# Patient Record
Sex: Male | Born: 1965 | Race: White | Hispanic: No | Marital: Single | Smoking: Current every day smoker
Health system: Southern US, Community
[De-identification: ages and names within clinical notes are randomized; demographics above are authoritative.]

## PROBLEM LIST (undated history)

## (undated) DIAGNOSIS — K469 Unspecified abdominal hernia without obstruction or gangrene: Secondary | ICD-10-CM

## (undated) DIAGNOSIS — Z720 Tobacco use: Secondary | ICD-10-CM

## (undated) DIAGNOSIS — K7290 Hepatic failure, unspecified without coma: Secondary | ICD-10-CM

## (undated) DIAGNOSIS — R569 Unspecified convulsions: Secondary | ICD-10-CM

## (undated) DIAGNOSIS — K7460 Unspecified cirrhosis of liver: Secondary | ICD-10-CM

## (undated) DIAGNOSIS — F32A Depression, unspecified: Secondary | ICD-10-CM

## (undated) DIAGNOSIS — F319 Bipolar disorder, unspecified: Secondary | ICD-10-CM

## (undated) DIAGNOSIS — F329 Major depressive disorder, single episode, unspecified: Secondary | ICD-10-CM

## (undated) DIAGNOSIS — I619 Nontraumatic intracerebral hemorrhage, unspecified: Secondary | ICD-10-CM

## (undated) DIAGNOSIS — I639 Cerebral infarction, unspecified: Secondary | ICD-10-CM

## (undated) HISTORY — PX: SPLENECTOMY: SUR1306

## (undated) HISTORY — DX: Hepatic failure, unspecified without coma: K72.90

## (undated) HISTORY — DX: Nontraumatic intracerebral hemorrhage, unspecified: I61.9

## (undated) HISTORY — PX: ANKLE SURGERY: SHX546

## (undated) HISTORY — PX: HERNIA REPAIR: SHX51

---

## 1998-03-25 ENCOUNTER — Emergency Department (HOSPITAL_COMMUNITY): Admission: EM | Admit: 1998-03-25 | Discharge: 1998-03-25 | Payer: Self-pay | Admitting: Emergency Medicine

## 1999-08-15 ENCOUNTER — Emergency Department (HOSPITAL_COMMUNITY): Admission: EM | Admit: 1999-08-15 | Discharge: 1999-08-15 | Payer: Self-pay

## 2001-12-13 ENCOUNTER — Emergency Department (HOSPITAL_COMMUNITY): Admission: EM | Admit: 2001-12-13 | Discharge: 2001-12-13 | Payer: Self-pay | Admitting: Emergency Medicine

## 2001-12-13 ENCOUNTER — Encounter: Payer: Self-pay | Admitting: Emergency Medicine

## 2003-06-23 ENCOUNTER — Encounter: Payer: Self-pay | Admitting: Surgery

## 2003-06-23 ENCOUNTER — Inpatient Hospital Stay (HOSPITAL_COMMUNITY): Admission: EM | Admit: 2003-06-23 | Discharge: 2003-06-26 | Payer: Self-pay | Admitting: Emergency Medicine

## 2003-06-23 ENCOUNTER — Encounter (INDEPENDENT_AMBULATORY_CARE_PROVIDER_SITE_OTHER): Payer: Self-pay | Admitting: *Deleted

## 2003-06-25 ENCOUNTER — Encounter: Payer: Self-pay | Admitting: Surgery

## 2003-07-24 ENCOUNTER — Encounter (INDEPENDENT_AMBULATORY_CARE_PROVIDER_SITE_OTHER): Payer: Self-pay | Admitting: *Deleted

## 2005-10-26 ENCOUNTER — Emergency Department (HOSPITAL_COMMUNITY): Admission: EM | Admit: 2005-10-26 | Discharge: 2005-10-26 | Payer: Self-pay | Admitting: Emergency Medicine

## 2005-11-04 ENCOUNTER — Ambulatory Visit (HOSPITAL_COMMUNITY): Admission: AD | Admit: 2005-11-04 | Discharge: 2005-11-06 | Payer: Self-pay | Admitting: Orthopedic Surgery

## 2009-03-21 ENCOUNTER — Emergency Department (HOSPITAL_COMMUNITY): Admission: EM | Admit: 2009-03-21 | Discharge: 2009-03-21 | Payer: Self-pay | Admitting: Emergency Medicine

## 2009-07-19 ENCOUNTER — Emergency Department (HOSPITAL_COMMUNITY): Admission: EM | Admit: 2009-07-19 | Discharge: 2009-07-20 | Payer: Self-pay | Admitting: Emergency Medicine

## 2009-08-19 ENCOUNTER — Inpatient Hospital Stay (HOSPITAL_COMMUNITY): Admission: EM | Admit: 2009-08-19 | Discharge: 2009-08-21 | Payer: Self-pay | Admitting: Emergency Medicine

## 2009-08-19 ENCOUNTER — Encounter (INDEPENDENT_AMBULATORY_CARE_PROVIDER_SITE_OTHER): Payer: Self-pay | Admitting: *Deleted

## 2009-08-22 ENCOUNTER — Ambulatory Visit: Payer: Self-pay | Admitting: Internal Medicine

## 2009-08-23 ENCOUNTER — Encounter (INDEPENDENT_AMBULATORY_CARE_PROVIDER_SITE_OTHER): Payer: Self-pay | Admitting: *Deleted

## 2009-08-23 ENCOUNTER — Inpatient Hospital Stay (HOSPITAL_COMMUNITY): Admission: EM | Admit: 2009-08-23 | Discharge: 2009-08-29 | Payer: Self-pay | Admitting: Emergency Medicine

## 2009-08-28 ENCOUNTER — Encounter (INDEPENDENT_AMBULATORY_CARE_PROVIDER_SITE_OTHER): Payer: Self-pay | Admitting: Surgery

## 2009-09-18 ENCOUNTER — Encounter: Payer: Self-pay | Admitting: Internal Medicine

## 2010-04-10 ENCOUNTER — Emergency Department (HOSPITAL_COMMUNITY): Admission: EM | Admit: 2010-04-10 | Discharge: 2010-04-10 | Payer: Self-pay | Admitting: Emergency Medicine

## 2010-04-22 ENCOUNTER — Emergency Department (HOSPITAL_COMMUNITY): Admission: EM | Admit: 2010-04-22 | Discharge: 2010-04-22 | Payer: Self-pay | Admitting: Emergency Medicine

## 2010-04-27 ENCOUNTER — Emergency Department (HOSPITAL_COMMUNITY): Admission: EM | Admit: 2010-04-27 | Discharge: 2010-04-27 | Payer: Self-pay | Admitting: Emergency Medicine

## 2010-06-01 ENCOUNTER — Emergency Department (HOSPITAL_COMMUNITY): Admission: EM | Admit: 2010-06-01 | Discharge: 2010-06-01 | Payer: Self-pay | Admitting: Emergency Medicine

## 2011-02-10 ENCOUNTER — Emergency Department (HOSPITAL_COMMUNITY)
Admission: EM | Admit: 2011-02-10 | Discharge: 2011-02-10 | Disposition: A | Discharge status: Home / Self Care | Payer: Self-pay | Attending: Emergency Medicine | Admitting: Emergency Medicine

## 2011-02-10 ENCOUNTER — Emergency Department (HOSPITAL_COMMUNITY): Payer: Self-pay

## 2011-02-10 DIAGNOSIS — S9030XA Contusion of unspecified foot, initial encounter: Secondary | ICD-10-CM | POA: Insufficient documentation

## 2011-02-10 DIAGNOSIS — W208XXA Other cause of strike by thrown, projected or falling object, initial encounter: Secondary | ICD-10-CM | POA: Insufficient documentation

## 2011-02-20 ENCOUNTER — Emergency Department (HOSPITAL_COMMUNITY)
Admission: EM | Admit: 2011-02-20 | Discharge: 2011-02-20 | Disposition: A | Discharge status: Home / Self Care | Payer: Self-pay | Attending: Emergency Medicine | Admitting: Emergency Medicine

## 2011-02-20 ENCOUNTER — Emergency Department (HOSPITAL_COMMUNITY): Payer: Self-pay

## 2011-02-20 DIAGNOSIS — R319 Hematuria, unspecified: Secondary | ICD-10-CM | POA: Insufficient documentation

## 2011-02-20 DIAGNOSIS — S20229A Contusion of unspecified back wall of thorax, initial encounter: Secondary | ICD-10-CM | POA: Insufficient documentation

## 2011-02-20 DIAGNOSIS — W108XXA Fall (on) (from) other stairs and steps, initial encounter: Secondary | ICD-10-CM | POA: Insufficient documentation

## 2011-02-20 LAB — URINE MICROSCOPIC-ADD ON

## 2011-02-20 LAB — BASIC METABOLIC PANEL
BUN: 5 mg/dL — ABNORMAL LOW (ref 6–23)
CO2: 25 mEq/L (ref 19–32)
Calcium: 9 mg/dL (ref 8.4–10.5)
Creatinine, Ser: 0.88 mg/dL (ref 0.4–1.5)
Glucose, Bld: 79 mg/dL (ref 70–99)
Potassium: 4 mEq/L (ref 3.5–5.1)

## 2011-02-20 LAB — URINALYSIS, ROUTINE W REFLEX MICROSCOPIC
Bilirubin Urine: NEGATIVE
Glucose, UA: NEGATIVE mg/dL
Nitrite: NEGATIVE
Specific Gravity, Urine: <1.005 — ABNORMAL LOW (ref 1.005–1.030)
pH: 6 (ref 5.0–8.0)

## 2011-02-20 LAB — DIFFERENTIAL
Basophils Absolute: 0.1 10*3/uL (ref 0.0–0.1)
Lymphocytes Relative: 20 % (ref 12–46)
Monocytes Absolute: 0.7 10*3/uL (ref 0.1–1.0)
Monocytes Relative: 8 % (ref 3–12)
Neutro Abs: 6.7 10*3/uL (ref 1.7–7.7)
Neutrophils Relative %: 71 % (ref 43–77)

## 2011-02-20 LAB — CBC
Hemoglobin: 16.6 g/dL (ref 13.0–17.0)
RDW: 14.9 % (ref 11.5–15.5)
WBC: 9.4 10*3/uL (ref 4.0–10.5)

## 2011-02-23 LAB — BASIC METABOLIC PANEL
BUN: 4 mg/dL — ABNORMAL LOW (ref 6–23)
CO2: 26 mEq/L (ref 19–32)
Calcium: 8.6 mg/dL (ref 8.4–10.5)
Chloride: 103 mEq/L (ref 96–112)
Creatinine, Ser: 0.81 mg/dL (ref 0.4–1.5)
Glucose, Bld: 92 mg/dL (ref 70–99)

## 2011-02-23 LAB — DIFFERENTIAL
Basophils Absolute: 0.1 10*3/uL (ref 0.0–0.1)
Basophils Relative: 1 % (ref 0–1)
Eosinophils Absolute: 0.1 10*3/uL (ref 0.0–0.7)
Eosinophils Relative: 1 % (ref 0–5)
Lymphs Abs: 2.5 10*3/uL (ref 0.7–4.0)
Neutrophils Relative %: 61 % (ref 43–77)

## 2011-02-23 LAB — CBC
MCH: 34.6 pg — ABNORMAL HIGH (ref 26.0–34.0)
MCHC: 34.1 g/dL (ref 30.0–36.0)
MCV: 101.4 fL — ABNORMAL HIGH (ref 78.0–100.0)
Platelets: 212 10*3/uL (ref 150–400)
RDW: 17.2 % — ABNORMAL HIGH (ref 11.5–15.5)

## 2011-02-23 LAB — POCT CARDIAC MARKERS: Troponin i, poc: <0.05 ng/mL (ref 0.00–0.09)

## 2011-02-23 LAB — D-DIMER, QUANTITATIVE: D-Dimer, Quant: 2.43 ug/mL-FEU — ABNORMAL HIGH (ref 0.00–0.48)

## 2011-02-24 LAB — URINALYSIS, ROUTINE W REFLEX MICROSCOPIC
Bilirubin Urine: NEGATIVE
Glucose, UA: NEGATIVE mg/dL
Nitrite: NEGATIVE
Specific Gravity, Urine: >1.03 — ABNORMAL HIGH (ref 1.005–1.030)
pH: 5.5 (ref 5.0–8.0)

## 2011-02-26 ENCOUNTER — Emergency Department (HOSPITAL_COMMUNITY): Payer: Self-pay

## 2011-02-26 ENCOUNTER — Emergency Department (HOSPITAL_COMMUNITY)
Admission: EM | Admit: 2011-02-26 | Discharge: 2011-02-26 | Disposition: A | Discharge status: Home / Self Care | Payer: Self-pay | Attending: Emergency Medicine | Admitting: Emergency Medicine

## 2011-02-26 DIAGNOSIS — R112 Nausea with vomiting, unspecified: Secondary | ICD-10-CM | POA: Insufficient documentation

## 2011-02-26 DIAGNOSIS — R319 Hematuria, unspecified: Secondary | ICD-10-CM | POA: Insufficient documentation

## 2011-02-26 DIAGNOSIS — R509 Fever, unspecified: Secondary | ICD-10-CM | POA: Insufficient documentation

## 2011-02-26 DIAGNOSIS — N390 Urinary tract infection, site not specified: Secondary | ICD-10-CM | POA: Insufficient documentation

## 2011-02-26 DIAGNOSIS — R109 Unspecified abdominal pain: Secondary | ICD-10-CM | POA: Insufficient documentation

## 2011-02-26 DIAGNOSIS — R748 Abnormal levels of other serum enzymes: Secondary | ICD-10-CM | POA: Insufficient documentation

## 2011-02-26 DIAGNOSIS — K7689 Other specified diseases of liver: Secondary | ICD-10-CM | POA: Insufficient documentation

## 2011-02-26 DIAGNOSIS — K5900 Constipation, unspecified: Secondary | ICD-10-CM | POA: Insufficient documentation

## 2011-02-26 LAB — DIFFERENTIAL
Eosinophils Absolute: 0 10*3/uL (ref 0.0–0.7)
Eosinophils Relative: 0 % (ref 0–5)
Lymphs Abs: 2.1 10*3/uL (ref 0.7–4.0)
Monocytes Relative: 11 % (ref 3–12)

## 2011-02-26 LAB — LIPASE, BLOOD: Lipase: 49 U/L (ref 11–59)

## 2011-02-26 LAB — CBC
MCH: 34.3 pg — ABNORMAL HIGH (ref 26.0–34.0)
MCHC: 35.6 g/dL (ref 30.0–36.0)
MCV: 96.4 fL (ref 78.0–100.0)
Platelets: 120 10*3/uL — ABNORMAL LOW (ref 150–400)
RDW: 14.2 % (ref 11.5–15.5)

## 2011-02-26 LAB — URINALYSIS, ROUTINE W REFLEX MICROSCOPIC
Glucose, UA: NEGATIVE mg/dL
Protein, ur: 30 mg/dL — AB
Specific Gravity, Urine: 1.028 (ref 1.005–1.030)
Urobilinogen, UA: 1 mg/dL (ref 0.0–1.0)

## 2011-02-26 LAB — COMPREHENSIVE METABOLIC PANEL
Albumin: 3.3 g/dL — ABNORMAL LOW (ref 3.5–5.2)
BUN: 8 mg/dL (ref 6–23)
Creatinine, Ser: 0.84 mg/dL (ref 0.4–1.5)
Total Bilirubin: 1.6 mg/dL — ABNORMAL HIGH (ref 0.3–1.2)
Total Protein: 6.5 g/dL (ref 6.0–8.3)

## 2011-02-26 LAB — URINE MICROSCOPIC-ADD ON

## 2011-02-26 MED ORDER — IOHEXOL 300 MG/ML  SOLN
80.0000 mL | Freq: Once | INTRAMUSCULAR | Status: AC | PRN
  Administered 2011-02-26: 80 mL via INTRAVENOUS

## 2011-02-27 LAB — URINE CULTURE
Colony Count: 4000
Culture  Setup Time: 201203211123

## 2011-03-14 LAB — COMPREHENSIVE METABOLIC PANEL
ALT: 17 U/L (ref 0–53)
AST: 22 U/L (ref 0–37)
AST: 29 U/L (ref 0–37)
Albumin: 3.9 g/dL (ref 3.5–5.2)
Alkaline Phosphatase: 77 U/L (ref 39–117)
BUN: 11 mg/dL (ref 6–23)
CO2: 24 mEq/L (ref 19–32)
Calcium: 8.8 mg/dL (ref 8.4–10.5)
Calcium: 9.2 mg/dL (ref 8.4–10.5)
Chloride: 97 mEq/L (ref 96–112)
Chloride: 99 mEq/L (ref 96–112)
Creatinine, Ser: 0.91 mg/dL (ref 0.4–1.5)
GFR calc Af Amer: >60 mL/min (ref >60)
GFR calc Af Amer: >60 mL/min (ref >60)
GFR calc non Af Amer: >60 mL/min (ref >60)
Glucose, Bld: 88 mg/dL (ref 70–99)
Potassium: 3.4 mEq/L — ABNORMAL LOW (ref 3.5–5.1)
Sodium: 131 mEq/L — ABNORMAL LOW (ref 135–145)
Total Protein: 7.4 g/dL (ref 6.0–8.3)

## 2011-03-14 LAB — DIFFERENTIAL
Basophils Absolute: 0 10*3/uL (ref 0.0–0.1)
Basophils Absolute: 0 10*3/uL (ref 0.0–0.1)
Basophils Absolute: 0 10*3/uL (ref 0.0–0.1)
Basophils Relative: 0 % (ref 0–1)
Eosinophils Absolute: 0 10*3/uL (ref 0.0–0.7)
Eosinophils Absolute: 0 10*3/uL (ref 0.0–0.7)
Eosinophils Relative: 0 % (ref 0–5)
Eosinophils Relative: 0 % (ref 0–5)
Eosinophils Relative: 0 % (ref 0–5)
Lymphocytes Relative: 12 % (ref 12–46)
Lymphocytes Relative: 2 % — ABNORMAL LOW (ref 12–46)
Lymphocytes Relative: 5 % — ABNORMAL LOW (ref 12–46)
Lymphs Abs: 2.1 10*3/uL (ref 0.7–4.0)
Lymphs Abs: 0.5 10*3/uL — ABNORMAL LOW (ref 0.7–4.0)
Lymphs Abs: 1.1 10*3/uL (ref 0.7–4.0)
Monocytes Absolute: 0.5 10*3/uL (ref 0.1–1.0)
Monocytes Absolute: 1 10*3/uL (ref 0.1–1.0)
Monocytes Relative: 16 % — ABNORMAL HIGH (ref 3–12)
Monocytes Relative: 11 % (ref 3–12)
Monocytes Relative: 5 % (ref 3–12)
Monocytes Relative: 5 % (ref 3–12)
Neutro Abs: 10.3 10*3/uL — ABNORMAL HIGH (ref 1.7–7.7)
Neutro Abs: 18.3 10*3/uL — ABNORMAL HIGH (ref 1.7–7.7)
Neutrophils Relative %: 70 % (ref 43–77)
Neutrophils Relative %: 92 % — ABNORMAL HIGH (ref 43–77)

## 2011-03-14 LAB — CBC
HCT: 18.7 % — ABNORMAL LOW (ref 39.0–52.0)
HCT: 19.4 % — ABNORMAL LOW (ref 39.0–52.0)
HCT: 21 % — ABNORMAL LOW (ref 39.0–52.0)
HCT: 21.3 % — ABNORMAL LOW (ref 39.0–52.0)
HCT: 21.6 % — ABNORMAL LOW (ref 39.0–52.0)
HCT: 46.8 % (ref 39.0–52.0)
HCT: 47 % (ref 39.0–52.0)
Hemoglobin: 6.5 g/dL — CL (ref 13.0–17.0)
Hemoglobin: 6.8 g/dL — CL (ref 13.0–17.0)
Hemoglobin: 7 g/dL — CL (ref 13.0–17.0)
Hemoglobin: 7.2 g/dL — CL (ref 13.0–17.0)
Hemoglobin: 8.9 g/dL — ABNORMAL LOW (ref 13.0–17.0)
Hemoglobin: 14.7 g/dL (ref 13.0–17.0)
MCHC: 33.8 g/dL (ref 30.0–36.0)
MCHC: 34.3 g/dL (ref 30.0–36.0)
MCHC: 34.7 g/dL (ref 30.0–36.0)
MCHC: 34.8 g/dL (ref 30.0–36.0)
MCHC: 34.8 g/dL (ref 30.0–36.0)
MCV: 93.7 fL (ref 78.0–100.0)
MCV: 94.3 fL (ref 78.0–100.0)
MCV: 94.8 fL (ref 78.0–100.0)
MCV: 95 fL (ref 78.0–100.0)
MCV: 96.4 fL (ref 78.0–100.0)
MCV: 97 fL (ref 78.0–100.0)
MCV: 95.4 fL (ref 78.0–100.0)
Platelets: 123 10*3/uL — ABNORMAL LOW (ref 150–400)
Platelets: 162 10*3/uL (ref 150–400)
Platelets: 241 10*3/uL (ref 150–400)
Platelets: 275 10*3/uL (ref 150–400)
Platelets: 326 10*3/uL (ref 150–400)
Platelets: 646 10*3/uL — ABNORMAL HIGH (ref 150–400)
Platelets: 75 10*3/uL — ABNORMAL LOW (ref 150–400)
Platelets: 95 10*3/uL — ABNORMAL LOW (ref 150–400)
RBC: 2.08 MIL/uL — ABNORMAL LOW (ref 4.22–5.81)
RBC: 2.24 MIL/uL — ABNORMAL LOW (ref 4.22–5.81)
RBC: 2.26 MIL/uL — ABNORMAL LOW (ref 4.22–5.81)
RBC: 2.7 MIL/uL — ABNORMAL LOW (ref 4.22–5.81)
RBC: 4.37 MIL/uL (ref 4.22–5.81)
RBC: 4.84 MIL/uL (ref 4.22–5.81)
RDW: 13.9 % (ref 11.5–15.5)
RDW: 14.1 % (ref 11.5–15.5)
RDW: 14.5 % (ref 11.5–15.5)
RDW: 14.2 % (ref 11.5–15.5)
WBC: 13.1 10*3/uL — ABNORMAL HIGH (ref 4.0–10.5)
WBC: 15.2 10*3/uL — ABNORMAL HIGH (ref 4.0–10.5)
WBC: 15.7 10*3/uL — ABNORMAL HIGH (ref 4.0–10.5)
WBC: 19.4 10*3/uL — ABNORMAL HIGH (ref 4.0–10.5)
WBC: 19.9 10*3/uL — ABNORMAL HIGH (ref 4.0–10.5)
WBC: 20.4 10*3/uL — ABNORMAL HIGH (ref 4.0–10.5)
WBC: 11.4 10*3/uL — ABNORMAL HIGH (ref 4.0–10.5)
WBC: 8.8 10*3/uL (ref 4.0–10.5)

## 2011-03-14 LAB — CULTURE, BLOOD (ROUTINE X 2): Culture: NO GROWTH

## 2011-03-14 LAB — BASIC METABOLIC PANEL
BUN: 5 mg/dL — ABNORMAL LOW (ref 6–23)
CO2: 21 mEq/L (ref 19–32)
Chloride: 105 mEq/L (ref 96–112)
Chloride: 106 mEq/L (ref 96–112)
Chloride: 98 mEq/L (ref 96–112)
GFR calc Af Amer: >60 mL/min (ref >60)
GFR calc Af Amer: >60 mL/min (ref >60)
Potassium: 3.7 mEq/L (ref 3.5–5.1)
Potassium: 4.4 mEq/L (ref 3.5–5.1)
Sodium: 132 mEq/L — ABNORMAL LOW (ref 135–145)
Sodium: 135 mEq/L (ref 135–145)

## 2011-03-14 LAB — PROTIME-INR
INR: 1.1 (ref 0.00–1.49)
Prothrombin Time: 13.6 seconds (ref 11.6–15.2)
Prothrombin Time: 14.2 seconds (ref 11.6–15.2)

## 2011-03-14 LAB — CROSSMATCH: Antibody Screen: NEGATIVE

## 2011-03-14 LAB — LIPID PANEL
Triglycerides: 59 mg/dL (ref <150)
VLDL: 12 mg/dL (ref 0–40)

## 2011-03-14 LAB — POCT I-STAT, CHEM 8
BUN: 12 mg/dL (ref 6–23)
Calcium, Ion: 1.11 mmol/L — ABNORMAL LOW (ref 1.12–1.32)
Chloride: 100 mEq/L (ref 96–112)
Creatinine, Ser: 1 mg/dL (ref 0.4–1.5)
Creatinine, Ser: 1.1 mg/dL (ref 0.4–1.5)
Glucose, Bld: 125 mg/dL — ABNORMAL HIGH (ref 70–99)
Glucose, Bld: 135 mg/dL — ABNORMAL HIGH (ref 70–99)
HCT: 52 % (ref 39.0–52.0)
Hemoglobin: 15.3 g/dL (ref 13.0–17.0)
Hemoglobin: 17.7 g/dL — ABNORMAL HIGH (ref 13.0–17.0)
Potassium: 3.7 mEq/L (ref 3.5–5.1)
Potassium: 3.9 meq/L (ref 3.5–5.1)
Sodium: 133 meq/L — ABNORMAL LOW (ref 135–145)
TCO2: 23 mmol/L (ref 0–100)

## 2011-03-14 LAB — URINALYSIS, ROUTINE W REFLEX MICROSCOPIC
Bilirubin Urine: NEGATIVE
Glucose, UA: NEGATIVE mg/dL
Ketones, ur: 15 mg/dL — AB
Nitrite: NEGATIVE
Nitrite: POSITIVE — AB
Protein, ur: NEGATIVE mg/dL
Specific Gravity, Urine: 1.035 — ABNORMAL HIGH (ref 1.005–1.030)
pH: 5.5 (ref 5.0–8.0)

## 2011-03-14 LAB — HEPATIC FUNCTION PANEL
ALT: 17 U/L (ref 0–53)
AST: 21 U/L (ref 0–37)
Bilirubin, Direct: 0.3 mg/dL (ref 0.0–0.3)
Total Bilirubin: 0.9 mg/dL (ref 0.3–1.2)

## 2011-03-14 LAB — URINE CULTURE: Culture: NO GROWTH

## 2011-03-14 LAB — POTASSIUM: Potassium: 4.1 mEq/L (ref 3.5–5.1)

## 2011-03-14 LAB — APTT: aPTT: 28 seconds (ref 24–37)

## 2011-03-14 LAB — URINE MICROSCOPIC-ADD ON

## 2011-03-14 LAB — PHOSPHORUS: Phosphorus: 3.7 mg/dL (ref 2.3–4.6)

## 2011-03-14 LAB — HEMOGLOBIN AND HEMATOCRIT, BLOOD: HCT: 21 % — ABNORMAL LOW (ref 39.0–52.0)

## 2011-03-19 LAB — URINALYSIS, ROUTINE W REFLEX MICROSCOPIC
Glucose, UA: NEGATIVE mg/dL
Hgb urine dipstick: NEGATIVE
Ketones, ur: 40 mg/dL — AB
Protein, ur: NEGATIVE mg/dL

## 2011-03-27 ENCOUNTER — Emergency Department (HOSPITAL_COMMUNITY): Payer: Self-pay

## 2011-03-27 ENCOUNTER — Emergency Department (HOSPITAL_COMMUNITY)
Admission: EM | Admit: 2011-03-27 | Discharge: 2011-03-27 | Disposition: A | Discharge status: Home / Self Care | Payer: Self-pay | Attending: Emergency Medicine | Admitting: Emergency Medicine

## 2011-03-27 DIAGNOSIS — K7460 Unspecified cirrhosis of liver: Secondary | ICD-10-CM | POA: Insufficient documentation

## 2011-03-27 DIAGNOSIS — Z87442 Personal history of urinary calculi: Secondary | ICD-10-CM | POA: Insufficient documentation

## 2011-03-27 DIAGNOSIS — F1020 Alcohol dependence, uncomplicated: Secondary | ICD-10-CM | POA: Insufficient documentation

## 2011-03-27 DIAGNOSIS — R5381 Other malaise: Secondary | ICD-10-CM | POA: Insufficient documentation

## 2011-03-27 LAB — CBC
HCT: 49.3 % (ref 39.0–52.0)
MCH: 34.9 pg — ABNORMAL HIGH (ref 26.0–34.0)
MCHC: 35.9 g/dL (ref 30.0–36.0)
MCV: 97.2 fL (ref 78.0–100.0)
RDW: 15.2 % (ref 11.5–15.5)

## 2011-03-27 LAB — BASIC METABOLIC PANEL
BUN: 4 mg/dL — ABNORMAL LOW (ref 6–23)
Calcium: 8.7 mg/dL (ref 8.4–10.5)
Creatinine, Ser: 0.77 mg/dL (ref 0.4–1.5)
GFR calc non Af Amer: >60 mL/min (ref >60)
Glucose, Bld: 109 mg/dL — ABNORMAL HIGH (ref 70–99)

## 2011-04-03 ENCOUNTER — Ambulatory Visit (HOSPITAL_COMMUNITY): Payer: Self-pay

## 2011-04-08 ENCOUNTER — Ambulatory Visit (HOSPITAL_COMMUNITY)
Admission: RE | Admit: 2011-04-08 | Discharge: 2011-04-08 | Disposition: A | Discharge status: Home / Self Care | Payer: Self-pay | Source: Ambulatory Visit | Attending: Neurology | Admitting: Neurology

## 2011-04-16 ENCOUNTER — Ambulatory Visit (HOSPITAL_COMMUNITY)
Admission: RE | Admit: 2011-04-16 | Discharge: 2011-04-16 | Disposition: A | Discharge status: Home / Self Care | Payer: Self-pay | Source: Ambulatory Visit | Attending: Neurology | Admitting: Neurology

## 2011-04-16 DIAGNOSIS — M6281 Muscle weakness (generalized): Secondary | ICD-10-CM | POA: Insufficient documentation

## 2011-04-16 DIAGNOSIS — R4789 Other speech disturbances: Secondary | ICD-10-CM | POA: Insufficient documentation

## 2011-04-16 DIAGNOSIS — Z1389 Encounter for screening for other disorder: Secondary | ICD-10-CM | POA: Insufficient documentation

## 2011-04-16 DIAGNOSIS — R404 Transient alteration of awareness: Secondary | ICD-10-CM | POA: Insufficient documentation

## 2011-04-17 NOTE — Procedures (Signed)
NAME:  Nathaniel Warren, Nathaniel Warren               ACCOUNT NO.:  [id number]  MEDICAL RECORD NO.:  [id number]           PATIENT TYPE:  O  LOCATION:  EE                           FACILITY:  [facility]  PHYSICIAN:  Vinayak Bobier A. Gerilyn Pilgrim, M.D. DATE OF BIRTH:  [birth year]  DATE OF PROCEDURE: DATE OF DISCHARGE:                             EEG INTERPRETATION   REFERRING PHYSICIAN:  Romel Dumond A. Athanasios Heldman, MD  INDICATIONS:  A 45 year old man who presents with spells of right-sided weakness, slurred speech and altered mentation.  This study is being done to evaluate for seizures.  MEDICATIONS:  None.  ANALYSIS:  A 16-channel recording is conducted for 21 minutes.  There is a well-formed posterior dominant rhythm of 12 Hz which attenuates with eye opening.  There is only awake activities observed.  There is beta activity observed in the frontal areas.  Photic stimulation and hypoventilation are carried out without abnormal changes in the background activity.  There is no focal or lateralized slowing.  There is no epileptiform activity observed.  IMPRESSION:  Normal recording in awake state.     Fischer Halley A. Gerilyn Pilgrim, M.D.     KAD/MEDQ  D:  04/17/2011  T:  04/17/2011  Job:  [id number]

## 2011-04-19 ENCOUNTER — Emergency Department (HOSPITAL_COMMUNITY)
Admission: EM | Admit: 2011-04-19 | Discharge: 2011-04-19 | Disposition: A | Discharge status: Home / Self Care | Payer: Self-pay | Attending: Emergency Medicine | Admitting: Emergency Medicine

## 2011-04-19 DIAGNOSIS — IMO0002 Reserved for concepts with insufficient information to code with codable children: Secondary | ICD-10-CM | POA: Insufficient documentation

## 2011-04-19 DIAGNOSIS — K7030 Alcoholic cirrhosis of liver without ascites: Secondary | ICD-10-CM | POA: Insufficient documentation

## 2011-04-19 DIAGNOSIS — F17200 Nicotine dependence, unspecified, uncomplicated: Secondary | ICD-10-CM | POA: Insufficient documentation

## 2011-04-19 DIAGNOSIS — F1020 Alcohol dependence, uncomplicated: Secondary | ICD-10-CM | POA: Insufficient documentation

## 2011-04-20 ENCOUNTER — Ambulatory Visit (HOSPITAL_COMMUNITY)
Admit: 2011-04-20 | Discharge: 2011-04-20 | Disposition: A | Discharge status: Home / Self Care | Payer: Self-pay | Source: Ambulatory Visit | Attending: Emergency Medicine | Admitting: Emergency Medicine

## 2011-04-20 DIAGNOSIS — M7989 Other specified soft tissue disorders: Secondary | ICD-10-CM | POA: Insufficient documentation

## 2011-04-20 DIAGNOSIS — M79609 Pain in unspecified limb: Secondary | ICD-10-CM | POA: Insufficient documentation

## 2011-04-20 DIAGNOSIS — I808 Phlebitis and thrombophlebitis of other sites: Secondary | ICD-10-CM | POA: Insufficient documentation

## 2011-04-22 ENCOUNTER — Emergency Department (HOSPITAL_COMMUNITY): Payer: Self-pay

## 2011-04-22 ENCOUNTER — Emergency Department (HOSPITAL_COMMUNITY)
Admission: EM | Admit: 2011-04-22 | Discharge: 2011-04-22 | Disposition: A | Discharge status: Home / Self Care | Payer: Self-pay | Attending: Emergency Medicine | Admitting: Emergency Medicine

## 2011-04-22 DIAGNOSIS — R5381 Other malaise: Secondary | ICD-10-CM | POA: Insufficient documentation

## 2011-04-22 DIAGNOSIS — R569 Unspecified convulsions: Secondary | ICD-10-CM | POA: Insufficient documentation

## 2011-04-22 DIAGNOSIS — K7460 Unspecified cirrhosis of liver: Secondary | ICD-10-CM | POA: Insufficient documentation

## 2011-04-22 DIAGNOSIS — F10229 Alcohol dependence with intoxication, unspecified: Secondary | ICD-10-CM | POA: Insufficient documentation

## 2011-04-22 DIAGNOSIS — Z79899 Other long term (current) drug therapy: Secondary | ICD-10-CM | POA: Insufficient documentation

## 2011-04-22 DIAGNOSIS — Z7982 Long term (current) use of aspirin: Secondary | ICD-10-CM | POA: Insufficient documentation

## 2011-04-22 DIAGNOSIS — R5383 Other fatigue: Secondary | ICD-10-CM | POA: Insufficient documentation

## 2011-04-22 LAB — HEPATIC FUNCTION PANEL
Bilirubin, Direct: 0.2 mg/dL (ref 0.0–0.3)
Indirect Bilirubin: 0.2 mg/dL — ABNORMAL LOW (ref 0.3–0.9)

## 2011-04-22 LAB — BASIC METABOLIC PANEL
BUN: 4 mg/dL — ABNORMAL LOW (ref 6–23)
Calcium: 9.7 mg/dL (ref 8.4–10.5)
GFR calc non Af Amer: >60 mL/min (ref >60)
Glucose, Bld: 100 mg/dL — ABNORMAL HIGH (ref 70–99)
Sodium: 137 mEq/L (ref 135–145)

## 2011-04-22 LAB — CBC
HCT: 50.5 % (ref 39.0–52.0)
Hemoglobin: 17.9 g/dL — ABNORMAL HIGH (ref 13.0–17.0)
RBC: 5.08 MIL/uL (ref 4.22–5.81)
WBC: 9 10*3/uL (ref 4.0–10.5)

## 2011-04-22 LAB — DIFFERENTIAL
Basophils Absolute: 0 10*3/uL (ref 0.0–0.1)
Lymphocytes Relative: 39 % (ref 12–46)
Neutro Abs: 4.7 10*3/uL (ref 1.7–7.7)

## 2011-04-22 LAB — RAPID URINE DRUG SCREEN, HOSP PERFORMED
Amphetamines: NOT DETECTED
Cocaine: NOT DETECTED
Opiates: NOT DETECTED
Tetrahydrocannabinol: NOT DETECTED

## 2011-04-22 LAB — ETHANOL: Alcohol, Ethyl (B): 320 mg/dL — ABNORMAL HIGH (ref 0–10)

## 2011-04-25 NOTE — Discharge Summary (Signed)
   NAMEBRODERIC, BARA NO.:  [id number]   MEDICAL RECORD NO.:  [id number]                   PATIENT TYPE:  INP   LOCATION:  0468                                 FACILITY:  [facility]   PHYSICIAN:  Sandria Bales. Ezzard Standing, M.D.               DATE OF BIRTH:  Dec 06, 1966   DATE OF ADMISSION:  06/23/2003  DATE OF DISCHARGE:  06/26/2003                                 DISCHARGE SUMMARY   No dictation for this job.                                               Sandria Bales. Ezzard Standing, M.D.    DHN/MEDQ  D:  06/26/2003  T:  06/26/2003  Job:  [id number]

## 2011-04-25 NOTE — H&P (Signed)
Nathaniel Warren, Nathaniel [name]:  [id number]   MEDICAL RECORD NO.:  [id number]                   PATIENT TYPE:  INP   LOCATION:  0468                                 FACILITY:  [facility]   PHYSICIAN:  Sandria Bales. Ezzard Standing, M.D.               DATE OF BIRTH:  Mar 13, 1966   DATE OF ADMISSION:  06/23/2003  DATE OF DISCHARGE:                                HISTORY & PHYSICAL   HISTORY OF PRESENT ILLNESS:  This is a 45 year old white male who has no  identified primary medical doctor.  He was in his normal state of health  last night when he ate some Hamburger Helper and some other food and started  feeling bloated.  He has had increase in bloating, had abdominal discomfort.  This morning he went to [facility].  He has had no nausea or  vomiting.  He says he had a normal bowel movement this morning.  He has not  been febrile.  He denies any prior history of peptic ulcer disease, liver  disease, pancreatic disease, or colon disease.  He has had no prior  abdominal surgery except for a right inguinal hernia repair at the age of  nine months.   ALLERGIES:  ASPIRIN.   CURRENT MEDICATIONS:  None.   REVIEW OF SYSTEMS:  NEUROLOGICAL:  No history of loss of consciousness.  PULMONARY:  He smokes 1 to 1-1/2 packs a day.  He knows it is bad for his  health.  He has no history of pneumonia.  CARDIAC:  No history of heart  disease, chest pain, or hypertension.  GASTROINTESTINAL:  See history of  present illness.  GENITOURINARY:  No significant kidney infections.  NECK:  He did, about 2-1/2 years ago, have trouble with his hyoid bone in his  right neck.  He was repeatedly treated with antibiotics about 3-4 times,  then got some gastrointestinal upset after this, and he associated his GI  upset with the several rounds of antibiotics.  He never did require any  surgery.   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  His wife is  flying in tonight to [employer] so she is not  here.   PHYSICAL EXAMINATION:  VITAL SIGNS:  Temperature 99.2, blood pressure  143/94, pulse 105, respirations 20.  GENERAL:  He is a well-nourished white male, alert and cooperative on  physical examination.  HEENT:  Unremarkable.  His pupils are equal, round, and reactive to light.  His mouth shows no obvious oral lesions.  NECK:  Supple.  No masses or thyromegaly.  LUNGS:  Clear to auscultation.  He has pretty good breath sounds.  He  actually has pretty good expiratory effort.  HEART:  Regular rate and rhythm.  I hear no murmur or rub.  ABDOMEN:  Shows decreased, near absent, bowel sounds.  He is moderately  distended.  He is bloated, but he did not really have any tenderness or  rebound though he says he is uncomfortable.  I do not feel any hernia, do  not feel any organomegaly or abdominal mass.  GENITOURINARY:  Unremarkable.  EXTREMITIES:  Good strength in all four extremities.  NEUROLOGICAL:  Grossly intact.   LABORATORIES:  The laboratories that I have at the time of this dictation  show white blood count 18,000, hemoglobin 16.9, hematocrit 48, platelet  count 163,000.  Sodium 139, potassium 4.2, chloride 107, CO2 23, glucose  123.  Alkaline phosphatase 93, amylase 265, lipase 186.   IMPRESSION:  My impression is that, with his mildly elevated lipase and  amylase, I wonder if Mr. Mika is not having some mild to moderate  pancreatitis.  He did drink some beers last night.  He has no history of  gallstones.  So, my impression is that of possible pancreatitis.   PLAN:  Obtain CT scan of the abdomen and pelvis, admit him, keep him n.p.o.,  and repeat his laboratories.  I have discussed with him my findings that, if  he has pancreatitis, the potential cause primarily would be the gallstones  or alcohol intake.  He may need further diagnostic testing if the CT scan is  incomplete.  I think he understands all this.  He is going  to try to arrange  to have his wife picked up tonight.                                               Sandria Bales. Ezzard Standing, M.D.    DHN/MEDQ  D:  06/23/2003  T:  06/23/2003  Job:  [id number]   cc:   Aneta Mins B. Land, P.A.-C  [facility]

## 2011-04-25 NOTE — Op Note (Signed)
NAMEJAYLEEN, AFONSO NO.:  [id number]   MEDICAL RECORD NO.:  [id number]          PATIENT TYPE:  INP   LOCATION:  0098                         FACILITY:  [facility]   PHYSICIAN:  Almedia Balls. Ranell Patrick, M.D. DATE OF BIRTH:  07/07/1966   DATE OF PROCEDURE:  11/04/2005  DATE OF DISCHARGE:                                 OPERATIVE REPORT   PREOPERATIVE DIAGNOSES:  Right ankle bimalleolar ankle fracture.   POSTOPERATIVE DIAGNOSES:  Right ankle bimalleolar ankle fracture.   PROCEDURE:  Open reduction and internal fixation of right bimalleolar ankle  fracture.   SURGEON:  Almedia Balls. Ranell Patrick, M.D.   FIRST ASSISTANT:  Alphonsa Overall, P.A.-C.   ANESTHESIA:  General anesthesia was used.   ESTIMATED BLOOD LOSS:  Minimal.   TOURNIQUET TIME:  1 hour and 20 minutes.   INSTRUMENT COUNTS:  Correct.   COMPLICATIONS:  None.   ANTIBIOTICS:  Perioperative antibiotics were given.   INDICATIONS FOR PROCEDURE:  The patient is a 45 year old male with a history  of a fall on a trampoline twisting his right ankle. The patient presented at  [facility] emergency room with a grossly unstable right ankle. The patient  had an obvious bimalleolar ankle fracture dislocation requiring urgent  reduction under an ankle block. The patient now presents after two weeks of  watching his skin. He developed significant swelling preventing surgery and  blisters. Once the blisters had resolved and the swelling was down, we were  able to safely bring him to surgery which is today. Informed consent was  obtained.   DESCRIPTION OF PROCEDURE:  After an adequate level of anesthesia was  achieved, the patient was positioned supinely on the operating room table. A  mini fluoroscopy unit was utilized during the surgery. After sterile prep  and drape of the right leg, we exsanguinated the limb using an esmarch  bandage and elevated the tourniquet which was on the right proximal thigh to  300 mmHg. A longitudinal  skin incision was created over the subcutaneous  fibula, dissection was carried sharply down through the subcutaneous tissues  to the bone in a single layer creating nice full thickness soft tissue  flaps. The fracture site was identified and curetted free of any organizing  hematoma. We identified the anterior joint and placed a retractor in the  anterior joint so we could see the anterolateral gutter and clean that out  of any debris and organizing hematoma and flushed the ankle joint out very  well. We were able to easily reduce the fracture without undue tension and  placed a crab claw clamp on that. This was an oblique fracture that yielded  two anterior to posterior lag screws. Once those screws were in place with  anatomic reduction, it was verified under multiplanar fluoroscopy. We placed  a lateral antiglide plate with two fully threaded cancellous in the distal  fragment and three bicortical screws in the proximal fragment. Once the  lateral side was done, we thoroughly irrigated and closed using interrupted  2-0 Vicryl suture followed by staples. We then placed our attention towards  the medial  aspect of the ankle joint where we made a curvilinear skin  incision, identified the saphenous vein and protected that. We then  identified the fracture site again cleaning periosteum and soft tissue out  of the fracture site. Extensive soft tissue __________ was identified and it  was removed. We next identified the anterior aspect of the ankle joint and  the anterior medial gutter and cleaned that of all organizing hematoma and  flushed the joint real well. The cartilage looked to be in good condition on  the medial portion of the talus. Possibly one small scuff on that from the  injury but otherwise looked pretty good. We then reduced the fracture,  placed two K wires to provisionally hold the medial malleolus in place and  were very happy with the reduction on AP and lateral views. We  then placed  two 4.0 partially threaded cancellous screws gaining excellent purchase and  good compression. The final x-rays were obtained. We thoroughly irrigated  the medial side wound and then closed it using interrupted 2-0 Vicryl suture  followed by staples for the skin. The patient tolerated surgery well and was  taken to the recovery room in stable condition.           ______________________________  Almedia Balls Ranell Patrick, M.D.     SRN/MEDQ  D:  11/04/2005  T:  11/05/2005  Job:  [id number]

## 2011-04-25 NOTE — Discharge Summary (Signed)
NAMEQUINLAN, [name]:  [id number]   MEDICAL RECORD NO.:  [id number]          PATIENT TYPE:  INP   LOCATION:  1507                         FACILITY:  [facility]   PHYSICIAN:  Almedia Balls. Ranell Patrick, M.D. DATE OF BIRTH:  22-Oct-1966   DATE OF ADMISSION:  11/04/2005  DATE OF DISCHARGE:  11/06/2005                                 DISCHARGE SUMMARY   ADMISSION DIAGNOSIS:  Right bimalleolar ankle fracture.   DISCHARGE DIAGNOSIS:  Right bimalleolar ankle fracture status post open  reduction internal fixation.   BRIEF HISTORY:  The patient is a 45 year old male that was jumping on a  trampoline and fell.  He sustained a bimalleolar ankle fracture.  It was too  swollen to complete surgery on the date of injury.  Thus we waited a week  for the swelling to recede.  He comes in for his procedure currently.   PROCEDURE:  The patient had a right ankle ORIF for a bimalleolar fracture on  November 04, 2005.   ATTENDING SURGEON:  Malon Kindle, M.D.   ASSISTANT:  Standley Dakins P.A.-C.   ESTIMATED BLOOD LOSS:  Minimal.   TOURNIQUET TIME:  One hour and 57 minutes.   COMPLICATIONS:  No complications and peri-operative antibiotics were given.   HOSPITAL COURSE:  The patient was admitted for the above stated procedure on  October 27, 2005.  The patient tolerated the procedure well and after  adequate time in the post-anesthesia care unit, he was transferred up to the  orthopedic floor.  On postoperative day one the patient was complaining  about moderate pain to that right ankle.  Thus it was determined that  discharge would not be prudent.  Thus he was allowed to stay in the  hospital, work with physical therapy, and undergo some pain management with  IV medications.  On postoperative day two the patient was feeling much  better.  He states that he is ready to go home.  We will see the patient  back in about two weeks in the office.  He is to be non-weightbearing on  that right  leg.   ACTIVITY:  Non-weightbearing right leg with crutches.   DISCHARGE MEDICATIONS:  The patient is to be discharged home on:  1.  Percocet 5/325 one tablet q4-6h. p.r.n. pain.  2.  Robaxin 500 mg one tablet q.6h. p.r.n. spasm.   FOLLOW UP:  The patient is to follow up with Dr. Malon Kindle in two weeks.   DIET:  Regular.   CONDITION:  Stable.   ALLERGIES:  NO KNOWN DRUG ALLERGIES.      Thomas B. Dixon, P.A.    ______________________________  Almedia Balls. Ranell Patrick, M.D.    TBD/MEDQ  D:  11/06/2005  T:  11/06/2005  Job:  [id number]

## 2011-04-25 NOTE — Discharge Summary (Signed)
   NAMELENIX, KIDD NO.:  [id number]   MEDICAL RECORD NO.:  [id number]                   PATIENT TYPE:  INP   LOCATION:  0468                                 FACILITY:  [facility]   PHYSICIAN:  Sandria Bales. Ezzard Standing, M.D.               DATE OF BIRTH:  [birth year]   DATE OF ADMISSION:  06/23/2003  DATE OF DISCHARGE:  06/26/2003                                 DISCHARGE SUMMARY   This is the second time this has been dictated.   DISCHARGE DIAGNOSES:  1. Pancreatitis.  2. History of smoking.   OPERATIONS PERFORMED:  None.   HISTORY OF ILLNESS:  The patient is a 45 year old white male who has no  primary medical physician saw Harlon Ditty at [facility] for  increasing abdominal pain and distention which began the evening of June 22, 2003.  He had also had three beers and was eating Hamburger Helper; he had  no prior GI symptoms.   He presented with a physical exam with a temperature of 99.2, blood pressure  143/94, pulse of 105, he was distended with some mild tenderness in his left  upper quadrant, decreased bowel sounds.  His white blood count was 18,000,  amylase 265, lipase of 186.   A CT scan obtained revealed evidence of pancreatitis but no fluid collection  or necrosis, no evidence of gallstones.   His amylase and lipase rapidly returned to normal.  He did run a low-grade  temperature of 101.7 second hospital day.  By the third day though in the  hospital he was afebrile, his white blood count was down to 12,500, his  ultrasound of his gallbladder was negative, he had no evidence of gallstones  and he was ready for discharge.   It was felt he had an idiopathic cause of his pancreatitis though it could  have been due to the beer that he had drank the night before coming in.  He  had no evidence of gallstones.  He was ready for discharge.   DISCHARGE INSTRUCTIONS INCLUDED:  He was given Vicodin for pain.  He was to  be on a low fat  diet and avoid alcohol.  He was going to see me back in the  office in 2-3 weeks for followup.                                               Sandria Bales. Ezzard Standing, M.D.    DHN/MEDQ  D:  07/24/2003  T:  07/24/2003  Job:  [id number]   cc:   Harlon Ditty, M.D.  [facility]

## 2013-01-08 ENCOUNTER — Encounter (HOSPITAL_COMMUNITY): Payer: Self-pay | Admitting: *Deleted

## 2013-01-08 ENCOUNTER — Emergency Department (HOSPITAL_COMMUNITY)
Admission: EM | Admit: 2013-01-08 | Discharge: 2013-01-09 | Disposition: A | Discharge status: Other Acute Inpatient Hospital | Payer: Self-pay | Attending: Dermatology | Admitting: Dermatology

## 2013-01-08 DIAGNOSIS — Z87828 Personal history of other (healed) physical injury and trauma: Secondary | ICD-10-CM | POA: Insufficient documentation

## 2013-01-08 DIAGNOSIS — F410 Panic disorder [episodic paroxysmal anxiety] without agoraphobia: Secondary | ICD-10-CM | POA: Insufficient documentation

## 2013-01-08 DIAGNOSIS — Z8673 Personal history of transient ischemic attack (TIA), and cerebral infarction without residual deficits: Secondary | ICD-10-CM | POA: Insufficient documentation

## 2013-01-08 DIAGNOSIS — R45851 Suicidal ideations: Secondary | ICD-10-CM | POA: Insufficient documentation

## 2013-01-08 DIAGNOSIS — F411 Generalized anxiety disorder: Secondary | ICD-10-CM | POA: Insufficient documentation

## 2013-01-08 DIAGNOSIS — F17200 Nicotine dependence, unspecified, uncomplicated: Secondary | ICD-10-CM | POA: Insufficient documentation

## 2013-01-08 DIAGNOSIS — Z9889 Other specified postprocedural states: Secondary | ICD-10-CM | POA: Insufficient documentation

## 2013-01-08 DIAGNOSIS — IMO0002 Reserved for concepts with insufficient information to code with codable children: Secondary | ICD-10-CM | POA: Insufficient documentation

## 2013-01-08 HISTORY — DX: Cerebral infarction, unspecified: I63.9

## 2013-01-08 LAB — CBC WITH DIFFERENTIAL/PLATELET
Eosinophils Relative: 0 % (ref 0–5)
HCT: 48.4 % (ref 39.0–52.0)
Hemoglobin: 17.7 g/dL — ABNORMAL HIGH (ref 13.0–17.0)
Lymphocytes Relative: 22 % (ref 12–46)
Lymphs Abs: 2.5 10*3/uL (ref 0.7–4.0)
MCV: 99.2 fL (ref 78.0–100.0)
Monocytes Absolute: 1.2 10*3/uL — ABNORMAL HIGH (ref 0.1–1.0)
Platelets: 147 10*3/uL — ABNORMAL LOW (ref 150–400)
RBC: 4.88 MIL/uL (ref 4.22–5.81)
WBC: 11.4 10*3/uL — ABNORMAL HIGH (ref 4.0–10.5)

## 2013-01-08 LAB — COMPREHENSIVE METABOLIC PANEL
ALT: 16 U/L (ref 0–53)
Alkaline Phosphatase: 91 U/L (ref 39–117)
CO2: 24 mEq/L (ref 19–32)
Calcium: 9.6 mg/dL (ref 8.4–10.5)
GFR calc Af Amer: >90 mL/min (ref >90)
GFR calc non Af Amer: >90 mL/min (ref >90)
Glucose, Bld: 90 mg/dL (ref 70–99)
Sodium: 135 mEq/L (ref 135–145)

## 2013-01-08 LAB — RAPID URINE DRUG SCREEN, HOSP PERFORMED: Benzodiazepines: NOT DETECTED

## 2013-01-08 LAB — SALICYLATE LEVEL: Salicylate Lvl: <2 mg/dL — ABNORMAL LOW (ref 2.8–20.0)

## 2013-01-08 LAB — ETHANOL: Alcohol, Ethyl (B): <11 mg/dL (ref 0–11)

## 2013-01-08 LAB — ACETAMINOPHEN LEVEL: Acetaminophen (Tylenol), Serum: <15 ug/mL (ref 10–30)

## 2013-01-08 MED ORDER — ONDANSETRON HCL 8 MG PO TABS: 4.0000 mg | ORAL_TABLET | Freq: Three times a day (TID) | ORAL | Status: DC | PRN

## 2013-01-08 MED ORDER — LORAZEPAM 2 MG/ML IJ SOLN
1.0000 mg | Freq: Once | INTRAMUSCULAR | Status: AC
  Administered 2013-01-08: 1 mg via INTRAMUSCULAR
  Filled 2013-01-08: qty 1

## 2013-01-08 MED ORDER — IBUPROFEN 200 MG PO TABS
600.0000 mg | ORAL_TABLET | Freq: Three times a day (TID) | ORAL | Status: DC | PRN
  Administered 2013-01-08 – 2013-01-09 (×2): 600 mg via ORAL
  Filled 2013-01-08 (×2): qty 3

## 2013-01-08 MED ORDER — NICOTINE 21 MG/24HR TD PT24
21.0000 mg | MEDICATED_PATCH | Freq: Every day | TRANSDERMAL | Status: DC
  Administered 2013-01-08 – 2013-01-09 (×2): 21 mg via TRANSDERMAL
  Filled 2013-01-08 (×2): qty 1

## 2013-01-08 MED ORDER — ZOLPIDEM TARTRATE 5 MG PO TABS
5.0000 mg | ORAL_TABLET | Freq: Every evening | ORAL | Status: DC | PRN
  Administered 2013-01-08: 5 mg via ORAL
  Filled 2013-01-08: qty 1

## 2013-01-08 MED ORDER — ACETAMINOPHEN 325 MG PO TABS: 650.0000 mg | ORAL_TABLET | ORAL | Status: DC | PRN

## 2013-01-08 MED ORDER — ALUM & MAG HYDROXIDE-SIMETH 200-200-20 MG/5ML PO SUSP: 30.0000 mL | ORAL | Status: DC | PRN

## 2013-01-08 NOTE — ED Notes (Signed)
Pt drinking water so he can obtain urine specimen.

## 2013-01-08 NOTE — ED Notes (Signed)
Report given to Irwin Brakeman. RN  Psychologist, forensic ordered for pt. With no sharps.

## 2013-01-08 NOTE — ED Provider Notes (Signed)
History     CSN: [id number]  Arrival date & time 01/08/13  1400   First MD Initiated Contact with Patient 01/08/13 1416      Chief Complaint  Patient presents with  . Suicidal    (Consider location/radiation/quality/duration/timing/severity/associated sxs/prior treatment) HPI  Nathaniel Warren is a 47 y.o. male complaining of suicidal ideation with a plan starting approximately 2 weeks ago. Patient is planning to freeze himself or jump off of a bridge. He's been having multiple panic attacks. He denies any prior suicide attempts. He denies any prior psychiatric diagnoses her treatment. He states that he has been very emotionally agitated secondary to pneumonic monetary concerns, family issues and issues with housing. Patient states that he is not homeless right now. He is staying at a hotel. He denies any issues with alcohol abuse, drug abuse, hallucinations either auditory or visual, or homicidal ideation. He denies any chest pain, shortness of breath, palpitations, abdominal pain, nausea or vomiting. 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  Past Medical History  Diagnosis Date  . Stroke     Past Surgical History  Procedure Date  . Hip surgery   . Ankle surgery   . Splenectomy     No family history on file.  History  Substance Use Topics  . Smoking status: Current Every Day Smoker  . Smokeless tobacco: Not on file  . Alcohol Use: Yes      Review of Systems  Constitutional: Negative for fever.  Respiratory: Negative for shortness of breath.   Cardiovascular: Negative for chest pain.  Gastrointestinal: Negative for nausea, vomiting, abdominal pain and diarrhea.  Psychiatric/Behavioral: Positive for suicidal ideas. The patient is nervous/anxious.   All other systems reviewed and are negative.    Allergies  Asa  Home Medications   Current Outpatient Rx  Name  Route  Sig  Dispense  Refill  .  IBUPROFEN 200 MG PO TABS   Oral   Take 800 mg by mouth every 6 (six) hours as needed. For pain           BP 116/87  Pulse 112  Temp 98.2 F (36.8 C) (Oral)  Resp 22  Ht 5\' 10"  (1.778 m)  Wt 160 lb (72.576 kg)  BMI 22.96 kg/m2  SpO2 97%  Physical Exam  Nursing note and vitals reviewed. Constitutional: He is oriented to person, place, and time. He appears well-developed and well-nourished. No distress.  HENT:  Head: Normocephalic and atraumatic.  Mouth/Throat: Oropharynx is clear and moist.  Eyes: Conjunctivae normal and EOM are normal. Pupils are equal, round, and reactive to light.  Neck: Normal range of motion.  Cardiovascular: Regular rhythm, normal heart sounds and intact distal pulses.        Mild tachycardia in the one teens  Pulmonary/Chest: Effort normal and breath sounds normal. No stridor. No respiratory distress. He has no wheezes. He has no rales. He exhibits no tenderness.  Abdominal: Soft. Bowel sounds are normal. He exhibits no distension and no mass. There is no tenderness. There is no rebound and no guarding.  Musculoskeletal: Normal range of motion.  Neurological: He is alert and oriented to person, place, and time.       Strength is 5 out of 5x4 extremities, distal sensation is grossly intact, patient ambulates with a coordinated gait  Skin: Skin is warm.  Psychiatric: Judgment normal. His mood appears anxious. He is agitated. Thought content is not paranoid and not delusional. Cognition  and memory are normal. He exhibits a depressed mood. He expresses suicidal ideation. He expresses no homicidal ideation. He expresses suicidal plans. He expresses no homicidal plans. He is noncommunicative.    ED Course  Procedures (including critical care time)  Labs Reviewed  CBC WITH DIFFERENTIAL - Abnormal; Notable for the following:    WBC 11.4 (*)     Hemoglobin 17.7 (*)     MCH 36.3 (*)     MCHC 36.6 (*)     Platelets 147 (*)     Monocytes Absolute 1.2 (*)      All other components within normal limits  SALICYLATE LEVEL - Abnormal; Notable for the following:    Salicylate Lvl <2.0 (*)     All other components within normal limits  COMPREHENSIVE METABOLIC PANEL  URINE RAPID DRUG SCREEN (HOSP PERFORMED)  ETHANOL  ACETAMINOPHEN LEVEL   No results found.   1. Suicidal ideation       MDM  Patient with extremely stressful and challenging life situation is depressed and has suicidal ideation with a plan. No prior attempts. Blood work, urinalysis and urine drug screen are unremarkable for any acute issues.  Patient is medically cleared for psychiatric evaluation  Act team consult for [facility] appreciated. Patient will become voluntarily committed.        Wynetta Emery, PA-C 01/08/13 2205

## 2013-01-08 NOTE — ED Notes (Signed)
Patient reports things have been "going real bad" and he is "tired of fighting it and tired of the struggle and tired of letting everyone down"  Patient states he had a plan to jump from a bridge today but he chickened out.  Patient is tearful in triage.  Patient denies any hx of si/hi.  Patient states he lost everything recently.

## 2013-01-08 NOTE — BH Assessment (Addendum)
Assessment Note   Nathaniel Warren is an 47 y.o. male.  Patient has been having extremely difficult financial challenges.  Due to pressures he has been having regarding finding a job and a place to live, patient has been having thoughts of killing himself for the last week & half.  Patient tried to drink himself to death about two nights ago because someone had told him that he could kill self by drinking a fifth of liquor in short amount of time.  Patient only passed out.  Patient also tried last night to kill self by exposing himself to elements by sleeping outside in underwear.  This did not work.  Patient today did sit on the edge of the bridge [place] [address] on [address] and almost jumped.  Patient said he was sitting on edge w/ legs over the side and someone yelled at him to stop.  He then got a ride to the [facility].  Patient relates that he and wife have been living in hotels for the last several months.  About a week ago patient got his wife to go stay with one of her daughters.  Patient said that he did that so that he could kill self without her being around.  Patient does report that he had some seizures about a year ago and the last one was September '13.  He has hx of stroke but he regained his feeling in right side and has been able to work when he can find any.  Patient reports having severe panic attacks over the last three days.  Last one was prior to coming to the ED and lasted several minutes.  Patient is fine with coming to [facility] voluntarily.  Patient has no current outpatient care.  No current HI or A/V hallucinations.  Referral made to [facility] for placement consideration. At 02:58 Ardelia Mems, RN at [facility] informed clinician that patient had been accepted to [facility] by Dr. Baron Sane to Dr. Daleen Bo.  Room 505-2. Axis I: Major Depression, single episode Axis II: Deferred Axis III:  Past Medical History  Diagnosis Date  . Stroke    Axis IV: economic problems, housing problems, occupational problems and  problems with access to health care services Axis V: 21-30 behavior considerably influenced by delusions or hallucinations OR serious impairment in judgment, communication OR inability to function in almost all areas  Past Medical History:  Past Medical History  Diagnosis Date  . Stroke     Past Surgical History  Procedure Date  . Hip surgery   . Ankle surgery   . Splenectomy     Family History: No family history on file.  Social History:  reports that he has been smoking.  He does not have any smokeless tobacco history on file. He reports that he drinks alcohol. He reports that he does not use illicit drugs.  Additional Social History:  Alcohol / Drug Use Pain Medications: Has taken keflex before for pain but has not taken in weeks. Prescriptions: N/A Over the Counter: N/A Longest period of sobriety (when/how long): Patient has been sober for 16 years.  Reports he did drink a fifth two nights ago in effort to kill self.  CIWA: CIWA-Ar BP: 124/74 mmHg Pulse Rate: 74  COWS:    Allergies:  Allergies  Allergen Reactions  . Asa (Aspirin)     High doses     Home Medications:  (Not in a hospital admission)  OB/GYN Status:  No LMP for male patient.  General Assessment  Data Location of Assessment: [facility] ED ACT Assessment: Yes Living Arrangements: Other (Comment) (Homeless, living in hotels) Can pt return to current living arrangement?: Yes Admission Status: Voluntary Is patient capable of signing voluntary admission?: Yes Transfer from: Acute Hospital Referral Source: Self/Family/Friend     Risk to self Suicidal Ideation: Yes-Currently Present Suicidal Intent: Yes-Currently Present Is patient at risk for suicide?: Yes Suicidal Plan?: Yes-Currently Present Specify Current Suicidal Plan: Jump from a bridge Access to Means: Yes Specify Access to Suicidal Means: Bridges and traffic What has been your use of drugs/alcohol within the last 12 months?: Did drink 1/5 two  nights ago in effort to kill self Previous Attempts/Gestures: Yes How many times?: 2  Other Self Harm Risks: N/A Triggers for Past Attempts: Other (Comment) (Financial difficulties) Intentional Self Injurious Behavior: None Family Suicide History: No Recent stressful life event(s): Financial Problems Persecutory voices/beliefs?: No Depression: Yes Depression Symptoms: Despondent;Insomnia;Tearfulness;Isolating;Guilt;Loss of interest in usual pleasures;Feeling worthless/self pity Substance abuse history and/or treatment for substance abuse?: Yes ([facility] for detox/rehab 16 years ago) Suicide prevention information given to non-admitted patients: Not applicable  Risk to Others Homicidal Ideation: No Thoughts of Harm to Others: No Current Homicidal Intent: No Current Homicidal Plan: No Access to Homicidal Means: No Identified Victim: No one History of harm to others?: No Assessment of Violence: None Noted Violent Behavior Description: None Does patient have access to weapons?: Yes (Comment) (Has a work Scientist, clinical (histocompatibility and immunogenetics)) Scientist, forensic Pending?: No Does patient have a court date: Yes Court Date: 01/21/13 (Child support modification hearing)  Psychosis Hallucinations: None noted Delusions: None noted  Mental Status Report Appear/Hygiene: Disheveled Eye Contact: Good Motor Activity: Freedom of movement;Unremarkable Speech: Logical/coherent Level of Consciousness: Alert Mood: Depressed;Anxious;Empty;Sad;Worthless, low self-esteem Affect: Anxious;Depressed;Sad Anxiety Level: Panic Attacks Panic attack frequency: 2-3 per day over last 3 days Most recent panic attack: Today Thought Processes: Coherent;Relevant Judgement: Impaired Orientation: Person;Place;Time;Situation Obsessive Compulsive Thoughts/Behaviors: Minimal  Cognitive Functioning Concentration: Decreased Memory: Recent Impaired;Remote Intact IQ: Average Insight: Good Impulse Control: Poor Appetite: Poor Weight Loss: 0   Weight Gain: 0  Sleep: Decreased Total Hours of Sleep:  (<4H/D) Vegetative Symptoms: None  ADLScreening [facility] Assessment Services) Patient's cognitive ability adequate to safely complete daily activities?: Yes Patient able to express need for assistance with ADLs?: Yes Independently performs ADLs?: Yes (appropriate for developmental age)  Abuse/Neglect [facility]) Physical Abuse: Denies Verbal Abuse: Denies Sexual Abuse: Denies  Prior Inpatient Therapy Prior Inpatient Therapy: Yes Prior Therapy Dates: 16 years ago Prior Therapy Facilty/Provider(s): [facility] Reason for Treatment: detox/rehab from ETOH  Prior Outpatient Therapy Prior Outpatient Therapy: No Prior Therapy Dates: None Prior Therapy Facilty/Provider(s): N/A Reason for Treatment: N/A  ADL Screening (condition at time of admission) Patient's cognitive ability adequate to safely complete daily activities?: Yes Patient able to express need for assistance with ADLs?: Yes Independently performs ADLs?: Yes (appropriate for developmental age) Weakness of Legs: None Weakness of Arms/Hands: None  Home Assistive Devices/Equipment Home Assistive Devices/Equipment: None    Abuse/Neglect Assessment (Assessment to be complete while patient is alone) Physical Abuse: Denies Verbal Abuse: Denies Sexual Abuse: Denies Exploitation of patient/patient's resources: Denies Self-Neglect: Denies Values / Beliefs Cultural Requests During Hospitalization: None Spiritual Requests During Hospitalization: None   Advance Directives (For Healthcare) Advance Directive: Patient does not have advance directive;Patient would not like information    Additional Information 1:1 In Past 12 Months?: No CIRT Risk: No Elopement Risk: No Does patient have medical clearance?: Yes     Disposition:  Disposition Disposition of Patient: Inpatient  treatment program;Referred to Type of inpatient treatment program: Adult Patient referred to:  [facility]  referral)  On Site Evaluation by:   Reviewed with Physician:  Dr. Mordecai Rasmussen Ray 01/08/2013 10:41 PM

## 2013-01-08 NOTE — ED Notes (Signed)
Security paged. 

## 2013-01-09 ENCOUNTER — Encounter (HOSPITAL_COMMUNITY): Payer: Self-pay | Admitting: *Deleted

## 2013-01-09 ENCOUNTER — Inpatient Hospital Stay (HOSPITAL_COMMUNITY)
Admission: AD | Admit: 2013-01-09 | Discharge: 2013-01-21 | DRG: 881 | Disposition: A | Discharge status: Home / Self Care | Payer: No Typology Code available for payment source | Source: Ambulatory Visit | Attending: Psychiatry | Admitting: Psychiatry

## 2013-01-09 ENCOUNTER — Encounter (HOSPITAL_COMMUNITY): Payer: Self-pay | Admitting: Emergency Medicine

## 2013-01-09 DIAGNOSIS — R45851 Suicidal ideations: Secondary | ICD-10-CM

## 2013-01-09 DIAGNOSIS — F3289 Other specified depressive episodes: Principal | ICD-10-CM | POA: Diagnosis present

## 2013-01-09 DIAGNOSIS — F32A Depression, unspecified: Secondary | ICD-10-CM | POA: Diagnosis present

## 2013-01-09 DIAGNOSIS — F1010 Alcohol abuse, uncomplicated: Secondary | ICD-10-CM | POA: Diagnosis present

## 2013-01-09 DIAGNOSIS — F411 Generalized anxiety disorder: Secondary | ICD-10-CM | POA: Diagnosis present

## 2013-01-09 DIAGNOSIS — F419 Anxiety disorder, unspecified: Secondary | ICD-10-CM | POA: Diagnosis present

## 2013-01-09 DIAGNOSIS — Z8673 Personal history of transient ischemic attack (TIA), and cerebral infarction without residual deficits: Secondary | ICD-10-CM

## 2013-01-09 DIAGNOSIS — F329 Major depressive disorder, single episode, unspecified: Secondary | ICD-10-CM

## 2013-01-09 DIAGNOSIS — Z79899 Other long term (current) drug therapy: Secondary | ICD-10-CM

## 2013-01-09 MED ORDER — TRAZODONE HCL 50 MG PO TABS
25.0000 mg | ORAL_TABLET | ORAL | Status: AC
  Administered 2013-01-09: 25 mg via ORAL
  Filled 2013-01-09: qty 1

## 2013-01-09 MED ORDER — ACETAMINOPHEN 325 MG PO TABS: 650.0000 mg | ORAL_TABLET | Freq: Four times a day (QID) | ORAL | Status: DC | PRN

## 2013-01-09 MED ORDER — TRAZODONE HCL 100 MG PO TABS
100.0000 mg | ORAL_TABLET | Freq: Every evening | ORAL | Status: DC | PRN
  Administered 2013-01-09 – 2013-01-13 (×10): 100 mg via ORAL
  Filled 2013-01-09 (×14): qty 1

## 2013-01-09 MED ORDER — DIPHENHYDRAMINE HCL 50 MG PO CAPS
50.0000 mg | ORAL_CAPSULE | Freq: Once | ORAL | Status: AC
  Administered 2013-01-09: 50 mg via ORAL
  Filled 2013-01-09: qty 1

## 2013-01-09 MED ORDER — MAGNESIUM HYDROXIDE 400 MG/5ML PO SUSP: 30.0000 mL | Freq: Every day | ORAL | Status: DC | PRN

## 2013-01-09 MED ORDER — TRAZODONE 25 MG HALF TABLET
25.0000 mg | ORAL_TABLET | ORAL | Status: DC
  Filled 2013-01-09 (×3): qty 1

## 2013-01-09 MED ORDER — TRAZODONE HCL 50 MG PO TABS
25.0000 mg | ORAL_TABLET | ORAL | Status: DC
  Administered 2013-01-09: 25 mg via ORAL
  Filled 2013-01-09 (×4): qty 0.5

## 2013-01-09 MED ORDER — GABAPENTIN 300 MG PO CAPS
300.0000 mg | ORAL_CAPSULE | Freq: Four times a day (QID) | ORAL | Status: DC
  Administered 2013-01-09 – 2013-01-21 (×47): 300 mg via ORAL
  Filled 2013-01-09 (×18): qty 1
  Filled 2013-01-09: qty 56
  Filled 2013-01-09 (×2): qty 1
  Filled 2013-01-09: qty 56
  Filled 2013-01-09 (×8): qty 1
  Filled 2013-01-09: qty 56
  Filled 2013-01-09 (×16): qty 1
  Filled 2013-01-09: qty 56
  Filled 2013-01-09 (×7): qty 1

## 2013-01-09 MED ORDER — TRAZODONE 25 MG HALF TABLET
25.0000 mg | ORAL_TABLET | ORAL | Status: DC
  Filled 2013-01-09 (×4): qty 1

## 2013-01-09 MED ORDER — INFLUENZA VIRUS VACC SPLIT PF IM SUSP
0.5000 mL | INTRAMUSCULAR | Status: AC
  Administered 2013-01-10: 0.5 mL via INTRAMUSCULAR

## 2013-01-09 MED ORDER — LORAZEPAM 1 MG PO TABS
1.0000 mg | ORAL_TABLET | Freq: Once | ORAL | Status: AC
  Administered 2013-01-09: 1 mg via ORAL
  Filled 2013-01-09: qty 1

## 2013-01-09 MED ORDER — ALUM & MAG HYDROXIDE-SIMETH 200-200-20 MG/5ML PO SUSP: 30.0000 mL | ORAL | Status: DC | PRN

## 2013-01-09 NOTE — Clinical Social Work Psychosocial (Signed)
[facility] Group Notes: (Clinical Social Work)   01/09/2013      Type of Therapy:  Group Therapy   Participation Level:  Did Not Attend    Ambrose Mantle, LCSW 01/09/2013, 4:37 PM

## 2013-01-09 NOTE — [facility] Counselor (Signed)
Patient has been accepted at [facility] by Dr. Hilton Cork to the services of Dr. Daleen Bo; room 505.2

## 2013-01-09 NOTE — Tx Team (Signed)
Initial Interdisciplinary Treatment Plan  PATIENT STRENGTHS: (choose at least two) Ability for insight Average or above average intelligence Capable of independent living Communication skills General fund of knowledge Motivation for treatment/growth Supportive family/friends  PATIENT STRESSORS: Financial difficulties Legal issue Medication change or noncompliance Occupational concerns Substance abuse   PROBLEM LIST: Problem List/Patient Goals Date to be addressed Date deferred Reason deferred Estimated date of resolution  Suicidal ideation 01/09/2013   D/c        Substance abuse 01/09/2013   D/c        depression 01/09/2013   D/c                           DISCHARGE CRITERIA:  Ability to meet basic life and health needs Improved stabilization in mood, thinking, and/or behavior Motivation to continue treatment in a less acute level of care Need for constant or close observation no longer present Reduction of life-threatening or endangering symptoms to within safe limits Safe-care adequate arrangements made Verbal commitment to aftercare and medication compliance  PRELIMINARY DISCHARGE PLAN: Attend aftercare/continuing care group Attend PHP/IOP Attend 12-step recovery group Outpatient therapy Participate in family therapy Placement in alternative living arrangements Return to previous work or school arrangements  PATIENT/FAMIILY INVOLVEMENT: This treatment plan has been presented to and reviewed with the patient, DHANVIN SZETO.  The patient and family have been given the opportunity to ask questions and make suggestions.  Earline Mayotte 01/09/2013, 1:49 PM

## 2013-01-09 NOTE — ED Provider Notes (Signed)
Pt accepted at [facility] by Dr. Sondra Come. Kewan Mcnease, MD 01/09/13 1125

## 2013-01-09 NOTE — Progress Notes (Signed)
Patient ID: Nathaniel Warren, male   DOB: [birth year], 47 y.o.   MRN: [id number] Patient's first admission to [facility].  SI constant, contracts for safety.  Denied HI.   Denied A/V hallucinations.   Denied pain at this time.  Does experience back pain and bilateral elbow pain.  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  On probation since April 2013 for DWI.  Had quit drinking 14 years.  On 03/16/2013 picked up bottle just one night, drank entire 5th, DWI occurred.  Feels he has lost everything, no job, home foreclosed, living in hotel with wife, both vehicles "blew up":   After foreclosure they lived with his wife's grandmother, furniture in storage and was auctioned in January.  Been together with wife 5 years, married 2 years.    Wife has his handwritten note for his POA.  Drank approximately one alcohol drink per month since DWI.  As teenager tried THC but did not like THC and has not used again.  In 2006 had right ankle surgery, pins/screws, was jumping on trampoline.  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  No seizure since 2012.  Tatoo right upper arm.   Smokes 1.5 packs cigarettes daily.  Drinks approximately 2-3 alcoholic drinks monthly presently.  Allergy aspirin.    Yesterday patient was sitting on bridge rail [place] [address].  Man stopped him from jumping off bridge.  Patient walked to hospital for help.   Stated he stopped several times because he was having panic attacks.  Locker 10, tennis shoes, belt, cigarettes, lighters, pocket knife, clothes, jackets, one patient valuable envelope that came from [facility] that was sealed and not opened at [facility].   Patient stated he has wallet and cards in envelope.  Patient witnessed envelope being put in locker 10.  Food and drink given to patient.  Patient oriented to unit and is presently resting in bed.  Patient has been cooperative and pleasant.

## 2013-01-09 NOTE — ED Provider Notes (Signed)
Medical screening examination/treatment/procedure(s) were conducted as a shared visit with non-physician practitioner(s) and myself.  I personally evaluated the patient during the encounter Pt with depression, thoughts of suicide. Labs. Pt alert, content, depressed mood. Moved to pod C for psych eval, signed out to oncoming provider to recheck and dispo appropriately.   Suzi Roots, MD 01/09/13 [phone]

## 2013-01-10 MED ORDER — SERTRALINE HCL 25 MG PO TABS
25.0000 mg | ORAL_TABLET | Freq: Every day | ORAL | Status: DC
  Administered 2013-01-10 – 2013-01-13 (×4): 25 mg via ORAL
  Filled 2013-01-10 (×5): qty 1

## 2013-01-10 MED ORDER — ADULT MULTIVITAMIN W/MINERALS CH
1.0000 | ORAL_TABLET | Freq: Every day | ORAL | Status: DC
  Administered 2013-01-10 – 2013-01-21 (×12): 1 via ORAL
  Filled 2013-01-10 (×14): qty 1

## 2013-01-10 MED ORDER — NICOTINE 21 MG/24HR TD PT24
21.0000 mg | MEDICATED_PATCH | Freq: Every day | TRANSDERMAL | Status: DC
  Administered 2013-01-10 – 2013-01-21 (×11): 21 mg via TRANSDERMAL
  Filled 2013-01-10 (×15): qty 1

## 2013-01-10 MED ORDER — IBUPROFEN 200 MG PO TABS
200.0000 mg | ORAL_TABLET | Freq: Four times a day (QID) | ORAL | Status: DC | PRN
  Administered 2013-01-10 – 2013-01-11 (×2): 200 mg via ORAL
  Filled 2013-01-10 (×2): qty 1

## 2013-01-10 MED ORDER — HYDROXYZINE HCL 25 MG PO TABS
25.0000 mg | ORAL_TABLET | Freq: Four times a day (QID) | ORAL | Status: DC | PRN
  Administered 2013-01-10 – 2013-01-11 (×3): 25 mg via ORAL

## 2013-01-10 NOTE — Progress Notes (Signed)
Nutrition Brief Note  Patient identified on the Malnutrition Screening Tool (MST) Report  Body mass index is 22.15 kg/(m^2). Patient meets criteria for normal weight based on current BMI.   Pt reports poor intake for the past 3 months with 16 pound unintended weight loss. Pt reports he was consuming only small amounts of food. Pt reports his appetite has improved since admission and is not interested in getting any nutritional supplements. Pt interested in getting multivitamin, will order. Encouraged pt to continue to eat well during admission.   Levon Hedger MS, RD, LDN [phone] Pager [phone] After Hours Pager

## 2013-01-10 NOTE — Progress Notes (Signed)
[facility] LCSW Group Therapy  01/10/2013 3:04 PM  Type of Therapy:  Group Therapy  Participation Level:  Minimal  Participation Quality:  Appropriate and Attentive  Affect:  Appropriate, Depressed and Flat  Cognitive:  Alert and Appropriate  Insight:  Developing/Improving  Engagement in Therapy:  Developing/Improving  Modes of Intervention:  Discussion, Education, Exploration, Problem-solving, Rapport Building and Support  Summary of Progress/Problems:  Patient shared the obstacle he needs to overcome is unemployment and where he will live.  He shared he could help himself overcome by continue to look for work and taking medications.  Wynn Banker 01/10/2013, 3:04 PM

## 2013-01-10 NOTE — Progress Notes (Signed)
Writer spoke with patient who had been sitting in the dayroom watching tv but no interaction with peers. Writer spoke with patient who complained of feeling like he was going to jump out of his skin. Patient reported that he has felt this way for a few days and inquired about when he would receive his next dose of trazadone. Patient was informed that it was scheduled for 2200 but patient didn't feel he could wait that long. Writer spoke with Dr. Laury Deep who was still on the unit and verbal order given to discontinue trazadone 25 mg every 4 hours while awake. Patient is to receive his trazadone 100 mg at hs with a repeat. Patient informed of medication discontinued and still requested something for his anxiety. Order was received from N. Mashburn PA for patient to receive 50 mg of benadryl once and 300 mg of neurontin 4 x daily including one dose now. Patient received medication and is hopeful this will work. Patient reports constant si and verbally contracts, support and encouragement offered, safety maintained on unit, will continue to monitor.

## 2013-01-10 NOTE — Progress Notes (Signed)
[facility] LCSW Aftercare Discharge Planning Group Note  01/10/2013 3:02 PM  Participation Quality:  Appropriate  Affect:  Appropriate, Blunted, Depressed and Flat  Cognitive:  Alert and Appropriate  Insight:  Developing/Improving  Engagement in Group:  Developing/Improving  Modes of Intervention:  Education, Exploration, Problem-solving and Support  Summary of Progress/Problems:  Patient advised of admitting to hospital due to a suicide attempt.  He continues to endorse SI but contracts for safety.  He rates depression and anxiety at ten.  Patient denies alcohol and drug use.  He advised he will need assistance with transportation and medications at discharge.  Wynn Banker 01/10/2013, 3:02 PM

## 2013-01-10 NOTE — Progress Notes (Addendum)
D:  Patient's self inventory sheet, patient has fair sleep, improving appetite, low energy level, poor attention span.  Rated depression #9, hopelessness #10.  Denied withdrawals.  Constant SI, contracts for safety.   Has experienced pain in past 24 hours.  Worst pain #7.  Has not thought about discharge.  "Can I please have something for the pain in my elbows and back?  Transportation and being able to afford medications are a problem."   A:  Medications administered per MD order.  Support and encouragement given throughout day.  Support and safety checks completed as ordered.   R:  Following treatment plan.  Denied HI.   Denied A/V hallucinations.  Patient remains safe and receptive on unit.  SI, contracts for safety. Patient declined tylenol for elbow/back pain.  Stated he wants ibuprofen or another medication.  Patient has been seen moving arms/hands eating, motioning, and walking in unit.  No signs or pain/discomfort noted on patient's face or body movements.

## 2013-01-10 NOTE — Progress Notes (Signed)
Adult Psychoeducational Group Note  Date:  01/10/2013 Time:  12:33 PM  Group Topic/Focus:  Wellness Toolbox:   The focus of this group is to discuss various aspects of wellness, balancing those aspects and exploring ways to increase the ability to experience wellness.  Patients will create a wellness toolbox for use upon discharge.  Participation Level:  Minimal  Participation Quality:  Attentive  Affect:  Appropriate  Cognitive:  Oriented  Insight: Good  Engagement in Group:  Engaged  Modes of Intervention:  Discussion, Education and Support  Additional Comments:  Pt stated he is finding coping skills for SI thoughts.   Nathaniel Warren T 01/10/2013, 12:33 PM

## 2013-01-10 NOTE — Tx Team (Signed)
Interdisciplinary Treatment Plan Update (Adult)  Date:  01/10/2013  Time Reviewed:  10:01 AM   Progress in Treatment: Attending groups:   Yes   Participating in groups:  Yes Taking medication as prescribed:  Yes Tolerating medication:  Yes Family/Significant othe contact made: Contact to be made with family Patient understands diagnosis:  Yes Discussing patient identified problems/goals with staff: Yes Medical problems stabilized or resolved: Yes Denies suicidal/homicidal ideation: No, but contracts for safety Issues/concerns per patient self-inventory:  Other:   New problem(s) identified:  Reason for Continuation of Hospitalization: Anxiety Depression Medication stabilization Suicidal ideation  Interventions implemented related to continuation of hospitalization:  Medication Management; safety checks q 15 mins  Additional comments:  Estimated length of stay:  2-3 days  Discharge Plan:  Home with outpatient follow up  New goal(s):  Review of initial/current patient goals per problem list:    1.  Goal(s): Eliminate SI/other thoughts of self harm (Patient will no longer endorse SI/HI or thoughts of self harm)   Met:  No  Target date: d/c  As evidenced by: Patient continues to endorse SI    2.  Goal (s):Reduce depression/anxiety (Paitent will rate symptoms at four or below)  Met: No  Target date: d/c  As evidenced by: Patient rates symptoms at nine and ten today    3.  Goal(s):.stabilize on meds (Patient will report being stabilized on medications)   Met:  No  Target date: d/c  As evidenced by: Patient continues to endorse symptoms    4.  Goal(s): Refer for outpatient follow up (Follow up appointment will be scheduled)   Met:  No  Target date: d/c  As evidenced by: Follow up appointment scheduled    Attendees: Patient:   01/10/2013 10:01 AM  Physican:  Patrick North, MD 01/10/2013 10:01 AM  Nursing:  Neill Loft, RN 01/10/2013 10:01 AM    Nursing:    Quintella Reichert,  RN 01/10/2013 10:01 AM   Clinical Social Worker:  Juline Patch, LCSW 01/10/2013 10:01 AM   Other: Tera Helper, PHM-NP 01/10/2013 10:01 AM   Other:   01/10/2013 10:01 AM Other:        01/10/2013 10:01 AM
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
## 2013-01-10 NOTE — Progress Notes (Addendum)
Patient ID: Nathaniel Warren, male   DOB: 07/22/1966, 47 y.o.   MRN: [id number] D: Pt. Reports pt. Visible on the unit, playing cards, interaction. Pt. Reports depression at "9" of 10.  Pt. Reports had a panic attack 5 days ago, "it scared me so bad I didn't know what to do, I thought I was having a heart attack"  Pt. Reports hx of seizures and stroke.  Pt. Is says he was sober for 14 years and relapsed in April and got a DUI and went to jail for 69 days. Pt. Reports mom had mental issues and substance abuse issues. Pt. Says he's here hoping to get anxiety level down, and get panic disorder fixed. Pt. Also reports two SA attempts but none received attention, said someone told him is he drank a 1/5 of liquor in 10 minutes it would kill him but it didn't, also reports sat on bridge near [facility]. And [address] and was going to jump but someone past by and stopped him.  A: Writer introduced self to client provided therapeutic listening while encouraging client to express his feelings.  Staff will monitor q57min for safety. Writer encouraged group. R. Pt. Attended group. Pt. Is safe on the unit.

## 2013-01-11 DIAGNOSIS — F341 Dysthymic disorder: Secondary | ICD-10-CM

## 2013-01-11 DIAGNOSIS — F419 Anxiety disorder, unspecified: Secondary | ICD-10-CM | POA: Diagnosis present

## 2013-01-11 DIAGNOSIS — F329 Major depressive disorder, single episode, unspecified: Secondary | ICD-10-CM | POA: Diagnosis present

## 2013-01-11 DIAGNOSIS — F32A Depression, unspecified: Secondary | ICD-10-CM | POA: Diagnosis present

## 2013-01-11 MED ORDER — PANTOPRAZOLE SODIUM 40 MG PO TBEC
40.0000 mg | DELAYED_RELEASE_TABLET | Freq: Two times a day (BID) | ORAL | Status: DC
  Administered 2013-01-11 – 2013-01-21 (×20): 40 mg via ORAL
  Filled 2013-01-11 (×25): qty 1

## 2013-01-11 MED ORDER — BUSPIRONE HCL 10 MG PO TABS
10.0000 mg | ORAL_TABLET | Freq: Three times a day (TID) | ORAL | Status: DC
  Administered 2013-01-11 – 2013-01-21 (×30): 10 mg via ORAL
  Filled 2013-01-11 (×10): qty 1
  Filled 2013-01-11: qty 42
  Filled 2013-01-11 (×18): qty 1
  Filled 2013-01-11: qty 42
  Filled 2013-01-11 (×4): qty 1
  Filled 2013-01-11: qty 42
  Filled 2013-01-11 (×2): qty 1

## 2013-01-11 MED ORDER — IBUPROFEN 800 MG PO TABS
800.0000 mg | ORAL_TABLET | Freq: Four times a day (QID) | ORAL | Status: DC | PRN
  Administered 2013-01-11 – 2013-01-21 (×22): 800 mg via ORAL
  Filled 2013-01-11 (×22): qty 1

## 2013-01-11 NOTE — Progress Notes (Signed)
Grief and Loss Group  Group discussed significant losses in life and reactions to losses.  Group also explored emotions, coping skills for loss, and limits of control.  Pt was present and attentive in group.  Pt was nonverbally responsive to group members.  Whitney P Akers Counseling Intern [employer]

## 2013-01-11 NOTE — Progress Notes (Signed)
[facility] MD Progress Note  01/11/2013 1:26 PM Nathaniel Warren  MRN:  [id number] Subjective:  8/10 depression, 9/10 anxiety, complains of bilateral elbow pain for the past 3 months--uses ibuprofen at home, sleep was "good, woke up twice but went back to sleep", appetite "better", see plan below for adjustments to his plan Diagnosis:   Axis I: Anxiety Disorder NOS and Depressive Disorder NOS Axis II: Deferred Axis III:  Past Medical History  Diagnosis Date  . Stroke    Axis IV: economic problems, housing problems, occupational problems, other psychosocial or environmental problems, problems related to social environment and problems with primary support group Axis V: 41-50 serious symptoms  ADL's:  Intact  Sleep: Good  Appetite:  Fair  Suicidal Ideation:  Denies Homicidal Ideation:  Denies  Psychiatric Specialty Exam: Review of Systems  Constitutional: Negative.   HENT: Negative.   Eyes: Negative.   Respiratory: Negative.   Cardiovascular: Negative.   Gastrointestinal: Negative.   Genitourinary: Negative.   Musculoskeletal: Negative.   Skin: Negative.   Neurological: Negative.   Endo/Heme/Allergies: Negative.   Psychiatric/Behavioral: Positive for depression. The patient is nervous/anxious.     Blood pressure 104/74, pulse 83, temperature 98.1 F (36.7 C), temperature source Oral, resp. rate 20, height 5\' 9"  (1.753 m), weight 68.04 kg (150 lb).Body mass index is 22.15 kg/(m^2).  General Appearance: Casual  Eye Contact::  Fair  Speech:  Normal Rate  Volume:  Normal  Mood:  Anxious and Depressed  Affect:  Congruent  Thought Process:  Coherent  Orientation:  Full (Time, Place, and Person)  Thought Content:  WDL  Suicidal Thoughts:  No  Homicidal Thoughts:  No  Memory:  Immediate;   Fair Recent;   Fair Remote;   Fair  Judgement:  Fair  Insight:  Fair  Psychomotor Activity:  Decreased  Concentration:  Fair  Recall:  Fair  Akathisia:  No  Handed:  Right  AIMS (if  indicated):     Assets:  Resilience Social Support  Sleep:  Number of Hours: 6    Current Medications: Current Facility-Administered Medications  Medication Dose Route Frequency Provider Last Rate Last Dose  . alum & mag hydroxide-simeth (MAALOX/MYLANTA) 200-200-20 MG/5ML suspension 30 mL  30 mL Oral Q4H PRN Larena Sox, MD      . busPIRone (BUSPAR) tablet 10 mg  10 mg Oral TID Nanine Means, NP   10 mg at 01/11/13 1205  . gabapentin (NEURONTIN) capsule 300 mg  300 mg Oral QID Verne Spurr, PA-C   300 mg at 01/11/13 1204  . ibuprofen (ADVIL,MOTRIN) tablet 800 mg  800 mg Oral Q6H PRN Nanine Means, NP   800 mg at 01/11/13 1209  . magnesium hydroxide (MILK OF MAGNESIA) suspension 30 mL  30 mL Oral Daily PRN Larena Sox, MD      . multivitamin with minerals tablet 1 tablet  1 tablet Oral Daily Lavena Bullion, RD   1 tablet at 01/11/13 0807  . nicotine (NICODERM CQ - dosed in mg/24 hours) patch 21 mg  21 mg Transdermal Daily Himabindu Ravi, MD   21 mg at 01/11/13 0653  . pantoprazole (PROTONIX) EC tablet 40 mg  40 mg Oral BID AC Nanine Means, NP      . sertraline (ZOLOFT) tablet 25 mg  25 mg Oral Daily Himabindu Ravi, MD   25 mg at 01/11/13 0807  . traZODone (DESYREL) tablet 100 mg  100 mg Oral QHS,MR X 1 Fredrik Cove, PA-C  100 mg at 01/10/13 2246    Lab Results: No results found for this or any previous visit (from the past 48 hour(s)).  Physical Findings: AIMS: Facial and Oral Movements Muscles of Facial Expression: None, normal Lips and Perioral Area: None, normal Jaw: None, normal Tongue: None, normal,Extremity Movements Upper (arms, wrists, hands, fingers): None, normal Lower (legs, knees, ankles, toes): None, normal, Trunk Movements Neck, shoulders, hips: None, normal, Overall Severity Severity of abnormal movements (highest score from questions above): None, normal Incapacitation due to abnormal movements: None, normal Patient's awareness of abnormal movements  (rate only patient's report): No Awareness, Dental Status Current problems with teeth and/or dentures?: Yes Does patient usually wear dentures?: Yes  CIWA:  CIWA-Ar Total: 1  COWS:  COWS Total Score: 1   Treatment Plan Summary: Daily contact with patient to assess and evaluate symptoms and progress in treatment Medication management  Plan:  Review of chart, vital signs, medications, and notes. 1-Individual and group therapy 2-Medication management for depression and anxiety:  Medications reviewed with the patient and vistaril changed to buspar TID due to little effect from the vistaril, ibuprofen increased to help his elbow pain 3-Coping skills for depression, anxiety, and pain 4-Continue crisis stabilization and management 5-Address health issues--monitoring bilateral elbow pain, stable 6-Treatment plan in progress to prevent relapse of depression and anxiety  Medical Decision Making Problem Points:  Established problem, stable/improving (1) and Review of psycho-social stressors (1) Data Points:  Review of medication regiment & side effects (2)  I certify that inpatient services furnished can reasonably be expected to improve the patient's condition.   Nanine Means, PMH-NP 01/11/2013, 1:26 PM

## 2013-01-11 NOTE — Progress Notes (Signed)
[facility] LCSW Aftercare Discharge Planning Group Note  01/11/2013 11:33 AM  Participation Quality:  Appropriate  Affect:  Appropriate, Blunted, Depressed and Flat  Cognitive:  Alert and Appropriate  Insight:  Developing/Improving  Engagement in Group:  Developing/Improving  Modes of Intervention:  Education, Exploration, Problem-solving and Support  Summary of Progress/Problems:  Patient  continues to endorse SI but contracts for safety.  He rates depression and anxiety at eight today.  Daily workbook provided.  Wynn Banker 01/11/2013, 11:33 AM

## 2013-01-11 NOTE — Progress Notes (Signed)
[facility] LCSW Group Therapy  01/11/2013 3:37 PM  Type of Therapy:  Group Therapy  Participation Level:  Active  Participation Quality:  Appropriate and Attentive  Affect:  Appropriate  Cognitive:  Alert and Appropriate  Insight:  Engaged  Engagement in Therapy:  Engaged  Modes of Intervention:  Discussion, Problem-solving, Rapport Building and Support  Summary of Progress/Problems:  Patient shared he has no concerns with his diagnosis.  He shared he does not believe people will see a diagnosis but will see him.  He advised he is in the process of making a plan of how to move forward with his life.  Wynn Banker 01/11/2013, 3:37 PM

## 2013-01-11 NOTE — Progress Notes (Signed)
Adult Psychoeducational Group Note  Date:  01/11/2013 Time:  6:23 PM  Group Topic/Focus:  Recovery Goals:   The focus of this group is to identify appropriate goals for recovery and establish a plan to achieve them.  Participation Level:  Minimal  Participation Quality:  Appropriate and Attentive  Affect:  Appropriate  Cognitive:  Appropriate  Insight: Limited  Engagement in Group:  Engaged and Limited  Modes of Intervention:  Discussion, Education, Dentist and Support  Additional Comments:  Nathaniel Warren attended group, and participated.  Patient discussed in group the definition of recovery. He completed workbook on writing two changes that were needed to be made in her life and how she could change them to stay on path for recovery.    [name] 01/11/2013, 6:23 PM

## 2013-01-11 NOTE — Progress Notes (Signed)
D:  Patient's self inventory sheet, patient has fair sleep, improving appetite, low energy level, improving attention span.  Rated depression and hopelessness #9.  Denied withdrawals.  SI off/on, contracts for safety.   Has experienced pain in past 24 hours.  Low pain goal, worst pain #6.  After discharge, plans to find a job and place to live.   Wants his advil increased.   Denied HI.  Denied A/V hallucinations.  Unsure of discharge plans.   No money to buy medications after discharge.  "Trying to find a way to pay for medications."   He would like to stay on his medications. A:   Medications administered per MD order.  Support and encouragement given throughout day.  Support and safety checks completed as ordered.  R:  Following treatment plan.  Denied HI.  Denied A/V hallucinations.  SI, contracts for safety.  Patient remains safe and receptive on unit.  Will continue to monitor.

## 2013-01-12 MED ORDER — HYDROXYZINE HCL 10 MG PO TABS
10.0000 mg | ORAL_TABLET | Freq: Four times a day (QID) | ORAL | Status: DC | PRN
  Administered 2013-01-12 – 2013-01-14 (×6): 10 mg via ORAL
  Filled 2013-01-12 (×5): qty 1

## 2013-01-12 NOTE — Progress Notes (Signed)
Adult Psychoeducational Group Note  Date:  01/12/2013 Time:  3:28 PM  Group Topic/Focus:  Personal Choices and Values:   The focus of this group is to help patients assess and explore the importance of values in their lives, how their values affect their decisions, how they express their values and what opposes their expression.  Participation Level:  Active  Participation Quality:  Appropriate, Attentive and Sharing  Affect:  Appropriate  Cognitive:  Appropriate  Insight: Appropriate  Engagement in Group:  Engaged  Modes of Intervention:  Discussion, Education, Problem-solving and Support  Additional Comments:  Nathaniel Warren attended and participated in group. Patient did stated what were some of his negative and positive choices he has made throughout his life span. Patient also completed his worksheets in his workbook on identifying values and choosing a value oriented life.    [name] 01/12/2013, 3:28 PM

## 2013-01-12 NOTE — Progress Notes (Signed)
D-Patient is visible on the unit today. Appears to be very anxious. Rates his depression/level of hopelessness at a 9. Endorses passive SI but is able to contract. He reports low energy level, fair sleep but improving ability to pay attention.  A-Patient complains of severe anxiety and spoke with the MD about his symptoms. The patient has a new order for vistaril 10 mg QID prn. He received his first dose at 15:30. Patient reports stressors of finding a place to stay and fears of following through on suicidal thoughts. He wrote on his self inventory sheet today regarding changes to make after d/c "Try to find a place to live. Hope I don't kill myself."  R-Patient observed interacting with peers on the unit. He is hopeful that the vistaril will help him and reports that is has in the past.

## 2013-01-12 NOTE — Progress Notes (Signed)
Reviewed

## 2013-01-12 NOTE — Progress Notes (Signed)
[facility] Group Notes:  (Nursing/MHT/Case Management/Adjunct)  Type of Therapy:  Psychoeducational Skills  Participation Level:  Active  Participation Quality:  Appropriate, Attentive, Intrusive, Inattentive, Redirectable and Sharing  Affect:  Excited  Cognitive:  Alert, Appropriate and Oriented  Insight:  Improving  Engagement in Group:  Distracting and Engaged  Modes of Intervention:  Activity, Clarification, Discussion, Education, Limit-setting, Problem-solving, Rapport Building, Socialization and Support  Summary of Progress/Problems: Nathaniel Warren attended psychoeducational group on labels. 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 Nathaniel Warren was engaged in side talk but was easily redirectable and became engaged while group discussed what labels are, how we use them, how they affect the way we think about and perceive the world, and listed positive and negative labels they have used or been called. Nathaniel Warren stated a positive label he defines himself by is caring and his own name. Nathaniel Warren was given homework assignment to list 10 words he has been labeled and to find the reality of the situation/label.    Wandra Scot 01/12/2013 12:52 PM

## 2013-01-12 NOTE — Progress Notes (Signed)
[facility] MD Progress Note  01/12/2013 2:27 PM Nathaniel Warren  MRN:  [id number] Subjective:  Patient reporting anxiety, depressed mood and suicidal thoughts. States when zoloft was increased to 50mg  in the past it was not helpful. Reports the buspar is not helpful. Diagnosis:   Axis I: Anxiety Disorder NOS and Depressive Disorder NOS Axis II: Deferred Axis III:  Past Medical History  Diagnosis Date  . Stroke    Axis IV: housing problems, occupational problems and other psychosocial or environmental problems Axis V: 41-50 serious symptoms  ADL's:  Intact  Sleep: Fair  Appetite:  Fair    Psychiatric Specialty Exam: Review of Systems  Constitutional: Negative.   HENT: Negative.   Eyes: Negative.   Respiratory: Negative.   Cardiovascular: Negative.   Gastrointestinal: Negative.   Genitourinary: Negative.   Musculoskeletal: Positive for myalgias.  Skin: Negative.   Neurological: Negative.   Endo/Heme/Allergies: Negative.   Psychiatric/Behavioral: Positive for depression and suicidal ideas. The patient is nervous/anxious.     Blood pressure 117/77, pulse 85, temperature 97.9 F (36.6 C), temperature source Oral, resp. rate 16, height 5\' 9"  (1.753 m), weight 68.04 kg (150 lb).Body mass index is 22.15 kg/(m^2).  General Appearance: Casual  Eye Contact::  Fair  Speech:  tremulous  Volume:  Normal  Mood:  Anxious and Depressed  Affect:  Non-Congruent and Constricted  Thought Process:  Coherent  Orientation:  Full (Time, Place, and Person)  Thought Content:  WDL  Suicidal Thoughts:  Yes.  without intent/plan  Homicidal Thoughts:  No  Memory:  Immediate;   Fair Recent;   Fair Remote;   Fair  Judgement:  Fair  Insight:  Fair  Psychomotor Activity:  Normal  Concentration:  Fair  Recall:  Fair  Akathisia:  No  Handed:  Right  AIMS (if indicated):     Assets:  Communication Skills  Sleep:  Number of Hours: 5.5    Current Medications: Current Facility-Administered  Medications  Medication Dose Route Frequency Provider Last Rate Last Dose  . alum & mag hydroxide-simeth (MAALOX/MYLANTA) 200-200-20 MG/5ML suspension 30 mL  30 mL Oral Q4H PRN Larena Sox, MD      . busPIRone (BUSPAR) tablet 10 mg  10 mg Oral TID Nanine Means, NP   10 mg at 01/12/13 1210  . gabapentin (NEURONTIN) capsule 300 mg  300 mg Oral QID Verne Spurr, PA-C   300 mg at 01/12/13 1210  . ibuprofen (ADVIL,MOTRIN) tablet 800 mg  800 mg Oral Q6H PRN Nanine Means, NP   800 mg at 01/12/13 0811  . magnesium hydroxide (MILK OF MAGNESIA) suspension 30 mL  30 mL Oral Daily PRN Larena Sox, MD      . multivitamin with minerals tablet 1 tablet  1 tablet Oral Daily Lavena Bullion, RD   1 tablet at 01/12/13 [phone]  . nicotine (NICODERM CQ - dosed in mg/24 hours) patch 21 mg  21 mg Transdermal Daily Kamalei Roeder, MD   21 mg at 01/12/13 0656  . pantoprazole (PROTONIX) EC tablet 40 mg  40 mg Oral BID AC Nanine Means, NP   40 mg at 01/12/13 0655  . sertraline (ZOLOFT) tablet 25 mg  25 mg Oral Daily Tesslyn Baumert, MD   25 mg at 01/12/13 0811  . traZODone (DESYREL) tablet 100 mg  100 mg Oral QHS,MR X 1 Fredrik Cove, PA-C   100 mg at 01/11/13 2241    Lab Results: No results found for this or any previous visit (  from the past 48 hour(s)).  Physical Findings: AIMS: Facial and Oral Movements Muscles of Facial Expression: None, normal Lips and Perioral Area: None, normal Jaw: None, normal Tongue: None, normal,Extremity Movements Upper (arms, wrists, hands, fingers): None, normal Lower (legs, knees, ankles, toes): None, normal, Trunk Movements Neck, shoulders, hips: None, normal, Overall Severity Severity of abnormal movements (highest score from questions above): None, normal Incapacitation due to abnormal movements: None, normal Patient's awareness of abnormal movements (rate only patient's report): No Awareness, Dental Status Current problems with teeth and/or dentures?: No Does patient  usually wear dentures?: No  CIWA:  CIWA-Ar Total: 1  COWS:  COWS Total Score: 1   Treatment Plan Summary: Daily contact with patient to assess and evaluate symptoms and progress in treatment Medication management  Plan: Continue current plan of care. Continue to monitor. Medical Decision Making Problem Points:  Established problem, stable/improving (1), Review of last therapy session (1) and Review of psycho-social stressors (1) Data Points:  Review of medication regiment & side effects (2)  I certify that inpatient services furnished can reasonably be expected to improve the patient's condition.   [name] 01/12/2013, 2:27 PM

## 2013-01-12 NOTE — Progress Notes (Signed)
  Writer spoke with patient and he reports doing and feeling a little better. Patient was observed sitting in the dayroom playing cards with peers, interacting appropriately. Patient reports passive si and he verbally contracts for safety. Patient denies hi/a/v hallucinations. Support and encouragement offered, safety maintained on unit, will continue to monitor. Patient reports that his goal is to find placement, reports that he spoke with Cathleen Fears at [facility] and she is helping with placement once discharge.

## 2013-01-12 NOTE — Progress Notes (Signed)
[facility] LCSW Aftercare Discharge Planning Group Note  01/12/2013 10:02 AM  Participation Quality:  Appropriate  Affect:  Appropriate, Blunted, Depressed and Flat  Cognitive:  Alert and Appropriate  Insight:  Developing/Improving  Engagement in Group:  Developing/Improving  Modes of Intervention:  Education, Exploration, Problem-solving and Support  Summary of Progress/Problems:  Patient endorses off/on SI. He rates depression at seven and anxiety at eight today.  Patient shared he met with the congregational RN to work on discharge plans.  Daily workbook provided.  Nathaniel Warren 01/12/2013, 10:02 AM

## 2013-01-13 MED ORDER — SERTRALINE HCL 50 MG PO TABS
50.0000 mg | ORAL_TABLET | Freq: Every day | ORAL | Status: DC
  Administered 2013-01-14 – 2013-01-18 (×5): 50 mg via ORAL
  Filled 2013-01-13 (×6): qty 1

## 2013-01-13 NOTE — Progress Notes (Signed)
Adult Psychoeducational Group Note  Date:  01/13/2013 Time:  6:30 PM  Group Topic/Focus:  Rediscovering Joy:   The focus of this group is to explore various ways to relieve stress in a positive manner.  Participation Level:  Active  Participation Quality:  Appropriate, Attentive and Sharing  Affect:  Appropriate  Cognitive:  Appropriate  Insight: Appropriate  Engagement in Group:  Engaged  Modes of Intervention:  Activity, Discussion and Socialization  Additional Comments:  Nathaniel Warren attended group and participated. Group discussion consisted of talking about how to rediscover joy. Patient stated ways that laughter and humor affect the human mind and body. Patient then expressed ways to have humor and laughter apart of their lives. Activity was apart of group, patient explained one thing enjoyable related to the five senses and how to incorporate into daily coping skills.   Nathaniel Warren 01/13/2013, 6:30 PM

## 2013-01-13 NOTE — Progress Notes (Signed)
Reviewed

## 2013-01-13 NOTE — Progress Notes (Signed)
D:  Patient up and active in the milieu today.  Has been attending and participating in groups.  He rated depression and hopelessness at a 9 and anxiety at 6.  Admits to having fleeting suicidal thoughts, but agrees to seek out staff if feeling unsafe.  Reports that sleep is still poor, but appetite is improved.   A:  Medications given as ordered.  Received PRN dose of hydroxyzine this morning which he states was effective.   R:  Pleasant and cooperative.  Interacting well with staff and peers.  Remains anxious despite medications.  Remains safe on the unit at this time.

## 2013-01-13 NOTE — Progress Notes (Signed)
Patient ID: Nathaniel Warren, male   DOB: Nov 24, 1966, 47 y.o.   MRN: [id number] D: pt. Reports had problems sleeping last night but he spoke to the doctor about changing meds. Pt. Reports anxiety and depression at "8" of 10.  Pt. Noted interacting on the unit. A: Writer encouraged karaoke and encouraged client to verbalize feelings. Staff will monitor q28min for safety.R: Pt. Is safe on the unit. Pt. Attended karaoke.

## 2013-01-13 NOTE — Progress Notes (Signed)
[facility] MD Progress Note  01/13/2013 10:28 AM Nathaniel Warren  MRN:  [id number] Subjective:  8/10 depression, 9/10 anxiety, stated he did not sleep well--"up and down most of the night", appetite improving, elbow pain under control, Remeron will be substituted for his trazodone to improve his sleep Diagnosis:   Axis I: Anxiety Disorder NOS and Depressive Disorder NOS Axis II: Deferred Axis III:  Past Medical History  Diagnosis Date  . Stroke    Axis IV: economic problems, occupational problems, other psychosocial or environmental problems, problems related to social environment and problems with primary support group Axis V: 41-50 serious symptoms  ADL's:  Intact  Sleep: Poor  Appetite:  Fair  Suicidal Ideation:  Plan:  None Intent:  None Means:  None Homicidal Ideation:  None  Psychiatric Specialty Exam: Review of Systems  Constitutional: Negative.   HENT: Negative.   Eyes: Negative.   Respiratory: Negative.   Cardiovascular: Negative.   Gastrointestinal: Negative.   Genitourinary: Negative.   Musculoskeletal: Negative.   Skin: Negative.   Neurological: Negative.   Endo/Heme/Allergies: Negative.   Psychiatric/Behavioral: Positive for depression and suicidal ideas. The patient is nervous/anxious.     Blood pressure 124/69, pulse 90, temperature 97.9 F (36.6 C), temperature source Oral, resp. rate 18, height 5\' 9"  (1.753 m), weight 68.04 kg (150 lb).Body mass index is 22.15 kg/(m^2).  General Appearance: Casual  Eye Contact::  Fair  Speech:  Normal Rate  Volume:  Normal  Mood:  Anxious and Depressed  Affect:  Congruent  Thought Process:  Coherent  Orientation:  Full (Time, Place, and Person)  Thought Content:  WDL  Suicidal Thoughts:  Yes, no intent  Homicidal Thoughts:  No  Memory:  Immediate;   Fair Recent;   Fair Remote;   Fair  Judgement:  Fair  Insight:  Fair  Psychomotor Activity:  Decreased  Concentration:  Fair  Recall:  Fair  Akathisia:  No   Handed:  Right  AIMS (if indicated):     Assets:  Communication Skills Desire for Improvement Resilience Social Support  Sleep:  Number of Hours: 6.25    Current Medications: Current Facility-Administered Medications  Medication Dose Route Frequency Provider Last Rate Last Dose  . alum & mag hydroxide-simeth (MAALOX/MYLANTA) 200-200-20 MG/5ML suspension 30 mL  30 mL Oral Q4H PRN Larena Sox, MD      . busPIRone (BUSPAR) tablet 10 mg  10 mg Oral TID Nanine Means, NP   10 mg at 01/13/13 0741  . gabapentin (NEURONTIN) capsule 300 mg  300 mg Oral QID Verne Spurr, PA-C   300 mg at 01/13/13 0741  . hydrOXYzine (ATARAX/VISTARIL) tablet 10 mg  10 mg Oral QID PRN Himabindu Ravi, MD   10 mg at 01/12/13 2016  . ibuprofen (ADVIL,MOTRIN) tablet 800 mg  800 mg Oral Q6H PRN Nanine Means, NP   800 mg at 01/13/13 4098  . magnesium hydroxide (MILK OF MAGNESIA) suspension 30 mL  30 mL Oral Daily PRN Larena Sox, MD      . multivitamin with minerals tablet 1 tablet  1 tablet Oral Daily Lavena Bullion, RD   1 tablet at 01/13/13 0741  . nicotine (NICODERM CQ - dosed in mg/24 hours) patch 21 mg  21 mg Transdermal Daily Himabindu Ravi, MD   21 mg at 01/13/13 1191  . pantoprazole (PROTONIX) EC tablet 40 mg  40 mg Oral BID AC Nanine Means, NP   40 mg at 01/13/13 4782  . sertraline (ZOLOFT) tablet  25 mg  25 mg Oral Daily Himabindu Ravi, MD   25 mg at 01/13/13 0742  . traZODone (DESYREL) tablet 100 mg  100 mg Oral QHS,MR X 1 Fredrik Cove, PA-C   100 mg at 01/12/13 2248    Lab Results: No results found for this or any previous visit (from the past 48 hour(s)).  Physical Findings: AIMS: Facial and Oral Movements Muscles of Facial Expression: None, normal Lips and Perioral Area: None, normal Jaw: None, normal Tongue: None, normal,Extremity Movements Upper (arms, wrists, hands, fingers): None, normal Lower (legs, knees, ankles, toes): None, normal, Trunk Movements Neck, shoulders, hips: None,  normal, Overall Severity Severity of abnormal movements (highest score from questions above): None, normal Incapacitation due to abnormal movements: None, normal Patient's awareness of abnormal movements (rate only patient's report): No Awareness, Dental Status Current problems with teeth and/or dentures?: No Does patient usually wear dentures?: No  CIWA:  CIWA-Ar Total: 1  COWS:  COWS Total Score: 1   Treatment Plan Summary: Daily contact with patient to assess and evaluate symptoms and progress in treatment Medication management  Plan:  Review of chart, vital signs, medications, and notes. 1-Individual and group therapy 2-Medication management for depression and anxiety:  Medications reviewed with the patient and his trazodone replaced with Remeron 3-Coping skills for depression, anxiety, and pain 4-Continue crisis stabilization and management 5-Address health issues--monitoring pain, stable/controlled 6-Treatment plan in progress to prevent relapse of depression and anxiety  Medical Decision Making Problem Points:  Established problem, worsening (2) and Review of psycho-social stressors (1) Data Points:  Review of new medications or change in dosage (2)  I certify that inpatient services furnished can reasonably be expected to improve the patient's condition.   Nanine Means, PMH-NP 01/13/2013, 10:28 AM

## 2013-01-13 NOTE — Progress Notes (Signed)
[facility] LCSW Aftercare Discharge Planning Group Note  01/13/2013 4:39 PM  Participation Quality:  Appropriate and Attentive  Affect:  Appropriate  Cognitive:  Alert and Appropriate  Insight:  Engaged  Engagement in Group:  Engaged  Modes of Intervention:  Education, Exploration, Problem-solving and Support  Summary of Progress/Problems:  Patient continues to endorse SI but able to contract for safety.  He rated depression at eight and anxiety at nine.  Patient stated he had a rough night and was having racing thoughts.  Wynn Banker 01/13/2013, 4:39 PM

## 2013-01-13 NOTE — Progress Notes (Signed)
[facility] LCSW Group Therapy  [facility]  01/13/2013 4:42 PM  Type of Therapy:  Group Therapy  Participation Level:  Active  Participation Quality:  Attentive  Affect:  Appropriate  Cognitive:  Alert and Appropriate  Insight:  Engaged  Engagement in Therapy:  Engaged  Modes of Intervention:  Education, Exploration, Problem-solving and Support  Summary of Progress/Problems:  Patient listened attentively to speaker from [employer].  He shared is in interested in following up with their programming at discharge.  Patient stated this is his first experience with depression and he does not know what to expect but would benefit from additional support.   Nathaniel Warren 01/13/2013, 4:42 PM

## 2013-01-13 NOTE — Progress Notes (Signed)
Patient ID: Nathaniel Warren, male   DOB: [birth year], 47 y.o.   MRN: [id number] D: Pt. In dayroom playing card. Pt. Reports "think they fixed my medicine" noted groups been helpful "It's taught me a couple of things even at [age] that I didn't know". A: Writer encouraged group and positive affirmations. Staff will monitor q50min for safety. R: Pt. Is safe on the unit.  Pt. Attended group.

## 2013-01-14 MED ORDER — MIRTAZAPINE 15 MG PO TABS
7.5000 mg | ORAL_TABLET | Freq: Every day | ORAL | Status: DC
  Administered 2013-01-14 – 2013-01-15 (×2): 7.5 mg via ORAL
  Filled 2013-01-14 (×4): qty 0.5

## 2013-01-14 MED ORDER — HYDROXYZINE HCL 25 MG PO TABS
25.0000 mg | ORAL_TABLET | Freq: Four times a day (QID) | ORAL | Status: DC | PRN
  Administered 2013-01-14 – 2013-01-21 (×22): 25 mg via ORAL
  Filled 2013-01-14: qty 30

## 2013-01-14 MED ORDER — ALPRAZOLAM 1 MG PO TABS: 1.0000 mg | ORAL_TABLET | ORAL | Status: AC

## 2013-01-14 MED ORDER — ALPRAZOLAM 0.5 MG PO TABS
ORAL_TABLET | ORAL | Status: AC
  Administered 2013-01-14: 1 mg
  Filled 2013-01-14: qty 2

## 2013-01-14 NOTE — Progress Notes (Signed)
D) Pt sitting in his room crying and shaking all over. Pt states "I just found out that my wife is cheating on me". Talked with his daughter who told him the information.  A) Informed Dr. Daleen Bo who gave an order for 1 mg of Xanax to help calm Pt. R) Pt was able to calm after the medication took effect. Pt able to verbalize his hurt.
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
## 2013-01-14 NOTE — Tx Team (Signed)
Interdisciplinary Treatment Plan Update (Adult)  Date:  01/14/2013  Time Reviewed:  1:04 PM   Progress in Treatment: Attending groups:   Yes   Participating in groups:  Yes Taking medication as prescribed:  Yes Tolerating medication:  Yes Family/Significant othe contact made: Contact to be made with family Patient understands diagnosis:  Yes  As evidenced by asking for help with mood stabilization Discussing patient identified problems/goals with staff: Yes  See below Medical problems stabilized or resolved: Yes Denies suicidal/homicidal ideation: Yes Issues/concerns per patient self-inventory: Rates depression and anxiety at an 8 Other:   New problem(s) identified:  Reason for Continuation of Hospitalization: Anxiety Depression Medication stabilization Suicidal ideation  Interventions implemented related to continuation of hospitalization: Zoloft/Remeron/Neurontin/Buspar/Vistaril trial  safety checks q 15 mins  Encourage group attendance and participation  Additional comments: Nathaniel Warren states he just found out his wife cheated on him as told by daughter.  As a result he is complaining of increased anxiety and depression  Estimated length of stay:  2-3 days  Discharge Plan:  Home with outpatient follow up  New goal(s):  Review of initial/current patient goals per problem list:    1.  Goal(s): Eliminate SI/other thoughts of self harm (Patient will no longer endorse SI/HI or thoughts of self harm)   Met:  Yes  Target date: 2/7  As evidenced by: Self report    2.  Goal (s):Reduce depression/anxietywith the introduction of medication/therapeutic milieu  Met: No  Target date: 2/12  As evidenced by: Patient rates symptoms at 8    3.  Goal(s):.stabilize on meds (Patient will report being stabilized on medications)   Met:  No  Target date: 2/12  As evidenced by: Patient continues to endorse symptoms    4.  Goal(s): Refer for outpatient follow up (Follow up  appointment will be scheduled)   Met:  No  Target date: d/c  As evidenced by: Follow up appointment scheduled    Attendees: Patient:   01/14/2013 1:04 PM  Physican:  Patrick , MD 01/14/2013 1:04 PM  Nursing:  Neill Loft, RN 01/14/2013 1:04 PM   Nursing:    Quintella Reichert,  RN 01/14/2013 1:04 PM   Clinical Social Worker:  Juline Patch, LCSW 01/14/2013 1:04 PM   Other: Tera Helper, PHM-NP 01/14/2013 1:04 PM   Other:   01/14/2013 1:04 PM Other:        01/14/2013 1:04 PM

## 2013-01-14 NOTE — Progress Notes (Signed)
[facility] LCSW Group Therapy  01/14/2013 3:09 PM  Type of Therapy:  Group Therapy  Participation Level:  Minimal  Participation Quality:  Appropriate and Attentive  Affect:  Appropriate, Depressed and Flat  Cognitive:  Appropriate  Insight:  Limited  Engagement in Therapy:  Limited  Modes of Intervention:  Discussion, Education, Problem-solving, Rapport Building and Support  Summary of Progress/Problems:  Patient shared he is hurt and anger over finding out wife is seeing someone else.  He remained in group and listened attentively.  Wynn Banker 01/14/2013, 3:09 PM

## 2013-01-14 NOTE — Progress Notes (Signed)
[facility] LCSW Aftercare Discharge Planning Group Note  01/14/2013 3:09 PM  Participation Quality:  Patient did not attend group.  Wynn Banker 01/14/2013, 3:09 PM

## 2013-01-14 NOTE — Progress Notes (Signed)
[facility] Group Notes:  (Nursing/MHT/Case Management/Adjunct)  Date:  01/14/2013  Time:  11:51 PM  Type of Therapy:  Psychoeducational Skills  Participation Level:  Minimal  Participation Quality:  Inattentive  Affect:  Depressed  Cognitive:  Lacking  Insight:  Lacking  Engagement in Group:  Lacking  Modes of Intervention:  Education  Summary of Progress/Problems: The patient stated in group this evening that he had a bad day today. He would not elaborate any further. His goal for tomorrow is to "find answers". He would only share that he has some personal issues to work on.   Hazle Coca S 01/14/2013, 11:51 PM

## 2013-01-14 NOTE — Progress Notes (Signed)
Psychoeducational Group Note  Date:  01/14/2013 Time: 1000  Group Topic/Focus:  Therapeutic Activity- Recovery Goals  Participation Level: Did Not Attend  Participation Quality:  Not Applicable  Affect:  Not Applicable  Cognitive:  Not Applicable  Insight:  Not Applicable  Engagement in Group: Not Applicable  Additional Comments:  Patient did not attend group, patient remained in bed.  [name] 01/14/2013, 6:52 PM

## 2013-01-15 DIAGNOSIS — F411 Generalized anxiety disorder: Secondary | ICD-10-CM

## 2013-01-15 NOTE — Progress Notes (Signed)
  GOALS GROUP  The focus of this group is to help patients establish daily goals to achieve during treatment and discuss how the patient can incorporate goal setting into their daily lives to aide in recovery.   Date: 01/15/2013 Time:  0900  Participation Level:  Active  Participation Quality:  Attentive  Affect:  Appropriate  Cognitive:  Appropriate  Insight:  Engaged  Engagement in Group:  Engaged  Additional Comments:    Lalia Loudon A 

## 2013-01-15 NOTE — Progress Notes (Signed)
Writer observed patient in the dayroom playing cards with peers. Writer spoke with patient 1:1 at medication window to inquire as to how his day has been. Patient reports that his day has been awful because he has been trying to locate his wife. Patient reports that his wife was supposed to be at her grandmother's home but found out that she has been at an unknown males home. Patient reports that he has been worried and has had panic attacks due to this, his daughters are working on finding her. Patient was offered support and encouragement, c/o having racing thoughts but was unable to receive visteril due to his dose given earlier. Patient reports that he is basically waiting to hear from one of his daugther's concerning his wife. Patient reports that he lost it earlier today and is trying to do better with his anxiety. Safety maintained on unit, will continue to monitor.

## 2013-01-15 NOTE — Clinical Social Work Note (Signed)
[facility] Group Notes:  (Clinical Social Work)  01/15/2013   3:00-4:00PM  Summary of Progress/Problems:   The main focus of today's process group was for the patient to identify ways in which they have in the past sabotaged their own recovery and to discuss their motivation to change, as well as confidence that they can change. Cognitive Behavioral Therapy concepts about the interconnectedness of thoughts/feelings/actions was introduced, along with 2 short techniques of Thought Stopping and Thought Replacement.  The patient expressed that his self-sabotage thought is "I want the pain to stop" and that the techniques taught in this class were new to him.  He rated his motivation at a 7 out of 10, stating he would like to try these techniques.  Type of Therapy:  Process Group, Cognitive Behavior Therapy, Motivational Interviewing  Participation Level:  Active  Participation Quality:  Appropriate, Attentive and Sharing  Affect:  Appropriate and Blunted  Cognitive:  Oriented  Insight:  Engaged  Engagement in Therapy:  Engaged  Modes of Intervention:  Clarification, Education, Limit-setting, Problem-solving, Socialization, Support and Processing, Exploration, Discussion   Nathaniel Mantle, LCSW 01/15/2013, 5:36 PM

## 2013-01-15 NOTE — Progress Notes (Signed)
Patient ID: Nathaniel Warren, male   DOB: [birth year], 47 y.o.   MRN: [id number] [facility] MD Progress Note  01/15/2013 1:43 PM ILAY CAPSHAW  MRN:  [id number] Subjective:   Many losses this past year and now is concerned about his wife. Apparently she is with a man- he is trying not to have another panic attack until his daughters can speak to his wife and find out why she is with this man.  Remains hopeless. Diagnosis:   Axis I: Anxiety Disorder NOS and Depressive Disorder NOS Axis II: Deferred Axis III:  Past Medical History  Diagnosis Date  . Stroke    Axis IV: economic problems, housing problems, occupational problems, other psychosocial or environmental problems, problems related to social environment and problems with primary support group Axis V: 41-50 serious symptoms  ADL's:  Intact  Sleep: Poor  Appetite:  Fair  Suicidal Ideation:  Denies Homicidal Ideation:  Denies  Psychiatric Specialty Exam: Review of Systems  Constitutional: Negative.   HENT: Negative.   Eyes: Negative.   Respiratory: Negative.   Cardiovascular: Negative.   Gastrointestinal: Negative.   Genitourinary: Negative.   Skin: Negative.   Neurological: Negative.   Endo/Heme/Allergies: Negative.   Psychiatric/Behavioral: Positive for depression. The patient is nervous/anxious.     Blood pressure 113/74, pulse 82, temperature 97.4 F (36.3 C), temperature source Oral, resp. rate 18, height 5\' 9"  (1.753 m), weight 68.04 kg (150 lb).Body mass index is 22.14 kg/(m^2).  General Appearance: Casual  Eye Contact::  Fair  Speech:  Normal Rate  Volume:  Normal  Mood:  Anxious and Depressed  Affect:  Congruent  Thought Process:  Coherent  Orientation:  Full (Time, Place, and Person)  Thought Content:  WDL  Suicidal Thoughts:  No  Homicidal Thoughts:  No  Memory:  Immediate;   Fair Recent;   Fair Remote;   Fair  Judgement:  Impaired  Insight:  Fair  Psychomotor Activity:  Decreased  Concentration:  Fair   Recall:  Fair  Akathisia:  No  Handed:  Right  AIMS (if indicated):     Assets:  Communication Skills Desire for Improvement Resilience Social Support  Sleep:  Number of Hours: 6.5   Current Medications: Current Facility-Administered Medications  Medication Dose Route Frequency Provider Last Rate Last Dose  . alum & mag hydroxide-simeth (MAALOX/MYLANTA) 200-200-20 MG/5ML suspension 30 mL  30 mL Oral Q4H PRN Larena Sox, MD      . busPIRone (BUSPAR) tablet 10 mg  10 mg Oral TID Nanine Means, NP   10 mg at 01/15/13 9629  . gabapentin (NEURONTIN) capsule 300 mg  300 mg Oral QID Verne Spurr, PA-C   300 mg at 01/15/13 1204  . hydrOXYzine (ATARAX/VISTARIL) tablet 25 mg  25 mg Oral QID PRN Nanine Means, NP   25 mg at 01/15/13 1204  . ibuprofen (ADVIL,MOTRIN) tablet 800 mg  800 mg Oral Q6H PRN Nanine Means, NP   800 mg at 01/15/13 5284  . magnesium hydroxide (MILK OF MAGNESIA) suspension 30 mL  30 mL Oral Daily PRN Larena Sox, MD      . mirtazapine (REMERON) tablet 7.5 mg  7.5 mg Oral QHS Nanine Means, NP   7.5 mg at 01/14/13 2206  . multivitamin with minerals tablet 1 tablet  1 tablet Oral Daily Lavena Bullion, RD   1 tablet at 01/15/13 [id number]  . nicotine (NICODERM CQ - dosed in mg/24 hours) patch 21 mg  21 mg Transdermal Daily Himabindu Ravi,  MD   21 mg at 01/14/13 0702  . pantoprazole (PROTONIX) EC tablet 40 mg  40 mg Oral BID AC Nanine Means, NP   40 mg at 01/15/13 4098  . sertraline (ZOLOFT) tablet 50 mg  50 mg Oral Daily Himabindu Ravi, MD   50 mg at 01/15/13 0825    Lab Results: No results found for this or any previous visit (from the past 48 hour(s)).  Physical Findings: AIMS: Facial and Oral Movements Muscles of Facial Expression: None, normal Lips and Perioral Area: None, normal Jaw: None, normal Tongue: None, normal,Extremity Movements Upper (arms, wrists, hands, fingers): None, normal Lower (legs, knees, ankles, toes): None, normal, Trunk Movements Neck,  shoulders, hips: None, normal, Overall Severity Severity of abnormal movements (highest score from questions above): None, normal Incapacitation due to abnormal movements: None, normal Patient's awareness of abnormal movements (rate only patient's report): No Awareness, Dental Status Current problems with teeth and/or dentures?: No Does patient usually wear dentures?: No  CIWA:  CIWA-Ar Total: 0 COWS:  COWS Total Score: 0  Treatment Plan Summary: Daily contact with patient to assess and evaluate symptoms and progress in treatment Medication management  Plan:  Review of chart, vital signs, medications, and notes. 1-Individual and group therapy encouraged 2-Medication management for depression and anxiety:  Medications reviewed with the patient and he stated the trazodone was not working, switched to Remeron and vistaril increased to meet his sleep and anxiety needs (MD consulted with medication advice). 3-Coping skills for depression, anxiety, and pain--more one to one time today due to his recent new regarding his wife 4-Continue crisis stabilization and management 5-Address health issues--stable 6-Treatment plan in progress to prevent relapse of depression and anxiety   Medical Decision Making Problem Points:  New problem, with additional work-up planned (4) and Review of psycho-social stressors (1) Data Points:  Review of new medications or change in dosage (2)  I certify that inpatient services furnished can reasonably be expected to improve the patient's condition.   Quanta Roher,[name]  01/15/2013, 1:43 PM

## 2013-01-15 NOTE — Progress Notes (Signed)
Psychoeducational Group Note  Date: 01/15/2013 Time:  1015  Group Topic/Focus:  Identifying Needs:   The focus of this group is to help patients identify their personal needs that have been historically problematic and identify healthy behaviors to address their needs.  Participation Level:  Active  Participation Quality:  Appropriate  Affect:  Appropriate  Cognitive:  Appropriate  Insight:  Improving  Engagement in Group:  Engaged  Additional Comments:    Dione Housekeeper

## 2013-01-16 MED ORDER — MIRTAZAPINE 15 MG PO TABS
15.0000 mg | ORAL_TABLET | Freq: Every day | ORAL | Status: DC
  Administered 2013-01-16: 15 mg via ORAL
  Filled 2013-01-16 (×2): qty 1

## 2013-01-16 NOTE — Progress Notes (Signed)
D) Pt has attended the groups and interacts with select peers. Tends to spend a lot of time with two of the male Patients, but is appropriate. Is attentive in the groups and states that he is learning a lot about himself. Rates his depression at an 8 and his hopelessness at a 9. Continues to have constant thoughts of suicide. Is aware that his wife is cheating and feels A) Given support and reassurance along with praise R) Remains depressed and contracts for safety while on the unit.

## 2013-01-16 NOTE — Progress Notes (Signed)
Patient ID: Nathaniel Warren, male   DOB: [birth year], 47 y.o.   MRN: [id number] [facility] MD Progress Note  01/16/2013 11:15 AM Nathaniel Warren  MRN:  [id number] Subjective:   Poor insight & judgment. Was going to jump off a bridge the other day and yet he feels betrayed by wife's behavior. She won't call him.  Daughter's had put out a missing persons bulletin and the police found the wife/mother driving one of the daughter's car's. Will increase Remeron .  Diagnosis:   Axis I: Anxiety Disorder NOS and Depressive Disorder NOS Axis II: Deferred Axis III:  Past Medical History  Diagnosis Date  . Stroke    Axis IV: economic problems, housing problems, occupational problems, other psychosocial or environmental problems, problems related to social environment and problems with primary support group Axis V: 41-50 serious symptoms  ADL's:  Intact  Sleep: Poor  Appetite:  Fair  Suicidal Ideation:  Denies Homicidal Ideation:  Denies  Psychiatric Specialty Exam: Review of Systems  Constitutional: Negative.   HENT: Negative.   Eyes: Negative.   Respiratory: Negative.   Cardiovascular: Negative.   Gastrointestinal: Negative.   Genitourinary: Negative.   Skin: Negative.   Neurological: Negative.   Endo/Heme/Allergies: Negative.   Psychiatric/Behavioral: Positive for depression. The patient is nervous/anxious.     Blood pressure 124/84, pulse 72, temperature 97.2 F (36.2 C), temperature source Oral, resp. rate 18, height 5\' 9"  (1.753 m), weight 68.04 kg (150 lb).Body mass index is 22.14 kg/(m^2).  General Appearance: Casual  Eye Contact::  Fair  Speech:  Normal Rate  Volume:  Normal  Mood:  Anxious and Depressed  Affect:  Congruent  Thought Process:  Coherent  Orientation:  Full (Time, Place, and Person)  Thought Content:  WDL  Suicidal Thoughts:  No  Homicidal Thoughts:  No  Memory:  Immediate;   Fair Recent;   Fair Remote;   Fair  Judgement:  Impaired  Insight:  Fair   Psychomotor Activity:  Decreased  Concentration:  Fair  Recall:  Fair  Akathisia:  No  Handed:  Right  AIMS (if indicated):     Assets:  Communication Skills Desire for Improvement Resilience Social Support  Sleep:  Number of Hours: 6.25   Current Medications: Current Facility-Administered Medications  Medication Dose Route Frequency Provider Last Rate Last Dose  . alum & mag hydroxide-simeth (MAALOX/MYLANTA) 200-200-20 MG/5ML suspension 30 mL  30 mL Oral Q4H PRN Larena Sox, MD      . busPIRone (BUSPAR) tablet 10 mg  10 mg Oral TID Nanine Means, NP   10 mg at 01/16/13 9629  . gabapentin (NEURONTIN) capsule 300 mg  300 mg Oral QID Verne Spurr, PA-C   300 mg at 01/16/13 5284  . hydrOXYzine (ATARAX/VISTARIL) tablet 25 mg  25 mg Oral QID PRN Nanine Means, NP   25 mg at 01/16/13 0825  . ibuprofen (ADVIL,MOTRIN) tablet 800 mg  800 mg Oral Q6H PRN Nanine Means, NP   800 mg at 01/16/13 0631  . magnesium hydroxide (MILK OF MAGNESIA) suspension 30 mL  30 mL Oral Daily PRN Larena Sox, MD      . mirtazapine (REMERON) tablet 7.5 mg  7.5 mg Oral QHS Nanine Means, NP   7.5 mg at 01/15/13 2250  . multivitamin with minerals tablet 1 tablet  1 tablet Oral Daily Lavena Bullion, RD   1 tablet at 01/16/13 [id number]  . nicotine (NICODERM CQ - dosed in mg/24 hours) patch 21 mg  21  mg Transdermal Daily Himabindu Ravi, MD   21 mg at 01/16/13 0820  . pantoprazole (PROTONIX) EC tablet 40 mg  40 mg Oral BID AC Nanine Means, NP   40 mg at 01/16/13 0630  . sertraline (ZOLOFT) tablet 50 mg  50 mg Oral Daily Himabindu Ravi, MD   50 mg at 01/16/13 1610    Lab Results: No results found for this or any previous visit (from the past 48 hour(s)).  Physical Findings: AIMS: Facial and Oral Movements Muscles of Facial Expression: None, normal Lips and Perioral Area: None, normal Jaw: None, normal Tongue: None, normal,Extremity Movements Upper (arms, wrists, hands, fingers): None, normal Lower (legs, knees,  ankles, toes): None, normal, Trunk Movements Neck, shoulders, hips: None, normal, Overall Severity Severity of abnormal movements (highest score from questions above): None, normal Incapacitation due to abnormal movements: None, normal Patient's awareness of abnormal movements (rate only patient's report): No Awareness, Dental Status Current problems with teeth and/or dentures?: No Does patient usually wear dentures?: No  CIWA:  CIWA-Ar Total: 0 COWS:  COWS Total Score: 0  Treatment Plan Summary: Daily contact with patient to assess and evaluate symptoms and progress in treatment Medication management  Plan:  Review of chart, vital signs, medications, and notes. 1-Individual and group therapy encouraged 2-Medication management for depression and anxiety:  Medications reviewed with the patient and he stated the trazodone was not working, switched to Remeron and vistaril increased to meet his sleep and anxiety needs (MD consulted with medication advice). 3-Coping skills for depression, anxiety, and pain--more one to one time today due to his recent new regarding his wife 4-Continue crisis stabilization and management 5-Address health issues--stable 6-Treatment plan in progress to prevent relapse of depression and anxiety   Medical Decision Making Problem Points:  New problem, with additional work-up planned (4) and Review of psycho-social stressors (1) Data Points:  Review of new medications or change in dosage (2)  I certify that inpatient services furnished can reasonably be expected to improve the patient's condition.   Nathaniel Warren,[name]  01/16/2013, 11:15 AM

## 2013-01-16 NOTE — Clinical Social Work Note (Signed)
[facility] Group Notes: (Clinical Social Work)   01/16/2013      Type of Therapy:  Group Therapy   Participation Level:  Did Not Attend    Ambrose Mantle, LCSW 01/16/2013, 5:04 PM

## 2013-01-16 NOTE — Progress Notes (Signed)
Patient has been up and active, in dayroom interacting with peers appropriately. Patient attended group this evening. Writer spoke with patient 1:1 and patient c/o feeling anxious and feels that his medications are just not working. Writer encouraged patient to speak with his doctor on tomorrow concerning his medications. Patient was given a prn visteril to help with his anxiety and he requested ibuprofen for pain in both elbows he rated a 6. Patient reports not sleeping well last evening but writer recalls him sleeping fine last night. Support and encouragement offered, safety maintained on unit with 15 min checks, will continue to monitor.

## 2013-01-16 NOTE — Progress Notes (Signed)
Psychoeducational Group Note  Date:  01/16/2013 Time:  1015  Group Topic/Focus:  Making Healthy Choices:   The focus of this group is to help patients identify negative/unhealthy choices they were using prior to admission and identify positive/healthier coping strategies to replace them upon discharge.  Participation Level:  Active  Participation Quality:  Appropriate  Affect:  Appropriate  Cognitive:  Appropriate  Insight:  Improving  Engagement in Group:  Improving  Additional Comments:    Makye Radle A 01/16/2013 

## 2013-01-16 NOTE — Progress Notes (Signed)
Psychoeducational Group Note  Date:  01/16/2013 Time:  1015  Group Topic/Focus:  Making Healthy Choices:   The focus of this group is to help patients identify negative/unhealthy choices they were using prior to admission and identify positive/healthier coping strategies to replace them upon discharge.  Participation Level:  Active  Participation Quality:  Appropriate  Affect:  Appropriate  Cognitive:  Appropriate  Insight:  Improving  Engagement in Group:  Improving  Additional Comments:    Kitzia Camus A 01/16/2013 

## 2013-01-17 MED ORDER — POTASSIUM CHLORIDE CRYS ER 10 MEQ PO TBCR
10.0000 meq | EXTENDED_RELEASE_TABLET | Freq: Every day | ORAL | Status: DC
  Administered 2013-01-17 – 2013-01-21 (×5): 10 meq via ORAL
  Filled 2013-01-17 (×8): qty 1

## 2013-01-17 MED ORDER — TRAZODONE HCL 100 MG PO TABS
100.0000 mg | ORAL_TABLET | Freq: Every day | ORAL | Status: DC
  Administered 2013-01-17: 100 mg via ORAL
  Filled 2013-01-17 (×3): qty 1

## 2013-01-17 MED ORDER — HYDROCHLOROTHIAZIDE 25 MG PO TABS
25.0000 mg | ORAL_TABLET | Freq: Every day | ORAL | Status: DC
  Administered 2013-01-17 – 2013-01-21 (×5): 25 mg via ORAL
  Filled 2013-01-17 (×8): qty 1

## 2013-01-17 MED ORDER — CLONAZEPAM 1 MG PO TABS
1.0000 mg | ORAL_TABLET | Freq: Two times a day (BID) | ORAL | Status: DC | PRN
  Administered 2013-01-17 – 2013-01-21 (×9): 1 mg via ORAL
  Filled 2013-01-17 (×9): qty 1

## 2013-01-17 MED ORDER — TRAZODONE HCL 100 MG PO TABS
100.0000 mg | ORAL_TABLET | Freq: Every evening | ORAL | Status: DC | PRN
  Administered 2013-01-17 – 2013-01-19 (×5): 100 mg via ORAL
  Filled 2013-01-17 (×7): qty 1

## 2013-01-17 NOTE — Progress Notes (Addendum)
D:   Patient's self inventory sheet, patient has poor sleep, improving appetite, low energy level, improving attention span.  Rated depression and hopelessness #8.  Denied withdrawals.  SI off/on, contracts for safety.  Has had pain in past 24 hours.  Pain goal #2 today, worst pain #4.  "Taking my meds, getting a job and trying to cope with my illness" after discharge.  "My anxiety meds aren't working very well.  I have racing thoughts and shaking and very nervous all the time."  Cannot afford his meds after discharge. A:  Medications administered per MD order.  Staff will monitor every 15 minutes for safety.  Emotional support and encouragement given to patient. R:  Denied HI.   SI off/on, contracts for safety.  Denied A/V hallucinations at this time.   Patient has been playing cards in dayroom this afternoon, talking and laughing with peers.  Stated he is feeling much better now.  Went to dining room for meals today.  Has attended and participated in groups today.  Patient's wife visited him tonight for dinner.  Patient stated they talked and he feels better about his marital situation.

## 2013-01-17 NOTE — Clinical Social Work Note (Signed)
[facility] LCSW Aftercare Discharge Planning Group Note  01/17/2013 8:45 AM  Participation Quality:  Appropriate and Attentive  Affect:  Appropriate, Anxious  Cognitive:  Alert and Appropriate  Insight:  Developing/Improving  Engagement in Group:  Developing/Improving  Modes of Intervention:  Clarification, Discussion, Education, Exploration, Orientation, Problem-solving, Rapport Building, Socialization and Support  Summary of Progress/Problems: Pt attended discharge planning group and actively participated in group.  CSW provided pt with today's workbook.  Pt rates depression at an 8 and anxiety at a "12".  Pt endorses on and off SI and contracts for safety on the unit.  Pt states that he is very anxious about things going on at home.  Pt states that his d/c plans are to get a job and go home.  Pt has no outpatient providers and will need a referrals.  No further needs voiced by pt at this time.    Nathaniel Warren, LCSWA 01/17/2013 9:49 AM

## 2013-01-17 NOTE — Progress Notes (Signed)
[facility] MD Progress Note  01/17/2013 1:28 PM Nathaniel Warren  MRN:  [id number] Subjective:  Depression 9/10, anxiety 10/10, patient is having a very difficult time because his wife was staying with a man--supposedly it was business, his adult children took out a missing person's report and when they found her, they brought her home in a police car which she was not happy with--angry with the patient but he still hasn't gotten a chance to talk with her--Benji is extremely anxious and upset about the situation Diagnosis:   Axis I: Anxiety Disorder NOS and Depressive Disorder NOS Axis II: Deferred Axis III:  Past Medical History  Diagnosis Date  . Stroke    Axis IV: economic problems, housing problems, occupational problems, other psychosocial or environmental problems, problems related to social environment and problems with primary support group Axis V: 41-50 serious symptoms/  ADL's:  Intact  Sleep: Poor  Appetite:  Fair  Suicidal Ideation:  Denies Homicidal Ideation:  Denies  Psychiatric Specialty Exam: Review of Systems  Constitutional: Negative.   HENT: Negative.   Eyes: Negative.   Respiratory: Negative.   Cardiovascular: Negative.   Gastrointestinal: Negative.   Genitourinary: Negative.   Musculoskeletal: Negative.   Skin: Negative.   Neurological: Negative.   Endo/Heme/Allergies: Negative.   Psychiatric/Behavioral: Positive for depression. The patient is nervous/anxious.     Blood pressure 164/94, pulse 65, temperature 97.7 F (36.5 C), temperature source Oral, resp. rate 18, height 5\' 9"  (1.753 m), weight 68.04 kg (150 lb).Body mass index is 22.14 kg/(m^2).  General Appearance: Casual  Eye Contact::  Fair  Speech:  Normal Rate  Volume:  Normal  Mood:  Anxious and Depressed  Affect:  Congruent  Thought Process:  Coherent  Orientation:  Full (Time, Place, and Person)  Thought Content:  WDL  Suicidal Thoughts:  No  Homicidal Thoughts:  No  Memory:  Immediate;    Fair Recent;   Fair Remote;   Fair  Judgement:  Impaired  Insight:  Fair  Psychomotor Activity:  Decreased  Concentration:  Fair  Recall:  Fair  Akathisia:  No  Handed:  Right  AIMS (if indicated):     Assets:  Communication Skills Desire for Improvement Resilience Social Support  Sleep:  Number of Hours: 6.25   Current Medications: Current Facility-Administered Medications  Medication Dose Route Frequency Provider Last Rate Last Dose  . alum & mag hydroxide-simeth (MAALOX/MYLANTA) 200-200-20 MG/5ML suspension 30 mL  30 mL Oral Q4H PRN Larena Sox, MD      . busPIRone (BUSPAR) tablet 10 mg  10 mg Oral TID Nanine Means, NP   10 mg at 01/17/13 1122  . clonazePAM (KLONOPIN) tablet 1 mg  1 mg Oral BID PRN Nanine Means, NP      . gabapentin (NEURONTIN) capsule 300 mg  300 mg Oral QID Verne Spurr, PA-C   300 mg at 01/17/13 1122  . hydrochlorothiazide (HYDRODIURIL) tablet 25 mg  25 mg Oral Daily Nanine Means, NP   25 mg at 01/17/13 1121  . hydrOXYzine (ATARAX/VISTARIL) tablet 25 mg  25 mg Oral QID PRN Nanine Means, NP   25 mg at 01/17/13 4098  . ibuprofen (ADVIL,MOTRIN) tablet 800 mg  800 mg Oral Q6H PRN Nanine Means, NP   800 mg at 01/17/13 1191  . magnesium hydroxide (MILK OF MAGNESIA) suspension 30 mL  30 mL Oral Daily PRN Larena Sox, MD      . multivitamin with minerals tablet 1 tablet  1 tablet Oral  Daily Lavena Bullion, RD   1 tablet at 01/17/13 0820  . nicotine (NICODERM CQ - dosed in mg/24 hours) patch 21 mg  21 mg Transdermal Daily Himabindu Ravi, MD   21 mg at 01/17/13 9604  . pantoprazole (PROTONIX) EC tablet 40 mg  40 mg Oral BID AC Nanine Means, NP   40 mg at 01/17/13 5409  . potassium chloride (K-DUR,KLOR-CON) CR tablet 10 mEq  10 mEq Oral Daily Nanine Means, NP   10 mEq at 01/17/13 0947  . sertraline (ZOLOFT) tablet 50 mg  50 mg Oral Daily Himabindu Ravi, MD   50 mg at 01/17/13 0820  . traZODone (DESYREL) tablet 100 mg  100 mg Oral QHS Nanine Means, NP         Lab Results: No results found for this or any previous visit (from the past 48 hour(s)).  Physical Findings: AIMS: Facial and Oral Movements Muscles of Facial Expression: None, normal Lips and Perioral Area: None, normal Jaw: None, normal Tongue: None, normal,Extremity Movements Upper (arms, wrists, hands, fingers): None, normal Lower (legs, knees, ankles, toes): None, normal, Trunk Movements Neck, shoulders, hips: None, normal, Overall Severity Severity of abnormal movements (highest score from questions above): None, normal Incapacitation due to abnormal movements: None, normal Patient's awareness of abnormal movements (rate only patient's report): No Awareness, Dental Status Current problems with teeth and/or dentures?: No Does patient usually wear dentures?: No  CIWA:  CIWA-Ar Total: 0 COWS:  COWS Total Score: 0  Treatment Plan Summary: Daily contact with patient to assess and evaluate symptoms and progress in treatment Medication management   Plan:  Review of chart, vital signs, medications, and notes. 1-Individual and group therapy 2-Medication management for depression and anxiety:  Medications reviewed with the patient and he requested his trazodone back for sleep, Remeron discontinued and trazodone reordered, PRN klonopin ordered for anxiety--patient is very stressed due to personal issues with his wife 3-Coping skills for depression and anxiety 4-Continue crisis stabilization and management 5-Address health issues--monitoring blood pressures, elevated with stress--temporarily ordered HCTZ, will continue this regiment until his anxiety and blood pressures decrease to baseling 6-Treatment plan in progress to prevent relapse of depression and anxiety  Medical Decision Making Problem Points:  Established problem, stable/improving (1) and Review of psycho-social stressors (1) Data Points:  Review of new medications or change in dosage (2)  I certify that inpatient  services furnished can reasonably be expected to improve the patient's condition.   Nanine Means, PMH-NP 01/17/2013, 1:28 PM

## 2013-01-17 NOTE — Progress Notes (Signed)
Recreation Therapy Notes  Date: 02.10.2014 Time: 3:00pm      Group Topic/Focus: AAA  Participation Level: Active  Participation Quality: Appropriate  Affect: Appropriate  Cognitive: Appropriate   Additional Comments: Patient pet and visited with [place] the dog.  Gwenyth Dingee  L Darreld Hoffer, LRT/CTRS

## 2013-01-17 NOTE — Progress Notes (Signed)
[facility] Group Notes:  (Nursing/MHT/Case Management/Adjunct)  Date:  01/16/2013 Time:  2000  Type of Therapy:  Psychoeducational Skills  Participation Level:  Minimal  Participation Quality:  Inattentive  Affect:  Flat  Cognitive:  Lacking  Insight:  Lacking  Engagement in Group:  Lacking  Modes of Intervention:  Education  Summary of Progress/Problems: The patient described his day as having been "alright". He would not expound on that any further despite being observed talking freely with his peers prior to group, laughing, etc. His goal for tomorrow is to "try to get my meds straight".   Hazle Coca S 01/17/2013, 12:23 AM

## 2013-01-17 NOTE — Progress Notes (Signed)
Patient ID: Nathaniel Warren, male   DOB: May 17, 1966, 47 y.o.   MRN: [id number] D:  Client noted on the unit interacting and playing cards. Pt. Reports Depression "7" of 10 and anxiety "9" of 10. He reports SI "off and on", but reports he would let staff know if thought became a plan. A: Writer encourage client to feel free to express feelings. Staff will monitor q61min for safety. Pt. Encouraged to attend group. R: Pt. Is safe on the unit. Pt. Attended group and participated.

## 2013-01-17 NOTE — Progress Notes (Signed)
[facility] Group Notes:  (Nursing/MHT/Case Management/Adjunct)  Date:  01/17/2013  Time:  10:01 PM  Type of Therapy:  Psychoeducational Skills  Participation Level:  Active  Participation Quality:  Appropriate  Affect:  Anxious  Cognitive:  Alert  Insight:  Good  Engagement in Group:  Engaged  Modes of Intervention:  Exploration  Summary of Progress/Problems: Patient attended wrap-up group this evening. Patient reported that his day got better as it went along. His goal was to "get through the day." Patient visited with his wife this evening which went well. He is very active on the unit and very supportive of peers.   Fransisca Kaufmann ANN 01/17/2013, 10:01 PM

## 2013-01-17 NOTE — Progress Notes (Signed)
Adult Psychoeducational Group Note  Date:  01/17/2013 Time:  3:22 PM  Group Topic/Focus:  Self Care:   The focus of this group is to help patients understand the importance of self-care in order to improve or restore emotional, physical, spiritual, interpersonal, and financial health.  Participation Level:  Active  Participation Quality:  Appropriate and Attentive  Affect:  Appropriate  Cognitive:  Alert and Appropriate  Insight: Appropriate  Engagement in Group:  Engaged  Modes of Intervention:  Discussion  Additional Comments:  Pt was appropriate and attentive while attending group.Pt stated that things he does well is making time away from telephone, e-mail, and Internet. Pt also stated that he wants to work on having his own personal psychotherapy.   Sharyn Lull 01/17/2013, 3:22 PM

## 2013-01-17 NOTE — Progress Notes (Signed)
[facility] LCSW Group Therapy  01/17/2013 2:15pm  Type of Therapy:  Group Therapy  Participation Level:  Active  Participation Quality:  Attentive, Sharing and Supportive  Affect:  Appropriate  Cognitive:  Alert and Oriented  Insight:  Developing/Improving  Engagement in Therapy:  Developing/Improving and Engaged  Modes of Intervention:  Activity, Discussion, Socialization and Support  Summary of Progress/Problems: Today's group focused on the topic of "Overcoming Obstacles". Patients were encouraged to explore what they see as obstacles to their own wellness and recovery. Patient was challenged to identify changes they are motivated to make in order to overcome their obstacles. Patient verbalized that negative relationships can be potential obstacles for him. Patient stated that he becomes upset when people state "What can I do to help?" if they are not genuine in their delivery. Patient stated that he is working on surrounding himself with positive people just as he has done during his treatment here. Patient was observed to be in a positive mood and stable mood.   Haskel Khan 01/17/2013, 6:22 PM

## 2013-01-18 MED ORDER — SERTRALINE HCL 25 MG PO TABS
75.0000 mg | ORAL_TABLET | Freq: Every day | ORAL | Status: DC
  Administered 2013-01-19: 75 mg via ORAL
  Filled 2013-01-18 (×2): qty 3

## 2013-01-18 NOTE — Progress Notes (Signed)
[facility] MD Progress Note  01/18/2013 10:48 AM Nathaniel Warren  MRN:  [id number] Subjective:  Patient reports feeling anxious. Continues to be depressed and having intermittent suicidal thoughts. He reports having thoughts of hanging himself. Tolerating medications well.  Diagnosis:   Axis I: Major Depression, Recurrent severe Axis II: Deferred Axis III:  Past Medical History  Diagnosis Date  . Stroke    Axis IV: housing problems, occupational problems and other psychosocial or environmental problems Axis V: 41-50 serious symptoms  ADL's:  Intact  Sleep: Fair  Appetite:  Fair   Psychiatric Specialty Exam: Review of Systems  Constitutional: Negative.   HENT: Negative.   Eyes: Negative.   Respiratory: Negative.   Cardiovascular: Negative.   Gastrointestinal: Negative.   Genitourinary: Negative.   Musculoskeletal: Negative.   Skin: Negative.   Neurological: Negative.   Endo/Heme/Allergies: Negative.   Psychiatric/Behavioral: Positive for depression and suicidal ideas. The patient is nervous/anxious.     Blood pressure 101/75, pulse 63, temperature 96.7 F (35.9 C), temperature source Oral, resp. rate 18, height 5\' 9"  (1.753 m), weight 68.04 kg (150 lb).Body mass index is 22.14 kg/(m^2).  General Appearance: Disheveled  Eye Solicitor::  Fair  Speech:  tremulous  Volume:  Increased  Mood:  Anxious, Depressed and Dysphoric  Affect:  Congruent and Constricted  Thought Process:  Coherent  Orientation:  Full (Time, Place, and Person)  Thought Content:  WDL and Rumination  Suicidal Thoughts:  Yes.  with intent/plan  Homicidal Thoughts:  No  Memory:  Immediate;   Fair Recent;   Fair Remote;   Fair  Judgement:  Impaired  Insight:  Present  Psychomotor Activity:  Decreased  Concentration:  Fair  Recall:  Fair  Akathisia:  No  Handed:  Right  AIMS (if indicated):     Assets:  Communication Skills Desire for Improvement Social Support  Sleep:  Number of Hours: 5.5    Current Medications: Current Facility-Administered Medications  Medication Dose Route Frequency Provider Last Rate Last Dose  . alum & mag hydroxide-simeth (MAALOX/MYLANTA) 200-200-20 MG/5ML suspension 30 mL  30 mL Oral Q4H PRN Larena Sox, MD      . busPIRone (BUSPAR) tablet 10 mg  10 mg Oral TID Nanine Means, NP   10 mg at 01/18/13 0801  . clonazePAM (KLONOPIN) tablet 1 mg  1 mg Oral BID PRN Nanine Means, NP   1 mg at 01/17/13 2120  . gabapentin (NEURONTIN) capsule 300 mg  300 mg Oral QID Verne Spurr, PA-C   300 mg at 01/18/13 0802  . hydrochlorothiazide (HYDRODIURIL) tablet 25 mg  25 mg Oral Daily Nanine Means, NP   25 mg at 01/18/13 0802  . hydrOXYzine (ATARAX/VISTARIL) tablet 25 mg  25 mg Oral QID PRN Nanine Means, NP   25 mg at 01/18/13 0644  . ibuprofen (ADVIL,MOTRIN) tablet 800 mg  800 mg Oral Q6H PRN Nanine Means, NP   800 mg at 01/18/13 0645  . magnesium hydroxide (MILK OF MAGNESIA) suspension 30 mL  30 mL Oral Daily PRN Larena Sox, MD      . multivitamin with minerals tablet 1 tablet  1 tablet Oral Daily Lavena Bullion, RD   1 tablet at 01/18/13 0802  . nicotine (NICODERM CQ - dosed in mg/24 hours) patch 21 mg  21 mg Transdermal Daily Shyah Cadmus, MD   21 mg at 01/18/13 0646  . pantoprazole (PROTONIX) EC tablet 40 mg  40 mg Oral BID AC Nanine Means, NP  40 mg at 01/18/13 0644  . potassium chloride (K-DUR,KLOR-CON) CR tablet 10 mEq  10 mEq Oral Daily Nanine Means, NP   10 mEq at 01/18/13 0802  . [START ON 01/19/2013] sertraline (ZOLOFT) tablet 75 mg  75 mg Oral Daily Antion Andres, MD      . traZODone (DESYREL) tablet 100 mg  100 mg Oral QHS,MR X 1 Kerry Hough, PA   100 mg at 01/17/13 2322    Lab Results: No results found for this or any previous visit (from the past 48 hour(s)).  Physical Findings: AIMS: Facial and Oral Movements Muscles of Facial Expression: None, normal Lips and Perioral Area: None, normal Jaw: None, normal Tongue: None,  normal,Extremity Movements Upper (arms, wrists, hands, fingers): None, normal Lower (legs, knees, ankles, toes): None, normal, Trunk Movements Neck, shoulders, hips: None, normal, Overall Severity Severity of abnormal movements (highest score from questions above): None, normal Incapacitation due to abnormal movements: None, normal Patient's awareness of abnormal movements (rate only patient's report): No Awareness, Dental Status Current problems with teeth and/or dentures?: No Does patient usually wear dentures?: No  CIWA:  CIWA-Ar Total: 0 COWS:  COWS Total Score: 1  Treatment Plan Summary: Daily contact with patient to assess and evaluate symptoms and progress in treatment Medication management  Plan: Increase Zoloft to 75mg  to target mood symptoms. Patient made aware of side effects and benefits. Discussed with patient obtaining coping skills. Continue current plan of care.  Medical Decision Making Problem Points:  Established problem, stable/improving (1), Review of last therapy session (1) and Review of psycho-social stressors (1) Data Points:  Review of medication regiment & side effects (2) Review of new medications or change in dosage (2)  I certify that inpatient services furnished can reasonably be expected to improve the patient's condition.   Sahmya Arai 01/18/2013, 10:48 AM

## 2013-01-18 NOTE — Progress Notes (Signed)
[facility] LCSW Group Therapy       Feelings About Diagnosis 1:15 - 2:30 PM          01/18/2013 3:50 PM  Type of Therapy:  Group Therapy  Participation Level:  Active  Participation Quality:  Appropriate  Affect:  Appropriate  Cognitive:  Alert and Appropriate  Insight:  Engaged  Engagement in Therapy:  Engaged  Modes of Intervention:  Discussion, Exploration, Problem-solving, Rapport Building and Support  Summary of Progress/Problems:  Patient shared this is his first experience with depression and hospitalization.  He stated he was terrified, ashamed and embarrassed initially, but has learned there is nothing to be ashamed of. He shared has found this a positive experience where people are open and share there feelings and experiences with each other.  Wynn Banker 01/18/2013, 3:50 PM

## 2013-01-18 NOTE — Progress Notes (Signed)
Pt reports he is doing ok today, but he still has passive thoughts of suicide that come and go.  Pt is observed laughing and socializing with his peers, but then he regularly asks for medications for anxiety, saying he has racing thoughts, esp at night.  Pt makes his needs known to staff.  He denies HI/AV at this time.  Support/encouragement given.  Safety maintained with q15 minute checks.

## 2013-01-18 NOTE — Progress Notes (Signed)
Adult Psychoeducational Group Note  Date:  01/18/2013 Time:  1:52 PM  Group Topic/Focus:Psychoeducational Group    Participation Level:  Active  Participation Quality:  Appropriate, Sharing and Supportive  Affect:  Excited  Cognitive:  Alert and Appropriate  Insight: Good  Engagement in Group:  Engaged and Improving  Modes of Intervention:  Activity  Additional Comments:    Barton Fanny 01/18/2013, 1:52 PM

## 2013-01-18 NOTE — Progress Notes (Signed)
Reviewed

## 2013-01-18 NOTE — Progress Notes (Signed)
Grief and Loss Group  Group discussed losses in their life, including the loss of significant relationships, material losses, and the loss of self. Coping strategies were discussed.   Pt. Discussed the loss his material possessions and his job. Pt. Discussed the preservation of his protector role in providing for his children. Pt. Stated that maintaining his protector role provided him with stability.   Sherol Dade Counselor Intern Haroldine Laws

## 2013-01-18 NOTE — Tx Team (Signed)
Interdisciplinary Treatment Plan Update (Adult)  Date:  01/18/2013  Time Reviewed:  5:06 PM   Progress in Treatment: Attending groups:   Yes   Participating in groups:  Yes Taking medication as prescribed:  Yes Tolerating medication:  Yes Family/Significant othe contact made: Contact to be made with family Patient understands diagnosis:  Yes  As evidenced by asking for help with mood stabilization Discussing patient identified problems/goals with staff: Yes  See below Medical problems stabilized or resolved: Yes Denies suicidal/homicidal ideation: No Issues/concerns per patient self-inventory: Rates depression and anxiety at an 8 Other:   New problem(s) identified:  Reason for Continuation of Hospitalization: Anxiety Depression Medication stabilization Suicidal ideation  Interventions implemented related to continuation of hospitalization: Buspar/Zololfr/Neurontin/Trazodone intervention  safety checks q 15 mins  Encourage group attendance and participation  Additional comments: [name] has consistently complained of extreme anxiety  His medication is being adjusted on a daily basis  Estimated length of stay:  2-3 days  Discharge Plan: Shelter with outpatient follow up  New goal(s):  Review of initial/current patient goals per problem list:    1.  Goal(s): Eliminate SI/other thoughts of self harm (Patient will no longer endorse SI/HI or thoughts of self harm)   Met:  No  Target date: 2/14  As evidenced by: Self report    2.  Goal (s):Reduce depression/anxietywith the introduction of medication/therapeutic milieu  Met: No  Target date: 2/14  As evidenced by: Patient rates symptoms at 8    3.  Goal(s):.stabilize on meds (Patient will report being stabilized on medications)   Met:  No  Target date: 2/14  As evidenced by: Patient continues to endorse symptoms    4.  Goal(s): Refer for outpatient follow up (Follow up appointment will be scheduled)   Met:   No  Target date: d/c  As evidenced by: Follow up appointment scheduled    Attendees: Patient:   01/18/2013 5:06 PM  Physican:  Patrick Jaquala Fuller, MD 01/18/2013 5:06 PM  Nursing:  Neill Loft, RN 01/18/2013 5:06 PM   Nursing:    Quintella Reichert,  RN 01/18/2013 5:06 PM   Clinical Social Worker:  Richelle Ito LCSW 01/18/2013 5:06 PM   Other: Tera Helper, PHM-NP 01/18/2013 5:06 PM   Other:   01/18/2013 5:06 PM Other:        01/18/2013 5:06 PM

## 2013-01-18 NOTE — Progress Notes (Signed)
Reviewed. Patient seen and assessed. Recommendations discussed with NP.

## 2013-01-18 NOTE — Progress Notes (Signed)
[facility] LCSW Aftercare Discharge Planning Group Note  01/18/2013 1:10 PM  Participation Quality:  Appropriate and Attentive  Affect:  Appropriate and Depressed  Cognitive:  Appropriate  Insight:  Engaged  Engagement in Group:  Engaged  Modes of Intervention:  Education, Exploration, Dentist, Rapport Building and Support  Summary of Progress/Problems:  Patient reports being okay today.  He endorses off/on SI but able to contract for safety.  He rated depression at eight and anxiety at nine.  Patient shared wife visited yesterday but did not want to talk about the visit.  Nathaniel Warren 01/18/2013, 1:10 PM

## 2013-01-18 NOTE — Progress Notes (Signed)
D:  Patient's self inventory sheet, patient has fair sleep, improving appetite, low energy level, poor attention span.  Rated depression, hopelessness #8, anxiety #9.  Denied withdrawals.   SI off/on, contracts for safety.  Has experienced pain in pat 24 hours, pain goal #2, worst pain #5.  After discharge, plans to find a place to live, find a job and take meds.  "I've got racing thoughts, when I close my eyes it's like flash bulbs going off in my head."  Cannot afford meds after discharge.  Wants to stay on his meds.  Denied HI.   Denied A/V hallucinations. A:  Staff will monitor every 15 minutes for safety.   Emotional support and encouragement given to patient.   R:  Denied HI.   Denied A/V hallucinations.  Patient remains safe on unit.  SI off/on, contracts for safety.   Patient reached under chair to get his drink during group and scratched thumb;  Nurse cleaned area, applied antibiotic and bandaid.  N.P. Informed.   Patient stated his thumb is fine, no problems.

## 2013-01-19 MED ORDER — SERTRALINE HCL 100 MG PO TABS
100.0000 mg | ORAL_TABLET | Freq: Every day | ORAL | Status: DC
  Administered 2013-01-20 – 2013-01-21 (×2): 100 mg via ORAL
  Filled 2013-01-19 (×3): qty 1
  Filled 2013-01-19: qty 14

## 2013-01-19 NOTE — Progress Notes (Signed)
[facility] Group Notes:  (Counselor/Nursing/MHT/Case Management/Adjunct)  Type of Therapy:  Psychoeducational Skills  Participation Level:  Minimal  Participation Quality:  Attentive, Redirectable and Resistant  Affect:  Depressed  Cognitive:  Alert, Appropriate and Oriented  Insight:  Improving  Engagement in Group:  Developing/Improving  Modes of Intervention:  Activity, Discussion, Education, Problem-solving, Rapport Building, Socialization and Support  Summary of Progress/Problems: Buryl attended psychoeducational group that focused on using quality time with support systems/individuals to engage in health coping skills. Dewon participated in activity guessing about self and peers. Kailer was active while group discussed who their support systems are, how they can spend positive quality time with them as a coping skills and a way to strengthen their relationship. Jahaan stated his support system includes family and friends and that some people in his life enable him to unhealthy behaviors. Kivon was given a homework assignment to find two ways to improve his support systems and twenty activities he can do to spend quality time with his supports.   Wandra Scot 01/19/2013 3:18 PM

## 2013-01-19 NOTE — Progress Notes (Signed)
Recreation Therapy Notes  01/19/2013         Time: 3:00pm      Group Topic/Focus:Time Management  Participation Level: Did not attend  Jearl Klinefelter, LRT/CTRS   Jearl Klinefelter 01/19/2013 3:46 PM

## 2013-01-19 NOTE — Progress Notes (Signed)
[facility] LCSW Group Therapy       Emotional Regulation 1:15 - 2:30 PM          01/19/2013 2:04 PM  Type of Therapy:  Group Therapy  Participation Level:  Active  Participation Quality:  Appropriate  Affect:  Appropriate  Cognitive:  Alert and Appropriate  Insight:  Engaged  Engagement in Therapy:  Engaged  Modes of Intervention:  Discussion, Exploration, Problem-solving, Rapport Building and Support  Summary of Progress/Problems:  Patient shared he is having difficult with feeling his children are being taken advantage of.  He stated he can work to change his situation so that he can take care of his children. Wynn Banker 01/19/2013, 2:04 PM

## 2013-01-19 NOTE — Progress Notes (Signed)
D: Patient appropriate and cooperative with staff and peers. Patient's affect/mood is depressed and anxious. Patient complained of bilateral elbow pain 6/10; also c/o anxiety. He reported on the self inventory sheet that his appetite is improving, energy level is low, and ability to pay attention is poor. Patient rated depression and feelings of hopelessness "8".  A: Support and encouragement provided to patient. Scheduled medications administered per ordering MD. PRN Ibuprofen given for pain and Vistaril for anxiety. Maintain Q15 minute checks for safety.  R: Patient receptive. Passive SI, but contracts for safety. Denies HI/AVH. Patient remains safe on the unit.

## 2013-01-19 NOTE — Progress Notes (Signed)
[facility] MD Progress Note  01/19/2013 9:31 AM Nathaniel Warren  MRN:  [id number] Subjective:  9/10 depression, 10/10 anxiety but not shaking and sitting calmly--appears to be his baseline from recent observations, suicidal thoughts at time with plans, still functions in the mileu, attends groups without encouragement, easily engages in conversation and affect much bright, denies physical issues, upset still about his relationship issues with his wife, spoke to her on the phone last night and it did not go to well, doesn't have a place to go after discharge--wants to stay here  Diagnosis:   Axis I: Alcohol Abuse and Depressive Disorder NOS Axis II: Deferred Axis III:  Past Medical History  Diagnosis Date  . Stroke    Axis IV: economic problems, housing problems, occupational problems, other psychosocial or environmental problems, problems related to social environment and problems with primary support group Axis V: 41-50 serious symptoms  ADL's:  Intact  Sleep: Good  Appetite:  Good  Suicidal Ideation:  Plan:  hang himself or walk into traffic Intent:  None Means:  None Homicidal Ideation:  Denies  Psychiatric Specialty Exam: Review of Systems  Constitutional: Negative.   HENT: Negative.   Eyes: Negative.   Respiratory: Negative.   Cardiovascular: Negative.   Gastrointestinal: Negative.   Genitourinary: Negative.   Musculoskeletal: Negative.   Skin: Negative.   Neurological: Negative.   Endo/Heme/Allergies: Negative.   Psychiatric/Behavioral: Positive for depression and suicidal ideas. The patient is nervous/anxious.     Blood pressure 116/82, pulse 84, temperature 97 F (36.1 C), temperature source Oral, resp. rate 20, height 5\' 9"  (1.753 m), weight 68.04 kg (150 lb).Body mass index is 22.14 kg/(m^2).  General Appearance: Casual  Eye Contact::  Fair  Speech:  Normal Rate  Volume:  Normal  Mood:  Anxious and Depressed  Affect:  Non-Congruent  Thought Process:  Coherent   Orientation:  Full (Time, Place, and Person)  Thought Content:  WDL  Suicidal Thoughts:  Yes, no intent  Homicidal Thoughts:  No  Memory:  Immediate;   Fair Recent;   Fair Remote;   Fair  Judgement:  Fair  Insight:  Fair  Psychomotor Activity:  Normal  Concentration:  Fair  Recall:  Fair  Akathisia:  No  Handed:  Right  AIMS (if indicated):     Assets:  Communication Skills Resilience Social Support  Sleep:  Number of Hours: 5.5   Current Medications: Current Facility-Administered Medications  Medication Dose Route Frequency Provider Last Rate Last Dose  . alum & mag hydroxide-simeth (MAALOX/MYLANTA) 200-200-20 MG/5ML suspension 30 mL  30 mL Oral Q4H PRN Larena Sox, MD      . busPIRone (BUSPAR) tablet 10 mg  10 mg Oral TID Nanine Means, NP   10 mg at 01/19/13 0752  . clonazePAM (KLONOPIN) tablet 1 mg  1 mg Oral BID PRN Nanine Means, NP   1 mg at 01/18/13 2132  . gabapentin (NEURONTIN) capsule 300 mg  300 mg Oral QID Verne Spurr, PA-C   300 mg at 01/19/13 0751  . hydrochlorothiazide (HYDRODIURIL) tablet 25 mg  25 mg Oral Daily Nanine Means, NP   25 mg at 01/19/13 0752  . hydrOXYzine (ATARAX/VISTARIL) tablet 25 mg  25 mg Oral QID PRN Nanine Means, NP   25 mg at 01/19/13 0640  . ibuprofen (ADVIL,MOTRIN) tablet 800 mg  800 mg Oral Q6H PRN Nanine Means, NP   800 mg at 01/19/13 0640  . magnesium hydroxide (MILK OF MAGNESIA) suspension 30 mL  30  mL Oral Daily PRN Larena Sox, MD      . multivitamin with minerals tablet 1 tablet  1 tablet Oral Daily Lavena Bullion, RD   1 tablet at 01/19/13 0752  . nicotine (NICODERM CQ - dosed in mg/24 hours) patch 21 mg  21 mg Transdermal Daily Himabindu Ravi, MD   21 mg at 01/19/13 0644  . pantoprazole (PROTONIX) EC tablet 40 mg  40 mg Oral BID AC Nanine Means, NP   40 mg at 01/19/13 0641  . potassium chloride (K-DUR,KLOR-CON) CR tablet 10 mEq  10 mEq Oral Daily Nanine Means, NP   10 mEq at 01/19/13 0752  . sertraline (ZOLOFT) tablet 75  mg  75 mg Oral Daily Himabindu Ravi, MD   75 mg at 01/19/13 0753  . traZODone (DESYREL) tablet 100 mg  100 mg Oral QHS,MR X 1 Kerry Hough, PA   100 mg at 01/18/13 2231    Lab Results: No results found for this or any previous visit (from the past 48 hour(s)).  Physical Findings: AIMS: Facial and Oral Movements Muscles of Facial Expression: None, normal Lips and Perioral Area: None, normal Jaw: None, normal Tongue: None, normal,Extremity Movements Upper (arms, wrists, hands, fingers): None, normal Lower (legs, knees, ankles, toes): None, normal, Trunk Movements Neck, shoulders, hips: None, normal, Overall Severity Severity of abnormal movements (highest score from questions above): None, normal Incapacitation due to abnormal movements: None, normal Patient's awareness of abnormal movements (rate only patient's report): No Awareness, Dental Status Current problems with teeth and/or dentures?: No Does patient usually wear dentures?: No  CIWA:  CIWA-Ar Total: 0 COWS:  COWS Total Score: 0  Treatment Plan Summary: Daily contact with patient to assess and evaluate symptoms and progress in treatment Medication management  Plan:  Review of chart, vital signs, medications, and notes.  LOS:  1-3 days 1-Individual and group therapy 2-Medication management for depression and anxiety:  Medications reviewed with the patient and he stated no adverse side effects 3-Coping skills for depression and anxiety 4-Continue crisis stabilization and management 5-Address health issues--stable 6-Treatment plan in progress to prevent relapse of depression and anxiety  Medical Decision Making Problem Points:  Established problem, stable/improving (1) and Review of psycho-social stressors (1) Data Points:  Review of medication regiment & side effects (2)  I certify that inpatient services furnished can reasonably be expected to improve the patient's condition.   Nanine Means, PMH-NP 01/19/2013, 9:31  AM

## 2013-01-19 NOTE — Progress Notes (Signed)
Adult Psychoeducational Group Note  Date:  01/19/2013 Time:  11:58 AM  Group Topic/Focus:  Primary and Secondary Emotions:   The focus of this group is to discuss the difference between primary and secondary emotions.  Participation Level:  Active  Participation Quality:  Attentive  Affect:  Appropriate  Cognitive:  Appropriate and Oriented  Insight: Good  Engagement in Group:  Engaged  Modes of Intervention:  Discussion, Education and Support  Additional Comments:  Pt is working on medication adjustment and his depression. Pt volunteered to read during group and contributed to the group discussion.  Tynetta Bachmann T 01/19/2013, 11:58 AM

## 2013-01-20 DIAGNOSIS — F1010 Alcohol abuse, uncomplicated: Secondary | ICD-10-CM

## 2013-01-20 DIAGNOSIS — F329 Major depressive disorder, single episode, unspecified: Principal | ICD-10-CM

## 2013-01-20 MED ORDER — QUETIAPINE FUMARATE 100 MG PO TABS
100.0000 mg | ORAL_TABLET | Freq: Every day | ORAL | Status: DC
  Administered 2013-01-20: 100 mg via ORAL
  Filled 2013-01-20: qty 14
  Filled 2013-01-20 (×2): qty 1

## 2013-01-20 MED ORDER — QUETIAPINE FUMARATE 50 MG PO TABS: 50.0000 mg | ORAL_TABLET | Freq: Every day | ORAL | Status: DC

## 2013-01-20 NOTE — Progress Notes (Signed)
D: Patient reported having a rough day, he appeared anxious and sad. Stated ;"I'm worried about discharge tomorrow, I have no where to go". He requested for Vistaril for anxiety. He endorsed passive SI, although verbally contracted for safety. Denied A/V hallucinations.  A: PRN vistaril 25 mg given for the anxiety, encouraged and supported patient. Encouraged patient to discuss his concern with the physician and his case manager in the morning. R: Patient receptive to encouragement. Q 15 minute check continues as ordered for safety.

## 2013-01-20 NOTE — Progress Notes (Signed)
Patient ID: Nathaniel Warren, male   DOB: [birth year], 47 y.o.   MRN: [id number] [facility] MD Progress Note  01/19/2013 9:31 AM Nathaniel Warren  MRN:  [id number] Subjective:  Met with the patient 1:1 to discuss his progress and response to treatment. He states he is "struggling" today. He reports his sleep is poor, he has racing thoughts at night and can not turn off his mind.  He states he is still suicidal, does have a plan and intent, and feels that he has let his family down. He states he would go back to the bridge he was found on and jump. He can contract for safety on the unit.  He states this is his first psychiatric admission. Diagnosis:   Axis I: Alcohol Abuse and Depressive Disorder NOS Axis II: Deferred Axis III:  Past Medical History  Diagnosis Date  . Stroke   Axis IV: economic problems, housing problems, occupational problems, other psychosocial or environmental problems, problems related to social environment and problems with primary support group Axis V: 41-50 serious symptoms  ADL's:  Intact  Sleep: poor  Appetite:  Good  Suicidal Ideation:  Plan:  hang himself or walk into traffic Intent:  None Means:  None Homicidal Ideation:  Denies  Psychiatric Specialty Exam: Review of Systems  Constitutional: Negative.   HENT: Negative.   Eyes: Negative.   Respiratory: Negative.   Cardiovascular: Negative.   Gastrointestinal: Negative.   Genitourinary: Negative.   Musculoskeletal: Negative.   Skin: Negative.   Neurological: Negative.   Endo/Heme/Allergies: Negative.   Psychiatric/Behavioral: Positive for depression and suicidal ideas. The patient is nervous/anxious.     Blood pressure 116/82, pulse 84, temperature 97 F (36.1 C), temperature source Oral, resp. rate 20, height 5\' 9"  (1.753 m), weight 68.04 kg (150 lb).Body mass index is 22.14 kg/(m^2).  General Appearance: Casual  Eye Contact::  Fair  Speech:  Normal Rate  Volume:  Normal  Mood:  Anxious and Depressed   Affect:  Non-Congruent  Thought Process:  Coherent  Orientation:  Full (Time, Place, and Person)  Thought Content:  WDL  Suicidal Thoughts:  Yes, with intent and plan, but can contract for safety  Homicidal Thoughts:  No  Memory:  Immediate;   Fair Recent;   Fair Remote;   Fair  Judgement:  Fair  Insight:  Fair  Psychomotor Activity:  Normal  Concentration:  Fair  Recall:  Fair  Akathisia:  No  Handed:  Right  AIMS (if indicated):     Assets:  Communication Skills Resilience Social Support  Sleep:  Number of Hours: 5.5   Current Medications: Current Facility-Administered Medications  Medication Dose Route Frequency Provider Last Rate Last Dose  . alum & mag hydroxide-simeth (MAALOX/MYLANTA) 200-200-20 MG/5ML suspension 30 mL  30 mL Oral Q4H PRN Larena Sox, MD      . busPIRone (BUSPAR) tablet 10 mg  10 mg Oral TID Nanine Means, NP   10 mg at 01/19/13 0752  . clonazePAM (KLONOPIN) tablet 1 mg  1 mg Oral BID PRN Nanine Means, NP   1 mg at 01/18/13 2132  . gabapentin (NEURONTIN) capsule 300 mg  300 mg Oral QID Verne Spurr, PA-C   300 mg at 01/19/13 0751  . hydrochlorothiazide (HYDRODIURIL) tablet 25 mg  25 mg Oral Daily Nanine Means, NP   25 mg at 01/19/13 0752  . hydrOXYzine (ATARAX/VISTARIL) tablet 25 mg  25 mg Oral QID PRN Nanine Means, NP   25 mg at 01/19/13 0640  .  ibuprofen (ADVIL,MOTRIN) tablet 800 mg  800 mg Oral Q6H PRN Nanine Means, NP   800 mg at 01/19/13 0640  . magnesium hydroxide (MILK OF MAGNESIA) suspension 30 mL  30 mL Oral Daily PRN Larena Sox, MD      . multivitamin with minerals tablet 1 tablet  1 tablet Oral Daily Lavena Bullion, RD   1 tablet at 01/19/13 0752  . nicotine (NICODERM CQ - dosed in mg/24 hours) patch 21 mg  21 mg Transdermal Daily Himabindu Ravi, MD   21 mg at 01/19/13 0644  . pantoprazole (PROTONIX) EC tablet 40 mg  40 mg Oral BID AC Nanine Means, NP   40 mg at 01/19/13 0641  . potassium chloride (K-DUR,KLOR-CON) CR tablet 10 mEq   10 mEq Oral Daily Nanine Means, NP   10 mEq at 01/19/13 0752  . sertraline (ZOLOFT) tablet 75 mg  75 mg Oral Daily Himabindu Ravi, MD   75 mg at 01/19/13 0753  . traZODone (DESYREL) tablet 100 mg  100 mg Oral QHS,MR X 1 Kerry Hough, PA   100 mg at 01/18/13 2231    Lab Results: No results found for this or any previous visit (from the past 48 hour(s)).  Physical Findings: AIMS: Facial and Oral Movements Muscles of Facial Expression: None, normal Lips and Perioral Area: None, normal Jaw: None, normal Tongue: None, normal,Extremity Movements Upper (arms, wrists, hands, fingers): None, normal Lower (legs, knees, ankles, toes): None, normal, Trunk Movements Neck, shoulders, hips: None, normal, Overall Severity Severity of abnormal movements (highest score from questions above): None, normal Incapacitation due to abnormal movements: None, normal Patient's awareness of abnormal movements (rate only patient's report): No Awareness, Dental Status Current problems with teeth and/or dentures?: No Does patient usually wear dentures?: No  CIWA:  CIWA-Ar Total: 0 COWS:  COWS Total Score: 0  Treatment Plan Summary: Daily contact with patient to assess and evaluate symptoms and progress in treatment Medication management  Plan:  Review of chart, vital signs, medications, and notes.  LOS:  1-3 days 1-Individual and group therapy 2-Medication management for depression and anxiety:  Medications reviewed with the patient and he stated no adverse side effects 3-Coping skills for depression and anxiety 4-Continue crisis stabilization and management 5-Address health issues--stable 6-Treatment plan in progress to prevent relapse of depression and anxiety 7. Added Seroquel 100mg  po at hs for racing thoughts and sleep. 8. D/C'd trazodone as patient reported it was not working. Medical Decision Making Problem Points:  Established problem, stable/improving (1) and Review of psycho-social stressors  (1) Data Points:  Review of medication regiment & side effects (2) Time spent with this patient was 30-40 minutes with the majority of this time in counseling and education. I certify that inpatient services furnished can reasonably be expected to improve the patient's condition.   Rona Ravens. Zaeda Mcferran [facility] 01/20/2013 6:35 PM

## 2013-01-20 NOTE — Progress Notes (Signed)
D: Patient cooperative with staff and peers. Patient's affect/mood is flat and anxious. Patient complained of anxiety 9/10. He reported on his self inventory sheet that his appetite is improving, energy level is low and ability to pay attention is poor. Patient rated depression "8" and feelings of hopelessness "7". Patient is often observed interacting in the milieu with peers, laughing and having multiple conversations.   A: Support and encouragement provided to patient. Administered scheduled medications per ordering MD. Monitor Q15 minute checks for safety.  R: Patient receptive. Passive SI, but contracts for safety. Denies HI/AVH. Patient remains safe.

## 2013-01-20 NOTE — Progress Notes (Signed)
Reviewed

## 2013-01-20 NOTE — Progress Notes (Signed)
Adult Psychoeducational Group Note  Date:  01/20/2013 Time:  2:23 AM  Group Topic/Focus:  Wrap-Up Group:   The focus of this group is to help patients review their daily goal of treatment and discuss progress on daily workbooks.  Participation Level:  Active  Participation Quality:  Appropriate, Attentive and Sharing  Affect:  Appropriate  Cognitive:  Alert and Appropriate  Insight: Appropriate  Engagement in Group:  Engaged  Modes of Intervention:  Discussion  Additional Comments:  Pt told one good thing about the day; pt stated he feels his meds are almost where they need to be and they are helping him feel better.   Dalia Heading 01/20/2013, 2:23 AM

## 2013-01-20 NOTE — Progress Notes (Signed)
[facility] Group Notes:  (Nursing/MHT/Case Management/Adjunct)  Date:  01/20/2013  Time:  3:53 PM  Type of Therapy:  Psychoeducational Skills  Participation Level:  Active  Participation Quality:  Inattentive, Redirectable and Sharing  Affect:  Appropriate  Cognitive:  Alert, Appropriate and Oriented  Insight:  Limited  Engagement in Group:  Distracting  Modes of Intervention:  Activity, Discussion, Education, Limit-setting, Problem-solving, Rapport Building, Socialization and Support  Summary of Progress/Problems: Nathaniel Warren participated in activity where group members are asked to identify two photos, one that represents what their life looks like in balance and one that represents what their life looks like out of balance. Nathaniel Warren was engaged in flirtatious side talk and asked to make personal space with male peer while group discussed what factors contribute to being out of balance and behaviors or though processes assist in creating balance in their lives. Nathaniel Warren identified taking on family responisbilities and not sharing troubles/emotions or difficulties with his wife or other family/supports when dealing with finances as a barrier to balance and opening up to them as a goal towards creating balance in his life.    Wandra Scot 01/20/2013, 3:53 PM

## 2013-01-20 NOTE — Progress Notes (Signed)
MHT reported that pt was found hiding in a male peers bathroom at 15 minute check.  Pt was spoken to about inappropriate behaviors and reminded of the treatment agreement signed which states that he would not enter other patient's rooms.  Pt was apologetic, making excuses about why he was in the room.  Will continue to monitor with q15 minute checks.

## 2013-01-20 NOTE — Progress Notes (Signed)
Pt reports he is doing ok tonight.  He has been observed on the hall interacting with peers, playing cards.  There seems to be a clique on the hall with this pt as the leader. He is flirtacious with his male peers.  In talking with staff, he says he has passive SI which comes and goes, but can contract for safety.  He denies HI/AV.  He tells this Clinical research associate that he has conflict at home and does not want to go home.  Support and encouragement given.  Pt is at the med window regularly to ask for prns.  Pt makes his needs known to staff.  Safety maintained with q15 minute checks.

## 2013-01-21 ENCOUNTER — Emergency Department (HOSPITAL_COMMUNITY)
Admission: EM | Admit: 2013-01-21 | Discharge: 2013-01-22 | Disposition: A | Discharge status: Home / Self Care | Payer: Self-pay | Attending: Emergency Medicine | Admitting: Emergency Medicine

## 2013-01-21 ENCOUNTER — Encounter (HOSPITAL_COMMUNITY): Payer: Self-pay | Admitting: *Deleted

## 2013-01-21 DIAGNOSIS — Z79899 Other long term (current) drug therapy: Secondary | ICD-10-CM | POA: Insufficient documentation

## 2013-01-21 DIAGNOSIS — F411 Generalized anxiety disorder: Secondary | ICD-10-CM | POA: Insufficient documentation

## 2013-01-21 DIAGNOSIS — S335XXA Sprain of ligaments of lumbar spine, initial encounter: Secondary | ICD-10-CM | POA: Insufficient documentation

## 2013-01-21 DIAGNOSIS — F329 Major depressive disorder, single episode, unspecified: Secondary | ICD-10-CM

## 2013-01-21 DIAGNOSIS — S060X9A Concussion with loss of consciousness of unspecified duration, initial encounter: Secondary | ICD-10-CM | POA: Insufficient documentation

## 2013-01-21 DIAGNOSIS — S6990XA Unspecified injury of unspecified wrist, hand and finger(s), initial encounter: Secondary | ICD-10-CM | POA: Insufficient documentation

## 2013-01-21 DIAGNOSIS — S59909A Unspecified injury of unspecified elbow, initial encounter: Secondary | ICD-10-CM | POA: Insufficient documentation

## 2013-01-21 DIAGNOSIS — Z9889 Other specified postprocedural states: Secondary | ICD-10-CM | POA: Insufficient documentation

## 2013-01-21 DIAGNOSIS — S139XXA Sprain of joints and ligaments of unspecified parts of neck, initial encounter: Secondary | ICD-10-CM | POA: Insufficient documentation

## 2013-01-21 DIAGNOSIS — F17200 Nicotine dependence, unspecified, uncomplicated: Secondary | ICD-10-CM | POA: Insufficient documentation

## 2013-01-21 DIAGNOSIS — R110 Nausea: Secondary | ICD-10-CM | POA: Insufficient documentation

## 2013-01-21 DIAGNOSIS — Z8673 Personal history of transient ischemic attack (TIA), and cerebral infarction without residual deficits: Secondary | ICD-10-CM | POA: Insufficient documentation

## 2013-01-21 MED ORDER — CLONAZEPAM 1 MG PO TABS: 1.0000 mg | ORAL_TABLET | Freq: Two times a day (BID) | ORAL | Status: DC | PRN

## 2013-01-21 MED ORDER — HYDROXYZINE HCL 25 MG PO TABS: 25.0000 mg | ORAL_TABLET | Freq: Four times a day (QID) | ORAL | Status: DC | PRN

## 2013-01-21 MED ORDER — SERTRALINE HCL 100 MG PO TABS: 100.0000 mg | ORAL_TABLET | Freq: Every day | ORAL | Status: DC

## 2013-01-21 MED ORDER — QUETIAPINE FUMARATE 100 MG PO TABS: 100.0000 mg | ORAL_TABLET | Freq: Every day | ORAL | Status: DC

## 2013-01-21 MED ORDER — BUSPIRONE HCL 10 MG PO TABS: 10.0000 mg | ORAL_TABLET | Freq: Three times a day (TID) | ORAL | Status: DC

## 2013-01-21 MED ORDER — GABAPENTIN 300 MG PO CAPS: 300.0000 mg | ORAL_CAPSULE | Freq: Four times a day (QID) | ORAL | Status: DC

## 2013-01-21 NOTE — Discharge Summary (Signed)
Physician Discharge Summary Note  Patient:  Nathaniel Warren is an 47 y.o., male MRN:  [id number] DOB:  1965/12/19 Patient phone:  [phone] (home)  Patient address:   [address] [place] [address],   Date of Admission:  01/09/2013 Date of Discharge: 01/21/2013  Reason for Admission:  Patient had been very depressed for past 2 to 3 months. 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 Had a stroke afterwards, reports losing everything including their house and kids. States it was devastating and he was suicidal. Tried to drink himself to death 2 days ago by drinking a fifth of liquor. Denies drinking regularly. Had thoughts of jumping off a bridge  Discharge Diagnoses: Principal Problem:   Depression Active Problems:   Anxiety  Review of Systems  Constitutional: Negative.   Eyes: Negative.   Respiratory: Negative.   Cardiovascular: Negative.   Gastrointestinal: Negative.   Genitourinary: Negative.   Musculoskeletal: Negative.   Skin: Negative.   Neurological: Negative.   Endo/Heme/Allergies: Negative.   Psychiatric/Behavioral: Positive for depression. The patient is nervous/anxious.    Axis Diagnosis:   AXIS I:  Anxiety Disorder NOS and Depressive Disorder NOS AXIS II:  Deferred AXIS III:   Past Medical History  Diagnosis Date  . Stroke    AXIS IV:  economic problems, occupational problems, other psychosocial or environmental problems, problems related to social environment and problems with primary support group AXIS V:  61-70 mild symptoms  Level of Care:  OP  Hospital Course:  Patient attended and participated in groups; in fact, yesterday he ran his own group independently.  He is no longer suicidal or homicidal, no auditory or visual hallucinations.  Nathaniel Warren is scheduled to go to live in the shelter with his wife identified as his support person based on the social worker's knowledge.  Medications were adjusted per the psychiatrist for his  anxiety and depression; stable.  Rx and 14 day supply of medications given to the patient at discharge.  Consults:  None  Significant Diagnostic Studies:  labs: Completed and reviewed, stable  Discharge Vitals:   Blood pressure 114/76, pulse 89, temperature 97.1 F (36.2 C), temperature source Oral, resp. rate 20, height 5\' 9"  (1.753 m), weight 68.04 kg (150 lb). Body mass index is 22.14 kg/(m^2). Lab Results:   No results found for this or any previous visit (from the past 72 hour(s)).  Physical Findings: AIMS: Facial and Oral Movements Muscles of Facial Expression: None, normal Lips and Perioral Area: None, normal Jaw: None, normal Tongue: None, normal,Extremity Movements Upper (arms, wrists, hands, fingers): None, normal Lower (legs, knees, ankles, toes): None, normal, Trunk Movements Neck, shoulders, hips: None, normal, Overall Severity Severity of abnormal movements (highest score from questions above): None, normal Incapacitation due to abnormal movements: None, normal Patient's awareness of abnormal movements (rate only patient's report): No Awareness, Dental Status Current problems with teeth and/or dentures?: No Does patient usually wear dentures?: No  CIWA:  CIWA-Ar Total: 0 COWS:  COWS Total Score: 0  Psychiatric Specialty Exam: See Psychiatric Specialty Exam and Suicide Risk Assessment completed by Attending Physician prior to discharge.  Discharge destination:  Home  Is patient on multiple antipsychotic therapies at discharge:  No   Has Patient had three or more failed trials of antipsychotic monotherapy by history:  No Recommended Plan for Multiple Antipsychotic Therapies:  N/A  Discharge Orders   Future Orders Complete By Expires     Diet - low sodium heart healthy  As directed     Increase activity slowly  As directed         Medication List    TAKE these medications     Indication   busPIRone 10 MG tablet  Commonly known as:  BUSPAR  Take 1 tablet  (10 mg total) by mouth 3 (three) times daily. For anxiety   Indication:  Anxiety Disorder     clonazePAM 1 MG tablet  Commonly known as:  KLONOPIN  Take 1 tablet (1 mg total) by mouth 2 (two) times daily as needed (anxiety).   Indication:  anxiety     gabapentin 300 MG capsule  Commonly known as:  NEURONTIN  Take 1 capsule (300 mg total) by mouth 4 (four) times daily. For anxiety      hydrOXYzine 25 MG tablet  Commonly known as:  ATARAX/VISTARIL  Take 1 tablet (25 mg total) by mouth 4 (four) times daily as needed for anxiety.   Indication:  anxiety     ibuprofen 200 MG tablet  Commonly known as:  ADVIL,MOTRIN  Take 800 mg by mouth every 6 (six) hours as needed. For pain      QUEtiapine 100 MG tablet  Commonly known as:  SEROQUEL  Take 1 tablet (100 mg total) by mouth at bedtime. For thoughts/mood   Indication:  Trouble Sleeping, thoughts/mood     sertraline 100 MG tablet  Commonly known as:  ZOLOFT  Take 1 tablet (100 mg total) by mouth daily. For depression/anxiety   Indication:  depression/anxiety           Follow-up Information   Follow up with [facility]. (Go to the walk-in clinic M-F between 8 and 9 for your hospital follow up appointment)    Contact information:   [address]  [place]  [phone]      Follow up with [facility] the [place]. (Go to the walk in clinic at [facility] or 1PM Monday thru Friday to be assessed for services.  they have both groups and individual therapists)    Contact information:   [address]  [place]  [336] [phone]      Follow-up recommendations:  Activity:  As tolerated Diet:  Low-sodium heart healthy diet  Comments:  Patient will follow-up with [facility] [place]  Total Discharge Time:  Greater than 30 minutes.  SignedNanine Warren, PMH-NP 01/21/2013, 4:05 PM

## 2013-01-21 NOTE — Progress Notes (Signed)
Patient ID: Nathaniel Warren, male   DOB: 08/06/1966, 47 y.o.   MRN: [id number] Patient discharged per physician order;  patient received samples, prescriptions, and copy of AVS, and both bus passes and instructions after it was reviewed; patient had no other questions at this time; patient verbalized and signed that he received all his belongings; patient left the unit ambulatory

## 2013-01-21 NOTE — Progress Notes (Signed)
Agree with assessment and plan Fitzhugh Vizcarrondo A. Kenise Barraco, M.D. 
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
## 2013-01-21 NOTE — Progress Notes (Signed)
[facility] LCSW Group Therapy       Feelings Around Relapse 1:15 - 2:30 PM          01/21/2013 3:41 PM  Type of Therapy:  Group Therapy  Participation Level:  Active  Participation Quality:  Appropriate  Affect:  Appropriate  Cognitive:  Alert and Appropriate  Insight:  Engaged  Engagement in Therapy:  Engaged  Modes of Intervention:  Discussion, Exploration, Problem-solving, Rapport Building and Support  Summary of Progress/Problems:  Patient shared relapse would be returning to his old way of thinking.  Patient stated he has learned the importance of communicating with those around him.  Wynn Banker 01/21/2013, 3:41 PM

## 2013-01-21 NOTE — [facility] Suicide Risk Assessment (Signed)
Suicide Risk Assessment  Discharge Assessment     Demographic Factors:  Male, Caucasian, Low socioeconomic status and Unemployed  Mental Status Per Nursing Assessment::   On Admission:  Suicidal ideation indicated by patient;Suicide plan;Self-harm thoughts;Self-harm behaviors;Belief that plan would result in death  Current Mental Status by Physician: Alert and oriented to 4. Denies aH/VH/SI/HI.  Loss Factors: Decrease in vocational status and Financial problems/change in socioeconomic status  Historical Factors: NA  Risk Reduction Factors:   Positive social support and Positive coping skills or problem solving skills  Continued Clinical Symptoms:  Depression:   Recent sense of peace/wellbeing  Cognitive Features That Contribute To Risk:  Cognitively intact   Suicide Risk:  Minimal: No identifiable suicidal ideation.  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 Stroke    AXIS IV:  economic problems, housing problems, occupational problems and other psychosocial or environmental problems AXIS V:  61-70 mild symptoms  Plan Of Care/Follow-up recommendations:  Activity:  as tolerated Diet:  Low salt diet Follow up with outpatient appointments.  Is patient on multiple antipsychotic therapies at discharge:  No   Has Patient had three or more failed trials of antipsychotic monotherapy by history:  No  Recommended Plan for Multiple Antipsychotic Therapies: NA  Nathaniel Warren 01/21/2013, 9:56 AM

## 2013-01-21 NOTE — Progress Notes (Signed)
Agree with assessment and plan Kathan Kirker A. Rondy Krupinski, M.D. 

## 2013-01-21 NOTE — Progress Notes (Signed)
[facility] INPATIENT:  Family/Significant Other Suicide Prevention Education  Suicide Prevention Education:  Contact Attempts:Nathaniel Warren, wife, [phone], has been identified by the patient as the family member/significant other with whom the patient will be residing, and identified as the person(s) who will aid the patient in the event of a mental health crisis.  With written consent from the patient, two attempts were made to provide suicide prevention education, prior to and/or following the patient's discharge.  We were unsuccessful in providing suicide prevention education.  A suicide education pamphlet was given to the patient to share with family/significant other.  Date and time of first attempt: 01/20/13  4:30 PM Date and time of second attempt: 01/21/13  8 ;45 AM  Nathaniel Warren 01/21/2013, 10:27 AM

## 2013-01-21 NOTE — ED Notes (Signed)
[id number]<GN> Expected date:<BR> Expected time:<BR> Means of arrival:<BR> Comments:<BR>

## 2013-01-21 NOTE — Progress Notes (Signed)
Recreation Therapy Notes  01/21/2013          Time: 3:00pm   Group Topic/Focus: Goal Setting   Participation Level:  Did not attend     Jearl Klinefelter, LRT/CTRS   Jearl Klinefelter 01/21/2013 4:15 PM

## 2013-01-21 NOTE — Progress Notes (Signed)
[facility] Adult Case Management Discharge Plan :  Will you be returning to the same living situation after discharge: No. At discharge, do you have transportation home?:Yes,  bus pass Do you have the ability to pay for your medications:Yes,  mental health  Release of information consent forms completed and in the chart;  Patient's signature needed at discharge.  Patient to Follow up at: Follow-up Information   Follow up with [facility]. (Go to the walk-in clinic M-F between 8 and 9 for your hospital follow up appointment)    Contact information:   [address]  [place]  [phone]      Follow up with [facility] the [place]. (Go to the walk in clinic at [facility] or 1PM Monday thru Friday to be assessed for services.  they have both groups and individual therapists)    Contact information:   [address]  [place]  [336] [phone]      Patient denies SI/HI:   Yes,  yes    Safety Planning and Suicide Prevention discussed:  Yes,  yes  [name] 01/21/2013, 10:30 AM

## 2013-01-22 ENCOUNTER — Emergency Department (HOSPITAL_COMMUNITY)
Admission: EM | Admit: 2013-01-22 | Discharge: 2013-01-22 | Disposition: A | Discharge status: Home / Self Care | Payer: Self-pay | Attending: Emergency Medicine | Admitting: Emergency Medicine

## 2013-01-22 ENCOUNTER — Emergency Department (HOSPITAL_COMMUNITY): Payer: Self-pay

## 2013-01-22 ENCOUNTER — Emergency Department (HOSPITAL_COMMUNITY)
Admission: EM | Admit: 2013-01-22 | Discharge: 2013-01-24 | Disposition: A | Discharge status: Home / Self Care | Payer: Self-pay | Attending: Emergency Medicine | Admitting: Emergency Medicine

## 2013-01-22 ENCOUNTER — Encounter (HOSPITAL_COMMUNITY): Payer: Self-pay | Admitting: Cardiology

## 2013-01-22 ENCOUNTER — Ambulatory Visit (HOSPITAL_COMMUNITY)
Admission: RE | Admit: 2013-01-22 | Discharge: 2013-01-22 | Disposition: A | Discharge status: Home / Self Care | Payer: No Typology Code available for payment source | Attending: Psychiatry | Admitting: Psychiatry

## 2013-01-22 ENCOUNTER — Encounter (HOSPITAL_COMMUNITY): Payer: Self-pay | Admitting: Emergency Medicine

## 2013-01-22 DIAGNOSIS — F3289 Other specified depressive episodes: Secondary | ICD-10-CM | POA: Insufficient documentation

## 2013-01-22 DIAGNOSIS — F411 Generalized anxiety disorder: Secondary | ICD-10-CM | POA: Insufficient documentation

## 2013-01-22 DIAGNOSIS — Z8669 Personal history of other diseases of the nervous system and sense organs: Secondary | ICD-10-CM | POA: Insufficient documentation

## 2013-01-22 DIAGNOSIS — F17200 Nicotine dependence, unspecified, uncomplicated: Secondary | ICD-10-CM | POA: Insufficient documentation

## 2013-01-22 DIAGNOSIS — F329 Major depressive disorder, single episode, unspecified: Secondary | ICD-10-CM | POA: Insufficient documentation

## 2013-01-22 DIAGNOSIS — F419 Anxiety disorder, unspecified: Secondary | ICD-10-CM

## 2013-01-22 DIAGNOSIS — Z8673 Personal history of transient ischemic attack (TIA), and cerebral infarction without residual deficits: Secondary | ICD-10-CM | POA: Insufficient documentation

## 2013-01-22 DIAGNOSIS — IMO0002 Reserved for concepts with insufficient information to code with codable children: Secondary | ICD-10-CM | POA: Insufficient documentation

## 2013-01-22 DIAGNOSIS — G40909 Epilepsy, unspecified, not intractable, without status epilepticus: Secondary | ICD-10-CM | POA: Insufficient documentation

## 2013-01-22 DIAGNOSIS — Z79899 Other long term (current) drug therapy: Secondary | ICD-10-CM | POA: Insufficient documentation

## 2013-01-22 DIAGNOSIS — F319 Bipolar disorder, unspecified: Secondary | ICD-10-CM | POA: Insufficient documentation

## 2013-01-22 DIAGNOSIS — R45851 Suicidal ideations: Secondary | ICD-10-CM | POA: Insufficient documentation

## 2013-01-22 HISTORY — DX: Depression, unspecified: F32.A

## 2013-01-22 HISTORY — DX: Unspecified convulsions: R56.9

## 2013-01-22 HISTORY — DX: Major depressive disorder, single episode, unspecified: F32.9

## 2013-01-22 HISTORY — DX: Bipolar disorder, unspecified: F31.9

## 2013-01-22 LAB — RAPID URINE DRUG SCREEN, HOSP PERFORMED
Opiates: NOT DETECTED
Tetrahydrocannabinol: NOT DETECTED

## 2013-01-22 LAB — CBC WITH DIFFERENTIAL/PLATELET
Basophils Relative: 0 % (ref 0–1)
Eosinophils Relative: 2 % (ref 0–5)
HCT: 45.1 % (ref 39.0–52.0)
Lymphs Abs: 2.7 10*3/uL (ref 0.7–4.0)
MCV: 97.4 fL (ref 78.0–100.0)
Monocytes Relative: 10 % (ref 3–12)
Neutro Abs: 10 10*3/uL — ABNORMAL HIGH (ref 1.7–7.7)
RBC: 4.63 MIL/uL (ref 4.22–5.81)
WBC: 14.4 10*3/uL — ABNORMAL HIGH (ref 4.0–10.5)

## 2013-01-22 LAB — COMPREHENSIVE METABOLIC PANEL
AST: 53 U/L — ABNORMAL HIGH (ref 0–37)
BUN: 14 mg/dL (ref 6–23)
CO2: 26 mEq/L (ref 19–32)
Chloride: 97 mEq/L (ref 96–112)
Creatinine, Ser: 0.96 mg/dL (ref 0.50–1.35)
GFR calc non Af Amer: >90 mL/min (ref >90)
Glucose, Bld: 94 mg/dL (ref 70–99)
Total Bilirubin: 0.2 mg/dL — ABNORMAL LOW (ref 0.3–1.2)

## 2013-01-22 LAB — URINALYSIS, ROUTINE W REFLEX MICROSCOPIC
Bilirubin Urine: NEGATIVE
Hgb urine dipstick: NEGATIVE
Ketones, ur: NEGATIVE mg/dL
Protein, ur: NEGATIVE mg/dL
Urobilinogen, UA: 0.2 mg/dL (ref 0.0–1.0)

## 2013-01-22 LAB — ETHANOL: Alcohol, Ethyl (B): <11 mg/dL (ref 0–11)

## 2013-01-22 MED ORDER — CYCLOBENZAPRINE HCL 10 MG PO TABS: 10.0000 mg | ORAL_TABLET | Freq: Two times a day (BID) | ORAL | Status: DC | PRN

## 2013-01-22 MED ORDER — OXYCODONE-ACETAMINOPHEN 5-325 MG PO TABS
2.0000 | ORAL_TABLET | Freq: Once | ORAL | Status: AC
  Administered 2013-01-22: 2 via ORAL
  Filled 2013-01-22: qty 2

## 2013-01-22 MED ORDER — ACETAMINOPHEN 325 MG PO TABS
650.0000 mg | ORAL_TABLET | ORAL | Status: DC | PRN
  Administered 2013-01-24: 650 mg via ORAL
  Filled 2013-01-22: qty 2

## 2013-01-22 MED ORDER — ACETAMINOPHEN 325 MG PO TABS
650.0000 mg | ORAL_TABLET | Freq: Once | ORAL | Status: AC
  Administered 2013-01-22: 650 mg via ORAL
  Filled 2013-01-22: qty 2

## 2013-01-22 MED ORDER — LORAZEPAM 1 MG PO TABS
1.0000 mg | ORAL_TABLET | Freq: Once | ORAL | Status: AC
  Administered 2013-01-22: 1 mg via ORAL
  Filled 2013-01-22: qty 1

## 2013-01-22 MED ORDER — OXYCODONE-ACETAMINOPHEN 5-325 MG PO TABS: 1.0000 | ORAL_TABLET | Freq: Four times a day (QID) | ORAL | Status: DC | PRN

## 2013-01-22 MED ORDER — IBUPROFEN 200 MG PO TABS
600.0000 mg | ORAL_TABLET | Freq: Once | ORAL | Status: AC
  Administered 2013-01-22: 600 mg via ORAL
  Filled 2013-01-22: qty 3
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
## 2013-01-22 NOTE — ED Notes (Signed)
Pt maintains that he is suicidal. EDP informed and continued DC disposition. Pt states continued SI since recent [facility] admission and denies that he ever stopped being suicidal. Pt did not discuss his SI during earlier visit to ED for pain.
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
## 2013-01-22 NOTE — ED Provider Notes (Signed)
Medical screening examination/treatment/procedure(s) were performed by non-physician practitioner and as supervising physician I was immediately available for consultation/collaboration.   Lyanne Co, MD 01/22/13 [phone]
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
## 2013-01-22 NOTE — ED Notes (Signed)
Pt to department stating he has been depressed for the past several months. States he was just released from [facility] about 2 hours ago. States he wants to jump off a bridge or hang himself.
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
## 2013-01-23 NOTE — ED Notes (Signed)
Gave Sitter a lunch break 
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
## 2013-01-23 NOTE — ED Provider Notes (Signed)
0830.  Pt sleep in bed, NAD.  Note stable VS.  No acute overnight events reported.  Pt has been recently seen and discharged at [facility], and now is here for evaluation reporting SI.  There has been some concern that he may be malingering or seeking a secondary gain in these presentations.  Will place a consult to telepsych for evaluation.  Will review the recommendations as available and provide an appropriate disposition.  Tobin Chad, MD 01/23/13 [phone]

## 2013-01-24 MED ORDER — BUSPIRONE HCL 10 MG PO TABS: 10.0000 mg | ORAL_TABLET | Freq: Three times a day (TID) | ORAL | Status: DC

## 2013-01-24 MED ORDER — HYDROXYZINE HCL 25 MG PO TABS: 25.0000 mg | ORAL_TABLET | Freq: Two times a day (BID) | ORAL | Status: DC | PRN

## 2013-01-24 MED ORDER — QUETIAPINE FUMARATE 25 MG PO TABS: 75.0000 mg | ORAL_TABLET | Freq: Every day | ORAL | Status: DC

## 2013-01-24 MED ORDER — GABAPENTIN 300 MG PO CAPS: 300.0000 mg | ORAL_CAPSULE | Freq: Two times a day (BID) | ORAL | Status: DC | PRN

## 2013-01-24 MED ORDER — SERTRALINE HCL 25 MG PO TABS: 75.0000 mg | ORAL_TABLET | Freq: Every day | ORAL | Status: DC

## 2013-01-24 MED ORDER — CLONAZEPAM 1 MG PO TABS: 1.0000 mg | ORAL_TABLET | Freq: Two times a day (BID) | ORAL | Status: DC | PRN
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
## 2013-01-24 NOTE — BH Assessment (Signed)
Assessment Note  Update:  Pt received telepsych today recommending pt be discharged to follow up with outpatient treatment.  Pt denies SI/HI or psychosis currently.  Pt signed a No Harm contract and stated he will follow up with [facility] today.  EDP You in agreement with disposition.  Telepsych psychiatrist made medication recommendations for pt and EDP Yao to write these prescriptions for pt.  Updated ED staff.  Updated assessment disposition, completed assessment notification, and faxed to [facility] to log.  Pt to be discharged.    Disposition:  Disposition Disposition of Patient: Referred to;Outpatient treatment Type of inpatient treatment program: Adult Type of outpatient treatment: Adult Other disposition(s):  ([facility] to [facility] Patient referred to: Outpatient clinic referral (Pt following up with [facility] today)  On Site Evaluation by:   Reviewed with Physician:  Tacy Learn, Rennis Harding 01/24/2013 11:03 AM

## 2013-01-24 NOTE — ED Notes (Signed)
Of ongoing headache-- requesting ibuprofen or tylenol

## 2013-01-25 NOTE — Discharge Summary (Signed)
Reviewed

## 2013-01-26 NOTE — Progress Notes (Signed)
Patient Discharge Instructions:  After Visit Summary (AVS):   Faxed to:  01/26/13 Discharge Summary Note:   Faxed to:  01/26/13 Psychiatric Admission Assessment Note:   Faxed to:  01/26/13 Suicide Risk Assessment - Discharge Assessment:   Faxed to:  01/26/13 Faxed/Sent to the Next [facility] Care provider:  01/26/13 Faxed to [facility] the [place] @ [phone] Faxed to [facility] @ [phone]  Jerelene Redden, 01/26/2013, 3:16 PM

## 2013-03-23 ENCOUNTER — Emergency Department (HOSPITAL_COMMUNITY)
Admission: EM | Admit: 2013-03-23 | Discharge: 2013-03-23 | Discharge status: Against Medical Advice | Payer: Self-pay | Attending: Emergency Medicine | Admitting: Emergency Medicine

## 2013-03-23 ENCOUNTER — Ambulatory Visit (HOSPITAL_COMMUNITY): Admission: RE | Admit: 2013-03-23 | Payer: Self-pay | Source: Ambulatory Visit

## 2013-03-23 DIAGNOSIS — M549 Dorsalgia, unspecified: Secondary | ICD-10-CM

## 2013-03-23 DIAGNOSIS — Z8673 Personal history of transient ischemic attack (TIA), and cerebral infarction without residual deficits: Secondary | ICD-10-CM | POA: Insufficient documentation

## 2013-03-23 DIAGNOSIS — D72829 Elevated white blood cell count, unspecified: Secondary | ICD-10-CM | POA: Insufficient documentation

## 2013-03-23 DIAGNOSIS — Z8669 Personal history of other diseases of the nervous system and sense organs: Secondary | ICD-10-CM | POA: Insufficient documentation

## 2013-03-23 DIAGNOSIS — F10229 Alcohol dependence with intoxication, unspecified: Secondary | ICD-10-CM | POA: Insufficient documentation

## 2013-03-23 DIAGNOSIS — F17200 Nicotine dependence, unspecified, uncomplicated: Secondary | ICD-10-CM | POA: Insufficient documentation

## 2013-03-23 DIAGNOSIS — Y9301 Activity, walking, marching and hiking: Secondary | ICD-10-CM | POA: Insufficient documentation

## 2013-03-23 DIAGNOSIS — IMO0002 Reserved for concepts with insufficient information to code with codable children: Secondary | ICD-10-CM | POA: Insufficient documentation

## 2013-03-23 DIAGNOSIS — F1010 Alcohol abuse, uncomplicated: Secondary | ICD-10-CM

## 2013-03-23 DIAGNOSIS — Y92410 Unspecified street and highway as the place of occurrence of the external cause: Secondary | ICD-10-CM | POA: Insufficient documentation

## 2013-03-23 DIAGNOSIS — F319 Bipolar disorder, unspecified: Secondary | ICD-10-CM | POA: Insufficient documentation

## 2013-03-23 DIAGNOSIS — Z79899 Other long term (current) drug therapy: Secondary | ICD-10-CM | POA: Insufficient documentation

## 2013-03-23 LAB — CBC
Platelets: 204 10*3/uL (ref 150–400)
RBC: 5.25 MIL/uL (ref 4.22–5.81)
RDW: 13.8 % (ref 11.5–15.5)
WBC: 20.2 10*3/uL — ABNORMAL HIGH (ref 4.0–10.5)

## 2013-03-23 LAB — SAMPLE TO BLOOD BANK

## 2013-03-23 LAB — POCT I-STAT, CHEM 8
Chloride: 106 mEq/L (ref 96–112)
Glucose, Bld: 90 mg/dL (ref 70–99)
HCT: 54 % — ABNORMAL HIGH (ref 39.0–52.0)
Potassium: 3.8 mEq/L (ref 3.5–5.1)

## 2013-03-23 LAB — PROTIME-INR
INR: 0.96 (ref 0.00–1.49)
Prothrombin Time: 12.7 seconds (ref 11.6–15.2)

## 2013-03-23 LAB — ETHANOL: Alcohol, Ethyl (B): 211 mg/dL — ABNORMAL HIGH (ref 0–11)

## 2013-03-23 LAB — HEPATIC FUNCTION PANEL
Albumin: 4 g/dL (ref 3.5–5.2)
Total Protein: 7.4 g/dL (ref 6.0–8.3)

## 2013-03-23 LAB — APTT: aPTT: 35 seconds (ref 24–37)

## 2013-03-23 NOTE — ED Notes (Signed)
Pt resting quietly at this time.  

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
## 2013-03-23 NOTE — ED Notes (Signed)
Received call from pt's wife requesting to know pt's condition.  Explained to her that I could not give any information due to pt confidentiality.  I gave phone to pt and he talked to her and gave permission for her to receive information.

## 2013-04-08 ENCOUNTER — Encounter (HOSPITAL_COMMUNITY): Payer: Self-pay | Admitting: Cardiology

## 2013-04-08 ENCOUNTER — Emergency Department (HOSPITAL_COMMUNITY)
Admission: EM | Admit: 2013-04-08 | Discharge: 2013-04-09 | Disposition: A | Discharge status: Other Acute Inpatient Hospital | Payer: Self-pay | Attending: Emergency Medicine | Admitting: Emergency Medicine

## 2013-04-08 DIAGNOSIS — F17200 Nicotine dependence, unspecified, uncomplicated: Secondary | ICD-10-CM | POA: Insufficient documentation

## 2013-04-08 DIAGNOSIS — R45851 Suicidal ideations: Secondary | ICD-10-CM | POA: Insufficient documentation

## 2013-04-08 DIAGNOSIS — Z79899 Other long term (current) drug therapy: Secondary | ICD-10-CM | POA: Insufficient documentation

## 2013-04-08 DIAGNOSIS — G40909 Epilepsy, unspecified, not intractable, without status epilepticus: Secondary | ICD-10-CM | POA: Insufficient documentation

## 2013-04-08 DIAGNOSIS — Z8673 Personal history of transient ischemic attack (TIA), and cerebral infarction without residual deficits: Secondary | ICD-10-CM | POA: Insufficient documentation

## 2013-04-08 LAB — COMPREHENSIVE METABOLIC PANEL
Albumin: 4.4 g/dL (ref 3.5–5.2)
BUN: 13 mg/dL (ref 6–23)
Calcium: 9.3 mg/dL (ref 8.4–10.5)
Chloride: 99 mEq/L (ref 96–112)
Creatinine, Ser: 0.84 mg/dL (ref 0.50–1.35)
Total Bilirubin: 0.5 mg/dL (ref 0.3–1.2)

## 2013-04-08 LAB — CBC
HCT: 46.5 % (ref 39.0–52.0)
MCH: 34.2 pg — ABNORMAL HIGH (ref 26.0–34.0)
MCHC: 37 g/dL — ABNORMAL HIGH (ref 30.0–36.0)
MCV: 92.4 fL (ref 78.0–100.0)
RDW: 14.1 % (ref 11.5–15.5)
WBC: 11.6 10*3/uL — ABNORMAL HIGH (ref 4.0–10.5)

## 2013-04-08 LAB — SALICYLATE LEVEL: Salicylate Lvl: <2 mg/dL — ABNORMAL LOW (ref 2.8–20.0)

## 2013-04-08 LAB — ETHANOL: Alcohol, Ethyl (B): 40 mg/dL — ABNORMAL HIGH (ref 0–11)

## 2013-04-08 MED ORDER — ZOLPIDEM TARTRATE 5 MG PO TABS
5.0000 mg | ORAL_TABLET | Freq: Every evening | ORAL | Status: DC | PRN
  Administered 2013-04-08: 5 mg via ORAL
  Filled 2013-04-08: qty 1

## 2013-04-08 MED ORDER — NICOTINE 21 MG/24HR TD PT24
21.0000 mg | MEDICATED_PATCH | Freq: Every day | TRANSDERMAL | Status: DC
  Administered 2013-04-08: 21 mg via TRANSDERMAL
  Filled 2013-04-08: qty 1

## 2013-04-08 MED ORDER — LORAZEPAM 1 MG PO TABS: 1.0000 mg | ORAL_TABLET | Freq: Three times a day (TID) | ORAL | Status: DC | PRN

## 2013-04-08 MED ORDER — QUETIAPINE FUMARATE 25 MG PO TABS
75.0000 mg | ORAL_TABLET | Freq: Every day | ORAL | Status: DC
  Administered 2013-04-08: 75 mg via ORAL
  Filled 2013-04-08: qty 1

## 2013-04-08 MED ORDER — SERTRALINE HCL 50 MG PO TABS
75.0000 mg | ORAL_TABLET | Freq: Every day | ORAL | Status: DC
  Administered 2013-04-08: 75 mg via ORAL
  Filled 2013-04-08: qty 2

## 2013-04-08 MED ORDER — ACETAMINOPHEN 325 MG PO TABS: 650.0000 mg | ORAL_TABLET | ORAL | Status: DC | PRN

## 2013-04-08 MED ORDER — ONDANSETRON HCL 8 MG PO TABS: 4.0000 mg | ORAL_TABLET | Freq: Three times a day (TID) | ORAL | Status: DC | PRN
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
## 2013-04-08 NOTE — ED Notes (Signed)
Pt given paper scrubs to change into. Security and AC called to wand and for a sitter. CN notified of pt status.

## 2013-04-08 NOTE — ED Notes (Signed)
Pt has been wanded by security, clothes and travel bag placed at nurses' station; pt resting, watching tv at this time

## 2013-04-08 NOTE — ED Provider Notes (Signed)
History     CSN: [id number]  Arrival date & time 04/08/13  1659   First MD Initiated Contact with Patient 04/08/13 1715      Chief Complaint  Patient presents with  . Suicidal    (Consider location/radiation/quality/duration/timing/severity/associated sxs/prior treatment) Patient is a 47 y.o. male presenting with mental health disorder. The history is provided by the patient. No language interpreter was used.  Mental Health Problem Presenting symptoms comment:  Here for evaluation and treatment of suicidal thoughts and depression. Associated symptoms: no fever   Associated symptoms comment:  He denies needing assistance with substance abuse. He denies HI or hallucinations. He admits he doesn't take his medications as prescribed.    Past Medical History  Diagnosis Date  . Stroke   . Seizures   . Bipolar 1 disorder   . Depression     Past Surgical History  Procedure Laterality Date  . Hip surgery    . Ankle surgery    . Splenectomy      History reviewed. No pertinent family history.  History  Substance Use Topics  . Smoking status: Current Every Day Smoker -- 1.50 packs/day for 30 years    Types: Cigarettes  . Smokeless tobacco: Not on file  . Alcohol Use: Yes     Comment: after 03/16/2013 may drink one alcohol drink per month      Review of Systems  Constitutional: Negative for fever and chills.  HENT: Negative.   Respiratory: Negative.   Cardiovascular: Negative.   Gastrointestinal: Negative.   Musculoskeletal: Negative.   Skin: Negative.   Neurological: Negative.   Psychiatric/Behavioral: Positive for suicidal ideas and dysphoric mood.    Allergies  Asa  Home Medications   Current Outpatient Rx  Name  Route  Sig  Dispense  Refill  . busPIRone (BUSPAR) 10 MG tablet   Oral   Take 1 tablet (10 mg total) by mouth 3 (three) times daily. For anxiety   90 tablet   0   . clonazePAM (KLONOPIN) 1 MG tablet   Oral   Take 1 tablet (1 mg total) by mouth 2  (two) times daily as needed (anxiety).   20 tablet   0   . cyclobenzaprine (FLEXERIL) 10 MG tablet   Oral   Take 1 tablet (10 mg total) by mouth 2 (two) times daily as needed for muscle spasms.   20 tablet   0   . gabapentin (NEURONTIN) 300 MG capsule   Oral   Take 1 capsule (300 mg total) by mouth 2 (two) times daily as needed. For anxiety   30 capsule   0   . hydrOXYzine (ATARAX/VISTARIL) 25 MG tablet   Oral   Take 1 tablet (25 mg total) by mouth 2 (two) times daily as needed for anxiety.   30 tablet   0   . ibuprofen (ADVIL,MOTRIN) 800 MG tablet   Oral   Take 800 mg by mouth every 6 (six) hours as needed for pain.         . Multiple Vitamins-Minerals (MULTIVITAMINS THER. W/MINERALS) TABS   Oral   Take 1 tablet by mouth daily.         Marland Kitchen oxyCODONE-acetaminophen (PERCOCET) 5-325 MG per tablet   Oral   Take 1-2 tablets by mouth every 6 (six) hours as needed for pain.   10 tablet   0   . QUEtiapine (SEROQUEL) 25 MG tablet   Oral   Take 3 tablets (75 mg total) by mouth at  bedtime. For thoughts/mood   30 tablet   0   . sertraline (ZOLOFT) 25 MG tablet   Oral   Take 3 tablets (75 mg total) by mouth daily. For depression/anxiety   30 tablet   0     BP 118/73  Pulse 93  Temp(Src) 98.3 F (36.8 C) (Oral)  SpO2 95%  Physical Exam  Constitutional: He is oriented to person, place, and time. He appears well-developed and well-nourished.  HENT:  Head: Normocephalic.  Eyes: Conjunctivae are normal.  Neck: Normal range of motion. Neck supple.  Cardiovascular: Normal rate and regular rhythm.   Pulmonary/Chest: Effort normal and breath sounds normal.  Abdominal: Soft. Bowel sounds are normal. There is no tenderness. There is no rebound and no guarding.  Musculoskeletal: Normal range of motion.  Neurological: He is alert and oriented to person, place, and time.  Skin: Skin is warm and dry. No rash noted.  Psychiatric:  Depressed affect, quiet but cooperative.      ED Course  Procedures (including critical care time)  Labs Reviewed  ACETAMINOPHEN LEVEL  CBC  COMPREHENSIVE METABOLIC PANEL  ETHANOL  SALICYLATE LEVEL  URINE RAPID DRUG SCREEN (HOSP PERFORMED)   No results found.   No diagnosis found.    MDM  Patient to be evaluated for SI with history of suicide attempt.         Arnoldo Hooker, PA-C 04/08/13 1735

## 2013-04-08 NOTE — ED Provider Notes (Signed)
Medical screening examination/treatment/procedure(s) were performed by non-physician practitioner and as supervising physician I was immediately available for consultation/collaboration.   Gwyneth Sprout, MD 04/08/13 [id number]

## 2013-04-08 NOTE — ED Notes (Signed)
Pt belongings logged and placed in storage container.  Valuable items stored with security, and medications logged and stored with Pharmacy.  Copies of logs placed with Pt's chart.

## 2013-04-09 ENCOUNTER — Inpatient Hospital Stay (HOSPITAL_COMMUNITY)
Admission: AD | Admit: 2013-04-09 | Discharge: 2013-04-13 | DRG: 885 | Disposition: A | Discharge status: Home / Self Care | Payer: Federal, State, Local not specified - Other | Source: Ambulatory Visit | Attending: Psychiatry | Admitting: Psychiatry

## 2013-04-09 DIAGNOSIS — F339 Major depressive disorder, recurrent, unspecified: Secondary | ICD-10-CM

## 2013-04-09 DIAGNOSIS — Z79899 Other long term (current) drug therapy: Secondary | ICD-10-CM

## 2013-04-09 DIAGNOSIS — F329 Major depressive disorder, single episode, unspecified: Secondary | ICD-10-CM

## 2013-04-09 DIAGNOSIS — R45851 Suicidal ideations: Secondary | ICD-10-CM

## 2013-04-09 DIAGNOSIS — F419 Anxiety disorder, unspecified: Secondary | ICD-10-CM

## 2013-04-09 DIAGNOSIS — F332 Major depressive disorder, recurrent severe without psychotic features: Principal | ICD-10-CM | POA: Diagnosis present

## 2013-04-09 DIAGNOSIS — F411 Generalized anxiety disorder: Secondary | ICD-10-CM | POA: Diagnosis present

## 2013-04-09 LAB — RAPID URINE DRUG SCREEN, HOSP PERFORMED: Benzodiazepines: NOT DETECTED

## 2013-04-09 MED ORDER — NICOTINE 21 MG/24HR TD PT24
21.0000 mg | MEDICATED_PATCH | Freq: Every day | TRANSDERMAL | Status: DC
  Administered 2013-04-09 – 2013-04-13 (×5): 21 mg via TRANSDERMAL
  Filled 2013-04-09 (×8): qty 1

## 2013-04-09 MED ORDER — ADULT MULTIVITAMIN W/MINERALS CH
1.0000 | ORAL_TABLET | Freq: Every day | ORAL | Status: DC
  Administered 2013-04-09 – 2013-04-13 (×5): 1 via ORAL
  Filled 2013-04-09 (×6): qty 1

## 2013-04-09 MED ORDER — ALUM & MAG HYDROXIDE-SIMETH 200-200-20 MG/5ML PO SUSP: 30.0000 mL | ORAL | Status: DC | PRN

## 2013-04-09 MED ORDER — MAGNESIUM HYDROXIDE 400 MG/5ML PO SUSP: 30.0000 mL | Freq: Every day | ORAL | Status: DC | PRN

## 2013-04-09 MED ORDER — ACETAMINOPHEN 325 MG PO TABS: 650.0000 mg | ORAL_TABLET | Freq: Four times a day (QID) | ORAL | Status: DC | PRN

## 2013-04-09 MED ORDER — HYDROXYZINE HCL 25 MG PO TABS
25.0000 mg | ORAL_TABLET | Freq: Two times a day (BID) | ORAL | Status: DC | PRN
  Administered 2013-04-09 – 2013-04-12 (×4): 25 mg via ORAL
  Filled 2013-04-09: qty 30

## 2013-04-09 MED ORDER — GABAPENTIN 300 MG PO CAPS
300.0000 mg | ORAL_CAPSULE | Freq: Two times a day (BID) | ORAL | Status: DC | PRN
  Administered 2013-04-10: 300 mg via ORAL
  Filled 2013-04-09: qty 1

## 2013-04-09 MED ORDER — TRAZODONE HCL 50 MG PO TABS
50.0000 mg | ORAL_TABLET | Freq: Every evening | ORAL | Status: DC | PRN
  Administered 2013-04-09 – 2013-04-12 (×2): 50 mg via ORAL
  Filled 2013-04-09: qty 1
  Filled 2013-04-09: qty 14
  Filled 2013-04-09: qty 1

## 2013-04-09 MED ORDER — BUSPIRONE HCL 10 MG PO TABS
10.0000 mg | ORAL_TABLET | Freq: Three times a day (TID) | ORAL | Status: DC
  Administered 2013-04-09 – 2013-04-13 (×13): 10 mg via ORAL
  Filled 2013-04-09 (×15): qty 1

## 2013-04-09 MED ORDER — SERTRALINE HCL 25 MG PO TABS
75.0000 mg | ORAL_TABLET | Freq: Every day | ORAL | Status: DC
  Administered 2013-04-09 – 2013-04-11 (×3): 75 mg via ORAL
  Filled 2013-04-09 (×4): qty 3

## 2013-04-09 NOTE — BH Assessment (Signed)
Assessment Note   Nathaniel Warren is an 47 y.o. male who presents voluntarily to [facility] for suicidal ideation with a plan and intent to cut his wrists.  He reports that last night he attempted to do so, but was stopped by one of his friends. He is currently under the care of [facility] for medication management, but is not receiving any talk therapy.  He was hospitalized earlier this year for another suicide attempt.  He reports his primary stressor is looking for a job and not being able to find work.  He is currently living with friends.  He also endorses racing thoughts, insomnia, decreased concentration, decreased appetite, feelings of worthlessness, irritability and guilt.  He denies HI, now or in the last six months, or psychosis.  He states that he drank last night while attempting to end his life, but does not drink with regularity and denies drug use.  He cannot contract for safety and is appropriate for inpatient admission  Axis I: Bipolar, Depressed Axis II: Deferred Axis III:  Past Medical History  Diagnosis Date  . Stroke   . Seizures   . Bipolar 1 disorder   . Depression    Axis IV: economic problems, housing problems and occupational problems Axis V: 31-40 impairment in reality testing  Past Medical History:  Past Medical History  Diagnosis Date  . Stroke   . Seizures   . Bipolar 1 disorder   . Depression     Past Surgical History  Procedure Laterality Date  . Hip surgery    . Ankle surgery    . Splenectomy      Family History: History reviewed. No pertinent family history.  Social History:  reports that he has been smoking Cigarettes.  He has a 45 pack-year smoking history. He does not have any smokeless tobacco history on file. He reports that  drinks alcohol. He reports that he uses illicit drugs.  Additional Social History:  Alcohol / Drug Use History of alcohol / drug use?: No history of alcohol / drug abuse  CIWA: CIWA-Ar BP: 110/71 mmHg Pulse Rate:  88 COWS:    Allergies:  Allergies  Allergen Reactions  . Asa (Aspirin) Other (See Comments)    High doses makes nose bleed    Home Medications:  (Not in a hospital admission)  OB/GYN Status:  No LMP for male patient.  General Assessment Data Location of Assessment: [facility] ED Living Arrangements: Non-relatives/Friends Can pt return to current living arrangement?: Yes Admission Status: Voluntary Is patient capable of signing voluntary admission?: Yes Transfer from: Acute Hospital Referral Source: Self/Family/Friend  Education Status Is patient currently in school?: No Highest grade of school patient has completed: high school plus trade school  Risk to self Suicidal Ideation: Yes-Currently Present Suicidal Intent: Yes-Currently Present Is patient at risk for suicide?: Yes Suicidal Plan?: Yes-Currently Present Specify Current Suicidal Plan: cut wrists, attempted to do so last night, but a friend stopped him Access to Means: Yes Specify Access to Suicidal Means: brought a knife to the hospital What has been your use of drugs/alcohol within the last 12 months?: drank last night Previous Attempts/Gestures: Yes How many times?: 1 (attempted to jump off bridge earlier this year) Triggers for Past Attempts: Other (Comment) (job) Intentional Self Injurious Behavior: None Family Suicide History: Yes (mother attempted suicide) Recent stressful life event(s): Job Loss;Financial Problems Persecutory voices/beliefs?: No Depression: Yes Depression Symptoms: Despondent;Insomnia;Tearfulness;Isolating;Fatigue;Guilt;Loss of interest in usual pleasures;Feeling worthless/self pity;Feeling angry/irritable Substance abuse history and/or treatment for substance  abuse?: No Suicide prevention information given to non-admitted patients: Not applicable  Risk to Others Homicidal Ideation: No Thoughts of Harm to Others: No Current Homicidal Intent: No Current Homicidal Plan: No Access to Homicidal  Means: No History of harm to others?: No Assessment of Violence: None Noted Does patient have access to weapons?: No Criminal Charges Pending?: No Does patient have a court date: No  Psychosis Hallucinations: None noted Delusions: None noted  Mental Status Report Appear/Hygiene: Other (Comment) (unremarkable) Eye Contact: Fair Motor Activity: Freedom of movement Speech: Logical/coherent;Soft Level of Consciousness: Quiet/awake Mood: Depressed Affect: Appropriate to circumstance;Blunted Anxiety Level: None Thought Processes: Coherent;Relevant Judgement: Unimpaired Orientation: Place;Person;Situation;Time Obsessive Compulsive Thoughts/Behaviors: Moderate  Cognitive Functioning Concentration: Decreased Memory: Recent Intact;Remote Intact IQ: Average Insight: Fair Impulse Control: Fair Appetite: Poor Weight Loss:  (unk) Sleep: Decreased Total Hours of Sleep: 4 Vegetative Symptoms: Staying in bed  ADLScreening [facility] Assessment Services) Patient's cognitive ability adequate to safely complete daily activities?: Yes Patient able to express need for assistance with ADLs?: Yes Independently performs ADLs?: Yes (appropriate for developmental age)  Abuse/Neglect [facility]) Physical Abuse: Denies Verbal Abuse: Denies Sexual Abuse: Denies  Prior Inpatient Therapy Prior Inpatient Therapy: Yes Prior Therapy Dates: 2014 Prior Therapy Facilty/Provider(s): [facility] Reason for Treatment: Depression, SI  Prior Outpatient Therapy Prior Outpatient Therapy: Yes Prior Therapy Dates: ongoing Prior Therapy Facilty/Provider(s): [facility] Reason for Treatment: MEd management  ADL Screening (condition at time of admission) Patient's cognitive ability adequate to safely complete daily activities?: Yes Patient able to express need for assistance with ADLs?: Yes Independently performs ADLs?: Yes (appropriate for developmental age) Weakness of Legs: None Weakness of Arms/Hands: None        Abuse/Neglect Assessment (Assessment to be complete while patient is alone) Physical Abuse: Denies Verbal Abuse: Denies Sexual Abuse: Denies Exploitation of patient/patient's resources: Denies     Merchant navy officer (For Healthcare) Advance Directive: Patient does not have advance directive;Patient would not like information Nutrition Screen- [facility] Adult/WL/AP Patient's home diet: Regular Have you recently lost weight without trying?: No Have you been eating poorly because of a decreased appetite?: No Malnutrition Screening Tool Score: 0  Additional Information 1:1 In Past 12 Months?: No CIRT Risk: No Elopement Risk: No Does patient have medical clearance?: Yes     Disposition:  Disposition Initial Assessment Completed for this Encounter: Yes Disposition of Patient: Inpatient treatment program Type of inpatient treatment program: Adult  On Site Evaluation by:  Lajuana Ripple Reviewed with Physician:  Peri Jefferson 04/09/2013 12:31 AM

## 2013-04-09 NOTE — [facility] Suicide Risk Assessment (Signed)
Suicide Risk Assessment  Admission Assessment     Nursing information obtained from:  Patient Demographic factors:  Male;Caucasian;Low socioeconomic status Current Mental Status:  Self-harm thoughts Loss Factors:  Financial problems / change in socioeconomic status Historical Factors:  Prior suicide attempts Risk Reduction Factors:  Positive social support;Positive therapeutic relationship  CLINICAL FACTORS:   Depression:   Anhedonia Comorbid alcohol abuse/dependence Hopelessness Impulsivity Insomnia Recent sense of peace/wellbeing Severe Alcohol/Substance Abuse/Dependencies Unstable or Poor Therapeutic Relationship Previous Psychiatric Diagnoses and Treatments  COGNITIVE FEATURES THAT CONTRIBUTE TO RISK:  Closed-mindedness Loss of executive function Polarized thinking    SUICIDE RISK:   Moderate:  Frequent suicidal ideation with limited intensity, and duration, some specificity in terms of plans, no associated intent, good self-control, limited dysphoria/symptomatology, some risk factors present, and identifiable protective factors, including available and accessible social support.  PLAN OF CARE: This is second acute psychiatric hospitalization as voluntary from [facility] ER for depression, suicidal attempt, self injurious behavior and alcohol abuse vs dependence. He was able contract for safety in hospital only. He will be receiving medication management, individual and group therapy. His main stress is loss of his job and has limited psychosocial support.   I certify that inpatient services furnished can reasonably be expected to improve the patient's condition.  Jorryn Hershberger,JANARDHAHA R. 04/09/2013, 11:27 AM

## 2013-04-09 NOTE — [facility] Group Notes (Signed)
[facility] Group Notes:  (Clinical Social Work)  04/09/2013     10-11AM  Summary of Progress/Problems:   The main focus of today's process group was for the patient to identify ways in which they have in the past sabotaged their own recovery. Motivational Interviewing was utilized to ask the group members what they get out of their substance use, and what reasons they may have for wanting to change.  The Stages of Change were explained using a handout, and patients identified where they currently are with regard to stages of change.  The patient expressed that he did not feel he could share in group yet.  He did state that he is currently in the contemplation stage of change.  Type of Therapy:  Group Therapy - Process   Participation Level:  Minimal  Participation Quality:  Attentive  Affect:  Depressed  Cognitive:  Oriented  Insight:  Limited  Engagement in Therapy:  Limited  Modes of Intervention:  Education, Support and Processing, Motivational Interviewing  Ambrose Mantle, LCSW 04/09/2013, 11:16 AM

## 2013-04-09 NOTE — ED Notes (Signed)
ACT team member in room with pt 
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
## 2013-04-09 NOTE — Progress Notes (Signed)
Psychoeducational Group Note  Date:  04/09/2013 Time:  0945 am  Group Topic/Focus:  Identifying Needs:   The focus of this group is to help patients identify their personal needs that have been historically problematic and identify healthy behaviors to address their needs.  Participation Level:  Minimal  Participation Quality:  Attentive  Affect:  Appropriate  Cognitive:  Alert  Insight:  Improving  Engagement in Group:  Improving  Additional Comments:    Andrena Mews 04/09/2013,2:44 PM
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
## 2013-04-09 NOTE — Progress Notes (Signed)
Patient has been accepted at [facility] by Vanessa Ralphs NP to the services of Dr. Dub Mikes; room 306.2

## 2013-04-09 NOTE — ED Provider Notes (Signed)
The patient is stable this morning awaiting psychiatric placement  Filed Vitals:   04/09/13 0603  BP: 148/70  Pulse:   Temp: 98.3 F (36.8 C)  Resp: 16     Celene Kras, MD 04/09/13 0710

## 2013-04-09 NOTE — ED Provider Notes (Signed)
Patient accepted to [facility]. Dr. Dub Mikes accepts.  Per RN, no recent acute events / changes.  BP 110/71  Pulse 88  Temp(Src) 97.2 F (36.2 C) (Oral)  Resp 16  SpO2 99%   Gerhard Munch, MD 04/09/13 0510

## 2013-04-10 DIAGNOSIS — F411 Generalized anxiety disorder: Secondary | ICD-10-CM

## 2013-04-10 DIAGNOSIS — F332 Major depressive disorder, recurrent severe without psychotic features: Principal | ICD-10-CM | POA: Diagnosis present

## 2013-04-10 MED ORDER — CLONAZEPAM 1 MG PO TABS
1.0000 mg | ORAL_TABLET | Freq: Two times a day (BID) | ORAL | Status: DC | PRN
  Administered 2013-04-11 – 2013-04-12 (×2): 1 mg via ORAL
  Filled 2013-04-10 (×2): qty 1

## 2013-04-10 MED ORDER — QUETIAPINE FUMARATE 100 MG PO TABS
100.0000 mg | ORAL_TABLET | Freq: Every day | ORAL | Status: DC
  Administered 2013-04-10: 100 mg via ORAL
  Filled 2013-04-10 (×3): qty 1

## 2013-04-10 MED ORDER — GABAPENTIN 400 MG PO CAPS
400.0000 mg | ORAL_CAPSULE | Freq: Three times a day (TID) | ORAL | Status: DC
  Administered 2013-04-11: 400 mg via ORAL
  Filled 2013-04-10 (×4): qty 1
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
## 2013-04-10 NOTE — Progress Notes (Signed)
Patient did attend the evening speaker AA meeting.  

## 2013-04-10 NOTE — Progress Notes (Signed)
Patient ID: Nathaniel Warren, male   DOB: [birth year], 46 y.o.   MRN: [id number] D)  Was out on the hall this evening, interacts with select peers, affect remains rather flat, depressed.  Attended group, came to med window afterward for hs meds, requested something for anxiety and sleep, was given vistaril and trazodone, no specific c/o's.  Denies SI/HI. A)  Will continue to monitor for safety, continue POC. R)  Safety maintained.

## 2013-04-10 NOTE — B[facility] Counselor (Signed)
Adult Psychosocial Assessment Update Interdisciplinary Team  Previous [facility] admissions/discharges:  Admissions Discharges  Date:01/09/13 Date:01/21/13  Date: Date:  Date: Date:  Date: Date:  Date: Date:   Changes since the last Psychosocial Assessment (including adherence to outpatient mental health and/or substance abuse treatment, situational issues contributing to decompensation and/or relapse). Patient has been living in [place] with close friend Nathaniel Warren at [phone])  While looking for employment. Wife and children have remained in [place] with  Other family members. Patient reports job search has turned up nothing and he has be-  Come increasingly depressed. Heard drinking a fifth of liquor in 15 minutes would be  Fatal so tried that and when he only became sick he tried to cut wrists and was stopped  By two other men.  Patient reports he follows up at [facility] and has been compliant with medications prescribed at [facility]   Discharge Plan 1. Will you be returning to the same living situation after discharge?   Yes:X No:      If no, what is your plan?    In [employer] with friend       2. Would you like a referral for services when you are discharged? Yes: X   If yes, for what services?  No:       Therapy in addition to medication management       Summary and Recommendations (to be completed by the evaluator) Patient is 47 YO married unemployed caucasian male admitted with diagnosis of   Bipolar Depressed after suicide attempt.  Patient is separated from family while he  Looks for work in [place] as he reports there is no work to be found in [place]  Where wife and children remain while staying with other relatives. Patient reports  Depression has been worse since he experienced stroke in 2012. Financial status   Is depressing in addition to other family stressors such as daughter receiving DUI this   Past week. Patient will benefit from  crisis stabilization, group therapy and psycho-  Education in addition to medication evaluation and discharge planning.          Signature:  Clide Dales, 04/10/2013 3:20 PM

## 2013-04-10 NOTE — H&P (Signed)
Patient was seen and case discussed with physician extender who performed assessment. Reviewed the information documented and agree with the treatment plan.  Kamori Barbier,JANARDHAHA R. 04/10/2013 1:35 PM 

## 2013-04-10 NOTE — B[facility] Group Notes (Signed)
[facility] Group Notes:  (Clinical Social Work)  04/10/2013  10:00-11:00AM  Summary of Progress/Problems:   The main focus of today's process group was to   identify the patient's current support system and decide on other supports that can be put in place.  Four definitions/levels of support were discussed and an exercise was utilized to show how much stronger we become with additional supports.  An emphasis was placed on using counselor, doctor, therapy groups, 12-step groups, and problem-specific support groups to expand supports, as well as doing something different than has been done before. The patient expressed a willingness to think about adding a therapist to his existing supports of wife and children.  He is reluctant, however.   Type of Therapy:  Process Group with Motivational Interviewing  Participation Level:  Minimal  Participation Quality:  Attentive  Affect:  Blunted  Cognitive:  Oriented  Insight:  Developing/Improving  Engagement in Therapy:  Developing/Improving  Modes of Intervention:   Education, Support and Processing, Activity  Ambrose Mantle, LCSW 04/10/2013, 11:18 AM

## 2013-04-10 NOTE — Progress Notes (Signed)
[facility] MD Progress Note  04/10/2013 5:54 PM Nathaniel Warren  MRN:  [id number]  Subjective:  Nathaniel Warren reports, that today has been rough. Received some bad news from his wife that his daughter was arrested last night and charged with DWI. He says it's been a rough day of increased depression, anxiety and worrying too much. Continue to express SI without plans and or intent. Rated depression at #9  Diagnosis:   Axis I: Generalized Anxiety Disorder and MDD (major depressive disorder), recurrent episode, severe Axis II: Deferred Axis III:  Past Medical History  Diagnosis Date  . Stroke   . Seizures   . Bipolar 1 disorder   . Depression    Axis IV: other psychosocial or environmental problems Axis V: 41-50 serious symptoms  ADL's:  Fairly intact  Sleep: Poor  Appetite:  Fair  Suicidal Ideation: "yes, off and on" Plan:  Denies Intent:  Denies Means:  Denies Homicidal Ideation:  Plan:  Denies Intent:  Denies Means:  Denies  AEB (as evidenced by): per patient's reports  Psychiatric Specialty Exam: Review of Systems  Constitutional: Negative.   HENT: Negative.   Eyes: Negative.   Respiratory: Negative.   Cardiovascular: Negative.   Gastrointestinal: Negative.   Genitourinary: Negative.   Musculoskeletal: Negative.   Skin: Negative.        Self inflicted to inner left wrist.  Neurological: Negative.   Endo/Heme/Allergies: Negative.   Psychiatric/Behavioral: Positive for depression, suicidal ideas and hallucinations. Negative for memory loss and substance abuse. The patient is nervous/anxious and has insomnia.     Blood pressure 113/75, pulse 73, temperature 97 F (36.1 C), resp. rate 16.There is no weight on file to calculate BMI.  General Appearance: Fairly Groomed  Patent attorney::  Fair  Speech:  Clear and Coherent  Volume:  Normal  Mood:  Anxious and Depressed, rated #9  Affect:  Flat  Thought Process:  Coherent, Intact and Logical  Orientation:  Full (Time, Place, and  Person)  Thought Content:  Rumination  Suicidal Thoughts:  Yes.  without intent/plan  Homicidal Thoughts:  No  Memory:  Immediate;   Good Recent;   Good Remote;   Good  Judgement:  Fair  Insight:  Fair  Psychomotor Activity:  Restlessness  Concentration:  Fair  Recall:  Good  Akathisia:  No  Handed:  Right  AIMS (if indicated):     Assets:  Desire for Improvement  Sleep:  Number of Hours: 6   Current Medications: Current Facility-Administered Medications  Medication Dose Route Frequency Provider Last Rate Last Dose  . acetaminophen (TYLENOL) tablet 650 mg  650 mg Oral Q6H PRN Sanjuana Kava, NP      . alum & mag hydroxide-simeth (MAALOX/MYLANTA) 200-200-20 MG/5ML suspension 30 mL  30 mL Oral Q4H PRN Sanjuana Kava, NP      . busPIRone (BUSPAR) tablet 10 mg  10 mg Oral TID Sanjuana Kava, NP   10 mg at 04/10/13 1158  . gabapentin (NEURONTIN) capsule 300 mg  300 mg Oral BID PRN Sanjuana Kava, NP   300 mg at 04/10/13 1256  . hydrOXYzine (ATARAX/VISTARIL) tablet 25 mg  25 mg Oral BID PRN Sanjuana Kava, NP   25 mg at 04/10/13 1256  . magnesium hydroxide (MILK OF MAGNESIA) suspension 30 mL  30 mL Oral Daily PRN Sanjuana Kava, NP      . multivitamin with minerals tablet 1 tablet  1 tablet Oral Daily Sanjuana Kava, NP  1 tablet at 04/10/13 0817  . nicotine (NICODERM CQ - dosed in mg/24 hours) patch 21 mg  21 mg Transdermal Q0600 Sanjuana Kava, NP   21 mg at 04/10/13 0817  . sertraline (ZOLOFT) tablet 75 mg  75 mg Oral Daily Sanjuana Kava, NP   75 mg at 04/10/13 0818  . traZODone (DESYREL) tablet 50 mg  50 mg Oral QHS PRN Sanjuana Kava, NP   50 mg at 04/09/13 2201    Lab Results:  Results for orders placed during the hospital encounter of 04/08/13 (from the past 48 hour(s))  URINE RAPID DRUG SCREEN (HOSP PERFORMED)     Status: None   Collection Time    04/09/13  3:53 AM      Result Value Range   Opiates NONE DETECTED  NONE DETECTED   Cocaine NONE DETECTED  NONE DETECTED    Benzodiazepines NONE DETECTED  NONE DETECTED   Amphetamines NONE DETECTED  NONE DETECTED   Tetrahydrocannabinol NONE DETECTED  NONE DETECTED   Barbiturates NONE DETECTED  NONE DETECTED   Comment:            DRUG SCREEN FOR MEDICAL PURPOSES     ONLY.  IF CONFIRMATION IS NEEDED     FOR ANY PURPOSE, NOTIFY LAB     WITHIN 5 DAYS.                LOWEST DETECTABLE LIMITS     FOR URINE DRUG SCREEN     Drug Class       Cutoff (ng/mL)     Amphetamine      1000     Barbiturate      200     Benzodiazepine   200     Tricyclics       300     Opiates          300     Cocaine          300     THC              50    Physical Findings: AIMS:  , ,  ,  ,    CIWA:  CIWA-Ar Total: 0 COWS:     Treatment Plan Summary: Daily contact with patient to assess and evaluate symptoms and progress in treatment Medication management  Plan: Supportive approach/coping skills/relapse prevention. Increased Neuronton from 300 mg prn to 400 mg tid. Encouraged out of room, participation in group sessions and application of coping skills when distressed. Will continue to monitor response to/adverse effects of medications in use to assure effectiveness. Continue to monitor mood, behavior and interaction with staff and other patients. Continue current plan of care.  Medical Decision Making Problem Points:  Established problem, stable/improving (1) Data Points:  Review of medication regiment & side effects (2) Review of new medications or change in dosage (2)  I certify that inpatient services furnished can reasonably be expected to improve the patient's condition.   Armandina Stammer I 04/10/2013, 5:54 PM

## 2013-04-10 NOTE — Progress Notes (Signed)
Psychoeducational Group Note  Date:  04/10/2013 Time:  0945 am  Group Topic/Focus:  Making Healthy Choices:   The focus of this group is to help patients identify negative/unhealthy choices they were using prior to admission and identify positive/healthier coping strategies to replace them upon discharge.  Participation Level:  Minimal  Participation Quality:  Appropriate  Affect:  Appropriate  Cognitive:  Alert  Insight:  Developing/Improving  Engagement in Group:  Developing/Improving  Additional Comments:    Andrena Mews 04/10/2013, 10:29 AM

## 2013-04-10 NOTE — Progress Notes (Signed)
Adult Psychoeducational Group Note  Date:  04/10/2013 Time:  8:43 PM  Group Topic/Focus:  Goals Group:   The focus of this group is to help patients establish daily goals to achieve during treatment and discuss how the patient can incorporate goal setting into their daily lives to aide in recovery.  Participation Level:  Active  Participation Quality:  Appropriate  Affect:  Appropriate  Cognitive:  Appropriate  Insight: Appropriate  Engagement in Group:  Engaged  Modes of Intervention:  Discussion  Additional Comments:  Attended AA  Aldona Lento 04/10/2013, 8:43 PM

## 2013-04-11 DIAGNOSIS — F410 Panic disorder [episodic paroxysmal anxiety] without agoraphobia: Secondary | ICD-10-CM

## 2013-04-11 MED ORDER — SERTRALINE HCL 100 MG PO TABS
100.0000 mg | ORAL_TABLET | Freq: Every day | ORAL | Status: DC
  Administered 2013-04-12 – 2013-04-13 (×2): 100 mg via ORAL
  Filled 2013-04-11 (×3): qty 1

## 2013-04-11 MED ORDER — QUETIAPINE FUMARATE 200 MG PO TABS
200.0000 mg | ORAL_TABLET | Freq: Every day | ORAL | Status: DC
  Administered 2013-04-11 – 2013-04-12 (×2): 200 mg via ORAL
  Filled 2013-04-11 (×3): qty 1

## 2013-04-11 MED ORDER — GABAPENTIN 300 MG PO CAPS
600.0000 mg | ORAL_CAPSULE | Freq: Three times a day (TID) | ORAL | Status: DC
  Administered 2013-04-11 – 2013-04-13 (×7): 600 mg via ORAL
  Filled 2013-04-11 (×10): qty 2

## 2013-04-11 NOTE — Progress Notes (Signed)
Patient ID: Nathaniel Warren, male   DOB: [birth year], 47 y.o.   MRN: [id number] He has been up and to groups interacting with peers and staff. Self inventory; depression and hopelessness at 9,positive SI thoughts this Am denies SI thoughts at this time. He c/o migraine and was given a PRN. Says that he has a history of having migraines all his life.

## 2013-04-11 NOTE — Progress Notes (Signed)
Pt attended AA group; affect- flat; appeared attentive.  

## 2013-04-11 NOTE — [facility] Group Notes (Signed)
[facility] LCSW Group Therapy  04/11/2013 1:15 PM  Type of Therapy: Group Therapy 1:15 to 2:30 PM  Participation Level: Attentive and Sharing  Participation Quality:  Developing, Improving  Affect:  Depressed and flat  Cognitive:  Alert and Oriented  Insight: Developing  Engagement in Therapy: Improved  Modes of Intervention: Discussion, exploration, education, support and reality testing  Summary of Progress/Problems:  Group discussion focused on what patient's see as their own obstacles to recovery.  Patient shares belief that his sense of failure will continue to be difficult to deal. Patient shared about losing his job after a stroke and now dealing with depression and unemployment.  CSW encouraged patient to share what brought him into hospital and he did so after some hesitation.  Kenner shared about his suicide attempt and received support from numerous group members.  Nathaniel Warren

## 2013-04-11 NOTE — BHH Group Notes (Signed)
[facility] LCSW Aftercare Discharge Planning Group Note   04/11/2013 8:45 AM  Participation Quality:  Appropriate  Mood/Affect:  Depressed, Anxious and flat  Depression Rating:  9  Anxiety Rating:  8  Thoughts of Suicide:  Last were Sunday Evening Will you contract for safety?  Yes  Current AVH:  No  Plan for Discharge/Comments:  Will schedule patient for medication management and therapy in [facility]  [employer]:  Friend or [employer]  Supports:  Local friend, family which is in [place], [place]  [place], Julious Payer

## 2013-04-11 NOTE — Tx Team (Signed)
Interdisciplinary Treatment Plan Update (Adult)  Date: 04/11/2013  Time Reviewed: 10:01 AM   Progress in Treatment: Attending groups: Yes Participating in groups: Yes Taking medication as prescribed:  Yes Tolerating medication:  Yes Family/Significant othe contact made: Not as yet Patient understands diagnosis: Yes Discussing patient identified problems/goals with staff: Yes Medical problems stabilized or resolved:  Yes Denies suicidal/homicidal ideation: No Patient has not harmed self or Others: Yes  New problem(s) identified: None Identified  Discharge Plan or Barriers:  CSW to refer patient for medication management and therapy, also for employment support through [employer]  Additional comments: N/A  Reason for Continuation of Hospitalization: Aggression Depression Medication stabilization Suicidal ideation Withdrawal symptoms  Estimated length of stay: 3 days  For review of initial/current patient goals, please see plan of care.  Attendees: Patient:     Family:     Physician:  Geoffery Lyons 04/11/2013 10:01 AM   Nursing:   Brett Canales, RN  04/11/2013 10:01 AM   Clinical Social Worker Ronda Fairly 04/11/2013 10:01 AM   Other:  Liliane Bade, Community Care Coordinator 04/11/2013 10:01 AM   Other:  Leland Johns Psych Student 04/11/2013 10:01 AM   Other:  Robbie Louis, RN 04/11/2013 10:01 AM   Other:   04/11/2013 10:01 AM    Scribe for Treatment Team:   Carney Bern, LCSWA  04/11/2013 10:01 AM

## 2013-04-11 NOTE — Progress Notes (Signed)
[facility] MD Progress Note  04/11/2013 2:04 PM Nathaniel Warren  MRN:  [id number] Subjective: Patient was admitted from [facility] on Saturday after suicidal attempt by cutting his left wrist.  Left wrist is covered with dry dressing.  He reports economic difficulties, poor health, loss of house and cars and children having to stay with family members as his stressors.  He is still reporting hopelessness, helplessness and severe anxiety.  He is worried about where he will be staying after discharge.  At the time of this interview he denied suicidal ideation but states he is still depressed and will not hurt self.  He also reports he stopped his prescribed antidepressant on his own few months ago. Diagnosis:   Axis I: Generalized Anxiety Disorder, Major Depression, Recurrent severe and Panic Disorder Axis II: Deferred Axis III:  Past Medical History  Diagnosis Date  . Stroke   . Seizures   . Bipolar 1 disorder   . Depression    Axis IV: economic problems, housing problems, occupational problems, other psychosocial or environmental problems, problems related to social environment and problems with primary support group Axis V: 51-60 moderate symptoms  ADL's:  Intact  Sleep: Good  Appetite:  Good  Suicidal Ideation:  Plan:  none Intent:  none Means:  none Homicidal Ideation:  Plan:  none Intent:  none Means:  none AEB (as evidenced by):  Psychiatric Specialty Exam: Review of Systems  Constitutional: Negative.   HENT: Negative.   Eyes: Negative.   Respiratory: Negative.   Cardiovascular: Negative.   Gastrointestinal: Negative.   Genitourinary: Negative.   Musculoskeletal: Negative.   Skin: Negative.   Neurological: Negative.   Endo/Heme/Allergies: Negative.   Psychiatric/Behavioral: Positive for depression (Rates depression 9/10.  Feels hopeless and helpless). Negative for suicidal ideas, hallucinations, memory loss and substance abuse. The patient is nervous/anxious  (Rates anxiety at 9/10, had panic attack yesterday). The patient does not have insomnia.     Blood pressure 92/60, pulse 80, temperature 98.2 F (36.8 C), temperature source Oral, resp. rate 20.There is no weight on file to calculate BMI.  General Appearance: Disheveled  Eye Solicitor::  Fair  Speech:  Slow  Volume:  Normal  Mood:  Angry, Anxious, Depressed and Hopeless  Affect:  Congruent and Depressed  Thought Process:  Coherent  Orientation:  Full (Time, Place, and Person)  Thought Content:  WDL  Suicidal Thoughts:  No  Homicidal Thoughts:  No  Memory:  Immediate;   Good Recent;   Good Remote;   Good  Judgement:  Poor  Insight:  Fair  Psychomotor Activity:  Normal  Concentration:  Fair  Recall:  Good  Akathisia:  No  Handed:  Right  AIMS (if indicated):     Assets:  Communication Skills Desire for Improvement  Sleep:  Number of Hours: 6.25   Current Medications: Current Facility-Administered Medications  Medication Dose Route Frequency Provider Last Rate Last Dose  . acetaminophen (TYLENOL) tablet 650 mg  650 mg Oral Q6H PRN Sanjuana Kava, NP      . alum & mag hydroxide-simeth (MAALOX/MYLANTA) 200-200-20 MG/5ML suspension 30 mL  30 mL Oral Q4H PRN Sanjuana Kava, NP      . busPIRone (BUSPAR) tablet 10 mg  10 mg Oral TID Sanjuana Kava, NP   10 mg at 04/11/13 1156  . clonazePAM (KLONOPIN) tablet 1 mg  1 mg Oral BID PRN Sanjuana Kava, NP      . gabapentin (NEURONTIN) capsule 600 mg  600 mg Oral TID Rachael Fee, MD   600 mg at 04/11/13 1156  . hydrOXYzine (ATARAX/VISTARIL) tablet 25 mg  25 mg Oral BID PRN Sanjuana Kava, NP   25 mg at 04/10/13 2157  . magnesium hydroxide (MILK OF MAGNESIA) suspension 30 mL  30 mL Oral Daily PRN Sanjuana Kava, NP      . multivitamin with minerals tablet 1 tablet  1 tablet Oral Daily Sanjuana Kava, NP   1 tablet at 04/11/13 [phone]  . nicotine (NICODERM CQ - dosed in mg/24 hours) patch 21 mg  21 mg Transdermal Q0600 Sanjuana Kava, NP   21 mg at  04/11/13 0829  . QUEtiapine (SEROQUEL) tablet 200 mg  200 mg Oral QHS Rachael Fee, MD      . Melene Muller ON 04/12/2013] sertraline (ZOLOFT) tablet 100 mg  100 mg Oral Daily Rachael Fee, MD      . traZODone (DESYREL) tablet 50 mg  50 mg Oral QHS PRN Sanjuana Kava, NP   50 mg at 04/09/13 2201    Lab Results: No results found for this or any previous visit (from the past 48 hour(s)).  Physical Findings: AIMS:  , ,  ,  ,    CIWA:  CIWA-Ar Total: 0 COWS:     Treatment Plan Summary: Patient is restarted on antidepressant and dressing to his Left wrist is provided as needed.  His Seroquel is increased for better sleep tonight.  He is encouraged to participate in group therapy. Daily contact with patient to assess and evaluate symptoms and progress in treatment  Plan: We will continue to monitor patient as needed and provide him with his needs while in the hospital.  Our case management staff will assist him in securing outpatient Psychiatrist and therapist on discharge. Will increase the  Neurontin to 600 mg TID                              Seroquel to 200 mg HS                              Zoloft to 100 mg daily Medical Decision Making Problem Points:  Established problem, stable/improving (1) and Review of psycho-social stressors (1) Data Points:  Review and summation of old records (2) Review of medication regiment & side effects (2)  I certify that inpatient services furnished can reasonably be expected to improve the patient's condition.   Dahlia Byes, C PMHNP-BC 04/11/2013, 2:04 PM

## 2013-04-11 NOTE — Progress Notes (Signed)
Patient ID: Nathaniel Warren, male   DOB: [birth year], 47 y.o.   MRN: [id number]   D: Pt informed writer that he's "feeling a lil better than he did", referring to his adm date. Stated that he "just wanted it all to end". Pt stated that he was told that if you drink a fifth of liquor in 15 mins, "your heart would stop". Pt stated he drank a fifth in 8 mins. Writer asked pt how often he drinks. Pt stated he drinks 3-4 times a year. Stated he was informed that because he attempts suicide after drinking; he was placed on 300 hall. At this time pt states he has no plans after discharge.   A:  Support and encouragement was offered. 15 min checks continued for safety.  R: Pt remains safe.
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
## 2013-04-12 DIAGNOSIS — F1010 Alcohol abuse, uncomplicated: Secondary | ICD-10-CM

## 2013-04-12 NOTE — Progress Notes (Signed)
Patient ID: Nathaniel Warren, male   DOB: [birth year], 47 y.o.   MRN: [id number] He has been  In bed asleep after he received his am medication. He has stated that he was tired and was going to sleep.  Self inventory depression 9, hopelessness 9, no w/d symptoms , positive SI thoughts contracts  For safety and he c/o headache 2/10 but has not requested andy prn medications.

## 2013-04-12 NOTE — [facility] Group Notes (Signed)
[facility] LCSW Group Therapy  04/12/2013 1:15 PM  Type of Therapy:  Group Therapy 1:15 to 2:30 PM  Participation Level:  Appropriate  Participation Quality:  Attentive  Affect: Appropriate  Cognitive: Alert and Oriented  Insight:  None shared  Engagement in Therapy:  Attentive  Modes of Intervention: Discussion, education, socialization and support  Summary of Progress/Problems: Patient attended group presentation by staff member of  [employer] [facility] [employer] ([employer]). Zayd was attentive and appropriate during session.   Clide Dales

## 2013-04-12 NOTE — Progress Notes (Signed)
Recreation Therapy Notes   Date: 05.06.2014 Time: 3:00pm Location: [address] Day Room      Group Topic/Focus: Communication, Journalist, newspaper, Team Building  Participation Level: Active  Participation Quality: Appropriate  Affect: Flat  Cognitive: Appropriate   Additional Comments: Activity: Flip Flop ; Explanation: LRT placed a sheet on the floor. Patients were instructed to stand on the sheet. As a group patients were instructed to flip the sheet over without stepping off of the sheet.   Patient actively particiapted in group activity. Patient emerged as a leader during this activity giving the proper instruction to peers to successfully complete task. Patient contributed to group discussion about the importance of using communication skills, team building skills and problem solving skills outside of hospital to build a support system for recovery.   Marykay Lex Eliette Drumwright, LRT/CTRS  Jearl Klinefelter 04/12/2013 4:49 PM

## 2013-04-12 NOTE — BHH Group Notes (Signed)
[facility] LCSW Aftercare Discharge Planning Group Note   04/12/2013 8:45 AM  Participation Quality:  Did Not Attend   Clide Dales
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
## 2013-04-13 MED ORDER — BUSPIRONE HCL 10 MG PO TABS: 10.0000 mg | ORAL_TABLET | Freq: Three times a day (TID) | ORAL | Status: DC

## 2013-04-13 MED ORDER — THERA M PLUS PO TABS: 1.0000 | ORAL_TABLET | Freq: Every day | ORAL | Status: DC

## 2013-04-13 MED ORDER — QUETIAPINE FUMARATE 200 MG PO TABS: 200.0000 mg | ORAL_TABLET | Freq: Every day | ORAL | Status: DC

## 2013-04-13 MED ORDER — GABAPENTIN 300 MG PO CAPS: 600.0000 mg | ORAL_CAPSULE | Freq: Three times a day (TID) | ORAL | Status: DC

## 2013-04-13 MED ORDER — GABAPENTIN 600 MG PO TABS
600.0000 mg | ORAL_TABLET | Freq: Three times a day (TID) | ORAL | Status: DC
  Filled 2013-04-13: qty 42

## 2013-04-13 MED ORDER — TRAZODONE HCL 50 MG PO TABS: 50.0000 mg | ORAL_TABLET | Freq: Every evening | ORAL | Status: DC | PRN

## 2013-04-13 MED ORDER — HYDROXYZINE HCL 25 MG PO TABS: 25.0000 mg | ORAL_TABLET | Freq: Two times a day (BID) | ORAL | Status: DC | PRN

## 2013-04-13 MED ORDER — CLONAZEPAM 1 MG PO TABS: 1.0000 mg | ORAL_TABLET | Freq: Two times a day (BID) | ORAL | Status: DC | PRN

## 2013-04-13 MED ORDER — SERTRALINE HCL 100 MG PO TABS: 100.0000 mg | ORAL_TABLET | Freq: Every day | ORAL | Status: DC
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
## 2013-04-13 NOTE — Progress Notes (Signed)
Patient ID: Nathaniel Warren, male   DOB: 1966-07-17, 47 y.o.   MRN: [id number]  D: Pt informed the writer that his med changes are helping a "little bit". Stated, his "mind is not racing as much". Pt appeared brighter today than previous day. Pt voiced no comments or concerns at the time of the assessment.   A:  Support and encouragement was offered. 15 min checks continued for safety.  R: Pt remains safe.

## 2013-04-13 NOTE — Progress Notes (Signed)
[facility] Adult Case Management Discharge Plan :  Will you be returning to the same living situation after discharge: Yes,  lives with family friend At discharge, do you have transportation home?:Yes,  bus vouchers Do you have the ability to pay for your medications:Yes,  through [employer] or [employer]  Release of information consent forms completed and in the chart;  Patient's signature needed at discharge.  Patient to Follow up at: Follow-up Information   Follow up with [facility] On . (Go to walk in clinic at [facility] no later than Tuesday 5/13 between the hours of 8AM and 3PM; the earlier in the day you arrive the earlier you are likely to be seen )    Contact information:   [address] [place] [address] [facility] [phone] Valinda Hoar [phone]      Patient denies SI/HI:   Yes,  denies both    Safety Planning and Suicide Prevention discussed:  Yes,  with patient morning of his discharge.   Clide Dales 04/13/2013, 10:36 AM

## 2013-04-13 NOTE — BHH Suicide Risk Assessment (Signed)
Suicide Risk Assessment  Discharge Assessment     Demographic Factors:  Male, Caucasian and Unemployed  Mental Status Per Nursing Assessment::   On Admission:  Self-harm thoughts  Current Mental Status by Physician: In full contact with reality. There are no suicidal ideas, plans or intent. His mood is euthymic, his affect is appropriate. He is willing and motivated to pursue further outpatient treatment. States the medications are helping. He plans to continue to take his medications. He is hopeful to be able to get a job soon, get his family back together.    Loss Factors: Financial problems/change in socioeconomic status  Historical Factors: NA  Risk Reduction Factors:   NA  Continued Clinical Symptoms:  Depression:   Comorbid alcohol abuse/dependence Alcohol/Substance Abuse/Dependencies  Cognitive Features That Contribute To Risk:  Thought constriction (tunnel vision)    Suicide Risk:  Minimal: No identifiable suicidal ideation.  Patients presenting with no risk factors but with morbid ruminations; may be classified as minimal risk based on the severity of the depressive symptoms  Discharge Diagnoses:   AXIS I:  Major Depression recurrent, GAD, Panic Attacks AXIS II:  Deferred AXIS III:   Past Medical History  Diagnosis Date  . Stroke   . Seizures   . Bipolar 1 disorder   . Depression    AXIS IV:  economic problems and occupational problems AXIS V:  61-70 mild symptoms  Plan Of Care/Follow-up recommendations:  Activity:  as tolerated Diet:  regular Will follow up with services at [facility] Is patient on multiple antipsychotic therapies at discharge:  No   Has Patient had three or more failed trials of antipsychotic monotherapy by history:  No  Recommended Plan for Multiple Antipsychotic Therapies: N/A   Vyla Pint A 04/13/2013, 11:44 AM

## 2013-04-13 NOTE — BHH Group Notes (Signed)
[facility] LCSW Aftercare Discharge Planning Group Note   04/13/2013  8:45 AM  Participation Quality:  Appropriate  Mood/Affect:  Flat  Depression Rating:  7  Anxiety Rating:  7  Thoughts of Suicide:  No Will you contract for safety?   Yes  Current AVH:  Negative  Plan for Discharge/Comments:  Return to roommate, follow up at [facility] and Jobs program discussed with CSW  Transportation Means: Bus   Supports: Friend with whom he lives, family and extended family   Dyane Dustman, Julious Payer

## 2013-04-13 NOTE — Progress Notes (Signed)
Discharge Note  D: Patient's mood was appropriate to the circumstance. Patient reported on the self inventory sheet that changes he plans to make to better care for self are to find a therapist and stay on meds.  A: Support and encouragement provided to patient. Administered scheduled medications per ordering MD. Discharge instructions/prescriptions given to patient. Returned belongings to patient.  R: Patient receptive. Denies SI/HI/AVH. Patient d/c without incident to the front lobby. Patient verbalized understanding of discharge instructions and prescriptions.

## 2013-04-18 NOTE — Progress Notes (Signed)
Patient Discharge Instructions:  After Visit Summary (AVS):   Faxed to:  04/18/13 Discharge Summary Note:   Faxed to:  04/18/13 Psychiatric Admission Assessment Note:   Faxed to:  04/18/13 Suicide Risk Assessment - Discharge Assessment:   Faxed to:  04/18/13 Faxed/Sent to the Next Level Care provider:  04/18/13 Faxed to [facility] @ [phone]  Jerelene Redden, 04/18/2013, 3:01 PM

## 2013-04-21 ENCOUNTER — Emergency Department (HOSPITAL_COMMUNITY)
Admission: EM | Admit: 2013-04-21 | Discharge: 2013-04-22 | Disposition: A | Discharge status: Home / Self Care | Payer: Self-pay | Attending: Emergency Medicine | Admitting: Emergency Medicine

## 2013-04-21 DIAGNOSIS — Z79899 Other long term (current) drug therapy: Secondary | ICD-10-CM | POA: Insufficient documentation

## 2013-04-21 DIAGNOSIS — F10929 Alcohol use, unspecified with intoxication, unspecified: Secondary | ICD-10-CM

## 2013-04-21 DIAGNOSIS — F319 Bipolar disorder, unspecified: Secondary | ICD-10-CM | POA: Insufficient documentation

## 2013-04-21 DIAGNOSIS — Z590 Homelessness: Secondary | ICD-10-CM

## 2013-04-21 DIAGNOSIS — F17200 Nicotine dependence, unspecified, uncomplicated: Secondary | ICD-10-CM | POA: Insufficient documentation

## 2013-04-21 DIAGNOSIS — R55 Syncope and collapse: Secondary | ICD-10-CM | POA: Insufficient documentation

## 2013-04-21 DIAGNOSIS — F329 Major depressive disorder, single episode, unspecified: Secondary | ICD-10-CM

## 2013-04-21 DIAGNOSIS — G40909 Epilepsy, unspecified, not intractable, without status epilepticus: Secondary | ICD-10-CM | POA: Insufficient documentation

## 2013-04-21 DIAGNOSIS — R569 Unspecified convulsions: Secondary | ICD-10-CM

## 2013-04-21 DIAGNOSIS — F1010 Alcohol abuse, uncomplicated: Secondary | ICD-10-CM | POA: Insufficient documentation

## 2013-04-21 DIAGNOSIS — R45851 Suicidal ideations: Secondary | ICD-10-CM | POA: Insufficient documentation

## 2013-04-21 DIAGNOSIS — Z8673 Personal history of transient ischemic attack (TIA), and cerebral infarction without residual deficits: Secondary | ICD-10-CM | POA: Insufficient documentation

## 2013-04-21 NOTE — ED Notes (Signed)
Per EMS.  PT ran into a bar, stated he was about to have a seizure, lower himself to the floor and started shaking.  EMS stated they did not notice a postictal state, and that pt was immediately A&O following seizure like activity.

## 2013-04-22 ENCOUNTER — Encounter (HOSPITAL_COMMUNITY): Payer: Self-pay | Admitting: *Deleted

## 2013-04-22 LAB — COMPREHENSIVE METABOLIC PANEL
Alkaline Phosphatase: 81 U/L (ref 39–117)
BUN: 12 mg/dL (ref 6–23)
CO2: 27 mEq/L (ref 19–32)
Chloride: 107 mEq/L (ref 96–112)
GFR calc Af Amer: >90 mL/min (ref >90)
GFR calc non Af Amer: >90 mL/min (ref >90)
Glucose, Bld: 79 mg/dL (ref 70–99)
Potassium: 3.9 mEq/L (ref 3.5–5.1)
Total Bilirubin: 0.2 mg/dL — ABNORMAL LOW (ref 0.3–1.2)
Total Protein: 6.8 g/dL (ref 6.0–8.3)

## 2013-04-22 LAB — RAPID URINE DRUG SCREEN, HOSP PERFORMED
Barbiturates: NOT DETECTED
Tetrahydrocannabinol: NOT DETECTED

## 2013-04-22 LAB — URINALYSIS, ROUTINE W REFLEX MICROSCOPIC
Ketones, ur: NEGATIVE mg/dL
Leukocytes, UA: NEGATIVE
Nitrite: NEGATIVE
Protein, ur: NEGATIVE mg/dL

## 2013-04-22 LAB — CBC WITH DIFFERENTIAL/PLATELET
Eosinophils Absolute: 0.2 10*3/uL (ref 0.0–0.7)
Hemoglobin: 16.4 g/dL (ref 13.0–17.0)
Lymphs Abs: 3.7 10*3/uL (ref 0.7–4.0)
MCH: 33.7 pg (ref 26.0–34.0)
Monocytes Relative: 7 % (ref 3–12)
Neutrophils Relative %: 45 % (ref 43–77)
RBC: 4.86 MIL/uL (ref 4.22–5.81)

## 2013-04-22 MED ORDER — ACETAMINOPHEN 325 MG PO TABS
650.0000 mg | ORAL_TABLET | Freq: Once | ORAL | Status: AC
  Administered 2013-04-22: 650 mg via ORAL
  Filled 2013-04-22: qty 2

## 2013-04-22 MED ORDER — LORAZEPAM 1 MG PO TABS: 1.0000 mg | ORAL_TABLET | Freq: Three times a day (TID) | ORAL | Status: DC | PRN

## 2013-04-22 MED ORDER — SODIUM CHLORIDE 0.9 % IV BOLUS (SEPSIS)
1000.0000 mL | Freq: Once | INTRAVENOUS | Status: AC
  Administered 2013-04-22: 1000 mL via INTRAVENOUS

## 2013-04-22 MED ORDER — CLONAZEPAM 0.5 MG PO TABS: 1.0000 mg | ORAL_TABLET | Freq: Two times a day (BID) | ORAL | Status: DC | PRN

## 2013-04-22 NOTE — ED Notes (Signed)
Pt changed into paper scrubs, wanded by security. Belongings have been inventoried and placed in bin, given to security and medications given to pharmacy.

## 2013-04-22 NOTE — ED Notes (Signed)
Telepsych consult complete

## 2013-04-22 NOTE — Progress Notes (Signed)
Clinical Social Work Department BRIEF PSYCHOSOCIAL ASSESSMENT 04/22/2013  Patient:  Nathaniel Warren, Nathaniel Warren     Account Number:  [id number]     Admit date:  04/21/2013  Clinical Social Worker:  Leron Croak, CLINICAL SOCIAL WORKER  Date/Time:  04/22/2013 09:54 AM  Referred by:  Physician  Date Referred:  04/22/2013 Referred for  Homelessness  Transportation assistance  Other - See comment   Other Referral:   Job resources in the community   Interview type:  Patient Other interview type:    PSYCHOSOCIAL DATA Living Status:  OTHER Admitted from facility:   Level of care:   Primary support name:  Baraa Tubbs [phone] Primary support relationship to patient:  SPOUSE Degree of support available:   Pt currently has limited supports in the area.    CURRENT CONCERNS Current Concerns  Financial Resources  Other - See comment   Other Concerns:   Pt. Needed resources for homelessness and jobs.    SOCIAL WORK ASSESSMENT / PLAN CSW met with Pt at the bedside. CSW introduced self and purpose for visit. Pt was agreeable to CSW assistance and assessment. Pt stated that currently [place] Pt stated that he moved here to find work and that currently he has no income. Pt is married and stated that his wife and daughter are living with her parents until he can find work. Pt has good coping skills and has contacted unemployment office, Medicaid office, and [employer] for assitance. Pt is proactively searching for employment and is having difficulty locating work. Pt is ok d/c'ing back to [facility] in [place] and continue searching for jobs.   Assessment/plan status:  Information/Referral to [employer] Other assessment/ plan:   Information/referral to community resources:   CSW provided the Pt with a listing of [employer] agencies in the local area for assistance obtaining work. CSW also provided Pt with information on free food and low income housing in the  local area.    PATIENT'S/FAMILY'S RESPONSE TO PLAN OF CARE: Pt was very appreciative for assistance and eager to get back to his belongings.     Leron Croak, LCSWA [facility] Emergency Dept.  [phone]

## 2013-04-22 NOTE — BH Assessment (Signed)
Assessment Note   Nathaniel Warren is an 47 y.o. male.  Patient was discharged from [facility] on 04/13/13.  Patient reports that tonight he drank a pint of vodka to get the courage to kill himself by hanging or shooting himself.  Patient said that he felt that he was going to have a seizure so he went into a store to get help.  He said that he does not remember what happened until he arrived at the ED.  Upon discharge he told nurse that he was having thoguhts of killing himself.  He told this clinician that "I have been that way for 6 months."  He says that there are plenty of ways to harm yourself.  "If I had a gun I would have done it a long time ago."  Patient denies HI or A/V hallucinations at this time.  He says that he does not drink often and his consumption of a pint of vodka tonight is the first in two weeks.  His main stressors are financial and not having found a job in many months.  Patient is going to receive a telepsychiatry consult. Axis I: Bipolar, Depressed Axis II: Deferred Axis III:  Past Medical History  Diagnosis Date  . Stroke   . Seizures   . Bipolar 1 disorder   . Depression    Axis IV: economic problems, housing problems, occupational problems, problems related to social environment and problems with primary support group Axis V: 31-40 impairment in reality testing  Past Medical History:  Past Medical History  Diagnosis Date  . Stroke   . Seizures   . Bipolar 1 disorder   . Depression     Past Surgical History  Procedure Laterality Date  . Hip surgery    . Ankle surgery    . Splenectomy      Family History: No family history on file.  Social History:  reports that he has been smoking Cigarettes.  He has a 45 pack-year smoking history. He does not have any smokeless tobacco history on file. He reports that  drinks alcohol. He reports that he uses illicit drugs.  Additional Social History:  Alcohol / Drug Use Pain Medications: See meds PTA Prescriptions: Has  d/c meds from [facility] stay recently Over the Counter: N/A History of alcohol / drug use?: Yes Negative Consequences of Use: Financial Substance #2 Name of Substance 2: ETOH 2 - Age of First Use: Teens 2 - Amount (size/oz): Anywhere from a pint to a fifth 2 - Frequency: 1-2x/M 2 - Duration: Last two years 2 - Last Use / Amount: 05/15  One pint vodka  CIWA: CIWA-Ar BP: 107/71 mmHg Pulse Rate: 63 COWS:    Allergies:  Allergies  Allergen Reactions  . Asa (Aspirin) Other (See Comments)    High doses makes nose bleed    Home Medications:  (Not in a hospital admission)  OB/GYN Status:  No LMP for male patient.  General Assessment Data Location of Assessment: [facility] ED Living Arrangements: Non-relatives/Friends Can pt return to current living arrangement?: Yes Admission Status: Voluntary Is patient capable of signing voluntary admission?: Yes Transfer from: Acute Hospital Referral Source: Self/Family/Friend     Risk to self Suicidal Ideation: Yes-Currently Present Suicidal Intent: Yes-Currently Present Is patient at risk for suicide?: Yes Suicidal Plan?: Yes-Currently Present Specify Current Suicidal Plan: Shoot self, hang self Access to Means: Yes Specify Access to Suicidal Means: No gun but could get rope What has been your use of drugs/alcohol within the  last 12 months?: Drank PTA Previous Attempts/Gestures: Yes How many times?: 2 Other Self Harm Risks: N/a Triggers for Past Attempts: Other (Comment) (Pt his homeless and jobless) Intentional Self Injurious Behavior: None Family Suicide History: Yes (Mother had attempted suicide) Recent stressful life event(s): Job Loss;Financial Problems Persecutory voices/beliefs?: No Depression: Yes Depression Symptoms: Despondent;Insomnia;Isolating;Fatigue;Guilt;Loss of interest in usual pleasures;Feeling worthless/self pity Substance abuse history and/or treatment for substance abuse?: No Suicide prevention information given to  non-admitted patients: Not applicable  Risk to Others Homicidal Ideation: No Thoughts of Harm to Others: No Current Homicidal Intent: No Current Homicidal Plan: No Access to Homicidal Means: No Identified Victim: No one History of harm to others?: No Assessment of Violence: None Noted Violent Behavior Description: N/A Does patient have access to weapons?: No Criminal Charges Pending?: No Does patient have a court date: No  Psychosis Hallucinations: None noted Delusions: None noted  Mental Status Report Appear/Hygiene: Other (Comment) (Casual) Eye Contact: Fair Motor Activity: Freedom of movement;Unremarkable Speech: Logical/coherent Level of Consciousness: Quiet/awake Mood: Depressed;Empty Affect: Appropriate to circumstance Anxiety Level: None Thought Processes: Coherent;Relevant Judgement: Impaired Orientation: Place;Person;Situation;Time Obsessive Compulsive Thoughts/Behaviors: Minimal  Cognitive Functioning Concentration: Decreased Memory: Recent Intact;Remote Intact IQ: Average Insight: Fair Impulse Control: Fair Appetite: Poor Weight Loss: 0 Weight Gain: 0 Sleep: Decreased Total Hours of Sleep:  (<4H/D) Vegetative Symptoms: Staying in bed  ADLScreening [facility]) Patient's cognitive ability adequate to safely complete daily activities?: Yes Patient able to express need for assistance with ADLs?: Yes Independently performs ADLs?: Yes (appropriate for developmental age)  Abuse/Neglect [facility]) Physical Abuse: Denies Verbal Abuse: Denies Sexual Abuse: Denies  Prior Inpatient Therapy Prior Inpatient Therapy: Yes Prior Therapy Dates: 2014 Prior Therapy Facilty/Provider(s): [facility] Reason for Treatment: Depression, SI  Prior Outpatient Therapy Prior Outpatient Therapy: Yes Prior Therapy Dates: ongoing [facility] Reason for Treatment: MEd management  ADL Screening (condition at time of admission) Patient's  cognitive ability adequate to safely complete daily activities?: Yes Patient able to express need for assistance with ADLs?: Yes Independently performs ADLs?: Yes (appropriate for developmental age) Weakness of Legs: None Weakness of Arms/Hands: None       Abuse/Neglect Assessment (Assessment to be complete while patient is alone) Physical Abuse: Denies Verbal Abuse: Denies Sexual Abuse: Denies Exploitation of patient/patient's resources: Denies Self-Neglect: Denies     Merchant navy officer (For Healthcare) Advance Directive: Patient does not have advance directive;Patient would not like information    Additional Information 1:1 In Past 12 Months?: No CIRT Risk: No Elopement Risk: No Does patient have medical clearance?: Yes     Disposition:  Disposition Initial Assessment Completed for this Encounter: Yes Disposition of Patient: Inpatient treatment program;Referred to Patient referred to:  (Pt pending telepsych.  On-coming clinican to find placeemnt.)  On Site Evaluation by:   Reviewed with Physician:  Dr. Arty Baumgartner, Berna Spare Ray 04/22/2013 6:17 AM

## 2013-04-22 NOTE — ED Notes (Signed)
Per security officer Judie Petit the pt does not want to file a security report

## 2013-04-22 NOTE — ED Notes (Signed)
ACT team at bedside.  

## 2013-04-22 NOTE — ED Provider Notes (Signed)
History     CSN: [id number]  Arrival date & time 04/21/13  2309   First MD Initiated Contact with Patient 04/21/13 2344      Chief Complaint  Patient presents with  . 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 States he has been unable to f/u with neurologist and take meds. Pt admits to drinking alcohol tonight. Per EMS pt walked into bar, stated he felt like he was going to have a seizure. Lower himself to the ground and started shaking. He had no postictal period. No tongue biting or incontinence. Pt is currently asymptomatic but state he does not remember the event.  Past Medical History  Diagnosis Date  . Stroke   . Seizures   . Bipolar 1 disorder   . Depression     Past Surgical History  Procedure Laterality Date  . Hip surgery    . Ankle surgery    . Splenectomy      No family history on file.  History  Substance Use Topics  . Smoking status: Current Every Day Smoker -- 1.50 packs/day for 30 years    Types: Cigarettes  . Smokeless tobacco: Not on file  . Alcohol Use: Yes     Comment: after 03/16/2013 may drink one alcohol drink per month      Review of Systems  Constitutional: Negative for fever and chills.  HENT: Negative for neck pain.   Eyes: Negative for visual disturbance.  Respiratory: Negative for shortness of breath.   Cardiovascular: Negative for chest pain.  Gastrointestinal: Negative for nausea, vomiting and abdominal pain.  Musculoskeletal: Negative for myalgias and back pain.  Skin: Negative for pallor and rash.  Neurological: Positive for seizures and syncope. Negative for dizziness, weakness, light-headedness, numbness and headaches.  All other systems reviewed and are negative.    Allergies  Asa  Home Medications    Current Outpatient Rx  Name  Route  Sig  Dispense  Refill  . busPIRone (BUSPAR) 10 MG tablet   Oral   Take 1 tablet (10 mg total) by mouth 3 (three) times daily. For anxiety   90 tablet   0   . clonazePAM (KLONOPIN) 1 MG tablet   Oral   Take 1 tablet (1 mg total) by mouth 2 (two) times daily as needed (anxiety).   20 tablet   0   . gabapentin (NEURONTIN) 300 MG capsule   Oral   Take 2 capsules (600 mg total) by mouth 3 (three) times daily. For anxiety symptoms/pain control   90 capsule   0   . hydrOXYzine (ATARAX/VISTARIL) 25 MG tablet   Oral   Take 1 tablet (25 mg total) by mouth 2 (two) times daily as needed for anxiety.   60 tablet   0   . Multiple Vitamins-Minerals (MULTIVITAMINS THER. W/MINERALS) TABS   Oral   Take 1 tablet by mouth daily. For low vitamin   30 each      . QUEtiapine (SEROQUEL) 200 MG tablet   Oral   Take 1 tablet (200 mg total) by mouth at bedtime. For mood control   30 tablet   0   . sertraline (ZOLOFT) 100 MG tablet   Oral   Take 1 tablet (100 mg total) by mouth daily. For depression   30 tablet   0   .  traZODone (DESYREL) 50 MG tablet   Oral   Take 1 tablet (50 mg total) by mouth at bedtime as needed for sleep.   30 tablet   0     BP 107/71  Pulse 63  Temp(Src) 97.7 F (36.5 C) (Oral)  Resp 13  SpO2 98%  Physical Exam  Nursing note and vitals reviewed. Constitutional: He is oriented to person, place, and time. He appears well-developed and well-nourished. No distress.  HENT:  Head: Normocephalic.  Mouth/Throat: Oropharynx is clear and moist.  Small abrasion over R eyebrow  Eyes: EOM are normal. Pupils are equal, round, and reactive to light.  Neck: Normal range of motion. Neck supple.  No posterior cervical tenderness  Cardiovascular: Normal rate and regular rhythm.   Pulmonary/Chest: Effort normal and breath sounds normal. No respiratory distress. He has no wheezes. He has no rales.  Abdominal: Soft. Bowel sounds are  normal. He exhibits no distension and no mass. There is no tenderness. There is no rebound and no guarding.  Musculoskeletal: Normal range of motion. He exhibits no edema and no tenderness.  Neurological: He is alert and oriented to person, place, and time.  Smells of EtOH. 5/5 motor in all ext, sensation intact.   Skin: Skin is warm and dry. No rash noted. No erythema.  Psychiatric: He has a normal mood and affect. His behavior is normal.    ED Course  Procedures (including critical care time)  Labs Reviewed  CBC WITH DIFFERENTIAL - Abnormal; Notable for the following:    MCHC 36.1 (*)    All other components within normal limits  COMPREHENSIVE METABOLIC PANEL - Abnormal; Notable for the following:    Total Bilirubin 0.2 (*)    All other components within normal limits  ETHANOL - Abnormal; Notable for the following:    Alcohol, Ethyl (B) 293 (*)    All other components within normal limits  URINE RAPID DRUG SCREEN (HOSP PERFORMED) - Abnormal; Notable for the following:    Benzodiazepines POSITIVE (*)    All other components within normal limits  URINALYSIS, ROUTINE W REFLEX MICROSCOPIC   No results found.   1. Suicidal ideation   2. Alcohol intoxication       MDM   Pt states he has been suicidal lately and has been having thought of jumping off a bridge. Pt admits to attempted suicide in past. At this point pt is voluntary. He is medically cleared. And will have ACT eval.        Loren Racer, MD 04/22/13 [phone]

## 2013-04-22 NOTE — Progress Notes (Signed)
[facility] CM consulted for medication assistance and PCP issues by Cassandra SW in ED. MATCH program enrollment letter done. Went into speak with patient, patient left before receiving paper work and list of PCP accepting uninsured patient. ED RN stated patient left and stated, "I don't need anything from you".

## 2013-04-22 NOTE — Progress Notes (Signed)
General information Packet   Medicaid accepting [facility] Providers  [facility] [address]  [phone] 9am-5 pm Mon-Fri, 9 am -1pm on 2nd & 4th Saturdays Speak with Phylliss Blakes if you are self pay (no insurance coverage)   [facility] [address] [place] Suite [phone]  Dr Reginold Agent at [facility]  [address]  [place], [place], [address], [phone] [address]  [place] [phone] Dr Vedia Pereyra at [facility]   [address] [place], [place] [address] [phone]   Dr.  Maryelizabeth Rowan [facility] [address] [place] [address] [phone]  [facility]  [address] [phone] Mon-Fri 8-5, closed 12-1 for lunch   Renaye Rakers Only accepts [place] patients after they have Dr. Tedra Senegal name applied to the [facility] card [address] [place], [place] [address] [phone]  **Self-pay/Un-insured (patient that does not have insurance coverage)     [place] providers** Generally seen by below providers for discounted rate $45 +  [facility] opening in Spring of 2014 at [address] [place] [address] of the main [facility]. Until it opens, a temporary adult health center will be run at the [facility] Urgent [facility]. 10 a.m. to 7 p.m. Monday -Friday.  [facility] opened Monday, Feb. 3, 2014 will serve patients from birth to age 8, providing well visits and sick-child care-- children who are chronically ill, developmentally delayed or affected by mental health issues. --located in [address] of the [facility], [address] [place], [place]. Monday - Friday 8:30 am to 5:30 pm -Saturdays 9 a.m. to 1 p.m.  All previous health serve patients please call [phone] for 1) medical records 2)  to see if medications transferred to [facility] [phone]  or [address] [place] 3) get new Dr from this list of Doctors  Family Medicine at [employer] street [facility] community & homeless & walk-ins-triad adult & pediatric facility) -Opened January 10 2013 [address] [place], [place] [address] Phone: [phone] Fax: [phone] Hours: Monday -Friday /8 AM-5 PM Pharmacy Hours: Monday -Friday Silvio Pate - 4:30 PM Attending MD: Dr. Jeanella Flattery   [facility] [address] [place] [address] [phone] Hours Mon-Wed 8:30am-5pm & Thurs 8:30am-8pm $5 per visit/Call for an eligibility appointment  [facility] [address] [place], [place]  [phone] 9am-5 pm Mon-Fri, 9 am -1pm on 2nd & 4th Saturdays Speak with Phylliss Blakes if you are self -pay (no insurance coverage)  [facility] [address] [place], [place] [address] [phone] Dr Lerry Liner & [address] [place] Ph [phone] take most major insurance cards, Medicare and Medicaid. www.[email or web address] $45 per visit/Walk-in only. Offers a payment plan for Uninsured also.  Staff also speak [facility] Cares [address] [place] [address] Ph [phone] Every 2nd Saturday 9am-12pm [employer] FREE Services [facility] [address] [place] [address] [phone] [address] Laurell Josephs [address], [place]  [phone] Dr Jackie Plum Partnership for community care network assists with discounted doctors through "orange card services Call Ewell Poe at [phone]  [facility] Urgent care /Urgent Medical & Family Care: [address] [place], [place] [address] Phone: [phone]  [facility]  [address], [place]  [phone]    [address], [place]  [phone]  [facility] clinic [address] [place] [address] Clinic hours 1st & 3rd  Saturdays of every month 10-1 pm [phone] fax [phone] - No Case manager, Limited services [email or web address] Sliding fee scale/Call to make an appointment Red[facility] G[facility] [phone]  [facility] Urgent care-- [address] [place], [place] [phone] or [address], [place], [place], [address]   Sickle Cell patient can go to [facility] health sickle cell medical center Dr Willey Blade [facility] internal medicine [address] [place] [phone]   Health Connect [phone] or Physician Referral service [phone] choose option #2 or [phone] or [facility] hosp urgent care center [phone]  Inclusive Health Insurance [phone] Monday-Friday 8AM-5Pm EST or email [email or web address] created for [facility] with pre-existing conditions looking for more affordable health coverage. It includes two options, Inclusive Health - Geradine Girt Option is for people who have been without coverage for 6 months or more. Inclusive Health - State Option is for anyone else. Each of these options has its own eligibility criteria  Affordable Care Act Enrollment -call [phone], 24 hrs/7 days a week or go online at www.healthcare.gov or in person assisted appointment [phone]  [facility] Visits [address] [place], [place] [address] [phone]  DEPARTMENTS OF SOCIAL SERVICES/HEALTH DEPARTMENTS BY COUNTY [place]:  [place]: [phone] (main) [address] [place], [place] [address]    **Medicaid Transportation: 334-455-7220 or [phone] Child Protective Services: [phone] Adult Protective Services: [phone] APS and CPS after-hours: [phone] Domestic Violence Crisis Line: ([phone] or [phone] Crisis Intervention: 7033263113 or [phone] Food Stamps: 7476909551 or [phone] Services for the Blind: [phone] [place]: [phone] (main) [address] [place], [place] [address]  [facility] Department  (main): [phone] [address] or [address] [place]-STD testing [phone] or [phone] Mon-Friday  Maternity Care Coordination [phone] [place]): [phone] Child Service Coordination/Early Intervention Program/Heather Montez [place]: [phone] Maternity Program - Women's Health: [phone] WIC: [phone] [place] is very helpful.  I always ask for her)  **Medication assistance program at [facility] health dept [phone]   **COST EFFICIENT PLACES TO GET MEDICATIONS--Wal-mart, CVS, &Target have $4 generic medication programs- **Karin Golden offers Free 30 day generic antibiotics and diabetic medications --[facility] -Has Self pay applications also   **www.needymeds.org for drug patient assistance programs--Helpline calls dial [phone].  Family services of the [place] will fill [facility] patients medications + monarch [phone] + [facility] [phone]   Wal-mart has Wal-mart brand Insulin without a RX for $24.98 -- 70/30,  Relion Humulin R,  Humlin N Novolin/ReliOn Human Insulin Novolin/ReliOn is human insulin that works to lower your blood sugar (glucose). Novolin/ReliOn is manufactured for [employer] by [employer], the [employer]  in insulin production. The Novolin/ReliOn line includes the following types of insulin: . Novolin N (NPH human insulin [rDNA origin] isophane suspension) . Novolin R U-100 (regular insulin human injection, USP [rDNA origin]) . Novolin 70/30 (70% human insulin isophane suspension, 30% human insulin injection [rDNA origin]) Novolin/ReliOn Human Insulin - $24.88 Any change in insulin should be made cautiously and only under the supervision of a  doctor. Novolin is a Glass blower/designer. Insulin syringes $13.33 for box of 100 syringes  [facility] pharmacy -Offers Discounts -[place] [address] [place]ia Dolly [facility] [address] [place]  7:30 a.m. - 6:00 p.m.  Monday-Friday   Pharmacy that delivers depending on location [facility] [address] [place], [place] [address] Phone: [phone] Fax: [phone] [facility] [address], [place] [address] [phone] fax [phone] M-F 9a-7p Sat 10a-3 pm Sunday closed   HOUSING/SHELTERS  Housing Authorities: [place]: [phone]  FINANCIAL ASSISTANCE     Department of Social Services [phone] **[address] [place]: [phone], [address], [place], [place]    [facility] [place]: [phone], [address] [facility] [place]: [phone], [address], [place] [employer], [place]   [facility] [place]: [phone], [address] [place], [place], [place], [place]   [facility] [place]: [phone], [address] [place], [place]   [facility]: [phone], [address] [place], [place]    [facility] [place]: [phone], [address], [employer] [place]: [phone], [address], [place]   [facility] [place]: [phone], [address], Engineering geologist of [place] [phone]   [place] [employer] [address] [place], [place], [place]  provides financial assistance of up to $50.00 to help pay for prescription medications. [phone] Housing We don't want anyone to have to sleep on the streets.  The [employer] program can help you with referrals, information and education about: *Rental housing  *Transitional housing  *Residential treatment    *Emergency overnight shelter  *Tenant and landlord rights  If you are in immediate need of a bed in an emergency shelter please come to the [facility]- [address] [place], [place] [address] [phone] fax [phone]) by 2:00 pm Monday-Friday and sign in at the front desk.  Come if you need showers, laundry, computer access, job  counseling, resume help, referrals for food/clothing, Mental Health assistance and housing counseling Please note: the bed we secure for you may not be in [place]; we work with emergency shelters throughout the region. To make an appointment to talk with a counselor about long-term housing solutions please call [phone]- Quintella Reichert or Tobie Lords Homeless Resource [facility] Organizing for justice, equality, dignity, worth and the enormous potential of all people.[facility] [address] [place] [address] from 5AM to 8 am Monday, Tuesday, Thursday & Friday [facility] [address] [place], [place] [address] Contact: [phone] [facility] Emergency Assistance Program (EAP) provides food and financial aid to people in need in [place]. No referrals are needed for food or financial assistance. .Financial interviews available: Mon- Fri 9:00 A.M. - 4:00 P.M. For More Information, contact Emergency Assistance: [phone] When [facility] provides financial assistance the money will be paid directly to the provider of the service or need. A [employer] and/or Agreement must  be completed prior to services provided.  All applications and agreements for services will be made at [facility] at [address], [facility] at [employer], or [employer] at [address] / or email  [facility] & Liliane Shi Emergency (WE!) Contact: Front Desk [phone]  My Sister Birdie Hopes [place] is a home for pregnant teens and their babies. They provide 24/7 care and support to the participants in the program  Social Security Office for [place], [place] [address] Office Address: [address] [place], [place] [address] Phone (Local): [phone] Phone (Nat'l) : [phone] TTY : [phone]  Office Hours: MON: 09:00 AM - 03:00 PM; TUES: 09:00 AM - 03:00 PM;  WED: 09:00 AM - 12:00 PM; THUR: 09:00 AM - 03:00 PM; FRI: 09:00 AM - 03:00 PM; [facility] - [phone]- orange card accessible [address], [facility] Dental Services [phone] Medication Assistance Program (MAP) 801-453-8142 Pharmacy -[phone] Vital [phone] Women, Infants & Children [facility] [place]) - [phone] Dental Problems [facility] [place] [facility] -Dr. Maurice March  No longer accept Medicaid Does see all other insurance plans & self pay with payment up front [phone] [address] [phone] [address] --[phone] or [phone]  If unable to pay or uninsured, contact: [facility]. to become qualified for the adult dental clinic. [facility] and [facility] both have dental clinics completed by students [facility] [phone] ext [phone] --[facility] [phone] [address]e Hua & Nuala Alpha [address] [phone] approx $200 (paid up front) [facility] dental clinic [address] [place] [address] approx [phone]  DENTAL SERVICES UNINSURED/NO INSURANCE COVERAGE OR WITH INSURANCE hCALL [phone]

## 2013-04-22 NOTE — ED Provider Notes (Signed)
Patient currently sleeping, nurse's report no concerns overnight, tele-psychiatry consult pending, apparently after having a possible seizure in coming to the emergency department the patient stated he was suicidal and homeless. 0745  Telepsych recs discharge, Pt agrees, SW saw Pt, Telepsych recs discharge with one week Rx for Klonopin.  Hurman Horn, MD 05/02/13 [phone]

## 2013-04-22 NOTE — ED Notes (Signed)
Called telepsych.

## 2013-04-22 NOTE — ED Notes (Signed)
Pt states, "I had 4 gold dollars when I came in." pt informed that per the belonging inventory sheet there is no documentation of gold dollars, Charge RN informed, security informed, pt in with security for report to be made

## 2013-04-22 NOTE — ED Notes (Signed)
telepsych in progress. Dr Leretha Pol is psych

## 2013-04-25 NOTE — [facility] Suicide Risk Assessment (Signed)
[facility] INPATIENT:  Family/Significant Other Suicide Prevention Education  Suicide Prevention Education:  Patient Refusal for Family/Significant Other Suicide Prevention Education: The patient Nathaniel Warren has refused to provide written consent for family/significant other to be provided Family/Significant Other Suicide Prevention Education during admission and/or prior to discharge.  Physician notified. Writer provided suicide prevention education directly to patient; conversation included risk factors, warning signs and resources to contact for help. Mobile crisis services explained and contact card placed in chart for pt to receive at discharge.  Clide Dales 04/25/2013, 5:16 PM

## 2013-06-07 ENCOUNTER — Encounter (HOSPITAL_COMMUNITY): Payer: Self-pay | Admitting: *Deleted

## 2013-06-07 ENCOUNTER — Emergency Department (HOSPITAL_COMMUNITY): Payer: Self-pay

## 2013-06-07 ENCOUNTER — Emergency Department (HOSPITAL_COMMUNITY)
Admission: EM | Admit: 2013-06-07 | Discharge: 2013-06-07 | Disposition: A | Discharge status: Home / Self Care | Payer: Self-pay | Attending: Emergency Medicine | Admitting: Emergency Medicine

## 2013-06-07 DIAGNOSIS — G40909 Epilepsy, unspecified, not intractable, without status epilepticus: Secondary | ICD-10-CM | POA: Insufficient documentation

## 2013-06-07 DIAGNOSIS — F17200 Nicotine dependence, unspecified, uncomplicated: Secondary | ICD-10-CM | POA: Insufficient documentation

## 2013-06-07 DIAGNOSIS — J3489 Other specified disorders of nose and nasal sinuses: Secondary | ICD-10-CM | POA: Insufficient documentation

## 2013-06-07 DIAGNOSIS — K529 Noninfective gastroenteritis and colitis, unspecified: Secondary | ICD-10-CM

## 2013-06-07 DIAGNOSIS — K5289 Other specified noninfective gastroenteritis and colitis: Secondary | ICD-10-CM | POA: Insufficient documentation

## 2013-06-07 DIAGNOSIS — Z79899 Other long term (current) drug therapy: Secondary | ICD-10-CM | POA: Insufficient documentation

## 2013-06-07 DIAGNOSIS — Z8673 Personal history of transient ischemic attack (TIA), and cerebral infarction without residual deficits: Secondary | ICD-10-CM | POA: Insufficient documentation

## 2013-06-07 DIAGNOSIS — R1033 Periumbilical pain: Secondary | ICD-10-CM | POA: Insufficient documentation

## 2013-06-07 DIAGNOSIS — R197 Diarrhea, unspecified: Secondary | ICD-10-CM | POA: Insufficient documentation

## 2013-06-07 DIAGNOSIS — F319 Bipolar disorder, unspecified: Secondary | ICD-10-CM | POA: Insufficient documentation

## 2013-06-07 LAB — CBC WITH DIFFERENTIAL/PLATELET
Eosinophils Relative: 0 % (ref 0–5)
HCT: 49.1 % (ref 39.0–52.0)
Lymphocytes Relative: 16 % (ref 12–46)
Lymphs Abs: 1.6 10*3/uL (ref 0.7–4.0)
MCV: 95.3 fL (ref 78.0–100.0)
Monocytes Absolute: 0.8 10*3/uL (ref 0.1–1.0)
Monocytes Relative: 8 % (ref 3–12)
RBC: 5.15 MIL/uL (ref 4.22–5.81)
WBC: 9.6 10*3/uL (ref 4.0–10.5)

## 2013-06-07 LAB — URINALYSIS, ROUTINE W REFLEX MICROSCOPIC
Glucose, UA: NEGATIVE mg/dL
Hgb urine dipstick: NEGATIVE
Protein, ur: NEGATIVE mg/dL
Specific Gravity, Urine: 1.02 (ref 1.005–1.030)

## 2013-06-07 LAB — HEPATIC FUNCTION PANEL
ALT: 30 U/L (ref 0–53)
Alkaline Phosphatase: 115 U/L (ref 39–117)
Bilirubin, Direct: 0.2 mg/dL (ref 0.0–0.3)
Indirect Bilirubin: 0.7 mg/dL (ref 0.3–0.9)

## 2013-06-07 LAB — BASIC METABOLIC PANEL
CO2: 27 mEq/L (ref 19–32)
Calcium: 10.1 mg/dL (ref 8.4–10.5)
Creatinine, Ser: 0.79 mg/dL (ref 0.50–1.35)
Glucose, Bld: 101 mg/dL — ABNORMAL HIGH (ref 70–99)

## 2013-06-07 MED ORDER — HYDROMORPHONE HCL PF 1 MG/ML IJ SOLN
1.0000 mg | Freq: Once | INTRAMUSCULAR | Status: AC
  Administered 2013-06-07: 1 mg via INTRAVENOUS
  Filled 2013-06-07: qty 1

## 2013-06-07 MED ORDER — SODIUM CHLORIDE 0.9 % IV BOLUS (SEPSIS)
1000.0000 mL | Freq: Once | INTRAVENOUS | Status: AC
  Administered 2013-06-07: 1000 mL via INTRAVENOUS

## 2013-06-07 MED ORDER — ONDANSETRON HCL 4 MG/2ML IJ SOLN
4.0000 mg | Freq: Once | INTRAMUSCULAR | Status: AC
  Administered 2013-06-07: 4 mg via INTRAVENOUS
  Filled 2013-06-07: qty 2

## 2013-06-07 MED ORDER — LOPERAMIDE HCL 2 MG PO TABS: 2.0000 mg | ORAL_TABLET | Freq: Four times a day (QID) | ORAL | Status: DC | PRN

## 2013-06-07 MED ORDER — PROMETHAZINE HCL 25 MG PO TABS
25.0000 mg | ORAL_TABLET | Freq: Four times a day (QID) | ORAL | Status: DC | PRN
Start: 2013-06-07 — End: 2013-06-18

## 2013-06-07 MED ORDER — IOHEXOL 300 MG/ML  SOLN
100.0000 mL | Freq: Once | INTRAMUSCULAR | Status: AC | PRN
  Administered 2013-06-07: 100 mL via INTRAVENOUS

## 2013-06-07 MED ORDER — HYDROCODONE-ACETAMINOPHEN 5-325 MG PO TABS: 1.0000 | ORAL_TABLET | Freq: Four times a day (QID) | ORAL | Status: DC | PRN

## 2013-06-07 MED ORDER — SODIUM CHLORIDE 0.9 % IV SOLN
INTRAVENOUS | Status: DC
  Administered 2013-06-07: 19:00:00 via INTRAVENOUS

## 2013-06-07 MED ORDER — IOHEXOL 300 MG/ML  SOLN
50.0000 mL | Freq: Once | INTRAMUSCULAR | Status: AC | PRN
  Administered 2013-06-07: 50 mL via ORAL

## 2013-06-07 NOTE — ED Notes (Signed)
Patient unable to provide urine specimen at this time.  Patient does have an IV of normal saline plus drinking contrast.

## 2013-06-07 NOTE — ED Provider Notes (Signed)
History    CSN: [id number] Arrival date & time 06/07/13  1448  First MD Initiated Contact with Patient 06/07/13 1620     Chief Complaint  Patient presents with  . Abdominal Pain  . Nausea  . Emesis  . Nasal Congestion  . Diarrhea   (Consider location/radiation/quality/duration/timing/severity/associated sxs/prior Treatment) Patient is a 47 y.o. male presenting with abdominal pain, vomiting, and diarrhea. The history is provided by the patient.  Abdominal Pain This is a new problem. Associated symptoms include abdominal pain. Pertinent negatives include no chest pain, no headaches and no shortness of breath.  Emesis Associated symptoms: abdominal pain and diarrhea   Associated symptoms: no headaches   Diarrhea Associated symptoms: abdominal pain and vomiting   Associated symptoms: no fever and no headaches    patient with onset of nausea vomiting and diarrhea yesterday and periumbilical abdominal pain. The pain is intermittent and sharp in nature usually gets worse right before he vomits. No blood in the vomit. No other symptoms. Not made better or worse by anything. No history of similar pain. Past Medical History  Diagnosis Date  . Stroke   . Seizures   . Bipolar 1 disorder   . Depression    Past Surgical History  Procedure Laterality Date  . Hip surgery    . Ankle surgery    . Splenectomy     History reviewed. No pertinent family history. History  Substance Use Topics  . Smoking status: Current Every Day Smoker -- 1.50 packs/day for 30 years    Types: Cigarettes  . Smokeless tobacco: Not on file  . Alcohol Use: Yes     Comment: after 03/16/2013 may drink one alcohol drink per month    Review of Systems  Constitutional: Negative for fever.  Eyes: Negative for redness.  Respiratory: Negative for shortness of breath.   Cardiovascular: Negative for chest pain.  Gastrointestinal: Positive for nausea, vomiting, abdominal pain and diarrhea.  Genitourinary: Negative  for dysuria.  Musculoskeletal: Negative for back pain.  Skin: Negative for rash.  Neurological: Negative for headaches.  Hematological: Does not bruise/bleed easily.  Psychiatric/Behavioral: Negative for confusion.    Allergies  Asa  Home Medications   Current Outpatient Rx  Name  Route  Sig  Dispense  Refill  . busPIRone (BUSPAR) 10 MG tablet   Oral   Take 1 tablet (10 mg total) by mouth 3 (three) times daily. For anxiety   90 tablet   0   . clonazePAM (KLONOPIN) 1 MG tablet   Oral   Take 1 tablet (1 mg total) by mouth 2 (two) times daily as needed (anxiety).   20 tablet   0   . gabapentin (NEURONTIN) 300 MG capsule   Oral   Take 2 capsules (600 mg total) by mouth 3 (three) times daily. For anxiety symptoms/pain control   90 capsule   0   . hydrOXYzine (ATARAX/VISTARIL) 25 MG tablet   Oral   Take 1 tablet (25 mg total) by mouth 2 (two) times daily as needed for anxiety.   60 tablet   0   . QUEtiapine (SEROQUEL) 200 MG tablet   Oral   Take 1 tablet (200 mg total) by mouth at bedtime. For mood control   30 tablet   0   . sertraline (ZOLOFT) 100 MG tablet   Oral   Take 1 tablet (100 mg total) by mouth daily. For depression   30 tablet   0   . traZODone (DESYREL) 50  MG tablet   Oral   Take 1 tablet (50 mg total) by mouth at bedtime as needed for sleep.   30 tablet   0   . HYDROcodone-acetaminophen (NORCO/VICODIN) 5-325 MG per tablet   Oral   Take 1-2 tablets by mouth every 6 (six) hours as needed for pain.   10 tablet   0   . loperamide (IMODIUM A-D) 2 MG tablet   Oral   Take 1 tablet (2 mg total) by mouth 4 (four) times daily as needed for diarrhea or loose stools.   30 tablet   0   . promethazine (PHENERGAN) 25 MG tablet   Oral   Take 1 tablet (25 mg total) by mouth every 6 (six) hours as needed for nausea.   12 tablet   0    BP 140/81  Pulse 84  Temp(Src) 98.3 F (36.8 C) (Oral)  Resp 22  Ht 5' 9.5" (1.765 m)  Wt 165 lb (74.844 kg)   BMI 24.03 kg/m2  SpO2 97% Physical Exam  Nursing note and vitals reviewed. Constitutional: He is oriented to person, place, and time. He appears well-developed and well-nourished. No distress.  HENT:  Head: Normocephalic and atraumatic.  Mouth/Throat: Oropharynx is clear and moist.  Eyes: Conjunctivae and EOM are normal. Pupils are equal, round, and reactive to light.  Neck: Normal range of motion. Neck supple.  Cardiovascular: Normal rate, regular rhythm and normal heart sounds.   No murmur heard. Pulmonary/Chest: Effort normal and breath sounds normal.  Abdominal: Soft. Bowel sounds are normal. There is no tenderness.  Musculoskeletal: Normal range of motion. He exhibits no edema.  Neurological: He is alert and oriented to person, place, and time. No cranial nerve deficit. He exhibits normal muscle tone. Coordination normal.  Skin: Skin is warm. No rash noted. No erythema.    ED Course  Procedures (including critical care time) Labs Reviewed  CBC WITH DIFFERENTIAL - Abnormal; Notable for the following:    Hemoglobin 17.7 (*)    MCH 34.4 (*)    All other components within normal limits  BASIC METABOLIC PANEL - Abnormal; Notable for the following:    Glucose, Bld 101 (*)    All other components within normal limits  HEPATIC FUNCTION PANEL - Abnormal; Notable for the following:    AST 52 (*)    All other components within normal limits  LIPASE, BLOOD  URINALYSIS, ROUTINE W REFLEX MICROSCOPIC    Results for orders placed during the hospital encounter of 06/07/13  CBC WITH DIFFERENTIAL      Result Value Range   WBC 9.6  4.0 - 10.5 K/uL   RBC 5.15  4.22 - 5.81 MIL/uL   Hemoglobin 17.7 (*) 13.0 - 17.0 g/dL   HCT 16.1  09.6 - 04.5 %   MCV 95.3  78.0 - 100.0 fL   MCH 34.4 (*) 26.0 - 34.0 pg   MCHC 36.0  30.0 - 36.0 g/dL   RDW 40.9  81.1 - 91.4 %   Platelets 158  150 - 400 K/uL   Neutrophils Relative % 75  43 - 77 %   Neutro Abs 7.2  1.7 - 7.7 K/uL   Lymphocytes Relative 16   12 - 46 %   Lymphs Abs 1.6  0.7 - 4.0 K/uL   Monocytes Relative 8  3 - 12 %   Monocytes Absolute 0.8  0.1 - 1.0 K/uL   Eosinophils Relative 0  0 - 5 %   Eosinophils Absolute 0.0  0.0 - 0.7 K/uL   Basophils Relative 0  0 - 1 %   Basophils Absolute 0.0  0.0 - 0.1 K/uL  BASIC METABOLIC PANEL      Result Value Range   Sodium 138  135 - 145 mEq/L   Potassium 4.0  3.5 - 5.1 mEq/L   Chloride 96  96 - 112 mEq/L   CO2 27  19 - 32 mEq/L   Glucose, Bld 101 (*) 70 - 99 mg/dL   BUN 13  6 - 23 mg/dL   Creatinine, Ser 1.61  0.50 - 1.35 mg/dL   Calcium 09.6  8.4 - 04.5 mg/dL   GFR calc non Af Amer >90  >90 mL/min   GFR calc Af Amer >90  >90 mL/min  LIPASE, BLOOD      Result Value Range   Lipase 39  11 - 59 U/L  HEPATIC FUNCTION PANEL      Result Value Range   Total Protein 8.3  6.0 - 8.3 g/dL   Albumin 4.5  3.5 - 5.2 g/dL   AST 52 (*) 0 - 37 U/L   ALT 30  0 - 53 U/L   Alkaline Phosphatase 115  39 - 117 U/L   Total Bilirubin 0.9  0.3 - 1.2 mg/dL   Bilirubin, Direct 0.2  0.0 - 0.3 mg/dL   Indirect Bilirubin 0.7  0.3 - 0.9 mg/dL      Ct Abdomen Pelvis W Contrast  06/07/2013   *RADIOLOGY REPORT*  Clinical Data: Abdominal pain and nausea.  Umbilical hernia.  CT ABDOMEN AND PELVIS WITH CONTRAST  Technique:  Multidetector CT imaging of the abdomen and pelvis was performed following the standard protocol during bolus administration of intravenous contrast.  Contrast: 50mL OMNIPAQUE IOHEXOL 300 MG/ML  SOLN, OMNIPAQUE IOHEXOL 300 MG/ML  SOLN  Comparison: 02/26/2011.  Findings: Again demonstrated is diffuse low density of the liver. 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  Small supraumbilical ventral hernia containing fat.  Unremarkable pancreas, gallbladder, adrenal glands, kidneys, urinary bladder and prostate gland.  Multiple small colonic diverticula without evidence of diverticulitis.  No evidence of appendicitis.  No dilated bowel loops or free peritoneal air.   No enlarged lymph nodes.  Mild atheromatous arterial calcification. Minimal dependent atelectasis at the left lung base.  Lumbar and lower thoracic spine degenerative changes.  IMPRESSION:  1.  No acute abnormality. 2.  Stable hepatic steatosis and post splenectomy changes. 3.  Small supraumbilical hernia containing fat. 4.  Mild colonic diverticulosis.   Original Report Authenticated By: Beckie Salts, M.D.   1. Gastroenteritis     MDM  Symptoms most likely consistent with gastroenteritis. CT scan workup without evidence of any significant abnormalities. No evidence of pancreatitis no liver function test abnormalities other than some mild elevation in the AST. Patient improves some in the emergency department. Hydrated well with IV fluids. Discharge home with Phenergan Imodium and some pain medicine. Patient will return for a newer worse symptoms.  Shelda Jakes, MD 06/07/13 [phone]

## 2013-06-07 NOTE — ED Notes (Signed)
Pt c/o abdominal pain, nasal congestion and n/v/d since yesterday. Unable to keep anything down.

## 2013-06-07 NOTE — ED Notes (Signed)
Discharge instructions given and reviewed with patient.  Prescriptions given and reviewed with patient; effects and use explained.  Patient verbalized understanding of sedating effects of medications.  Patient discharged home in good condition.

## 2013-06-08 ENCOUNTER — Emergency Department (HOSPITAL_COMMUNITY)
Admission: EM | Admit: 2013-06-08 | Discharge: 2013-06-09 | Disposition: A | Discharge status: Home / Self Care | Payer: Self-pay | Attending: Emergency Medicine | Admitting: Emergency Medicine

## 2013-06-08 ENCOUNTER — Encounter (HOSPITAL_COMMUNITY): Payer: Self-pay | Admitting: *Deleted

## 2013-06-08 DIAGNOSIS — Z8673 Personal history of transient ischemic attack (TIA), and cerebral infarction without residual deficits: Secondary | ICD-10-CM | POA: Insufficient documentation

## 2013-06-08 DIAGNOSIS — G40909 Epilepsy, unspecified, not intractable, without status epilepticus: Secondary | ICD-10-CM | POA: Insufficient documentation

## 2013-06-08 DIAGNOSIS — F319 Bipolar disorder, unspecified: Secondary | ICD-10-CM | POA: Insufficient documentation

## 2013-06-08 DIAGNOSIS — F17200 Nicotine dependence, unspecified, uncomplicated: Secondary | ICD-10-CM | POA: Insufficient documentation

## 2013-06-08 DIAGNOSIS — R197 Diarrhea, unspecified: Secondary | ICD-10-CM | POA: Insufficient documentation

## 2013-06-08 DIAGNOSIS — R109 Unspecified abdominal pain: Secondary | ICD-10-CM | POA: Insufficient documentation

## 2013-06-08 DIAGNOSIS — R5383 Other fatigue: Secondary | ICD-10-CM | POA: Insufficient documentation

## 2013-06-08 DIAGNOSIS — Z79899 Other long term (current) drug therapy: Secondary | ICD-10-CM | POA: Insufficient documentation

## 2013-06-08 DIAGNOSIS — K5289 Other specified noninfective gastroenteritis and colitis: Secondary | ICD-10-CM | POA: Insufficient documentation

## 2013-06-08 DIAGNOSIS — R5381 Other malaise: Secondary | ICD-10-CM | POA: Insufficient documentation

## 2013-06-08 MED ORDER — ONDANSETRON 8 MG PO TBDP
8.0000 mg | ORAL_TABLET | Freq: Once | ORAL | Status: AC
  Administered 2013-06-08: 8 mg via ORAL
  Filled 2013-06-08: qty 1

## 2013-06-08 NOTE — ED Notes (Addendum)
abd pain, NVD since Monday,  Seen here 7/1 and dx gastroenteritis  Had rx , but did not have money to get them filled.
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
## 2013-06-09 LAB — CBC WITH DIFFERENTIAL/PLATELET
Basophils Absolute: 0 10*3/uL (ref 0.0–0.1)
Basophils Relative: 0 % (ref 0–1)
Eosinophils Absolute: 0.1 10*3/uL (ref 0.0–0.7)
HCT: 46 % (ref 39.0–52.0)
MCH: 33.8 pg (ref 26.0–34.0)
MCHC: 35.7 g/dL (ref 30.0–36.0)
Monocytes Absolute: 0.9 10*3/uL (ref 0.1–1.0)
Neutro Abs: 6.3 10*3/uL (ref 1.7–7.7)
RDW: 15.2 % (ref 11.5–15.5)

## 2013-06-09 LAB — LACTIC ACID, PLASMA: Lactic Acid, Venous: 0.9 mmol/L (ref 0.5–2.2)

## 2013-06-09 LAB — BASIC METABOLIC PANEL
Calcium: 9.6 mg/dL (ref 8.4–10.5)
Chloride: 98 mEq/L (ref 96–112)
Creatinine, Ser: 0.94 mg/dL (ref 0.50–1.35)
GFR calc Af Amer: >90 mL/min (ref >90)
GFR calc non Af Amer: >90 mL/min (ref >90)

## 2013-06-18 ENCOUNTER — Encounter (HOSPITAL_COMMUNITY): Payer: Self-pay

## 2013-06-18 ENCOUNTER — Emergency Department (HOSPITAL_COMMUNITY)
Admission: EM | Admit: 2013-06-18 | Discharge: 2013-06-19 | Disposition: A | Discharge status: Other Acute Inpatient Hospital | Payer: Self-pay | Attending: Emergency Medicine | Admitting: Emergency Medicine

## 2013-06-18 DIAGNOSIS — F319 Bipolar disorder, unspecified: Secondary | ICD-10-CM | POA: Insufficient documentation

## 2013-06-18 DIAGNOSIS — F17200 Nicotine dependence, unspecified, uncomplicated: Secondary | ICD-10-CM | POA: Insufficient documentation

## 2013-06-18 DIAGNOSIS — G40909 Epilepsy, unspecified, not intractable, without status epilepticus: Secondary | ICD-10-CM | POA: Insufficient documentation

## 2013-06-18 DIAGNOSIS — T43502A Poisoning by unspecified antipsychotics and neuroleptics, intentional self-harm, initial encounter: Secondary | ICD-10-CM | POA: Insufficient documentation

## 2013-06-18 DIAGNOSIS — Z79899 Other long term (current) drug therapy: Secondary | ICD-10-CM | POA: Insufficient documentation

## 2013-06-18 DIAGNOSIS — T43591A Poisoning by other antipsychotics and neuroleptics, accidental (unintentional), initial encounter: Secondary | ICD-10-CM | POA: Insufficient documentation

## 2013-06-18 DIAGNOSIS — Z8673 Personal history of transient ischemic attack (TIA), and cerebral infarction without residual deficits: Secondary | ICD-10-CM | POA: Insufficient documentation

## 2013-06-18 DIAGNOSIS — F329 Major depressive disorder, single episode, unspecified: Secondary | ICD-10-CM

## 2013-06-18 LAB — SALICYLATE LEVEL: Salicylate Lvl: <2 mg/dL — ABNORMAL LOW (ref 2.8–20.0)

## 2013-06-18 LAB — COMPREHENSIVE METABOLIC PANEL
ALT: 29 U/L (ref 0–53)
Alkaline Phosphatase: 92 U/L (ref 39–117)
BUN: 8 mg/dL (ref 6–23)
Chloride: 104 mEq/L (ref 96–112)
GFR calc Af Amer: >90 mL/min (ref >90)
Glucose, Bld: 106 mg/dL — ABNORMAL HIGH (ref 70–99)
Potassium: 3.6 mEq/L (ref 3.5–5.1)
Sodium: 141 mEq/L (ref 135–145)
Total Bilirubin: 0.5 mg/dL (ref 0.3–1.2)

## 2013-06-18 LAB — ACETAMINOPHEN LEVEL
Acetaminophen (Tylenol), Serum: <15 ug/mL (ref 10–30)
Acetaminophen (Tylenol), Serum: <15 ug/mL (ref 10–30)

## 2013-06-18 LAB — CBC WITH DIFFERENTIAL/PLATELET
Basophils Relative: 0 % (ref 0–1)
Eosinophils Absolute: 0.1 10*3/uL (ref 0.0–0.7)
Hemoglobin: 16.2 g/dL (ref 13.0–17.0)
MCH: 34.5 pg — ABNORMAL HIGH (ref 26.0–34.0)
MCHC: 36.1 g/dL — ABNORMAL HIGH (ref 30.0–36.0)
Monocytes Relative: 10 % (ref 3–12)
Neutro Abs: 4.4 10*3/uL (ref 1.7–7.7)
Neutrophils Relative %: 58 % (ref 43–77)
Platelets: 208 10*3/uL (ref 150–400)
RBC: 4.7 MIL/uL (ref 4.22–5.81)

## 2013-06-18 LAB — RAPID URINE DRUG SCREEN, HOSP PERFORMED
Barbiturates: NOT DETECTED
Benzodiazepines: NOT DETECTED
Tetrahydrocannabinol: POSITIVE — AB

## 2013-06-18 NOTE — ED Notes (Signed)
Gina from poison control called for update on pt.

## 2013-06-18 NOTE — [facility] Assessment (Signed)
[facility] Assessment Progress Note      Patient was accepted by Vernona Rieger, NP to Dr. Dub Mikes; Room 304-Bed 2. Patient is voluntary. Contacted [facility] and completed authorization process: spoke with Loraine Leriche, gave clinical information and demographics. Authorization received for three days, authorization number [id number].  Shon Baton, MSW, LCSW, LCASA, CSW-G

## 2013-06-18 NOTE — ED Notes (Signed)
Poison control contacted and advised to provide supportive care obtain acetaminophen level 4 hrs after ingestion of medication. Observe patient for 6 hrs and if no symptoms can be medically cleared.

## 2013-06-18 NOTE — Progress Notes (Signed)
Patient has been accepted at [facility] by Fransisca Kaufmann NP to the services of dr. Dub Mikes, room 304.2

## 2013-06-18 NOTE — BH Assessment (Signed)
Assessment Note   Nathaniel Warren is an 47 y.o. male. He presents voluntarily to the emergency department after he took a few buspar and vistaril pills; stating he just put them in his hand and took them with a 40 ounce beer. He states he has had depression for several years now and that it hasn't gotten much better. He has been taking his vistaril and buspar as directed, but has run out of his neurotin and klonopin about two weeks ago. He states he gets his medications from a PAP at [facility]. He states that he isn't working and is not getting disability; he states he wants to work but because of his seizures, he is not able to drive and this limits him to be able to work. He has not filed for disability and we did discuss the process for this during the assessment; this would benefit him for income and insurance. He describes feelings of hopelessness and helplessness. He just moved to [facility] 2 weeks ago because he wasn't able to get along with his daughter's boyfriend in the apartment they all resided in. He states that he isn't sleeping much; waking up frequently and not staying asleep for more than 2 hours or so. He has no appetite and reports he has lost 10 lbs in the last month. He states he is tired of feeling like this and just wants to end it. After he took the pills, his friend convinced him to come to the hospital to seek help. He does not have any delusions or hallucinations. No homicidal ideation. He drinks alcohol, two times per week; between two and three beers at most. Today, he consumed a 40 ounce beer. He denies other drugs of abuse. He has a valid prescription for Vicodin, but states that if the inpatient facility states he can't take it, he is fine with that and will take advil or aleve. He continues to have thoughts of wanting to harm himself and because he did seek an attempt today, it is recommended he seek inpatient treatment to stabilize his condition and re-assess his medication  administration.  He is willing at this time to voluntarily sign himself into a facility. He would like to return to [facility], if this is a possibility. He is appropriate and quiet.   Axis I: Major Depression, Recurrent severe; alcohol abuse Axis II: Deferred Axis III: Past history of a stroke (stable); History of Seizures with his last seizure being 2.5 months ago; Splenectomy Axis IV: Financial issue; which complicate his relationships and intensify his depression; lacks transportation and family support Axis V: GAF 15-25  LOCUS 32  Past Medical History:  Past Medical History  Diagnosis Date  . Stroke   . Seizures   . Bipolar 1 disorder   . Depression     Past Surgical History  Procedure Laterality Date  . Ankle surgery    . Splenectomy      Family History: No family history on file.  Social History:  reports that he has been smoking Cigarettes.  He has a 45 pack-year smoking history. He does not have any smokeless tobacco history on file. He reports that  drinks alcohol. He reports that he does not use illicit drugs.  Additional Social History:     CIWA: CIWA-Ar BP: 117/64 mmHg Pulse Rate: 79 COWS:    Allergies:  Allergies  Allergen Reactions  . Asa (Aspirin) Other (See Comments)    High doses makes nose bleed  Home Medications:  (Not in a hospital admission)  OB/GYN Status:  No LMP for male patient.  General Assessment Data Location of Assessment: [facility] ACT Assessment: Yes Living Arrangements: Non-relatives/Friends Can pt return to current living arrangement?: Yes Admission Status: Voluntary Is patient capable of signing voluntary admission?: Yes Transfer from: Acute Hospital Referral Source: Self/Family/Friend  Education Status Is patient currently in school?: No  Risk to self Suicidal Ideation: Yes-Currently Present Suicidal Intent: Yes-Currently Present Is patient at risk for suicide?: Yes Suicidal Plan?: Yes-Currently  Present Specify Current Suicidal Plan:  (has attempted to OD today handful of pills) Access to Means: Yes Specify Access to Suicidal Means:  (pills) What has been your use of drugs/alcohol within the last 12 months?:  (etoh) Previous Attempts/Gestures: Yes How many times?:  (2) Other Self Harm Risks:  (none noted) Triggers for Past Attempts: Other (Comment) (frustrated with medical situation) Intentional Self Injurious Behavior: None Family Suicide History: Yes Recent stressful life event(s): Conflict (Comment);Financial Problems;Recent negative physical changes Persecutory voices/beliefs?: No Depression: Yes Depression Symptoms: Despondent;Loss of interest in usual pleasures Substance abuse history and/or treatment for substance abuse?: Yes Suicide prevention information given to non-admitted patients: Not applicable  Risk to Others Homicidal Ideation: No Thoughts of Harm to Others: No Current Homicidal Intent: No Current Homicidal Plan: No Access to Homicidal Means: No Identified Victim:  (none) History of harm to others?: No Assessment of Violence: None Noted Violent Behavior Description:  (n/a) Does patient have access to weapons?: No Criminal Charges Pending?: No Does patient have a court date: No  Psychosis Hallucinations: None noted Delusions: None noted  Mental Status Report Appear/Hygiene: Improved Eye Contact: Fair Motor Activity: Unremarkable Speech: Logical/coherent Level of Consciousness: Alert;Quiet/awake Mood: Depressed;Sad;Sullen;Worthless, low self-esteem Affect: Blunted;Depressed Anxiety Level: None Thought Processes: Coherent Judgement: Impaired Orientation: Person;Place;Time;Situation;Appropriate for developmental age Obsessive Compulsive Thoughts/Behaviors: Minimal  Cognitive Functioning Concentration: Decreased Memory: Recent Intact;Remote Intact IQ: Average Insight: Fair Impulse Control: Poor Appetite: Poor Weight Loss:  (10 ) Weight  Gain: 0 Sleep: Decreased Total Hours of Sleep:  (4) Vegetative Symptoms: Staying in bed  ADLScreening [facility] Assessment Services) Patient's cognitive ability adequate to safely complete daily activities?: Yes Patient able to express need for assistance with ADLs?: Yes Independently performs ADLs?: Yes (appropriate for developmental age)  Abuse/Neglect [facility]) Physical Abuse: Denies Verbal Abuse: Denies Sexual Abuse: Denies  Prior Inpatient Therapy Prior Inpatient Therapy: Yes Prior Therapy Dates:  (04/2013) Prior Therapy Facilty/Provider(s):  [facility]) Reason for Treatment:  (depression)  Prior Outpatient Therapy Prior Outpatient Therapy: Yes Prior Therapy Facilty/Provider(s): [facility] Reason for Treatment: med management  ADL Screening (condition at time of admission) Patient's cognitive ability adequate to safely complete daily activities?: Yes Patient able to express need for assistance with ADLs?: Yes Independently performs ADLs?: Yes (appropriate for developmental age)       Abuse/Neglect Assessment (Assessment to be complete while patient is alone) Physical Abuse: Denies Verbal Abuse: Denies Sexual Abuse: Denies Values / Beliefs Cultural Requests During Hospitalization: None Spiritual Requests During Hospitalization: None        Additional Information 1:1 In Past 12 Months?: No CIRT Risk: No Elopement Risk: No Does patient have medical clearance?: Yes     Disposition:  Disposition Initial Assessment Completed for this Encounter: Yes Disposition of Patient: Inpatient treatment program Type of inpatient treatment program: Adult  On Site Evaluation by:  Dr. Donnetta Hutching Reviewed with Physician:  Dr. Donnetta Hutching  Patient will be referred to [facility], [place], [facility], [facility],  etc. Until he is accepted for inpatient stabilization.    Shon Baton H 06/18/2013 8:57 PM
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
## 2013-06-18 NOTE — ED Notes (Signed)
Pt says " I just want to die."  Pt reports approx 1 1/2h ago took 2 handfuls of vistaril and buspar.  Vistaril was 25mg  tabs, buspar was 10mg  tabs.Pt says a friend talked him into coming to ed.   Pt says that nothing in particular triggered his SI.

## 2013-06-19 ENCOUNTER — Encounter (HOSPITAL_COMMUNITY): Payer: Self-pay | Admitting: *Deleted

## 2013-06-19 ENCOUNTER — Inpatient Hospital Stay (HOSPITAL_COMMUNITY)
Admission: AD | Admit: 2013-06-19 | Discharge: 2013-06-23 | DRG: 885 | Disposition: A | Discharge status: Home / Self Care | Payer: 59 | Source: Intra-hospital | Attending: Psychiatry | Admitting: Psychiatry

## 2013-06-19 DIAGNOSIS — F419 Anxiety disorder, unspecified: Secondary | ICD-10-CM

## 2013-06-19 DIAGNOSIS — Z79899 Other long term (current) drug therapy: Secondary | ICD-10-CM

## 2013-06-19 DIAGNOSIS — Z8673 Personal history of transient ischemic attack (TIA), and cerebral infarction without residual deficits: Secondary | ICD-10-CM

## 2013-06-19 DIAGNOSIS — F17200 Nicotine dependence, unspecified, uncomplicated: Secondary | ICD-10-CM | POA: Diagnosis present

## 2013-06-19 DIAGNOSIS — F1020 Alcohol dependence, uncomplicated: Secondary | ICD-10-CM

## 2013-06-19 DIAGNOSIS — R45851 Suicidal ideations: Secondary | ICD-10-CM

## 2013-06-19 DIAGNOSIS — F332 Major depressive disorder, recurrent severe without psychotic features: Principal | ICD-10-CM | POA: Diagnosis present

## 2013-06-19 DIAGNOSIS — F411 Generalized anxiety disorder: Secondary | ICD-10-CM | POA: Diagnosis present

## 2013-06-19 MED ORDER — SERTRALINE HCL 100 MG PO TABS
100.0000 mg | ORAL_TABLET | Freq: Every day | ORAL | Status: DC
  Administered 2013-06-19 – 2013-06-20 (×2): 100 mg via ORAL
  Filled 2013-06-19 (×5): qty 1

## 2013-06-19 MED ORDER — VITAMIN B-1 100 MG PO TABS
100.0000 mg | ORAL_TABLET | Freq: Every day | ORAL | Status: DC
  Administered 2013-06-20 – 2013-06-23 (×4): 100 mg via ORAL
  Filled 2013-06-19 (×6): qty 1

## 2013-06-19 MED ORDER — TRAZODONE HCL 50 MG PO TABS
50.0000 mg | ORAL_TABLET | Freq: Every evening | ORAL | Status: DC | PRN
  Administered 2013-06-19 – 2013-06-22 (×4): 50 mg via ORAL
  Filled 2013-06-19: qty 14
  Filled 2013-06-19: qty 1

## 2013-06-19 MED ORDER — CHLORDIAZEPOXIDE HCL 25 MG PO CAPS
25.0000 mg | ORAL_CAPSULE | Freq: Three times a day (TID) | ORAL | Status: AC
  Administered 2013-06-20 (×3): 25 mg via ORAL
  Filled 2013-06-19 (×3): qty 1

## 2013-06-19 MED ORDER — GABAPENTIN 300 MG PO CAPS
600.0000 mg | ORAL_CAPSULE | Freq: Three times a day (TID) | ORAL | Status: DC
  Administered 2013-06-19 – 2013-06-23 (×13): 600 mg via ORAL
  Filled 2013-06-19: qty 84
  Filled 2013-06-19: qty 2
  Filled 2013-06-19: qty 1
  Filled 2013-06-19: qty 2
  Filled 2013-06-19: qty 84
  Filled 2013-06-19 (×14): qty 2
  Filled 2013-06-19: qty 84
  Filled 2013-06-19: qty 2
  Filled 2013-06-19: qty 1
  Filled 2013-06-19: qty 2

## 2013-06-19 MED ORDER — QUETIAPINE FUMARATE 200 MG PO TABS
200.0000 mg | ORAL_TABLET | Freq: Every day | ORAL | Status: DC
  Administered 2013-06-19 – 2013-06-22 (×4): 200 mg via ORAL
  Filled 2013-06-19: qty 14
  Filled 2013-06-19 (×7): qty 1

## 2013-06-19 MED ORDER — CHLORDIAZEPOXIDE HCL 25 MG PO CAPS
25.0000 mg | ORAL_CAPSULE | Freq: Four times a day (QID) | ORAL | Status: AC
  Administered 2013-06-19 (×3): 25 mg via ORAL
  Filled 2013-06-19 (×4): qty 1

## 2013-06-19 MED ORDER — BUSPIRONE HCL 10 MG PO TABS
10.0000 mg | ORAL_TABLET | Freq: Three times a day (TID) | ORAL | Status: DC
  Administered 2013-06-19 – 2013-06-23 (×13): 10 mg via ORAL
  Filled 2013-06-19: qty 42
  Filled 2013-06-19 (×5): qty 1
  Filled 2013-06-19: qty 42
  Filled 2013-06-19: qty 1
  Filled 2013-06-19: qty 42
  Filled 2013-06-19 (×13): qty 1

## 2013-06-19 MED ORDER — CHLORDIAZEPOXIDE HCL 25 MG PO CAPS
25.0000 mg | ORAL_CAPSULE | ORAL | Status: AC
  Administered 2013-06-21 (×2): 25 mg via ORAL
  Filled 2013-06-19: qty 1

## 2013-06-19 MED ORDER — ACETAMINOPHEN 325 MG PO TABS
650.0000 mg | ORAL_TABLET | Freq: Four times a day (QID) | ORAL | Status: DC | PRN
  Administered 2013-06-20: 650 mg via ORAL

## 2013-06-19 MED ORDER — TRAZODONE HCL 50 MG PO TABS
50.0000 mg | ORAL_TABLET | Freq: Every evening | ORAL | Status: DC | PRN
  Administered 2013-06-21: 50 mg via ORAL
  Filled 2013-06-19 (×5): qty 1

## 2013-06-19 MED ORDER — LOPERAMIDE HCL 2 MG PO CAPS: 2.0000 mg | ORAL_CAPSULE | ORAL | Status: AC | PRN

## 2013-06-19 MED ORDER — THIAMINE HCL 100 MG/ML IJ SOLN: 100.0000 mg | Freq: Once | INTRAMUSCULAR | Status: DC

## 2013-06-19 MED ORDER — ALUM & MAG HYDROXIDE-SIMETH 200-200-20 MG/5ML PO SUSP: 30.0000 mL | ORAL | Status: DC | PRN

## 2013-06-19 MED ORDER — CHLORDIAZEPOXIDE HCL 25 MG PO CAPS
25.0000 mg | ORAL_CAPSULE | Freq: Every day | ORAL | Status: AC
  Administered 2013-06-22: 25 mg via ORAL
  Filled 2013-06-19 (×2): qty 1

## 2013-06-19 MED ORDER — CHLORDIAZEPOXIDE HCL 25 MG PO CAPS: 25.0000 mg | ORAL_CAPSULE | Freq: Once | ORAL | Status: DC

## 2013-06-19 MED ORDER — ONDANSETRON 4 MG PO TBDP: 4.0000 mg | ORAL_TABLET | Freq: Four times a day (QID) | ORAL | Status: AC | PRN

## 2013-06-19 MED ORDER — HYDROXYZINE HCL 25 MG PO TABS
25.0000 mg | ORAL_TABLET | Freq: Four times a day (QID) | ORAL | Status: DC | PRN
  Administered 2013-06-20 – 2013-06-21 (×2): 25 mg via ORAL

## 2013-06-19 MED ORDER — MAGNESIUM HYDROXIDE 400 MG/5ML PO SUSP: 30.0000 mL | Freq: Every day | ORAL | Status: DC | PRN

## 2013-06-19 MED ORDER — CHLORDIAZEPOXIDE HCL 25 MG PO CAPS
25.0000 mg | ORAL_CAPSULE | Freq: Four times a day (QID) | ORAL | Status: AC | PRN
  Administered 2013-06-20: 25 mg via ORAL
  Filled 2013-06-19: qty 1

## 2013-06-19 MED ORDER — ADULT MULTIVITAMIN W/MINERALS CH
1.0000 | ORAL_TABLET | Freq: Every day | ORAL | Status: DC
  Administered 2013-06-19 – 2013-06-23 (×5): 1 via ORAL
  Filled 2013-06-19 (×7): qty 1

## 2013-06-19 MED ORDER — NICOTINE 21 MG/24HR TD PT24
21.0000 mg | MEDICATED_PATCH | Freq: Every day | TRANSDERMAL | Status: DC
  Administered 2013-06-19 – 2013-06-23 (×5): 21 mg via TRANSDERMAL
  Filled 2013-06-19 (×8): qty 1

## 2013-06-19 NOTE — Progress Notes (Signed)
Patient ID: Nathaniel Warren, male   DOB: [birth year], 47 y.o.   MRN: [id number] Nursing admit note- Nathaniel Warren is a 47y/o w/m admitted voluntarily following medical clearance from [facility] with chief c/o increasing depression and SI with plan to OD.  States he was here 2 months prior and feels that medications that [facility] have him on  "are not working".  Reports decreased appetite, racing thoughts, and sadness.  Denies SA even though UDS was positive for etoh(211),THC,and opiates.  Requested to program on [address].  Passive SI and contracts for safety.  Is pleasant and cooperative.  Search and orientation complete.  POC and 15' checks initiated. Safety maintained.

## 2013-06-19 NOTE — ED Notes (Signed)
Pt ambulatory to the bathroom, sitter assisted pt.

## 2013-06-19 NOTE — Tx Team (Signed)
Initial Interdisciplinary Treatment Plan  PATIENT STRENGTHS: (choose at least two) Ability for insight Average or above average intelligence Capable of independent living Communication skills General fund of knowledge  PATIENT STRESSORS: Financial difficulties Marital or family conflict Medication change or noncompliance Substance abuse   PROBLEM LIST: Problem List/Patient Goals Date to be addressed Date deferred Reason deferred Estimated date of resolution  Depression 06-19-13   D/c        Substance abuse 06-19-13   D/c        Occupational challenges 06-19-13   D/c                           DISCHARGE CRITERIA:  Ability to meet basic life and health needs Adequate post-discharge living arrangements Improved stabilization in mood, thinking, and/or behavior Motivation to continue treatment in a less acute level of care Reduction of life-threatening or endangering symptoms to within safe limits Verbal commitment to aftercare and medication compliance Withdrawal symptoms are absent or subacute and managed without 24-hour nursing intervention  PRELIMINARY DISCHARGE PLAN: Attend aftercare/continuing care group Attend 12-step recovery group Return to previous living arrangement  PATIENT/FAMIILY INVOLVEMENT: This treatment plan has been presented to and reviewed with the patient, Nathaniel Warren, and/or family member,.  The patient and family have been given the opportunity to ask questions and make suggestions.  Nathaniel Warren 06/19/2013, 12:02 PM

## 2013-06-19 NOTE — [facility] Suicide Risk Assessment (Signed)
Suicide Risk Assessment  Admission Assessment     Nursing information obtained from:  Patient Demographic factors:  Male Current Mental Status:  Suicide plan;Self-harm thoughts Loss Factors:  Decrease in vocational status;Financial problems / change in socioeconomic status Historical Factors:  Prior suicide attempts;Impulsivity Risk Reduction Factors:  Living with another person, especially a relative;Positive social support;Positive therapeutic relationship  CLINICAL FACTORS:   Depression:   Anhedonia Comorbid alcohol abuse/dependence Hopelessness Insomnia Alcohol/Substance Abuse/Dependencies Previous Psychiatric Diagnoses and Treatments  COGNITIVE FEATURES THAT CONTRIBUTE TO RISK:  Closed-mindedness Loss of executive function Polarized thinking Thought constriction (tunnel vision)    SUICIDE RISK:   Moderate:  Frequent suicidal ideation with limited intensity, and duration, some specificity in terms of plans, no associated intent, good self-control, limited dysphoria/symptomatology, some risk factors present, and identifiable protective factors, including available and accessible social support.  PLAN OF CARE:  I certify that inpatient services furnished can reasonably be expected to improve the patient's condition.  Nathaniel Warren T. 06/19/2013, 1:52 PM

## 2013-06-19 NOTE — Progress Notes (Signed)
D. Pt has been up and active while in the milieu this afternoon and has participated in various milieu activities. Pt does not display any overt signs or symptoms of withdrawal, pt did state that he only drank that day prior to coming into the hospital and has denied any chronic use. Pt spoke about depression and having suicidal thoughts and feels that his medications are not working properly.  A. Medication education completed, support and encouragement provided. R. Pt verbalized understanding, will continue to monitor.

## 2013-06-19 NOTE — H&P (Signed)
Psychiatric Admission Assessment Adult  Patient Identification:  Nathaniel Warren  Date of Evaluation:  06/19/2013  Chief Complaint:  MDD,REC,SEV 296.33  History of Present Illness: This is a 47 year old Caucasian male. Admitted to [facility] from the [facility] ED with reports of taking a handful of his Buspar and Vistaril pills with a 40 ounce bottle of beer. Patient reports, "I went to the [facility] ED yesterday. I was feeling suicidal because I'm very depressed. It started about 2 weeks ago. I don't know why I stay depressed. I got to be feeling fed-up about feeling depressed, so I took a handful of my Buspar and vistaril pills and a bunch of alcohol together to kill myself. I passed out, woke up and went to the ED. I was in this hospital and got discharged about 2 months ago. I was not abusing any drugs and or alcohol like every body was thinking. I see a doctor at [facility], but I don't believe it is enough. I need talk therapy. A therapist that I can talk to about my depression and anxiety. I have a lot of anxiety, panic attacks and depression. I need to get back on my Seroquel, Neurontin etc. I have bipolar mental illness".  Elements:  Location:  [facility] adult unit. Quality:  Increased depression, suicidal ideations, suicidae attempt by overdoese. Severity:  Severe. Timing:  "My depression and suicidal thoughts started 2 weeks ago". Duration:  "I have been depressed for a long time". Context:  Increased anxiety, increased mood swings, Insomnia, .  Associated Signs/Synptoms:  Depression Symptoms:  depressed mood, suicidal thoughts with specific plan, suicidal attempt, panic attacks, insomnia,  (Hypo) Manic Symptoms:  Impulsivity, Irritable Mood, Labiality of Mood,  Anxiety Symptoms:  Excessive Worry, Panic Symptoms,  Psychotic Symptoms:  Hallucinations: None  PTSD Symptoms: Had a traumatic exposure:  None reported  Psychiatric Specialty Exam: Physical Exam   Constitutional: He is oriented to person, place, and time. He appears well-developed and well-nourished.  HENT:  Head: Normocephalic.  Eyes: Pupils are equal, round, and reactive to light.  Neck: Normal range of motion.  Cardiovascular: Normal rate.   Respiratory: Effort normal.  GI: Soft.  Musculoskeletal: Normal range of motion.  Neurological: He is alert and oriented to person, place, and time.  Skin: Skin is warm and dry.  Psychiatric: His speech is normal and behavior is normal. Thought content normal. His mood appears anxious (Rated at #9). Cognition and memory are normal. He expresses impulsivity. He exhibits a depressed mood (Rated at #10).    Review of Systems  Constitutional: Positive for chills and malaise/fatigue.  HENT: Negative.   Eyes: Negative.   Respiratory: Negative.   Cardiovascular: Negative.   Gastrointestinal: Negative.   Genitourinary: Negative.   Musculoskeletal: Negative.   Skin: Negative.   Neurological: Positive for weakness.  Endo/Heme/Allergies: Negative.   Psychiatric/Behavioral: Positive for depression (Rated at #10) and suicidal ideas.    Blood pressure 122/76, pulse 65.There is no weight on file to calculate BMI.  General Appearance: Fairly Groomed  Patent attorney::  Fair  Speech:  Clear and Coherent  Volume:  Normal  Mood:  Anxious, Depressed and rated both at #9  Affect:  Flat  Thought Process:  Coherent and Intact  Orientation:  Full (Time, Place, and Person)  Thought Content:  Rumination  Suicidal Thoughts:  Yes.  without intent/plan  Homicidal Thoughts:  No  Memory:  Immediate;   Good Recent;   Good Remote;   Good  Judgement:  Impaired  Insight:  Lacking  Psychomotor Activity:  Restlessness and Tremor  Concentration:  Poor  Recall:  Fair  Akathisia:  No  Handed:  Right  AIMS (if indicated):     Assets:  Desire for Improvement  Sleep:       Past Psychiatric History: Diagnosis: Alcohol dependence,  Hospitalizations: [facility] x  numerous times  Outpatient Care: [facility]  Substance Abuse Care: None reported  Self-Mutilation: Denies  Suicidal Attempts: Admitted attemptsthoughts  Violent Behaviors: None reported   Past Medical History:   Past Medical History  Diagnosis Date  . Stroke   . Seizures   . Bipolar 1 disorder   . Depression    Seizure History:  Seizure hx Cardiac History:  Stroke  Allergies:   Allergies  Allergen Reactions  . Asa (Aspirin) Other (See Comments)    High doses makes nose bleed   PTA Medications: Prescriptions prior to admission  Medication Sig Dispense Refill  . busPIRone (BUSPAR) 10 MG tablet Take 1 tablet (10 mg total) by mouth 3 (three) times daily. For anxiety  90 tablet  0  . clonazePAM (KLONOPIN) 1 MG tablet Take 1 tablet (1 mg total) by mouth 2 (two) times daily as needed (anxiety).  20 tablet  0  . gabapentin (NEURONTIN) 300 MG capsule Take 2 capsules (600 mg total) by mouth 3 (three) times daily. For anxiety symptoms/pain control  90 capsule  0  . hydrOXYzine (ATARAX/VISTARIL) 25 MG tablet Take 1 tablet (25 mg total) by mouth 2 (two) times daily as needed for anxiety.  60 tablet  0  . QUEtiapine (SEROQUEL) 200 MG tablet Take 1 tablet (200 mg total) by mouth at bedtime. For mood control  30 tablet  0  . sertraline (ZOLOFT) 100 MG tablet Take 1 tablet (100 mg total) by mouth daily. For depression  30 tablet  0  . traZODone (DESYREL) 50 MG tablet Take 1 tablet (50 mg total) by mouth at bedtime as needed for sleep.  30 tablet  0    Previous Psychotropic Medications:  Medication/Dose  See medication lists               Substance Abuse History in the last 12 months:  yes  Consequences of Substance Abuse: Medical Consequences:  Liver damage, Possible death by overdose Legal Consequences:  Arrests, jail time, Loss of driving privilege. Family Consequences:  Family discord, divorce and or separation.  Social History:  reports that he has been smoking Cigarettes.  He  has a 45 pack-year smoking history. He does not have any smokeless tobacco history on file. He reports that  drinks alcohol. He reports that he does not use illicit drugs. Additional Social History:  Current Place of Residence: [place], [place]  Place of Birth: [place], [place]    Family Members: "My wife and kid"  Marital Status:  Married  Children: 5  Sons: 2  Daughters: 3  Relationships: Married  Education:  [employer] Financial planner Problems/Performance: Completed high school  Religious Beliefs/Practices: NA  History of Abuse (Emotional/Phsycial/Sexual): None reported  Occupational Experiences: English as a second language teacher History:  None.  Legal History: None reported  Hobbies/Interests: None reported  Family History:  History reviewed. No pertinent family history.  Results for orders placed during the hospital encounter of 06/18/13 (from the past 72 hour(s))  URINE RAPID DRUG SCREEN (HOSP PERFORMED)     Status: Abnormal   Collection Time    06/18/13  4:39 PM      Result  Value Range   Opiates POSITIVE (*) NONE DETECTED   Cocaine NONE DETECTED  NONE DETECTED   Benzodiazepines NONE DETECTED  NONE DETECTED   Amphetamines NONE DETECTED  NONE DETECTED   Tetrahydrocannabinol POSITIVE (*) NONE DETECTED   Barbiturates NONE DETECTED  NONE DETECTED   Comment:            DRUG SCREEN FOR MEDICAL PURPOSES     ONLY.  IF CONFIRMATION IS NEEDED     FOR ANY PURPOSE, NOTIFY LAB     WITHIN 5 DAYS.                LOWEST DETECTABLE LIMITS     FOR URINE DRUG SCREEN     Drug Class       Cutoff (ng/mL)     Amphetamine      1000     Barbiturate      200     Benzodiazepine   200     Tricyclics       300     Opiates          300     Cocaine          300     THC              50  COMPREHENSIVE METABOLIC PANEL     Status: Abnormal   Collection Time    06/18/13  4:45 PM      Result Value Range   Sodium 141  135 - 145 mEq/L   Potassium 3.6  3.5 - 5.1 mEq/L   Chloride 104  96 - 112 mEq/L   CO2  28  19 - 32 mEq/L   Glucose, Bld 106 (*) 70 - 99 mg/dL   BUN 8  6 - 23 mg/dL   Creatinine, Ser 1.61  0.50 - 1.35 mg/dL   Calcium 9.0  8.4 - 09.6 mg/dL   Total Protein 7.2  6.0 - 8.3 g/dL   Albumin 4.0  3.5 - 5.2 g/dL   AST 38 (*) 0 - 37 U/L   ALT 29  0 - 53 U/L   Alkaline Phosphatase 92  39 - 117 U/L   Total Bilirubin 0.5  0.3 - 1.2 mg/dL   GFR calc non Af Amer >90  >90 mL/min   GFR calc Af Amer >90  >90 mL/min   Comment:            The eGFR has been calculated     using the CKD EPI equation.     This calculation has not been     validated in all clinical     situations.     eGFR's persistently     <90 mL/min signify     possible Chronic Kidney Disease.  ACETAMINOPHEN LEVEL     Status: None   Collection Time    06/18/13  4:45 PM      Result Value Range   Acetaminophen (Tylenol), Serum <15.0  10 - 30 ug/mL   Comment:            THERAPEUTIC CONCENTRATIONS VARY     SIGNIFICANTLY. A RANGE OF 10-30     ug/mL MAY BE AN EFFECTIVE     CONCENTRATION FOR MANY PATIENTS.     HOWEVER, SOME ARE BEST TREATED     AT CONCENTRATIONS OUTSIDE THIS     RANGE.     ACETAMINOPHEN CONCENTRATIONS     >150 ug/mL AT 4 HOURS AFTER  INGESTION AND >50 ug/mL AT 12     HOURS AFTER INGESTION ARE     OFTEN ASSOCIATED WITH TOXIC     REACTIONS.  ETHANOL     Status: Abnormal   Collection Time    06/18/13  4:45 PM      Result Value Range   Alcohol, Ethyl (B) 210 (*) 0 - 11 mg/dL   Comment:            LOWEST DETECTABLE LIMIT FOR     SERUM ALCOHOL IS 11 mg/dL     FOR MEDICAL PURPOSES ONLY  SALICYLATE LEVEL     Status: Abnormal   Collection Time    06/18/13  4:45 PM      Result Value Range   Salicylate Lvl <2.0 (*) 2.8 - 20.0 mg/dL  CBC WITH DIFFERENTIAL     Status: Abnormal   Collection Time    06/18/13  4:45 PM      Result Value Range   WBC 7.6  4.0 - 10.5 K/uL   RBC 4.70  4.22 - 5.81 MIL/uL   Hemoglobin 16.2  13.0 - 17.0 g/dL   HCT 21.3  08.6 - 57.8 %   MCV 95.5  78.0 - 100.0 fL   MCH  34.5 (*) 26.0 - 34.0 pg   MCHC 36.1 (*) 30.0 - 36.0 g/dL   RDW 46.9  62.9 - 52.8 %   Platelets 208  150 - 400 K/uL   Neutrophils Relative % 58  43 - 77 %   Neutro Abs 4.4  1.7 - 7.7 K/uL   Lymphocytes Relative 31  12 - 46 %   Lymphs Abs 2.3  0.7 - 4.0 K/uL   Monocytes Relative 10  3 - 12 %   Monocytes Absolute 0.8  0.1 - 1.0 K/uL   Eosinophils Relative 1  0 - 5 %   Eosinophils Absolute 0.1  0.0 - 0.7 K/uL   Basophils Relative 0  0 - 1 %   Basophils Absolute 0.0  0.0 - 0.1 K/uL  ACETAMINOPHEN LEVEL     Status: None   Collection Time    06/18/13  7:00 PM      Result Value Range   Acetaminophen (Tylenol), Serum <15.0  10 - 30 ug/mL   Comment:            THERAPEUTIC CONCENTRATIONS VARY     SIGNIFICANTLY. A RANGE OF 10-30     ug/mL MAY BE AN EFFECTIVE     CONCENTRATION FOR MANY PATIENTS.     HOWEVER, SOME ARE BEST TREATED     AT CONCENTRATIONS OUTSIDE THIS     RANGE.     ACETAMINOPHEN CONCENTRATIONS     >150 ug/mL AT 4 HOURS AFTER     INGESTION AND >50 ug/mL AT 12     HOURS AFTER INGESTION ARE     OFTEN ASSOCIATED WITH TOXIC     REACTIONS.   Psychological Evaluations:  Assessment:   AXIS I:  Alcohol dependence, AXIS II:  Deferred AXIS III:   Past Medical History  Diagnosis Date  . Stroke   . Seizures   . Bipolar 1 disorder   . Depression    AXIS IV:  Substance dependence AXIS V:  11-20 some danger of hurting self or others possible OR occasionally fails to maintain minimal personal hygiene OR gross impairment in communication  Treatment Plan/Recommendations: 1. Admit for crisis management and stabilization, estimated length of stay 3-5 days.  2. Medication management to reduce  current symptoms to base line and improve the patient's overall level of functioning; (a). Initiate Librium protocol for alcohol detoxification treatment. 3. Treat health problems as indicated.  4. Develop treatment plan to decrease risk of relapse upon discharge and the need for readmission.   5. Psycho-social education regarding relapse prevention and self care.  6. Health care follow up as needed for medical problems.  7. Review, reconcile, and reinstate any pertinent home medications for other health issues where appropriate. 8. Call for consults with hospitalist for any additional specialty patient care services as needed.  Treatment Plan Summary: Daily contact with patient to assess and evaluate symptoms and progress in treatment Medication management  Current Medications:  Current Facility-Administered Medications  Medication Dose Route Frequency Provider Last Rate Last Dose  . acetaminophen (TYLENOL) tablet 650 mg  650 mg Oral Q6H PRN Fransisca Kaufmann, NP      . alum & mag hydroxide-simeth (MAALOX/MYLANTA) 200-200-20 MG/5ML suspension 30 mL  30 mL Oral Q4H PRN Fransisca Kaufmann, NP      . chlordiazePOXIDE (LIBRIUM) capsule 25 mg  25 mg Oral Q6H PRN Sanjuana Kava, NP      . chlordiazePOXIDE (LIBRIUM) capsule 25 mg  25 mg Oral Once Sanjuana Kava, NP      . chlordiazePOXIDE (LIBRIUM) capsule 25 mg  25 mg Oral QID Sanjuana Kava, NP       Followed by  . [START ON 06/20/2013] chlordiazePOXIDE (LIBRIUM) capsule 25 mg  25 mg Oral TID Sanjuana Kava, NP       Followed by  . [START ON 06/21/2013] chlordiazePOXIDE (LIBRIUM) capsule 25 mg  25 mg Oral BH-qamhs Sanjuana Kava, NP       Followed by  . [START ON 06/23/2013] chlordiazePOXIDE (LIBRIUM) capsule 25 mg  25 mg Oral Daily Sanjuana Kava, NP      . hydrOXYzine (ATARAX/VISTARIL) tablet 25 mg  25 mg Oral Q6H PRN Sanjuana Kava, NP      . loperamide (IMODIUM) capsule 2-4 mg  2-4 mg Oral PRN Sanjuana Kava, NP      . magnesium hydroxide (MILK OF MAGNESIA) suspension 30 mL  30 mL Oral Daily PRN Fransisca Kaufmann, NP      . multivitamin with minerals tablet 1 tablet  1 tablet Oral Daily Sanjuana Kava, NP      . nicotine (NICODERM CQ - dosed in mg/24 hours) patch 21 mg  21 mg Transdermal Q0600 Rachael Fee, MD   21 mg at 06/19/13 1134  . ondansetron  (ZOFRAN-ODT) disintegrating tablet 4 mg  4 mg Oral Q6H PRN Sanjuana Kava, NP      . thiamine (B-1) injection 100 mg  100 mg Intramuscular Once Sanjuana Kava, NP      . Melene Muller ON 06/20/2013] thiamine (VITAMIN B-1) tablet 100 mg  100 mg Oral Daily Sanjuana Kava, NP      . traZODone (DESYREL) tablet 50 mg  50 mg Oral QHS PRN Fransisca Kaufmann, NP        Observation Level/Precautions:  15 minute checks  Laboratory:  Reviewed ED lab finding on file  Psychotherapy:  Group sessions  Medications: See medication lists   Consultations:  As needed  Discharge Concerns: Safety/sobriety  Estimated LOS: 3-5 days  Other:     I certify that inpatient services furnished can reasonably be expected to improve the patient's condition.   Sanjuana Kava, PMHNP-BC 7/13/201412:13 PM I have examined the patient an agreed  with the findings of H&P. I am involved in the treatment Plan.

## 2013-06-19 NOTE — Progress Notes (Signed)
Patient did attend the evening speaker AA meeting.  

## 2013-06-19 NOTE — [facility] Group Notes (Signed)
Adult Psychoeducational Group Note   Date: 06/19/2013   Time: 1315   Group Topic/Focus:Healthy Support Systems   Making Healthy Choices: The focus of this group is to help patients identify healthy support systems  .  Participation Level: Active   Participation Quality: Minimal  Affect: Appropriate   Cognitive: Alert   Insight: Limited  Engagement in Group: Minimal  Modes of Intervention: Discussion and Education   Additional Comments

## 2013-06-20 DIAGNOSIS — F411 Generalized anxiety disorder: Secondary | ICD-10-CM

## 2013-06-20 DIAGNOSIS — F339 Major depressive disorder, recurrent, unspecified: Secondary | ICD-10-CM

## 2013-06-20 MED ORDER — METHOCARBAMOL 500 MG PO TABS
500.0000 mg | ORAL_TABLET | Freq: Three times a day (TID) | ORAL | Status: DC
  Administered 2013-06-20 – 2013-06-23 (×9): 500 mg via ORAL
  Filled 2013-06-20 (×15): qty 1

## 2013-06-20 MED ORDER — PAROXETINE HCL 20 MG PO TABS
20.0000 mg | ORAL_TABLET | Freq: Every day | ORAL | Status: DC
  Administered 2013-06-21 – 2013-06-23 (×3): 20 mg via ORAL
  Filled 2013-06-20 (×3): qty 1
  Filled 2013-06-20: qty 14
  Filled 2013-06-20 (×2): qty 1

## 2013-06-20 MED ORDER — IBUPROFEN 800 MG PO TABS
800.0000 mg | ORAL_TABLET | Freq: Three times a day (TID) | ORAL | Status: DC | PRN
  Administered 2013-06-20 – 2013-06-22 (×3): 800 mg via ORAL
  Filled 2013-06-20 (×3): qty 1

## 2013-06-20 NOTE — Progress Notes (Signed)
Patient ID: Nathaniel Warren, male   DOB: 04/27/1966, 47 y.o.   MRN: [id number] D)  Attended group this evening, but has spent some time afterward sitting in his room and talking to his roommate.  States he had gone to [facility] after his last admission, states he saw the MD once, and he changed his meds, hasn't felt right since.  Came back to try to get his meds adjusted and to get himself back on track.  States has been having panic attacks and dealing with anxiety since that time.  Affect flat, sad, quiet. A)  Will continue to monitor for safety, support, praise for coming back to try again, recognizing need to get help, will continue POC R)  Safety maintained.

## 2013-06-20 NOTE — Progress Notes (Signed)
Psychoeducational Group Note  Date:  06/20/2013 Time:  1100  Group Topic/Focus:  Wellness Toolbox:   The focus of this group is to discuss various aspects of wellness, balancing those aspects and exploring ways to increase the ability to experience wellness.  Patients will create a wellness toolbox for use upon discharge.  Participation Level: Did Not Attend  Participation Quality:  Not Applicable  Affect:  Not Applicable  Cognitive:  Not Applicable  Insight:  Not Applicable  Engagement in Group: Not Applicable  Additional Comments:  Pt did not attend group.  Sharyn Lull 06/20/2013, 11:50 AM

## 2013-06-20 NOTE — Progress Notes (Signed)
Patient ID: Nathaniel Warren, male   DOB: 1966-02-15, 47 y.o.   MRN: [id number] He has been up and to groups interacting with peers and staff. Is positive for SI but contracts for safety.   Self inventory: depression and hopeless at 9. Denies withdrawal symptoms.

## 2013-06-20 NOTE — Progress Notes (Signed)
Patient ID: Nathaniel Warren, male   DOB: 03/06/1966, 47 y.o.   MRN: [id number] D: pt. In dayroom watching TV and interacting.  Pt. Says he wants to get treatment, "reports depression at "9" of 10. "see Dr. Dub Mikes, get my medicine right, talk to you guys, get help"  A: Writer introduced self to client and encouraged group. Staff will monitor q40min for safety. R: Pt. Is safe on the unit and attended group.

## 2013-06-20 NOTE — Tx Team (Signed)
Interdisciplinary Treatment Plan Update (Adult)  Date: 06/20/2013   Time Reviewed: 11:37 AM  Progress in Treatment:  Attending groups: Yes Participating in groups:  Yes Taking medication as prescribed: Yes  Tolerating medication: Yes  Family/Significant othe contact made: Not yet. SPE required for this pt. Passive SI prior to admittance.  Patient understands diagnosis: Yes, AEB seeking treatment for substance abuse, alcohol detox, and depression.  Discussing patient identified problems/goals with staff: Yes  Medical problems stabilized or resolved: Yes  Denies suicidal/homicidal ideation: Yes in group. Patient has not harmed self or Others: Yes  New problem(s) identified: n/a  Discharge Plan or Barriers: pt plans to follow up at [facility] for med management and group therapy. He plans to return home-[place].   Additional comments: CEFERINO LANG is an 47 y.o. male. He presents voluntarily to the emergency department after he took a few buspar and vistaril pills; stating he just put them in his hand and took them with a 40 ounce beer. He states he has had depression for several years now and that it hasn't gotten much better. He has been taking his vistaril and buspar as directed, but has run out of his neurotin and klonopin about two weeks ago. He states he gets his medications from a PAP at [facility]. He states that he isn't working and is not getting disability; he states he wants to work but because of his seizures, he is not able to drive and this limits him to be able to work. He has not filed for disability and we did discuss the process for this during the assessment; this would benefit him for income and insurance. He describes feelings of hopelessness and helplessness. He just moved to [facility] 2 weeks ago because he wasn't able to get along with his daughter's boyfriend in the apartment they all resided in. He states that he isn't sleeping much; waking up frequently  and not staying asleep for more than 2 hours or so. He has no appetite and reports he has lost 10 lbs in the last month. He states he is tired of feeling like this and just wants to end it. After he took the pills, his friend convinced him to come to the hospital to seek help. He does not have any delusions or hallucinations. No homicidal ideation. He drinks alcohol, two times per week; between two and three beers at most.  Reason for Continuation of Hospitalization: Withdrawals-librium taper Mood stabilization Medication management Estimated length of stay: 2-3 days  For review of initial/current patient goals, please see plan of care.  Attendees:  Patient:    Family:    Physician: Geoffery Lyons MD 06/20/2013 12:40 PM   Nursing: Deeann Cree RN 06/20/2013 12:40 PM  Clinical Social Worker Gean Laursen Smart, LCSWA  06/20/2013 12:40 PM  Other: Maureen Ralphs RN 06/20/2013 12:40 PM  Other: Aggie PA 06/20/2013 12:40 PM   Other: Darden Dates Nurse  06/20/2013 12:40 PM   Other:    Scribe for Treatment Team:  [employer] LCSWA 06/20/2013 12:40 PM

## 2013-06-20 NOTE — [facility] Group Notes (Signed)
[facility] LCSW Aftercare Discharge Planning Group Note   06/20/2013 11:05 AM  Participation Quality:  Appropriate   Mood/Affect:  Depressed  Depression Rating:  9  Anxiety Rating:  9  Thoughts of Suicide:  No Will you contract for safety?   NA  Current AVH:  No  Plan for Discharge/Comments:  Pt just moved to [facility] and was following up at [facility] in [place]. Pt aware that he will need to follow up at [facility] for med management and group therapy.   Transportation Means: daughter   Supports: daughter. No other family supports identified   Counselling psychologist, Research scientist (physical sciences)

## 2013-06-20 NOTE — [facility] Group Notes (Signed)
[facility] LCSW Group Therapy  06/20/2013 2:28 PM  Type of Therapy:  Group Therapy  Participation Level:  Active  Participation Quality:  Attentive  Affect:  Appropriate  Cognitive:  Appropriate  Insight:  Engaged  Engagement in Therapy:  Engaged  Modes of Intervention:  Confrontation, Discussion, Education, Exploration, Socialization and Support  Summary of Progress/Problems: Today's Topic: Overcoming Obstacles. Pt identified obstacles faced currently and processed barriers involved in overcoming these obstacles. Pt identified steps necessary for overcoming these obstacles and explored motivation (internal and external) for facing these difficulties head on. Pt further identified one area of concern in their lives and chose a skill of focus pulled from their "toolbox." Leandro identified "dealing with my suicidal thoughts and mental illness" and "living in a stressful home" as the two obstacles he anticipates facing after d/c. Kevork was encouraged to explore a time in his life when he was not tormented by SI and explain to the group what was different in his life during this time. Chet was able to process how taking his medications as prescribed helped him overcome the obstacle of chronic SI, but blamed the return of SI on a [facility] psychiatrist for altering his medication regimen. Rogelio was encouraged to explore how he can advocate for himself in the future and how he could have handled this situation in the past. He stated that "I could have been more forceful in telling my psychiatrist that I did not want him to adjust my meds because they were working fine." Linc talked about his stressful living situation and stated that he intends to job hunt immediately after d/c in order to save enough money to move. When asked to think about how he would deal with the stressful environment until he moved, Weyman stated that he would spend as much time away from home as possible--job hunting, going for walks,  attending meetings, etc. Yeison showed that he is making progress by coming up with alternative coping skills/ways to deal with the obstacles that he identified.    Smart, Cearra Portnoy 06/20/2013, 2:28 PM

## 2013-06-20 NOTE — BHH Counselor (Signed)
Adult Psychosocial Assessment Update Interdisciplinary Team  Previous [facility] admissions/discharges:  Admissions   Date: 06/19/13 Date:  Date: 04/09/13 Date:  Date: 01/09/13 Date:  Date: Date:  Date: Date:   Changes since the last Psychosocial Assessment (including adherence to outpatient mental health and/or substance abuse treatment, situational issues contributing to decompensation and/or relapse). He states he gets his medications from a PAP at [facility]. He states that he isn't working and is not getting disability; he states he wants to work but because of his seizures, he is not able to drive and this limits him to be able to work. He has not filed for disability and we did discuss the process for this during the assessment; this would benefit him for income and insurance. He describes feelings of hopelessness and helplessness. He just moved to [facility] 2 weeks ago because he wasn't able to get along with his daughter's boyfriend in the apartment they all resided in. He states that he isn't sleeping much; waking up frequently and not staying asleep for more than 2 hours or so. He has no appetite and reports he has lost 10 lbs in the last month. He states he is tired of feeling like this and just wants to end it. After he took the pills, his friend convinced him to come to the hospital to seek help. He does not have any delusions or hallucinations. No homicidal ideation. He drinks alcohol, two times per week; between two and three beers at most.             Discharge Plan 1. Will you be returning to the same living situation after discharge?   Yes: No:      If no, what is your plan?    Pt recently moved to [facility] and plans to return home after d/c. He stated that he may be interested in i/p facility but will most likely return home. CSW to follow up with pt this afternoon to talk about discharge plan.        2. Would you like a referral for services  when you are discharged? Yes:     If yes, for what services?  No:       Pt interested in [facility] for med management and group therapy. He expressed some interest in i/p facility--[facility].        Summary and Recommendations (to be completed by the evaluator) Pt is 47 year old male living in [place]. He recently moved from [facility]. Pt presents to [facility] for treatment with substance abuse/alcohol, THC, and opiates. Recommendations for pt include: therapeutic milieu, encourage group attendance and participation, medication management for mood stabilization, librium taper for ETOH detox, and development of comprehensive mental wellness/sobriety plan.                        Signature:  Trula Slade, 06/20/2013 12:58 PM

## 2013-06-20 NOTE — Progress Notes (Signed)
[facility] MD Progress Note  06/20/2013 4:45 PM Nathaniel Warren  MRN:  [id number] Subjective:  Nathaniel Warren states that his medications were changed when he went for follow up. He did not feel well. He was able to hold for a while but ended up relapsing. He wants to be able to get his symptoms under control. He also wants to be able to see a counselor so he can continue to deal with the issues that keep coming back in his life.  Diagnosis:  Major Depression recurrent, GAD, Alcohol dependence  ADL's:  Intact  Sleep: Fair  Appetite:  Fair  Suicidal Ideation:  Plan:  denies Intent:  denies Means:  denies Homicidal Ideation:  Plan:  denies Intent:  denies Means:  denies AEB (as evidenced by):  Psychiatric Specialty Exam: Review of Systems  Constitutional: Negative.   HENT: Negative.   Eyes: Negative.   Respiratory: Negative.   Cardiovascular: Negative.   Gastrointestinal: Negative.   Genitourinary: Negative.   Musculoskeletal: Negative.   Skin: Negative.   Neurological: Negative.   Endo/Heme/Allergies: Negative.   Psychiatric/Behavioral: Positive for depression, suicidal ideas and substance abuse. The patient is nervous/anxious and has insomnia.     Blood pressure 96/73, pulse 77, temperature 98.5 F (36.9 C), temperature source Oral, resp. rate 20.There is no weight on file to calculate BMI.  General Appearance: Fairly Groomed  Patent attorney::  prolongued  Speech:  Clear and Coherent and Slow  Volume:  Decreased  Mood:  Anxious, Depressed and worried  Affect:  Restricted  Thought Process:  Coherent and Goal Directed  Orientation:  Full (Time, Place, and Person)  Thought Content:  worries, concerns, symptoms feeling overwhelmed  Suicidal Thoughts:  Yes.  without intent/plan  Homicidal Thoughts:  No  Memory:  Immediate;   Fair Recent;   Fair Remote;   Fair  Judgement:  Fair  Insight:  Present  Psychomotor Activity:  Restlessness  Concentration:  Fair  Recall:  Fair  Akathisia:   No  Handed:  Right  AIMS (if indicated):     Assets:  Desire for Improvement  Sleep:  Number of Hours: 6   Current Medications: Current Facility-Administered Medications  Medication Dose Route Frequency Provider Last Rate Last Dose  . acetaminophen (TYLENOL) tablet 650 mg  650 mg Oral Q6H PRN Fransisca Kaufmann, NP      . alum & mag hydroxide-simeth (MAALOX/MYLANTA) 200-200-20 MG/5ML suspension 30 mL  30 mL Oral Q4H PRN Fransisca Kaufmann, NP      . busPIRone (BUSPAR) tablet 10 mg  10 mg Oral TID Sanjuana Kava, NP   10 mg at 06/20/13 1159  . chlordiazePOXIDE (LIBRIUM) capsule 25 mg  25 mg Oral Q6H PRN Sanjuana Kava, NP   25 mg at 06/20/13 0643  . chlordiazePOXIDE (LIBRIUM) capsule 25 mg  25 mg Oral TID Sanjuana Kava, NP   25 mg at 06/20/13 1159   Followed by  . [START ON 06/21/2013] chlordiazePOXIDE (LIBRIUM) capsule 25 mg  25 mg Oral BH-qamhs Sanjuana Kava, NP       Followed by  . [START ON 06/22/2013] chlordiazePOXIDE (LIBRIUM) capsule 25 mg  25 mg Oral Daily Sanjuana Kava, NP      . gabapentin (NEURONTIN) capsule 600 mg  600 mg Oral TID Sanjuana Kava, NP   600 mg at 06/20/13 1159  . hydrOXYzine (ATARAX/VISTARIL) tablet 25 mg  25 mg Oral Q6H PRN Sanjuana Kava, NP      . ibuprofen (ADVIL,MOTRIN) tablet  800 mg  800 mg Oral Q8H PRN Rachael Fee, MD   800 mg at 06/20/13 1543  . loperamide (IMODIUM) capsule 2-4 mg  2-4 mg Oral PRN Sanjuana Kava, NP      . magnesium hydroxide (MILK OF MAGNESIA) suspension 30 mL  30 mL Oral Daily PRN Fransisca Kaufmann, NP      . multivitamin with minerals tablet 1 tablet  1 tablet Oral Daily Sanjuana Kava, NP   1 tablet at 06/20/13 [phone]  . nicotine (NICODERM CQ - dosed in mg/24 hours) patch 21 mg  21 mg Transdermal Q0600 Rachael Fee, MD   21 mg at 06/20/13 [phone]  . ondansetron (ZOFRAN-ODT) disintegrating tablet 4 mg  4 mg Oral Q6H PRN Sanjuana Kava, NP      . Melene Muller ON 06/21/2013] PARoxetine (PAXIL) tablet 20 mg  20 mg Oral Daily Rachael Fee, MD      . QUEtiapine (SEROQUEL)  tablet 200 mg  200 mg Oral QHS Sanjuana Kava, NP   200 mg at 06/19/13 2212  . thiamine (B-1) injection 100 mg  100 mg Intramuscular Once Sanjuana Kava, NP      . thiamine (VITAMIN B-1) tablet 100 mg  100 mg Oral Daily Sanjuana Kava, NP   100 mg at 06/20/13 0852  . traZODone (DESYREL) tablet 50 mg  50 mg Oral QHS PRN Fransisca Kaufmann, NP      . traZODone (DESYREL) tablet 50 mg  50 mg Oral QHS PRN Sanjuana Kava, NP   50 mg at 06/19/13 2212    Lab Results:  Results for orders placed during the hospital encounter of 06/18/13 (from the past 48 hour(s))  ACETAMINOPHEN LEVEL     Status: None   Collection Time    06/18/13  7:00 PM      Result Value Range   Acetaminophen (Tylenol), Serum <15.0  10 - 30 ug/mL   Comment:            THERAPEUTIC CONCENTRATIONS VARY     SIGNIFICANTLY. A RANGE OF 10-30     ug/mL MAY BE AN EFFECTIVE     CONCENTRATION FOR MANY PATIENTS.     HOWEVER, SOME ARE BEST TREATED     AT CONCENTRATIONS OUTSIDE THIS     RANGE.     ACETAMINOPHEN CONCENTRATIONS     >150 ug/mL AT 4 HOURS AFTER     INGESTION AND >50 ug/mL AT 12     HOURS AFTER INGESTION ARE     OFTEN ASSOCIATED WITH TOXIC     REACTIONS.    Physical Findings: AIMS: Facial and Oral Movements Muscles of Facial Expression: None, normal Lips and Perioral Area: None, normal Jaw: None, normal Tongue: None, normal,Extremity Movements Upper (arms, wrists, hands, fingers): None, normal Lower (legs, knees, ankles, toes): None, normal, Trunk Movements Neck, shoulders, hips: None, normal, Overall Severity Severity of abnormal movements (highest score from questions above): None, normal Incapacitation due to abnormal movements: None, normal Patient's awareness of abnormal movements (rate only patient's report): No Awareness, Dental Status Current problems with teeth and/or dentures?: No Does patient usually wear dentures?: No  CIWA:  CIWA-Ar Total: 0 COWS:     Treatment Plan Summary: Daily contact with patient to  assess and evaluate symptoms and progress in treatment Medication management  Plan: Supportive approach/coping skills/relapse prevention           Complete the detox           Reassess  and address the co morbidities            Resume previous medication regime  Medical Decision Making Problem Points:  Review of psycho-social stressors (1) Data Points:  Review of medication regiment & side effects (2) Review of new medications or change in dosage (2)  I certify that inpatient services furnished can reasonably be expected to improve the patient's condition.   Jovie Swanner A 06/20/2013, 4:45 PM

## 2013-06-21 MED ORDER — QUETIAPINE FUMARATE 50 MG PO TABS
50.0000 mg | ORAL_TABLET | Freq: Three times a day (TID) | ORAL | Status: DC
  Administered 2013-06-21 – 2013-06-23 (×6): 50 mg via ORAL
  Filled 2013-06-21 (×7): qty 1
  Filled 2013-06-21 (×2): qty 42
  Filled 2013-06-21 (×2): qty 1
  Filled 2013-06-21: qty 42
  Filled 2013-06-21: qty 1

## 2013-06-21 NOTE — Progress Notes (Signed)
Recreation Therapy Notes  Date: 07.15.2014 Time: 2:30pm Location: [address] Dayroom      Group Topic/Focus: Animal Assist Activities/Therapy (AAA/T)  Session: Animal Assisted Activities (AAA)  Dog Team: [place] [employer] & handler  Affect: Euthymic  Cognitive: Appropraite  Participation Level: Active  Additional Comments: Patient interacted appropriately with peer, dog team, LRT and MHT.   Brindle Leyba L Taimi Towe, LRT/CTRS  Leina Babe L 06/21/2013 4:55 PM 

## 2013-06-21 NOTE — [facility] Group Notes (Signed)
[facility] LCSW Aftercare Discharge Planning Group Note   06/21/2013 10:05 AM  Participation Quality:  Minimal  Mood/Affect:  Blunted  Depression Rating:  10  Anxiety Rating:  6  Thoughts of Suicide:  No Will you contract for safety?   NA  Current AVH:  No  Plan for Discharge/Comments:  Pt decided that he would like [facility] admission date. He stated that he now considers himself homeless and [employer]. Resident being that he does not plan to stay with his daughter in [place]. Pt stated that he is aware he will be following up at [facility] for med management. [facility] date: Tues 7/22  Transportation Means: family member  Supports: wife  Counselling psychologist, [place]

## 2013-06-21 NOTE — Progress Notes (Signed)
Adult Psychoeducational Group Note  Date:  06/21/2013 Time:  12:03 PM  Group Topic/Focus:  Recovery Goals:   The focus of this group is to identify appropriate goals for recovery and establish a plan to achieve them.  Participation Level:  Active  Participation Quality:  Appropriate and Attentive  Affect:  Appropriate  Cognitive:  Alert and Appropriate  Insight: Good  Engagement in Group:  Engaged  Modes of Intervention:  Activity, Discussion, Socialization and Support  Additional Comments:  Pt came to group and shared that he wanted to change his alcohol use and his medication mismanagement. He wants to change these things by going to meetings three times a week and staying on his medication for depression.  The purpose of this group was for the pt to identify two things in their life that is standing between them and recovery. The pt then thought of and shared their specific and measurable goals to address the changes they wanted to see in their life. The goals impacted personal recovery.     Cathlean Cower 06/21/2013, 12:03 PM

## 2013-06-21 NOTE — Progress Notes (Signed)
D: Pt has participated appropriately in all unit activities. CIWA=1. Passive SI but contracts for safety on the unit.  The patient is depressed & hopeless @ this time. A: Supported & encouraged; continues on 15 minute checks.R: Pt safety maintained.

## 2013-06-21 NOTE — [facility] Suicide Risk Assessment (Signed)
[facility] INPATIENT:  Family/Significant Other Suicide Prevention Education  Suicide Prevention Education:  Patient Refusal for Family/Significant Other Suicide Prevention Education: The patient Nathaniel Warren has refused to provide written consent for family/significant other to be provided Family/Significant Other Suicide Prevention Education during admission and/or prior to discharge.  Physician notified.  SPE completed with pt. He was given SPI pamphlet and encouraged to ask questions and talk about any concerns. Pt also provided with mobile crisis hotline.    Smart, Nathaniel Warren 06/21/2013, 12:43 PM

## 2013-06-21 NOTE — Progress Notes (Signed)
Recreation Therapy Notes  Date: 07.15.2014 Time: 3:00pm Location: [address] Dayroom   Group Topic/Focus: Problem Solving, Communication  Participation Level:  Active  Participation Quality:  Appropriate  Affect:  Euthymic  Cognitive:  Appropriate  Additional Comments: Activity: LIfeboat ; Explanation: Patients were given the following scenario: We have all chartered a Radio producer for the afternoon with the following people: Materials engineer, Rolan Bucco, [place], [place], [place], Curator, Psychologist, occupational, Runner, broadcasting/film/video, Pregnant Woman, Male Physician, Nurse, Personnel officer, Ex-Convict, Ex-marine, and [employer]. Halfway through our boat tour and the [employer] a leak and begins to sink. We are able to fit everyone in the room, plus 8 of the people previously listed in the life raft. As a group you must decide who you want to take on the life raft and who you want to leave behind.   Patient actively participated in group activity. Patient debated appropriately with peers to come to a group decision on individuals to put on life raft. Patient identified knowing what skills peer group possesses as important part of decisions making process. Patient identified knowing who would benefit you as a skill necessary to complete this activity. Patient related this to his problem solving skills. Patient verablized understanding that skills used in group session are important to life post d/c.   Marykay Lex Vian Fluegel, LRT/CTRS  Jearl Klinefelter 06/21/2013 4:36 PM

## 2013-06-21 NOTE — Progress Notes (Signed)
Patient ID: ALYX GEE, male   DOB: 1966-06-03, 47 y.o.   MRN: [id number] [facility] MD Progress Note  06/21/2013 3:26 PM DEGAN HANSER  MRN:  [id number]  Subjective:  Mr. Ramon Dredge reports that he is still having high anxiety issues. He adds that he still feeling very depressed and continue to report suicidal ideations. States that he thought about hanging himself, knows it is ipossible to carry it out in this hospital. Requested something for his high anxiety.  Diagnosis:  Major Depression recurrent, GAD, Alcohol dependence  ADL's:  Intact  Sleep: Fair  Appetite:  Fair  Suicidal Ideation:  Plan:  denies Intent:  denies Means:  denies Homicidal Ideation:  Plan:  denies Intent:  denies Means:  denies AEB (as evidenced by):  Psychiatric Specialty Exam: Review of Systems  Constitutional: Negative.   HENT: Negative.   Eyes: Negative.   Respiratory: Negative.   Cardiovascular: Negative.   Gastrointestinal: Negative.   Genitourinary: Negative.   Musculoskeletal: Negative.   Skin: Negative.   Neurological: Negative.   Endo/Heme/Allergies: Negative.   Psychiatric/Behavioral: Positive for depression, suicidal ideas and substance abuse. The patient is nervous/anxious and has insomnia.     Blood pressure 104/71, pulse 67, temperature 98.3 F (36.8 C), temperature source Oral, resp. rate 18.There is no weight on file to calculate BMI.  General Appearance: Fairly Groomed  Patent attorney::  prolongued  Speech:  Clear and Coherent and Slow  Volume:  Decreased  Mood:  Anxious, Depressed and worried  Affect:  Restricted  Thought Process:  Coherent and Goal Directed  Orientation:  Full (Time, Place, and Person)  Thought Content:  worries, concerns, symptoms feeling overwhelmed  Suicidal Thoughts:  Yes.  without intent/plan  Homicidal Thoughts:  No  Memory:  Immediate;   Fair Recent;   Fair Remote;   Fair  Judgement:  Fair  Insight:  Present  Psychomotor Activity:  Restlessness   Concentration:  Fair  Recall:  Fair  Akathisia:  No  Handed:  Right  AIMS (if indicated):     Assets:  Desire for Improvement  Sleep:  Number of Hours: 6.25   Current Medications: Current Facility-Administered Medications  Medication Dose Route Frequency Provider Last Rate Last Dose  . acetaminophen (TYLENOL) tablet 650 mg  650 mg Oral Q6H PRN Fransisca Kaufmann, NP   650 mg at 06/20/13 1711  . alum & mag hydroxide-simeth (MAALOX/MYLANTA) 200-200-20 MG/5ML suspension 30 mL  30 mL Oral Q4H PRN Fransisca Kaufmann, NP      . busPIRone (BUSPAR) tablet 10 mg  10 mg Oral TID Sanjuana Kava, NP   10 mg at 06/21/13 1143  . chlordiazePOXIDE (LIBRIUM) capsule 25 mg  25 mg Oral Q6H PRN Sanjuana Kava, NP   25 mg at 06/20/13 0643  . chlordiazePOXIDE (LIBRIUM) capsule 25 mg  25 mg Oral BH-qamhs Sanjuana Kava, NP   25 mg at 06/21/13 0835   Followed by  . [START ON 06/22/2013] chlordiazePOXIDE (LIBRIUM) capsule 25 mg  25 mg Oral Daily Sanjuana Kava, NP      . gabapentin (NEURONTIN) capsule 600 mg  600 mg Oral TID Sanjuana Kava, NP   600 mg at 06/21/13 1144  . hydrOXYzine (ATARAX/VISTARIL) tablet 25 mg  25 mg Oral Q6H PRN Sanjuana Kava, NP   25 mg at 06/20/13 1711  . ibuprofen (ADVIL,MOTRIN) tablet 800 mg  800 mg Oral Q8H PRN Rachael Fee, MD   800 mg at 06/21/13 7829  . loperamide (  IMODIUM) capsule 2-4 mg  2-4 mg Oral PRN Sanjuana Kava, NP      . magnesium hydroxide (MILK OF MAGNESIA) suspension 30 mL  30 mL Oral Daily PRN Fransisca Kaufmann, NP      . methocarbamol (ROBAXIN) tablet 500 mg  500 mg Oral TID Rachael Fee, MD   500 mg at 06/21/13 1143  . multivitamin with minerals tablet 1 tablet  1 tablet Oral Daily Sanjuana Kava, NP   1 tablet at 06/21/13 0834  . nicotine (NICODERM CQ - dosed in mg/24 hours) patch 21 mg  21 mg Transdermal Q0600 Rachael Fee, MD   21 mg at 06/21/13 0641  . ondansetron (ZOFRAN-ODT) disintegrating tablet 4 mg  4 mg Oral Q6H PRN Sanjuana Kava, NP      . PARoxetine (PAXIL) tablet 20 mg  20 mg  Oral Daily Rachael Fee, MD   20 mg at 06/21/13 [phone]  . QUEtiapine (SEROQUEL) tablet 200 mg  200 mg Oral QHS Sanjuana Kava, NP   200 mg at 06/20/13 2149  . thiamine (B-1) injection 100 mg  100 mg Intramuscular Once Sanjuana Kava, NP      . thiamine (VITAMIN B-1) tablet 100 mg  100 mg Oral Daily Sanjuana Kava, NP   100 mg at 06/21/13 6213  . traZODone (DESYREL) tablet 50 mg  50 mg Oral QHS PRN Fransisca Kaufmann, NP      . traZODone (DESYREL) tablet 50 mg  50 mg Oral QHS PRN Sanjuana Kava, NP   50 mg at 06/20/13 2312    Lab Results:  No results found for this or any previous visit (from the past 48 hour(s)).  Physical Findings: AIMS: Facial and Oral Movements Muscles of Facial Expression: None, normal Lips and Perioral Area: None, normal Jaw: None, normal Tongue: None, normal,Extremity Movements Upper (arms, wrists, hands, fingers): None, normal Lower (legs, knees, ankles, toes): None, normal, Trunk Movements Neck, shoulders, hips: None, normal, Overall Severity Severity of abnormal movements (highest score from questions above): None, normal Incapacitation due to abnormal movements: None, normal Patient's awareness of abnormal movements (rate only patient's report): No Awareness, Dental Status Current problems with teeth and/or dentures?: No Does patient usually wear dentures?: No  CIWA:  CIWA-Ar Total: 0 COWS:     Treatment Plan Summary: Daily contact with patient to assess and evaluate symptoms and progress in treatment Medication management  Plan: Supportive approach/coping skills/relapse prevention Complete the detox Reassess and address the co morbidities. Add Seroquel 50 mg tid for anxiety.  Discharge plans in progress.  Medical Decision Making Problem Points:  Review of psycho-social stressors (1) Data Points:  Review of medication regiment & side effects (2) Review of new medications or change in dosage (2)  I certify that inpatient services furnished can reasonably be  expected to improve the patient's condition.   Armandina Stammer I, PMHNP-BC 06/21/2013, 3:26 PM

## 2013-06-21 NOTE — B[facility] Group Notes (Signed)
[facility] LCSW Group Therapy  06/21/2013 3:25 PM  Type of Therapy:  Group Therapy  Participation Level:  Active  Participation Quality:  Attentive  Affect:  Appropriate  Cognitive:  Alert  Insight:  Engaged  Engagement in Therapy:  Engaged  Modes of Intervention:  Discussion, Education, Exploration, Socialization and Support  Summary of Progress/Problems: MHA Speaker came to talk about his personal journey with substance abuse and addiction. The pt processed ways by which to relate to the speaker. MHA speaker provided handouts and educational information pertaining to groups and services offered by the [facility]. Nathaniel Warren was attentive and engaged throughout the day's group. He asked the speaker questions about his personal experience with MI and SA. Nathaniel Warren also asked about specific groups offered by [facility]. He participated in group discussion about MI and SA, showing progress in terms of insight and ability to connect information obtained to his personal situation.   Smart, [name] 06/21/2013, 3:25 PM

## 2013-06-22 DIAGNOSIS — F329 Major depressive disorder, single episode, unspecified: Secondary | ICD-10-CM

## 2013-06-22 MED ORDER — HYDROXYZINE HCL 25 MG PO TABS
25.0000 mg | ORAL_TABLET | Freq: Four times a day (QID) | ORAL | Status: DC | PRN
Start: 2013-06-22 — End: 2013-06-23
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
## 2013-06-22 NOTE — Progress Notes (Signed)
Patient ID: Nathaniel Warren, male   DOB: 1966/05/26, 47 y.o.   MRN: [id number] Pt did not attend group, asleep in bed.
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
## 2013-06-23 DIAGNOSIS — F332 Major depressive disorder, recurrent severe without psychotic features: Principal | ICD-10-CM

## 2013-06-23 MED ORDER — BUSPIRONE HCL 10 MG PO TABS: 10.0000 mg | ORAL_TABLET | Freq: Three times a day (TID) | ORAL | Status: DC

## 2013-06-23 MED ORDER — HYDROXYZINE HCL 25 MG PO TABS: 25.0000 mg | ORAL_TABLET | Freq: Four times a day (QID) | ORAL | Status: DC | PRN

## 2013-06-23 MED ORDER — QUETIAPINE FUMARATE 50 MG PO TABS: 50.0000 mg | ORAL_TABLET | Freq: Three times a day (TID) | ORAL | Status: DC

## 2013-06-23 MED ORDER — HYDROXYZINE HCL 25 MG PO TABS
25.0000 mg | ORAL_TABLET | Freq: Two times a day (BID) | ORAL | Status: DC | PRN
  Filled 2013-06-23: qty 28

## 2013-06-23 MED ORDER — TRAZODONE HCL 50 MG PO TABS: 50.0000 mg | ORAL_TABLET | Freq: Every evening | ORAL | Status: DC | PRN

## 2013-06-23 MED ORDER — HYDROXYZINE HCL 25 MG PO TABS
25.0000 mg | ORAL_TABLET | Freq: Two times a day (BID) | ORAL | Status: DC | PRN
  Filled 2013-06-23: qty 1

## 2013-06-23 MED ORDER — HYDROXYZINE HCL 25 MG PO TABS: 25.0000 mg | ORAL_TABLET | Freq: Two times a day (BID) | ORAL | Status: DC | PRN

## 2013-06-23 MED ORDER — GABAPENTIN 300 MG PO CAPS: 600.0000 mg | ORAL_CAPSULE | Freq: Three times a day (TID) | ORAL | Status: DC

## 2013-06-23 MED ORDER — PAROXETINE HCL 20 MG PO TABS: 20.0000 mg | ORAL_TABLET | Freq: Every day | ORAL | Status: DC

## 2013-06-23 MED ORDER — QUETIAPINE FUMARATE 200 MG PO TABS: 200.0000 mg | ORAL_TABLET | Freq: Every day | ORAL | Status: DC

## 2013-06-23 NOTE — [facility] Group Notes (Signed)
[facility] LCSW Aftercare Discharge Planning Group Note   06/23/2013 9:42 AM  Participation Quality:  Appropriate   Mood/Affect:  Appropriate  Depression Rating:  8  Anxiety Rating:  9  Thoughts of Suicide:  No Will you contract for safety?   NA  Current AVH:  No  Plan for Discharge/Comments:  Pt has [facility] date of 7/22. He plans to stay with a friend until admission into [facility]. He plans to follow up at [facility] for med management. Pt informed CSW that he has possible court date and needs letter for [place] court faxed. Pt also asked that CSW contact his probation officer to inform her about plan.   Transportation Means: bus/bus pass given to pt.   Supports: family/some friends  Counselling psychologist, Research scientist (physical sciences)
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
## 2013-06-23 NOTE — Progress Notes (Addendum)
D:  Patient's self inventory sheet, patient has fair sleep, improving appetite, low energy level, poor attention span.  Rated depression #8, hopelessness #9.  Denied withdrawals.  SI, contracts for safety.  Has experienced pain in past 24 hours.  Pain goal #2, worst pain #6.  After therapist, plans to see therapist, stay on meds.  No discharge plans.  No problems taking meds after discharge. A:  Medications administered per MD orders.  Emotional support and encouragement given patient. R:  Denied HI.   Denied A/V hallucinations.  SI, contracts for safety.  Will continue to monitor patient for safety with 15 minute checks.  Safety maintained.  Safety zone completed on patient, lost polo shirt possibly in laundry.  Patient was reimbursed $37.50 per ST.

## 2013-06-23 NOTE — [facility] Suicide Risk Assessment (Signed)
Suicide Risk Assessment  Discharge Assessment     Demographic Factors:  Male and Caucasian  Mental Status Per Nursing Assessment::   On Admission:  Suicide plan;Self-harm thoughts  Current Mental Status by Physician: In full contact with reality. There are no suicidal ideas, plans or intent. His mood is euthymic, His affect is appropriate. He is willing and motivated to pursue further rehab treatment. He has an appointment to go to [facility] next Tuesday. He states he is going to be [facility] outside waiting for that bed.   Loss Factors: Decline in physical health  Historical Factors: NA  Risk Reduction Factors:   wanting to do better  Continued Clinical Symptoms:  Alcohol/Substance Abuse/Dependencies  Cognitive Features That Contribute To Risk:  Polarized thinking Thought constriction (tunnel vision)    Suicide Risk:  Minimal: No identifiable suicidal ideation.  Patients presenting with no risk factors but with morbid ruminations; may be classified as minimal risk based on the severity of the depressive symptoms  Discharge Diagnoses:   AXIS I:  Alcohol Dependence, Major Depression, GAD AXIS II:  Deferred AXIS III:   Past Medical History  Diagnosis Date  . Stroke   . Seizures   . Bipolar 1 disorder   . Depression    AXIS IV:  housing problems and other psychosocial or environmental problems AXIS V:  61-70 mild symptoms  Plan Of Care/Follow-up recommendations:  Activity:  as tolerated Diet:  regular Follow up [facility] Is patient on multiple antipsychotic therapies at discharge:  No   Has Patient had three or more failed trials of antipsychotic monotherapy by history:  No  Recommended Plan for Multiple Antipsychotic Therapies: N/A   Kaylan Yates A 06/23/2013, 1:21 PM

## 2013-06-23 NOTE — Discharge Summary (Signed)
Physician Discharge Summary Note  Patient:  Nathaniel Warren is an 47 y.o., male MRN:  [id number] DOB:  [birth year] Patient phone:  [phone] (home)  Patient address:   [address] [place] [address],   Date of Admission:  06/19/2013  Date of Discharge: 06/23/13  Reason for Admission:  Suicidal ideation, Major depression, alcoholism  Discharge Diagnoses: Principal Problem:   Alcohol dependence Active Problems:   MDD (major depressive disorder), recurrent episode, severe   GAD (generalized anxiety disorder)  Review of Systems  Constitutional: Negative.   HENT: Negative.   Eyes: Negative.   Respiratory: Negative.   Cardiovascular: Negative.   Gastrointestinal: Negative.   Genitourinary: Negative.   Musculoskeletal: Negative.   Skin: Negative.   Neurological: Negative.   Endo/Heme/Allergies: Negative.   Psychiatric/Behavioral: Positive for depression (Stabilized with medication prior to discharge) and substance abuse (Alcoholism). Negative for suicidal ideas, hallucinations and memory loss. The patient is nervous/anxious (Stabilized with medication prior to discharge) and has insomnia (Stabilized with medication prior to discharge).    Axis Diagnosis:   AXIS I:  Generalized Anxiety Disorder and Alcohol dependence, MDD (major depressive disorder), recurrent episode, severe AXIS II:  Deferred AXIS III:   Past Medical History  Diagnosis Date  . Stroke   . Seizures   . Bipolar 1 disorder   . Depression    AXIS IV:  other psychosocial or environmental problems and Substance dependence AXIS V:  63  Level of Care:  [facility]  Hospital Course: This is a 47 year old Caucasian male. Admitted to [facility] from the [facility] ED with reports of taking a handful of his Buspar and Vistaril pills with a 40 ounce bottle of beer. Patient reports, "I went to the [facility] ED yesterday. I was feeling suicidal because I'm very depressed. It started about 2 weeks ago. I  don't know why I stay depressed. I got to be feeling fed-up about feeling depressed, so I took a handful of my Buspar and vistaril pills and a bunch of alcohol together to kill myself. I passed out, woke up and went to the ED. I was in this hospital and got discharged about 2 months ago. I was not abusing any drugs and or alcohol like every body was thinking. I see a doctor at [facility], but I don't believe it is enough. I need talk therapy. A therapist that I can talk to about my depression and anxiety. I have a lot of anxiety, panic attacks and depression. I need to get back on my Seroquel, Neurontin etc. I have bipolar mental illness".  Upon admission into this hospital, and after admission assessment/evaluation, it was determined that patient will need detoxification treatment to stabilize his system of drug intoxication and to combat the withdrawal symptoms of alcohol as well. And his discharge plans included a referral to a long term treatment facility for more intense substance abuse treatment. [name] was then started on Librium protocol for his alcohol detoxification. He was also enrolled in group counseling sessions and activities where he counseled, taught and learned coping skills that should help him after discharge to cope better, manage his substance abuse problems to maintain a much longer sobriety. He also was enrolled and attended AA/NA meetings being offered and held on this unit. He has no previous and or identifiable medical conditions that required treatment and or monitoring. He was monitored closely for any potential problems that may arise as a result of and or during detoxification treatment. Patient  tolerated his detoxification treatment without any significant adverse effects and or reactions presented.  Patient attended treatment team meeting this am and met with the team. His symptoms, substance abuse issues, response to treatment and discharge plans discussed. Patient endorsed  that he is doing well and stable for discharge to pursue the next phase of his substance abuse treatment. It was then agreed upon that he will continue substance abuse treatment at the [facility] in [place], [place] on 06/28/13 at 08:00 am. And for routine psychiatric care.medication management, he will follow-up at the [facility] here in [place], [place]. Patient has been informed that this is a walk-in appointment between the hours of 08:00 - 09:00 am Monday thru Friday. The address, date, time and contact information for the treatment cent and [facility] clinic provided for patient in writing.   Besides detoxification treatment protocol, [name] also was ordered and received Buspar 10 mg tid for anxiety, Gabapentin 300 mg tid for anxiety, Paxil 20 mg for depression, Seroquel 50 mg tid for anxiety, Seroquel 200 mg Q bedtime for mood control and Trazodone 50 mg Q bedtime for sleep. He also was encouraged to join/attend AA/NA meetings being offered and held within his community.  Upon discharge, patient adamantly denies suicidal, homicidal ideations, auditory, visual hallucinations, delusional thinking and or withdrawal symptoms. Patient left [facility] with all personal belongings in no apparent distress. He received 2 weeks worth samples of his discharge medications. Transportation per [name]. [facility] pass provided by [facility].   Consults:  psychiatry  Significant Diagnostic Studies:  labs: CBC with diff, CMP, UDS, Toxicology tests, U/A  Discharge Vitals:   Blood pressure 106/71, pulse 62, temperature 97.5 F (36.4 C), temperature source Oral, resp. rate 16. There is no weight on file to calculate BMI. Lab Results:   No results found for this or any previous visit (from the past 72 hour(s)).  Physical Findings: AIMS: Facial and Oral Movements Muscles of Facial Expression: None, normal Lips and Perioral Area: None, normal Jaw: None, normal Tongue: None, normal,Extremity Movements Upper (arms,  wrists, hands, fingers): None, normal Lower (legs, knees, ankles, toes): None, normal, Trunk Movements Neck, shoulders, hips: None, normal, Overall Severity Severity of abnormal movements (highest score from questions above): None, normal Incapacitation due to abnormal movements: None, normal Patient's awareness of abnormal movements (rate only patient's report): No Awareness, Dental Status Current problems with teeth and/or dentures?: No Does patient usually wear dentures?: No  CIWA:  CIWA-Ar Total: 4 COWS:     Psychiatric Specialty Exam: See Psychiatric Specialty Exam and Suicide Risk Assessment completed by Attending Physician prior to discharge.  Discharge destination:  Home  Is patient on multiple antipsychotic therapies at discharge:  No   Has Patient had three or more failed trials of antipsychotic monotherapy by history:  No  Recommended Plan for Multiple Antipsychotic Therapies: NA     Medication List    STOP taking these medications       clonazePAM 1 MG tablet  Commonly known as:  KLONOPIN     sertraline 100 MG tablet  Commonly known as:  ZOLOFT      TAKE these medications     Indication   busPIRone 10 MG tablet  Commonly known as:  BUSPAR  Take 1 tablet (10 mg total) by mouth 3 (three) times daily. For anxiety symptoms   Indication:  Anxiety Disorder, Symptoms of Feeling Anxious, Generalized Anxiety Disorder     gabapentin 300 MG capsule  Commonly known as:  NEURONTIN  Take 2 capsules (600 mg total) by mouth 3 (three) times daily. For anxiety symptoms   Indication:  Agitation, Nerve Pain After Herpes Zoster or Shingles, Anxiety symptoms     hydrOXYzine 25 MG tablet  Commonly known as:  ATARAX/VISTARIL  Take 1 tablet (25 mg total) by mouth 2 (two) times daily as needed for anxiety.   Indication:  Anxiety associated with Organic Disease, Anxiety symptoms     PARoxetine 20 MG tablet  Commonly known as:  PAXIL  Take 1 tablet (20 mg total) by mouth daily.  For depression   Indication:  Major Depressive Disorder, Panic Disorder     QUEtiapine 200 MG tablet  Commonly known as:  SEROQUEL  Take 1 tablet (200 mg total) by mouth at bedtime. For mood control   Indication:  Depressive Phase of Manic-Depression, Manic Phase of Manic-Depression     QUEtiapine 50 MG tablet  Commonly known as:  SEROQUEL  Take 1 tablet (50 mg total) by mouth 3 (three) times daily. For anxiety   Indication:  Anxiety     traZODone 50 MG tablet  Commonly known as:  DESYREL  Take 1 tablet (50 mg total) by mouth at bedtime as needed for sleep.   Indication:  Trouble Sleeping       Follow-up Information   Follow up with [facility] On 06/28/2013. (Arrive at 8AM with ID, 30 day medication supply, and clothing. )    Contact information:   [address] [place], [place] [address] phone: [phone] fax: [phone]      Follow up with [facility]. (Walk in between 8am-9am Monday through Friday for hospital follow-up/medication management.)    Contact information:   [address], [place] [address] phone: [phone] fax: [phone]     Follow-up recommendations: Activity:  As tolerated Diet: As recommended by your primary care doctor. Keep all scheduled follow-up appointments as recommended.   Continue work your relapse prevention plan Comments: Take all your medications as prescribed by your mental healthcare provider. Report any adverse effects and or reactions from your medicines to your outpatient provider promptly. Patient is instructed and cautioned to not engage in alcohol and or illegal drug use while on prescription medicines. In the event of worsening symptoms, patient is instructed to call the crisis hotline, 911 and or go to the nearest ED for appropriate evaluation and treatment of symptoms. Follow-up with your primary care provider for your other medical issues, concerns and or health care needs.   Total Discharge Time:  Greater than  30 minutes.  Signed: Sanjuana Kava, PMHNP-BC 06/23/2013, 9:40 AM Agree with assessment and plan Reymundo Poll. Dub Mikes, M.D.

## 2013-06-23 NOTE — Discharge Planning (Signed)
Pt asked CSW to fax letter to [facility]. Court stating that he had been at [facility] and that he has [facility] admit date of 7/22. Hannah Nail was witness as pt confirmed that he was consenting to this fax being sent specifically stating that he had been at [facility] and was planning to go to [facility]. Fax sent 10:10AM

## 2013-06-23 NOTE — Progress Notes (Signed)
[facility] Adult Case Management Discharge Plan :  Will you be returning to the same living situation after discharge: No. At discharge, do you have transportation home?:Yes,  bus pass Do you have the ability to pay for your medications:Yes,  mental health  Release of information consent forms completed and in the chart;  Patient's signature needed at discharge.  Patient to Follow up at: Follow-up Information   Follow up with [facility] On 06/28/2013. (Arrive at 8AM with ID, 30 day medication supply, and clothing. )    Contact information:   [address] [place], [place] [address] phone: [phone] fax: [phone]      Follow up with [facility]. (Walk in between 8am-9am Monday through Friday for hospital follow-up/medication management.)    Contact information:   [address], [place] [address] phone: [phone] fax: [phone]      Patient denies SI/HI:   Yes,  during group/self report    Safety Planning and Suicide Prevention discussed:  Yes,  pt refused to consent to family contact for SPE. SPE completed with pt. Pt given SPI pamphlet and encouraged to ask questions/voice concerns.   Smart, Demetrio Leighty 06/23/2013, 10:28 AM

## 2013-06-23 NOTE — Progress Notes (Signed)
Adult Psychoeducational Group Note  Date:  06/23/2013 Time:  6:50 PM  Group Topic/Focus:  Overcoming Stress:   The focus of this group is to define stress and help patients assess their triggers.  Participation Level:  Active  Participation Quality:  Appropriate and Attentive  Affect:  Appropriate  Cognitive:  Appropriate  Insight: Appropriate and Good  Engagement in Group:  Engaged  Modes of Intervention:  Confrontation, Discussion, Education, Socialization and Support  Additional Comments:  Nathaniel Warren attended and shared during group. Patient defined stress in own terms. Patient completed overcoming stress interview with a peer in the group. Patient was asked what stresses patient out the most, patient explained what changes could be made to decrease stress in patient life. Patient was asked to complete form on stress management ideas during personal development time.    Nathaniel Warren 06/23/2013, 6:50 PM

## 2013-06-23 NOTE — Progress Notes (Signed)
Discharge Note:  Patient discharged with bus pass.  Denied SI and HI.   Denied A/V hallucinations.  Denied pain.  Patient received all his belongings, clothing, hat, tennis shoes, medications, prescriptions, toiletries, miscellaneous items.  Suicide prevention information given and discussed with patient who stated he understood and had no questions.  Patient stated he appreciated all assistance received from [facility] staff.  Patient took the money with him that he received from loss of his shirt.

## 2013-06-28 NOTE — Progress Notes (Signed)
Patient Discharge Instructions:  After Visit Summary (AVS):   Faxed to:  06/28/13 Discharge Summary Note:   Faxed to:  06/28/13 Psychiatric Admission Assessment Note:   Faxed to:  06/28/13 Suicide Risk Assessment - Discharge Assessment:   Faxed to:  06/28/13 Faxed/Sent to the Next Level Care provider:  06/28/13 Faxed to [facility] @ [phone] Faxed to [facility] @ [phone]  Jerelene Redden, 06/28/2013, 3:38 PM

## 2013-08-15 ENCOUNTER — Encounter (HOSPITAL_BASED_OUTPATIENT_CLINIC_OR_DEPARTMENT_OTHER): Payer: Self-pay

## 2013-08-15 ENCOUNTER — Emergency Department (HOSPITAL_BASED_OUTPATIENT_CLINIC_OR_DEPARTMENT_OTHER)
Admission: EM | Admit: 2013-08-15 | Discharge: 2013-08-15 | Disposition: A | Discharge status: Home / Self Care | Payer: Self-pay | Attending: Emergency Medicine | Admitting: Emergency Medicine

## 2013-08-15 DIAGNOSIS — J209 Acute bronchitis, unspecified: Secondary | ICD-10-CM | POA: Insufficient documentation

## 2013-08-15 DIAGNOSIS — J40 Bronchitis, not specified as acute or chronic: Secondary | ICD-10-CM

## 2013-08-15 DIAGNOSIS — Z8673 Personal history of transient ischemic attack (TIA), and cerebral infarction without residual deficits: Secondary | ICD-10-CM | POA: Insufficient documentation

## 2013-08-15 DIAGNOSIS — F17200 Nicotine dependence, unspecified, uncomplicated: Secondary | ICD-10-CM | POA: Insufficient documentation

## 2013-08-15 DIAGNOSIS — F319 Bipolar disorder, unspecified: Secondary | ICD-10-CM | POA: Insufficient documentation

## 2013-08-15 DIAGNOSIS — R509 Fever, unspecified: Secondary | ICD-10-CM | POA: Insufficient documentation

## 2013-08-15 DIAGNOSIS — Z79899 Other long term (current) drug therapy: Secondary | ICD-10-CM | POA: Insufficient documentation

## 2013-08-15 DIAGNOSIS — G40909 Epilepsy, unspecified, not intractable, without status epilepticus: Secondary | ICD-10-CM | POA: Insufficient documentation

## 2013-08-15 DIAGNOSIS — R51 Headache: Secondary | ICD-10-CM | POA: Insufficient documentation

## 2013-08-15 MED ORDER — PSEUDOEPHEDRINE HCL ER 120 MG PO TB12: 120.0000 mg | ORAL_TABLET | Freq: Two times a day (BID) | ORAL | Status: DC

## 2013-08-15 MED ORDER — AZITHROMYCIN 250 MG PO TABS: 500.0000 mg | ORAL_TABLET | Freq: Once | ORAL | Status: DC

## 2013-08-15 MED ORDER — ALBUTEROL SULFATE HFA 108 (90 BASE) MCG/ACT IN AERS: 2.0000 | INHALATION_SPRAY | RESPIRATORY_TRACT | Status: DC | PRN

## 2013-08-15 NOTE — ED Provider Notes (Signed)
CSN: [id number]     Arrival date & time 08/15/13  1123 History   First MD Initiated Contact with Patient 08/15/13 1142     Chief Complaint  Patient presents with  . URI   (Consider location/radiation/quality/duration/timing/severity/associated sxs/prior Treatment) HPI Comments: Patient here from [facility] where he reports about a 24 hour period of facial pain, nasal congestion, fever, runny nose, cough with green sputum production and audible wheezing.  He states that he has tried mucinex, robitussin and ibuprofen without relief of the symptoms.  He reports he is a smoker.  Patient is a 47 y.o. male presenting with URI. The history is provided by the patient. No language interpreter was used.  URI Presenting symptoms: congestion, cough, facial pain, fever and rhinorrhea   Presenting symptoms: no ear pain, no fatigue and no sore throat   Severity:  Moderate Onset quality:  Gradual Duration:  24 hours Timing:  Constant Progression:  Worsening Chronicity:  New Relieved by:  Nothing Worsened by:  Certain positions Ineffective treatments:  Certain positions and inhaler Associated symptoms: myalgias, sinus pain and wheezing   Associated symptoms: no arthralgias, no headaches, no neck pain, no sneezing and no swollen glands     Past Medical History  Diagnosis Date  . Stroke   . Seizures   . Bipolar 1 disorder   . Depression    Past Surgical History  Procedure Laterality Date  . Ankle surgery    . Splenectomy     No family history on file. History  Substance Use Topics  . Smoking status: Current Every Day Smoker -- 1.50 packs/day for 30 years    Types: Cigarettes  . Smokeless tobacco: Not on file  . Alcohol Use: No    Review of Systems  Constitutional: Positive for fever. Negative for fatigue.  HENT: Positive for congestion and rhinorrhea. Negative for ear pain, sore throat, sneezing and neck pain.   Respiratory: Positive for cough and wheezing.   Musculoskeletal: Positive  for myalgias. Negative for arthralgias.  Neurological: Negative for headaches.  All other systems reviewed and are negative.    Allergies  Asa  Home Medications   Current Outpatient Rx  Name  Route  Sig  Dispense  Refill  . busPIRone (BUSPAR) 10 MG tablet   Oral   Take 1 tablet (10 mg total) by mouth 3 (three) times daily. For anxiety symptoms   90 tablet   0   . gabapentin (NEURONTIN) 300 MG capsule   Oral   Take 2 capsules (600 mg total) by mouth 3 (three) times daily. For anxiety symptoms   180 capsule   0   . hydrOXYzine (ATARAX/VISTARIL) 25 MG tablet   Oral   Take 1 tablet (25 mg total) by mouth 2 (two) times daily as needed for anxiety.   60 tablet   0   . PARoxetine (PAXIL) 20 MG tablet   Oral   Take 1 tablet (20 mg total) by mouth daily. For depression   30 tablet   0   . QUEtiapine (SEROQUEL) 200 MG tablet   Oral   Take 1 tablet (200 mg total) by mouth at bedtime. For mood control   30 tablet   0   . QUEtiapine (SEROQUEL) 50 MG tablet   Oral   Take 1 tablet (50 mg total) by mouth 3 (three) times daily. For anxiety   90 tablet   0   . traZODone (DESYREL) 50 MG tablet   Oral   Take 1 tablet (  50 mg total) by mouth at bedtime as needed for sleep.   30 tablet   0    BP 99/67  Pulse 80  Temp(Src) 97.8 F (36.6 C) (Oral)  Resp 20  Ht 5\' 9"  (1.753 m)  Wt 188 lb (85.276 kg)  BMI 27.75 kg/m2  SpO2 96% Physical Exam  Nursing note and vitals reviewed. Constitutional: He is oriented to person, place, and time. He appears well-developed and well-nourished. No distress.  HENT:  Head: Normocephalic and atraumatic.  Right Ear: External ear normal.  Left Ear: External ear normal.  Mouth/Throat: Oropharynx is clear and moist.  Nasal mucosa boggy - bilateral maxillary sinus tenderness with inability to transilluminate  Eyes: Conjunctivae are normal. Pupils are equal, round, and reactive to light. No scleral icterus.  Neck: Normal range of motion. Neck  supple.  Cardiovascular: Normal rate, regular rhythm and normal heart sounds.  Exam reveals no gallop and no friction rub.   No murmur heard. Pulmonary/Chest: Effort normal. No respiratory distress. He has no wheezes. He has no rales. He exhibits no tenderness.  Abdominal: Soft. Bowel sounds are normal. He exhibits no distension and no mass. There is no tenderness. There is no rebound and no guarding.  Musculoskeletal: Normal range of motion. He exhibits no edema and no tenderness.  Lymphadenopathy:    He has no cervical adenopathy.  Neurological: He is alert and oriented to person, place, and time. No cranial nerve deficit.  Skin: Skin is warm and dry. No rash noted. No erythema. No pallor.  Psychiatric: He has a normal mood and affect. His behavior is normal. Judgment and thought content normal.    ED Course  Procedures (including critical care time) Labs Review Labs Reviewed - No data to display Imaging Review No results found.  MDM  Bronchitis  Patient is a smoker with purulent sputum - no wheezing noted but plan on placing on nasal decongestant, oral abx and inhalder.    Izola Price Marisue Humble, PA-C 08/15/13 1219

## 2013-08-15 NOTE — ED Notes (Addendum)
C/o facial,chest,back pain,fever, prod cough x 2 days-[facility] resident-rehab tx x 26 days for ETOH-per [facility] MAR pt completed amoxil 08/12/13-was given ibuprofen, mucinex and robitussin prn

## 2013-08-15 NOTE — ED Provider Notes (Signed)
Medical screening examination/treatment/procedure(s) were performed by non-physician practitioner and as supervising physician I was immediately available for consultation/collaboration.   Charles B. Bernette Mayers, MD 08/15/13 2133

## 2013-09-05 ENCOUNTER — Emergency Department (HOSPITAL_BASED_OUTPATIENT_CLINIC_OR_DEPARTMENT_OTHER)
Admission: EM | Admit: 2013-09-05 | Discharge: 2013-09-05 | Disposition: A | Discharge status: Home / Self Care | Payer: Self-pay | Attending: Emergency Medicine | Admitting: Emergency Medicine

## 2013-09-05 ENCOUNTER — Emergency Department (HOSPITAL_BASED_OUTPATIENT_CLINIC_OR_DEPARTMENT_OTHER): Payer: Self-pay

## 2013-09-05 ENCOUNTER — Encounter (HOSPITAL_BASED_OUTPATIENT_CLINIC_OR_DEPARTMENT_OTHER): Payer: Self-pay

## 2013-09-05 DIAGNOSIS — X500XXA Overexertion from strenuous movement or load, initial encounter: Secondary | ICD-10-CM | POA: Insufficient documentation

## 2013-09-05 DIAGNOSIS — Z79899 Other long term (current) drug therapy: Secondary | ICD-10-CM | POA: Insufficient documentation

## 2013-09-05 DIAGNOSIS — F319 Bipolar disorder, unspecified: Secondary | ICD-10-CM | POA: Insufficient documentation

## 2013-09-05 DIAGNOSIS — Z8673 Personal history of transient ischemic attack (TIA), and cerebral infarction without residual deficits: Secondary | ICD-10-CM | POA: Insufficient documentation

## 2013-09-05 DIAGNOSIS — M79671 Pain in right foot: Secondary | ICD-10-CM

## 2013-09-05 DIAGNOSIS — Y9389 Activity, other specified: Secondary | ICD-10-CM | POA: Insufficient documentation

## 2013-09-05 DIAGNOSIS — Z8669 Personal history of other diseases of the nervous system and sense organs: Secondary | ICD-10-CM | POA: Insufficient documentation

## 2013-09-05 DIAGNOSIS — S93409A Sprain of unspecified ligament of unspecified ankle, initial encounter: Secondary | ICD-10-CM | POA: Insufficient documentation

## 2013-09-05 DIAGNOSIS — F17200 Nicotine dependence, unspecified, uncomplicated: Secondary | ICD-10-CM | POA: Insufficient documentation

## 2013-09-05 DIAGNOSIS — Y929 Unspecified place or not applicable: Secondary | ICD-10-CM | POA: Insufficient documentation

## 2013-09-05 DIAGNOSIS — S93401A Sprain of unspecified ligament of right ankle, initial encounter: Secondary | ICD-10-CM

## 2013-09-05 MED ORDER — ACETAMINOPHEN 325 MG PO TABS: 650.0000 mg | ORAL_TABLET | Freq: Four times a day (QID) | ORAL | Status: DC | PRN

## 2013-09-05 MED ORDER — ACETAMINOPHEN 325 MG PO TABS
650.0000 mg | ORAL_TABLET | Freq: Once | ORAL | Status: AC
  Administered 2013-09-05: 650 mg via ORAL
  Filled 2013-09-05: qty 2

## 2013-09-05 NOTE — ED Provider Notes (Signed)
CSN: [id number]     Arrival date & time 09/05/13  4098 History   First MD Initiated Contact with Patient 09/05/13 [phone]     Chief Complaint  Patient presents with  . Foot Pain  . Ankle Pain   (Consider location/radiation/quality/duration/timing/severity/associated sxs/prior Treatment) HPI Comments: Pt states that he stepped off a brick wall and turned his ankle about 2 hours ago:pt states that he felt a pop:pt states that he has had previous surgery to the ankle:pt denies any swelling:pt has not taken anything for pain:denies numbness or swelling to the area  The history is provided by the patient. No language interpreter was used.    Past Medical History  Diagnosis Date  . Stroke   . Seizures   . Bipolar 1 disorder   . Depression    Past Surgical History  Procedure Laterality Date  . Ankle surgery    . Splenectomy     No family history on file. History  Substance Use Topics  . Smoking status: Current Every Day Smoker -- 1.00 packs/day for 30 years    Types: Cigarettes  . Smokeless tobacco: Not on file  . Alcohol Use: No     Comment: lasr drink 06/19/13 Presently in [facility]    Review of Systems  Constitutional: Negative.   Respiratory: Negative.   Cardiovascular: Negative.     Allergies  Asa  Home Medications   Current Outpatient Rx  Name  Route  Sig  Dispense  Refill  . acetaminophen (TYLENOL) 325 MG tablet   Oral   Take 2 tablets (650 mg total) by mouth every 6 (six) hours as needed for pain.   30 tablet   0   . albuterol (PROVENTIL HFA;VENTOLIN HFA) 108 (90 BASE) MCG/ACT inhaler   Inhalation   Inhale 2 puffs into the lungs every 4 (four) hours as needed for wheezing.   1 Inhaler   0   . busPIRone (BUSPAR) 10 MG tablet   Oral   Take 1 tablet (10 mg total) by mouth 3 (three) times daily. For anxiety symptoms   90 tablet   0   . gabapentin (NEURONTIN) 300 MG capsule   Oral   Take 2 capsules (600 mg total) by mouth 3 (three) times daily. For anxiety  symptoms   180 capsule   0   . hydrOXYzine (ATARAX/VISTARIL) 25 MG tablet   Oral   Take 1 tablet (25 mg total) by mouth 2 (two) times daily as needed for anxiety.   60 tablet   0   . PARoxetine (PAXIL) 20 MG tablet   Oral   Take 1 tablet (20 mg total) by mouth daily. For depression   30 tablet   0   . QUEtiapine (SEROQUEL) 200 MG tablet   Oral   Take 1 tablet (200 mg total) by mouth at bedtime. For mood control   30 tablet   0   . QUEtiapine (SEROQUEL) 50 MG tablet   Oral   Take 1 tablet (50 mg total) by mouth 3 (three) times daily. For anxiety   90 tablet   0   . traZODone (DESYREL) 50 MG tablet   Oral   Take 1 tablet (50 mg total) by mouth at bedtime as needed for sleep.   30 tablet   0    BP 117/74  Pulse 92  Temp(Src) 98 F (36.7 C) (Oral)  Resp 16  Ht 5\' 9"  (1.753 m)  Wt 188 lb (85.276 kg)  BMI 27.75 kg/m2  SpO2 95% Physical Exam  Nursing note and vitals reviewed. Constitutional: He is oriented to person, place, and time. He appears well-developed and well-nourished.  Cardiovascular: Normal rate and regular rhythm.   Pulmonary/Chest: Effort normal and breath sounds normal.  Musculoskeletal: Normal range of motion.  No swelling or deformity noted:pt tender to palpation on the lateral right ankle  Neurological: He is alert and oriented to person, place, and time.  Skin: Skin is warm and dry.    ED Course  Procedures (including critical care time) Labs Review Labs Reviewed - No data to display Imaging Review Dg Ankle Complete Right  09/05/2013   CLINICAL DATA:  Ankle pain post injury this morning  EXAM: RIGHT ANKLE - COMPLETE 3+ VIEW  COMPARISON:  02/10/2011. Marland Kitchen  FINDINGS: Three views of the right ankle submitted. Ankle mortise is preserved. Two metallic fixation screws in distal tibia medial malleolus again noted. Metallic fixation plate and screws in distal fibula again noted. Tiny plantar spur of calcaneus. Ankle mortise is preserved. No acute fracture  or subluxation.  IMPRESSION: No acute fracture or subluxation. Stable postsurgical changes distal tibia and fibula.   Electronically Signed   By: Natasha Mead   On: 09/05/2013 10:02   Dg Foot Complete Right  09/05/2013   CLINICAL DATA:  Lateral ankle pain, rolled ankle this morning  EXAM: RIGHT FOOT COMPLETE - 3+ VIEW  COMPARISON:  02/10/2011  FINDINGS: Three views of the right foot submitted. No acute fracture or subluxation. Stable metallic fixation material distal fibula and distal tibia medial malleolus. A small bony fragment adjacent to dorsal aspect of the talus well corticated is stable from prior exam probable from prior injury. No acute fracture or subluxation. Stable degenerative changes 1st metatarsal phalangeal joint.  IMPRESSION: No acute fracture or subluxation. Stable postoperative changes.   Electronically Signed   By: Natasha Mead   On: 09/05/2013 10:01    MDM   1. Ankle sprain, right, initial encounter   2. Foot pain, right    No acute injury noted:pt splinted for comfort:pt has crutches    Teressa Lower, NP 09/05/13 1015

## 2013-09-05 NOTE — ED Notes (Signed)
Patient transported to X-ray 

## 2013-09-05 NOTE — ED Notes (Signed)
Pt reports right ankle and foot pain that started this am after "rolling' his ankle.

## 2013-09-06 NOTE — ED Provider Notes (Signed)
Medical screening examination/treatment/procedure(s) were performed by non-physician practitioner and as supervising physician I was immediately available for consultation/collaboration.  Toy Baker, MD 09/06/13 [phone]

## 2013-10-04 ENCOUNTER — Encounter (HOSPITAL_BASED_OUTPATIENT_CLINIC_OR_DEPARTMENT_OTHER): Payer: Self-pay | Admitting: Emergency Medicine

## 2013-10-04 ENCOUNTER — Emergency Department (HOSPITAL_BASED_OUTPATIENT_CLINIC_OR_DEPARTMENT_OTHER): Payer: Self-pay

## 2013-10-04 ENCOUNTER — Emergency Department (HOSPITAL_BASED_OUTPATIENT_CLINIC_OR_DEPARTMENT_OTHER)
Admission: EM | Admit: 2013-10-04 | Discharge: 2013-10-04 | Disposition: A | Discharge status: Home / Self Care | Payer: Self-pay | Attending: Emergency Medicine | Admitting: Emergency Medicine

## 2013-10-04 DIAGNOSIS — Z8673 Personal history of transient ischemic attack (TIA), and cerebral infarction without residual deficits: Secondary | ICD-10-CM | POA: Insufficient documentation

## 2013-10-04 DIAGNOSIS — R200 Anesthesia of skin: Secondary | ICD-10-CM

## 2013-10-04 DIAGNOSIS — R209 Unspecified disturbances of skin sensation: Secondary | ICD-10-CM | POA: Insufficient documentation

## 2013-10-04 DIAGNOSIS — F17200 Nicotine dependence, unspecified, uncomplicated: Secondary | ICD-10-CM | POA: Insufficient documentation

## 2013-10-04 DIAGNOSIS — G40909 Epilepsy, unspecified, not intractable, without status epilepticus: Secondary | ICD-10-CM | POA: Insufficient documentation

## 2013-10-04 DIAGNOSIS — F319 Bipolar disorder, unspecified: Secondary | ICD-10-CM | POA: Insufficient documentation

## 2013-10-04 DIAGNOSIS — R51 Headache: Secondary | ICD-10-CM | POA: Insufficient documentation

## 2013-10-04 DIAGNOSIS — Z79899 Other long term (current) drug therapy: Secondary | ICD-10-CM | POA: Insufficient documentation

## 2013-10-04 LAB — BASIC METABOLIC PANEL
BUN: 22 mg/dL (ref 6–23)
Calcium: 9.6 mg/dL (ref 8.4–10.5)
GFR calc non Af Amer: 72 mL/min — ABNORMAL LOW (ref >90)
Glucose, Bld: 106 mg/dL — ABNORMAL HIGH (ref 70–99)
Potassium: 4.3 mEq/L (ref 3.5–5.1)

## 2013-10-04 LAB — CBC
HCT: 40.6 % (ref 39.0–52.0)
Hemoglobin: 14.3 g/dL (ref 13.0–17.0)
MCH: 33 pg (ref 26.0–34.0)
MCHC: 35.2 g/dL (ref 30.0–36.0)
WBC: 10 10*3/uL (ref 4.0–10.5)

## 2013-10-04 MED ORDER — DIPHENHYDRAMINE HCL 50 MG/ML IJ SOLN
25.0000 mg | Freq: Once | INTRAMUSCULAR | Status: AC
  Administered 2013-10-04: 25 mg via INTRAVENOUS
  Filled 2013-10-04: qty 1

## 2013-10-04 MED ORDER — METOCLOPRAMIDE HCL 5 MG/ML IJ SOLN
10.0000 mg | Freq: Once | INTRAMUSCULAR | Status: AC
  Administered 2013-10-04: 10 mg via INTRAVENOUS
  Filled 2013-10-04: qty 2

## 2013-10-04 NOTE — ED Notes (Signed)
Numbness started Friday in left thumb. ptstates now numbness is traveling up his left arm and he is complaining of headache as of 10am today.denies nausea and vomiting.no history of migraines.

## 2013-10-04 NOTE — ED Notes (Signed)
Pt returned from mri

## 2013-10-04 NOTE — ED Notes (Signed)
Patient changed into gown, waist up. 

## 2013-10-04 NOTE — ED Provider Notes (Signed)
CSN: [id number]     Arrival date & time 10/04/13  1341 History   First MD Initiated Contact with Patient 10/04/13 1533     Chief Complaint  Patient presents with  . Headache   (Consider location/radiation/quality/duration/timing/severity/associated sxs/prior Treatment) HPI Pt presents with c/o headache as well as numbness of fingers on left hand.  No weakness of arm/hand or fingers.  No changes in speech or vision.  He states numbness of fingers began 4 days ago.  Then last night he began to have a frontal throbbing headache- gradually worsening in nature.  Also has been having pain in left upper extremity which started last night.  No fever, no swelling of extremities.  No nausea/vomiting.  No head trauma.  Denies neck or shoulder pain or injury.  Pain in left upper extremity is worse with palpation.  There are no other associated systemic symptoms, there are no other alleviating or modifying factors. No chest pain or shortness of breath  Past Medical History  Diagnosis Date  . Stroke   . Seizures   . Bipolar 1 disorder   . Depression    Past Surgical History  Procedure Laterality Date  . Ankle surgery    . Splenectomy     History reviewed. No pertinent family history. History  Substance Use Topics  . Smoking status: Current Every Day Smoker -- 1.00 packs/day for 30 years    Types: Cigarettes  . Smokeless tobacco: Not on file  . Alcohol Use: No     Comment: lasr drink 06/19/13 Presently in [facility]    Review of Systems ROS reviewed and all otherwise negative except for mentioned in HPI  Allergies  Asa  Home Medications   Current Outpatient Rx  Name  Route  Sig  Dispense  Refill  . albuterol (PROVENTIL HFA;VENTOLIN HFA) 108 (90 BASE) MCG/ACT inhaler   Inhalation   Inhale 2 puffs into the lungs every 4 (four) hours as needed for wheezing.   1 Inhaler   0   . busPIRone (BUSPAR) 10 MG tablet   Oral   Take 1 tablet (10 mg total) by mouth 3 (three) times daily. For  anxiety symptoms   90 tablet   0   . carbamazepine (TEGRETOL) 200 MG tablet   Oral   Take 200 mg by mouth 3 (three) times daily.         Marland Kitchen gabapentin (NEURONTIN) 300 MG capsule   Oral   Take 2 capsules (600 mg total) by mouth 3 (three) times daily. For anxiety symptoms   180 capsule   0   . hydrOXYzine (ATARAX/VISTARIL) 25 MG tablet   Oral   Take 1 tablet (25 mg total) by mouth 2 (two) times daily as needed for anxiety.   60 tablet   0   . PARoxetine (PAXIL) 20 MG tablet   Oral   Take 1 tablet (20 mg total) by mouth daily. For depression   30 tablet   0   . QUEtiapine (SEROQUEL) 200 MG tablet   Oral   Take 1 tablet (200 mg total) by mouth at bedtime. For mood control   30 tablet   0   . QUEtiapine (SEROQUEL) 50 MG tablet   Oral   Take 1 tablet (50 mg total) by mouth 3 (three) times daily. For anxiety   90 tablet   0   . traZODone (DESYREL) 50 MG tablet   Oral   Take 1 tablet (50 mg total) by mouth at bedtime as  needed for sleep.   30 tablet   0   . acetaminophen (TYLENOL) 325 MG tablet   Oral   Take 2 tablets (650 mg total) by mouth every 6 (six) hours as needed for pain.   30 tablet   0    BP 126/80  Pulse 88  Temp(Src) 98.4 F (36.9 C) (Oral)  Resp 18  Ht 5\' 10"  (1.778 m)  Wt 205 lb (92.987 kg)  BMI 29.41 kg/m2  SpO2 97% Vitals reviewed Physical Exam Physical Examination: General appearance - alert, well appearing, and in no distress Mental status - alert, oriented to person, place, and time Eyes - pupils equal and reactive, extraocular eye movements intact Mouth - mucous membranes moist, pharynx normal without lesions Neck - supple, no significant adenopathy, FROM without pain, no carotid bruits Chest - clear to auscultation, no wheezes, rales or rhonchi, symmetric air entry Heart - normal rate, regular rhythm, normal S1, S2, no murmurs, rubs, clicks or gallops Abdomen - soft, nontender, nondistended, no masses or organomegaly Neurological -  alert, oriented x 3, cranial nerves 2-12 tested and intact, sensation intact, strength 5/5 in extremities x 4, sensation intact Musculoskeletal - no joint tenderness, deformity or swelling Extremities - peripheral pulses normal and symmetric, no pedal edema, no clubbing or cyanosis, no swelling of upper extremities, diffuse tenderness to touch over left upper extremity Skin - normal coloration and turgor, no rashes  ED Course  Procedures (including critical care time) Labs Review Labs Reviewed  BASIC METABOLIC PANEL - Abnormal; Notable for the following:    Glucose, Bld 106 (*)    GFR calc non Af Amer 72 (*)    GFR calc Af Amer 83 (*)    All other components within normal limits  CBC   Imaging Review Ct Head Wo Contrast  10/04/2013   CLINICAL DATA:  Headache with left arm tingling and numbness  EXAM: CT HEAD WITHOUT CONTRAST  TECHNIQUE: Contiguous axial images were obtained from the base of the skull through the vertex without intravenous contrast. Study was obtained within 24 hr of patient's arrival at the emergency department.  COMPARISON:  August 07, 2013  FINDINGS: The ventricles are normal in size and configuration. There is no mass, hemorrhage, extra-axial fluid collection or midline shift.  There is a is prior lacunar type infarct in the genu of the left internal capsule. The elsewhere gray-white compartments are normal. There is no demonstrable acute infarct. Bony calvarium appears intact. The mastoid air cells are clear. There is a stable small radiopaque foreign body in the soft tissues overlying the right frontal bone.  IMPRESSION: Prior small lacunar infarct in the genu of the left internal capsule. No intracranial mass, hemorrhage, or acute infarct. Small radiopaque foreign body in the soft tissues overlying the right frontal bone, stable.   Electronically Signed   By: Bretta Bang M.D.   On: 10/04/2013 14:35   Mr Brain Wo Contrast  10/04/2013   CLINICAL DATA:  Headaches with  left arm numbness and tingling. History of seizures.  EXAM: MRI HEAD WITHOUT CONTRAST  TECHNIQUE: Multiplanar, multisequence MR imaging was performed. No intravenous contrast was administered.  COMPARISON:  The 10/04/2013 CT. 03/27/2011 MR.  FINDINGS: No acute infarct.  No intracranial hemorrhage.  Remote small infarct left mid coronal radiata and right cerebellum.  No hydrocephalus.  No intracranial mass lesion noted on this unenhanced exam.  Major intracranial vascular structures are patent.  Slight minimal exophthalmos.  Minimal mucosal thickening ethmoid sinus air cells.  IMPRESSION: No acute infarct. Please see above.   Electronically Signed   By: Bridgett Larsson M.D.   On: 10/04/2013 17:28    EKG Interpretation   None       MDM   1. Headache   2. Numbness of fingers    Pt presenting with numbness in fingers, and headache with pain in left upper arm.  Head CT shows remote infarct that is stable in appearance.  MRI obtained and reassuring as well.  Pt has no weakness of extremities and cranial nerves are intact.  Pt treated with migraine cocktail with some relief.  No sign of SAH.  Will give information for neurology follow up.  Discharged with strict return precautions.  Pt agreeable with plan.    Ethelda Chick, MD 10/04/13 1754

## 2013-10-04 NOTE — ED Notes (Signed)
Patient transported to MRI 
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
## 2016-03-20 DIAGNOSIS — F1994 Other psychoactive substance use, unspecified with psychoactive substance-induced mood disorder: Secondary | ICD-10-CM | POA: Insufficient documentation

## 2016-03-21 DIAGNOSIS — F339 Major depressive disorder, recurrent, unspecified: Secondary | ICD-10-CM | POA: Insufficient documentation

## 2017-09-18 ENCOUNTER — Emergency Department (HOSPITAL_COMMUNITY): Payer: Self-pay

## 2017-09-18 ENCOUNTER — Emergency Department (HOSPITAL_COMMUNITY)
Admission: EM | Admit: 2017-09-18 | Discharge: 2017-09-19 | Disposition: A | Discharge status: Other Acute Inpatient Hospital | Payer: Self-pay | Attending: Emergency Medicine | Admitting: Emergency Medicine

## 2017-09-18 ENCOUNTER — Encounter (HOSPITAL_COMMUNITY): Payer: Self-pay | Admitting: *Deleted

## 2017-09-18 DIAGNOSIS — Z79899 Other long term (current) drug therapy: Secondary | ICD-10-CM | POA: Insufficient documentation

## 2017-09-18 DIAGNOSIS — F32A Depression, unspecified: Secondary | ICD-10-CM

## 2017-09-18 DIAGNOSIS — F319 Bipolar disorder, unspecified: Secondary | ICD-10-CM | POA: Insufficient documentation

## 2017-09-18 DIAGNOSIS — F1010 Alcohol abuse, uncomplicated: Secondary | ICD-10-CM | POA: Insufficient documentation

## 2017-09-18 DIAGNOSIS — R45851 Suicidal ideations: Secondary | ICD-10-CM | POA: Insufficient documentation

## 2017-09-18 DIAGNOSIS — F17210 Nicotine dependence, cigarettes, uncomplicated: Secondary | ICD-10-CM | POA: Insufficient documentation

## 2017-09-18 DIAGNOSIS — F329 Major depressive disorder, single episode, unspecified: Secondary | ICD-10-CM | POA: Insufficient documentation

## 2017-09-18 LAB — RAPID URINE DRUG SCREEN, HOSP PERFORMED
Amphetamines: NOT DETECTED
Barbiturates: NOT DETECTED
Benzodiazepines: NOT DETECTED
COCAINE: NOT DETECTED
OPIATES: NOT DETECTED
Tetrahydrocannabinol: NOT DETECTED

## 2017-09-18 LAB — CBC
HCT: 48.6 % (ref 39.0–52.0)
HEMOGLOBIN: 16.9 g/dL (ref 13.0–17.0)
MCH: 33.7 pg (ref 26.0–34.0)
MCHC: 34.8 g/dL (ref 30.0–36.0)
MCV: 96.8 fL (ref 78.0–100.0)
PLATELETS: 199 10*3/uL (ref 150–400)
RBC: 5.02 MIL/uL (ref 4.22–5.81)
RDW: 14 % (ref 11.5–15.5)
WBC: 9.9 10*3/uL (ref 4.0–10.5)

## 2017-09-18 LAB — COMPREHENSIVE METABOLIC PANEL
ALT: 17 U/L (ref 17–63)
ANION GAP: 10 (ref 5–15)
AST: 30 U/L (ref 15–41)
Albumin: 4.4 g/dL (ref 3.5–5.0)
Alkaline Phosphatase: 97 U/L (ref 38–126)
BUN: 9 mg/dL (ref 6–20)
CO2: 24 mmol/L (ref 22–32)
Calcium: 9.5 mg/dL (ref 8.9–10.3)
Chloride: 103 mmol/L (ref 101–111)
Creatinine, Ser: 1.02 mg/dL (ref 0.61–1.24)
GFR calc non Af Amer: >60 mL/min (ref >60)
Glucose, Bld: 99 mg/dL (ref 65–99)
Potassium: 3.5 mmol/L (ref 3.5–5.1)
SODIUM: 137 mmol/L (ref 135–145)
Total Bilirubin: 0.8 mg/dL (ref 0.3–1.2)
Total Protein: 7.6 g/dL (ref 6.5–8.1)

## 2017-09-18 LAB — SALICYLATE LEVEL: Salicylate Lvl: <7 mg/dL (ref 2.8–30.0)

## 2017-09-18 LAB — ACETAMINOPHEN LEVEL: Acetaminophen (Tylenol), Serum: <10 ug/mL — ABNORMAL LOW (ref 10–30)

## 2017-09-18 LAB — ETHANOL

## 2017-09-18 MED ORDER — LORAZEPAM 1 MG PO TABS
0.0000 mg | ORAL_TABLET | Freq: Four times a day (QID) | ORAL | Status: DC
  Administered 2017-09-18: 2 mg via ORAL
  Administered 2017-09-19: 1 mg via ORAL
  Filled 2017-09-18: qty 2
  Filled 2017-09-18: qty 1

## 2017-09-18 MED ORDER — ONDANSETRON HCL 4 MG PO TABS: 4.0000 mg | ORAL_TABLET | Freq: Three times a day (TID) | ORAL | Status: DC | PRN

## 2017-09-18 MED ORDER — VITAMIN B-1 100 MG PO TABS
100.0000 mg | ORAL_TABLET | Freq: Every day | ORAL | Status: DC
  Administered 2017-09-18 – 2017-09-19 (×2): 100 mg via ORAL
  Filled 2017-09-18: qty 1

## 2017-09-18 MED ORDER — THIAMINE HCL 100 MG/ML IJ SOLN: 100.0000 mg | Freq: Every day | INTRAMUSCULAR | Status: DC

## 2017-09-18 MED ORDER — ALUM & MAG HYDROXIDE-SIMETH 200-200-20 MG/5ML PO SUSP: 30.0000 mL | Freq: Four times a day (QID) | ORAL | Status: DC | PRN

## 2017-09-18 MED ORDER — GI COCKTAIL ~~LOC~~
30.0000 mL | Freq: Once | ORAL | Status: AC
  Administered 2017-09-18: 30 mL via ORAL
  Filled 2017-09-18: qty 30

## 2017-09-18 MED ORDER — LORAZEPAM 2 MG/ML IJ SOLN: 0.0000 mg | Freq: Four times a day (QID) | INTRAMUSCULAR | Status: DC

## 2017-09-18 MED ORDER — NICOTINE 21 MG/24HR TD PT24
21.0000 mg | MEDICATED_PATCH | Freq: Every day | TRANSDERMAL | Status: DC
  Administered 2017-09-18 – 2017-09-19 (×2): 21 mg via TRANSDERMAL
  Filled 2017-09-18 (×2): qty 1
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
## 2017-09-18 NOTE — BH Assessment (Signed)
[facility] Assessment Progress Note  Case discussed with Nira Conn, NP who recommends inpt treatment. EDP Kirichenko, Eligha Bridegroom and [place], RN notified of recommendation and in agreement with disposition.   Princess Bruins, MSW, LCSW Therapeutic Triage Specialist  [phone]
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
## 2017-09-18 NOTE — BH Assessment (Addendum)
Tele Assessment Note   Patient Name: Nathaniel Warren MRN: [id number] Referring Physician: Jaynie Crumble, PA-C Location of Patient: [facility] Location of Provider: Behavioral Health TTS Department  Nathaniel Warren is an 51 y.o. male who presents to the ED voluntarily following a suicide attempt. Pt reports he hung a rope around a tree and attempted to hang himself, however he did not proceed with the attempt because "my daughter called me right when I was going to hang myself and told me about the storm." Pt reports he tied the rope around his neck and was "about to do it." Pt reports he has been struggling with alcohol addiction for many years. Pt reports he lost his job and has been out of his psych medication for several months which led to his relapsing. Pt reports he has been consuming heavy amounts of alcohol daily for the past week.    Pt reports he has constant racing thoughts and experiences panic attacks several times per week. Pt states his panic attacks are triggered by his racing thoughts. Pt reports he feels his mind never rest. Pt describes his panic attacks as "I feel like my heart is about to explode, like it feels like a heart attack, I can't breathe, I'm shaking, I can't stop crying." Pt reports he feels as if he is in a deep, dark hole that he cannot get out of.    Pt denies that he has a current provider but states several years ago he received care with Dr. Dub Mikes, MD that he found to be extremely helpful. Pt asked this writer if he is recommended for inpt, will he be able to come to [facility].   Case discussed with Nira Conn, NP who recommends inpt treatment. EDP Kirichenko, Eligha Bridegroom and [place], RN notified of recommendation and in agreement with disposition.   Diagnosis: MDD, recurrent, moderate, w/o psychosis; GAD, Alcohol Use D/O  Past Medical History:  Past Medical History:  Diagnosis Date  . Bipolar 1 disorder (HCC)   . Depression   . Seizures (HCC)   . Stroke [facility])      Past Surgical History:  Procedure Laterality Date  . ANKLE SURGERY    . SPLENECTOMY      Family History: No family history on file.  Social History:  reports that he has been smoking Cigarettes.  He has a 30.00 pack-year smoking history. He has never used smokeless tobacco. He reports that he drinks alcohol. He reports that he does not use drugs.  Additional Social History:  Alcohol / Drug Use Pain Medications: See MAR Prescriptions: See MAR Over the Counter: See MAR History of alcohol / drug use?: Yes Longest period of sobriety (when/how long): 2.5 years Negative Consequences of Use: Financial, Personal relationships, Work / School Substance #1 Name of Substance 1: Alcohol 1 - Age of First Use: 14 1 - Amount (size/oz): pt reports "3 airplane bottles" 1 - Frequency: daily 1 - Duration: ongoing 1 - Last Use / Amount: 09/18/17  CIWA: CIWA-Ar BP: 120/85 Pulse Rate: 95 Nausea and Vomiting: no nausea and no vomiting Tactile Disturbances: none Tremor: severe, even with arms not extended Auditory Disturbances: not present Paroxysmal Sweats: no sweat visible Visual Disturbances: not present Anxiety: six Headache, Fullness in Head: none present Agitation: somewhat more than normal activity Orientation and Clouding of Sensorium: oriented and can do serial additions CIWA-Ar Total: 14 COWS:    PATIENT STRENGTHS: (choose at least two) Communication skills General fund of knowledge Motivation for treatment/growth  Allergies:  Allergies  Allergen Reactions  . Asa [Aspirin] Other (See Comments)    High doses makes nose bleed    Home Medications:  (Not in a hospital admission)  OB/GYN Status:  No LMP for male patient.  General Assessment Data Location of Assessment: [facility] ED TTS Assessment: In system Is this a Tele or Face-to-Face Assessment?: Tele Assessment Is this an Initial Assessment or a Re-assessment for this encounter?: Initial Assessment Marital status:  Divorced Is patient pregnant?: No Pregnancy Status: No Living Arrangements: Spouse/significant other (girlfriend ) Can pt return to current living arrangement?: Yes Admission Status: Voluntary Is patient capable of signing voluntary admission?: Yes Referral Source: Self/Family/Friend Insurance type: none on file      Crisis Care Plan Living Arrangements: Spouse/significant other (girlfriend ) Name of Psychiatrist: none Name of Therapist: none  Education Status Is patient currently in school?: No Highest grade of school patient has completed: Trade school   Risk to self with the past 6 months Suicidal Ideation: Yes-Currently Present Has patient been a risk to self within the past 6 months prior to admission? : Yes Suicidal Intent: Yes-Currently Present Has patient had any suicidal intent within the past 6 months prior to admission? : Yes Is patient at risk for suicide?: Yes Suicidal Plan?: Yes-Currently Present Has patient had any suicidal plan within the past 6 months prior to admission? : Yes Specify Current Suicidal Plan: pt reports a plan to hang himself  Access to Means: Yes Specify Access to Suicidal Means: pt states he has access to ropes  What has been your use of drugs/alcohol within the last 12 months?: reports to relapsing on alcohol 1 week ago and states he has been binge drinking ever since  Previous Attempts/Gestures: Yes How many times?: 1 Triggers for Past Attempts: Unpredictable Intentional Self Injurious Behavior: None Family Suicide History: No Recent stressful life event(s): Job Loss, Financial Problems Persecutory voices/beliefs?: No Depression: Yes Depression Symptoms: Despondent, Insomnia, Tearfulness, Isolating, Fatigue, Guilt, Loss of interest in usual pleasures, Feeling worthless/self pity, Feeling angry/irritable Substance abuse history and/or treatment for substance abuse?: Yes Suicide prevention information given to non-admitted patients: Not  applicable  Risk to Others within the past 6 months Homicidal Ideation: No Does patient have any lifetime risk of violence toward others beyond the six months prior to admission? : No Thoughts of Harm to Others: No Current Homicidal Intent: No Current Homicidal Plan: No Access to Homicidal Means: No History of harm to others?: No Assessment of Violence: None Noted Does patient have access to weapons?: No Criminal Charges Pending?: No Does patient have a court date: No Is patient on probation?: No  Psychosis Hallucinations: None noted Delusions: None noted  Mental Status Report Appearance/Hygiene: In scrubs, Unremarkable Eye Contact: Good Motor Activity: Freedom of movement Speech: Logical/coherent Level of Consciousness: Alert Mood: Depressed, Anxious, Guilty, Helpless, Sad, Sullen, Worthless, low self-esteem Affect: Anxious, Depressed, Sad Anxiety Level: Panic Attacks Panic attack frequency: 3x-4x/week Most recent panic attack: PTA to ED Thought Processes: Relevant, Coherent Judgement: Impaired Orientation: Person, Place, Time, Situation, Appropriate for developmental age Obsessive Compulsive Thoughts/Behaviors: Moderate  Cognitive Functioning Concentration: Normal Memory: Recent Intact, Remote Intact IQ: Average Insight: Poor Impulse Control: Poor Appetite: Good Sleep: Decreased Total Hours of Sleep: 4 Vegetative Symptoms: None  ADLScreening [facility] Assessment Services) Patient's cognitive ability adequate to safely complete daily activities?: Yes Patient able to express need for assistance with ADLs?: Yes Independently performs ADLs?: Yes (appropriate for developmental age)  Prior Inpatient Therapy Prior Inpatient Therapy:  Yes Prior Therapy Dates: 2017, 2016, 2014 Prior Therapy Facilty/Provider(s): [facility], [facility] Reason for Treatment: ALCOHOL DEPENDENCE, MDD   Prior Outpatient Therapy Prior Outpatient Therapy: Yes Prior Therapy Dates: 2014 Prior Therapy  Facilty/Provider(s): Dr. Dub Mikes, MD Reason for Treatment: Alcohol Dependence  Does patient have an ACCT team?: No Does patient have Intensive In-House Services?  : No Does patient have [facility] services? : No Does patient have P4CC services?: No  ADL Screening (condition at time of admission) Patient's cognitive ability adequate to safely complete daily activities?: Yes Is the patient deaf or have difficulty hearing?: No Does the patient have difficulty seeing, even when wearing glasses/contacts?: No Does the patient have difficulty concentrating, remembering, or making decisions?: No Patient able to express need for assistance with ADLs?: Yes Does the patient have difficulty dressing or bathing?: No Independently performs ADLs?: Yes (appropriate for developmental age) Does the patient have difficulty walking or climbing stairs?: No Weakness of Legs: None Weakness of Arms/Hands: None  Home Assistive Devices/Equipment Home Assistive Devices/Equipment: None    Abuse/Neglect Assessment (Assessment to be complete while patient is alone) Physical Abuse: Denies Verbal Abuse: Denies Sexual Abuse: Denies Exploitation of patient/patient's resources: Denies Self-Neglect: Denies     Merchant navy officer (For Healthcare) Does Patient Have a Medical Advance Directive?: No Would patient like information on creating a medical advance directive?: No - Patient declined    Additional Information 1:1 In Past 12 Months?: No CIRT Risk: No Elopement Risk: No Does patient have medical clearance?: Yes     Disposition:  Disposition Initial Assessment Completed for this Encounter: Yes Disposition of Patient: Inpatient treatment program Type of inpatient treatment program: Adult (per Nira Conn, NP)  This service was provided via telemedicine using a 2-way, interactive audio and Immunologist.  Names of all persons participating in this telemedicine service and their role in this  encounter. Name: Princess Bruins Role: TTS Counselor  Name: [name] Role: Patient           Karolee Ohs 09/18/2017 7:44 PM

## 2017-09-19 NOTE — ED Notes (Addendum)
Woke pt so may advise of tx plan - No tremors noted when pt was sleeping. Pt voiced agreement w/tx plan - accepted to [facility] - Offered for pt to call his girlfriend to notify.

## 2017-09-19 NOTE — ED Notes (Signed)
Re-TTS being performed.  

## 2017-09-19 NOTE — ED Notes (Signed)
Pt left message for girlfriend.

## 2017-09-19 NOTE — ED Notes (Signed)
Breakfast tray given. °

## 2017-09-19 NOTE — BHH Counselor (Signed)
Re-assessment:   Patient continues to report suicidal ideations, racing thoughts, and symptoms consist with depression and anxiety. Denies HI and AVH. Report does not feel safe to return home. Report has not been on medication 3 or 4 months. Report has not followed up with community agencies to receive medications due to not knowing community resources. Report lack of sleep and negative internal stimuli. Report previous diagnosis Depression, GAD and unspecified Bi-polar.

## 2017-09-19 NOTE — BHH Counselor (Signed)
Disposition:  Per Leighton Ruff, NP, patient meet criteria for inpatient. TTS to seek placement.

## 2017-09-19 NOTE — ED Notes (Signed)
Ativan given as requested. Pt denied withdrawal symptoms earlier - pt was eating breakfast - no tremors noted - was able to feed self w/o tremors being observed. After TTS, pt c/o feeling anxious.

## 2017-09-19 NOTE — BHH Counselor (Signed)
CSW faxed referrals to [facility], [place], [place] [place] and [place] for inpatient hospitalization.   Daisy Floro Gwendlyn Hanback MSW, LCSW  09/19/2017 10:06 AM

## 2017-10-20 ENCOUNTER — Inpatient Hospital Stay (HOSPITAL_COMMUNITY)
Admission: EM | Admit: 2017-10-20 | Discharge: 2017-10-27 | DRG: 918 | Disposition: A | Discharge status: Other Acute Inpatient Hospital | Payer: Self-pay | Attending: Internal Medicine | Admitting: Internal Medicine

## 2017-10-20 ENCOUNTER — Encounter (HOSPITAL_COMMUNITY): Payer: Self-pay | Admitting: Internal Medicine

## 2017-10-20 ENCOUNTER — Emergency Department (HOSPITAL_COMMUNITY): Payer: Self-pay

## 2017-10-20 DIAGNOSIS — F17210 Nicotine dependence, cigarettes, uncomplicated: Secondary | ICD-10-CM | POA: Diagnosis present

## 2017-10-20 DIAGNOSIS — T50902A Poisoning by unspecified drugs, medicaments and biological substances, intentional self-harm, initial encounter: Secondary | ICD-10-CM | POA: Diagnosis present

## 2017-10-20 DIAGNOSIS — F32A Depression, unspecified: Secondary | ICD-10-CM | POA: Diagnosis present

## 2017-10-20 DIAGNOSIS — G40909 Epilepsy, unspecified, not intractable, without status epilepticus: Secondary | ICD-10-CM | POA: Diagnosis present

## 2017-10-20 DIAGNOSIS — T50901A Poisoning by unspecified drugs, medicaments and biological substances, accidental (unintentional), initial encounter: Secondary | ICD-10-CM

## 2017-10-20 DIAGNOSIS — Z79899 Other long term (current) drug therapy: Secondary | ICD-10-CM

## 2017-10-20 DIAGNOSIS — Z8249 Family history of ischemic heart disease and other diseases of the circulatory system: Secondary | ICD-10-CM

## 2017-10-20 DIAGNOSIS — I6381 Other cerebral infarction due to occlusion or stenosis of small artery: Secondary | ICD-10-CM | POA: Diagnosis present

## 2017-10-20 DIAGNOSIS — Z8673 Personal history of transient ischemic attack (TIA), and cerebral infarction without residual deficits: Secondary | ICD-10-CM

## 2017-10-20 DIAGNOSIS — Z9081 Acquired absence of spleen: Secondary | ICD-10-CM

## 2017-10-20 DIAGNOSIS — Z590 Homelessness: Secondary | ICD-10-CM

## 2017-10-20 DIAGNOSIS — T43592A Poisoning by other antipsychotics and neuroleptics, intentional self-harm, initial encounter: Principal | ICD-10-CM | POA: Diagnosis present

## 2017-10-20 DIAGNOSIS — Z811 Family history of alcohol abuse and dependence: Secondary | ICD-10-CM

## 2017-10-20 DIAGNOSIS — F1020 Alcohol dependence, uncomplicated: Secondary | ICD-10-CM | POA: Diagnosis present

## 2017-10-20 DIAGNOSIS — G4700 Insomnia, unspecified: Secondary | ICD-10-CM | POA: Diagnosis present

## 2017-10-20 DIAGNOSIS — F329 Major depressive disorder, single episode, unspecified: Secondary | ICD-10-CM | POA: Diagnosis present

## 2017-10-20 DIAGNOSIS — Z9114 Patient's other noncompliance with medication regimen: Secondary | ICD-10-CM

## 2017-10-20 DIAGNOSIS — F10229 Alcohol dependence with intoxication, unspecified: Secondary | ICD-10-CM | POA: Diagnosis present

## 2017-10-20 DIAGNOSIS — R569 Unspecified convulsions: Secondary | ICD-10-CM

## 2017-10-20 DIAGNOSIS — G40901 Epilepsy, unspecified, not intractable, with status epilepticus: Secondary | ICD-10-CM

## 2017-10-20 DIAGNOSIS — T510X2A Toxic effect of ethanol, intentional self-harm, initial encounter: Secondary | ICD-10-CM | POA: Diagnosis present

## 2017-10-20 DIAGNOSIS — F411 Generalized anxiety disorder: Secondary | ICD-10-CM | POA: Diagnosis present

## 2017-10-20 DIAGNOSIS — F10220 Alcohol dependence with intoxication, uncomplicated: Secondary | ICD-10-CM

## 2017-10-20 DIAGNOSIS — F332 Major depressive disorder, recurrent severe without psychotic features: Secondary | ICD-10-CM | POA: Diagnosis present

## 2017-10-20 DIAGNOSIS — Z886 Allergy status to analgesic agent status: Secondary | ICD-10-CM

## 2017-10-20 DIAGNOSIS — Z23 Encounter for immunization: Secondary | ICD-10-CM

## 2017-10-20 HISTORY — DX: Poisoning by unspecified drugs, medicaments and biological substances, accidental (unintentional), initial encounter: T50.901A

## 2017-10-20 HISTORY — DX: Poisoning by unspecified drugs, medicaments and biological substances, intentional self-harm, initial encounter: T50.902A

## 2017-10-20 LAB — CBG MONITORING, ED: Glucose-Capillary: 97 mg/dL (ref 65–99)

## 2017-10-20 LAB — URINALYSIS, ROUTINE W REFLEX MICROSCOPIC
Bilirubin Urine: NEGATIVE
Glucose, UA: NEGATIVE mg/dL
Hgb urine dipstick: NEGATIVE
KETONES UR: 5 mg/dL — AB
LEUKOCYTES UA: NEGATIVE
NITRITE: NEGATIVE
PROTEIN: NEGATIVE mg/dL
Specific Gravity, Urine: 1.017 (ref 1.005–1.030)
pH: 5 (ref 5.0–8.0)

## 2017-10-20 LAB — COMPREHENSIVE METABOLIC PANEL
ALK PHOS: 89 U/L (ref 38–126)
ALT: 20 U/L (ref 17–63)
ANION GAP: 11 (ref 5–15)
AST: 33 U/L (ref 15–41)
Albumin: 4.2 g/dL (ref 3.5–5.0)
BUN: 10 mg/dL (ref 6–20)
CALCIUM: 8.5 mg/dL — AB (ref 8.9–10.3)
CO2: 23 mmol/L (ref 22–32)
CREATININE: 0.91 mg/dL (ref 0.61–1.24)
Chloride: 106 mmol/L (ref 101–111)
Glucose, Bld: 93 mg/dL (ref 65–99)
Potassium: 3.8 mmol/L (ref 3.5–5.1)
SODIUM: 140 mmol/L (ref 135–145)
TOTAL PROTEIN: 7.5 g/dL (ref 6.5–8.1)
Total Bilirubin: 0.6 mg/dL (ref 0.3–1.2)

## 2017-10-20 LAB — CBC WITH DIFFERENTIAL/PLATELET
Basophils Absolute: 0.1 10*3/uL (ref 0.0–0.1)
Basophils Relative: 1 %
EOS ABS: 0.1 10*3/uL (ref 0.0–0.7)
EOS PCT: 2 %
HCT: 47.1 % (ref 39.0–52.0)
Hemoglobin: 16.5 g/dL (ref 13.0–17.0)
LYMPHS ABS: 3.5 10*3/uL (ref 0.7–4.0)
LYMPHS PCT: 43 %
MCH: 33.7 pg (ref 26.0–34.0)
MCHC: 35 g/dL (ref 30.0–36.0)
MCV: 96.1 fL (ref 78.0–100.0)
MONO ABS: 0.4 10*3/uL (ref 0.1–1.0)
MONOS PCT: 5 %
Neutro Abs: 4 10*3/uL (ref 1.7–7.7)
Neutrophils Relative %: 49 %
PLATELETS: 212 10*3/uL (ref 150–400)
RBC: 4.9 MIL/uL (ref 4.22–5.81)
RDW: 15.4 % (ref 11.5–15.5)
WBC: 8.1 10*3/uL (ref 4.0–10.5)

## 2017-10-20 LAB — RAPID URINE DRUG SCREEN, HOSP PERFORMED
AMPHETAMINES: NOT DETECTED
BENZODIAZEPINES: POSITIVE — AB
Barbiturates: NOT DETECTED
COCAINE: NOT DETECTED
OPIATES: NOT DETECTED
Tetrahydrocannabinol: NOT DETECTED

## 2017-10-20 LAB — SALICYLATE LEVEL

## 2017-10-20 LAB — ACETAMINOPHEN LEVEL

## 2017-10-20 LAB — LITHIUM LEVEL: Lithium Lvl: <0.06 mmol/L — ABNORMAL LOW (ref 0.60–1.20)

## 2017-10-20 LAB — ETHANOL: Alcohol, Ethyl (B): 305 mg/dL (ref <10)

## 2017-10-20 MED ORDER — LORAZEPAM 2 MG/ML IJ SOLN: 1.0000 mg | Freq: Four times a day (QID) | INTRAMUSCULAR | Status: AC | PRN

## 2017-10-20 MED ORDER — ONDANSETRON HCL 4 MG PO TABS
4.0000 mg | ORAL_TABLET | Freq: Four times a day (QID) | ORAL | Status: DC | PRN
  Administered 2017-10-21: 4 mg via ORAL
  Filled 2017-10-20: qty 1

## 2017-10-20 MED ORDER — THIAMINE HCL 100 MG/ML IJ SOLN
100.0000 mg | Freq: Every day | INTRAMUSCULAR | Status: DC
  Administered 2017-10-20: 100 mg via INTRAVENOUS
  Filled 2017-10-20: qty 2

## 2017-10-20 MED ORDER — LORAZEPAM 2 MG/ML IJ SOLN
2.0000 mg | Freq: Once | INTRAMUSCULAR | Status: AC
  Administered 2017-10-20: 2 mg via INTRAVENOUS
  Filled 2017-10-20: qty 1

## 2017-10-20 MED ORDER — VITAMIN B-1 100 MG PO TABS
100.0000 mg | ORAL_TABLET | Freq: Every day | ORAL | Status: DC
  Administered 2017-10-21 – 2017-10-26 (×6): 100 mg via ORAL
  Filled 2017-10-20 (×7): qty 1

## 2017-10-20 MED ORDER — LORAZEPAM 2 MG/ML IJ SOLN
2.0000 mg | Freq: Once | INTRAMUSCULAR | Status: AC
Start: 2017-10-20 — End: 2017-10-20
  Administered 2017-10-20: 2 mg via INTRAVENOUS
  Filled 2017-10-20: qty 1

## 2017-10-20 MED ORDER — FOLIC ACID 1 MG PO TABS
1.0000 mg | ORAL_TABLET | Freq: Every day | ORAL | Status: DC
  Administered 2017-10-21 – 2017-10-27 (×8): 1 mg via ORAL
  Filled 2017-10-20 (×8): qty 1

## 2017-10-20 MED ORDER — VALPROATE SODIUM 500 MG/5ML IV SOLN
500.0000 mg | Freq: Two times a day (BID) | INTRAVENOUS | Status: DC
  Filled 2017-10-20 (×2): qty 5

## 2017-10-20 MED ORDER — ONDANSETRON HCL 4 MG/2ML IJ SOLN: 4.0000 mg | Freq: Four times a day (QID) | INTRAMUSCULAR | Status: DC | PRN

## 2017-10-20 MED ORDER — SODIUM CHLORIDE 0.9 % IV SOLN
INTRAVENOUS | Status: DC
  Administered 2017-10-20 – 2017-10-21 (×2): via INTRAVENOUS

## 2017-10-20 MED ORDER — LORAZEPAM 1 MG PO TABS
1.0000 mg | ORAL_TABLET | Freq: Four times a day (QID) | ORAL | Status: AC | PRN
  Administered 2017-10-21 – 2017-10-22 (×3): 1 mg via ORAL
  Filled 2017-10-20 (×3): qty 1

## 2017-10-20 MED ORDER — ADULT MULTIVITAMIN W/MINERALS CH
1.0000 | ORAL_TABLET | Freq: Every day | ORAL | Status: DC
Start: 2017-10-20 — End: 2017-10-27
  Administered 2017-10-21 – 2017-10-27 (×8): 1 via ORAL
  Filled 2017-10-20 (×8): qty 1

## 2017-10-20 MED ORDER — VALPROATE SODIUM 500 MG/5ML IV SOLN
1750.0000 mg | Freq: Once | INTRAVENOUS | Status: AC
  Administered 2017-10-20: 1750 mg via INTRAVENOUS
  Filled 2017-10-20: qty 17.5

## 2017-10-20 NOTE — ED Notes (Signed)
ED Provider at bedside. 

## 2017-10-20 NOTE — ED Notes (Signed)
Pt began seizing again, this time more violently than the last. Depakote has been started. Dr. Criss AlvineGoldston made aware.

## 2017-10-20 NOTE — ED Notes (Signed)
This RN going off the floor to be with patient during CT d/t risk for more seizures.

## 2017-10-20 NOTE — ED Triage Notes (Signed)
[place] poison control called, reports pt took 30 300mg  Seroquel as well as drinking alcohol in attempt to harm himself. [place] poison control recommends drawing 4 hour post acetaminophen level, CMP and EKG. Recommend IV fluids for tachycardia, benzos for seizure observe to make sure pt is able to urinate. Observe for 6 hours.

## 2017-10-20 NOTE — ED Notes (Signed)
Pt is seizing again. Dr. Criss AlvineGoldston notified.

## 2017-10-20 NOTE — ED Triage Notes (Addendum)
Pt arrived to [facility] via [facility] after having 3 seizures. Pt reports taking 2100mg  seroquel and drinking 2 20oz beers. Pt given 5mg  midazolam IV by EMS.    When Dr. Criss AlvineGoldston asked "why did you take all of these pills?" The pt responded "because I want to die."   Pt

## 2017-10-20 NOTE — ED Provider Notes (Signed)
[facility]-EMERGENCY DEPT Provider Note   CSN: [id number] Arrival date & time: 10/20/17  1753     History   Chief Complaint Chief Complaint  Patient presents with  . Drug Overdose    HPI Nathaniel Warren is a 51 y.o. male.  HPI  51 year old male with a history of bipolar disorder, depression, seizures, and stroke presents with an intentional drug overdose.  Patient states he took his week's worth of 300 mg Seroquel, drink 2 24 ounce beers, as well as multiple other medicines that he is not sure exactly which ones he took.  This was sometime around 1 or 2:00 this afternoon.  He states he has felt suicidal and wanted to kill himself.  He has a history of a prior stroke in 2011 and states he has had seizures on and off since, most recently a few months ago.  He has a history of alcohol abuse and while he drink today he states he has not had any alcohol in a few months until today.  He states he used to be on Keppra but this was taken off by his psychiatrist a few months ago, he is unsure why.  He denies being on any other seizure medicine such as Tegretol which is listed in his med list.  However this is not present in the medications brought in with him by EMS.  EMS was called by the girlfriend after the patient overdosed.  The patient was found sitting up in his car and no clear trauma.  EMS noticed 3 seizures, all generalized tonic-clonic that were less than 1 minute.  Gave 5 mg IV Midazolam.  He returns to his baseline each time.  Past Medical History:  Diagnosis Date  . Bipolar 1 disorder (HCC)   . Depression   . Seizures (HCC)   . Stroke [facility])     Patient Active Problem List   Diagnosis Date Noted  . Alcohol dependence (HCC) 06/19/2013    Class: Acute  . MDD (major depressive disorder), recurrent episode, severe (HCC) 04/10/2013    Class: Acute  . GAD (generalized anxiety disorder) 04/10/2013  . Depression 01/11/2013  . Anxiety 01/11/2013    Past  Surgical History:  Procedure Laterality Date  . ANKLE SURGERY    . SPLENECTOMY         Home Medications    Prior to Admission medications   Medication Sig Start Date End Date Taking? Authorizing Provider  acetaminophen (TYLENOL) 325 MG tablet Take 2 tablets (650 mg total) by mouth every 6 (six) hours as needed for pain. Patient not taking: Reported on 09/18/2017 09/05/13   Teressa Lower, NP  albuterol (PROVENTIL HFA;VENTOLIN HFA) 108 (90 BASE) MCG/ACT inhaler Inhale 2 puffs into the lungs every 4 (four) hours as needed for wheezing. 08/15/13   Cherrie Distance, PA-C  busPIRone (BUSPAR) 10 MG tablet Take 1 tablet (10 mg total) by mouth 3 (three) times daily. For anxiety symptoms 06/23/13   Armandina Stammer I, NP  carbamazepine (TEGRETOL) 200 MG tablet Take 200 mg by mouth 3 (three) times daily.    [provider]  gabapentin (NEURONTIN) 300 MG capsule Take 2 capsules (600 mg total) by mouth 3 (three) times daily. For anxiety symptoms 06/23/13   Armandina Stammer I, NP  hydrOXYzine (ATARAX/VISTARIL) 25 MG tablet Take 1 tablet (25 mg total) by mouth 2 (two) times daily as needed for anxiety. 06/23/13   Armandina Stammer I, NP  ibuprofen (ADVIL,MOTRIN) 200 MG tablet Take 600 mg  by mouth every 6 (six) hours as needed for mild pain.    [provider]  PARoxetine (PAXIL) 20 MG tablet Take 1 tablet (20 mg total) by mouth daily. For depression 06/23/13   Armandina StammerNwoko, Agnes I, NP  QUEtiapine (SEROQUEL) 200 MG tablet Take 1 tablet (200 mg total) by mouth at bedtime. For mood control 06/23/13   Armandina StammerNwoko, Agnes I, NP  QUEtiapine (SEROQUEL) 50 MG tablet Take 1 tablet (50 mg total) by mouth 3 (three) times daily. For anxiety Patient not taking: Reported on 09/18/2017 06/23/13   Armandina StammerNwoko, Agnes I, NP  traZODone (DESYREL) 50 MG tablet Take 1 tablet (50 mg total) by mouth at bedtime as needed for sleep. 06/23/13   Sanjuana KavaNwoko, Agnes I, NP    Family History No family history on file.  Social History Social History    Tobacco Use  . Smoking status: Current Every Day Smoker    Packs/day: 1.00    Years: 30.00    Pack years: 30.00    Types: Cigarettes  . Smokeless tobacco: Never Used  Substance Use Topics  . Alcohol use: Yes    Comment: lasr drink 06/19/13 [facility], last use this morning 09/07/17  . Drug use: No    Comment: teenager used THC, only one occasion     Allergies   Asa [aspirin]   Review of Systems Review of Systems  Unable to perform ROS: Acuity of condition     Physical Exam Updated Vital Signs There were no vitals taken for this visit.  Physical Exam  Constitutional: He is oriented to person, place, and time. He appears well-developed and well-nourished. No distress.  HENT:  Head: Normocephalic and atraumatic.  Right Ear: External ear normal.  Left Ear: External ear normal.  Nose: Nose normal.  Mouth/Throat: Oropharynx is clear and moist.  No intra-oral injury  Eyes: EOM are normal. Pupils are equal, round, and reactive to light. Right eye exhibits no discharge. Left eye exhibits no discharge.  Neck: Normal range of motion. Neck supple.  Cardiovascular: Normal rate, regular rhythm and normal heart sounds.  Pulmonary/Chest: Effort normal and breath sounds normal.  Abdominal: Soft. There is no tenderness.  Musculoskeletal: He exhibits no edema.  Neurological: He is alert and oriented to person, place, and time.  CN 3-12 grossly intact. Awake, alert, appropriate. 5/5 strength in RUE, LUE, LLE. 4/5 strength RLE. Normal finger to nose left side, difficulty with right.  Skin: Skin is warm and dry. He is not diaphoretic.  Nursing note and vitals reviewed.    ED Treatments / Results  Labs (all labs ordered are listed, but only abnormal results are displayed) Labs Reviewed  ETHANOL - Abnormal; Notable for the following components:      Result Value   Alcohol, Ethyl (B) 305 (*)    All other components within normal limits  ACETAMINOPHEN LEVEL - Abnormal;  Notable for the following components:   Acetaminophen (Tylenol), Serum <10 (*)    All other components within normal limits  URINALYSIS, ROUTINE W REFLEX MICROSCOPIC - Abnormal; Notable for the following components:   Ketones, ur 5 (*)    All other components within normal limits  RAPID URINE DRUG SCREEN, HOSP PERFORMED - Abnormal; Notable for the following components:   Benzodiazepines POSITIVE (*)    All other components within normal limits  LITHIUM LEVEL - Abnormal; Notable for the following components:   Lithium Lvl <0.06 (*)    All other components within normal limits  COMPREHENSIVE METABOLIC PANEL -  Abnormal; Notable for the following components:   Calcium 8.5 (*)    All other components within normal limits  SALICYLATE LEVEL  CBC WITH DIFFERENTIAL/PLATELET  HIV ANTIBODY (ROUTINE TESTING)  BASIC METABOLIC PANEL  CBC  CBC WITH DIFFERENTIAL/PLATELET  TSH  HEPATIC FUNCTION PANEL  CBG MONITORING, ED    EKG  EKG Interpretation  Date/Time:  Tuesday October 20 2017 18:10:20 EST Ventricular Rate:  100 PR Interval:    QRS Duration: 99 QT Interval:  355 QTC Calculation: 458 R Axis:   -77 Text Interpretation:  Sinus tachycardia Left anterior fascicular block Abnormal R-wave progression, late transition no significant change since 2014 Confirmed by Pricilla Loveless [phone]) on 10/20/2017 6:14:25 PM       Radiology Ct Head Wo Contrast  Result Date: 10/20/2017 CLINICAL DATA:  Seizure, possible overdose EXAM: CT HEAD WITHOUT CONTRAST TECHNIQUE: Contiguous axial images were obtained from the base of the skull through the vertex without intravenous contrast. COMPARISON:  12/14/2016, 10/04/2013 FINDINGS: Brain: No acute territorial infarction, hemorrhage, or intracranial mass is visualized. Old lacunar infarcts in the right cerebellum and left caudate. Normal ventricle size. Vascular: No hyperdense vessel or unexpected calcification. Skull: Normal. Negative for fracture or focal  lesion. Sinuses/Orbits: No acute finding. Other: None IMPRESSION: No CT evidence for acute intracranial abnormality. Electronically Signed   By: Jasmine Pang M.D.   On: 10/20/2017 19:06    Procedures .Critical Care Performed by: Pricilla Loveless, MD Authorized by: Pricilla Loveless, MD   Critical care provider statement:    Critical care time (minutes):  45   Critical care was necessary to treat or prevent imminent or life-threatening deterioration of the following conditions:  CNS failure or compromise and respiratory failure   Critical care was time spent personally by me on the following activities:  Discussions with consultants, evaluation of patient's response to treatment, examination of patient, obtaining history from patient or surrogate, ordering and performing treatments and interventions, ordering and review of laboratory studies, ordering and review of radiographic studies, pulse oximetry, re-evaluation of patient's condition and review of old charts   (including critical care time)  Medications Ordered in ED Medications  ondansetron (ZOFRAN) tablet 4 mg (not administered)    Or  ondansetron (ZOFRAN) injection 4 mg (not administered)  0.9 %  sodium chloride infusion ( Intravenous New Bag/Given 10/20/17 2232)  valproate (DEPACON) 500 mg in dextrose 5 % 50 mL IVPB (not administered)  LORazepam (ATIVAN) tablet 1 mg (not administered)    Or  LORazepam (ATIVAN) injection 1 mg (not administered)  thiamine (VITAMIN B-1) tablet 100 mg ( Oral See Alternative 10/20/17 2232)    Or  thiamine (B-1) injection 100 mg (100 mg Intravenous Given 10/20/17 2232)  folic acid (FOLVITE) tablet 1 mg (not administered)  multivitamin with minerals tablet 1 tablet (not administered)  LORazepam (ATIVAN) injection 2 mg (2 mg Intravenous Given 10/20/17 1813)  LORazepam (ATIVAN) injection 2 mg (2 mg Intravenous Given 10/20/17 1834)  valproate (DEPACON) 1,750 mg in dextrose 5 % 50 mL IVPB (0 mg Intravenous  Stopped 10/20/17 1953)     Initial Impression / Assessment and Plan / ED Course  I have reviewed the triage vital signs and the nursing notes.  Pertinent labs & imaging results that were available during my care of the patient were reviewed by me and considered in my medical decision making (see chart for details).     Patient presents with multiple intermittent seizures.  He comes back to baseline each time.  He  was given multiple doses of IV Ativan here.  I discussed with neurology, Dr. Amada JupiterKirkpatrick, who advises Depakote IV at 20 mg/kg because of prior issues and failure with Keppra.  It does not appear based on his current medicines that he is truly on Tegretol.  After Depakote his seizure activity has stopped.  He does have a history of seizures and likely his overdose has lowered his threshold.  No fevers or signs of an acute infection.  He will need to be admitted and neurology recommends [facility] treatment and care.  He has remained in the ED for several hours with no further seizures. Dr Toniann FailKakrakandy to admit.  Final Clinical Impressions(s) / ED Diagnoses   Final diagnoses:  Intentional drug overdose, initial encounter [facility](HCC)  Status epilepticus [facility](HCC)    ED Discharge Orders    None       Pricilla LovelessGoldston, Quincey Nored, MD 10/20/17 2333

## 2017-10-20 NOTE — H&P (Signed)
History and Physical    Nathaniel Warren ZOX:[id number] DOB: September 20, 1966 DOA: 10/20/2017  PCP: System, Pcp Not In  Patient coming from: Patient's girlfriend brought him.  Chief Complaint: Depression and suicidal and seizures.  HPI: Nathaniel Warren is a 51 y.o. male with history of bipolar disorder, seizure disorder and alcoholism was brought to the ER by patient's girlfriend after patient stated that he overdosed with Seroquel and hydroxyzine.  Patient is at this time homeless and patient's girlfriend went to pick him up and after reaching home at around noon time patient stated to the girlfriend that he had overdosed with Seroquel and hydroxyzine.  Also stated he drank alcohol.  Patient's daughter immediately called EMS and patient was brought to the ER.  As per the ER physician on route patient had a seizure.  ED Course: In the ER patient had another 2 more seizures tonic-clonic the following which patient was given Ativan 2 mg IV.  Neurologist was consulted and patient was placed on Depacon.  Patient has previous history of seizures and was prescribed previously Tegretol and Keppra.  Not sure if patient had been taking them.  As per the girlfriend patient had come to the ER 3 weeks ago with suicidal thoughts and at that time also patient was instructed to take Keppra.  CT of the head was unremarkable urine drug screen was positive for benzodiazepine.  EKG was showing sinus tachycardia with QTC of 458 ms and QRS of 99 ms.  Patient at the time of my exam is encephalopathic after receiving Ativan and also postictal.  Is following commands and moving all extremities.  Pupils are reacting to light.  Review of Systems: As per HPI, rest all negative.   Past Medical History:  Diagnosis Date  . Bipolar 1 disorder (HCC)   . Depression   . Seizures (HCC)   . Stroke [facility])     Past Surgical History:  Procedure Laterality Date  . ANKLE SURGERY    . SPLENECTOMY       reports that he has been  smoking cigarettes.  He has a 30.00 pack-year smoking history. he has never used smokeless tobacco. He reports that he drinks alcohol. He reports that he does not use drugs.  Allergies  Allergen Reactions  . Asa [Aspirin] Other (See Comments)    High doses makes nose bleed    Family History  Problem Relation Age of Onset  . Hypertension Other     Prior to Admission medications   Medication Sig Start Date End Date Taking? Authorizing Provider  albuterol (PROVENTIL HFA;VENTOLIN HFA) 108 (90 BASE) MCG/ACT inhaler Inhale 2 puffs into the lungs every 4 (four) hours as needed for wheezing. 08/15/13  Yes Cherrie Distance, PA-C  busPIRone (BUSPAR) 10 MG tablet Take 1 tablet (10 mg total) by mouth 3 (three) times daily. For anxiety symptoms 06/23/13  Yes Armandina Stammer I, NP  gabapentin (NEURONTIN) 300 MG capsule Take 2 capsules (600 mg total) by mouth 3 (three) times daily. For anxiety symptoms Patient taking differently: Take 300 mg 3 (three) times daily by mouth. For anxiety symptoms 06/23/13  Yes Armandina Stammer I, NP  hydrOXYzine (ATARAX/VISTARIL) 50 MG tablet Take 50 mg 4 (four) times daily as needed by mouth for anxiety.   Yes [provider]  lithium carbonate 300 MG capsule Take 600 mg at bedtime by mouth.   Yes [provider]  QUEtiapine (SEROQUEL) 25 MG tablet Take 25 mg 2 (two) times daily by mouth. FOR  AGITATION, ANXIETY, AND INSOMNIA   Yes [provider]  QUEtiapine (SEROQUEL) 300 MG tablet Take 300 mg at bedtime by mouth.   Yes [provider]  traZODone (DESYREL) 100 MG tablet Take 100 mg at bedtime as needed by mouth (ANXIETY).    Yes [provider]  acetaminophen (TYLENOL) 325 MG tablet Take 2 tablets (650 mg total) by mouth every 6 (six) hours as needed for pain. Patient not taking: Reported on 09/18/2017 09/05/13   Teressa Lower, NP  carbamazepine (TEGRETOL) 200 MG tablet Take 200 mg by mouth 3 (three) times daily.    [provider]  hydrOXYzine (ATARAX/VISTARIL) 25 MG tablet Take 1 tablet (25 mg total) by mouth 2 (two) times daily as needed for anxiety. 06/23/13   Armandina Stammer I, NP  ibuprofen (ADVIL,MOTRIN) 200 MG tablet Take 600 mg by mouth every 6 (six) hours as needed for mild pain.    [provider]  PARoxetine (PAXIL) 20 MG tablet Take 1 tablet (20 mg total) by mouth daily. For depression 06/23/13   Armandina Stammer I, NP  QUEtiapine (SEROQUEL) 200 MG tablet Take 1 tablet (200 mg total) by mouth at bedtime. For mood control 06/23/13   Armandina Stammer I, NP  QUEtiapine (SEROQUEL) 50 MG tablet Take 1 tablet (50 mg total) by mouth 3 (three) times daily. For anxiety Patient not taking: Reported on 09/18/2017 06/23/13   Armandina Stammer I, NP  traZODone (DESYREL) 50 MG tablet Take 1 tablet (50 mg total) by mouth at bedtime as needed for sleep. 06/23/13   Armandina Stammer I, NP    Physical Exam: Vitals:   10/20/17 2045 10/20/17 2100 10/20/17 2115 10/20/17 2130  BP: 122/71 127/65 120/74 114/70  Pulse: 89 96 95 88  Resp: 20 20 18  (!) 22  Temp:      TempSrc:      SpO2: 92% (!) 88% 97% 96%  Weight:      Height:          Constitutional: Moderately built and nourished. Vitals:   10/20/17 2045 10/20/17 2100 10/20/17 2115 10/20/17 2130  BP: 122/71 127/65 120/74 114/70  Pulse: 89 96 95 88  Resp: 20 20 18  (!) 22  Temp:      TempSrc:      SpO2: 92% (!) 88% 97% 96%  Weight:      Height:       Eyes: Anicteric no pallor. ENMT: No discharge from the ears eyes nose or mouth. Neck: No neck rigidity no mass felt. Respiratory: No rhonchi or crepitations. Cardiovascular: S1-S2 heard no murmurs appreciated. Abdomen: Soft nontender bowel sounds present. Musculoskeletal: No edema.  No joint effusion. Skin: No rash.  Skin appears warm. Neurologic: Encephalopathic but follows commands and moves all extremities pupils are reacting to light. Psychiatric: Encephalopathic.   Labs on Admission: I have personally  reviewed following labs and imaging studies  CBC: Recent Labs  Lab 10/20/17 1830  WBC 8.1  NEUTROABS 4.0  HGB 16.5  HCT 47.1  MCV 96.1  PLT 212   Basic Metabolic Panel: Recent Labs  Lab 10/20/17 1912  NA 140  K 3.8  CL 106  CO2 23  GLUCOSE 93  BUN 10  CREATININE 0.91  CALCIUM 8.5*   GFR: Estimated Creatinine Clearance: 106.8 mL/min (by C-G formula based on SCr of 0.91 mg/dL). Liver Function Tests: Recent Labs  Lab 10/20/17 1912  AST 33  ALT 20  ALKPHOS 89  BILITOT 0.6  PROT 7.5  ALBUMIN 4.2  No results for input(s): LIPASE, AMYLASE in the last 168 hours. No results for input(s): AMMONIA in the last 168 hours. Coagulation Profile: No results for input(s): INR, PROTIME in the last 168 hours. Cardiac Enzymes: No results for input(s): CKTOTAL, CKMB, CKMBINDEX, TROPONINI in the last 168 hours. BNP (last 3 results) No results for input(s): PROBNP in the last 8760 hours. HbA1C: No results for input(s): HGBA1C in the last 72 hours. CBG: Recent Labs  Lab 10/20/17 1807  GLUCAP 97   Lipid Profile: No results for input(s): CHOL, HDL, LDLCALC, TRIG, CHOLHDL, LDLDIRECT in the last 72 hours. Thyroid Function Tests: No results for input(s): TSH, T4TOTAL, FREET4, T3FREE, THYROIDAB in the last 72 hours. Anemia Panel: No results for input(s): VITAMINB12, FOLATE, FERRITIN, TIBC, IRON, RETICCTPCT in the last 72 hours. Urine analysis:    Component Value Date/Time   COLORURINE YELLOW 06/07/2013 1905   APPEARANCEUR CLEAR 06/07/2013 1905   LABSPEC 1.020 06/07/2013 1905   PHURINE 6.0 06/07/2013 1905   GLUCOSEU NEGATIVE 06/07/2013 1905   HGBUR NEGATIVE 06/07/2013 1905   BILIRUBINUR NEGATIVE 06/07/2013 1905   KETONESUR 40 (A) 06/07/2013 1905   PROTEINUR NEGATIVE 06/07/2013 1905   UROBILINOGEN 0.2 06/07/2013 1905   NITRITE NEGATIVE 06/07/2013 1905   LEUKOCYTESUR NEGATIVE 06/07/2013 1905   Sepsis Labs: @LABRCNTIP (procalcitonin:4,lacticidven:4) )No results found for  this or any previous visit (from the past 240 hour(s)).   Radiological Exams on Admission: Ct Head Wo Contrast  Result Date: 10/20/2017 CLINICAL DATA:  Seizure, possible overdose EXAM: CT HEAD WITHOUT CONTRAST TECHNIQUE: Contiguous axial images were obtained from the base of the skull through the vertex without intravenous contrast. COMPARISON:  12/14/2016, 10/04/2013 FINDINGS: Brain: No acute territorial infarction, hemorrhage, or intracranial mass is visualized. Old lacunar infarcts in the right cerebellum and left caudate. Normal ventricle size. Vascular: No hyperdense vessel or unexpected calcification. Skull: Normal. Negative for fracture or focal lesion. Sinuses/Orbits: No acute finding. Other: None IMPRESSION: No CT evidence for acute intracranial abnormality. Electronically Signed   By: Jasmine PangKim  Fujinaga M.D.   On: 10/20/2017 19:06    EKG: Independently reviewed.  Sinus tachycardia with QTC of 458 ms QRS of 99 ms.  Assessment/Plan Principal Problem:   Drug overdose, intentional, initial encounter (HCC) Active Problems:   Depression   MDD (major depressive disorder), recurrent episode, severe (HCC)   Alcohol dependence (HCC)   Drug overdose, intentional self-harm, initial encounter (HCC)   Seizures (HCC)   Drug overdose    1. Intentional drug overdose with Seroquel and hydroxyzine -as per the ER physician patient had taken around 30 tablets of 300 mg Seroquel and also possible hydroxyzine.  Patient also drank alcohol.  ER physician had discussed with neurologist Dr. Amada JupiterKirkpatrick who advised patient to be placed on Depakote loading dose and I have further discussed with neurologist who recommended after the loading dose patient to be on Depakote 500 mg IV every 12.  Neurologist will be seeing patient in consult.  I have discussed with poison control who recommended to repeat EKG in the morning.  And if EKG is showing prolonged QRS more than 120 ms then will need sodium bicarbonate boluses  until QRS narrows less than 120 ms.  If it does not improve with multiple doses and will need to consult poison control.  If QTC is prolonged then needs to check potassium and magnesium. 2. Depression with suicidal ideation -presently hold medications on hold.  Consult psychiatry when patient is more alert awake. 3. Alcohol intoxication and abuse -patient on  CIWA protocol.  Neurologist has recommended [facility].   DVT prophylaxis: SCDs. Code Status: Full code. Family Communication: Patient's girlfriend at the bedside. Disposition Plan: To be determined. Consults called: Neurologist. Admission status: Inpatient.   Eduard ClosKAKRAKANDY,Teaira Croft N. MD [facility] Pager [phone].  If 7PM-7AM, please contact night-coverage [email or web address] Password [email or web address]  10/20/2017, 9:59 PM

## 2017-10-20 NOTE — ED Notes (Addendum)
Pt and this RN returned from CT. Pt became more unresponsive and sleepy while in CT. Dr. Criss AlvineGoldston made aware. Pt placed on 2 L oxygen for comfort while sleeping.

## 2017-10-20 NOTE — ED Notes (Signed)
Bed: [id number] Expected date:  Expected time:  Means of arrival:  Comments: 40s overdose on Seroquel

## 2017-10-20 NOTE — ED Notes (Signed)
Dr. Criss AlvineGoldston at bedside. Pt had seizure that lasted about 1 minute 30 seconds. Pt is alert and oriented x4.

## 2017-10-20 NOTE — ED Notes (Signed)
Pt started seizing with tech in room. This RN was notified and then notified Dr. Criss AlvineGoldston. New orders of 2mg  ativan given.

## 2017-10-21 ENCOUNTER — Inpatient Hospital Stay (HOSPITAL_COMMUNITY): Payer: Self-pay

## 2017-10-21 ENCOUNTER — Encounter (HOSPITAL_COMMUNITY): Payer: Self-pay

## 2017-10-21 ENCOUNTER — Other Ambulatory Visit: Payer: Self-pay

## 2017-10-21 DIAGNOSIS — R569 Unspecified convulsions: Secondary | ICD-10-CM

## 2017-10-21 DIAGNOSIS — I6381 Other cerebral infarction due to occlusion or stenosis of small artery: Secondary | ICD-10-CM | POA: Diagnosis present

## 2017-10-21 DIAGNOSIS — T50902A Poisoning by unspecified drugs, medicaments and biological substances, intentional self-harm, initial encounter: Secondary | ICD-10-CM

## 2017-10-21 LAB — CBC WITH DIFFERENTIAL/PLATELET
Basophils Absolute: 0 10*3/uL (ref 0.0–0.1)
Basophils Relative: 0 %
EOS ABS: 0 10*3/uL (ref 0.0–0.7)
EOS PCT: 0 %
HCT: 42.9 % (ref 39.0–52.0)
Hemoglobin: 14.7 g/dL (ref 13.0–17.0)
LYMPHS ABS: 1.5 10*3/uL (ref 0.7–4.0)
LYMPHS PCT: 10 %
MCH: 33.4 pg (ref 26.0–34.0)
MCHC: 34.3 g/dL (ref 30.0–36.0)
MCV: 97.5 fL (ref 78.0–100.0)
MONO ABS: 0.4 10*3/uL (ref 0.1–1.0)
MONOS PCT: 3 %
Neutro Abs: 13.3 10*3/uL — ABNORMAL HIGH (ref 1.7–7.7)
Neutrophils Relative %: 87 %
PLATELETS: 190 10*3/uL (ref 150–400)
RBC: 4.4 MIL/uL (ref 4.22–5.81)
RDW: 15.2 % (ref 11.5–15.5)
WBC: 15.3 10*3/uL — ABNORMAL HIGH (ref 4.0–10.5)

## 2017-10-21 LAB — HEPATIC FUNCTION PANEL
ALBUMIN: 3.8 g/dL (ref 3.5–5.0)
ALT: 22 U/L (ref 17–63)
AST: 38 U/L (ref 15–41)
Alkaline Phosphatase: 81 U/L (ref 38–126)
BILIRUBIN INDIRECT: 0.5 mg/dL (ref 0.3–0.9)
BILIRUBIN TOTAL: 0.7 mg/dL (ref 0.3–1.2)
Bilirubin, Direct: 0.2 mg/dL (ref 0.1–0.5)
Total Protein: 6.8 g/dL (ref 6.5–8.1)

## 2017-10-21 LAB — BASIC METABOLIC PANEL
Anion gap: 11 (ref 5–15)
BUN: 15 mg/dL (ref 6–20)
CHLORIDE: 105 mmol/L (ref 101–111)
CO2: 22 mmol/L (ref 22–32)
CREATININE: 1.02 mg/dL (ref 0.61–1.24)
Calcium: 8.4 mg/dL — ABNORMAL LOW (ref 8.9–10.3)
GFR calc Af Amer: >60 mL/min (ref >60)
GFR calc non Af Amer: >60 mL/min (ref >60)
GLUCOSE: 123 mg/dL — AB (ref 65–99)
POTASSIUM: 4 mmol/L (ref 3.5–5.1)
SODIUM: 138 mmol/L (ref 135–145)

## 2017-10-21 LAB — CBG MONITORING, ED
GLUCOSE-CAPILLARY: 115 mg/dL — AB (ref 65–99)
GLUCOSE-CAPILLARY: 78 mg/dL (ref 65–99)

## 2017-10-21 LAB — GLUCOSE, CAPILLARY
Glucose-Capillary: 76 mg/dL (ref 65–99)
Glucose-Capillary: 114 mg/dL — ABNORMAL HIGH (ref 65–99)

## 2017-10-21 LAB — TSH: TSH: 0.288 u[IU]/mL — ABNORMAL LOW (ref 0.350–4.500)

## 2017-10-21 LAB — HIV ANTIBODY (ROUTINE TESTING W REFLEX): HIV Screen 4th Generation wRfx: NONREACTIVE

## 2017-10-21 MED ORDER — CLOPIDOGREL BISULFATE 75 MG PO TABS
75.0000 mg | ORAL_TABLET | Freq: Every day | ORAL | Status: DC
Start: 2017-10-22 — End: 2017-10-27
  Administered 2017-10-22 – 2017-10-27 (×6): 75 mg via ORAL
  Filled 2017-10-21 (×6): qty 1

## 2017-10-21 MED ORDER — ACETAMINOPHEN 325 MG PO TABS
650.0000 mg | ORAL_TABLET | Freq: Four times a day (QID) | ORAL | Status: DC | PRN
  Administered 2017-10-21 – 2017-10-23 (×3): 650 mg via ORAL
  Filled 2017-10-21 (×3): qty 2

## 2017-10-21 MED ORDER — CLOPIDOGREL BISULFATE 75 MG PO TABS
75.0000 mg | ORAL_TABLET | Freq: Every day | ORAL | Status: DC
Start: 2017-10-21 — End: 2017-10-21

## 2017-10-21 MED ORDER — DIVALPROEX SODIUM 500 MG PO DR TAB
500.0000 mg | DELAYED_RELEASE_TABLET | Freq: Two times a day (BID) | ORAL | Status: DC
  Administered 2017-10-21 – 2017-10-27 (×12): 500 mg via ORAL
  Filled 2017-10-21 (×13): qty 1

## 2017-10-21 MED ORDER — NICOTINE 21 MG/24HR TD PT24
21.0000 mg | MEDICATED_PATCH | Freq: Every day | TRANSDERMAL | Status: DC
  Administered 2017-10-21 – 2017-10-27 (×7): 21 mg via TRANSDERMAL
  Filled 2017-10-21 (×7): qty 1

## 2017-10-21 MED ORDER — GADOBENATE DIMEGLUMINE 529 MG/ML IV SOLN
18.0000 mL | Freq: Once | INTRAVENOUS | Status: AC | PRN
  Administered 2017-10-21: 18 mL via INTRAVENOUS

## 2017-10-21 MED ORDER — NICOTINE 14 MG/24HR TD PT24
14.0000 mg | MEDICATED_PATCH | Freq: Every day | TRANSDERMAL | Status: DC
  Administered 2017-10-21: 14 mg via TRANSDERMAL
  Filled 2017-10-21: qty 1

## 2017-10-21 NOTE — Consult Note (Addendum)
NEURO HOSPITALIST CONSULT NOTE   Requestig physician: Dr. Caleb PoppNettey   Reason for Consult: Seizures   History obtained from:  Patient     HPI:                                                                                                                                          Nathaniel Warren is an 51 y.o. male who was brought to the hospital secondary to attempted overdose on Seroquel and hydroxyzine.  Patient states that he has been on antiepileptics since 2011 when he had his first seizure.  The antiepileptic at that time was Keppra.  He was doing well on Keppra at that time. "States that he was on this medication for quite a long time and this was due to a stroke"  however I looked back in the notes I cannot find any stroke that was obtained on MRI in 2000 10/11.    Again in 2016 patient states that he stopped taking his medication and had a large seizure "that seizure consisted of a sensation of tunnel vision while walking to a food at [employer] and the next thing he remembers is that EMS had him sitting in a booth.  He states that he was alert after waking up."  He states he has not been on Keppra for quite some time at least 2 weeks due to no refills from [facility].  He does admit to drinking a pint of alcohol 2 days ago but states he was not drinking any alcohol prior to that and did not feel any withdrawal symptoms.  On the emergency department he was loaded with Depakote and will receive his first maintenance dose of Depakote today.  He has had no further seizures overnight.  Past Medical History:  Diagnosis Date  . Bipolar 1 disorder (HCC)   . Depression   . Seizures (HCC)   . Stroke [facility](HCC)     Past Surgical History:  Procedure Laterality Date  . ANKLE SURGERY    . SPLENECTOMY      Family History  Problem Relation Age of Onset  . Hypertension Other      Social History:  reports that he has been smoking cigarettes.  He has a 30.00 pack-year smoking  history. he has never used smokeless tobacco. He reports that he drinks alcohol. He reports that he does not use drugs.  Allergies  Allergen Reactions  . Asa [Aspirin] Other (See Comments)    As a child, had nose bleed.     MEDICATIONS:  Prior to Admission:  Medications Prior to Admission  Medication Sig Dispense Refill Last Dose  . albuterol (PROVENTIL HFA;VENTOLIN HFA) 108 (90 BASE) MCG/ACT inhaler Inhale 2 puffs into the lungs every 4 (four) hours as needed for wheezing. 1 Inhaler 0 10/20/2017 at Unknown time  . busPIRone (BUSPAR) 10 MG tablet Take 1 tablet (10 mg total) by mouth 3 (three) times daily. For anxiety symptoms 90 tablet 0 10/20/2017 at Unknown time  . gabapentin (NEURONTIN) 300 MG capsule Take 2 capsules (600 mg total) by mouth 3 (three) times daily. For anxiety symptoms (Patient taking differently: Take 300 mg 3 (three) times daily by mouth. For anxiety symptoms) 180 capsule 0 10/20/2017 at Unknown time  . hydrOXYzine (ATARAX/VISTARIL) 50 MG tablet Take 50 mg 4 (four) times daily as needed by mouth for anxiety.   10/20/2017 at Unknown time  . ibuprofen (ADVIL,MOTRIN) 200 MG tablet Take 600 mg by mouth every 6 (six) hours as needed for mild pain.   Past Month at Unknown time  . lithium carbonate 300 MG capsule Take 600 mg at bedtime by mouth.   10/20/2017 at Unknown time  . QUEtiapine (SEROQUEL) 25 MG tablet Take 25 mg 2 (two) times daily by mouth. FOR AGITATION, ANXIETY, AND INSOMNIA   10/20/2017 at Unknown time  . QUEtiapine (SEROQUEL) 300 MG tablet Take 300 mg at bedtime by mouth.   10/20/2017 at Unknown time  . traZODone (DESYREL) 100 MG tablet Take 100 mg at bedtime as needed by mouth (ANXIETY).    10/20/2017 at Unknown time   Scheduled: . folic acid  1 mg Oral Daily  . multivitamin with minerals  1 tablet Oral Daily  . nicotine  14 mg Transdermal Daily  .  thiamine  100 mg Oral Daily   Or  . thiamine  100 mg Intravenous Daily     ROS:                                                                                                                                       History obtained from the patient  General ROS: negative for - chills, fatigue, fever, night sweats, weight gain or weight loss Psychological ROS: negative for - behavioral disorder, hallucinations, memory difficulties, mood swings or suicidal ideation Ophthalmic ROS: negative for - blurry vision, double vision, eye pain or loss of vision ENT ROS: negative for - epistaxis, nasal discharge, oral lesions, sore throat, tinnitus or vertigo Allergy and Immunology ROS: negative for - hives or itchy/watery eyes Hematological and Lymphatic ROS: negative for - bleeding problems, bruising or swollen lymph nodes Endocrine ROS: negative for - galactorrhea, hair pattern changes, polydipsia/polyuria or temperature intolerance Respiratory ROS: negative for - cough, hemoptysis, shortness of breath or wheezing Cardiovascular ROS: negative for - chest pain, dyspnea on exertion, edema or irregular heartbeat Gastrointestinal ROS: negative for - abdominal pain, diarrhea, hematemesis, nausea/vomiting or stool incontinence Genito-Urinary ROS: negative for -  dysuria, hematuria, incontinence or urinary frequency/urgency Musculoskeletal ROS: negative for - joint swelling or muscular weakness Neurological ROS: as noted in HPI Dermatological ROS: negative for rash and skin lesion changes   Blood pressure 131/70, pulse 91, temperature 98.5 F (36.9 C), temperature source Oral, resp. rate 18, height 5\' 9"  (1.753 m), weight 87.1 kg (192 lb 1.6 oz), SpO2 93 %.   Neurologic Examination:                                                                                                      HEENT-  Normocephalic, no lesions, without obvious abnormality.  Normal external eye and conjunctiva.  Normal TM's  bilaterally.  Normal auditory canals and external ears. Normal external nose, mucus membranes and septum.  Normal pharynx. Cardiovascular- S1, S2 normal, pulses palpable throughout   Lungs- chest clear, no wheezing, rales, normal symmetric air entry Abdomen- normal findings: bowel sounds normal Extremities- no edema Lymph-no adenopathy palpable Musculoskeletal-no joint tenderness, deformity or swelling Skin-warm and dry, no hyperpigmentation, vitiligo, or suspicious lesions  Neurological Examination Mental Status: Alert, oriented, thought content appropriate.  Speech fluent without evidence of aphasia.  Able to follow 3 step commands without difficulty. Cranial Nerves: II:  Visual fields grossly normal,  III,IV, VI: ptosis not present, extra-ocular motions intact bilaterally pupils equal, round, reactive to light and accommodation V,VII: smile symmetric, facial light touch sensation normal bilaterally VIII: hearing normal bilaterally IX,X: uvula rises symmetrically XI: bilateral shoulder shrug XII: midline tongue extension Motor: Right : Upper extremity   5/5    Left:     Upper extremity   5/5  Lower extremity   5/5     Lower extremity   5/5 Tone and bulk:normal tone throughout; no atrophy noted Sensory: Pinprick and light touch intact throughout, bilaterally Deep Tendon Reflexes: 2+ and symmetric throughout Plantars: Right: downgoing   Left: downgoing Cerebellar: normal finger-to-nose, normal rapid alternating movements and normal heel-to-shin test Gait: Not tested      Lab Results: Basic Metabolic Panel: Recent Labs  Lab 10/20/17 1912 10/21/17 0539  NA 140 138  K 3.8 4.0  CL 106 105  CO2 23 22  GLUCOSE 93 123*  BUN 10 15  CREATININE 0.91 1.02  CALCIUM 8.5* 8.4*    Liver Function Tests: Recent Labs  Lab 10/20/17 1912 10/21/17 0539  AST 33 38  ALT 20 22  ALKPHOS 89 81  BILITOT 0.6 0.7  PROT 7.5 6.8  ALBUMIN 4.2 3.8   No results for input(s): LIPASE,  AMYLASE in the last 168 hours. No results for input(s): AMMONIA in the last 168 hours.  CBC: Recent Labs  Lab 10/20/17 1830 10/21/17 0539  WBC 8.1 15.3*  NEUTROABS 4.0 13.3*  HGB 16.5 14.7  HCT 47.1 42.9  MCV 96.1 97.5  PLT 212 190    Cardiac Enzymes: No results for input(s): CKTOTAL, CKMB, CKMBINDEX, TROPONINI in the last 168 hours.  Lipid Panel: No results for input(s): CHOL, TRIG, HDL, CHOLHDL, VLDL, LDLCALC in the last 168 hours.  CBG: Recent Labs  Lab 10/20/17 1807 10/21/17 0037  10/21/17 0552  GLUCAP 97 78 115*    Microbiology: Results for orders placed or performed during the hospital encounter of 02/26/11  Urine culture     Status: None   Collection Time: 02/26/11  8:18 AM  Result Value Ref Range Status   Specimen Description URINE, RANDOM  Final   Special Requests NONE  Final   Culture  Setup Time [id number]  Final   Colony Count 4,000 COLONIES/ML  Final   Culture INSIGNIFICANT GROWTH  Final   Report Status 02/27/2011 FINAL  Final    Coagulation Studies: No results for input(s): LABPROT, INR in the last 72 hours.  Imaging: Ct Head Wo Contrast  Result Date: 10/20/2017 CLINICAL DATA:  Seizure, possible overdose EXAM: CT HEAD WITHOUT CONTRAST TECHNIQUE: Contiguous axial images were obtained from the base of the skull through the vertex without intravenous contrast. COMPARISON:  12/14/2016, 10/04/2013 FINDINGS: Brain: No acute territorial infarction, hemorrhage, or intracranial mass is visualized. Old lacunar infarcts in the right cerebellum and left caudate. Normal ventricle size. Vascular: No hyperdense vessel or unexpected calcification. Skull: Normal. Negative for fracture or focal lesion. Sinuses/Orbits: No acute finding. Other: None IMPRESSION: No CT evidence for acute intracranial abnormality. Electronically Signed   By: Jasmine Pang M.D.   On: 10/20/2017 19:06       Assessment and plan per attending neurologist  Felicie Morn PA-C [facility] [phone]  10/21/2017, 10:02 AM   Assessment/Plan: 51 year old male with a history of seizures with recurrent seizures in the setting of medication noncompliance and neuroleptic overdose.  Neuroleptics do lower seizure threshold, and so I do suspect that this played a role.  He is currently seizure-free on Depakote.  He was on Keppra, but given his history of bipolar disorder, I think that Depakote would be a better fit for him.  He seems to be tolerating this well.  1) Depakote 500 mg twice daily 2) given a history of stroke, he should be on a antiplatelet, he will need to follow-up with his PCP for secondary stroke prevention measures(e.g. cholesterol control with LDL goal less than 70, hypertension control, etc.) 3) no further recommendations at this time, please call with further questions or concerns.  Ritta Slot, MD [facility] [phone]  If 7pm- 7am, please page neurology on call as listed in AMION.

## 2017-10-21 NOTE — Progress Notes (Signed)
Pt off floor accompanied with sitter to MRI.

## 2017-10-21 NOTE — ED Notes (Signed)
[facility] called. 

## 2017-10-21 NOTE — Progress Notes (Signed)
PROGRESS NOTE  [name]:[id number] DOB: 01-Sep-1966 DOA: 10/20/2017 PCP: System, Pcp Not In  HPI/Recap of past 24 hours:  Nathaniel Warren is a 51 y.o. year old male with medical history significant for bipolar disorder, seizure disorder and alcoholism  who presented on 10/20/2017 with concerns for suicide attempt with overdose of Seroquel and hydroxyzine. Of note, patient was recently admitted to inpatient psychiatric facility on 10/18 for suicide attempt.  Patient had witnessed seizure outside of the hospital and again while in the ED requiring Ativan 2 mg IV. Neurology was consulted and placed patient on Depakote. UDS was positive for benzodiazepines.  EKG showed no prolongation in QtC.  Brain MRI on 11/14 showed no acute intracranial abnormality   In the past 24 hours,has been without any witnessed seizures.    This morning he still reports feeling down. Girlfriend at bedside concerned for safety  Assessment/Plan: Principal Problem:   Drug overdose, intentional, initial encounter (HCC) Active Problems:   Depression   MDD (major depressive disorder), recurrent episode, severe (HCC)   Alcohol dependence (HCC)   Drug overdose, intentional self-harm, initial encounter (HCC)   Seizures (HCC)   Drug overdose   Multiple lacunar infarcts    Intentional Overdose ( Seroquel and hydroxyzine). 2nd suicide attempt in less than a month. Patient will likely need inpatient psychiatric stay. Will consult psych once medically stable ( seizure free, MRI brain already ruled out other organic causes).  -EKG shows no prolonged Qtc  Witnessed Seizure. Stable. Most likely related to Seroquel overdose. MRI brain shows no organic cause. Patient does have known seizure history and other concerning risk factors including alcohol abuse. - neurology recommends depakote given bipolar history -seizure precautions  Bipolar Disorder, depression with suicidal ideation. Likely not best controlled given  recurrent suicide attempts. Patient currently homeless and not adherent to medication regimen per girlfriend - depakote for now - will consult psych in the am as long as medically stable  Ethanol abuse. CIWA protocol  Remote Lacunar infarcts ( on MRI Brain). Given aspirin allergy ( nosebleeds as child) neurology advises plavix.   Code Status: Full Code   Family Communication: Girlfriend at bedside and updated   Disposition Plan: Psychiatric consult on 11/15 given SI, bedside sitter currently     Consultants:  Neurology   Procedures:  None   Antimicrobials:  None   Cultures:  None  DVT prophylaxis: No SCDs given SI risk.    Objective: Vitals:   10/21/17 0630 10/21/17 0645 10/21/17 0841 10/21/17 1225  BP: 111/62 (!) 111/58 131/70 114/67  Pulse: 82 85 91 86  Resp: 18 18 18 18   Temp:   98.5 F (36.9 C) 98.7 F (37.1 C)  TempSrc:   Oral Oral  SpO2: 95% 96% 93% 95%  Weight:   87.1 kg (192 lb 1.6 oz)   Height:   5\' 9"  (1.753 m)     Intake/Output Summary (Last 24 hours) at 10/21/2017 1824 Last data filed at 10/21/2017 1505 Gross per 24 hour  Intake 2611.67 ml  Output 400 ml  Net 2211.67 ml   Filed Weights   10/20/17 1835 10/21/17 0841  Weight: 86.2 kg (190 lb) 87.1 kg (192 lb 1.6 oz)    Exam:   General:  Lying in bed   Skin: clean,dry, intact  Neurologic: moving all extremities without focal deficits  Psychiatry: flat affect, depressed    Data Reviewed: CBC: Recent Labs  Lab 10/20/17 1830 10/21/17 0539  WBC 8.1 15.3*  NEUTROABS  4.0 13.3*  HGB 16.5 14.7  HCT 47.1 42.9  MCV 96.1 97.5  PLT 212 190   Basic Metabolic Panel: Recent Labs  Lab 10/20/17 1912 10/21/17 0539  NA 140 138  K 3.8 4.0  CL 106 105  CO2 23 22  GLUCOSE 93 123*  BUN 10 15  CREATININE 0.91 1.02  CALCIUM 8.5* 8.4*   GFR: Estimated Creatinine Clearance: 95.8 mL/min (by C-G formula based on SCr of 1.02 mg/dL). Liver Function Tests: Recent Labs  Lab  10/20/17 1912 10/21/17 0539  AST 33 38  ALT 20 22  ALKPHOS 89 81  BILITOT 0.6 0.7  PROT 7.5 6.8  ALBUMIN 4.2 3.8   No results for input(s): LIPASE, AMYLASE in the last 168 hours. No results for input(s): AMMONIA in the last 168 hours. Coagulation Profile: No results for input(s): INR, PROTIME in the last 168 hours. Cardiac Enzymes: No results for input(s): CKTOTAL, CKMB, CKMBINDEX, TROPONINI in the last 168 hours. BNP (last 3 results) No results for input(s): PROBNP in the last 8760 hours. HbA1C: No results for input(s): HGBA1C in the last 72 hours. CBG: Recent Labs  Lab 10/20/17 1807 10/21/17 0037 10/21/17 0552 10/21/17 1236  GLUCAP 97 78 115* 76   Lipid Profile: No results for input(s): CHOL, HDL, LDLCALC, TRIG, CHOLHDL, LDLDIRECT in the last 72 hours. Thyroid Function Tests: Recent Labs    10/21/17 0539  TSH 0.288*   Anemia Panel: No results for input(s): VITAMINB12, FOLATE, FERRITIN, TIBC, IRON, RETICCTPCT in the last 72 hours. Urine analysis:    Component Value Date/Time   COLORURINE YELLOW 10/20/2017 2138   APPEARANCEUR CLEAR 10/20/2017 2138   LABSPEC 1.017 10/20/2017 2138   PHURINE 5.0 10/20/2017 2138   GLUCOSEU NEGATIVE 10/20/2017 2138   HGBUR NEGATIVE 10/20/2017 2138   BILIRUBINUR NEGATIVE 10/20/2017 2138   KETONESUR 5 (A) 10/20/2017 2138   PROTEINUR NEGATIVE 10/20/2017 2138   UROBILINOGEN 0.2 06/07/2013 1905   NITRITE NEGATIVE 10/20/2017 2138   LEUKOCYTESUR NEGATIVE 10/20/2017 2138   Sepsis Labs: @LABRCNTIP (procalcitonin:4,lacticidven:4)  )No results found for this or any previous visit (from the past 240 hour(s)).    Studies: Ct Head Wo Contrast  Result Date: 10/20/2017 CLINICAL DATA:  Seizure, possible overdose EXAM: CT HEAD WITHOUT CONTRAST TECHNIQUE: Contiguous axial images were obtained from the base of the skull through the vertex without intravenous contrast. COMPARISON:  12/14/2016, 10/04/2013 FINDINGS: Brain: No acute territorial  infarction, hemorrhage, or intracranial mass is visualized. Old lacunar infarcts in the right cerebellum and left caudate. Normal ventricle size. Vascular: No hyperdense vessel or unexpected calcification. Skull: Normal. Negative for fracture or focal lesion. Sinuses/Orbits: No acute finding. Other: None IMPRESSION: No CT evidence for acute intracranial abnormality. Electronically Signed   By: Jasmine PangKim  Fujinaga M.D.   On: 10/20/2017 19:06   Mr Laqueta JeanBrain W WUWo Contrast  Result Date: 10/21/2017 CLINICAL DATA:  Seizure comminuted, drug related, nontraumatic. Overdose was Seroquel. Two witnessed seizures. EXAM: MRI HEAD WITHOUT AND WITH CONTRAST TECHNIQUE: Multiplanar, multiecho pulse sequences of the brain and surrounding structures were obtained without and with intravenous contrast. CONTRAST:  18mL MULTIHANCE GADOBENATE DIMEGLUMINE 529 MG/ML IV SOLN COMPARISON:  MRI brain 12/14/2016.  CT head 10/20/2017. FINDINGS: Brain: No acute infarct, hemorrhage, or mass lesion is present. A remote lacunar infarct in the left caudate is stable. Remote lacunar infarcts of the cerebellum are stable. No new infarcts are present. The ventricles are of normal size. Dedicated imaging of the temporal lobes demonstrates symmetric size and signal of the hippocampal  structures bilaterally. Postcontrast images demonstrate no pathologic enhancement. Vascular: Flow is present in the major intracranial arteries. A benign developmental venous anomaly is present in the medial right parietal lobe near the splenium of the corpus callosum. Skull and upper cervical spine: The skullbase is within normal limits. The craniocervical junction is normal. Marrow signal is normal. Sinuses/Orbits: Chronic left maxillary and ethmoid mucosal thickening is present. The remaining paranasal sinuses are clear. The mastoid air cells are clear bilaterally. No obstructing nasopharyngeal lesion is present. The globes and orbits are within normal limits. IMPRESSION: 1. No  acute or focal lesion to explain seizures. 2. Remote lacunar infarcts are stable. 3. Left maxillary and ethmoid mucosal disease as described. Electronically Signed   By: Marin Robertshristopher  Mattern M.D.   On: 10/21/2017 14:39    Scheduled Meds: . [START ON 10/22/2017] clopidogrel  75 mg Oral Daily  . divalproex  500 mg Oral Q12H  . folic acid  1 mg Oral Daily  . multivitamin with minerals  1 tablet Oral Daily  . nicotine  21 mg Transdermal Daily  . thiamine  100 mg Oral Daily   Or  . thiamine  100 mg Intravenous Daily    Continuous Infusions: . sodium chloride 125 mL/hr at 10/21/17 1521     LOS: 1 day     Laverna PeaceShayla D Nettey, MD [facility] Pager [phone]  If 7PM-7AM, please contact night-coverage [email or web address] Password [email or web address] 10/21/2017, 6:24 PM

## 2017-10-22 DIAGNOSIS — T43592A Poisoning by other antipsychotics and neuroleptics, intentional self-harm, initial encounter: Principal | ICD-10-CM

## 2017-10-22 DIAGNOSIS — T1491XA Suicide attempt, initial encounter: Secondary | ICD-10-CM

## 2017-10-22 DIAGNOSIS — Z590 Homelessness: Secondary | ICD-10-CM

## 2017-10-22 DIAGNOSIS — F17210 Nicotine dependence, cigarettes, uncomplicated: Secondary | ICD-10-CM

## 2017-10-22 LAB — GLUCOSE, CAPILLARY
Glucose-Capillary: 90 mg/dL (ref 65–99)
Glucose-Capillary: 111 mg/dL — ABNORMAL HIGH (ref 65–99)
Glucose-Capillary: 139 mg/dL — ABNORMAL HIGH (ref 65–99)

## 2017-10-22 MED ORDER — BUSPIRONE HCL 10 MG PO TABS
10.0000 mg | ORAL_TABLET | Freq: Three times a day (TID) | ORAL | Status: DC
  Administered 2017-10-22 – 2017-10-27 (×14): 10 mg via ORAL
  Filled 2017-10-22 (×14): qty 1

## 2017-10-22 MED ORDER — TRAZODONE HCL 100 MG PO TABS
100.0000 mg | ORAL_TABLET | Freq: Every evening | ORAL | Status: DC | PRN
  Administered 2017-10-22 – 2017-10-27 (×3): 100 mg via ORAL
  Filled 2017-10-22 (×4): qty 1

## 2017-10-22 MED ORDER — HYDROXYZINE HCL 25 MG PO TABS
50.0000 mg | ORAL_TABLET | Freq: Four times a day (QID) | ORAL | Status: DC | PRN
  Administered 2017-10-23 – 2017-10-27 (×6): 50 mg via ORAL
  Filled 2017-10-22 (×6): qty 2

## 2017-10-22 MED ORDER — LITHIUM CARBONATE 300 MG PO CAPS
600.0000 mg | ORAL_CAPSULE | Freq: Every day | ORAL | Status: DC
  Administered 2017-10-22 – 2017-10-26 (×5): 600 mg via ORAL
  Filled 2017-10-22 (×6): qty 2

## 2017-10-22 NOTE — Consult Note (Signed)
[facility] Consult   Reason for Consult:  Suicide attempt by Seroquel and Atarax overdose Referring Physician:  Dr. Lonny Prude Patient Identification: Nathaniel Warren MRN:  [id number] Principal Diagnosis: MDD (major depressive disorder), recurrent severe, without psychosis ([place]) Diagnosis:   Patient Active Problem List   Diagnosis Date Noted  . Multiple lacunar infarcts [I63.81] 10/21/2017  . Drug overdose, intentional, initial encounter ([place]) [T50.902A] 10/20/2017  . Drug overdose, intentional self-harm, initial encounter ([place]) [T50.902A] 10/20/2017  . Seizures ([place]) [R56.9] 10/20/2017  . Drug overdose [T50.901A] 10/20/2017  . Alcohol dependence ([place]) [F10.20] 06/19/2013    Class: Acute  . MDD (major depressive disorder), recurrent episode, severe ([place]) [F33.2] 04/10/2013    Class: Acute  . GAD (generalized anxiety disorder) [F41.1] 04/10/2013  . Depression [F32.9] 01/11/2013  . Anxiety [F41.9] 01/11/2013    Total Time spent with patient: 1 hour  Subjective:   Nathaniel Warren is a 51 y.o. male patient admitted for suicide attempt by Seroquel and Atarax overdose.  HPI:   Per chart review, Nathaniel Warren has a history of bipolar disorder and alcoholism. He overdosed on Seroquel and Atarax on 11/13 prior to hospital admission. He had a witnessed seizure outside the hospital and again in the ED which required Ativan. UDS was positive for benzodiazepines. BAL was 305 on admission. Neurology was consulted and he was placed on Depakote. He was previously admitted for inpatient hospitalization on 10/18 for suicide attempt.   On interview, Nathaniel Warren reports that he is depressed. He has felt like a failure for the past 2 months. He reports homelessness, unemployment and lack of transportation. He was previously working in [employer]. He reports SI for the past 2 months. He is unable to identify any triggers for his suicide attempt by overdose prior to admission. He is unsure how  much Seroquel or Atarax he took. He does reports drinking a pint of liquor that day after maintaining sobriety for a year. He was last admitted to [facility] 2 weeks ago for SI. He reports that he was started on his current medication regimen which helped but then his mood declined after discharge when follow up plans did not happen as planned. He was not able to get into the shelter he was supposed to go to. He did follow up with [facility] as planned. He is taking Seroquel 300 mg qhs and 25 mg BID, Buspar 10 mg QID, Atarax 50 mg QID, Lithium 600 mg qhs and Trazodone 100 mg qhs. He reports compliance with his medications. He reports poor sleep, poor appetite and anhedonia. He denies current SI, HI or AVH.   Past Psychiatric History: MDD, GAD and multiple suicide attempts by overdose.  Risk to Self: Yes given recent suicide attempt. Denies current SI. Risk to Others:  None. Denies HI.  Prior Inpatient Therapy:  He has had multiple prior hospitalizations. His most recent hospitalization was 2 weeks ago at [facility].  Prior Outpatient Therapy:  He goes to [facility] for medication management.   Past Medical History:  Past Medical History:  Diagnosis Date  . Bipolar 1 disorder ([place])   . Depression   . Seizures ([place])   . Stroke [facility])     Past Surgical History:  Procedure Laterality Date  . ANKLE SURGERY    . SPLENECTOMY     Family History:  Family History  Problem Relation Age of Onset  . Hypertension Other    Family Psychiatric  History: Mother-attempted suicide twice and father-alcoholism. Social History:  Social History   Substance and Sexual Activity  Alcohol Use Yes   Comment: lasr drink 06/19/13 Presently in [facility], last use this morning 09/07/17     Social History   Substance and Sexual Activity  Drug Use No   Comment: teenager used THC, only one occasion    Social History   Socioeconomic History  . Marital status: Married    Spouse name: None  . Number of  children: None  . Years of education: None  . Highest education level: None  Social Needs  . Financial resource strain: None  . Food insecurity - worry: None  . Food insecurity - inability: None  . Transportation needs - medical: None  . Transportation needs - non-medical: None  Occupational History  . None  Tobacco Use  . Smoking status: Current Every Day Smoker    Packs/day: 1.00    Years: 30.00    Pack years: 30.00    Types: Cigarettes  . Smokeless tobacco: Never Used  Substance and Sexual Activity  . Alcohol use: Yes    Comment: lasr drink 06/19/13 Presently in [facility], last use this morning 09/07/17  . Drug use: No    Comment: teenager used THC, only one occasion  . Sexual activity: None    Comment:  wife had hysterectomy  Other Topics Concern  . None  Social History Narrative  . None   Additional Social History: He is from [place]. He has been homeless since June. He has 2 children (37 y/o daughter and 56 y/o son). He has a 48 y/o granddaughter. He has a girlfriend who was at bedside. He provided verbal permission for her to be present for the interview. He reports a history of severe alcohol use. He maintained sobriety for a year until drinking a pint of liquor prior to admission. He reports attending AA groups in the past. He denies illicit substance use or IVDU.     Allergies:   Allergies  Allergen Reactions  . Asa [Aspirin] Other (See Comments)    As a child, had nose bleed.     Labs:  Results for orders placed or performed during the hospital encounter of 10/20/17 (from the past 48 hour(s))  CBG monitoring, ED     Status: None   Collection Time: 10/20/17  6:07 PM  Result Value Ref Range   Glucose-Capillary 97 65 - 99 mg/dL   Comment 1 Notify RN    Comment 2 Document in Chart   CBC with Differential     Status: None   Collection Time: 10/20/17  6:30 PM  Result Value Ref Range   WBC 8.1 4.0 - 10.5 K/uL   RBC 4.90 4.22 - 5.81 MIL/uL   Hemoglobin 16.5 13.0 -  17.0 g/dL   HCT 47.1 39.0 - 52.0 %   MCV 96.1 78.0 - 100.0 fL   MCH 33.7 26.0 - 34.0 pg   MCHC 35.0 30.0 - 36.0 g/dL   RDW 15.4 11.5 - 15.5 %   Platelets 212 150 - 400 K/uL   Neutrophils Relative % 49 %   Neutro Abs 4.0 1.7 - 7.7 K/uL   Lymphocytes Relative 43 %   Lymphs Abs 3.5 0.7 - 4.0 K/uL   Monocytes Relative 5 %   Monocytes Absolute 0.4 0.1 - 1.0 K/uL   Eosinophils Relative 2 %   Eosinophils Absolute 0.1 0.0 - 0.7 K/uL   Basophils Relative 1 %   Basophils Absolute 0.1 0.0 - 0.1 K/uL  Ethanol  Status: Abnormal   Collection Time: 10/20/17  7:12 PM  Result Value Ref Range   Alcohol, Ethyl (B) 305 (HH) <10 mg/dL    Comment:        LOWEST DETECTABLE LIMIT FOR SERUM ALCOHOL IS 10 mg/dL FOR MEDICAL PURPOSES ONLY CRITICAL RESULT CALLED TO, READ BACK BY AND VERIFIED WITH: Cathe Mons [id number] @ 1956 BY J SCOTTON   Salicylate level     Status: None   Collection Time: 10/20/17  7:12 PM  Result Value Ref Range   Salicylate Lvl <9.4 2.8 - 30.0 mg/dL  Acetaminophen level     Status: Abnormal   Collection Time: 10/20/17  7:12 PM  Result Value Ref Range   Acetaminophen (Tylenol), Serum <10 (L) 10 - 30 ug/mL    Comment:        THERAPEUTIC CONCENTRATIONS VARY SIGNIFICANTLY. A RANGE OF 10-30 ug/mL MAY BE AN EFFECTIVE CONCENTRATION FOR MANY PATIENTS. HOWEVER, SOME ARE BEST TREATED AT CONCENTRATIONS OUTSIDE THIS RANGE. ACETAMINOPHEN CONCENTRATIONS >150 ug/mL AT 4 HOURS AFTER INGESTION AND >50 ug/mL AT 12 HOURS AFTER INGESTION ARE OFTEN ASSOCIATED WITH TOXIC REACTIONS.   Lithium level     Status: Abnormal   Collection Time: 10/20/17  7:12 PM  Result Value Ref Range   Lithium Lvl <0.06 (L) 0.60 - 1.20 mmol/L  Comprehensive metabolic panel     Status: Abnormal   Collection Time: 10/20/17  7:12 PM  Result Value Ref Range   Sodium 140 135 - 145 mmol/L   Potassium 3.8 3.5 - 5.1 mmol/L   Chloride 106 101 - 111 mmol/L   CO2 23 22 - 32 mmol/L   Glucose, Bld 93 65 - 99 mg/dL    BUN 10 6 - 20 mg/dL   Creatinine, Ser 0.91 0.61 - 1.24 mg/dL   Calcium 8.5 (L) 8.9 - 10.3 mg/dL   Total Protein 7.5 6.5 - 8.1 g/dL   Albumin 4.2 3.5 - 5.0 g/dL   AST 33 15 - 41 U/L   ALT 20 17 - 63 U/L   Alkaline Phosphatase 89 38 - 126 U/L   Total Bilirubin 0.6 0.3 - 1.2 mg/dL   GFR calc non Af Amer >60 >60 mL/min   GFR calc Af Amer >60 >60 mL/min    Comment: (NOTE) The eGFR has been calculated using the CKD EPI equation. This calculation has not been validated in all clinical situations. eGFR's persistently <60 mL/min signify possible Chronic Kidney Disease.    Anion gap 11 5 - 15  Urinalysis, Routine w reflex microscopic     Status: Abnormal   Collection Time: 10/20/17  9:38 PM  Result Value Ref Range   Color, Urine YELLOW YELLOW   APPearance CLEAR CLEAR   Specific Gravity, Urine 1.017 1.005 - 1.030   pH 5.0 5.0 - 8.0   Glucose, UA NEGATIVE NEGATIVE mg/dL   Hgb urine dipstick NEGATIVE NEGATIVE   Bilirubin Urine NEGATIVE NEGATIVE   Ketones, ur 5 (A) NEGATIVE mg/dL   Protein, ur NEGATIVE NEGATIVE mg/dL   Nitrite NEGATIVE NEGATIVE   Leukocytes, UA NEGATIVE NEGATIVE  Urine rapid drug screen (hosp performed)     Status: Abnormal   Collection Time: 10/20/17  9:38 PM  Result Value Ref Range   Opiates NONE DETECTED NONE DETECTED   Cocaine NONE DETECTED NONE DETECTED   Benzodiazepines POSITIVE (A) NONE DETECTED   Amphetamines NONE DETECTED NONE DETECTED   Tetrahydrocannabinol NONE DETECTED NONE DETECTED   Barbiturates NONE DETECTED NONE DETECTED    Comment:  DRUG SCREEN FOR MEDICAL PURPOSES ONLY.  IF CONFIRMATION IS NEEDED FOR ANY PURPOSE, NOTIFY LAB WITHIN 5 DAYS.        LOWEST DETECTABLE LIMITS FOR URINE DRUG SCREEN Drug Class       Cutoff (ng/mL) Amphetamine      1000 Barbiturate      200 Benzodiazepine   993 Tricyclics       716 Opiates          300 Cocaine          300 THC              50   HIV antibody (Routine Testing)     Status: None   Collection  Time: 10/20/17 10:05 PM  Result Value Ref Range   HIV Screen 4th Generation wRfx Non Reactive Non Reactive    Comment: (NOTE) Performed At: [facility] [place], [place] [id number] Rush Farmer MD FB:[phone]   CBG monitoring, ED     Status: None   Collection Time: 10/21/17 12:37 AM  Result Value Ref Range   Glucose-Capillary 78 65 - 99 mg/dL  Basic metabolic panel     Status: Abnormal   Collection Time: 10/21/17  5:39 AM  Result Value Ref Range   Sodium 138 135 - 145 mmol/L   Potassium 4.0 3.5 - 5.1 mmol/L   Chloride 105 101 - 111 mmol/L   CO2 22 22 - 32 mmol/L   Glucose, Bld 123 (H) 65 - 99 mg/dL   BUN 15 6 - 20 mg/dL   Creatinine, Ser 1.02 0.61 - 1.24 mg/dL   Calcium 8.4 (L) 8.9 - 10.3 mg/dL   GFR calc non Af Amer >60 >60 mL/min   GFR calc Af Amer >60 >60 mL/min    Comment: (NOTE) The eGFR has been calculated using the CKD EPI equation. This calculation has not been validated in all clinical situations. eGFR's persistently <60 mL/min signify possible Chronic Kidney Disease.    Anion gap 11 5 - 15  CBC WITH DIFFERENTIAL     Status: Abnormal   Collection Time: 10/21/17  5:39 AM  Result Value Ref Range   WBC 15.3 (H) 4.0 - 10.5 K/uL   RBC 4.40 4.22 - 5.81 MIL/uL   Hemoglobin 14.7 13.0 - 17.0 g/dL   HCT 42.9 39.0 - 52.0 %   MCV 97.5 78.0 - 100.0 fL   MCH 33.4 26.0 - 34.0 pg   MCHC 34.3 30.0 - 36.0 g/dL   RDW 15.2 11.5 - 15.5 %   Platelets 190 150 - 400 K/uL   Neutrophils Relative % 87 %   Neutro Abs 13.3 (H) 1.7 - 7.7 K/uL   Lymphocytes Relative 10 %   Lymphs Abs 1.5 0.7 - 4.0 K/uL   Monocytes Relative 3 %   Monocytes Absolute 0.4 0.1 - 1.0 K/uL   Eosinophils Relative 0 %   Eosinophils Absolute 0.0 0.0 - 0.7 K/uL   Basophils Relative 0 %   Basophils Absolute 0.0 0.0 - 0.1 K/uL  TSH     Status: Abnormal   Collection Time: 10/21/17  5:39 AM  Result Value Ref Range   TSH 0.288 (L) 0.350 - 4.500 uIU/mL    Comment: Performed by a 3rd  Generation assay with a functional sensitivity of <=0.01 uIU/mL.  Hepatic function panel     Status: None   Collection Time: 10/21/17  5:39 AM  Result Value Ref Range   Total Protein 6.8 6.5 - 8.1 g/dL  Albumin 3.8 3.5 - 5.0 g/dL   AST 38 15 - 41 U/L   ALT 22 17 - 63 U/L   Alkaline Phosphatase 81 38 - 126 U/L   Total Bilirubin 0.7 0.3 - 1.2 mg/dL   Bilirubin, Direct 0.2 0.1 - 0.5 mg/dL   Indirect Bilirubin 0.5 0.3 - 0.9 mg/dL  CBG monitoring, ED     Status: Abnormal   Collection Time: 10/21/17  5:52 AM  Result Value Ref Range   Glucose-Capillary 115 (H) 65 - 99 mg/dL  Glucose, capillary     Status: None   Collection Time: 10/21/17 12:36 PM  Result Value Ref Range   Glucose-Capillary 76 65 - 99 mg/dL  Glucose, capillary     Status: Abnormal   Collection Time: 10/21/17  6:40 PM  Result Value Ref Range   Glucose-Capillary 114 (H) 65 - 99 mg/dL  Glucose, capillary     Status: None   Collection Time: 10/22/17  6:43 AM  Result Value Ref Range   Glucose-Capillary 90 65 - 99 mg/dL    Current Facility-Administered Medications  Medication Dose Route Frequency Provider Last Rate Last Dose  . acetaminophen (TYLENOL) tablet 650 mg  650 mg Oral Q6H PRN Vertis Kelch, NP   650 mg at 10/21/17 2139  . clopidogrel (PLAVIX) tablet 75 mg  75 mg Oral Daily Greta Doom, MD      . divalproex (DEPAKOTE) DR tablet 500 mg  500 mg Oral Q12H Greta Doom, MD   500 mg at 10/21/17 2136  . folic acid (FOLVITE) tablet 1 mg  1 mg Oral Daily Rise Patience, MD   1 mg at 10/21/17 1522  . LORazepam (ATIVAN) tablet 1 mg  1 mg Oral Q6H PRN Rise Patience, MD   1 mg at 10/21/17 1312   Or  . LORazepam (ATIVAN) injection 1 mg  1 mg Intravenous Q6H PRN Rise Patience, MD      . multivitamin with minerals tablet 1 tablet  1 tablet Oral Daily Rise Patience, MD   1 tablet at 10/21/17 1522  . nicotine (NICODERM CQ - dosed in mg/24 hours) patch 21 mg  21 mg  Transdermal Daily Oretha Milch D, MD   21 mg at 10/21/17 1313  . ondansetron (ZOFRAN) tablet 4 mg  4 mg Oral Q6H PRN Rise Patience, MD   4 mg at 10/21/17 1313   Or  . ondansetron (ZOFRAN) injection 4 mg  4 mg Intravenous Q6H PRN Rise Patience, MD      . thiamine (VITAMIN B-1) tablet 100 mg  100 mg Oral Daily Rise Patience, MD   100 mg at 10/21/17 1522   Or  . thiamine (B-1) injection 100 mg  100 mg Intravenous Daily Rise Patience, MD   100 mg at 10/20/17 2232    Musculoskeletal: Strength & Muscle Tone: within normal limits Gait & Station: normal Patient leans: Backward  Psychiatric Specialty Exam: Physical Exam  Nursing note and vitals reviewed. Constitutional: He is oriented to person, place, and time. He appears well-developed and well-nourished.  HENT:  Head: Normocephalic and atraumatic.  Neck: Normal range of motion.  Respiratory: Effort normal.  Musculoskeletal: Normal range of motion.  Neurological: He is alert and oriented to person, place, and time.  Skin: No rash noted.  Psychiatric: His behavior is normal. Judgment and thought content normal.    Review of Systems  Constitutional: Negative for chills and fever.  Eyes: Negative for  blurred vision.  Gastrointestinal: Negative for constipation, diarrhea, nausea and vomiting.  Psychiatric/Behavioral: Positive for depression and substance abuse. Negative for hallucinations and suicidal ideas. The patient has insomnia.     Blood pressure 127/79, pulse 88, temperature 99.5 F (37.5 C), temperature source Oral, resp. rate 20, height 5' 9"  (1.753 m), weight 87.1 kg (192 lb 1.6 oz), SpO2 97 %.Body mass index is 28.37 kg/m.  General Appearance: Well Groomed, middle aged, Caucasian male with a hospital gown and lying in bed. NAD.   Eye Contact:  Good  Speech:  Normal Rate  Volume:  Increased  Mood:  Depressed  Affect:  Dysphoric  Thought Process:  Goal Directed and Linear  Orientation:  Full  (Time, Place, and Person)  Thought Content:  Logical  Suicidal Thoughts:  No  Homicidal Thoughts:  No  Memory:  Immediate;   Good Recent;   Good Remote;   Good  Judgement:  Fair  Insight:  Fair  Psychomotor Activity:  Normal  Concentration:  Concentration: Good and Attention Span: Good  Recall:  Good  Fund of Knowledge:  Good  Language:  Good  Akathisia:  No  Handed:  Right  AIMS (if indicated):   N/A  Assets:  Communication Skills Desire for Improvement Social Support  ADL's:  Intact  Cognition:  WNL  Sleep:   Poor    Assessment:  MIQUEAS WHILDEN is 51 y.o. male who was admitted for suicide attempt by Seroquel and Atarax overdose. He reports a decline in his mood over the past 2 months with neurovegetative symptoms. He overdosed on Seroquel and Atarax while intoxicated (BAL 305) just prior to admission. He reports that his recent medication regimen has been helpful for his mood so these medications should be restarted. He warrants inpatient psychiatric hospitalization for further stabilization and treatment.   Treatment Plan Summary: -Patient warrants inpatient psychiatric hospitalization given high risk of harm to self. -Continue bedside sitter.  -Continue home medications: Buspar 10 mg QID, Atarax 50 mg QID, Lithium 600 mg qhs and Trazodone 100 mg qhs. Restart home dose of Seroquel tomorrow if patient tolerates restarting other medications. Restart Seroquel 300 mg qhs and 25 mg BID,  -Please pursue involuntary commitment if patient refuses voluntary psychiatric hospitalization or attempts to leave the hospital.  -Will sign off on patient at this time. Please consult psychiatry again as needed.     Disposition: Recommend psychiatric Inpatient admission when medically cleared.  Faythe Dingwall, DO 10/22/2017 10:33 AM

## 2017-10-22 NOTE — Progress Notes (Signed)
PROGRESS NOTE  Nathaniel SpatesJames T Warren YQM:[id number] DOB: 12/12/1965 DOA: 10/20/2017 PCP: System, Pcp Not In  HPI/Recap of past 24 hours:  Nathaniel SpatesJames T Deam is a 51 y.o. year old male with medical history significant for bipolar disorder, seizure disorder and alcoholism  who presented on 10/20/2017 with concerns for suicide attempt with overdose of Seroquel and hydroxyzine. Of note, patient was recently admitted to inpatient psychiatric facility on 10/18 for suicide attempt.  Patient had witnessed seizure outside of the hospital and again while in the ED requiring Ativan 2 mg IV. Neurology was consulted and placed patient on Depakote. UDS was positive for benzodiazepines.  EKG showed no prolongation in QtC.  Brain MRI on 11/14 showed no acute intracranial abnormality   In the past 24 hours,has been without any witnessed seizures. No issues overnight   This morning continues to report depressed mood. Assessment/Plan: Principal Problem:   Drug overdose, intentional, initial encounter (HCC) Active Problems:   Depression   MDD (major depressive disorder), recurrent episode, severe (HCC)   Alcohol dependence (HCC)   Drug overdose, intentional self-harm, initial encounter (HCC)   Seizures (HCC)   Drug overdose   Multiple lacunar infarcts    Intentional Overdose ( Seroquel and hydroxyzine). 2nd suicide attempt in less than a month. Patient will likely need inpatient psychiatric stay.  -Consulted psych since medically stable ( seizure free, MRI brain already ruled out other organic causes).  -EKG shows no prolonged Qtc  Witnessed Seizure. Stable. Most likely related to Seroquel overdose. MRI brain shows no organic cause. Patient does have known seizure history and other concerning risk factors including alcohol abuse. - neurology recommends depakote given bipolar history -seizure precautions  Bipolar Disorder, depression with suicidal ideation. Likely not best controlled given recurrent suicide  attempts. Patient currently homeless and not adherent to medication regimen per girlfriend - depakote for now - Consulted psych   Ethanol abuse. CIWA protocol  Remote Lacunar infarcts ( on MRI Brain).  -Given aspirin allergy ( nosebleeds as child) neurology advises plavix.   Code Status: Full Code   Family Communication: Girlfriend at bedside and updated   Disposition Plan: Psychiatric consult on 11/15 given SI, bedside sitter currently     Consultants:  Neurology   Procedures:  None   Antimicrobials:  None   Cultures:  None  DVT prophylaxis: No SCDs given SI risk.    Objective: Vitals:   10/21/17 0645 10/21/17 0841 10/21/17 1225 10/21/17 1840  BP: (!) 111/58 131/70 114/67 127/79  Pulse: 85 91 86 88  Resp: 18 18 18 20   Temp:  98.5 F (36.9 C) 98.7 F (37.1 C) 99.5 F (37.5 C)  TempSrc:  Oral Oral Oral  SpO2: 96% 93% 95% 97%  Weight:  87.1 kg (192 lb 1.6 oz)    Height:  5\' 9"  (1.753 m)      Intake/Output Summary (Last 24 hours) at 10/22/2017 1038 Last data filed at 10/22/2017 0908 Gross per 24 hour  Intake 2441.67 ml  Output -  Net 2441.67 ml   Filed Weights   10/20/17 1835 10/21/17 0841  Weight: 86.2 kg (190 lb) 87.1 kg (192 lb 1.6 oz)    Exam:   General:  Lying in bed   Skin: clean,dry, intact  Neurologic: moving all extremities without focal deficits  Psychiatry: flat affect, depressed    Data Reviewed: CBC: Recent Labs  Lab 10/20/17 1830 10/21/17 0539  WBC 8.1 15.3*  NEUTROABS 4.0 13.3*  HGB 16.5 14.7  HCT 47.1 42.9  MCV 96.1 97.5  PLT 212 190   Basic Metabolic Panel: Recent Labs  Lab 10/20/17 1912 10/21/17 0539  NA 140 138  K 3.8 4.0  CL 106 105  CO2 23 22  GLUCOSE 93 123*  BUN 10 15  CREATININE 0.91 1.02  CALCIUM 8.5* 8.4*   GFR: Estimated Creatinine Clearance: 95.8 mL/min (by C-G formula based on SCr of 1.02 mg/dL). Liver Function Tests: Recent Labs  Lab 10/20/17 1912 10/21/17 0539  AST 33 38  ALT 20  22  ALKPHOS 89 81  BILITOT 0.6 0.7  PROT 7.5 6.8  ALBUMIN 4.2 3.8   No results for input(s): LIPASE, AMYLASE in the last 168 hours. No results for input(s): AMMONIA in the last 168 hours. Coagulation Profile: No results for input(s): INR, PROTIME in the last 168 hours. Cardiac Enzymes: No results for input(s): CKTOTAL, CKMB, CKMBINDEX, TROPONINI in the last 168 hours. BNP (last 3 results) No results for input(s): PROBNP in the last 8760 hours. HbA1C: No results for input(s): HGBA1C in the last 72 hours. CBG: Recent Labs  Lab 10/21/17 0037 10/21/17 0552 10/21/17 1236 10/21/17 1840 10/22/17 0643  GLUCAP 78 115* 76 114* 90   Lipid Profile: No results for input(s): CHOL, HDL, LDLCALC, TRIG, CHOLHDL, LDLDIRECT in the last 72 hours. Thyroid Function Tests: Recent Labs    10/21/17 0539  TSH 0.288*   Anemia Panel: No results for input(s): VITAMINB12, FOLATE, FERRITIN, TIBC, IRON, RETICCTPCT in the last 72 hours. Urine analysis:    Component Value Date/Time   COLORURINE YELLOW 10/20/2017 2138   APPEARANCEUR CLEAR 10/20/2017 2138   LABSPEC 1.017 10/20/2017 2138   PHURINE 5.0 10/20/2017 2138   GLUCOSEU NEGATIVE 10/20/2017 2138   HGBUR NEGATIVE 10/20/2017 2138   BILIRUBINUR NEGATIVE 10/20/2017 2138   KETONESUR 5 (A) 10/20/2017 2138   PROTEINUR NEGATIVE 10/20/2017 2138   UROBILINOGEN 0.2 06/07/2013 1905   NITRITE NEGATIVE 10/20/2017 2138   LEUKOCYTESUR NEGATIVE 10/20/2017 2138   Sepsis Labs: @LABRCNTIP (procalcitonin:4,lacticidven:4)  )No results found for this or any previous visit (from the past 240 hour(s)).    Studies: Mr Laqueta Jean ZO Contrast  Result Date: 10/21/2017 CLINICAL DATA:  Seizure comminuted, drug related, nontraumatic. Overdose was Seroquel. Two witnessed seizures. EXAM: MRI HEAD WITHOUT AND WITH CONTRAST TECHNIQUE: Multiplanar, multiecho pulse sequences of the brain and surrounding structures were obtained without and with intravenous contrast. CONTRAST:   18mL MULTIHANCE GADOBENATE DIMEGLUMINE 529 MG/ML IV SOLN COMPARISON:  MRI brain 12/14/2016.  CT head 10/20/2017. FINDINGS: Brain: No acute infarct, hemorrhage, or mass lesion is present. A remote lacunar infarct in the left caudate is stable. Remote lacunar infarcts of the cerebellum are stable. No new infarcts are present. The ventricles are of normal size. Dedicated imaging of the temporal lobes demonstrates symmetric size and signal of the hippocampal structures bilaterally. Postcontrast images demonstrate no pathologic enhancement. Vascular: Flow is present in the major intracranial arteries. A benign developmental venous anomaly is present in the medial right parietal lobe near the splenium of the corpus callosum. Skull and upper cervical spine: The skullbase is within normal limits. The craniocervical junction is normal. Marrow signal is normal. Sinuses/Orbits: Chronic left maxillary and ethmoid mucosal thickening is present. The remaining paranasal sinuses are clear. The mastoid air cells are clear bilaterally. No obstructing nasopharyngeal lesion is present. The globes and orbits are within normal limits. IMPRESSION: 1. No acute or focal lesion to explain seizures. 2. Remote lacunar infarcts are stable. 3. Left maxillary and ethmoid mucosal disease as described.  Electronically Signed   By: Marin Robertshristopher  Mattern M.D.   On: 10/21/2017 14:39    Scheduled Meds: . clopidogrel  75 mg Oral Daily  . divalproex  500 mg Oral Q12H  . folic acid  1 mg Oral Daily  . multivitamin with minerals  1 tablet Oral Daily  . nicotine  21 mg Transdermal Daily  . thiamine  100 mg Oral Daily   Or  . thiamine  100 mg Intravenous Daily    Continuous Infusions:    LOS: 2 days     Laverna PeaceShayla D Stacy Sailer, MD [facility] Pager [phone]  If 7PM-7AM, please contact night-coverage [email or web address] Password [email or web address] 10/22/2017, 10:38 AM

## 2017-10-23 DIAGNOSIS — F332 Major depressive disorder, recurrent severe without psychotic features: Secondary | ICD-10-CM

## 2017-10-23 LAB — CBC
HCT: 46.3 % (ref 39.0–52.0)
Hemoglobin: 15.9 g/dL (ref 13.0–17.0)
MCH: 32.9 pg (ref 26.0–34.0)
MCHC: 34.3 g/dL (ref 30.0–36.0)
MCV: 95.9 fL (ref 78.0–100.0)
PLATELETS: 161 10*3/uL (ref 150–400)
RBC: 4.83 MIL/uL (ref 4.22–5.81)
RDW: 14.7 % (ref 11.5–15.5)
WBC: 8 10*3/uL (ref 4.0–10.5)

## 2017-10-23 LAB — BASIC METABOLIC PANEL
Anion gap: 7 (ref 5–15)
BUN: 7 mg/dL (ref 6–20)
CHLORIDE: 104 mmol/L (ref 101–111)
CO2: 25 mmol/L (ref 22–32)
CREATININE: 0.88 mg/dL (ref 0.61–1.24)
Calcium: 9 mg/dL (ref 8.9–10.3)
GFR calc Af Amer: >60 mL/min (ref >60)
GFR calc non Af Amer: >60 mL/min (ref >60)
GLUCOSE: 97 mg/dL (ref 65–99)
Potassium: 3.9 mmol/L (ref 3.5–5.1)
SODIUM: 136 mmol/L (ref 135–145)

## 2017-10-23 MED ORDER — QUETIAPINE FUMARATE 300 MG PO TABS
300.0000 mg | ORAL_TABLET | Freq: Every day | ORAL | Status: DC
  Administered 2017-10-26: 300 mg via ORAL
  Filled 2017-10-23 (×4): qty 1

## 2017-10-23 MED ORDER — QUETIAPINE FUMARATE 25 MG PO TABS
25.0000 mg | ORAL_TABLET | Freq: Two times a day (BID) | ORAL | Status: DC
  Administered 2017-10-23 – 2017-10-26 (×8): 25 mg via ORAL
  Filled 2017-10-23 (×9): qty 1

## 2017-10-23 NOTE — Progress Notes (Signed)
PROGRESS NOTE  Nathaniel Warren:[id number]:[id number] DOB: 07/18/1966 DOA: 10/20/2017 PCP: System, Pcp Not In  HPI/Recap of past 24 hours:  Nathaniel Warren is a 51 y.o. year old male with medical history significant for bipolar disorder, seizure disorder and alcoholism  who presented on 10/20/2017 with concerns for suicide attempt with overdose of Seroquel and hydroxyzine. Of note, patient was recently admitted to inpatient psychiatric facility on 10/18 for suicide attempt.  Patient had witnessed seizure outside of the hospital and again while in the ED requiring Ativan 2 mg IV. Neurology was consulted and placed patient on Depakote. UDS was positive for benzodiazepines.  EKG showed no prolongation in QtC.  Brain MRI on 11/14 showed no acute intracranial abnormality   Patient continues to be without any weakness even his seizures.  No issues overnight.   Assessment/Plan: Principal Problem:   MDD (major depressive disorder), recurrent severe, without psychosis (HCC) Active Problems:   Depression   Alcohol dependence (HCC)   Drug overdose, intentional, initial encounter [facility](HCC)   Drug overdose, intentional self-harm, initial encounter (HCC)   Seizures (HCC)   Drug overdose   Multiple lacunar infarcts Is Intentional Overdose ( Seroquel and hydroxyzine). 2nd suicide attempt in less than a month.  -Psych agrees for inpatient psychiatric unit admission -EKG shows no prolonged Qtc  Witnessed Seizure. Stable. Most likely related to Seroquel overdose. MRI brain shows no organic cause. Patient does have known seizure history and other concerning risk factors including alcohol abuse. - neurology recommends depakote given bipolar history -seizure precautions  Bipolar Disorder, depression with suicidal ideation. Likely not best controlled given recurrent suicide attempts. Patient tolerated resuming his home meds overnivght - depakote for now - Consulted psych, following recommendations: continue home  buspar, atarax, lithium, and trazodone - per psych recs will restart Seroquel 300 mg qhs and 25 mg BID  Ethanol abuse. CIWA protocol  Remote Lacunar infarcts ( on MRI Brain).  -Given aspirin allergy ( nosebleeds as child) neurology advises plavix.   Code Status: Full Code   Family Communication: No family at bedside  Disposition Plan: awaiting psych inpt admission, bedside sitter currently     [facility]  Procedures:  None   Antimicrobials:  None   Cultures:  None  DVT prophylaxis: No SCDs given SI risk.    Objective: Vitals:   10/22/17 2335 10/23/17 0625 10/23/17 0834 10/23/17 1144  BP: 135/81 116/81 104/82 114/74  Pulse: 77 63 68 81  Resp: 18 18 18 16   Temp: 98.3 F (36.8 C) 97.6 F (36.4 C) 97.9 F (36.6 C) 98.7 F (37.1 C)  TempSrc: Oral Oral Oral Oral  SpO2: 98% 95% 96% 96%  Weight:      Height:        Intake/Output Summary (Last 24 hours) at 10/23/2017 1350 Last data filed at 10/23/2017 1350 Gross per 24 hour  Intake 720 ml  Output -  Net 720 ml   Filed Weights   10/20/17 1835 10/21/17 0841  Weight: 86.2 kg (190 lb) 87.1 kg (192 lb 1.6 oz)    Exam:   General:  Lying in bed   Skin: clean,dry, intact  Neurologic: moving all extremities without focal deficits   Psychiatry: flat affect, depressed    Data Reviewed: CBC: Recent Labs  Lab 10/20/17 1830 10/21/17 0539 10/23/17 0848  WBC 8.1 15.3* 8.0  NEUTROABS 4.0 13.3*  --   HGB 16.5 14.7 15.9  HCT 47.1 42.9 46.3  MCV 96.1 97.5 95.9  PLT  212 190 161   Basic Metabolic Panel: Recent Labs  Lab 10/20/17 1912 10/21/17 0539 10/23/17 0848  NA 140 138 136  K 3.8 4.0 3.9  CL 106 105 104  CO2 23 22 25   GLUCOSE 93 123* 97  BUN 10 15 7   CREATININE 0.91 1.02 0.88  CALCIUM 8.5* 8.4* 9.0   GFR: Estimated Creatinine Clearance: 111 mL/min (by C-G formula based on SCr of 0.88 mg/dL). Liver Function Tests: Recent Labs  Lab 10/20/17 1912 10/21/17 0539  AST  33 38  ALT 20 22  ALKPHOS 89 81  BILITOT 0.6 0.7  PROT 7.5 6.8  ALBUMIN 4.2 3.8   No results for input(s): LIPASE, AMYLASE in the last 168 hours. No results for input(s): AMMONIA in the last 168 hours. Coagulation Profile: No results for input(s): INR, PROTIME in the last 168 hours. Cardiac Enzymes: No results for input(s): CKTOTAL, CKMB, CKMBINDEX, TROPONINI in the last 168 hours. BNP (last 3 results) No results for input(s): PROBNP in the last 8760 hours. HbA1C: No results for input(s): HGBA1C in the last 72 hours. CBG: Recent Labs  Lab 10/21/17 1236 10/21/17 1840 10/22/17 0643 10/22/17 1200 10/22/17 1844  GLUCAP 76 114* 90 111* 139*   Lipid Profile: No results for input(s): CHOL, HDL, LDLCALC, TRIG, CHOLHDL, LDLDIRECT in the last 72 hours. Thyroid Function Tests: Recent Labs    10/21/17 0539  TSH 0.288*   Anemia Panel: No results for input(s): VITAMINB12, FOLATE, FERRITIN, TIBC, IRON, RETICCTPCT in the last 72 hours. Urine analysis:    Component Value Date/Time   COLORURINE YELLOW 10/20/2017 2138   APPEARANCEUR CLEAR 10/20/2017 2138   LABSPEC 1.017 10/20/2017 2138   PHURINE 5.0 10/20/2017 2138   GLUCOSEU NEGATIVE 10/20/2017 2138   HGBUR NEGATIVE 10/20/2017 2138   BILIRUBINUR NEGATIVE 10/20/2017 2138   KETONESUR 5 (A) 10/20/2017 2138   PROTEINUR NEGATIVE 10/20/2017 2138   UROBILINOGEN 0.2 06/07/2013 1905   NITRITE NEGATIVE 10/20/2017 2138   LEUKOCYTESUR NEGATIVE 10/20/2017 2138   Sepsis Labs: @LABRCNTIP (procalcitonin:4,lacticidven:4)  )No results found for this or any previous visit (from the past 240 hour(s)).    Studies: No results found.  Scheduled Meds: . busPIRone  10 mg Oral TID  . clopidogrel  75 mg Oral Daily  . divalproex  500 mg Oral Q12H  . folic acid  1 mg Oral Daily  . lithium carbonate  600 mg Oral QHS  . multivitamin with minerals  1 tablet Oral Daily  . nicotine  21 mg Transdermal Daily  . thiamine  100 mg Oral Daily     Continuous Infusions:    LOS: 3 days     Laverna PeaceShayla D Demaya Hardge, MD [facility] Pager [phone]  If 7PM-7AM, please contact night-coverage [email or web address] Password [email or web address] 10/23/2017, 1:50 PM

## 2017-10-23 NOTE — Progress Notes (Signed)
Pt refused evening dose of 300mg  Seroquel.  States "when I take that much seroquel and lithium together it makes my head hurt and blurs my vision."  Scheduled 25mg  dose of Seroquel was administered.

## 2017-10-24 MED ORDER — INFLUENZA VAC SPLIT QUAD 0.5 ML IM SUSY
0.5000 mL | PREFILLED_SYRINGE | INTRAMUSCULAR | Status: AC
  Administered 2017-10-25: 0.5 mL via INTRAMUSCULAR
  Filled 2017-10-24: qty 0.5

## 2017-10-24 MED ORDER — PNEUMOCOCCAL VAC POLYVALENT 25 MCG/0.5ML IJ INJ
0.5000 mL | INJECTION | INTRAMUSCULAR | Status: AC
  Administered 2017-10-25: 0.5 mL via INTRAMUSCULAR
  Filled 2017-10-24 (×2): qty 0.5

## 2017-10-24 NOTE — Progress Notes (Signed)
PROGRESS NOTE  Nathaniel SpatesJames T Warren ZOX:[id number] DOB: 07/25/1966 DOA: 10/20/2017 PCP: System, Pcp Not In  HPI/Recap of past 24 hours:  Nathaniel SpatesJames T Warren is a 51 y.o. year old male with medical history significant for bipolar disorder, seizure disorder and alcoholism  who presented on 10/20/2017 with concerns for suicide attempt with overdose of Seroquel and hydroxyzine. Of note, patient was recently admitted to inpatient psychiatric facility on 10/18 for suicide attempt.  Patient had witnessed seizure outside of the hospital and again while in the ED requiring Ativan 2 mg IV. Neurology was consulted and placed patient on Depakote. UDS was positive for benzodiazepines.  EKG showed no prolongation in QtC.  Brain MRI on 11/14 showed no acute intracranial abnormality   No issues overnight. No complaints.    Assessment/Plan: Principal Problem:   MDD (major depressive disorder), recurrent severe, without psychosis (HCC) Active Problems:   Depression   Alcohol dependence (HCC)   Drug overdose, intentional, initial encounter [facility](HCC)   Drug overdose, intentional self-harm, initial encounter (HCC)   Seizures (HCC)   Drug overdose   Multiple lacunar infarcts  Intentional Overdose ( Seroquel and hydroxyzine). 2nd suicide attempt in less than a month.  -Psych agrees for inpatient psychiatric unit admission -EKG shows no prolonged Qtc  Witnessed Seizure. Stable. Most likely related to Seroquel overdose. MRI brain shows no organic cause. Patient does have known seizure history and other concerning risk factors including alcohol abuse. Greater than 48 hours without witnessed seizures - neurology recommends depakote given bipolar history -seizure precautions  Bipolar Disorder, depression with suicidal ideation. Likely not best controlled given recurrent suicide attempts. Patient tolerated resuming his home meds overnivght - depakote for now - Consulted psych, following recommendations: continue home  buspar, atarax, lithium, and trazodone - per psych recs Seroquel 300 mg qhs and 25 mg BID  Ethanol abuse. CIWA protocol  Remote Lacunar infarcts ( on MRI Brain).  -Given aspirin allergy ( nosebleeds as child) neurology advises plavix.   Code Status: Full Code   Family Communication: No family at bedside  Disposition Plan: awaiting psych inpt admission, bedside sitter currently     [facility]  [facility]  Procedures:  None   Antimicrobials:  None   Cultures:  None  DVT prophylaxis: No SCDs given SI risk.    Objective: Vitals:   10/23/17 1144 10/24/17 0009 10/24/17 0728 10/24/17 1156  BP: 114/74 111/76 118/72 102/71  Pulse: 81 67 64 74  Resp: 16 18 18 18   Temp: 98.7 F (37.1 C) 97.8 F (36.6 C) 98.2 F (36.8 C) 98.3 F (36.8 C)  TempSrc: Oral Oral Oral Oral  SpO2: 96% 97% 100% 95%  Weight:      Height:        Intake/Output Summary (Last 24 hours) at 10/24/2017 1346 Last data filed at 10/24/2017 1100 Gross per 24 hour  Intake 1320 ml  Output -  Net 1320 ml   Filed Weights   10/20/17 1835 10/21/17 0841  Weight: 86.2 kg (190 lb) 87.1 kg (192 lb 1.6 oz)    Exam:   General:  Lying in bed   Skin: clean,dry, intact  Neurologic: moving all extremities without focal deficits   Psychiatry: flat affect, improved mood   Data Reviewed: CBC: Recent Labs  Lab 10/20/17 1830 10/21/17 0539 10/23/17 0848  WBC 8.1 15.3* 8.0  NEUTROABS 4.0 13.3*  --   HGB 16.5 14.7 15.9  HCT 47.1 42.9 46.3  MCV 96.1 97.5 95.9  PLT 212 190 161  Basic Metabolic Panel: Recent Labs  Lab 10/20/17 1912 10/21/17 0539 10/23/17 0848  NA 140 138 136  K 3.8 4.0 3.9  CL 106 105 104  CO2 23 22 25   GLUCOSE 93 123* 97  BUN 10 15 7   CREATININE 0.91 1.02 0.88  CALCIUM 8.5* 8.4* 9.0   GFR: Estimated Creatinine Clearance: 111 mL/min (by C-G formula based on SCr of 0.88 mg/dL). Liver Function Tests: Recent Labs  Lab 10/20/17 1912 10/21/17 0539  AST 33 38    ALT 20 22  ALKPHOS 89 81  BILITOT 0.6 0.7  PROT 7.5 6.8  ALBUMIN 4.2 3.8   No results for input(s): LIPASE, AMYLASE in the last 168 hours. No results for input(s): AMMONIA in the last 168 hours. Coagulation Profile: No results for input(s): INR, PROTIME in the last 168 hours. Cardiac Enzymes: No results for input(s): CKTOTAL, CKMB, CKMBINDEX, TROPONINI in the last 168 hours. BNP (last 3 results) No results for input(s): PROBNP in the last 8760 hours. HbA1C: No results for input(s): HGBA1C in the last 72 hours. CBG: Recent Labs  Lab 10/21/17 1236 10/21/17 1840 10/22/17 0643 10/22/17 1200 10/22/17 1844  GLUCAP 76 114* 90 111* 139*   Lipid Profile: No results for input(s): CHOL, HDL, LDLCALC, TRIG, CHOLHDL, LDLDIRECT in the last 72 hours. Thyroid Function Tests: No results for input(s): TSH, T4TOTAL, FREET4, T3FREE, THYROIDAB in the last 72 hours. Anemia Panel: No results for input(s): VITAMINB12, FOLATE, FERRITIN, TIBC, IRON, RETICCTPCT in the last 72 hours. Urine analysis:    Component Value Date/Time   COLORURINE YELLOW 10/20/2017 2138   APPEARANCEUR CLEAR 10/20/2017 2138   LABSPEC 1.017 10/20/2017 2138   PHURINE 5.0 10/20/2017 2138   GLUCOSEU NEGATIVE 10/20/2017 2138   HGBUR NEGATIVE 10/20/2017 2138   BILIRUBINUR NEGATIVE 10/20/2017 2138   KETONESUR 5 (A) 10/20/2017 2138   PROTEINUR NEGATIVE 10/20/2017 2138   UROBILINOGEN 0.2 06/07/2013 1905   NITRITE NEGATIVE 10/20/2017 2138   LEUKOCYTESUR NEGATIVE 10/20/2017 2138   Sepsis Labs: @LABRCNTIP (procalcitonin:4,lacticidven:4)  )No results found for this or any previous visit (from the past 240 hour(s)).    Studies: No results found.  Scheduled Meds: . busPIRone  10 mg Oral TID  . clopidogrel  75 mg Oral Daily  . divalproex  500 mg Oral Q12H  . folic acid  1 mg Oral Daily  . [START ON 10/25/2017] Influenza vac split quadrivalent PF  0.5 mL Intramuscular Tomorrow-1000  . lithium carbonate  600 mg Oral QHS   . multivitamin with minerals  1 tablet Oral Daily  . nicotine  21 mg Transdermal Daily  . [START ON 10/25/2017] pneumococcal 23 valent vaccine  0.5 mL Intramuscular Tomorrow-1000  . QUEtiapine  25 mg Oral BID  . QUEtiapine  300 mg Oral QHS  . thiamine  100 mg Oral Daily    Continuous Infusions:    LOS: 4 days     Laverna PeaceShayla D Humna Moorehouse, MD [facility] Pager [phone]  If 7PM-7AM, please contact night-coverage [email or web address] Password [email or web address] 10/24/2017, 1:46 PM

## 2017-10-24 NOTE — Plan of Care (Signed)
  Progressing Clinical Measurements: Ability to maintain clinical measurements within normal limits will improve 10/24/2017 0932 - Progressing by Angela Nevinharleston, Aleeya Veitch, RN Nutrition: Adequate nutrition will be maintained 10/24/2017 0932 - Progressing by Angela Nevinharleston, Soren Lazarz, RN Coping: Level of anxiety will decrease 10/24/2017 0932 - Progressing by Angela Nevinharleston, Meldrick Buttery, RN

## 2017-10-25 NOTE — Progress Notes (Addendum)
PROGRESS NOTE  Nathaniel SpatesJames T Warren ZOX:[id number] DOB: 09/28/1966 DOA: 10/20/2017 PCP: System, Pcp Not In  HPI/Recap of past 24 hours:  Nathaniel SpatesJames T Warren is a 51 y.o. year old male with medical history significant for bipolar disorder, seizure disorder and alcoholism  who presented on 10/20/2017 with concerns for suicide attempt with overdose of Seroquel and hydroxyzine. Of note, patient was recently admitted to inpatient psychiatric facility on 10/18 for suicide attempt.  Patient had witnessed seizure outside of the hospital and again while in the ED requiring Ativan 2 mg IV. Neurology was consulted and placed patient on Depakote. UDS was positive for benzodiazepines.  EKG showed no prolongation in QtC.  Brain MRI on 11/14 showed no acute intracranial abnormality   Patient reports episode of sweating associated with anxiety overnight.  Denies any recurrent episodes.  No chest pain, no shortness of breath, no cough.   Assessment/Plan: Principal Problem:   MDD (major depressive disorder), recurrent severe, without psychosis (HCC) Active Problems:   Depression   Alcohol dependence (HCC)   Drug overdose, intentional, initial encounter [facility](HCC)   Drug overdose, intentional self-harm, initial encounter (HCC)   Seizures (HCC)   Drug overdose   Multiple lacunar infarcts  Intentional Overdose ( Seroquel and hydroxyzine).  Awaiting inpatient psychiatric unit admission as  psychiatry has evaluated patient.  Seizure. Stable.  No further episodes since admission.  Likely prompted in the setting of Seroquel overdose.  During workup MRI Brain showed no organic cause. Continue Depakote as recommended by neurology, seizure precautions   Bipolar Disorder, depression with suicidal ideation. Likely not best controlled given recurrent suicide attempts.Marland Kitchen.  [facility] and [facility] dose; however, patient is declining Seroquel overnight stating severe headaches  Consulted psych, following  recommendations: continue home buspar, atarax, lithium, and trazodone, Seroquel 300 mg qhs and 25 mg BID  Ethanol abuse.  Has been without withdrawal symptoms during hospital course  Continue to monitor on CIWA protocol  Remote Lacunar infarcts.  Found incidentally on MRI brain during workup for seizures.  Patient has remained asymptomatic during hospital course no concern for new neurologic deficits. -Given aspirin allergy ( nosebleeds as child) neurology advises plavix.   Code Status: Full Code   Family Communication: No family at bedside  Disposition Plan: awaiting bed availability for inpatient psych unit, bedside sitter currently     Cons[facility]ants[facility]  [name]  Procedures:  None   Antimicrobials:  None   Cultures:  None  DVT prophylaxis: No SCDs given SI risk.    Objective: Vitals:   10/24/17 1156 10/24/17 1835 10/24/17 2230 10/25/17 0640  BP: 102/71 106/77 110/70 93/61  Pulse: 74 83 80 64  Resp: 18 18 18 18   Temp: 98.3 F (36.8 C) 98.2 F (36.8 C) 98.2 F (36.8 C) 98 F (36.7 C)  TempSrc: Oral Oral  Oral  SpO2: 95% 96% 97% 98%  Weight:      Height:        Intake/Output Summary (Last 24 hours) at 10/25/2017 1321 Last data filed at 10/25/2017 [id number] Gross per 24 hour  Intake 1080 ml  Output -  Net 1080 ml   Filed Weights   10/20/17 1835 10/21/17 0841  Weight: 86.2 kg (190 lb) 87.1 kg (192 lb 1.6 oz)    Exam:   General:  Lying in bed   Skin: clean,dry, intact  Neurologic: moving all extremities without focal deficits   Psychiatry: flat affect, improved mood   Data Reviewed: CBC: Recent Labs  Lab 10/20/17  1830 10/21/17 0539 10/23/17 0848  WBC 8.1 15.3* 8.0  NEUTROABS 4.0 13.3*  --   HGB 16.5 14.7 15.9  HCT 47.1 42.9 46.3  MCV 96.1 97.5 95.9  PLT 212 190 161   Basic Metabolic Panel: Recent Labs  Lab 10/20/17 1912 10/21/17 0539 10/23/17 0848  NA 140 138 136  K 3.8 4.0 3.9  CL 106 105 104  CO2 23 22 25   GLUCOSE 93  123* 97  BUN 10 15 7   CREATININE 0.91 1.02 0.88  CALCIUM 8.5* 8.4* 9.0   GFR: Estimated Creatinine Clearance: 111 mL/min (by C-G formula based on SCr of 0.88 mg/dL). Liver Function Tests: Recent Labs  Lab 10/20/17 1912 10/21/17 0539  AST 33 38  ALT 20 22  ALKPHOS 89 81  BILITOT 0.6 0.7  PROT 7.5 6.8  ALBUMIN 4.2 3.8   No results for input(s): LIPASE, AMYLASE in the last 168 hours. No results for input(s): AMMONIA in the last 168 hours. Coagulation Profile: No results for input(s): INR, PROTIME in the last 168 hours. Cardiac Enzymes: No results for input(s): CKTOTAL, CKMB, CKMBINDEX, TROPONINI in the last 168 hours. BNP (last 3 results) No results for input(s): PROBNP in the last 8760 hours. HbA1C: No results for input(s): HGBA1C in the last 72 hours. CBG: Recent Labs  Lab 10/21/17 1236 10/21/17 1840 10/22/17 0643 10/22/17 1200 10/22/17 1844  GLUCAP 76 114* 90 111* 139*   Lipid Profile: No results for input(s): CHOL, HDL, LDLCALC, TRIG, CHOLHDL, LDLDIRECT in the last 72 hours. Thyroid Function Tests: No results for input(s): TSH, T4TOTAL, FREET4, T3FREE, THYROIDAB in the last 72 hours. Anemia Panel: No results for input(s): VITAMINB12, FOLATE, FERRITIN, TIBC, IRON, RETICCTPCT in the last 72 hours. Urine analysis:    Component Value Date/Time   COLORURINE YELLOW 10/20/2017 2138   APPEARANCEUR CLEAR 10/20/2017 2138   LABSPEC 1.017 10/20/2017 2138   PHURINE 5.0 10/20/2017 2138   GLUCOSEU NEGATIVE 10/20/2017 2138   HGBUR NEGATIVE 10/20/2017 2138   BILIRUBINUR NEGATIVE 10/20/2017 2138   KETONESUR 5 (A) 10/20/2017 2138   PROTEINUR NEGATIVE 10/20/2017 2138   UROBILINOGEN 0.2 06/07/2013 1905   NITRITE NEGATIVE 10/20/2017 2138   LEUKOCYTESUR NEGATIVE 10/20/2017 2138   Sepsis Labs: @LABRCNTIP (procalcitonin:4,lacticidven:4)  )No results found for this or any previous visit (from the past 240 hour(s)).    Studies: No results found.  Scheduled Meds: .  busPIRone  10 mg Oral TID  . clopidogrel  75 mg Oral Daily  . divalproex  500 mg Oral Q12H  . folic acid  1 mg Oral Daily  . lithium carbonate  600 mg Oral QHS  . multivitamin with minerals  1 tablet Oral Daily  . nicotine  21 mg Transdermal Daily  . pneumococcal 23 valent vaccine  0.5 mL Intramuscular Tomorrow-1000  . QUEtiapine  25 mg Oral BID  . QUEtiapine  300 mg Oral QHS  . thiamine  100 mg Oral Daily    Continuous Infusions:    LOS: 5 days     Laverna PeaceShayla D Andrzej Scully, MD [facility] Pager [phone]  If 7PM-7AM, please contact night-coverage [email or web address] Password [email or web address] 10/25/2017, 1:21 PM

## 2017-10-25 NOTE — Plan of Care (Signed)
  Coping: Level of anxiety will decrease 10/25/2017 0600 - Progressing by Burtis Junesrewery, Lauryl Seyer, RN   Patient had atarax for anxiety and it helped decrease symptoms.   Patient refused SCDs after education on VTE prophylaxis Refused seroquel stating is causes severe headache.

## 2017-10-26 NOTE — Progress Notes (Signed)
PROGRESS NOTE  Nathaniel Warren ZOX:[id number] DOB: 01/07/1966 DOA: 10/20/2017 PCP: System, Pcp Not In  HPI/Recap of past 24 hours:  Nathaniel Warren is a 51 y.o. year old male with medical history significant for bipolar disorder, seizure disorder and alcoholism  who presented on 10/20/2017 with concerns for suicide attempt with overdose of Seroquel and hydroxyzine. Of note, patient was recently admitted to inpatient psychiatric facility on 10/18 for suicide attempt.  Patient had witnessed seizure outside of the hospital and again while in the ED requiring Ativan 2 mg IV. Neurology was consulted and placed patient on Depakote. UDS was positive for benzodiazepines.  EKG showed no prolongation in QtC.  Brain MRI on 11/14 showed no acute intracranial abnormality   No events overnight   Assessment/Plan: Principal Problem:   MDD (major depressive disorder), recurrent severe, without psychosis (HCC) Active Problems:   Depression   Alcohol dependence (HCC)   Drug overdose, intentional, initial encounter [facility](HCC)   Drug overdose, intentional self-harm, initial encounter (HCC)   Seizures (HCC)   Drug overdose   Multiple lacunar infarcts  Intentional Overdose ( Seroquel and hydroxyzine).  Awaiting inpatient psychiatric unit admission as  psychiatry has evaluated patient.  Seizure. Stable.  No further episodes since admission.  Likely prompted in the setting of Seroquel overdose.  During workup MRI Brain showed no organic cause. Continue Depakote as recommended by neurology, seizure precautions   Bipolar Disorder, depression with suicidal ideation. Likely not best controlled given recurrent suicide attempts.Marland Kitchen.  [facility] and optimized Seroquel dose; however, patient is declining Seroquel overnight stating severe headaches  Consulted psych, following recommendations: continue home buspar, atarax, lithium, and trazodone, Seroquel 300 mg qhs and 25 mg BID  Ethanol abuse.  Has been without  withdrawal symptoms during hospital course  Continue to monitor on CIWA protocol  Remote Lacunar infarcts.  Found incidentally on MRI brain during workup for seizures.  Patient has remained asymptomatic during hospital course no concern for new neurologic deficits. -Given aspirin allergy ( nosebleeds as child) neurology advises plavix.   Code Status: Full Code   Family Communication: No family at bedside  Disposition Plan: medically ready for discharge, awaiting bed availability for inpatient psych unit, bedside sitter currently     [facility]  [facility]  Procedures:  None   Antimicrobials:  None   Cultures:  None  DVT prophylaxis: No SCDs given SI risk.    Objective: Vitals:   10/25/17 1340 10/25/17 2231 10/26/17 0516 10/26/17 1441  BP: 114/71 109/72 100/63 99/67  Pulse: 75 74 60 74  Resp: 18 18 18 18   Temp: 98 F (36.7 C) 97.9 F (36.6 C) 98 F (36.7 C) 98.2 F (36.8 C)  TempSrc: Oral Oral Oral Oral  SpO2: 99%  98% 96%  Weight:      Height:        Intake/Output Summary (Last 24 hours) at 10/26/2017 2127 Last data filed at 10/26/2017 1831 Gross per 24 hour  Intake 960 ml  Output -  Net 960 ml   Filed Weights   10/20/17 1835 10/21/17 0841  Weight: 86.2 kg (190 lb) 87.1 kg (192 lb 1.6 oz)    Exam:   General:  Lying in bed   Skin: clean,dry, intact  Neurologic: moving all extremities without focal deficits   Psychiatry: flat affect, improved mood   Data Reviewed: CBC: Recent Labs  Lab 10/20/17 1830 10/21/17 0539 10/23/17 0848  WBC 8.1 15.3* 8.0  NEUTROABS 4.0 13.3*  --  HGB 16.5 14.7 15.9  HCT 47.1 42.9 46.3  MCV 96.1 97.5 95.9  PLT 212 190 161   Basic Metabolic Panel: Recent Labs  Lab 10/20/17 1912 10/21/17 0539 10/23/17 0848  NA 140 138 136  K 3.8 4.0 3.9  CL 106 105 104  CO2 23 22 25   GLUCOSE 93 123* 97  BUN 10 15 7   CREATININE 0.91 1.02 0.88  CALCIUM 8.5* 8.4* 9.0   GFR: Estimated Creatinine Clearance:  111 mL/min (by C-G formula based on SCr of 0.88 mg/dL). Liver Function Tests: Recent Labs  Lab 10/20/17 1912 10/21/17 0539  AST 33 38  ALT 20 22  ALKPHOS 89 81  BILITOT 0.6 0.7  PROT 7.5 6.8  ALBUMIN 4.2 3.8   No results for input(s): LIPASE, AMYLASE in the last 168 hours. No results for input(s): AMMONIA in the last 168 hours. Coagulation Profile: No results for input(s): INR, PROTIME in the last 168 hours. Cardiac Enzymes: No results for input(s): CKTOTAL, CKMB, CKMBINDEX, TROPONINI in the last 168 hours. BNP (last 3 results) No results for input(s): PROBNP in the last 8760 hours. HbA1C: No results for input(s): HGBA1C in the last 72 hours. CBG: Recent Labs  Lab 10/21/17 1236 10/21/17 1840 10/22/17 0643 10/22/17 1200 10/22/17 1844  GLUCAP 76 114* 90 111* 139*   Lipid Profile: No results for input(s): CHOL, HDL, LDLCALC, TRIG, CHOLHDL, LDLDIRECT in the last 72 hours. Thyroid Function Tests: No results for input(s): TSH, T4TOTAL, FREET4, T3FREE, THYROIDAB in the last 72 hours. Anemia Panel: No results for input(s): VITAMINB12, FOLATE, FERRITIN, TIBC, IRON, RETICCTPCT in the last 72 hours. Urine analysis:    Component Value Date/Time   COLORURINE YELLOW 10/20/2017 2138   APPEARANCEUR CLEAR 10/20/2017 2138   LABSPEC 1.017 10/20/2017 2138   PHURINE 5.0 10/20/2017 2138   GLUCOSEU NEGATIVE 10/20/2017 2138   HGBUR NEGATIVE 10/20/2017 2138   BILIRUBINUR NEGATIVE 10/20/2017 2138   KETONESUR 5 (A) 10/20/2017 2138   PROTEINUR NEGATIVE 10/20/2017 2138   UROBILINOGEN 0.2 06/07/2013 1905   NITRITE NEGATIVE 10/20/2017 2138   LEUKOCYTESUR NEGATIVE 10/20/2017 2138   Sepsis Labs: @LABRCNTIP (procalcitonin:4,lacticidven:4)  )No results found for this or any previous visit (from the past 240 hour(s)).    Studies: No results found.  Scheduled Meds: . busPIRone  10 mg Oral TID  . clopidogrel  75 mg Oral Daily  . divalproex  500 mg Oral Q12H  . folic acid  1 mg Oral Daily   . lithium carbonate  600 mg Oral QHS  . multivitamin with minerals  1 tablet Oral Daily  . nicotine  21 mg Transdermal Daily  . QUEtiapine  25 mg Oral BID  . QUEtiapine  300 mg Oral QHS  . thiamine  100 mg Oral Daily    Continuous Infusions:    LOS: 6 days     Laverna PeaceShayla D Ainslee Sou, MD [facility] Pager [phone]  If 7PM-7AM, please contact night-coverage [email or web address] Password [email or web address] 10/26/2017, 9:27 PM

## 2017-10-26 NOTE — Progress Notes (Signed)
[facility] and [facility] are reviewing patient to see if they have beds available.   Osborne Cascoadia Alyssia Heese LCSWA [phone]

## 2017-10-27 ENCOUNTER — Encounter (HOSPITAL_COMMUNITY): Payer: Self-pay | Admitting: *Deleted

## 2017-10-27 ENCOUNTER — Inpatient Hospital Stay (HOSPITAL_COMMUNITY)
Admission: AD | Admit: 2017-10-27 | Discharge: 2017-11-07 | DRG: 885 | Disposition: A | Discharge status: Home / Self Care | Payer: Federal, State, Local not specified - Other | Source: Intra-hospital | Attending: Psychiatry | Admitting: Psychiatry

## 2017-10-27 ENCOUNTER — Other Ambulatory Visit: Payer: Self-pay

## 2017-10-27 DIAGNOSIS — F419 Anxiety disorder, unspecified: Secondary | ICD-10-CM | POA: Diagnosis not present

## 2017-10-27 DIAGNOSIS — Z590 Homelessness: Secondary | ICD-10-CM | POA: Diagnosis not present

## 2017-10-27 DIAGNOSIS — Z818 Family history of other mental and behavioral disorders: Secondary | ICD-10-CM | POA: Diagnosis not present

## 2017-10-27 DIAGNOSIS — G4700 Insomnia, unspecified: Secondary | ICD-10-CM | POA: Diagnosis present

## 2017-10-27 DIAGNOSIS — F332 Major depressive disorder, recurrent severe without psychotic features: Principal | ICD-10-CM | POA: Diagnosis present

## 2017-10-27 DIAGNOSIS — F17210 Nicotine dependence, cigarettes, uncomplicated: Secondary | ICD-10-CM | POA: Diagnosis present

## 2017-10-27 DIAGNOSIS — F1310 Sedative, hypnotic or anxiolytic abuse, uncomplicated: Secondary | ICD-10-CM | POA: Diagnosis not present

## 2017-10-27 DIAGNOSIS — R45851 Suicidal ideations: Secondary | ICD-10-CM | POA: Diagnosis present

## 2017-10-27 DIAGNOSIS — F411 Generalized anxiety disorder: Secondary | ICD-10-CM | POA: Diagnosis present

## 2017-10-27 DIAGNOSIS — R450 Nervousness: Secondary | ICD-10-CM | POA: Diagnosis not present

## 2017-10-27 DIAGNOSIS — Z915 Personal history of self-harm: Secondary | ICD-10-CM | POA: Diagnosis not present

## 2017-10-27 DIAGNOSIS — F4010 Social phobia, unspecified: Secondary | ICD-10-CM | POA: Diagnosis not present

## 2017-10-27 DIAGNOSIS — F1994 Other psychoactive substance use, unspecified with psychoactive substance-induced mood disorder: Secondary | ICD-10-CM | POA: Diagnosis not present

## 2017-10-27 DIAGNOSIS — F1020 Alcohol dependence, uncomplicated: Secondary | ICD-10-CM | POA: Diagnosis not present

## 2017-10-27 DIAGNOSIS — K769 Liver disease, unspecified: Secondary | ICD-10-CM | POA: Diagnosis present

## 2017-10-27 DIAGNOSIS — Z9081 Acquired absence of spleen: Secondary | ICD-10-CM | POA: Diagnosis not present

## 2017-10-27 DIAGNOSIS — F4310 Post-traumatic stress disorder, unspecified: Secondary | ICD-10-CM | POA: Diagnosis present

## 2017-10-27 DIAGNOSIS — Z8673 Personal history of transient ischemic attack (TIA), and cerebral infarction without residual deficits: Secondary | ICD-10-CM | POA: Diagnosis not present

## 2017-10-27 DIAGNOSIS — Z560 Unemployment, unspecified: Secondary | ICD-10-CM | POA: Diagnosis not present

## 2017-10-27 MED ORDER — ALBUTEROL SULFATE HFA 108 (90 BASE) MCG/ACT IN AERS: 2.0000 | INHALATION_SPRAY | RESPIRATORY_TRACT | 0 refills | Status: DC | PRN

## 2017-10-27 MED ORDER — HYDROXYZINE HCL 50 MG PO TABS: 50.0000 mg | ORAL_TABLET | Freq: Four times a day (QID) | ORAL | 0 refills | Status: DC | PRN

## 2017-10-27 MED ORDER — QUETIAPINE FUMARATE 300 MG PO TABS: 300.0000 mg | ORAL_TABLET | Freq: Every day | ORAL | Status: DC

## 2017-10-27 MED ORDER — LITHIUM CARBONATE 300 MG PO CAPS: 600.0000 mg | ORAL_CAPSULE | Freq: Every day | ORAL | Status: DC

## 2017-10-27 MED ORDER — FOLIC ACID 1 MG PO TABS: 1.0000 mg | ORAL_TABLET | Freq: Every day | ORAL | Status: DC

## 2017-10-27 MED ORDER — TRAZODONE HCL 100 MG PO TABS: 100.0000 mg | ORAL_TABLET | Freq: Every evening | ORAL | Status: DC | PRN

## 2017-10-27 MED ORDER — NICOTINE 21 MG/24HR TD PT24: 21.0000 mg | MEDICATED_PATCH | Freq: Every day | TRANSDERMAL | 0 refills | Status: DC

## 2017-10-27 MED ORDER — ACETAMINOPHEN 325 MG PO TABS
650.0000 mg | ORAL_TABLET | Freq: Four times a day (QID) | ORAL | Status: DC | PRN
  Administered 2017-10-30: 650 mg via ORAL
  Filled 2017-10-27 (×2): qty 2

## 2017-10-27 MED ORDER — ADULT MULTIVITAMIN W/MINERALS CH: 1.0000 | ORAL_TABLET | Freq: Every day | ORAL | Status: DC

## 2017-10-27 MED ORDER — CLOPIDOGREL BISULFATE 75 MG PO TABS: 75.0000 mg | ORAL_TABLET | Freq: Every day | ORAL | 0 refills | Status: DC

## 2017-10-27 MED ORDER — THIAMINE HCL 100 MG PO TABS: 100.0000 mg | ORAL_TABLET | Freq: Every day | ORAL | Status: DC

## 2017-10-27 MED ORDER — NICOTINE 21 MG/24HR TD PT24
21.0000 mg | MEDICATED_PATCH | Freq: Every day | TRANSDERMAL | Status: DC
  Administered 2017-10-28 – 2017-11-07 (×11): 21 mg via TRANSDERMAL
  Filled 2017-10-27 (×14): qty 1

## 2017-10-27 MED ORDER — DIVALPROEX SODIUM 500 MG PO DR TAB: 500.0000 mg | DELAYED_RELEASE_TABLET | Freq: Two times a day (BID) | ORAL | Status: DC

## 2017-10-27 MED ORDER — MAGNESIUM HYDROXIDE 400 MG/5ML PO SUSP: 30.0000 mL | Freq: Every day | ORAL | Status: DC | PRN

## 2017-10-27 MED ORDER — HYDROXYZINE HCL 25 MG PO TABS
25.0000 mg | ORAL_TABLET | Freq: Three times a day (TID) | ORAL | Status: DC | PRN
  Administered 2017-10-27: 25 mg via ORAL
  Filled 2017-10-27 (×3): qty 1

## 2017-10-27 MED ORDER — LORAZEPAM 1 MG PO TABS
1.0000 mg | ORAL_TABLET | Freq: Four times a day (QID) | ORAL | Status: DC | PRN
  Administered 2017-10-28 – 2017-11-02 (×7): 1 mg via ORAL
  Filled 2017-10-27 (×7): qty 1

## 2017-10-27 MED ORDER — ALUM & MAG HYDROXIDE-SIMETH 200-200-20 MG/5ML PO SUSP
30.0000 mL | ORAL | Status: DC | PRN
  Administered 2017-11-02: 30 mL via ORAL
  Filled 2017-10-27: qty 30

## 2017-10-27 MED ORDER — DIVALPROEX SODIUM 500 MG PO DR TAB
500.0000 mg | DELAYED_RELEASE_TABLET | Freq: Two times a day (BID) | ORAL | Status: DC
  Administered 2017-10-27 – 2017-11-07 (×22): 500 mg via ORAL
  Filled 2017-10-27 (×2): qty 14
  Filled 2017-10-27 (×5): qty 1
  Filled 2017-10-27: qty 14
  Filled 2017-10-27 (×7): qty 1
  Filled 2017-10-27: qty 14
  Filled 2017-10-27 (×11): qty 1

## 2017-10-27 MED ORDER — TRAZODONE HCL 50 MG PO TABS
50.0000 mg | ORAL_TABLET | Freq: Every evening | ORAL | Status: DC | PRN
  Administered 2017-10-27 – 2017-11-02 (×7): 50 mg via ORAL
  Filled 2017-10-27 (×7): qty 1

## 2017-10-27 MED ORDER — QUETIAPINE FUMARATE 25 MG PO TABS: 25.0000 mg | ORAL_TABLET | Freq: Two times a day (BID) | ORAL | Status: DC

## 2017-10-27 MED ORDER — IBUPROFEN 200 MG PO TABS: 600.0000 mg | ORAL_TABLET | Freq: Four times a day (QID) | ORAL | 0 refills | Status: DC | PRN

## 2017-10-27 MED ORDER — BUSPIRONE HCL 10 MG PO TABS: 10.0000 mg | ORAL_TABLET | Freq: Three times a day (TID) | ORAL | 0 refills | Status: DC

## 2017-10-27 NOTE — Progress Notes (Signed)
Recreation Therapy Notes    Animal-Assisted Activity (AAA) Program Checklist/Progress Notes Patient Eligibility Criteria Checklist & Daily Group note for Rec TxIntervention  Date: 11.20.2018 Time: 2:45am [address]   AAA/T Program Assumption of Risk Form signed by Patient/ or Parent Legal Guardian Yes  Patient is free of allergies or sever asthma Yes  Patient reports no fear of animals Yes  Patient reports no history of cruelty to animals Yes  Patient understands his/her participation is voluntary Yes  Behavioral Response: Did not attend.   Marykay Lexenise L Kearra Calkin, LRT/CTRS        Remer Couse L 10/27/2017 3:05 PM

## 2017-10-27 NOTE — Progress Notes (Signed)
[facility]tic[facility]ated [facility] date: 10/27/17 Family notified: Patient calling his girlfriend to let her know Transport by: Juel [place] scheduled at 12:30pm   Per MD [facility]. RN, patient, patient's family, and facility notified of DC. [facility].   CSW signing off.  Cristobal GoldmannNadia Chamara Dyck, [place]CSWA Clinical Social Worker [phone]

## 2017-10-27 NOTE — Discharge Summary (Signed)
Discharge Summary  Nathaniel Warren ZOX:[id number] DOB: 1966/02/12  PCP: System, Pcp Not In  Admit date: 10/20/2017 Discharge date: 10/27/2017  Time spent: 25  inutes Admitted From: Home Disposition:  Inpatient Psychiatric Facility  Recommendations for Outpatient Follow-up:  1. Follow up with PCP in 1-2 weeks: on plavix for remote history of lacunar infarcts. Secondary prevention needed (hypertension, high cholesterol evaluation)   Home Health:No Equipment/Devices:None  Discharge Diagnoses:  Active Hospital Problems   Diagnosis Date Noted  . MDD (major depressive disorder), recurrent severe, without psychosis (HCC) 04/10/2013    Class: Acute  . Multiple lacunar infarcts 10/21/2017  . Drug overdose, intentional, initial encounter (HCC) 10/20/2017  . Drug overdose, intentional self-harm, initial encounter (HCC) 10/20/2017  . Seizures (HCC) 10/20/2017  . Drug overdose 10/20/2017  . Alcohol dependence (HCC) 06/19/2013    Class: Acute  . Depression 01/11/2013    Resolved Hospital Problems  No resolved problems to display.    Discharge Condition: Stable  CODE STATUS:FULL Diet recommendation: Heart Healthy   Vitals:   10/27/17 0039 10/27/17 0644  BP: 104/66 103/60  Pulse: 68 64  Resp: 18 18  Temp: 97.6 F (36.4 C) 97.7 F (36.5 C)  SpO2: 97% 97%    History of present illness:  YOVANY CLOCK is a 51 y.o. year old male with medical history significant for bipolar disorder, seizure disorder and alcoholism  who presented on 10/20/2017 with concerns for suicide attempt with overdose of Seroquel and hydroxyzine.   Remaining hospital course addressed in problem based format below:   Hospital Course:  Principal Problem:   MDD (major depressive disorder), recurrent severe, without psychosis (HCC) Active Problems:   Depression   Alcohol dependence (HCC)   Drug overdose, intentional, initial encounter (HCC)   Drug overdose, intentional self-harm, initial encounter  (HCC)   Seizures (HCC)   Drug overdose   Multiple lacunar infarcts  Intentional Overdose ( Seroquel and hydroxyzine).  Bipolar Disorder, depression with suicidal ideation.  Of note, patient was recently admitted to inpatient psychiatric facility on 10/18 for suicide attempt.  He was brought to the ED by his girlfriend after overdosing on Seroquel (30 tablets of 300 mg) and hydroxyzine. Patient had witnessed seizure outside of the hospital and again while in the ED requiring Ativan 2 mg IV. Neurology was consulted and placed patient on Depakote. UDS was positive for benzodiazepines.  EKG showed no prolongation in QtC.  Psychiatry evaluated patient and made the following  recommendations: Patient was continued on home BuSpar 10 mg 4 times daily, Atarax50 mg 4 times daily, lithium 600 mg nightly and trazodone 100 mg nightly.  Seroquel 300 nightly and 25 twice daily.  Psychiatry recommended inpatient psychiatric admission.  Witnessed seizures, resolved  Brain MRI on 11/14 showed no acute intracranial abnormality.  Neurology assessed patient believe seizures related to poor medication adherence and neuroleptic overdose.  Patient was loaded with Depakote and maintain on Depakote throughout hospitalization and remained seizure-free.  History of remote lacunar stroke Incidental finding on recent brain MRI.  Consistent with prior stroke.  Per neurology patient was started on Plavix for antiplatelet therapy.  Patient will need follow-up with PCP for secondary stroke prevention measures.  Ethanol abuse Patient was was without withdrawal symptoms throughout hospitalization.  Patient was monitored on CIWA protocol.   Consultations: Psychiatry, Dr. Sharma Covert  Procedures/Studies: Ct Head Wo Contrast  Result Date: 10/20/2017 CLINICAL DATA:  Seizure, possible overdose EXAM: CT HEAD WITHOUT CONTRAST TECHNIQUE: Contiguous axial images were obtained from the base  of the skull through the vertex without  intravenous contrast. COMPARISON:  12/14/2016, 10/04/2013 FINDINGS: Brain: No acute territorial infarction, hemorrhage, or intracranial mass is visualized. Old lacunar infarcts in the right cerebellum and left caudate. Normal ventricle size. Vascular: No hyperdense vessel or unexpected calcification. Skull: Normal. Negative for fracture or focal lesion. Sinuses/Orbits: No acute finding. Other: None IMPRESSION: No CT evidence for acute intracranial abnormality. Electronically Signed   By: Jasmine PangKim  Fujinaga M.D.   On: 10/20/2017 19:06   Mr Laqueta JeanBrain W ZOWo Contrast  Result Date: 10/21/2017 CLINICAL DATA:  Seizure comminuted, drug related, nontraumatic. Overdose was Seroquel. Two witnessed seizures. EXAM: MRI HEAD WITHOUT AND WITH CONTRAST TECHNIQUE: Multiplanar, multiecho pulse sequences of the brain and surrounding structures were obtained without and with intravenous contrast. CONTRAST:  18mL MULTIHANCE GADOBENATE DIMEGLUMINE 529 MG/ML IV SOLN COMPARISON:  MRI brain 12/14/2016.  CT head 10/20/2017. FINDINGS: Brain: No acute infarct, hemorrhage, or mass lesion is present. A remote lacunar infarct in the left caudate is stable. Remote lacunar infarcts of the cerebellum are stable. No new infarcts are present. The ventricles are of normal size. Dedicated imaging of the temporal lobes demonstrates symmetric size and signal of the hippocampal structures bilaterally. Postcontrast images demonstrate no pathologic enhancement. Vascular: Flow is present in the major intracranial arteries. A benign developmental venous anomaly is present in the medial right parietal lobe near the splenium of the corpus callosum. Skull and upper cervical spine: The skullbase is within normal limits. The craniocervical junction is normal. Marrow signal is normal. Sinuses/Orbits: Chronic left maxillary and ethmoid mucosal thickening is present. The remaining paranasal sinuses are clear. The mastoid air cells are clear bilaterally. No obstructing  nasopharyngeal lesion is present. The globes and orbits are within normal limits. IMPRESSION: 1. No acute or focal lesion to explain seizures. 2. Remote lacunar infarcts are stable. 3. Left maxillary and ethmoid mucosal disease as described. Electronically Signed   By: Marin Robertshristopher  Mattern M.D.   On: 10/21/2017 14:39    Discharge Exam: BP 103/60 (BP Location: Right Arm)   Pulse 64   Temp 97.7 F (36.5 C) (Oral)   Resp 18   Ht 5\' 9"  (1.753 m)   Wt 87.1 kg (192 lb 1.6 oz)   SpO2 97%   BMI 28.37 kg/m   General: Lying in bed comfortably, in no apparent distress Cardiovascular: No peripheral edema, regular rate and rhythm Respiratory: Normal breath sounds throughout, on room air  Discharge Instructions You were cared for by a hospitalist during your hospital stay. If you have any questions about your discharge medications or the care you received while you were in the hospital after you are discharged, you can call the unit and asked to speak with the hospitalist on call if the hospitalist that took care of you is not available. Once you are discharged, your primary care physician will handle any further medical issues. Please note that NO REFILLS for any discharge medications will be authorized once you are discharged, as it is imperative that you return to your primary care physician (or establish a relationship with a primary care physician if you do not have one) for your aftercare needs so that they can reassess your need for medications and monitor your lab values.  Discharge Instructions    Diet - low sodium heart healthy   Complete by:  As directed    Increase activity slowly   Complete by:  As directed      Allergies as of 10/27/2017  Reactions   Asa [aspirin] Other (See Comments)   As a child, had nose bleed.       Medication List    STOP taking these medications   gabapentin 300 MG capsule Commonly known as:  NEURONTIN     TAKE these medications   albuterol 108  (90 Base) MCG/ACT inhaler Commonly known as:  PROVENTIL HFA;VENTOLIN HFA Inhale 2 puffs into the lungs every 4 (four) hours as needed for wheezing.   busPIRone 10 MG tablet Commonly known as:  BUSPAR Take 1 tablet (10 mg total) by mouth 3 (three) times daily. For anxiety symptoms   clopidogrel 75 MG tablet Commonly known as:  PLAVIX Take 1 tablet (75 mg total) by mouth daily. Start taking on:  10/28/2017   divalproex 500 MG DR tablet Commonly known as:  DEPAKOTE Take 1 tablet (500 mg total) by mouth every 12 (twelve) hours.   folic acid 1 MG tablet Commonly known as:  FOLVITE Take 1 tablet (1 mg total) by mouth daily. Start taking on:  10/28/2017   hydrOXYzine 50 MG tablet Commonly known as:  ATARAX/VISTARIL Take 1 tablet (50 mg total) by mouth 4 (four) times daily as needed for anxiety.   ibuprofen 200 MG tablet Commonly known as:  ADVIL,MOTRIN Take 3 tablets (600 mg total) by mouth every 6 (six) hours as needed for mild pain.   lithium carbonate 300 MG capsule Take 2 capsules (600 mg total) by mouth at bedtime.   multivitamin with minerals Tabs tablet Take 1 tablet by mouth daily. Start taking on:  10/28/2017   nicotine 21 mg/24hr patch Commonly known as:  NICODERM CQ - dosed in mg/24 hours Place 1 patch (21 mg total) onto the skin daily. Start taking on:  10/28/2017   QUEtiapine 25 MG tablet Commonly known as:  SEROQUEL Take 1 tablet (25 mg total) by mouth 2 (two) times daily. FOR AGITATION, ANXIETY, AND INSOMNIA   QUEtiapine 300 MG tablet Commonly known as:  SEROQUEL Take 1 tablet (300 mg total) by mouth at bedtime.   thiamine 100 MG tablet Take 1 tablet (100 mg total) by mouth daily.   traZODone 100 MG tablet Commonly known as:  DESYREL Take 1 tablet (100 mg total) by mouth at bedtime as needed (ANXIETY).      Allergies  Allergen Reactions  . Asa [Aspirin] Other (See Comments)    As a child, had nose bleed.       The results of significant  diagnostics from this hospitalization (including imaging, microbiology, ancillary and laboratory) are listed below for reference.    Significant Diagnostic Studies: Ct Head Wo Contrast  Result Date: 10/20/2017 CLINICAL DATA:  Seizure, possible overdose EXAM: CT HEAD WITHOUT CONTRAST TECHNIQUE: Contiguous axial images were obtained from the base of the skull through the vertex without intravenous contrast. COMPARISON:  12/14/2016, 10/04/2013 FINDINGS: Brain: No acute territorial infarction, hemorrhage, or intracranial mass is visualized. Old lacunar infarcts in the right cerebellum and left caudate. Normal ventricle size. Vascular: No hyperdense vessel or unexpected calcification. Skull: Normal. Negative for fracture or focal lesion. Sinuses/Orbits: No acute finding. Other: None IMPRESSION: No CT evidence for acute intracranial abnormality. Electronically Signed   By: Jasmine Pang M.D.   On: 10/20/2017 19:06   Mr Laqueta Jean JX Contrast  Result Date: 10/21/2017 CLINICAL DATA:  Seizure comminuted, drug related, nontraumatic. Overdose was Seroquel. Two witnessed seizures. EXAM: MRI HEAD WITHOUT AND WITH CONTRAST TECHNIQUE: Multiplanar, multiecho pulse sequences of the brain and surrounding structures were  obtained without and with intravenous contrast. CONTRAST:  18mL MULTIHANCE GADOBENATE DIMEGLUMINE 529 MG/ML IV SOLN COMPARISON:  MRI brain 12/14/2016.  CT head 10/20/2017. FINDINGS: Brain: No acute infarct, hemorrhage, or mass lesion is present. A remote lacunar infarct in the left caudate is stable. Remote lacunar infarcts of the cerebellum are stable. No new infarcts are present. The ventricles are of normal size. Dedicated imaging of the temporal lobes demonstrates symmetric size and signal of the hippocampal structures bilaterally. Postcontrast images demonstrate no pathologic enhancement. Vascular: Flow is present in the major intracranial arteries. A benign developmental venous anomaly is present in the  medial right parietal lobe near the splenium of the corpus callosum. Skull and upper cervical spine: The skullbase is within normal limits. The craniocervical junction is normal. Marrow signal is normal. Sinuses/Orbits: Chronic left maxillary and ethmoid mucosal thickening is present. The remaining paranasal sinuses are clear. The mastoid air cells are clear bilaterally. No obstructing nasopharyngeal lesion is present. The globes and orbits are within normal limits. IMPRESSION: 1. No acute or focal lesion to explain seizures. 2. Remote lacunar infarcts are stable. 3. Left maxillary and ethmoid mucosal disease as described. Electronically Signed   By: Marin Robertshristopher  Mattern M.D.   On: 10/21/2017 14:39    Microbiology: No results found for this or any previous visit (from the past 240 hour(s)).   Labs: Basic Metabolic Panel: Recent Labs  Lab 10/20/17 1912 10/21/17 0539 10/23/17 0848  NA 140 138 136  K 3.8 4.0 3.9  CL 106 105 104  CO2 23 22 25   GLUCOSE 93 123* 97  BUN 10 15 7   CREATININE 0.91 1.02 0.88  CALCIUM 8.5* 8.4* 9.0   Liver Function Tests: Recent Labs  Lab 10/20/17 1912 10/21/17 0539  AST 33 38  ALT 20 22  ALKPHOS 89 81  BILITOT 0.6 0.7  PROT 7.5 6.8  ALBUMIN 4.2 3.8   No results for input(s): LIPASE, AMYLASE in the last 168 hours. No results for input(s): AMMONIA in the last 168 hours. CBC: Recent Labs  Lab 10/20/17 1830 10/21/17 0539 10/23/17 0848  WBC 8.1 15.3* 8.0  NEUTROABS 4.0 13.3*  --   HGB 16.5 14.7 15.9  HCT 47.1 42.9 46.3  MCV 96.1 97.5 95.9  PLT 212 190 161   Cardiac Enzymes: No results for input(s): CKTOTAL, CKMB, CKMBINDEX, TROPONINI in the last 168 hours. BNP: BNP (last 3 results) No results for input(s): BNP in the last 8760 hours.  ProBNP (last 3 results) No results for input(s): PROBNP in the last 8760 hours.  CBG: Recent Labs  Lab 10/21/17 1236 10/21/17 1840 10/22/17 0643 10/22/17 1200 10/22/17 1844  GLUCAP 76 114* 90 111* 139*         Signed:  Laverna PeaceShayla D Nettey, MD [facility] 10/27/2017, 11:05 AM

## 2017-10-27 NOTE — Progress Notes (Signed)
Nathaniel Warren is a 51 year old male pt admitted on voluntary basis. On admission, Nathaniel Warren appears anxious and fidgety and spoke about how he took the overdose in a suicide attempt. He endorses passive SI on admission but able to contract for safety while in the hospital. He reports that he is not taking his medications like he should and spoke about how [facility] and also spoke about how he thinks he needs an ACT team. He reports that his girlfriend, Nathaniel Warren, is his biggest support person. He reports that he is homeless and unsure of where he will go after discharge. Nathaniel Warren was oriented to the unit and safety maintained.

## 2017-10-27 NOTE — Progress Notes (Signed)
[facility] to see if they are able to accept the patient.   Osborne Cascoadia Aquila Delaughter LCSWA [phone]

## 2017-10-27 NOTE — Progress Notes (Signed)
[facility]   Bed 3022 Accepting MD is [place] Please call report to [phone] before transportation arrives.  Osborne Cascoadia Amor Hyle LCSWA [phone]

## 2017-10-27 NOTE — Progress Notes (Signed)
Laurence SpatesJames T Meisenheimer [facility].  Discussed with the patient and all questions fully answered.  VSS, Skin clean, dry and intact without evidence of skin break down, no evidence of skin tears noted. IV catheter discontinued intact. Site without signs and symptoms of complications. Dressing and pressure applied.  An After Visit Summary was printed and given to the patient. Patient received prescription.  Allergies as of 10/27/2017      Reactions   Asa [aspirin] Other (See Comments)   As a child, had nose bleed.       Medication List    STOP taking these medications   gabapentin 300 MG capsule Commonly known as:  NEURONTIN     TAKE these medications   albuterol 108 (90 Base) MCG/ACT inhaler Commonly known as:  PROVENTIL HFA;VENTOLIN HFA Inhale 2 puffs into the lungs every 4 (four) hours as needed for wheezing.   busPIRone 10 MG tablet Commonly known as:  BUSPAR Take 1 tablet (10 mg total) by mouth 3 (three) times daily. For anxiety symptoms   clopidogrel 75 MG tablet Commonly known as:  PLAVIX Take 1 tablet (75 mg total) by mouth daily. Start taking on:  10/28/2017   divalproex 500 MG DR tablet Commonly known as:  DEPAKOTE Take 1 tablet (500 mg total) by mouth every 12 (twelve) hours.   folic acid 1 MG tablet Commonly known as:  FOLVITE Take 1 tablet (1 mg total) by mouth daily. Start taking on:  10/28/2017   hydrOXYzine 50 MG tablet Commonly known as:  ATARAX/VISTARIL Take 1 tablet (50 mg total) by mouth 4 (four) times daily as needed for anxiety.   ibuprofen 200 MG tablet Commonly known as:  ADVIL,MOTRIN Take 3 tablets (600 mg total) by mouth every 6 (six) hours as needed for mild pain.   lithium carbonate 300 MG capsule Take 2 capsules (600 mg total) by mouth at bedtime.   multivitamin with minerals Tabs tablet Take 1 tablet by mouth daily. Start taking on:  10/28/2017   nicotine 21 mg/24hr patch Commonly known as:  NICODERM CQ - dosed in mg/24  hours Place 1 patch (21 mg total) onto the skin daily. Start taking on:  10/28/2017   QUEtiapine 25 MG tablet Commonly known as:  SEROQUEL Take 1 tablet (25 mg total) by mouth 2 (two) times daily. FOR AGITATION, ANXIETY, AND INSOMNIA   QUEtiapine 300 MG tablet Commonly known as:  SEROQUEL Take 1 tablet (300 mg total) by mouth at bedtime.   thiamine 100 MG tablet Take 1 tablet (100 mg total) by mouth daily.   traZODone 100 MG tablet Commonly known as:  DESYREL Take 1 tablet (100 mg total) by mouth at bedtime as needed (ANXIETY).       D/c education completed with patient/family including follow up instructions, medication list, d/c activities limitations if indicated, with other d/c instructions as indicated by MD - patient able to verbalize understanding, all questions fully answered.   Patient instructed to return to ED, call 911, or call MD for any changes in condition.   Patient escorted via WC, and D/C [facility] via [facility].  Orene DesanctisSTANISHA  Taveon Enyeart 10/27/2017 11:45 AM

## 2017-10-27 NOTE — Tx Team (Signed)
Initial Treatment Plan 10/27/2017 3:33 PM Nathaniel SpatesJames T Ullmer ZOX:[id number]:[id number]    PATIENT STRESSORS: Medication change or noncompliance Substance abuse   PATIENT STRENGTHS: Ability for insight Active sense of humor Average or above average intelligence Capable of independent living General fund of knowledge Motivation for treatment/growth   PATIENT IDENTIFIED PROBLEMS: Substance Abuse Depression Suicidal thoughts "I'd like to talk to a Child psychotherapistsocial worker about an ACT team"                     DISCHARGE CRITERIA:  Ability to meet basic life and health needs Improved stabilization in mood, thinking, and/or behavior Verbal commitment to aftercare and medication compliance Withdrawal symptoms are absent or subacute and managed without 24-hour nursing intervention  PRELIMINARY DISCHARGE PLAN: Attend aftercare/continuing care group Placement in alternative living arrangements  PATIENT/FAMILY INVOLVEMENT: This treatment plan has been presented to and reviewed with the patient, Nathaniel Warren, and/or family member, .  The patient and family have been given the opportunity to ask questions and make suggestions.  Jenevie Casstevens, [place], [place] 10/27/2017, 3:33 PM

## 2017-10-27 NOTE — Plan of Care (Signed)
Patient progressing 

## 2017-10-28 DIAGNOSIS — F419 Anxiety disorder, unspecified: Secondary | ICD-10-CM

## 2017-10-28 DIAGNOSIS — F17210 Nicotine dependence, cigarettes, uncomplicated: Secondary | ICD-10-CM

## 2017-10-28 DIAGNOSIS — F4010 Social phobia, unspecified: Secondary | ICD-10-CM

## 2017-10-28 DIAGNOSIS — G4700 Insomnia, unspecified: Secondary | ICD-10-CM

## 2017-10-28 DIAGNOSIS — F4310 Post-traumatic stress disorder, unspecified: Secondary | ICD-10-CM

## 2017-10-28 DIAGNOSIS — R450 Nervousness: Secondary | ICD-10-CM

## 2017-10-28 DIAGNOSIS — F1020 Alcohol dependence, uncomplicated: Secondary | ICD-10-CM

## 2017-10-28 DIAGNOSIS — Z91410 Personal history of adult physical and sexual abuse: Secondary | ICD-10-CM

## 2017-10-28 DIAGNOSIS — F332 Major depressive disorder, recurrent severe without psychotic features: Principal | ICD-10-CM

## 2017-10-28 LAB — TSH: TSH: 3.734 u[IU]/mL (ref 0.350–4.500)

## 2017-10-28 LAB — LIPID PANEL
CHOLESTEROL: 194 mg/dL (ref 0–200)
HDL: 47 mg/dL (ref >40)
LDL CALC: 115 mg/dL — AB (ref 0–99)
TRIGLYCERIDES: 159 mg/dL — AB (ref <150)
Total CHOL/HDL Ratio: 4.1 RATIO
VLDL: 32 mg/dL (ref 0–40)

## 2017-10-28 LAB — VALPROIC ACID LEVEL: Valproic Acid Lvl: 76 ug/mL (ref 50.0–100.0)

## 2017-10-28 LAB — HEMOGLOBIN A1C
Hgb A1c MFr Bld: 5.3 % (ref 4.8–5.6)
Mean Plasma Glucose: 105.41 mg/dL

## 2017-10-28 MED ORDER — QUETIAPINE FUMARATE 25 MG PO TABS
25.0000 mg | ORAL_TABLET | Freq: Four times a day (QID) | ORAL | Status: DC | PRN
  Administered 2017-10-29 – 2017-11-05 (×6): 25 mg via ORAL
  Filled 2017-10-28: qty 10
  Filled 2017-10-28 (×7): qty 1

## 2017-10-28 MED ORDER — QUETIAPINE FUMARATE 100 MG PO TABS
100.0000 mg | ORAL_TABLET | Freq: Every day | ORAL | Status: DC
  Administered 2017-10-28 – 2017-10-31 (×4): 100 mg via ORAL
  Filled 2017-10-28 (×5): qty 1

## 2017-10-28 MED ORDER — HYDROXYZINE HCL 50 MG PO TABS
50.0000 mg | ORAL_TABLET | Freq: Three times a day (TID) | ORAL | Status: DC | PRN
  Administered 2017-10-28 – 2017-11-07 (×20): 50 mg via ORAL
  Filled 2017-10-28 (×6): qty 1
  Filled 2017-10-28: qty 10
  Filled 2017-10-28 (×7): qty 1
  Filled 2017-10-28: qty 10
  Filled 2017-10-28 (×6): qty 1

## 2017-10-28 NOTE — Tx Team (Signed)
Interdisciplinary Treatment and Diagnostic Plan Update  10/28/2017 Time of Session: 0830AM Nathaniel Warren MRN: [id number]  Principal Diagnosis: MDD, recurrent, severe without psychosis   Secondary Diagnoses: Active Problems:   MDD (major depressive disorder), recurrent severe, without psychosis ([place])   Current Medications:  Current Facility-Administered Medications  Medication Dose Route Frequency Provider Last Rate Last Dose  . acetaminophen (TYLENOL) tablet 650 mg  650 mg Oral Q6H PRN Okonkwo, Justina A, NP      . alum & mag hydroxide-simeth (MAALOX/MYLANTA) 200-200-20 MG/5ML suspension 30 mL  30 mL Oral Q4H PRN Okonkwo, Justina A, NP      . divalproex (DEPAKOTE) DR tablet 500 mg  500 mg Oral Q12H Derrill Center, NP   500 mg at 10/28/17 0731  . hydrOXYzine (ATARAX/VISTARIL) tablet 25 mg  25 mg Oral TID PRN Hughie Closs A, NP   25 mg at 10/27/17 2210  . LORazepam (ATIVAN) tablet 1 mg  1 mg Oral Q6H PRN Derrill Center, NP      . magnesium hydroxide (MILK OF MAGNESIA) suspension 30 mL  30 mL Oral Daily PRN Okonkwo, Justina A, NP      . nicotine (NICODERM CQ - dosed in mg/24 hours) patch 21 mg  21 mg Transdermal Daily Izediuno, Laruth Bouchard, MD   21 mg at 10/28/17 0731  . traZODone (DESYREL) tablet 50 mg  50 mg Oral QHS PRN Okonkwo, Justina A, NP   50 mg at 10/27/17 2210   PTA Medications: Medications Prior to Admission  Medication Sig Dispense Refill Last Dose  . albuterol (PROVENTIL HFA;VENTOLIN HFA) 108 (90 Base) MCG/ACT inhaler Inhale 2 puffs into the lungs every 4 (four) hours as needed for wheezing. 1 Inhaler 0   . busPIRone (BUSPAR) 10 MG tablet Take 1 tablet (10 mg total) by mouth 3 (three) times daily. For anxiety symptoms 90 tablet 0   . clopidogrel (PLAVIX) 75 MG tablet Take 1 tablet (75 mg total) by mouth daily. 30 tablet 0   . divalproex (DEPAKOTE) 500 MG DR tablet Take 1 tablet (500 mg total) by mouth every 12 (twelve) hours.     . folic acid (FOLVITE) 1 MG tablet Take  1 tablet (1 mg total) by mouth daily.     . hydrOXYzine (ATARAX/VISTARIL) 50 MG tablet Take 1 tablet (50 mg total) by mouth 4 (four) times daily as needed for anxiety. 30 tablet 0   . ibuprofen (ADVIL,MOTRIN) 200 MG tablet Take 3 tablets (600 mg total) by mouth every 6 (six) hours as needed for mild pain. 30 tablet 0   . lithium carbonate 300 MG capsule Take 2 capsules (600 mg total) by mouth at bedtime.     . Multiple Vitamin (MULTIVITAMIN WITH MINERALS) TABS tablet Take 1 tablet by mouth daily.     . nicotine (NICODERM CQ - DOSED IN MG/24 HOURS) 21 mg/24hr patch Place 1 patch (21 mg total) onto the skin daily. 28 patch 0   . QUEtiapine (SEROQUEL) 25 MG tablet Take 1 tablet (25 mg total) by mouth 2 (two) times daily. FOR AGITATION, ANXIETY, AND INSOMNIA     . QUEtiapine (SEROQUEL) 300 MG tablet Take 1 tablet (300 mg total) by mouth at bedtime.     . thiamine 100 MG tablet Take 1 tablet (100 mg total) by mouth daily.     . traZODone (DESYREL) 100 MG tablet Take 1 tablet (100 mg total) by mouth at bedtime as needed (ANXIETY).       Patient Stressors:  Medication change or noncompliance Substance abuse  Patient Strengths: Ability for insight Active sense of humor Average or above average intelligence Capable of independent living General fund of knowledge Motivation for treatment/growth  Treatment Modalities: Medication Management, Group therapy, Case management,  1 to 1 session with clinician, Psychoeducation, Recreational therapy.   Physician Treatment Plan for Primary Diagnosis: MDD, recurrent, severe without psychosis  Long Term Goal(s):     Short Term Goals:    Medication Management: Evaluate patient's response, side effects, and tolerance of medication regimen.  Therapeutic Interventions: 1 to 1 sessions, Unit Group sessions and Medication administration.  Evaluation of Outcomes: Not Met  Physician Treatment Plan for Secondary Diagnosis: Active Problems:   MDD (major  depressive disorder), recurrent severe, without psychosis ([place])  Long Term Goal(s):     Short Term Goals:       Medication Management: Evaluate patient's response, side effects, and tolerance of medication regimen.  Therapeutic Interventions: 1 to 1 sessions, Unit Group sessions and Medication administration.  Evaluation of Outcomes: Not Met   RN Treatment Plan for Primary Diagnosis: MDD, recurrent, severe without psychosis  Long Term Goal(s): Knowledge of disease and therapeutic regimen to maintain health will improve  Short Term Goals: Ability to remain free from injury will improve, Ability to verbalize feelings will improve and Ability to disclose and discuss suicidal ideas  Medication Management: RN will administer medications as ordered by provider, will assess and evaluate patient's response and provide education to patient for prescribed medication. RN will report any adverse and/or side effects to prescribing provider.  Therapeutic Interventions: 1 on 1 counseling sessions, Psychoeducation, Medication administration, Evaluate responses to treatment, Monitor vital signs and CBGs as ordered, Perform/monitor CIWA, COWS, AIMS and Fall Risk screenings as ordered, Perform wound care treatments as ordered.  Evaluation of Outcomes: Not Met   LCSW Treatment Plan for Primary Diagnosis: MDD, recurrent, severe without psychosis  Long Term Goal(s): Safe transition to appropriate next level of care at discharge, Engage patient in therapeutic group addressing interpersonal concerns.  Short Term Goals: Engage patient in aftercare planning with referrals and resources, Facilitate acceptance of mental health diagnosis and concerns and Facilitate patient progression through stages of change regarding substance use diagnoses and concerns  Therapeutic Interventions: Assess for all discharge needs, 1 to 1 time with Social worker, Explore available resources and support systems, Assess for adequacy  in community support network, Educate family and significant other(s) on suicide prevention, Complete Psychosocial Assessment, Interpersonal group therapy.  Evaluation of Outcomes: Not Met   Progress in Treatment: Attending groups: No. New to unit. Continuing to assess.  Participating in groups: No. Taking medication as prescribed: Yes. Toleration medication: Yes. Family/Significant other contact made: No, will contact:  family member if patient consents Patient understands diagnosis: Yes. Discussing patient identified problems/goals with staff: Yes. Medical problems stabilized or resolved: Yes. Denies suicidal/homicidal ideation: No, pt continues to endorse passive SI/able to contract for safety on the unit.  Issues/concerns per patient self-inventory: No. Other: n/a  New problem(s) identified: No, Describe:  n/a  New Short Term/Long Term Goal(s):  Discharge Plan or Barriers: CSW assessing for appropriate referrals. Pt reports that he is homeless and has been going to [facility] for outpatient mental health services. Last admission in 2014, pt was referred [facility]. CSW exploring options with pt.   Reason for Continuation of Hospitalization: Anxiety Depression Medication stabilization Suicidal ideation  Estimated Length of Stay: Monday, 11/02/17  Attendees: Patient: 10/28/2017 8:15 AM  Physician: Dr. Sanjuana Letters MD;  Dr. Nancy Fetter MD 10/28/2017 8:15 AM  Nursing: Melbourne Abts RN 10/28/2017 8:15 AM  RN Care Manager:x 10/28/2017 8:15 AM  Social Worker: Press photographer, LCSW 10/28/2017 8:15 AM  Recreational Therapist: x 10/28/2017 8:15 AM  Other: Lindell Spar NP; Darnelle Maffucci Money NP 10/28/2017 8:15 AM  Other:  10/28/2017 8:15 AM  Other: 10/28/2017 8:15 AM    Scribe for Treatment Team: [place], LCSW 10/28/2017 8:15 AM

## 2017-10-28 NOTE — [facility] Suicide Risk Assessment (Signed)
[facility] INPATIENT:  Family/Significant Other Suicide Prevention Education  Suicide Prevention Education:  Patient Refusal for Family/Significant Other Suicide Prevention Education: The patient Nathaniel Warren has refused to provide written consent for family/significant other to be provided Family/Significant Other Suicide Prevention Education during admission and/or prior to discharge.  Physician notified.  SPE completed with pt, as pt refused to consent to family contact. SPI pamphlet provided to pt and pt was encouraged to share information with support network, ask questions, and talk about any concerns relating to SPE. Pt denies access to guns/firearms and verbalized understanding of information provided. Mobile Crisis information also provided to pt.   Chanz Cahall N Smart LCSW 10/28/2017, 11:05 AM

## 2017-10-28 NOTE — Progress Notes (Signed)
Pt attended NA group this evening.  

## 2017-10-28 NOTE — Progress Notes (Signed)
Recreation Therapy Notes  Date: 10/28/17 Time: 0930 Location: [address] Dayroom  Group Topic: Stress Management  Goal Area(s) Addresses:  Patient will verbalize importance of using healthy stress management.  Patient will identify positive emotions associated with healthy stress management.   Behavioral Response: Engaged  Intervention: Stress Management  Activity :  Body Scan.  LRT introduced the stress management technique of guided body scan.  LRT played a body scan meditation from the Calm app that allowed patients to focus on whatever sensations they may have been feeling and what those sensations felt like to them.   Education:  Stress Management, Discharge Planning.   Education Outcome: Acknowledges edcuation/In group clarification offered/Needs additional education  Clinical Observations/Feedback: Pt attended group.   Caroll RancherMarjette Valine Drozdowski, LRT/CTRS         Caroll RancherLindsay, Emett Stapel A 10/28/2017 12:18 PM

## 2017-10-28 NOTE — [facility] Suicide Risk Assessment (Signed)
[facility]   Nursing information obtained from:    Demographic factors:    Current Mental Status:    Loss Factors:    Historical Factors:    Risk Reduction Factors:     Total Time spent with patient: 45 minutes Principal Problem: MDD (major depressive disorder), recurrent severe, without psychosis ([facility]) Diagnosis:   Patient Active Problem List   Diagnosis Date Noted  . Multiple lacunar infarcts [I63.81] 10/21/2017  . Drug overdose, intentional, initial encounter (HCC) [T50.902A] 10/20/2017  . Drug overdose, intentional self-harm, initial encounter (HCC) [T50.902A] 10/20/2017  . Seizures (HCC) [R56.9] 10/20/2017  . Drug overdose [T50.901A] 10/20/2017  . Alcohol dependence (HCC) [F10.20] 06/19/2013    Class: Acute  . MDD (major depressive disorder), recurrent severe, without psychosis (HCC) [F33.2] 04/10/2013    Class: Acute  . GAD (generalized anxiety disorder) [F41.1] 04/10/2013  . Depression [F32.9] 01/11/2013  . Anxiety [F41.9] 01/11/2013   Subjective Data:  Nathaniel Warren is a  51 y/o M with history of MDD who was admitted with worsening depression and recent suicide attempt via overdose of alcohol, seroquel, gabapentin, and vistaril. Pt shares his reasons for admission, stating, "I attempted suicide, and I had tried once before on October 12th too." Pt reports that he did not choose which medications to overdose with but instead just poured out what available pills were around and took them rapidly. Pt reports that he does not use alcohol except in the context of attempting overdose. Pt cites ongoing SI related to stressors of unemployment, homelessness for 5 months, and having no transportation. Pt reports that [facility] and he was discharged on combination of lithium, depakote, and seroquel which pt reports made his head feel "weird as hell." Pt had seizure activity after his initial admission and he was started on  depakote for seizure prophylaxis. His level is now at 76. He denies HI/AH/VH. He would like to get into a shelter, get into an ACT team, and be restarted on medications. He agrees to continue on regimen of depakote with addition of seroquel at bedtime. Pt requests for as needed medication for anxiety, and he agreed to attempt trial of smaller dose of seroquel as needed during daytime for anxiety.  Continued Clinical Symptoms:  Alcohol Use Disorder Identification Test Final Score (AUDIT): 22 The "Alcohol Use Disorders Identification Test", Guidelines for Use in Primary Care, Second Edition.  World Science writerHealth Organization [facility](WHO). Score between 0-7:  no or low risk or alcohol related problems. Score between 8-15:  moderate risk of alcohol related problems. Score between 16-19:  high risk of alcohol related problems. Score 20 or above:  warrants further diagnostic evaluation for alcohol dependence and treatment.   CLINICAL FACTORS:   Severe Anxiety and/or Agitation Depression:   Comorbid alcohol abuse/dependence More than one psychiatric diagnosis Currently Psychotic Unstable or Poor Therapeutic Relationship Previous Psychiatric Diagnoses and Treatments   Musculoskeletal: Strength & Muscle Tone: within normal limits Gait & Station: normal Patient leans: N/A  Psychiatric Specialty Exam: Physical Exam  Nursing note and vitals reviewed.   Review of Systems  Constitutional: Negative for chills and fever.  Respiratory: Negative for cough and shortness of breath.   Cardiovascular: Negative for chest pain.  Gastrointestinal: Negative for heartburn and nausea.  Psychiatric/Behavioral: Positive for depression and suicidal ideas. Negative for hallucinations. The patient is nervous/anxious.     Blood pressure 104/64, pulse 72, temperature 97.8 F (36.6 C), temperature source Oral, resp. rate 16, height  5\' 9"  (1.753 m), weight 83.9 kg (185 lb).Body mass index is 27.32 kg/m.  General Appearance:  Casual and Fairly Groomed  Eye Contact:  Good  Speech:  Clear and Coherent and Normal Rate  Volume:  Normal  Mood:  Anxious and Depressed  Affect:  Appropriate, Congruent and Constricted  Thought Process:  Coherent and Goal Directed  Orientation:  Full (Time, Place, and Person)  Thought Content:  Logical  Suicidal Thoughts:  Yes.  without intent/plan  Homicidal Thoughts:  No  Memory:  Immediate;   Good Recent;   Good Remote;   Good  Judgement:  Impaired  Insight:  Lacking  Psychomotor Activity:  Normal  Concentration:  Concentration: Good  Recall:  Fair  Fund of Knowledge:  Good  Language:  Good  Akathisia:  No  Handed:    AIMS (if indicated):     Assets:  Communication Skills Desire for Improvement Leisure Time Physical Health  ADL's:  Intact  Cognition:  WNL  Sleep:  Number of Hours: 6      COGNITIVE FEATURES THAT CONTRIBUTE TO RISK:  None    SUICIDE RISK:   Moderate:  Frequent suicidal ideation with limited intensity, and duration, some specificity in terms of plans, no associated intent, good self-control, limited dysphoria/symptomatology, some risk factors present, and identifiable protective factors, including available and accessible social support.  PLAN OF CARE:  - Admit to inpatient level of care - MDD  - Continue depakote 500mg  BID  - Start seroquel 100mg  qhs - Anxiety  - Start seroquel 25mg  q6h prn anxiety - Encourage participation in groups and the therapeutic milieu -Discharge planning will be ongoing  I certify that inpatient services furnished can reasonably be expected to improve the patient's condition.   Micheal Likenshristopher T Jaki Hammerschmidt, MD 10/28/2017, 5:39 PM

## 2017-10-28 NOTE — [facility] Counselor (Signed)
LCSW Group Therapy Note  10/28/2017 1:15pm  Type of Therapy/Topic:  Group Therapy:  Feelings about Diagnosis  Participation Level:  Active   Description of Group:   This group will allow patients to explore their thoughts and feelings about diagnoses they have received. Patients will be guided to explore their level of understanding and acceptance of these diagnoses. Facilitator will encourage patients to process their thoughts and feelings about the reactions of others to their diagnosis and will guide patients in identifying ways to discuss their diagnosis with significant others in their lives. This group will be process-oriented, with patients participating in exploration of their own experiences, giving and receiving support, and processing challenge from other group members.   Therapeutic Goals: 1. Patient will demonstrate understanding of diagnosis as evidenced by identifying two or more symptoms of the disorder 2. Patient will be able to express two feelings regarding the diagnosis 3. Patient will demonstrate their ability to communicate their needs through discussion and/or role play  Summary of Patient Progress:  Patient was attentive and engaged during today's processing group. He did not actively participate in group discussion but listened as others shared. Patient appears somewhat anxious in the group setting. He stayed for group's entirety. PT continues to make progress in the group setting with improving insight.    Therapeutic Modalities:   Cognitive Behavioral Therapy Brief Therapy Feelings Identification    Ledell PeoplesHeather N Smart, LCSW 10/28/2017 3:34 PM

## 2017-10-28 NOTE — Progress Notes (Signed)
Pt reports he is new to the unit this afternoon.  He states he was in the ED for a "about a week" after an overdose of his medications.  He says he is still having passive suicidal thoughts d/t his life situation, but can contract for safety at this time.  He denies HI/AVH.  He denies any withdrawal symptoms.  This evening he is concerned about getting the correct medications that were ordered at the ED.  Writer spoke with the evening provider who talked with patient and made additions to the orders the pt already had.  Pt voiced agreement with the medications and received prns for sleep.  Pt has been pleasant and cooperative.  Support and encouragement offered.  Pt encouraged to make his needs known to staff.  Discharge plans are in process.  Safety maintained with q15 minute checks.
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
## 2017-10-29 DIAGNOSIS — Z590 Homelessness: Secondary | ICD-10-CM

## 2017-10-29 DIAGNOSIS — T1491XA Suicide attempt, initial encounter: Secondary | ICD-10-CM

## 2017-10-29 DIAGNOSIS — F1994 Other psychoactive substance use, unspecified with psychoactive substance-induced mood disorder: Secondary | ICD-10-CM

## 2017-10-29 DIAGNOSIS — T43592A Poisoning by other antipsychotics and neuroleptics, intentional self-harm, initial encounter: Secondary | ICD-10-CM

## 2017-10-29 DIAGNOSIS — T426X2A Poisoning by other antiepileptic and sedative-hypnotic drugs, intentional self-harm, initial encounter: Secondary | ICD-10-CM

## 2017-10-29 DIAGNOSIS — Z560 Unemployment, unspecified: Secondary | ICD-10-CM

## 2017-10-29 DIAGNOSIS — T424X2A Poisoning by benzodiazepines, intentional self-harm, initial encounter: Secondary | ICD-10-CM

## 2017-10-29 LAB — PROLACTIN: PROLACTIN: 8.3 ng/mL (ref 4.0–15.2)

## 2017-10-29 NOTE — Progress Notes (Signed)
D   Pt is sad and depressed   He has minimal interaction with others and keeps mostly to himself   He is polite and cooperative  A    Verbal support given   Medications administered and effectiveness monitored    Q 15 min checks  R   Pt is safe at present time

## 2017-10-29 NOTE — Progress Notes (Signed)
Patient attended group and said that his day was a 4.  He was excited because he had a friend who visited  him today

## 2017-10-29 NOTE — Progress Notes (Signed)
Pt report he had a difficult day today.  He said that he has struggled with anxiety all day.  He states that he asked for something for anxiety, but it was a little while after he asked that he received meds. He says he still has fleeting thoughts of suicide, but can contract for safety.  He is homeless and hopes to find a bed at a shelter before [facility].  He denies HI/AVH.  He denies having any withdrawal symptoms.  Support and encouragement offered.  Meds given as ordered.  Discharge plans are in process.  Safety maintained with q15 minute checks.

## 2017-10-29 NOTE — Progress Notes (Signed)
[facility] MD Progress Note  10/29/2017 4:16 PM Nathaniel Warren  MRN:  [id number] Subjective:   51 y.o Caucasian male, divorced, homeless, unemployed.  Background history of SUD and mood disorder . Presented to the ER via emergency services. Overdosed on thirty pills of Seroquel, gabapentin and hydroxyzine. Was intoxicated with alcohol. BAL304 mg/dl.  UDS was positive for benzodiazepines. Expressed hopelessness, worthlessness and death wish at presentation. Patient was managed medically and then transferred to our unit.   Chart reviewed today. Patient discussed at team today.  Staff reports that he continues to express worthlessness. Overwhelmed by is psychosocial stressors. He has not been observed to be internally stimulated.   Seen today. Patient continues to express a lot of hopelessness. Says he has a lot of medical issues, he is homeless. He does not have a car and he does not have a job. Says he does not have any source of support. Feels there is nothing there for him. Says he was sober for two years. He drank so he would be able to take his life. No abnormal perception. Patient denies any intent to harm self here. Says he would be back to same position once he is out of the hospital.    Principal Problem: MDD (major depressive disorder), recurrent severe, without psychosis (HCC) Diagnosis:   Patient Active Problem List   Diagnosis Date Noted  . Multiple lacunar infarcts [I63.81] 10/21/2017  . Drug overdose, intentional, initial encounter (HCC) [T50.902A] 10/20/2017  . Drug overdose, intentional self-harm, initial encounter (HCC) [T50.902A] 10/20/2017  . Seizures (HCC) [R56.9] 10/20/2017  . Drug overdose [T50.901A] 10/20/2017  . Alcohol dependence (HCC) [F10.20] 06/19/2013    Class: Acute  . MDD (major depressive disorder), recurrent severe, without psychosis (HCC) [F33.2] 04/10/2013    Class: Acute  . GAD (generalized anxiety disorder) [F41.1] 04/10/2013  . Depression [F32.9] 01/11/2013   . Anxiety [F41.9] 01/11/2013   Total Time spent with patient: 20 minutes  Past Psychiatric History: As in H&P  Past Medical History:  Past Medical History:  Diagnosis Date  . Bipolar 1 disorder (HCC)   . Depression   . Seizures (HCC)   . Stroke [facility])     Past Surgical History:  Procedure Laterality Date  . ANKLE SURGERY    . SPLENECTOMY     Family History:  Family History  Problem Relation Age of Onset  . Hypertension Other    Family Psychiatric  History: As in H&P Social History:  Social History   Substance and Sexual Activity  Alcohol Use Yes   Comment: was drinking most days before being hospitalized     Social History   Substance and Sexual Activity  Drug Use Yes  . Types: Benzodiazepines   Comment: teenager used THC, only one occasion    Social History   Socioeconomic History  . Marital status: Married    Spouse name: None  . Number of children: None  . Years of education: None  . Highest education level: None  Social Needs  . Financial resource strain: None  . Food insecurity - worry: None  . Food insecurity - inability: None  . Transportation needs - medical: None  . Transportation needs - non-medical: None  Occupational History  . None  Tobacco Use  . Smoking status: Current Every Day Smoker    Packs/day: 1.00    Years: 30.00    Pack years: 30.00    Types: Cigarettes  . Smokeless tobacco: Never Used  Substance and Sexual Activity  .  Alcohol use: Yes    Comment: was drinking most days before being hospitalized  . Drug use: Yes    Types: Benzodiazepines    Comment: teenager used THC, only one occasion  . Sexual activity: Yes    Comment:  wife had hysterectomy  Other Topics Concern  . None  Social History Narrative  . None   Additional Social History:                         Sleep: Fair  Appetite:  Fair  Current Medications: Current Facility-Administered Medications  Medication Dose Route Frequency Provider Last Rate  Last Dose  . acetaminophen (TYLENOL) tablet 650 mg  650 mg Oral Q6H PRN Okonkwo, Justina A, NP      . alum & mag hydroxide-simeth (MAALOX/MYLANTA) 200-200-20 MG/5ML suspension 30 mL  30 mL Oral Q4H PRN Okonkwo, Justina A, NP      . divalproex (DEPAKOTE) DR tablet 500 mg  500 mg Oral Q12H Oneta Rack, NP   500 mg at 10/29/17 0819  . hydrOXYzine (ATARAX/VISTARIL) tablet 50 mg  50 mg Oral TID PRN Micheal Likens, MD   50 mg at 10/29/17 1610  . LORazepam (ATIVAN) tablet 1 mg  1 mg Oral Q6H PRN Oneta Rack, NP   1 mg at 10/28/17 1624  . magnesium hydroxide (MILK OF MAGNESIA) suspension 30 mL  30 mL Oral Daily PRN Okonkwo, Justina A, NP      . nicotine (NICODERM CQ - dosed in mg/24 hours) patch 21 mg  21 mg Transdermal Daily Nakeem Murnane, Delight Ovens, MD   21 mg at 10/29/17 0819  . QUEtiapine (SEROQUEL) tablet 100 mg  100 mg Oral QHS Armandina Stammer I, NP   100 mg at 10/28/17 2204  . QUEtiapine (SEROQUEL) tablet 25 mg  25 mg Oral Q6H PRN Micheal Likens, MD   25 mg at 10/29/17 0819  . traZODone (DESYREL) tablet 50 mg  50 mg Oral QHS PRN Beryle Lathe, Justina A, NP   50 mg at 10/28/17 2204    Lab Results:  Results for orders placed or performed during the hospital encounter of 10/27/17 (from the past 48 hour(s))  Valproic acid level     Status: None   Collection Time: 10/28/17  6:51 AM  Result Value Ref Range   Valproic Acid Lvl 76 50.0 - 100.0 ug/mL    Comment: Performed at [facility], [address] [place], [place] [address]  TSH     Status: None   Collection Time: 10/28/17  6:51 AM  Result Value Ref Range   TSH 3.734 0.350 - 4.500 uIU/mL    Comment: Performed by a 3rd Generation assay with a functional sensitivity of <=0.01 uIU/mL. Performed at [facility], [address], [place], [place] [address]   Lipid panel     Status: Abnormal   Collection Time: 10/28/17  6:51 AM  Result Value Ref Range   Cholesterol 194 0 - 200 mg/dL    Triglycerides 098 (H) <150 mg/dL   HDL 47 >11 mg/dL   Total CHOL/HDL Ratio 4.1 RATIO   VLDL 32 0 - 40 mg/dL   LDL Cholesterol 914 (H) 0 - 99 mg/dL    Comment:        Total Cholesterol/HDL:CHD Risk Coronary Heart Disease Risk Table                     Men  Women  1/2 Average Risk   3.4   3.3  Average Risk       5.0   4.4  2 X Average Risk   9.6   7.1  3 X Average Risk  23.4   11.0        Use the calculated Patient Ratio above and the CHD Risk Table to determine the patient's CHD Risk.        ATP III CLASSIFICATION (LDL):  <100     mg/dL   Optimal  [id number]  mg/dL   Near or Above                    Optimal  130-159  mg/dL   Borderline  [id number]  mg/dL   High  >811>190     mg/dL   Very High Performed at [facility], [address] [place], [place] [address]   Hemoglobin A1c     Status: None   Collection Time: 10/28/17  6:51 AM  Result Value Ref Range   Hgb A1c MFr Bld 5.3 4.8 - 5.6 %    Comment: (NOTE) Pre diabetes:          5.7%-6.4% Diabetes:              >6.4% Glycemic control for   <7.0% adults with diabetes    Mean Plasma Glucose 105.41 mg/dL    Comment: Performed at [facility], [address] [place], [place] [address]  Prolactin     Status: None   Collection Time: 10/28/17  6:51 AM  Result Value Ref Range   Prolactin 8.3 4.0 - 15.2 ng/mL    Comment: (NOTE) Performed At: [facility] [address] [place], [place] [id number] Jolene SchimkeNagendra Sanjai MD ON:[id number]:[phone] Performed at [facility], [address] [place], [place] [address]     Blood Alcohol level:  Lab Results  Component Value Date   ETH 305 [facility]) 10/20/2017   ETH <10 09/18/2017    Metabolic Disorder Labs: Lab Results  Component Value Date   HGBA1C 5.3 10/28/2017   MPG 105.41 10/28/2017   Lab Results  Component Value Date   PROLACTIN 8.3 10/28/2017   Lab Results  Component Value Date   CHOL 194 10/28/2017   TRIG 159 (H) 10/28/2017   HDL 47 10/28/2017    CHOLHDL 4.1 10/28/2017   VLDL 32 10/28/2017   LDLCALC 115 (H) 10/28/2017   LDLCALC  08/20/2009    92        Total Cholesterol/HDL:CHD Risk Coronary Heart Disease Risk Table                     Men   Women  1/2 Average Risk   3.4   3.3  Average Risk       5.0   4.4  2 X Average Risk   9.6   7.1  3 X Average Risk  23.4   11.0        Use the calculated Patient Ratio above and the CHD Risk Table to determine the patient's CHD Risk.        ATP III CLASSIFICATION (LDL):  <100     mg/dL   Optimal  [id number]  mg/dL   Near or Above                    Optimal  130-159  mg/dL   Borderline  [id number]  mg/dL   High  >  190     mg/dL   Very High    Physical Findings: AIMS: Facial and Oral Movements Muscles of Facial Expression: None, normal Lips and Perioral Area: None, normal Jaw: None, normal Tongue: None, normal,Extremity Movements Upper (arms, wrists, hands, fingers): None, normal Lower (legs, knees, ankles, toes): None, normal, Trunk Movements Neck, shoulders, hips: None, normal, Overall Severity Severity of abnormal movements (highest score from questions above): None, normal Incapacitation due to abnormal movements: None, normal Patient's awareness of abnormal movements (rate only patient's report): No Awareness, Dental Status Current problems with teeth and/or dentures?: No Does patient usually wear dentures?: No  CIWA:  CIWA-Ar Total: 0 COWS:     Musculoskeletal: Strength & Muscle Tone: within normal limits Gait & Station: normal Patient leans: N/A  Psychiatric Specialty Exam: Physical Exam  Constitutional: He appears well-developed and well-nourished.  HENT:  Head: Normocephalic and atraumatic.  Respiratory: Effort normal.  Neurological: He is alert.  Psychiatric:  As above    ROS  Blood pressure 103/71, pulse 80, temperature 98.2 F (36.8 C), temperature source Oral, resp. rate 16, height 5\' 9"  (1.753 m), weight 83.9 kg (185 lb).Body mass index is 27.32 kg/m.   General Appearance: Casually dressed, was watching TV. Moderate engagement.   Eye Contact:  Fair  Speech:  Clear and Coherent and Normal Rate  Volume:  Normal  Mood:  Depressed  Affect:  Congruent  Thought Process:  Linear  Orientation:  Full (Time, Place, and Person)  Thought Content:  Rumination  Suicidal Thoughts:  None currently  Homicidal Thoughts:  No  Memory:  Immediate;   Good Recent;   Good Remote;   Good  Judgement:  Fair  Insight:  Good  Psychomotor Activity:  Decreased  Concentration:  Concentration: Fair and Attention Span: Fair  Recall:  Good  Fund of Knowledge:  Good  Language:  Good  Akathisia:  Negative  Handed:    AIMS (if indicated):     Assets:  Communication Skills  ADL's:  Intact  Cognition:  WNL  Sleep:  Number of Hours: 6.75     Treatment Plan Summary: Patient is severely depressed. He is overwhelmed by his psychosocial circumstances and health issues. He remains at risk of suicide. We plan to explore locally available resources. Would continue medications at current dose.   Georgiann CockerVincent A Erik Burkett, MD 10/29/2017, 4:16 PM

## 2017-10-29 NOTE — Progress Notes (Signed)
D. Pt presents with depressed mood, affect congruent. Guy SandiferJames E. Reports he continues to feel depressed, overwhelmed with his circumstances. He states '' I have no job, no housing and really no plan of what I'm going to do, and so yeah, I've spent a few days here and a week in the hospital before that, but when I leave here that will still be the same. I'm still facing the same stuff when I get out and that is what is overwhelming me . '' we talked at length about his social stressors. He has discussed his concerns with social work and feels that he needs more stability in order to maintain his safety when discharged. He also reports that he remains very depressed, still endorses suicidal thoughts, but is able to contract for safety and agrees to let staff know if he has any thoughts of self harm. He completed his self inventory and rated his depression and anxiety at 8/10 on scale, 10 being worst. He rated his hopelessness at 9/10 on scale, 10 being worst. He also reported that he would like to be started on neurontin and an antidepressant, that that medication has been helpful for him in the past. A. Medications given. We discussed his medications at length as he reported his goal was to check and see all meds on board, and to understand more about his meds. Education provided. R. Pt remains safe on the unit. He is in no acute distress and med requests to be discussed with treatment team. Pt is safe at this time. Will continue current POC and q 15 minute checks for safety.

## 2017-10-30 MED ORDER — GABAPENTIN 300 MG PO CAPS
300.0000 mg | ORAL_CAPSULE | Freq: Three times a day (TID) | ORAL | Status: DC
  Administered 2017-10-30 – 2017-11-07 (×25): 300 mg via ORAL
  Filled 2017-10-30 (×5): qty 1
  Filled 2017-10-30: qty 21
  Filled 2017-10-30: qty 1
  Filled 2017-10-30 (×2): qty 21
  Filled 2017-10-30 (×3): qty 1
  Filled 2017-10-30: qty 21
  Filled 2017-10-30 (×2): qty 1
  Filled 2017-10-30: qty 21
  Filled 2017-10-30 (×5): qty 1
  Filled 2017-10-30: qty 21
  Filled 2017-10-30 (×14): qty 1

## 2017-10-30 NOTE — Progress Notes (Signed)
Patient ID: Nathaniel SpatesJames T Warren, male   DOB: 02/06/1966, 51 y.o.   MRN: [id number]   D: Pt has been very flat and depressed on the unit, he has also been very agitated. Pt reported that his main issue was that he was in pain and needed his Neurontin back. Dr. Jackquline BerlinIzediuno made aware, orders noted for Neurontin. Pt reported that his depression was a 8, his hopelessness was a 9, and his anxiety was a 9. Pt reported being positive SI, but could contract for safety. Pt reported that his goal for today was to see the SW. Pt reported being negative HI, no AH/VH noted. A: 15 min checks continued for patient safety. R: Pt safety maintained.

## 2017-10-30 NOTE — Progress Notes (Signed)
[facility]  10/30/2017 1:43 PM Nathaniel Warren  MRN:  [id number] Subjective:   51 y.o Caucasian male, divorced, homeless, unemployed.  Background history of SUD and mood disorder . Presented to the ER via emergency services. Overdosed on thirty pills of Seroquel, gabapentin and hydroxyzine. Was intoxicated with alcohol. BAL304 mg/dl.  UDS was positive for benzodiazepines. Expressed hopelessness, worthlessness and death wish at presentation. Patient was managed medically and then transferred to our unit.   Chart reviewed today. Patient discussed at team today.  Staff reports that he continues to express worthlessness. Overwhelmed by is psychosocial stressors. He has not been observed to be internally stimulated. He watches TV most of the time.   Seen today. Says nothing has changed. He still feels hopeless and worthless. Says he is able to get into sleep with medications. He wakes later and struggles to sleep afterwards. Lots of negative ruminations at night. Says he likes to watch TV as a means of distracting himself. No intent to hurt self in here.  No evidence of psychosis.   Principal Problem: MDD (major depressive disorder), recurrent severe, without psychosis (HCC) Diagnosis:   Patient Active Problem List   Diagnosis Date Noted  . Multiple lacunar infarcts [I63.81] 10/21/2017  . Drug overdose, intentional, initial encounter (HCC) [T50.902A] 10/20/2017  . Drug overdose, intentional self-harm, initial encounter (HCC) [T50.902A] 10/20/2017  . Seizures (HCC) [R56.9] 10/20/2017  . Drug overdose [T50.901A] 10/20/2017  . Alcohol dependence (HCC) [F10.20] 06/19/2013    Class: Acute  . MDD (major depressive disorder), recurrent severe, without psychosis (HCC) [F33.2] 04/10/2013    Class: Acute  . GAD (generalized anxiety disorder) [F41.1] 04/10/2013  . Depression [F32.9] 01/11/2013  . Anxiety [F41.9] 01/11/2013   Total Time spent with patient: 20 minutes  Past Psychiatric  History: As in H&P  Past Medical History:  Past Medical History:  Diagnosis Date  . Bipolar 1 disorder (HCC)   . Depression   . Seizures (HCC)   . Stroke [facility](HCC)     Past Surgical History:  Procedure Laterality Date  . ANKLE SURGERY    . SPLENECTOMY     Family History:  Family History  Problem Relation Age of Onset  . Hypertension Other    Family Psychiatric  History: As in H&P Social History:  Social History   Substance and Sexual Activity  Alcohol Use Yes   Comment: was drinking most days before being hospitalized     Social History   Substance and Sexual Activity  Drug Use Yes  . Types: Benzodiazepines   Comment: teenager used THC, only one occasion    Social History   Socioeconomic History  . Marital status: Married    Spouse name: None  . Number of children: None  . Years of education: None  . Highest education level: None  Social Needs  . Financial resource strain: None  . Food insecurity - worry: None  . Food insecurity - inability: None  . Transportation needs - medical: None  . Transportation needs - non-medical: None  Occupational History  . None  Tobacco Use  . Smoking status: Current Every Day Smoker    Packs/day: 1.00    Years: 30.00    Pack years: 30.00    Types: Cigarettes  . Smokeless tobacco: Never Used  Substance and Sexual Activity  . Alcohol use: Yes    Comment: was drinking most days before being hospitalized  . Drug use: Yes    Types: Benzodiazepines    Comment:  teenager used THC, only one occasion  . Sexual activity: Yes    Comment:  wife had hysterectomy  Other Topics Concern  . None  Social History Narrative  . None   Additional Social History:                         Sleep: Fair  Appetite:  Fair  Current Medications: Current Facility-Administered Medications  Medication Dose Route Frequency Provider Last Rate Last Dose  . acetaminophen (TYLENOL) tablet 650 mg  650 mg Oral Q6H PRN Okonkwo, Justina A, NP    650 mg at 10/30/17 1025  . alum & mag hydroxide-simeth (MAALOX/MYLANTA) 200-200-20 MG/5ML suspension 30 mL  30 mL Oral Q4H PRN Okonkwo, Justina A, NP      . divalproex (DEPAKOTE) DR tablet 500 mg  500 mg Oral Q12H Oneta RackLewis, Tanika N, NP   500 mg at 10/30/17 1025  . hydrOXYzine (ATARAX/VISTARIL) tablet 50 mg  50 mg Oral TID PRN Micheal Likensainville, Christopher T, MD   50 mg at 10/30/17 1025  . LORazepam (ATIVAN) tablet 1 mg  1 mg Oral Q6H PRN Oneta RackLewis, Tanika N, NP   1 mg at 10/30/17 1025  . magnesium hydroxide (MILK OF MAGNESIA) suspension 30 mL  30 mL Oral Daily PRN Okonkwo, Justina A, NP      . nicotine (NICODERM CQ - dosed in mg/24 hours) patch 21 mg  21 mg Transdermal Daily Dezra Mandella, Delight OvensVincent A, MD   21 mg at 10/30/17 1024  . QUEtiapine (SEROQUEL) tablet 100 mg  100 mg Oral QHS Nwoko, Agnes I, NP   100 mg at 10/29/17 2240  . QUEtiapine (SEROQUEL) tablet 25 mg  25 mg Oral Q6H PRN Micheal Likensainville, Christopher T, MD   25 mg at 10/29/17 0819  . traZODone (DESYREL) tablet 50 mg  50 mg Oral QHS PRN Okonkwo, Justina A, NP   50 mg at 10/29/17 2239    Lab Results:  No results found for this or any previous visit (from the past 48 hour(s)).  Blood Alcohol level:  Lab Results  Component Value Date   ETH 305 [facility](HH) 10/20/2017   ETH <10 09/18/2017    Metabolic Disorder Labs: Lab Results  Component Value Date   HGBA1C 5.3 10/28/2017   MPG 105.41 10/28/2017   Lab Results  Component Value Date   PROLACTIN 8.3 10/28/2017   Lab Results  Component Value Date   CHOL 194 10/28/2017   TRIG 159 (H) 10/28/2017   HDL 47 10/28/2017   CHOLHDL 4.1 10/28/2017   VLDL 32 10/28/2017   LDLCALC 115 (H) 10/28/2017   LDLCALC  08/20/2009    92        Total Cholesterol/HDL:CHD Risk Coronary Heart Disease Risk Table                     Men   Women  1/2 Average Risk   3.4   3.3  Average Risk       5.0   4.4  2 X Average Risk   9.6   7.1  3 X Average Risk  23.4   11.0        Use the calculated Patient Ratio above and the CHD  Risk Table to determine the patient's CHD Risk.        ATP III CLASSIFICATION (LDL):  <100     mg/dL   Optimal  244-010100-129  mg/dL   Near or Above  Optimal  130-159  mg/dL   Borderline  161-096  mg/dL   High  >045     mg/dL   Very High    Physical Findings: AIMS: Facial and Oral Movements Muscles of Facial Expression: None, normal Lips and Perioral Area: None, normal Jaw: None, normal Tongue: None, normal,Extremity Movements Upper (arms, wrists, hands, fingers): None, normal Lower (legs, knees, ankles, toes): None, normal, Trunk Movements Neck, shoulders, hips: None, normal, Overall Severity Severity of abnormal movements (highest score from questions above): None, normal Incapacitation due to abnormal movements: None, normal Patient's awareness of abnormal movements (rate only patient's report): No Awareness, Dental Status Current problems with teeth and/or dentures?: No Does patient usually wear dentures?: No  CIWA:  CIWA-Ar Total: 0 COWS:     Musculoskeletal: Strength & Muscle Tone: within normal limits Gait & Station: normal Patient leans: N/A  Psychiatric Specialty Exam: Physical Exam  Constitutional: He appears well-developed and well-nourished.  HENT:  Head: Normocephalic and atraumatic.  Respiratory: Effort normal.  Neurological: He is alert.  Psychiatric:  As above    ROS  Blood pressure 95/67, pulse 75, temperature 98 F (36.7 C), temperature source Oral, resp. rate 18, height 5\' 9"  (1.753 m), weight 83.9 kg (185 lb).Body mass index is 27.32 kg/m.  General Appearance: Casually dressed, was watching TV just befor interview. Moderate engagement.   Eye Contact:  Fair  Speech:  Clear and Coherent and Normal Rate  Volume:  Normal  Mood:  Depressed  Affect:  Congruent  Thought Process:  Linear  Orientation:  Full (Time, Place, and Person)  Thought Content:  Rumination  Suicidal Thoughts:  None currently  Homicidal Thoughts:  No  Memory:   Immediate;   Good Recent;   Good Remote;   Good  Judgement:  Fair  Insight:  Good  Psychomotor Activity:  Decreased  Concentration:  Concentration: Fair and Attention Span: Fair  Recall:  Good  Fund of Knowledge:  Good  Language:  Good  Akathisia:  Negative  Handed:    AIMS (if indicated):     Assets:  Communication Skills  ADL's:  Intact  Cognition:  WNL  Sleep:  Number of Hours: 6.75     Treatment Plan Summary: Patient is severely depressed. He is overwhelmed by his psychosocial circumstances and health issues. He remains at risk of suicide. We plan to explore locally available resources. Would continue medications at current dose.   Georgiann Cocker, MD 10/30/2017, 1:43 PMPatient ID: Nathaniel Warren, male   DOB: May 30, 1966, 51 y.o.   MRN: [id number]

## 2017-10-30 NOTE — [facility] Group Notes (Signed)
LCSW Group Therapy Note  10/30/2017 1:15pm  Type of Therapy and Topic:  Group Therapy:  Feelings around Relapse and Recovery  Participation Level:  Active   Description of Group:    Patients in this group will discuss emotions they experience before and after a relapse. They will process how experiencing these feelings, or avoidance of experiencing them, relates to having a relapse. Facilitator will guide patients to explore emotions they have related to recovery. Patients will be encouraged to process which emotions are more powerful. They will be guided to discuss the emotional reaction significant others in their lives may have to their relapse or recovery. Patients will be assisted in exploring ways to respond to the emotions of others without this contributing to a relapse.  Therapeutic Goals: 1. Patient will identify two or more emotions that lead to a relapse for them 2. Patient will identify two emotions that result when they relapse 3. Patient will identify two emotions related to recovery 4. Patient will demonstrate ability to communicate their needs through discussion and/or role plays   Summary of Patient Progress:  Patient participated in group discussion of Post Acute Withdrawal Syndrome (PAWS), reviewed signs and symptoms, processed ways to care for self and avoid relapse.     Therapeutic Modalities:   Cognitive Behavioral Therapy Solution-Focused Therapy Assertiveness Training Relapse Prevention Therapy   Eugene Isadore C Kairy Folsom, LCSW 10/30/2017 5:10 PM   

## 2017-10-30 NOTE — Progress Notes (Signed)
Patient did attend the evening speaker AA meeting.  

## 2017-10-31 NOTE — Progress Notes (Signed)
D Patient is handling his detox fairly well. HE is seen [facility] on the [facility] hall this morning. He takes his scheduled meds as planned and he asks what he should do, " my ffet are cracked and bleeding on the bottoms.Marland Kitchen.  skin is real dry". Pt given vaseline and patient socks for this. A He completed his daily assessment and on this he wrote he has experienced SI today but he contracts with this writer to not act on his SI. HE rated his depression, hopelessness and anxeity " 8/9/9", respectively. R Safety is in place and poc cotn.

## 2017-10-31 NOTE — [facility] Group Notes (Signed)
[facility] LCSW Group Therapy Note  Date/Time:    10/31/2017 10:00-11:00AM  Type of Therapy and Topic:  Group Therapy:  Healthy vs Unhealthy Coping Skills  Participation Level:  Minimal   Description of Group:  The focus of this group was to determine what unhealthy coping techniques typically are used by group members and what healthy coping techniques would be helpful in coping with various problems.  In particular we addressed the unhealthy coping techniques typically used on a rainy, gloomy day like today, and what would be a healthier coping method to use. Patients were guided in becoming aware of the differences between healthy and unhealthy coping techniques.   Patients were encouraged to consider the changes they can make in their lives including taking medication, telling their doctor when their medicine is not working, introducing new hobbies, creating a schedule and many more.  Therapeutic Goals 1. Patients learned that coping is what human beings do all day long to deal with various situations in their lives 2. Patients defined and discussed healthy vs unhealthy coping techniques 3. Patients identified their preferred coping techniques and identified whether these were healthy or unhealthy 4. Patients determined 1-2 healthy coping skills they would like to become more familiar with and use more often 5. Patients provided support and ideas to each other  Summary of Patient Progress: During group, patient expressed he finds days like today depressing, and he did not talk at all during group.  He was asked at the conclusion of group if he was okay, and stated that he is simply very depressed.   Therapeutic Modalities Cognitive Behavioral Therapy Motivational Interviewing   Ambrose MantleMareida Grossman-Orr, LCSW 10/31/2017, 12:08 PM

## 2017-10-31 NOTE — Progress Notes (Signed)
[facility]  10/31/2017 12:13 PM Nathaniel SpatesJames T Winer  MRN:  [id number] Subjective:   51 y.o Caucasian male, divorced, homeless, unemployed.  Background history of SUD and mood disorder . Presented to the ER via emergency services. Overdosed on thirty pills of Seroquel, gabapentin and hydroxyzine. Was intoxicated with alcohol. BAL304 mg/dl.  UDS was positive for benzodiazepines. Expressed hopelessness, worthlessness and death wish at presentation. Patient was managed medically and then transferred to our unit.   Chart reviewed today. Patient discussed at team today.  Patient seen and assessed today with MD. During the evaluation he is noted to be alert and oriented, calm and cooperative. He responds appropriately to writer, however continues to note ongoing apathy, hopelessness, and increasing anxiety. He rates his anxiety 8-9/10, with 10 being the worse. He contributes his anxiety to thoughts of lunch time , and relationship with agoraphobia. He denies any cravings, urges, withdrawal symptoms at this time. He reports his goal today is to work on triggers for anxiety, at this time he is unable to state what triggers his anxiety. He is attending groups, and reports active participation in group at this time. He continues to isolate despite encouragement to interact with milieu during free time.  He reports some intermittent passive SI and is able to contract for her safety at this time. He denies homicidal ideations and auditory hallucinations.    Principal Problem: MDD (major depressive disorder), recurrent severe, without psychosis (HCC) Diagnosis:   Patient Active Problem List   Diagnosis Date Noted  . Multiple lacunar infarcts [I63.81] 10/21/2017  . Drug overdose, intentional, initial encounter (HCC) [T50.902A] 10/20/2017  . Drug overdose, intentional self-harm, initial encounter (HCC) [T50.902A] 10/20/2017  . Seizures (HCC) [R56.9] 10/20/2017  . Drug overdose [T50.901A] 10/20/2017  .  Alcohol dependence (HCC) [F10.20] 06/19/2013    Class: Acute  . MDD (major depressive disorder), recurrent severe, without psychosis (HCC) [F33.2] 04/10/2013    Class: Acute  . GAD (generalized anxiety disorder) [F41.1] 04/10/2013  . Depression [F32.9] 01/11/2013  . Anxiety [F41.9] 01/11/2013   Total Time spent with patient: 20 minutes  Past Psychiatric History: As in H&P  Past Medical History:  Past Medical History:  Diagnosis Date  . Bipolar 1 disorder (HCC)   . Depression   . Seizures (HCC)   . Stroke [facility](HCC)     Past Surgical History:  Procedure Laterality Date  . ANKLE SURGERY    . SPLENECTOMY     Family History:  Family History  Problem Relation Age of Onset  . Hypertension Other    Family Psychiatric  History: As in H&P Social History:  Social History   Substance and Sexual Activity  Alcohol Use Yes   Comment: was drinking most days before being hospitalized     Social History   Substance and Sexual Activity  Drug Use Yes  . Types: Benzodiazepines   Comment: teenager used THC, only one occasion    Social History   Socioeconomic History  . Marital status: Married    Spouse name: None  . Number of children: None  . Years of education: None  . Highest education level: None  Social Needs  . Financial resource strain: None  . Food insecurity - worry: None  . Food insecurity - inability: None  . Transportation needs - medical: None  . Transportation needs - non-medical: None  Occupational History  . None  Tobacco Use  . Smoking status: Current Every Day Smoker    Packs/day: 1.00  Years: 30.00    Pack years: 30.00    Types: Cigarettes  . Smokeless tobacco: Never Used  Substance and Sexual Activity  . Alcohol use: Yes    Comment: was drinking most days before being hospitalized  . Drug use: Yes    Types: Benzodiazepines    Comment: teenager used THC, only one occasion  . Sexual activity: Yes    Comment:  wife had hysterectomy  Other Topics  Concern  . None  Social History Narrative  . None   Additional Social History:                         Sleep: Fair  Appetite:  Fair  Current Medications: Current Facility-Administered Medications  Medication Dose Route Frequency Provider Last Rate Last Dose  . acetaminophen (TYLENOL) tablet 650 mg  650 mg Oral Q6H PRN Okonkwo, Justina A, NP   650 mg at 10/30/17 1025  . alum & mag hydroxide-simeth (MAALOX/MYLANTA) 200-200-20 MG/5ML suspension 30 mL  30 mL Oral Q4H PRN Okonkwo, Justina A, NP      . divalproex (DEPAKOTE) DR tablet 500 mg  500 mg Oral Q12H Oneta RackLewis, Tanika N, NP   500 mg at 10/31/17 0857  . gabapentin (NEURONTIN) capsule 300 mg  300 mg Oral TID Georgiann CockerIzediuno, Vincent A, MD   300 mg at 10/31/17 0857  . hydrOXYzine (ATARAX/VISTARIL) tablet 50 mg  50 mg Oral TID PRN Micheal Lik[place], Christopher T, MD   50 mg at 10/31/17 0859  . LORazepam (ATIVAN) tablet 1 mg  1 mg Oral Q6H PRN Oneta RackLewis, Tanika N, NP   1 mg at 10/30/17 2200  . magnesium hydroxide (MILK OF MAGNESIA) suspension 30 mL  30 mL Oral Daily PRN Okonkwo, Justina A, NP      . nicotine (NICODERM CQ - dosed in mg/24 hours) patch 21 mg  21 mg Transdermal Daily Izediuno, Delight OvensVincent A, MD   21 mg at 10/31/17 0857  . QUEtiapine (SEROQUEL) tablet 100 mg  100 mg Oral QHS Armandina StammerNwoko, Agnes I, NP   100 mg at 10/30/17 2201  . QUEtiapine (SEROQUEL) tablet 25 mg  25 mg Oral Q6H PRN Micheal Lik[place], Christopher T, MD   25 mg at 10/29/17 0819  . traZODone (DESYREL) tablet 50 mg  50 mg Oral QHS PRN Okonkwo, Justina A, NP   50 mg at 10/30/17 2200    Lab Results:  No results found for this or any previous visit (from the past 48 hour(s)).  Blood Alcohol level:  Lab Results  Component Value Date   ETH 305 [facility](HH) 10/20/2017   ETH <10 09/18/2017    Metabolic Disorder Labs: Lab Results  Component Value Date   HGBA1C 5.3 10/28/2017   MPG 105.41 10/28/2017   Lab Results  Component Value Date   PROLACTIN 8.3 10/28/2017   Lab Results  Component Value  Date   CHOL 194 10/28/2017   TRIG 159 (H) 10/28/2017   HDL 47 10/28/2017   CHOLHDL 4.1 10/28/2017   VLDL 32 10/28/2017   LDLCALC 115 (H) 10/28/2017   LDLCALC  08/20/2009    92        Total Cholesterol/HDL:CHD Risk Coronary Heart Disease Risk Table                     Men   Women  1/2 Average Risk   3.4   3.3  Average Risk       5.0   4.4  2 X  Average Risk   9.6   7.1  3 X Average Risk  23.4   11.0        Use the calculated Patient Ratio above and the CHD Risk Table to determine the patient's CHD Risk.        ATP III CLASSIFICATION (LDL):  <100     mg/dL   Optimal  960-454  mg/dL   Near or Above                    Optimal  130-159  mg/dL   Borderline  098-119  mg/dL   High  >147     mg/dL   Very High    Physical Findings: AIMS: Facial and Oral Movements Muscles of Facial Expression: None, normal Lips and Perioral Area: None, normal Jaw: None, normal Tongue: None, normal,Extremity Movements Upper (arms, wrists, hands, fingers): None, normal Lower (legs, knees, ankles, toes): None, normal, Trunk Movements Neck, shoulders, hips: None, normal, Overall Severity Severity of abnormal movements (highest score from questions above): None, normal Incapacitation due to abnormal movements: None, normal Patient's awareness of abnormal movements (rate only patient's report): No Awareness, Dental Status Current problems with teeth and/or dentures?: No Does patient usually wear dentures?: No  CIWA:  CIWA-Ar Total: 0 COWS:     Musculoskeletal: Strength & Muscle Tone: within normal limits Gait & Station: normal Patient leans: N/A  Psychiatric Specialty Exam: Physical Exam  Constitutional: He appears well-developed and well-nourished.  HENT:  Head: Normocephalic and atraumatic.  Respiratory: Effort normal.  Neurological: He is alert.  Psychiatric:  As above    ROS   Blood pressure 96/67, pulse 84, temperature 98.4 F (36.9 C), resp. rate 16, height 5\' 9"  (1.753 m), weight  83.9 kg (185 lb).Body mass index is 27.32 kg/m.  General Appearance: Casually dressed, was watching TV just befor interview. Moderate engagement.   Eye Contact:  Fair  Speech:  Clear and Coherent and Normal Rate  Volume:  Normal  Mood:  Anxious, Depressed and Hopeless  Affect:  Depressed and Flat  Thought Process:  Linear and Descriptions of Associations: Circumstantial  Orientation:  Full (Time, Place, and Person)  Thought Content:  Logical  Suicidal Thoughts:  None currently  Homicidal Thoughts:  No  Memory:  Immediate;   Fair Recent;   Fair Remote;   Fair  Judgement:  Fair  Insight:  Fair  Psychomotor Activity:  Decreased and Psychomotor Retardation  Concentration:  Concentration: Good and Attention Span: Good  Recall:  [employer] of Knowledge:  Fair  Language:  Fair  Akathisia:  No  Handed:    AIMS (if indicated):     Assets:  Communication Skills Desire for Improvement  ADL's:  Intact  Cognition:  WNL  Sleep:  Number of Hours: 5.75     Treatment Plan Summary: Patient is severely depressed. He is overwhelmed by his psychosocial circumstances and health issues. He remains at risk of suicide. We plan to explore locally available resources. Would continue medications at current dose. Labs reviewed are within normal, LDL 115, Depakote level is within normal at this time. May benefit from SSRI for depression and anxiety, suicidal ideations.   Truman Hayward, FNP 10/31/2017, 12:13 PM

## 2017-10-31 NOTE — Progress Notes (Signed)
D   Pt is sad and depressed   He has minimal interaction with others and keeps mostly to himself   He is polite and cooperative   He reports minimal improvement but appears less anxious and depressed A    Verbal support given   Medications administered and effectiveness monitored    Q 15 min checks  R   Pt is safe at present time

## 2017-11-01 DIAGNOSIS — R569 Unspecified convulsions: Secondary | ICD-10-CM

## 2017-11-01 MED ORDER — QUETIAPINE FUMARATE 50 MG PO TABS
150.0000 mg | ORAL_TABLET | Freq: Every day | ORAL | Status: DC
  Administered 2017-11-01 – 2017-11-02 (×2): 150 mg via ORAL
  Filled 2017-11-01 (×5): qty 3

## 2017-11-01 NOTE — Progress Notes (Signed)
D.  Pt pleasant on approach, denies complaints at this time.  Pt was positive for evening AA group, observed engaged in appropriate interaction with peers on the unit.  Pt does endorse passive SI with no plan and contracts for safety on the unit.  Pt denies HI/AVH at this time.  A.  Support and encouragement offered, medication given as ordered  R. Pt remains safe on the unit, will continue to monitor.

## 2017-11-01 NOTE — Progress Notes (Signed)
D Patient has been uncomfortable expressing feelings of extreme anxiety, agitation, worry, today. He has spent his entire day in his bed ( he did manage to go to his meals in the cafe"). A HE completed his daily assessment and on this he wrote he has experienced SI today but he contracts with this writer to not hurt himslef. HE rated his depression, hopelessness and anxeity " 8/9/9/",respectively. He requested prn at dinnertime, saying " Im really agitated....everybody's voices are getting on my nereves". Ativan was given per MD order and pt went to his room. And layed down. APprox 3o min later pt was observed sitting in the dayroom amongst the other patients. R Safety is in palce

## 2017-11-01 NOTE — [facility] Group Notes (Signed)
[facility] Group Notes: (Clinical Social Work)   11/01/2017      Type of Therapy:  Group Therapy   Participation Level:  Did Not Attend despite MHT prompting   Adonai Helzer Grossman-Orr, LCSW 11/01/2017, 12:34 PM     

## 2017-11-01 NOTE — Progress Notes (Signed)
[facility]  11/01/2017 10:37 AM Laurence SpatesJames T Ferber  MRN:  [id number]   Subjective:  Patient reports that he is not sleeping good through the night. He reports getting to sleep easily but waking up 2-3 hours later. He states that no one listens to him and that he just wants to feel better and to get some housing. He reports SI and always feels that way until he gets his medications the way they need to be and set up with housing a a good plan. He denies any HI/AVH and contracts for safety. He reports taht his anxiety is always high especially when having to go eat lunch with a crowd.  Objective: Patient's chart and findings reviewed and discussed with treatment team. Patient is focused on medication management and housing. Will discuss with CSW about the plans for housing and ACTT services. Patient has no insurance and may be seeking more secondary gain from th hospital. Will increase his Seroquel to 150 mg QHS.  Principal Problem: MDD (major depressive disorder), recurrent severe, without psychosis (HCC) Diagnosis:   Patient Active Problem List   Diagnosis Date Noted  . Multiple lacunar infarcts [I63.81] 10/21/2017  . Drug overdose, intentional, initial encounter (HCC) [T50.902A] 10/20/2017  . Drug overdose, intentional self-harm, initial encounter (HCC) [T50.902A] 10/20/2017  . Seizures (HCC) [R56.9] 10/20/2017  . Drug overdose [T50.901A] 10/20/2017  . Alcohol dependence (HCC) [F10.20] 06/19/2013    Class: Acute  . MDD (major depressive disorder), recurrent severe, without psychosis (HCC) [F33.2] 04/10/2013    Class: Acute  . GAD (generalized anxiety disorder) [F41.1] 04/10/2013  . Depression [F32.9] 01/11/2013  . Anxiety [F41.9] 01/11/2013   Total Time spent with patient: 25 minutes  Past Psychiatric History: See H&P  Past Medical History:  Past Medical History:  Diagnosis Date  . Bipolar 1 disorder (HCC)   . Depression   . Seizures (HCC)   . Stroke [facility](HCC)     Past  Surgical History:  Procedure Laterality Date  . ANKLE SURGERY    . SPLENECTOMY     Family History:  Family History  Problem Relation Age of Onset  . Hypertension Other    Family Psychiatric  History: See H&P Social History:  Social History   Substance and Sexual Activity  Alcohol Use Yes   Comment: was drinking most days before being hospitalized     Social History   Substance and Sexual Activity  Drug Use Yes  . Types: Benzodiazepines   Comment: teenager used THC, only one occasion    Social History   Socioeconomic History  . Marital status: Married    Spouse name: None  . Number of children: None  . Years of education: None  . Highest education level: None  Social Needs  . Financial resource strain: None  . Food insecurity - worry: None  . Food insecurity - inability: None  . Transportation needs - medical: None  . Transportation needs - non-medical: None  Occupational History  . None  Tobacco Use  . Smoking status: Current Every Day Smoker    Packs/day: 1.00    Years: 30.00    Pack years: 30.00    Types: Cigarettes  . Smokeless tobacco: Never Used  Substance and Sexual Activity  . Alcohol use: Yes    Comment: was drinking most days before being hospitalized  . Drug use: Yes    Types: Benzodiazepines    Comment: teenager used THC, only one occasion  . Sexual activity: Yes  Comment:  wife had hysterectomy  Other Topics Concern  . None  Social History Narrative  . None   Additional Social History:                         Sleep: Fair  Appetite:  Good  Current Medications: Current Facility-Administered Medications  Medication Dose Route Frequency Provider Last Rate Last Dose  . acetaminophen (TYLENOL) tablet 650 mg  650 mg Oral Q6H PRN Okonkwo, Justina A, NP   650 mg at 10/30/17 1025  . alum & mag hydroxide-simeth (MAALOX/MYLANTA) 200-200-20 MG/5ML suspension 30 mL  30 mL Oral Q4H PRN Okonkwo, Justina A, NP      . divalproex  (DEPAKOTE) DR tablet 500 mg  500 mg Oral Q12H Oneta RackLewis, Tanika N, NP   500 mg at 11/01/17 0904  . gabapentin (NEURONTIN) capsule 300 mg  300 mg Oral TID Georgiann CockerIzediuno, Vincent A, MD   300 mg at 11/01/17 0904  . hydrOXYzine (ATARAX/VISTARIL) tablet 50 mg  50 mg Oral TID PRN Micheal Likensainville, Christopher T, MD   50 mg at 11/01/17 0907  . LORazepam (ATIVAN) tablet 1 mg  1 mg Oral Q6H PRN Oneta RackLewis, Tanika N, NP   1 mg at 10/31/17 2205  . magnesium hydroxide (MILK OF MAGNESIA) suspension 30 mL  30 mL Oral Daily PRN Okonkwo, Justina A, NP      . nicotine (NICODERM CQ - dosed in mg/24 hours) patch 21 mg  21 mg Transdermal Daily Izediuno, Delight OvensVincent A, MD   21 mg at 11/01/17 0904  . QUEtiapine (SEROQUEL) tablet 150 mg  150 mg Oral QHS Hjalmar Ballengee, Gerlene Burdockravis B, FNP      . QUEtiapine (SEROQUEL) tablet 25 mg  25 mg Oral Q6H PRN Micheal Likensainville, Christopher T, MD   25 mg at 11/01/17 0907  . traZODone (DESYREL) tablet 50 mg  50 mg Oral QHS PRN Beryle Lathekonkwo, Justina A, NP   50 mg at 10/31/17 2205    Lab Results: No results found for this or any previous visit (from the past 48 hour(s)).  Blood Alcohol level:  Lab Results  Component Value Date   ETH 305 [facility](HH) 10/20/2017   ETH <10 09/18/2017    Metabolic Disorder Labs: Lab Results  Component Value Date   HGBA1C 5.3 10/28/2017   MPG 105.41 10/28/2017   Lab Results  Component Value Date   PROLACTIN 8.3 10/28/2017   Lab Results  Component Value Date   CHOL 194 10/28/2017   TRIG 159 (H) 10/28/2017   HDL 47 10/28/2017   CHOLHDL 4.1 10/28/2017   VLDL 32 10/28/2017   LDLCALC 115 (H) 10/28/2017   LDLCALC  08/20/2009    92        Total Cholesterol/HDL:CHD Risk Coronary Heart Disease Risk Table                     Men   Women  1/2 Average Risk   3.4   3.3  Average Risk       5.0   4.4  2 X Average Risk   9.6   7.1  3 X Average Risk  23.4   11.0        Use the calculated Patient Ratio above and the CHD Risk Table to determine the patient's CHD Risk.        ATP III CLASSIFICATION  (LDL):  <100     mg/dL   Optimal  161-096100-129  mg/dL   Near or Above  Optimal  130-159  mg/dL   Borderline  540-981  mg/dL   High  >191     mg/dL   Very High    Physical Findings: AIMS: Facial and Oral Movements Muscles of Facial Expression: None, normal Lips and Perioral Area: None, normal Jaw: None, normal Tongue: None, normal,Extremity Movements Upper (arms, wrists, hands, fingers): None, normal Lower (legs, knees, ankles, toes): None, normal, Trunk Movements Neck, shoulders, hips: None, normal, Overall Severity Severity of abnormal movements (highest score from questions above): None, normal Incapacitation due to abnormal movements: None, normal Patient's awareness of abnormal movements (rate only patient's report): No Awareness, Dental Status Current problems with teeth and/or dentures?: No Does patient usually wear dentures?: No  CIWA:  CIWA-Ar Total: 0 COWS:     Musculoskeletal: Strength & Muscle Tone: within normal limits Gait & Station: normal Patient leans: N/A  Psychiatric Specialty Exam: Physical Exam  Nursing note and vitals reviewed. Constitutional: He is oriented to person, place, and time. He appears well-developed and well-nourished.  Cardiovascular: Normal rate.  Respiratory: Effort normal.  Musculoskeletal: Normal range of motion.  Neurological: He is alert and oriented to person, place, and time.  Skin: Skin is warm.    Review of Systems  Constitutional: Negative.   HENT: Negative.   Eyes: Negative.   Respiratory: Negative.   Cardiovascular: Negative.   Gastrointestinal: Negative.   Genitourinary: Negative.   Musculoskeletal: Negative.   Skin: Negative.   Neurological: Negative.   Endo/Heme/Allergies: Negative.   Psychiatric/Behavioral: Positive for depression and suicidal ideas. Negative for hallucinations. The patient is nervous/anxious and has insomnia.     Blood pressure (!) 83/62, pulse 82, temperature 98 F (36.7 C),  temperature source Oral, resp. rate 18, height 5\' 9"  (1.753 m), weight 83.9 kg (185 lb).Body mass index is 27.32 kg/m.  General Appearance: Casual  Eye Contact:  Good  Speech:  Clear and Coherent and Normal Rate  Volume:  Normal  Mood:  Depressed  Affect:  Flat  Thought Process:  Goal Directed and Descriptions of Associations: Intact  Orientation:  Full (Time, Place, and Person)  Thought Content:  WDL  Suicidal Thoughts:  Yes.  without intent/plan  Homicidal Thoughts:  No  Memory:  Immediate;   Good Recent;   Good Remote;   Good  Judgement:  Fair  Insight:  Fair  Psychomotor Activity:  Normal  Concentration:  Concentration: Good and Attention Span: Good  Recall:  Good  Fund of Knowledge:  Good  Language:  Good  Akathisia:  No  Handed:  Right  AIMS (if indicated):     Assets:  Communication Skills Desire for Improvement  ADL's:  Intact  Cognition:  WNL  Sleep:  Number of Hours: 6.75   Problems Addressed: MDD severe GAD  Treatment Plan Summary: Daily contact with patient to assess and evaluate symptoms and progress in treatment, Medication management and Plan is to:   -Increase Seroquel 150 mg PO QHS for mood stability -Continue Seroquel 25 mg PO Q6H PRN for agitation -Continue Gabapentin 300 mg PO TID for agitation -Continue Depakote 500 mg PO Q12H for mood stability and seizures -Continue Trazodone 50 mg PO QHS PRN for insomnia -Continue Vistaril 50 mg PO TID PRN for anxiety -Continue Ativan 1 mg Q6H PRN for anxiety or seizures. -Encourage group therapy participation  Maryfrances Bunnell, FNP 11/01/2017, 10:37 AM

## 2017-11-01 NOTE — Progress Notes (Signed)
D.  Pt pleasant on approach, denies complaints at this time.  Pt was positive for evening AA group, minimal interaction on unit due to anxiety over disposition upon discharge.  Pt denied SI/HI/AVH at this time but did endorse passive SI on day shift.  Pt contracts for safety on the unit.  A  Support and encouragement offered, medication given as ordered  R.  PT remains safe on the unit, will continue to monitor.

## 2017-11-02 DIAGNOSIS — F1310 Sedative, hypnotic or anxiolytic abuse, uncomplicated: Secondary | ICD-10-CM

## 2017-11-02 DIAGNOSIS — F411 Generalized anxiety disorder: Secondary | ICD-10-CM

## 2017-11-02 DIAGNOSIS — R451 Restlessness and agitation: Secondary | ICD-10-CM

## 2017-11-02 DIAGNOSIS — F39 Unspecified mood [affective] disorder: Secondary | ICD-10-CM

## 2017-11-02 NOTE — Progress Notes (Signed)
[facility]  11/02/2017 2:43 PM Nathaniel SpatesJames T Learn  MRN:  [id number]  Subjective: I'm not good today because I got some bad news. I'm being blamed by my girlfriend's niece for bringing bed bugs to her home. The medicines are getting into my system, trying to do the right thing, but my stressors are overwhelming. I don't want to live like this any more".   Objective: 51 y.o Caucasian male, divorced, homeless, unemployed.  Background history of SUD and mood disorder . Presented to the ER via emergency services. Overdosed on thirty pills of Seroquel, gabapentin and hydroxyzine. Was intoxicated with alcohol. BAL304 mg/dl.  UDS was positive for benzodiazepines. Expressed hopelessness, worthlessness and death wish at presentation. Patient was managed medically and then transferred to our unit.  Chart reviewed today. Patient's case discussed at team meeting today, Patient reports that he continues to very depressed, worthless & hopeless. He says he is overwhelmed by his psychosocial stressors. He says he received a phone call today with bad news. He says his girlfriend's niece complained that he she has bed bug in her home & blamed him for spreading the bug. He says he does not know how to how to continue to live like this. He continues to endorse passive SI, however, says he feels safe here.  He has not been observed to be internally stimulated. He watches TV most of the time. He says nothing has changed.  No evidence of psychosis.   Principal Problem: MDD (major depressive disorder), recurrent severe, without psychosis (HCC) Diagnosis:   Patient Active Problem List   Diagnosis Date Noted  . Alcohol dependence (HCC) [F10.20] 06/19/2013    Priority: High    Class: Acute  . MDD (major depressive disorder), recurrent severe, without psychosis (HCC) [F33.2] 04/10/2013    Priority: High    Class: Acute  . GAD (generalized anxiety disorder) [F41.1] 04/10/2013    Priority: Medium  . Multiple lacunar  infarcts [I63.81] 10/21/2017  . Drug overdose, intentional, initial encounter (HCC) [T50.902A] 10/20/2017  . Drug overdose, intentional self-harm, initial encounter (HCC) [T50.902A] 10/20/2017  . Seizures (HCC) [R56.9] 10/20/2017  . Drug overdose [T50.901A] 10/20/2017  . Depression [F32.9] 01/11/2013  . Anxiety [F41.9] 01/11/2013   Total Time spent with patient: 15 minutes  Past Psychiatric History: As in H&P  Past Medical History:  Past Medical History:  Diagnosis Date  . Bipolar 1 disorder (HCC)   . Depression   . Seizures (HCC)   . Stroke [facility](HCC)     Past Surgical History:  Procedure Laterality Date  . ANKLE SURGERY    . SPLENECTOMY     Family History:  Family History  Problem Relation Age of Onset  . Hypertension Other    Family Psychiatric  History: As in H&P Social History:  Social History   Substance and Sexual Activity  Alcohol Use Yes   Comment: was drinking most days before being hospitalized     Social History   Substance and Sexual Activity  Drug Use Yes  . Types: Benzodiazepines   Comment: teenager used THC, only one occasion    Social History   Socioeconomic History  . Marital status: Married    Spouse name: None  . Number of children: None  . Years of education: None  . Highest education level: None  Social Needs  . Financial resource strain: None  . Food insecurity - worry: None  . Food insecurity - inability: None  . Transportation needs - medical: None  .  Transportation needs - non-medical: None  Occupational History  . None  Tobacco Use  . Smoking status: Current Every Day Smoker    Packs/day: 1.00    Years: 30.00    Pack years: 30.00    Types: Cigarettes  . Smokeless tobacco: Never Used  Substance and Sexual Activity  . Alcohol use: Yes    Comment: was drinking most days before being hospitalized  . Drug use: Yes    Types: Benzodiazepines    Comment: teenager used THC, only one occasion  . Sexual activity: Yes    Comment:   wife had hysterectomy  Other Topics Concern  . None  Social History Narrative  . None   Additional Social History:   Sleep: Fair  Appetite:  Fair  Current Medications: Current Facility-Administered Medications  Medication Dose Route Frequency Provider Last Rate Last Dose  . acetaminophen (TYLENOL) tablet 650 mg  650 mg Oral Q6H PRN Okonkwo, Justina A, NP   650 mg at 10/30/17 1025  . alum & mag hydroxide-simeth (MAALOX/MYLANTA) 200-200-20 MG/5ML suspension 30 mL  30 mL Oral Q4H PRN Okonkwo, Justina A, NP   30 mL at 11/02/17 0419  . divalproex (DEPAKOTE) DR tablet 500 mg  500 mg Oral Q12H Oneta Rack, NP   500 mg at 11/02/17 0750  . gabapentin (NEURONTIN) capsule 300 mg  300 mg Oral TID Georgiann Cocker, MD   300 mg at 11/02/17 1147  . hydrOXYzine (ATARAX/VISTARIL) tablet 50 mg  50 mg Oral TID PRN Micheal Likens, MD   50 mg at 11/01/17 2203  . LORazepam (ATIVAN) tablet 1 mg  1 mg Oral Q6H PRN Oneta Rack, NP   1 mg at 11/02/17 0752  . magnesium hydroxide (MILK OF MAGNESIA) suspension 30 mL  30 mL Oral Daily PRN Okonkwo, Justina A, NP      . nicotine (NICODERM CQ - dosed in mg/24 hours) patch 21 mg  21 mg Transdermal Daily Izediuno, Delight Ovens, MD   21 mg at 11/02/17 0750  . QUEtiapine (SEROQUEL) tablet 150 mg  150 mg Oral QHS Money, Gerlene Burdock, FNP   150 mg at 11/01/17 2203  . QUEtiapine (SEROQUEL) tablet 25 mg  25 mg Oral Q6H PRN Micheal Likens, MD   25 mg at 11/01/17 0907  . traZODone (DESYREL) tablet 50 mg  50 mg Oral QHS PRN Beryle Lathe, Justina A, NP   50 mg at 11/01/17 2205   Lab Results:  No results found for this or any previous visit (from the past 48 hour(s)).  Blood Alcohol level:  Lab Results  Component Value Date   ETH 305 [facility]) 10/20/2017   ETH <10 09/18/2017   Metabolic Disorder Labs: Lab Results  Component Value Date   HGBA1C 5.3 10/28/2017   MPG 105.41 10/28/2017   Lab Results  Component Value Date   PROLACTIN 8.3 10/28/2017   Lab  Results  Component Value Date   CHOL 194 10/28/2017   TRIG 159 (H) 10/28/2017   HDL 47 10/28/2017   CHOLHDL 4.1 10/28/2017   VLDL 32 10/28/2017   LDLCALC 115 (H) 10/28/2017   LDLCALC  08/20/2009    92        Total Cholesterol/HDL:CHD Risk Coronary Heart Disease Risk Table                     Men   Women  1/2 Average Risk   3.4   3.3  Average Risk  5.0   4.4  2 X Average Risk   9.6   7.1  3 X Average Risk  23.4   11.0        Use the calculated Patient Ratio above and the CHD Risk Table to determine the patient's CHD Risk.        ATP III CLASSIFICATION (LDL):  <100     mg/dL   Optimal  161-096  mg/dL   Near or Above                    Optimal  130-159  mg/dL   Borderline  045-409  mg/dL   High  >811     mg/dL   Very High    Physical Findings: AIMS: Facial and Oral Movements Muscles of Facial Expression: None, normal Lips and Perioral Area: None, normal Jaw: None, normal Tongue: None, normal,Extremity Movements Upper (arms, wrists, hands, fingers): None, normal Lower (legs, knees, ankles, toes): None, normal, Trunk Movements Neck, shoulders, hips: None, normal, Overall Severity Severity of abnormal movements (highest score from questions above): None, normal Incapacitation due to abnormal movements: None, normal Patient's awareness of abnormal movements (rate only patient's report): No Awareness, Dental Status Current problems with teeth and/or dentures?: No Does patient usually wear dentures?: No  CIWA:  CIWA-Ar Total: 0 COWS:     Musculoskeletal: Strength & Muscle Tone: within normal limits Gait & Station: normal Patient leans: N/A  Psychiatric Specialty Exam: Physical Exam  Constitutional: He appears well-developed and well-nourished.  HENT:  Head: Normocephalic and atraumatic.  Respiratory: Effort normal.  Neurological: He is alert.  Psychiatric:  As above    ROS  Blood pressure 91/66, pulse 93, temperature 97.7 F (36.5 C), temperature source  Oral, resp. rate 16, height 5\' 9"  (1.753 m), weight 83.9 kg (185 lb).Body mass index is 27.32 kg/m.  General Appearance: Casually dressed, was watching TV just befor interview. Moderate engagement.   Eye Contact:  Fair  Speech:  Clear and Coherent and Normal Rate  Volume:  Normal  Mood:  Depressed  Affect:  Congruent  Thought Process:  Linear  Orientation:  Full (Time, Place, and Person)  Thought Content:  Rumination  Suicidal Thoughts:  None currently  Homicidal Thoughts:  No  Memory:  Immediate;   Good Recent;   Good Remote;   Good  Judgement:  Fair  Insight:  Good  Psychomotor Activity:  Decreased  Concentration:  Concentration: Fair and Attention Span: Fair  Recall:  Good  Fund of Knowledge:  Good  Language:  Good  Akathisia:  Negative  Handed:    AIMS (if indicated):     Assets:  Communication Skills  ADL's:  Intact  Cognition:  WNL  Sleep:  Number of Hours: 6.75   Treatment Plan Summary: Today,Patient remains severely depressed. He says he is overwhelmed by his psychosocial circumstances and health issues. He remains at risk of suicide. We plan to explore locally available resources. Would continue medications at current dose.  Will continue today 11/02/2017 plan as below except where it is noted.  -Continue Seroquel 150 mg PO QHS for mood stability -Continue Seroquel 25 mg PO Q6H PRN for agitation -Continue Gabapentin 300 mg PO TID for agitation -Continue Depakote 500 mg PO Q12H for mood stabilization & seizure activities. -Continue Trazodone 50 mg PO QHS PRN for insomnia -Continue Vistaril 50 mg PO TID PRN for anxiety -Continue Ativan 1 mg Q6H PRN for anxiety or seizures. -Continue Nicotine patch 21 mg Q 24 hours  for smoking cessation. -Encourage group therapy participation  Sanjuana KavaNwoko, Agnes I, NP, PMHNP, FNP-BC 11/02/2017, 2:43 PMPatient ID: Nathaniel Warren, male   DOB: 03/22/1966, 51 y.o.   MRN: [id number]

## 2017-11-02 NOTE — Progress Notes (Signed)
Recreation Therapy Notes  Date: 11/02/17 Time:  0930 Location: 300 [address] Dayroom  Group Topic: Stress Management  Goal Area(s) Addresses:  Patient will verbalize importance of using healthy stress management.  Patient will identify positive emotions associated with healthy stress management.   Behavioral Response: Engaged  Intervention: Stress Management  Activity : Guided Imagery.  LRT introduced the stress management technique of guided imagery.  LRT read a script that allowed patients to envision listening to the waves at the beach.  Patients were to follow along as LRT read script.  Education:  Stress Management, Discharge Planning.   Education Outcome: Acknowledges edcuation/In group clarification offered/Needs additional education  Clinical Observations/Feedback: Pt attended group.   Caroll RancherMarjette Joell Buerger, LRT/CTRS         Caroll RancherLindsay, Luria Rosario A 11/02/2017 12:07 PM

## 2017-11-02 NOTE — Tx Team (Signed)
Interdisciplinary Treatment and Diagnostic Plan Update  11/02/2017 Time of Session: 0830AM Laurence SpatesJames T Spanos MRN: [id number]  Principal Diagnosis: MDD, recurrent, severe without psychosis   Secondary Diagnoses: Principal Problem:   MDD (major depressive disorder), recurrent severe, without psychosis (HCC) Active Problems:   GAD (generalized anxiety disorder)   Current Medications:  Current Facility-Administered Medications  Medication Dose Route Frequency Provider Last Rate Last Dose  . acetaminophen (TYLENOL) tablet 650 mg  650 mg Oral Q6H PRN Okonkwo, Justina A, NP   650 mg at 10/30/17 1025  . alum & mag hydroxide-simeth (MAALOX/MYLANTA) 200-200-20 MG/5ML suspension 30 mL  30 mL Oral Q4H PRN Okonkwo, Justina A, NP   30 mL at 11/02/17 0419  . divalproex (DEPAKOTE) DR tablet 500 mg  500 mg Oral Q12H Oneta RackLewis, Tanika N, NP   500 mg at 11/02/17 0750  . gabapentin (NEURONTIN) capsule 300 mg  300 mg Oral TID Georgiann CockerIzediuno, Vincent A, MD   300 mg at 11/02/17 0750  . hydrOXYzine (ATARAX/VISTARIL) tablet 50 mg  50 mg Oral TID PRN Micheal Likensainville, Christopher T, MD   50 mg at 11/01/17 2203  . LORazepam (ATIVAN) tablet 1 mg  1 mg Oral Q6H PRN Oneta RackLewis, Tanika N, NP   1 mg at 11/02/17 0752  . magnesium hydroxide (MILK OF MAGNESIA) suspension 30 mL  30 mL Oral Daily PRN Okonkwo, Justina A, NP      . nicotine (NICODERM CQ - dosed in mg/24 hours) patch 21 mg  21 mg Transdermal Daily Izediuno, Delight OvensVincent A, MD   21 mg at 11/02/17 0750  . QUEtiapine (SEROQUEL) tablet 150 mg  150 mg Oral QHS Money, Gerlene Burdockravis B, FNP   150 mg at 11/01/17 2203  . QUEtiapine (SEROQUEL) tablet 25 mg  25 mg Oral Q6H PRN Micheal Likensainville, Christopher T, MD   25 mg at 11/01/17 0907  . traZODone (DESYREL) tablet 50 mg  50 mg Oral QHS PRN Beryle Lathekonkwo, Justina A, NP   50 mg at 11/01/17 2205   PTA Medications: Medications Prior to Admission  Medication Sig Dispense Refill Last Dose  . albuterol (PROVENTIL HFA;VENTOLIN HFA) 108 (90 Base) MCG/ACT inhaler Inhale 2  puffs into the lungs every 4 (four) hours as needed for wheezing. 1 Inhaler 0   . busPIRone (BUSPAR) 10 MG tablet Take 1 tablet (10 mg total) by mouth 3 (three) times daily. For anxiety symptoms 90 tablet 0   . clopidogrel (PLAVIX) 75 MG tablet Take 1 tablet (75 mg total) by mouth daily. 30 tablet 0   . divalproex (DEPAKOTE) 500 MG DR tablet Take 1 tablet (500 mg total) by mouth every 12 (twelve) hours.     . folic acid (FOLVITE) 1 MG tablet Take 1 tablet (1 mg total) by mouth daily.     . hydrOXYzine (ATARAX/VISTARIL) 50 MG tablet Take 1 tablet (50 mg total) by mouth 4 (four) times daily as needed for anxiety. 30 tablet 0   . ibuprofen (ADVIL,MOTRIN) 200 MG tablet Take 3 tablets (600 mg total) by mouth every 6 (six) hours as needed for mild pain. 30 tablet 0   . lithium carbonate 300 MG capsule Take 2 capsules (600 mg total) by mouth at bedtime.     . Multiple Vitamin (MULTIVITAMIN WITH MINERALS) TABS tablet Take 1 tablet by mouth daily.     . nicotine (NICODERM CQ - DOSED IN MG/24 HOURS) 21 mg/24hr patch Place 1 patch (21 mg total) onto the skin daily. 28 patch 0   . QUEtiapine (SEROQUEL) 25 MG  tablet Take 1 tablet (25 mg total) by mouth 2 (two) times daily. FOR AGITATION, ANXIETY, AND INSOMNIA     . QUEtiapine (SEROQUEL) 300 MG tablet Take 1 tablet (300 mg total) by mouth at bedtime.     . thiamine 100 MG tablet Take 1 tablet (100 mg total) by mouth daily.     . traZODone (DESYREL) 100 MG tablet Take 1 tablet (100 mg total) by mouth at bedtime as needed (ANXIETY).       Patient Stressors: Medication change or noncompliance Substance abuse  Patient Strengths: Ability for insight Active sense of humor Average or above average intelligence Capable of independent living General fund of knowledge Motivation for treatment/growth  Treatment Modalities: Medication Management, Group therapy, Case management,  1 to 1 session with clinician, Psychoeducation, Recreational therapy.   Physician  Treatment Plan for Primary Diagnosis: MDD, recurrent, severe without psychosis  Long Term Goal(s): Improvement in symptoms so as ready for discharge Improvement in symptoms so as ready for discharge   Short Term Goals: Ability to identify changes in lifestyle to reduce recurrence of condition will improve Ability to verbalize feelings will improve Ability to disclose and discuss suicidal ideas Ability to identify and develop effective coping behaviors will improve Compliance with prescribed medications will improve Ability to identify triggers associated with substance abuse/mental health issues will improve  Medication Management: Evaluate patient's response, side effects, and tolerance of medication regimen.  Therapeutic Interventions: 1 to 1 sessions, Unit Group sessions and Medication administration.  Evaluation of Outcomes: Progressing  Physician Treatment Plan for Secondary Diagnosis: Principal Problem:   MDD (major depressive disorder), recurrent severe, without psychosis (HCC) Active Problems:   GAD (generalized anxiety disorder)  Long Term Goal(s): Improvement in symptoms so as ready for discharge Improvement in symptoms so as ready for discharge   Short Term Goals: Ability to identify changes in lifestyle to reduce recurrence of condition will improve Ability to verbalize feelings will improve Ability to disclose and discuss suicidal ideas Ability to identify and develop effective coping behaviors will improve Compliance with prescribed medications will improve Ability to identify triggers associated with substance abuse/mental health issues will improve     Medication Management: Evaluate patient's response, side effects, and tolerance of medication regimen.  Therapeutic Interventions: 1 to 1 sessions, Unit Group sessions and Medication administration.  Evaluation of Outcomes: Progressing  RN Treatment Plan for Primary Diagnosis: MDD, recurrent, severe without  psychosis  Long Term Goal(s): Knowledge of disease and therapeutic regimen to maintain health will improve  Short Term Goals: Ability to remain free from injury will improve, Ability to verbalize feelings will improve and Ability to disclose and discuss suicidal ideas  Medication Management: RN will administer medications as ordered by provider, will assess and evaluate patient's response and provide education to patient for prescribed medication. RN will report any adverse and/or side effects to prescribing provider.  Therapeutic Interventions: 1 on 1 counseling sessions, Psychoeducation, Medication administration, Evaluate responses to treatment, Monitor vital signs and CBGs as ordered, Perform/monitor CIWA, COWS, AIMS and Fall Risk screenings as ordered, Perform wound care treatments as ordered.  Evaluation of Outcomes: Progressing  LCSW Treatment Plan for Primary Diagnosis: MDD, recurrent, severe without psychosis  Long Term Goal(s): Safe transition to appropriate next level of care at discharge, Engage patient in therapeutic group addressing interpersonal concerns.  Short Term Goals: Engage patient in aftercare planning with referrals and resources, Facilitate acceptance of mental health diagnosis and concerns and Facilitate patient progression through stages of change regarding  substance use diagnoses and concerns  Therapeutic Interventions: Assess for all discharge needs, 1 to 1 time with Child psychotherapistocial worker, Explore available resources and support systems, Assess for adequacy in community support network, Educate family and significant other(s) on suicide prevention, Complete Psychosocial Assessment, Interpersonal group therapy.  Evaluation of Outcomes: Progressing  Progress in Treatment: Attending groups: Intermittently Participating in groups: Minimally  Taking medication as prescribed: Yes. Toleration medication: Yes. Family/Significant other contact made: SPE completed with pt; pt  declined to consent to family contact.  Patient understands diagnosis: Yes. Discussing patient identified problems/goals with staff: Yes. Medical problems stabilized or resolved: Yes. Denies suicidal/homicidal ideation: No, pt continues to endorse passive SI/able to contract for safety on the unit.  Issues/concerns per patient self-inventory: No. Other: n/a  New problem(s) identified: No, Describe:  n/a  New Short Term/Long Term Goal(s): medication management for detox/mood stabilization; development of comprehensive mental wellness/sobriety plan.   Discharge Plan or Barriers: CSW assessing for appropriate referrals. Pt reports that he is homeless and has been going to [facility] for outpatient mental health services. Last admission in 2014, pt [facility]. CSW exploring options with pt.   Reason for Continuation of Hospitalization: Anxiety Depression Medication stabilization Suicidal ideation  Estimated Length of Stay: Tuesday, 11/03/17  Attendees: Patient: 11/02/2017 8:45 AM  Physician: Dr. Jackquline BerlinIzediuno MD; Dr. Altamese Carolinaainville MD 11/02/2017 8:45 AM  Nursing: Frederich ChaMarian, Caroline RN 11/02/2017 8:45 AM  RN Care Manager:x 11/02/2017 8:45 AM  Social Worker: Chartered loss adjusterHeather Smart, LCSW 11/02/2017 8:45 AM  Recreational Therapist: x 11/02/2017 8:45 AM  Other: Armandina StammerAgnes Nwoko NP 11/02/2017 8:45 AM  Other:  11/02/2017 8:45 AM  Other: 11/02/2017 8:45 AM    Scribe for Treatment Team: Ledell PeoplesHeather N Smart, LCSW 11/02/2017 8:45 AM

## 2017-11-02 NOTE — Plan of Care (Signed)
  Safety: Periods of time without injury will increase 11/02/2017 0928 - Progressing by Joice Lofts, RN   Medication: Compliance with prescribed medication regimen will improve 11/02/2017 0928 - Completed/Met by Joice Lofts, RN

## 2017-11-02 NOTE — Progress Notes (Signed)
D: Patient continues to report severe depressive symptoms.  His affect is flat and blunted.  He reports his sleep and appetite as "fair."  His energy level is low and his concentration is poor.  He rates his depression as an 8; hopelessness and anxiety as a 9.  His goal today is to "talk to the social worker and doctor."  Patient is depressed because he states, "I have no place to live, no job and no transportation."  He is complaining about cracked, dry and bleeding soles of his feet.  Patient was given neosporin and some petroleum jelly to apply to feet after showering.  Patient continues to have thoughts of self harm stating, "I can't continue living this way." A: Continue to monitor medication management and MD orders.  Safety checks completed every 15 minutes per protocol.  Offer support and encouragement as needed. R: Patient is receptive to staff; his behavior is appropriate.

## 2017-11-02 NOTE — Plan of Care (Signed)
Pt has been attending group on the unit with appropriate participation.  Pt has been sleeping well at night per documentation.

## 2017-11-03 MED ORDER — TRAZODONE HCL 50 MG PO TABS
50.0000 mg | ORAL_TABLET | Freq: Every evening | ORAL | Status: DC | PRN
  Administered 2017-11-03 – 2017-11-06 (×4): 50 mg via ORAL
  Filled 2017-11-03 (×2): qty 1
  Filled 2017-11-03: qty 14
  Filled 2017-11-03: qty 1

## 2017-11-03 MED ORDER — QUETIAPINE FUMARATE 200 MG PO TABS
200.0000 mg | ORAL_TABLET | Freq: Every day | ORAL | Status: DC
  Administered 2017-11-03 – 2017-11-06 (×4): 200 mg via ORAL
  Filled 2017-11-03 (×2): qty 1
  Filled 2017-11-03: qty 7
  Filled 2017-11-03 (×2): qty 1
  Filled 2017-11-03: qty 7

## 2017-11-03 NOTE — Progress Notes (Signed)
[facility]  11/03/2017 12:23 PM Nathaniel SpatesJames T Warren  MRN:  [id number] Subjective:   Nathaniel ChingJames Warren is a 51 y/o M with history of MDD who was admitted with worsening depression, SA via overdose, and use of alcohol. He was restarted on medications for depression and detoxified from alcohol. Today upon evaluation, pt reports that he is still suffering from severe depression. He is flat, withdrawn, and his gaze is downcast. When asked how he's doing today, pt replies, "I'm here." He endorses ongoing SI without plan. He denies AH/VH/HI. He states, "I just want to feel better or to end it." He endorses guilty feelings regarding his strained personal relationships, and most recently being blamed by his girlfriend's niece for bringing bed bugs into the home. He reports that he is able to get to sleep but then has middle insomnia and cannot return to sleep. We discussed adding an additional repeat dose of trazodone if needed, and pt was in agreement. He feels that seroquel is helpful at night and during the day, and pt was encouraged to utilize seroquel PRN instead of ativan which will be discontinued. We will also increase dose of seroquel at bedtime tonight.   Principal Problem: MDD (major depressive disorder), recurrent severe, without psychosis (HCC) Diagnosis:   Patient Active Problem List   Diagnosis Date Noted  . Multiple lacunar infarcts [I63.81] 10/21/2017  . Drug overdose, intentional, initial encounter (HCC) [T50.902A] 10/20/2017  . Drug overdose, intentional self-harm, initial encounter (HCC) [T50.902A] 10/20/2017  . Seizures (HCC) [R56.9] 10/20/2017  . Drug overdose [T50.901A] 10/20/2017  . Alcohol dependence (HCC) [F10.20] 06/19/2013    Class: Acute  . MDD (major depressive disorder), recurrent severe, without psychosis (HCC) [F33.2] 04/10/2013    Class: Acute  . GAD (generalized anxiety disorder) [F41.1] 04/10/2013  . Depression [F32.9] 01/11/2013  . Anxiety [F41.9] 01/11/2013    Total Time spent with patient: 30 minutes  Past Psychiatric History: see H&P  Past Medical History:  Past Medical History:  Diagnosis Date  . Bipolar 1 disorder (HCC)   . Depression   . Seizures (HCC)   . Stroke [facility](HCC)     Past Surgical History:  Procedure Laterality Date  . ANKLE SURGERY    . SPLENECTOMY     Family History:  Family History  Problem Relation Age of Onset  . Hypertension Other    Family Psychiatric  History: see H&P Social History:  Social History   Substance and Sexual Activity  Alcohol Use Yes   Comment: was drinking most days before being hospitalized     Social History   Substance and Sexual Activity  Drug Use Yes  . Types: Benzodiazepines   Comment: teenager used THC, only one occasion    Social History   Socioeconomic History  . Marital status: Married    Spouse name: None  . Number of children: None  . Years of education: None  . Highest education level: None  Social Needs  . Financial resource strain: None  . Food insecurity - worry: None  . Food insecurity - inability: None  . Transportation needs - medical: None  . Transportation needs - non-medical: None  Occupational History  . None  Tobacco Use  . Smoking status: Current Every Day Smoker    Packs/day: 1.00    Years: 30.00    Pack years: 30.00    Types: Cigarettes  . Smokeless tobacco: Never Used  Substance and Sexual Activity  . Alcohol use: Yes    Comment: was  drinking most days before being hospitalized  . Drug use: Yes    Types: Benzodiazepines    Comment: teenager used THC, only one occasion  . Sexual activity: Yes    Comment:  wife had hysterectomy  Other Topics Concern  . None  Social History Narrative  . None   Additional Social History:                         Sleep: Fair  Appetite:  Fair  Current Medications: Current Facility-Administered Medications  Medication Dose Route Frequency Provider Last Rate Last Dose  . acetaminophen  (TYLENOL) tablet 650 mg  650 mg Oral Q6H PRN Okonkwo, Justina A, NP   650 mg at 10/30/17 1025  . alum & mag hydroxide-simeth (MAALOX/MYLANTA) 200-200-20 MG/5ML suspension 30 mL  30 mL Oral Q4H PRN Okonkwo, Justina A, NP   30 mL at 11/02/17 0419  . divalproex (DEPAKOTE) DR tablet 500 mg  500 mg Oral Q12H Oneta RackLewis, Tanika N, NP   500 mg at 11/03/17 0827  . gabapentin (NEURONTIN) capsule 300 mg  300 mg Oral TID Georgiann CockerIzediuno, Vincent A, MD   300 mg at 11/03/17 1143  . hydrOXYzine (ATARAX/VISTARIL) tablet 50 mg  50 mg Oral TID PRN Micheal Likensainville, Nicolet Griffy T, MD   50 mg at 11/03/17 1146  . LORazepam (ATIVAN) tablet 1 mg  1 mg Oral Q6H PRN Oneta RackLewis, Tanika N, NP   1 mg at 11/02/17 0752  . magnesium hydroxide (MILK OF MAGNESIA) suspension 30 mL  30 mL Oral Daily PRN Okonkwo, Justina A, NP      . nicotine (NICODERM CQ - dosed in mg/24 hours) patch 21 mg  21 mg Transdermal Daily Izediuno, Delight OvensVincent A, MD   21 mg at 11/03/17 1147  . QUEtiapine (SEROQUEL) tablet 200 mg  200 mg Oral QHS Jolyne Loaainville, Jazmin Ley T, MD      . QUEtiapine (SEROQUEL) tablet 25 mg  25 mg Oral Q6H PRN Micheal Likensainville, Aaminah Forrester T, MD   25 mg at 11/02/17 1711  . traZODone (DESYREL) tablet 50 mg  50 mg Oral QHS PRN Micheal Likensainville, Orland Visconti T, MD        Lab Results: No results found for this or any previous visit (from the past 48 hour(s)).  Blood Alcohol level:  Lab Results  Component Value Date   ETH [facility](HH) 10/20/2017   ETH <10 09/18/2017    Metabolic Disorder Labs: Lab Results  Component Value Date   HGBA1C 5.3 10/28/2017   MPG 105.41 10/28/2017   Lab Results  Component Value Date   PROLACTIN 8.3 10/28/2017   Lab Results  Component Value Date   CHOL 194 10/28/2017   TRIG 159 (H) 10/28/2017   HDL 47 10/28/2017   CHOLHDL 4.1 10/28/2017   VLDL 32 10/28/2017   LDLCALC 115 (H) 10/28/2017   LDLCALC  08/20/2009    92        Total Cholesterol/HDL:CHD Risk Coronary Heart Disease Risk Table                     Men   Women  1/2 Average  Risk   3.4   3.3  Average Risk       5.0   4.4  2 X Average Risk   9.6   7.1  3 X Average Risk  23.4   11.0        Use the calculated Patient Ratio above and the CHD Risk Table to determine  the patient's CHD Risk.        ATP III CLASSIFICATION (LDL):  <100     mg/dL   Optimal  161-096  mg/dL   Near or Above                    Optimal  130-159  mg/dL   Borderline  045-409  mg/dL   High  >811     mg/dL   Very High    Physical Findings: AIMS: Facial and Oral Movements Muscles of Facial Expression: None, normal Lips and Perioral Area: None, normal Jaw: None, normal Tongue: None, normal,Extremity Movements Upper (arms, wrists, hands, fingers): None, normal Lower (legs, knees, ankles, toes): None, normal, Trunk Movements Neck, shoulders, hips: None, normal, Overall Severity Severity of abnormal movements (highest score from questions above): None, normal Incapacitation due to abnormal movements: None, normal Patient's awareness of abnormal movements (rate only patient's report): No Awareness, Dental Status Current problems with teeth and/or dentures?: No Does patient usually wear dentures?: No  CIWA:  CIWA-Ar Total: 0 COWS:     Musculoskeletal: Strength & Muscle Tone: within normal limits Gait & Station: normal Patient leans: N/A  Psychiatric Specialty Exam: Physical Exam  Nursing note and vitals reviewed.   Review of Systems  Constitutional: Negative for chills and fever.  Respiratory: Negative for cough and shortness of breath.   Cardiovascular: Negative for chest pain and palpitations.  Gastrointestinal: Negative for heartburn and nausea.  Psychiatric/Behavioral: Positive for depression and suicidal ideas. Negative for hallucinations. The patient is nervous/anxious.     Blood pressure 92/62, pulse 88, temperature 97.9 F (36.6 C), temperature source Oral, resp. rate 15, height 5\' 9"  (1.753 m), weight 83.9 kg (185 lb).Body mass index is 27.32 kg/m.  General  Appearance: Casual  Eye Contact:  Poor  Speech:  Clear and Coherent and Slow  Volume:  Decreased  Mood:  Anxious and Depressed  Affect:  Congruent, Constricted and Flat  Thought Process:  Coherent and Goal Directed  Orientation:  Full (Time, Place, and Person)  Thought Content:  Logical  Suicidal Thoughts:  Yes.  without intent/plan  Homicidal Thoughts:  No  Memory:  Immediate;   Good Recent;   Good Remote;   Good  Judgement:  Fair  Insight:  Lacking  Psychomotor Activity:  Normal  Concentration:  Concentration: Good  Recall:  Good  Fund of Knowledge:  Fair  Language:  Good  Akathisia:  No  Handed:    AIMS (if indicated):     Assets:  Communication Skills Desire for Improvement Leisure Time Physical Health Resilience Social Support  ADL's:  Intact  Cognition:  WNL  Sleep:  Number of Hours: 6.75     Treatment Plan Summary: Daily contact with patient to assess and evaluate symptoms and progress in treatment and Medication management. Pt continues to have significant depression and SI, but he notes that seroquel has been helpful. We will increase his nighttime dose and encourage use of seroquel PRN.  - Continue inpatient hospitalization -Change Seroquel 150 mg PO QHS to seroquel 200mg  qhs for mood stability -Continue Seroquel 25 mg PO Q6H PRN for agitation -Continue Gabapentin 300 mg PO TID for agitation -Continue Depakote 500 mg PO Q12H for mood stabilization & seizure activities. -Continue Trazodone 50 mg PO QHS PRN for insomnia (pt may repeat x1) -Continue Vistaril 50 mg PO TID PRN for anxiety -Discontinue Ativan 1 mg Q6H PRN for anxiety or seizures. -Continue Nicotine patch 21 mg Q 24 hours for  smoking cessation.  -Encourage group therapy participation    Micheal Likens, MD 11/03/2017, 12:23 PM

## 2017-11-03 NOTE — Progress Notes (Signed)
Recreation Therapy Notes  Animal-Assisted Activity (AAA) Program Checklist/Progress Notes Patient Eligibility Criteria Checklist & Daily Group note for Rec TxIntervention  Date: 11.27.2018 Time: 2:45pm Location: Adult Unit  AAA/T Program Assumption of Risk Form signed by Patient/ or Parent Legal Guardian Yes  Patient is free of allergies or sever asthma Yes  Patient reports no fear of animals Yes  Patient reports no history of cruelty to animals Yes  Patient understands his/her participation is voluntary Yes  Behavioral Response: Did not attend.   Marykay Lexenise L Adelbert Gaspard, LRT/CTRS        Delmer Kowalski L 11/03/2017 3:30 PM

## 2017-11-03 NOTE — Progress Notes (Signed)
Patient attended AA group meeting tonight.  

## 2017-11-03 NOTE — Progress Notes (Signed)
D: Patient presents with sad and depressed mood on unit. Stated his day was "ok". Denies pain, SI/HI, AH/VH at this time. Endorses depression but contracts for safety while on unit. Verbalized no concern. No behavior issues noted.   A: Staff offered support and encouragement as needed. Routine safety checks maintained. Will continue to monitor patient.   R: Patient remains safe and approprate on unit.

## 2017-11-03 NOTE — Progress Notes (Signed)
Patient ID: Nathaniel SpatesJames T Warren, male   DOB: 10/04/1966, 51 y.o.   MRN: [id number]   D: Patient was pleasant on approach tonight. Reports his mood has been up and down today. Contracts for safety on the unit. No active SI at present. Reports issues with anxiety and sleep. Obtained vistaril and trazodone at bedtime with regular meds saying it is the only way he will sleep. A: Staff will monitor on q 15 minute checks, follow treatment plan, and give meds as ordered. R: Cooperative on the unit

## 2017-11-03 NOTE — Progress Notes (Signed)
Patient ID: Nathaniel SpatesJames T Warren, male   DOB: 05/18/1966, 51 y.o.   MRN: [id number] D  ---    Pt agrees to contract for safety and denies pain at this time.  He has been friendly and cooperative  and shows no negative behaviors.   Pt is pleasant and interacts well with peers.  He is assertive and has good eye contact.   He takes medications as asked and shows no adverse effects .Marland Kitchen.  ---  A --  Provide support and safety  --- R ---  Pt remains safe on unit

## 2017-11-03 NOTE — [facility] Group Notes (Addendum)
Pt attended spiritual care group on grief and loss facilitated by chaplain Burnis KingfisherMatthew Maylee Bare   Group opened with brief discussion and psycho-social ed around grief and loss in relationships and in relation to self - identifying life patterns, circumstances, changes that cause losses. Established group norm of speaking from own life experience. Group goal of establishing open and affirming space for members to share loss and experience with grief, normalize grief experience and provide psycho social education and grief support.   Nathaniel Warren arrived at group late.  Remained in group through latter 75% of group.   He was alert and attentive to group discussion evidenced by body language, head nods and eye contact with facilitator and other group members.  He was affirming of other group members through head nods, did not participate in group discussion verbally.     Nathaniel Warren, MDiv

## 2017-11-04 NOTE — Progress Notes (Signed)
[facility] MD Progress Note  11/04/2017 2:29 PM Nathaniel Warren  MRN:  [id number]  Subjective: Nathaniel Warren reports, "My mood has improved today. I finally got some sleep last night, thanks to the increase in the Seroquel dose. It helped my racing thoughts to stop. But, I still feel like hurting myself, I feel safe here".    Objective: Nathaniel Warren is a 51 y/o M with history of MDD who was admitted with worsening depression, SA via overdose, and use of alcohol. He was restarted on medications for depression and detoxified from alcohol. Today 11-04-17, upon evaluation, pt reports improved symptoms of depression. His affect is reactive. He still presents withdrawn & his gaze, a little downcast. He is still endorsing guilty feelings regarding his strained personal relationships, and most recently being blamed by his girlfriend's niece for bringing bed bugs into the home. He reports that he was able to get to sleep last night.  He feels that the increase on the Seroquel dose was helpful last night. He is encouraged to utilize the Seroquel PRN as recommended. Staff continue to provide support.  Principal Problem: MDD (major depressive disorder), recurrent severe, without psychosis (HCC) Diagnosis:   Patient Active Problem List   Diagnosis Date Noted  . Alcohol dependence (HCC) [F10.20] 06/19/2013    Priority: High    Class: Acute  . MDD (major depressive disorder), recurrent severe, without psychosis (HCC) [F33.2] 04/10/2013    Priority: High    Class: Acute  . GAD (generalized anxiety disorder) [F41.1] 04/10/2013    Priority: Medium  . Multiple lacunar infarcts [I63.81] 10/21/2017  . Drug overdose, intentional, initial encounter (HCC) [T50.902A] 10/20/2017  . Drug overdose, intentional self-harm, initial encounter (HCC) [T50.902A] 10/20/2017  . Seizures (HCC) [R56.9] 10/20/2017  . Drug overdose [T50.901A] 10/20/2017  . Depression [F32.9] 01/11/2013  . Anxiety [F41.9] 01/11/2013   Total Time spent with  patient: 15 minutes  Past Psychiatric History: see H&P  Past Medical History:  Past Medical History:  Diagnosis Date  . Bipolar 1 disorder (HCC)   . Depression   . Seizures (HCC)   . Stroke [facility])     Past Surgical History:  Procedure Laterality Date  . ANKLE SURGERY    . SPLENECTOMY     Family History:  Family History  Problem Relation Age of Onset  . Hypertension Other    Family Psychiatric  History: see H&P Social History:  Social History   Substance and Sexual Activity  Alcohol Use Yes   Comment: was drinking most days before being hospitalized     Social History   Substance and Sexual Activity  Drug Use Yes  . Types: Benzodiazepines   Comment: teenager used THC, only one occasion    Social History   Socioeconomic History  . Marital status: Married    Spouse name: None  . Number of children: None  . Years of education: None  . Highest education level: None  Social Needs  . Financial resource strain: None  . Food insecurity - worry: None  . Food insecurity - inability: None  . Transportation needs - medical: None  . Transportation needs - non-medical: None  Occupational History  . None  Tobacco Use  . Smoking status: Current Every Day Smoker    Packs/day: 1.00    Years: 30.00    Pack years: 30.00    Types: Cigarettes  . Smokeless tobacco: Never Used  Substance and Sexual Activity  . Alcohol use: Yes    Comment: was drinking most  days before being hospitalized  . Drug use: Yes    Types: Benzodiazepines    Comment: teenager used THC, only one occasion  . Sexual activity: Yes    Comment:  wife had hysterectomy  Other Topics Concern  . None  Social History Narrative  . None   Additional Social History:   Sleep: Fair  Appetite:  Fair  Current Medications: Current Facility-Administered Medications  Medication Dose Route Frequency Provider Last Rate Last Dose  . acetaminophen (TYLENOL) tablet 650 mg  650 mg Oral Q6H PRN Okonkwo, Justina A,  NP   650 mg at 10/30/17 1025  . alum & mag hydroxide-simeth (MAALOX/MYLANTA) 200-200-20 MG/5ML suspension 30 mL  30 mL Oral Q4H PRN Okonkwo, Justina A, NP   30 mL at 11/02/17 0419  . divalproex (DEPAKOTE) DR tablet 500 mg  500 mg Oral Q12H Oneta RackLewis, Tanika N, NP   500 mg at 11/04/17 [id number]  . gabapentin (NEURONTIN) capsule 300 mg  300 mg Oral TID Georgiann CockerIzediuno, Vincent A, MD   300 mg at 11/04/17 1137  . hydrOXYzine (ATARAX/VISTARIL) tablet 50 mg  50 mg Oral TID PRN Micheal Likensainville, Christopher T, MD   50 mg at 11/04/17 1137  . LORazepam (ATIVAN) tablet 1 mg  1 mg Oral Q6H PRN Oneta RackLewis, Tanika N, NP   1 mg at 11/02/17 0752  . magnesium hydroxide (MILK OF MAGNESIA) suspension 30 mL  30 mL Oral Daily PRN Okonkwo, Justina A, NP      . nicotine (NICODERM CQ - dosed in mg/24 hours) patch 21 mg  21 mg Transdermal Daily Izediuno, Delight OvensVincent A, MD   21 mg at 11/04/17 [id number]  . QUEtiapine (SEROQUEL) tablet 200 mg  200 mg Oral QHS Micheal Likensainville, Christopher T, MD   200 mg at 11/03/17 2203  . QUEtiapine (SEROQUEL) tablet 25 mg  25 mg Oral Q6H PRN Micheal Likens[place], Christopher T, MD   25 mg at 11/02/17 1711  . traZODone (DESYREL) tablet 50 mg  50 mg Oral QHS PRN Micheal Likensainville, Christopher T, MD   50 mg at 11/03/17 2203    Lab Results: No results found for this or any previous visit (from the past 48 hour(s)).  Blood Alcohol level:  Lab Results  Component Value Date   ETH [facility](HH) 10/20/2017   ETH <10 09/18/2017    Metabolic Disorder Labs: Lab Results  Component Value Date   HGBA1C 5.3 10/28/2017   MPG 105.41 10/28/2017   Lab Results  Component Value Date   PROLACTIN 8.3 10/28/2017   Lab Results  Component Value Date   CHOL 194 10/28/2017   TRIG 159 (H) 10/28/2017   HDL 47 10/28/2017   CHOLHDL 4.1 10/28/2017   VLDL 32 10/28/2017   LDLCALC 115 (H) 10/28/2017   LDLCALC  08/20/2009    92        Total Cholesterol/HDL:CHD Risk Coronary Heart Disease Risk Table                     Men   Women  1/2 Average Risk   3.4   3.3   Average Risk       5.0   4.4  2 X Average Risk   9.6   7.1  3 X Average Risk  23.4   11.0        Use the calculated Patient Ratio above and the CHD Risk Table to determine the patient's CHD Risk.        ATP III CLASSIFICATION (LDL):  <100  mg/dL   Optimal  [id number]  mg/dL   Near or Above                    Optimal  130-159  mg/dL   Borderline  [id number]  mg/dL   High  >528>190     mg/dL   Very High   Physical Findings: AIMS: Facial and Oral Movements Muscles of Facial Expression: None, normal Lips and Perioral Area: None, normal Jaw: None, normal Tongue: None, normal,Extremity Movements Upper (arms, wrists, hands, fingers): None, normal Lower (legs, knees, ankles, toes): None, normal, Trunk Movements Neck, shoulders, hips: None, normal, Overall Severity Severity of abnormal movements (highest score from questions above): None, normal Incapacitation due to abnormal movements: None, normal Patient's awareness of abnormal movements (rate only patient's report): No Awareness, Dental Status Current problems with teeth and/or dentures?: No Does patient usually wear dentures?: No  CIWA:  CIWA-Ar Total: 0 COWS:     Musculoskeletal: Strength & Muscle Tone: within normal limits Gait & Station: normal Patient leans: N/A  Psychiatric Specialty Exam: Physical Exam  Nursing note and vitals reviewed.   Review of Systems  Constitutional: Negative for chills and fever.  Respiratory: Negative for cough and shortness of breath.   Cardiovascular: Negative for chest pain and palpitations.  Gastrointestinal: Negative for heartburn and nausea.  Psychiatric/Behavioral: Positive for depression and suicidal ideas. Negative for hallucinations. The patient is nervous/anxious.     Blood pressure (!) 86/63, pulse 94, temperature 97.9 F (36.6 C), temperature source Oral, resp. rate 18, height 5\' 9"  (1.753 m), weight 83.9 kg (185 lb).Body mass index is 27.32 kg/m.  General Appearance: Casual  Eye  Contact:  Poor  Speech:  Clear and Coherent and Slow  Volume:  Decreased  Mood:  Anxious and Depressed  Affect:  Congruent, Constricted and Flat  Thought Process:  Coherent and Goal Directed  Orientation:  Full (Time, Place, and Person)  Thought Content:  Logical  Suicidal Thoughts:  Yes.  without intent/plan  Homicidal Thoughts:  No  Memory:  Immediate;   Good Recent;   Good Remote;   Good  Judgement:  Fair  Insight:  Lacking  Psychomotor Activity:  Normal  Concentration:  Concentration: Good  Recall:  Good  Fund of Knowledge:  Fair  Language:  Good  Akathisia:  No  Handed:    AIMS (if indicated):     Assets:  Communication Skills Desire for Improvement Leisure Time Physical Health Resilience Social Support  ADL's:  Intact  Cognition:  WNL  Sleep:  Number of Hours: 6.25     Treatment Plan Summary: Daily contact with patient to assess and evaluate symptoms and progress in treatment and Medication management. Pt improved t depressive symptoms and passive SI. Denies any plans or intent to hurt himself. He notes that the increase Seroquel has helped him to sleep better.  Encourage use of Seroquel PRN.  - Continue inpatient hospitalization -Continue Seroquel 200mg  qhs for mood stability -Continue Seroquel 25 mg PO Q6H PRN for agitation -Continue Gabapentin 300 mg PO TID for agitation -Continue Depakote 500 mg PO Q12H for mood stabilization & seizure activities. -Continue Trazodone 50 mg PO QHS PRN for insomnia (pt may repeat x1) -Continue Vistaril 50 mg PO TID PRN for anxiety -Discontinue Ativan 1 mg Q6H PRN for anxiety or seizures. -Continue Nicotine patch 21 mg Q 24 hours for smoking cessation.  -Encourage group therapy participation  Sanjuana KavaNwoko, Saylah Ketner I, NP, PMHNP, FNP-BC. 11/04/2017, 2:29 PMPatient ID: Nathaniel SpatesJames T Warren,  male   DOB: 07/18/1966, 51 y.o.   MRN: [id number]

## 2017-11-04 NOTE — Plan of Care (Signed)
Patient verbalizes understanding of information, education provided. 

## 2017-11-04 NOTE — Progress Notes (Signed)
  DATA ACTION RESPONSE  Objective- Pt. is visible in the dayroom, seen eating a snack and watching TV.  Presents with a depressed/sad affect and mood. Pt brightens on approach. No further c/o. No abnormal s/s.  Subjective- Denies having any HI/AVH/Pain at this time. Endorses passive SI but verbal contracts for safety.   Is cooperative and remain safe on the unit.  1:1 interaction in private to establish rapport. Encouragement, education, & support given from staff.  PRN vistaril and trazodone requested and will re-eval accordingly.   Safety maintained with Q 15 checks. Continue with POC.

## 2017-11-04 NOTE — Plan of Care (Signed)
Pt slept 6.25 hrs last night.

## 2017-11-04 NOTE — Progress Notes (Signed)
Recreation Therapy Notes  Date: 11/04/17 Time: 0930 Location: 300 Hall Dayroom  Group Topic: Stress Management  Goal Area(s) Addresses:  Patient will verbalize importance of using healthy stress management.  Patient will identify positive emotions associated with healthy stress management.   Behavioral Response: Engaged  Intervention: Stress Management  Activity :  [employer] Meditation.  LRT introduced the stress management technique of meditation.  Patients were to listen and follow along as LRT played meditation from the Calm app.  Education:  Stress Management, Discharge Planning.   Education Outcome: Acknowledges edcuation/In group clarification offered/Needs additional education  Clinical Observations/Feedback: Pt attended group.    Caroll RancherMarjette Kemauri Musa, LRT/CTRS         Caroll RancherLindsay, Azelie Noguera A 11/04/2017 12:46 PM

## 2017-11-04 NOTE — Progress Notes (Signed)
D: Patient observed up and around unit though cautious in approach. Patient states he continues to struggle with anxiety, periods of agitation but does state he slept better with seroquel last night. "It has helped my mood. It just helps my mind to get that rest." Patient's affect flat, depressed with congruent mood. Per self inventory and discussions with writer, rates depression at an 8/10, hopelessness at a 9/10 and anxiety at an 8/10. Rates sleep as fair, appetite as fair, energy as low and concentration as poor.  States goal for today is to "talk to Child psychotherapistsocial worker and see doctor, talk to staff." Patient stated late afternoon "I had a bad phone call with my girlfriend. It's my fault. I dropped the ball. Also, it will be dinner soon and that makes me anxious." Denies pain, physical problems.   A: Medicated per orders, prn vistaril and seroquel given for per request for anxiety and agitation. Level III obs in place for safety. Emotional support offered and self inventory reviewed. Encouraged completion of Suicide Safety Plan and programming participation. Discussed POC with MD, SW.   R: Patient verbalizes understanding of POC. On reassess, patient reports a decrease in symptoms. Patient endorses passive SI but denies plan, intent. Verbal contract in place for safety. No HI/AVH and remains safe on level III obs.

## 2017-11-05 LAB — VALPROIC ACID LEVEL: Valproic Acid Lvl: 52 ug/mL (ref 50.0–100.0)

## 2017-11-05 NOTE — Progress Notes (Signed)
[facility] Progress [facility]  11/05/2017 3:17 PM Laurence SpatesJames T Ingalsbe  MRN:  [id number] Subjective:   Nathaniel ChingJames Warren is a 51 y/o M with history of MDD who was admitted with worsening depression, SA via overdose, and alcohol use. Today upon evaluation, pt reports that he continues to have incremental improvement of his mood symptoms. He notes that increase in dose of seroquel was helpful for him yesterday, as he is sleeping better during the night and he has more energy during the day. He reports that he continues to have SI without specific plan, and he explains, "I feel like I don't have anything  - like I don't have any purpose in life." Pt is able to contract for safety on the unit. Pt notes that vistaril and seroquel are helpful for daytime anxiety. He denies HI/AH/VH. His appetite is good. He would like to stay on his current medication regimen without changes at this time. We discussed that if pt continues to have improvement of mood symptoms that we will hopefully begin to discuss options for discharge in the coming days and pt verbalized good understanding. He had no further questions, comments, or concerns.  Principal Problem: MDD (major depressive disorder), recurrent severe, without psychosis (HCC) Diagnosis:   Patient Active Problem List   Diagnosis Date Noted  . Multiple lacunar infarcts [I63.81] 10/21/2017  . Drug overdose, intentional, initial encounter (HCC) [T50.902A] 10/20/2017  . Drug overdose, intentional self-harm, initial encounter (HCC) [T50.902A] 10/20/2017  . Seizures (HCC) [R56.9] 10/20/2017  . Drug overdose [T50.901A] 10/20/2017  . Alcohol dependence (HCC) [F10.20] 06/19/2013    Class: Acute  . MDD (major depressive disorder), recurrent severe, without psychosis (HCC) [F33.2] 04/10/2013    Class: Acute  . GAD (generalized anxiety disorder) [F41.1] 04/10/2013  . Depression [F32.9] 01/11/2013  . Anxiety [F41.9] 01/11/2013   Total Time spent with patient: 30 minutes  Past  Psychiatric History: see H&P  Past Medical History:  Past Medical History:  Diagnosis Date  . Bipolar 1 disorder (HCC)   . Depression   . Seizures (HCC)   . Stroke [facility](HCC)     Past Surgical History:  Procedure Laterality Date  . ANKLE SURGERY    . SPLENECTOMY     Family History:  Family History  Problem Relation Age of Onset  . Hypertension Other    Family Psychiatric  History: see H&P Social History:  Social History   Substance and Sexual Activity  Alcohol Use Yes   Comment: was drinking most days before being hospitalized     Social History   Substance and Sexual Activity  Drug Use Yes  . Types: Benzodiazepines   Comment: teenager used THC, only one occasion    Social History   Socioeconomic History  . Marital status: Married    Spouse name: None  . Number of children: None  . Years of education: None  . Highest education level: None  Social Needs  . Financial resource strain: None  . Food insecurity - worry: None  . Food insecurity - inability: None  . Transportation needs - medical: None  . Transportation needs - non-medical: None  Occupational History  . None  Tobacco Use  . Smoking status: Current Every Day Smoker    Packs/day: 1.00    Years: 30.00    Pack years: 30.00    Types: Cigarettes  . Smokeless tobacco: Never Used  Substance and Sexual Activity  . Alcohol use: Yes    Comment: was drinking most days before being hospitalized  .  Drug use: Yes    Types: Benzodiazepines    Comment: teenager used THC, only one occasion  . Sexual activity: Yes    Comment:  wife had hysterectomy  Other Topics Concern  . None  Social History Narrative  . None   Additional Social History:                         Sleep: Good  Appetite:  Good  Current Medications: Current Facility-Administered Medications  Medication Dose Route Frequency Provider Last Rate Last Dose  . acetaminophen (TYLENOL) tablet 650 mg  650 mg Oral Q6H PRN Okonkwo,  Justina A, NP   650 mg at 10/30/17 1025  . alum & mag hydroxide-simeth (MAALOX/MYLANTA) 200-200-20 MG/5ML suspension 30 mL  30 mL Oral Q4H PRN Okonkwo, Justina A, NP   30 mL at 11/02/17 0419  . divalproex (DEPAKOTE) DR tablet 500 mg  500 mg Oral Q12H Oneta Rack, NP   500 mg at 11/05/17 0854  . gabapentin (NEURONTIN) capsule 300 mg  300 mg Oral TID Georgiann Cocker, MD   300 mg at 11/05/17 1125  . hydrOXYzine (ATARAX/VISTARIL) tablet 50 mg  50 mg Oral TID PRN Micheal Likens, MD   50 mg at 11/04/17 2203  . LORazepam (ATIVAN) tablet 1 mg  1 mg Oral Q6H PRN Oneta Rack, NP   1 mg at 11/02/17 0752  . magnesium hydroxide (MILK OF MAGNESIA) suspension 30 mL  30 mL Oral Daily PRN Okonkwo, Justina A, NP      . nicotine (NICODERM CQ - dosed in mg/24 hours) patch 21 mg  21 mg Transdermal Daily Izediuno, Delight Ovens, MD   21 mg at 11/05/17 0856  . QUEtiapine (SEROQUEL) tablet 200 mg  200 mg Oral QHS Micheal Likens, MD   200 mg at 11/04/17 2203  . QUEtiapine (SEROQUEL) tablet 25 mg  25 mg Oral Q6H PRN Micheal Likens, MD   25 mg at 11/05/17 1135  . traZODone (DESYREL) tablet 50 mg  50 mg Oral QHS PRN Micheal Likens, MD   50 mg at 11/04/17 2203    Lab Results:  Results for orders placed or performed during the hospital encounter of 10/27/17 (from the past 48 hour(s))  Valproic acid level     Status: None   Collection Time: 11/05/17  6:34 AM  Result Value Ref Range   Valproic Acid Lvl 52 50.0 - 100.0 ug/mL    Comment: Performed at [facility], [address], [place], [place] [address]    Blood Alcohol level:  Lab Results  Component Value Date   ETH 305 [facility]) 10/20/2017   ETH <10 09/18/2017    Metabolic Disorder Labs: Lab Results  Component Value Date   HGBA1C 5.3 10/28/2017   MPG 105.41 10/28/2017   Lab Results  Component Value Date   PROLACTIN 8.3 10/28/2017   Lab Results  Component Value Date   CHOL 194 10/28/2017    TRIG 159 (H) 10/28/2017   HDL 47 10/28/2017   CHOLHDL 4.1 10/28/2017   VLDL 32 10/28/2017   LDLCALC 115 (H) 10/28/2017   LDLCALC  08/20/2009    92        Total Cholesterol/HDL:CHD Risk Coronary Heart Disease Risk Table                     Men   Women  1/2 Average Risk   3.4   3.3  Average Risk       5.0   4.4  2 X Average Risk   9.6   7.1  3 X Average Risk  23.4   11.0        Use the calculated Patient Ratio above and the CHD Risk Table to determine the patient's CHD Risk.        ATP III CLASSIFICATION (LDL):  <100     mg/dL   Optimal  841-660  mg/dL   Near or Above                    Optimal  130-159  mg/dL   Borderline  630-160  mg/dL   High  >109     mg/dL   Very High    Physical Findings: AIMS: Facial and Oral Movements Muscles of Facial Expression: None, normal Lips and Perioral Area: None, normal Jaw: None, normal Tongue: None, normal,Extremity Movements Upper (arms, wrists, hands, fingers): None, normal Lower (legs, knees, ankles, toes): None, normal, Trunk Movements Neck, shoulders, hips: None, normal, Overall Severity Severity of abnormal movements (highest score from questions above): None, normal Incapacitation due to abnormal movements: None, normal Patient's awareness of abnormal movements (rate only patient's report): No Awareness, Dental Status Current problems with teeth and/or dentures?: No Does patient usually wear dentures?: No  CIWA:  CIWA-Ar Total: 0 COWS:     Musculoskeletal: Strength & Muscle Tone: within normal limits Gait & Station: normal Patient leans: N/A  Psychiatric Specialty Exam: Physical Exam  Nursing note and vitals reviewed.   Review of Systems  Constitutional: Negative for chills and fever.  Respiratory: Negative for cough and shortness of breath.   Cardiovascular: Negative for chest pain.  Gastrointestinal: Negative for abdominal pain, heartburn, nausea and vomiting.  Psychiatric/Behavioral: Negative for depression,  hallucinations, substance abuse and suicidal ideas.    Blood pressure 92/61, pulse 79, temperature 98 F (36.7 C), temperature source Oral, resp. rate 18, height 5\' 9"  (1.753 m), weight 83.9 kg (185 lb).Body mass index is 27.32 kg/m.  General Appearance: Casual and Fairly Groomed  Eye Contact:  Good  Speech:  Clear and Coherent and Normal Rate  Volume:  Normal  Mood:  Anxious and Depressed  Affect:  Appropriate, Congruent and Constricted  Thought Process:  Coherent and Goal Directed  Orientation:  Full (Time, Place, and Person)  Thought Content:  Logical  Suicidal Thoughts:  Yes.  without intent/plan  Homicidal Thoughts:  No  Memory:  Immediate;   Good Recent;   Good Remote;   Good  Judgement:  Fair  Insight:  Fair  Psychomotor Activity:  Normal  Concentration:  Concentration: Fair  Recall:  [employer] of Knowledge:  Fair  Language:  Fair  Akathisia:  No  Handed:    AIMS (if indicated):     Assets:  Manufacturing systems engineer Physical Health  ADL's:  Intact  Cognition:  WNL  Sleep:  Number of Hours: 5.5     Treatment Plan Summary: Daily contact with patient to assess and evaluate symptoms and progress in treatment and Medication management. Pt reports improvement overall of his mood symptoms but he continues to have SI without plan and feelings of hopelessness, but he is beginning to better utilize his coping skills that we will begin to assess for possibility of discharge in the coming days. He would like to continue his current regimen without changes.  - Continue inpatient hospitalization -Continue Seroquel 200mg  qhs for mood stability -Continue Seroquel 25 mg PO  Q6H PRN for agitation -Continue Gabapentin 300 mg PO TID for agitation -Continue Depakote 500 mg PO Q12H for moodstabilization &seizure activities. -Continue Trazodone 50 mg PO QHS PRN for insomnia (pt may repeat x1) -Continue Vistaril 50 mg PO TID PRN for anxiety. -Continue Nicotine patch 21 mg Q 24 hours for  smoking cessation.  -Encourage group therapy participation - Discharge planning will be ongoing  Micheal Likenshristopher T Irvin Lizama, MD 11/05/2017, 3:17 PM

## 2017-11-05 NOTE — Progress Notes (Signed)
DAR NOTE: Patient presents with flat affect and depressed mood.  Denies pain, auditory and visual hallucinations.  Described energy level as low and concentration as poor.  Reports suicidal thoughts but contracts for safety.  Rates depression at 8, hopelessness at 9, and anxiety at 8.  Maintained on routine safety checks.  Medications given as prescribed.  Support and encouragement offered as needed.  Attended group and participated.  States goal for today is "talk to the Child psychotherapistsocial worker."  Patient visible in milieu for therapy and activities.  Minimal interaction with staff and peers.  Vistaril 50 mg given for complain of anxiety with good effect.  Patient is safe on the unit.

## 2017-11-05 NOTE — Plan of Care (Signed)
Safety Care Plan Documentation  Goal: Periods of time without injury will increase Intervention: Patient is on q15 minute safety checks and low fall risk precautions. Patient verbally contracts for safety on the unit. Outcome: Patient remains safe at this time  11/05/2017 10:45 PM - Progressing by Ferrel Loganollazo, Aidah Forquer A, RN

## 2017-11-05 NOTE — Progress Notes (Signed)
Patient ID: Laurence SpatesJames T Bloomquist, male   DOB: 05/27/1966, 51 y.o.   MRN: [id number]   Nursing Progress Note 1900-0730  D) Patient presents calm, pleasant and cooperative this evening. Patient did attended group and is seen interactive in the milieu. Patient denies SI/HI/AVH or pain. Patient contracts for safety on the unit. Patient reports sleeping well with current regimen. Patient compliant with medications. Patient requests Vistaril and Trazodone this evening with his bedtime medications.  A) Emotional support given. 1:1 interaction and active listening provided. Patient medicated as prescribed. Medications and plan of care reviewed with patient. Patient verbalized understanding without further questions. Snacks and fluids provided. Opportunities for questions or concerns presented to patient. Patient encouraged to continue to work on treatment goals. Labs, vital signs and patient behavior monitored throughout shift. Patient safety maintained with q15 min safety checks. Low fall risk precautions in place and reviewed with patient; patient verbalized understanding.  R) Patient receptive to interaction with nurse. Patient remains safe on the unit at this time. Patient denies any adverse medication reactions at this time. Patient is resting in bed without complaints. Will continue to monitor.

## 2017-11-05 NOTE — Progress Notes (Signed)
Pt attend wrap up group with NA. 

## 2017-11-05 NOTE — Plan of Care (Signed)
Patient is safe and free from injury. 

## 2017-11-05 NOTE — Progress Notes (Signed)
[facility] Group Notes:  (Nursing/MHT/Case Management/Adjunct)  Date:  11/05/2017  Time:  2045  Type of Therapy:  Wrap up group  Participation Level:  Minimal  Participation Quality:  Appropriate, Attentive, Sharing and Supportive  Affect:  Appropriate  Cognitive:  Appropriate  Insight:  Improving  Engagement in Group:  Engaged  Modes of Intervention:  Clarification, Education and Support  Summary of Progress/Problems: Pt reports being happy getting to talk with the social worker today. Pt shared that he is going to get back to AA when he leaves here. If pt could change any one thing it would be to have never picked up a drink or drug. Pt is grateful for his two children.   Marcille BuffyMcNeil, Sherlock Nancarrow S 11/05/2017, 9:25 PM

## 2017-11-05 NOTE — Progress Notes (Signed)
Pt and CSW called Winferd Humphreyee Gray ([facility]) for interview. Pt has no money and no valid ID-Tee made aware of this. [facility] and agreeable to [employer] terms. [facility]. [facility] pamphlet and AA/NA information provided.  Trula SladeHeather Smart, MSW, LCSW Clinical Social Worker 11/05/2017 2:36 PM

## 2017-11-06 NOTE — Progress Notes (Signed)
D    Pt is pleasant on approach and cooperative    He complained of dry cracked feet   He said he had a good day   He attended group and interacts appropriately with staff and peers  A    Verbal support given    Medications administered and effectiveness monitored   Q 15 min checks   Gave vaseline for his feet  R   Pt is safe at present time

## 2017-11-06 NOTE — Progress Notes (Signed)
Recreation Therapy Notes  Date: 11/06/17 Time: 0930 Location: 300 Hall Dayroom  Group Topic: Stress Management  Goal Area(s) Addresses:  Patient will verbalize importance of using healthy stress management.  Patient will identify positive emotions associated with healthy stress management.   Intervention: Stress Management  Activity :  Progressive Muscle Relaxation.  LRT introduced the stress management technique of progressive muscle relaxation.  LRT read a script to allow patients to tense and relax each muscle group one at a time.  Education:  Stress Management, Discharge Planning.   Education Outcome: Acknowledges edcuation/In group clarification offered/Needs additional education  Clinical Observations/Feedback: Pt did not attend group.    Caroll RancherMarjette Prarthana Parlin, LRT/CTRS         Caroll RancherLindsay, Brandey Vandalen A 11/06/2017 12:34 PM

## 2017-11-06 NOTE — Progress Notes (Signed)
[facility] MD Progress Note  11/06/2017 3:03 PM Nathaniel Warren  MRN:  [id number]  Subjective: Nathaniel Warren reports, "I'm still improving. I'm getting a lot of sleep. I feel a lot better mentally & physically. My mood has improved. I'm fixing to get discharged tomorrow morning. I will be going to [facility] here in [place]. I still have the SI, I'm working my way through it once the thoughts come in. No plans to hurt myself. While at the [facility], I will be attending the AA/NA meetings being held there".  Nathaniel Warren is a 51 y/o M with history of MDD who was admitted with worsening depression, SA via overdose, and alcohol use. Today upon evaluation, pt reports that he continues to have incremental improvement of his mood symptoms. He notes that increase in dose of seroquel was helpful for him yesterday, as he is sleeping better during the night and he has more energy during the day. He reports that he continues to have SI without specific plan. Pt is able to contract for safety on the unit. Pt notes that vistaril and seroquel are helpful for daytime anxiety. He denies HI/AH/VH. His appetite is good. He would like to stay on his current medication regimen without changes at this time. We discussed that if pt continues to have improvement of mood symptoms that we will hopefully discharge him in the morning of 09-07-17 and pt verbalized good understanding. He had no further questions, comments, or concerns.  Principal Problem: MDD (major depressive disorder), recurrent severe, without psychosis (HCC) Diagnosis:   Patient Active Problem List   Diagnosis Date Noted  . Alcohol dependence (HCC) [F10.20] 06/19/2013    Priority: High    Class: Acute  . MDD (major depressive disorder), recurrent severe, without psychosis (HCC) [F33.2] 04/10/2013    Priority: High    Class: Acute  . GAD (generalized anxiety disorder) [F41.1] 04/10/2013    Priority: Medium  . Multiple lacunar infarcts  [I63.81] 10/21/2017  . Drug overdose, intentional, initial encounter (HCC) [T50.902A] 10/20/2017  . Drug overdose, intentional self-harm, initial encounter (HCC) [T50.902A] 10/20/2017  . Seizures (HCC) [R56.9] 10/20/2017  . Drug overdose [T50.901A] 10/20/2017  . Depression [F32.9] 01/11/2013  . Anxiety [F41.9] 01/11/2013   Total Time spent with patient: 30 minutes  Past Psychiatric History: see H&P  Past Medical History:  Past Medical History:  Diagnosis Date  . Bipolar 1 disorder (HCC)   . Depression   . Seizures (HCC)   . Stroke [facility])     Past Surgical History:  Procedure Laterality Date  . ANKLE SURGERY    . SPLENECTOMY     Family History:  Family History  Problem Relation Age of Onset  . Hypertension Other    Family Psychiatric  History: see H&P Social History:  Social History   Substance and Sexual Activity  Alcohol Use Yes   Comment: was drinking most days before being hospitalized     Social History   Substance and Sexual Activity  Drug Use Yes  . Types: Benzodiazepines   Comment: teenager used THC, only one occasion    Social History   Socioeconomic History  . Marital status: Married    Spouse name: None  . Number of children: None  . Years of education: None  . Highest education level: None  Social Needs  . Financial resource strain: None  . Food insecurity - worry: None  . Food insecurity - inability: None  . Transportation needs - medical:  None  . Transportation needs - non-medical: None  Occupational History  . None  Tobacco Use  . Smoking status: Current Every Day Smoker    Packs/day: 1.00    Years: 30.00    Pack years: 30.00    Types: Cigarettes  . Smokeless tobacco: Never Used  Substance and Sexual Activity  . Alcohol use: Yes    Comment: was drinking most days before being hospitalized  . Drug use: Yes    Types: Benzodiazepines    Comment: teenager used THC, only one occasion  . Sexual activity: Yes    Comment:  wife had  hysterectomy  Other Topics Concern  . None  Social History Narrative  . None   Additional Social History:   Sleep: Good  Appetite:  Good  Current Medications: Current Facility-Administered Medications  Medication Dose Route Frequency Provider Last Rate Last Dose  . acetaminophen (TYLENOL) tablet 650 mg  650 mg Oral Q6H PRN Okonkwo, Justina A, NP   650 mg at 10/30/17 1025  . alum & mag hydroxide-simeth (MAALOX/MYLANTA) 200-200-20 MG/5ML suspension 30 mL  30 mL Oral Q4H PRN Okonkwo, Justina A, NP   30 mL at 11/02/17 0419  . divalproex (DEPAKOTE) DR tablet 500 mg  500 mg Oral Q12H Oneta RackLewis, Tanika N, NP   500 mg at 11/05/17 2205  . gabapentin (NEURONTIN) capsule 300 mg  300 mg Oral TID Georgiann CockerIzediuno, Vincent A, MD   300 mg at 11/06/17 1308  . hydrOXYzine (ATARAX/VISTARIL) tablet 50 mg  50 mg Oral TID PRN Micheal Likensainville, Christopher T, MD   50 mg at 11/05/17 2205  . LORazepam (ATIVAN) tablet 1 mg  1 mg Oral Q6H PRN Oneta RackLewis, Tanika N, NP   1 mg at 11/02/17 0752  . magnesium hydroxide (MILK OF MAGNESIA) suspension 30 mL  30 mL Oral Daily PRN Okonkwo, Justina A, NP      . nicotine (NICODERM CQ - dosed in mg/24 hours) patch 21 mg  21 mg Transdermal Daily Izediuno, Delight OvensVincent A, MD   21 mg at 11/05/17 0856  . QUEtiapine (SEROQUEL) tablet 200 mg  200 mg Oral QHS Micheal Likensainville, Christopher T, MD   200 mg at 11/05/17 2205  . QUEtiapine (SEROQUEL) tablet 25 mg  25 mg Oral Q6H PRN Micheal Likensainville, Christopher T, MD   25 mg at 11/05/17 1135  . traZODone (DESYREL) tablet 50 mg  50 mg Oral QHS PRN Micheal Likensainville, Christopher T, MD   50 mg at 11/05/17 2205    Lab Results:  Results for orders placed or performed during the hospital encounter of 10/27/17 (from the past 48 hour(s))  Valproic acid level     Status: None   Collection Time: 11/05/17  6:34 AM  Result Value Ref Range   Valproic Acid Lvl 52 50.0 - 100.0 ug/mL    Comment: Performed at [facility], [address] [place], [place] [address]    Blood  Alcohol level:  Lab Results  Component Value Date   ETH 305 [facility]) 10/20/2017   ETH <10 09/18/2017    Metabolic Disorder Labs: Lab Results  Component Value Date   HGBA1C 5.3 10/28/2017   MPG 105.41 10/28/2017   Lab Results  Component Value Date   PROLACTIN 8.3 10/28/2017   Lab Results  Component Value Date   CHOL 194 10/28/2017   TRIG 159 (H) 10/28/2017   HDL 47 10/28/2017   CHOLHDL 4.1 10/28/2017   VLDL 32 10/28/2017   LDLCALC 115 (H) 10/28/2017   LDLCALC  08/20/2009  92        Total Cholesterol/HDL:CHD Risk Coronary Heart Disease Risk Table                     Men   Women  1/2 Average Risk   3.4   3.3  Average Risk       5.0   4.4  2 X Average Risk   9.6   7.1  3 X Average Risk  23.4   11.0        Use the calculated Patient Ratio above and the CHD Risk Table to determine the patient's CHD Risk.        ATP III CLASSIFICATION (LDL):  <100     mg/dL   Optimal  [id number]  mg/dL   Near or Above                    Optimal  130-159  mg/dL   Borderline  [id number]  mg/dL   High  >811>190     mg/dL   Very High   Physical Findings: AIMS: Facial and Oral Movements Muscles of Facial Expression: None, normal Lips and Perioral Area: None, normal Jaw: None, normal Tongue: None, normal,Extremity Movements Upper (arms, wrists, hands, fingers): None, normal Lower (legs, knees, ankles, toes): None, normal, Trunk Movements Neck, shoulders, hips: None, normal, Overall Severity Severity of abnormal movements (highest score from questions above): None, normal Incapacitation due to abnormal movements: None, normal Patient's awareness of abnormal movements (rate only patient's report): No Awareness, Dental Status Current problems with teeth and/or dentures?: No Does patient usually wear dentures?: No  CIWA:  CIWA-Ar Total: 0 COWS:     Musculoskeletal: Strength & Muscle Tone: within normal limits Gait & Station: normal Patient leans: N/A  Psychiatric Specialty Exam: Physical Exam   Nursing note and vitals reviewed.   Review of Systems  Constitutional: Negative for chills and fever.  Respiratory: Negative for cough and shortness of breath.   Cardiovascular: Negative for chest pain.  Gastrointestinal: Negative for abdominal pain, heartburn, nausea and vomiting.  Psychiatric/Behavioral: Negative for depression, hallucinations, substance abuse and suicidal ideas.    Blood pressure 115/62, pulse 78, temperature 98 F (36.7 C), temperature source Oral, resp. rate 16, height 5\' 9"  (1.753 m), weight 83.9 kg (185 lb).Body mass index is 27.32 kg/m.  General Appearance: Casual and Fairly Groomed  Eye Contact:  Good  Speech:  Clear and Coherent and Normal Rate  Volume:  Normal  Mood:  Anxious and Depressed  Affect:  Appropriate, Congruent and Constricted  Thought Process:  Coherent and Goal Directed  Orientation:  Full (Time, Place, and Person)  Thought Content:  Logical  Suicidal Thoughts:  Yes.  without intent/plan  Homicidal Thoughts:  No  Memory:  Immediate;   Good Recent;   Good Remote;   Good  Judgement:  Fair  Insight:  Fair  Psychomotor Activity:  Normal  Concentration:  Concentration: Fair  Recall:  FiservFair  Fund of Knowledge:  Fair  Language:  Fair  Akathisia:  No  Handed:    AIMS (if indicated):     Assets:  Manufacturing systems engineerCommunication Skills Physical Health  ADL's:  Intact  Cognition:  WNL  Sleep:  Number of Hours: 5.25   Treatment Plan Summary: Daily contact with patient to assess and evaluate symptoms and progress in treatment and Medication management. Pt reports improvement overall of his mood symptoms but he continues to have SI without plan, but he is beginning to better utilize  his coping skills that we will begin to assess for possibility of discharge tomorrow. [facility]. He would like to continue his current regimen without changes.  - Continue inpatient hospitalization  Will continue today 11/06/2017 plan as below  except where it is noted.  -Continue Seroquel 200mg  qhs for mood stability -Continue Seroquel 25 mg PO Q6H PRN for agitation -Continue Gabapentin 300 mg PO TID for agitation -Continue Depakote 500 mg PO Q12H for moodstabilization &seizure activities. -Continue Trazodone 50 mg PO QHS PRN for insomnia (pt may repeat x1) -Continue Vistaril 50 mg PO TID PRN for anxiety. -Continue Nicotine patch 21 mg Q 24 hours for smoking cessation.  -Encourage group therapy participation - Discharge planning will be ongoing  Sanjuana Kava, NP, PMHNP, FNP-BC. 11/06/2017, 3:03 PMPatient ID: Nathaniel Warren, male   DOB: Jul 05, 1966, 51 y.o.   MRN: [id number]

## 2017-11-06 NOTE — Progress Notes (Signed)
  [facility] :  Will you be returning to the same living situation after discharge:  No. Pt has been accepted to [facility] at discharge.  At discharge, do you have transportation home?: Yes,  pt's girlfriend will pick him up Saturday afternoon and transport him to halfway house. Discharge date 11/07/17 Do you have the ability to pay for your medications: Yes,  mental health  Release of information consent forms completed and submitted to medical records by CSW.  Patient to Follow up at: Follow-up Information    [facility]. Go on 11/10/2017.   Specialty:  Behavioral Health Why:  Hospital follow-up/assessment for ACT services on Tuesday at 8:45AM. Please bring hospital discharge paperwork with medication list to this appt. Thank you.  Contact information: [address] [place] [address] [phone]           Next level of care provider has access to [facility] Link:no  Safety Planning and Suicide Prevention discussed: Yes,  SPE completed with pt; pt declined to consent to family contact. SPI pamphlet and Mobile Crisis information provided to pt.   Have you used any form of tobacco in the last 30 days? (Cigarettes, Smokeless Tobacco, Cigars, and/or Pipes): Yes  Has patient been referred to the Quitline?: Patient refused referral  Patient has been referred for addiction treatment: Yes  Pulte HomesHeather N Smart, LCSW 11/06/2017, 11:54 AM

## 2017-11-06 NOTE — Progress Notes (Signed)
D Patient ( Nathaniel Warren as he requests this Clinical research associatewriter to call him) is seen OOB UAL on the 300 hall today..he tolerates this fairly well. HE experienced a period of intense agitation, came to this nurse and received prn po vistaril ( and he said 1 hr later " that helped me.Marland Kitchen.Marland Kitchen.I feel better".  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 R Safety is in place. Pt reports he is scheudled to be discharged tomorrow and support offered.

## 2017-11-06 NOTE — Tx Team (Signed)
Interdisciplinary Treatment and Diagnostic Plan Update  11/06/2017 Time of Session: 0830AM Nathaniel SpatesJames T Warren MRN: [id number]  Principal Diagnosis: MDD, recurrent, severe without psychosis   Secondary Diagnoses: Principal Problem:   MDD (major depressive disorder), recurrent severe, without psychosis (HCC) Active Problems:   GAD (generalized anxiety disorder)   Current Medications:  Current Facility-Administered Medications  Medication Dose Route Frequency Provider Last Rate Last Dose  . acetaminophen (TYLENOL) tablet 650 mg  650 mg Oral Q6H PRN Okonkwo, Justina A, NP   650 mg at 10/30/17 1025  . alum & mag hydroxide-simeth (MAALOX/MYLANTA) 200-200-20 MG/5ML suspension 30 mL  30 mL Oral Q4H PRN Okonkwo, Justina A, NP   30 mL at 11/02/17 0419  . divalproex (DEPAKOTE) DR tablet 500 mg  500 mg Oral Q12H Oneta RackLewis, Tanika N, NP   500 mg at 11/05/17 2205  . gabapentin (NEURONTIN) capsule 300 mg  300 mg Oral TID Georgiann CockerIzediuno, Vincent A, MD   300 mg at 11/05/17 1632  . hydrOXYzine (ATARAX/VISTARIL) tablet 50 mg  50 mg Oral TID PRN Micheal Likensainville, Christopher T, MD   50 mg at 11/05/17 2205  . LORazepam (ATIVAN) tablet 1 mg  1 mg Oral Q6H PRN Oneta RackLewis, Tanika N, NP   1 mg at 11/02/17 0752  . magnesium hydroxide (MILK OF MAGNESIA) suspension 30 mL  30 mL Oral Daily PRN Okonkwo, Justina A, NP      . nicotine (NICODERM CQ - dosed in mg/24 hours) patch 21 mg  21 mg Transdermal Daily Izediuno, Delight OvensVincent A, MD   21 mg at 11/05/17 0856  . QUEtiapine (SEROQUEL) tablet 200 mg  200 mg Oral QHS Micheal Likensainville, Christopher T, MD   200 mg at 11/05/17 2205  . QUEtiapine (SEROQUEL) tablet 25 mg  25 mg Oral Q6H PRN Micheal Likens[place], Christopher T, MD   25 mg at 11/05/17 1135  . traZODone (DESYREL) tablet 50 mg  50 mg Oral QHS PRN Micheal Likens[place]ville, Christopher T, MD   50 mg at 11/05/17 2205   PTA Medications: Medications Prior to Admission  Medication Sig Dispense Refill Last Dose  . albuterol (PROVENTIL HFA;VENTOLIN HFA) 108 (90 Base) MCG/ACT  inhaler Inhale 2 puffs into the lungs every 4 (four) hours as needed for wheezing. 1 Inhaler 0   . busPIRone (BUSPAR) 10 MG tablet Take 1 tablet (10 mg total) by mouth 3 (three) times daily. For anxiety symptoms 90 tablet 0   . clopidogrel (PLAVIX) 75 MG tablet Take 1 tablet (75 mg total) by mouth daily. 30 tablet 0   . divalproex (DEPAKOTE) 500 MG DR tablet Take 1 tablet (500 mg total) by mouth every 12 (twelve) hours.     . folic acid (FOLVITE) 1 MG tablet Take 1 tablet (1 mg total) by mouth daily.     . hydrOXYzine (ATARAX/VISTARIL) 50 MG tablet Take 1 tablet (50 mg total) by mouth 4 (four) times daily as needed for anxiety. 30 tablet 0   . ibuprofen (ADVIL,MOTRIN) 200 MG tablet Take 3 tablets (600 mg total) by mouth every 6 (six) hours as needed for mild pain. 30 tablet 0   . lithium carbonate 300 MG capsule Take 2 capsules (600 mg total) by mouth at bedtime.     . Multiple Vitamin (MULTIVITAMIN WITH MINERALS) TABS tablet Take 1 tablet by mouth daily.     . nicotine (NICODERM CQ - DOSED IN MG/24 HOURS) 21 mg/24hr patch Place 1 patch (21 mg total) onto the skin daily. 28 patch 0   . QUEtiapine (SEROQUEL) 25 MG  tablet Take 1 tablet (25 mg total) by mouth 2 (two) times daily. FOR AGITATION, ANXIETY, AND INSOMNIA     . QUEtiapine (SEROQUEL) 300 MG tablet Take 1 tablet (300 mg total) by mouth at bedtime.     . thiamine 100 MG tablet Take 1 tablet (100 mg total) by mouth daily.     . traZODone (DESYREL) 100 MG tablet Take 1 tablet (100 mg total) by mouth at bedtime as needed (ANXIETY).       Patient Stressors: Medication change or noncompliance Substance abuse  Patient Strengths: Ability for insight Active sense of humor Average or above average intelligence Capable of independent living General fund of knowledge Motivation for treatment/growth  Treatment Modalities: Medication Management, Group therapy, Case management,  1 to 1 session with clinician, Psychoeducation, Recreational  therapy.   Physician Treatment Plan for Primary Diagnosis: MDD, recurrent, severe without psychosis  Long Term Goal(s): Improvement in symptoms so as ready for discharge Improvement in symptoms so as ready for discharge   Short Term Goals: Ability to identify changes in lifestyle to reduce recurrence of condition will improve Ability to verbalize feelings will improve Ability to disclose and discuss suicidal ideas Ability to identify and develop effective coping behaviors will improve Compliance with prescribed medications will improve Ability to identify triggers associated with substance abuse/mental health issues will improve  Medication Management: Evaluate patient's response, side effects, and tolerance of medication regimen.  Therapeutic Interventions: 1 to 1 sessions, Unit Group sessions and Medication administration.  Evaluation of Outcomes: Progressing  Physician Treatment Plan for Secondary Diagnosis: Principal Problem:   MDD (major depressive disorder), recurrent severe, without psychosis (HCC) Active Problems:   GAD (generalized anxiety disorder)  Long Term Goal(s): Improvement in symptoms so as ready for discharge Improvement in symptoms so as ready for discharge   Short Term Goals: Ability to identify changes in lifestyle to reduce recurrence of condition will improve Ability to verbalize feelings will improve Ability to disclose and discuss suicidal ideas Ability to identify and develop effective coping behaviors will improve Compliance with prescribed medications will improve Ability to identify triggers associated with substance abuse/mental health issues will improve     Medication Management: Evaluate patient's response, side effects, and tolerance of medication regimen.  Therapeutic Interventions: 1 to 1 sessions, Unit Group sessions and Medication administration.  Evaluation of Outcomes: Progressing  RN Treatment Plan for Primary Diagnosis: MDD,  recurrent, severe without psychosis  Long Term Goal(s): Knowledge of disease and therapeutic regimen to maintain health will improve  Short Term Goals: Ability to remain free from injury will improve, Ability to verbalize feelings will improve and Ability to disclose and discuss suicidal ideas  Medication Management: RN will administer medications as ordered by provider, will assess and evaluate patient's response and provide education to patient for prescribed medication. RN will report any adverse and/or side effects to prescribing provider.  Therapeutic Interventions: 1 on 1 counseling sessions, Psychoeducation, Medication administration, Evaluate responses to treatment, Monitor vital signs and CBGs as ordered, Perform/monitor CIWA, COWS, AIMS and Fall Risk screenings as ordered, Perform wound care treatments as ordered.  Evaluation of Outcomes: Progressing  LCSW Treatment Plan for Primary Diagnosis: MDD, recurrent, severe without psychosis  Long Term Goal(s): Safe transition to appropriate next level of care at discharge, Engage patient in therapeutic group addressing interpersonal concerns.  Short Term Goals: Engage patient in aftercare planning with referrals and resources, Facilitate acceptance of mental health diagnosis and concerns and Facilitate patient progression through stages of change regarding  substance use diagnoses and concerns  Therapeutic Interventions: Assess for all discharge needs, 1 to 1 time with Child psychotherapist, Explore available resources and support systems, Assess for adequacy in community support network, Educate family and significant other(s) on suicide prevention, Complete Psychosocial Assessment, Interpersonal group therapy.  Evaluation of Outcomes: Progressing  Progress in Treatment: Attending groups: Yes Participating in groups: Yes Taking medication as prescribed: Yes. Toleration medication: Yes. Family/Significant other contact made: SPE completed with pt;  pt declined to consent to family contact.  Patient understands diagnosis: Yes. Discussing patient identified problems/goals with staff: Yes. Medical problems stabilized or resolved: Yes. Denies suicidal/homicidal ideation: yes, per self report.  Issues/concerns per patient self-inventory: No. Other: n/a  New problem(s) identified: No, Describe:  n/a  New Short Term/Long Term Goal(s): medication management for detox/mood stabilization; development of comprehensive mental wellness/sobriety plan.   Discharge Plan or Barriers: Pt has interview with Winferd Humphrey ([facility]) this afternoon for possible bed. [facility] for ACT referral and outpatient mental health follow-up. Pt not interested in residential treatment Given [facility] house List and boarding house/extended stay hotel list. [facility] pamphlet and AA/NA list provided for additional community supports.   Reason for Continuation of Hospitalization: Anxiety Medication stabilization   Estimated Length of Stay: Saturday, 11/07/17  Attendees: Patient: 11/06/2017 8:46 AM  Physician: Dr. Jama Flavors MD; Dr. Altamese [place] MD 11/06/2017 8:46 AM  Nursing: Linus Mako RN 11/06/2017 8:46 AM  RN Care Manager: Onnie Boer CM 11/06/2017 8:46 AM  Social Worker: Trula Slade, LCSW 11/06/2017 8:46 AM  Recreational Therapist: x 11/06/2017 8:46 AM  Other: Armandina Stammer NP; Feliz Beam Money NP 11/06/2017 8:46 AM  Other:  11/06/2017 8:46 AM  Other: 11/06/2017 8:46 AM    Scribe for Treatment Team: Ledell Peoples Smart, LCSW 11/06/2017 8:46 AM

## 2017-11-07 MED ORDER — QUETIAPINE FUMARATE 25 MG PO TABS: 25.0000 mg | ORAL_TABLET | Freq: Four times a day (QID) | ORAL | 0 refills | Status: DC | PRN

## 2017-11-07 MED ORDER — DIVALPROEX SODIUM 500 MG PO DR TAB: 500.0000 mg | DELAYED_RELEASE_TABLET | Freq: Two times a day (BID) | ORAL | 0 refills | Status: DC

## 2017-11-07 MED ORDER — QUETIAPINE FUMARATE 200 MG PO TABS: 200.0000 mg | ORAL_TABLET | Freq: Every day | ORAL | 0 refills | Status: DC

## 2017-11-07 MED ORDER — HYDROXYZINE HCL 50 MG PO TABS: 50.0000 mg | ORAL_TABLET | Freq: Three times a day (TID) | ORAL | 0 refills | Status: DC | PRN

## 2017-11-07 MED ORDER — TRAZODONE HCL 50 MG PO TABS: 50.0000 mg | ORAL_TABLET | Freq: Every evening | ORAL | 0 refills | Status: DC | PRN

## 2017-11-07 MED ORDER — GABAPENTIN 300 MG PO CAPS: 300.0000 mg | ORAL_CAPSULE | Freq: Three times a day (TID) | ORAL | 0 refills | Status: DC

## 2017-11-07 NOTE — Progress Notes (Signed)
Pt is prepared by this Clinical research associatewriter for his dc. HE completed his daily assessment this morning and on this he wrote he denied SI today and he rated his depression , hopelessness and anxeity " 7/5/9    ", respectively. Pt is given dc instrucitons and after they are reviewed with him, cc of instrucitons given to him, along with prescriptions and sample meds. ALl belongings returned to pt and he is escorted to bldg entrance and dc'd . Pt given 2 bus tickets from SW per his request.
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
## 2017-11-07 NOTE — [facility] Suicide Risk Assessment (Signed)
[facility]   Principal Problem: MDD (major depressive disorder), recurrent severe, without psychosis (HCC) Discharge Diagnoses:  Patient Active Problem List   Diagnosis Date Noted  . Multiple lacunar infarcts [I63.81] 10/21/2017  . Drug overdose, intentional, initial encounter (HCC) [T50.902A] 10/20/2017  . Drug overdose, intentional self-harm, initial encounter (HCC) [T50.902A] 10/20/2017  . Seizures (HCC) [R56.9] 10/20/2017  . Drug overdose [T50.901A] 10/20/2017  . Alcohol dependence (HCC) [F10.20] 06/19/2013    Class: Acute  . MDD (major depressive disorder), recurrent severe, without psychosis (HCC) [F33.2] 04/10/2013    Class: Acute  . GAD (generalized anxiety disorder) [F41.1] 04/10/2013  . Depression [F32.9] 01/11/2013  . Anxiety [F41.9] 01/11/2013    Total Time spent with patient: 30 minutes  Musculoskeletal: Strength & Muscle Tone: within normal limits Gait & Station: normal Patient leans: N/A  Psychiatric Specialty Exam: ROS mild headache, no chest pain, no shortness of breath, no vomiting, no diarrhea  Blood pressure 115/62, pulse 78, temperature 98 F (36.7 C), temperature source Oral, resp. rate 16, height 5\' 9"  (1.753 m), weight 83.9 kg (185 lb).Body mass index is 27.32 kg/m.  General Appearance: Well Groomed  Eye Contact::  Good  Speech:  Normal Rate409  Volume:  Normal  Mood:  improved   Affect:  Appropriate and reactive  Thought Process:  Linear and Descriptions of Associations: Intact  Orientation:  Other:  fully alert and attentive  Thought Content:  no hallucinations, no delusions, not internally preoccupied   Suicidal Thoughts:  No denies any suicidal or self injurious ideations, denies homicidal or violent ideations  Homicidal Thoughts:  No  Memory:  recent and remote grossly intact   Judgement:  Other:  improved   Insight:  improved  Psychomotor Activity:  Normal  Concentration:  Good  Recall:  Good  Fund of  Knowledge:Good  Language: Good  Akathisia:  Negative  Handed:  Right  AIMS (if indicated):   no abnormal or involuntary movements noted or reported   Assets:  Communication Skills Desire for Improvement Resilience  Sleep:  Number of Hours: 5.5  Cognition: WNL  ADL's:  Intact   Mental Status Per Nursing Assessment::   On Admission:     Demographic Factors:  51 year old single male, has two adult children  Loss Factors: Homelessness, unemployment, loss of transportation   Historical Factors: History of depression, history of suicide attempts, history of alcohol dependence   Risk Reduction Factors:   Sense of responsibility to family, Positive social support and Positive coping skills or problem solving skills  Continued Clinical Symptoms:  At this time patient is alert, attentive, calm , denies symptoms of WDL, and Vital Signs are WNL. Mood is improved, affect is appropriate, no thought disorder, no suicidal or self injurious ideations, no homicidal ideations, no psychotic symptoms, future oriented Denies medication side effects Behavior on unit in good control, pleasant on approach  Cognitive Features That Contribute To Risk:  No gross cognitive deficits noted upon discharge. Is alert , attentive, and oriented x 3     Suicide Risk:  Mild:  Suicidal ideation of limited frequency, intensity, duration, and specificity.  There are no identifiable plans, no associated intent, mild dysphoria and related symptoms, good self-control (both objective and subjective assessment), few other risk factors, and identifiable protective factors, including available and accessible social support.  Follow-up Information    [facility]. Go on 11/10/2017.   Specialty:  Behavioral Health Why:  Hospital follow-up/assessment for ACT services on Tuesday at 8:45AM. Please  bring hospital discharge paperwork with medication list to this appt. Thank you.  Contact information: [address] [place]  [id number] [phone]           Plan Of Care/Follow-up recommendations:  Activity:  as tolerated  Diet:  Regular Tests:  NA Other:  See below  Patient expresses readiness for discharge and is discharging in good spirits . Plans to go [facility] [employer]. Follow up as above .   Craige CottaFernando A Jasminemarie Sherrard, MD 11/07/2017, 10:55 AM

## 2017-11-07 NOTE — [facility] Group Notes (Signed)
[facility] Group Notes: (Clinical Social Work)   11/07/2017      Type of Therapy:  Group Therapy   Participation Level:  Did Not Attend - already discharged   Ambrose MantleMareida Grossman-Orr, LCSW 11/07/2017, 3:42 PM

## 2018-01-26 ENCOUNTER — Encounter (HOSPITAL_COMMUNITY): Payer: Self-pay

## 2018-01-26 ENCOUNTER — Emergency Department (HOSPITAL_COMMUNITY): Payer: Self-pay

## 2018-01-26 ENCOUNTER — Other Ambulatory Visit: Payer: Self-pay

## 2018-01-26 ENCOUNTER — Encounter (HOSPITAL_COMMUNITY): Payer: Self-pay | Admitting: Emergency Medicine

## 2018-01-26 ENCOUNTER — Emergency Department (HOSPITAL_COMMUNITY)
Admission: EM | Admit: 2018-01-26 | Discharge: 2018-01-26 | Disposition: A | Discharge status: Home / Self Care | Payer: Self-pay | Attending: Emergency Medicine | Admitting: Emergency Medicine

## 2018-01-26 DIAGNOSIS — Z5321 Procedure and treatment not carried out due to patient leaving prior to being seen by health care provider: Secondary | ICD-10-CM | POA: Insufficient documentation

## 2018-01-26 DIAGNOSIS — Z79899 Other long term (current) drug therapy: Secondary | ICD-10-CM | POA: Insufficient documentation

## 2018-01-26 DIAGNOSIS — R079 Chest pain, unspecified: Secondary | ICD-10-CM | POA: Insufficient documentation

## 2018-01-26 DIAGNOSIS — R0602 Shortness of breath: Secondary | ICD-10-CM | POA: Insufficient documentation

## 2018-01-26 DIAGNOSIS — J209 Acute bronchitis, unspecified: Secondary | ICD-10-CM | POA: Insufficient documentation

## 2018-01-26 DIAGNOSIS — R05 Cough: Secondary | ICD-10-CM | POA: Insufficient documentation

## 2018-01-26 DIAGNOSIS — F17210 Nicotine dependence, cigarettes, uncomplicated: Secondary | ICD-10-CM | POA: Insufficient documentation

## 2018-01-26 LAB — CBC
HCT: 49.6 % (ref 39.0–52.0)
Hemoglobin: 17 g/dL (ref 13.0–17.0)
MCH: 33.5 pg (ref 26.0–34.0)
MCHC: 34.3 g/dL (ref 30.0–36.0)
MCV: 97.8 fL (ref 78.0–100.0)
PLATELETS: 182 10*3/uL (ref 150–400)
RBC: 5.07 MIL/uL (ref 4.22–5.81)
RDW: 16 % — AB (ref 11.5–15.5)
WBC: 6.9 10*3/uL (ref 4.0–10.5)

## 2018-01-26 LAB — BASIC METABOLIC PANEL
Anion gap: 13 (ref 5–15)
BUN: 8 mg/dL (ref 6–20)
CALCIUM: 8.6 mg/dL — AB (ref 8.9–10.3)
CO2: 22 mmol/L (ref 22–32)
CREATININE: 0.82 mg/dL (ref 0.61–1.24)
Chloride: 103 mmol/L (ref 101–111)
GFR calc non Af Amer: >60 mL/min (ref >60)
GLUCOSE: 93 mg/dL (ref 65–99)
Potassium: 4.1 mmol/L (ref 3.5–5.1)
Sodium: 138 mmol/L (ref 135–145)

## 2018-01-26 LAB — I-STAT TROPONIN, ED: TROPONIN I, POC: 0 ng/mL (ref 0.00–0.08)

## 2018-01-26 NOTE — ED Triage Notes (Signed)
Pt reports a non-productive cough since Friday.  He reports he has been coughing so much that his back now hurts.  He was here earlier, had labs,Xray but had to leave.

## 2018-01-26 NOTE — ED Notes (Addendum)
Patient not answering for room or vital signs recheck - not seen in lobby. RN called patient's cell phone, no answer.

## 2018-01-26 NOTE — ED Triage Notes (Signed)
Pt states that since Wed he has had body aches, coughing dry, SOB.

## 2018-01-26 NOTE — ED Notes (Signed)
Called pt no answer °

## 2018-01-26 NOTE — ED Triage Notes (Signed)
Patient did not answer when called for room x 2.  

## 2018-01-27 ENCOUNTER — Emergency Department (HOSPITAL_COMMUNITY)
Admission: EM | Admit: 2018-01-27 | Discharge: 2018-01-27 | Disposition: A | Discharge status: Home / Self Care | Payer: Self-pay | Attending: Emergency Medicine | Admitting: Emergency Medicine

## 2018-01-27 DIAGNOSIS — R059 Cough, unspecified: Secondary | ICD-10-CM

## 2018-01-27 DIAGNOSIS — J209 Acute bronchitis, unspecified: Secondary | ICD-10-CM

## 2018-01-27 DIAGNOSIS — R05 Cough: Secondary | ICD-10-CM

## 2018-01-27 MED ORDER — PREDNISONE 20 MG PO TABS: 40.0000 mg | ORAL_TABLET | Freq: Every day | ORAL | 0 refills | Status: DC

## 2018-01-27 MED ORDER — BENZONATATE 100 MG PO CAPS
200.0000 mg | ORAL_CAPSULE | Freq: Once | ORAL | Status: AC
  Administered 2018-01-27: 200 mg via ORAL
  Filled 2018-01-27: qty 2

## 2018-01-27 MED ORDER — IPRATROPIUM-ALBUTEROL 0.5-2.5 (3) MG/3ML IN SOLN
3.0000 mL | Freq: Once | RESPIRATORY_TRACT | Status: AC
  Administered 2018-01-27: 3 mL via RESPIRATORY_TRACT
  Filled 2018-01-27: qty 3

## 2018-01-27 MED ORDER — ALBUTEROL SULFATE HFA 108 (90 BASE) MCG/ACT IN AERS
2.0000 | INHALATION_SPRAY | Freq: Once | RESPIRATORY_TRACT | Status: AC
  Administered 2018-01-27: 2 via RESPIRATORY_TRACT
  Filled 2018-01-27: qty 6.7

## 2018-01-27 MED ORDER — BENZONATATE 100 MG PO CAPS: 100.0000 mg | ORAL_CAPSULE | Freq: Three times a day (TID) | ORAL | 0 refills | Status: DC | PRN

## 2018-01-27 MED ORDER — PREDNISONE 20 MG PO TABS
60.0000 mg | ORAL_TABLET | Freq: Once | ORAL | Status: AC
  Administered 2018-01-27: 60 mg via ORAL
  Filled 2018-01-27: qty 3

## 2018-01-27 NOTE — ED Provider Notes (Signed)
[facility] EMERGENCY DEPARTMENT Provider Note   CSN: [id number] Arrival date & time: 01/26/18  2216     History   Chief Complaint Chief Complaint  Patient presents with  . Cough    HPI Nathaniel Warren is a 52 y.o. male.  52 year old male with history of depression, seizures, bipolar 1 disorder presents to the emergency department for cough.  He reports worsening cough over the past 5 days.  Cough is dry, hacking, nonproductive.  He reports worsening pain in his chest and back with coughing, deep breathing.  Patient also endorsing intermittent wheezing.  He has been around other individuals in his home that have been sick with flulike illness.  He has not had any known fevers, but does report subjective chills at times.  No medications taken prior to arrival for symptoms.  No vomiting, diarrhea, ACE-I use.   The history is provided by the patient. No language interpreter was used.  Cough     Past Medical History:  Diagnosis Date  . Bipolar 1 disorder (HCC)   . Depression   . Seizures (HCC)   . Stroke [facility])     Patient Active Problem List   Diagnosis Date Noted  . Multiple lacunar infarcts 10/21/2017  . Drug overdose, intentional, initial encounter (HCC) 10/20/2017  . Drug overdose, intentional self-harm, initial encounter (HCC) 10/20/2017  . Seizures (HCC) 10/20/2017  . Drug overdose 10/20/2017  . Alcohol dependence (HCC) 06/19/2013    Class: Acute  . MDD (major depressive disorder), recurrent severe, without psychosis (HCC) 04/10/2013    Class: Acute  . GAD (generalized anxiety disorder) 04/10/2013  . Depression 01/11/2013  . Anxiety 01/11/2013    Past Surgical History:  Procedure Laterality Date  . ANKLE SURGERY    . SPLENECTOMY         Home Medications    Prior to Admission medications   Medication Sig Start Date End Date Taking? Authorizing Provider  benzonatate (TESSALON) 100 MG capsule Take 1-2 capsules (100-200 mg total) by  mouth 3 (three) times daily as needed for cough. 01/27/18   Antony Madura, PA-C  divalproex (DEPAKOTE) 500 MG DR tablet Take 1 tablet (500 mg total) by mouth every 12 (twelve) hours. For mood control 11/07/17   Money, Gerlene Burdock, FNP  gabapentin (NEURONTIN) 300 MG capsule Take 1 capsule (300 mg total) by mouth 3 (three) times daily. For withdrawals 11/07/17   Money, Gerlene Burdock, FNP  hydrOXYzine (ATARAX/VISTARIL) 50 MG tablet Take 1 tablet (50 mg total) by mouth 3 (three) times daily as needed for anxiety. 11/07/17   Money, Gerlene Burdock, FNP  predniSONE (DELTASONE) 20 MG tablet Take 2 tablets (40 mg total) by mouth daily. 01/27/18   Antony Madura, PA-C  QUEtiapine (SEROQUEL) 200 MG tablet Take 1 tablet (200 mg total) by mouth at bedtime. For mood control 11/07/17   Money, Gerlene Burdock, FNP  QUEtiapine (SEROQUEL) 25 MG tablet Take 1 tablet (25 mg total) by mouth every 6 (six) hours as needed (severe anxiety/panic/agitation). 11/07/17   Money, Gerlene Burdock, FNP  traZODone (DESYREL) 50 MG tablet Take 1 tablet (50 mg total) by mouth at bedtime as needed for sleep. 11/07/17   Money, Gerlene Burdock, FNP    Family History Family History  Problem Relation Age of Onset  . Hypertension Other     Social History Social History   Tobacco Use  . Smoking status: Current Every Day Smoker    Packs/day: 1.00    Years: 30.00  Pack years: 30.00    Types: Cigarettes  . Smokeless tobacco: Never Used  Substance Use Topics  . Alcohol use: Yes    Comment: was drinking most days before being hospitalized  . Drug use: Yes    Types: Benzodiazepines    Comment: teenager used THC, only one occasion     Allergies   Asa [aspirin]   Review of Systems Review of Systems  Respiratory: Positive for cough.   Ten systems reviewed and are negative for acute change, except as noted in the HPI.    Physical Exam Updated Vital Signs BP 111/85 (BP Location: Right Arm)   Pulse 95   Temp 97.8 F (36.6 C) (Oral)   Resp 18   Ht 5\' 9"  (1.753 m)    Wt 79.4 kg (175 lb)   SpO2 98%   BMI 25.84 kg/m   Physical Exam  Constitutional: He is oriented to person, place, and time. He appears well-developed and well-nourished. No distress.  Nontoxic appearing and in no acute distress  HENT:  Head: Normocephalic and atraumatic.  Eyes: Conjunctivae and EOM are normal. No scleral icterus.  Neck: Normal range of motion.  Cardiovascular: Normal rate, regular rhythm and intact distal pulses.  Pulmonary/Chest: Effort normal. No stridor. No respiratory distress. He has no wheezes.  Dry, hacking cough which is spastic and worse on deep inspiration.  Lung sounds clear bilaterally.  No signs of respiratory distress.  Musculoskeletal: Normal range of motion.  Neurological: He is alert and oriented to person, place, and time. He exhibits normal muscle tone. Coordination normal.  Ambulatory with steady gait  Skin: Skin is warm and dry. No rash noted. He is not diaphoretic. No erythema. No pallor.  Psychiatric: He has a normal mood and affect. His behavior is normal.  Nursing note and vitals reviewed.    ED Treatments / Results  Labs (all labs ordered are listed, but only abnormal results are displayed) Labs Reviewed - No data to display  EKG  EKG Interpretation None       Radiology Dg Chest 2 View  Result Date: 01/26/2018 CLINICAL DATA:  Cough and chest pain. EXAM: CHEST  2 VIEW COMPARISON:  09/18/2017 FINDINGS: Chronic hyperinflation and interstitial coarsening. Normal heart size and mediastinal contours. Calcified granuloma in the right lung, unchanged. No consolidation. No pleural fluid or pneumothorax. No acute osseous abnormalities. IMPRESSION: Chronic hyperinflation and interstitial coarsening. No superimposed acute abnormality. Electronically Signed   By: Rubye Oaks M.D.   On: 01/26/2018 06:40    Procedures Procedures (including critical care time)  Medications Ordered in ED Medications  ipratropium-albuterol (DUONEB)  0.5-2.5 (3) MG/3ML nebulizer solution 3 mL (not administered)  benzonatate (TESSALON) capsule 200 mg (not administered)  predniSONE (DELTASONE) tablet 60 mg (not administered)  albuterol (PROVENTIL HFA;VENTOLIN HFA) 108 (90 Base) MCG/ACT inhaler 2 puff (not administered)    12:45 AM Patient PERC negative; doubt PE.   Initial Impression / Assessment and Plan / ED Course  I have reviewed the triage vital signs and the nursing notes.  Pertinent labs & imaging results that were available during my care of the patient were reviewed by me and considered in my medical decision making (see chart for details).     Pt CXR negative for acute infiltrate. Patients symptoms are consistent with URI, likely viral bronchitis. Discussed that antibiotics are not indicated for viral infections. Patient will be discharged with symptomatic treatment.  He verbalizes understanding and is agreeable with plan.  Return precautions discussed and provided.  Patient discharged in stable condition with no unaddressed concerns.  Vitals:   01/26/18 2237 01/26/18 2238  BP: 111/85   Pulse: 95   Resp: 18   Temp: 97.8 F (36.6 C)   TempSrc: Oral   SpO2: 98%   Weight:  79.4 kg (175 lb)  Height:  5\' 9"  (1.753 m)    Final Clinical Impressions(s) / ED Diagnoses   Final diagnoses:  Cough  Acute bronchitis, unspecified organism    ED Discharge Orders        Ordered    predniSONE (DELTASONE) 20 MG tablet  Daily     01/27/18 0046    benzonatate (TESSALON) 100 MG capsule  3 times daily PRN     01/27/18 0046       Antony MaduraHumes, Inez Rosato, PA-C 01/27/18 0047    Palumbo, April, MD 01/27/18 [id number]

## 2021-05-28 ENCOUNTER — Encounter (HOSPITAL_COMMUNITY): Payer: Self-pay | Admitting: Emergency Medicine

## 2021-05-28 ENCOUNTER — Other Ambulatory Visit: Payer: Self-pay

## 2021-05-28 ENCOUNTER — Emergency Department (HOSPITAL_COMMUNITY)
Admission: EM | Admit: 2021-05-28 | Discharge: 2021-05-28 | Disposition: A | Discharge status: Home / Self Care | Payer: Self-pay | Attending: Emergency Medicine | Admitting: Emergency Medicine

## 2021-05-28 ENCOUNTER — Emergency Department (HOSPITAL_COMMUNITY): Payer: Self-pay

## 2021-05-28 ENCOUNTER — Encounter (HOSPITAL_COMMUNITY): Payer: Self-pay

## 2021-05-28 DIAGNOSIS — Z79899 Other long term (current) drug therapy: Secondary | ICD-10-CM | POA: Insufficient documentation

## 2021-05-28 DIAGNOSIS — R059 Cough, unspecified: Secondary | ICD-10-CM | POA: Insufficient documentation

## 2021-05-28 DIAGNOSIS — F17210 Nicotine dependence, cigarettes, uncomplicated: Secondary | ICD-10-CM | POA: Insufficient documentation

## 2021-05-28 DIAGNOSIS — K439 Ventral hernia without obstruction or gangrene: Secondary | ICD-10-CM

## 2021-05-28 DIAGNOSIS — R0602 Shortness of breath: Secondary | ICD-10-CM | POA: Insufficient documentation

## 2021-05-28 DIAGNOSIS — R600 Localized edema: Secondary | ICD-10-CM | POA: Insufficient documentation

## 2021-05-28 HISTORY — DX: Unspecified abdominal hernia without obstruction or gangrene: K46.9

## 2021-05-28 LAB — CBC WITH DIFFERENTIAL/PLATELET
Abs Immature Granulocytes: 0.03 10*3/uL (ref 0.00–0.07)
Basophils Absolute: 0 10*3/uL (ref 0.0–0.1)
Basophils Relative: 0 %
Eosinophils Absolute: 0.1 10*3/uL (ref 0.0–0.5)
Eosinophils Relative: 1 %
HCT: 44.8 % (ref 39.0–52.0)
Hemoglobin: 16 g/dL (ref 13.0–17.0)
Immature Granulocytes: 0 %
Lymphocytes Relative: 16 %
Lymphs Abs: 2 10*3/uL (ref 0.7–4.0)
MCH: 36.3 pg — ABNORMAL HIGH (ref 26.0–34.0)
MCHC: 35.7 g/dL (ref 30.0–36.0)
MCV: 101.6 fL — ABNORMAL HIGH (ref 80.0–100.0)
Monocytes Absolute: 0.9 10*3/uL (ref 0.1–1.0)
Monocytes Relative: 7 %
Neutro Abs: 9.7 10*3/uL — ABNORMAL HIGH (ref 1.7–7.7)
Neutrophils Relative %: 76 %
Platelets: 201 10*3/uL (ref 150–400)
RBC: 4.41 MIL/uL (ref 4.22–5.81)
RDW: 14.1 % (ref 11.5–15.5)
WBC: 12.8 10*3/uL — ABNORMAL HIGH (ref 4.0–10.5)
nRBC: 0 % (ref 0.0–0.2)

## 2021-05-28 LAB — URINALYSIS, ROUTINE W REFLEX MICROSCOPIC
Bilirubin Urine: NEGATIVE
Glucose, UA: NEGATIVE mg/dL
Hgb urine dipstick: NEGATIVE
Ketones, ur: 20 mg/dL — AB
Leukocytes,Ua: NEGATIVE
Nitrite: NEGATIVE
Protein, ur: NEGATIVE mg/dL
Specific Gravity, Urine: 1.019 (ref 1.005–1.030)
pH: 6 (ref 5.0–8.0)

## 2021-05-28 LAB — COMPREHENSIVE METABOLIC PANEL
ALT: 32 U/L (ref 0–44)
AST: 146 U/L — ABNORMAL HIGH (ref 15–41)
Albumin: 2.8 g/dL — ABNORMAL LOW (ref 3.5–5.0)
Alkaline Phosphatase: 234 U/L — ABNORMAL HIGH (ref 38–126)
Anion gap: 7 (ref 5–15)
BUN: <5 mg/dL — ABNORMAL LOW (ref 6–20)
CO2: 27 mmol/L (ref 22–32)
Calcium: 8.5 mg/dL — ABNORMAL LOW (ref 8.9–10.3)
Chloride: 105 mmol/L (ref 98–111)
Creatinine, Ser: 0.62 mg/dL (ref 0.61–1.24)
GFR, Estimated: >60 mL/min (ref >60)
Glucose, Bld: 96 mg/dL (ref 70–99)
Potassium: 4.8 mmol/L (ref 3.5–5.1)
Sodium: 139 mmol/L (ref 135–145)
Total Bilirubin: 2 mg/dL — ABNORMAL HIGH (ref 0.3–1.2)
Total Protein: 7.8 g/dL (ref 6.5–8.1)

## 2021-05-28 LAB — TROPONIN I (HIGH SENSITIVITY): Troponin I (High Sensitivity): 4 ng/L (ref <18)

## 2021-05-28 LAB — BRAIN NATRIURETIC PEPTIDE: B Natriuretic Peptide: 135.6 pg/mL — ABNORMAL HIGH (ref 0.0–100.0)

## 2021-05-28 MED ORDER — FUROSEMIDE 20 MG PO TABS: 20.0000 mg | ORAL_TABLET | Freq: Every day | ORAL | 0 refills | Status: DC

## 2021-05-28 NOTE — Discharge Instructions (Addendum)
  There were some liver lab abnormalities.  Follow-up with gastroenterology on this matter.  Call to make an appointment.  For the lower extremity swelling, take the Lasix daily.  This will increase urination. We recommend follow-up with cardiology for further evaluation of the cause of the lower extremity edema.  For the hernia, we recommend follow-up with general surgery.  There were some abnormalities on the lab work, such as low albumin.  Overall follow-up recommended with a primary care provider.

## 2021-05-28 NOTE — ED Triage Notes (Signed)
Patient c/o bilateral lower extremity edema x 2 days. SOB, cough, but no fever. Patient also c/o mid abdominal hernia with pain. Patient states he had a normal BM today, but pain increased.

## 2021-05-28 NOTE — ED Provider Notes (Signed)
[place] DEPT Provider Note   CSN: [id number] Arrival date & time: 05/28/21  1000     History Chief Complaint  Patient presents with   Leg Swelling   Shortness of Breath   Cough   hernia pain    Nathaniel Warren is a 55 y.o. male.  The history is provided by the patient.      Nathaniel Warren is a 55 y.o. male, with a history of abdominal hernia, presenting to the ED with shortness of breath and lower extremity edema bilaterally over the past couple days. States he has not had this issue before.  Nonproductive cough. Also notes protrusion of incisional hernia of the abdomen intermittently for the last few weeks.  He states he has had a known hernia in the area for several years, but over the last several weeks he has noted protrusion of the hernia with coughing or changes in position.   He continues to eat regularly and have regular bowel movements, last bowel movement was this morning and was normal. He denies fever/chills, chest pain, orthopnea, dizziness, syncope, hematochezia/melena, persistent abdominal pain, changes in urination, N/V/D, or any other complaints.    Past Medical History:  Diagnosis Date   Hernia, abdominal     There are no problems to display for this patient.   Past Surgical History:  Procedure Laterality Date   ANKLE SURGERY Right    SPLENECTOMY         Family History  Problem Relation Age of Onset   COPD Mother    Diabetes Father     Social History   Tobacco Use   Smoking status: Every Day    Packs/day: 1.00    Pack years: 0.00    Types: Cigarettes   Smokeless tobacco: Former  Scientific laboratory technician Use: Never used  Substance Use Topics   Alcohol use: Yes    Comment: 1/2 pint of vodka daily   Drug use: Yes    Types: Marijuana    Home Medications Prior to Admission medications   Medication Sig Start Date End Date Taking? Authorizing Provider  furosemide (LASIX) 20 MG tablet Take 1 tablet (20 mg  total) by mouth daily for 5 days. 05/28/21 06/02/21 Yes Anise Harbin C, PA-C    Allergies    Aspirin  Review of Systems   Review of Systems  Constitutional:  Negative for chills, diaphoresis and fever.  Respiratory:  Positive for cough and shortness of breath.   Cardiovascular:  Positive for leg swelling. Negative for chest pain.  Gastrointestinal:  Negative for abdominal pain, blood in stool, constipation, diarrhea, nausea and vomiting.  Genitourinary:  Negative for difficulty urinating and dysuria.  Musculoskeletal:  Negative for back pain (none acute).  Neurological:  Negative for dizziness, syncope and weakness.  All other systems reviewed and are negative.  Physical Exam Updated Vital Signs BP 112/78 (BP Location: Right Arm)   Pulse 82   Temp 98.3 F (36.8 C) (Oral)   Resp 18   Ht 5' 9"  (1.753 m)   Wt 83 kg   SpO2 96%   BMI 27.02 kg/m   Physical Exam Vitals and nursing note reviewed.  Constitutional:      General: He is not in acute distress.    Appearance: He is well-developed. He is not diaphoretic.  HENT:     Head: Normocephalic and atraumatic.     Mouth/Throat:     Mouth: Mucous membranes are moist.     Pharynx: Oropharynx  is clear.  Eyes:     Conjunctiva/sclera: Conjunctivae normal.  Cardiovascular:     Rate and Rhythm: Normal rate and regular rhythm.     Pulses: Normal pulses.          Radial pulses are 2+ on the right side and 2+ on the left side.       Posterior tibial pulses are 2+ on the right side and 2+ on the left side.     Heart sounds: Normal heart sounds.     Comments: Tactile temperature in the extremities appropriate and equal bilaterally. Pulmonary:     Effort: Pulmonary effort is normal. No respiratory distress.     Breath sounds: Normal breath sounds.     Comments: No increased work of breathing.  Speaks in full sentences without difficulty.  No noted orthopnea. Abdominal:     Palpations: Abdomen is soft.     Tenderness: There is no  abdominal tenderness. There is no guarding.     Comments: Midline incisional hernia just superior to the umbilicus.  Spontaneous reduction.  No noted swelling, tenderness, bruising.  Musculoskeletal:     Cervical back: Neck supple.     Right lower leg: Edema present.     Left lower leg: Edema present.     Comments: Bilateral lower extremity edema in the feet and ankles.  No erythema, increased warmth, calf pain/tenderness.  Skin:    General: Skin is warm and dry.  Neurological:     Mental Status: He is alert.  Psychiatric:        Mood and Affect: Mood and affect normal.        Speech: Speech normal.        Behavior: Behavior normal.    ED Results / Procedures / Treatments   Labs (all labs ordered are listed, but only abnormal results are displayed) Labs Reviewed  COMPREHENSIVE METABOLIC PANEL - Abnormal; Notable for the following components:      Result Value   BUN <5 (*)    Calcium 8.5 (*)    Albumin 2.8 (*)    AST 146 (*)    Alkaline Phosphatase 234 (*)    Total Bilirubin 2.0 (*)    All other components within normal limits  CBC WITH DIFFERENTIAL/PLATELET - Abnormal; Notable for the following components:   WBC 12.8 (*)    MCV 101.6 (*)    MCH 36.3 (*)    Neutro Abs 9.7 (*)    All other components within normal limits  BRAIN NATRIURETIC PEPTIDE - Abnormal; Notable for the following components:   B Natriuretic Peptide 135.6 (*)    All other components within normal limits  URINALYSIS, ROUTINE W REFLEX MICROSCOPIC - Abnormal; Notable for the following components:   Color, Urine AMBER (*)    APPearance HAZY (*)    Ketones, ur 20 (*)    All other components within normal limits  TROPONIN I (HIGH SENSITIVITY)  TROPONIN I (HIGH SENSITIVITY)    EKG EKG Interpretation  Date/Time:  Tuesday May 28 2021 11:46:04 EDT Ventricular Rate:  69 PR Interval:  122 QRS Duration: 100 QT Interval:  464 QTC Calculation: 498 R Axis:   -38 Text Interpretation: Sinus rhythm Probable  left atrial enlargement Left axis deviation RSR' in V1 or V2, right VCD or RVH Borderline prolonged QT interval 12 Lead; Mason-Likar No previous tracing Confirmed by Blanchie Dessert [phone]) on 05/28/2021 2:05:47 PM  Radiology DG Chest 2 View  Result Date: 05/28/2021 CLINICAL DATA:  Shortness of breath.  EXAM: CHEST - 2 VIEW COMPARISON:  01/26/2018. FINDINGS: Mediastinum hilar structures normal. Heart size normal. Punctate calcified pulmonary densities consistent with calcified granulomas. No focal infiltrate. No pleural effusion or pneumothorax. IMPRESSION: No acute cardiopulmonary disease. Electronically Signed   By: Marcello Moores  Register   On: 05/28/2021 11:30    Procedures Procedures   Medications Ordered in ED Medications - No data to display  ED Course  I have reviewed the triage vital signs and the nursing notes.  Pertinent labs & imaging results that were available during my care of the patient were reviewed by me and considered in my medical decision making (see chart for details).    MDM Rules/Calculators/A&P                          Patient presents with lower extremity edema as well as intermittent shortness of breath. Patient is nontoxic appearing, afebrile, not tachycardic, not tachypneic, not hypotensive, maintains excellent SPO2 on room air, and is in no apparent distress.   I have reviewed the patient's chart to obtain more information.   I reviewed and interpreted the patient's labs and radiological studies. No acute abnormalities noted on chest x-ray. Some elevations in his AST, alk phos, and total bilirubin, these can be followed up by GI. He has no abdominal tenderness and no evidence of incarcerated hernia.  He will follow-up with general surgery for further evaluation of this hernia. His albumin is lower than normal.  This could cause swelling.  He will follow-up for further evaluation.  He was overall advised to follow-up with cardiology due to lower extremity  edema.   Findings and plan of care discussed with attending physician, Blanchie Dessert, MD. Dr. Maryan Rued personally evaluated and examined this patient.  Vitals:   05/28/21 1010 05/28/21 1446  BP: 112/78 110/74  Pulse: 82 81  Resp: 18 18  Temp: 98.3 F (36.8 C)   TempSrc: Oral   SpO2: 96% 100%  Weight: 83 kg   Height: 5' 9"  (1.753 m)     Final Clinical Impression(s) / ED Diagnoses Final diagnoses:  Bilateral lower extremity edema  Ventral hernia without obstruction or gangrene    Rx / DC Orders ED Discharge Orders          Ordered    furosemide (LASIX) 20 MG tablet  Daily        05/28/21 1439             Lorayne Bender, PA-C 05/28/21 1518    Blanchie Dessert, MD 06/03/21 [phone]

## 2021-12-16 ENCOUNTER — Emergency Department (HOSPITAL_COMMUNITY)
Admission: EM | Admit: 2021-12-16 | Discharge: 2021-12-16 | Discharge status: Against Medical Advice | Payer: Self-pay | Attending: Emergency Medicine | Admitting: Emergency Medicine

## 2021-12-16 ENCOUNTER — Emergency Department (HOSPITAL_COMMUNITY): Payer: Self-pay

## 2021-12-16 ENCOUNTER — Encounter (HOSPITAL_COMMUNITY): Payer: Self-pay | Admitting: Emergency Medicine

## 2021-12-16 DIAGNOSIS — M7989 Other specified soft tissue disorders: Secondary | ICD-10-CM | POA: Insufficient documentation

## 2021-12-16 DIAGNOSIS — R0602 Shortness of breath: Secondary | ICD-10-CM | POA: Insufficient documentation

## 2021-12-16 DIAGNOSIS — R072 Precordial pain: Secondary | ICD-10-CM | POA: Insufficient documentation

## 2021-12-16 DIAGNOSIS — R079 Chest pain, unspecified: Secondary | ICD-10-CM

## 2021-12-16 DIAGNOSIS — Z5321 Procedure and treatment not carried out due to patient leaving prior to being seen by health care provider: Secondary | ICD-10-CM | POA: Insufficient documentation

## 2021-12-16 LAB — CBC
HCT: 35.1 % — ABNORMAL LOW (ref 39.0–52.0)
Hemoglobin: 13 g/dL (ref 13.0–17.0)
MCH: 36.4 pg — ABNORMAL HIGH (ref 26.0–34.0)
MCHC: 37 g/dL — ABNORMAL HIGH (ref 30.0–36.0)
MCV: 98.3 fL (ref 80.0–100.0)
Platelets: 139 10*3/uL — ABNORMAL LOW (ref 150–400)
RBC: 3.57 MIL/uL — ABNORMAL LOW (ref 4.22–5.81)
RDW: 15.6 % — ABNORMAL HIGH (ref 11.5–15.5)
WBC: 9.6 10*3/uL (ref 4.0–10.5)
nRBC: 0 % (ref 0.0–0.2)

## 2021-12-16 LAB — BASIC METABOLIC PANEL
Anion gap: 13 (ref 5–15)
BUN: <5 mg/dL — ABNORMAL LOW (ref 6–20)
CO2: 24 mmol/L (ref 22–32)
Calcium: 8.8 mg/dL — ABNORMAL LOW (ref 8.9–10.3)
Chloride: 98 mmol/L (ref 98–111)
Creatinine, Ser: 0.63 mg/dL (ref 0.61–1.24)
GFR, Estimated: >60 mL/min (ref >60)
Glucose, Bld: 92 mg/dL (ref 70–99)
Potassium: 3.4 mmol/L — ABNORMAL LOW (ref 3.5–5.1)
Sodium: 135 mmol/L (ref 135–145)

## 2021-12-16 LAB — TROPONIN I (HIGH SENSITIVITY)
Troponin I (High Sensitivity): 8 ng/L (ref <18)
Troponin I (High Sensitivity): 7 ng/L (ref <18)

## 2021-12-16 LAB — BRAIN NATRIURETIC PEPTIDE: B Natriuretic Peptide: 252.7 pg/mL — ABNORMAL HIGH (ref 0.0–100.0)

## 2021-12-16 NOTE — ED Provider Triage Note (Signed)
Emergency Medicine Provider Triage Evaluation Note  Nathaniel Warren , a 56 y.o. male  was evaluated in triage.  Pt complains of shortness of breath with chest pain.  Reports similar episode several months ago, seen with sibling at that time with a diagnosis made.  States that he is having shortness of breath, midsternal chest pressure and swelling of his lower extremities.  Denies orthopnea, diaphoresis.  Patient experienced significant chest pressure yesterday when after exerting himself moving water bottles/groceries out of the car.  States that he was short of breath and slightly nauseous at the time and had to rest with improvement in his symptoms. Is a daily smoker, denies history of hyperlipidemia or hypertension, denies cardiac history  Review of Systems  Positive: Shortness of breath, chest pressure Negative: Abdominal pain  Physical Exam  BP 103/69 (BP Location: Right Arm)    Pulse 67    Temp 97.7 F (36.5 C) (Oral)    Resp 16    SpO2 96%  Gen:   Awake, no distress   Resp:  Normal effort  MSK:   Moves extremities without difficulty  Other:    Medical Decision Making  Medically screening exam initiated at 8:42 AM.  Appropriate orders placed.  [name] was informed that the remainder of the evaluation will be completed by another provider, this initial triage assessment does not replace that evaluation, and the importance of remaining in the ED until their evaluation is complete.     Jeannie Fend, PA-C 12/16/21 [phone]

## 2021-12-16 NOTE — ED Triage Notes (Signed)
Patient here with complaint of shortness of breath, chest pain, and bilateral lower extremity edema that started a few months ago. Patient states he was seen at [facility] a few months ago and was told to follow up with cardiology but patient states he has so far been unable to find a cardiologist that will see him without having insurance. Patient alert, oriented, ambulatory, speaking in complete sentences, and in no apparent distress at this time.

## 2021-12-16 NOTE — ED Notes (Signed)
Pt name called for updated vital, no response

## 2021-12-16 NOTE — ED Notes (Signed)
Clled pt.x3 for repeat vitalsigns

## 2022-02-21 ENCOUNTER — Inpatient Hospital Stay (HOSPITAL_COMMUNITY): Payer: Self-pay

## 2022-02-21 ENCOUNTER — Emergency Department (HOSPITAL_COMMUNITY): Payer: Self-pay

## 2022-02-21 ENCOUNTER — Inpatient Hospital Stay (HOSPITAL_COMMUNITY)
Admission: EM | Admit: 2022-02-21 | Discharge: 2022-02-23 | DRG: 433 | Discharge status: Against Medical Advice | Payer: Self-pay | Attending: Internal Medicine | Admitting: Internal Medicine

## 2022-02-21 ENCOUNTER — Other Ambulatory Visit: Payer: Self-pay

## 2022-02-21 ENCOUNTER — Encounter (HOSPITAL_COMMUNITY): Payer: Self-pay

## 2022-02-21 DIAGNOSIS — Z833 Family history of diabetes mellitus: Secondary | ICD-10-CM

## 2022-02-21 DIAGNOSIS — F1020 Alcohol dependence, uncomplicated: Secondary | ICD-10-CM | POA: Diagnosis present

## 2022-02-21 DIAGNOSIS — Z6823 Body mass index (BMI) 23.0-23.9, adult: Secondary | ICD-10-CM

## 2022-02-21 DIAGNOSIS — D649 Anemia, unspecified: Secondary | ICD-10-CM

## 2022-02-21 DIAGNOSIS — K7031 Alcoholic cirrhosis of liver with ascites: Secondary | ICD-10-CM | POA: Diagnosis present

## 2022-02-21 DIAGNOSIS — D6959 Other secondary thrombocytopenia: Secondary | ICD-10-CM | POA: Diagnosis present

## 2022-02-21 DIAGNOSIS — Z886 Allergy status to analgesic agent status: Secondary | ICD-10-CM

## 2022-02-21 DIAGNOSIS — Z8249 Family history of ischemic heart disease and other diseases of the circulatory system: Secondary | ICD-10-CM

## 2022-02-21 DIAGNOSIS — Z79899 Other long term (current) drug therapy: Secondary | ICD-10-CM

## 2022-02-21 DIAGNOSIS — Z720 Tobacco use: Secondary | ICD-10-CM

## 2022-02-21 DIAGNOSIS — Z597 Insufficient social insurance and welfare support: Secondary | ICD-10-CM

## 2022-02-21 DIAGNOSIS — F17210 Nicotine dependence, cigarettes, uncomplicated: Secondary | ICD-10-CM | POA: Diagnosis present

## 2022-02-21 DIAGNOSIS — R609 Edema, unspecified: Principal | ICD-10-CM

## 2022-02-21 DIAGNOSIS — F1010 Alcohol abuse, uncomplicated: Secondary | ICD-10-CM

## 2022-02-21 DIAGNOSIS — Z825 Family history of asthma and other chronic lower respiratory diseases: Secondary | ICD-10-CM

## 2022-02-21 DIAGNOSIS — D696 Thrombocytopenia, unspecified: Secondary | ICD-10-CM

## 2022-02-21 DIAGNOSIS — R601 Generalized edema: Secondary | ICD-10-CM | POA: Diagnosis present

## 2022-02-21 DIAGNOSIS — R634 Abnormal weight loss: Secondary | ICD-10-CM | POA: Diagnosis present

## 2022-02-21 DIAGNOSIS — K7040 Alcoholic hepatic failure without coma: Principal | ICD-10-CM | POA: Diagnosis present

## 2022-02-21 DIAGNOSIS — Z8673 Personal history of transient ischemic attack (TIA), and cerebral infarction without residual deficits: Secondary | ICD-10-CM

## 2022-02-21 DIAGNOSIS — F319 Bipolar disorder, unspecified: Secondary | ICD-10-CM | POA: Diagnosis present

## 2022-02-21 DIAGNOSIS — Z9081 Acquired absence of spleen: Secondary | ICD-10-CM

## 2022-02-21 DIAGNOSIS — E876 Hypokalemia: Secondary | ICD-10-CM

## 2022-02-21 DIAGNOSIS — K7460 Unspecified cirrhosis of liver: Secondary | ICD-10-CM

## 2022-02-21 DIAGNOSIS — Z5329 Procedure and treatment not carried out because of patient's decision for other reasons: Secondary | ICD-10-CM | POA: Diagnosis not present

## 2022-02-21 DIAGNOSIS — R7989 Other specified abnormal findings of blood chemistry: Secondary | ICD-10-CM

## 2022-02-21 DIAGNOSIS — E871 Hypo-osmolality and hyponatremia: Secondary | ICD-10-CM

## 2022-02-21 LAB — COMPREHENSIVE METABOLIC PANEL
ALT: 17 U/L (ref 0–44)
AST: 74 U/L — ABNORMAL HIGH (ref 15–41)
Albumin: 2.8 g/dL — ABNORMAL LOW (ref 3.5–5.0)
Alkaline Phosphatase: 156 U/L — ABNORMAL HIGH (ref 38–126)
Anion gap: 7 (ref 5–15)
BUN: 6 mg/dL (ref 6–20)
CO2: 24 mmol/L (ref 22–32)
Calcium: 8.1 mg/dL — ABNORMAL LOW (ref 8.9–10.3)
Chloride: 101 mmol/L (ref 98–111)
Creatinine, Ser: 0.51 mg/dL — ABNORMAL LOW (ref 0.61–1.24)
GFR, Estimated: >60 mL/min (ref >60)
Glucose, Bld: 98 mg/dL (ref 70–99)
Potassium: 3.1 mmol/L — ABNORMAL LOW (ref 3.5–5.1)
Sodium: 132 mmol/L — ABNORMAL LOW (ref 135–145)
Total Bilirubin: 4.5 mg/dL — ABNORMAL HIGH (ref 0.3–1.2)
Total Protein: 7.5 g/dL (ref 6.5–8.1)

## 2022-02-21 LAB — URINALYSIS, ROUTINE W REFLEX MICROSCOPIC
Bilirubin Urine: NEGATIVE
Glucose, UA: NEGATIVE mg/dL
Hgb urine dipstick: NEGATIVE
Ketones, ur: NEGATIVE mg/dL
Leukocytes,Ua: NEGATIVE
Nitrite: NEGATIVE
Protein, ur: NEGATIVE mg/dL
Specific Gravity, Urine: 1.01 (ref 1.005–1.030)
pH: 6 (ref 5.0–8.0)

## 2022-02-21 LAB — HEPATITIS PANEL, ACUTE
HCV Ab: NONREACTIVE
Hep A IgM: NONREACTIVE
Hep B C IgM: NONREACTIVE
Hepatitis B Surface Ag: NONREACTIVE

## 2022-02-21 LAB — CBC
HCT: 33.4 % — ABNORMAL LOW (ref 39.0–52.0)
Hemoglobin: 12.4 g/dL — ABNORMAL LOW (ref 13.0–17.0)
MCH: 36 pg — ABNORMAL HIGH (ref 26.0–34.0)
MCHC: 37.1 g/dL — ABNORMAL HIGH (ref 30.0–36.0)
MCV: 97.1 fL (ref 80.0–100.0)
Platelets: 107 10*3/uL — ABNORMAL LOW (ref 150–400)
RBC: 3.44 MIL/uL — ABNORMAL LOW (ref 4.22–5.81)
RDW: 17.4 % — ABNORMAL HIGH (ref 11.5–15.5)
WBC: 11.1 10*3/uL — ABNORMAL HIGH (ref 4.0–10.5)
nRBC: 0 % (ref 0.0–0.2)

## 2022-02-21 LAB — BODY FLUID CELL COUNT WITH DIFFERENTIAL
Lymphs, Fluid: 15 %
Monocyte-Macrophage-Serous Fluid: 78 % (ref 50–90)
Neutrophil Count, Fluid: 7 % (ref 0–25)
Total Nucleated Cell Count, Fluid: 92 cu mm (ref 0–1000)

## 2022-02-21 LAB — AMYLASE, PLEURAL OR PERITONEAL FLUID: Amylase, Fluid: 30 U/L

## 2022-02-21 LAB — LACTATE DEHYDROGENASE, PLEURAL OR PERITONEAL FLUID: LD, Fluid: 52 U/L — ABNORMAL HIGH (ref 3–23)

## 2022-02-21 LAB — ALBUMIN, PLEURAL OR PERITONEAL FLUID: Albumin, Fluid: <1.5 g/dL

## 2022-02-21 LAB — HIV ANTIBODY (ROUTINE TESTING W REFLEX): HIV Screen 4th Generation wRfx: NONREACTIVE

## 2022-02-21 LAB — PROTIME-INR
INR: 1.5 — ABNORMAL HIGH (ref 0.8–1.2)
Prothrombin Time: 17.7 seconds — ABNORMAL HIGH (ref 11.4–15.2)

## 2022-02-21 LAB — BRAIN NATRIURETIC PEPTIDE: B Natriuretic Peptide: 254.4 pg/mL — ABNORMAL HIGH (ref 0.0–100.0)

## 2022-02-21 LAB — LIPASE, BLOOD: Lipase: 54 U/L — ABNORMAL HIGH (ref 11–51)

## 2022-02-21 LAB — MAGNESIUM: Magnesium: 1.8 mg/dL (ref 1.7–2.4)

## 2022-02-21 MED ORDER — ACETAMINOPHEN 650 MG RE SUPP: 650.0000 mg | Freq: Four times a day (QID) | RECTAL | Status: DC | PRN

## 2022-02-21 MED ORDER — LIDOCAINE HCL 1 % IJ SOLN
INTRAMUSCULAR | Status: AC
  Administered 2022-02-21: 10 mL
  Filled 2022-02-21: qty 20

## 2022-02-21 MED ORDER — POTASSIUM CHLORIDE CRYS ER 20 MEQ PO TBCR
40.0000 meq | EXTENDED_RELEASE_TABLET | Freq: Two times a day (BID) | ORAL | Status: AC
  Administered 2022-02-21 – 2022-02-22 (×2): 40 meq via ORAL
  Filled 2022-02-21 (×2): qty 2

## 2022-02-21 MED ORDER — THIAMINE HCL 100 MG/ML IJ SOLN: 100.0000 mg | Freq: Every day | INTRAMUSCULAR | Status: DC

## 2022-02-21 MED ORDER — ACETAMINOPHEN 325 MG PO TABS: 650.0000 mg | ORAL_TABLET | Freq: Four times a day (QID) | ORAL | Status: DC | PRN

## 2022-02-21 MED ORDER — LORAZEPAM 1 MG PO TABS
1.0000 mg | ORAL_TABLET | ORAL | Status: DC | PRN
  Administered 2022-02-22: 1 mg via ORAL
  Filled 2022-02-21: qty 1

## 2022-02-21 MED ORDER — POTASSIUM CHLORIDE CRYS ER 20 MEQ PO TBCR
40.0000 meq | EXTENDED_RELEASE_TABLET | Freq: Once | ORAL | Status: AC
Start: 2022-02-21 — End: 2022-02-21
  Administered 2022-02-21: 40 meq via ORAL
  Filled 2022-02-21: qty 2

## 2022-02-21 MED ORDER — THIAMINE HCL 100 MG PO TABS
100.0000 mg | ORAL_TABLET | Freq: Every day | ORAL | Status: DC
  Administered 2022-02-21 – 2022-02-23 (×3): 100 mg via ORAL
  Filled 2022-02-21 (×3): qty 1

## 2022-02-21 MED ORDER — FUROSEMIDE 10 MG/ML IJ SOLN
80.0000 mg | Freq: Once | INTRAMUSCULAR | Status: AC
Start: 2022-02-21 — End: 2022-02-21
  Administered 2022-02-21: 80 mg via INTRAVENOUS
  Filled 2022-02-21: qty 8

## 2022-02-21 MED ORDER — ALBUMIN HUMAN 25 % IV SOLN
12.5000 g | INTRAVENOUS | Status: AC
  Administered 2022-02-21: 12.5 g via INTRAVENOUS
  Filled 2022-02-21: qty 50

## 2022-02-21 MED ORDER — FOLIC ACID 1 MG PO TABS
1.0000 mg | ORAL_TABLET | Freq: Every day | ORAL | Status: DC
  Administered 2022-02-21 – 2022-02-23 (×3): 1 mg via ORAL
  Filled 2022-02-21 (×3): qty 1

## 2022-02-21 MED ORDER — SPIRONOLACTONE 25 MG PO TABS: 100.0000 mg | ORAL_TABLET | Freq: Every day | ORAL | Status: DC

## 2022-02-21 MED ORDER — ADULT MULTIVITAMIN W/MINERALS CH
1.0000 | ORAL_TABLET | Freq: Every day | ORAL | Status: DC
  Administered 2022-02-21 – 2022-02-23 (×3): 1 via ORAL
  Filled 2022-02-21 (×3): qty 1

## 2022-02-21 MED ORDER — LORAZEPAM 2 MG/ML IJ SOLN
1.0000 mg | INTRAMUSCULAR | Status: DC | PRN
  Administered 2022-02-23: 2 mg via INTRAVENOUS
  Filled 2022-02-21: qty 1

## 2022-02-21 MED ORDER — PANTOPRAZOLE SODIUM 40 MG PO TBEC
40.0000 mg | DELAYED_RELEASE_TABLET | Freq: Every day | ORAL | Status: DC
Start: 2022-02-21 — End: 2022-02-23
  Administered 2022-02-21 – 2022-02-23 (×3): 40 mg via ORAL
  Filled 2022-02-21 (×3): qty 1

## 2022-02-21 MED ORDER — FUROSEMIDE 10 MG/ML IJ SOLN
40.0000 mg | Freq: Two times a day (BID) | INTRAMUSCULAR | Status: DC
  Administered 2022-02-21 – 2022-02-22 (×2): 40 mg via INTRAVENOUS
  Filled 2022-02-21 (×2): qty 4

## 2022-02-21 NOTE — Plan of Care (Signed)

## 2022-02-21 NOTE — ED Provider Notes (Signed)
?[facility] DEPT ?Provider Note ? ? ?CSN: [id number] ?Arrival date & time: 02/21/22  0807 ? ?  ? ?History ? ?Chief Complaint  ?Patient presents with  ? Abdominal Pain  ?  Leg swelling  ? Leg Swelling  ? ? ?Nathaniel Warren is a 56 y.o. male. ? ?Patient is a 56 year old male who presents with leg swelling and abdominal pain.  Per chart review, he has a history of bipolar disorder, seizures and prior EtOH abuse.  He states now he drinks 2 drinks a day which equates to about 1/5 of liquor per week.  He said he has significantly cut down on his drinking from previously.  He says he has had some progressive swelling of his lower extremities.  He was seen here in the ED last year for the symptoms.  He was given a few days worth of Lasix and was instructed to follow-up with cardiology.  He said he called different cardiologist and was not able to get in due to lack of insurance.  He says over the last several months the swelling has progressed where now he is swelling up into her thighs.  He has some pain across his lower abdomen.  He said he has had some distention and bloating of his abdomen over the last several months.  Its progressively getting worse.  He says he has increased pain whenever he eats anything but very bland foods.  He says that is when the reasons he had to cut back on alcohol because when he drank it hurt his abdomen.  He denies any nausea or vomiting.  No significant change in his stools.  He has had about a 30 pound weight loss over the last 6 months.  He does report some shortness of breath has been getting worse and also some intermittent chest pressure.  He describes it as a pressure feeling in the center of his chest.  It is nonradiating.  It usually last about 15 to 20 minutes.  The last episode he had was yesterday. ? ? ?  ? ?Home Medications ?Prior to Admission medications   ?Medication Sig Start Date End Date Taking? Authorizing Provider  ?divalproex (DEPAKOTE) 500  MG DR tablet Take 1 tablet (500 mg total) by mouth every 12 (twelve) hours. For mood control ?Patient not taking: Reported on 02/21/2022 11/07/17   Money, Lowry Ram, FNP  ?furosemide (LASIX) 20 MG tablet Take 1 tablet (20 mg total) by mouth daily for 5 days. ?Patient not taking: Reported on 02/21/2022 05/28/21 06/02/21  Lorayne Bender, PA-C  ?gabapentin (NEURONTIN) 300 MG capsule Take 1 capsule (300 mg total) by mouth 3 (three) times daily. For withdrawals ?Patient not taking: Reported on 02/21/2022 11/07/17   Money, Lowry Ram, FNP  ?QUEtiapine (SEROQUEL) 200 MG tablet Take 1 tablet (200 mg total) by mouth at bedtime. For mood control ?Patient not taking: Reported on 02/21/2022 11/07/17   Money, Lowry Ram, FNP  ?   ? ?Allergies    ?Asa [aspirin]   ? ?Review of Systems   ?Review of Systems  ?Constitutional:  Positive for appetite change, fatigue and unexpected weight change. Negative for chills, diaphoresis and fever.  ?HENT:  Negative for congestion, rhinorrhea and sneezing.   ?Eyes: Negative.   ?Respiratory:  Positive for chest tightness and shortness of breath. Negative for cough.   ?Cardiovascular:  Positive for chest pain and leg swelling.  ?Gastrointestinal:  Positive for abdominal distention and abdominal pain. Negative for blood in stool, diarrhea,  nausea and vomiting.  ?Genitourinary:  Negative for difficulty urinating, flank pain, frequency and hematuria.  ?Musculoskeletal:  Negative for arthralgias and back pain.  ?Skin:  Negative for rash.  ?Neurological:  Negative for dizziness, speech difficulty, weakness, numbness and headaches.  ? ?Physical Exam ?Updated Vital Signs ?BP 110/63   Pulse 67   Temp 98 ?F (36.7 ?C) (Oral)   Resp 19   Ht 5\' 9"  (1.753 m)   Wt 72.6 kg   SpO2 100%   BMI 23.63 kg/m?  ?Physical Exam ?Constitutional:   ?   Appearance: He is well-developed.  ?HENT:  ?   Head: Normocephalic and atraumatic.  ?Eyes:  ?   Pupils: Pupils are equal, round, and reactive to light.  ?Cardiovascular:  ?   Rate and  Rhythm: Normal rate and regular rhythm.  ?   Heart sounds: Normal heart sounds.  ?Pulmonary:  ?   Effort: Pulmonary effort is normal. No respiratory distress.  ?   Breath sounds: Normal breath sounds. No wheezing or rales.  ?Chest:  ?   Chest wall: No tenderness.  ?Abdominal:  ?   General: Bowel sounds are normal. There is distension.  ?   Palpations: Abdomen is soft. There is fluid wave.  ?   Tenderness: There is abdominal tenderness in the right lower quadrant, suprapubic area and left lower quadrant. There is no guarding or rebound.  ?   Comments: Has a midline incisional hernia that is soft and nontender and easily reduced  ?Musculoskeletal:     ?   General: Normal range of motion.  ?   Cervical back: Normal range of motion and neck supple.  ?Lymphadenopathy:  ?   Cervical: No cervical adenopathy.  ?Skin: ?   General: Skin is warm and dry.  ?   Findings: No rash.  ?Neurological:  ?   Mental Status: He is alert and oriented to person, place, and time.  ? ? ?ED Results / Procedures / Treatments   ?Labs ?(all labs ordered are listed, but only abnormal results are displayed) ?Labs Reviewed  ?LIPASE, BLOOD - Abnormal; Notable for the following components:  ?    Result Value  ? Lipase 54 (*)   ? All other components within normal limits  ?COMPREHENSIVE METABOLIC PANEL - Abnormal; Notable for the following components:  ? Sodium 132 (*)   ? Potassium 3.1 (*)   ? Creatinine, Ser 0.51 (*)   ? Calcium 8.1 (*)   ? Albumin 2.8 (*)   ? AST 74 (*)   ? Alkaline Phosphatase 156 (*)   ? Total Bilirubin 4.5 (*)   ? All other components within normal limits  ?CBC - Abnormal; Notable for the following components:  ? WBC 11.1 (*)   ? RBC 3.44 (*)   ? Hemoglobin 12.4 (*)   ? HCT 33.4 (*)   ? MCH 36.0 (*)   ? MCHC 37.1 (*)   ? RDW 17.4 (*)   ? Platelets 107 (*)   ? All other components within normal limits  ?BRAIN NATRIURETIC PEPTIDE - Abnormal; Notable for the following components:  ? B Natriuretic Peptide 254.4 (*)   ? All other  components within normal limits  ?MAGNESIUM  ?URINALYSIS, ROUTINE W REFLEX MICROSCOPIC  ? ? ?EKG ?EKG Interpretation ? ?Date/Time:  Friday February 21 2022 08:52:08 EDT ?Ventricular Rate:  69 ?PR Interval:  130 ?QRS Duration: 113 ?QT Interval:  451 ?QTC Calculation: 484 ?R Axis:   -31 ?Text Interpretation: Sinus rhythm Borderline  IVCD with LAD RSR' in V1 or V2, right VCD or RVH Borderline prolonged QT interval Confirmed by Malvin Johns [phone]) on 02/21/2022 9:27:44 AM ? ?Radiology ?CT Abdomen Pelvis Wo Contrast ? ?Result Date: 02/21/2022 ?CLINICAL DATA:  LLQ abdominal pain abdominal distension, lower abd pain EXAM: CT ABDOMEN AND PELVIS WITHOUT CONTRAST TECHNIQUE: Multidetector CT imaging of the abdomen and pelvis was performed following the standard protocol without IV contrast. RADIATION DOSE REDUCTION: This exam was performed according to the departmental dose-optimization program which includes automated exposure control, adjustment of the mA and/or kV according to patient size and/or use of iterative reconstruction technique. COMPARISON:  CT 06/07/2013 FINDINGS: Lower chest: No acute abnormality. Hepatobiliary: Markedly heterogeneous liver parenchyma with multifocal areas of low liver density. Tiny gallstones in the gallbladder fundus. The gallbladder is nondilated. Pancreas: Unremarkable. No pancreatic ductal dilatation or surrounding inflammatory changes. Spleen: Prior splenectomy with unchanged splenules. Adrenals/Urinary Tract: Adrenal glands are unremarkable. No hydronephrosis or nephrolithiasis. Bladder is unremarkable. Stomach/Bowel: The stomach is within normal limits. There is no evidence of bowel obstruction.The appendix is normal. Scattered sigmoid diverticula. No evidence of diverticulitis. Vascular/Lymphatic: Aortoiliac atherosclerotic calcifications. No AAA. Prominent left inguinal no lymph nodes, likely reactive. No visible enlarged abdominopelvic lymph nodes. Reproductive: Unremarkable. Other:  Large volume abdominopelvic ascites. There is a ventral hernia containing fat and ascites with a 2.4 cm aperture (series 2, image 45). This is above the umbilicus. There is no bowel containing hernia. Musculoskele

## 2022-02-21 NOTE — Procedures (Signed)
PROCEDURE SUMMARY: ? ?Successful US guided paracentesis from right abdomen.  ?Yielded 4.1 L of dark yellow/orange fluid.  ?No immediate complications.  ?Pt tolerated well.  ? ?Specimen sent for labs. ? ?EBL < 2 mL ? ?Mickie Kay, NP ?02/21/2022 ?4:16 PM ? ? ? ?

## 2022-02-21 NOTE — H&P (Signed)
?History and Physical  ? ? ?Patient: Nathaniel Warren TTS:[id number] DOB: September 14, 1966 ?DOA: 02/21/2022 ?DOS: the patient was seen and examined on 02/21/2022 ?PCP: Pcp, No  ?Patient coming from: Home ? ?Chief Complaint:  ?Chief Complaint  ?Patient presents with  ? Abdominal Pain  ?  Leg swelling  ? Leg Swelling  ? ?HPI: Nathaniel Warren is a 56 y.o. male with medical history significant of bipolar disorder, EtOH abuse, tobacco abuse. Presenting with leg swelling. His symptoms started 6 months ago. He has not followed with a PCP about his issue. He has not had fevers or any draining of fluid from his legs. The swelling has progressed to involve his hips and abdomen. He went to the ED last month with this issue. He was given lasix and a recommendation to follow up with cardiology per his account. He states his swelling is now worse. It is causing him stomach discomfort and leg pain. He became concerned and came back to the ED for evaluation.   ? ?Review of Systems: As mentioned in the history of present illness. All other systems reviewed and are negative. ?Past Medical History:  ?Diagnosis Date  ? Bipolar 1 disorder ([place])   ? Depression   ? Hernia, abdominal   ? Seizures ([place])   ? Stroke [facility])   ? ?Past Surgical History:  ?Procedure Laterality Date  ? ANKLE SURGERY    ? ANKLE SURGERY Right   ? SPLENECTOMY    ? ?Social History:  reports that he has been smoking cigarettes. He has a 30.00 pack-year smoking history. He has quit using smokeless tobacco. He reports current alcohol use. He reports current drug use. Drug: Marijuana. ? ?Allergies  ?Allergen Reactions  ? Asa [Aspirin] Other (See Comments)  ?  As a child, had nose bleed.   ? ? ?Family History  ?Problem Relation Age of Onset  ? COPD Mother   ? Diabetes Father   ? Hypertension Other   ? ? ?Prior to Admission medications   ?Medication Sig Start Date End Date Taking? Authorizing Provider  ?divalproex (DEPAKOTE) 500 MG DR tablet Take 1 tablet (500 mg total) by mouth every  12 (twelve) hours. For mood control ?Patient not taking: Reported on 02/21/2022 11/07/17   Money, Lowry Ram, FNP  ?furosemide (LASIX) 20 MG tablet Take 1 tablet (20 mg total) by mouth daily for 5 days. ?Patient not taking: Reported on 02/21/2022 05/28/21 06/02/21  Lorayne Bender, PA-C  ?gabapentin (NEURONTIN) 300 MG capsule Take 1 capsule (300 mg total) by mouth 3 (three) times daily. For withdrawals ?Patient not taking: Reported on 02/21/2022 11/07/17   Money, Lowry Ram, FNP  ?QUEtiapine (SEROQUEL) 200 MG tablet Take 1 tablet (200 mg total) by mouth at bedtime. For mood control ?Patient not taking: Reported on 02/21/2022 11/07/17   Money, Lowry Ram, FNP  ? ? ?Physical Exam: ?Vitals:  ? 02/21/22 1000 02/21/22 1030 02/21/22 1045 02/21/22 1100  ?BP: 101/70 110/63  103/70  ?Pulse: 69 85 67 70  ?Resp: 18 (!) _0 ?Temp:      ?TempSrc:      ?SpO2: 95% 97% 100% 98%  ?Weight:      ?Height:      ? ?General: 56 y.o. male resting in bed in NAD ?Eyes: PERRL, normal sclera ?ENMT: Nares patent w/o discharge, orophaynx clear, dentition normal, ears w/o discharge/lesions/ulcers ?Neck: Supple, trachea midline ?Cardiovascular: RRR, +S1, S2, no m/g/r, equal pulses throughout ?Respiratory: CTABL, no w/r/r, normal WOB ?GI: BS+, distended,  soft, NT, umbilical hernia noted, no masses noted ?MSK: No c/c; BLE edema 4+ ?Neuro: A&O x 3, no focal deficits ?Psyc: Appropriate interaction and affect, calm/cooperative ? ?Data Reviewed: ? ?Na+  132 ?K+  3.1 ?SCr  0.51 ?Alk phos  156 ?Albumin  2.8 ?Lipase  54 ?AST  74 ?T bili  4.5 ?BNP  254.4 ?WBC  11.1 ?Hgb  12.4 ?Plt  107 ? ?CT ab/pelvis: Large volume abdominopelvic ascites in the setting of a markedly ?heterogeneous liver parenchyma with multifocal areas of low liver density suggesting cirrhosis and steatosis. When able, contrast-enhanced, liver protocol CT or MRI should be performed to evaluate for any suspicious lesions in the liver. ? ?Assessment and Plan: ?No notes have been filed under this hospital  service. ?Service: Hospitalist ?Anasarca ?Abdominal pain ?Elevated LFTs ?Hyperbilirubinemia ?    - admit to inpt, tele ?    - extensive swelling of both legs, CT w/ extensive ascites ?    - paracentesis ordered, labs ordered, albumin ordered ?    - continue lasix; will add spironolactone as BP permits ?    - [place] RUQ ordered ?    - check hep panel, INR ?    - WBC mildly elevated, but no fever or abdominal tenderness on exam; will hold on rocephin for right now ?    - CT liver protocol prior to discharge; let's get him diuresed first ? ?Elevated BNP ?    - checking echo ?    - no chest pain, EKG sinus w/o st elevations ?    - no previous history of HF ?    - continue lasix ?    - watch I&O, daily wts ?    - fluid restriction to 1200cc ? ?Hypokalemia ?Hyponatremia ?    - replace K+; Mg2+ is ok ?    - Na+ is mildly low, follow for now ? ?Normocytic anemia ?    - no evidence of bleed; follow ? ?Thrombocytopenia ?    - likely owing to liver disease; follow for now ?    - SCDs for DVT Ppx ? ?Tobacco abuse ?    - counseled against further use ? ?EtOH abuse w/ history of withdrawal seizures ?    - counseled against further use ?    - CIWA protocol ? ? Advance Care Planning:   Code Status: FULL ? ?Consults: None ? ?Family Communication: None at bedside ? ?Severity of Illness: ?The appropriate patient status for this patient is INPATIENT. Inpatient status is judged to be reasonable and necessary in order to provide the required intensity of service to ensure the patient's safety. The patient's presenting symptoms, physical exam findings, and initial radiographic and laboratory data in the context of their chronic comorbidities is felt to place them at high risk for further clinical deterioration. Furthermore, it is not anticipated that the patient will be medically stable for discharge from the hospital within 2 midnights of admission.  ? ?* I certify that at the point of admission it is my clinical judgment that the patient  will require inpatient hospital care spanning beyond 2 midnights from the point of admission due to high intensity of service, high risk for further deterioration and high frequency of surveillance required.* ? ?Author: ?Jonnie Finner, DO ?02/21/2022 11:38 AM ? ?For on call review [email or web address].  ?

## 2022-02-21 NOTE — ED Triage Notes (Signed)
Patient reports that he has had lower extremity swelling and abdominal pain and swelling since December. Patient states that he does not have insurance and has not been to suggested referrals. Patient states swelling much worse now and has trouble walking in the afternoons. ?Patient also c/o SOB. ?

## 2022-02-22 ENCOUNTER — Inpatient Hospital Stay (HOSPITAL_COMMUNITY): Payer: Self-pay

## 2022-02-22 DIAGNOSIS — R7989 Other specified abnormal findings of blood chemistry: Secondary | ICD-10-CM

## 2022-02-22 DIAGNOSIS — E871 Hypo-osmolality and hyponatremia: Secondary | ICD-10-CM

## 2022-02-22 DIAGNOSIS — D649 Anemia, unspecified: Secondary | ICD-10-CM

## 2022-02-22 DIAGNOSIS — D696 Thrombocytopenia, unspecified: Secondary | ICD-10-CM

## 2022-02-22 DIAGNOSIS — Z720 Tobacco use: Secondary | ICD-10-CM

## 2022-02-22 DIAGNOSIS — I509 Heart failure, unspecified: Secondary | ICD-10-CM

## 2022-02-22 DIAGNOSIS — E876 Hypokalemia: Secondary | ICD-10-CM

## 2022-02-22 DIAGNOSIS — R609 Edema, unspecified: Secondary | ICD-10-CM

## 2022-02-22 DIAGNOSIS — K7031 Alcoholic cirrhosis of liver with ascites: Secondary | ICD-10-CM

## 2022-02-22 DIAGNOSIS — F1010 Alcohol abuse, uncomplicated: Secondary | ICD-10-CM

## 2022-02-22 LAB — ECHOCARDIOGRAM COMPLETE
AR max vel: 2.62 cm2
AV Area VTI: 2.4 cm2
AV Area mean vel: 2.33 cm2
AV Mean grad: 8 mmHg
AV Peak grad: 14.7 mmHg
Ao pk vel: 1.92 m/s
Area-P 1/2: 1.54 cm2
Calc EF: 77.2 %
Height: 69 in
S' Lateral: 3.2 cm
Single Plane A2C EF: 78.3 %
Single Plane A4C EF: 77.4 %
Weight: 2606.72 oz

## 2022-02-22 LAB — COMPREHENSIVE METABOLIC PANEL
ALT: 15 U/L (ref 0–44)
AST: 61 U/L — ABNORMAL HIGH (ref 15–41)
Albumin: 2.6 g/dL — ABNORMAL LOW (ref 3.5–5.0)
Alkaline Phosphatase: 145 U/L — ABNORMAL HIGH (ref 38–126)
Anion gap: 8 (ref 5–15)
BUN: 8 mg/dL (ref 6–20)
CO2: 30 mmol/L (ref 22–32)
Calcium: 8.6 mg/dL — ABNORMAL LOW (ref 8.9–10.3)
Chloride: 98 mmol/L (ref 98–111)
Creatinine, Ser: 0.71 mg/dL (ref 0.61–1.24)
GFR, Estimated: >60 mL/min (ref >60)
Glucose, Bld: 100 mg/dL — ABNORMAL HIGH (ref 70–99)
Potassium: 4 mmol/L (ref 3.5–5.1)
Sodium: 136 mmol/L (ref 135–145)
Total Bilirubin: 5.3 mg/dL — ABNORMAL HIGH (ref 0.3–1.2)
Total Protein: 6.9 g/dL (ref 6.5–8.1)

## 2022-02-22 LAB — CBC
HCT: 34 % — ABNORMAL LOW (ref 39.0–52.0)
Hemoglobin: 12.2 g/dL — ABNORMAL LOW (ref 13.0–17.0)
MCH: 36 pg — ABNORMAL HIGH (ref 26.0–34.0)
MCHC: 35.9 g/dL (ref 30.0–36.0)
MCV: 100.3 fL — ABNORMAL HIGH (ref 80.0–100.0)
Platelets: 87 10*3/uL — ABNORMAL LOW (ref 150–400)
RBC: 3.39 MIL/uL — ABNORMAL LOW (ref 4.22–5.81)
RDW: 17.5 % — ABNORMAL HIGH (ref 11.5–15.5)
WBC: 9.2 10*3/uL (ref 4.0–10.5)
nRBC: 0 % (ref 0.0–0.2)

## 2022-02-22 LAB — RAPID URINE DRUG SCREEN, HOSP PERFORMED
Amphetamines: NOT DETECTED
Barbiturates: NOT DETECTED
Benzodiazepines: NOT DETECTED
Cocaine: NOT DETECTED
Opiates: NOT DETECTED
Tetrahydrocannabinol: POSITIVE — AB

## 2022-02-22 LAB — ETHANOL: Alcohol, Ethyl (B): <10 mg/dL (ref <10)

## 2022-02-22 MED ORDER — FUROSEMIDE 20 MG PO TABS
20.0000 mg | ORAL_TABLET | Freq: Every day | ORAL | Status: DC
Start: 2022-02-23 — End: 2022-02-23
  Administered 2022-02-23: 20 mg via ORAL
  Filled 2022-02-22: qty 1

## 2022-02-22 MED ORDER — SPIRONOLACTONE 25 MG PO TABS
50.0000 mg | ORAL_TABLET | Freq: Every day | ORAL | Status: DC
  Administered 2022-02-22 – 2022-02-23 (×2): 50 mg via ORAL
  Filled 2022-02-22 (×2): qty 2

## 2022-02-22 NOTE — Progress Notes (Signed)
? ?  Echocardiogram ?2D Echocardiogram has been performed. ? ?Nathaniel Warren ?02/22/2022, 9:25 AM ?

## 2022-02-22 NOTE — Progress Notes (Signed)
?PROGRESS NOTE ? ? ? ?Nathaniel Warren  [id number] DOB: 03/09/1966 DOA: 02/21/2022 ?PCP: Pcp, No  ? ? ? ?Brief Narrative:  ?Nathaniel Warren is a 56 y.o. WM PMHx Seizures, Stroke, bipolar 1 disorder, EtOH abuse, tobacco abuse.  ? ?Presenting with leg swelling. His symptoms started 6 months ago. He has not followed with a PCP about his issue. He has not had fevers or any draining of fluid from his legs. The swelling has progressed to involve his hips and abdomen. He went to the ED last month with this issue. He was given lasix and a recommendation to follow up with cardiology per his account. He states his swelling is now worse. It is causing him stomach discomfort and leg pain. He became concerned and came back to the ED for evaluation.   ?  ? ? ?Subjective: ?A/O x4.  States feels much more comfortable s/p paracentesis. ? ? ?Assessment & Plan: ?Covid vaccination; ?  ?Principal Problem: ?  Anasarca ?Active Problems: ?  Alcohol dependence ([place]) ?  Hypokalemia ?  Hyponatremia ?  Elevated LFTs ?  Hyperbilirubinemia ?  Elevated brain natriuretic peptide (BNP) level ?  Tobacco abuse ?  Alcohol abuse ?  Thrombocytopenia ([place]) ?  Normocytic anemia ? ? ?Liver failure/anasarca ?-3/17 CT abdomen and pelvis consistent with cirrhosis see results below.  MRI abdomen liver protocol recommended. ?-3/18 s/p paracentesis aspirated 4.1 L: Specimen sent to lab ?-3/18 consult TOC patient without insurance therefore no PCP.  Patient requires PCP, please help with obtaining PCP in town, follow-up appointment with [facility] outpatient clinic, or family medicine residency program. ?-3/18 Nutrition:Patiently with newly diagnosed liver failure will need education on appropriate liver failure diet. ?-3/18 MRI Abdomen liver protocol show masses suspicious for [facility] and high risk patient.  Pending ?-3/18 per guidelines Spironolactone 50 mg daily disease fairly small gentleman), may titrate up q 3-5 days ?- 3/18 per guidelines Lasix 20 mg PO daily  (fairly small gentleman), may titrate up q 3-5 days ?-3/18 US abdomen RUQ shows additional moderate to large ascites see results below.  We would perform another paracentesis on Monday prior to patient's discharge ? ?Elevated LFT ?- See liver failure ? ?Abdominal pain ?- Improved post paracentesis. ?  ?Elevated BNP ?-Mild elevation= 254.4 ?-Echocardiogram only shows hyperdynamic heart see echo below. ?  ?Normocytic anemia? ?- Anemia panel pending ? ?Thrombocytopenia ?- Most likely secondary to liver disease ?      ?Tobacco abuse ?    - counseled against further use ?  ?EtOH abuse w/ history of withdrawal seizures ?-Last drink Wednesday.  Patient states if he goes longer than 7 days without drink he will have seizures. ?-3/18 EtOH level <10 ?- 3/18 urine rapid drug screen pending ?    - counseled against further use ?    - CIWA protocol ? ?Goals of care ?- 3/18 consult to [facility], patient will require PCP and GI physician.  Unfortunately has no insurance therefore will be difficult to find.  RN Eustaquio Maize will attempt to find accepting physicians on Monday given patient's serious new diagnosis of liver failure ? ? ?  ? ? ?Mobility Assessment (last 72 hours)   ? ? Mobility Assessment   ? ? [place] Name 02/21/22 2100 02/21/22 1409  ?  ?  ?  ? Does patient have an order for bedrest or is patient medically unstable No - Continue assessment No - Continue assessment     ? What is the highest level of mobility based  on the progressive mobility assessment? Level 6 (Walks independently in room and hall) - Balance while walking in room without assist - Complete Level 6 (Walks independently in room and hall) - Balance while walking in room without assist - Complete     ? ?  ?  ? ?  ? ? ?DVT prophylaxis: SCD (thrombocytopenia) ?Code Status: Full ?Family Communication:  ?Status is: Inpatient ? ? ? ?Dispo: The patient is from: Home ?             Anticipated d/c is to: Home ?             Anticipated d/c date is: > 3 days ?             Patient  currently is not medically stable to d/c. ? ? ? ? ? ?Consultants:  ?IR ? ? ?Procedures/Significant Events:  ?3/17 CT abdomen pelvis wo contrast:Large volume abdominopelvic ascites in the setting of a markedly ?heterogeneous liver parenchyma with multifocal areas of low liver ?density suggesting cirrhosis and steatosis. When able, ?contrast-enhanced, liver protocol CT or MRI should be performed to ?evaluate for any suspicious lesions in the liver. ?3/17 US Abdomen RUQ ?- Nodularity in the liver surface suggesting cirrhosis.  ?-inhomogeneous echogenicity in the liver. Possibility of space-occupying lesion is not excluded. Follow-up multiphasic CT or MRI  ?-few small hyperechoic foci in the gallbladder suggesting possible tiny gallbladder stones. Sludge is also present in the gallbladder lumen.  ?-Moderate to large ascites. ?3/18 s/p paracentesis aspirated 4.1 L ?3/18 Echocardiogram: LVEF= 65 to 70%. The left ventricle has hyperdynamic function.  ?-Left ventricular diastolic parameters were normal.  ?-Right ventricular systolic function is hyperdynamic.  ?3/19 MRI liver W/WO contrast ?-Nodular liver contour compatible with the reported clinical history of cirrhosis.  ?-No focal worrisome hepatic mass to suggest hepatocellular carcinoma. ?-Moderate to large volume ascites ? ? ? ?I have personally reviewed and interpreted all radiology studies and my findings are as above. ? ?VENTILATOR SETTINGS: ? ? ? ?Cultures ?3/17 acute hepatitis panel negative ?3/17 HIV screen negative ?3/17 pleural fluid pending ? ? ? ? ? ?Continuous Infusions: ? ? ?Objective: ?Vitals:  ? 02/21/22 1958 02/22/22 0108 02/22/22 0415 02/22/22 0416  ?BP: 104/66 113/60 (!) 93/59   ?Pulse: 89 65 62   ?Resp: 20 18 18    ?Temp: 98.5 ?F (36.9 ?C) 98.5 ?F (36.9 ?C) 98.3 ?F (36.8 ?C)   ?TempSrc: Oral Oral Oral   ?SpO2: 98% 100% 97%   ?Weight:    73.9 kg  ?Height:      ? ? ?Intake/Output Summary (Last 24 hours) at 02/22/2022 0923 ?Last data filed at 02/22/2022  0415 ?Gross per 24 hour  ?Intake 120 ml  ?Output 4075 ml  ?Net -3955 ml  ? ?Filed Weights  ? 02/21/22 0821 02/22/22 0416  ?Weight: 72.6 kg 73.9 kg  ? ? ?Examination: ? ?General: A/O x4 No acute respiratory distress ?Eyes: negative scleral hemorrhage, negative anisocoria, negative icterus ?ENT: Negative Runny nose, negative gingival bleeding, ?Neck:  Negative scars, masses, torticollis, lymphadenopathy, JVD ?Lungs: Clear to auscultation bilaterally without wheezes or crackles ?Cardiovascular: Regular rate and rhythm without murmur gallop or rub normal S1 and S2 ?Abdomen: negative abdominal pain, nondistended, positive soft, bowel sounds, no rebound, no ascites, no appreciable mass ?Extremities: No significant cyanosis, clubbing, or edema bilateral lower extremities ?Skin: Negative rashes, lesions, ulcers, mild jaundice ?Psychiatric:  Negative depression, negative anxiety, negative fatigue, negative mania  ?Central nervous system:  Cranial nerves II through XII intact, tongue/uvula  midline, all extremities muscle strength 5/5, sensation intact throughout, finger nose finger bilateral within normal limits, quick finger touch bilateral within normal limits, negative dysarthria, negative expressive aphasia, negative receptive aphasia. ? ?.  ? ? ? ?Data Reviewed: Care during the described time interval was provided by me .  I have reviewed this patient's available data, including medical history, events of note, physical examination, and all test results as part of my evaluation. ? ?CBC: ?Recent Labs  ?Lab 02/21/22 ?0835 02/22/22 ?0320  ?WBC 11.1* 9.2  ?HGB 12.4* 12.2*  ?HCT 33.4* 34.0*  ?MCV 97.1 100.3*  ?PLT 107* 87*  ? ?Basic Metabolic Panel: ?Recent Labs  ?Lab 02/21/22 ?0835 02/22/22 ?0320  ?NA 132* 136  ?K 3.1* 4.0  ?CL 101 98  ?CO2 24 30  ?GLUCOSE 98 100*  ?BUN 6 8  ?CREATININE 0.51* 0.71  ?CALCIUM 8.1* 8.6*  ?MG 1.8  --   ? ?GFR: ?Estimated Creatinine Clearance: 105.6 mL/min (by C-G formula based on SCr of 0.71  mg/dL). ?Liver Function Tests: ?Recent Labs  ?Lab 02/21/22 ?0835 02/22/22 ?0320  ?AST 74* 61*  ?ALT 17 15  ?ALKPHOS 156* 145*  ?BILITOT 4.5* 5.3*  ?PROT 7.5 6.9  ?ALBUMIN 2.8* 2.6*  ? ?Recent Labs  ?Lab 02/21/22 ?0

## 2022-02-23 ENCOUNTER — Inpatient Hospital Stay (HOSPITAL_COMMUNITY): Payer: Self-pay

## 2022-02-23 LAB — RETICULOCYTES
Immature Retic Fract: 9.4 % (ref 2.3–15.9)
RBC.: 3.29 MIL/uL — ABNORMAL LOW (ref 4.22–5.81)
Retic Count, Absolute: 65.1 10*3/uL (ref 19.0–186.0)
Retic Ct Pct: 2 % (ref 0.4–3.1)

## 2022-02-23 LAB — IRON AND TIBC
Iron: 98 ug/dL (ref 45–182)
Saturation Ratios: 94 % — ABNORMAL HIGH (ref 17.9–39.5)
TIBC: 104 ug/dL — ABNORMAL LOW (ref 250–450)
UIBC: 6 ug/dL

## 2022-02-23 LAB — CBC WITH DIFFERENTIAL/PLATELET
Abs Immature Granulocytes: 0.09 10*3/uL — ABNORMAL HIGH (ref 0.00–0.07)
Basophils Absolute: 0.1 10*3/uL (ref 0.0–0.1)
Basophils Relative: 1 %
Eosinophils Absolute: 0.2 10*3/uL (ref 0.0–0.5)
Eosinophils Relative: 2 %
HCT: 32.2 % — ABNORMAL LOW (ref 39.0–52.0)
Hemoglobin: 11.8 g/dL — ABNORMAL LOW (ref 13.0–17.0)
Immature Granulocytes: 1 %
Lymphocytes Relative: 28 %
Lymphs Abs: 2.8 10*3/uL (ref 0.7–4.0)
MCH: 35.6 pg — ABNORMAL HIGH (ref 26.0–34.0)
MCHC: 36.6 g/dL — ABNORMAL HIGH (ref 30.0–36.0)
MCV: 97.3 fL (ref 80.0–100.0)
Monocytes Absolute: 1.1 10*3/uL — ABNORMAL HIGH (ref 0.1–1.0)
Monocytes Relative: 11 %
Neutro Abs: 5.6 10*3/uL (ref 1.7–7.7)
Neutrophils Relative %: 57 %
Platelets: 99 10*3/uL — ABNORMAL LOW (ref 150–400)
RBC: 3.31 MIL/uL — ABNORMAL LOW (ref 4.22–5.81)
RDW: 17.5 % — ABNORMAL HIGH (ref 11.5–15.5)
Smear Review: DECREASED
WBC: 9.9 10*3/uL (ref 4.0–10.5)
nRBC: 0 % (ref 0.0–0.2)

## 2022-02-23 LAB — COMPREHENSIVE METABOLIC PANEL
ALT: 14 U/L (ref 0–44)
AST: 60 U/L — ABNORMAL HIGH (ref 15–41)
Albumin: 2.4 g/dL — ABNORMAL LOW (ref 3.5–5.0)
Alkaline Phosphatase: 134 U/L — ABNORMAL HIGH (ref 38–126)
Anion gap: 8 (ref 5–15)
BUN: 9 mg/dL (ref 6–20)
CO2: 27 mmol/L (ref 22–32)
Calcium: 8.6 mg/dL — ABNORMAL LOW (ref 8.9–10.3)
Chloride: 103 mmol/L (ref 98–111)
Creatinine, Ser: 0.69 mg/dL (ref 0.61–1.24)
GFR, Estimated: >60 mL/min (ref >60)
Glucose, Bld: 104 mg/dL — ABNORMAL HIGH (ref 70–99)
Potassium: 3.6 mmol/L (ref 3.5–5.1)
Sodium: 138 mmol/L (ref 135–145)
Total Bilirubin: 4.8 mg/dL — ABNORMAL HIGH (ref 0.3–1.2)
Total Protein: 6.8 g/dL (ref 6.5–8.1)

## 2022-02-23 LAB — MAGNESIUM: Magnesium: 1.8 mg/dL (ref 1.7–2.4)

## 2022-02-23 LAB — VITAMIN B12: Vitamin B-12: 1196 pg/mL — ABNORMAL HIGH (ref 180–914)

## 2022-02-23 LAB — LIPASE, BLOOD: Lipase: 56 U/L — ABNORMAL HIGH (ref 11–51)

## 2022-02-23 LAB — PROTIME-INR
INR: 1.6 — ABNORMAL HIGH (ref 0.8–1.2)
Prothrombin Time: 18.6 seconds — ABNORMAL HIGH (ref 11.4–15.2)

## 2022-02-23 LAB — PHOSPHORUS: Phosphorus: 3.2 mg/dL (ref 2.5–4.6)

## 2022-02-23 LAB — AMMONIA: Ammonia: 76 umol/L — ABNORMAL HIGH (ref 9–35)

## 2022-02-23 LAB — FOLATE: Folate: 9.6 ng/mL (ref >5.9)

## 2022-02-23 LAB — APTT: aPTT: 40 seconds — ABNORMAL HIGH (ref 24–36)

## 2022-02-23 LAB — FERRITIN: Ferritin: 797 ng/mL — ABNORMAL HIGH (ref 24–336)

## 2022-02-23 MED ORDER — GADOBUTROL 1 MMOL/ML IV SOLN
7.0000 mL | Freq: Once | INTRAVENOUS | Status: AC | PRN
  Administered 2022-02-23: 7 mL via INTRAVENOUS

## 2022-02-23 NOTE — Discharge Summary (Signed)
? ? ? ?                                               Against Medical Advice ?Patient at this time expresses desire to leave the Hospital immidiately, patient has been warned that this is not Medically advisable at this time, and can result in Medical complications like Death and Disability, patient understands and accepts the risks involved and assumes full responsibilty of this decision. ? ?This patient has also been advised that if they feel the need for further medical assistance to return to any available ER or dial 9-1-1. ? ?Informed by Nursing staff that this patient has left care and has signed the form  Against Medical Advice on 02/23/2022 at 2:52 PM ? ?Dr Lyda Jester, Joseph Art ?[facility] ?[place] ? ?  ?

## 2022-02-23 NOTE — TOC Initial Note (Addendum)
Transition of Care (TOC) - Initial/Assessment Note  ? ? ?Patient Details  ?Name: Nathaniel Warren ?MRN: [id number] ?Date of Birth: 01/02/1966 ? ?Transition of Care (TOC) CM/SW Contact:    ?Cecille Po, RN ?Phone Number: ?02/23/2022, 11:24 AM ? ?Clinical Narrative:                 ? ?Spoke with patient.  He agrees to accept substance abuse resources, would like them to be on his discharge paperwork.  ? ?CM explained about the community clinics available, and that we can set up one to be his PCP and assist with medications.  CM cannot make that appt until Monday, though, since clinics are closed today.  Patient states he's discharging today and to just put the information on his discharge paperwork and he will make the appt.  No d/c order in chart at the moment, but patient says he's up and dressed and ready to go.   ? ?TOC CM put both substance abuse resources and contact information for the [facility] in case patient is d/c today.   ? ?Addendum: Spoke with attending, who states patient is not medically stable for discharge today.  Confirms patient needs a PCP appt and a GI appt if anyone willing to accept him without insurance.  Aware that TOC unable to scheduled appts until tomorrow due to clinics being closed on the weekend.  ? ?Expected Discharge Plan: Home/Self Care ?  ? ?Expected Discharge Plan and Services ?Expected Discharge Plan: Home/Self Care ?  ? ?Patient language and need for interpreter reviewed:: Yes ?       ?Need for Family Participation in Patient Care: Yes (Comment) ?Care giver support system in place?: Yes (comment) ?  ?Criminal Activity/Legal Involvement Pertinent to Current Situation/Hospitalization: No - Comment as needed ? ?Activities of Daily Living ?Home Assistive Devices/Equipment: None ?ADL Screening (condition at time of admission) ?Patient's cognitive ability adequate to safely complete daily activities?: Yes ?Is the patient deaf or have difficulty hearing?:  No ?Does the patient have difficulty seeing, even when wearing glasses/contacts?: No ?Does the patient have difficulty concentrating, remembering, or making decisions?: No ?Patient able to express need for assistance with ADLs?: Yes ?Does the patient have difficulty dressing or bathing?: No ?Independently performs ADLs?: Yes (appropriate for developmental age) ?Does the patient have difficulty walking or climbing stairs?: No ?Weakness of Legs: None ?Weakness of Arms/Hands: None ? ?  ? ?Emotional Assessment ?  ?Attitude/Demeanor/Rapport: Engaged ?  ?Orientation: : Oriented to Self, Oriented to Place, Oriented to  Time, Oriented to Situation ?Alcohol / Substance Use: Alcohol Use ?Psych Involvement: No (comment) ? ?Admission diagnosis:  Hyperbilirubinemia [E80.6] ?Anasarca [R60.1] ?Peripheral edema [R60.9] ?Cirrhosis of liver with ascites, unspecified hepatic cirrhosis type (HCC) [K74.60, R18.8] ?Ascites due to alcoholic cirrhosis (HCC) [K70.31] ?Patient Active Problem List  ? Diagnosis Date Noted  ? Anasarca 02/21/2022  ? Hypokalemia 02/21/2022  ? Hyponatremia 02/21/2022  ? Elevated LFTs 02/21/2022  ? Hyperbilirubinemia 02/21/2022  ? Elevated brain natriuretic peptide (BNP) level 02/21/2022  ? Tobacco abuse 02/21/2022  ? Alcohol abuse 02/21/2022  ? Thrombocytopenia (HCC) 02/21/2022  ? Normocytic anemia 02/21/2022  ? Multiple lacunar infarcts (HCC) 10/21/2017  ? Drug overdose, intentional, initial encounter (HCC) 10/20/2017  ? Drug overdose, intentional self-harm, initial encounter (HCC) 10/20/2017  ? Seizures (HCC) 10/20/2017  ? Drug overdose 10/20/2017  ? Alcohol dependence (HCC) 06/19/2013  ?  Class: Acute  ? 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  Class: Acute  ? GAD (generalized anxiety disorder) 04/10/2013  ? Depression 01/11/2013  ? Anxiety 01/11/2013  ? ?PCP:  Pcp, No ?Pharmacy:   ?[id number] - [place]), [place] - [address] ?[address] ?[place] ([place] [address] ?Phone: [phone] Fax: [phone] ? ? ? ?Readmission Risk Interventions ?No flowsheet data found. ? ? ?

## 2022-02-23 NOTE — Discharge Instructions (Signed)
Outpatient Substance Use Treatment Services   [facility] [address] [place], [place] [address]  [phone] Private insurance, Medicare A&B, and GCCN   [facility]  [address]  [place], [place] [address] [phone] Medicaid, Self Pay   [facility]      [address]  [place], [place] [phone] Medicaid and Private Insurance, Self Pay   [facility] [address]  [place], [place]  [phone] Private Insurance, and Self Pay  [facility]      [address]    [place], [place] [address]  [phone] or [phone] Private Insurance Only   [facility] [address]  [place], [place] [address] [phone] Medicaid, Medicare, Private Insurance  [facility] [address]  [place], [place] [address] [phone] Services are free or reduced  [facility]  [address] [phone]  Self Pay only, sliding scale  [facility]  [address]  [place], [place] [address] [phone] ([facility]) Self Pay, Medicaid Only   [facility] [address]  [place], [place] [address] [phone] Medicaid, Medicare, Private Insurance  Residential Substance Use Treatment Services   [facility]  [address]  [place], [place] [address]  [phone] or [phone] Detox (Medicare, Medicaid, private insurance, and self pay)  Residential Rehab 14 days (Medicare, Medicaid, private insurance, and self pay)   [facility]  [address] [place], [place]  [phone]  Male and Male Detox (Self Pay and Medicaid limited availability)  Rehab only Male (Medicaid and self pay only)   [facility]      [address]   , [place] [address]  [phone] or [phone] Detox and Residential Treatment Private Insurance Only   [facility]  [address]  [place], [place] [address]  [phone]  Treatment Only, must make assessment appointment, and must be sober for assessment appointment.  Self Pay Only, Medicare A&B, [place] Medicaid, [place] ID only! *Transportation assistance offered from [employer] [address]  [facility]     [address] [place], [place] [address] Walk in interviews M-Sat 8-4p No pending legal charges [phone]  [facility]:  [facility]  [address] [place], [place] [phone] (Self Pay, Medicaid)  [facility] [address] [place], [place] [address] [phone] Detox and Residential Treatment Medicare and Private Insurance  [facility] [address]  [place], [place] [address] 28 Day Women's Facility: [phone] 28 Day Men's Facility: [phone] Long-term Residential Program:  [phone] Males 25 and Over (No Insurance, upfront fee)  [facility]  [address] [place], [place] [address] [phone] Private Insurance with [employer], Private Pay  [facility] [address] [place], [place] [address] Local [phone] Private Insurance Only  [facility] [address]  , [place] [address]  [phone] (Males, upfront fee)  [facility] [place] [address]  [place], [address] [phone] Private Insurance   [facility] Locations  [facility]  [address]  [place], [place]  [phone] Christian Based Program for individuals experiencing homelessness Self Pay, No insurance  [facility]: [facility] [address]  [place],  [address] [phone]  [facility]: [place] [facility] [address]   [place], [place] [address] [phone] Christian Based Program for individuals  experiencing homelessness Self Pay, No insurance  [facility] [address]  [place], [place] [address]  [phone] Christian Based Program for individuals experiencing homelessness Self Pay, No insurance  [facility] [address]  , [place] [address] [phone] Christian Based Program for individuals experiencing homelessness Self Pay, No insurance  [facility] [address] [place],  [phone] Christian Based Program for males experiencing homelessness Self Pay, No insurance                    

## 2022-02-23 NOTE — Progress Notes (Signed)
Patient signed AMA form. RN witnessed signature. MD came to bedside to talk with patient and patient states that he understands all information explained. IV has been removed. Site is clean, dry, and intact. ? ?Sinclair Ship, RN ?

## 2022-02-24 ENCOUNTER — Observation Stay (HOSPITAL_COMMUNITY): Payer: Self-pay

## 2022-02-24 ENCOUNTER — Encounter (HOSPITAL_COMMUNITY): Payer: Self-pay

## 2022-02-24 ENCOUNTER — Other Ambulatory Visit: Payer: Self-pay

## 2022-02-24 ENCOUNTER — Inpatient Hospital Stay (HOSPITAL_COMMUNITY)
Admission: EM | Admit: 2022-02-24 | Discharge: 2022-02-25 | DRG: 433 | Discharge status: Against Medical Advice | Payer: Self-pay | Attending: Internal Medicine | Admitting: Internal Medicine

## 2022-02-24 DIAGNOSIS — Z720 Tobacco use: Secondary | ICD-10-CM | POA: Diagnosis present

## 2022-02-24 DIAGNOSIS — F319 Bipolar disorder, unspecified: Secondary | ICD-10-CM | POA: Diagnosis present

## 2022-02-24 DIAGNOSIS — Z8673 Personal history of transient ischemic attack (TIA), and cerebral infarction without residual deficits: Secondary | ICD-10-CM

## 2022-02-24 DIAGNOSIS — K7290 Hepatic failure, unspecified without coma: Secondary | ICD-10-CM | POA: Diagnosis present

## 2022-02-24 DIAGNOSIS — K7460 Unspecified cirrhosis of liver: Secondary | ICD-10-CM

## 2022-02-24 DIAGNOSIS — D649 Anemia, unspecified: Secondary | ICD-10-CM | POA: Diagnosis present

## 2022-02-24 DIAGNOSIS — E876 Hypokalemia: Secondary | ICD-10-CM | POA: Diagnosis present

## 2022-02-24 DIAGNOSIS — R1900 Intra-abdominal and pelvic swelling, mass and lump, unspecified site: Principal | ICD-10-CM

## 2022-02-24 DIAGNOSIS — F1020 Alcohol dependence, uncomplicated: Secondary | ICD-10-CM | POA: Diagnosis present

## 2022-02-24 DIAGNOSIS — Z79899 Other long term (current) drug therapy: Secondary | ICD-10-CM

## 2022-02-24 DIAGNOSIS — K7031 Alcoholic cirrhosis of liver with ascites: Principal | ICD-10-CM | POA: Diagnosis present

## 2022-02-24 DIAGNOSIS — F17210 Nicotine dependence, cigarettes, uncomplicated: Secondary | ICD-10-CM | POA: Diagnosis present

## 2022-02-24 DIAGNOSIS — K7030 Alcoholic cirrhosis of liver without ascites: Secondary | ICD-10-CM

## 2022-02-24 DIAGNOSIS — D689 Coagulation defect, unspecified: Secondary | ICD-10-CM | POA: Diagnosis present

## 2022-02-24 DIAGNOSIS — D638 Anemia in other chronic diseases classified elsewhere: Secondary | ICD-10-CM | POA: Diagnosis present

## 2022-02-24 DIAGNOSIS — F1010 Alcohol abuse, uncomplicated: Secondary | ICD-10-CM | POA: Diagnosis present

## 2022-02-24 DIAGNOSIS — R601 Generalized edema: Secondary | ICD-10-CM | POA: Diagnosis present

## 2022-02-24 DIAGNOSIS — D696 Thrombocytopenia, unspecified: Secondary | ICD-10-CM | POA: Diagnosis present

## 2022-02-24 LAB — PROTIME-INR
INR: 1.5 — ABNORMAL HIGH (ref 0.8–1.2)
Prothrombin Time: 18 seconds — ABNORMAL HIGH (ref 11.4–15.2)

## 2022-02-24 LAB — CBC WITH DIFFERENTIAL/PLATELET
Abs Immature Granulocytes: 0.03 10*3/uL (ref 0.00–0.07)
Basophils Absolute: 0.1 10*3/uL (ref 0.0–0.1)
Basophils Relative: 1 %
Eosinophils Absolute: 0.4 10*3/uL (ref 0.0–0.5)
Eosinophils Relative: 4 %
HCT: 33.8 % — ABNORMAL LOW (ref 39.0–52.0)
Hemoglobin: 12.4 g/dL — ABNORMAL LOW (ref 13.0–17.0)
Immature Granulocytes: 0 %
Lymphocytes Relative: 26 %
Lymphs Abs: 2.3 10*3/uL (ref 0.7–4.0)
MCH: 36.4 pg — ABNORMAL HIGH (ref 26.0–34.0)
MCHC: 36.7 g/dL — ABNORMAL HIGH (ref 30.0–36.0)
MCV: 99.1 fL (ref 80.0–100.0)
Monocytes Absolute: 0.9 10*3/uL (ref 0.1–1.0)
Monocytes Relative: 11 %
Neutro Abs: 5.2 10*3/uL (ref 1.7–7.7)
Neutrophils Relative %: 58 %
Platelets: 108 10*3/uL — ABNORMAL LOW (ref 150–400)
RBC: 3.41 MIL/uL — ABNORMAL LOW (ref 4.22–5.81)
RDW: 17.2 % — ABNORMAL HIGH (ref 11.5–15.5)
WBC: 8.9 10*3/uL (ref 4.0–10.5)
nRBC: 0 % (ref 0.0–0.2)

## 2022-02-24 LAB — AMMONIA: Ammonia: 12 umol/L (ref 9–35)

## 2022-02-24 LAB — BASIC METABOLIC PANEL
Anion gap: 9 (ref 5–15)
BUN: 9 mg/dL (ref 6–20)
CO2: 26 mmol/L (ref 22–32)
Calcium: 8.4 mg/dL — ABNORMAL LOW (ref 8.9–10.3)
Chloride: 99 mmol/L (ref 98–111)
Creatinine, Ser: 0.62 mg/dL (ref 0.61–1.24)
GFR, Estimated: >60 mL/min (ref >60)
Glucose, Bld: 92 mg/dL (ref 70–99)
Potassium: 3.4 mmol/L — ABNORMAL LOW (ref 3.5–5.1)
Sodium: 134 mmol/L — ABNORMAL LOW (ref 135–145)

## 2022-02-24 LAB — MAGNESIUM: Magnesium: 1.8 mg/dL (ref 1.7–2.4)

## 2022-02-24 LAB — HEPATIC FUNCTION PANEL
ALT: 18 U/L (ref 0–44)
AST: 78 U/L — ABNORMAL HIGH (ref 15–41)
Albumin: 2.8 g/dL — ABNORMAL LOW (ref 3.5–5.0)
Alkaline Phosphatase: 122 U/L (ref 38–126)
Bilirubin, Direct: 1.8 mg/dL — ABNORMAL HIGH (ref 0.0–0.2)
Indirect Bilirubin: 2.9 mg/dL — ABNORMAL HIGH (ref 0.3–0.9)
Total Bilirubin: 4.7 mg/dL — ABNORMAL HIGH (ref 0.3–1.2)
Total Protein: 7.2 g/dL (ref 6.5–8.1)

## 2022-02-24 MED ORDER — LIDOCAINE HCL 1 % IJ SOLN
INTRAMUSCULAR | Status: AC
  Filled 2022-02-24: qty 20

## 2022-02-24 MED ORDER — FOLIC ACID 1 MG PO TABS
1.0000 mg | ORAL_TABLET | Freq: Every day | ORAL | Status: DC
  Administered 2022-02-24 – 2022-02-25 (×2): 1 mg via ORAL
  Filled 2022-02-24 (×2): qty 1

## 2022-02-24 MED ORDER — ALBUMIN HUMAN 25 % IV SOLN
12.5000 g | INTRAVENOUS | Status: AC
  Administered 2022-02-24: 12.5 g via INTRAVENOUS
  Filled 2022-02-24 (×2): qty 50

## 2022-02-24 MED ORDER — POTASSIUM CHLORIDE CRYS ER 20 MEQ PO TBCR
40.0000 meq | EXTENDED_RELEASE_TABLET | Freq: Every day | ORAL | Status: DC
  Administered 2022-02-24 – 2022-02-25 (×2): 40 meq via ORAL
  Filled 2022-02-24 (×2): qty 2

## 2022-02-24 MED ORDER — THIAMINE HCL 100 MG PO TABS
100.0000 mg | ORAL_TABLET | Freq: Every day | ORAL | Status: DC
  Administered 2022-02-24 – 2022-02-25 (×2): 100 mg via ORAL
  Filled 2022-02-24 (×2): qty 1

## 2022-02-24 MED ORDER — FUROSEMIDE 40 MG PO TABS
20.0000 mg | ORAL_TABLET | Freq: Every day | ORAL | Status: DC
  Administered 2022-02-25: 20 mg via ORAL
  Filled 2022-02-24: qty 1

## 2022-02-24 MED ORDER — THIAMINE HCL 100 MG/ML IJ SOLN: 100.0000 mg | Freq: Every day | INTRAMUSCULAR | Status: DC

## 2022-02-24 MED ORDER — ONDANSETRON HCL 4 MG PO TABS: 4.0000 mg | ORAL_TABLET | Freq: Four times a day (QID) | ORAL | Status: DC | PRN

## 2022-02-24 MED ORDER — LORAZEPAM 1 MG PO TABS
1.0000 mg | ORAL_TABLET | ORAL | Status: DC | PRN
  Administered 2022-02-25: 1 mg via ORAL
  Administered 2022-02-25: 2 mg via ORAL
  Filled 2022-02-24: qty 1
  Filled 2022-02-24: qty 2

## 2022-02-24 MED ORDER — LORAZEPAM 2 MG/ML IJ SOLN: 1.0000 mg | INTRAMUSCULAR | Status: DC | PRN

## 2022-02-24 MED ORDER — ADULT MULTIVITAMIN W/MINERALS CH
1.0000 | ORAL_TABLET | Freq: Every day | ORAL | Status: DC
  Administered 2022-02-24 – 2022-02-25 (×2): 1 via ORAL
  Filled 2022-02-24 (×2): qty 1

## 2022-02-24 MED ORDER — ONDANSETRON HCL 4 MG/2ML IJ SOLN: 4.0000 mg | Freq: Four times a day (QID) | INTRAMUSCULAR | Status: DC | PRN

## 2022-02-24 NOTE — Procedures (Signed)
PROCEDURE SUMMARY: ? ?Successful US guided paracentesis from RLQ.  ?Yielded 2.0 of yellow fluid.  ?No immediate complications.  ?Pt tolerated well.  ? ?Specimen not sent for labs. ? ?EBL < 44mL ? ?Emani Taussig PA-C ?02/24/2022 ?1:09 PM ? ? ? ?

## 2022-02-24 NOTE — Progress Notes (Signed)
?   02/24/22 1345  ?Clinical Encounter Type  ?Visited With Patient  ?Visit Type Follow-up;Spiritual support;Social support  ? ?Chaplain Burris returned to Pt to see that he was well situated on the unit and to offer some additional f/u support. Chaplain provided active listening and facilitated further conversation about coping. Visit seemed to be helpful and welcome. ?

## 2022-02-24 NOTE — H&P (Addendum)
?History and Physical  ? ? ?Patient: Nathaniel Warren [id number] DOB: May 05, 1966 ?DOA: 02/24/2022 ?DOS: the patient was seen and examined on 02/24/2022 ?PCP: Pcp, No  ?Patient coming from: Home ? ?Chief Complaint:  ?Chief Complaint  ?Patient presents with  ? Abdominal swelling  ? ?HPI: Nathaniel Warren is a 56 y.o. male with medical history significant of liver failure/cirrhosis, EtOH abuse, tobacco abuse, bipolar d/o. Presenting with abdominal swelling/pain. Recently admitted for the same. Left AMA yesterday d/t family obligations. Returns today w/ continued swelling and abdominal pain for which he has not tried any additional medical therapies. He denies any acute events during the time he was out of the hospital. He denies any other aggravating or alleviating factors.   ? ?Review of Systems: As mentioned in the history of present illness. All other systems reviewed and are negative. ?Past Medical History:  ?Diagnosis Date  ? Bipolar 1 disorder ([place])   ? Depression   ? Hernia, abdominal   ? Seizures ([place])   ? Stroke [facility])   ? ?Past Surgical History:  ?Procedure Laterality Date  ? ANKLE SURGERY    ? ANKLE SURGERY Right   ? SPLENECTOMY    ? ?Social History:  reports that he has been smoking cigarettes. He has a 30.00 pack-year smoking history. He has quit using smokeless tobacco. He reports current alcohol use. He reports current drug use. Drug: Marijuana. ? ?Allergies  ?Allergen Reactions  ? Asa [Aspirin] Other (See Comments)  ?  As a child, had nose bleed.   ? ? ?Family History  ?Problem Relation Age of Onset  ? COPD Mother   ? Diabetes Father   ? Hypertension Other   ? ? ?Prior to Admission medications   ?Medication Sig Start Date End Date Taking? Authorizing Provider  ?divalproex (DEPAKOTE) 500 MG DR tablet Take 1 tablet (500 mg total) by mouth every 12 (twelve) hours. For mood control ?Patient not taking: Reported on 02/21/2022 11/07/17   Money, Lowry Ram, FNP  ?furosemide (LASIX) 20 MG tablet Take 1 tablet (20 mg  total) by mouth daily for 5 days. ?Patient not taking: Reported on 02/21/2022 05/28/21 06/02/21  Lorayne Bender, PA-C  ?gabapentin (NEURONTIN) 300 MG capsule Take 1 capsule (300 mg total) by mouth 3 (three) times daily. For withdrawals ?Patient not taking: Reported on 02/21/2022 11/07/17   Money, Lowry Ram, FNP  ?QUEtiapine (SEROQUEL) 200 MG tablet Take 1 tablet (200 mg total) by mouth at bedtime. For mood control ?Patient not taking: Reported on 02/21/2022 11/07/17   Money, Lowry Ram, FNP  ? ? ?Physical Exam: ?Vitals:  ? 02/24/22 0818 02/24/22 0900 02/24/22 0930  ?BP: 99/70 95/62 92/62   ?Pulse: 74 62 (!) 55  ?Resp: 18 17 16   ?Temp: (!) 97.4 ?F (36.3 ?C)    ?TempSrc: Oral    ?SpO2: 98% 98% 99%  ? ?General: 56 y.o. male resting in bed in NAD ?Eyes: PERRL, normal sclera ?ENMT: Nares patent w/o discharge, orophaynx clear, dentition normal, ears w/o discharge/lesions/ulcers ?Neck: Supple, trachea midline ?Cardiovascular: RRR, +S1, S2, no m/g/r, equal pulses throughout ?Respiratory: CTABL, no w/r/r, normal WOB ?GI: BS+, NDNT, no masses noted, no organomegaly noted ?MSK: No c/c; BLE edema ?Neuro: A&O x 3, no focal deficits ?Psyc: Appropriate interaction and affect, calm/cooperative ? ?Data Reviewed: ? ?K+  3.4 ?Albumin 2.8 ?AST 78 ?ALT 18 ?Total bili 4.7 ?Hgb 12.4 ?Plt 108 ? ?Assessment and Plan: ?No notes have been filed under this hospital service. ?Service: Hospitalist ?Anasarca ?Liver Failure ?    -  place in obs, tele ?    - paracentesis ordered; give albumin after ?    - needs PCP and GI follow up; consult TOC ?    - His BP is soft right now, will continue his low dose lasix at admission, resume spironolactone as BP tolerates ? ?Anemia of chronic disease ?Thrombocytopenia ?    - no evidence of bleed, follow ? ?Elevated INR/coagulopathy ?    - secondary to liver failure; no evidence of bleed, follow ? ?Hypokalemia ?    - replace K+; Mg2+ is ok ? ?EtOH abuse w/ history of withdrawal seizures ?    - CIWA ?    - counseled against  further use ? ?Tobacco abuse ?    - counseled against further use ? ? Advance Care Planning:   Code Status: FULL ? ?Consults: None ? ?Family Communication: None at bedside ? ?Severity of Illness: ?The appropriate patient status for this patient is OBSERVATION. Observation status is judged to be reasonable and necessary in order to provide the required intensity of service to ensure the patient's safety. The patient's presenting symptoms, physical exam findings, and initial radiographic and laboratory data in the context of their medical condition is felt to place them at decreased risk for further clinical deterioration. Furthermore, it is anticipated that the patient will be medically stable for discharge from the hospital within 2 midnights of admission.  ? ?Author: ?Jonnie Finner, DO ?02/24/2022 9:51 AM ? ?For on call review [email or web address].  ?

## 2022-02-24 NOTE — Plan of Care (Signed)

## 2022-02-24 NOTE — Progress Notes (Signed)
?   02/24/22 0935  ?Clinical Encounter Type  ?Visited With Patient  ?Visit Type Initial;Spiritual support;Social support  ?Referral From Nurse  ?Consult/Referral To Chaplain  ?Spiritual Encounters  ?Spiritual Needs Grief support  ? ?Chaplain Amy Burris responded to consult request for spiritual care. ? ?Luna Fuse offered a compassionate, non-anxious presence and invited Pt to share feelings relating to his current health and also to the need to step away from receiving health care on 3/19 in order to be present for his granddaughter's funeral. Luna Fuse invited processing of grief and storytelling about their relationship. Chaplain Burris offered validation of emotions and active listening. Chaplain B also affirmed Pt's beliefs and encouraged ways to support healing from grief including use of prayer. ? ?Pt stated that he welcomed this visit and relief at this opportunity to express himself. Chaplain B stated that she would plan to return for a brief f/u later this afternoon if possible. ?

## 2022-02-24 NOTE — ED Provider Notes (Signed)
? [facility] ?Provider Note ? ? ?CSN: [id number] ?Arrival date & time: 02/24/22  [id number] ? ?  ? ?History ? ?Chief Complaint  ?Patient presents with  ? Abdominal swelling  ? ? ?Nathaniel Warren is a 56 y.o. male. ? ?HPI ? ?56 year old male with medical history significant for seizures, CVA, bipolar disorder, EtOH abuse, tobacco abuse, new diagnosis of liver cirrhosis with ascites, recently hospitalized from 3/17 to 3/19 for ascites who left AMA to go to a funeral who returns back to the emergency department with a chief complaint of abdominal swelling.  The patient states that he was in the hospital and had an extensive work-up for new ascites.  Symptoms been ongoing for 6 months.  He had an ultrasound of the right upper quadrant and a paracentesis that was performed on 02/21/2022 in addition to a CT abdomen pelvis without contrast.  An MR liver with and without contrast was performed yesterday 02/23/2022 and did not reveal any evidence of hepatocellular carcinoma but was motion limited.  An paracentesis did remove ascites.  Plan had been for repeat paracentesis today to remove further fluid.  The patient left AMA because he had to attend a funeral and promised to return back to the hospital for repeat admission and further work-up. He states that his abdominal discomfort is improved after his paracentesis on the 17th. He denies and fevers, chills, or new abdominal pain.  ? ?Home Medications ?Prior to Admission medications   ?Medication Sig Start Date End Date Taking? Authorizing Provider  ?divalproex (DEPAKOTE) 500 MG DR tablet Take 1 tablet (500 mg total) by mouth every 12 (twelve) hours. For mood control ?Patient not taking: Reported on 02/21/2022 11/07/17   Money, Gerlene Burdockravis B, FNP  ?furosemide (LASIX) 20 MG tablet Take 1 tablet (20 mg total) by mouth daily for 5 days. ?Patient not taking: Reported on 02/21/2022 05/28/21 06/02/21  Anselm PancoastJoy, Shawn C, PA-C  ?gabapentin (NEURONTIN) 300 MG capsule Take 1  capsule (300 mg total) by mouth 3 (three) times daily. For withdrawals ?Patient not taking: Reported on 02/21/2022 11/07/17   Money, Gerlene Burdockravis B, FNP  ?QUEtiapine (SEROQUEL) 200 MG tablet Take 1 tablet (200 mg total) by mouth at bedtime. For mood control ?Patient not taking: Reported on 02/21/2022 11/07/17   Money, Gerlene Burdockravis B, FNP  ?   ? ?Allergies    ?Asa [aspirin]   ? ?Review of Systems   ?Review of Systems  ?All other systems reviewed and are negative. ? ?Physical Exam ?Updated Vital Signs ?BP (!) 91/56 (BP Location: Left Arm)   Pulse 62   Temp 97.9 ?F (36.6 ?C) (Oral)   Resp 15   SpO2 95%  ?Physical Exam ?Vitals and nursing note reviewed.  ?Constitutional:   ?   General: He is not in acute distress. ?   Appearance: He is well-developed.  ?HENT:  ?   Head: Normocephalic and atraumatic.  ?Eyes:  ?   Conjunctiva/sclera: Conjunctivae normal.  ?Cardiovascular:  ?   Rate and Rhythm: Normal rate and regular rhythm.  ?   Heart sounds: No murmur heard. ?Pulmonary:  ?   Effort: Pulmonary effort is normal. No respiratory distress.  ?   Breath sounds: Normal breath sounds.  ?Abdominal:  ?   General: There is distension.  ?   Palpations: Abdomen is soft. There is fluid wave.  ?   Tenderness: There is no abdominal tenderness.  ?Musculoskeletal:     ?   General: No swelling.  ?   Cervical  back: Neck supple.  ?Skin: ?   General: Skin is warm and dry.  ?   Capillary Refill: Capillary refill takes less than 2 seconds.  ?Neurological:  ?   Mental Status: He is alert.  ?Psychiatric:     ?   Mood and Affect: Mood normal.  ? ? ?ED Results / Procedures / Treatments   ?Labs ?(all labs ordered are listed, but only abnormal results are displayed) ?Labs Reviewed  ?CBC WITH DIFFERENTIAL/PLATELET - Abnormal; Notable for the following components:  ?    Result Value  ? RBC 3.41 (*)   ? Hemoglobin 12.4 (*)   ? HCT 33.8 (*)   ? MCH 36.4 (*)   ? MCHC 36.7 (*)   ? RDW 17.2 (*)   ? Platelets 108 (*)   ? All other components within normal limits  ?BASIC  METABOLIC PANEL - Abnormal; Notable for the following components:  ? Sodium 134 (*)   ? Potassium 3.4 (*)   ? Calcium 8.4 (*)   ? All other components within normal limits  ?HEPATIC FUNCTION PANEL - Abnormal; Notable for the following components:  ? Albumin 2.8 (*)   ? AST 78 (*)   ? Total Bilirubin 4.7 (*)   ? Bilirubin, Direct 1.8 (*)   ? Indirect Bilirubin 2.9 (*)   ? All other components within normal limits  ?PROTIME-INR - Abnormal; Notable for the following components:  ? Prothrombin Time 18.0 (*)   ? INR 1.5 (*)   ? All other components within normal limits  ?AMMONIA  ?MAGNESIUM  ?RAPID URINE DRUG SCREEN, HOSP PERFORMED  ? ? ?EKG ?None ? ?Radiology ?MR LIVER W WO CONTRAST ? ?Result Date: 02/23/2022 ?CLINICAL DATA:  Cirrhosis.  Tumor screening. EXAM: MRI ABDOMEN WITHOUT AND WITH CONTRAST TECHNIQUE: Multiplanar multisequence MR imaging of the abdomen was performed both before and after the administration of intravenous contrast. CONTRAST:  71mL GADAVIST GADOBUTROL 1 MMOL/ML IV SOLN COMPARISON:  Abdomen pelvis CT 02/21/2022 FINDINGS: Lower chest: Unremarkable. Hepatobiliary: Postcontrast images are degraded by motion artifact. Nodular liver contour compatible with the reported clinical history of cirrhosis. Diffuse loss of signal intensity in the liver parenchyma on out of phase T1 imaging is compatible with fatty deposition. No focal areas of restricted perfusion within the liver parenchyma. Postcontrast imaging shows no areas of arterial phase hyperenhancement within the liver. No evidence for gallstones. No intrahepatic or extrahepatic biliary dilation. Pancreas: No focal mass lesion. No dilatation of the main duct. No intraparenchymal cyst. No peripancreatic edema. Of note, delayed postcontrast imaging is obscured by motion artifact. Spleen: Spleen has signal intensity at or minimally lower than skeletal muscle on T2 imaging raising the question of iron deposition disease. Adrenals/Urinary Tract: No adrenal  nodule or mass. Kidneys unremarkable. Stomach/Bowel: Stomach is decompressed. Duodenum is normally positioned as is the ligament of Treitz. No small bowel or colonic dilatation within the visualized abdomen. Vascular/Lymphatic: No abdominal aortic aneurysm. There is no gastrohepatic or hepatoduodenal ligament lymphadenopathy. No retroperitoneal or mesenteric lymphadenopathy. Other:  Moderate to large volume ascites. Musculoskeletal: No focal suspicious marrow enhancement within the visualized bony anatomy. IMPRESSION: 1. Motion degraded study. Nodular liver contour compatible with the reported clinical history of cirrhosis. No focal worrisome hepatic mass to suggest hepatocellular carcinoma. 2. Moderate to large volume ascites. Electronically Signed   By: Kennith Center M.D.   On: 02/23/2022 11:09  ? ?US Paracentesis ? ?Result Date: 02/24/2022 ?INDICATION: Recurrent ascites EXAM: ULTRASOUND GUIDED RLQ PARACENTESIS MEDICATIONS: None. COMPLICATIONS: None immediate.  PROCEDURE: Informed written consent was obtained from the patient after a discussion of the risks, benefits and alternatives to treatment. A timeout was performed prior to the initiation of the procedure. Initial ultrasound scanning demonstrates a moderate amount of ascites within the right lower abdominal quadrant. The right lower abdomen was prepped and draped in the usual sterile fashion. 1% lidocaine was used for local anesthesia. Following this, a 19 gauge, 7-cm, Yueh catheter was introduced. An ultrasound image was saved for documentation purposes. The paracentesis was performed. The catheter was removed and a dressing was applied. The patient tolerated the procedure well without immediate post procedural complication. FINDINGS: A total of approximately 2 L of yellow ascitic fluid was removed. IMPRESSION: Successful ultrasound-guided paracentesis yielding 2 liters of peritoneal fluid. Above procedure performed by Boisseau, PA. Electronically Signed    By: Simonne Come M.D.   On: 02/24/2022 13:15   ? ?Procedures ?Procedures  ? ? ?Medications Ordered in ED ?Medications  ?albumin human 25 % solution 12.5 g (has no administration in time range)  ?ondansetron

## 2022-02-24 NOTE — ED Triage Notes (Signed)
Pt presents with c/o recently being admitted to the hospital and reports that the doctor called him back and told him he needed to return. Pt left because he had a funeral to go to and reports he was supposed to stay for an MRI and to have fluid drained off of his abdomen.  ?

## 2022-02-25 LAB — COMPREHENSIVE METABOLIC PANEL
ALT: 19 U/L (ref 0–44)
AST: 75 U/L — ABNORMAL HIGH (ref 15–41)
Albumin: 2.5 g/dL — ABNORMAL LOW (ref 3.5–5.0)
Alkaline Phosphatase: 124 U/L (ref 38–126)
Anion gap: 6 (ref 5–15)
BUN: 11 mg/dL (ref 6–20)
CO2: 27 mmol/L (ref 22–32)
Calcium: 8.5 mg/dL — ABNORMAL LOW (ref 8.9–10.3)
Chloride: 102 mmol/L (ref 98–111)
Creatinine, Ser: 0.76 mg/dL (ref 0.61–1.24)
GFR, Estimated: >60 mL/min (ref >60)
Glucose, Bld: 107 mg/dL — ABNORMAL HIGH (ref 70–99)
Potassium: 3.8 mmol/L (ref 3.5–5.1)
Sodium: 135 mmol/L (ref 135–145)
Total Bilirubin: 4.6 mg/dL — ABNORMAL HIGH (ref 0.3–1.2)
Total Protein: 6.5 g/dL (ref 6.5–8.1)

## 2022-02-25 LAB — RAPID URINE DRUG SCREEN, HOSP PERFORMED
Amphetamines: NOT DETECTED
Barbiturates: NOT DETECTED
Benzodiazepines: POSITIVE — AB
Cocaine: NOT DETECTED
Opiates: NOT DETECTED
Tetrahydrocannabinol: POSITIVE — AB

## 2022-02-25 LAB — CBC
HCT: 31.3 % — ABNORMAL LOW (ref 39.0–52.0)
Hemoglobin: 11.5 g/dL — ABNORMAL LOW (ref 13.0–17.0)
MCH: 36.2 pg — ABNORMAL HIGH (ref 26.0–34.0)
MCHC: 36.7 g/dL — ABNORMAL HIGH (ref 30.0–36.0)
MCV: 98.4 fL (ref 80.0–100.0)
Platelets: 104 10*3/uL — ABNORMAL LOW (ref 150–400)
RBC: 3.18 MIL/uL — ABNORMAL LOW (ref 4.22–5.81)
RDW: 17.4 % — ABNORMAL HIGH (ref 11.5–15.5)
WBC: 10.1 10*3/uL (ref 4.0–10.5)
nRBC: 0 % (ref 0.0–0.2)

## 2022-02-25 MED ORDER — SPIRONOLACTONE 25 MG PO TABS
50.0000 mg | ORAL_TABLET | Freq: Every day | ORAL | Status: DC
  Administered 2022-02-25: 50 mg via ORAL
  Filled 2022-02-25: qty 2

## 2022-02-25 NOTE — Progress Notes (Signed)
Nutrition Education Note ? ?RD consulted for nutrition education regarding new diagnosis of liver cirrhosis. ? ?RD provided "Low Sodium Nutrition Therapy" handout from the Academy of [employer]. Reviewed patient's dietary recall. Provided examples on ways to decrease sodium intake in diet. Discouraged intake of processed foods and use of salt shaker. Encouraged fresh fruits and vegetables as well as whole grain sources of carbohydrates to maximize fiber intake.  ? ?RD discussed why it is important for patient to adhere to diet recommendations, and emphasized foods to avoid. Teach back method used. ? ?Expect good compliance. Pt appreciative of information. Reports eating hot dogs and french fries for lunch most days. We discussed other lunch options. ? ?Body mass index is 23.77 kg/m?Marland Kitchen Pt meets criteria for normal based on current BMI. ? ?Current diet order is 2g sodium, patient is consuming approximately 95% of meals at this time. Labs and medications reviewed. No further nutrition interventions warranted at this time.  If additional nutrition issues arise, please re-consult RD.  ? ?Tilda Franco, MS, RD, LDN ?Inpatient Clinical Dietitian ?Contact information available via Amion ?  ?

## 2022-02-25 NOTE — Discharge Summary (Signed)
? ? ? ?                                               Against Medical Advice ?Patient at this time expresses desire to leave the Hospital immidiately, patient has been warned that this is not Medically advisable at this time, and can result in Medical complications like Death and Disability, patient understands and accepts the risks involved and assumes full responsibilty of this decision. ? ?This patient has also been advised that if they feel the need for further medical assistance to return to any available ER or dial [phone]. ? ?Informed by Nursing staff that this patient has left care and has signed the form  Against Medical Advice on 02/25/2022 at 4:37 PM ? ?Dr Lyda Jester, Joseph Art ?[facility] ?[place] ?Time spent 15 minutes ?  ?

## 2022-02-26 LAB — AEROBIC/ANAEROBIC CULTURE W GRAM STAIN (SURGICAL/DEEP WOUND): Culture: NO GROWTH

## 2022-03-05 ENCOUNTER — Other Ambulatory Visit: Payer: Self-pay

## 2022-03-05 ENCOUNTER — Ambulatory Visit (INDEPENDENT_AMBULATORY_CARE_PROVIDER_SITE_OTHER): Payer: Self-pay | Admitting: Nurse Practitioner

## 2022-03-05 ENCOUNTER — Encounter: Payer: Self-pay | Admitting: Nurse Practitioner

## 2022-03-05 VITALS — BP 115/72 | HR 82

## 2022-03-05 DIAGNOSIS — K7290 Hepatic failure, unspecified without coma: Secondary | ICD-10-CM

## 2022-03-05 DIAGNOSIS — F32A Depression, unspecified: Secondary | ICD-10-CM

## 2022-03-05 DIAGNOSIS — R601 Generalized edema: Secondary | ICD-10-CM

## 2022-03-05 DIAGNOSIS — F419 Anxiety disorder, unspecified: Secondary | ICD-10-CM

## 2022-03-05 MED ORDER — QUETIAPINE FUMARATE 200 MG PO TABS: 200.0000 mg | ORAL_TABLET | Freq: Every day | ORAL | 0 refills | Status: DC

## 2022-03-05 MED ORDER — DIVALPROEX SODIUM 500 MG PO DR TAB: 500.0000 mg | DELAYED_RELEASE_TABLET | Freq: Two times a day (BID) | ORAL | 0 refills | Status: DC

## 2022-03-05 MED ORDER — FUROSEMIDE 20 MG PO TABS: 20.0000 mg | ORAL_TABLET | Freq: Every day | ORAL | 0 refills | Status: DC

## 2022-03-05 NOTE — Patient Instructions (Signed)
1. Anasarca ? ?- furosemide (LASIX) 20 MG tablet; Take 1 tablet (20 mg total) by mouth daily.  Dispense: 30 tablet; Refill: 0 ?- Ambulatory referral to Gastroenterology ? ?2. Liver failure without hepatic coma, unspecified chronicity (HCC) ? ?- furosemide (LASIX) 20 MG tablet; Take 1 tablet (20 mg total) by mouth daily.  Dispense: 30 tablet; Refill: 0 ?- Ambulatory referral to Gastroenterology ? ?3. Anxiety and depression ? ?- Ambulatory referral to Psychiatry ?- QUEtiapine (SEROQUEL) 200 MG tablet; Take 1 tablet (200 mg total) by mouth at bedtime. For mood control  Dispense: 30 tablet; Refill: 0 ?- divalproex (DEPAKOTE) 500 MG DR tablet; Take 1 tablet (500 mg total) by mouth every 12 (twelve) hours. For mood control  Dispense: 60 tablet; Refill: 0 ? ?Follow up: ? ?Follow up in 1 month or sooner if needed ? ?

## 2022-03-05 NOTE — Assessment & Plan Note (Signed)
-   furosemide (LASIX) 20 MG tablet; Take 1 tablet (20 mg total) by mouth daily.  Dispense: 30 tablet; Refill: 0 ?- Ambulatory referral to Gastroenterology ? ?2. Liver failure without hepatic coma, unspecified chronicity ([place]) ? ?- furosemide (LASIX) 20 MG tablet; Take 1 tablet (20 mg total) by mouth daily.  Dispense: 30 tablet; Refill: 0 ?- Ambulatory referral to Gastroenterology ? ?3. Anxiety and depression ? ?- Ambulatory referral to Psychiatry ?- QUEtiapine (SEROQUEL) 200 MG tablet; Take 1 tablet (200 mg total) by mouth at bedtime. For mood control  Dispense: 30 tablet; Refill: 0 ?- divalproex (DEPAKOTE) 500 MG DR tablet; Take 1 tablet (500 mg total) by mouth every 12 (twelve) hours. For mood control  Dispense: 60 tablet; Refill: 0 ? ?Follow up: ? ?Follow up in 1 month or sooner if needed ? ?

## 2022-03-05 NOTE — Progress Notes (Signed)
@Patient  ID: Nathaniel Warren, male    DOB: Jul 23, 1966, 56 y.o.   MRN: [id number] ? ?Chief Complaint  ?Patient presents with  ? Hospitalization Follow-up  ? ? ?Referring provider: ?No ref. provider found ? ?HPI ? ?Patient presents today for hospital follow-up.  Patient was recently seen in the ED twice in the past couple weeks.  He was seen for liver failure/cirrhosis and abdominal swelling and pain.  He left AMA on both occasions.  He did have paracentesis teases completed on 02/24/2022.  It was noted in ED visit the patient will need a follow-up with GI specialist.  Patient was to be started back on Lasix daily.Marland Kitchen  He left before.  Prescription for this medication.  Patient does need to resume his anxiety and and depression medications.  He states that this was began handled through [facility] but he has stopped going there.  We will place a new referral for psychiatry for him for medication management.  We will refill his medications today.  Patient states that overall he is doing well since leaving the hospital but does still have abdominal swelling and some mild pain.  We will place referral to GI. Denies f/c/s, n/v/d, hemoptysis, PND, chest pain or edema. ? ? ? ? ? ? ? ?Allergies  ?Allergen Reactions  ? Asa [Aspirin] Other (See Comments)  ?  As a child, had nose bleed.   ? ? ?Immunization History  ?Administered Date(s) Administered  ? Influenza Split 01/10/2013  ? Influenza,inj,Quad PF,6+ Mos 10/25/2017  ? Pneumococcal Polysaccharide-23 10/25/2017  ? ? ?Past Medical History:  ?Diagnosis Date  ? Bipolar 1 disorder ([place])   ? Depression   ? Hernia, abdominal   ? Seizures ([place])   ? Stroke [facility])   ? ? ?Tobacco History: ?Social History  ? ?Tobacco Use  ?Smoking Status Every Day  ? Packs/day: 1.00  ? Years: 30.00  ? Pack years: 30.00  ? Types: Cigarettes  ?Smokeless Tobacco Former  ? ?Ready to quit: Not Answered ?Counseling given: Not Answered ? ? ?Outpatient Encounter Medications as of 03/05/2022  ?Medication Sig  ?  divalproex (DEPAKOTE) 500 MG DR tablet Take 1 tablet (500 mg total) by mouth every 12 (twelve) hours. For mood control  ? furosemide (LASIX) 20 MG tablet Take 1 tablet (20 mg total) by mouth daily.  ? gabapentin (NEURONTIN) 300 MG capsule Take 1 capsule (300 mg total) by mouth 3 (three) times daily. For withdrawals (Patient not taking: Reported on 02/21/2022)  ? QUEtiapine (SEROQUEL) 200 MG tablet Take 1 tablet (200 mg total) by mouth at bedtime. For mood control  ? [DISCONTINUED] divalproex (DEPAKOTE) 500 MG DR tablet Take 1 tablet (500 mg total) by mouth every 12 (twelve) hours. For mood control (Patient not taking: Reported on 02/21/2022)  ? [DISCONTINUED] furosemide (LASIX) 20 MG tablet Take 1 tablet (20 mg total) by mouth daily for 5 days. (Patient not taking: Reported on 02/21/2022)  ? [DISCONTINUED] QUEtiapine (SEROQUEL) 200 MG tablet Take 1 tablet (200 mg total) by mouth at bedtime. For mood control (Patient not taking: Reported on 02/21/2022)  ? ?No facility-administered encounter medications on file as of 03/05/2022.  ? ? ? ?Review of Systems ? ?Review of Systems  ?Constitutional: Negative.   ?HENT: Negative.    ?Cardiovascular: Negative.   ?Gastrointestinal:  Positive for abdominal distention.  ?Allergic/Immunologic: Negative.   ?Neurological: Negative.   ?Psychiatric/Behavioral: Negative.     ? ? ? ?Physical Exam ? ?BP 115/72   Pulse 82  ? ?Wt  Readings from Last 5 Encounters:  ?02/24/22 160 lb 15 oz (73 kg)  ?02/23/22 160 lb 15 oz (73 kg)  ?05/28/21 183 lb (83 kg)  ?01/26/18 175 lb (79.4 kg)  ?10/21/17 192 lb 1.6 oz (87.1 kg)  ? ? ? ?Physical Exam ?Vitals and nursing note reviewed.  ?Constitutional:   ?   General: He is not in acute distress. ?   Appearance: He is well-developed.  ?Cardiovascular:  ?   Rate and Rhythm: Normal rate and regular rhythm.  ?Pulmonary:  ?   Effort: Pulmonary effort is normal.  ?   Breath sounds: Normal breath sounds.  ?Abdominal:  ?   General: There is distension.  ?Skin: ?    General: Skin is warm and dry.  ?Neurological:  ?   Mental Status: He is alert and oriented to person, place, and time.  ? ? ? ? ?Assessment & Plan:  ? ?Anasarca ?- furosemide (LASIX) 20 MG tablet; Take 1 tablet (20 mg total) by mouth daily.  Dispense: 30 tablet; Refill: 0 ?- Ambulatory referral to Gastroenterology ? ?2. Liver failure without hepatic coma, unspecified chronicity ([place]) ? ?- furosemide (LASIX) 20 MG tablet; Take 1 tablet (20 mg total) by mouth daily.  Dispense: 30 tablet; Refill: 0 ?- Ambulatory referral to Gastroenterology ? ?3. Anxiety and depression ? ?- Ambulatory referral to Psychiatry ?- QUEtiapine (SEROQUEL) 200 MG tablet; Take 1 tablet (200 mg total) by mouth at bedtime. For mood control  Dispense: 30 tablet; Refill: 0 ?- divalproex (DEPAKOTE) 500 MG DR tablet; Take 1 tablet (500 mg total) by mouth every 12 (twelve) hours. For mood control  Dispense: 60 tablet; Refill: 0 ? ?Follow up: ? ?Follow up in 1 month or sooner if needed ? ? ? ? ?[name], NP ?03/05/2022 ? ?

## 2022-03-20 ENCOUNTER — Encounter (HOSPITAL_COMMUNITY): Payer: Self-pay

## 2022-03-20 ENCOUNTER — Emergency Department (HOSPITAL_COMMUNITY)
Admission: EM | Admit: 2022-03-20 | Discharge: 2022-03-20 | Disposition: A | Discharge status: Home / Self Care | Payer: Self-pay | Attending: Student | Admitting: Student

## 2022-03-20 ENCOUNTER — Emergency Department (HOSPITAL_COMMUNITY): Payer: Self-pay

## 2022-03-20 DIAGNOSIS — R7401 Elevation of levels of liver transaminase levels: Secondary | ICD-10-CM | POA: Insufficient documentation

## 2022-03-20 DIAGNOSIS — R791 Abnormal coagulation profile: Secondary | ICD-10-CM | POA: Insufficient documentation

## 2022-03-20 DIAGNOSIS — R748 Abnormal levels of other serum enzymes: Secondary | ICD-10-CM | POA: Insufficient documentation

## 2022-03-20 DIAGNOSIS — K7031 Alcoholic cirrhosis of liver with ascites: Secondary | ICD-10-CM

## 2022-03-20 DIAGNOSIS — F17210 Nicotine dependence, cigarettes, uncomplicated: Secondary | ICD-10-CM | POA: Insufficient documentation

## 2022-03-20 DIAGNOSIS — R140 Abdominal distension (gaseous): Secondary | ICD-10-CM | POA: Insufficient documentation

## 2022-03-20 DIAGNOSIS — R1907 Generalized intra-abdominal and pelvic swelling, mass and lump: Secondary | ICD-10-CM | POA: Insufficient documentation

## 2022-03-20 LAB — CBC
HCT: 34 % — ABNORMAL LOW (ref 39.0–52.0)
Hemoglobin: 12.6 g/dL — ABNORMAL LOW (ref 13.0–17.0)
MCH: 36.7 pg — ABNORMAL HIGH (ref 26.0–34.0)
MCHC: 37.1 g/dL — ABNORMAL HIGH (ref 30.0–36.0)
MCV: 99.1 fL (ref 80.0–100.0)
Platelets: 114 10*3/uL — ABNORMAL LOW (ref 150–400)
RBC: 3.43 MIL/uL — ABNORMAL LOW (ref 4.22–5.81)
RDW: 17.6 % — ABNORMAL HIGH (ref 11.5–15.5)
WBC: 8.8 10*3/uL (ref 4.0–10.5)
nRBC: 0 % (ref 0.0–0.2)

## 2022-03-20 LAB — COMPREHENSIVE METABOLIC PANEL
ALT: 21 U/L (ref 0–44)
AST: 79 U/L — ABNORMAL HIGH (ref 15–41)
Albumin: 2.7 g/dL — ABNORMAL LOW (ref 3.5–5.0)
Alkaline Phosphatase: 163 U/L — ABNORMAL HIGH (ref 38–126)
Anion gap: 6 (ref 5–15)
BUN: 6 mg/dL (ref 6–20)
CO2: 27 mmol/L (ref 22–32)
Calcium: 8.6 mg/dL — ABNORMAL LOW (ref 8.9–10.3)
Chloride: 104 mmol/L (ref 98–111)
Creatinine, Ser: 0.66 mg/dL (ref 0.61–1.24)
GFR, Estimated: >60 mL/min (ref >60)
Glucose, Bld: 105 mg/dL — ABNORMAL HIGH (ref 70–99)
Potassium: 3.9 mmol/L (ref 3.5–5.1)
Sodium: 137 mmol/L (ref 135–145)
Total Bilirubin: 4 mg/dL — ABNORMAL HIGH (ref 0.3–1.2)
Total Protein: 7 g/dL (ref 6.5–8.1)

## 2022-03-20 LAB — URINALYSIS, ROUTINE W REFLEX MICROSCOPIC
Bilirubin Urine: NEGATIVE
Glucose, UA: NEGATIVE mg/dL
Hgb urine dipstick: NEGATIVE
Ketones, ur: NEGATIVE mg/dL
Leukocytes,Ua: NEGATIVE
Nitrite: NEGATIVE
Protein, ur: NEGATIVE mg/dL
Specific Gravity, Urine: 1.02 (ref 1.005–1.030)
pH: 7 (ref 5.0–8.0)

## 2022-03-20 LAB — LIPASE, BLOOD: Lipase: 55 U/L — ABNORMAL HIGH (ref 11–51)

## 2022-03-20 LAB — PROTIME-INR
INR: 1.4 — ABNORMAL HIGH (ref 0.8–1.2)
Prothrombin Time: 16.7 seconds — ABNORMAL HIGH (ref 11.4–15.2)

## 2022-03-20 LAB — ETHANOL: Alcohol, Ethyl (B): <10 mg/dL (ref <10)

## 2022-03-20 MED ORDER — SODIUM CHLORIDE (PF) 0.9 % IJ SOLN
INTRAMUSCULAR | Status: AC
  Filled 2022-03-20: qty 50

## 2022-03-20 MED ORDER — IOHEXOL 300 MG/ML  SOLN
100.0000 mL | Freq: Once | INTRAMUSCULAR | Status: AC | PRN
Start: 2022-03-20 — End: 2022-03-20
  Administered 2022-03-20: 100 mL via INTRAVENOUS

## 2022-03-20 MED ORDER — LIDOCAINE HCL 1 % IJ SOLN
INTRAMUSCULAR | Status: AC
  Administered 2022-03-20: 10 mL
  Filled 2022-03-20: qty 20

## 2022-03-20 NOTE — ED Triage Notes (Signed)
Pt presents with c/o abdominal pain and swelling. Pt reports hx of liver disease. Pt also reports some swelling in his ankle and knees.  ?

## 2022-03-20 NOTE — ED Notes (Signed)
20G IV started in LAC in CT-Charge RN Mardella Layman takes responsibility as patient was returned to the waiting room post exam. ?

## 2022-03-20 NOTE — ED Provider Notes (Signed)
?[facility]-EMERGENCY DEPT ?Provider Note ? ?CSN: [id number] ?Arrival date & time: 03/20/22 [id number] ? ?Chief Complaint(s) ?Abdominal swelling ? ?HPI ?Nathaniel SpatesJames T Warren is a 56 y.o. male with PMH bipolar 1, seizures, previous CVA, alcoholic cirrhosis who presents emergency department for evaluation of abdominal swelling.  Patient states that over the course of the last 1 week he has noticed an increase and his abdominal swelling.  He states that he had to leave AMA from the hospital twice due to family tragedies and has been unable to follow-up with a GI physician, but does have a primary care physician and has an appointment next week.  Denies chest pain, shortness of breath, headache, fever or other systemic symptoms. ? ?HPI ? ?Past Medical History ?Past Medical History:  ?Diagnosis Date  ? Bipolar 1 disorder (HCC)   ? Depression   ? Hernia, abdominal   ? Seizures (HCC)   ? Stroke [facility](HCC)   ? ?Patient Active Problem List  ? Diagnosis Date Noted  ? Liver failure without hepatic coma (HCC) 03/05/2022  ? Alcoholic cirrhosis of liver with ascites (HCC) 02/24/2022  ? Anasarca 02/21/2022  ? Hypokalemia 02/21/2022  ? Hyponatremia 02/21/2022  ? Elevated LFTs 02/21/2022  ? Hyperbilirubinemia 02/21/2022  ? Elevated brain natriuretic peptide (BNP) level 02/21/2022  ? Tobacco abuse 02/21/2022  ? Alcohol abuse 02/21/2022  ? Thrombocytopenia (HCC) 02/21/2022  ? Normocytic anemia 02/21/2022  ? Multiple lacunar infarcts (HCC) 10/21/2017  ? Drug overdose, intentional, initial encounter (HCC) 10/20/2017  ? Drug overdose, intentional self-harm, initial encounter (HCC) 10/20/2017  ? Seizures (HCC) 10/20/2017  ? Drug overdose 10/20/2017  ? Alcohol dependence (HCC) 06/19/2013  ?  Class: Acute  ? 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  Class: Acute  ? GAD (generalized anxiety disorder) 04/10/2013  ? Depression 01/11/2013  ? Anxiety 01/11/2013  ? ?Home Medication(s) ?Prior to  Admission medications   ?Medication Sig Start Date End Date Taking? Authorizing Provider  ?divalproex (DEPAKOTE) 500 MG DR tablet Take 1 tablet (500 mg total) by mouth every 12 (twelve) hours. For mood control 03/05/22   Ivonne AndrewNichols, Tonya S, NP  ?furosemide (LASIX) 20 MG tablet Take 1 tablet (20 mg total) by mouth daily. 03/05/22 04/04/22  Ivonne AndrewNichols, Tonya S, NP  ?gabapentin (NEURONTIN) 300 MG capsule Take 1 capsule (300 mg total) by mouth 3 (three) times daily. For withdrawals ?Patient not taking: Reported on 02/21/2022 11/07/17   Money, Gerlene Burdockravis B, FNP  ?QUEtiapine (SEROQUEL) 200 MG tablet Take 1 tablet (200 mg total) by mouth at bedtime. For mood control 03/05/22   Ivonne AndrewNichols, Tonya S, NP  ?                                                                                                                                  ?Past Surgical History ?Past Surgical History:  ?Procedure Laterality Date  ? ANKLE SURGERY    ? ANKLE  SURGERY Right   ? SPLENECTOMY    ? ?Family History ?Family History  ?Problem Relation Age of Onset  ? COPD Mother   ? Diabetes Father   ? Hypertension Other   ? ? ?Social History ?Social History  ? ?Tobacco Use  ? Smoking status: Every Day  ?  Packs/day: 1.00  ?  Years: 30.00  ?  Pack years: 30.00  ?  Types: Cigarettes  ? Smokeless tobacco: Former  ?Vaping Use  ? Vaping Use: Never used  ?Substance Use Topics  ? Alcohol use: Yes  ?  Comment: 1/2 pint of vodka daily  ? Drug use: Yes  ?  Types: Marijuana  ? ?Allergies ?Asa [aspirin] ? ?Review of Systems ?Review of Systems  ?Gastrointestinal:  Positive for abdominal distention.  ? ?Physical Exam ?Vital Signs  ?I have reviewed the triage vital signs ?BP 114/72 (BP Location: Right Arm)   Pulse 77   Temp 98.9 ?F (37.2 ?C) (Oral)   Resp 16   SpO2 98%  ? ?Physical Exam ?Vitals and nursing note reviewed.  ?Constitutional:   ?   General: He is not in acute distress. ?   Appearance: He is well-developed.  ?HENT:  ?   Head: Normocephalic and atraumatic.  ?Eyes:  ?    Conjunctiva/sclera: Conjunctivae normal.  ?Cardiovascular:  ?   Rate and Rhythm: Normal rate and regular rhythm.  ?   Heart sounds: No murmur heard. ?Pulmonary:  ?   Effort: Pulmonary effort is normal. No respiratory distress.  ?   Breath sounds: Normal breath sounds.  ?Abdominal:  ?   General: There is distension.  ?   Palpations: Abdomen is soft.  ?   Tenderness: There is no abdominal tenderness.  ?Musculoskeletal:     ?   General: No swelling.  ?   Cervical back: Neck supple.  ?Skin: ?   General: Skin is warm and dry.  ?   Capillary Refill: Capillary refill takes less than 2 seconds.  ?Neurological:  ?   Mental Status: He is alert.  ?Psychiatric:     ?   Mood and Affect: Mood normal.  ? ? ?ED Results and Treatments ?Labs ?(all labs ordered are listed, but only abnormal results are displayed) ?Labs Reviewed  ?LIPASE, BLOOD - Abnormal; Notable for the following components:  ?    Result Value  ? Lipase 55 (*)   ? All other components within normal limits  ?COMPREHENSIVE METABOLIC PANEL - Abnormal; Notable for the following components:  ? Glucose, Bld 105 (*)   ? Calcium 8.6 (*)   ? Albumin 2.7 (*)   ? AST 79 (*)   ? Alkaline Phosphatase 163 (*)   ? Total Bilirubin 4.0 (*)   ? All other components within normal limits  ?CBC - Abnormal; Notable for the following components:  ? RBC 3.43 (*)   ? Hemoglobin 12.6 (*)   ? HCT 34.0 (*)   ? MCH 36.7 (*)   ? MCHC 37.1 (*)   ? RDW 17.6 (*)   ? Platelets 114 (*)   ? All other components within normal limits  ?ETHANOL  ?URINALYSIS, ROUTINE W REFLEX MICROSCOPIC  ?PROTIME-INR  ?                                                                                                                       ? ?  Radiology ?CT ABDOMEN PELVIS W CONTRAST ? ?Result Date: 03/20/2022 ?CLINICAL DATA:  Peritonitis. EXAM: CT ABDOMEN AND PELVIS WITH CONTRAST TECHNIQUE: Multidetector CT imaging of the abdomen and pelvis was performed using the standard protocol following bolus administration of intravenous  contrast. RADIATION DOSE REDUCTION: This exam was performed according to the departmental dose-optimization program which includes automated exposure control, adjustment of the mA and/or kV according to patient size and/or use of iterative reconstruction technique. CONTRAST:  OMNIPAQUE IOHEXOL 300 MG/ML  SOLN COMPARISON:  MR abdomen 02/23/2022, CT abdomen 02/21/2022 FINDINGS: Lower chest: Small left pleural effusion. Hepatobiliary: No focal liver abnormality is seen. Nodular contour of the liver consistent with cirrhosis. No gallstones, gallbladder wall thickening, or biliary dilatation. Pancreas: Unremarkable. No pancreatic ductal dilatation or surrounding inflammatory changes. Spleen: Prior splenectomy with residual splenic tissue/splenules remaining without focal abnormality. Adrenals/Urinary Tract: Adrenal glands are unremarkable. Kidneys are normal, without renal calculi, focal lesion, or hydronephrosis. Bladder is unremarkable. Stomach/Bowel: Stomach is within normal limits. No evidence of bowel wall thickening, distention, or inflammatory changes. Normal appendix. Diverticulosis without evidence of diverticulitis. Vascular/Lymphatic: Normal caliber abdominal aorta with mild atherosclerosis. No lymphadenopathy. Reproductive: Prostate is unremarkable. Other: Fat containing ventral abdominal hernia. Large volume ascites. Musculoskeletal: No acute osseous abnormality. No aggressive osseous lesion. Degenerative disease with disc height loss at L1-2, L2-3, L3-4, L4-5 and L5-S1. Bilateral facet arthropathy of the lumbar spine. Mild osteoarthritis of bilateral SI joints, left greater than right. IMPRESSION: 1. No evidence of bowel perforation or bowel obstruction. 2. Cirrhosis with large volume ascites. If there is clinical concern regarding peritonitis, recommend a diagnostic paracentesis. 3. Small left pleural effusion. 4. Aortic Atherosclerosis (ICD10-I70.0). Electronically Signed   By: Elige Ko M.D.    On: 03/20/2022 12:07   ? ?Pertinent labs & imaging results that were available during my care of the patient were reviewed by me and considered in my medical decision making (see MDM for details). ? ?Medications

## 2022-03-20 NOTE — Procedures (Signed)
PROCEDURE SUMMARY: ? ?Successful ultrasound guided paracentesis from the RLQ quadrant.  ?Yielded 4.9L of yellow ascitic fluid.  ?No immediate complications.  ?The patient tolerated the procedure well.  ? ?Specimen not sent for labs. ? ?EBL negligible ? ?The patient has required >/=2 paracenteses in a 30 day period and a screening evaluation by the [facility] has been arranged. ? ? ?Electronically Signed: ?Pasty Spillers, PA ?03/20/2022, 2:42 PM ? ? ?

## 2022-03-20 NOTE — Progress Notes (Signed)
? ?  Portal [facility] Clinic Screening Evaluation ? ? ?Indication for evaluation: ?Nathaniel Warren is a 56 y.o. male undergoing preliminary evaluation in the [facility] due to recurrent ascites. ? ?Referring Physician/Established Gastroenterologist: None established. ? ?Etiology of cirrhosis: EtOH ?Initially diagnosed: recent ?# of paracentesis in last month: 3 ?# of paracentesis in last 2 months: 3 ?History of hepatic hydrothorax:  No ?History of hepatic encephalopathy: No ? ?Prior evaluation for liver transplant: No ?History of hepatocellular carcinoma: No ? ?Prior esophagogastroduodenoscopy/intervention: NO ?Current esophageal varices: Yes (CT) ?Current gastric varices: Yes (CT) ?History of hematemesis: No ? ?Current diuretic regimen: furosemide 20 mg QD ?Current pharmacologic encephalopathy prophylaxis/treatment: None ? ?History of renal dysfunction: No ?History of hemodialysis: No ? ?History of cardiac dysfunction: No ? ?Other pertinent past medical history: Bipolar disorder, depression, seizures, CVA ? ? ?Imaging: ?Prior cross sectional imaging of portal system: ?03/20/22 ?Patent portal system, gastrorenal shunt with small gastroesophageal varices. ? ?Echocardiogram: 02/22/22 - Normal EF  & right heart function ? ? ?Labs: ?Creatinine: 0.66 ?Total Bilirubin: 4.0 ?INR: 1.4 ?Sodium: 137 ?Albumin: 2.7 ? ?Child-Pugh = 11 points, class C ?MELD = 15 (6.0% estimated 3 month mortality) ?[place] Index of Post-TIPS Survival (FIPS) = 0.11 (Overall survival of 95% at 1 month, 85.2% at 3 months, and 79.2% at 6 months) ? ? ? ?Assessment: ?Nathaniel Warren is a 56 y.o. male with history of alcoholic cirrhosis and recurrent ascites (Child Pugh C, MELD 15).  After preliminary evaluation, this patient is a poor candidate for TIPS creation due to minimal diuretic use in the setting of recurrent ascites.  Additionally, the patient is not under the care of a Gastroenterologist,  however it appears this has been arranged. ? ?Recommendation: ?Follow up in [facility] will be arranged in 6 months for re-evaluation. ? ? ? ?Electronically Signed: ?Suzette Battiest, MD ?03/20/2022, 4:10 PM ? ? ? ?

## 2022-03-20 NOTE — ED Notes (Signed)
Pt transported to IR for paracentesis.  

## 2022-03-20 NOTE — ED Provider Triage Note (Signed)
Emergency Medicine Provider Triage Evaluation Note ? ?Nathaniel Warren , a 56 y.o. male  was evaluated in triage.  Pt complains of worsening abdominal swelling, abdominal pain secondary to liver disease.  Patient had been admitted twice in March for similar but had to leave AMA both times secondary to a death in the family.  He reports that he is able to state today.  He is still drinking 1 mixed drink a night.  He denies any additional alcohol use.  He reports that he was started on Lasix by his primary care which she reports is only helping a small amount.  He denies any nausea, vomiting, diarrhea, chest pain, shortness of breath at this time. ? ?Review of Systems  ?Positive: Abdominal pain, swelling, peripheral edema ?Negative: Chest pain, shob, hematemesis ? ?Physical Exam  ?BP 115/75 (BP Location: Left Arm)   Pulse 80   Temp 98.9 ?F (37.2 ?C) (Oral)   Resp 16   SpO2 97%  ?Gen:   Awake, no distress   ?Resp:  Normal effort  ?MSK:   Moves extremities without difficulty  ?Other:  Patient with positive fluid wave of the abdomen.  He has some redness, tenderness with concern for possible early SBP.  He also has 2+ peripheral edema bilateral lower extremities.  He has intact DP, PT pulses bilaterally. ? ?Medical Decision Making  ?Medically screening exam initiated at 10:06 AM.  Appropriate orders placed.  Nathaniel Warren was informed that the remainder of the evaluation will be completed by another provider, this initial triage assessment does not replace that evaluation, and the importance of remaining in the ED until their evaluation is complete. ? ?Work-up initiated, some concern for early SBP ?  ?Olene Floss, PA-C ?03/20/22 1008 ? ?

## 2022-04-02 ENCOUNTER — Ambulatory Visit (INDEPENDENT_AMBULATORY_CARE_PROVIDER_SITE_OTHER): Payer: Self-pay | Admitting: Nurse Practitioner

## 2022-04-02 VITALS — BP 118/76 | HR 82 | Temp 98.4°F | Ht 68.0 in | Wt 181.4 lb

## 2022-04-02 DIAGNOSIS — M5441 Lumbago with sciatica, right side: Secondary | ICD-10-CM

## 2022-04-02 DIAGNOSIS — R601 Generalized edema: Secondary | ICD-10-CM

## 2022-04-02 DIAGNOSIS — G8929 Other chronic pain: Secondary | ICD-10-CM

## 2022-04-02 DIAGNOSIS — M5442 Lumbago with sciatica, left side: Secondary | ICD-10-CM

## 2022-04-02 DIAGNOSIS — Z87898 Personal history of other specified conditions: Secondary | ICD-10-CM

## 2022-04-02 DIAGNOSIS — K7290 Hepatic failure, unspecified without coma: Secondary | ICD-10-CM

## 2022-04-02 DIAGNOSIS — F419 Anxiety disorder, unspecified: Secondary | ICD-10-CM

## 2022-04-02 DIAGNOSIS — F32A Depression, unspecified: Secondary | ICD-10-CM

## 2022-04-02 MED ORDER — QUETIAPINE FUMARATE 200 MG PO TABS: 200.0000 mg | ORAL_TABLET | Freq: Every day | ORAL | 0 refills | Status: DC

## 2022-04-02 MED ORDER — GABAPENTIN 300 MG PO CAPS: 300.0000 mg | ORAL_CAPSULE | Freq: Three times a day (TID) | ORAL | 0 refills | Status: DC

## 2022-04-02 MED ORDER — FUROSEMIDE 20 MG PO TABS: 20.0000 mg | ORAL_TABLET | Freq: Every day | ORAL | 0 refills | Status: DC

## 2022-04-02 MED ORDER — BUSPIRONE HCL 10 MG PO TABS: 10.0000 mg | ORAL_TABLET | Freq: Two times a day (BID) | ORAL | 0 refills | Status: AC

## 2022-04-02 NOTE — Patient Instructions (Addendum)
1. History of seizures ? ?- Ambulatory referral to Neurology ? ?2. Chronic bilateral low back pain with bilateral sciatica ? ?- gabapentin (NEURONTIN) 300 MG capsule; Take 1 capsule (300 mg total) by mouth 3 (three) times daily. For withdrawals  Dispense: 90 capsule; Refill: 0 ? ?3. Anxiety and depression ? ?- busPIRone (BUSPAR) 10 MG tablet; Take 1 tablet (10 mg total) by mouth 2 (two) times daily.  Dispense: 60 tablet; Refill: 0 ? ?4. Anasarca / Ascites  ? ?- Ambulatory referral to Interventional Radiology ? ? ?5. Liver failure without hepatic coma, unspecified chronicity (HCC) ? ?- furosemide (LASIX) 20 MG tablet; Take 1 tablet (20 mg total) by mouth daily.  Dispense: 30 tablet; Refill: 0 ? ? ? ?Follow up: ? ?Follow up in 3 months or sooner if needed ? ? ? ? ?Ascites ? ?Ascites is a collection of too much fluid in the abdomen. Ascites can range from mild to severe. If ascites is not treated, it can get worse. ?What are the causes? ?This condition may be caused by: ?A liver condition called cirrhosis. This is the most common cause of ascites. ?Long-term (chronic) or alcoholic hepatitis. ?Infection or inflammation in the abdomen. ?Cancer in the abdomen. ?Heart failure. ?Kidney disease. ?Inflammation of the pancreas. ?Clots in the veins of the liver. ?What are the signs or symptoms? ?Symptoms of this condition include: ?A feeling of fullness in the abdomen. This is common. ?An increase in the size of the abdomen or waist. ?Swelling in the legs. ?Swelling of the scrotum. ?Difficulty breathing. ?Pain in the abdomen. ?Sudden weight gain. ?If the condition is mild, you may not have symptoms. ?How is this diagnosed? ?This condition is diagnosed based on your medical history and a physical exam. Your health care provider may order imaging tests, such as an ultrasound or CT scan of your abdomen. ?How is this treated? ?Treatment for this condition depends on the cause of the ascites. It may include: ?Taking a pill to make  you urinate. This is called a water pill (diuretic pill). ?Strictly reducing your salt (sodium) intake. Salt can cause extra fluid to be kept (retained) in the body, and this makes ascites worse. ?Having a procedure to remove fluid from your abdomen (paracentesis). ?Having a procedure that connects two of the major veins within your liver and relieves pressure on your liver. This is called a TIPS procedure (transjugular intrahepatic portosystemic shunt procedure). ?Placement of a drainage catheter (peritoneovenous shunt) to manage the extra fluid in the abdomen. ?Ascites may go away or improve when the condition that caused it is treated. ?Follow these instructions at home: ?Eating and drinking ?Keep track of your weight. To do this, weigh yourself at the same time every day and write down your weight. ?Try not to eat salty (high-sodium) foods. ?Follow any instructions that your health care provider gives you about how much to drink. ?Keep track of how much you drink and any changes in how much or how often you urinate. ?General instructions ?Report any changes in your health to your health care provider, especially if you develop new symptoms or your symptoms get worse. ?Take over-the-counter and prescription medicines only as told by your health care provider. ?Keep all follow-up visits. This is important. ?Contact a health care provider if: ?You gain more than 3 lb (1.36 kg) in 3 days. ?Your waist size increases. ?You have new swelling in your legs. ?The swelling in your legs gets worse. ?Get help right away if: ?You have a fever  or chills. ?You are confused. ?You have new or worsening breathing trouble. ?You have new or worsening pain in your abdomen. ?You have new or worsening swelling in the scrotum. ?Summary ?Ascites is a collection of too much fluid in the abdomen. ?Ascites may be caused by various conditions, such as cirrhosis, hepatitis, cancer, or congestive heart failure. ?Symptoms may include swelling  of the abdomen and other areas due to extra fluid in the body. ?Treatments may involve dietary changes, medicines, or procedures. ?This information is not intended to replace advice given to you by your health care provider. Make sure you discuss any questions you have with your health care provider. ?Document Revised: 08/07/2020 Document Reviewed: 08/07/2020 ?Elsevier Patient Education ? 2023 [employer] ? ? ? ? ?

## 2022-04-02 NOTE — Progress Notes (Signed)
@Patient  ID: , male    DOB: 01-13-1966, 56 y.o.   MRN: [id number] ? ?Chief Complaint  ?Patient presents with  ? Follow-up  ?  Pt is here for 4 weeks follow of cirrhosis. Pt stated he has abdominal pain. Pt is requesting refill on lasix   ? ? ?Referring provider: ?No ref. provider found ? ?HPI ? ?Patient presents today for follow-up visit.  He was last seen by me on 03/05/2022.  He has been back to the ED on 03/20/2022 for ascites requiring paracentesis with 4.5 L drawn off.  We discussed that we will try to refer patient to interventional radiology so that he can have routine paracentesis when needed without having to go to the ED.  We have placed a referral for GI as well.  They are supposed to call patient for appointment today.  Patient states that he does have an upcoming appointment with psychiatry.  Overall he is doing well today.  He does need some refills of his medications.  Patient states that he does have a history of seizures and we will refer him back to neurology for management of this. Denies f/c/s, n/v/d, hemoptysis, PND, chest pain or edema. ? ? ? ? ? ? ? ?Allergies  ?Allergen Reactions  ? Asa [Aspirin] Other (See Comments)  ?  As a child, had nose bleed.   ? ? ?Immunization History  ?Administered Date(s) Administered  ? Influenza Split 01/10/2013  ? Influenza,inj,Quad PF,6+ Mos 10/25/2017  ? Pneumococcal Polysaccharide-23 10/25/2017  ? ? ?Past Medical History:  ?Diagnosis Date  ? Bipolar 1 disorder (HCC)   ? Depression   ? Hernia, abdominal   ? Seizures (HCC)   ? Stroke [facility])   ? ? ?Tobacco History: ?Social History  ? ?Tobacco Use  ?Smoking Status Every Day  ? Packs/day: 1.00  ? Years: 30.00  ? Pack years: 30.00  ? Types: Cigarettes  ?Smokeless Tobacco Former  ? ?Ready to quit: Not Answered ?Counseling given: Not Answered ? ? ?Outpatient Encounter Medications as of 04/02/2022  ?Medication Sig  ? busPIRone (BUSPAR) 10 MG tablet Take 1 tablet (10 mg total) by mouth 2 (two) times daily.  ?  [DISCONTINUED] furosemide (LASIX) 20 MG tablet Take 1 tablet (20 mg total) by mouth daily.  ? [DISCONTINUED] QUEtiapine (SEROQUEL) 200 MG tablet Take 1 tablet (200 mg total) by mouth at bedtime. For mood control  ? divalproex (DEPAKOTE) 500 MG DR tablet Take 1 tablet (500 mg total) by mouth every 12 (twelve) hours. For mood control (Patient not taking: Reported on 04/02/2022)  ? furosemide (LASIX) 20 MG tablet Take 1 tablet (20 mg total) by mouth daily.  ? gabapentin (NEURONTIN) 300 MG capsule Take 1 capsule (300 mg total) by mouth 3 (three) times daily. For withdrawals  ? QUEtiapine (SEROQUEL) 200 MG tablet Take 1 tablet (200 mg total) by mouth at bedtime. For mood control  ? [DISCONTINUED] gabapentin (NEURONTIN) 300 MG capsule Take 1 capsule (300 mg total) by mouth 3 (three) times daily. For withdrawals (Patient not taking: Reported on 04/02/2022)  ? [DISCONTINUED] gabapentin (NEURONTIN) 300 MG capsule Take 1 capsule (300 mg total) by mouth 3 (three) times daily. For withdrawals  ? ?No facility-administered encounter medications on file as of 04/02/2022.  ? ? ? ?Review of Systems ? ?Review of Systems  ?Constitutional: Negative.   ?HENT: Negative.    ?Cardiovascular: Negative.   ?Gastrointestinal:  Positive for abdominal distention.  ?Allergic/Immunologic: Negative.   ?Neurological: Negative.   ?Psychiatric/Behavioral:  Negative.     ? ? ? ?Physical Exam ? ?BP 118/76 (BP Location: Right Arm, Patient Position: Sitting, Cuff Size: Normal)   Pulse 82   Temp 98.4 ?F (36.9 ?C)   Ht 5\' 8"  (1.727 m)   Wt 181 lb 6 oz (82.3 kg)   SpO2 100%   BMI 27.58 kg/m?  ? ?Wt Readings from Last 5 Encounters:  ?04/02/22 181 lb 6 oz (82.3 kg)  ?02/24/22 160 lb 15 oz (73 kg)  ?02/23/22 160 lb 15 oz (73 kg)  ?05/28/21 183 lb (83 kg)  ?01/26/18 175 lb (79.4 kg)  ? ? ? ?Physical Exam ?Vitals and nursing note reviewed.  ?Constitutional:   ?   General: He is not in acute distress. ?   Appearance: He is well-developed.  ?Cardiovascular:  ?    Rate and Rhythm: Normal rate and regular rhythm.  ?Pulmonary:  ?   Effort: Pulmonary effort is normal.  ?   Breath sounds: Normal breath sounds.  ?Abdominal:  ?   General: There is distension.  ?Skin: ?   General: Skin is warm and dry.  ?Neurological:  ?   Mental Status: He is alert and oriented to person, place, and time.  ? ? ? ?Lab Results: ? ?CBC ?   ?Component Value Date/Time  ? WBC 8.8 03/20/2022 1015  ? RBC 3.43 (L) 03/20/2022 1015  ? HGB 12.6 (L) 03/20/2022 1015  ? HCT 34.0 (L) 03/20/2022 1015  ? PLT 114 (L) 03/20/2022 1015  ? MCV 99.1 03/20/2022 1015  ? MCH 36.7 (H) 03/20/2022 1015  ? MCHC 37.1 (H) 03/20/2022 1015  ? RDW 17.6 (H) 03/20/2022 1015  ? LYMPHSABS 2.3 02/24/2022 0935  ? MONOABS 0.9 02/24/2022 0935  ? EOSABS 0.4 02/24/2022 0935  ? BASOSABS 0.1 02/24/2022 0935  ? ? ?BMET ?   ?Component Value Date/Time  ? NA 137 03/20/2022 1015  ? K 3.9 03/20/2022 1015  ? CL 104 03/20/2022 1015  ? CO2 27 03/20/2022 1015  ? GLUCOSE 105 (H) 03/20/2022 1015  ? BUN 6 03/20/2022 1015  ? CREATININE 0.66 03/20/2022 1015  ? CALCIUM 8.6 (L) 03/20/2022 1015  ? GFRNONAA >60 03/20/2022 1015  ? GFRAA >60 01/26/2018 0617  ? ? ?BNP ?   ?Component Value Date/Time  ? BNP 254.4 (H) 02/21/2022 0835  ? ? ?ProBNP ?No results found for: PROBNP ? ?Imaging: ?CT ABDOMEN PELVIS W CONTRAST ? ?Result Date: 03/20/2022 ?CLINICAL DATA:  Peritonitis. EXAM: CT ABDOMEN AND PELVIS WITH CONTRAST TECHNIQUE: Multidetector CT imaging of the abdomen and pelvis was performed using the standard protocol following bolus administration of intravenous contrast. RADIATION DOSE REDUCTION: This exam was performed according to the departmental dose-optimization program which includes automated exposure control, adjustment of the mA and/or kV according to patient size and/or use of iterative reconstruction technique. CONTRAST:  100mL OMNIPAQUE IOHEXOL 300 MG/ML  SOLN COMPARISON:  MR abdomen 02/23/2022, CT abdomen 02/21/2022 FINDINGS: Lower chest: Small left pleural  effusion. Hepatobiliary: No focal liver abnormality is seen. Nodular contour of the liver consistent with cirrhosis. No gallstones, gallbladder wall thickening, or biliary dilatation. Pancreas: Unremarkable. No pancreatic ductal dilatation or surrounding inflammatory changes. Spleen: Prior splenectomy with residual splenic tissue/splenules remaining without focal abnormality. Adrenals/Urinary Tract: Adrenal glands are unremarkable. Kidneys are normal, without renal calculi, focal lesion, or hydronephrosis. Bladder is unremarkable. Stomach/Bowel: Stomach is within normal limits. No evidence of bowel wall thickening, distention, or inflammatory changes. Normal appendix. Diverticulosis without evidence of diverticulitis. Vascular/Lymphatic: Normal caliber abdominal aorta with  mild atherosclerosis. No lymphadenopathy. Reproductive: Prostate is unremarkable. Other: Fat containing ventral abdominal hernia. Large volume ascites. Musculoskeletal: No acute osseous abnormality. No aggressive osseous lesion. Degenerative disease with disc height loss at L1-2, L2-3, L3-4, L4-5 and L5-S1. Bilateral facet arthropathy of the lumbar spine. Mild osteoarthritis of bilateral SI joints, left greater than right. IMPRESSION: 1. No evidence of bowel perforation or bowel obstruction. 2. Cirrhosis with large volume ascites. If there is clinical concern regarding peritonitis, recommend a diagnostic paracentesis. 3. Small left pleural effusion. 4. Aortic Atherosclerosis (ICD10-I70.0). Electronically Signed   By: Elige Ko M.D.   On: 03/20/2022 12:07  ? ?US Paracentesis ? ?Result Date: 03/20/2022 ?INDICATION: Cirrhosis, refractory ascites EXAM: ULTRASOUND GUIDED RLQ PARACENTESIS MEDICATIONS: None. COMPLICATIONS: None immediate. PROCEDURE: Informed written consent was obtained from the patient after a discussion of the risks, benefits and alternatives to treatment. A timeout was performed prior to the initiation of the procedure. Initial  ultrasound scanning demonstrates a large amount of ascites within the right lower abdominal quadrant. The right lower abdomen was prepped and draped in the usual sterile fashion. 1% lidocaine was used for local a

## 2022-04-03 ENCOUNTER — Encounter: Payer: Self-pay | Admitting: Nurse Practitioner

## 2022-04-03 DIAGNOSIS — Z87898 Personal history of other specified conditions: Secondary | ICD-10-CM | POA: Insufficient documentation

## 2022-04-03 NOTE — Assessment & Plan Note (Signed)
-   Ambulatory referral to Neurology ? ?2. Chronic bilateral low back pain with bilateral sciatica ? ?- gabapentin (NEURONTIN) 300 MG capsule; Take 1 capsule (300 mg total) by mouth 3 (three) times daily. For withdrawals  Dispense: 90 capsule; Refill: 0 ? ?3. Anxiety and depression ? ?- busPIRone (BUSPAR) 10 MG tablet; Take 1 tablet (10 mg total) by mouth 2 (two) times daily.  Dispense: 60 tablet; Refill: 0 ? ?4. Anasarca / Ascites  ? ?- Ambulatory referral to Interventional Radiology ? ? ?5. Liver failure without hepatic coma, unspecified chronicity (HCC) ? ?- furosemide (LASIX) 20 MG tablet; Take 1 tablet (20 mg total) by mouth daily.  Dispense: 30 tablet; Refill: 0 ? ? ? ?Follow up: ? ?Follow up in 3 months or sooner if needed ?

## 2022-04-04 ENCOUNTER — Ambulatory Visit (INDEPENDENT_AMBULATORY_CARE_PROVIDER_SITE_OTHER): Payer: Self-pay | Admitting: Physician Assistant

## 2022-04-04 ENCOUNTER — Encounter: Payer: Self-pay | Admitting: Physician Assistant

## 2022-04-04 ENCOUNTER — Other Ambulatory Visit (INDEPENDENT_AMBULATORY_CARE_PROVIDER_SITE_OTHER): Payer: Self-pay

## 2022-04-04 VITALS — BP 120/68 | HR 71 | Ht 68.0 in | Wt 184.0 lb

## 2022-04-04 DIAGNOSIS — R609 Edema, unspecified: Secondary | ICD-10-CM

## 2022-04-04 DIAGNOSIS — K7031 Alcoholic cirrhosis of liver with ascites: Secondary | ICD-10-CM

## 2022-04-04 DIAGNOSIS — F1020 Alcohol dependence, uncomplicated: Secondary | ICD-10-CM

## 2022-04-04 DIAGNOSIS — R600 Localized edema: Secondary | ICD-10-CM

## 2022-04-04 LAB — BASIC METABOLIC PANEL
BUN: 6 mg/dL (ref 6–23)
CO2: 30 mEq/L (ref 19–32)
Calcium: 8.5 mg/dL (ref 8.4–10.5)
Chloride: 105 mEq/L (ref 96–112)
Creatinine, Ser: 0.68 mg/dL (ref 0.40–1.50)
GFR: 105.64 mL/min (ref >60.00)
Glucose, Bld: 83 mg/dL (ref 70–99)
Potassium: 3.8 mEq/L (ref 3.5–5.1)
Sodium: 140 mEq/L (ref 135–145)

## 2022-04-04 LAB — PROTIME-INR
INR: 1.4 ratio — ABNORMAL HIGH (ref 0.8–1.0)
Prothrombin Time: 15.1 s — ABNORMAL HIGH (ref 9.6–13.1)

## 2022-04-04 MED ORDER — FUROSEMIDE 40 MG PO TABS: 80.0000 mg | ORAL_TABLET | Freq: Every day | ORAL | 2 refills | Status: DC

## 2022-04-04 MED ORDER — SPIRONOLACTONE 50 MG PO TABS: 50.0000 mg | ORAL_TABLET | Freq: Every morning | ORAL | 3 refills | Status: DC

## 2022-04-04 NOTE — Progress Notes (Signed)
? ?Subjective:  ? ? Patient ID: Nathaniel Warren, male    DOB: 1966/06/27, 56 y.o.   MRN: [id number] ? ?HPI ?Nathaniel Warren is a 56 year old white male, new to GI today, referred by the emergency room and per patient his PCP Nathaniel Arms, NP for further management of decompensated EtOH induced cirrhosis. ?Patient had 2 brief admissions in March 2023 with ascites and left AMA both times.  He did undergo 1 paracentesis on 02/21/2022 and had 4.1 L removed at that time.  He then had a second paracentesis after ER visit on 03/20/2022 when he presented with increased abdominal swelling.  He had 4.9 L removed. ?He has been started on Lasix 20 mg p.o. every morning and says he has been taking this.  Patient has long history of EtOH abuse and said he had been drinking at least a pint of bourbon  per day for a long period of time, he says he has cut way back and is now having 1 drink per day. ?Other medical problems include history of prior lacunar infarcts, anxiety disorder, major depression, seizure disorder, bipolar disorder, prior history of drug overdose remote. ? ?He was evaluated by the portal hypertension clinic on 04/02/2022 M ELD calculated at 15, not felt to be a TIPS candidate, due to minimal use of diuretics, and at that time not under the care of a gastroenterologist.  ? 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 ?Patient also had an MRI in March 2023 which showed a large amount of ascites, cirrhotic appearing liver and no evidence for [facility] ? ?Most recent labs 03/20/2022 INR 1.4/EtOH level less than 10 ?Hemoglobin 12.6/hematocrit 34 MCV 99 platelets 114 ?Creatinine 0.66 ?Albumin 2.7 ?T. bili 4.0/alk phos 163/AST 79/ALT 21 ? ?Patient says that he generally feels okay, he can tell that the ascites fluid is building back up is currently not as tight as it was when he had  either of the prior paracenteses.  He has had lower extremity edema over the past 3 to 4 months as well, sometimes above the knees. ? ?Patient has not had prior EGD or colonoscopy. ? ?Review of Systems.Pertinent positive and negative review of systems were noted in the above HPI section.  All other review of systems was otherwise negative.  ? ?Outpatient Encounter Medications as of 04/04/2022  ?Medication Sig  ? busPIRone (BUSPAR) 10 MG tablet Take 1 tablet (10 mg total) by mouth 2 (two) times daily.  ? gabapentin (NEURONTIN) 300 MG capsule Take 1 capsule (300 mg total) by mouth 3 (three) times daily. For withdrawals  ? QUEtiapine (SEROQUEL) 200 MG tablet Take 1 tablet (200 mg total) by mouth at bedtime. For mood control  ? spironolactone (ALDACTONE) 50 MG tablet Take 1 tablet (50 mg total) by mouth in the morning.  ? [DISCONTINUED] furosemide (LASIX) 20 MG tablet Take 1 tablet (20 mg total) by mouth daily.  ? divalproex (DEPAKOTE) 500 MG DR tablet Take 1 tablet (500 mg total) by mouth every 12 (twelve) hours. For mood control (Patient not taking: Reported on 04/02/2022)  ? furosemide (LASIX) 40 MG tablet Take 2 tablets (80 mg total) by mouth daily.  ? ?No facility-administered encounter medications on file as of 04/04/2022.  ? ?Allergies  ?Allergen Reactions  ? Asa [Aspirin] Other (See Comments)  ?  As a child, had nose bleed.   ? ?Patient Active Problem  List  ? Diagnosis Date Noted  ? History of seizures 04/03/2022  ? Liver failure without hepatic coma ([place]) 03/05/2022  ? Alcoholic cirrhosis of liver with ascites ([place]) 02/24/2022  ? Anasarca 02/21/2022  ? Hypokalemia 02/21/2022  ? Hyponatremia 02/21/2022  ? Elevated LFTs 02/21/2022  ? Hyperbilirubinemia 02/21/2022  ? Elevated brain natriuretic peptide (BNP) level 02/21/2022  ? Tobacco abuse 02/21/2022  ? Alcohol abuse 02/21/2022  ? Thrombocytopenia ([place]) 02/21/2022  ? Normocytic anemia 02/21/2022  ? Multiple lacunar infarcts ([place]) 10/21/2017  ? Drug overdose,  intentional, initial encounter ([place]) 10/20/2017  ? Drug overdose, intentional self-harm, initial encounter ([place]) 10/20/2017  ? Seizures ([place]) 10/20/2017  ? Drug overdose 10/20/2017  ? Alcohol dependence ([place]) 06/19/2013  ? MDD (major depressive disorder), recurrent severe, without psychosis ([place]) 04/10/2013  ? GAD (generalized anxiety disorder) 04/10/2013  ? Depression 01/11/2013  ? Anxiety 01/11/2013  ? ?Social History  ? ?Socioeconomic History  ? Marital status: Single  ?  Spouse name: Not on file  ? Number of children: 2  ? Years of education: Not on file  ? Highest education level: Not on file  ?Occupational History  ? Occupation: Nature conservation officer  ?Tobacco Use  ? Smoking status: Every Day  ?  Packs/day: 1.00  ?  Years: 30.00  ?  Pack years: 30.00  ?  Types: Cigarettes  ? Smokeless tobacco: Former  ?Vaping Use  ? Vaping Use: Never used  ?Substance and Sexual Activity  ? Alcohol use: Yes  ?  Comment: 1/2 pint of vodka daily  ? Drug use: Yes  ?  Types: Marijuana  ? Sexual activity: Yes  ?  Comment:  wife had hysterectomy  ?Other Topics Concern  ? Not on file  ?Social History Narrative  ? ** Merged History Encounter **  ?    ? ?Social Determinants of Health  ? ?Financial Resource Strain: Not on file  ?Food Insecurity: Not on file  ?Transportation Needs: Not on file  ?Physical Activity: Not on file  ?Stress: Not on file  ?Social Connections: Not on file  ?Intimate Partner Violence: Not on file  ? ? ?[name]'s family history includes Alzheimer's disease in his maternal grandfather; COPD in his mother; Cancer in his paternal grandmother; Diabetes in his father; Hypertension in an other family member; Stomach cancer in his paternal grandfather; Stroke in his maternal grandmother. ? ? ?   ?Objective:  ?  ?Vitals:  ? 04/04/22 1120  ?BP: 120/68  ?Pulse: 71  ?SpO2: 96%  ? ? ?Physical Exam Well-developed well-nourished  WM  in no acute distress.  Height, HQIONG,295 BMI 27.9 ? ?HEENT; nontraumatic normocephalic, EOMI,  PE R LA, sclera anicteric. ?Oropharynx;not examined ?Neck; supple, no JVD ?Cardiovascular; regular rate and rhythm with S1-S2, no murmur rub or gallop ?Pulmonary; Clear bilaterally ?Abdomen; soft, nontense ascites, nontender no palpable mass or hepatosplenomegaly, bowel sounds are active ?Rectal; not done today ?Skin; benign exam, no jaundice rash or appreciable lesions ?Extremities; 2+ edema bilateral lower extremities to the knees ?Neuro/Psych; alert and oriented x4, grossly nonfocal mood and affect appropriate , no asterixis, mentating well ? ? ? ?   ?Assessment & Plan:  ? ?#12 56 year old white male with decompensated EtOH induced cirrhosis with anasarca/peripheral edema recurrent ascites. ?Patient has had 2 paracenteses over the past 6 weeks both with removal of 4 to 5 L. ?Continues to consume alcohol though states he has significantly reduced quantity now down to 1 drink per day ?M ELD =15 ? ?#2 coagulopathy and  thrombocytopenia secondary to above ?#3 [place] screening up-to-date with recent MRI March 2023-no hepatic lesion ?#4 history of anxiety disorder/major depression and bipolar disorder ?#5.  Seizure disorder ?#6.  History of previous lacunar infarcts ? ?#7 colon cancer screening-no prior colonoscopy ? ?Plan; patient strongly advised to discontinue all alcohol use altogether forever ?#2 start 2 g sodium diet.  We discussed importance of maintaining a very low sodium diet, he will be given a copy of a 2 g sodium diet. ?#3 increase Lasix from 20 mg p.o. every morning to 40 mg p.o. every morning ?#4 start spironolactone 50 mg p.o. every morning, can titrate if tolerates well. ?Check AFP, INR and be met today ?Plan  to repeat be met in 2 weeks ?Patient asked to weigh himself daily and record, same time each day. ?I will plan to see him back in 3 to 4 weeks, if he maintains compliance with follow-up etc. will plan to schedule for EGD with Dr. Rush Landmark at the time of next office visit. ?Holding on starting  beta-blocker today as we will be increasing diuretics. ?Patient to be established with Dr. Rush Landmark  ? ? ?Tannar Broker S Parris Cudworth PA-C ?04/04/2022 ? ? ?Cc: [name], NP ?  ?

## 2022-04-04 NOTE — Patient Instructions (Addendum)
If you are age 54 or younger, your body mass index should be between 19-25. Your Body mass index is 27.98 kg/m?Marland Kitchen If this is out of the aformentioned range listed, please consider follow up with your Primary Care Provider.  ?________________________________________________________ ? ?The [place] [facility] providers would like to encourage you to use [facility] to communicate with providers for non-urgent requests or questions.  Due to long hold times on the telephone, sending your provider a message by [facility] may be a faster and more efficient way to get a response.  Please allow 48 business hours for a response.  Please remember that this is for non-urgent requests.  ?_______________________________________________________ ? ?Your provider has requested that you go to the basement level for lab work before leaving today. Press "B" on the elevator. The lab is located at the first door on the left as you exit the elevator. You will need a follow up lab in 2 weeks (Apr 18, 2022). ? ?Increase your Furosemide to 40 mg 2 tablets every morning ?START Spironolactone 50 mg 1 tablet every morning ? ?Weigh yourself daily ? ?Follow a 2 gram sodium diet. ? ?Stop drinking alcohol altogether. ? ?You have been scheduled to follow up with Mike Gip, PA-C on May 01, 2022 at 11:00 am ? ?Thank you for entrusting me with your care and choosing [facility] ? ?Mike Gip, PA-C ? ?Two Gram Sodium Diet 2000 mg ? ?What is Sodium? Sodium is a mineral found naturally in many foods. The most significant source of sodium in the diet is table salt, which is about 40% sodium.  Processed, convenience, and preserved foods also contain a large amount of sodium.  The body needs only 500 mg of sodium daily to function,  A normal diet provides more than enough sodium even if you do not use salt. ? ?Why Limit Sodium? A build up of sodium in the body can cause thirst, increased blood pressure, shortness of breath, and water retention.  Decreasing  sodium in the diet can reduce edema and risk of heart attack or stroke associated with high blood pressure.  Keep in mind that there are many other factors involved in these health problems.  Heredity, obesity, lack of exercise, cigarette smoking, stress and what you eat all play a role. ? ?General Guidelines: ?Do not add salt at the table or in cooking.  One teaspoon of salt contains over 2 grams of sodium. ?Read food labels ?Avoid processed and convenience foods ?Ask your dietitian before eating any foods not dicussed in the menu planning guidelines ?Consult your physician if you wish to use a salt substitute or a sodium containing medication such as antacids.  Limit milk and milk products to 16 oz (2 cups) per day. ? ?Shopping Hints: ?READ LABELS!! "Dietetic" does not necessarily mean low sodium. ?Salt and other sodium ingredients are often added to foods during processing. ? ? ? ?Menu Planning Guidelines ?Food Group Choose More Often Avoid  ?Beverages (see also the milk group All fruit juices, low-sodium, salt-free vegetables juices, low-sodium carbonated beverages Regular vegetable or tomato juices, commercially softened water used for drinking or cooking  ?Breads and Cereals Enriched white, wheat, rye and pumpernickel bread, hard rolls and dinner rolls; muffins, cornbread and waffles; most dry cereals, cooked cereal without added salt; unsalted crackers and breadsticks; low sodium or homemade bread crumbs Bread, rolls and crackers with salted tops; quick breads; instant hot cereals; pancakes; commercial bread stuffing; self-rising flower and biscuit mixes; regular bread crumbs or cracker  crumbs  ?Desserts and Sweets Desserts and sweets mad with mild should be within allowance Instant pudding mixes and cake mixes  ?Fats Butter or margarine; vegetable oils; unsalted salad dressings, regular salad dressings limited to 1 Tbs; light, sour and heavy cream Regular salad dressings containing bacon fat, bacon bits, and  salt pork; snack dips made with instant soup mixes or processed cheese; salted nuts  ?Fruits Most fresh, frozen and canned fruits Fruits processed with salt or sodium-containing ingredient (some dried fruits are processed with sodium sulfites  ? ? ? ? ? ? ?Vegetables Fresh, frozen vegetables and low- sodium canned vegetables Regular canned vegetables, sauerkraut, pickled vegetables, and others prepared in brine; frozen vegetables in sauces; vegetables seasoned with ham, bacon or salt pork  ?Condiments, Sauces, Miscellaneous ? Salt substitute with physician's approval; pepper, herbs, spices; vinegar, lemon or lime juice; hot pepper sauce; garlic powder, onion powder, low sodium soy sauce (1 Tbs.); low sodium condiments (ketchup, chili sauce, mustard) in limited amounts (1 tsp.) fresh ground horseradish; unsalted tortilla chips, pretzels, potato chips, popcorn, salsa (1/4 cup) Any seasoning made with salt including garlic salt, celery salt, onion salt, and seasoned salt; sea salt, rock salt, kosher salt; meat tenderizers; monosodium glutamate; mustard, regular soy sauce, barbecue, sauce, chili sauce, teriyaki sauce, steak sauce, Worcestershire sauce, and most flavored vinegars; canned gravy and mixes; regular condiments; salted snack foods, olives, picles, relish, horseradish sauce, catsup  ? ?Food preparation: Try these seasonings ?Meats:    ?Pork Sage, onion Serve with applesauce  ?Chicken Poultry seasoning, thyme, parsley Serve with cranberry sauce  ?Lamb Curry powder, rosemary, garlic, thyme Serve with mint sauce or jelly  ?Veal Marjoram, basil Serve with current jelly, cranberry sauce  ?Beef Pepper, bay leaf Serve with dry mustard, unsalted chive butter  ?Fish [place] leaf, dill Serve with unsalted lemon butter, unsalted parsley butter  ?Vegetables:    ?Asparagus Lemon juice   ?Broccoli Lemon juice   ?Carrots Mustard dressing parsley, mint, nutmeg, glazed with unsalted butter and sugar   ?Green beans Marjoram, lemon  juice, nutmeg,dill seed   ?Tomatoes Basil, marjoram, onion   ?Spice /blend for "Salt Shaker" 4 tsp ground thyme ?1 tsp ground sage 3 tsp ground rosemary ?4 tsp ground marjoram  ? ?Test your knowledge ?A product that says "Salt Free" may still contain sodium. True or False ?Garlic Powder and Hot Pepper Sauce an be used as alternative seasonings.True or False ?Processed foods have more sodium than fresh foods.  True or False ?Canned Vegetables have less sodium than froze True or False ? ? ?WAYS TO DECREASE YOUR SODIUM INTAKE ?Avoid the use of added salt in cooking and at the table.  Table salt (and other prepared seasonings which contain salt) is probably one of the greatest sources of sodium in the diet.  Unsalted foods can gain flavor from the sweet, sour, and butter taste sensations of herbs and spices.  Instead of using salt for seasoning, try the following seasonings with the foods listed.  Remember: how you use them to enhance natural food flavors is limited only by your creativity... ?Allspice-Meat, fish, eggs, fruit, peas, red and yellow vegetables ?Almond Extract-Fruit baked goods ?Anise Seed-Sweet breads, fruit, carrots, beets, cottage cheese, cookies (tastes like licorice) ?Basil-Meat, fish, eggs, vegetables, rice, vegetables salads, soups, sauces ?Bay Leaf-Meat, fish, stews, poultry ?Burnet-Salad, vegetables (cucumber-like flavor) ?Caraway Seed-Bread, cookies, cottage cheese, meat, vegetables, cheese, rice ?Cardamon-Baked goods, fruit, soups ?Celery Powder or seed-Salads, salad dressings, sauces, meatloaf, soup, bread.Do not use  celery  salt ?Chervil-Meats, salads, fish, eggs, vegetables, cottage cheese (parsley-like flavor) ?Chili Power-Meatloaf, chicken cheese, corn, eggplant, egg dishes ?Chives-Salads cottage cheese, egg dishes, soups, vegetables, sauces ?Cilantro-Salsa, casseroles ?Cinnamon-Baked goods, fruit, pork, lamb, chicken, carrots ?Cloves-Fruit, baked goods, fish, pot roast, green beans, beets,  carrots ?Coriander-Pastry, cookies, meat, salads, cheese (lemon-orange flavor) ?Cumin-Meatloaf, fish,cheese, eggs, cabbage,fruit pie (caraway flavor) ?United StationersCurry Powder-Meat, fruit, eggs, fish, poultry, c

## 2022-04-05 NOTE — Progress Notes (Signed)
Attending Physician's Attestation  ? ?I have reviewed the chart.  ? ?I agree with the Advanced Practitioner's note, impression, and recommendations with any updates as below. ?Hopefully patient will be compliant and will benefit from increased diuretics.  Close monitoring of kidney function with increasing his diuretics.  Suspect he will likely need LVPs in the interim while diuretics are further titrated.  Agree with endoscopic evaluation/needs.  If able [facility] EGD/colonoscopy is reasonable when he shows ability to maintain compliance. ? ? ?Corliss Parish, MD ?[facility] ?Advanced Endoscopy ?Office # [phone] ? ?

## 2022-04-07 LAB — AFP TUMOR MARKER: AFP-Tumor Marker: 3.3 ng/mL (ref <6.1)

## 2022-04-22 ENCOUNTER — Other Ambulatory Visit (INDEPENDENT_AMBULATORY_CARE_PROVIDER_SITE_OTHER): Payer: Self-pay

## 2022-04-22 ENCOUNTER — Encounter: Payer: Self-pay | Admitting: Neurology

## 2022-04-22 ENCOUNTER — Ambulatory Visit (INDEPENDENT_AMBULATORY_CARE_PROVIDER_SITE_OTHER): Payer: Self-pay | Admitting: Neurology

## 2022-04-22 DIAGNOSIS — R609 Edema, unspecified: Secondary | ICD-10-CM

## 2022-04-22 DIAGNOSIS — Z0289 Encounter for other administrative examinations: Secondary | ICD-10-CM

## 2022-04-22 DIAGNOSIS — F1020 Alcohol dependence, uncomplicated: Secondary | ICD-10-CM

## 2022-04-22 DIAGNOSIS — K7031 Alcoholic cirrhosis of liver with ascites: Secondary | ICD-10-CM

## 2022-04-22 LAB — BASIC METABOLIC PANEL
BUN: 7 mg/dL (ref 6–23)
CO2: 27 mEq/L (ref 19–32)
Calcium: 8.8 mg/dL (ref 8.4–10.5)
Chloride: 100 mEq/L (ref 96–112)
Creatinine, Ser: 0.76 mg/dL (ref 0.40–1.50)
GFR: 102.12 mL/min (ref >60.00)
Glucose, Bld: 135 mg/dL — ABNORMAL HIGH (ref 70–99)
Potassium: 3.4 mEq/L — ABNORMAL LOW (ref 3.5–5.1)
Sodium: 135 mEq/L (ref 135–145)

## 2022-04-22 NOTE — Progress Notes (Deleted)
[facility]  PATIENT: Nathaniel Warren DOB: 1966-04-17  REQUESTING CLINICIAN: Ivonne Andrew, NP HISTORY FROM: *** REASON FOR VISIT: ***   HISTORICAL  CHIEF COMPLAINT:  No chief complaint on file.   HISTORY OF PRESENT ILLNESS:  ***  Handedness:   Onset:  Seizure Type:   Current frequency:   Any injuries from seizures:   Seizure risk factors:   Previous ASMs:   Currenty ASMs:   ASMs side effects:   Brain Images:   Previous EEGs:    OTHER MEDICAL CONDITIONS: ***  REVIEW OF SYSTEMS: Full 14 system review of systems performed and negative with exception of: ***  ALLERGIES: Allergies  Allergen Reactions   Asa [Aspirin] Other (See Comments)    As a child, had nose bleed.     HOME MEDICATIONS: Outpatient Medications Prior to Visit  Medication Sig Dispense Refill   busPIRone (BUSPAR) 10 MG tablet Take 1 tablet (10 mg total) by mouth 2 (two) times daily. 60 tablet 0   divalproex (DEPAKOTE) 500 MG DR tablet Take 1 tablet (500 mg total) by mouth every 12 (twelve) hours. For mood control (Patient not taking: Reported on 04/02/2022) 60 tablet 0   furosemide (LASIX) 40 MG tablet Take 2 tablets (80 mg total) by mouth daily. 60 tablet 2   gabapentin (NEURONTIN) 300 MG capsule Take 1 capsule (300 mg total) by mouth 3 (three) times daily. For withdrawals 90 capsule 0   QUEtiapine (SEROQUEL) 200 MG tablet Take 1 tablet (200 mg total) by mouth at bedtime. For mood control 30 tablet 0   spironolactone (ALDACTONE) 50 MG tablet Take 1 tablet (50 mg total) by mouth in the morning. 30 tablet 3   No facility-administered medications prior to visit.    PAST MEDICAL HISTORY: Past Medical History:  Diagnosis Date   Depression    Hernia, abdominal    Liver failure (HCC)    Seizures (HCC)    Stroke (HCC)     PAST SURGICAL HISTORY: Past Surgical History:  Procedure Laterality Date   ANKLE SURGERY     ANKLE SURGERY Right    SPLENECTOMY       FAMILY HISTORY: Family History  Problem Relation Age of Onset   COPD Mother    Diabetes Father    Stroke Maternal Grandmother    Alzheimer's disease Maternal Grandfather    Cancer Paternal Grandmother    Stomach cancer Paternal Grandfather    Hypertension Other    Colon cancer Neg Hx    Esophageal cancer Neg Hx    Colon polyps Neg Hx     SOCIAL HISTORY: Social History   Socioeconomic History   Marital status: Single    Spouse name: Not on file   Number of children: 2   Years of education: Not on file   Highest education level: Not on file  Occupational History   Occupation: Corporate investment banker  Tobacco Use   Smoking status: Every Day    Packs/day: 1.00    Years: 30.00    Pack years: 30.00    Types: Cigarettes   Smokeless tobacco: Former  Building services engineer Use: Never used  Substance and Sexual Activity   Alcohol use: Yes    Comment: 1/2 pint of vodka daily   Drug use: Yes    Types: Marijuana   Sexual activity: Yes    Comment:  wife had hysterectomy  Other Topics Concern   Not on file  Social History Narrative   ** Merged  History Encounter **       Social Determinants of Health   Financial Resource Strain: Not on file  Food Insecurity: Not on file  Transportation Needs: Not on file  Physical Activity: Not on file  Stress: Not on file  Social Connections: Not on file  Intimate Partner Violence: Not on file     PHYSICAL EXAM ***  GENERAL EXAM/CONSTITUTIONAL: Vitals: There were no vitals filed for this visit. There is no height or weight on file to calculate BMI. Wt Readings from Last 3 Encounters:  04/04/22 184 lb (83.5 kg)  04/02/22 181 lb 6 oz (82.3 kg)  02/24/22 160 lb 15 oz (73 kg)   Patient is in no distress; well developed, nourished and groomed; neck is supple  CARDIOVASCULAR: Examination of carotid arteries is normal; no carotid bruits Regular rate and rhythm, no murmurs Examination of peripheral vascular system by observation  and palpation is normal  EYES: Pupils round and reactive to light, Visual fields full to confrontation, Extraocular movements intacts,  No results found.  MUSCULOSKELETAL: Gait, strength, tone, movements noted in Neurologic exam below  NEUROLOGIC: MENTAL STATUS:      View : No data to display.         awake, alert, oriented to person, place and time recent and remote memory intact normal attention and concentration language fluent, comprehension intact, naming intact fund of knowledge appropriate  CRANIAL NERVE:  2nd - no papilledema or hemorrhages on fundoscopic exam 2nd, 3rd, 4th, 6th - pupils equal and reactive to light, visual fields full to confrontation, extraocular muscles intact, no nystagmus 5th - facial sensation symmetric 7th - facial strength symmetric 8th - hearing intact 9th - palate elevates symmetrically, uvula midline 11th - shoulder shrug symmetric 12th - tongue protrusion midline  MOTOR:  normal bulk and tone, full strength in the BUE, BLE  SENSORY:  normal and symmetric to light touch, pinprick, temperature, vibration  COORDINATION:  finger-nose-finger, fine finger movements normal  REFLEXES:  deep tendon reflexes present and symmetric  GAIT/STATION:  normal     DIAGNOSTIC DATA (LABS, IMAGING, TESTING) - I reviewed patient records, labs, notes, testing and imaging myself where available.  Lab Results  Component Value Date   WBC 8.8 03/20/2022   HGB 12.6 (L) 03/20/2022   HCT 34.0 (L) 03/20/2022   MCV 99.1 03/20/2022   PLT 114 (L) 03/20/2022      Component Value Date/Time   NA 135 04/22/2022 1008   K 3.4 (L) 04/22/2022 1008   CL 100 04/22/2022 1008   CO2 27 04/22/2022 1008   GLUCOSE 135 (H) 04/22/2022 1008   BUN 7 04/22/2022 1008   CREATININE 0.76 04/22/2022 1008   CALCIUM 8.8 04/22/2022 1008   PROT 7.0 03/20/2022 1015   ALBUMIN 2.7 (L) 03/20/2022 1015   AST 79 (H) 03/20/2022 1015   ALT 21 03/20/2022 1015   ALKPHOS 163 (H)  03/20/2022 1015   BILITOT 4.0 (H) 03/20/2022 1015   GFRNONAA >60 03/20/2022 1015   GFRAA >60 01/26/2018 0617   Lab Results  Component Value Date   CHOL 194 10/28/2017   HDL 47 10/28/2017   LDLCALC 115 (H) 10/28/2017   TRIG 159 (H) 10/28/2017   Lab Results  Component Value Date   HGBA1C 5.3 10/28/2017   Lab Results  Component Value Date   VITAMINB12 1,196 (H) 02/23/2022   Lab Results  Component Value Date   TSH 3.734 10/28/2017    ***  I personally reviewed brain Images and  previous EEG reports.   ASSESSMENT AND PLAN  56 y.o. year old male  with ***   No diagnosis found.  There are no Patient Instructions on file for this visit.   Per [facility] statutes, patients with seizures are not allowed to drive until they have been seizure-free for six months.  Other recommendations include using caution when using heavy equipment or power tools. Avoid working on ladders or at heights. Take showers instead of baths.  Do not swim alone.  Ensure the water temperature is not too high on the home water heater. Do not go swimming alone. Do not lock yourself in a room alone (i.e. bathroom). When caring for infants or small children, sit down when holding, feeding, or changing them to minimize risk of injury to the child in the event you have a seizure. Maintain good sleep hygiene. Avoid alcohol.  Also recommend adequate sleep, hydration, good diet and minimize stress.   During the Seizure  - First, ensure adequate ventilation and place patients on the floor on their left side  Loosen clothing around the neck and ensure the airway is patent. If the patient is clenching the teeth, do not force the mouth open with any object as this can cause severe damage - Remove all items from the surrounding that can be hazardous. The patient may be oblivious to what's happening and may not even know what he or she is doing. If the patient is confused and wandering, either gently guide him/her  away and block access to outside areas - Reassure the individual and be comforting - Call 911. In most cases, the seizure ends before EMS arrives. However, there are cases when seizures may last over 3 to 5 minutes. Or the individual may have developed breathing difficulties or severe injuries. If a pregnant patient or a person with diabetes develops a seizure, it is prudent to call an ambulance. - Finally, if the patient does not regain full consciousness, then call EMS. Most patients will remain confused for about 45 to 90 minutes after a seizure, so you must use judgment in calling for help. - Avoid restraints but make sure the patient is in a bed with padded side rails - Place the individual in a lateral position with the neck slightly flexed; this will help the saliva drain from the mouth and prevent the tongue from falling backward - Remove all nearby furniture and other hazards from the area - Provide verbal assurance as the individual is regaining consciousness - Provide the patient with privacy if possible - Call for help and start treatment as ordered by the caregiver   After the Seizure (Postictal Stage)  After a seizure, most patients experience confusion, fatigue, muscle pain and/or a headache. Thus, one should permit the individual to sleep. For the next few days, reassurance is essential. Being calm and helping reorient the person is also of importance.  Most seizures are painless and end spontaneously. Seizures are not harmful to others but can lead to complications such as stress on the lungs, brain and the heart. Individuals with prior lung problems may develop labored breathing and respiratory distress.     No orders of the defined types were placed in this encounter.   No orders of the defined types were placed in this encounter.   No follow-ups on file.    Windell Norfolk, MD 04/22/2022, 2:21 PM  [facility] [address] [place], [place]  [address] [phone]

## 2022-04-28 ENCOUNTER — Other Ambulatory Visit: Payer: Self-pay

## 2022-04-28 MED ORDER — POTASSIUM CHLORIDE CRYS ER 20 MEQ PO TBCR: 20.0000 meq | EXTENDED_RELEASE_TABLET | Freq: Two times a day (BID) | ORAL | 0 refills | Status: DC

## 2022-05-01 ENCOUNTER — Ambulatory Visit (INDEPENDENT_AMBULATORY_CARE_PROVIDER_SITE_OTHER): Payer: Self-pay | Admitting: Physician Assistant

## 2022-05-01 ENCOUNTER — Encounter: Payer: Self-pay | Admitting: Physician Assistant

## 2022-05-01 ENCOUNTER — Other Ambulatory Visit (INDEPENDENT_AMBULATORY_CARE_PROVIDER_SITE_OTHER): Payer: Self-pay

## 2022-05-01 VITALS — BP 118/66 | HR 72 | Ht 69.0 in | Wt 173.8 lb

## 2022-05-01 DIAGNOSIS — K7460 Unspecified cirrhosis of liver: Secondary | ICD-10-CM

## 2022-05-01 DIAGNOSIS — K7031 Alcoholic cirrhosis of liver with ascites: Secondary | ICD-10-CM

## 2022-05-01 DIAGNOSIS — K7290 Hepatic failure, unspecified without coma: Secondary | ICD-10-CM

## 2022-05-01 LAB — BASIC METABOLIC PANEL
BUN: 7 mg/dL (ref 6–23)
CO2: 30 mEq/L (ref 19–32)
Calcium: 8.9 mg/dL (ref 8.4–10.5)
Chloride: 100 mEq/L (ref 96–112)
Creatinine, Ser: 0.75 mg/dL (ref 0.40–1.50)
GFR: 102.51 mL/min (ref >60.00)
Glucose, Bld: 93 mg/dL (ref 70–99)
Potassium: 3.8 mEq/L (ref 3.5–5.1)
Sodium: 137 mEq/L (ref 135–145)

## 2022-05-01 LAB — CBC WITH DIFFERENTIAL/PLATELET
Basophils Relative: 0 % (ref 0.0–3.0)
Eosinophils Relative: 4 % (ref 0.0–5.0)
HCT: 36.3 % — ABNORMAL LOW (ref 39.0–52.0)
Hemoglobin: 12.7 g/dL — ABNORMAL LOW (ref 13.0–17.0)
Lymphocytes Relative: 22 % (ref 12.0–46.0)
MCHC: 35.1 g/dL (ref 30.0–36.0)
MCV: 103.8 fl — ABNORMAL HIGH (ref 78.0–100.0)
Monocytes Relative: 15 % — ABNORMAL HIGH (ref 3.0–12.0)
Neutrophils Relative %: 59 % (ref 43.0–77.0)
Platelets: 93 10*3/uL — ABNORMAL LOW (ref 150.0–400.0)
RBC: 3.49 Mil/uL — ABNORMAL LOW (ref 4.22–5.81)
RDW: 15.5 % (ref 11.5–15.5)
WBC: 9.5 10*3/uL (ref 4.0–10.5)

## 2022-05-01 NOTE — Progress Notes (Signed)
Attending Physician's Attestation   I have reviewed the chart.   I agree with the Advanced Practitioner's note, impression, and recommendations with any updates as below.    Alrick Cubbage Mansouraty, MD [facility] Advanced Endoscopy Office # [phone]  

## 2022-05-01 NOTE — Progress Notes (Signed)
Subjective:    Patient ID: Nathaniel Warren, male    DOB: February 18, 1966, 56 y.o.   MRN: [id number]  HPI Nathaniel Warren is a pleasant 56 year old white male, established with Dr. Rush Landmark,, and also known to myself.  He has decompensated EtOH induced cirrhosis with ascites.  He was last seen in the office on 04/04/2022 by myself and comes in today for follow-up.  At the time of last office visit we increased his diuretic.  He is currently taking Lasix 40 mg p.o. every morning and Aldactone 50 mg p.o. every morning.  He feels that he has piquantly less fluid on board, and says he is urinating very frequently.  His weight is down 9 pounds since last office visit, currently 173. Prior to being started on diuretics he had 2 paracenteses over a 6-week period and has not required more recent paracentesis. He had been drinking heavily, is now weaned down to 1 drink at night prior to bedtime and says he is aware that is still a problem and is working on completely stopping. Otherwise feeling pretty good today with no specific complaints. He did have an MRI in March 2023 with no evidence of HCC. Most recent labs on 04/22/2022 potassium 3.4, this was supplemented with a short course of K , BUN 7/creatinine 0.76 He Is following a 2 g sodium diet  Patient has not had EGD to date for variceal surveillance, and has not had prior colonoscopy.  He does have family history of polyps in his father.  Review of Systems  Pertinent positive and negative review of systems were noted in the above HPI section.  All other review of systems was otherwise negative.   Outpatient Encounter Medications as of 05/01/2022  Medication Sig   busPIRone (BUSPAR) 10 MG tablet Take 1 tablet (10 mg total) by mouth 2 (two) times daily.   furosemide (LASIX) 40 MG tablet Take 2 tablets (80 mg total) by mouth daily. (Patient taking differently: Take 40 mg by mouth daily.)   gabapentin (NEURONTIN) 300 MG capsule Take 1 capsule (300 mg total) by mouth 3  (three) times daily. For withdrawals   QUEtiapine (SEROQUEL) 200 MG tablet Take 1 tablet (200 mg total) by mouth at bedtime. For mood control   spironolactone (ALDACTONE) 50 MG tablet Take 1 tablet (50 mg total) by mouth in the morning.   potassium chloride SA (KLOR-CON M) 20 MEQ tablet Take 1 tablet (20 mEq total) by mouth 2 (two) times daily for 2 days.   [DISCONTINUED] divalproex (DEPAKOTE) 500 MG DR tablet Take 1 tablet (500 mg total) by mouth every 12 (twelve) hours. For mood control (Patient not taking: Reported on 04/02/2022)   No facility-administered encounter medications on file as of 05/01/2022.   Allergies  Allergen Reactions   Asa [Aspirin] Other (See Comments)    As a child, had nose bleed.    Patient Active Problem List   Diagnosis Date Noted   History of seizures 04/03/2022   Liver failure without hepatic coma ([place]) 13/24/4010   Alcoholic cirrhosis of liver with ascites ([place]) 02/24/2022   Anasarca 02/21/2022   Hypokalemia 02/21/2022   Hyponatremia 02/21/2022   Elevated LFTs 02/21/2022   Hyperbilirubinemia 02/21/2022   Elevated brain natriuretic peptide (BNP) level 02/21/2022   Tobacco abuse 02/21/2022   Alcohol abuse 02/21/2022   Thrombocytopenia ([place]) 02/21/2022   Normocytic anemia 02/21/2022   Multiple lacunar infarcts ([place]) 10/21/2017   Drug overdose, intentional, initial encounter ([place]) 10/20/2017   Drug overdose, intentional self-harm,  initial encounter ([place]) 10/20/2017   Seizures ([place]) 10/20/2017   Drug overdose 10/20/2017   Alcohol dependence ([place]) 06/19/2013   MDD (major depressive disorder), recurrent severe, without psychosis ([place]) 04/10/2013   GAD (generalized anxiety disorder) 04/10/2013   Depression 01/11/2013   Anxiety 01/11/2013   Social History   Socioeconomic History   Marital status: Single    Spouse name: Not on file   Number of children: 2   Years of education: Not on file   Highest education level: Not on file  Occupational History    Occupation: Nature conservation officer  Tobacco Use   Smoking status: Every Day    Packs/day: 1.00    Years: 30.00    Pack years: 30.00    Types: Cigarettes   Smokeless tobacco: Former  Scientific laboratory technician Use: Never used  Substance and Sexual Activity   Alcohol use: Yes    Comment: 1/2 pint of vodka daily   Drug use: Yes    Types: Marijuana   Sexual activity: Yes    Comment:  wife had hysterectomy  Other Topics Concern   Not on file  Social History Narrative   ** Merged History Encounter **       Social Determinants of Health   Financial Resource Strain: Not on file  Food Insecurity: Not on file  Transportation Needs: Not on file  Physical Activity: Not on file  Stress: Not on file  Social Connections: Not on file  Intimate Partner Violence: Not on file    Nathaniel Warren's family history includes Alzheimer's disease in his maternal grandfather; COPD in his mother; Cancer in his paternal grandmother; Diabetes in his father; Hypertension in an other family member; Stomach cancer in his paternal grandfather; Stroke in his maternal grandmother.      Objective:    Vitals:   05/01/22 1100  BP: 118/66  Pulse: 72  SpO2: 97%    Physical Exam Well-developed well-nourished white male in no acute distress.  Height, Weight, 173 BMI 25.6  HEENT; nontraumatic normocephalic, EOMI, PE R LA, sclera anicteric. Oropharynx; not examined today Neck; supple, no JVD Cardiovascular; regular rate and rhythm with S1-S2, no murmur rub or gallop Pulmonary; Clear bilaterally Abdomen; soft, protuberant but nontense ascites no palpable mass , liver edge palpable in the right upper quadrant 2 to 3 fingerbreadths below the right costal margin bowel sounds are active Rectal; not done today Skin; benign exam, no jaundice rash or appreciable lesions Extremities; no clubbing cyanosis 1+ edema bilateral lower extremities to the shin Neuro/Psych; alert and oriented x4, grossly nonfocal mood and affect  appropriate no asterixis       Assessment & Plan:   #64 56 year old white male with decompensated alcoholic cirrhosis with ascites. Meld as of 04/02/22 =15  He has not required further paracentesis, With initiation of diuretics, weight down 9 pounds since last office visit  He continues to drink 1 alcoholic beverage per day-trying to stop Has been compliant with meds and 2 g sodium diet No prior EGD  #2 coagulopathy and thrombocytopenia secondary to above #3.  [place] screening up-to-date with negative MRI March 2023 and normal AFP #4 seizure disorder #5.  History of prior lacunar infarcts #6.  History of anxiety/depression #7 colon cancer screening-no prior colonoscopy  Plan; patient will be scheduled for EGD and colonoscopy with Dr. Lia Foyer Both procedures were discussed in detail with the patient including indications risks and benefits and he is agreeable to proceed.  We also discussed potential variceal bleeding,  and manifestations. We will continue current doses of diuretics with spironolactone 50 mg p.o. every morning and Lasix 40 mg p.o. every morning Check CBC with differential today, be met Plan repeat be met in 2-week We discussed importance of complete abstinence from EtOH, he will continue to try to wean completely off. Has not been started on nonselective beta-blocker as yet, this can be started post EGD Office follow-up with Dr. Rush Landmark or myself in 4 to 6 weeks.        Mena Lienau S Genisis Sonnier PA-C 05/01/2022   Cc: No ref. provider found

## 2022-05-01 NOTE — Patient Instructions (Signed)
If you are age 54 or younger, your body mass index should be between 19-25. Your Body mass index is 25.67 kg/m. If this is out of the aformentioned range listed, please consider follow up with your Primary Care Provider.  ________________________________________________________  The [facility] providers would like to encourage you to use [facility] to communicate with providers for non-urgent requests or questions.  Due to long hold times on the telephone, sending your provider a message by [facility] may be a faster and more efficient way to get a response.  Please allow 48 business hours for a response.  Please remember that this is for non-urgent requests.  _______________________________________________________  Bonita Quin have been scheduled for an endoscopy and colonoscopy. Please follow the written instructions given to you at your visit today. Please pick up your prep supplies at the pharmacy within the next 1-3 days. If you use inhalers (even only as needed), please bring them with you on the day of your procedure.  Your provider has requested that you go to the basement level for lab work before leaving today. Press "B" on the elevator. The lab is located at the first door on the left as you exit the elevator.  Continue Spironolactone and Furosemide   Continue to follow a 2 gram sodium diet/low salt diet.  Continue to wean off all alcohol.  Follow up pending the results of your Colonoscopy/Endoscopy.  Thank you for entrusting me with your care and choosing [facility].  Amy Esterwood, PA-C

## 2022-05-18 ENCOUNTER — Encounter: Payer: Self-pay | Admitting: Certified Registered Nurse Anesthetist

## 2022-05-22 ENCOUNTER — Telehealth: Payer: Self-pay | Admitting: Gastroenterology

## 2022-05-22 ENCOUNTER — Encounter: Payer: Self-pay | Admitting: Gastroenterology

## 2022-05-22 NOTE — Telephone Encounter (Signed)
Thank you for update. Please place recall in system for 4 months, if he does not call before then. If the patient reschedules and misses another appointment, he will need to be assessed the late cancellation fee. GM

## 2022-05-22 NOTE — Telephone Encounter (Signed)
Called patient--- NO answer-- Left message on machine to have patient call me back at my direct number to let us know if he is coming in for his procedure or if he needs to R/S.

## 2022-06-05 ENCOUNTER — Other Ambulatory Visit: Payer: Self-pay | Admitting: Nurse Practitioner

## 2022-06-05 ENCOUNTER — Telehealth: Payer: Self-pay | Admitting: Nurse Practitioner

## 2022-06-05 DIAGNOSIS — G8929 Other chronic pain: Secondary | ICD-10-CM

## 2022-06-05 MED ORDER — GABAPENTIN 300 MG PO CAPS: 300.0000 mg | ORAL_CAPSULE | Freq: Three times a day (TID) | ORAL | 0 refills | Status: DC

## 2022-06-05 NOTE — Telephone Encounter (Signed)
Patient LVM requesting refill on Gabapentin. Next appt is 7/26. He is out of meds. [facility]: [facility] [address]

## 2022-06-17 ENCOUNTER — Encounter: Payer: Self-pay | Admitting: Neurology

## 2022-06-17 ENCOUNTER — Telehealth: Payer: Self-pay | Admitting: Physician Assistant

## 2022-06-17 NOTE — Telephone Encounter (Signed)
Patient called and stated he needed to have a paracentesis set up.  Please call patient and advise.  Thank you.

## 2022-06-17 NOTE — Telephone Encounter (Signed)
Inbound call from patient requesting a call back to schedule a paracentesis. Please advise.

## 2022-06-17 NOTE — Telephone Encounter (Signed)
56 year patient with cirrhosis and ascites. He was last seen by Mike Gip, PA-C 05/01/22. Labs last done 05/01/22. Late cancellation of his procedures on 05/22/22.   Spoke with the patient. He reports compliance with his diet and medications. Despite this he has had a gradual increase of the fluid in the abdomen. He has increased abdominal pressure making him feel "9 months pregnant." Denies shortness of breath. Endorse new onset of LE edema. Unable to get comfortable and has interrupted sleep due to this. Patient does not feel he needs to go to the ER at this point and requests to be scheduled for a paracentesis. (He also is ready to reschedule his EGD/colon which I told him will be addressed as well.) Please advise

## 2022-06-17 NOTE — Progress Notes (Signed)
Patient no showed

## 2022-06-17 NOTE — Telephone Encounter (Signed)
Pt called asking to speak to Tonya to schedule a pericentesis so he does not have to go to the emergency room   Please call back at [phone]

## 2022-06-18 ENCOUNTER — Other Ambulatory Visit: Payer: Self-pay

## 2022-06-18 ENCOUNTER — Other Ambulatory Visit (INDEPENDENT_AMBULATORY_CARE_PROVIDER_SITE_OTHER): Payer: Self-pay

## 2022-06-18 DIAGNOSIS — K7290 Hepatic failure, unspecified without coma: Secondary | ICD-10-CM

## 2022-06-18 DIAGNOSIS — K7460 Unspecified cirrhosis of liver: Secondary | ICD-10-CM

## 2022-06-18 LAB — PROTIME-INR
INR: 1.4 ratio — ABNORMAL HIGH (ref 0.8–1.0)
Prothrombin Time: 14.8 s — ABNORMAL HIGH (ref 9.6–13.1)

## 2022-06-18 LAB — CBC WITH DIFFERENTIAL/PLATELET
Basophils Relative: 0 % (ref 0.0–3.0)
Eosinophils Relative: 3 % (ref 0.0–5.0)
HCT: 34.9 % — ABNORMAL LOW (ref 39.0–52.0)
Hemoglobin: 12.2 g/dL — ABNORMAL LOW (ref 13.0–17.0)
Lymphocytes Relative: 31 % (ref 12.0–46.0)
MCHC: 34.9 g/dL (ref 30.0–36.0)
MCV: 104.8 fl — ABNORMAL HIGH (ref 78.0–100.0)
Monocytes Relative: 10 % (ref 3.0–12.0)
Neutrophils Relative %: 56 % (ref 43.0–77.0)
Platelets: 71 10*3/uL — ABNORMAL LOW (ref 150.0–400.0)
RBC: 3.33 Mil/uL — ABNORMAL LOW (ref 4.22–5.81)
RDW: 16.6 % — ABNORMAL HIGH (ref 11.5–15.5)
WBC: 12.8 10*3/uL — ABNORMAL HIGH (ref 4.0–10.5)

## 2022-06-18 LAB — COMPREHENSIVE METABOLIC PANEL
ALT: 26 U/L (ref 0–53)
AST: 99 U/L — ABNORMAL HIGH (ref 0–37)
Albumin: 3.3 g/dL — ABNORMAL LOW (ref 3.5–5.2)
Alkaline Phosphatase: 283 U/L — ABNORMAL HIGH (ref 39–117)
BUN: 12 mg/dL (ref 6–23)
CO2: 27 mEq/L (ref 19–32)
Calcium: 8.8 mg/dL (ref 8.4–10.5)
Chloride: 100 mEq/L (ref 96–112)
Creatinine, Ser: 0.84 mg/dL (ref 0.40–1.50)
GFR: 98.97 mL/min (ref >60.00)
Glucose, Bld: 87 mg/dL (ref 70–99)
Potassium: 4.1 mEq/L (ref 3.5–5.1)
Sodium: 134 mEq/L — ABNORMAL LOW (ref 135–145)
Total Bilirubin: 4.1 mg/dL — ABNORMAL HIGH (ref 0.2–1.2)
Total Protein: 8.1 g/dL (ref 6.0–8.3)

## 2022-06-18 LAB — PHOSPHORUS: Phosphorus: 3.5 mg/dL (ref 2.3–4.6)

## 2022-06-18 LAB — MAGNESIUM: Magnesium: 1.6 mg/dL (ref 1.5–2.5)

## 2022-06-18 NOTE — Telephone Encounter (Signed)
Patient here for labs. Orders for paracentesis placed and scheduling notified.

## 2022-06-18 NOTE — Telephone Encounter (Signed)
Patient notified to go to [facility] Admission tomorrow arriving at 2:30 pm. Patient agrees to this plan.

## 2022-06-18 NOTE — Telephone Encounter (Signed)
Beth, Okay for repeat paracentesis.  Patient should have fluid cell count and fluid culture performed.  Patient can have a maximum of 5 L removed and should have 25 g of albumin given. Patient needs updated labs with the new onset lower extremity edema so I would do a CBC/CMP/mag/Phos/INR. We will have to discuss a potential TTE but lets see where things stand right now. Would not make any adjustment in diuretics until we have his electrolytes back. Set him up for a clinic visit with PA Esterwood or myself in the next few weeks so that we can ensure he is stable for EGD/colon rescheduling. Thanks. GM

## 2022-06-18 NOTE — Progress Notes (Signed)
Spoke with the patient about the plan using teach back method. Confirmed the orders for cell count and culture are attached to the paracentesis. Order for albumin also listed. Patient will hold his diuretics the day of the paracentesis.

## 2022-06-19 ENCOUNTER — Ambulatory Visit (HOSPITAL_COMMUNITY)
Admission: RE | Admit: 2022-06-19 | Discharge: 2022-06-19 | Disposition: A | Discharge status: Home / Self Care | Payer: Self-pay | Source: Ambulatory Visit | Attending: Gastroenterology | Admitting: Gastroenterology

## 2022-06-19 DIAGNOSIS — K7460 Unspecified cirrhosis of liver: Secondary | ICD-10-CM | POA: Insufficient documentation

## 2022-06-19 DIAGNOSIS — K7290 Hepatic failure, unspecified without coma: Secondary | ICD-10-CM | POA: Insufficient documentation

## 2022-06-19 LAB — BODY FLUID CELL COUNT WITH DIFFERENTIAL
Eos, Fluid: 0 %
Lymphs, Fluid: 29 %
Monocyte-Macrophage-Serous Fluid: 69 % (ref 50–90)
Neutrophil Count, Fluid: 2 % (ref 0–25)
Total Nucleated Cell Count, Fluid: 212 cu mm (ref 0–1000)

## 2022-06-19 LAB — GRAM STAIN

## 2022-06-19 MED ORDER — LIDOCAINE HCL 1 % IJ SOLN
INTRAMUSCULAR | Status: AC
  Administered 2022-06-19: 10 mL
  Filled 2022-06-19: qty 20

## 2022-06-19 NOTE — Procedures (Signed)
PROCEDURE SUMMARY:  Successful image-guided paracentesis from the right lower abdomen.  Yielded 550 mL of thin, yellow fluid.  No immediate complications.  EBL = trace. Patient tolerated well.   Specimen was sent for labs.  Please see imaging section of Epic for full dictation.   Kennieth Francois PA-C 06/19/2022 3:28 PM

## 2022-06-20 ENCOUNTER — Telehealth: Payer: Self-pay

## 2022-06-20 NOTE — Telephone Encounter (Signed)
Beth, Thank you for this update. Please reach out to patient next week and update Amy and myself and we can decide upon role of diuretic titration based on his weight next week. Thanks. GM

## 2022-06-20 NOTE — Telephone Encounter (Signed)
06/19/22 Paracentesis performed. No albumin was given. (It was ordered) A total of approximately 550 mL of thin yellow fluid was removed. Samples were sent to the laboratory as requested by the clinical Team. Patient reports weight before paracentesis 182 lbs 06/19/22. Patient reports weight today 178 lbs 06/20/22. Labs done 06/18/22 Confirmed medications -lasix 40 mg-aldactone 50 mg  daily Follow up appointment is 07/31/22. Thanks you

## 2022-06-23 LAB — PATHOLOGIST SMEAR REVIEW

## 2022-06-24 ENCOUNTER — Other Ambulatory Visit: Payer: Self-pay | Admitting: Physician Assistant

## 2022-06-24 LAB — CULTURE, BODY FLUID W GRAM STAIN -BOTTLE: Culture: NO GROWTH

## 2022-06-25 NOTE — Telephone Encounter (Signed)
Please check to see if this was address as I was not in the office.

## 2022-06-26 ENCOUNTER — Other Ambulatory Visit: Payer: Self-pay | Admitting: Nurse Practitioner

## 2022-06-26 DIAGNOSIS — G8929 Other chronic pain: Secondary | ICD-10-CM

## 2022-06-27 ENCOUNTER — Other Ambulatory Visit: Payer: Self-pay

## 2022-06-27 MED ORDER — FUROSEMIDE 40 MG PO TABS: 40.0000 mg | ORAL_TABLET | Freq: Every day | ORAL | 0 refills | Status: DC

## 2022-06-27 NOTE — Telephone Encounter (Signed)
Spoke with the patient. He did not weigh himself this morning. He has eaten and taken him medication. He reports feeling well. Does not feel an accumulation of abdominal fluid. No LE edema noted. We will talk again in 3 days (Monday) and he will give me his current weight.  Confirmed medications Lasix 40 mg daily and Aldactone 50 mg daily. Medication list correct.

## 2022-06-30 NOTE — Telephone Encounter (Signed)
Called the patient to check weight today. No answer. Left a voicemail of my call.

## 2022-07-01 ENCOUNTER — Telehealth: Payer: Self-pay

## 2022-07-01 DIAGNOSIS — G8929 Other chronic pain: Secondary | ICD-10-CM

## 2022-07-01 MED ORDER — GABAPENTIN 300 MG PO CAPS: 300.0000 mg | ORAL_CAPSULE | Freq: Three times a day (TID) | ORAL | 0 refills | Status: DC

## 2022-07-01 NOTE — Telephone Encounter (Signed)
No additional notes needed  

## 2022-07-02 ENCOUNTER — Ambulatory Visit (INDEPENDENT_AMBULATORY_CARE_PROVIDER_SITE_OTHER): Payer: Self-pay | Admitting: Nurse Practitioner

## 2022-07-02 ENCOUNTER — Encounter: Payer: Self-pay | Admitting: Nurse Practitioner

## 2022-07-02 VITALS — BP 117/72 | HR 62 | Temp 97.6°F | Ht 68.0 in | Wt 177.6 lb

## 2022-07-02 DIAGNOSIS — R601 Generalized edema: Secondary | ICD-10-CM

## 2022-07-02 DIAGNOSIS — K7290 Hepatic failure, unspecified without coma: Secondary | ICD-10-CM

## 2022-07-02 NOTE — Progress Notes (Signed)
@Patient  ID: , male    DOB: October 16, 1966, 56 y.o.   MRN: [id number]  Chief Complaint  Patient presents with   Follow-up    Pt is here for 3 month's follow up visit. Pt states for the past 2 week's he is unable to sleep at night.    Referring provider: No ref. provider found   HPI  56 year old male with history of stroke, liver failure, alcoholic cirrhosis (followed by GI).   Patient has been doing well - has had paracentesis once since last visit. Has been well controlled with diuretics through GI. States that he will be having colonoscopy and endoscopy. Did have recent labs with GI. No new issues or concerns today. Denies f/c/s, n/v/d, hemoptysis, PND, leg swelling Denies chest pain or edema     Allergies  Allergen Reactions   Asa [Aspirin] Other (See Comments)    As a child, had nose bleed.     Immunization History  Administered Date(s) Administered   Influenza Split 01/10/2013   Influenza,inj,Quad PF,6+ Mos 10/25/2017   Pneumococcal Polysaccharide-23 10/25/2017    Past Medical History:  Diagnosis Date   Depression    Hernia, abdominal    Liver failure (HCC)    Seizures (HCC)    Stroke (HCC)     Tobacco History: Social History   Tobacco Use  Smoking Status Every Day   Packs/day: 1.00   Years: 30.00   Total pack years: 30.00   Types: Cigarettes  Smokeless Tobacco Former   Ready to quit: Not Answered Counseling given: Not Answered   Outpatient Encounter Medications as of 07/02/2022  Medication Sig   furosemide (LASIX) 40 MG tablet Take 1 tablet (40 mg total) by mouth daily.   gabapentin (NEURONTIN) 300 MG capsule Take 1 capsule (300 mg total) by mouth 3 (three) times daily. For withdrawals   QUEtiapine (SEROQUEL) 200 MG tablet Take 1 tablet (200 mg total) by mouth at bedtime. For mood control   spironolactone (ALDACTONE) 50 MG tablet Take 1 tablet (50 mg total) by mouth in the morning.   potassium chloride SA (KLOR-CON M) 20 MEQ tablet Take 1  tablet (20 mEq total) by mouth 2 (two) times daily for 2 days.   No facility-administered encounter medications on file as of 07/02/2022.     Review of Systems  Review of Systems  Constitutional: Negative.   HENT: Negative.    Cardiovascular: Negative.   Gastrointestinal: Negative.   Allergic/Immunologic: Negative.   Neurological: Negative.   Psychiatric/Behavioral: Negative.         Physical Exam  BP 117/72 (BP Location: Right Arm, Patient Position: Sitting, Cuff Size: Normal)   Pulse 62   Temp 97.6 F (36.4 C)   Ht 5\' 8"  (1.727 m)   Wt 177 lb 9.6 oz (80.6 kg)   SpO2 98%   BMI 27.00 kg/m   Wt Readings from Last 5 Encounters:  07/02/22 177 lb 9.6 oz (80.6 kg)  05/01/22 173 lb 12.8 oz (78.8 kg)  04/04/22 184 lb (83.5 kg)  04/02/22 181 lb 6 oz (82.3 kg)  02/24/22 160 lb 15 oz (73 kg)     Physical Exam Vitals and nursing note reviewed.  Constitutional:      General: He is not in acute distress.    Appearance: He is well-developed.  Cardiovascular:     Rate and Rhythm: Normal rate and regular rhythm.  Pulmonary:     Effort: Pulmonary effort is normal.     Breath sounds:  Normal breath sounds.  Skin:    General: Skin is warm and dry.  Neurological:     Mental Status: He is alert and oriented to person, place, and time.      Lab Results:  CBC    Component Value Date/Time   WBC 12.8 (H) 06/18/2022 1135   RBC 3.33 (L) 06/18/2022 1135   HGB 12.2 (L) 06/18/2022 1135   HCT 34.9 (L) 06/18/2022 1135   PLT 71.0 (L) 06/18/2022 1135   MCV 104.8 (H) 06/18/2022 1135   MCH 36.7 (H) 03/20/2022 1015   MCHC 34.9 06/18/2022 1135   RDW 16.6 (H) 06/18/2022 1135   LYMPHSABS 2.3 02/24/2022 0935   MONOABS 0.9 02/24/2022 0935   EOSABS 0.4 02/24/2022 0935   BASOSABS 0.1 02/24/2022 0935    BMET    Component Value Date/Time   NA 134 (L) 06/18/2022 1135   K 4.1 06/18/2022 1135   CL 100 06/18/2022 1135   CO2 27 06/18/2022 1135   GLUCOSE 87 06/18/2022 1135   BUN 12  06/18/2022 1135   CREATININE 0.84 06/18/2022 1135   CALCIUM 8.8 06/18/2022 1135   GFRNONAA >60 03/20/2022 1015   GFRAA >60 01/26/2018 0617    BNP    Component Value Date/Time   BNP 254.4 (H) 02/21/2022 0835    ProBNP No results found for: "PROBNP"  Imaging: US Paracentesis  Result Date: 06/19/2022 INDICATION: Cirrhosis with accompanying ascites EXAM: ULTRASOUND GUIDED right PARACENTESIS MEDICATIONS: 6 cc 1% lidocaine COMPLICATIONS: None immediate. PROCEDURE: Informed written consent was obtained from the patient after a discussion of the risks, benefits and alternatives to treatment. A timeout was performed prior to the initiation of the procedure. Initial ultrasound scanning demonstrates a moderate amount of ascites within the right lower abdominal quadrant. The right lower abdomen was prepped and draped in the usual sterile fashion. 1% lidocaine was used for local anesthesia. Following this, a 19 gauge, 7-cm, Yueh catheter was introduced. An ultrasound image was saved for documentation purposes. The paracentesis was performed. The catheter was removed and a dressing was applied. The patient tolerated the procedure well without immediate post procedural complication. Patient did not require postprocedural albumin. FINDINGS: A total of approximately 550 mL of thin yellow fluid was removed. Samples were sent to the laboratory as requested by the clinical team. IMPRESSION: Successful ultrasound-guided paracentesis yielding 550 milliliters of peritoneal fluid. PLAN: If the patient eventually requires >/=2 paracenteses in a 30 day period, candidacy for formal evaluation by the [facility] will be assessed. Read by: Mina Marble, PA-C Electronically Signed   By: Marliss Coots M.D.   On: 06/19/2022 16:02     Assessment & Plan:   Anasarca 2. Liver failure without hepatic coma, unspecified chronicity (HCC)   Continue to follow with GI     Follow up:  Follow up in 6 months or sooner if needed     Ivonne Andrew, NP 07/02/2022

## 2022-07-02 NOTE — Patient Instructions (Addendum)
1. Anasarca  2. Liver failure without hepatic coma, unspecified chronicity (HCC)   Continue to follow with GI    Follow up:  Follow up in 6 months or sooner if needed

## 2022-07-02 NOTE — Assessment & Plan Note (Signed)
2. Liver failure without hepatic coma, unspecified chronicity (HCC)   Continue to follow with GI    Follow up:  Follow up in 6 months or sooner if needed

## 2022-07-10 ENCOUNTER — Telehealth: Payer: Self-pay | Admitting: Clinical

## 2022-07-10 NOTE — Telephone Encounter (Signed)
Integrated Behavioral Health Case Management Referral Note  07/10/2022 Name: Nathaniel Warren MRN: [id number] DOB: September 10, 1966 Nathaniel Warren is a 56 y.o. year old male who sees Ivonne Andrew, NP for primary care. LCSW was consulted to assess patient's needs and assist the patient with Mental Health Counseling and Resources.  Interpreter: No.   Interpreter Name & Language: none  Assessment: Patient experiencing mental health concerns. PCP has referred him for psychiatry med management but he has been unable to get an appointment. Patient is uninsured.  Intervention: CSW followed up with patient by phone. Patient indicated he had some appointments scheduled at [facility] but they were cancelled. Confirmed that patient is uninsured and advised that because of this, he cannot be seen at [facility]. Referred patient to [facility]) and assisted patient in making appointment for assessment for 07/15/22.   SDOH (Social Determinants of Health) assessments performed: No  Review of patient status, including review of consultants reports, relevant laboratory and other test results, and collaboration with appropriate care team members and the patient's provider was performed as part of comprehensive patient evaluation and provision of services.    Abigail Butts, LCSW [facility] [phone]

## 2022-07-15 ENCOUNTER — Encounter (HOSPITAL_COMMUNITY): Payer: Self-pay | Admitting: Mental Health

## 2022-07-15 ENCOUNTER — Ambulatory Visit (INDEPENDENT_AMBULATORY_CARE_PROVIDER_SITE_OTHER): Payer: No Payment, Other | Admitting: Mental Health

## 2022-07-15 ENCOUNTER — Encounter (HOSPITAL_COMMUNITY): Payer: Self-pay

## 2022-07-15 DIAGNOSIS — F332 Major depressive disorder, recurrent severe without psychotic features: Secondary | ICD-10-CM | POA: Diagnosis not present

## 2022-07-15 DIAGNOSIS — F4310 Post-traumatic stress disorder, unspecified: Secondary | ICD-10-CM | POA: Insufficient documentation

## 2022-07-15 DIAGNOSIS — F411 Generalized anxiety disorder: Secondary | ICD-10-CM

## 2022-07-15 NOTE — Plan of Care (Signed)
  Problem: Depression CCP Problem  1 Major depression Goal: LTG: Reduce frequency, intensity, and duration of depression symptoms as evidenced by: SSB input needed on appropriate metric Outcome: Initial Goal: STG: Teandre WILL IDENTIFY x5  COGNITIVE PATTERNS AND BELIEFS THAT SUPPORT DEPRESSION AND WORK TO REFRAME/RESTRUCTURE THOUGHTS 7/7 DAYS.   Outcome: Initial   Problem: PTSD-Trauma Disorder CCP Problem  1 PTSD Goal: LTG: Reduce frequency, intensity, and duration of PTSD symptoms so daily functioning is improved: Input needed on appropriate metric.  Patient will score less than 10 on the PCL 5 Outcome: Initial Goal: STG: Report a decrease in PTSD symptoms as evidenced by a 50% reduction in overall score on the PCL-5 (Post traumatic checklist for DSM-5) Outcome: Initial
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
## 2022-07-18 ENCOUNTER — Other Ambulatory Visit: Payer: Self-pay

## 2022-07-18 ENCOUNTER — Ambulatory Visit (HOSPITAL_COMMUNITY): Payer: Self-pay | Admitting: Physician Assistant

## 2022-07-18 DIAGNOSIS — K7290 Hepatic failure, unspecified without coma: Secondary | ICD-10-CM

## 2022-07-18 MED ORDER — SPIRONOLACTONE 50 MG PO TABS: 50.0000 mg | ORAL_TABLET | Freq: Every morning | ORAL | 3 refills | Status: DC

## 2022-07-18 MED ORDER — FUROSEMIDE 40 MG PO TABS: 40.0000 mg | ORAL_TABLET | Freq: Every day | ORAL | 3 refills | Status: DC

## 2022-07-21 ENCOUNTER — Other Ambulatory Visit (INDEPENDENT_AMBULATORY_CARE_PROVIDER_SITE_OTHER): Payer: Self-pay

## 2022-07-21 DIAGNOSIS — K7460 Unspecified cirrhosis of liver: Secondary | ICD-10-CM

## 2022-07-21 DIAGNOSIS — K7290 Hepatic failure, unspecified without coma: Secondary | ICD-10-CM

## 2022-07-21 LAB — BASIC METABOLIC PANEL
BUN: 11 mg/dL (ref 6–23)
CO2: 28 mEq/L (ref 19–32)
Calcium: 9 mg/dL (ref 8.4–10.5)
Chloride: 101 mEq/L (ref 96–112)
Creatinine, Ser: 1 mg/dL (ref 0.40–1.50)
GFR: 85.36 mL/min (ref >60.00)
Glucose, Bld: 65 mg/dL — ABNORMAL LOW (ref 70–99)
Potassium: 3.8 mEq/L (ref 3.5–5.1)
Sodium: 137 mEq/L (ref 135–145)

## 2022-07-29 ENCOUNTER — Ambulatory Visit (INDEPENDENT_AMBULATORY_CARE_PROVIDER_SITE_OTHER): Payer: No Payment, Other | Admitting: Mental Health

## 2022-07-29 DIAGNOSIS — F411 Generalized anxiety disorder: Secondary | ICD-10-CM

## 2022-07-29 DIAGNOSIS — F332 Major depressive disorder, recurrent severe without psychotic features: Secondary | ICD-10-CM
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
## 2022-07-30 NOTE — Progress Notes (Signed)
   THERAPIST PROGRESS NOTE  Session Time: 10:08 am (53 minutes)   Participation Level: Active  Behavioral Response: CasualAlertDepressed  Type of Therapy: Individual Therapy  Treatment Goals addressed: Nathaniel Warren WILL IDENTIFY x5  COGNITIVE PATTERNS AND BELIEFS THAT SUPPORT DEPRESSION AND WORK TO REFRAME/RESTRUCTURE THOUGHTS 7/7 DAYS.    ProgressTowards Goals: Not Progressing  Interventions: CBT and Supportive  Summary: Nathaniel Warren is a 56 y.o. male who presents with sx of depression and anxiety. Nathaniel Warren presents alert and oriented; mood and affect low; depressed. Speech clear and coherent at normal rate and tone. Engaged and receptive to interventions. Displays good eye-contact. Nathaniel Warren shares to continue to experiences sxs of depression and shares racing and over thinking thoughts. Reports difficulty sleeping. Shares ongoing worry about recent dx and having to attend several doctor's appointments. Shares will be looking into getting disability and trying to apply for Medicaid. Nathaniel Warren shares stress over children with son being incarcerated and daughter to be in a relationship with a man who is not helpful towards the children and drinks in excess. Able to process with clinician things in which are and are not within his control. Shares to feel as if depression has been increasing with increased isolative behaviors. Shares to have had opportunities to engage with others - girlfriend and her family but denies. Able to process within therapist abilit to engage in activities and not allow self to remuniate and stay stuck in feelings. Agrees to work on increased level of engagement with others and work to start to journal to express thoughts and feelings and working to let go of unhelpful thoughts. Denies safety concerns.    Suicidal/Homicidal: Nowithout intent/plan  Therapist Response: Therapist engaged Nathaniel Warren in therapy session. Completed check in and assessed for current level of functioning, sxs  management and level of stressors. Provided safe space for Nathaniel Warren to share thoughts and feelings. Provided support and encouragement; validated feelings. Engaged Nathaniel Warren in processing ability to allow self to cope with ruminating thoughts of stress of children and medical concerns and working to write down thoughts and feelings and working to mentally putting concerns 'on shelf' til next medical appointment. Explored ability to distinguish things that are and arent' not within his control. Explored ability to engage with natural supports and working to increase joy in daily life. Encouraged Nathaniel Warren to not sit in feelings of depression and discouraged isolation. Encouraged Nathaniel Warren working to identify beliefs and behaviors that support and perpetuate feelings of depression. Discussed ability to engage in behavioral activation to work to increase emotions and allow depression to decrease. Assessed for safety.   Plan: Return again in x1 weeks.  Diagnosis: MDD (major depressive disorder), recurrent severe, without psychosis (HCC)  GAD (generalized anxiety disorder)  Collaboration of Care: Other None  Patient/Guardian was advised Release of Information must be obtained prior to any record release in order to collaborate their care with an outside provider. Patient/Guardian was advised if they have not already done so to contact the registration department to sign all necessary forms in order for [place] to release information regarding their care.   Consent: Patient/Guardian gives verbal consent for treatment and assignment of benefits for services provided during this visit. Patient/Guardian expressed understanding and agreed to proceed.   Stephan Minister [place], [facility] 07/29/22

## 2022-07-31 ENCOUNTER — Ambulatory Visit: Payer: Self-pay | Admitting: Physician Assistant

## 2022-08-03 ENCOUNTER — Emergency Department (HOSPITAL_COMMUNITY): Payer: Self-pay

## 2022-08-03 ENCOUNTER — Encounter (HOSPITAL_COMMUNITY): Payer: Self-pay | Admitting: Emergency Medicine

## 2022-08-03 ENCOUNTER — Other Ambulatory Visit: Payer: Self-pay

## 2022-08-03 ENCOUNTER — Emergency Department (HOSPITAL_COMMUNITY)
Admission: EM | Admit: 2022-08-03 | Discharge: 2022-08-03 | Disposition: A | Discharge status: Home / Self Care | Payer: Self-pay | Attending: Emergency Medicine | Admitting: Emergency Medicine

## 2022-08-03 DIAGNOSIS — Y92002 Bathroom of unspecified non-institutional (private) residence single-family (private) house as the place of occurrence of the external cause: Secondary | ICD-10-CM | POA: Insufficient documentation

## 2022-08-03 DIAGNOSIS — M25551 Pain in right hip: Secondary | ICD-10-CM | POA: Insufficient documentation

## 2022-08-03 DIAGNOSIS — S60221A Contusion of right hand, initial encounter: Secondary | ICD-10-CM | POA: Insufficient documentation

## 2022-08-03 DIAGNOSIS — Y906 Blood alcohol level of 120-199 mg/100 ml: Secondary | ICD-10-CM | POA: Insufficient documentation

## 2022-08-03 DIAGNOSIS — W182XXA Fall in (into) shower or empty bathtub, initial encounter: Secondary | ICD-10-CM | POA: Insufficient documentation

## 2022-08-03 DIAGNOSIS — W19XXXA Unspecified fall, initial encounter: Secondary | ICD-10-CM

## 2022-08-03 DIAGNOSIS — R780 Finding of alcohol in blood: Secondary | ICD-10-CM | POA: Insufficient documentation

## 2022-08-03 DIAGNOSIS — R791 Abnormal coagulation profile: Secondary | ICD-10-CM | POA: Insufficient documentation

## 2022-08-03 DIAGNOSIS — S0990XA Unspecified injury of head, initial encounter: Secondary | ICD-10-CM | POA: Insufficient documentation

## 2022-08-03 DIAGNOSIS — M79604 Pain in right leg: Secondary | ICD-10-CM | POA: Insufficient documentation

## 2022-08-03 LAB — COMPREHENSIVE METABOLIC PANEL
ALT: 31 U/L (ref 0–44)
AST: 148 U/L — ABNORMAL HIGH (ref 15–41)
Albumin: 2.9 g/dL — ABNORMAL LOW (ref 3.5–5.0)
Alkaline Phosphatase: 207 U/L — ABNORMAL HIGH (ref 38–126)
Anion gap: 12 (ref 5–15)
BUN: 11 mg/dL (ref 6–20)
CO2: 23 mmol/L (ref 22–32)
Calcium: 8.8 mg/dL — ABNORMAL LOW (ref 8.9–10.3)
Chloride: 100 mmol/L (ref 98–111)
Creatinine, Ser: 0.91 mg/dL (ref 0.61–1.24)
GFR, Estimated: >60 mL/min (ref >60)
Glucose, Bld: 101 mg/dL — ABNORMAL HIGH (ref 70–99)
Potassium: 5 mmol/L (ref 3.5–5.1)
Sodium: 135 mmol/L (ref 135–145)
Total Bilirubin: 4.6 mg/dL — ABNORMAL HIGH (ref 0.3–1.2)
Total Protein: 8.1 g/dL (ref 6.5–8.1)

## 2022-08-03 LAB — PROTIME-INR
INR: 1.5 — ABNORMAL HIGH (ref 0.8–1.2)
Prothrombin Time: 17.5 seconds — ABNORMAL HIGH (ref 11.4–15.2)

## 2022-08-03 LAB — CBC WITH DIFFERENTIAL/PLATELET
Abs Immature Granulocytes: 0.04 10*3/uL (ref 0.00–0.07)
Basophils Absolute: 0.1 10*3/uL (ref 0.0–0.1)
Basophils Relative: 1 %
Eosinophils Absolute: 0.1 10*3/uL (ref 0.0–0.5)
Eosinophils Relative: 1 %
HCT: 33.3 % — ABNORMAL LOW (ref 39.0–52.0)
Hemoglobin: 12.3 g/dL — ABNORMAL LOW (ref 13.0–17.0)
Immature Granulocytes: 0 %
Lymphocytes Relative: 24 %
Lymphs Abs: 2.2 10*3/uL (ref 0.7–4.0)
MCH: 37.2 pg — ABNORMAL HIGH (ref 26.0–34.0)
MCHC: 36.9 g/dL — ABNORMAL HIGH (ref 30.0–36.0)
MCV: 100.6 fL — ABNORMAL HIGH (ref 80.0–100.0)
Monocytes Absolute: 1.2 10*3/uL — ABNORMAL HIGH (ref 0.1–1.0)
Monocytes Relative: 14 %
Neutro Abs: 5.3 10*3/uL (ref 1.7–7.7)
Neutrophils Relative %: 60 %
Platelets: 82 10*3/uL — ABNORMAL LOW (ref 150–400)
RBC: 3.31 MIL/uL — ABNORMAL LOW (ref 4.22–5.81)
RDW: 17.7 % — ABNORMAL HIGH (ref 11.5–15.5)
WBC: 9.1 10*3/uL (ref 4.0–10.5)
nRBC: 0 % (ref 0.0–0.2)

## 2022-08-03 LAB — ETHANOL: Alcohol, Ethyl (B): 148 mg/dL — ABNORMAL HIGH (ref <10)

## 2022-08-03 MED ORDER — FENTANYL CITRATE PF 50 MCG/ML IJ SOSY
50.0000 ug | PREFILLED_SYRINGE | Freq: Once | INTRAMUSCULAR | Status: AC
  Administered 2022-08-03: 50 ug via INTRAVENOUS
  Filled 2022-08-03: qty 1

## 2022-08-03 NOTE — ED Notes (Signed)
Patient transported to CT 

## 2022-08-03 NOTE — ED Provider Notes (Signed)
[facility] EMERGENCY DEPARTMENT Provider Note   CSN: [id number] Arrival date & time: 08/03/22  1131     History  Chief Complaint  Patient presents with   Nathaniel Warren is a 56 y.o. 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  Patient reports that last night, he was in the shower, when he went to wash one foot, slipped and fell.  He landed on his right side.  Denies hitting his head or LOC.  Since then, patient has been unable to bear weight on his right leg or ambulate.  Patient rolled out of shower, crawled to his bed.  This morning, patient reports pain was worsened, he was still unable to bear weight, which caused him to call EMS.  Patient reports he was drinking last night.  Denies any chest pain, difficulty breathing, dizziness before the fall.  Denies any nausea, vomiting, abdominal pain.  Home Medications Prior to Admission medications   Medication Sig Start Date End Date Taking? Authorizing Provider  furosemide (LASIX) 40 MG tablet Take 1 tablet (40 mg total) by mouth daily. 07/18/22   Esterwood, Amy S, PA-C  gabapentin (NEURONTIN) 300 MG capsule Take 1 capsule (300 mg total) by mouth 3 (three) times daily. For withdrawals 07/01/22   Ivonne Andrew, NP  potassium chloride SA (KLOR-CON M) 20 MEQ tablet Take 1 tablet (20 mEq total) by mouth 2 (two) times daily for 2 days. 04/28/22 04/30/22  Esterwood, Amy S, PA-C  QUEtiapine (SEROQUEL) 200 MG tablet Take 1 tablet (200 mg total) by mouth at bedtime. For mood control 04/02/22   Ivonne Andrew, NP  spironolactone (ALDACTONE) 50 MG tablet Take 1 tablet (50 mg total) by mouth in the morning. 07/18/22   Esterwood, Amy S, PA-C      Allergies    Asa [aspirin]    Review of Systems   Review of Systems As in HPI.  Physical Exam Updated Vital Signs Ht 5\' 8"  (1.727 m)    Wt 80.6 kg   BMI 27.02 kg/m  Physical Exam Vitals and nursing note reviewed.  Constitutional:      General: He is not in acute distress.    Appearance: Normal appearance. He is well-developed. He is not ill-appearing or diaphoretic.  HENT:     Head: Normocephalic and atraumatic.     Right Ear: External ear normal.     Left Ear: External ear normal.     Nose: Nose normal.  Cardiovascular:     Rate and Rhythm: Normal rate and regular rhythm.     Heart sounds: No murmur heard. Pulmonary:     Effort: Pulmonary effort is normal. No respiratory distress.     Breath sounds: Normal breath sounds. No wheezing.  Abdominal:     Palpations: Abdomen is soft.     Tenderness: There is no abdominal tenderness. There is no guarding.     Comments: Well-healed midline surgical scar.  Musculoskeletal:        General: No swelling or deformity.     Cervical back: Neck supple.     Right lower leg: No edema.     Left lower leg: No edema.     Comments: No obvious deformity of right lower extremity.  Pain with hip flexion.  Distal pulses intact.  Neurovascularly intact.  Ecchymosis over lateral aspect of right hand.  Normal  strength.  2+ radial pulses.  Skin:    General: Skin is warm and dry.  Neurological:     Mental Status: He is alert.    ED Results / Procedures / Treatments   Labs (all labs ordered are listed, but only abnormal results are displayed) Labs Reviewed  CBC WITH DIFFERENTIAL/PLATELET  COMPREHENSIVE METABOLIC PANEL  PROTIME-INR  ETHANOL    EKG EKG Interpretation  Date/Time:  Sunday August 03 2022 11:52:43 EDT Ventricular Rate:  65 PR Interval:  159 QRS Duration: 103 QT Interval:  434 QTC Calculation: 452 R Axis:   -56 Text Interpretation: Sinus rhythm Left anterior fascicular block Abnormal R-wave progression, early transition Confirmed by Virgina Norfolk ([id number]) on 08/03/2022 11:54:54 AM  Radiology CT Head Wo Contrast  Result Date: 08/03/2022 CLINICAL DATA:  Neck trauma,  intoxicated or obtunded (Age >= 16y); Head trauma, moderate-severe EXAM: CT HEAD WITHOUT CONTRAST CT CERVICAL SPINE WITHOUT CONTRAST TECHNIQUE: Multidetector CT imaging of the head and cervical spine was performed following the standard protocol without intravenous contrast. Multiplanar CT image reconstructions of the cervical spine were also generated. RADIATION DOSE REDUCTION: This exam was performed according to the departmental dose-optimization program which includes automated exposure control, adjustment of the mA and/or kV according to patient size and/or use of iterative reconstruction technique. COMPARISON:  10/20/2017 FINDINGS: CT HEAD FINDINGS Brain: No evidence of acute infarction, hemorrhage, hydrocephalus, extra-axial collection or mass lesion/mass effect. Vascular: No hyperdense vessel or unexpected calcification. Skull: Normal. Negative for fracture or focal lesion. Sinuses/Orbits: No acute finding. Other: Periapical lucencies of the imaged maxilla secondary to poor underlying dentition. CT CERVICAL SPINE FINDINGS Alignment: Facet joints are aligned without dislocation or traumatic listhesis. Dens and lateral masses are aligned. Straightening of the cervical lordosis. Skull base and vertebrae: No acute fracture. No primary bone lesion or focal pathologic process. Soft tissues and spinal canal: No prevertebral fluid or swelling. No visible canal hematoma. Disc levels: Degenerative disc disease of C5-6 and C6-7. No advanced facet arthropathy. Upper chest: Included lung apices are clear. Other: None. IMPRESSION: 1. No acute intracranial abnormality. 2. No acute fracture or subluxation of the cervical spine. 3. Periapical lucencies of the imaged maxilla secondary to poor underlying dentition. Electronically Signed   By: Duanne Guess D.O.   On: 08/03/2022 13:20   CT Cervical Spine Wo Contrast  Result Date: 08/03/2022 CLINICAL DATA:  Neck trauma, intoxicated or obtunded (Age >= 16y); Head trauma,  moderate-severe EXAM: CT HEAD WITHOUT CONTRAST CT CERVICAL SPINE WITHOUT CONTRAST TECHNIQUE: Multidetector CT imaging of the head and cervical spine was performed following the standard protocol without intravenous contrast. Multiplanar CT image reconstructions of the cervical spine were also generated. RADIATION DOSE REDUCTION: This exam was performed according to the departmental dose-optimization program which includes automated exposure control, adjustment of the mA and/or kV according to patient size and/or use of iterative reconstruction technique. COMPARISON:  10/20/2017 FINDINGS: CT HEAD FINDINGS Brain: No evidence of acute infarction, hemorrhage, hydrocephalus, extra-axial collection or mass lesion/mass effect. Vascular: No hyperdense vessel or unexpected calcification. Skull: Normal. Negative for fracture or focal lesion. Sinuses/Orbits: No acute finding. Other: Periapical lucencies of the imaged maxilla secondary to poor underlying dentition. CT CERVICAL SPINE FINDINGS Alignment: Facet joints are aligned without dislocation or traumatic listhesis. Dens and lateral masses are aligned. Straightening of the cervical lordosis. Skull base and vertebrae: No acute fracture. No primary bone lesion or focal pathologic process. Soft tissues and spinal canal: No prevertebral fluid or swelling. No visible canal hematoma. Disc  levels: Degenerative disc disease of C5-6 and C6-7. No advanced facet arthropathy. Upper chest: Included lung apices are clear. Other: None. IMPRESSION: 1. No acute intracranial abnormality. 2. No acute fracture or subluxation of the cervical spine. 3. Periapical lucencies of the imaged maxilla secondary to poor underlying dentition. Electronically Signed   By: Duanne Guess D.O.   On: 08/03/2022 13:20   DG Wrist Complete Right  Result Date: 08/03/2022 CLINICAL DATA:  Pain post fall EXAM: RIGHT WRIST - COMPLETE 3+ VIEW COMPARISON:  07/19/2009 FINDINGS: There is no evidence of fracture or  dislocation. There is no evidence of arthropathy or other focal bone abnormality. Old healed fracture deformity of the fifth metacarpal. Soft tissues are unremarkable. IMPRESSION: No acute findings Electronically Signed   By: Corlis Leak M.D.   On: 08/03/2022 12:43   DG Pelvis 1-2 Views  Result Date: 08/03/2022 CLINICAL DATA:  fall EXAM: PELVIS - 1-2 VIEW COMPARISON:  CT 03/20/2022 FINDINGS: There is no evidence of pelvic fracture or diastasis. No pelvic bone lesions are seen. Pelvic phleboliths. IMPRESSION: Negative. Electronically Signed   By: Corlis Leak M.D.   On: 08/03/2022 12:42   DG Hand Complete Right  Result Date: 08/03/2022 CLINICAL DATA:  Pain post fall EXAM: RIGHT HAND - COMPLETE 3+ VIEW COMPARISON:  07/19/2009 FINDINGS: There is no evidence of acute fracture or dislocation. Old healed fracture deformity of the fifth metacarpal. There is no evidence of arthropathy or other focal bone abnormality. Soft tissues are unremarkable. IMPRESSION: No acute findings. Electronically Signed   By: Corlis Leak M.D.   On: 08/03/2022 12:42   DG Femur Min 2 Views Right  Result Date: 08/03/2022 CLINICAL DATA:  Larey Seat, pain EXAM: RIGHT FEMUR 2 VIEWS COMPARISON:  CT 02/21/2022 FINDINGS: There is no evidence of fracture or other focal bone lesions. Soft tissues are unremarkable. Pelvic phleboliths. IMPRESSION: Negative. Electronically Signed   By: Corlis Leak M.D.   On: 08/03/2022 12:40    Procedures Procedures   Medications Ordered in ED Medications - No data to display  ED Course/ Medical Decision Making/ A&P                           Medical Decision Making Amount and/or Complexity of Data Reviewed Labs: ordered. Radiology: ordered.  Risk Prescription drug management.   Nathaniel Warren is a 56 y.o. 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   Vitals at  presentation within normal limits.  Patient is hemodynamically stable, afebrile, satting well on room air.  Physical exam reassuring.  Normal heart sounds.  Lungs clear to auscultation bilaterally.  Abdomen is soft, nontender to palpation.  No pitting edema.  No external evidence of trauma or obvious deformity of the right lower extremity, though significant pain with movement of right lower extremity.  Bruising over right lateral hand.  Initial differential includes but is not limited to: Hip/right leg fracture, muscular strain, traumatic injury (soft tissue injury)  Lab work obtained, resulted notable for elevated alcohol level 148.  INR 1.5, likely consistent with patient's known history of liver cirrhosis.  CBC without leukocytosis.  Hemoglobin 12.3, consistent with baseline.  CMP shows electrolytes within normal limits.  No evidence of AKI.  LFTs elevated as his total bilirubin, consistent with prior and known history of liver cirrhosis.  Imaging was pursued, revealed no acute traumatic  injury.  EKG obtained, demonstrates sinus rhythm, ventricular rate 65 bpm.  No evidence of abnormal intervals or acute ischemia.  Interpreted by myself and my attending.  Interventions include IV fentanyl  Patient updated regarding results.  Patient able to ambulate without difficulty while in the ED.  I advised symptomatic management for his pain over the next few days including Motrin every 4-6 hours.  Patient should be wary of taking Tylenol consistently given liver cirrhosis and concurrent alcohol use.  I also advised the patient to decrease his drinking, as this is likely contributing to his difficulty with balance.  The patient is safe and stable for discharge at this time with return precautions provided and a plan for follow-up care in place as needed.  The plan for this patient was discussed with Dr. Lockie Mola, who voiced agreement and who oversaw evaluation and treatment of this patient.   Final Clinical  Impression(s) / ED Diagnoses Final diagnoses:  Fall, initial encounter  Right leg pain    Rx / DC Orders ED Discharge Orders     None         Skeet Simmer, MD 08/03/22 1458    Virgina Norfolk, DO 08/06/22 [phone]

## 2022-08-03 NOTE — ED Provider Notes (Signed)
Supervise resident visit.  Patient here after mechanical fall in the shower yesterday.  Mostly here with right hand pain and right hip pain.  Having a difficult time walking since a fall last night.  Admits alcohol use overnight as well.  Not sure if he hit his head or lost consciousness.  Has a history of alcoholic cirrhosis.  Not on blood thinners.  History of stroke and seizures.  Nothing makes it worse or better except for movement.  He is tender in the right hip, right hand.  Right hand has some swelling.  Supposedly he might have been on Coumadin here recently for some reason that he is not aware of.  He has not been on it for couple days.  Vital signs are normal.  We will get CT scan of the head, neck.  X-rays of the right hip, right hand and check basic labs.  Differential diagnosis is fracture versus contusion.  Please see resident note for further results, evaluation, disposition of the patient.  I will continue to help with medical decision making.  This chart was dictated using voice recognition software.  Despite best efforts to proofread,  errors can occur which can change the documentation meaning.    Virgina Norfolk, DO 08/03/22 1236

## 2022-08-03 NOTE — Progress Notes (Signed)
Orthopedic Tech Progress Note Patient Details:  Nathaniel Warren 10-03-1966 [id number]  Ortho Devices Type of Ortho Device: Crutches Ortho Device/Splint Interventions: Ordered, Application, Adjustment   Post Interventions Patient Tolerated: Well, Ambulated well Instructions Provided: Adjustment of device, Poper ambulation with device  [place] A Gerilyn Pilgrim 08/03/2022, 4:02 PM

## 2022-08-03 NOTE — Discharge Instructions (Addendum)
You were seen today for right leg pain and right hand pain after fall.  Your blood work is reassuring.  Imaging studies do not show any new traumatic injuries.  Please follow-up with your primary care doctor in the next 2 to 3 days regarding today's visit and your symptoms. You may take Motrin every 4-6 hours as needed to help with your pain.  Use Tylenol sparingly for pain, as it can worsen your liver function and interact with alcohol.   Try to limit or decrease your drinking, as it is likely worsening your balance and may contribute to future falls. Please return to the emergency department if you experience any chest pain, difficulty breathing, abdominal pain, altered mental status, severe fever, or any other concerning symptom.  Thank you for allowing [place] to participate in your care.

## 2022-08-03 NOTE — ED Triage Notes (Signed)
Pt BIB [facility] from home due to mechanical fall he had last night in the shower.  He states he did not hit his head but everything on his right side.  Pt reports right wrist pain and right hip pain.  Right leg is a little shorter than the other per [facility].  Pt states he has been having balancing issues recently.  Denies hx of stroke.  Warfarin has been stopped since Thursday 8/24 from PCP.  18g in left [facility].  200mg  of fentanyl given.  VS BP 139/78, Pulse 76, 96% SpO2, Resp 20.

## 2022-08-05 ENCOUNTER — Other Ambulatory Visit: Payer: Self-pay

## 2022-08-05 ENCOUNTER — Ambulatory Visit (INDEPENDENT_AMBULATORY_CARE_PROVIDER_SITE_OTHER): Payer: Medicaid Other | Admitting: Gastroenterology

## 2022-08-05 ENCOUNTER — Encounter: Payer: Self-pay | Admitting: Gastroenterology

## 2022-08-05 VITALS — BP 118/68 | HR 99 | Ht 69.0 in

## 2022-08-05 DIAGNOSIS — K7031 Alcoholic cirrhosis of liver with ascites: Secondary | ICD-10-CM

## 2022-08-05 DIAGNOSIS — F1090 Alcohol use, unspecified, uncomplicated: Secondary | ICD-10-CM

## 2022-08-05 DIAGNOSIS — Z1211 Encounter for screening for malignant neoplasm of colon: Secondary | ICD-10-CM | POA: Diagnosis not present

## 2022-08-05 DIAGNOSIS — K766 Portal hypertension: Secondary | ICD-10-CM

## 2022-08-05 DIAGNOSIS — Z599 Problem related to housing and economic circumstances, unspecified: Secondary | ICD-10-CM

## 2022-08-05 NOTE — Patient Instructions (Addendum)
_______________________________________________________  If you are age 54 or older, your body mass index should be between 23-30. Your Body mass index is 26.24 kg/m. If this is out of the aforementioned range listed, please consider follow up with your Primary Care Provider.  If you are age 54 or younger, your body mass index should be between 19-25. Your Body mass index is 26.24 kg/m. If this is out of the aformentioned range listed, please consider follow up with your Primary Care Provider.   ________________________________________________________  The [facility] providers would like to encourage you to use [facility] to communicate with providers for non-urgent requests or questions.  Due to long hold times on the telephone, sending your provider a message by [facility] may be a faster and more efficient way to get a response.  Please allow 48 business hours for a response.  Please remember that this is for non-urgent requests.  _______________________________________________________  Your provider has requested that you go to the basement level for lab work before leaving today. Press "B" on the elevator. The lab is located at the first door on the left as you exit the elevator.  Due to recent changes in healthcare laws, you may see the results of your imaging and laboratory studies on MyChart before your provider has had a chance to review them.  We understand that in some cases there may be results that are confusing or concerning to you. Not all laboratory results come back in the same time frame and the provider may be waiting for multiple results in order to interpret others.  Please give [place] 48 hours in order for your provider to thoroughly review all the results before contacting the office for clarification of your results.   Please start a high vitamin K diet.  Start Vitamin K 1-2 a day.   Vitamin K Foods and Warfarin Warfarin is a blood thinner (anticoagulant). Anticoagulant  medicines help prevent blood clots from forming or getting bigger. Warfarin works by blocking the activity of vitamin K. Vitamin K promotes normal blood clotting. When you take warfarin, problems can occur from suddenly increasing or decreasing the amount of vitamin K that you eat from one day to the next. These problems can occur due to varying levels of warfarin in your blood. Problems may include blood clots or bleeding. What are tips for eating the right amount of vitamin K? Reading food labels Know which foods contain vitamin K. Read food labels. Use the lists below to understand serving sizes and the amount of vitamin K in one serving. If you take a multivitamin that contains vitamin K, be sure to take it every day. Meal planning To avoid problems when taking warfarin: Eat a balanced diet that includes: Fresh fruits and vegetables. Whole grains. Low-fat dairy products. Lean proteins, such as fish, eggs, and lean cuts of meat. Avoid major changes in your diet. If you are going to change your diet, talk with your health care provider before making changes. Keep your intake of vitamin K consistent from day to day. Avoid eating large amounts of vitamin K one day and small amounts of vitamin K the next day. Work with a dietitian to develop a meal plan that works best for you.  What foods are high in vitamin K? Foods that are high in vitamin K contain more than 100 mcg (micrograms) per serving. These include: Broccoli (cooked from fresh) -  cup (78 g) has 110 mcg. Brussels sprouts (cooked from fresh) -  cup (78  g) has 109 mcg. Greens, beet (cooked from fresh) -  cup (72 g) has 350 mcg. Greens, collard (cooked from fresh) -  cup (66 g) has 263 mcg. Greens, turnip (cooked from fresh) -  cup (72 g) has 265 mcg. Green onions or scallions -  cup (50 g) has 105 mcg. Kale (cooked from fresh) -  cup (68 g) has 536 mcg. Parsley (raw) - 10 sprigs (10 g) has 164 mcg. Spinach (cooked from  fresh) -  cup (90 g) has 444 mcg. Swiss chard (cooked from fresh) -  cup (88 g) has 287 mcg. The items listed above may not be a complete list of foods high in Vitamin K. Actual amounts of Vitamin K may differ depending on processing. Contact a dietitian for more information. What foods have a moderate amount of vitamin K? Foods that have a moderate amount of vitamin K contain 25-100 mcg per serving. These include: Asparagus (cooked from fresh) - 4 spears (60 g) have 30 mcg. Black-eyed peas (dried) -  cup (85 g) has 32 mcg. Cabbage (cooked from fresh) -  cup (78 g) has 84 mcg. Cabbage (raw) -  cup (35 g) has 26 mcg. Kiwi fruit - 1 medium (69 g) has 27 mcg. Lettuce (raw) - 1 cup (36 g) has 45 mcg. Okra (cooked from fresh) -  cup (80 g) has 32 mcg. Prunes (dried) - 5 prunes (47 g) have 25 mcg. Tuna, light, canned in oil - 3 oz (85 g) has 37 mcg. Watercress (raw) - 1 cup (34 g) has 85 mcg. The items listed above may not be a complete list of foods with a moderate amount of Vitamin K. Actual amounts of Vitamin K may differ depending on processing. Contact a dietitian for more information. What foods are low in vitamin K? Foods low in vitamin K contain less than 25 mcg per serving. These include: Artichoke - 1 medium (128 g) has 18 mcg. Avocado - 1 oz (21 g) has 6 mcg. Blueberries -  cup (73 g) has 14 mcg. Carrots (cooked from fresh) -  cup (78 g) has 11 mcg. Cauliflower (raw) -  cup (54 g) has 8 mcg. Cucumber with peel (raw) -  cup (52 g) has 9 mcg. Grapes -  cup (76 g) has 12 mcg. Mango - 1 medium (207 g) has 9 mcg. Mixed nuts - 1 cup (142 g) has 17 mcg. Pear - 1 medium (178 g) has 8 mcg. Peas (cooked from fresh) -  cup (80 g) has 20 mcg. Pickled cucumber - 1 spear (65 g) has 11 mcg. Sauerkraut (canned) -  cup (118 g) has 16 mcg. Soybeans (cooked from fresh) -  cup (86 g) has 16 mcg. Tomato (raw) - 1 medium (123 g) has 10 mcg. Tomato sauce (raw) -  cup (123 g) has 17  mcg. The items listed above may not be a complete list of foods low in Vitamin K. Actual amounts of Vitamin K may differ depending on processing. Contact a dietitian for more information. What foods do not have vitamin K? If a food contains less than 5 mcg per serving, it is considered to have no vitamin K. These foods include: Bread and cereal products. Cheese. Eggs. Fish and shellfish. Meat and poultry. Milk and dairy products. Seeds, such as sunflower or pumpkin seeds. The items listed above may not be a complete list of foods that do not have vitamin K. Actual amounts of vitamin K may differ depending on  processing. Contact a dietitian for more information. Summary Warfarin is an anticoagulant that prevents blood clots from forming or getting bigger by blocking the activity of vitamin K. It is important to know the amount of vitamin K that is in the foods you eat and to keep your intake of vitamin K consistent from day to day. Avoid major changes in your diet. If you are going to change your diet, talk with your health care provider before making changes. This information is not intended to replace advice given to you by your health care provider. Make sure you discuss any questions you have with your health care provider. Document Revised: 01/30/2021 Document Reviewed: 01/30/2021 Elsevier Patient Education  2023 [employer].   It was a pleasure to see you today!  Thank you for trusting me with your gastrointestinal care!

## 2022-08-05 NOTE — Progress Notes (Signed)
GASTROENTEROLOGY OUTPATIENT CLINIC VISIT   Primary Care Provider Ivonne Andrew, NP [address], [address] [place] [address] [phone]   Patient Profile: Nathaniel Warren is a 56 y.o. male with a pmh significant for alcoholic cirrhosis (complicated by portal hypertension manifested as ascites), alcohol use disorder (not in remission), MDD, bipolar, prior seizures, prior CVA.  The patient presents to the [facility] for an evaluation and management of problem(s) noted below:  Problem List 1. Alcoholic cirrhosis of liver with ascites (HCC)   2. Alcohol use disorder   3. Ascites due to alcoholic cirrhosis (HCC)   4. Portal hypertension (HCC)   5. Colon cancer screening   6. Financial difficulties     History of Present Illness Please see prior GI notes for full details of HPI.  Interval History The patient returns for scheduled follow-up.  The patient thankfully has not required another hospitalization for decompensated liver disease since earlier this year.  He has required a total of 3 paracenteses over the last 6 months.  His diuretics seem to be managing his weight appropriately at this time and his weight has been stable over the last few weeks.  He is trying to follow a low-sodium diet.  He has cut down on his alcohol consumption but is still drinking daily.  The patient denies any issues with jaundice, scleral icterus, generalized pruritus, darkened/amber urine, clay-colored stools, LE edema, hematemesis, coffee-ground emesis, abdominal distention, confusion.  Unfortunately he did have a fall a few days ago and has some fracture problems as a result of a fall while showering.  Financially, things have become more tight than they were before.  He had been scheduled for an EGD for variceal screening and a colonoscopy for colon cancer screening earlier this year but missed those appointments.  GI Review of Systems Positive as above Negative for pyrosis,  dysphagia, nausea, vomiting, pain, change in bowel habits, melena, hematochezia  Review of Systems General: Denies fevers/chills/weight loss or gain unintentionally Cardiovascular: Denies chest pain Pulmonary: Denies shortness of breath Gastroenterological: See HPI Genitourinary: Denies darkened urine Hematological: Denies easy bruising/bleeding Dermatological: Denies jaundice Psychological: Mood is stable   Medications Current Outpatient Medications  Medication Sig Dispense Refill   furosemide (LASIX) 40 MG tablet Take 1 tablet (40 mg total) by mouth daily. 30 tablet 3   spironolactone (ALDACTONE) 50 MG tablet Take 1 tablet (50 mg total) by mouth in the morning. 30 tablet 3   gabapentin (NEURONTIN) 300 MG capsule Take 1 capsule (300 mg total) by mouth 3 (three) times daily. For withdrawals 90 capsule 1   PARoxetine (PAXIL) 20 MG tablet Take 1 tablet (20 mg total) by mouth daily. 30 tablet 1   potassium chloride SA (KLOR-CON M) 20 MEQ tablet Take 1 tablet (20 mEq total) by mouth 2 (two) times daily for 2 days. (Patient not taking: Reported on 08/05/2022) 4 tablet 0   QUEtiapine (SEROQUEL) 100 MG tablet Take 1 tablet (100 mg total) by mouth at bedtime. For mood control 30 tablet 1   traZODone (DESYREL) 50 MG tablet Take 1 tablet (50 mg total) by mouth at bedtime. 30 tablet 1   No current facility-administered medications for this visit.    Allergies Allergies  Allergen Reactions   Asa [Aspirin] Other (See Comments)    As a child, had nose bleed.     Histories Past Medical History:  Diagnosis Date   Depression    Hernia, abdominal    Liver failure (  HCC)    Seizures (HCC)    Stroke [facility])    Past Surgical History:  Procedure Laterality Date   ANKLE SURGERY     ANKLE SURGERY Right    SPLENECTOMY     Social History   Socioeconomic History   Marital status: Single    Spouse name: Not on file   Number of children: 2   Years of education: Not on file   Highest education  level: Not on file  Occupational History   Occupation: Corporate investment banker  Tobacco Use   Smoking status: Every Day    Packs/day: 1.00    Years: 30.00    Total pack years: 30.00    Types: Cigarettes   Smokeless tobacco: Former  Building services engineer Use: Never used  Substance and Sexual Activity   Alcohol use: Yes    Comment: 1/2 pint of vodka daily   Drug use: Yes    Types: Marijuana   Sexual activity: Yes    Comment:  wife had hysterectomy  Other Topics Concern   Not on file  Social History Narrative   ** Merged History Encounter **       Social Determinants of Health   Financial Resource Strain: High Risk (07/15/2022)   Overall Financial Resource Strain (CARDIA)    Difficulty of Paying Living Expenses: Very hard  Food Insecurity: No Food Insecurity (07/15/2022)   Hunger Vital Sign    Worried About Running Out of Food in the Last Year: Never true    Ran Out of Food in the Last Year: Never true  Transportation Needs: No Transportation Needs (07/15/2022)   PRAPARE - Administrator, Civil Service (Medical): No    Lack of Transportation (Non-Medical): No  Physical Activity: Inactive (07/15/2022)   Exercise Vital Sign    Days of Exercise per Week: 0 days    Minutes of Exercise per Session: 0 min  Stress: Stress Concern Present (07/15/2022)   [employer] of Occupational Health - Occupational Stress Questionnaire    Feeling of Stress : Very much  Social Connections: Socially Isolated (07/15/2022)   Social Connection and Isolation Panel [NHANES]    Frequency of Communication with Friends and Family: More than three times a week    Frequency of Social Gatherings with Friends and Family: Once a week    Attends Religious Services: Never    Database administrator or Organizations: No    Attends Banker Meetings: Never    Marital Status: Divorced  Catering manager Violence: Not At Risk (07/15/2022)   Humiliation, Afraid, Rape, and Kick questionnaire    Fear  of Current or Ex-Partner: No    Emotionally Abused: No    Physically Abused: No    Sexually Abused: No   Family History  Problem Relation Age of Onset   COPD Mother    Diabetes Father    Stroke Maternal Grandmother    Alzheimer's disease Maternal Grandfather    Cancer Paternal Grandmother    Stomach cancer Paternal Grandfather    Hypertension Other    Colon cancer Neg Hx    Esophageal cancer Neg Hx    Colon polyps Neg Hx    Inflammatory bowel disease Neg Hx    Liver disease Neg Hx    Pancreatic cancer Neg Hx    Rectal cancer Neg Hx    I have reviewed his medical, social, and family history in detail and updated the electronic medical record as necessary.  PHYSICAL EXAMINATION  BP 118/68   Pulse 99   Ht 5\' 9"  (1.753 m)   SpO2 97%   BMI 26.24 kg/m  Wt Readings from Last 3 Encounters:  08/03/22 177 lb 11.1 oz (80.6 kg)  07/02/22 177 lb 9.6 oz (80.6 kg)  05/01/22 173 lb 12.8 oz (78.8 kg)  GEN: NAD, appears stated age, doesn't appear chronically ill PSYCH: Cooperative, without pressured speech EYE: Conjunctivae pink, sclerae anicteric ENT: MMM CV: Nontachycardic RESP: No audible wheezing GI: NABS, soft, protuberant abdomen, rounded, NT, without rebound MSK/EXT: Trace bilateral pedal edema; has a right wrist fracture currently SKIN: Spider angiomata on upper thorax noted; no jaundice NEURO:  Alert & Oriented x 3, no focal deficits, no evidence of asterixis   REVIEW OF DATA  I reviewed the following data at the time of this encounter:  GI Procedures and Studies  No new studies to review  Laboratory Studies  Reviewed those in epic  Imaging Studies  April 2023 CT abdomen pelvis IMPRESSION: 1. No evidence of bowel perforation or bowel obstruction. 2. Cirrhosis with large volume ascites. If there is clinical concern regarding peritonitis, recommend a diagnostic paracentesis. 3. Small left pleural effusion. 4. Aortic Atherosclerosis (ICD10-I70.0).   ASSESSMENT   Mr. Bergerson is a 56 y.o. male with a pmh significant for alcoholic cirrhosis (complicated by portal hypertension manifested as ascites), alcohol use disorder (not in remission), MDD, bipolar, prior seizures, prior CVA.  The patient is seen today for evaluation and management of:  1. Alcoholic cirrhosis of liver with ascites (HCC)   2. Alcohol use disorder   3. Ascites due to alcoholic cirrhosis (HCC)   4. Portal hypertension (HCC)   5. Colon cancer screening   6. Financial difficulties    The patient is hemodynamically stable.  It seems that he has current compensated alcoholic cirrhosis.  Unfortunately, he continues to have alcohol use disorder but per his report and prior documentation he has decreased his intake substantially.  He believes that within the next 6 to 12 months he can continue to decrease and stop alcohol completely.  His weight has been stable and so his diuretics would not be adjusted currently.  He will have some room for 57 to adjust things if necessary.  INR slightly elevated and this may well be liver insufficiency we will give the patient a chance to increase his vitamin K through his diet as well as OTC vitamin K to see if that makes any difference in his INR.  He is going to need colon cancer screening as well as esophageal variceal screening but cannot have those performed currently due to financial stressors.  We will place a recall for these in the late fall and when he is seen in follow-up he will hopefully be in a better state/place to see if he is able to have these procedures performed.  He is using a program called [employer] that he believes may be able to help him from a financial standpoint in regards to getting disability.  We discussed that complete alcohol cessation gives him the best opportunity for some potential liver regeneration in the setting of alcoholic cirrhosis.  He will ice medications other than what is prescribed by his providers.  He may use Tylenol  less than 3000 mg daily.  He will continue to try to perform/do a low-sodium diet (less than 2000 mg/day).  We will maintain a monitoring of his weight to ensure that we are not going to need to make adjustments  in his diuretics.  HCC screening will be due in October and hopefully from a financial standpoint will be feasible to perform.  We will get some laboratories to evaluate for his hepatitis A and hepatitis B vaccines/immunization status.    All patient questions were answered to the best of my ability, and the patient agrees to the aforementioned plan of action with follow-up as indicated.   PLAN  #ESLD Management Volume -Continue Lasix 40 mg daily -Continue spironolactone 50 mg daily (recent potassium 5.0 so not sure increases without rechecking BMP) -Obtain daily standing weight and monitor -1500-2000 mg Na diet Infection -No evidence of SBP on prior And does not have renal insufficiency so no need for prophylaxis Bleeding -Needs EGD at some point for screening Encephalopathy -No treatment currently Screening -Up-to-date and will need updated HCC screening in October 2023 (not clear if patient will be able to afford) Transplant -Still actively drinking so would not be a transplant candidate Vaccination -We will obtain HAV/HBV immune status via labs today -Recommend yearly Influenza vaccine -Recommend Pneumococcal vaccine --Be sure to give Prevnar-13 and in 8-weeks Pneumovax-23 Other -Initiate vitamin K2 100 to 200 mcg daily -Promote intake of 1.5 g/kg/day of Protein (Ensures/Boosts supplementation when able)   Orders Placed This Encounter  Procedures   Hepatitis B Surface AntiBODY   Hepatitis B Core Antibody, total   Hepatitis A Ab, Total    New Prescriptions   PAROXETINE (PAXIL) 20 MG TABLET    Take 1 tablet (20 mg total) by mouth daily.   TRAZODONE (DESYREL) 50 MG TABLET    Take 1 tablet (50 mg total) by mouth at bedtime.   Modified Medications   Modified  Medication Previous Medication   GABAPENTIN (NEURONTIN) 300 MG CAPSULE gabapentin (NEURONTIN) 300 MG capsule      Take 1 capsule (300 mg total) by mouth 3 (three) times daily. For withdrawals    Take 1 capsule (300 mg total) by mouth 3 (three) times daily. For withdrawals   QUETIAPINE (SEROQUEL) 100 MG TABLET QUEtiapine (SEROQUEL) 200 MG tablet      Take 1 tablet (100 mg total) by mouth at bedtime. For mood control    Take 1 tablet (200 mg total) by mouth at bedtime. For mood control    Planned Follow Up No follow-ups on file.   Total Time in Face-to-Face and in Coordination of Care for patient including independent/personal interpretation/review of prior testing, medical history, examination, medication adjustment, communicating results with the patient directly, and documentation within the EHR is 25 minutes.Corliss Parish, MD [facility] Office # [phone]

## 2022-08-06 ENCOUNTER — Inpatient Hospital Stay: Payer: Self-pay | Admitting: Nurse Practitioner

## 2022-08-06 LAB — HEPATITIS A ANTIBODY, TOTAL: Hepatitis A AB,Total: REACTIVE — AB

## 2022-08-06 LAB — HEPATITIS B SURFACE ANTIBODY,QUALITATIVE: Hep B S Ab: NONREACTIVE

## 2022-08-06 LAB — HEPATITIS B CORE ANTIBODY, TOTAL: Hep B Core Total Ab: NONREACTIVE

## 2022-08-07 ENCOUNTER — Ambulatory Visit (INDEPENDENT_AMBULATORY_CARE_PROVIDER_SITE_OTHER): Payer: No Payment, Other | Admitting: Physician Assistant

## 2022-08-07 ENCOUNTER — Ambulatory Visit (INDEPENDENT_AMBULATORY_CARE_PROVIDER_SITE_OTHER): Payer: No Payment, Other | Admitting: Mental Health

## 2022-08-07 ENCOUNTER — Encounter (HOSPITAL_COMMUNITY): Payer: Self-pay | Admitting: Physician Assistant

## 2022-08-07 VITALS — BP 139/60 | HR 94 | Ht 69.0 in | Wt 177.0 lb

## 2022-08-07 DIAGNOSIS — F4310 Post-traumatic stress disorder, unspecified: Secondary | ICD-10-CM

## 2022-08-07 DIAGNOSIS — G479 Sleep disorder, unspecified: Secondary | ICD-10-CM | POA: Diagnosis not present

## 2022-08-07 DIAGNOSIS — F332 Major depressive disorder, recurrent severe without psychotic features: Secondary | ICD-10-CM

## 2022-08-07 DIAGNOSIS — F411 Generalized anxiety disorder: Secondary | ICD-10-CM | POA: Diagnosis not present

## 2022-08-07 MED ORDER — QUETIAPINE FUMARATE 100 MG PO TABS: 100.0000 mg | ORAL_TABLET | Freq: Every day | ORAL | 1 refills | Status: DC

## 2022-08-07 MED ORDER — PAROXETINE HCL 20 MG PO TABS: 20.0000 mg | ORAL_TABLET | Freq: Every day | ORAL | 1 refills | Status: DC

## 2022-08-07 MED ORDER — GABAPENTIN 300 MG PO CAPS: 300.0000 mg | ORAL_CAPSULE | Freq: Three times a day (TID) | ORAL | 1 refills | Status: DC

## 2022-08-07 MED ORDER — TRAZODONE HCL 50 MG PO TABS: 50.0000 mg | ORAL_TABLET | Freq: Every day | ORAL | 1 refills | Status: DC

## 2022-08-07 NOTE — Progress Notes (Signed)
   THERAPIST PROGRESS NOTE  Session Time: 1:35pm ( 53 minutes)  Participation Level: Active  Behavioral Response: CasualAlertDepressed  Type of Therapy: Individual Therapy  Treatment Goals addressed: Nathaniel Warren WILL IDENTIFY x5  COGNITIVE PATTERNS AND BELIEFS THAT SUPPORT DEPRESSION AND WORK TO REFRAME/RESTRUCTURE THOUGHTS 7/7 DAYS.    ProgressTowards Goals: Progressing  Interventions: Supportive  Summary: Nathaniel Warren is a 56 y.o. male who presents with MDD and GAD. Nathaniel Warren presents alert and oriented; mood and affect adequate. Speech clear and coherent at normal rate and tone. Engaged and receptive to interventions. Nathaniel Warren presents on crutches and notes to have have bruising on his hands with right leg observed to be swollen. Nathaniel Warren shares to have fallen in his shower last week in which Nathaniel Warren had to be called. Shares for it to have been assumed he broke his hip but xrays revealed that to  not have occurred. Shares to be in a lot of pain and to be concerned hand is broken as well as more than pulled muscles in his leg. Shares to feel as if he didn't receive adequate care during last hospital admission and no full investigation into the pain in his leg.  Shares to feel as if this is additional stress and worry on top of other medical concerns he has. Reports mood to be low. Able to process having supports of cousin and roommate and girlfriend. Shares plans to present to emergency room in the morning. Shares to be grateful to get started with medication managing following today's session. Able to process thoughts and feelings with clinician and shares feelings of hope and working to reframe negative thoughts. Identifies x 1 cognitive pattern of negative thinking perpetuating depression. No safety concerns reported.    Suicidal/Homicidal: Nowithout intent/plan  Therapist Response: Therapist engaged Nathaniel Warren in therapy session. Completed check in and assessed for current level of functioning, sxs management  and level of stressors. Assessed for physical safety and pain and need to present to emergency department for care. Provided safe space for Nathaniel Warren to share thoughts and feelings regarding recent fall and presentation to emergency department. Provided support and encouragement; validated Nathaniel Warren feelings  and provided supportive feedback. Engaged Nathaniel Warren and working to engage in balanced thinking and working to identify current physical state to be temporary and need to follow up to ensure appropriate care. Encouraged Nathaniel Warren to utilize natural supports and avoiding isolative behaviors from others. Encouraged ongoing to identify ongoing cognitive patterns. Reviewed session and provided next appointment.   Plan: Return again in x 2 weeks.  Diagnosis: MDD (major depressive disorder), recurrent severe, without psychosis (HCC)  Collaboration of Care: Other None  Patient/Guardian was advised Release of Information must be obtained prior to any record release in order to collaborate their care with an outside provider. Patient/Guardian was advised if they have not already done so to contact the registration department to sign all necessary forms in order for [place] to release information regarding their care.   Consent: Patient/Guardian gives verbal consent for treatment and assignment of benefits for services provided during this visit. Patient/Guardian expressed understanding and agreed to proceed.   [name], [facility] 08/08/2022
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
## 2022-08-08 ENCOUNTER — Emergency Department (HOSPITAL_BASED_OUTPATIENT_CLINIC_OR_DEPARTMENT_OTHER): Admit: 2022-08-08 | Discharge: 2022-08-08 | Disposition: A | Discharge status: Home / Self Care | Payer: Medicaid Other

## 2022-08-08 ENCOUNTER — Emergency Department (HOSPITAL_COMMUNITY)
Admission: EM | Admit: 2022-08-08 | Discharge: 2022-08-08 | Disposition: A | Discharge status: Home / Self Care | Payer: Self-pay | Attending: Emergency Medicine | Admitting: Emergency Medicine

## 2022-08-08 ENCOUNTER — Encounter: Payer: Self-pay | Admitting: Gastroenterology

## 2022-08-08 ENCOUNTER — Other Ambulatory Visit: Payer: Self-pay

## 2022-08-08 ENCOUNTER — Encounter (HOSPITAL_COMMUNITY): Payer: Self-pay

## 2022-08-08 DIAGNOSIS — R52 Pain, unspecified: Secondary | ICD-10-CM

## 2022-08-08 DIAGNOSIS — Z599 Problem related to housing and economic circumstances, unspecified: Secondary | ICD-10-CM | POA: Insufficient documentation

## 2022-08-08 DIAGNOSIS — M7989 Other specified soft tissue disorders: Secondary | ICD-10-CM | POA: Insufficient documentation

## 2022-08-08 DIAGNOSIS — F1090 Alcohol use, unspecified, uncomplicated: Secondary | ICD-10-CM | POA: Insufficient documentation

## 2022-08-08 DIAGNOSIS — K7031 Alcoholic cirrhosis of liver with ascites: Secondary | ICD-10-CM | POA: Insufficient documentation

## 2022-08-08 DIAGNOSIS — Z1211 Encounter for screening for malignant neoplasm of colon: Secondary | ICD-10-CM | POA: Insufficient documentation

## 2022-08-08 DIAGNOSIS — K766 Portal hypertension: Secondary | ICD-10-CM | POA: Insufficient documentation

## 2022-08-08 LAB — BASIC METABOLIC PANEL
Anion gap: 9 (ref 5–15)
BUN: 13 mg/dL (ref 6–20)
CO2: 27 mmol/L (ref 22–32)
Calcium: 9.1 mg/dL (ref 8.9–10.3)
Chloride: 95 mmol/L — ABNORMAL LOW (ref 98–111)
Creatinine, Ser: 0.76 mg/dL (ref 0.61–1.24)
GFR, Estimated: >60 mL/min (ref >60)
Glucose, Bld: 124 mg/dL — ABNORMAL HIGH (ref 70–99)
Potassium: 3.6 mmol/L (ref 3.5–5.1)
Sodium: 131 mmol/L — ABNORMAL LOW (ref 135–145)

## 2022-08-08 LAB — CBC
HCT: 29.1 % — ABNORMAL LOW (ref 39.0–52.0)
Hemoglobin: 10.8 g/dL — ABNORMAL LOW (ref 13.0–17.0)
MCH: 37.5 pg — ABNORMAL HIGH (ref 26.0–34.0)
MCHC: 37.1 g/dL — ABNORMAL HIGH (ref 30.0–36.0)
MCV: 101 fL — ABNORMAL HIGH (ref 80.0–100.0)
Platelets: 82 10*3/uL — ABNORMAL LOW (ref 150–400)
RBC: 2.88 MIL/uL — ABNORMAL LOW (ref 4.22–5.81)
RDW: 17.8 % — ABNORMAL HIGH (ref 11.5–15.5)
WBC: 11.8 10*3/uL — ABNORMAL HIGH (ref 4.0–10.5)
nRBC: 0 % (ref 0.0–0.2)

## 2022-08-08 LAB — PROTIME-INR
INR: 1.5 — ABNORMAL HIGH (ref 0.8–1.2)
Prothrombin Time: 17.7 seconds — ABNORMAL HIGH (ref 11.4–15.2)

## 2022-08-08 NOTE — ED Triage Notes (Signed)
Per EMS-Patient reports that he had a fall in the shower 6 days ago and was seen at [facility] ED for the same. Patient reports that he continues to have swelling in his right hand and has swelling and pain in his right calf.
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
## 2022-08-08 NOTE — Progress Notes (Signed)
Right lower extremity venous duplex has been completed. Preliminary results can be found in CV Proc through chart review.  Results were given to [facility].  08/08/22 10:59 AM Olen Cordial RVT

## 2022-08-08 NOTE — Discharge Instructions (Signed)
Please monitor your leg becomes significantly more swollen or if you develop any of the below symptoms please return to the emergency room.  Cold to touch, numb, white in color compared to left, worsening pain.  As we discussed your platelets are low but this is related to your drinking.  Elevate your leg when you are able to and wear the compression Ace wrap.  I given you the information for an orthopedist to follow-up with.  You may also follow-up with your primary care provider.

## 2022-08-08 NOTE — ED Provider Notes (Signed)
[facility] DEPT Provider Note   CSN: [id number] Arrival date & time: 08/08/22  0854     History  Chief Complaint  Patient presents with   Fall   hand swelling   calf swelling    NEHEMIAH SNARR is a 56 y.o. male.   Fall   Patient is a 56 year old male with past medical history significant for alcoholism and anxiety, electrolyte disorders, seizures, depression  Patient had a fall 8/26 and was seen in the emergency room 8/27 had an x-ray of his right lower extremity and additional work-up however he states that 2 days ago he began experiencing more swelling and a pressure-like sensation in his leg from the swelling.  He states it is uncomfortable but not severely uncomfortable.  He states that primarily the swelling is what prompted him to come back to the emergency room.  No additional trauma or injuries.       Home Medications Prior to Admission medications   Medication Sig Start Date End Date Taking? Authorizing Provider  furosemide (LASIX) 40 MG tablet Take 1 tablet (40 mg total) by mouth daily. 07/18/22   Esterwood, Amy S, PA-C  gabapentin (NEURONTIN) 300 MG capsule Take 1 capsule (300 mg total) by mouth 3 (three) times daily. For withdrawals 08/07/22   Nwoko, Terese Door, PA  PARoxetine (PAXIL) 20 MG tablet Take 1 tablet (20 mg total) by mouth daily. 08/07/22 08/07/23  Nwoko, Isidoro Donning E, PA  potassium chloride SA (KLOR-CON M) 20 MEQ tablet Take 1 tablet (20 mEq total) by mouth 2 (two) times daily for 2 days. Patient not taking: Reported on 08/05/2022 04/28/22 04/30/22  Esterwood, Amy S, PA-C  QUEtiapine (SEROQUEL) 100 MG tablet Take 1 tablet (100 mg total) by mouth at bedtime. For mood control 08/07/22   Nwoko, Terese Door, PA  spironolactone (ALDACTONE) 50 MG tablet Take 1 tablet (50 mg total) by mouth in the morning. 07/18/22   Esterwood, Amy S, PA-C  traZODone (DESYREL) 50 MG tablet Take 1 tablet (50 mg total) by mouth at bedtime. 08/07/22   Nwoko,  Terese Door, PA      Allergies    Asa [aspirin]    Review of Systems   Review of Systems  Physical Exam Updated Vital Signs BP 125/72   Pulse 71   Temp 98.9 F (37.2 C) (Oral)   Resp 18   Ht 5\' 9"  (1.753 m)   Wt 80.3 kg   SpO2 100%   BMI 26.14 kg/m  Physical Exam Vitals and nursing note reviewed.  Constitutional:      General: He is not in acute distress. HENT:     Head: Normocephalic and atraumatic.     Nose: Nose normal.  Eyes:     General: No scleral icterus. Cardiovascular:     Rate and Rhythm: Normal rate and regular rhythm.     Pulses: Normal pulses.     Heart sounds: Normal heart sounds.  Pulmonary:     Effort: Pulmonary effort is normal. No respiratory distress.     Breath sounds: No wheezing.  Abdominal:     Palpations: Abdomen is soft.     Tenderness: There is no abdominal tenderness.  Musculoskeletal:     Cervical back: Normal range of motion.     Right lower leg: Edema present.     Left lower leg: No edema.     Comments: Right lower extremity with 2+ pitting edema to the proximal tibia  Skin:    General: Skin  is warm and dry.     Capillary Refill: Capillary refill takes less than 2 seconds.  Neurological:     Mental Status: He is alert. Mental status is at baseline.  Psychiatric:        Mood and Affect: Mood normal.        Behavior: Behavior normal.     ED Results / Procedures / Treatments   Labs (all labs ordered are listed, but only abnormal results are displayed) Labs Reviewed  BASIC METABOLIC PANEL - Abnormal; Notable for the following components:      Result Value   Sodium 131 (*)    Chloride 95 (*)    Glucose, Bld 124 (*)    All other components within normal limits  CBC - Abnormal; Notable for the following components:   WBC 11.8 (*)    RBC 2.88 (*)    Hemoglobin 10.8 (*)    HCT 29.1 (*)    MCV 101.0 (*)    MCH 37.5 (*)    MCHC 37.1 (*)    RDW 17.8 (*)    Platelets 82 (*)    All other components within normal limits   PROTIME-INR - Abnormal; Notable for the following components:   Prothrombin Time 17.7 (*)    INR 1.5 (*)    All other components within normal limits    EKG None  Radiology No results found.  Procedures Procedures    Medications Ordered in ED Medications - No data to display  ED Course/ Medical Decision Making/ A&P                           Medical Decision Making Amount and/or Complexity of Data Reviewed Labs: ordered.   This patient presents to the ED for concern of leg swelling, this involves a number of treatment options, and is a complaint that carries with it a moderate to high risk of complications and morbidity.  The differential diagnosis includes hematoma, compartment syndrome, soft tissue swelling from trauma.   Co morbidities: Discussed in HPI   Brief History:  Patient is a 56 year old male with past medical history significant for alcoholism and anxiety, electrolyte disorders, seizures, depression  Patient had a fall 8/26 and was seen in the emergency room 8/27 had an x-ray of his right lower extremity and additional work-up however he states that 2 days ago he began experiencing more swelling and a pressure-like sensation in his leg from the swelling.  He states it is uncomfortable but not severely uncomfortable.  He states that primarily the swelling is what prompted him to come back to the emergency room.  No additional trauma or injuries.     EMR reviewed including pt PMHx, past surgical history and past visits to ER.   See HPI for more details   Lab Tests:   I ordered and independently interpreted labs. Labs notable for CBC with hemoglobin of 10.8 which is a 1.5 g loss from 5 days previous.  BMP with no significant changes INR mildly elevated consistent with known liver disease.  Platelets low at 82--significant change from 5 days prior.  Imaging Studies:  NAD. I personally reviewed all imaging studies and no acute abnormality found. I agree  with radiology interpretation.  No DVT.  Cardiac Monitoring:  NA NA   Medicines ordered:     Critical Interventions:     Consults/Attending Physician  I discussed this case with my attending physician who cosigned this note including  patient's presenting symptoms, physical exam, and planned diagnostics and interventions. Attending physician stated agreement with plan or made changes to plan which were implemented.   Attending physician assessed patient at bedside.    Reevaluation:  After the interventions noted above I re-evaluated patient and found that they have :stayed the same not significantly painful   Social Determinants of Health:      Problem List / ED Course:  Leg swelling after ultrasound and review of labs I suspect that this is a hematoma with his hemoglobin drop over short period of time he is likely bled into his leg.  He has faint but palpable he presents distal pulses.  His foot is warm and seems to be well perfused.  I did have my attending physician assessed patient who agrees with my plan to closely follow-up with PCP or orthopedics for recheck.  Very strict return precautions were discussed.   Dispostion:  After consideration of the diagnostic results and the patients response to treatment, I feel that the patent would benefit from outpatient follow-up.  Final Clinical Impression(s) / ED Diagnoses Final diagnoses:  Right leg swelling    Rx / DC Orders ED Discharge Orders     None         Gailen Shelter, [place] 08/13/22 1513    Linwood Dibbles, MD 08/15/22 [phone]

## 2022-08-08 NOTE — ED Notes (Signed)
Patient refuses labs at this time. Will follow up with PA.

## 2022-08-13 ENCOUNTER — Inpatient Hospital Stay: Payer: Self-pay | Admitting: Nurse Practitioner

## 2022-08-20 ENCOUNTER — Ambulatory Visit (HOSPITAL_COMMUNITY): Payer: Self-pay | Admitting: Mental Health

## 2022-09-05 ENCOUNTER — Encounter (HOSPITAL_COMMUNITY): Payer: Self-pay

## 2022-09-10 ENCOUNTER — Ambulatory Visit (INDEPENDENT_AMBULATORY_CARE_PROVIDER_SITE_OTHER): Payer: No Payment, Other | Admitting: Mental Health

## 2022-09-10 DIAGNOSIS — F332 Major depressive disorder, recurrent severe without psychotic features: Secondary | ICD-10-CM

## 2022-09-10 DIAGNOSIS — F411 Generalized anxiety disorder: Secondary | ICD-10-CM

## 2022-09-10 NOTE — Patient Instructions (Incomplete)
Thank you for attending your appointment today.  -- INCREASE trazodone to 100 mg nightly -- START taking Seroquel 25-50 mg twice a day as needed for anxiety/panic attacks in addition to nightly Seroquel -- Continue other medications as prescribed.  Please do not make any changes to medications without first discussing with your provider. If you are experiencing a psychiatric emergency, please call 911 or present to your nearest emergency department. Additional crisis, medication management, and therapy resources are included below.  [facility]  [address], [place], [place] [address] [phone] or [phone] [facility] 24/7 FOR ANYONE [address], [place], [place] Fax: [phone] [email or web address] *Interpreters available *Accepts all insurance and uninsured for Urgent Care needs *Accepts Medicaid and uninsured for outpatient treatment (below)      ONLY FOR [facility]  Below:    Outpatient New Patient Assessment/Therapy Walk-ins:        Monday -Thursday 8am until slots are full.        Every Friday 1pm-4pm  (first come, first served)                   New Patient Psychiatry/Medication Management        Monday-Friday 8am-11am (first come, first served)               For all walk-ins we ask that you arrive by 7:15am, because patients will be seen in the order of arrival.

## 2022-09-10 NOTE — Progress Notes (Signed)
   THERAPIST PROGRESS NOTE  Session Time: 11:00am (53 minutes)   Participation Level: Active  Behavioral Response: CasualAlertDepressed  Type of Therapy: Individual Therapy  Treatment Goals addressed: STG: Lydon WILL IDENTIFY x5  COGNITIVE PATTERNS AND BELIEFS THAT SUPPORT DEPRESSION AND WORK TO REFRAME/RESTRUCTURE THOUGHTS 7/7 DAYS.    ProgressTowards Goals: Not Progressing  Interventions: Supportive  Summary: Nathaniel Warren is a 56 y.o. male who presents with MDD, PTSD and GAD. Presents alert and oriented; mood and affect low, depressed. Tearful at times. Nathaniel Warren shares increase in sxs of depression with increase in isolation, reduced sleep and poor appetite. States to have missed last OPT appointment due to passing of his son. Shares for son to have been released from prison on a Wednesday and for him to have overdosed on opiates that Saturday. Shares thoughts and feelings related to son's passing. States to feel as if he is 'sinking in a whole' and not having ability to work to get out of it. Shares to be declining calls from others with difficulty managing the grief. Shares was able to have increased sleep but since son's passing sleeping no more than x 2 to x 3 hours a night. Shares feelings of being exhausted and desire to sleep. Able to process with clinician states of grief. Engages with clinician to explore ability to follow up with medication management for sleep, work to eat daily and reach out to a natural support daily. Shares to be in agreeance of communicating to others desire to not consistently asked about son and working to have moments of mental relief. Denies SI/HI.    Regression in ability to use coping skills and reframe thoughts with new stressor of son's passing.   Suicidal/Homicidal: Nowithout intent/plan  Therapist Response: Therapist engaged Nathaniel Warren in therapy session. Completed check and assessed for current level of functioning, sxs management and level of stressors.  Provided safe space for Nathaniel Warren to [employer] and shares thoughts and feelings of son's passing. Provided support and encouagement validated feelings. Supported in normalizing feelings and exploring stages of grief. Encouraged working to assess needs and ability to communicate with natural supports his wishes. Encouraged eating daily, increased sleep and engagement in self-care and self-soothing as needed. Reviewed session and provided follow up. Supported in attaining follow up medication management appointment with past provider to have transferred. Assessed for safety.   Plan: Return again in  x 2 weeks.  Diagnosis: MDD (major depressive disorder), recurrent severe, without psychosis ([place])  GAD (generalized anxiety disorder)  Collaboration of Care: Medication Management AEB Supported in connecting to new provider  Patient/Guardian was advised Release of Information must be obtained prior to any record release in order to collaborate their care with an outside provider. Patient/Guardian was advised if they have not already done so to contact the registration department to sign all necessary forms in order for [place] to release information regarding their care.   Consent: Patient/Guardian gives verbal consent for treatment and assignment of benefits for services provided during this visit. Patient/Guardian expressed understanding and agreed to proceed.   Nathaniel Warren, [facility] 09/10/2022
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
## 2022-09-11 ENCOUNTER — Encounter (HOSPITAL_COMMUNITY): Payer: Self-pay | Admitting: Psychiatry

## 2022-09-11 ENCOUNTER — Ambulatory Visit (INDEPENDENT_AMBULATORY_CARE_PROVIDER_SITE_OTHER): Payer: No Payment, Other | Admitting: Psychiatry

## 2022-09-11 DIAGNOSIS — G479 Sleep disorder, unspecified: Secondary | ICD-10-CM

## 2022-09-11 DIAGNOSIS — F411 Generalized anxiety disorder: Secondary | ICD-10-CM

## 2022-09-11 DIAGNOSIS — F4310 Post-traumatic stress disorder, unspecified: Secondary | ICD-10-CM

## 2022-09-11 DIAGNOSIS — F332 Major depressive disorder, recurrent severe without psychotic features: Secondary | ICD-10-CM

## 2022-09-11 DIAGNOSIS — F4321 Adjustment disorder with depressed mood: Secondary | ICD-10-CM

## 2022-09-11 DIAGNOSIS — F1090 Alcohol use, unspecified, uncomplicated: Secondary | ICD-10-CM

## 2022-09-11 MED ORDER — PAROXETINE HCL 20 MG PO TABS: 20.0000 mg | ORAL_TABLET | Freq: Every day | ORAL | 1 refills | Status: DC

## 2022-09-11 MED ORDER — QUETIAPINE FUMARATE 100 MG PO TABS: 100.0000 mg | ORAL_TABLET | Freq: Every day | ORAL | 1 refills | Status: DC

## 2022-09-11 MED ORDER — QUETIAPINE FUMARATE 25 MG PO TABS: ORAL_TABLET | ORAL | 2 refills | Status: DC

## 2022-09-11 MED ORDER — TRAZODONE HCL 100 MG PO TABS: 100.0000 mg | ORAL_TABLET | Freq: Every evening | ORAL | 2 refills | Status: DC

## 2022-09-17 ENCOUNTER — Encounter (HOSPITAL_COMMUNITY): Payer: Self-pay | Admitting: Physician Assistant

## 2022-09-22 ENCOUNTER — Emergency Department (HOSPITAL_COMMUNITY)
Admission: EM | Admit: 2022-09-22 | Discharge: 2022-09-22 | Disposition: A | Discharge status: Home / Self Care | Payer: Self-pay | Attending: Student | Admitting: Student

## 2022-09-22 ENCOUNTER — Emergency Department (HOSPITAL_COMMUNITY): Payer: Self-pay

## 2022-09-22 ENCOUNTER — Encounter (HOSPITAL_COMMUNITY): Payer: Self-pay

## 2022-09-22 ENCOUNTER — Other Ambulatory Visit: Payer: Self-pay

## 2022-09-22 DIAGNOSIS — F17210 Nicotine dependence, cigarettes, uncomplicated: Secondary | ICD-10-CM | POA: Insufficient documentation

## 2022-09-22 DIAGNOSIS — E7220 Disorder of urea cycle metabolism, unspecified: Secondary | ICD-10-CM | POA: Insufficient documentation

## 2022-09-22 DIAGNOSIS — H1589 Other disorders of sclera: Secondary | ICD-10-CM | POA: Insufficient documentation

## 2022-09-22 DIAGNOSIS — K7030 Alcoholic cirrhosis of liver without ascites: Secondary | ICD-10-CM | POA: Insufficient documentation

## 2022-09-22 DIAGNOSIS — Y900 Blood alcohol level of less than 20 mg/100 ml: Secondary | ICD-10-CM | POA: Insufficient documentation

## 2022-09-22 DIAGNOSIS — R748 Abnormal levels of other serum enzymes: Secondary | ICD-10-CM | POA: Insufficient documentation

## 2022-09-22 DIAGNOSIS — R112 Nausea with vomiting, unspecified: Secondary | ICD-10-CM | POA: Insufficient documentation

## 2022-09-22 DIAGNOSIS — X58XXXA Exposure to other specified factors, initial encounter: Secondary | ICD-10-CM | POA: Insufficient documentation

## 2022-09-22 DIAGNOSIS — M7989 Other specified soft tissue disorders: Secondary | ICD-10-CM | POA: Insufficient documentation

## 2022-09-22 DIAGNOSIS — D72829 Elevated white blood cell count, unspecified: Secondary | ICD-10-CM | POA: Insufficient documentation

## 2022-09-22 DIAGNOSIS — S60222A Contusion of left hand, initial encounter: Secondary | ICD-10-CM | POA: Insufficient documentation

## 2022-09-22 DIAGNOSIS — R791 Abnormal coagulation profile: Secondary | ICD-10-CM | POA: Insufficient documentation

## 2022-09-22 LAB — CBC WITH DIFFERENTIAL/PLATELET
Abs Immature Granulocytes: 0.04 10*3/uL (ref 0.00–0.07)
Basophils Absolute: 0.1 10*3/uL (ref 0.0–0.1)
Basophils Relative: 1 %
Eosinophils Absolute: 0.2 10*3/uL (ref 0.0–0.5)
Eosinophils Relative: 2 %
HCT: 37.1 % — ABNORMAL LOW (ref 39.0–52.0)
Hemoglobin: 13.8 g/dL (ref 13.0–17.0)
Immature Granulocytes: 0 %
Lymphocytes Relative: 23 %
Lymphs Abs: 2.6 10*3/uL (ref 0.7–4.0)
MCH: 37.9 pg — ABNORMAL HIGH (ref 26.0–34.0)
MCHC: 37.2 g/dL — ABNORMAL HIGH (ref 30.0–36.0)
MCV: 101.9 fL — ABNORMAL HIGH (ref 80.0–100.0)
Monocytes Absolute: 1.3 10*3/uL — ABNORMAL HIGH (ref 0.1–1.0)
Monocytes Relative: 11 %
Neutro Abs: 6.9 10*3/uL (ref 1.7–7.7)
Neutrophils Relative %: 63 %
Platelets: 70 10*3/uL — ABNORMAL LOW (ref 150–400)
RBC: 3.64 MIL/uL — ABNORMAL LOW (ref 4.22–5.81)
RDW: 17.7 % — ABNORMAL HIGH (ref 11.5–15.5)
WBC: 11.1 10*3/uL — ABNORMAL HIGH (ref 4.0–10.5)
nRBC: 0 % (ref 0.0–0.2)

## 2022-09-22 LAB — COMPREHENSIVE METABOLIC PANEL
ALT: 36 U/L (ref 0–44)
AST: 132 U/L — ABNORMAL HIGH (ref 15–41)
Albumin: 3 g/dL — ABNORMAL LOW (ref 3.5–5.0)
Alkaline Phosphatase: 194 U/L — ABNORMAL HIGH (ref 38–126)
Anion gap: 8 (ref 5–15)
BUN: 9 mg/dL (ref 6–20)
CO2: 23 mmol/L (ref 22–32)
Calcium: 9.1 mg/dL (ref 8.9–10.3)
Chloride: 103 mmol/L (ref 98–111)
Creatinine, Ser: 0.77 mg/dL (ref 0.61–1.24)
GFR, Estimated: >60 mL/min (ref >60)
Glucose, Bld: 130 mg/dL — ABNORMAL HIGH (ref 70–99)
Potassium: 4.2 mmol/L (ref 3.5–5.1)
Sodium: 134 mmol/L — ABNORMAL LOW (ref 135–145)
Total Bilirubin: 6.6 mg/dL — ABNORMAL HIGH (ref 0.3–1.2)
Total Protein: 8.2 g/dL — ABNORMAL HIGH (ref 6.5–8.1)

## 2022-09-22 LAB — PROTIME-INR
INR: 1.6 — ABNORMAL HIGH (ref 0.8–1.2)
Prothrombin Time: 18.5 seconds — ABNORMAL HIGH (ref 11.4–15.2)

## 2022-09-22 LAB — ETHANOL: Alcohol, Ethyl (B): <10 mg/dL (ref <10)

## 2022-09-22 LAB — LIPASE, BLOOD: Lipase: 63 U/L — ABNORMAL HIGH (ref 11–51)

## 2022-09-22 LAB — AMMONIA: Ammonia: 40 umol/L — ABNORMAL HIGH (ref 9–35)

## 2022-09-22 MED ORDER — OXYCODONE HCL 5 MG PO TABS
5.0000 mg | ORAL_TABLET | Freq: Once | ORAL | Status: AC
  Administered 2022-09-22: 5 mg via ORAL
  Filled 2022-09-22: qty 1

## 2022-09-22 MED ORDER — ONDANSETRON 4 MG PO TBDP: 4.0000 mg | ORAL_TABLET | Freq: Three times a day (TID) | ORAL | 0 refills | Status: DC | PRN

## 2022-09-22 MED ORDER — ONDANSETRON 4 MG PO TBDP
4.0000 mg | ORAL_TABLET | Freq: Once | ORAL | Status: AC
  Administered 2022-09-22: 4 mg via ORAL
  Filled 2022-09-22: qty 1

## 2022-09-22 MED ORDER — GABAPENTIN 300 MG PO CAPS
300.0000 mg | ORAL_CAPSULE | Freq: Once | ORAL | Status: AC
Start: 2022-09-22 — End: 2022-09-22
  Administered 2022-09-22: 300 mg via ORAL
  Filled 2022-09-22: qty 1

## 2022-09-22 MED ORDER — OXYCODONE HCL 5 MG PO TABS: 5.0000 mg | ORAL_TABLET | ORAL | 0 refills | Status: DC | PRN

## 2022-09-22 NOTE — ED Provider Notes (Signed)
[facility] DEPT Provider Note  CSN: [id number] Arrival date & time: 09/22/22 0623  Chief Complaint(s) Hand Injury  HPI Nathaniel Warren is a 56 y.o. male with PMH alcoholic cirrhosis, seizures, previous CVA who presents emergency department for evaluation of left hand pain and swelling.  Patient states that for the last 24 hours he has had worsening pain and swelling to the left hand and wrist.  He states that he thinks he bumped it approximately 4 days ago and has been gradually worsening since then.  He states that similar things have happened to his leg and hip over the last few months.  These are usually preceded by trauma.  He states the pain has been so severe that he has had nausea and vomiting.  He denies chest pain, shortness of breath, headache, fever or other systemic symptoms.   Past Medical History Past Medical History:  Diagnosis Date   Depression    Hernia, abdominal    Liver failure ([place])    Seizures ([place])    Stroke [facility])    Patient Active Problem List   Diagnosis Date Noted   Alcohol use disorder 08/08/2022   Ascites due to alcoholic cirrhosis ([place]) 76/28/3151   Portal hypertension ([place]) 08/08/2022   Colon cancer screening 08/08/2022   Financial difficulties 08/08/2022   Posttraumatic stress disorder 07/15/2022   History of seizures 04/03/2022   Liver failure without hepatic coma ([place]) 76/16/0737   Alcoholic cirrhosis of liver with ascites ([place]) 02/24/2022   Anasarca 02/21/2022   Hypokalemia 02/21/2022   Hyponatremia 02/21/2022   Elevated LFTs 02/21/2022   Hyperbilirubinemia 02/21/2022   Elevated brain natriuretic peptide (BNP) level 02/21/2022   Tobacco abuse 02/21/2022   Thrombocytopenia ([place]) 02/21/2022   Normocytic anemia 02/21/2022   Multiple lacunar infarcts ([place]) 10/21/2017   Drug overdose, intentional, initial encounter ([place]) 10/20/2017   Drug overdose, intentional self-harm, initial encounter ([place]) 10/20/2017    Seizures ([place]) 10/20/2017   Drug overdose 10/20/2017   MDD (major depressive disorder), recurrent severe, without psychosis ([place]) 04/10/2013    Class: Acute   GAD (generalized anxiety disorder) 04/10/2013   Anxiety 01/11/2013   Home Medication(s) Prior to Admission medications   Medication Sig Start Date End Date Taking? Authorizing Provider  ondansetron (ZOFRAN-ODT) 4 MG disintegrating tablet Take 1 tablet (4 mg total) by mouth every 8 (eight) hours as needed for nausea or vomiting. 09/22/22  Yes Ithan Touhey, MD  oxyCODONE (ROXICODONE) 5 MG immediate release tablet Take 1 tablet (5 mg total) by mouth every 4 (four) hours as needed for severe pain. 09/22/22  Yes Zriyah Kopplin, MD  furosemide (LASIX) 40 MG tablet Take 1 tablet (40 mg total) by mouth daily. 07/18/22   Esterwood, Amy S, PA-C  gabapentin (NEURONTIN) 300 MG capsule Take 1 capsule (300 mg total) by mouth 3 (three) times daily. For withdrawals 08/07/22   Nwoko, Terese Door, PA  PARoxetine (PAXIL) 20 MG tablet Take 1 tablet (20 mg total) by mouth daily. 09/11/22 11/10/22  Bahraini, Sarah A  potassium chloride SA (KLOR-CON M) 20 MEQ tablet Take 1 tablet (20 mEq total) by mouth 2 (two) times daily for 2 days. Patient not taking: Reported on 08/05/2022 04/28/22 04/30/22  Esterwood, Amy S, PA-C  QUEtiapine (SEROQUEL) 100 MG tablet Take 1 tablet (100 mg total) by mouth at bedtime. 09/11/22   Bahraini, Sarah A  QUEtiapine (SEROQUEL) 25 MG tablet Take 1-2 tablets (25-50 mg) up to twice a day as needed for severe anxiety/panic attacks. 09/11/22  Bahraini, Leary Roca  spironolactone (ALDACTONE) 50 MG tablet Take 1 tablet (50 mg total) by mouth in the morning. 07/18/22   Esterwood, Amy S, PA-C  traZODone (DESYREL) 100 MG tablet Take 1 tablet (100 mg total) by mouth at bedtime. 09/11/22 12/10/22  Alda Berthold                                                                                                                                    Past Surgical  History Past Surgical History:  Procedure Laterality Date   ANKLE SURGERY     ANKLE SURGERY Right    HERNIA REPAIR     SPLENECTOMY     Family History Family History  Problem Relation Age of Onset   COPD Mother    Diabetes Father    Stroke Maternal Grandmother    Alzheimer's disease Maternal Grandfather    Cancer Paternal Grandmother    Stomach cancer Paternal Grandfather    Hypertension Other    Colon cancer Neg Hx    Esophageal cancer Neg Hx    Colon polyps Neg Hx    Inflammatory bowel disease Neg Hx    Liver disease Neg Hx    Pancreatic cancer Neg Hx    Rectal cancer Neg Hx     Social History Social History   Tobacco Use   Smoking status: Every Day    Packs/day: 1.00    Years: 30.00    Total pack years: 30.00    Types: Cigarettes   Smokeless tobacco: Former  Scientific laboratory technician Use: Never used  Substance Use Topics   Alcohol use: Yes   Drug use: Yes    Types: Marijuana   Allergies Asa [aspirin]  Review of Systems Review of Systems  Musculoskeletal:  Positive for arthralgias.  Hematological:  Bruises/bleeds easily.    Physical Exam Vital Signs  I have reviewed the triage vital signs BP (!) 143/80   Pulse 74   Temp 98.8 F (37.1 C)   Resp 18   Ht 5' 9"  (1.753 m)   Wt 82.1 kg   SpO2 98%   BMI 26.73 kg/m   Physical Exam Constitutional:      General: He is not in acute distress.    Appearance: Normal appearance.  HENT:     Head: Normocephalic and atraumatic.     Nose: No congestion or rhinorrhea.  Eyes:     General: Scleral icterus present.        Right eye: No discharge.        Left eye: No discharge.     Extraocular Movements: Extraocular movements intact.     Pupils: Pupils are equal, round, and reactive to light.  Cardiovascular:     Rate and Rhythm: Normal rate and regular rhythm.     Heart sounds: No murmur heard. Pulmonary:     Effort: No respiratory distress.     Breath sounds:  No wheezing or rales.  Abdominal:     General:  There is no distension.     Tenderness: There is no abdominal tenderness.  Musculoskeletal:        General: Swelling and tenderness present. Normal range of motion.     Cervical back: Normal range of motion.  Skin:    General: Skin is warm and dry.  Neurological:     General: No focal deficit present.     Mental Status: He is alert.     ED Results and Treatments Labs (all labs ordered are listed, but only abnormal results are displayed) Labs Reviewed  CBC WITH DIFFERENTIAL/PLATELET - Abnormal; Notable for the following components:      Result Value   WBC 11.1 (*)    RBC 3.64 (*)    HCT 37.1 (*)    MCV 101.9 (*)    MCH 37.9 (*)    MCHC 37.2 (*)    RDW 17.7 (*)    Platelets 70 (*)    Monocytes Absolute 1.3 (*)    All other components within normal limits  COMPREHENSIVE METABOLIC PANEL - Abnormal; Notable for the following components:   Sodium 134 (*)    Glucose, Bld 130 (*)    Total Protein 8.2 (*)    Albumin 3.0 (*)    AST 132 (*)    Alkaline Phosphatase 194 (*)    Total Bilirubin 6.6 (*)    All other components within normal limits  PROTIME-INR - Abnormal; Notable for the following components:   Prothrombin Time 18.5 (*)    INR 1.6 (*)    All other components within normal limits  AMMONIA - Abnormal; Notable for the following components:   Ammonia 40 (*)    All other components within normal limits  LIPASE, BLOOD - Abnormal; Notable for the following components:   Lipase 63 (*)    All other components within normal limits  ETHANOL                                                                                                                          Radiology DG Wrist Complete Left  Result Date: 09/22/2022 CLINICAL DATA:  Trauma, pain EXAM: LEFT WRIST - COMPLETE 3+ VIEW COMPARISON:  None Available. FINDINGS: No fracture or dislocation is seen. The soft tissue swelling around the wrist. There are no opaque foreign bodies. IMPRESSION: No fracture or dislocation is  seen in left wrist. Electronically Signed   By: Elmer Picker M.D.   On: 09/22/2022 10:17   DG Hand Complete Left  Result Date: 09/22/2022 CLINICAL DATA:  Trauma, pain and swelling EXAM: LEFT HAND - COMPLETE 3+ VIEW COMPARISON:  None Available. FINDINGS: No recent displaced fracture or dislocation is seen. There is 3 mm smooth marginated calcification in the soft tissues adjacent to the distal phalanx of the ring finger. There is soft tissue deformity at the tip of left ring finger with possible laceration. There is soft tissue swelling  over the dorsum. No opaque foreign bodies are seen. IMPRESSION: No recent fracture or dislocation is seen in left hand. 3 mm smooth marginated calcification in the soft tissues adjacent to the distal phalanx of left ring finger may be residual from previous injury. There is soft tissue deformity at the tip of left ring finger suggesting possible skin laceration. Electronically Signed   By: Elmer Picker M.D.   On: 09/22/2022 10:16    Pertinent labs & imaging results that were available during my care of the patient were reviewed by me and considered in my medical decision making (see MDM for details).  Medications Ordered in ED Medications  oxyCODONE (Oxy IR/ROXICODONE) immediate release tablet 5 mg (5 mg Oral Given 09/22/22 1021)  ondansetron (ZOFRAN-ODT) disintegrating tablet 4 mg (4 mg Oral Given 09/22/22 1022)  gabapentin (NEURONTIN) capsule 300 mg (300 mg Oral Given 09/22/22 1322)                                                                                                                                     Procedures Procedures  (including critical care time)  Medical Decision Making / ED Course   This patient presents to the ED for concern of hand pain and swelling, this involves an extensive number of treatment options, and is a complaint that carries with it a high risk of complications and morbidity.  The differential diagnosis  includes hematoma, septic joint, cellulitis, fracture, ligamentous injury  MDM: Patient seen emergency room for evaluation of hand pain and swelling.  Physical exam with significant swelling to the left hand, bruising at the left wrist but no significant swelling up in the forearm.  There is also scleral icterus on exam but no asterixis.  X-ray imaging is negative.  Laboratory evaluation with a slightly increased INR to 1.6 up from 1.5.  Ammonia is elevated at 40 but the patient has no encephalopathy or asterixis.  Lipase minimally elevated at 63 likely secondary to his persistent alcohol use.  His total bilirubin has increased significantly to 6.6 but his alk phos has remained the same.  AST is 132 again likely secondary to his persistent alcohol use.  CBC with a leukocytosis to 11.1, hemoglobin 13.8, platelet count of 70.  Patient is functionally anticoagulated secondary to his liver disease and alcohol use and patient's presentation is likely consistent with a contusion with associated hematoma similar to his 2 other previous presentations.  I have overall low suspicion for septic joint given the patient is able to range the hand without significant difficulty and the patient swelling is primarily distal to the bruising on the dorsum of the hand consistent with gravity dependent edema.  Patient was wrapped in an Ace bandage and symptoms were controlled with oxycodone and Zofran.  I consulted the patient's gastroenterologist and spoke with the PA on-call Andree Coss who will help arrange GI follow-up for his worsening bilirubin.  Patient given  resources on hematoma formation and rice therapy.  Patient then discharged with outpatient follow-up and return precautions.   Additional history obtained:  -External records from outside source obtained and reviewed including: Chart review including previous notes, labs, imaging, consultation notes   Lab Tests: -I ordered, reviewed, and interpreted labs.   The  pertinent results include:   Labs Reviewed  CBC WITH DIFFERENTIAL/PLATELET - Abnormal; Notable for the following components:      Result Value   WBC 11.1 (*)    RBC 3.64 (*)    HCT 37.1 (*)    MCV 101.9 (*)    MCH 37.9 (*)    MCHC 37.2 (*)    RDW 17.7 (*)    Platelets 70 (*)    Monocytes Absolute 1.3 (*)    All other components within normal limits  COMPREHENSIVE METABOLIC PANEL - Abnormal; Notable for the following components:   Sodium 134 (*)    Glucose, Bld 130 (*)    Total Protein 8.2 (*)    Albumin 3.0 (*)    AST 132 (*)    Alkaline Phosphatase 194 (*)    Total Bilirubin 6.6 (*)    All other components within normal limits  PROTIME-INR - Abnormal; Notable for the following components:   Prothrombin Time 18.5 (*)    INR 1.6 (*)    All other components within normal limits  AMMONIA - Abnormal; Notable for the following components:   Ammonia 40 (*)    All other components within normal limits  LIPASE, BLOOD - Abnormal; Notable for the following components:   Lipase 63 (*)    All other components within normal limits  ETHANOL       Imaging Studies ordered: I ordered imaging studies including x-ray wrist and hand I independently visualized and interpreted imaging. I agree with the radiologist interpretation   Medicines ordered and prescription drug management: Meds ordered this encounter  Medications   oxyCODONE (Oxy IR/ROXICODONE) immediate release tablet 5 mg   ondansetron (ZOFRAN-ODT) disintegrating tablet 4 mg   gabapentin (NEURONTIN) capsule 300 mg   ondansetron (ZOFRAN-ODT) 4 MG disintegrating tablet    Sig: Take 1 tablet (4 mg total) by mouth every 8 (eight) hours as needed for nausea or vomiting.    Dispense:  20 tablet    Refill:  0   oxyCODONE (ROXICODONE) 5 MG immediate release tablet    Sig: Take 1 tablet (5 mg total) by mouth every 4 (four) hours as needed for severe pain.    Dispense:  10 tablet    Refill:  0    -I have reviewed the patients  home medicines and have made adjustments as needed  Critical interventions none  Consultations Obtained: I requested consultation with the [place] gastroenterology PA on-call Ranae Pila,  and discussed lab and imaging findings as well as pertinent plan - they recommend: Outpatient follow-up   Cardiac Monitoring: The patient was maintained on a cardiac monitor.  I personally viewed and interpreted the cardiac monitored which showed an underlying rhythm of: NSR  Social Determinants of Health:  Factors impacting patients care include: Still currently drinking 1 mixed drink at night, 2 ounces of whiskey   Reevaluation: After the interventions noted above, I reevaluated the patient and found that they have :improved  Co morbidities that complicate the patient evaluation  Past Medical History:  Diagnosis Date   Depression    Hernia, abdominal    Liver failure ([place])    Seizures ([place])    Stroke (  [place])       Dispostion: I considered admission for this patient, but the patient does not meet inpatient criteria for admission is safe for discharge and outpatient fu     Final Clinical Impression(s) / ED Diagnoses Final diagnoses:  Contusion of left hand, initial encounter  Alcoholic cirrhosis of liver without ascites [facility])     @PCDICTATION @    Teressa Lower, MD 09/22/22 2139

## 2022-09-22 NOTE — ED Triage Notes (Signed)
Patient reports that he thinks he "bumped "his left hand on something. Patient has swelling to the hand and bruising at the wrist area. Patient also c/o nausea/vomiting.  Patient also has a small laceration above the left wrist and states he had that 2-3 days prior to bumping his left hand.  Left radial pulse heard with the doppler.

## 2022-09-22 NOTE — ED Provider Triage Note (Signed)
Emergency Medicine Provider Triage EvaluationNote  [name] , a 56 y.o. male  was evaluated in triage.  Pt complains of  LEFT HAND PAIN AND SWELLING.  HX cirrohsis and etoh abuse- + new kitten at home.  Left hand dominant. C/o n/v x 2 days. C/o left hadn swelling and pain x 24 hours- bumped it 4 days ago and had no issues until last night. C/o severe pain.  Review of Systems  Positive: N/v hand pain  Negative:  For numbness  Physical Exam  BP (!) 149/89 (BP Location: Right Arm)   Pulse 96   Temp 98.9 F (37.2 C) (Oral)   Resp 20   Ht 5\' 9"  (1.753 m)   Wt 82.1 kg   SpO2 99%   BMI 26.73 kg/m  Gen:   Awake, no distress   Resp:  Normal effort  MSK:   Moves extremities without difficulty  Other:  Jaundice. Tremor in hands, Left hand markedly swollen and bruised.     Medical Decision Making  Medically screening exam initiated at 9:51 AM.  Appropriate orders placed.  [name] was informed that the remainder of the evaluation will be completed by another provider, this initial triage assessment does not replace that evaluation, and the importance of remaining in the ED until their evaluation is complete.  Patient would not allow me to remove a bandaid on his ring finger.   Margarita Mail, PA-C 09/22/22 1001

## 2022-09-29 ENCOUNTER — Ambulatory Visit (INDEPENDENT_AMBULATORY_CARE_PROVIDER_SITE_OTHER): Payer: No Payment, Other | Admitting: Mental Health

## 2022-09-29 DIAGNOSIS — F332 Major depressive disorder, recurrent severe without psychotic features: Secondary | ICD-10-CM

## 2022-09-29 DIAGNOSIS — F411 Generalized anxiety disorder: Secondary | ICD-10-CM

## 2022-09-29 DIAGNOSIS — F4310 Post-traumatic stress disorder, unspecified: Secondary | ICD-10-CM
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
## 2022-09-29 NOTE — Progress Notes (Signed)
   THERAPIST PROGRESS NOTE  Session Time: 1:00pm (56 minutes)  Participation Level: Active  Behavioral Response: CasualAlertDysphoric  Type of Therapy: Individual Therapy  Treatment Goals addressed: STG: [place] WILL IDENTIFY x5  COGNITIVE PATTERNS AND BELIEFS THAT SUPPORT DEPRESSION AND WORK TO REFRAME/RESTRUCTURE THOUGHTS 7/7 DAYS.     ProgressTowards Goals: Progressing  Interventions: CBT and Supportive  Summary:  TENOCH MCCLURE is a 56 y.o. male who presents with MDD, PTSD and GAD. Presents alert and oriented; mood and affect adequate; appropriate to situation. Speech clear and coherent at normal rate and tone. Engaged and receptive to interventions. Brodan shares with therapist recent ED visit due to hitting hand and presents with hand noticeably swollen. States during ED Visit to have also became aware father was in ICU who also has health concerns. Shares has worked to not isolate and has been engaging with others and reports for this to be helpful. Shares thoughts of not having time to grief. Shares to have x 2 voicemail recordings of son. Engaged in education on stages of grief and reviewed grief; ball and box education on presence of grielf. Shares increase anxiety related to phone calls fromfamily members or friends of family members with catastrophizing thoughts of something to be wrong with family when they call. Able to process presence of grief/trauma response and working to rationally process throught this anxiety at the time of call. Shares thoughts of feeling guilty upon engaging in events in which he enjoys. Shares will work to engage in Animator thinking process during times of calls and working to allow self to [employer] and grief as needed. Shares upcoming grief specific counseling at hospice and possibility of group. No safety concerns reported.   Suicidal/Homicidal: Nowithout intent/plan  Therapist Response: Therapist engaged [place]rell in therapy session. Completed check and  assessed for current level of functioning, sxs management and level of stressors. Supported and encouragement; validated feelings. Supported Jeneen Rinks in processing thoughts related to recent ED visit. Engaged in processing thoughts and feelings surrounding processing feelings of grief and working through stages of grief. Educated on stages of grief and processed stages of grief not being linear or finite. Provided safe space for Rece to share thoughts of son and giving self time to grief; normalized feelings. Engaged in grief, ball and box activitiy and processed. Engaged Norvil in working to process increase in anxiety with phone calls from family and presence of over generalizing and catastrophizing thinking patterns present. Engaged Eliab in restructuring thoughts and encouraged Won to engage in deep breathing and processing thinking of likelihood of call to be of unfortunate news of loved one. Reviewed session and provided follow up appointment.   Plan: Return again in  x2 weeks.  Diagnosis: Posttraumatic stress disorder  MDD (major depressive disorder), recurrent severe, without psychosis ([place])  GAD (generalized anxiety disorder)  Collaboration of Care: Other [facility]  Patient/Guardian was advised Release of Information must be obtained prior to any record release in order to collaborate their care with an outside provider. Patient/Guardian was advised if they have not already done so to contact the registration department to sign all necessary forms in order for [place] to release information regarding their care.   Consent: Patient/Guardian gives verbal consent for treatment and assignment of benefits for services provided during this visit. Patient/Guardian expressed understanding and agreed to proceed.   Rockey [address] [place], [facility] 09/29/2022

## 2022-10-14 ENCOUNTER — Ambulatory Visit (HOSPITAL_COMMUNITY): Payer: Self-pay | Admitting: Mental Health

## 2022-10-14 ENCOUNTER — Ambulatory Visit: Payer: Self-pay | Admitting: Gastroenterology

## 2022-10-23 ENCOUNTER — Encounter (HOSPITAL_COMMUNITY): Payer: No Payment, Other | Admitting: Psychiatry

## 2022-10-28 ENCOUNTER — Encounter (HOSPITAL_COMMUNITY): Payer: Self-pay | Admitting: Psychiatry
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
## 2022-10-28 NOTE — Patient Instructions (Incomplete)
Thank you for attending your appointment today.  -- *** -- Continue other medications as prescribed.  Please do not make any changes to medications without first discussing with your provider. If you are experiencing a psychiatric emergency, please call 911 or present to your nearest emergency department. Additional crisis, medication management, and therapy resources are included below.  [facility]  [address], [place], [place] [address] [phone] WALK-IN URGENT CARE 24/7 FOR ANYONE [address], [place],   [phone] Fax: [phone] [email or web address] *Interpreters available *Accepts all insurance and uninsured for Urgent Care needs *Accepts Medicaid and uninsured for outpatient treatment (below)      ONLY FOR [place] Residents  Below:    Outpatient New Patient Assessment/Therapy Walk-ins:        Monday -Thursday 8am until slots are full.        Every Friday 1pm-4pm  (first come, first served)                   New Patient Psychiatry/Medication Management        Monday-Friday 8am-11am (first come, first served)               For all walk-ins we ask that you arrive by 7:15am, because patients will be seen in the order of arrival.   

## 2022-11-06 ENCOUNTER — Telehealth (HOSPITAL_COMMUNITY): Payer: Self-pay | Admitting: Mental Health

## 2022-11-25 ENCOUNTER — Ambulatory Visit (HOSPITAL_COMMUNITY): Payer: Self-pay | Admitting: Mental Health

## 2022-11-25 NOTE — Telephone Encounter (Signed)
Contact pt to reschedule appointment

## 2022-12-01 ENCOUNTER — Inpatient Hospital Stay (HOSPITAL_COMMUNITY)
Admission: EM | Admit: 2022-12-01 | Discharge: 2022-12-06 | DRG: 604 | Disposition: A | Discharge status: Home / Self Care | Payer: Medicaid Other | Attending: Family Medicine | Admitting: Family Medicine

## 2022-12-01 ENCOUNTER — Emergency Department (HOSPITAL_COMMUNITY): Payer: Medicaid Other

## 2022-12-01 DIAGNOSIS — Z597 Insufficient social insurance and welfare support: Secondary | ICD-10-CM

## 2022-12-01 DIAGNOSIS — F32A Depression, unspecified: Secondary | ICD-10-CM | POA: Diagnosis present

## 2022-12-01 DIAGNOSIS — M79604 Pain in right leg: Secondary | ICD-10-CM | POA: Diagnosis present

## 2022-12-01 DIAGNOSIS — S20219A Contusion of unspecified front wall of thorax, initial encounter: Secondary | ICD-10-CM | POA: Diagnosis present

## 2022-12-01 DIAGNOSIS — D689 Coagulation defect, unspecified: Secondary | ICD-10-CM | POA: Diagnosis present

## 2022-12-01 DIAGNOSIS — R296 Repeated falls: Secondary | ICD-10-CM | POA: Diagnosis present

## 2022-12-01 DIAGNOSIS — Z79899 Other long term (current) drug therapy: Secondary | ICD-10-CM

## 2022-12-01 DIAGNOSIS — M21371 Foot drop, right foot: Secondary | ICD-10-CM | POA: Insufficient documentation

## 2022-12-01 DIAGNOSIS — D696 Thrombocytopenia, unspecified: Secondary | ICD-10-CM | POA: Diagnosis present

## 2022-12-01 DIAGNOSIS — D62 Acute posthemorrhagic anemia: Secondary | ICD-10-CM | POA: Diagnosis present

## 2022-12-01 DIAGNOSIS — D649 Anemia, unspecified: Secondary | ICD-10-CM

## 2022-12-01 DIAGNOSIS — R053 Chronic cough: Secondary | ICD-10-CM | POA: Diagnosis present

## 2022-12-01 DIAGNOSIS — Z1152 Encounter for screening for COVID-19: Secondary | ICD-10-CM

## 2022-12-01 DIAGNOSIS — S20211A Contusion of right front wall of thorax, initial encounter: Principal | ICD-10-CM | POA: Diagnosis present

## 2022-12-01 DIAGNOSIS — Z886 Allergy status to analgesic agent status: Secondary | ICD-10-CM

## 2022-12-01 DIAGNOSIS — M25571 Pain in right ankle and joints of right foot: Secondary | ICD-10-CM | POA: Diagnosis present

## 2022-12-01 DIAGNOSIS — R0902 Hypoxemia: Secondary | ICD-10-CM | POA: Insufficient documentation

## 2022-12-01 DIAGNOSIS — G8929 Other chronic pain: Secondary | ICD-10-CM | POA: Diagnosis present

## 2022-12-01 DIAGNOSIS — S20212A Contusion of left front wall of thorax, initial encounter: Secondary | ICD-10-CM

## 2022-12-01 DIAGNOSIS — R569 Unspecified convulsions: Secondary | ICD-10-CM | POA: Diagnosis present

## 2022-12-01 DIAGNOSIS — K828 Other specified diseases of gallbladder: Secondary | ICD-10-CM | POA: Diagnosis present

## 2022-12-01 DIAGNOSIS — W19XXXA Unspecified fall, initial encounter: Secondary | ICD-10-CM | POA: Diagnosis present

## 2022-12-01 DIAGNOSIS — G589 Mononeuropathy, unspecified: Secondary | ICD-10-CM | POA: Diagnosis present

## 2022-12-01 DIAGNOSIS — R918 Other nonspecific abnormal finding of lung field: Secondary | ICD-10-CM | POA: Insufficient documentation

## 2022-12-01 DIAGNOSIS — R42 Dizziness and giddiness: Secondary | ICD-10-CM | POA: Diagnosis present

## 2022-12-01 DIAGNOSIS — F1010 Alcohol abuse, uncomplicated: Secondary | ICD-10-CM | POA: Diagnosis present

## 2022-12-01 DIAGNOSIS — Y9301 Activity, walking, marching and hiking: Secondary | ICD-10-CM | POA: Diagnosis present

## 2022-12-01 DIAGNOSIS — K5790 Diverticulosis of intestine, part unspecified, without perforation or abscess without bleeding: Secondary | ICD-10-CM | POA: Diagnosis present

## 2022-12-01 DIAGNOSIS — K859 Acute pancreatitis without necrosis or infection, unspecified: Secondary | ICD-10-CM | POA: Diagnosis present

## 2022-12-01 DIAGNOSIS — K766 Portal hypertension: Secondary | ICD-10-CM | POA: Diagnosis present

## 2022-12-01 DIAGNOSIS — M79671 Pain in right foot: Secondary | ICD-10-CM | POA: Diagnosis present

## 2022-12-01 DIAGNOSIS — Z833 Family history of diabetes mellitus: Secondary | ICD-10-CM

## 2022-12-01 DIAGNOSIS — M79601 Pain in right arm: Secondary | ICD-10-CM | POA: Diagnosis present

## 2022-12-01 DIAGNOSIS — Z820 Family history of epilepsy and other diseases of the nervous system: Secondary | ICD-10-CM

## 2022-12-01 DIAGNOSIS — Z823 Family history of stroke: Secondary | ICD-10-CM

## 2022-12-01 DIAGNOSIS — Z9081 Acquired absence of spleen: Secondary | ICD-10-CM

## 2022-12-01 DIAGNOSIS — K8020 Calculus of gallbladder without cholecystitis without obstruction: Secondary | ICD-10-CM | POA: Diagnosis present

## 2022-12-01 DIAGNOSIS — F1090 Alcohol use, unspecified, uncomplicated: Secondary | ICD-10-CM | POA: Diagnosis present

## 2022-12-01 DIAGNOSIS — K7030 Alcoholic cirrhosis of liver without ascites: Secondary | ICD-10-CM | POA: Diagnosis present

## 2022-12-01 DIAGNOSIS — Z800 Family history of malignant neoplasm of digestive organs: Secondary | ICD-10-CM

## 2022-12-01 DIAGNOSIS — K7031 Alcoholic cirrhosis of liver with ascites: Secondary | ICD-10-CM | POA: Diagnosis present

## 2022-12-01 DIAGNOSIS — J189 Pneumonia, unspecified organism: Secondary | ICD-10-CM | POA: Diagnosis present

## 2022-12-01 DIAGNOSIS — Z8249 Family history of ischemic heart disease and other diseases of the circulatory system: Secondary | ICD-10-CM

## 2022-12-01 DIAGNOSIS — I2510 Atherosclerotic heart disease of native coronary artery without angina pectoris: Secondary | ICD-10-CM | POA: Diagnosis present

## 2022-12-01 DIAGNOSIS — N182 Chronic kidney disease, stage 2 (mild): Secondary | ICD-10-CM | POA: Diagnosis present

## 2022-12-01 DIAGNOSIS — Z8673 Personal history of transient ischemic attack (TIA), and cerebral infarction without residual deficits: Secondary | ICD-10-CM

## 2022-12-01 DIAGNOSIS — K7210 Chronic hepatic failure without coma: Secondary | ICD-10-CM | POA: Diagnosis present

## 2022-12-01 DIAGNOSIS — T148XXA Other injury of unspecified body region, initial encounter: Secondary | ICD-10-CM | POA: Insufficient documentation

## 2022-12-01 DIAGNOSIS — E871 Hypo-osmolality and hyponatremia: Secondary | ICD-10-CM | POA: Diagnosis present

## 2022-12-01 DIAGNOSIS — F17210 Nicotine dependence, cigarettes, uncomplicated: Secondary | ICD-10-CM | POA: Diagnosis present

## 2022-12-01 DIAGNOSIS — Z825 Family history of asthma and other chronic lower respiratory diseases: Secondary | ICD-10-CM

## 2022-12-01 LAB — ETHANOL: Alcohol, Ethyl (B): <10 mg/dL (ref <10)

## 2022-12-01 LAB — CBC WITH DIFFERENTIAL/PLATELET
Abs Immature Granulocytes: 0.18 10*3/uL — ABNORMAL HIGH (ref 0.00–0.07)
Basophils Absolute: 0 10*3/uL (ref 0.0–0.1)
Basophils Relative: 0 %
Eosinophils Absolute: 0.1 10*3/uL (ref 0.0–0.5)
Eosinophils Relative: 0 %
HCT: 11.7 % — ABNORMAL LOW (ref 39.0–52.0)
Hemoglobin: 4.2 g/dL — CL (ref 13.0–17.0)
Immature Granulocytes: 1 %
Lymphocytes Relative: 25 %
Lymphs Abs: 4.4 10*3/uL — ABNORMAL HIGH (ref 0.7–4.0)
MCH: 38.5 pg — ABNORMAL HIGH (ref 26.0–34.0)
MCHC: 35.9 g/dL (ref 30.0–36.0)
MCV: 107.3 fL — ABNORMAL HIGH (ref 80.0–100.0)
Monocytes Absolute: 2.5 10*3/uL — ABNORMAL HIGH (ref 0.1–1.0)
Monocytes Relative: 14 %
Neutro Abs: 10.8 10*3/uL — ABNORMAL HIGH (ref 1.7–7.7)
Neutrophils Relative %: 60 %
Platelets: 144 10*3/uL — ABNORMAL LOW (ref 150–400)
RBC: 1.09 MIL/uL — ABNORMAL LOW (ref 4.22–5.81)
RDW: 18 % — ABNORMAL HIGH (ref 11.5–15.5)
WBC: 18 10*3/uL — ABNORMAL HIGH (ref 4.0–10.5)
nRBC: 0.4 % — ABNORMAL HIGH (ref 0.0–0.2)

## 2022-12-01 LAB — COMPREHENSIVE METABOLIC PANEL
ALT: 35 U/L (ref 0–44)
AST: 94 U/L — ABNORMAL HIGH (ref 15–41)
Albumin: 2.5 g/dL — ABNORMAL LOW (ref 3.5–5.0)
Alkaline Phosphatase: 110 U/L (ref 38–126)
Anion gap: 11 (ref 5–15)
BUN: 20 mg/dL (ref 6–20)
CO2: 24 mmol/L (ref 22–32)
Calcium: 8.7 mg/dL — ABNORMAL LOW (ref 8.9–10.3)
Chloride: 95 mmol/L — ABNORMAL LOW (ref 98–111)
Creatinine, Ser: 1.26 mg/dL — ABNORMAL HIGH (ref 0.61–1.24)
GFR, Estimated: >60 mL/min (ref >60)
Glucose, Bld: 110 mg/dL — ABNORMAL HIGH (ref 70–99)
Potassium: 4 mmol/L (ref 3.5–5.1)
Sodium: 130 mmol/L — ABNORMAL LOW (ref 135–145)
Total Bilirubin: 9.9 mg/dL — ABNORMAL HIGH (ref 0.3–1.2)
Total Protein: 6.4 g/dL — ABNORMAL LOW (ref 6.5–8.1)

## 2022-12-01 LAB — PROTIME-INR
INR: 2.1 — ABNORMAL HIGH (ref 0.8–1.2)
Prothrombin Time: 23.2 seconds — ABNORMAL HIGH (ref 11.4–15.2)

## 2022-12-01 LAB — HEMOGLOBIN AND HEMATOCRIT, BLOOD
HCT: 16.3 % — ABNORMAL LOW (ref 39.0–52.0)
Hemoglobin: 5.9 g/dL — CL (ref 13.0–17.0)

## 2022-12-01 LAB — PREPARE RBC (CROSSMATCH)

## 2022-12-01 LAB — LIPASE, BLOOD: Lipase: 75 U/L — ABNORMAL HIGH (ref 11–51)

## 2022-12-01 MED ORDER — ONDANSETRON HCL 4 MG/2ML IJ SOLN
4.0000 mg | Freq: Once | INTRAMUSCULAR | Status: AC
  Administered 2022-12-01: 4 mg via INTRAVENOUS
  Filled 2022-12-01: qty 2

## 2022-12-01 MED ORDER — SODIUM CHLORIDE 0.9% IV SOLUTION: Freq: Once | INTRAVENOUS | Status: AC

## 2022-12-01 MED ORDER — IOHEXOL 350 MG/ML SOLN
75.0000 mL | Freq: Once | INTRAVENOUS | Status: AC | PRN
  Administered 2022-12-01: 75 mL via INTRAVENOUS

## 2022-12-01 MED ORDER — SODIUM CHLORIDE 0.9 % IV BOLUS
1000.0000 mL | Freq: Once | INTRAVENOUS | Status: AC
  Administered 2022-12-01: 1000 mL via INTRAVENOUS

## 2022-12-01 MED ORDER — MORPHINE SULFATE (PF) 4 MG/ML IV SOLN
4.0000 mg | Freq: Once | INTRAVENOUS | Status: AC
  Administered 2022-12-01: 4 mg via INTRAVENOUS
  Filled 2022-12-01: qty 1

## 2022-12-01 NOTE — ED Triage Notes (Signed)
Arrives via EMS from home with R arm pain, claims he heard a pop when using his crutches. Patient has bruising to R neck and chest area. No thinners, [name].

## 2022-12-01 NOTE — ED Provider Notes (Signed)
[facility] Provider Note   CSN: [id number] Arrival date & time: 12/01/22  1811     History {Add pertinent medical, surgical, social history, OB history to HPI:1} Chief Complaint  Patient presents with   Arm Pain    Right arm   Bleeding/Bruising    Right neck to chest,     Nathaniel Warren is a 56 y.o. male.  Pt is a 56 yo male with a pmhx significant for alcohol abuse, cva, seizures, depression, and cirrhosis.  Pt said he's had ankle pain for months.  He's been walking with crutches.  He has not seen ortho in f/u because he does not have insurance.  Pt said he was walking on his crutches yesterday and felt a pop with severe pain in his right chest.  He has noticed a lot of bruising.  Pt denies falling.         Home Medications Prior to Admission medications   Medication Sig Start Date End Date Taking? Authorizing Provider  furosemide (LASIX) 40 MG tablet Take 1 tablet (40 mg total) by mouth daily. 07/18/22   Esterwood, Amy S, PA-C  gabapentin (NEURONTIN) 300 MG capsule Take 1 capsule (300 mg total) by mouth 3 (three) times daily. For withdrawals 08/07/22   Nwoko, Isidoro Donning E, PA  ondansetron (ZOFRAN-ODT) 4 MG disintegrating tablet Take 1 tablet (4 mg total) by mouth every 8 (eight) hours as needed for nausea or vomiting. 09/22/22   Kommor, Madison, MD  oxyCODONE (ROXICODONE) 5 MG immediate release tablet Take 1 tablet (5 mg total) by mouth every 4 (four) hours as needed for severe pain. 09/22/22   Kommor, Madison, MD  PARoxetine (PAXIL) 20 MG tablet Take 1 tablet (20 mg total) by mouth daily. 09/11/22 11/10/22  Bahraini, Sarah A  potassium chloride SA (KLOR-CON M) 20 MEQ tablet Take 1 tablet (20 mEq total) by mouth 2 (two) times daily for 2 days. Patient not taking: Reported on 08/05/2022 04/28/22 04/30/22  Esterwood, Amy S, PA-C  QUEtiapine (SEROQUEL) 100 MG tablet Take 1 tablet (100 mg total) by mouth at bedtime. 09/11/22   Bahraini, Sarah A   QUEtiapine (SEROQUEL) 25 MG tablet Take 1-2 tablets (25-50 mg) up to twice a day as needed for severe anxiety/panic attacks. 09/11/22   Bahraini, Sarah A  spironolactone (ALDACTONE) 50 MG tablet Take 1 tablet (50 mg total) by mouth in the morning. 07/18/22   Esterwood, Amy S, PA-C  traZODone (DESYREL) 100 MG tablet Take 1 tablet (100 mg total) by mouth at bedtime. 09/11/22 12/10/22  Bahraini, Sarah A      Allergies    Asa [aspirin]    Review of Systems   Review of Systems  Musculoskeletal:        Right sided chest pain  All other systems reviewed and are negative.   Physical Exam Updated Vital Signs BP (!) 95/57   Pulse 88   Resp 17   SpO2 92%  Physical Exam Vitals and nursing note reviewed.  Constitutional:      Appearance: Normal appearance.  HENT:     Head: Normocephalic and atraumatic.     Right Ear: External ear normal.     Left Ear: External ear normal.     Nose: Nose normal.     Mouth/Throat:     Mouth: Mucous membranes are moist.     Pharynx: Oropharynx is clear.  Eyes:     Extraocular Movements: Extraocular movements intact.     Conjunctiva/sclera: Conjunctivae  normal.     Pupils: Pupils are equal, round, and reactive to light.  Cardiovascular:     Rate and Rhythm: Normal rate and regular rhythm.     Pulses: Normal pulses.     Heart sounds: Normal heart sounds.  Pulmonary:     Effort: Pulmonary effort is normal.     Breath sounds: Normal breath sounds.  Chest:       Comments: Chest wall with significant bruising Abdominal:     General: Abdomen is flat. Bowel sounds are normal.     Palpations: Abdomen is soft.  Musculoskeletal:        General: Normal range of motion.     Cervical back: Normal range of motion and neck supple.  Skin:    Capillary Refill: Capillary refill takes less than 2 seconds.     Coloration: Skin is jaundiced.  Neurological:     General: No focal deficit present.     Mental Status: He is alert and oriented to person, place, and time.   Psychiatric:        Mood and Affect: Mood normal.        Behavior: Behavior normal.     ED Results / Procedures / Treatments   Labs (all labs ordered are listed, but only abnormal results are displayed) Labs Reviewed  COMPREHENSIVE METABOLIC PANEL - Abnormal; Notable for the following components:      Result Value   Sodium 130 (*)    Chloride 95 (*)    Glucose, Bld 110 (*)    Creatinine, Ser 1.26 (*)    Calcium 8.7 (*)    Total Protein 6.4 (*)    Albumin 2.5 (*)    AST 94 (*)    Total Bilirubin 9.9 (*)    All other components within normal limits  CBC WITH DIFFERENTIAL/PLATELET - Abnormal; Notable for the following components:   WBC 18.0 (*)    RBC 1.09 (*)    Hemoglobin 4.2 (*)    HCT 11.7 (*)    MCV 107.3 (*)    MCH 38.5 (*)    RDW 18.0 (*)    Platelets 144 (*)    nRBC 0.4 (*)    Neutro Abs 10.8 (*)    Lymphs Abs 4.4 (*)    Monocytes Absolute 2.5 (*)    Abs Immature Granulocytes 0.18 (*)    All other components within normal limits  LIPASE, BLOOD - Abnormal; Notable for the following components:   Lipase 75 (*)    All other components within normal limits  HEMOGLOBIN AND HEMATOCRIT, BLOOD - Abnormal; Notable for the following components:   Hemoglobin 5.9 (*)    HCT 16.3 (*)    All other components within normal limits  PROTIME-INR - Abnormal; Notable for the following components:   Prothrombin Time 23.2 (*)    INR 2.1 (*)    All other components within normal limits  ETHANOL  URINALYSIS, ROUTINE W REFLEX MICROSCOPIC  TYPE AND SCREEN  PREPARE RBC (CROSSMATCH)    EKG None  Radiology No results found.  Procedures Procedures  {Document cardiac monitor, telemetry assessment procedure when appropriate:1}  Medications Ordered in ED Medications  0.9 %  sodium chloride infusion (Manually program via Guardrails IV Fluids) (has no administration in time range)  sodium chloride 0.9 % bolus 1,000 mL (1,000 mLs Intravenous New Bag/Given 12/01/22 2124)   morphine (PF) 4 MG/ML injection 4 mg (4 mg Intravenous Given 12/01/22 2123)  ondansetron (ZOFRAN) injection 4 mg (4 mg Intravenous  Given 12/01/22 2123)  iohexol (OMNIPAQUE) 350 MG/ML injection 75 mL (75 mLs Intravenous Contrast Given 12/01/22 2327)    ED Course/ Medical Decision Making/ A&P                           Medical Decision Making Amount and/or Complexity of Data Reviewed Labs: ordered. Radiology: ordered.  Risk Prescription drug management.   This patient presents to the ED for concern of chest wall pain, this involves an extensive number of treatment options, and is a complaint that carries with it a high risk of complications and morbidity.  The differential diagnosis includes fx, contusion, blood abn   Co morbidities that complicate the patient evaluation  alcohol abuse, cva, seizures, depression, and cirrhosis   Additional history obtained:  Additional history obtained from epic chart review External records from outside source obtained and reviewed including EMS report   Lab Tests:  I Ordered, and personally interpreted labs.  The pertinent results include:  cbc with wbc elevated at 18, hgb 4.2 (repeat was 5.9), plt 144; cmp with na 130, cr 1.26, and tb 9.9   Imaging Studies ordered:  I ordered imaging studies including ct chest/abd/pelvis  I independently visualized and interpreted imaging which showed *** I agree with the radiologist interpretation   Cardiac Monitoring:  The patient was maintained on a cardiac monitor.  I personally viewed and interpreted the cardiac monitored which showed an underlying rhythm of: nsr   Medicines ordered and prescription drug management:  I ordered medication including morphine/zofran  for pain  Reevaluation of the patient after these medicines showed that the patient improved I have reviewed the patients home medicines and have made adjustments as needed   Test Considered:  ct   Critical  Interventions:  transfusion   Consultations Obtained:  I requested consultation with the ***,  and discussed lab and imaging findings as well as pertinent plan - they recommend: ***   Problem List / ED Course:  Liver failure: Pt has seen Dr. Rush Landmark from [place].  He is not a candidate for transplant due to continued etoh abuse.   Reevaluation:  After the interventions noted above, I reevaluated the patient and found that they have :improved   Social Determinants of Health:  Lives at home   Dispostion:  After consideration of the diagnostic results and the patients response to treatment, I feel that the patent would benefit from admission.    CRITICAL CARE Performed by: Isla Pence   Total critical care time: 30 minutes  Critical care time was exclusive of separately billable procedures and treating other patients.  Critical care was necessary to treat or prevent imminent or life-threatening deterioration.  Critical care was time spent personally by me on the following activities: development of treatment plan with patient and/or surrogate as well as nursing, discussions with consultants, evaluation of patient's response to treatment, examination of patient, obtaining history from patient or surrogate, ordering and performing treatments and interventions, ordering and review of laboratory studies, ordering and review of radiographic studies, pulse oximetry and re-evaluation of patient's condition.   {Document critical care time when appropriate:1} {Document review of labs and clinical decision tools ie heart score, Chads2Vasc2 etc:1}  {Document your independent review of radiology images, and any outside records:1} {Document your discussion with family members, caretakers, and with consultants:1} {Document social determinants of health affecting pt's care:1} {Document your decision making why or why not admission, treatments were needed:1} Final Clinical  Impression(s) / ED Diagnoses Final diagnoses:  Hematoma of right chest wall, initial encounter  Chronic liver failure without hepatic coma ([place])    Rx / DC Orders ED Discharge Orders     None

## 2022-12-01 NOTE — ED Notes (Signed)
Reported Hgb 4.2 to Dr. Particia Nearing

## 2022-12-02 ENCOUNTER — Observation Stay (HOSPITAL_COMMUNITY): Payer: Medicaid Other

## 2022-12-02 ENCOUNTER — Emergency Department (HOSPITAL_COMMUNITY): Payer: Medicaid Other

## 2022-12-02 ENCOUNTER — Encounter (HOSPITAL_COMMUNITY): Payer: Self-pay | Admitting: *Deleted

## 2022-12-02 ENCOUNTER — Other Ambulatory Visit: Payer: Self-pay

## 2022-12-02 DIAGNOSIS — S20212A Contusion of left front wall of thorax, initial encounter: Secondary | ICD-10-CM

## 2022-12-02 DIAGNOSIS — S20219A Contusion of unspecified front wall of thorax, initial encounter: Secondary | ICD-10-CM | POA: Diagnosis present

## 2022-12-02 DIAGNOSIS — D62 Acute posthemorrhagic anemia: Secondary | ICD-10-CM | POA: Diagnosis not present

## 2022-12-02 DIAGNOSIS — G8929 Other chronic pain: Secondary | ICD-10-CM

## 2022-12-02 DIAGNOSIS — K7031 Alcoholic cirrhosis of liver with ascites: Secondary | ICD-10-CM

## 2022-12-02 DIAGNOSIS — M25571 Pain in right ankle and joints of right foot: Secondary | ICD-10-CM

## 2022-12-02 DIAGNOSIS — S20211A Contusion of right front wall of thorax, initial encounter: Principal | ICD-10-CM

## 2022-12-02 DIAGNOSIS — F1090 Alcohol use, unspecified, uncomplicated: Secondary | ICD-10-CM | POA: Diagnosis not present

## 2022-12-02 LAB — HEMOGLOBIN AND HEMATOCRIT, BLOOD
HCT: 25 % — ABNORMAL LOW (ref 39.0–52.0)
HCT: 24.3 % — ABNORMAL LOW (ref 39.0–52.0)
HCT: 23.5 % — ABNORMAL LOW (ref 39.0–52.0)
Hemoglobin: 9 g/dL — ABNORMAL LOW (ref 13.0–17.0)
Hemoglobin: 9 g/dL — ABNORMAL LOW (ref 13.0–17.0)
Hemoglobin: 8.6 g/dL — ABNORMAL LOW (ref 13.0–17.0)

## 2022-12-02 LAB — COMPREHENSIVE METABOLIC PANEL
ALT: 33 U/L (ref 0–44)
AST: 88 U/L — ABNORMAL HIGH (ref 15–41)
Albumin: 2.5 g/dL — ABNORMAL LOW (ref 3.5–5.0)
Alkaline Phosphatase: 101 U/L (ref 38–126)
Anion gap: 8 (ref 5–15)
BUN: 17 mg/dL (ref 6–20)
CO2: 25 mmol/L (ref 22–32)
Calcium: 8.5 mg/dL — ABNORMAL LOW (ref 8.9–10.3)
Chloride: 100 mmol/L (ref 98–111)
Creatinine, Ser: 1.31 mg/dL — ABNORMAL HIGH (ref 0.61–1.24)
GFR, Estimated: >60 mL/min (ref >60)
Glucose, Bld: 130 mg/dL — ABNORMAL HIGH (ref 70–99)
Potassium: 3.7 mmol/L (ref 3.5–5.1)
Sodium: 133 mmol/L — ABNORMAL LOW (ref 135–145)
Total Bilirubin: 9.9 mg/dL — ABNORMAL HIGH (ref 0.3–1.2)
Total Protein: 6.2 g/dL — ABNORMAL LOW (ref 6.5–8.1)

## 2022-12-02 LAB — FIBRINOGEN: Fibrinogen: 150 mg/dL — ABNORMAL LOW (ref 210–475)

## 2022-12-02 LAB — URINALYSIS, ROUTINE W REFLEX MICROSCOPIC
Bilirubin Urine: NEGATIVE
Glucose, UA: NEGATIVE mg/dL
Hgb urine dipstick: NEGATIVE
Ketones, ur: NEGATIVE mg/dL
Leukocytes,Ua: NEGATIVE
Nitrite: NEGATIVE
Protein, ur: NEGATIVE mg/dL
Specific Gravity, Urine: 1.014 (ref 1.005–1.030)
pH: 5 (ref 5.0–8.0)

## 2022-12-02 LAB — TECHNOLOGIST SMEAR REVIEW

## 2022-12-02 LAB — VITAMIN B12: Vitamin B-12: 3366 pg/mL — ABNORMAL HIGH (ref 180–914)

## 2022-12-02 MED ORDER — ADULT MULTIVITAMIN W/MINERALS CH
1.0000 | ORAL_TABLET | Freq: Every day | ORAL | Status: DC
  Administered 2022-12-02 – 2022-12-06 (×5): 1 via ORAL
  Filled 2022-12-02 (×5): qty 1

## 2022-12-02 MED ORDER — QUETIAPINE FUMARATE 100 MG PO TABS
100.0000 mg | ORAL_TABLET | Freq: Every day | ORAL | Status: DC
  Administered 2022-12-02 – 2022-12-05 (×4): 100 mg via ORAL
  Filled 2022-12-02 (×4): qty 1

## 2022-12-02 MED ORDER — THIAMINE MONONITRATE 100 MG PO TABS
100.0000 mg | ORAL_TABLET | Freq: Every day | ORAL | Status: DC
  Administered 2022-12-02 – 2022-12-06 (×5): 100 mg via ORAL
  Filled 2022-12-02 (×5): qty 1

## 2022-12-02 MED ORDER — THIAMINE HCL 100 MG/ML IJ SOLN
100.0000 mg | Freq: Every day | INTRAMUSCULAR | Status: DC
  Filled 2022-12-02: qty 2

## 2022-12-02 MED ORDER — PAROXETINE HCL 20 MG PO TABS
20.0000 mg | ORAL_TABLET | Freq: Every day | ORAL | Status: DC
  Administered 2022-12-02 – 2022-12-06 (×5): 20 mg via ORAL
  Filled 2022-12-02 (×6): qty 1

## 2022-12-02 MED ORDER — HYDROMORPHONE HCL 1 MG/ML IJ SOLN
0.5000 mg | Freq: Once | INTRAMUSCULAR | Status: AC
  Administered 2022-12-02: 0.5 mg via INTRAVENOUS
  Filled 2022-12-02: qty 1

## 2022-12-02 MED ORDER — ONDANSETRON HCL 4 MG/2ML IJ SOLN: 4.0000 mg | Freq: Four times a day (QID) | INTRAMUSCULAR | Status: DC | PRN

## 2022-12-02 MED ORDER — TRAZODONE HCL 50 MG PO TABS
100.0000 mg | ORAL_TABLET | Freq: Every day | ORAL | Status: DC
  Administered 2022-12-02 – 2022-12-05 (×4): 100 mg via ORAL
  Filled 2022-12-02 (×4): qty 2

## 2022-12-02 MED ORDER — LORAZEPAM 1 MG PO TABS: 1.0000 mg | ORAL_TABLET | ORAL | Status: AC | PRN

## 2022-12-02 MED ORDER — LORAZEPAM 2 MG/ML IJ SOLN: 1.0000 mg | INTRAMUSCULAR | Status: AC | PRN

## 2022-12-02 MED ORDER — ONDANSETRON HCL 4 MG PO TABS: 4.0000 mg | ORAL_TABLET | Freq: Four times a day (QID) | ORAL | Status: DC | PRN

## 2022-12-02 MED ORDER — ONDANSETRON HCL 4 MG/2ML IJ SOLN
4.0000 mg | Freq: Once | INTRAMUSCULAR | Status: AC
  Administered 2022-12-02: 4 mg via INTRAVENOUS
  Filled 2022-12-02: qty 2

## 2022-12-02 MED ORDER — HYDROMORPHONE HCL 1 MG/ML IJ SOLN: 1.0000 mg | Freq: Once | INTRAMUSCULAR | Status: DC

## 2022-12-02 MED ORDER — GABAPENTIN 300 MG PO CAPS
300.0000 mg | ORAL_CAPSULE | Freq: Three times a day (TID) | ORAL | Status: DC
  Administered 2022-12-02 – 2022-12-06 (×13): 300 mg via ORAL
  Filled 2022-12-02 (×13): qty 1

## 2022-12-02 MED ORDER — FOLIC ACID 1 MG PO TABS
1.0000 mg | ORAL_TABLET | Freq: Every day | ORAL | Status: DC
  Administered 2022-12-02 – 2022-12-06 (×5): 1 mg via ORAL
  Filled 2022-12-02 (×5): qty 1

## 2022-12-02 NOTE — Progress Notes (Signed)
PROGRESS NOTE    Nathaniel Warren   ZOX:[id number] DOB: [birth year]  DOA: 12/01/2022 Date of Service: 12/02/22 PCP: Ivonne Andrew, NP     Brief Narrative / Hospital Course:  Nathaniel Warren is a 56 y.o. male with medical history significant of EtOH abuse/dependence, cirrhosis. [name], ankle, [name], [name], [name], no fall, no impact trauma, [name], [name]  12/25-12/26: in ED, initial Hgb 4.2, improved d/p 2 unit PRBC to 9.0. Trauma surgery reviewed CT - no surgical intervention needed. Hypotensive, home lasix/spiro held. Doubt DIC, no active bleeding no s/s thrombosis, low fibrinogen, will obtain path smear review. Pt reports persistent pain slightly worse across area of hematoma, reports persistent dizziness. Will get PT eval. MRI as below possibly some radiculopathy/neurogenic claudication but can follow outpatient unless substantial fall risk in which case will ask neurosurgery to eval.   Consultants:  Trauma surgery   Procedures: none      ASSESSMENT & PLAN:   Principal Problem:   Chest wall hematoma Active Problems:   ABLA (acute blood loss anemia)   Alcoholic cirrhosis of liver with ascites (HCC)   Alcohol use disorder   Chronic pain of right ankle  Chest wall hematoma ABLA (acute blood loss anemia) Secondary to chest wall hematoma in patient coagulopathic d/t liver disease  Apparent spontaneous bleed while trying to use crutches.   Large hematoma causing ABLA. Platelets 144. INR 2.1, unclear if this represents true bleeding risk increase though given cirrhosis. EDP called trauma surgery --> Dr Bedelia Person recs  no surgical intervention, use chest binder  No active extravasation on CT --> reimage prn worsening mass/pain  2u PRBC transfusion  H/H Q6H following transfusion - Hgb improved to 9.0 and stable Tele monitor. Checking fibrinogen --> low at 150, path smear review pending but doubt DIC   Alcoholic cirrhosis of liver with ascites (HCC) Holding home lasix and aldactone secondary to hypotension following ABLA. May need to resume these if he becomes volume overloaded.  Alcohol use disorder CIWA for withdrawal prevention.  Chronic pain of right ankle/foot Weakness R leg Pt reports multiple falls  Chronic pain in RLE from knee down, onset following a fall a few months back, loss of sensation in ankle and foot.  Also back pain. Likely radiculopathic / neurogenic. X ray of R ankle --> prior fixation findings, no acute X ray of R knee --> no concerns  MR L spine --> L5-S1 R disc herniation, compression/displacement of the right S1 nerve. 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 PT to see Consider neurosurgical evaluation but probably can f/u outpatient  Concern for fall risk given coagulopathy     DVT prophylaxis: SCD, no Rx Ppx d/t bleed/coagulopathy Pertinent IV fluids/nutrition: no continuous IV fluids  Central lines / invasive devices: none   Code Status: FULL CODE  Disposition: inpatient  TOC needs: none at this time Barriers to discharge /  significant pending items: monitoring hematoma / HH, if remains stable may be able to d/c tomorrow              Subjective:  Patient reports pain in chest/shoulder area of hematoma Denies CP/SOB unless feeling catching when takes a deep breath .  Reports persistent RLE weakness/numbness Reports dizziness and concern for safety w/ ambulation Tolerating diet.   Reports no concerns w/ urination/defecation.   Family Communication: none at this time     Objective Findings:  Vitals:   12/02/22 0700 12/02/22 0821 12/02/22 1227 12/02/22 1333  BP: 92/61 103/60 101/68 108/64  Pulse: 64 73 82 70  Resp: 13 17 18    Temp:  98.2 F (36.8 C) 98.2 F (36.8 C) 98.2 F (36.8 C)  TempSrc:  Oral Oral Oral  SpO2: 96% 97% 93% 96%  Weight:      Height:        Intake/Output Summary (Last 24 hours) at 12/02/2022 1508 Last data filed at 12/02/2022 1253 Gross per 24 hour  Intake 870 ml  Output --  Net 870 ml   Filed Weights   12/02/22 0126  Weight: 79.4 kg    Examination:  Physical Exam Constitutional:      General: He is not in acute distress. HENT:     Head: Normocephalic and atraumatic.  Eyes:     Extraocular Movements: Extraocular movements intact.  Cardiovascular:     Rate and Rhythm: Normal rate and regular rhythm.  Pulmonary:     Effort: Pulmonary effort is normal. No respiratory distress.     Breath sounds: Normal breath sounds.  Abdominal:     General: There is no distension.     Tenderness: There is no abdominal tenderness.  Musculoskeletal:     Cervical back: Normal range of motion.  Skin:    General: Skin is warm and dry.     Coloration: Skin is jaundiced (slight).     Comments: Substantial ecchymosis across R chest into R axillary region, R shoulder   Neurological:     Mental Status: He is alert.           Scheduled Medications:   folic acid  1 mg Oral Daily   gabapentin  300 mg Oral TID   multivitamin with minerals  1 tablet Oral Daily   PARoxetine  20 mg Oral Daily   QUEtiapine  100 mg Oral QHS   thiamine  100 mg Oral Daily   Or   thiamine  100 mg Intravenous Daily   traZODone  100 mg Oral QHS    Continuous Infusions:   PRN Medications:  LORazepam **OR** LORazepam, ondansetron **OR** ondansetron (ZOFRAN) IV  Antimicrobials:  Anti-infectives (From admission, onward)    None            Data Reviewed: I have personally reviewed following labs and imaging studies  CBC: Recent Labs  Lab 12/01/22 2116 12/01/22 2214 12/02/22 0703 12/02/22 1327  WBC 18.0*  --   --   --   NEUTROABS 10.8*  --   --   --   HGB 4.2* 5.9* 9.0* 9.0*  HCT 11.7* 16.3* 25.0* 24.3*  MCV 107.3*  --   --   --   PLT 144*  --   --   --    Basic Metabolic Panel: Recent Labs  Lab 12/01/22 2116 12/02/22 0703  NA 130* 133*  K 4.0 3.7  CL 95* 100  CO2 24 25  GLUCOSE 110* 130*  BUN 20  17  CREATININE 1.26* 1.31*  CALCIUM 8.7* 8.5*   GFR: Estimated Creatinine Clearance: 64.5 mL/min (A) (by C-G formula based on SCr of 1.31 mg/dL (H)). Liver Function Tests: Recent Labs  Lab 12/01/22 2116 12/02/22 0703  AST 94* 88*  ALT 35 33  ALKPHOS 110 101  BILITOT 9.9* 9.9*  PROT 6.4* 6.2*  ALBUMIN 2.5* 2.5*   Recent Labs  Lab 12/01/22 2116  LIPASE 75*   No results for input(s): "AMMONIA" in the last 168 hours. Coagulation Profile: Recent Labs  Lab 12/01/22 2214  INR 2.1*   Cardiac Enzymes: No results for input(s): "CKTOTAL", "CKMB", "CKMBINDEX", "TROPONINI" in the last 168 hours. BNP (last 3 results) No results for input(s): "PROBNP" in the last 8760 hours. HbA1C: No results for input(s): "HGBA1C" in the last 72 hours. CBG: No results for input(s): "GLUCAP" in the last 168 hours. Lipid Profile: No results for input(s): "CHOL", "HDL", "LDLCALC", "TRIG", "CHOLHDL", "LDLDIRECT" in the last 72 hours. Thyroid Function Tests: No results for input(s): "TSH", "T4TOTAL", "FREET4", "T3FREE", "THYROIDAB" in the last 72 hours. Anemia Panel: Recent Labs    12/02/22 0703  VITAMINB12 3,366*   Most Recent Urinalysis On File:     Component Value Date/Time   COLORURINE AMBER (A) 12/01/2022 0151   APPEARANCEUR CLEAR 12/01/2022 0151   LABSPEC 1.014 12/01/2022 0151   PHURINE 5.0 12/01/2022 0151   GLUCOSEU NEGATIVE 12/01/2022 0151   HGBUR NEGATIVE 12/01/2022 0151   BILIRUBINUR  NEGATIVE 12/01/2022 0151   KETONESUR NEGATIVE 12/01/2022 0151   PROTEINUR NEGATIVE 12/01/2022 0151   UROBILINOGEN 0.2 06/07/2013 1905   NITRITE NEGATIVE 12/01/2022 0151   LEUKOCYTESUR NEGATIVE 12/01/2022 0151   Sepsis Labs: @LABRCNTIP (procalcitonin:4,lacticidven:4)  No results found for this or any previous visit (from the past 240 hour(s)).       Radiology Studies: DG Knee Complete 4 Views Right  Result Date: 12/02/2022 CLINICAL DATA:  Knee pain EXAM: RIGHT KNEE - COMPLETE 4+ VIEW COMPARISON:  Radiographs 08/03/2022 FINDINGS: No acute fracture or dislocation.  No knee joint effusion. IMPRESSION: No acute fracture. Electronically Signed   By: 08/05/2022 M.D.   On: 12/02/2022 03:20   DG Ankle Complete Right  Result Date: 12/02/2022 CLINICAL DATA:  Chronic right ankle pain. EXAM: RIGHT ANKLE - COMPLETE 3+ VIEW COMPARISON:  Right foot radiograph dated 04/12/2011. FINDINGS: Prior internal fixation of the medial malleolus and distal fibula. The hardware is intact. There is no acute fracture or dislocation. The bones are osteopenic. The soft tissues are unremarkable. IMPRESSION: 1. No acute findings. 2. Prior internal fixation of the medial malleolus and distal fibula. Electronically Signed   By: 06/12/2011 M.D.   On: 12/02/2022 03:20   CT CHEST ABDOMEN PELVIS W CONTRAST  Result Date: 12/02/2022 CLINICAL DATA:  Blunt polytrauma, with bruising in the right neck and chest. [name]. States he heard a pop when using his crutches. EXAM: CT CHEST, ABDOMEN, AND PELVIS WITH CONTRAST TECHNIQUE: Multidetector CT imaging of the chest, abdomen and pelvis was performed following the standard protocol during bolus administration of intravenous contrast. RADIATION DOSE REDUCTION: This exam was performed according to the departmental dose-optimization program which includes automated exposure control, adjustment of the mA and/or kV according to patient size and/or use of iterative reconstruction  technique. CONTRAST:  19mL OMNIPAQUE IOHEXOL 350 MG/ML SOLN COMPARISON:  CTA chest 08/18/2014, CT abdomen and pelvis with contrast 03/20/2022, CT abdomen and pelvis no contrast 02/21/2022. FINDINGS: CT CHEST FINDINGS Cardiovascular: The cardiac size is normal. There is three-vessel  coronary artery calcification heaviest in the LAD. There is no pericardial effusion. The pulmonary veins are decompressed. The pulmonary trunk there is prominent at 3.5 cm indicating arterial hypertension, but no more dilated than previously. The pulmonary arteries are centrally clear. There is mild aortic patchy atherosclerosis. There is no aortic aneurysm, stenosis or dissection. The great vessels are patent with trace calcifications in the brachiocephalic trunk and proximal left common carotid artery. Mediastinum/Nodes: No enlarged mediastinal, hilar, or axillary lymph nodes. The lower poles of the thyroid gland, the trachea, and esophagus demonstrate no significant findings. The bilateral main bronchi are patent. Lungs/Pleura: Lung bases show posterior atelectasis. There are ground-glass opacities in the medial lingula, in the infrahilar right lower lobe medially, and in the infrahilar right middle lobe medially. In a trauma setting this could be due to pulmonary contusions but given location this is more likely due to multifocal pneumonitis. A follow-up study is recommended 3-4 weeks to ensure clearing. There is no pleural effusion, thickening or pneumothorax. Musculoskeletal: There is moderate subcutaneous chest wall stranding anteriorly and on the right. Bilateral moderate gynecomastia has developed since 2015 and appears to be the diffuse fibroglandular type. There is a heterogeneous mass in the mid to lateral right pectoralis major muscle measuring 9.6 x 4.8 x 6.6 cm. If this recently developed, it is almost certainly a hematoma. If this has been present prior to the antecedent trauma the patient presented from, other etiologies  such as a mass/neoplasm could not be excluded. There are healed fracture deformities of multiple right ribs. There are mild chronic upper plate anterior wedge compression fractures of the T4-6 vertebrae, osteopenia and degenerative change of the spine. The ribcage is intact. No primarily pathologic process of bone is seen. CT ABDOMEN PELVIS FINDINGS Hepatobiliary: There is a nodular hepatic contour consistent with cirrhosis. The main portal vein is patent and within normal caliber limits. No hepatic mass is seen. There are small stones in the distal gallbladder and dilatation of the gallbladder up to 12 cm in length. No wall thickening, pericholecystic edema or biliary dilatation is seen. Pancreas: No abnormality. Spleen: Prior splenectomy with subphrenic splenules largest is again 4.6 cm. No mass. Adrenals/Urinary Tract: There is no adrenal mass. There is no renal cortical mass. There is no urinary stone or obstruction. The bladder is nondistended although could demonstrate mild general wall thickening. Stomach/Bowel: The stomach is contracted but grossly normal. No small bowel obstruction or inflammation is seen. The appendix is normal. There is no dilatation or wall thickening in the large bowel. There are sigmoid diverticula without evidence of acute diverticulitis. Vascular/Lymphatic: There are small venous varices in the lower paraesophageal area, perigastric area and perirectal spaces, some in the upper abdominal mesentery. The main portal vein is within normal caliber limits. There is moderate aortoiliac calcific plaque without aneurysm. No adenopathy is seen. There are mild mesenteric congestive features. Reproductive: Normal prostate. Other: Supraumbilical midline anterior wall fat hernia. There is no incarcerated hernia. There is trace presacral ascites. The earlier studies from this year demonstrated much greater ascites displacing the bowel medially. There is trace perihepatic ascites. Musculoskeletal:  There is asymmetric stranding in the lateral right abdominal wall continuing from the lower lateral right chest. There is degenerative disc disease, spondylosis and facet hypertrophy in the lumbar spine, mild hip DJD. Slight lumbar levoscoliosis. No acute or other significant osseous findings. Osteopenia. IMPRESSION: 1. 9.6 x 4.8 x 6.6 cm heterogeneous mass in the mid to lateral right pectoralis major muscle. If this developed  with the recent trauma, it is almost certainly a hematoma. If this has been present prior to the antecedent trauma the patient presented from, other etiologies such as a mass/neoplasm could not be excluded. 2. There is moderate subcutaneous stranding in the anterior chest wall and right lateral chest and abdominal wall. 3. Ground-glass opacities in the medial lingula, infrahilar right lower lobe, and infrahilar right middle lobe. In a trauma setting it is possible this could be due to multifocal contusions, but given location, multifocal pneumonitis is more likely. A follow-up study is recommended 3-4 weeks to ensure clearing. 4. Prominent pulmonary trunk but no change since 2015. 5. Aortic and coronary artery atherosclerosis. 6. Cirrhotic liver with small venous varices and trace perihepatic and presacral ascites. 7. Distended gallbladder with cholelithiasis but no wall thickening or pericholecystic edema. 8. Possible cystitis versus bladder nondistention. 9. Diverticulosis without evidence of diverticulitis. 10. Supraumbilical midline anterior wall fat hernia. No incarcerated hernia. Electronically Signed   By: Almira Bar M.D.   On: 12/02/2022 00:09            LOS: 0 days       Sunnie Nielsen, DO [facility] 12/02/2022, 3:08 PM    Dictation software may have been used to generate the above note. Typos may occur and escape review in typed/dictated notes. Please contact Dr Lyn Hollingshead directly for clarity if needed.  Staff may message me via secure chat in  Epic  but this may not receive an immediate response,  please page me for urgent matters!  If 7PM-7AM, please contact night coverage [email or web address]

## 2022-12-02 NOTE — ED Notes (Signed)
Pt expressing concerns of difficulty ambulating with use of crutches since falling 2 months ago. Reports numbness to r foot causing increased difficulty ambulating.Pt asking for concerns to be addressed during this stay

## 2022-12-02 NOTE — ED Notes (Signed)
MRI called. They will perform MRI once the patients has completed the blood transfusion.

## 2022-12-02 NOTE — H&P (Signed)
History and Physical    Patient: Nathaniel Warren BPZ:[id number] DOB: Jan 15, 1966 DOA: 12/01/2022 DOS: the patient was seen and examined on 12/02/2022 PCP: Ivonne Andrew, NP  Patient coming from: Home  Chief Complaint:  Chief Complaint  Patient presents with   Arm Pain    Right arm   Bleeding/Bruising    Right neck to chest,    HPI: Nathaniel Warren is a 56 y.o. male with medical history significant of EtOH abuse, cirrhosis.  [name], ankle, and back.  Loss of sensation in ankle and foot.  Symptoms onset after a mechanical fall several months ago, [name], X rays at the time all negative.  Couldn't follow up with ortho due to cost.  On sat / sun felt a pop when using crutches, no fall, no impact trauma, etc.  Yesterday when he woke up, [name]  [name].   Review of Systems: As mentioned in the history of present illness. All other systems reviewed and are negative. Past Medical History:  Diagnosis Date   Depression    Hernia, abdominal    Liver failure (HCC)    Seizures (HCC)    Stroke [facility])    Past Surgical History:  Procedure Laterality Date   ANKLE SURGERY     ANKLE SURGERY Right    HERNIA REPAIR     SPLENECTOMY     Social History:  reports that he has been smoking cigarettes. He has a 30.00 pack-year smoking history. He has quit using smokeless tobacco. He reports current alcohol use. He reports current drug use. Drug: Marijuana.  No Known Allergies  Family History  Problem Relation Age of Onset   COPD Mother    Diabetes Father    Stroke Maternal Grandmother    Alzheimer's disease Maternal Grandfather    Cancer Paternal Grandmother    Stomach cancer Paternal Grandfather    Hypertension Other    Colon cancer Neg Hx    Esophageal cancer Neg Hx    Colon polyps Neg Hx    Inflammatory bowel disease Neg Hx    Liver disease Neg Hx    Pancreatic cancer Neg Hx    Rectal  cancer Neg Hx     Prior to Admission medications   Medication Sig Start Date End Date Taking? Authorizing Provider  furosemide (LASIX) 40 MG tablet Take 1 tablet (40 mg total) by mouth daily. 07/18/22  Yes Esterwood, Amy S, PA-C  gabapentin (NEURONTIN) 300 MG capsule Take 1 capsule (300 mg total) by mouth 3 (three) times daily. For withdrawals 08/07/22  Yes Nwoko, Uchenna E, PA  ondansetron (ZOFRAN-ODT) 4 MG disintegrating tablet Take 1 tablet (4 mg total) by mouth every 8 (eight) hours as needed for nausea or vomiting. 09/22/22  Yes Kommor, Madison, MD  PARoxetine (PAXIL) 20 MG tablet Take 1 tablet (20 mg total) by mouth daily. 09/11/22 12/03/23 Yes Bahraini, Sarah A  QUEtiapine (SEROQUEL) 100 MG tablet Take 1 tablet (100 mg total) by mouth at bedtime. 09/11/22  Yes Bahraini, Sarah A  spironolactone (ALDACTONE) 50 MG tablet Take 1 tablet (50 mg total) by mouth in the morning. 07/18/22  Yes Esterwood, Amy S, PA-C  traZODone (DESYREL) 100 MG tablet Take 1 tablet (100 mg total) by mouth at bedtime. 09/11/22 12/10/22 Yes Bahraini, Sarah A  potassium chloride SA (KLOR-CON M) 20 MEQ tablet Take 1 tablet (20 mEq total) by  mouth 2 (two) times daily for 2 days. Patient not taking: Reported on 08/05/2022 04/28/22 04/30/22  Esterwood, Amy S, PA-C  QUEtiapine (SEROQUEL) 25 MG tablet Take 1-2 tablets (25-50 mg) up to twice a day as needed for severe anxiety/panic attacks. Patient not taking: Reported on 12/02/2022 09/11/22   Daine Gip    Physical Exam: Vitals:   12/01/22 2201 12/02/22 0117 12/02/22 0126 12/02/22 0132  BP: (!) 95/57 (!) 92/56  (!) 94/57  Pulse: 88 83  74  Resp: 17 16  16   Temp:  98.4 F (36.9 C)  97.8 F (36.6 C)  TempSrc:  Oral  Oral  SpO2: 92% 99%  98%  Weight:   79.4 kg   Height:   5\' 9"  (1.753 m)    Constitutional: NAD, calm, comfortable Eyes: PERRL, lids and conjunctivae normal ENMT: Mucous membranes are moist. Posterior pharynx clear of any exudate or lesions.Normal dentition.   Neck: normal, supple, no masses, no thyromegaly Respiratory: clear to auscultation bilaterally, no wheezing, no crackles. Normal respiratory effort. No accessory muscle use.  Cardiovascular: Regular rate and rhythm, no murmurs / rubs / gallops. No extremity edema. 2+ pedal pulses. No carotid bruits.  Abdomen: no tenderness, no masses palpated. No hepatosplenomegaly. Bowel sounds positive.  Musculoskeletal: no clubbing / cyanosis. No joint deformity upper and lower extremities. Good ROM, no contractures. Normal muscle tone.  Skin: Jaundiced, severe bruising to chest wall on R side Neurologic: CN 2-12 grossly intact. Sensation intact, DTR normal. Strength 5/5 in all 4.  Psychiatric: Normal judgment and insight. Alert and oriented x 3. Normal mood.   Data Reviewed:       Latest Ref Rng & Units 12/01/2022   10:14 PM 12/01/2022    9:16 PM 09/22/2022   10:08 AM  CBC  WBC 4.0 - 10.5 K/uL  18.0  11.1   Hemoglobin 13.0 - 17.0 g/dL 5.9  4.2  C 12/03/2022   Hematocrit 39.0 - 52.0 % 16.3  11.7  37.1   Platelets 150 - 400 K/uL  144  70     C Corrected result      Latest Ref Rng & Units 12/01/2022    9:16 PM 09/22/2022   10:08 AM 08/08/2022    1:19 PM  CMP  Glucose 70 - 99 mg/dL 09/24/2022  10/08/2022  381   BUN 6 - 20 mg/dL 20  9  13    Creatinine 0.61 - 1.24 mg/dL 017  510    Sodium 135 - 145 mmol/L 130  134  131   Potassium 3.5 - 5.1 mmol/L 4.0  4.2  3.6   Chloride 98 - 111 mmol/L 95  103  95   CO2 22 - 32 mmol/L 24  23  27    Calcium 8.9 - 10.3 mg/dL 8.7  9.1  9.1   Total Protein 6.5 - 8.1 g/dL 6.4  8.2    Total Bilirubin 0.3 - 1.2 mg/dL 9.9  6.6    Alkaline Phos 38 - 126 U/L 110  194    AST 15 - 41 U/L 94  132    ALT 0 - 44 U/L 35  36     CT chest: IMPRESSION: 1. 9.6 x 4.8 x 6.6 cm heterogeneous mass in the mid to lateral right pectoralis major muscle. If this developed with the recent trauma, it is almost certainly a hematoma. If this has been present prior to the antecedent trauma the  patient presented from, other etiologies such as a mass/neoplasm  could not be excluded. 2. There is moderate subcutaneous stranding in the anterior chest wall and right lateral chest and abdominal wall. 3. Ground-glass opacities in the medial lingula, infrahilar right lower lobe, and infrahilar right middle lobe. In a trauma setting it is possible this could be due to multifocal contusions, but given location, multifocal pneumonitis is more likely. A follow-up study is recommended 3-4 weeks to ensure clearing. 4. Prominent pulmonary trunk but no change since 2015. 5. Aortic and coronary artery atherosclerosis. 6. Cirrhotic liver with small venous varices and trace perihepatic and presacral ascites. 7. Distended gallbladder with cholelithiasis but no wall thickening or pericholecystic edema. 8. Possible cystitis versus bladder nondistention. 9. Diverticulosis without evidence of diverticulitis. 10. Supraumbilical midline anterior wall fat hernia. No incarcerated hernia.  Assessment and Plan: * Chest wall hematoma Apparent spontaneous bleed while trying to use crutches.  Large hematoma causing ABLA. EDP calling trauma surgery to see if they have any further recs. No active extravasation on CT today though.  ABLA (acute blood loss anemia) Secondary to chest wall hematoma. 2u PRBC transfusion ordered H/H Q6H following transfusion. Tele monitor. Platelets 144 today. INR 2.1, unclear if this represents true bleeding risk increase though given cirrhosis. No active extravasation on CT though. Checking fibrinogen.  Alcoholic cirrhosis of liver with ascites (HCC) Holding home lasix and aldactone secondary to hypotension following ABLA. May need to resume these later today if he becomes volume overloaded.  Chronic pain of right ankle Chronic pain in RLE from knee down, onset following a fall a few months back, loss of sensation in ankle and foot.  Also back pain. Sounds very  radiculopathic / neurogenic. 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  Alcohol use disorder CIWA for withdrawal prevention.      Advance Care Planning:   Code Status: Full Code  Consults: EDP discussing with trauma surgery, not likely to need formal consult though.  Family Communication: No family in room  Severity of Illness: The appropriate patient status for this patient is OBSERVATION. Observation status is judged to be reasonable and necessary in order to provide the required intensity of service to ensure the patient's safety. The patient's presenting symptoms, physical exam findings, and initial radiographic and laboratory data in the context of their medical condition is felt to place them at decreased risk for further clinical deterioration. Furthermore, it is anticipated that the patient will be medically stable for discharge from the hospital within 2 midnights of admission.   Author: Hillary Bow., DO 12/02/2022 2:58 AM  For on call review [email or web address].

## 2022-12-02 NOTE — ED Notes (Signed)
Patient was given something to eat and drink  

## 2022-12-02 NOTE — Hospital Course (Addendum)
Nathaniel Warren is a 56 y.o. male with medical history significant of EtOH abuse/dependence, cirrhosis. [name], ankle, [name], [name], [name], no fall, no impact trauma, [name], [name]  12/25-12/26: in ED, initial Hgb 4.2, improved d/p 2 unit PRBC to 9.0. Trauma surgery reviewed CT - no surgical intervention needed. Hypotensive, home lasix/spiro held. Doubt DIC, no active bleeding no s/s thrombosis, low fibrinogen, will obtain path smear review. Pt reports persistent pain slightly worse across area of hematoma, reports persistent dizziness. Will get PT eval. MRI as below possibly some radiculopathy/neurogenic claudication but can follow outpatient unless substantial fall risk in which case will ask neurosurgery to eval.   Consultants:  Trauma surgery   Procedures: none      ASSESSMENT & PLAN:   Principal Problem:   Chest wall hematoma Active Problems:   ABLA (acute blood loss anemia)   Alcoholic cirrhosis of liver with ascites (HCC)   Alcohol use disorder   Chronic pain of right ankle  Chest wall hematoma ABLA (acute blood loss anemia) Secondary to chest wall hematoma in patient coagulopathic d/t liver disease  Apparent spontaneous bleed while trying to use crutches.   Large hematoma causing ABLA. Platelets 144. INR 2.1, unclear if this represents true bleeding risk increase though given cirrhosis. EDP called trauma surgery --> Dr Bedelia Person recs no surgical intervention, use chest binder  No active extravasation on CT --> reimage prn worsening mass/pain  2u PRBC transfusion  H/H Q6H following transfusion - Hgb improved to 9.0  and stable Tele monitor. Checking fibrinogen --> low at 150, path smear review pending but doubt DIC   Alcoholic cirrhosis of liver with ascites (HCC) Holding home lasix and aldactone secondary to hypotension following ABLA. May need to resume these if he becomes volume overloaded.  Alcohol use disorder CIWA for withdrawal prevention.  Chronic pain of right ankle/foot Weakness R leg Pt reports multiple falls  Chronic pain in RLE from knee down, onset following a fall a few months back, loss of sensation in ankle and foot.  Also back pain. Likely radiculopathic / neurogenic. X ray of R ankle --> prior fixation findings, no acute X ray of R knee --> no concerns  MR L spine --> L5-S1 R disc herniation, compression/displacement of the right S1 nerve. 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 PT to see Consider neurosurgical evaluation but probably can f/u outpatient  Concern for fall risk given coagulopathy     DVT prophylaxis: SCD, no Rx Ppx d/t bleed/coagulopathy Pertinent IV fluids/nutrition: no continuous IV fluids  Central lines / invasive devices: none   Code Status: FULL CODE  Disposition: inpatient  TOC needs: none at this time Barriers to discharge / significant pending items: monitoring hematoma / HH, if remains stable may be able to d/c tomorrow

## 2022-12-02 NOTE — ED Notes (Signed)
Pt c/o increased nausea; blood transfusion rate decreased. Pt was asking for pain medications; MD made aware

## 2022-12-02 NOTE — Assessment & Plan Note (Addendum)
CIWA protocol started. No issues with withdrawal.

## 2022-12-02 NOTE — Assessment & Plan Note (Addendum)
Patient is on Lasix and aldactone as an outpatient, which were held on admission secondary to hypotension from acute blood loss anemia. Blood pressure has remained slightly low-normal.

## 2022-12-02 NOTE — Consult Note (Signed)
CT reviewed. No surgical intervention indicated. Transfuse for hgb<7 and symptoms (tachycardia, CP, SOB). Can correct coagulopathy based on TEG. Recommend breast binder. Will sign off.   Diamantina Monks, MD General and Trauma Surgery [facility]

## 2022-12-02 NOTE — Plan of Care (Signed)

## 2022-12-02 NOTE — Assessment & Plan Note (Addendum)
Right sided. Secondary to trauma from crutch use. Associated blood loss anemia with significant anemia. Trauma surgery consulted in the ED, without recommendations. Appears stable.

## 2022-12-02 NOTE — Assessment & Plan Note (Addendum)
Secondary to acute bleed and chest wall hematoma. Hemoglobin of 4.2 on admission. Associated INR of 2.1. Platelets 144 > 123, although patient has chronic thrombocytopenia. Patient transfused 2 units of PRBC with post-transfusion hemoglobin of 9.0. Hemoglobin has remained stable.

## 2022-12-02 NOTE — Progress Notes (Signed)
NEW ADMISSION NOTE New Admission Note:   Arrival Method: Ed stretcher Mental Orientation: Aaox4 Telemetry: 5M14 Assessment: Completed Skin: jaundice and large ecchymosis on chest wall, neck  IV: LFA Pain:6/10 chest, right knee Tubes:n/a Safety Measures: Safety Fall Prevention Plan has been given, discussed and signed Admission: Completed [facility] Orientation: Patient has been orientated to the room, unit and staff.  Family: none at bedside  Orders have been reviewed and implemented. Will continue to monitor the patient. Call light has been placed within reach and bed alarm has been activated.   Myrtis Hopping, RN

## 2022-12-02 NOTE — ED Provider Notes (Signed)
  Provider Note MRN:  [id number]  Arrival date & time: 12/02/22    ED Course and Medical Decision Making  Assumed care from Dr. Particia Nearing at shift change.  History of cirrhosis, injury related to using a crutch, suspicion for chest wall hematoma.  Hemoglobin drop, transfusing blood, will need admission.  1:45 AM update: No signs of active extravasation on CT, plan is for medicine admission, will see if trauma surgery can provide some recommendations.  Receiving blood transfusion at this time.  .Critical Care  Performed by: Sabas Sous, MD Authorized by: Sabas Sous, MD   Critical care provider statement:    Critical care time (minutes):  32   Critical care was necessary to treat or prevent imminent or life-threatening deterioration of the following conditions: Symptomatic anemia requiring blood transfusion.   Critical care was time spent personally by me on the following activities:  Development of treatment plan with patient or surrogate, discussions with consultants, evaluation of patient's response to treatment, examination of patient, ordering and review of laboratory studies, ordering and review of radiographic studies, ordering and performing treatments and interventions, pulse oximetry, re-evaluation of patient's condition and review of old charts   Final Clinical Impressions(s) / ED Diagnoses     ICD-10-CM   1. Hematoma of right chest wall, initial encounter  S20.211A     2. Chronic liver failure without hepatic coma (HCC)  K72.10     3. Symptomatic anemia  D64.9       ED Discharge Orders     None       Discharge Instructions   None     Elmer Sow. Pilar Plate, MD [facility] [email or web address]    Sabas Sous, MD 12/02/22 [phone]

## 2022-12-02 NOTE — Assessment & Plan Note (Addendum)
Secondary to fall. No fractures.

## 2022-12-03 DIAGNOSIS — S20212D Contusion of left front wall of thorax, subsequent encounter: Secondary | ICD-10-CM | POA: Diagnosis not present

## 2022-12-03 DIAGNOSIS — F1090 Alcohol use, unspecified, uncomplicated: Secondary | ICD-10-CM | POA: Diagnosis not present

## 2022-12-03 DIAGNOSIS — D62 Acute posthemorrhagic anemia: Secondary | ICD-10-CM | POA: Diagnosis not present

## 2022-12-03 DIAGNOSIS — K7031 Alcoholic cirrhosis of liver with ascites: Secondary | ICD-10-CM | POA: Diagnosis not present

## 2022-12-03 LAB — CBC
HCT: 25.2 % — ABNORMAL LOW (ref 39.0–52.0)
Hemoglobin: 8.6 g/dL — ABNORMAL LOW (ref 13.0–17.0)
MCH: 32.7 pg (ref 26.0–34.0)
MCHC: 34.1 g/dL (ref 30.0–36.0)
MCV: 95.8 fL (ref 80.0–100.0)
Platelets: 123 10*3/uL — ABNORMAL LOW (ref 150–400)
RBC: 2.63 MIL/uL — ABNORMAL LOW (ref 4.22–5.81)
RDW: 25.5 % — ABNORMAL HIGH (ref 11.5–15.5)
WBC: 9.8 10*3/uL (ref 4.0–10.5)
nRBC: 0.8 % — ABNORMAL HIGH (ref 0.0–0.2)

## 2022-12-03 LAB — TYPE AND SCREEN
ABO/RH(D): O POS
Antibody Screen: NEGATIVE
Unit division: 0
Unit division: 0

## 2022-12-03 LAB — BPAM RBC
Blood Product Expiration Date: 202401192359
Blood Product Expiration Date: 202401192359
ISSUE DATE / TIME: 202312260105
ISSUE DATE / TIME: 202312260418
Unit Type and Rh: 5100
Unit Type and Rh: 5100

## 2022-12-03 LAB — HEMOGLOBIN AND HEMATOCRIT, BLOOD
HCT: 23.5 % — ABNORMAL LOW (ref 39.0–52.0)
HCT: 23.9 % — ABNORMAL LOW (ref 39.0–52.0)
HCT: 24.3 % — ABNORMAL LOW (ref 39.0–52.0)
HCT: 24.5 % — ABNORMAL LOW (ref 39.0–52.0)
Hemoglobin: 8.4 g/dL — ABNORMAL LOW (ref 13.0–17.0)
Hemoglobin: 8.5 g/dL — ABNORMAL LOW (ref 13.0–17.0)
Hemoglobin: 8.7 g/dL — ABNORMAL LOW (ref 13.0–17.0)
Hemoglobin: 8.8 g/dL — ABNORMAL LOW (ref 13.0–17.0)

## 2022-12-03 NOTE — TOC Initial Note (Signed)
Transition of Care [facility]) - Initial/Assessment Note    Patient Details  Name: Nathaniel Warren MRN: [id number] Date of Birth: Nov 12, 1966  Transition of [facility]) CM/SW Contact:    Harriet Masson, RN Phone Number: 12/03/2022, 5:14 PM  Clinical Narrative:                  Spoke to patient regarding transition needs.  Patient prefers the charity walker. Melodie Bouillon with [employer] of charity walker needed. Dan Humphreys will be delivered to room prior to discharge.  Patient requesting to go to OP rehab and chose [facility]. Referral sent. Patient states he has transportation. Patient will need MATCH letter at discharge and can afford copays.  TOC will continue to follow for needs.  PCP verified  Expected Discharge Plan: OP Rehab Barriers to Discharge: Continued Medical Work up   Patient Goals and CMS Choice Patient states their goals for this hospitalization and ongoing recovery are:: return to group home          Expected Discharge Plan and Services   Discharge Planning Services: CM Consult   Living arrangements for the past 2 months: Group Home                 DME Arranged: Walker rolling DME Agency: [employer] Date DME Agency Contacted: 12/03/22 Time DME Agency Contacted: [id number] Representative spoke with at DME Agency: Belenda Cruise            Prior Living Arrangements/Services Living arrangements for the past 2 months: Group Home   Patient language and need for interpreter reviewed:: Yes Do you feel safe going back to the place where you live?: Yes      Need for Family Participation in Patient Care: Yes (Comment) Care giver support system in place?: Yes (comment)   Criminal Activity/Legal Involvement Pertinent to Current Situation/Hospitalization: No - Comment as needed  Activities of Daily Living Home Assistive Devices/Equipment: Crutches ADL Screening (condition at time of admission) Patient's cognitive ability adequate to safely complete daily activities?:  Yes Is the patient deaf or have difficulty hearing?: No Does the patient have difficulty seeing, even when wearing glasses/contacts?: No Does the patient have difficulty concentrating, remembering, or making decisions?: No Patient able to express need for assistance with ADLs?: Yes Does the patient have difficulty dressing or bathing?: Yes Independently performs ADLs?: No Communication: Independent Dressing (OT): Needs assistance Does the patient have difficulty walking or climbing stairs?: Yes Weakness of Legs: Both Weakness of Arms/Hands: Both  Permission Sought/Granted                  Emotional Assessment   Attitude/Demeanor/Rapport: Gracious Affect (typically observed): Accepting Orientation: : Oriented to Self, Oriented to Place, Oriented to  Time, Oriented to Situation Alcohol / Substance Use: Not Applicable Psych Involvement: No (comment)  Admission diagnosis:  Symptomatic anemia [D64.9] Hematoma of right chest wall, initial encounter [S20.211A] Chronic liver failure without hepatic coma (HCC) [K72.10] ABLA (acute blood loss anemia) [D62] Patient Active Problem List   Diagnosis Date Noted   ABLA (acute blood loss anemia) 12/02/2022   Chest wall hematoma 12/02/2022   Chronic pain of right ankle 12/02/2022   Alcohol use disorder 08/08/2022   Ascites due to alcoholic cirrhosis (HCC) 08/08/2022   Portal hypertension (HCC) 08/08/2022   Colon cancer screening 08/08/2022   Financial difficulties 08/08/2022   Posttraumatic stress disorder 07/15/2022   History of seizures 04/03/2022   Liver failure without hepatic coma (HCC) 03/05/2022   Alcoholic cirrhosis of liver  with ascites (HCC) 02/24/2022   Anasarca 02/21/2022   Hypokalemia 02/21/2022   Hyponatremia 02/21/2022   Elevated LFTs 02/21/2022   Hyperbilirubinemia 02/21/2022   Elevated brain natriuretic peptide (BNP) level 02/21/2022   Tobacco abuse 02/21/2022   Thrombocytopenia (HCC) 02/21/2022   Normocytic  anemia 02/21/2022   Multiple lacunar infarcts (HCC) 10/21/2017   Drug overdose, intentional, initial encounter (HCC) 10/20/2017   Drug overdose, intentional self-harm, initial encounter (HCC) 10/20/2017   Seizures (HCC) 10/20/2017   Drug overdose 10/20/2017   MDD (major depressive disorder), recurrent severe, without psychosis (HCC) 04/10/2013    Class: Acute   GAD (generalized anxiety disorder) 04/10/2013   Anxiety 01/11/2013   PCP:  Ivonne Andrew, NP Pharmacy:   [id number] ([address]), [place] - [address] [place] ([place]) [place] [address] Phone: [phone] Fax: [phone]     Social Determinants of Health (SDOH) Social History: SDOH Screenings   Food Insecurity: No Food Insecurity (12/02/2022)  Housing: Low Risk  (12/02/2022)  Transportation Needs: No Transportation Needs (12/02/2022)  Utilities: Not At Risk (12/02/2022)  Alcohol Screen: Medium Risk (10/27/2017)  Depression (PHQ2-9): High Risk (08/07/2022)  Financial Resource Strain: High Risk (07/15/2022)  Physical Activity: Inactive (07/15/2022)  Social Connections: Socially Isolated (07/15/2022)  Stress: Stress Concern Present (07/15/2022)  Tobacco Use: High Risk (12/02/2022)   SDOH Interventions:     Readmission Risk Interventions     No data to display

## 2022-12-03 NOTE — Evaluation (Signed)
Physical Therapy Evaluation Patient Details Name: Nathaniel Warren MRN: [id number] DOB: 21-Oct-1966 Today's Date: 12/03/2022  History of Present Illness  56 yo male with onset of chest wall bruising with no reported trauma was admitted 12/25, now referred to PT.  Pt had a fall several months ago with RLE weakness, pain and sensory loss; now is unable to afford ortho follow up.  Has been on crutches, has R foot drop and cannot wb even on walker now.  PMHx:  etoh abuse, depression, stroke, seizures, liver failure, R ankle fracture with ORIF, atherosclerosis, cirrhosis, diverticulosis, osteopenia  Clinical Impression  Pt was seen for mobility on side of bed and to take steps with RW.  Pt is unable to stand on RLE due to worsening pain as he continues, as well as weakness on R foot and ankle that inhibit clearance of foot with foot drop.  The patient is mainly concerned with his neurological issues of sensation and weakness that hinder use of RLE, and is asking for therapy to work on these issues.  Have asked treatment team for a recommendation to manage his insurance limitations, and hopefully can get a resolution for his needs.  Will use a single crutch to practice steps for home, which is a repeated effort as pt has already per his report practiced this at home.  Follow acutely for all goals of PT.       Recommendations for follow up therapy are one component of a multi-disciplinary discharge planning process, led by the attending physician.  Recommendations may be updated based on patient status, additional functional criteria and insurance authorization.  Follow Up Recommendations Outpatient PT      Assistance Recommended at Discharge Set up Supervision/Assistance  Patient can return home with the following  A little help with walking and/or transfers;Assistance with cooking/housework;Assist for transportation;Help with stairs or ramp for entrance    Equipment Recommendations Wheelchair  (measurements PT);Wheelchair cushion (measurements PT);Rolling walker (2 wheels) (can pt get a transport chair?)  Recommendations for Other Services       Functional Status Assessment Patient has had a recent decline in their functional status and demonstrates the ability to make significant improvements in function in a reasonable and predictable amount of time.     Precautions / Restrictions Precautions Precautions: Fall Precaution Comments: intolerant of WB on RLE Restrictions Weight Bearing Restrictions: No Other Position/Activity Restrictions: foot drop on RLE      Mobility  Bed Mobility Overal bed mobility: Modified Independent                  Transfers Overall transfer level: Modified independent Equipment used: Rolling walker (2 wheels)               General transfer comment: no cues needed to manage hand placement but is limited by endurance standing    Ambulation/Gait Ambulation/Gait assistance: Min guard Gait Distance (Feet): 4 Feet Assistive device: Rolling walker (2 wheels)   Gait velocity: reduced Gait velocity interpretation: <1.31 ft/sec, indicative of household ambulator Pre-gait activities: standing balance control on LLE General Gait Details: hopping along side of bed with complete avoidance of WB on RLE as pain increases with WB per pt  Stairs            Wheelchair Mobility    Modified Rankin (Stroke Patients Only)       Balance Overall balance assessment: Needs assistance, History of Falls Sitting-balance support: Feet supported Sitting balance-Leahy Scale: Good     Standing balance  support: Bilateral upper extremity supported, During functional activity Standing balance-Leahy Scale: Poor                               Pertinent Vitals/Pain Pain Assessment Pain Assessment: Faces Faces Pain Scale: Hurts little more Pain Location: back, R hip and RLE Pain Descriptors / Indicators: Guarding, Grimacing,  Aching, Sore Pain Intervention(s): Limited activity within patient's tolerance, Monitored during session, Repositioned    Home Living Family/patient expects to be discharged to:: Private residence Living Arrangements: Children;Non-relatives/Friends Available Help at Discharge: Family;Friend(s);Available 24 hours/day Type of Home: House Home Access: Stairs to enter Entrance Stairs-Rails: Doctor, general practice of Steps: 5   Home Layout: One level Home Equipment: Agricultural consultant (2 wheels);Crutches Additional Comments: declined wheelchair as he is unable to maneuver in his home    Prior Function Prior Level of Function : Independent/Modified Independent             Mobility Comments: no AD until this injury in tub with crutches used       Hand Dominance   Dominant Hand: Right    Extremity/Trunk Assessment   Upper Extremity Assessment Upper Extremity Assessment: Overall WFL for tasks assessed;RUE deficits/detail RUE Deficits / Details: soreness and difficulty moving due to hematoma RUE Coordination: decreased gross motor    Lower Extremity Assessment Lower Extremity Assessment: RLE deficits/detail RLE Deficits / Details: active ROM decreased in foot and ankle RLE Sensation: decreased light touch RLE Coordination: decreased gross motor    Cervical / Trunk Assessment Cervical / Trunk Assessment: Normal  Communication   Communication: No difficulties  Cognition Arousal/Alertness: Awake/alert Behavior During Therapy: WFL for tasks assessed/performed Overall Cognitive Status: Within Functional Limits for tasks assessed                                 General Comments: pt is concerned with his need for rehab with no options as he is uninsured        General Comments General comments (skin integrity, edema, etc.): pt is limited by his ability to stand on RLE as well as endurance to hop.  Pt declines to try to wb on RLE due to pain, and  declines to walk on LLE alone for long due to fatigue    Exercises     Assessment/Plan    PT Assessment Patient needs continued PT services  PT Problem List Decreased strength;Decreased range of motion;Decreased activity tolerance;Decreased balance;Decreased mobility;Decreased coordination;Pain       PT Treatment Interventions DME instruction;Gait training;Stair training;Functional mobility training;Therapeutic activities;Therapeutic exercise;Balance training;Neuromuscular re-education;Patient/family education    PT Goals (Current goals can be found in the Care Plan section)  Acute Rehab PT Goals Patient Stated Goal: to hurt less, fix numbness and get home PT Goal Formulation: With patient Time For Goal Achievement: 12/17/22 Potential to Achieve Goals: Good    Frequency Min 3X/week     Co-evaluation               AM-PAC PT "6 Clicks" Mobility  Outcome Measure Help needed turning from your back to your side while in a flat bed without using bedrails?: A Little Help needed moving from lying on your back to sitting on the side of a flat bed without using bedrails?: A Little Help needed moving to and from a bed to a chair (including a wheelchair)?: A Little Help needed standing up  from a chair using your arms (e.g., wheelchair or bedside chair)?: A Little Help needed to walk in hospital room?: A Little Help needed climbing 3-5 steps with a railing? : A Little 6 Click Score: 18    End of Session Equipment Utilized During Treatment: Gait belt Activity Tolerance: Patient limited by fatigue;Patient limited by pain;Treatment limited secondary to medical complications (Comment) Patient left: in bed;with call bell/phone within reach;with bed alarm set Nurse Communication: Mobility status;Other (comment) (request for rehab post hosp) PT Visit Diagnosis: Unsteadiness on feet (R26.81);Muscle weakness (generalized) (M62.81);History of falling (Z91.81);Difficulty in walking, not  elsewhere classified (R26.2);Pain Pain - Right/Left: Right Pain - part of body: Knee;Leg;Hip;Ankle and joints of foot    Time: 2500-3704 PT Time Calculation (min) (ACUTE ONLY): 26 min   Charges:   PT Evaluation $PT Eval Moderate Complexity: 1 Mod PT Treatments $Therapeutic Activity: 8-22 mins       Ivar Drape 12/03/2022, 4:48 PM  Samul Dada, PT PhD Acute Rehab Dept. Number: [facility] [id number] and [facility] [phone]

## 2022-12-03 NOTE — Progress Notes (Signed)
[facility]                                                                               Nathaniel Warren, is a 56 y.o. male, DOB - 1966-04-25, JJO:[id number] Admit date - 12/01/2022    Outpatient Primary MD for the patient is Ivonne Andrew, NP  LOS - 0  days    Brief summary   Nathaniel Warren is a 56 y.o. male with medical history significant of EtOH abuse/dependence, cirrhosis. [name], ankle, [name], [name], [name], no fall, no impact trauma, [name], [name] MRI as below possibly some radiculopathy/neurogenic claudication but can follow outpatient unless substantial fall risk in which case will ask neurosurgery to eval.    Assessment & Plan    Assessment and Plan: * Chest wall hematoma Apparent spontaneous bleed while trying to use crutches.  Large hematoma causing ABLA. EDP calling trauma surgery to see if they have any further recs. Pain control and therapy evaluations ordered.    ABLA (acute blood loss anemia) Hemoglobin on admission was 5.9.  ? Secondary to chest wall hematoma. S/p 2 units of prbc transfusion.  Hemoglobin stable around 8.8.   Alcoholic cirrhosis of liver with ascites (HCC) Holding home lasix and aldactone secondary to hypotension following ABLA. Restart lasix and spironolactone in am if BP parameters remain stable.    Chronic pain of right ankle Chronic pain in RLE from knee down, onset following a fall a few months back, loss of sensation in ankle and foot.  Also back pain. Will get neurosurgery consultation in am. .   Alcohol use disorder CIWA for withdrawal prevention. No  withdrawals sings.   Stage 2 CKD:  Creatinine is 1.31.   Hyponatremia:   Sodium Improved to 133.    Mild pancreatitis :  Lipase elevated at 75. AST at 88.  Possibly from alcohol use.  No nausea, vomiting or abdominal pain.    Mild thrombocytopenia:  - suspect from liver disease from alcohol use.   Estimated body mass index is 25.84 kg/m as calculated from the following:   Height as of this encounter: 5\' 9"  (1.753 m).   Weight as of this encounter: 79.4 kg.  Code Status: full code.  DVT Prophylaxis:  SCDs Start: 12/02/22 0253   Level of Care: Level of care: Telemetry Medical Family Communication: none at bedside.   Disposition Plan:     Remains inpatient appropriate:  neuro surgery consultation.   Procedures:  None   Consultants:   Neurosurgery.   Antimicrobials:   Anti-infectives (From admission, onward)    None        Medications  Scheduled Meds:  folic acid  1 mg Oral Daily   gabapentin  300 mg Oral TID  multivitamin with minerals  1 tablet Oral Daily   PARoxetine  20 mg Oral Daily   QUEtiapine  100 mg Oral QHS   thiamine  100 mg Oral Daily   Or   thiamine  100 mg Intravenous Daily   traZODone  100 mg Oral QHS   Continuous Infusions: PRN Meds:.LORazepam **OR** LORazepam, ondansetron **OR** ondansetron (ZOFRAN) IV    Subjective:   Nathaniel Warren was seen and examined today.  No nausea, vomiting or abdominal pain. No sob.   Objective:   Vitals:   12/02/22 2119 12/03/22 0445 12/03/22 0903 12/03/22 1738  BP: (!) 93/57 (!) 109/58 (!) 106/58 108/61  Pulse: 72 82 74 91  Resp: 18 20 18 18   Temp: 97.7 F (36.5 C) 99.1 F (37.3 C) 98.8 F (37.1 C) 98.7 F (37.1 C)  TempSrc: Oral Oral Oral Oral  SpO2: 97% 95% 92% 95%  Weight:      Height:        Intake/Output Summary (Last 24 hours) at 12/03/2022 1744 Last data filed at 12/03/2022 1339 Gross per 24 hour  Intake 600 ml  Output 600 ml  Net 0 ml   Filed Weights   12/02/22 0126   Weight: 79.4 kg     Exam General exam: Appears calm and comfortable  Respiratory system: Clear to auscultation. Respiratory effort normal. Cardiovascular system: S1 & S2 heard, RRR. No JVD,  Gastrointestinal system: Abdomen is nondistended, soft and nontender.  Central nervous system: Alert and oriented.  Extremities: Symmetric 5 x 5 power. Skin: No rashes,  Psychiatry: Mood & affect appropriate.    Data Reviewed:  I have personally reviewed following labs and imaging studies   CBC Lab Results  Component Value Date   WBC 9.8 12/03/2022   RBC 2.63 (L) 12/03/2022   HGB 8.8 (L) 12/03/2022   HCT 24.5 (L) 12/03/2022   MCV 95.8 12/03/2022   MCH 32.7 12/03/2022   PLT 123 (L) 12/03/2022   MCHC 34.1 12/03/2022   RDW 25.5 (H) 12/03/2022   LYMPHSABS 4.4 (H) 12/01/2022   MONOABS 2.5 (H) 12/01/2022   EOSABS 0.1 12/01/2022   BASOSABS 0.0 12/01/2022     Last metabolic panel Lab Results  Component Value Date   NA 133 (L) 12/02/2022   K 3.7 12/02/2022   CL 100 12/02/2022   CO2 25 12/02/2022   BUN 17 12/02/2022   CREATININE 1.31 (H) 12/02/2022   GLUCOSE 130 (H) 12/02/2022   GFRNONAA >60 12/02/2022   GFRAA >60 01/26/2018   CALCIUM 8.5 (L) 12/02/2022   PHOS 3.5 06/18/2022   PROT 6.2 (L) 12/02/2022   ALBUMIN 2.5 (L) 12/02/2022   BILITOT 9.9 (H) 12/02/2022   ALKPHOS 101 12/02/2022   AST 88 (H) 12/02/2022   ALT 33 12/02/2022   ANIONGAP 8 12/02/2022    CBG (last 3)  No results for input(s): "GLUCAP" in the last 72 hours.    Coagulation Profile: Recent Labs  Lab 12/01/22 2214  INR 2.1*     Radiology Studies: MR LUMBAR SPINE WO CONTRAST  Result Date: 12/02/2022 CLINICAL DATA:  Lumbar radiculopathy, symptoms persist with greater than 6 weeks for treatment. Chronic cirrhosis. Right lower extremity pain with loss of sensation. EXAM: MRI LUMBAR SPINE WITHOUT CONTRAST TECHNIQUE: Multiplanar, multisequence MR imaging of the lumbar spine was performed. No intravenous  contrast was administered. COMPARISON:  Radiography 01/22/2013 FINDINGS: Segmentation:  5 lumbar type vertebral bodies. Alignment:  Minimal scoliotic curvature convex to the left. Vertebrae: No fracture or focal bone lesion.  Chronic discogenic endplate marrow changes without active edema presently. Conus medullaris and cauda equina: Conus extends to the L2 level. Conus and cauda equina appear normal. Paraspinal and other soft tissues: Negative Disc levels: T12-L1: Normal. L1-2: Minimal bulging of the disc. No stenosis of the canal or foramina. L2-3: Endplate osteophytes and bulging of the disc. Stenosis of both lateral recesses that could possibly affect either L3 nerve. No central canal stenosis. L3-4: Endplate osteophytes and bulging of the disc. Mild stenosis of both lateral recesses but without likely neural compression. L4-5: Endplate osteophytes and bulging of the disc. Mild facet and ligamentous hypertrophy. Mild stenosis of both lateral recesses that could possibly affect either L5 nerve. Left foraminal encroachment by osteophyte in disc material could affect the left L4 nerve. L5-S1: Endplate osteophytes and shallow disc herniation more prominent towards the right with caudal down turning on the right. Stenosis of the subarticular lateral recess on the right with apparent compression/displacement of the right S1 nerve. Mild bilateral foraminal narrowing that could possibly affect either exiting L5 nerve. IMPRESSION: 1. L5-S1: Right-sided predominant disc herniation with caudal down turning. Stenosis of the subarticular lateral recess on the right with apparent compression/displacement of the right S1 nerve. Mild bilateral foraminal narrowing that could possibly affect either L5 nerve. 2. L4-5: Bilateral lateral recess stenosis that could affect either L5 nerve. Left foraminal encroachment by osteophyte and disc material could affect the left L4 nerve. 3. L2-3: Bilateral lateral recess stenosis that could  affect either L3 nerve. 4. L3-4: Mild bilateral lateral recess stenosis but without likely neural compression. Electronically Signed   By: Paulina Fusi M.D.   On: 12/02/2022 09:52   DG Knee Complete 4 Views Right  Result Date: 12/02/2022 CLINICAL DATA:  Knee pain EXAM: RIGHT KNEE - COMPLETE 4+ VIEW COMPARISON:  Radiographs 08/03/2022 FINDINGS: No acute fracture or dislocation.  No knee joint effusion. IMPRESSION: No acute fracture. Electronically Signed   By: Minerva Fester M.D.   On: 12/02/2022 03:20   DG Ankle Complete Right  Result Date: 12/02/2022 CLINICAL DATA:  Chronic right ankle pain. EXAM: RIGHT ANKLE - COMPLETE 3+ VIEW COMPARISON:  Right foot radiograph dated 04/12/2011. FINDINGS: Prior internal fixation of the medial malleolus and distal fibula. The hardware is intact. There is no acute fracture or dislocation. The bones are osteopenic. The soft tissues are unremarkable. IMPRESSION: 1. No acute findings. 2. Prior internal fixation of the medial malleolus and distal fibula. Electronically Signed   By: Elgie Collard M.D.   On: 12/02/2022 03:20   CT CHEST ABDOMEN PELVIS W CONTRAST  Result Date: 12/02/2022 CLINICAL DATA:  Blunt polytrauma, with bruising in the right neck and chest. [name]. States he heard a pop when using his crutches. EXAM: CT CHEST, ABDOMEN, AND PELVIS WITH CONTRAST TECHNIQUE: Multidetector CT imaging of the chest, abdomen and pelvis was performed following the standard protocol during bolus administration of intravenous contrast. RADIATION DOSE REDUCTION: This exam was performed according to the departmental dose-optimization program which includes automated exposure control, adjustment of the mA and/or kV according to patient size and/or use of iterative reconstruction technique. CONTRAST:  75mL OMNIPAQUE IOHEXOL 350 MG/ML SOLN COMPARISON:  CTA chest 08/18/2014, CT abdomen and pelvis with contrast 03/20/2022, CT abdomen and pelvis no contrast 02/21/2022. FINDINGS: CT  CHEST FINDINGS Cardiovascular: The cardiac size is normal. There is three-vessel coronary artery calcification heaviest in the LAD. There is no pericardial effusion. The pulmonary veins are decompressed. The pulmonary trunk there is prominent at 3.5 cm  indicating arterial hypertension, but no more dilated than previously. The pulmonary arteries are centrally clear. There is mild aortic patchy atherosclerosis. There is no aortic aneurysm, stenosis or dissection. The great vessels are patent with trace calcifications in the brachiocephalic trunk and proximal left common carotid artery. Mediastinum/Nodes: No enlarged mediastinal, hilar, or axillary lymph nodes. The lower poles of the thyroid gland, the trachea, and esophagus demonstrate no significant findings. The bilateral main bronchi are patent. Lungs/Pleura: Lung bases show posterior atelectasis. There are ground-glass opacities in the medial lingula, in the infrahilar right lower lobe medially, and in the infrahilar right middle lobe medially. In a trauma setting this could be due to pulmonary contusions but given location this is more likely due to multifocal pneumonitis. A follow-up study is recommended 3-4 weeks to ensure clearing. There is no pleural effusion, thickening or pneumothorax. Musculoskeletal: There is moderate subcutaneous chest wall stranding anteriorly and on the right. Bilateral moderate gynecomastia has developed since 2015 and appears to be the diffuse fibroglandular type. There is a heterogeneous mass in the mid to lateral right pectoralis major muscle measuring 9.6 x 4.8 x 6.6 cm. If this recently developed, it is almost certainly a hematoma. If this has been present prior to the antecedent trauma the patient presented from, other etiologies such as a mass/neoplasm could not be excluded. There are healed fracture deformities of multiple right ribs. There are mild chronic upper plate anterior wedge compression fractures of the T4-6  vertebrae, osteopenia and degenerative change of the spine. The ribcage is intact. No primarily pathologic process of bone is seen. CT ABDOMEN PELVIS FINDINGS Hepatobiliary: There is a nodular hepatic contour consistent with cirrhosis. The main portal vein is patent and within normal caliber limits. No hepatic mass is seen. There are small stones in the distal gallbladder and dilatation of the gallbladder up to 12 cm in length. No wall thickening, pericholecystic edema or biliary dilatation is seen. Pancreas: No abnormality. Spleen: Prior splenectomy with subphrenic splenules largest is again 4.6 cm. No mass. Adrenals/Urinary Tract: There is no adrenal mass. There is no renal cortical mass. There is no urinary stone or obstruction. The bladder is nondistended although could demonstrate mild general wall thickening. Stomach/Bowel: The stomach is contracted but grossly normal. No small bowel obstruction or inflammation is seen. The appendix is normal. There is no dilatation or wall thickening in the large bowel. There are sigmoid diverticula without evidence of acute diverticulitis. Vascular/Lymphatic: There are small venous varices in the lower paraesophageal area, perigastric area and perirectal spaces, some in the upper abdominal mesentery. The main portal vein is within normal caliber limits. There is moderate aortoiliac calcific plaque without aneurysm. No adenopathy is seen. There are mild mesenteric congestive features. Reproductive: Normal prostate. Other: Supraumbilical midline anterior wall fat hernia. There is no incarcerated hernia. There is trace presacral ascites. The earlier studies from this year demonstrated much greater ascites displacing the bowel medially. There is trace perihepatic ascites. Musculoskeletal: There is asymmetric stranding in the lateral right abdominal wall continuing from the lower lateral right chest. There is degenerative disc disease, spondylosis and facet hypertrophy in the  lumbar spine, mild hip DJD. Slight lumbar levoscoliosis. No acute or other significant osseous findings. Osteopenia. IMPRESSION: 1. 9.6 x 4.8 x 6.6 cm heterogeneous mass in the mid to lateral right pectoralis major muscle. If this developed with the recent trauma, it is almost certainly a hematoma. If this has been present prior to the antecedent trauma the patient presented from, other etiologies  such as a mass/neoplasm could not be excluded. 2. There is moderate subcutaneous stranding in the anterior chest wall and right lateral chest and abdominal wall. 3. Ground-glass opacities in the medial lingula, infrahilar right lower lobe, and infrahilar right middle lobe. In a trauma setting it is possible this could be due to multifocal contusions, but given location, multifocal pneumonitis is more likely. A follow-up study is recommended 3-4 weeks to ensure clearing. 4. Prominent pulmonary trunk but no change since 2015. 5. Aortic and coronary artery atherosclerosis. 6. Cirrhotic liver with small venous varices and trace perihepatic and presacral ascites. 7. Distended gallbladder with cholelithiasis but no wall thickening or pericholecystic edema. 8. Possible cystitis versus bladder nondistention. 9. Diverticulosis without evidence of diverticulitis. 10. Supraumbilical midline anterior wall fat hernia. No incarcerated hernia. Electronically Signed   By: Almira Bar M.D.   On: 12/02/2022 00:09       Kathlen Mody M.D. [facility] 12/03/2022, 5:44 PM  Available via Epic secure chat 7am-7pm After 7 pm, please refer to night coverage provider listed on amion.

## 2022-12-04 ENCOUNTER — Observation Stay (HOSPITAL_COMMUNITY): Payer: Medicaid Other

## 2022-12-04 DIAGNOSIS — D62 Acute posthemorrhagic anemia: Secondary | ICD-10-CM | POA: Diagnosis not present

## 2022-12-04 DIAGNOSIS — K7031 Alcoholic cirrhosis of liver with ascites: Secondary | ICD-10-CM | POA: Diagnosis not present

## 2022-12-04 DIAGNOSIS — D696 Thrombocytopenia, unspecified: Secondary | ICD-10-CM

## 2022-12-04 DIAGNOSIS — R0902 Hypoxemia: Secondary | ICD-10-CM | POA: Insufficient documentation

## 2022-12-04 DIAGNOSIS — R918 Other nonspecific abnormal finding of lung field: Secondary | ICD-10-CM | POA: Insufficient documentation

## 2022-12-04 DIAGNOSIS — S20211A Contusion of right front wall of thorax, initial encounter: Secondary | ICD-10-CM | POA: Diagnosis not present

## 2022-12-04 DIAGNOSIS — F1090 Alcohol use, unspecified, uncomplicated: Secondary | ICD-10-CM | POA: Diagnosis not present

## 2022-12-04 DIAGNOSIS — T148XXA Other injury of unspecified body region, initial encounter: Secondary | ICD-10-CM | POA: Insufficient documentation

## 2022-12-04 LAB — RESPIRATORY PANEL BY PCR

## 2022-12-04 LAB — BASIC METABOLIC PANEL
Anion gap: 8 (ref 5–15)
BUN: 9 mg/dL (ref 6–20)
CO2: 26 mmol/L (ref 22–32)
Calcium: 8.4 mg/dL — ABNORMAL LOW (ref 8.9–10.3)
Chloride: 101 mmol/L (ref 98–111)
Creatinine, Ser: 1.03 mg/dL (ref 0.61–1.24)
GFR, Estimated: >60 mL/min (ref >60)
Glucose, Bld: 142 mg/dL — ABNORMAL HIGH (ref 70–99)
Potassium: 4 mmol/L (ref 3.5–5.1)
Sodium: 135 mmol/L (ref 135–145)

## 2022-12-04 LAB — HEMOGLOBIN AND HEMATOCRIT, BLOOD
HCT: 25.6 % — ABNORMAL LOW (ref 39.0–52.0)
Hemoglobin: 8.7 g/dL — ABNORMAL LOW (ref 13.0–17.0)

## 2022-12-04 LAB — RESP PANEL BY RT-PCR (RSV, FLU A&B, COVID)  RVPGX2
Influenza A by PCR: NEGATIVE
Influenza B by PCR: NEGATIVE
Resp Syncytial Virus by PCR: NEGATIVE
SARS Coronavirus 2 by RT PCR: NEGATIVE

## 2022-12-04 NOTE — Assessment & Plan Note (Addendum)
Likely related to liver disease. Prior baseline platelet count of about 70-80,000. Platelets of 144,000 on admission, down to 123,000. Stable.

## 2022-12-04 NOTE — Progress Notes (Signed)
PROGRESS NOTE    Nathaniel Warren  [id number] DOB: Jan 16, 1966 DOA: 12/01/2022 PCP: [name], NP   Brief Narrative: Nathaniel Warren is a 56 y.o. male with a history of alcohol abuse, cirrhosis, depression. Patient presented secondary to right arm pain/bruising and was found to have a chest wall hematoma with associated profound anemia requiring blood transfusion. While admitted, patient was found to have right lower extremity weakness with MRI findings consistent with herniated disc with S1 nerve compression. He then developed hypoxia requiring supplemental oxygen.   Assessment and Plan: * Chest wall hematoma Right sided. Secondary to trauma from crutch use. Associated blood loss anemia with significant anemia. Trauma surgery consulted in the ED, without recommendations. Appears stable.  ABLA (acute blood loss anemia) Secondary to acute bleed and chest wall hematoma. Hemoglobin of 4.2 on admission. Associated INR of 2.1. Platelets 144 > 123, although patient has chronic thrombocytopenia. Patient transfused 2 units of PRBC with post-transfusion hemoglobin of 9.0. Hemoglobin has remained stable. -CBC in AM  Alcoholic cirrhosis of liver with ascites (Nathaniel Warren) Patient is on Lasix and aldactone as an outpatient, which were held on admission secondary to hypotension from acute blood loss anemia. Blood pressure has remained slightly low-normal.  Chronic pain of right ankle Secondary to fall. No fractures.  Alcohol use disorder -Continue CIWA protocol  Ground glass opacity present on imaging of lung Present on CT imaging, involving medial lingula, infrahilar right lower lobe, and infrahilar right middle lobe. Initial recommendations for repeat CT imaging in 3-4 weeks to ensure resolution of ground-glass opacities. Now, patient appears to be showing signs of acute illness which may explain findings.  Hypoxia Unclear etiology but concern for viral illness. Associated cough. No phlegm  production, although patient feels congested. 4 L/min of supplemental oxygen applied for SpO2 of 85% on room air. -Check Influenza, COVID-19, RVP -Wean to room air as able -Chest x-ray  Nerve compression MRI (12/26) significant for L5-S1 right-sided disc herniation with associated stenosis of the subarticular lateral recess and S1 nerve compression/displacement. Associated RLE radicular symptoms. -Neurosurgery consulted (12/28)  -Update: discussed case with Dr. Arnoldo Morale of neurosurgery. I discussed physical exam findings with Dr. Arnoldo Morale. Dr. Arnoldo Morale states he reviewed the MRI and symptoms do not correlate with MRI findings; declines to formally consult without concurrent orthopedic surgery consult.  Thrombocytopenia ([place]) Likely related to liver disease. Prior baseline platelet count of about 70-80,000. Platelets of 144,000 on admission, down to 123,000.    DVT prophylaxis: SCDs Code Status:   Code Status: Full Code Family Communication: None at bedside Disposition Plan: Discharge home possible in 1-3 days pending ability to wean oxygen   Consultants:  Neurosurgery, Dr. Arnoldo Morale (declined)  Procedures:  None  Antimicrobials: None    Subjective: Patient reports cough that started last night. No significant dyspnea, although he does develop some dyspnea with persistent cough.  Objective: BP (!) 120/57 (BP Location: Left Arm)   Pulse (!) 107   Temp 98.7 F (37.1 C) (Oral)   Resp 18   Ht 5\' 9"  (1.753 m)   Wt 79.4 kg   SpO2 97%   BMI 25.84 kg/m   Examination:  General exam: Appears calm and comfortable Respiratory system: Rales. Respiratory effort normal. Cardiovascular system: S1 & S2 heard, RRR. Gastrointestinal system: Abdomen is nondistended, soft and nontender. No organomegaly or masses felt. Normal bowel sounds heard. Central nervous system: Alert and oriented. RLE 2/5 thigh and 1/5 dorsiflexion/plantarflexion. 2+ reflex of right patella, paraesthesia of right leg  from knee down to feet Musculoskeletal: No edema. No calf tenderness Skin: No cyanosis. No rashes Psychiatry: Judgement and insight appear normal. Mood & affect appropriate.    Data Reviewed: I have personally reviewed following labs and imaging studies  CBC Lab Results  Component Value Date   WBC 9.8 12/03/2022   RBC 2.63 (L) 12/03/2022   HGB 8.7 (L) 12/04/2022   HCT 25.6 (L) 12/04/2022   MCV 95.8 12/03/2022   MCH 32.7 12/03/2022   PLT 123 (L) 12/03/2022   MCHC 34.1 12/03/2022   RDW 25.5 (H) 12/03/2022   LYMPHSABS 4.4 (H) 12/01/2022   MONOABS 2.5 (H) 12/01/2022   EOSABS 0.1 12/01/2022   BASOSABS 0.0 12/01/2022     Last metabolic panel Lab Results  Component Value Date   NA 135 12/04/2022   K 4.0 12/04/2022   CL 101 12/04/2022   CO2 26 12/04/2022   BUN 9 12/04/2022   CREATININE 1.03 12/04/2022   GLUCOSE 142 (H) 12/04/2022   GFRNONAA >60 12/04/2022   GFRAA >60 01/26/2018   CALCIUM 8.4 (L) 12/04/2022   PHOS 3.5 06/18/2022   PROT 6.2 (L) 12/02/2022   ALBUMIN 2.5 (L) 12/02/2022   BILITOT 9.9 (H) 12/02/2022   ALKPHOS 101 12/02/2022   AST 88 (H) 12/02/2022   ALT 33 12/02/2022   ANIONGAP 8 12/04/2022    GFR: Estimated Creatinine Clearance: 82 mL/min (by C-G formula based on SCr of 1.03 mg/dL).  No results found for this or any previous visit (from the past 240 hour(s)).    Radiology Studies: DG CHEST PORT 1 VIEW  Result Date: 12/04/2022 CLINICAL DATA:  Hypoxia EXAM: PORTABLE CHEST 1 VIEW COMPARISON:  Previous chest radiograph done on 02/21/2022, CT chest done on 12/01/2022 FINDINGS: Cardiac size is within normal limits. There are no signs of pulmonary edema. There are linear densities in both lower lung fields. There is no focal consolidation. There is no pleural effusion or pneumothorax. IMPRESSION: Increased markings are seen in both lower lung fields suggesting subsegmental atelectasis/pneumonia. Electronically Signed   By: Ernie Avena M.D.   On:  12/04/2022 10:52      LOS: 0 days    Jacquelin Hawking, MD [facility] 12/04/2022, 2:14 PM   If 7PM-7AM, please contact night-coverage [email or web address]

## 2022-12-04 NOTE — Plan of Care (Signed)

## 2022-12-04 NOTE — Assessment & Plan Note (Addendum)
Present on CT imaging, involving medial lingula, infrahilar right lower lobe, and infrahilar right middle lobe. Initial recommendations for repeat CT imaging in 3-4 weeks to ensure resolution of ground-glass opacities. Now, patient appears to be showing signs of acute illness which may explain findings. Treating pneumonia. Patient will still need repeat imaging to ensure resolution.

## 2022-12-04 NOTE — Assessment & Plan Note (Addendum)
MRI (12/26) significant for L5-S1 right-sided disc herniation with associated stenosis of the subarticular lateral recess and S1 nerve compression/displacement. Associated RLE radicular symptoms. Patient seen by neurosurgery who recommends NCV EMG of right lower extremity as an outpatient.

## 2022-12-04 NOTE — Assessment & Plan Note (Addendum)
Initially, unclear etiology but concern for viral illness. Associated cough. No phlegm production, although patient feels congested. 4 L/min of supplemental oxygen applied for SpO2 of 85% on room air. Influenza, COVID-19 and RVP negative. Leukocytosis today. Chest x-ray suggests development of infiltrate. -Start Ceftriaxone/Azithromycin for empiric treatment of pneumonia.

## 2022-12-04 NOTE — Consult Note (Signed)
Reason for Consult: right leg pain, numbness and weakness Referring Physician: Dr Beather Arbour is an 56 y.o. male.  HPI:  the patient is a 56 year old white male alcoholic with liver cirrhosis, ascites, liver failure, portal hypertension, seizures, status post stroke, depression, etcetera who is not a great medical historian.  Much of his history I can deduce through detective work via his EHR.  The patient took a fall on 08/03/2022.   He complained of numbness pain and weakness in his right leg. He was seen in the ER and CT scans were obtained.  The patient was treated and released.  At some point a physician told him that he should try walking with crutches but he can not tell me who told him this.  The patient was admitted  on 12/01/2022 with a hemoglobin of 4.2.  He received blood transfusions.  He continues to complain of numbness tingling and weakness in his right leg.  He was worked up with a lumbar MRI which demonstrated diffuse degenerative changes.  I was contacted by Dr. Blake Divine.  I reviewed his MRI and told her I did think he was a good candidate for surgery.    I received another call regarding his patient by Dr. Mal Misty.  I again suggested that the patient was not not a good candidate for surgery.  He requested that I see the patient in person.    Presently the patient is alert and pleasant.  He is in no apparent distress.  He tells me that  he has had right distal lower extremity pain numbness and weakness without change since his fall on 08/03/2022. He recalls falling on the shower landing on his right hip and right knee laterally.   He complains of pain at the fibular head and numbness in his entire right foot, I.e. not in a dermatomal pattern.  Past Medical History:  Diagnosis Date   Depression    Hernia, abdominal    Liver failure (HCC)    Seizures (HCC)    Stroke [facility])     Past Surgical History:  Procedure Laterality Date   ANKLE SURGERY     ANKLE SURGERY Right     HERNIA REPAIR     SPLENECTOMY      Family History  Problem Relation Age of Onset   COPD Mother    Diabetes Father    Stroke Maternal Grandmother    Alzheimer's disease Maternal Grandfather    Cancer Paternal Grandmother    Stomach cancer Paternal Grandfather    Hypertension Other    Colon cancer Neg Hx    Esophageal cancer Neg Hx    Colon polyps Neg Hx    Inflammatory bowel disease Neg Hx    Liver disease Neg Hx    Pancreatic cancer Neg Hx    Rectal cancer Neg Hx     Social History:  reports that he has been smoking cigarettes. He has a 30.00 pack-year smoking history. He has quit using smokeless tobacco. He reports current alcohol use. He reports current drug use. Drug: Marijuana.  Allergies: No Known Allergies  Medications: I have reviewed the patient's current medications. Prior to Admission:  Medications Prior to Admission  Medication Sig Dispense Refill Last Dose   furosemide (LASIX) 40 MG tablet Take 1 tablet (40 mg total) by mouth daily. 30 tablet 3 11/29/2022   gabapentin (NEURONTIN) 300 MG capsule Take 1 capsule (300 mg total) by mouth 3 (three) times daily. For withdrawals 90 capsule 1 11/27/2022  at ran out   ondansetron (ZOFRAN-ODT) 4 MG disintegrating tablet Take 1 tablet (4 mg total) by mouth every 8 (eight) hours as needed for nausea or vomiting. 20 tablet 0 Past Week   PARoxetine (PAXIL) 20 MG tablet Take 1 tablet (20 mg total) by mouth daily. 30 tablet 1 11/29/2022   QUEtiapine (SEROQUEL) 100 MG tablet Take 1 tablet (100 mg total) by mouth at bedtime. 30 tablet 1 11/27/2022 at ran out   spironolactone (ALDACTONE) 50 MG tablet Take 1 tablet (50 mg total) by mouth in the morning. 30 tablet 3 11/29/2022   traZODone (DESYREL) 100 MG tablet Take 1 tablet (100 mg total) by mouth at bedtime. 30 tablet 2 11/29/2022   potassium chloride SA (KLOR-CON M) 20 MEQ tablet Take 1 tablet (20 mEq total) by mouth 2 (two) times daily for 2 days. (Patient not taking: Reported on  08/05/2022) 4 tablet 0 Not Taking   Scheduled:  folic acid  1 mg Oral Daily   gabapentin  300 mg Oral TID   multivitamin with minerals  1 tablet Oral Daily   PARoxetine  20 mg Oral Daily   QUEtiapine  100 mg Oral QHS   thiamine  100 mg Oral Daily   Or   thiamine  100 mg Intravenous Daily   traZODone  100 mg Oral QHS   Continuous: ZOX:[id number] **OR** LORazepam, ondansetron **OR** ondansetron (ZOFRAN) IV Anti-infectives (From admission, onward)    None        Results for orders placed or performed during the hospital encounter of 12/01/22 (from the past 48 hour(s))  Hemoglobin and hematocrit, blood     Status: Abnormal   Collection Time: 12/02/22  6:56 PM  Result Value Ref Range   Hemoglobin 8.6 (L) 13.0 - 17.0 g/dL   HCT 81.1 (L) 91.4 - 78.2 %    Comment: Performed at [facility], [address] [place], [place] [address]  Hemoglobin and hematocrit, blood     Status: Abnormal   Collection Time: 12/03/22  2:25 AM  Result Value Ref Range   Hemoglobin 8.4 (L) 13.0 - 17.0 g/dL   HCT 30.8 (L) 65.7 - 84.6 %    Comment: Performed at [facility], [address] [place], [place] [address]  CBC     Status: Abnormal   Collection Time: 12/03/22  9:07 AM  Result Value Ref Range   WBC 9.8 4.0 - 10.5 K/uL   RBC 2.63 (L) 4.22 - 5.81 MIL/uL   Hemoglobin 8.6 (L) 13.0 - 17.0 g/dL   HCT 28.4 (L) 13.2 - 44.0 %   MCV 95.8 80.0 - 100.0 fL    Comment: POST TRANSFUSION SPECIMEN REPEATED TO VERIFY    MCH 32.7 26.0 - 34.0 pg   MCHC 34.1 30.0 - 36.0 g/dL   RDW 10.2 (H) 72.5 - 36.6 %   Platelets 123 (L) 150 - 400 K/uL    Comment: REPEATED TO VERIFY   nRBC 0.8 (H) 0.0 - 0.2 %    Comment: Performed at [facility], [address], [place], [place] [address]  Hemoglobin and hematocrit, blood     Status: Abnormal   Collection Time: 12/03/22  3:11 PM  Result Value Ref Range   Hemoglobin 8.8 (L) 13.0 - 17.0 g/dL   HCT 74.2 (L) 59.5 - 63.8 %    Comment: Performed at [facility], [address], [place], [place] [address]  Hemoglobin and hematocrit, blood  Status: Abnormal   Collection Time: 12/03/22  8:52 PM  Result Value Ref Range   Hemoglobin 8.7 (L) 13.0 - 17.0 g/dL   HCT 67.3 (L) 41.9 - 37.9 %    Comment: Performed at [facility], [address], [place], [place] [address]  Hemoglobin and hematocrit, blood     Status: Abnormal   Collection Time: 12/03/22 11:44 PM  Result Value Ref Range   Hemoglobin 8.5 (L) 13.0 - 17.0 g/dL   HCT 73.5 (L) 32.9 - 92.4 %    Comment: Performed at [facility], [address], [place], [place] [address]  Hemoglobin and hematocrit, blood     Status: Abnormal   Collection Time: 12/04/22  6:19 AM  Result Value Ref Range   Hemoglobin 8.7 (L) 13.0 - 17.0 g/dL   HCT 19.6 (L) 22.2 - 97.9 %    Comment: Performed at [facility], [address], [place], [place] [address]  Basic metabolic panel     Status: Abnormal   Collection Time: 12/04/22  6:19 AM  Result Value Ref Range   Sodium 135 135 - 145 mmol/L   Potassium 4.0 3.5 - 5.1 mmol/L   Chloride 101 98 - 111 mmol/L   CO2 26 22 - 32 mmol/L   Glucose, Bld 142 (H) 70 - 99 mg/dL    Comment: Glucose reference range applies only to samples taken after fasting for at least 8 hours.   BUN 9 6 - 20 mg/dL   Creatinine, Ser 9.41 0.61 - 1.24 mg/dL   Calcium 8.4 (L) 8.9 - 10.3 mg/dL   GFR, Estimated >74 >08 mL/min    Comment: (NOTE) Calculated using the CKD-EPI Creatinine Equation (2021)    Anion gap 8 5 - 15    Comment: Performed at [facility], [address], [place], [place] [address]  Resp panel by RT-PCR (RSV, Flu A&B, Covid) Anterior Nasal Swab     Status: None   Collection Time: 12/04/22 12:06 PM   Specimen: Anterior Nasal Swab  Result Value Ref Range   SARS Coronavirus 2 by RT PCR NEGATIVE NEGATIVE    Comment: (NOTE) SARS-CoV-2 target nucleic acids are NOT DETECTED.  The SARS-CoV-2 RNA is generally detectable in upper  respiratory specimens during the acute phase of infection. The lowest concentration of SARS-CoV-2 viral copies this assay can detect is 138 copies/mL. A negative result does not preclude SARS-Cov-2 infection and should not be used as the sole basis for treatment or other patient management decisions. A negative result may occur with  improper specimen collection/handling, submission of specimen other than nasopharyngeal swab, presence of viral mutation(s) within the areas targeted by this assay, and inadequate number of viral copies(<138 copies/mL). A negative result must be combined with clinical observations, patient history, and epidemiological information. The expected result is Negative.  Fact Sheet for Patients:  BloggerCourse.com  Fact Sheet for Healthcare Providers:  SeriousBroker.it  This test is no t yet approved or cleared by the [place] FDA and  has been authorized for detection and/or diagnosis of SARS-CoV-2 by FDA under an Emergency Use Authorization (EUA). This EUA will remain  in effect (meaning this test can be used) for the duration of the COVID-19 declaration under Section 564(b)(1) of the Act, 21 U.S.C.section 360bbb-3(b)(1), unless the authorization is terminated  or revoked sooner.       Influenza A by PCR NEGATIVE NEGATIVE   Influenza B by PCR NEGATIVE NEGATIVE  Comment: (NOTE) The Xpert Xpress SARS-CoV-2/FLU/RSV plus assay is intended as an aid in the diagnosis of influenza from Nasopharyngeal swab specimens and should not be used as a sole basis for treatment. Nasal washings and aspirates are unacceptable for Xpert Xpress SARS-CoV-2/FLU/RSV testing.  Fact Sheet for Patients: BloggerCourse.comhttps://www.fda.gov/media/152166/download  Fact Sheet for Healthcare Providers: SeriousBroker.ithttps://www.fda.gov/media/152162/download  This test is not yet approved or cleared by the [place] FDA and has been authorized for  detection and/or diagnosis of SARS-CoV-2 by FDA under an Emergency Use Authorization (EUA). This EUA will remain in effect (meaning this test can be used) for the duration of the COVID-19 declaration under Section 564(b)(1) of the Act, 21 U.S.C. section 360bbb-3(b)(1), unless the authorization is terminated or revoked.     Resp Syncytial Virus by PCR NEGATIVE NEGATIVE    Comment: (NOTE) Fact Sheet for Patients: BloggerCourse.comhttps://www.fda.gov/media/152166/download  Fact Sheet for Healthcare Providers: SeriousBroker.ithttps://www.fda.gov/media/152162/download  This test is not yet approved or cleared by the [place] FDA and has been authorized for detection and/or diagnosis of SARS-CoV-2 by FDA under an Emergency Use Authorization (EUA). This EUA will remain in effect (meaning this test can be used) for the duration of the COVID-19 declaration under Section 564(b)(1) of the Act, 21 U.S.C. section 360bbb-3(b)(1), unless the authorization is terminated or revoked.  Performed at [facility], [address] [place], [place] [address]   Respiratory (~20 pathogens) panel by PCR     Status: None   Collection Time: 12/04/22 12:06 PM   Specimen: Nasopharyngeal Swab; Respiratory  Result Value Ref Range   Adenovirus NOT DETECTED NOT DETECTED   Coronavirus 229E NOT DETECTED NOT DETECTED    Comment: (NOTE) The Coronavirus on the Respiratory Panel, DOES NOT test for the novel  Coronavirus (2019 nCoV)    Coronavirus HKU1 NOT DETECTED NOT DETECTED   Coronavirus NL63 NOT DETECTED NOT DETECTED   Coronavirus OC43 NOT DETECTED NOT DETECTED   Metapneumovirus NOT DETECTED NOT DETECTED   Rhinovirus / Enterovirus NOT DETECTED NOT DETECTED   Influenza A NOT DETECTED NOT DETECTED   Influenza B NOT DETECTED NOT DETECTED   Parainfluenza Virus 1 NOT DETECTED NOT DETECTED   Parainfluenza Virus 2 NOT DETECTED NOT DETECTED   Parainfluenza Virus 3 NOT DETECTED NOT DETECTED   Parainfluenza Virus 4 NOT DETECTED NOT  DETECTED   Respiratory Syncytial Virus NOT DETECTED NOT DETECTED   Bordetella pertussis NOT DETECTED NOT DETECTED   Bordetella Parapertussis NOT DETECTED NOT DETECTED   Chlamydophila pneumoniae NOT DETECTED NOT DETECTED   Mycoplasma pneumoniae NOT DETECTED NOT DETECTED    Comment: Performed at [facility], [address] [place], [place] [address]    DG CHEST PORT 1 VIEW  Result Date: 12/04/2022 CLINICAL DATA:  Hypoxia EXAM: PORTABLE CHEST 1 VIEW COMPARISON:  Previous chest radiograph done on 02/21/2022, CT chest done on 12/01/2022 FINDINGS: Cardiac size is within normal limits. There are no signs of pulmonary edema. There are linear densities in both lower lung fields. There is no focal consolidation. There is no pleural effusion or pneumothorax. IMPRESSION: Increased markings are seen in both lower lung fields suggesting subsegmental atelectasis/pneumonia. Electronically Signed   By: Ernie AvenaPalani  Rathinasamy M.D.   On: 12/04/2022 10:52    ROS: As above Blood pressure (!) 120/57, pulse (!) 107, temperature 98.7 F (37.1 C), temperature source Oral, resp. rate 18, height 5\' 9"  (1.753 m), weight 79.4 kg, SpO2 97 %. Estimated body mass index is 25.84 kg/m as calculated from the following:  Height as of this encounter: 5\' 9"  (1.753 m).   Weight as of this encounter: 79.4 kg.  Physical Exam  General :  And alert and pleasant 56 year old white male in no apparent distress.  Thorax:  Ecchymosis   Extremities:  Ecchymosis most prominent in his right upper extremity  Back exam:  Faber's testing and straight leg raise testing are negative bilaterally.  Neurologic exam:  The patient is alert and oriented.  Cranial nerves 2-12 for grossly normal bilaterally.  The patient's motor strength is normal in all 4 extremities except he has 0/5 right EHL,dorsiflexor strength, and foot everter strength.  He is able to invert his right foot but does have weakness there as well.  Imaging studies: I  reviewed the patient's lumbar MRI performed at [facility] on 12/02/2022.  The patient has a transitional vertebrae which the radiologist have called S1.  The patient has multilevel disc degeneration.  He does small bulging/herniated disc at L5-S1 centrally and to the right.  Assessment/Plan:   Right footdrop:  I have discussed situation with the patient.  I have told him I think his symptoms more consistent with a peroneal neuropathy based on the mechanism of his injury and his physical exam.  Less likely is a L5 radiculopathy.    I which suggest outpatient follow-up  with neurology or physiatry for right lower extremity NCV EMG to help 12/04/2022 sort out a peroneal neuropathy versus L5 radiculopathy.  Either way I do not think  that he would likely benefit from a peroneal nerve exploration or back surgery since his symptoms have been present for over 4 months.  I will be glad to see him back in the office after he gets lower extremity NCV EMGs. [place] 12/04/2022, 5:56 PM

## 2022-12-05 DIAGNOSIS — F17210 Nicotine dependence, cigarettes, uncomplicated: Secondary | ICD-10-CM | POA: Diagnosis present

## 2022-12-05 DIAGNOSIS — G8929 Other chronic pain: Secondary | ICD-10-CM | POA: Diagnosis present

## 2022-12-05 DIAGNOSIS — I2510 Atherosclerotic heart disease of native coronary artery without angina pectoris: Secondary | ICD-10-CM | POA: Diagnosis present

## 2022-12-05 DIAGNOSIS — W19XXXA Unspecified fall, initial encounter: Secondary | ICD-10-CM | POA: Diagnosis present

## 2022-12-05 DIAGNOSIS — R0902 Hypoxemia: Secondary | ICD-10-CM | POA: Diagnosis present

## 2022-12-05 DIAGNOSIS — Z9081 Acquired absence of spleen: Secondary | ICD-10-CM | POA: Diagnosis not present

## 2022-12-05 DIAGNOSIS — F1090 Alcohol use, unspecified, uncomplicated: Secondary | ICD-10-CM | POA: Diagnosis not present

## 2022-12-05 DIAGNOSIS — K7210 Chronic hepatic failure without coma: Secondary | ICD-10-CM | POA: Diagnosis present

## 2022-12-05 DIAGNOSIS — Y9301 Activity, walking, marching and hiking: Secondary | ICD-10-CM | POA: Diagnosis present

## 2022-12-05 DIAGNOSIS — S20211A Contusion of right front wall of thorax, initial encounter: Secondary | ICD-10-CM | POA: Diagnosis present

## 2022-12-05 DIAGNOSIS — D696 Thrombocytopenia, unspecified: Secondary | ICD-10-CM | POA: Diagnosis present

## 2022-12-05 DIAGNOSIS — D649 Anemia, unspecified: Secondary | ICD-10-CM | POA: Diagnosis present

## 2022-12-05 DIAGNOSIS — K766 Portal hypertension: Secondary | ICD-10-CM | POA: Diagnosis present

## 2022-12-05 DIAGNOSIS — Z597 Insufficient social insurance and welfare support: Secondary | ICD-10-CM | POA: Diagnosis not present

## 2022-12-05 DIAGNOSIS — Z1152 Encounter for screening for COVID-19: Secondary | ICD-10-CM | POA: Diagnosis not present

## 2022-12-05 DIAGNOSIS — G589 Mononeuropathy, unspecified: Secondary | ICD-10-CM | POA: Diagnosis present

## 2022-12-05 DIAGNOSIS — F1010 Alcohol abuse, uncomplicated: Secondary | ICD-10-CM | POA: Diagnosis present

## 2022-12-05 DIAGNOSIS — Z8249 Family history of ischemic heart disease and other diseases of the circulatory system: Secondary | ICD-10-CM | POA: Diagnosis not present

## 2022-12-05 DIAGNOSIS — J189 Pneumonia, unspecified organism: Secondary | ICD-10-CM

## 2022-12-05 DIAGNOSIS — K7031 Alcoholic cirrhosis of liver with ascites: Secondary | ICD-10-CM | POA: Diagnosis present

## 2022-12-05 DIAGNOSIS — R569 Unspecified convulsions: Secondary | ICD-10-CM | POA: Diagnosis present

## 2022-12-05 DIAGNOSIS — K859 Acute pancreatitis without necrosis or infection, unspecified: Secondary | ICD-10-CM | POA: Diagnosis present

## 2022-12-05 DIAGNOSIS — D62 Acute posthemorrhagic anemia: Secondary | ICD-10-CM | POA: Diagnosis present

## 2022-12-05 DIAGNOSIS — F32A Depression, unspecified: Secondary | ICD-10-CM | POA: Diagnosis present

## 2022-12-05 DIAGNOSIS — N182 Chronic kidney disease, stage 2 (mild): Secondary | ICD-10-CM | POA: Diagnosis present

## 2022-12-05 DIAGNOSIS — D689 Coagulation defect, unspecified: Secondary | ICD-10-CM | POA: Diagnosis present

## 2022-12-05 DIAGNOSIS — E871 Hypo-osmolality and hyponatremia: Secondary | ICD-10-CM | POA: Diagnosis present

## 2022-12-05 DIAGNOSIS — M21371 Foot drop, right foot: Secondary | ICD-10-CM | POA: Diagnosis present

## 2022-12-05 LAB — CBC
HCT: 25.7 % — ABNORMAL LOW (ref 39.0–52.0)
Hemoglobin: 8.6 g/dL — ABNORMAL LOW (ref 13.0–17.0)
MCH: 33.9 pg (ref 26.0–34.0)
MCHC: 33.5 g/dL (ref 30.0–36.0)
MCV: 101.2 fL — ABNORMAL HIGH (ref 80.0–100.0)
Platelets: 124 10*3/uL — ABNORMAL LOW (ref 150–400)
RBC: 2.54 MIL/uL — ABNORMAL LOW (ref 4.22–5.81)
RDW: 25.4 % — ABNORMAL HIGH (ref 11.5–15.5)
WBC: 12.5 10*3/uL — ABNORMAL HIGH (ref 4.0–10.5)
nRBC: 0.6 % — ABNORMAL HIGH (ref 0.0–0.2)

## 2022-12-05 LAB — PROCALCITONIN: Procalcitonin: <0.1 ng/mL

## 2022-12-05 MED ORDER — AZITHROMYCIN 500 MG PO TABS
500.0000 mg | ORAL_TABLET | Freq: Every day | ORAL | Status: DC
  Administered 2022-12-05 – 2022-12-06 (×2): 500 mg via ORAL
  Filled 2022-12-05 (×2): qty 1

## 2022-12-05 MED ORDER — SODIUM CHLORIDE 0.9 % IV SOLN
2.0000 g | Freq: Every day | INTRAVENOUS | Status: DC
  Administered 2022-12-05 – 2022-12-06 (×2): 2 g via INTRAVENOUS
  Filled 2022-12-05 (×2): qty 20

## 2022-12-05 NOTE — Progress Notes (Signed)
Physical Therapy Treatment Patient Details Name: Nathaniel Warren MRN: [id number] DOB: 1966/11/04 Today's Date: 12/05/2022   History of Present Illness 56 yo male with onset of chest wall bruising with no reported trauma was admitted 12/25, now referred to PT.  Pt had a fall several months ago with RLE weakness, pain and sensory loss; now is unable to afford ortho follow up.  Has been on crutches, has R foot drop and cannot wb even on walker now.  PMHx:  etoh abuse, depression, stroke, seizures, liver failure, R ankle fracture with ORIF, atherosclerosis, cirrhosis, diverticulosis, osteopenia    PT Comments    Great session, many questions answered. Pt comfortable with RW use, ambulating and transferring at mod I level. Declines stair training, states he has been using RW to navigate steps at home. Provided toe-lift for drop foot. Will likely benefit from Rt AFO on outpatient basis as pt reports no improvement in >4 months. SpO2 96% on room air while ambulating with RW. Adequate for d/c from PT standpoint when medically ready. Agreeable to OPPT follow-up. Will continue to follow and progress during admission.  Recommendations for follow up therapy are one component of a multi-disciplinary discharge planning process, led by the attending physician.  Recommendations may be updated based on patient status, additional functional criteria and insurance authorization.  Follow Up Recommendations  Outpatient PT     Assistance Recommended at Discharge Set up Supervision/Assistance  Patient Nathaniel return home with the following A little help with walking and/or transfers;Assistance with cooking/housework;Assist for transportation;Help with stairs or ramp for entrance   Equipment Recommendations  Rolling walker (2 wheels)    Recommendations for Other Services       Precautions / Restrictions Precautions Precautions: Fall Precaution Comments: poor  WB on RLE Restrictions Weight Bearing Restrictions:  No Other Position/Activity Restrictions: foot drop on RLE     Mobility  Bed Mobility Overal bed mobility: Modified Independent                  Transfers Overall transfer level: Modified independent Equipment used: Rolling walker (2 wheels)               General transfer comment: no cues needed    Ambulation/Gait Ambulation/Gait assistance: Modified independent (Device/Increase time) Gait Distance (Feet): 100 Feet Assistive device: Rolling walker (2 wheels) Gait Pattern/deviations: Step-to pattern, Decreased stride length, Decreased dorsiflexion - right, Decreased weight shift to right Gait velocity: reduced Gait velocity interpretation: <1.31 ft/sec, indicative of household ambulator   General Gait Details: Rt foot drop. Cues for sequencing and symmetry with RW, progressed from supervision to Mod I level. SpO2 96% on RA. Good control of RW. Cues for Rt heel strike or foot flat as able at initial contact of gait cycle on Rt, walking with PF on forefoot but developed foot flat with progression.   Stairs Stairs:  (Declines, states he has been using a walker for stairs at home, does not need to practice.)           Wheelchair Mobility    Modified Rankin (Stroke Patients Only)       Balance Overall balance assessment: Needs assistance, History of Falls Sitting-balance support: Feet supported Sitting balance-Leahy Scale: Good     Standing balance support: Bilateral upper extremity supported, During functional activity Standing balance-Leahy Scale: Poor                              Cognition  Arousal/Alertness: Awake/alert Behavior During Therapy: WFL for tasks assessed/performed Overall Cognitive Status: Within Functional Limits for tasks assessed                                          Exercises      General Comments General comments (skin integrity, edema, etc.): After session, toe lift velcro was provided,  demonstrated, and set-up with instructions for use. Handout provided. Not billed for second visit or device.      Pertinent Vitals/Pain Pain Assessment Pain Assessment: Faces Faces Pain Scale: Hurts little more Pain Location: RLE Pain Descriptors / Indicators: Guarding, Grimacing, Aching, Sore, Tingling Pain Intervention(s): Monitored during session, Repositioned    Home Living                          Prior Function            PT Goals (current goals Nathaniel now be found in the care plan section) Acute Rehab PT Goals Patient Stated Goal: to hurt less, fix numbness and get home PT Goal Formulation: With patient Time For Goal Achievement: 12/17/22 Potential to Achieve Goals: Good Progress towards PT goals: Progressing toward goals    Frequency    Min 3X/week      PT Plan Current plan remains appropriate    Co-evaluation              AM-PAC PT "6 Clicks" Mobility   Outcome Measure  Help needed turning from your back to your side while in a flat bed without using bedrails?: None Help needed moving from lying on your back to sitting on the side of a flat bed without using bedrails?: None Help needed moving to and from a bed to a chair (including a wheelchair)?: None Help needed standing up from a chair using your arms (e.g., wheelchair or bedside chair)?: None Help needed to walk in hospital room?: None Help needed climbing 3-5 steps with a railing? : A Little 6 Click Score: 23    End of Session Equipment Utilized During Treatment: Gait belt Activity Tolerance: Patient tolerated treatment well Patient left: in bed;with call bell/phone within reach;with bed alarm set Nurse Communication: Mobility status PT Visit Diagnosis: Unsteadiness on feet (R26.81);Muscle weakness (generalized) (M62.81);History of falling (Z91.81);Difficulty in walking, not elsewhere classified (R26.2);Pain Pain - Right/Left: Right Pain - part of body: Leg;Ankle and joints of foot      Time: 1053-1130 PT Time Calculation (min) (ACUTE ONLY): 37 min  Charges:  $Therapeutic Activity: 8-22 mins $Self Care/Home Management: 8-22                     Kathlyn Sacramento, PT, DPT Physical Therapist Acute Rehabilitation Services [facility] Outpatient Rehabilitation Services [facility] 12/05/2022, 12:27 PM

## 2022-12-05 NOTE — Progress Notes (Signed)
PROGRESS NOTE    Nathaniel Warren  XBW:[id number] DOB: August 31, 1966 DOA: 12/01/2022 PCP: Ivonne Andrew, NP   Brief Narrative: Nathaniel Warren is a 56 y.o. male with a history of alcohol abuse, cirrhosis, depression. Patient presented secondary to right arm pain/bruising and was found to have a chest wall hematoma with associated profound anemia requiring blood transfusion. While admitted, patient was found to have right lower extremity weakness with MRI findings consistent with herniated disc with S1 nerve compression. He then developed hypoxia requiring supplemental oxygen.   Assessment and Plan: * Chest wall hematoma Right sided. Secondary to trauma from crutch use. Associated blood loss anemia with significant anemia. Trauma surgery consulted in the ED, without recommendations. Appears stable.  ABLA (acute blood loss anemia) Secondary to acute bleed and chest wall hematoma. Hemoglobin of 4.2 on admission. Associated INR of 2.1. Platelets 144 > 123, although patient has chronic thrombocytopenia. Patient transfused 2 units of PRBC with post-transfusion hemoglobin of 9.0. Hemoglobin has remained stable.  Alcoholic cirrhosis of liver with ascites (HCC) Patient is on Lasix and aldactone as an outpatient, which were held on admission secondary to hypotension from acute blood loss anemia. Blood pressure has remained slightly low-normal.  Chronic pain of right ankle Secondary to fall. No fractures.  Alcohol use disorder -Continue CIWA protocol  Ground glass opacity present on imaging of lung Present on CT imaging, involving medial lingula, infrahilar right lower lobe, and infrahilar right middle lobe. Initial recommendations for repeat CT imaging in 3-4 weeks to ensure resolution of ground-glass opacities. Now, patient appears to be showing signs of acute illness which may explain findings. Treating pneumonia. Patient will still need repeat imaging to ensure  resolution.  Hypoxia Initially, unclear etiology but concern for viral illness. Associated cough. No phlegm production, although patient feels congested. 4 L/min of supplemental oxygen applied for SpO2 of 85% on room air. Influenza, COVID-19 and RVP negative. Leukocytosis today. Chest x-ray suggests development of infiltrate. -Start Ceftriaxone/Azithromycin for empiric treatment of pneumonia.  Nerve compression MRI (12/26) significant for L5-S1 right-sided disc herniation with associated stenosis of the subarticular lateral recess and S1 nerve compression/displacement. Associated RLE radicular symptoms. Patient seen by neurosurgery who recommends NCV EMG of right lower extremity as an outpatient.  Thrombocytopenia (HCC) Likely related to liver disease. Prior baseline platelet count of about 70-80,000. Platelets of 144,000 on admission, down to 123,000. Stable.    DVT prophylaxis: SCDs Code Status:   Code Status: Full Code Family Communication: None at bedside Disposition Plan: Discharge home possible in 1 day pending ability to wean oxygen to room air, ambulatory pulse ox check, transition to oral antibiotics   Consultants:  Neurosurgery, Dr. Lovell Sheehan  Procedures:  None  Antimicrobials: Ceftriaxone Azithromycin    Subjective: Patient with improvement in shortness of breath. Still with cough. Afebrile. No chest pain.  Objective: BP (!) 103/57 (BP Location: Left Arm)   Pulse 77   Temp 99 F (37.2 C) (Oral)   Resp 18   Ht 5\' 9"  (1.753 m)   Wt 79.4 kg   SpO2 95%   BMI 25.84 kg/m   Examination:  General exam: Appears calm and comfortable Respiratory system: Diminished bilaterally. Respiratory effort normal. Cardiovascular system: S1 & S2 heard, RRR. Gastrointestinal system: Abdomen is nondistended, soft and nontender. No organomegaly or masses felt. Normal bowel sounds heard. Central nervous system: Alert and oriented. Musculoskeletal: No edema. No calf  tenderness Psychiatry: Judgement and insight appear normal. Mood & affect appropriate.  Data Reviewed: I have personally reviewed following labs and imaging studies  CBC Lab Results  Component Value Date   WBC 12.5 (H) 12/05/2022   RBC 2.54 (L) 12/05/2022   HGB 8.6 (L) 12/05/2022   HCT 25.7 (L) 12/05/2022   MCV 101.2 (H) 12/05/2022   MCH 33.9 12/05/2022   PLT 124 (L) 12/05/2022   MCHC 33.5 12/05/2022   RDW 25.4 (H) 12/05/2022   LYMPHSABS 4.4 (H) 12/01/2022   MONOABS 2.5 (H) 12/01/2022   EOSABS 0.1 12/01/2022   BASOSABS 0.0 12/01/2022     Last metabolic panel Lab Results  Component Value Date   NA 135 12/04/2022   K 4.0 12/04/2022   CL 101 12/04/2022   CO2 26 12/04/2022   BUN 9 12/04/2022   CREATININE 1.03 12/04/2022   GLUCOSE 142 (H) 12/04/2022   GFRNONAA >60 12/04/2022   GFRAA >60 01/26/2018   CALCIUM 8.4 (L) 12/04/2022   PHOS 3.5 06/18/2022   PROT 6.2 (L) 12/02/2022   ALBUMIN 2.5 (L) 12/02/2022   BILITOT 9.9 (H) 12/02/2022   ALKPHOS 101 12/02/2022   AST 88 (H) 12/02/2022   ALT 33 12/02/2022   ANIONGAP 8 12/04/2022    GFR: Estimated Creatinine Clearance: 82 mL/min (by C-G formula based on SCr of 1.03 mg/dL).  Recent Results (from the past 240 hour(s))  Resp panel by RT-PCR (RSV, Flu A&B, Covid) Anterior Nasal Swab     Status: None   Collection Time: 12/04/22 12:06 PM   Specimen: Anterior Nasal Swab  Result Value Ref Range Status   SARS Coronavirus 2 by RT PCR NEGATIVE NEGATIVE Final    Comment: (NOTE) SARS-CoV-2 target nucleic acids are NOT DETECTED.  The SARS-CoV-2 RNA is generally detectable in upper respiratory specimens during the acute phase of infection. The lowest concentration of SARS-CoV-2 viral copies this assay can detect is 138 copies/mL. A negative result does not preclude SARS-Cov-2 infection and should not be used as the sole basis for treatment or other patient management decisions. A negative result may occur with  improper  specimen collection/handling, submission of specimen other than nasopharyngeal swab, presence of viral mutation(s) within the areas targeted by this assay, and inadequate number of viral copies(<138 copies/mL). A negative result must be combined with clinical observations, patient history, and epidemiological information. The expected result is Negative.  Fact Sheet for Patients:  BloggerCourse.com  Fact Sheet for Healthcare Providers:  SeriousBroker.it  This test is no t yet approved or cleared by the [place] FDA and  has been authorized for detection and/or diagnosis of SARS-CoV-2 by FDA under an Emergency Use Authorization (EUA). This EUA will remain  in effect (meaning this test can be used) for the duration of the COVID-19 declaration under Section 564(b)(1) of the Act, 21 U.S.C.section 360bbb-3(b)(1), unless the authorization is terminated  or revoked sooner.       Influenza A by PCR NEGATIVE NEGATIVE Final   Influenza B by PCR NEGATIVE NEGATIVE Final    Comment: (NOTE) The Xpert Xpress SARS-CoV-2/FLU/RSV plus assay is intended as an aid in the diagnosis of influenza from Nasopharyngeal swab specimens and should not be used as a sole basis for treatment. Nasal washings and aspirates are unacceptable for Xpert Xpress SARS-CoV-2/FLU/RSV testing.  Fact Sheet for Patients: BloggerCourse.com  Fact Sheet for Healthcare Providers: SeriousBroker.it  This test is not yet approved or cleared by the [place] FDA and has been authorized for detection and/or diagnosis of SARS-CoV-2 by FDA under an Emergency Use Authorization (EUA). This  EUA will remain in effect (meaning this test can be used) for the duration of the COVID-19 declaration under Section 564(b)(1) of the Act, 21 U.S.C. section 360bbb-3(b)(1), unless the authorization is terminated or revoked.     Resp  Syncytial Virus by PCR NEGATIVE NEGATIVE Final    Comment: (NOTE) Fact Sheet for Patients: BloggerCourse.com  Fact Sheet for Healthcare Providers: SeriousBroker.it  This test is not yet approved or cleared by the [place] FDA and has been authorized for detection and/or diagnosis of SARS-CoV-2 by FDA under an Emergency Use Authorization (EUA). This EUA will remain in effect (meaning this test can be used) for the duration of the COVID-19 declaration under Section 564(b)(1) of the Act, 21 U.S.C. section 360bbb-3(b)(1), unless the authorization is terminated or revoked.  Performed at [facility], [address] [place], [place] [address]   Respiratory (~20 pathogens) panel by PCR     Status: None   Collection Time: 12/04/22 12:06 PM   Specimen: Nasopharyngeal Swab; Respiratory  Result Value Ref Range Status   Adenovirus NOT DETECTED NOT DETECTED Final   Coronavirus 229E NOT DETECTED NOT DETECTED Final    Comment: (NOTE) The Coronavirus on the Respiratory Panel, DOES NOT test for the novel  Coronavirus (2019 nCoV)    Coronavirus HKU1 NOT DETECTED NOT DETECTED Final   Coronavirus NL63 NOT DETECTED NOT DETECTED Final   Coronavirus OC43 NOT DETECTED NOT DETECTED Final   Metapneumovirus NOT DETECTED NOT DETECTED Final   Rhinovirus / Enterovirus NOT DETECTED NOT DETECTED Final   Influenza A NOT DETECTED NOT DETECTED Final   Influenza B NOT DETECTED NOT DETECTED Final   Parainfluenza Virus 1 NOT DETECTED NOT DETECTED Final   Parainfluenza Virus 2 NOT DETECTED NOT DETECTED Final   Parainfluenza Virus 3 NOT DETECTED NOT DETECTED Final   Parainfluenza Virus 4 NOT DETECTED NOT DETECTED Final   Respiratory Syncytial Virus NOT DETECTED NOT DETECTED Final   Bordetella pertussis NOT DETECTED NOT DETECTED Final   Bordetella Parapertussis NOT DETECTED NOT DETECTED Final   Chlamydophila pneumoniae NOT DETECTED NOT DETECTED Final    Mycoplasma pneumoniae NOT DETECTED NOT DETECTED Final    Comment: Performed at [facility], [address], [place], [place] [address]      Radiology Studies: DG CHEST PORT 1 VIEW  Result Date: 12/04/2022 CLINICAL DATA:  Hypoxia EXAM: PORTABLE CHEST 1 VIEW COMPARISON:  Previous chest radiograph done on 02/21/2022, CT chest done on 12/01/2022 FINDINGS: Cardiac size is within normal limits. There are no signs of pulmonary edema. There are linear densities in both lower lung fields. There is no focal consolidation. There is no pleural effusion or pneumothorax. IMPRESSION: Increased markings are seen in both lower lung fields suggesting subsegmental atelectasis/pneumonia. Electronically Signed   By: Ernie Avena M.D.   On: 12/04/2022 10:52      LOS: 0 days    Jacquelin Hawking, MD [facility] 12/05/2022, 12:16 PM   If 7PM-7AM, please contact night-coverage [email or web address]

## 2022-12-06 DIAGNOSIS — M21371 Foot drop, right foot: Secondary | ICD-10-CM | POA: Insufficient documentation

## 2022-12-06 MED ORDER — CEFDINIR 300 MG PO CAPS: 300.0000 mg | ORAL_CAPSULE | Freq: Two times a day (BID) | ORAL | 0 refills | Status: AC

## 2022-12-06 MED ORDER — AZITHROMYCIN 500 MG PO TABS: 500.0000 mg | ORAL_TABLET | Freq: Every day | ORAL | 0 refills | Status: AC

## 2022-12-06 MED ORDER — ORAL CARE MOUTH RINSE: 15.0000 mL | OROMUCOSAL | Status: DC | PRN

## 2022-12-06 NOTE — TOC Transition Note (Signed)
Transition of Care [facility]) - CM/SW Discharge Note   Patient Details  Name: Nathaniel Warren MRN: [id number] Date of Birth: 05-21-1966  [facility] CM/SW Contact:  Bess Kinds, RN Phone Number: [phone] 12/06/2022, 9:04 AM   Clinical Narrative:     Patient to transition from hospital today. Previous RNCM arranged RW via charity care. Confirmed with Jasmine at [employer] that RW delivered 12/28 @ 10am. No further TOC needs identified at this time.   Final next level of care: Home/Self Care Barriers to Discharge: No Barriers Identified   Patient Goals and CMS Choice      Discharge Placement                         Discharge Plan and Services Additional resources added to the After Visit Summary for     Discharge Planning Services: CM Consult            DME Arranged: Walker rolling DME Agency: [employer] Date DME Agency Contacted: 12/06/22 Time DME Agency Contacted: [phone] Representative spoke with at DME Agency: Leavy Cella            Social Determinants of Health (SDOH) Interventions SDOH Screenings   Food Insecurity: No Food Insecurity (12/02/2022)  Housing: Low Risk  (12/02/2022)  Transportation Needs: No Transportation Needs (12/02/2022)  Utilities: Not At Risk (12/02/2022)  Alcohol Screen: Medium Risk (10/27/2017)  Depression (PHQ2-9): High Risk (08/07/2022)  Financial Resource Strain: High Risk (07/15/2022)  Physical Activity: Inactive (07/15/2022)  Social Connections: Socially Isolated (07/15/2022)  Stress: Stress Concern Present (07/15/2022)  Tobacco Use: High Risk (12/02/2022)     Readmission Risk Interventions     No data to display

## 2022-12-06 NOTE — Discharge Summary (Incomplete)
Physician Discharge Summary   Patient: Nathaniel Warren MRN: [id number] DOB: 11/13/1966  Admit date:     12/01/2022  Discharge date: {dischdate:26783}  Discharge Physician: Jacquelin Hawkingalph Delorean Knutzen   PCP: Ivonne AndrewNichols, Tonya S, NP   Recommendations at discharge:  {Tip this will not be part of the note when signed- Example include specific recommendations for outpatient follow-up, pending tests to follow-up on. (Optional):26781}  ***  Discharge Diagnoses: Principal Problem:   Chest wall hematoma Active Problems:   ABLA (acute blood loss anemia)   Alcoholic cirrhosis of liver with ascites (HCC)   Alcohol use disorder   Chronic pain of right ankle   Thrombocytopenia (HCC)   Nerve compression   Hypoxia   Ground glass opacity present on imaging of lung   Foot drop, right  Resolved Problems:   * No resolved hospital problems. *  Hospital Course: Nathaniel Warren is a 56 y.o. male with a history of alcohol abuse, cirrhosis, depression. Patient presented secondary to right arm pain/bruising and was found to have a chest wall hematoma with associated profound anemia requiring blood transfusion. While admitted, patient was found to have right lower extremity weakness with MRI findings consistent with herniated disc with S1 nerve compression. He then developed hypoxia requiring supplemental oxygen.  Assessment and Plan: * Chest wall hematoma Right sided. Secondary to trauma from crutch use. Associated blood loss anemia with significant anemia. Trauma surgery consulted in the ED, without recommendations. Appears stable.  ABLA (acute blood loss anemia) Secondary to acute bleed and chest wall hematoma. Hemoglobin of 4.2 on admission. Associated INR of 2.1. Platelets 144 > 123, although patient has chronic thrombocytopenia. Patient transfused 2 units of PRBC with post-transfusion hemoglobin of 9.0. Hemoglobin has remained stable.  Alcoholic cirrhosis of liver with ascites (HCC) Patient is on Lasix and  aldactone as an outpatient, which were held on admission secondary to hypotension from acute blood loss anemia. Blood pressure has remained slightly low-normal.  Chronic pain of right ankle Secondary to fall. No fractures.  Alcohol use disorder -Continue CIWA protocol  Ground glass opacity present on imaging of lung Present on CT imaging, involving medial lingula, infrahilar right lower lobe, and infrahilar right middle lobe. Initial recommendations for repeat CT imaging in 3-4 weeks to ensure resolution of ground-glass opacities. Now, patient appears to be showing signs of acute illness which may explain findings. Treating pneumonia. Patient will still need repeat imaging to ensure resolution.  Hypoxia Initially, unclear etiology but concern for viral illness. Associated cough. No phlegm production, although patient feels congested. 4 L/min of supplemental oxygen applied for SpO2 of 85% on room air. Influenza, COVID-19 and RVP negative. Leukocytosis today. Chest x-ray suggests development of infiltrate. -Start Ceftriaxone/Azithromycin for empiric treatment of pneumonia.  Nerve compression MRI (12/26) significant for L5-S1 right-sided disc herniation with associated stenosis of the subarticular lateral recess and S1 nerve compression/displacement. Associated RLE radicular symptoms. Patient seen by neurosurgery who recommends [facility] EMG of right lower extremity as an outpatient.  Thrombocytopenia (HCC) Likely related to liver disease. Prior baseline platelet count of about 70-80,000. Platelets of 144,000 on admission, down to 123,000. Stable.      {Tip this will not be part of the note when signed Body mass index is 25.84 kg/m. , ,  (Optional):26781}  {(NOTE) Pain control PDMP Statment (Optional):26782} Consultants: *** Procedures performed: ***  Disposition: {Plan; Disposition:26390} Diet recommendation:  {Diet_Plan:26776} DISCHARGE MEDICATION: Allergies as of 12/06/2022   No Known  Allergies      Medication List  TAKE these medications    azithromycin 500 MG tablet Commonly known as: ZITHROMAX Take 1 tablet (500 mg total) by mouth daily for 3 days. Start taking on: December 07, 2022   cefdinir 300 MG capsule Commonly known as: OMNICEF Take 1 capsule (300 mg total) by mouth 2 (two) times daily for 3 days. Start taking on: December 07, 2022   furosemide 40 MG tablet Commonly known as: LASIX Take 1 tablet (40 mg total) by mouth daily.   gabapentin 300 MG capsule Commonly known as: NEURONTIN Take 1 capsule (300 mg total) by mouth 3 (three) times daily. For withdrawals   ondansetron 4 MG disintegrating tablet Commonly known as: ZOFRAN-ODT Take 1 tablet (4 mg total) by mouth every 8 (eight) hours as needed for nausea or vomiting.   PARoxetine 20 MG tablet Commonly known as: PAXIL Take 1 tablet (20 mg total) by mouth daily.   potassium chloride SA 20 MEQ tablet Commonly known as: KLOR-CON M Take 1 tablet (20 mEq total) by mouth 2 (two) times daily for 2 days.   QUEtiapine 100 MG tablet Commonly known as: SEROQUEL Take 1 tablet (100 mg total) by mouth at bedtime.   spironolactone 50 MG tablet Commonly known as: Aldactone Take 1 tablet (50 mg total) by mouth in the morning.   traZODone 100 MG tablet Commonly known as: DESYREL Take 1 tablet (100 mg total) by mouth at bedtime.               Durable Medical Equipment  (From admission, onward)           Start     Ordered   12/04/22 0918  For home use only DME Walker rolling  Once       Question Answer Comment  Walker: With 5 Inch Wheels   Patient needs a walker to treat with the following condition Weakness      12/04/22 0917            Follow-up Information     [facility] Schedule an appointment as soon as possible for a visit.   Specialty: Rehabilitation Why: Call to make apt for Physical therapy Contact information: [address] [id number] mc [address] [place] [address] [phone]        Ivonne Andrew, NP. Schedule an appointment as soon as possible for a visit in 1 week(s).   Specialties: Pulmonary Disease, Endocrinology Why: For hospital follow-up Contact information: [address] [place] [address] [phone]         [facility]. Schedule an appointment as soon as possible for a visit.   Why: NCV EMG testing Contact information: [address] [place], [address] [place] [address] [phone]        Tressie Stalker, MD Follow up.   Specialty: Neurosurgery Why: As needed if EMG testing is concerning for impingement cause ofright leg symptoms Contact information: [address] [place] [address] [phone]                Discharge Exam: Ceasar Mons Weights   12/02/22 0126  Weight: 79.4 kg   ***  Condition at discharge: {DC Condition:26389}  The results of significant diagnostics from this hospitalization (including imaging, microbiology, ancillary and laboratory) are listed below for reference.   Imaging Studies: DG CHEST PORT 1 VIEW  Result Date: 12/04/2022 CLINICAL DATA:  Hypoxia EXAM: PORTABLE CHEST 1 VIEW COMPARISON:  Previous chest radiograph done on 02/21/2022, CT chest  done on 12/01/2022 FINDINGS: Cardiac size is within normal limits. There are no signs of pulmonary edema. There are linear densities in both lower lung fields. There is no focal consolidation. There is no pleural effusion or pneumothorax. IMPRESSION: Increased markings are seen in both lower lung fields suggesting subsegmental atelectasis/pneumonia. Electronically Signed   By: Ernie Avena M.D.   On: 12/04/2022 10:52   MR LUMBAR SPINE WO CONTRAST  Result Date: 12/02/2022 CLINICAL DATA:  Lumbar radiculopathy, symptoms persist with greater than 6 weeks for treatment. Chronic cirrhosis. Right lower extremity pain with loss of  sensation. EXAM: MRI LUMBAR SPINE WITHOUT CONTRAST TECHNIQUE: Multiplanar, multisequence MR imaging of the lumbar spine was performed. No intravenous contrast was administered. COMPARISON:  Radiography 01/22/2013 FINDINGS: Segmentation:  5 lumbar type vertebral bodies. Alignment:  Minimal scoliotic curvature convex to the left. Vertebrae: No fracture or focal bone lesion. Chronic discogenic endplate marrow changes without active edema presently. Conus medullaris and cauda equina: Conus extends to the L2 level. Conus and cauda equina appear normal. Paraspinal and other soft tissues: Negative Disc levels: T12-L1: Normal. L1-2: Minimal bulging of the disc. No stenosis of the canal or foramina. L2-3: Endplate osteophytes and bulging of the disc. Stenosis of both lateral recesses that could possibly affect either L3 nerve. No central canal stenosis. L3-4: Endplate osteophytes and bulging of the disc. Mild stenosis of both lateral recesses but without likely neural compression. L4-5: Endplate osteophytes and bulging of the disc. Mild facet and ligamentous hypertrophy. Mild stenosis of both lateral recesses that could possibly affect either L5 nerve. Left foraminal encroachment by osteophyte in disc material could affect the left L4 nerve. L5-S1: Endplate osteophytes and shallow disc herniation more prominent towards the right with caudal down turning on the right. Stenosis of the subarticular lateral recess on the right with apparent compression/displacement of the right S1 nerve. Mild bilateral foraminal narrowing that could possibly affect either exiting L5 nerve. IMPRESSION: 1. L5-S1: Right-sided predominant disc herniation with caudal down turning. Stenosis of the subarticular lateral recess on the right with apparent compression/displacement of the right S1 nerve. Mild bilateral foraminal narrowing that could possibly affect either L5 nerve. 2. L4-5: Bilateral lateral recess stenosis that could affect either L5  nerve. Left foraminal encroachment by osteophyte and disc material could affect the left L4 nerve. 3. L2-3: Bilateral lateral recess stenosis that could affect either L3 nerve. 4. L3-4: Mild bilateral lateral recess stenosis but without likely neural compression. Electronically Signed   By: Paulina Fusi M.D.   On: 12/02/2022 09:52   DG Knee Complete 4 Views Right  Result Date: 12/02/2022 CLINICAL DATA:  Knee pain EXAM: RIGHT KNEE - COMPLETE 4+ VIEW COMPARISON:  Radiographs 08/03/2022 FINDINGS: No acute fracture or dislocation.  No knee joint effusion. IMPRESSION: No acute fracture. Electronically Signed   By: Minerva Fester M.D.   On: 12/02/2022 03:20   DG Ankle Complete Right  Result Date: 12/02/2022 CLINICAL DATA:  Chronic right ankle pain. EXAM: RIGHT ANKLE - COMPLETE 3+ VIEW COMPARISON:  Right foot radiograph dated 04/12/2011. FINDINGS: Prior internal fixation of the medial malleolus and distal fibula. The hardware is intact. There is no acute fracture or dislocation. The bones are osteopenic. The soft tissues are unremarkable. IMPRESSION: 1. No acute findings. 2. Prior internal fixation of the medial malleolus and distal fibula. Electronically Signed   By: Elgie Collard M.D.   On: 12/02/2022 03:20   CT CHEST ABDOMEN PELVIS W CONTRAST  Result Date: 12/02/2022 CLINICAL DATA:  Blunt  polytrauma, with bruising in the right neck and chest. [name]. States he heard a pop when using his crutches. EXAM: CT CHEST, ABDOMEN, AND PELVIS WITH CONTRAST TECHNIQUE: Multidetector CT imaging of the chest, abdomen and pelvis was performed following the standard protocol during bolus administration of intravenous contrast. RADIATION DOSE REDUCTION: This exam was performed according to the departmental dose-optimization program which includes automated exposure control, adjustment of the mA and/or kV according to patient size and/or use of iterative reconstruction technique. CONTRAST:  33mL OMNIPAQUE IOHEXOL 350  MG/ML SOLN COMPARISON:  CTA chest 08/18/2014, CT abdomen and pelvis with contrast 03/20/2022, CT abdomen and pelvis no contrast 02/21/2022. FINDINGS: CT CHEST FINDINGS Cardiovascular: The cardiac size is normal. There is three-vessel coronary artery calcification heaviest in the LAD. There is no pericardial effusion. The pulmonary veins are decompressed. The pulmonary trunk there is prominent at 3.5 cm indicating arterial hypertension, but no more dilated than previously. The pulmonary arteries are centrally clear. There is mild aortic patchy atherosclerosis. There is no aortic aneurysm, stenosis or dissection. The great vessels are patent with trace calcifications in the brachiocephalic trunk and proximal left common carotid artery. Mediastinum/Nodes: No enlarged mediastinal, hilar, or axillary lymph nodes. The lower poles of the thyroid gland, the trachea, and esophagus demonstrate no significant findings. The bilateral main bronchi are patent. Lungs/Pleura: Lung bases show posterior atelectasis. There are ground-glass opacities in the medial lingula, in the infrahilar right lower lobe medially, and in the infrahilar right middle lobe medially. In a trauma setting this could be due to pulmonary contusions but given location this is more likely due to multifocal pneumonitis. A follow-up study is recommended 3-4 weeks to ensure clearing. There is no pleural effusion, thickening or pneumothorax. Musculoskeletal: There is moderate subcutaneous chest wall stranding anteriorly and on the right. Bilateral moderate gynecomastia has developed since 2015 and appears to be the diffuse fibroglandular type. There is a heterogeneous mass in the mid to lateral right pectoralis major muscle measuring 9.6 x 4.8 x 6.6 cm. If this recently developed, it is almost certainly a hematoma. If this has been present prior to the antecedent trauma the patient presented from, other etiologies such as a mass/neoplasm could not be excluded.  There are healed fracture deformities of multiple right ribs. There are mild chronic upper plate anterior wedge compression fractures of the T4-6 vertebrae, osteopenia and degenerative change of the spine. The ribcage is intact. No primarily pathologic process of bone is seen. CT ABDOMEN PELVIS FINDINGS Hepatobiliary: There is a nodular hepatic contour consistent with cirrhosis. The main portal vein is patent and within normal caliber limits. No hepatic mass is seen. There are small stones in the distal gallbladder and dilatation of the gallbladder up to 12 cm in length. No wall thickening, pericholecystic edema or biliary dilatation is seen. Pancreas: No abnormality. Spleen: Prior splenectomy with subphrenic splenules largest is again 4.6 cm. No mass. Adrenals/Urinary Tract: There is no adrenal mass. There is no renal cortical mass. There is no urinary stone or obstruction. The bladder is nondistended although could demonstrate mild general wall thickening. Stomach/Bowel: The stomach is contracted but grossly normal. No small bowel obstruction or inflammation is seen. The appendix is normal. There is no dilatation or wall thickening in the large bowel. There are sigmoid diverticula without evidence of acute diverticulitis. Vascular/Lymphatic: There are small venous varices in the lower paraesophageal area, perigastric area and perirectal spaces, some in the upper abdominal mesentery. The main portal vein is within normal caliber limits.  There is moderate aortoiliac calcific plaque without aneurysm. No adenopathy is seen. There are mild mesenteric congestive features. Reproductive: Normal prostate. Other: Supraumbilical midline anterior wall fat hernia. There is no incarcerated hernia. There is trace presacral ascites. The earlier studies from this year demonstrated much greater ascites displacing the bowel medially. There is trace perihepatic ascites. Musculoskeletal: There is asymmetric stranding in the lateral  right abdominal wall continuing from the lower lateral right chest. There is degenerative disc disease, spondylosis and facet hypertrophy in the lumbar spine, mild hip DJD. Slight lumbar levoscoliosis. No acute or other significant osseous findings. Osteopenia. IMPRESSION: 1. 9.6 x 4.8 x 6.6 cm heterogeneous mass in the mid to lateral right pectoralis major muscle. If this developed with the recent trauma, it is almost certainly a hematoma. If this has been present prior to the antecedent trauma the patient presented from, other etiologies such as a mass/neoplasm could not be excluded. 2. There is moderate subcutaneous stranding in the anterior chest wall and right lateral chest and abdominal wall. 3. Ground-glass opacities in the medial lingula, infrahilar right lower lobe, and infrahilar right middle lobe. In a trauma setting it is possible this could be due to multifocal contusions, but given location, multifocal pneumonitis is more likely. A follow-up study is recommended 3-4 weeks to ensure clearing. 4. Prominent pulmonary trunk but no change since 2015. 5. Aortic and coronary artery atherosclerosis. 6. Cirrhotic liver with small venous varices and trace perihepatic and presacral ascites. 7. Distended gallbladder with cholelithiasis but no wall thickening or pericholecystic edema. 8. Possible cystitis versus bladder nondistention. 9. Diverticulosis without evidence of diverticulitis. 10. Supraumbilical midline anterior wall fat hernia. No incarcerated hernia. Electronically Signed   By: Almira Bar M.D.   On: 12/02/2022 00:09    Microbiology: Results for orders placed or performed during the hospital encounter of 12/01/22  Resp panel by RT-PCR (RSV, Flu A&B, Covid) Anterior Nasal Swab     Status: None   Collection Time: 12/04/22 12:06 PM   Specimen: Anterior Nasal Swab  Result Value Ref Range Status   SARS Coronavirus 2 by RT PCR NEGATIVE NEGATIVE Final    Comment: (NOTE) SARS-CoV-2 target nucleic  acids are NOT DETECTED.  The SARS-CoV-2 RNA is generally detectable in upper respiratory specimens during the acute phase of infection. The lowest concentration of SARS-CoV-2 viral copies this assay can detect is 138 copies/mL. A negative result does not preclude SARS-Cov-2 infection and should not be used as the sole basis for treatment or other patient management decisions. A negative result may occur with  improper specimen collection/handling, submission of specimen other than nasopharyngeal swab, presence of viral mutation(s) within the areas targeted by this assay, and inadequate number of viral copies(<138 copies/mL). A negative result must be combined with clinical observations, patient history, and epidemiological information. The expected result is Negative.  Fact Sheet for Patients:  BloggerCourse.com  Fact Sheet for Healthcare Providers:  SeriousBroker.it  This test is no t yet approved or cleared by the [place] FDA and  has been authorized for detection and/or diagnosis of SARS-CoV-2 by FDA under an Emergency Use Authorization (EUA). This EUA will remain  in effect (meaning this test can be used) for the duration of the COVID-19 declaration under Section 564(b)(1) of the Act, 21 U.S.C.section 360bbb-3(b)(1), unless the authorization is terminated  or revoked sooner.       Influenza A by PCR NEGATIVE NEGATIVE Final   Influenza B by PCR NEGATIVE NEGATIVE Final    Comment: (  NOTE) The Xpert Xpress SARS-CoV-2/FLU/RSV plus assay is intended as an aid in the diagnosis of influenza from Nasopharyngeal swab specimens and should not be used as a sole basis for treatment. Nasal washings and aspirates are unacceptable for Xpert Xpress SARS-CoV-2/FLU/RSV testing.  Fact Sheet for Patients: BloggerCourse.com  Fact Sheet for Healthcare Providers: SeriousBroker.it  This  test is not yet approved or cleared by the [place] FDA and has been authorized for detection and/or diagnosis of SARS-CoV-2 by FDA under an Emergency Use Authorization (EUA). This EUA will remain in effect (meaning this test can be used) for the duration of the COVID-19 declaration under Section 564(b)(1) of the Act, 21 U.S.C. section 360bbb-3(b)(1), unless the authorization is terminated or revoked.     Resp Syncytial Virus by PCR NEGATIVE NEGATIVE Final    Comment: (NOTE) Fact Sheet for Patients: BloggerCourse.com  Fact Sheet for Healthcare Providers: SeriousBroker.it  This test is not yet approved or cleared by the [place] FDA and has been authorized for detection and/or diagnosis of SARS-CoV-2 by FDA under an Emergency Use Authorization (EUA). This EUA will remain in effect (meaning this test can be used) for the duration of the COVID-19 declaration under Section 564(b)(1) of the Act, 21 U.S.C. section 360bbb-3(b)(1), unless the authorization is terminated or revoked.  Performed at [facility], [address], [place], [place] [address]   Respiratory (~20 pathogens) panel by PCR     Status: None   Collection Time: 12/04/22 12:06 PM   Specimen: Nasopharyngeal Swab; Respiratory  Result Value Ref Range Status   Adenovirus NOT DETECTED NOT DETECTED Final   Coronavirus 229E NOT DETECTED NOT DETECTED Final    Comment: (NOTE) The Coronavirus on the Respiratory Panel, DOES NOT test for the novel  Coronavirus (2019 nCoV)    Coronavirus HKU1 NOT DETECTED NOT DETECTED Final   Coronavirus NL63 NOT DETECTED NOT DETECTED Final   Coronavirus OC43 NOT DETECTED NOT DETECTED Final   Metapneumovirus NOT DETECTED NOT DETECTED Final   Rhinovirus / Enterovirus NOT DETECTED NOT DETECTED Final   Influenza A NOT DETECTED NOT DETECTED Final   Influenza B NOT DETECTED NOT DETECTED Final   Parainfluenza Virus 1 NOT DETECTED NOT  DETECTED Final   Parainfluenza Virus 2 NOT DETECTED NOT DETECTED Final   Parainfluenza Virus 3 NOT DETECTED NOT DETECTED Final   Parainfluenza Virus 4 NOT DETECTED NOT DETECTED Final   Respiratory Syncytial Virus NOT DETECTED NOT DETECTED Final   Bordetella pertussis NOT DETECTED NOT DETECTED Final   Bordetella Parapertussis NOT DETECTED NOT DETECTED Final   Chlamydophila pneumoniae NOT DETECTED NOT DETECTED Final   Mycoplasma pneumoniae NOT DETECTED NOT DETECTED Final    Comment: Performed at [facility], [address] [place], [place] [address]    Labs: CBC: Recent Labs  Lab 12/01/22 2116 12/01/22 2214 12/03/22 0907 12/03/22 1511 12/03/22 2052 12/03/22 2344 12/04/22 0619 12/05/22 0154  WBC 18.0*  --  9.8  --   --   --   --  12.5*  NEUTROABS 10.8*  --   --   --   --   --   --   --   HGB 4.2*   < > 8.6* 8.8* 8.7* 8.5* 8.7* 8.6*  HCT 11.7*   < > 25.2* 24.5* 24.3* 23.5* 25.6* 25.7*  MCV 107.3*  --  95.8  --   --   --   --  101.2*  PLT 144*  --  123*  --   --   --   --  124*   < > = values in this interval not displayed.   Basic Metabolic Panel: Recent Labs  Lab 12/01/22 2116 12/02/22 0703 12/04/22 0619  NA 130* 133* 135  K 4.0 3.7 4.0  CL 95* 100 101  CO2 GLUCOSE 110* 130* 142*  BUN CREATININE 1.26* 1.31* 1.03  CALCIUM 8.7* 8.5* 8.4*   Liver Function Tests: Recent Labs  Lab 12/01/22 2116 12/02/22 0703  AST 94* 88*  ALT 35 33  ALKPHOS 110 101  BILITOT 9.9* 9.9*  PROT 6.4* 6.2*  ALBUMIN 2.5* 2.5*   CBG: No results for input(s): "GLUCAP" in the last 168 hours.  Discharge time spent: {LESS THAN/GREATER WUJW:11914} 30 minutes.  Signed: Jacquelin Hawking, MD [facility] 12/06/2022

## 2022-12-06 NOTE — Progress Notes (Signed)
Discharge completed by Danella Penton RN. See nurse's note for detail.

## 2022-12-06 NOTE — Discharge Instructions (Addendum)
Nathaniel Warren,  You were in the hospital with a chest wall hematoma and resultant low blood counts. You received a blood transfusion and your blood counts have remained stable. While here, you developed pneumonia for which you are on antibiotics. You will need to continue antibiotics and follow-up with your PCP as you will need repeat chest imaging to ensure things have cleared up. Regarding your right leg, there is a nerve issue and you have been recommended to follow-up with neurology for testing; you may also need to see the neurosurgeon again depending on your test results.

## 2022-12-06 NOTE — Plan of Care (Signed)
Discharge instructions discussed with patient.  Patient instructed on home medications, restrictions, and follow up appointments. Belongings gathered and sent with patient.   Patient discharged via wheelchair by this writer.   

## 2022-12-11 ENCOUNTER — Telehealth: Payer: Self-pay

## 2022-12-11 NOTE — Telephone Encounter (Signed)
Transition Care Management Follow-up Telephone Call Date of discharge and from where: 12/06/22 How have you been since you were released from the hospital? Pt is still having issues Any questions or concerns? No  Items Reviewed: Did the pt receive and understand the discharge instructions provided? Yes  Medications obtained and verified? Yes  Other? No  Any new allergies since your discharge? No  Dietary orders reviewed? No Do you have support at home? Yes    Follow up appointments reviewed:  PCP Hospital f/u appt confirmed? Yes  Scheduled to see Farrel Gobble, NP on 12/18/22 @ 11:20 am. [facility] f/u appt confirmed? Yes   Are transportation arrangements needed? No  If their condition worsens, is the pt aware to call PCP or go to the Emergency Dept.? Yes Was the patient provided with contact information for the PCP's office or ED? Yes Was to pt encouraged to call back with questions or concerns? Yes    Elyse Jarvis RMA

## 2022-12-18 ENCOUNTER — Encounter: Payer: Self-pay | Admitting: Neurology

## 2022-12-18 ENCOUNTER — Other Ambulatory Visit: Payer: Self-pay

## 2022-12-18 ENCOUNTER — Ambulatory Visit (INDEPENDENT_AMBULATORY_CARE_PROVIDER_SITE_OTHER): Payer: Medicaid Other | Admitting: Nurse Practitioner

## 2022-12-18 ENCOUNTER — Encounter: Payer: Self-pay | Admitting: Nurse Practitioner

## 2022-12-18 VITALS — BP 103/54 | HR 96 | Temp 98.0°F | Ht 69.0 in | Wt 168.2 lb

## 2022-12-18 DIAGNOSIS — R202 Paresthesia of skin: Secondary | ICD-10-CM

## 2022-12-18 DIAGNOSIS — M25571 Pain in right ankle and joints of right foot: Secondary | ICD-10-CM

## 2022-12-18 DIAGNOSIS — D649 Anemia, unspecified: Secondary | ICD-10-CM

## 2022-12-18 DIAGNOSIS — F411 Generalized anxiety disorder: Secondary | ICD-10-CM | POA: Diagnosis not present

## 2022-12-18 DIAGNOSIS — G8929 Other chronic pain: Secondary | ICD-10-CM

## 2022-12-18 DIAGNOSIS — G479 Sleep disorder, unspecified: Secondary | ICD-10-CM | POA: Diagnosis not present

## 2022-12-18 DIAGNOSIS — T148XXA Other injury of unspecified body region, initial encounter: Secondary | ICD-10-CM

## 2022-12-18 MED ORDER — FUROSEMIDE 40 MG PO TABS: 40.0000 mg | ORAL_TABLET | Freq: Every day | ORAL | 3 refills | Status: DC

## 2022-12-18 MED ORDER — SPIRONOLACTONE 50 MG PO TABS: 50.0000 mg | ORAL_TABLET | Freq: Every morning | ORAL | 3 refills | Status: DC

## 2022-12-18 MED ORDER — HYDROMORPHONE HCL 4 MG PO TABS: 4.0000 mg | ORAL_TABLET | Freq: Four times a day (QID) | ORAL | 0 refills | Status: AC | PRN

## 2022-12-18 MED ORDER — GABAPENTIN 300 MG PO CAPS: 300.0000 mg | ORAL_CAPSULE | Freq: Three times a day (TID) | ORAL | 1 refills | Status: DC

## 2022-12-18 MED ORDER — TRAZODONE HCL 100 MG PO TABS: 100.0000 mg | ORAL_TABLET | Freq: Every evening | ORAL | 2 refills | Status: DC

## 2022-12-18 NOTE — Progress Notes (Signed)
[facility] [address] [place] [address]                                   Transitional Care Clinic   [facility] Discharge Acute Issues Care Follow Up                                                                        Patient Demographics  Nathaniel Warren, is a 57 y.o. male  DOB 22-Dec-1965  MRN [id number].  Primary MD  Ivonne Andrew, NP   Reason for TCC follow Up - hospital follow up   Past Medical History:  Diagnosis Date   Depression    Hernia, abdominal    Liver failure (HCC)    Seizures (HCC)    Stroke [facility])     Past Surgical History:  Procedure Laterality Date   ANKLE SURGERY     ANKLE SURGERY Right    HERNIA REPAIR     SPLENECTOMY         Recent HPI and Hospital Course  Admit date:     12/01/2022  Discharge date: 12/06/2022   Recommendations at discharge:  Hospital follow-up with PCP Continue antibiotics Repeat chest CT in 3-4 weeks to ensure resolution of ground-glass opacities Neurology follow-up for right lower leg NCV EMG testing   Discharge Diagnoses: Principal Problem:   Chest wall hematoma Active Problems:   ABLA (acute blood loss anemia)   Alcoholic cirrhosis of liver with ascites (HCC)   Alcohol use disorder   Chronic pain of right ankle   Thrombocytopenia (HCC)   Nerve compression   Hypoxia   Ground glass opacity present on imaging of lung   Foot drop, right  Hospital Course: Nathaniel Warren is a 57 y.o. male with a history of alcohol abuse, cirrhosis, depression. Patient presented secondary to right arm pain/bruising and was found to have a chest wall hematoma with associated profound anemia requiring blood transfusion. While admitted, patient was found to have right lower extremity weakness with MRI findings consistent with herniated disc with S1 nerve compression. He then developed hypoxia requiring supplemental oxygen.   Assessment and Plan: * Chest wall  hematoma Right sided. Secondary to trauma from crutch use. Associated blood loss anemia with significant anemia. Trauma surgery consulted in the ED, without recommendations. Appears stable.   ABLA (acute blood loss anemia) Secondary to acute bleed and chest wall hematoma. Hemoglobin of 4.2 on admission. Associated INR of 2.1. Platelets 144 > 123, although patient has chronic thrombocytopenia. Patient transfused 2 units of PRBC with post-transfusion hemoglobin of 9.0. Hemoglobin has remained stable.   Alcoholic cirrhosis of liver with ascites (HCC) Patient is on Lasix and aldactone as an outpatient, which were held on admission secondary to hypotension from acute blood loss anemia. Blood pressure has remained slightly low-normal.   Chronic pain of right ankle Secondary to fall. No fractures.   Alcohol use disorder CIWA protocol started. No issues with withdrawal.   Ground glass opacity present on imaging of lung Present on CT imaging, involving medial lingula, infrahilar right lower lobe, and infrahilar right middle  lobe. Initial recommendations for repeat CT imaging in 3-4 weeks to ensure resolution of ground-glass opacities. Now, patient appears to be showing signs of acute illness which may explain findings. Treating pneumonia. Patient will still need repeat imaging to ensure resolution.   Hypoxia Initially, unclear etiology but concern for viral illness. Associated cough. No phlegm production, although patient feels congested. 4 L/min of supplemental oxygen applied for SpO2 of 85% on room air. Influenza, COVID-19 and RVP negative. Leukocytosis today. Chest x-ray suggests development of infiltrate. -Start Ceftriaxone/Azithromycin for empiric treatment of pneumonia.   Nerve compression MRI (12/26) significant for L5-S1 right-sided disc herniation with associated stenosis of the subarticular lateral recess and S1 nerve compression/displacement. Associated RLE radicular symptoms. Patient seen  by neurosurgery who recommends NCV EMG of right lower extremity as an outpatient.   Thrombocytopenia ([place]) Likely related to liver disease. Prior baseline platelet count of about 70-80,000. Platelets of 144,000 on admission, down to 123,000. Stable.  Review of Systems  Constitutional: Negative.   HENT: Negative.    Eyes: Negative.   Respiratory: Negative.    Cardiovascular: Negative.   Gastrointestinal: Negative.   Genitourinary: Negative.   Musculoskeletal: Negative.   Skin: Negative.   Neurological: Negative.   Endo/Heme/Allergies: Negative.   Psychiatric/Behavioral: Negative.        Subjective:   Nathaniel Warren 56 year old male with history of stroke, liver failure, alcoholic cirrhosis (followed by GI).   Patient presents today for a hospital follow-up.  Please see notes above.  Patient states that he is much improved since his discharge.  Patient does have appointment scheduled with neurosurgery later this month as advised at hospital discharge.  Patient will need follow-up blood work.  He does need refills on some of his medications.  Pain to his low back and lower extremities is requesting pain medicine until he can get to the neurosurgery appointment.  We discussed that we can order Dilaudid low-dose for him to use sparingly until his appointment.  There will be no refills on this medication.  Today has, No headache, No chest pain, No abdominal pain - No Nausea, No new weakness tingling or numbness, No Cough - SOB.    Assessment & Plan   1. Anemia, unspecified type  - CBC - Comprehensive metabolic panel  2. Sleep disturbances  - traZODone (DESYREL) 100 MG tablet; Take 1 tablet (100 mg total) by mouth at bedtime.  Dispense: 30 tablet; Refill: 2  3. GAD (generalized anxiety disorder)  - gabapentin (NEURONTIN) 300 MG capsule; Take 1 capsule (300 mg total) by mouth 3 (three) times daily. For withdrawals  Dispense: 90 capsule; Refill: 1  4. Chronic pain of right  ankle  - HYDROmorphone (DILAUDID) 4 MG tablet; Take 1 tablet (4 mg total) by mouth every 6 (six) hours as needed for up to 7 days for severe pain.  Dispense: 28 tablet; Refill: 0  5. Nerve compression  - HYDROmorphone (DILAUDID) 4 MG tablet; Take 1 tablet (4 mg total) by mouth every 6 (six) hours as needed for up to 7 days for severe pain.  Dispense: 28 tablet; Refill: 0    Follow up:  Follow up in 1 month     Objective:   Vitals:   12/18/22 1139  BP: (!) 103/54  Pulse: 96  Temp: 98 F (36.7 C)  SpO2: 99%  Weight: 168 lb 3.2 oz (76.3 kg)  Height: 5\' 9"  (1.753 m)    Wt Readings from Last 3 Encounters:  12/18/22 168 lb 3.2  oz (76.3 kg)  12/02/22 175 lb (79.4 kg)  09/22/22 181 lb (82.1 kg)    Allergies as of 12/18/2022   No Known Allergies      Medication List        Accurate as of December 18, 2022 12:14 PM. If you have any questions, ask your nurse or doctor.          furosemide 40 MG tablet Commonly known as: LASIX Take 1 tablet (40 mg total) by mouth daily.   gabapentin 300 MG capsule Commonly known as: NEURONTIN Take 1 capsule (300 mg total) by mouth 3 (three) times daily. For withdrawals   HYDROmorphone 4 MG tablet Commonly known as: Dilaudid Take 1 tablet (4 mg total) by mouth every 6 (six) hours as needed for up to 7 days for severe pain. Started by: [name], NP   ondansetron 4 MG disintegrating tablet Commonly known as: ZOFRAN-ODT Take 1 tablet (4 mg total) by mouth every 8 (eight) hours as needed for nausea or vomiting.   PARoxetine 20 MG tablet Commonly known as: PAXIL Take 1 tablet (20 mg total) by mouth daily.   potassium chloride SA 20 MEQ tablet Commonly known as: KLOR-CON M Take 1 tablet (20 mEq total) by mouth 2 (two) times daily for 2 days.   QUEtiapine 100 MG tablet Commonly known as: SEROQUEL Take 1 tablet (100 mg total) by mouth at bedtime.   spironolactone 50 MG tablet Commonly known as: Aldactone Take 1 tablet  (50 mg total) by mouth in the morning.   traZODone 100 MG tablet Commonly known as: DESYREL Take 1 tablet (100 mg total) by mouth at bedtime.         Physical Exam: Constitutional: Patient appears well-developed and well-nourished. Not in obvious distress. HENT: Normocephalic, atraumatic, External right and left ear normal. Oropharynx is clear and moist.  Eyes: Conjunctivae and EOM are normal. PERRLA, no scleral icterus. Neck: Normal ROM. Neck supple. No JVD. No tracheal deviation. No thyromegaly. CVS: RRR, S1/S2 +, no murmurs, no gallops, no carotid bruit.  Pulmonary: Effort and breath sounds normal, no stridor, rhonchi, wheezes, rales.  Abdominal: Soft. BS +, no distension, tenderness, rebound or guarding.  Musculoskeletal: Normal range of motion. No edema and no tenderness.  Lymphadenopathy: No lymphadenopathy noted, cervical, inguinal or axillary Neuro: Alert. Normal reflexes, muscle tone coordination. No cranial nerve deficit. Skin: Skin is warm and dry. No rash noted. Not diaphoretic. No erythema. No pallor. Psychiatric: Normal mood and affect. Behavior, judgment, thought content normal.   Data Review   Micro Results No results found for this or any previous visit (from the past 240 hour(s)).   CBC No results for input(s): "WBC", "HGB", "HCT", "PLT", "MCV", "MCH", "MCHC", "RDW", "LYMPHSABS", "MONOABS", "EOSABS", "BASOSABS", "BANDABS" in the last 168 hours.  Invalid input(s): "NEUTRABS", "BANDSABD"  Chemistries  No results for input(s): "NA", "K", "CL", "CO2", "GLUCOSE", "BUN", "CREATININE", "CALCIUM", "MG", "AST", "ALT", "ALKPHOS", "BILITOT" in the last 168 hours.  Invalid input(s): "GFRCGP" ------------------------------------------------------------------------------------------------------------------ estimated creatinine clearance is 82 mL/min (by C-G formula based on SCr of 1.03  mg/dL). ------------------------------------------------------------------------------------------------------------------ No results for input(s): "HGBA1C" in the last 72 hours. ------------------------------------------------------------------------------------------------------------------ No results for input(s): "CHOL", "HDL", "LDLCALC", "TRIG", "CHOLHDL", "LDLDIRECT" in the last 72 hours. ------------------------------------------------------------------------------------------------------------------ No results for input(s): "TSH", "T4TOTAL", "T3FREE", "THYROIDAB" in the last 72 hours.  Invalid input(s): "FREET3" ------------------------------------------------------------------------------------------------------------------ No results for input(s): "VITAMINB12", "FOLATE", "FERRITIN", "TIBC", "IRON", "RETICCTPCT" in the last 72 hours.  Coagulation profile No results for input(s): "INR", "PROTIME"  in the last 168 hours.  No results for input(s): "DDIMER" in the last 72 hours.  Cardiac Enzymes No results for input(s): "CKMB", "TROPONINI", "MYOGLOBIN" in the last 168 hours.  Invalid input(s): "CK" ------------------------------------------------------------------------------------------------------------------ Invalid input(s): "POCBNP"   Time Spent in minutes  23   [name] M.D on 12/18/2022 at 12:14 PM   Disclaimer: This note may have been dictated with voice recognition software. Similar sounding words can inadvertently be transcribed and this note may contain transcription errors which may not have been corrected upon publication of note.

## 2022-12-18 NOTE — Patient Instructions (Addendum)
1. Anemia, unspecified type  - CBC - Comprehensive metabolic panel  2. Sleep disturbances  - traZODone (DESYREL) 100 MG tablet; Take 1 tablet (100 mg total) by mouth at bedtime.  Dispense: 30 tablet; Refill: 2  3. GAD (generalized anxiety disorder)  - gabapentin (NEURONTIN) 300 MG capsule; Take 1 capsule (300 mg total) by mouth 3 (three) times daily. For withdrawals  Dispense: 90 capsule; Refill: 1  4. Chronic pain of right ankle  - HYDROmorphone (DILAUDID) 4 MG tablet; Take 1 tablet (4 mg total) by mouth every 6 (six) hours as needed for up to 7 days for severe pain.  Dispense: 28 tablet; Refill: 0  5. Nerve compression  - HYDROmorphone (DILAUDID) 4 MG tablet; Take 1 tablet (4 mg total) by mouth every 6 (six) hours as needed for up to 7 days for severe pain.  Dispense: 28 tablet; Refill: 0    Follow up:  Follow up in 1 month

## 2022-12-19 LAB — COMPREHENSIVE METABOLIC PANEL
ALT: 27 IU/L (ref 0–44)
AST: 71 IU/L — ABNORMAL HIGH (ref 0–40)
Albumin/Globulin Ratio: 0.7 — ABNORMAL LOW (ref 1.2–2.2)
Albumin: 3 g/dL — ABNORMAL LOW (ref 3.8–4.9)
Alkaline Phosphatase: 187 IU/L — ABNORMAL HIGH (ref 44–121)
BUN/Creatinine Ratio: 10 (ref 9–20)
BUN: 9 mg/dL (ref 6–24)
Bilirubin Total: 9.1 mg/dL — ABNORMAL HIGH (ref 0.0–1.2)
CO2: 22 mmol/L (ref 20–29)
Calcium: 9 mg/dL (ref 8.7–10.2)
Chloride: 99 mmol/L (ref 96–106)
Creatinine, Ser: 0.89 mg/dL (ref 0.76–1.27)
Globulin, Total: 4.4 g/dL (ref 1.5–4.5)
Glucose: 143 mg/dL — ABNORMAL HIGH (ref 70–99)
Potassium: 3.8 mmol/L (ref 3.5–5.2)
Sodium: 135 mmol/L (ref 134–144)
Total Protein: 7.4 g/dL (ref 6.0–8.5)
eGFR: 102 mL/min/{1.73_m2} (ref >59)

## 2022-12-19 LAB — CBC
Hematocrit: 31.9 % — ABNORMAL LOW (ref 37.5–51.0)
Hemoglobin: 12 g/dL — ABNORMAL LOW (ref 13.0–17.7)
MCH: 35.9 pg — ABNORMAL HIGH (ref 26.6–33.0)
MCHC: 37.6 g/dL — ABNORMAL HIGH (ref 31.5–35.7)
MCV: 96 fL (ref 79–97)
Platelets: 115 10*3/uL — ABNORMAL LOW (ref 150–450)
RBC: 3.34 x10E6/uL — ABNORMAL LOW (ref 4.14–5.80)
RDW: 21.7 % — ABNORMAL HIGH (ref 11.6–15.4)
WBC: 8.4 10*3/uL (ref 3.4–10.8)

## 2022-12-22 ENCOUNTER — Telehealth: Payer: Self-pay | Admitting: Physical Therapy

## 2022-12-22 ENCOUNTER — Ambulatory Visit: Payer: Self-pay | Attending: Physical Therapy | Admitting: Physical Therapy

## 2022-12-22 NOTE — Telephone Encounter (Signed)
Called patient and LVM about no show to initial physical therapy eval at 10:15am on 12/22/2022. Gave callback number for rescheduling.   Malachi Carl, PT, DPT

## 2022-12-26 ENCOUNTER — Ambulatory Visit (INDEPENDENT_AMBULATORY_CARE_PROVIDER_SITE_OTHER): Payer: Medicaid Other | Admitting: Neurology

## 2022-12-26 ENCOUNTER — Encounter: Payer: Self-pay | Admitting: Neurology

## 2022-12-26 VITALS — BP 112/73 | HR 89 | Resp 20 | Ht 69.0 in | Wt 169.0 lb

## 2022-12-26 DIAGNOSIS — M21371 Foot drop, right foot: Secondary | ICD-10-CM | POA: Diagnosis not present

## 2022-12-26 NOTE — Patient Instructions (Signed)
Nerve testing of the legs  Start physical therapy for right foot drop  Referral provided for right ankle foot orthotic  Return to clinic in 3 months

## 2022-12-26 NOTE — Progress Notes (Signed)
[facility] Neurology Division Clinic Note - Initial Visit   Date: 12/26/2022   Nathaniel Warren MRN: [id number] DOB: 13-Sep-1966   Dear Dr. Caleb Popp:  Thank you for your kind referral of Nathaniel Warren for consultation of right foot drop. Although his history is well known to you, please allow [place] to reiterate it for the purpose of our medical record. The patient was accompanied to the clinic by self.     Nathaniel Warren is a 57 y.o. right-handed male with history of alcohol abuse, cirrhois, depression presenting for evaluation of right foot drop.   IMPRESSION/PLAN: Right foot drop most likely secondary to right peroneal nerve injury s/p fall.  He may also have some overlapping L5-S1 radiculopathy on the right, but contributing to a lesser degree.  MRI lumbar spine personally viewed and shows right S1 nerve compression and foraminal stenosis which could affect L5 nerve also.  He also has stenosis affecting the L3-4 levels which can cause proximal leg weakness.  He was evaluated by Dr. Lovell Sheehan and deemed not a surgical candidate  - NCS/EMG or the right > left  - Start PT for leg strengthening  - Referral provided for ankle foot orthotic  Return to clinic in 3 months  ------------------------------------------------------------- History of present illness: In September 2023, he had a fall and landed inside the bathtub on the right side of the leg and hip.  Since this time, he has numbness over the lateral side of the lower leg and foot.  He also has foot drop and unable to move the toes.  He denies numbness/tingling or weakness in the left foot.  He was using crutches to walk initially and now a rigid walker.  He does not have a brace for the foot and tends to drag the foot   He lives at home with cousin and roommate in a one-level home.  He quit drinking alcohol in October of 2023 when he was diagnosed with cirrhosis.  He has history of alcohol abuse consume up to 1.5 pint - fifth  of alcohol every 2 days x 20 years and reduced to a several drinks nightly for the past year.    Out-side paper records, electronic medical record, and images have been reviewed where available and summarized as:  MRI lumbar spine wo contrast 12/02/2022: 1. L5-S1: Right-sided predominant disc herniation with caudal down turning. Stenosis of the subarticular lateral recess on the right with apparent compression/displacement of the right S1 nerve. Mild bilateral foraminal narrowing that could possibly affect either L5 nerve. 2. L4-5: Bilateral lateral recess stenosis that could affect either L5 nerve. Left foraminal encroachment by osteophyte and disc material could affect the left L4 nerve. 3. L2-3: Bilateral lateral recess stenosis that could affect either L3 nerve. 4. L3-4: Mild bilateral lateral recess stenosis but without likely neural compression.  Lab Results  Component Value Date   HGBA1C 5.3 10/28/2017   Lab Results  Component Value Date   VITAMINB12 3,366 (H) 12/02/2022   Lab Results  Component Value Date   TSH 3.734 10/28/2017   No results found for: "ESRSEDRATE", "POCTSEDRATE"  Past Medical History:  Diagnosis Date   Depression    Hernia, abdominal    Liver failure (HCC)    Seizures (HCC)    Stroke [facility])     Past Surgical History:  Procedure Laterality Date   ANKLE SURGERY     ANKLE SURGERY Right    HERNIA REPAIR     SPLENECTOMY  Medications:  Outpatient Encounter Medications as of 12/26/2022  Medication Sig Note   furosemide (LASIX) 40 MG tablet Take 1 tablet (40 mg total) by mouth daily.    gabapentin (NEURONTIN) 300 MG capsule Take 1 capsule (300 mg total) by mouth 3 (three) times daily. For withdrawals    ondansetron (ZOFRAN-ODT) 4 MG disintegrating tablet Take 1 tablet (4 mg total) by mouth every 8 (eight) hours as needed for nausea or vomiting. 12/18/2022: Prn    PARoxetine (PAXIL) 20 MG tablet Take 1 tablet (20 mg total) by mouth daily.  12/02/2022: Wasn't taking as directed initially, but then when he felt like it was working, began to take it as directed.    QUEtiapine (SEROQUEL) 100 MG tablet Take 1 tablet (100 mg total) by mouth at bedtime. 12/02/2022: Wasn't taking as directed initially, but then when he felt like it was working, began to take it as directed.    spironolactone (ALDACTONE) 50 MG tablet Take 1 tablet (50 mg total) by mouth in the morning.    traZODone (DESYREL) 100 MG tablet Take 1 tablet (100 mg total) by mouth at bedtime.    potassium chloride SA (KLOR-CON M) 20 MEQ tablet Take 1 tablet (20 mEq total) by mouth 2 (two) times daily for 2 days. (Patient not taking: Reported on 08/05/2022) 12/02/2022: Never knew about the prescription   No facility-administered encounter medications on file as of 12/26/2022.    Allergies: No Known Allergies  Family History: Family History  Problem Relation Age of Onset   COPD Mother    Diabetes Father    Stroke Maternal Grandmother    Alzheimer's disease Maternal Grandfather    Cancer Paternal Grandmother    Stomach cancer Paternal Grandfather    Hypertension Other    Colon cancer Neg Hx    Esophageal cancer Neg Hx    Colon polyps Neg Hx    Inflammatory bowel disease Neg Hx    Liver disease Neg Hx    Pancreatic cancer Neg Hx    Rectal cancer Neg Hx     Social History: Social History   Tobacco Use   Smoking status: Every Day    Packs/day: 1.00    Years: 30.00    Total pack years: 30.00    Types: Cigarettes   Smokeless tobacco: Former  Scientific laboratory technician Use: Never used  Substance Use Topics   Alcohol use: Not Currently   Drug use: Yes    Types: Marijuana   Social History   Social History Narrative   ** Merged History Encounter **    Right handed   Unable to work   Lives with roommate and cousin   Drinks caffeine   One floor house    Vital Signs:  BP 112/73   Pulse 89   Resp 20   Ht 5\' 9"  (1.753 m)   Wt 169 lb (76.7 kg)   SpO2 96%    BMI 24.96 kg/m    Neurological Exam: MENTAL STATUS including orientation to time, place, person, recent and remote memory, attention span and concentration, language, and fund of knowledge is normal.  Speech is not dysarthric.  CRANIAL NERVES: II:  No visual field defects.   III-IV-VI: Pupils equal round and reactive to light.  Normal conjugate, extra-ocular eye movements in all directions of gaze.  No nystagmus.  No ptosis.   V:  Normal facial sensation.    VII:  Normal facial symmetry and movements.   VIII:  Normal hearing and  vestibular function.   IX-X:  Normal palatal movement.   XI:  Normal shoulder shrug and head rotation.   XII:  Normal tongue strength and range of motion, no deviation or fasciculation.  MOTOR:  Mild right TA atrophy, fasciculations or abnormal movements.  No pronator drift.   Upper Extremity:  Right  Left  Deltoid  5/5   5/5   Biceps  5/5   5/5   Triceps  5/5   5/5   Wrist extensors  5/5   5/5   Wrist flexors  5/5   5/5   Finger extensors  5/5   5/5   Finger flexors  5/5   5/5   Dorsal interossei  5/5   5/5   Abductor pollicis  5/5   5/5   Tone (Ashworth scale)  0  0   Lower Extremity:  Right  Left  Hip flexors  5/5   5/5   Hip extensors  5/5   5/5   Adductor 5/5  5/5  Abductor 5/5  5/5  Knee flexors  5/5   5/5   Knee extensors  5/5   5/5   Dorsiflexors  0/5   5/5   Plantarflexors  3/5   5/5   Inversion 3/5  5/5  Eversion 0/5  5/5  Toe extensors  0/5   5/5   Toe flexors  1/5   5/5   Tone (Ashworth scale)  0  0   MSRs:                                           Right        Left brachioradialis 2+  2+  biceps 2+  2+  triceps 2+  2+  patellar 3+  3+  ankle jerk 0  2+  Hoffman no  no  plantar response down  down  Crossed adductors bilaterally.   SENSORY:  Vibration absent at the right ankle and great toe.  Pin prick and temperature reduced over the right lateral leg and foot.  Sensation intact above the right knee and at the left foot.    COORDINATION/GAIT: Normal finger-to- nose-finger.  Finger tapping intact.  Gait testing shows dragging of the right foot, mild steppage, assisted with walker.  Slow  Thank you for allowing me to participate in patient's care.  If I can answer any additional questions, I would be pleased to do so.    Sincerely,    Korri Ask K. Posey Pronto, DO

## 2023-01-01 ENCOUNTER — Encounter: Payer: Self-pay | Admitting: Neurology

## 2023-01-02 ENCOUNTER — Ambulatory Visit: Payer: Self-pay | Admitting: Nurse Practitioner

## 2023-01-06 ENCOUNTER — Ambulatory Visit (INDEPENDENT_AMBULATORY_CARE_PROVIDER_SITE_OTHER): Payer: Medicaid Other | Admitting: Nurse Practitioner

## 2023-01-06 ENCOUNTER — Encounter: Payer: Self-pay | Admitting: Nurse Practitioner

## 2023-01-06 VITALS — BP 119/63 | HR 75 | Temp 98.1°F | Resp 19 | Wt 168.6 lb

## 2023-01-06 DIAGNOSIS — K0889 Other specified disorders of teeth and supporting structures: Secondary | ICD-10-CM | POA: Diagnosis not present

## 2023-01-06 DIAGNOSIS — Z716 Tobacco abuse counseling: Secondary | ICD-10-CM | POA: Diagnosis not present

## 2023-01-06 DIAGNOSIS — F332 Major depressive disorder, recurrent severe without psychotic features: Secondary | ICD-10-CM | POA: Diagnosis not present

## 2023-01-06 MED ORDER — AMOXICILLIN 875 MG PO TABS: 875.0000 mg | ORAL_TABLET | Freq: Two times a day (BID) | ORAL | 0 refills | Status: AC

## 2023-01-06 NOTE — Patient Instructions (Addendum)
1. Tooth pain  - amoxicillin (AMOXIL) 875 MG tablet; Take 1 tablet (875 mg total) by mouth 2 (two) times daily for 10 days.  Dispense: 20 tablet, follow up with your dentist as needed.    Thanks for choosing [facility] we consider it a privelige to serve you.

## 2023-01-06 NOTE — Assessment & Plan Note (Signed)
Poor dental hygiene noted, gum swelling and tender to touch - amoxicillin (AMOXIL) 875 MG tablet; Take 1 tablet (875 mg total) by mouth 2 (two) times daily for 10 days.  Dispense: 20 tablet; Refill: 0

## 2023-01-06 NOTE — Assessment & Plan Note (Signed)
Smokes about  less than 0.5 pack/day  Asked about quitting: confirms that he/she currently smokes cigarettes Advise to quit smoking: Educated about QUITTING to reduce the risk of cancer, cardio and cerebrovascular disease. Assess willingness: Unwilling to quit at this time, but is working on cutting back. Assist with counseling and pharmacotherapy: Counseled for 5 minutes and literature provided. Arrange for follow up: follow up in 3 months and continue to offer help.  

## 2023-01-06 NOTE — Progress Notes (Signed)
Acute Office Visit  Subjective:     Patient ID: Nathaniel Warren, male    DOB: [birth year], 57 y.o.   MRN: [id number]  No chief complaint on file.   HPI Mr. Zirbel with past medical history of major depressive disorder, alcoholic cirrhosis of liver with ascites, portal hypertension, hyperbilirubinemia, PTSD, tobacco abuse presents with complaints of tooth pain that started 3 days ago,stated that 4 days ago he broke a tooth while eating a piece of candy .has constant throbbing pain rated 8/10 , he had called the dentist office but they cannot see him until in 3 weeks .he denies fever, chills, malaise, fatigue, HA  Has past medical history of alcohol abuse stated that he has not had a drink of alcohol since August 2023 , he smokes less than half pack of cigarettes daily .he denies chest pain, cough, wheezing, shortness of breath.     Review of Systems  Constitutional:  Negative for chills, diaphoresis, fever and weight loss.  HENT:  Negative for ear discharge, ear pain, hearing loss, nosebleeds and tinnitus.   Respiratory:  Negative for cough, hemoptysis, sputum production, shortness of breath and wheezing.   Cardiovascular:  Negative for chest pain, palpitations, orthopnea, claudication and leg swelling.  Gastrointestinal:  Negative for abdominal pain, blood in stool, constipation, diarrhea, nausea and vomiting.  Neurological:  Negative for dizziness, tingling, tremors, sensory change and headaches.  Psychiatric/Behavioral:  Positive for depression. Negative for hallucinations, memory loss and suicidal ideas.         Objective:    BP 119/63   Pulse 75   Temp 98.1 F (36.7 C)   Resp 19   Wt 168 lb 9.6 oz (76.5 kg)   SpO2 99%   BMI 24.90 kg/m    Physical Exam Constitutional:      General: He is not in acute distress.    Appearance: Normal appearance. He is not ill-appearing, toxic-appearing or diaphoretic.  HENT:     Mouth/Throat:     Mouth: Mucous membranes are moist.      Dentition: Abnormal dentition. Dental tenderness, gingival swelling and dental caries present. No dental abscesses.     Tongue: No lesions. Tongue does not deviate from midline.     Pharynx: No pharyngeal swelling, oropharyngeal exudate, posterior oropharyngeal erythema or uvula swelling.     Tonsils: No tonsillar exudate or tonsillar abscesses.      Comments: Tenderness on palpation of right side of the jaw Eyes:     General: Scleral icterus present.        Left eye: No discharge.  Cardiovascular:     Rate and Rhythm: Normal rate and regular rhythm.     Pulses: Normal pulses.     Heart sounds: Normal heart sounds. No murmur heard.    No friction rub. No gallop.  Pulmonary:     Effort: Pulmonary effort is normal. No respiratory distress.     Breath sounds: Normal breath sounds. No stridor. No wheezing, rhonchi or rales.  Chest:     Chest wall: No tenderness.  Abdominal:     General: There is no distension.     Tenderness: There is no abdominal tenderness. There is no guarding.  Musculoskeletal:     Right lower leg: Edema present.     Left lower leg: Edema present.  Skin:    Coloration: Skin is jaundiced.  Neurological:     Mental Status: He is alert and oriented to person, place, and time.  Psychiatric:  Mood and Affect: Mood normal.        Behavior: Behavior normal.        Thought Content: Thought content normal.        Judgment: Judgment normal.     No results found for any visits on 01/06/23.      Assessment & Plan:   Problem List Items Addressed This Visit       Other   MDD (major depressive disorder), recurrent severe, without psychosis ([place])    [facility] Office Visit from 12/18/2022 in [place]  PHQ-9 Total Score 13      Followed by psychiatrist Takes paxil 20mg  daily  He has not been taking trazodone and Seroquel because they are not working He denies SI, HI  He was encouraged to maintain close up with psych, he  verbalized understanding.       Tobacco abuse counseling    Smokes about less than 0.5 pack/day  Asked about quitting: confirms that he/she currently smokes cigarettes Advise to quit smoking: Educated about QUITTING to reduce the risk of cancer, cardio and cerebrovascular disease. Assess willingness: Unwilling to quit at this time, but is working on cutting back. Assist with counseling and pharmacotherapy: Counseled for 5 minutes and literature provided. Arrange for follow up: follow up in 3 months and continue to offer help.       Tooth pain - Primary    Poor dental hygiene noted, gum swelling and tender to touch - amoxicillin (AMOXIL) 875 MG tablet; Take 1 tablet (875 mg total) by mouth 2 (two) times daily for 10 days.  Dispense: 20 tablet; Refill: 0        Relevant Medications   amoxicillin (AMOXIL) 875 MG tablet    Meds ordered this encounter  Medications   amoxicillin (AMOXIL) 875 MG tablet    Sig: Take 1 tablet (875 mg total) by mouth 2 (two) times daily for 10 days.    Dispense:  20 tablet    Refill:  0    No follow-ups on file.  Renee Rival, FNP

## 2023-01-06 NOTE — Assessment & Plan Note (Addendum)
[place] [facility] Office Visit from 12/18/2022 in [place]  PHQ-9 Total Score 13      Followed by psychiatrist Takes paxil 20mg  daily  He has not been taking trazodone and Seroquel because they are not working He denies SI, HI  He was encouraged to maintain close up with psych, he verbalized understanding.

## 2023-01-15 ENCOUNTER — Ambulatory Visit: Payer: Self-pay | Admitting: Nurse Practitioner

## 2023-01-22 ENCOUNTER — Inpatient Hospital Stay (HOSPITAL_COMMUNITY)
Admission: EM | Admit: 2023-01-22 | Discharge: 2023-03-02 | DRG: 513 | Disposition: A | Discharge status: Home / Self Care | Payer: Medicaid Other | Attending: Internal Medicine | Admitting: Internal Medicine

## 2023-01-22 ENCOUNTER — Emergency Department (HOSPITAL_COMMUNITY): Payer: Medicaid Other

## 2023-01-22 ENCOUNTER — Other Ambulatory Visit: Payer: Self-pay

## 2023-01-22 DIAGNOSIS — I96 Gangrene, not elsewhere classified: Secondary | ICD-10-CM | POA: Diagnosis present

## 2023-01-22 DIAGNOSIS — Z91120 Patient's intentional underdosing of medication regimen due to financial hardship: Secondary | ICD-10-CM

## 2023-01-22 DIAGNOSIS — D62 Acute posthemorrhagic anemia: Secondary | ICD-10-CM | POA: Diagnosis not present

## 2023-01-22 DIAGNOSIS — T50916A Underdosing of multiple unspecified drugs, medicaments and biological substances, initial encounter: Secondary | ICD-10-CM | POA: Diagnosis present

## 2023-01-22 DIAGNOSIS — R233 Spontaneous ecchymoses: Secondary | ICD-10-CM | POA: Diagnosis present

## 2023-01-22 DIAGNOSIS — K7030 Alcoholic cirrhosis of liver without ascites: Secondary | ICD-10-CM | POA: Diagnosis present

## 2023-01-22 DIAGNOSIS — M5430 Sciatica, unspecified side: Secondary | ICD-10-CM | POA: Diagnosis present

## 2023-01-22 DIAGNOSIS — G894 Chronic pain syndrome: Secondary | ICD-10-CM | POA: Diagnosis present

## 2023-01-22 DIAGNOSIS — Z560 Unemployment, unspecified: Secondary | ICD-10-CM

## 2023-01-22 DIAGNOSIS — S61335A Puncture wound without foreign body of left ring finger with damage to nail, initial encounter: Secondary | ICD-10-CM | POA: Diagnosis present

## 2023-01-22 DIAGNOSIS — M21371 Foot drop, right foot: Secondary | ICD-10-CM | POA: Diagnosis present

## 2023-01-22 DIAGNOSIS — M25571 Pain in right ankle and joints of right foot: Secondary | ICD-10-CM | POA: Diagnosis present

## 2023-01-22 DIAGNOSIS — Z9081 Acquired absence of spleen: Secondary | ICD-10-CM

## 2023-01-22 DIAGNOSIS — N179 Acute kidney failure, unspecified: Secondary | ICD-10-CM | POA: Diagnosis not present

## 2023-01-22 DIAGNOSIS — Z5982 Transportation insecurity: Secondary | ICD-10-CM

## 2023-01-22 DIAGNOSIS — D6959 Other secondary thrombocytopenia: Secondary | ICD-10-CM | POA: Diagnosis present

## 2023-01-22 DIAGNOSIS — E871 Hypo-osmolality and hyponatremia: Secondary | ICD-10-CM | POA: Diagnosis present

## 2023-01-22 DIAGNOSIS — K7031 Alcoholic cirrhosis of liver with ascites: Secondary | ICD-10-CM | POA: Diagnosis present

## 2023-01-22 DIAGNOSIS — Z800 Family history of malignant neoplasm of digestive organs: Secondary | ICD-10-CM

## 2023-01-22 DIAGNOSIS — Z79899 Other long term (current) drug therapy: Secondary | ICD-10-CM

## 2023-01-22 DIAGNOSIS — Z720 Tobacco use: Secondary | ICD-10-CM | POA: Diagnosis present

## 2023-01-22 DIAGNOSIS — L8992 Pressure ulcer of unspecified site, stage 2: Secondary | ICD-10-CM | POA: Diagnosis not present

## 2023-01-22 DIAGNOSIS — Z9151 Personal history of suicidal behavior: Secondary | ICD-10-CM

## 2023-01-22 DIAGNOSIS — M7989 Other specified soft tissue disorders: Secondary | ICD-10-CM

## 2023-01-22 DIAGNOSIS — Z820 Family history of epilepsy and other diseases of the nervous system: Secondary | ICD-10-CM

## 2023-01-22 DIAGNOSIS — F332 Major depressive disorder, recurrent severe without psychotic features: Secondary | ICD-10-CM | POA: Diagnosis present

## 2023-01-22 DIAGNOSIS — Z8673 Personal history of transient ischemic attack (TIA), and cerebral infarction without residual deficits: Secondary | ICD-10-CM

## 2023-01-22 DIAGNOSIS — Z833 Family history of diabetes mellitus: Secondary | ICD-10-CM

## 2023-01-22 DIAGNOSIS — S40021A Contusion of right upper arm, initial encounter: Secondary | ICD-10-CM | POA: Diagnosis present

## 2023-01-22 DIAGNOSIS — N4341 Spermatocele of epididymis, single: Secondary | ICD-10-CM | POA: Diagnosis present

## 2023-01-22 DIAGNOSIS — I82622 Acute embolism and thrombosis of deep veins of left upper extremity: Secondary | ICD-10-CM | POA: Diagnosis not present

## 2023-01-22 DIAGNOSIS — F4310 Post-traumatic stress disorder, unspecified: Secondary | ICD-10-CM | POA: Diagnosis present

## 2023-01-22 DIAGNOSIS — M7981 Nontraumatic hematoma of soft tissue: Principal | ICD-10-CM | POA: Diagnosis present

## 2023-01-22 DIAGNOSIS — D72828 Other elevated white blood cell count: Secondary | ICD-10-CM | POA: Diagnosis present

## 2023-01-22 DIAGNOSIS — W2209XA Striking against other stationary object, initial encounter: Secondary | ICD-10-CM | POA: Diagnosis present

## 2023-01-22 DIAGNOSIS — D539 Nutritional anemia, unspecified: Secondary | ICD-10-CM | POA: Diagnosis present

## 2023-01-22 DIAGNOSIS — Z823 Family history of stroke: Secondary | ICD-10-CM

## 2023-01-22 DIAGNOSIS — L8990 Pressure ulcer of unspecified site, unspecified stage: Secondary | ICD-10-CM | POA: Diagnosis present

## 2023-01-22 DIAGNOSIS — F411 Generalized anxiety disorder: Secondary | ICD-10-CM | POA: Diagnosis present

## 2023-01-22 DIAGNOSIS — R112 Nausea with vomiting, unspecified: Secondary | ICD-10-CM | POA: Diagnosis not present

## 2023-01-22 DIAGNOSIS — Z5986 Financial insecurity: Secondary | ICD-10-CM

## 2023-01-22 DIAGNOSIS — W3189XA Contact with other specified machinery, initial encounter: Secondary | ICD-10-CM

## 2023-01-22 DIAGNOSIS — G589 Mononeuropathy, unspecified: Secondary | ICD-10-CM | POA: Diagnosis present

## 2023-01-22 DIAGNOSIS — E8770 Fluid overload, unspecified: Secondary | ICD-10-CM | POA: Diagnosis not present

## 2023-01-22 DIAGNOSIS — E785 Hyperlipidemia, unspecified: Secondary | ICD-10-CM | POA: Diagnosis present

## 2023-01-22 DIAGNOSIS — Z825 Family history of asthma and other chronic lower respiratory diseases: Secondary | ICD-10-CM

## 2023-01-22 DIAGNOSIS — F1011 Alcohol abuse, in remission: Secondary | ICD-10-CM | POA: Diagnosis present

## 2023-01-22 DIAGNOSIS — S6991XA Unspecified injury of right wrist, hand and finger(s), initial encounter: Secondary | ICD-10-CM | POA: Diagnosis present

## 2023-01-22 DIAGNOSIS — S301XXA Contusion of abdominal wall, initial encounter: Secondary | ICD-10-CM | POA: Diagnosis present

## 2023-01-22 DIAGNOSIS — Z8249 Family history of ischemic heart disease and other diseases of the circulatory system: Secondary | ICD-10-CM

## 2023-01-22 DIAGNOSIS — F17210 Nicotine dependence, cigarettes, uncomplicated: Secondary | ICD-10-CM | POA: Diagnosis present

## 2023-01-22 DIAGNOSIS — Z5941 Food insecurity: Secondary | ICD-10-CM

## 2023-01-22 DIAGNOSIS — R45851 Suicidal ideations: Secondary | ICD-10-CM | POA: Diagnosis present

## 2023-01-22 DIAGNOSIS — D689 Coagulation defect, unspecified: Secondary | ICD-10-CM | POA: Diagnosis present

## 2023-01-22 DIAGNOSIS — G2581 Restless legs syndrome: Secondary | ICD-10-CM | POA: Diagnosis present

## 2023-01-22 LAB — CBC WITH DIFFERENTIAL/PLATELET
Abs Immature Granulocytes: 0.04 10*3/uL (ref 0.00–0.07)
Basophils Absolute: 0.1 10*3/uL (ref 0.0–0.1)
Basophils Relative: 1 %
Eosinophils Absolute: 0.3 10*3/uL (ref 0.0–0.5)
Eosinophils Relative: 3 %
HCT: 31.8 % — ABNORMAL LOW (ref 39.0–52.0)
Hemoglobin: 11.3 g/dL — ABNORMAL LOW (ref 13.0–17.0)
Immature Granulocytes: 1 %
Lymphocytes Relative: 25 %
Lymphs Abs: 2.2 10*3/uL (ref 0.7–4.0)
MCH: 36 pg — ABNORMAL HIGH (ref 26.0–34.0)
MCHC: 35.5 g/dL (ref 30.0–36.0)
MCV: 101.3 fL — ABNORMAL HIGH (ref 80.0–100.0)
Monocytes Absolute: 1.3 10*3/uL — ABNORMAL HIGH (ref 0.1–1.0)
Monocytes Relative: 14 %
Neutro Abs: 5 10*3/uL (ref 1.7–7.7)
Neutrophils Relative %: 56 %
Platelets: 108 10*3/uL — ABNORMAL LOW (ref 150–400)
RBC: 3.14 MIL/uL — ABNORMAL LOW (ref 4.22–5.81)
RDW: 18.5 % — ABNORMAL HIGH (ref 11.5–15.5)
WBC: 8.8 10*3/uL (ref 4.0–10.5)
nRBC: 0.3 % — ABNORMAL HIGH (ref 0.0–0.2)

## 2023-01-22 LAB — COMPREHENSIVE METABOLIC PANEL
ALT: 30 U/L (ref 0–44)
AST: 85 U/L — ABNORMAL HIGH (ref 15–41)
Albumin: 2.6 g/dL — ABNORMAL LOW (ref 3.5–5.0)
Alkaline Phosphatase: 170 U/L — ABNORMAL HIGH (ref 38–126)
Anion gap: 9 (ref 5–15)
BUN: 9 mg/dL (ref 6–20)
CO2: 25 mmol/L (ref 22–32)
Calcium: 8.4 mg/dL — ABNORMAL LOW (ref 8.9–10.3)
Chloride: 97 mmol/L — ABNORMAL LOW (ref 98–111)
Creatinine, Ser: 0.89 mg/dL (ref 0.61–1.24)
GFR, Estimated: >60 mL/min (ref >60)
Glucose, Bld: 97 mg/dL (ref 70–99)
Potassium: 3.8 mmol/L (ref 3.5–5.1)
Sodium: 131 mmol/L — ABNORMAL LOW (ref 135–145)
Total Bilirubin: 6.8 mg/dL — ABNORMAL HIGH (ref 0.3–1.2)
Total Protein: 6.9 g/dL (ref 6.5–8.1)

## 2023-01-22 LAB — TROPONIN I (HIGH SENSITIVITY)
Troponin I (High Sensitivity): 6 ng/L (ref <18)
Troponin I (High Sensitivity): 6 ng/L (ref <18)

## 2023-01-22 LAB — PROTIME-INR
INR: 2 — ABNORMAL HIGH (ref 0.8–1.2)
Prothrombin Time: 22.1 seconds — ABNORMAL HIGH (ref 11.4–15.2)

## 2023-01-22 LAB — BRAIN NATRIURETIC PEPTIDE: B Natriuretic Peptide: 161.2 pg/mL — ABNORMAL HIGH (ref 0.0–100.0)

## 2023-01-22 MED ORDER — IOHEXOL 300 MG/ML  SOLN
100.0000 mL | Freq: Once | INTRAMUSCULAR | Status: AC | PRN
  Administered 2023-01-22: 100 mL via INTRAVENOUS

## 2023-01-22 MED ORDER — MORPHINE SULFATE (PF) 4 MG/ML IV SOLN
4.0000 mg | Freq: Once | INTRAVENOUS | Status: AC
  Administered 2023-01-22: 4 mg via INTRAVENOUS
  Filled 2023-01-22: qty 1

## 2023-01-22 NOTE — Assessment & Plan Note (Signed)
Chronic in the setting of history of cirrhosis.  Resume Lasix spironolactone.  Obtain electrolytes

## 2023-01-22 NOTE — Subjective & Objective (Signed)
Patient presents with hematoma of the left groin as well as swelling of his right arm has history of cirrhosis He thinks he hit his right hand yesterday and now is has significant hematoma.  Significant swelling of the left thigh as well but he is not sure of any trauma his left testicle is also very swollen.  Patient denies alcohol use now.  Not on any blood thinners

## 2023-01-22 NOTE — ED Triage Notes (Signed)
Pt reports hematoma to left groin, discoloration to penis and thighs, and right arm with swelling.  Swelling and bruising noted to right arm in triage. Pt denies fall/trauma to right arm. Denies swelling to testicle.  Hx cirrhosis.

## 2023-01-22 NOTE — H&P (Signed)
Nathaniel Warren [id number] DOB: [birth year] DOA: 01/22/2023     PCP: [name], NP   Outpatient Specialists:      G Dr. Rush Landmark (  [facility])    Patient arrived to ER on 01/22/23 at 1756 Referred by Attending Drenda Freeze, MD   Patient coming from:    home Lives  With  friend    Chief Complaint:   Chief Complaint  Patient presents with   Hand Pain   Groin Swelling    HPI: Nathaniel Warren is a 57 y.o. 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  Significant swelling of the left thigh as well but he is not sure of any trauma his left testicle is also very swollen.  Patient denies alcohol use now.  Not on any blood thinners   Hx of chest wall hematoma in the past  He is ready to quit smoking   Pt states he is suicidal  Ready to quit it all  Reports has access to "everything" Reports will do whatever first thing that comes up  Pt recently lost his son and mother  Regarding pertinent Chronic problems:        Liver disease MELD 3.0: 25 at 01/22/2023  7:28 PM     Chronic anemia - baseline hg Hemoglobin & Hematocrit  Recent Labs    12/05/22 0154 12/18/22 1222 01/22/23 1928  HGB 8.6* 12.0* 11.3*     While in ER:    Noted to have hemoglobin of 11.3 platelets of 108 INR elevated 2.0  [place]   Ordered No evidence for testicular torsion or epididymal orchitis.   Heterogeneous parenchyma of the right testicle without a discrete or focal mass lesion.   No acute osseous abnormality   CTabd/pelvis - enteritis   Following Medications were ordered in ER: Medications  morphine (PF) 4 MG/ML injection 4 mg (4 mg Intravenous  Given 01/22/23 2057)  iohexol (OMNIPAQUE) 300 MG/ML solution 100 mL (100 mLs Intravenous Contrast Given 01/22/23 2107)       ED Triage Vitals  Enc Vitals Group     BP 01/22/23 1815 104/65     Pulse Rate 01/22/23 1815 79     Resp 01/22/23 1815 20     Temp 01/22/23 1815 98 F (36.7 C)     Temp Source 01/22/23 1920 Oral     SpO2 01/22/23 1815 98 %     Weight --      Height --      Head Circumference --      Peak Flow --      Pain Score 01/22/23 1826 5     Pain Loc --      Pain Edu? --      Excl. in [place]? --   TMAX(24)@     _________________________________________ Significant initial  Findings: Abnormal Labs Reviewed  CBC WITH DIFFERENTIAL/PLATELET - Abnormal; Notable for the following components:      Result Value   RBC 3.14 (*)    Hemoglobin 11.3 (*)    HCT 31.8 (*)    MCV 101.3 (*)    MCH 36.0 (*)    RDW  18.5 (*)    Platelets 108 (*)    nRBC 0.3 (*)    Monocytes Absolute 1.3 (*)    All other components within normal limits  COMPREHENSIVE METABOLIC PANEL - Abnormal; Notable for the following components:   Sodium 131 (*)    Chloride 97 (*)    Calcium 8.4 (*)    Albumin 2.6 (*)    AST 85 (*)    Alkaline Phosphatase 170 (*)    Total Bilirubin 6.8 (*)    All other components within normal limits  BRAIN NATRIURETIC PEPTIDE - Abnormal; Notable for the following components:   B Natriuretic Peptide 161.2 (*)    All other components within normal limits  PROTIME-INR - Abnormal; Notable for the following components:   Prothrombin Time 22.1 (*)    INR 2.0 (*)    All other components within normal limits    _________________________ Troponin 6 ECG: Ordered Personally reviewed and interpreted by me showing: HR : 68 Rhythm:    Sinus rhythm Left anterior fascicular block Minimal ST depression, lateral leads Borderline prolonged QT interva  QTC 481   The recent clinical data is shown below. Vitals:   01/22/23 2049 01/22/23 2126 01/22/23 2145 01/22/23 2200  BP: 110/67    122/61  Pulse: 64 64 68 75  Resp: 19 18 10 10  $ Temp:      TempSrc:      SpO2: 99% 97% 98% 98%    WBC     Component Value Date/Time   WBC 8.8 01/22/2023 1928   LYMPHSABS 2.2 01/22/2023 1928   MONOABS 1.3 (H) 01/22/2023 1928   EOSABS 0.3 01/22/2023 1928   BASOSABS 0.1 01/22/2023 1928       UA   ordered     _____________________________________ Hospitalist was called for admission for   Hematoma of groin   And right hand    The following Work up has been ordered so far:  Orders Placed This Encounter  Procedures   DG Hand Complete Right   DG Femur Min 2 Views Left   DG Forearm Right   US SCROTUM W/DOPPLER   CT ABDOMEN PELVIS W CONTRAST   CBC with Differential   Comprehensive metabolic panel   Brain natriuretic peptide   Protime-INR   Consult to social work   Consult to hospitalist   EKG 12-Lead   UE Venous Duplex ([facility] and [facility] ONLY)     OTHER Significant initial  Findings:  labs showing:  Recent Labs  Lab 01/22/23 1928  NA 131*  K 3.8  CO2 25  GLUCOSE 97  BUN 9  CREATININE 0.89  CALCIUM 8.4*    Cr  stable,   Lab Results  Component Value Date   CREATININE 0.89 01/22/2023   CREATININE 0.89 12/18/2022   CREATININE 1.03 12/04/2022    Recent Labs  Lab 01/22/23 1928  AST 85*  ALT 30  ALKPHOS 170*  BILITOT 6.8*  PROT 6.9  ALBUMIN 2.6*   Lab Results  Component Value Date   CALCIUM 8.4 (L) 01/22/2023   PHOS 3.5 06/18/2022    Plt: Lab Results  Component Value Date   PLT 108 (L) 01/22/2023    COVID-19 Labs  No results for input(s): "DDIMER", "FERRITIN", "LDH", "CRP" in the last 72 hours.  Lab Results  Component Value Date   SARSCOV2NAA NEGATIVE 12/04/2022        Recent Labs  Lab 01/22/23 1928  WBC 8.8  NEUTROABS 5.0  HGB 11.3*  HCT 31.8*  MCV 101.3*  PLT 108*    HG/HCT  stable,       Component Value Date/Time   HGB 11.3 (L) 01/22/2023 1928   HGB 12.0 (L) 12/18/2022 1222   HCT 31.8 (L) 01/22/2023 1928   HCT 31.9 (L)  12/18/2022 1222   MCV 101.3 (H) 01/22/2023 1928   MCV 96 12/18/2022 1222      BNP (last 3 results) Recent Labs    02/21/22 0835 01/22/23 1928  BNP 254.4* 161.2*     Cultures:    Component Value Date/Time   SDES PERITONEAL 06/19/2022 1526   SDES PERITONEAL 06/19/2022 1526   SPECREQUEST NONE 06/19/2022 1526   SPECREQUEST NONE 06/19/2022 1526   CULT  06/19/2022 1526    NO GROWTH 5 DAYS Performed at [facility], [place] [address] [place], [place] [address]    REPTSTATUS 06/24/2022 FINAL 06/19/2022 1526   REPTSTATUS 06/19/2022 FINAL 06/19/2022 1526     Radiological Exams on Admission: CT ABDOMEN PELVIS W CONTRAST  Result Date: 01/22/2023 CLINICAL DATA:  Acute abdominal pain EXAM: CT ABDOMEN AND PELVIS WITH CONTRAST TECHNIQUE: Multidetector CT imaging of the abdomen and pelvis was performed using the standard protocol following bolus administration of intravenous contrast. RADIATION DOSE REDUCTION: This exam was performed according to the departmental dose-optimization program which includes automated exposure control, adjustment of the mA and/or kV according to patient size and/or use of iterative reconstruction technique. CONTRAST:  155m OMNIPAQUE IOHEXOL 300 MG/ML  SOLN COMPARISON:  CT chest abdomen and pelvis 12/01/2022 FINDINGS: Lower chest: No acute abnormality. Hepatobiliary: Gallbladder is dilated. Small gallstones are present. There is mild gallbladder wall edema. Common bile duct is slightly prominent in size. No focal liver lesions are seen. There is no intrahepatic biliary ductal dilatation. Pancreas: Unremarkable. No pancreatic ductal dilatation or surrounding inflammatory changes. Spleen: Normal in size without focal abnormality. Adrenals/Urinary Tract: Adrenal glands are unremarkable. Kidneys are normal, without renal calculi, focal lesion, or hydronephrosis. Bladder is unremarkable. Stomach/Bowel: There is some wall thickening of small bowel loops in the left abdomen  with associated mesenteric edema. There is no evidence for bowel obstruction. There is no pneumatosis or free air. There is colonic diverticulosis without evidence for acute diverticulitis. The appendix is within normal limits. Vascular/Lymphatic: Aortic atherosclerosis. No enlarged abdominal or pelvic lymph nodes. Reproductive: Prostate is unremarkable. Other: There is a small supraumbilical ventral hernia containing mesenteric fat and a small amount of free fluid. There is small volume ascites throughout the abdomen and pelvis. There is a small fat containing left inguinal hernia. Musculoskeletal: Multilevel degenerative changes affect the spine IMPRESSION: 1. Wall thickening of small bowel loops in the left abdomen with associated mesenteric edema. Findings are compatible with nonspecific enteritis. 2. Small volume ascites. 3. Cholelithiasis with gallbladder wall edema and prominence of the common bile duct. Correlate clinically for acute cholecystitis. Consider ultrasound. 4. Colonic diverticulosis without evidence for acute diverticulitis. 5. Small supraumbilical ventral hernia containing mesenteric fat and free fluid. Aortic Atherosclerosis (ICD10-I70.0). Electronically Signed   By: ARonney AstersM.D.   On: 01/22/2023 21:32   DG Femur Min 2 Views Left  Result Date: 01/22/2023 CLINICAL DATA:  Left thigh hematoma EXAM: LEFT FEMUR 2 VIEWS COMPARISON:  None Available. FINDINGS: There is no evidence of fracture or other focal bone lesions. Soft tissues are unremarkable. IMPRESSION: No acute osseous abnormality Electronically Signed   By: AJill SideM.D.   On: 01/22/2023 20:04   DG Forearm Right  Result Date: 01/22/2023 CLINICAL DATA:  Right arm swelling and bruising. EXAM: RIGHT FOREARM - 2 VIEW COMPARISON:  None Available. FINDINGS: There is no evidence of fracture or other focal bone lesions. Soft tissue swelling is present over the dorsum of the right hand. IMPRESSION: No acute osseous abnormality.  Electronically Signed   By: Brett Fairy M.D.   On: 01/22/2023 20:04   DG Hand Complete Right  Result Date: 01/22/2023 CLINICAL DATA:  Right arm swelling.  No history of trauma. EXAM: RIGHT HAND - COMPLETE 3+ VIEW COMPARISON:  08/03/2022 FINDINGS: Degenerative changes in the interphalangeal joints. Old fracture deformity of the right fifth metacarpal bone. Prominent dorsal soft tissue swelling. No soft tissue gas or radiopaque foreign bodies. No evidence of acute fracture or dislocation. IMPRESSION: 1. Diffuse soft tissue swelling over the dorsum of the right hand. 2. No acute fracture or dislocation. Old fracture deformity of the fifth metacarpal bone. Electronically Signed   By: Lucienne Capers M.D.   On: 01/22/2023 20:01   US SCROTUM W/DOPPLER  Result Date: 01/22/2023 CLINICAL DATA:  Testicle pain. EXAM: SCROTAL ULTRASOUND DOPPLER ULTRASOUND OF THE TESTICLES TECHNIQUE: Complete ultrasound examination of the testicles, epididymis, and other scrotal structures was performed. Color and spectral Doppler ultrasound were also utilized to evaluate blood flow to the testicles. COMPARISON:  None Available. FINDINGS: Right testicle Measurements: 3.1 x 2.2 x 2.8 cm. Heterogeneous parenchyma without a discrete or focal mass lesion evident. Tiny 4 x 3 x 3 mm peripheral simple cyst identified. Left testicle Measurements: 3.4 x 1.5 x 2.3 cm. No mass or microlithiasis visualized. Right epididymis:  4 mm epididymal cyst or spermatocele. Left epididymis:  Normal in size and appearance. Hydrocele:  None visualized. Varicocele: Venous channels in the right hemiscrotum dilate up to 2.8 mm without solid maneuver. Pulsed Doppler interrogation of both testes demonstrates normal low resistance arterial and venous waveforms bilaterally. IMPRESSION: 1. No evidence for testicular torsion or epididymal orchitis. 2. Heterogeneous parenchyma of the right testicle without a discrete or focal mass lesion. This is a nonspecific finding  and could be related to prior trauma or infiltrative process. Consider follow-up ultrasound in 3 months to assess for stability. 3. 4 mm epididymal cyst or spermatocele in the right epididymis. 4. Borderline right-sided varicocele. Venous channels in the right hemiscrotum dilate up to 2.8 mm without solid maneuver. Electronically Signed   By: Misty Stanley M.D.   On: 01/22/2023 19:53   _______________________________________________________________________________________________________ Latest  Blood pressure 122/61, pulse 75, temperature 97.7 F (36.5 C), temperature source Oral, resp. rate 10, SpO2 98 %.   Vitals  labs and radiology finding personally reviewed  Review of Systems:    Pertinent positives include: Left leg hematoma and right arm hematoma  Constitutional:  No weight loss, night sweats, Fevers, chills, fatigue, weight loss  HEENT:  No headaches, Difficulty swallowing,Tooth/dental problems,Sore throat,  No sneezing, itching, ear ache, nasal congestion, post nasal drip,  Cardio-vascular:  No chest pain, Orthopnea, PND, anasarca, dizziness, palpitations.no Bilateral lower extremity swelling  GI:  No heartburn, indigestion, abdominal pain, nausea, vomiting, diarrhea, change in bowel habits, loss of appetite, melena, blood in stool, hematemesis Resp:  no shortness of breath at rest. No dyspnea on exertion, No excess mucus, no productive cough, No non-productive cough, No coughing up of blood.No change in color of mucus.No wheezing. Skin:  no rash or lesions. No jaundice GU:  no dysuria, change in color of urine, no urgency or frequency. No straining to urinate.  No flank pain.  Musculoskeletal:  No joint pain  or no joint swelling. No decreased range of motion. No back pain.  Psych:  No change in mood or affect. No depression or anxiety. No memory loss.  Neuro: no localizing neurological complaints, no tingling, no weakness, no double vision, no gait abnormality, no slurred  speech, no confusion  All systems reviewed and apart from [place] all are negative _______________________________________________________________________________________________ Past Medical History:   Past Medical History:  Diagnosis Date   Depression    Hernia, abdominal    Liver failure ([place])    Seizures ([place])    Stroke [facility])       Past Surgical History:  Procedure Laterality Date   ANKLE SURGERY     ANKLE SURGERY Right    HERNIA REPAIR     SPLENECTOMY      Social History:  Ambulatory  cane, walker       reports that he has been smoking cigarettes. He has a 30.00 pack-year smoking history. He has quit using smokeless tobacco. He reports that he does not currently use alcohol. He reports current drug use. Drug: Marijuana.   Family History:  Family History  Problem Relation Age of Onset   COPD Mother    Diabetes Father    Stroke Maternal Grandmother    Alzheimer's disease Maternal Grandfather    Cancer Paternal Grandmother    Stomach cancer Paternal Grandfather    Hypertension Other    Colon cancer Neg Hx    Esophageal cancer Neg Hx    Colon polyps Neg Hx    Inflammatory bowel disease Neg Hx    Liver disease Neg Hx    Pancreatic cancer Neg Hx    Rectal cancer Neg Hx    ______________________________________________________________________________________________ Allergies: No Known Allergies   Prior to Admission medications   Medication Sig Start Date End Date Taking? Authorizing Provider  furosemide (LASIX) 40 MG tablet Take 1 tablet (40 mg total) by mouth daily. 12/18/22  Yes [name], NP  spironolactone (ALDACTONE) 50 MG tablet Take 1 tablet (50 mg total) by mouth in the morning. 12/18/22  Yes [name], NP  gabapentin (NEURONTIN) 300 MG capsule Take 1 capsule (300 mg total) by mouth 3 (three) times daily. For withdrawals Patient not taking: Reported on 01/22/2023 12/18/22   [name], NP  ondansetron (ZOFRAN-ODT) 4 MG disintegrating  tablet Take 1 tablet (4 mg total) by mouth every 8 (eight) hours as needed for nausea or vomiting. Patient not taking: Reported on 01/22/2023 09/22/22   Kommor, Debe Coder, MD  PARoxetine (PAXIL) 20 MG tablet Take 1 tablet (20 mg total) by mouth daily. Patient not taking: Reported on 01/22/2023 09/11/22 12/03/23  Lorenda Peck A  potassium chloride SA (KLOR-CON M) 20 MEQ tablet Take 1 tablet (20 mEq total) by mouth 2 (two) times daily for 2 days. Patient not taking: Reported on 01/22/2023 04/28/22 01/22/23  Esterwood, Amy S, PA-C  QUEtiapine (SEROQUEL) 100 MG tablet Take 1 tablet (100 mg total) by mouth at bedtime. Patient not taking: Reported on 01/06/2023 09/11/22   Lorenda Peck A  traZODone (DESYREL) 100 MG tablet Take 1 tablet (100 mg total) by mouth at bedtime. Patient not taking: Reported on 01/06/2023 12/18/22 03/18/23  [name], NP    ___________________________________________________________________________________________________ Physical Exam:    01/22/2023   10:00 PM 01/22/2023    9:45 PM 01/22/2023    9:26 PM  Vitals with BMI  Systolic [id number]    Diastolic 61    Pulse 75 68 64  1. General:  in No  Acute distress    Chronically ill  -appearing 2. Psychological: Alert and   Oriented 3. Head/ENT:    Dry Mucous Membranes                          Head Non traumatic, neck supple                       Poor Dentition 4. SKIN:  decreased Skin turgor,  Skin clean Dry and intact no rash jaundiced     5. Heart: Regular rate and rhythm no Murmur, no Rub or gallop 6. Lungs:  no wheezes or crackles   7. Abdomen: Soft,  non-tender, Non distended  bowel sounds present 8. Lower extremities: no clubbing, cyanosis, no  edema 9. Neurologically Grossly intact, moving all 4 extremities equally   10. MSK: Normal range of motion    Chart has been reviewed  ______________________________________________________________________________________________  Assessment/Plan 57 y.o. 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  Suspect spontaneous hematomas could be related to history of alcohol abuse and elevated INR.  Continue to monitor also noted elevated bilirubin And hyponatremia. Resume Lasix and spironolactone   Spontaneous hematoma of lower leg Also spontaneous hematoma of the right upper extremity.  Continue to monitor serial CBC.  Monitor and progressive vascular and neurochecks every 4 hours  Hyponatremia Chronic in the setting of history of cirrhosis.  Resume Lasix spironolactone.  Obtain electrolytes  Tobacco abuse  - Spoke about importance of quitting spent 5 minutes discussing options for treatment, prior attempts at quitting, and dangers of smoking  -At this point patient is     interested in quitting  -  refused nicotine patch   - nursing tobacco cessation protocol   Suicidal ideations Consult behavioral health Suicidal precautions  Pt states he wants to go home and plans to "end it all" IVC paper work completed   Other plan as per orders.  DVT prophylaxis:  SCD       Code Status:    Code Status: Prior FULL CODE     Family Communication:   Family not at  Bedside    Disposition Plan:     To home once workup is complete and patient is stable   Following barriers for discharge:                            Electrolytes corrected                               Anemia stable  Will need consultants to  evaluate patient prior to discharge    Would benefit from PT/OT eval prior to DC  [employer] health  consulted                    Consults called: none  Admission status:  ED Disposition     ED Disposition  [place]: [place] [[id number]]  Level of Care: Progressive [102]  Admit to Progressive based on following criteria: MULTISYSTEM THREATS such as stable sepsis, metabolic/electrolyte imbalance with or without encephalopathy that is responding to early treatment.  May place patient in observation at [facility] or [place] if equivalent level of care is available:: No  Covid Evaluation: Asymptomatic - no recent exposure (last 10 days) testing not required  Diagnosis: Spontaneous hematoma of lower leg [id number]  Admitting Physician: Toy Baker [[id number]]  Attending Physician: Toy Baker [[id number]]          Obs    Level of care         progressive tele indefinitely please discontinue once patient no longer qualifies COVID-19 Labs   Rhilee Currin 01/23/2023, 1:02 AM    [facility]     after 2 AM please page floor coverage PA If 7AM-7PM, please contact the day team taking care of the patient using [email or web address]   Patient was evaluated in the context of the global COVID-19 pandemic, which necessitated consideration that the patient might be at risk for infection with the SARS-CoV-2 virus that causes COVID-19. Institutional protocols and algorithms that pertain to the evaluation of patients at risk for COVID-19 are in a state of rapid change based on information released by regulatory bodies including the CDC and federal and state organizations. These policies and algorithms were followed during the patient's care.    Hepatic cirrhosis

## 2023-01-22 NOTE — ED Provider Notes (Signed)
[facility] Provider Note   CSN: [id number] Arrival date & time: 01/22/23  1756     History  Chief Complaint  Patient presents with   Hand Pain   Groin Swelling    Nathaniel Warren is a 57 y.o. male history of alcohol cirrhosis, depression, PTSD, here presenting with hematoma.  He states that he accidentally hit his right hand yesterday and now it swollen up.  Patient also noticed some swelling in the left thigh area.  He denies any trauma or injury to the area.  He states that his left testicle area is also swollen.  Patient has a history of alcohol cirrhosis.  He states that he is no longer drinking alcohol.  Patient is not currently on any blood thinners.  Patient states that he has no clotting or bleeding disorders that he knows of.  The history is provided by the patient.       Home Medications Prior to Admission medications   Medication Sig Start Date End Date Taking? Authorizing Provider  furosemide (LASIX) 40 MG tablet Take 1 tablet (40 mg total) by mouth daily. 12/18/22   [name], NP  gabapentin (NEURONTIN) 300 MG capsule Take 1 capsule (300 mg total) by mouth 3 (three) times daily. For withdrawals Patient not taking: Reported on 01/06/2023 12/18/22   [name], NP  ondansetron (ZOFRAN-ODT) 4 MG disintegrating tablet Take 1 tablet (4 mg total) by mouth every 8 (eight) hours as needed for nausea or vomiting. Patient not taking: Reported on 01/06/2023 09/22/22   Kommor, Debe Coder, MD  PARoxetine (PAXIL) 20 MG tablet Take 1 tablet (20 mg total) by mouth daily. 09/11/22 12/03/23  Bahraini, Sarah A  potassium chloride SA (KLOR-CON M) 20 MEQ tablet Take 1 tablet (20 mEq total) by mouth 2 (two) times daily for 2 days. Patient not taking: Reported on 08/05/2022 04/28/22 04/30/22  Esterwood, Amy S, PA-C  QUEtiapine (SEROQUEL) 100 MG tablet Take 1 tablet (100 mg total) by mouth at bedtime. Patient not taking: Reported on 01/06/2023  09/11/22   Alda Berthold  spironolactone (ALDACTONE) 50 MG tablet Take 1 tablet (50 mg total) by mouth in the morning. 12/18/22   [name], NP  traZODone (DESYREL) 100 MG tablet Take 1 tablet (100 mg total) by mouth at bedtime. Patient not taking: Reported on 01/06/2023 12/18/22 03/18/23  [name], NP      Allergies    Patient has no known allergies.    Review of Systems   Review of Systems  Skin:  Positive for pallor.  All other systems reviewed and are negative.   Physical Exam Updated Vital Signs BP 113/75 (BP Location: Left Arm)   Pulse 61   Temp 97.7 F (36.5 C) (Oral)   Resp 10   SpO2 100%  Physical Exam Vitals and nursing note reviewed.  Constitutional:      Appearance: Normal appearance.  HENT:     Head: Normocephalic.     Nose: Nose normal.     Mouth/Throat:     Mouth: Mucous membranes are moist.  Eyes:     Extraocular Movements: Extraocular movements intact.     Pupils: Pupils are equal, round, and reactive to light.  Cardiovascular:     Rate and Rhythm: Normal rate and regular rhythm.     Pulses: Normal pulses.     Heart sounds: Normal heart sounds.  Pulmonary:     Effort: Pulmonary effort is normal.  Breath sounds: Normal breath sounds.  Abdominal:     General: Abdomen is flat.     Comments: Slightly distended but no tenderness  Genitourinary:    Comments: Some ecchymosis and testicular swelling bilaterally. Musculoskeletal:        General: Normal range of motion.     Cervical back: Normal range of motion and neck supple.     Comments: Patient has impressive right hand swelling.  There is ecchymosis of the entire hand involving the forearm.  Patient also has left thigh ecchymosis.  Questionable telangiectasia   Skin:    General: Skin is warm.     Capillary Refill: Capillary refill takes less than 2 seconds.  Neurological:     General: No focal deficit present.     Mental Status: He is alert and oriented to person, place, and time.   Psychiatric:        Mood and Affect: Mood normal.        Behavior: Behavior normal.     ED Results / Procedures / Treatments   Labs (all labs ordered are listed, but only abnormal results are displayed) Labs Reviewed  CBC WITH DIFFERENTIAL/PLATELET - Abnormal; Notable for the following components:      Result Value   RBC 3.14 (*)    Hemoglobin 11.3 (*)    HCT 31.8 (*)    MCV 101.3 (*)    MCH 36.0 (*)    RDW 18.5 (*)    Platelets 108 (*)    nRBC 0.3 (*)    Monocytes Absolute 1.3 (*)    All other components within normal limits  PROTIME-INR - Abnormal; Notable for the following components:   Prothrombin Time 22.1 (*)    INR 2.0 (*)    All other components within normal limits  COMPREHENSIVE METABOLIC PANEL  BRAIN NATRIURETIC PEPTIDE  TROPONIN I (HIGH SENSITIVITY)    EKG EKG Interpretation  Date/Time:  Thursday January 22 2023 18:32:24 EST Ventricular Rate:  68 PR Interval:  137 QRS Duration: 100 QT Interval:  452 QTC Calculation: 481 R Axis:   -65 Text Interpretation: Sinus rhythm Left anterior fascicular block Minimal ST depression, lateral leads Borderline prolonged QT interval No significant change since last tracing Confirmed by Wandra Arthurs [phone]) on 01/22/2023 7:35:10 PM  Radiology DG Hand Complete Right  Result Date: 01/22/2023 CLINICAL DATA:  Right arm swelling.  No history of trauma. EXAM: RIGHT HAND - COMPLETE 3+ VIEW COMPARISON:  08/03/2022 FINDINGS: Degenerative changes in the interphalangeal joints. Old fracture deformity of the right fifth metacarpal bone. Prominent dorsal soft tissue swelling. No soft tissue gas or radiopaque foreign bodies. No evidence of acute fracture or dislocation. IMPRESSION: 1. Diffuse soft tissue swelling over the dorsum of the right hand. 2. No acute fracture or dislocation. Old fracture deformity of the fifth metacarpal bone. Electronically Signed   By: Lucienne Capers M.D.   On: 01/22/2023 20:01   US SCROTUM  W/DOPPLER  Result Date: 01/22/2023 CLINICAL DATA:  Testicle pain. EXAM: SCROTAL ULTRASOUND DOPPLER ULTRASOUND OF THE TESTICLES TECHNIQUE: Complete ultrasound examination of the testicles, epididymis, and other scrotal structures was performed. Color and spectral Doppler ultrasound were also utilized to evaluate blood flow to the testicles. COMPARISON:  None Available. FINDINGS: Right testicle Measurements: 3.1 x 2.2 x 2.8 cm. Heterogeneous parenchyma without a discrete or focal mass lesion evident. Tiny 4 x 3 x 3 mm peripheral simple cyst identified. Left testicle Measurements: 3.4 x 1.5 x 2.3 cm. No mass or microlithiasis visualized.  Right epididymis:  4 mm epididymal cyst or spermatocele. Left epididymis:  Normal in size and appearance. Hydrocele:  None visualized. Varicocele: Venous channels in the right hemiscrotum dilate up to 2.8 mm without solid maneuver. Pulsed Doppler interrogation of both testes demonstrates normal low resistance arterial and venous waveforms bilaterally. IMPRESSION: 1. No evidence for testicular torsion or epididymal orchitis. 2. Heterogeneous parenchyma of the right testicle without a discrete or focal mass lesion. This is a nonspecific finding and could be related to prior trauma or infiltrative process. Consider follow-up ultrasound in 3 months to assess for stability. 3. 4 mm epididymal cyst or spermatocele in the right epididymis. 4. Borderline right-sided varicocele. Venous channels in the right hemiscrotum dilate up to 2.8 mm without solid maneuver. Electronically Signed   By: Misty Stanley M.D.   On: 01/22/2023 19:53    Procedures Procedures    Medications Ordered in ED Medications - No data to display  ED Course/ Medical Decision Making/ A&P                             Medical Decision Making Nathaniel Warren is a 57 y.o. male here with multiple hematoma.  Patient has a history of cirrhosis.  Patient bumped his hand and has a hematoma.  Patient has no particular  trauma to the groin but also has a hematoma in the scrotum and thigh.  I am concerned for his worsening liver function.  Plan to get CBC and CMP and LFTs and PT/INR and x-rays of the hand and also ultrasound of the scrotum and x-ray of the thigh.  10:26 PM I reviewed patient's labs and independently interpreted ultrasound and CT scans.  Labs showed normal CBC and hemoglobin is 11.3.  Patient's INR is 2.  Patient CT abdomen pelvis showed enteritis.  X-rays show no acute fracture.  Patient's ultrasound did not show any torsion.  I do not know why he has multiple hematomas.  Patient is in significant pain.  Will admit for pain control observation.   Problems Addressed: Hematoma of groin, initial encounter: acute illness or injury Swelling of right hand: acute illness or injury  Amount and/or Complexity of Data Reviewed Labs: ordered. Decision-making details documented in ED Course. Radiology: ordered and independent interpretation performed. Decision-making details documented in ED Course. ECG/medicine tests: ordered and independent interpretation performed. Decision-making details documented in ED Course.  Risk Prescription drug management.    Final Clinical Impression(s) / ED Diagnoses Final diagnoses:  None    Rx / DC Orders ED Discharge Orders     None         Drenda Freeze, MD 01/22/23 2227

## 2023-01-22 NOTE — ED Notes (Signed)
Patient transported to CT 

## 2023-01-22 NOTE — ED Notes (Signed)
[place] at bedside will return for lab collection

## 2023-01-22 NOTE — Assessment & Plan Note (Signed)
Also spontaneous hematoma of the right upper extremity.  Continue to monitor serial CBC.  Monitor and progressive vascular and neurochecks every 4 hours

## 2023-01-22 NOTE — Assessment & Plan Note (Signed)
Pt states he is no longer drinking  MELD MELD 3.0: 25 at 01/22/2023  7:28 PM At risk for complications.  Suspect spontaneous hematomas could be related to history of alcohol abuse and elevated INR.  Continue to monitor also noted elevated bilirubin And hyponatremia. Resume Lasix and spironolactone

## 2023-01-23 ENCOUNTER — Inpatient Hospital Stay (HOSPITAL_COMMUNITY): Payer: Medicaid Other

## 2023-01-23 ENCOUNTER — Encounter (HOSPITAL_COMMUNITY): Payer: Self-pay | Admitting: Internal Medicine

## 2023-01-23 ENCOUNTER — Observation Stay (HOSPITAL_BASED_OUTPATIENT_CLINIC_OR_DEPARTMENT_OTHER): Payer: Medicaid Other

## 2023-01-23 DIAGNOSIS — D649 Anemia, unspecified: Secondary | ICD-10-CM | POA: Diagnosis not present

## 2023-01-23 DIAGNOSIS — R45851 Suicidal ideations: Secondary | ICD-10-CM

## 2023-01-23 DIAGNOSIS — G894 Chronic pain syndrome: Secondary | ICD-10-CM | POA: Diagnosis present

## 2023-01-23 DIAGNOSIS — D6959 Other secondary thrombocytopenia: Secondary | ICD-10-CM | POA: Diagnosis present

## 2023-01-23 DIAGNOSIS — M7989 Other specified soft tissue disorders: Secondary | ICD-10-CM

## 2023-01-23 DIAGNOSIS — F1011 Alcohol abuse, in remission: Secondary | ICD-10-CM | POA: Diagnosis present

## 2023-01-23 DIAGNOSIS — S61335A Puncture wound without foreign body of left ring finger with damage to nail, initial encounter: Secondary | ICD-10-CM | POA: Diagnosis present

## 2023-01-23 DIAGNOSIS — M7981 Nontraumatic hematoma of soft tissue: Secondary | ICD-10-CM | POA: Diagnosis present

## 2023-01-23 DIAGNOSIS — Z833 Family history of diabetes mellitus: Secondary | ICD-10-CM | POA: Diagnosis not present

## 2023-01-23 DIAGNOSIS — D689 Coagulation defect, unspecified: Secondary | ICD-10-CM | POA: Diagnosis present

## 2023-01-23 DIAGNOSIS — E871 Hypo-osmolality and hyponatremia: Secondary | ICD-10-CM | POA: Diagnosis present

## 2023-01-23 DIAGNOSIS — K7031 Alcoholic cirrhosis of liver with ascites: Secondary | ICD-10-CM | POA: Diagnosis not present

## 2023-01-23 DIAGNOSIS — E785 Hyperlipidemia, unspecified: Secondary | ICD-10-CM | POA: Diagnosis present

## 2023-01-23 DIAGNOSIS — D539 Nutritional anemia, unspecified: Secondary | ICD-10-CM | POA: Diagnosis present

## 2023-01-23 DIAGNOSIS — R233 Spontaneous ecchymoses: Secondary | ICD-10-CM

## 2023-01-23 DIAGNOSIS — L089 Local infection of the skin and subcutaneous tissue, unspecified: Secondary | ICD-10-CM | POA: Diagnosis not present

## 2023-01-23 DIAGNOSIS — W3189XA Contact with other specified machinery, initial encounter: Secondary | ICD-10-CM | POA: Diagnosis not present

## 2023-01-23 DIAGNOSIS — F332 Major depressive disorder, recurrent severe without psychotic features: Secondary | ICD-10-CM | POA: Diagnosis not present

## 2023-01-23 DIAGNOSIS — F17210 Nicotine dependence, cigarettes, uncomplicated: Secondary | ICD-10-CM | POA: Diagnosis not present

## 2023-01-23 DIAGNOSIS — E8770 Fluid overload, unspecified: Secondary | ICD-10-CM | POA: Diagnosis not present

## 2023-01-23 DIAGNOSIS — N179 Acute kidney failure, unspecified: Secondary | ICD-10-CM | POA: Diagnosis not present

## 2023-01-23 DIAGNOSIS — D62 Acute posthemorrhagic anemia: Secondary | ICD-10-CM | POA: Diagnosis not present

## 2023-01-23 DIAGNOSIS — D72828 Other elevated white blood cell count: Secondary | ICD-10-CM | POA: Diagnosis present

## 2023-01-23 DIAGNOSIS — F4310 Post-traumatic stress disorder, unspecified: Secondary | ICD-10-CM | POA: Diagnosis not present

## 2023-01-23 DIAGNOSIS — L8992 Pressure ulcer of unspecified site, stage 2: Secondary | ICD-10-CM | POA: Diagnosis not present

## 2023-01-23 DIAGNOSIS — I96 Gangrene, not elsewhere classified: Secondary | ICD-10-CM | POA: Diagnosis present

## 2023-01-23 DIAGNOSIS — I82622 Acute embolism and thrombosis of deep veins of left upper extremity: Secondary | ICD-10-CM | POA: Diagnosis not present

## 2023-01-23 DIAGNOSIS — R2232 Localized swelling, mass and lump, left upper limb: Secondary | ICD-10-CM | POA: Diagnosis not present

## 2023-01-23 DIAGNOSIS — Z8673 Personal history of transient ischemic attack (TIA), and cerebral infarction without residual deficits: Secondary | ICD-10-CM | POA: Diagnosis not present

## 2023-01-23 DIAGNOSIS — S301XXA Contusion of abdominal wall, initial encounter: Secondary | ICD-10-CM | POA: Diagnosis not present

## 2023-01-23 DIAGNOSIS — G2581 Restless legs syndrome: Secondary | ICD-10-CM | POA: Diagnosis present

## 2023-01-23 DIAGNOSIS — F411 Generalized anxiety disorder: Secondary | ICD-10-CM | POA: Diagnosis present

## 2023-01-23 DIAGNOSIS — W2209XA Striking against other stationary object, initial encounter: Secondary | ICD-10-CM | POA: Diagnosis present

## 2023-01-23 LAB — RAPID URINE DRUG SCREEN, HOSP PERFORMED
Amphetamines: NOT DETECTED
Barbiturates: NOT DETECTED
Benzodiazepines: NOT DETECTED
Cocaine: NOT DETECTED
Opiates: POSITIVE — AB
Tetrahydrocannabinol: POSITIVE — AB

## 2023-01-23 LAB — URINALYSIS, COMPLETE (UACMP) WITH MICROSCOPIC
Bacteria, UA: NONE SEEN
Bilirubin Urine: NEGATIVE
Glucose, UA: NEGATIVE mg/dL
Hgb urine dipstick: NEGATIVE
Ketones, ur: NEGATIVE mg/dL
Leukocytes,Ua: NEGATIVE
Nitrite: NEGATIVE
Protein, ur: NEGATIVE mg/dL
Specific Gravity, Urine: 1.044 — ABNORMAL HIGH (ref 1.005–1.030)
pH: 5 (ref 5.0–8.0)

## 2023-01-23 LAB — TYPE AND SCREEN
ABO/RH(D): O POS
Antibody Screen: NEGATIVE

## 2023-01-23 LAB — CBC
HCT: 30.6 % — ABNORMAL LOW (ref 39.0–52.0)
HCT: 29.7 % — ABNORMAL LOW (ref 39.0–52.0)
HCT: 29.1 % — ABNORMAL LOW (ref 39.0–52.0)
HCT: 28.6 % — ABNORMAL LOW (ref 39.0–52.0)
HCT: 28.5 % — ABNORMAL LOW (ref 39.0–52.0)
Hemoglobin: 10.9 g/dL — ABNORMAL LOW (ref 13.0–17.0)
Hemoglobin: 10.8 g/dL — ABNORMAL LOW (ref 13.0–17.0)
Hemoglobin: 10.4 g/dL — ABNORMAL LOW (ref 13.0–17.0)
Hemoglobin: 10.2 g/dL — ABNORMAL LOW (ref 13.0–17.0)
Hemoglobin: 10.3 g/dL — ABNORMAL LOW (ref 13.0–17.0)
MCH: 36.2 pg — ABNORMAL HIGH (ref 26.0–34.0)
MCH: 36.2 pg — ABNORMAL HIGH (ref 26.0–34.0)
MCH: 35.9 pg — ABNORMAL HIGH (ref 26.0–34.0)
MCH: 35.9 pg — ABNORMAL HIGH (ref 26.0–34.0)
MCH: 36.8 pg — ABNORMAL HIGH (ref 26.0–34.0)
MCHC: 35.6 g/dL (ref 30.0–36.0)
MCHC: 36.4 g/dL — ABNORMAL HIGH (ref 30.0–36.0)
MCHC: 35.7 g/dL (ref 30.0–36.0)
MCHC: 35.7 g/dL (ref 30.0–36.0)
MCHC: 36.1 g/dL — ABNORMAL HIGH (ref 30.0–36.0)
MCV: 101.7 fL — ABNORMAL HIGH (ref 80.0–100.0)
MCV: 99.7 fL (ref 80.0–100.0)
MCV: 100.3 fL — ABNORMAL HIGH (ref 80.0–100.0)
MCV: 100.7 fL — ABNORMAL HIGH (ref 80.0–100.0)
MCV: 101.8 fL — ABNORMAL HIGH (ref 80.0–100.0)
Platelets: 105 10*3/uL — ABNORMAL LOW (ref 150–400)
Platelets: 100 10*3/uL — ABNORMAL LOW (ref 150–400)
Platelets: 102 K/uL — ABNORMAL LOW (ref 150–400)
Platelets: 96 10*3/uL — ABNORMAL LOW (ref 150–400)
Platelets: 93 10*3/uL — ABNORMAL LOW (ref 150–400)
RBC: 3.01 MIL/uL — ABNORMAL LOW (ref 4.22–5.81)
RBC: 2.98 MIL/uL — ABNORMAL LOW (ref 4.22–5.81)
RBC: 2.9 MIL/uL — ABNORMAL LOW (ref 4.22–5.81)
RBC: 2.84 MIL/uL — ABNORMAL LOW (ref 4.22–5.81)
RBC: 2.8 MIL/uL — ABNORMAL LOW (ref 4.22–5.81)
RDW: 18.3 % — ABNORMAL HIGH (ref 11.5–15.5)
RDW: 18.5 % — ABNORMAL HIGH (ref 11.5–15.5)
RDW: 18.1 % — ABNORMAL HIGH (ref 11.5–15.5)
RDW: 18.3 % — ABNORMAL HIGH (ref 11.5–15.5)
RDW: 17.9 % — ABNORMAL HIGH (ref 11.5–15.5)
WBC: 9.6 10*3/uL (ref 4.0–10.5)
WBC: 11 10*3/uL — ABNORMAL HIGH (ref 4.0–10.5)
WBC: 11.1 K/uL — ABNORMAL HIGH (ref 4.0–10.5)
WBC: 9.2 10*3/uL (ref 4.0–10.5)
WBC: 9.7 10*3/uL (ref 4.0–10.5)
nRBC: 0 % (ref 0.0–0.2)
nRBC: 0 % (ref 0.0–0.2)
nRBC: 0 % (ref 0.0–0.2)
nRBC: 0 % (ref 0.0–0.2)
nRBC: 0 % (ref 0.0–0.2)

## 2023-01-23 LAB — COMPREHENSIVE METABOLIC PANEL
ALT: 26 U/L (ref 0–44)
AST: 69 U/L — ABNORMAL HIGH (ref 15–41)
Albumin: 2.3 g/dL — ABNORMAL LOW (ref 3.5–5.0)
Alkaline Phosphatase: 147 U/L — ABNORMAL HIGH (ref 38–126)
Anion gap: 10 (ref 5–15)
BUN: 10 mg/dL (ref 6–20)
CO2: 26 mmol/L (ref 22–32)
Calcium: 8.7 mg/dL — ABNORMAL LOW (ref 8.9–10.3)
Chloride: 98 mmol/L (ref 98–111)
Creatinine, Ser: 0.9 mg/dL (ref 0.61–1.24)
GFR, Estimated: >60 mL/min (ref >60)
Glucose, Bld: 104 mg/dL — ABNORMAL HIGH (ref 70–99)
Potassium: 3.8 mmol/L (ref 3.5–5.1)
Sodium: 134 mmol/L — ABNORMAL LOW (ref 135–145)
Total Bilirubin: 6.6 mg/dL — ABNORMAL HIGH (ref 0.3–1.2)
Total Protein: 6.2 g/dL — ABNORMAL LOW (ref 6.5–8.1)

## 2023-01-23 LAB — VITAMIN B12
Vitamin B-12: 3144 pg/mL — ABNORMAL HIGH (ref 180–914)
Vitamin B-12: 3080 pg/mL — ABNORMAL HIGH (ref 180–914)

## 2023-01-23 LAB — CREATININE, URINE, RANDOM: Creatinine, Urine: 121 mg/dL

## 2023-01-23 LAB — FOLATE: Folate: 14.4 ng/mL (ref >5.9)

## 2023-01-23 LAB — IRON AND TIBC
Iron: 144 ug/dL (ref 45–182)
Saturation Ratios: 111 % — ABNORMAL HIGH (ref 17.9–39.5)
TIBC: 130 ug/dL — ABNORMAL LOW (ref 250–450)

## 2023-01-23 LAB — PREALBUMIN: Prealbumin: <5 mg/dL — ABNORMAL LOW (ref 18–38)

## 2023-01-23 LAB — TSH
TSH: 1.369 u[IU]/mL (ref 0.350–4.500)
TSH: 2.111 u[IU]/mL (ref 0.350–4.500)

## 2023-01-23 LAB — RETICULOCYTES
Immature Retic Fract: 7.7 % (ref 2.3–15.9)
RBC.: 2.97 MIL/uL — ABNORMAL LOW (ref 4.22–5.81)
Retic Count, Absolute: 84.3 10*3/uL (ref 19.0–186.0)
Retic Ct Pct: 2.8 % (ref 0.4–3.1)

## 2023-01-23 LAB — CK: Total CK: 81 U/L (ref 49–397)

## 2023-01-23 LAB — MAGNESIUM
Magnesium: 1.6 mg/dL — ABNORMAL LOW (ref 1.7–2.4)
Magnesium: 1.8 mg/dL (ref 1.7–2.4)

## 2023-01-23 LAB — PHOSPHORUS
Phosphorus: 3.3 mg/dL (ref 2.5–4.6)
Phosphorus: 4 mg/dL (ref 2.5–4.6)

## 2023-01-23 LAB — SODIUM, URINE, RANDOM: Sodium, Ur: 48 mmol/L

## 2023-01-23 LAB — ETHANOL: Alcohol, Ethyl (B): <10 mg/dL (ref <10)

## 2023-01-23 LAB — OSMOLALITY, URINE: Osmolality, Ur: 458 mOsm/kg (ref 300–900)

## 2023-01-23 LAB — OSMOLALITY: Osmolality: 288 mosm/kg (ref 275–295)

## 2023-01-23 LAB — FERRITIN: Ferritin: 838 ng/mL — ABNORMAL HIGH (ref 24–336)

## 2023-01-23 MED ORDER — FOLIC ACID 1 MG PO TABS
1.0000 mg | ORAL_TABLET | Freq: Every day | ORAL | Status: DC
  Administered 2023-01-23 – 2023-03-02 (×38): 1 mg via ORAL
  Filled 2023-01-23 (×38): qty 1

## 2023-01-23 MED ORDER — ACETAMINOPHEN 325 MG PO TABS: 650.0000 mg | ORAL_TABLET | Freq: Four times a day (QID) | ORAL | Status: DC | PRN

## 2023-01-23 MED ORDER — HYDROCODONE-ACETAMINOPHEN 5-325 MG PO TABS
1.0000 | ORAL_TABLET | ORAL | Status: DC | PRN
  Administered 2023-01-23 – 2023-01-25 (×6): 2 via ORAL
  Filled 2023-01-23 (×6): qty 2

## 2023-01-23 MED ORDER — VITAMIN K1 10 MG/ML IJ SOLN
5.0000 mg | Freq: Once | INTRAMUSCULAR | Status: AC
  Administered 2023-01-23: 5 mg via SUBCUTANEOUS
  Filled 2023-01-23: qty 0.5

## 2023-01-23 MED ORDER — MORPHINE SULFATE (PF) 2 MG/ML IV SOLN
2.0000 mg | INTRAVENOUS | Status: DC | PRN
  Administered 2023-01-23 – 2023-02-21 (×53): 2 mg via INTRAVENOUS
  Filled 2023-01-23 (×55): qty 1

## 2023-01-23 MED ORDER — THIAMINE MONONITRATE 100 MG PO TABS
100.0000 mg | ORAL_TABLET | Freq: Every day | ORAL | Status: DC
  Administered 2023-01-23 – 2023-03-01 (×37): 100 mg via ORAL
  Filled 2023-01-23 (×39): qty 1

## 2023-01-23 MED ORDER — SODIUM CHLORIDE 0.9% FLUSH
3.0000 mL | Freq: Two times a day (BID) | INTRAVENOUS | Status: DC
  Administered 2023-01-23 – 2023-03-02 (×60): 3 mL via INTRAVENOUS

## 2023-01-23 MED ORDER — FUROSEMIDE 40 MG PO TABS
40.0000 mg | ORAL_TABLET | Freq: Every day | ORAL | Status: DC
  Administered 2023-01-23 – 2023-01-29 (×7): 40 mg via ORAL
  Filled 2023-01-23 (×8): qty 1

## 2023-01-23 MED ORDER — SODIUM CHLORIDE 0.9% FLUSH: 3.0000 mL | INTRAVENOUS | Status: DC | PRN

## 2023-01-23 MED ORDER — IOHEXOL 300 MG/ML  SOLN
75.0000 mL | Freq: Once | INTRAMUSCULAR | Status: AC | PRN
  Administered 2023-01-23: 75 mL via INTRAVENOUS

## 2023-01-23 MED ORDER — ACETAMINOPHEN 650 MG RE SUPP: 650.0000 mg | Freq: Four times a day (QID) | RECTAL | Status: DC | PRN

## 2023-01-23 MED ORDER — RISPERIDONE 1 MG PO TBDP: 2.0000 mg | ORAL_TABLET | Freq: Three times a day (TID) | ORAL | Status: DC | PRN

## 2023-01-23 MED ORDER — SPIRONOLACTONE 25 MG PO TABS
50.0000 mg | ORAL_TABLET | Freq: Every day | ORAL | Status: DC
  Administered 2023-01-23 – 2023-01-29 (×7): 50 mg via ORAL
  Filled 2023-01-23 (×8): qty 2

## 2023-01-23 MED ORDER — LORAZEPAM 1 MG PO TABS
1.0000 mg | ORAL_TABLET | ORAL | Status: AC | PRN
  Administered 2023-01-24: 1 mg via ORAL
  Filled 2023-01-23: qty 1

## 2023-01-23 MED ORDER — SODIUM CHLORIDE 0.9 % IV SOLN: 250.0000 mL | INTRAVENOUS | Status: DC | PRN

## 2023-01-23 MED ORDER — ZIPRASIDONE MESYLATE 20 MG IM SOLR: 20.0000 mg | INTRAMUSCULAR | Status: DC | PRN

## 2023-01-23 NOTE — ED Notes (Signed)
Patient transported to CT 

## 2023-01-23 NOTE — Consult Note (Signed)
[facility] Consult   Reason for Consult:   Referring Physician:   Patient Identification: Nathaniel Warren MRN:  [id number] Principal Diagnosis: Spontaneous hematoma of lower leg Diagnosis:  Principal Problem:   Spontaneous hematoma of lower leg Active Problems:   Hyponatremia   Tobacco abuse   Alcoholic cirrhosis of liver with ascites ([place])   Suicidal ideations   Total Time spent with patient: 1 hour  Subjective:  Nathaniel Warren is a 57 year old male with right hand injury, thigh hematoma.   . " I told them I was going to kill myself." He is currently unemployed due to damaged nerves in leg. He is living with a friend due to physical limitations " I can't help pay his bills. I applied for food stamps and got them. "  Patient seen, chart reviewed and case discussed with Dr. Leverne Humbles for this face-to-face psychiatric consultation and evaluation of behavior and suicidal ideation. Patient appeared lying on his bed, very irritable during my evaluation. Patient reports that he has been having a lot of disagreements with staff that result in him getting agitated. He reports that he is staying with a friend, who he can not help pay bills because he is unemployed and not able to receive assistance. He ruminates about being an [place] citizen without support, and non American citizens receive thousands of dollars. He identifies additional stressors that included lack of transportation which is a barrier for him to get back and forth. He denies any behavioral disturbances and agitation issues while in the hospital. Chart review seems to be consistent with his reports.  He adamantly endorses being suicidal and he is very irritable, labile, and verbally aggressive at times.  Patient is loud and boisterous.  He denies any homicidal ideations, violent tendencies, aggressive, and or combative behavior during the hospitalization.  He denies any current and or pending legal charges.  He does  admit to smoking marijuana for management of pain, this is chronic and has been ongoing for quite time.  Patient reported he has been cooperative and taking his medication, however does not want to resume them at this time because he can't afford them.      Patient was seen today in the medical floor while waiting for  medical clearance for hematomas.  He remains suicidal and repeatedly stated "Nothing you can do will help. No one wants to help. Im tired of fighting and trying. The doctors dont care, I dont have any money to do anything."  Patient made no eye contact with provider.  He had his back towards provider as he laid in the bed.  He answered that he feels hopeless, desperate, helpless, guilty, and worthless. He identifies manic symptoms to include impulsivity, easily agitated, pressured speech, mood swings and anger.   He believes that he is going to end of killing himself soon.  Patient report two time suicide attempt by alcohol poising and by attempting to jump off the bridge. His second attempt was aborted by passerby; however chart review shows patient reported 4 suicide attempts.  He has not been on any Psychotropic medications in the past 6 months, citing he can't afford them. He is recently seeing Trinna Post at [facility], last appoint in August. He was also receiving therapy at [facility], last visit in December 2023, in which he identified depressive symptoms. He reports non-compliance with his medications secondary to cost and access. Patient reported weight loss, poor sleep and appetite.  He is worried about possible  cirrhosis and GI and has had work up done,.  Patient uses THC often for pain relief, however states he can barely afford it.  UDS is positive for.  Urine drug screen and THC and opiates, he likely received pain medication while in the ED (prior to specimen being provided). . Patient meets criteria for inpatient hospitalization.    Nathaniel Warren is a 57 y.o. male patient admitted with .hx  for polysubstance abuse, ETOH use, nerve compression L5-S1, MDD, suicide attempt x 4-who endorses suicidal ideations, and intent.  He is guarded and does not disclose the plan.  He is triggered by his chronic nerve pain, financial losses and medical comorbidities. Patient is disgruntled and frustrated throughout the majority of the evaluation. He ruminates and perseverates about multiple psychosocial stressors with a large contribution being finances, lack of access, and underlying mood disorder.  Patient with feelings of hopelessness and despair, impaired judgement and lack of insight presents an acutely high safety risk and continues to be appropriate for inpatient psychiatric admission where he can be monitored for safety and stabilized on medications. He meets criteria for IVC which will be upheld at this time.   HPI:  Nathaniel Warren hit his right hand on a cabinet by accident 2-3d ago. At first he had a small knot on the top of his hand. He also developed a thigh hematoma spontaneously. While in the ED his hand ballooned up about 3x as big and hand surgery was consulted. He is RHD.   Past Psychiatric History: Depression, Anxiety, Sleep disturbances. No longer drinks alcohol since being diagnosed with Cirrhosis.   Medications: Paxil, Trazodone, Gabapentin, and Seroquel.   History of multiple suicide attempts. See above.   Risk to Self:   Yes Risk to Others:   Yes Prior Inpatient Therapy:   Yes x 2  Prior Outpatient Therapy:   [place]  Past Medical History:  Past Medical History:  Diagnosis Date   Depression    Hernia, abdominal    Liver failure ([place])    Seizures ([place])    Stroke [facility])     Past Surgical History:  Procedure Laterality Date   ANKLE SURGERY     ANKLE SURGERY Right    HERNIA REPAIR     SPLENECTOMY     Family History:  Family History  Problem Relation Age of Onset   COPD Mother    Diabetes Father    Stroke Maternal Grandmother    Alzheimer's disease Maternal Grandfather     Cancer Paternal Grandmother    Stomach cancer Paternal Grandfather    Hypertension Other    Colon cancer Neg Hx    Esophageal cancer Neg Hx    Colon polyps Neg Hx    Inflammatory bowel disease Neg Hx    Liver disease Neg Hx    Pancreatic cancer Neg Hx    Rectal cancer Neg Hx    Family Psychiatric  History: Mother - Depression and anxiety. Patient states that his mother has also attempted suicide in the past.  Social History:  Social History   Substance and Sexual Activity  Alcohol Use Not Currently     Social History   Substance and Sexual Activity  Drug Use Yes   Types: Marijuana    Social History   Socioeconomic History   Marital status: Single    Spouse name: Not on file   Number of children: 2   Years of education: 14   Highest education level: Not on file  Occupational History  Occupation: Nature conservation officer  Tobacco Use   Smoking status: Every Day    Packs/day: 1.00    Years: 30.00    Total pack years: 30.00    Types: Cigarettes   Smokeless tobacco: Former  Scientific laboratory technician Use: Never used  Substance and Sexual Activity   Alcohol use: Not Currently   Drug use: Yes    Types: Marijuana   Sexual activity: Yes    Comment:  wife had hysterectomy  Other Topics Concern   Not on file  Social History Narrative   ** Merged History Encounter **    Right handed   Unable to work   Lives with roommate and cousin   Drinks caffeine   One floor house   Social Determinants of Health   Financial Resource Strain: High Risk (07/15/2022)   Overall Financial Resource Strain (CARDIA)    Difficulty of Paying Living Expenses: Very hard  Food Insecurity: Food Insecurity Present (01/23/2023)   Hunger Vital Sign    Worried About Running Out of Food in the Last Year: Sometimes true    Ran Out of Food in the Last Year: Sometimes true  Transportation Needs: Unmet Transportation Needs (01/23/2023)   PRAPARE - Transportation    Lack of Transportation (Medical): Yes    Lack  of Transportation (Non-Medical): Yes  Physical Activity: Inactive (07/15/2022)   Exercise Vital Sign    Days of Exercise per Week: 0 days    Minutes of Exercise per Session: 0 min  Stress: Stress Concern Present (07/15/2022)   [place] of Stress : Very much  Social Connections: Socially Isolated (07/15/2022)   Social Connection and Isolation Panel [NHANES]    Frequency of Communication with Friends and Family: More than three times a week    Frequency of Social Gatherings with Friends and Family: Once a week    Attends Religious Services: Never    Marine scientist or Organizations: No    Attends Archivist Meetings: Never    Marital Status: Divorced   Additional Social History:  Patient is not currently working and is attempting to get on short-term disability. Patient has housing and is currently living with 2 roommates. Patient denies having transportation of his own but does utilize public transport. In regards to social support, patient states that he has 4 friends but he often keeps people at arms length.   Allergies:  No Known Allergies  Labs:  Results for orders placed or performed during the hospital encounter of 01/22/23 (from the past 48 hour(s))  TSH     Status: None   Collection Time: 01/22/23 12:34 AM  Result Value Ref Range   TSH 1.369 0.350 - 4.500 uIU/mL    Comment: Performed by a 3rd Generation assay with a functional sensitivity of <=0.01 uIU/mL. Performed at [facility], [place] [address] [place], [place] [address]   Vitamin B12     Status: Abnormal   Collection Time: 01/22/23 12:34 AM  Result Value Ref Range   Vitamin B-12 3,144 (H) 180 - 914 pg/mL    Comment: RESULT CONFIRMED BY MANUAL DILUTION (NOTE) This assay is not validated for testing neonatal or myeloproliferative syndrome specimens for Vitamin B12 levels. Performed at [facility], [place] [address], [place], [place] [address]   Folate     Status: None   Collection Time: 01/22/23 12:34 AM  Result Value Ref Range  Folate 14.4 >5.9 ng/mL    Comment: Performed at [facility], [place] [address] [place], [place] [address]  Iron and TIBC     Status: Abnormal   Collection Time: 01/22/23 12:34 AM  Result Value Ref Range   Iron 144 45 - 182 ug/dL   TIBC 130 (L) 250 - 450 ug/dL   Saturation Ratios 111 (H) 17.9 - 39.5 %   UIBC NOT CALCULATED ug/dL    Comment: Performed at [facility], [name] [address], [place], [place] [address]  Ferritin     Status: Abnormal   Collection Time: 01/22/23 12:34 AM  Result Value Ref Range   Ferritin 838 (H) 24 - 336 ng/mL    Comment: ICTERUS AT THIS LEVEL MAY AFFECT RESULT Performed at [facility] [address] [place], [place] [address]   Reticulocytes     Status: Abnormal   Collection Time: 01/22/23 12:34 AM  Result Value Ref Range   Retic Ct Pct 2.8 0.4 - 3.1 %   RBC. 2.97 (L) 4.22 - 5.81 MIL/uL   Retic Count, Absolute 84.3 19.0 - 186.0 K/uL   Immature Retic Fract 7.7 2.3 - 15.9 %    Comment: Performed at [facility], [place] [address] [place], [place] [address]  Osmolality     Status: None   Collection Time: 01/22/23 12:46 AM  Result Value Ref Range   Osmolality 288 275 - 295 mOsm/kg    Comment: Performed at [place] [address], [place], [place] [address]  CK     Status: None   Collection Time: 01/22/23 12:46 AM  Result Value Ref Range   Total CK 81 49 - 397 U/L    Comment: Performed at [facility], [place] [address], [place], [place] [address]  Magnesium     Status: Abnormal   Collection Time: 01/22/23 12:46 AM  Result Value Ref Range   Magnesium 1.6 (L) 1.7 - 2.4 mg/dL    Comment: Performed at [facility], [place] [address], [place], [place] [address]  Phosphorus     Status: None    Collection Time: 01/22/23 12:46 AM  Result Value Ref Range   Phosphorus 3.3 2.5 - 4.6 mg/dL    Comment: Performed at [facility], [place] [address] [place], [place] [address]  Urinalysis, Complete w Microscopic -Urine, Clean Catch     Status: Abnormal   Collection Time: 01/22/23 12:52 AM  Result Value Ref Range   Color, Urine AMBER (A) YELLOW    Comment: BIOCHEMICALS MAY BE AFFECTED BY COLOR   APPearance CLEAR CLEAR   Specific Gravity, Urine 1.044 (H) 1.005 - 1.030   pH 5.0 5.0 - 8.0   Glucose, UA NEGATIVE NEGATIVE mg/dL   Hgb urine dipstick NEGATIVE NEGATIVE   Bilirubin Urine NEGATIVE NEGATIVE   Ketones, ur NEGATIVE NEGATIVE mg/dL   Protein, ur NEGATIVE NEGATIVE mg/dL   Nitrite NEGATIVE NEGATIVE   Leukocytes,Ua NEGATIVE NEGATIVE   RBC / HPF 0-5 0 - 5 RBC/hpf   WBC, UA 0-5 0 - 5 WBC/hpf   Bacteria, UA NONE SEEN NONE SEEN   Squamous Epithelial / HPF 0-5 0 - 5 /HPF   Mucus PRESENT    Hyaline Casts, UA PRESENT     Comment: Performed at [facility], [place] [address], [place], [place] [address]  Osmolality, urine     Status: None   Collection Time: 01/22/23 12:53 AM  Result  Value Ref Range   Osmolality, Ur 458 300 - 900 mOsm/kg    Comment: Performed at [place] [address], [place], [place] [address]  Sodium, urine, random     Status: None   Collection Time: 01/22/23 12:53 AM  Result Value Ref Range   Sodium, Ur 48 mmol/L    Comment: Performed at [facility], [place] [address] [place], [place] [address]  Creatinine, urine, random     Status: None   Collection Time: 01/22/23 12:53 AM  Result Value Ref Range   Creatinine, Urine 121 mg/dL    Comment: Performed at [facility], [place] [address] [place], [place] [address]  CBC with Differential     Status: Abnormal   Collection Time: 01/22/23  7:28 PM  Result Value Ref Range   WBC 8.8 4.0 - 10.5 K/uL   RBC 3.14 (L) 4.22 - 5.81 MIL/uL   Hemoglobin  11.3 (L) 13.0 - 17.0 g/dL   HCT 31.8 (L) 39.0 - 52.0 %   MCV 101.3 (H) 80.0 - 100.0 fL   MCH 36.0 (H) 26.0 - 34.0 pg   MCHC 35.5 30.0 - 36.0 g/dL   RDW 18.5 (H) 11.5 - 15.5 %   Platelets 108 (L) 150 - 400 K/uL    Comment: SPECIMEN CHECKED FOR CLOTS CONSISTENT WITH PREVIOUS RESULT REPEATED TO VERIFY    nRBC 0.3 (H) 0.0 - 0.2 %   Neutrophils Relative % 56 %   Neutro Abs 5.0 1.7 - 7.7 K/uL   Lymphocytes Relative 25 %   Lymphs Abs 2.2 0.7 - 4.0 K/uL   Monocytes Relative 14 %   Monocytes Absolute 1.3 (H) 0.1 - 1.0 K/uL   Eosinophils Relative 3 %   Eosinophils Absolute 0.3 0.0 - 0.5 K/uL   Basophils Relative 1 %   Basophils Absolute 0.1 0.0 - 0.1 K/uL   Immature Granulocytes 1 %   Abs Immature Granulocytes 0.04 0.00 - 0.07 K/uL    Comment: Performed at [facility], [place] [address], [place], [place] [address]  Comprehensive metabolic panel     Status: Abnormal   Collection Time: 01/22/23  7:28 PM  Result Value Ref Range   Sodium 131 (L) 135 - 145 mmol/L   Potassium 3.8 3.5 - 5.1 mmol/L   Chloride 97 (L) 98 - 111 mmol/L   CO2 25 22 - 32 mmol/L   Glucose, Bld 97 70 - 99 mg/dL    Comment: Glucose reference range applies only to samples taken after fasting for at least 8 hours.   BUN 9 6 - 20 mg/dL   Creatinine, Ser 0.89 0.61 - 1.24 mg/dL   Calcium 8.4 (L) 8.9 - 10.3 mg/dL   Total Protein 6.9 6.5 - 8.1 g/dL   Albumin 2.6 (L) 3.5 - 5.0 g/dL   AST 85 (H) 15 - 41 U/L   ALT 30 0 - 44 U/L   Alkaline Phosphatase 170 (H) 38 - 126 U/L   Total Bilirubin 6.8 (H) 0.3 - 1.2 mg/dL   GFR, Estimated >60 >60 mL/min    Comment: (NOTE) Calculated using the CKD-EPI Creatinine Equation (2021)    Anion gap 9 5 - 15    Comment: Performed at [facility], [place] [address], [place], [place] [address]  Brain natriuretic peptide     Status: Abnormal   Collection Time: 01/22/23  7:28 PM  Result Value Ref Range   B Natriuretic Peptide 161.2 (H) 0.0 -  100.0 pg/mL     Comment: Performed at [facility], [place] [address], [place], [place] [address]  Protime-INR     Status: Abnormal   Collection Time: 01/22/23  7:28 PM  Result Value Ref Range   Prothrombin Time 22.1 (H) 11.4 - 15.2 seconds   INR 2.0 (H) 0.8 - 1.2    Comment: (NOTE) INR goal varies based on device and disease states. Performed at [facility], [place] [address], [place], [place] [address]   Troponin I (High Sensitivity)     Status: None   Collection Time: 01/22/23  7:28 PM  Result Value Ref Range   Troponin I (High Sensitivity) 6 <18 ng/L    Comment: (NOTE) Elevated high sensitivity troponin I (hsTnI) values and significant  changes across serial measurements may suggest ACS but many other  chronic and acute conditions are known to elevate hsTnI results.  Refer to the "Links" section for chest pain algorithms and additional  guidance. Performed at [facility], [place] [address] [place], [place] [address]   Troponin I (High Sensitivity)     Status: None   Collection Time: 01/22/23  8:50 PM  Result Value Ref Range   Troponin I (High Sensitivity) 6 <18 ng/L    Comment: (NOTE) Elevated high sensitivity troponin I (hsTnI) values and significant  changes across serial measurements may suggest ACS but many other  chronic and acute conditions are known to elevate hsTnI results.  Refer to the "Links" section for chest pain algorithms and additional  guidance. Performed at [facility], [place] [address] [place], [place] [address]   Type and screen     Status: None   Collection Time: 01/22/23 10:51 PM  Result Value Ref Range   ABO/RH(D) O POS    Antibody Screen NEG    Sample Expiration      01/25/2023,2359 Performed at [facility], [place] [address] [place], [place] [address]   CBC     Status: Abnormal   Collection Time: 01/23/23 12:34 AM  Result Value Ref Range   WBC 9.6 4.0 - 10.5 K/uL    RBC 3.01 (L) 4.22 - 5.81 MIL/uL   Hemoglobin 10.9 (L) 13.0 - 17.0 g/dL   HCT 30.6 (L) 39.0 - 52.0 %   MCV 101.7 (H) 80.0 - 100.0 fL   MCH 36.2 (H) 26.0 - 34.0 pg   MCHC 35.6 30.0 - 36.0 g/dL   RDW 18.3 (H) 11.5 - 15.5 %   Platelets 105 (L) 150 - 400 K/uL   nRBC 0.0 0.0 - 0.2 %    Comment: Performed at [facility], [place] [address], [place], [place] [address]  Rapid urine drug screen (hospital performed)     Status: Abnormal   Collection Time: 01/23/23 12:52 AM  Result Value Ref Range   Opiates POSITIVE (A) NONE DETECTED   Cocaine NONE DETECTED NONE DETECTED   Benzodiazepines NONE DETECTED NONE DETECTED   Amphetamines NONE DETECTED NONE DETECTED   Tetrahydrocannabinol POSITIVE (A) NONE DETECTED   Barbiturates NONE DETECTED NONE DETECTED    Comment: (NOTE) DRUG SCREEN FOR MEDICAL PURPOSES ONLY.  IF CONFIRMATION IS NEEDED FOR ANY PURPOSE, NOTIFY LAB WITHIN 5 DAYS.  LOWEST DETECTABLE LIMITS FOR URINE DRUG SCREEN Drug Class                     Cutoff (ng/mL) Amphetamine and metabolites    1000 Barbiturate and metabolites  200 Benzodiazepine                 200 Opiates and metabolites        300 Cocaine and metabolites        300 THC                            50 Performed at [facility] [address], [place], [place] [address]   Prealbumin     Status: Abnormal   Collection Time: 01/23/23  4:23 AM  Result Value Ref Range   Prealbumin <5 (L) 18 - 38 mg/dL    Comment: Performed at [place] [address] [place], [place] [address]  CBC     Status: Abnormal   Collection Time: 01/23/23  4:23 AM  Result Value Ref Range   WBC 11.0 (H) 4.0 - 10.5 K/uL   RBC 2.98 (L) 4.22 - 5.81 MIL/uL   Hemoglobin 10.8 (L) 13.0 - 17.0 g/dL   HCT 29.7 (L) 39.0 - 52.0 %   MCV 99.7 80.0 - 100.0 fL   MCH 36.2 (H) 26.0 - 34.0 pg   MCHC 36.4 (H) 30.0 - 36.0 g/dL   RDW 18.5 (H) 11.5 - 15.5 %   Platelets 100 (L) 150 - 400 K/uL   nRBC 0.0 0.0 -  0.2 %    Comment: Performed at [facility], [place] [address], [place], [place] [address]  Comprehensive metabolic panel     Status: Abnormal   Collection Time: 01/23/23  4:55 AM  Result Value Ref Range   Sodium 134 (L) 135 - 145 mmol/L   Potassium 3.8 3.5 - 5.1 mmol/L   Chloride 98 98 - 111 mmol/L   CO2 26 22 - 32 mmol/L   Glucose, Bld 104 (H) 70 - 99 mg/dL    Comment: Glucose reference range applies only to samples taken after fasting for at least 8 hours.   BUN 10 6 - 20 mg/dL   Creatinine, Ser 0.90 0.61 - 1.24 mg/dL   Calcium 8.7 (L) 8.9 - 10.3 mg/dL   Total Protein 6.2 (L) 6.5 - 8.1 g/dL   Albumin 2.3 (L) 3.5 - 5.0 g/dL   AST 69 (H) 15 - 41 U/L   ALT 26 0 - 44 U/L   Alkaline Phosphatase 147 (H) 38 - 126 U/L   Total Bilirubin 6.6 (H) 0.3 - 1.2 mg/dL   GFR, Estimated >60 >60 mL/min    Comment: (NOTE) Calculated using the CKD-EPI Creatinine Equation (2021)    Anion gap 10 5 - 15    Comment: Performed at [facility], [place] [address], [place], [place] [address]  Magnesium     Status: None   Collection Time: 01/23/23  4:55 AM  Result Value Ref Range   Magnesium 1.8 1.7 - 2.4 mg/dL    Comment: Performed at [facility], [place] [address], [place], [place] [address]  Phosphorus     Status: None   Collection Time: 01/23/23  4:55 AM  Result Value Ref Range   Phosphorus 4.0 2.5 - 4.6 mg/dL    Comment: Performed at [facility], [place] [address], [place], [place] [address]  CBC     Status: Abnormal   Collection Time: 01/23/23  7:50 AM  Result Value Ref Range   WBC 11.1 (H) 4.0 - 10.5 K/uL   RBC 2.90 (L) 4.22 - 5.81  MIL/uL   Hemoglobin 10.4 (L) 13.0 - 17.0 g/dL   HCT 29.1 (L) 39.0 - 52.0 %   MCV 100.3 (H) 80.0 - 100.0 fL   MCH 35.9 (H) 26.0 - 34.0 pg   MCHC 35.7 30.0 - 36.0 g/dL   RDW 18.1 (H) 11.5 - 15.5 %   Platelets 102 (L) 150 - 400 K/uL   nRBC 0.0 0.0 - 0.2 %    Comment: Performed at [facility], [place] [address] [place], [place] [address]  Ethanol     Status: None   Collection Time: 01/23/23 10:15 AM  Result Value Ref Range   Alcohol, Ethyl (B) <10 <10 mg/dL    Comment: (NOTE) Lowest detectable limit for serum alcohol is 10 mg/dL.  For medical purposes only. Performed at [facility], [place] [address], [place], [place] [address]   CBC     Status: Abnormal   Collection Time: 01/23/23  1:43 PM  Result Value Ref Range   WBC 9.2 4.0 - 10.5 K/uL   RBC 2.84 (L) 4.22 - 5.81 MIL/uL   Hemoglobin 10.2 (L) 13.0 - 17.0 g/dL   HCT 28.6 (L) 39.0 - 52.0 %   MCV 100.7 (H) 80.0 - 100.0 fL   MCH 35.9 (H) 26.0 - 34.0 pg   MCHC 35.7 30.0 - 36.0 g/dL   RDW 18.3 (H) 11.5 - 15.5 %   Platelets 96 (L) 150 - 400 K/uL    Comment: SPECIMEN CHECKED FOR CLOTS Immature Platelet Fraction may be clinically indicated, consider ordering this additional test [id number] REPEATED TO VERIFY PLATELET COUNT CONFIRMED BY SMEAR    nRBC 0.0 0.0 - 0.2 %    Comment: Performed at [facility], [place] [address] [place], [place] [address]    Current Facility-Administered Medications  Medication Dose Route Frequency Provider Last Rate Last Admin   0.9 %  sodium chloride infusion  250 mL Intravenous PRN Toy Baker, MD       acetaminophen (TYLENOL) tablet 650 mg  650 mg Oral Q6H PRN Toy Baker, MD       Or   acetaminophen (TYLENOL) suppository 650 mg  650 mg Rectal Q6H PRN Toy Baker, MD       folic acid (FOLVITE) tablet 1 mg  1 mg Oral Daily Doutova, Anastassia, MD   1 mg at 01/23/23 0911   furosemide (LASIX) tablet 40 mg  40 mg Oral Daily Toy Baker, MD   40 mg at 01/23/23 [id number]   HYDROcodone-acetaminophen (NORCO/VICODIN) 5-325 MG per tablet 1-2 tablet  1-2 tablet Oral Q4H PRN Toy Baker, MD   2 tablet at 01/23/23 1342   risperiDONE (RISPERDAL M-TABS) disintegrating tablet 2 mg  2 mg Oral Q8H PRN Starkes-Perry, Gayland Curry, FNP       And   LORazepam (ATIVAN) tablet 1 mg  1 mg Oral PRN Starkes-Perry, Gayland Curry, FNP       And   ziprasidone (GEODON) injection 20 mg  20 mg Intramuscular PRN Starkes-Perry, Gayland Curry, FNP       morphine (PF) 2 MG/ML injection 2 mg  2 mg Intravenous Q3H PRN Doutova, Anastassia, MD   2 mg at 01/23/23 1633   phytonadione (VITAMIN K) SQ injection 5 mg  5 mg Subcutaneous Once Nita Sells, MD       sodium chloride flush (NS) 0.9 % injection 3 mL  3 mL Intravenous Q12H Doutova, Anastassia, MD       sodium chloride flush (NS)  0.9 % injection 3 mL  3 mL Intravenous PRN Doutova, Anastassia, MD       spironolactone (ALDACTONE) tablet 50 mg  50 mg Oral Daily Doutova, Anastassia, MD   50 mg at 01/23/23 [id number]   thiamine (VITAMIN B1) tablet 100 mg  100 mg Oral Daily Toy Baker, MD   100 mg at 01/23/23 0911    Musculoskeletal: Strength & Muscle Tone: within normal limits Gait & Station: normal Patient leans: N/A    Psychiatric Specialty Exam:  Presentation  General Appearance: Appropriate for Environment  Eye Contact:None  Speech:Clear and Coherent; Pressured  Speech Volume:Increased  Handedness:Right   Mood and Affect  Mood:Angry; Labile  Affect:Inappropriate; Labile   Thought Process  Thought Processes:Coherent; Linear  Descriptions of Associations:Circumstantial  Orientation:Full (Time, Place and Person)  Thought Content:WDL  History of Schizophrenia/Schizoaffective disorder:No  Duration of Psychotic Symptoms:No data recorded Hallucinations:Hallucinations: None  Ideas of Reference:None  Suicidal Thoughts:Suicidal Thoughts: Yes, Active SI Active Intent and/or Plan: With Intent; With Plan; With Means to [place]; With Access to Means  Homicidal Thoughts:Homicidal Thoughts: No   Sensorium  Memory:Immediate Fair; Recent Good; Remote Fair  Judgment:Poor  Insight:Shallow   Executive Functions  Concentration:Fair  Attention  Span:Fair  [place]   Psychomotor Activity  Psychomotor Activity:Psychomotor Activity: Normal   Assets  Assets:Communication Skills; Desire for Improvement; Physical Health; Leisure Time; Resilience   Sleep  Sleep:Sleep: Fair   Physical Exam: Physical Exam Vitals and nursing note reviewed.  Constitutional:      Appearance: Normal appearance. He is normal weight.  Neurological:     Mental Status: He is alert.  Psychiatric:        Attention and Perception: Attention and perception normal.        Mood and Affect: Affect is labile, blunt and angry.        Speech: Speech is rapid and pressured.        Behavior: Behavior is uncooperative and agitated.        Thought Content: Delusional: does not wish to disclose " I got it all worked out. [employer] content includes suicidal ideation. Thought content includes suicidal plan.        Cognition and Memory: Cognition and memory normal.    ROS Blood pressure (!) 93/55, pulse (!) 53, temperature 98.4 F (36.9 C), temperature source Oral, resp. rate 17, SpO2 100 %. There is no height or weight on file to calculate BMI.  Treatment Plan Summary: Daily contact with patient to assess and evaluate symptoms and progress in treatment, Medication management, and Plan Continue to uphold IVC at this time.  -Will add agitation protocol at this time.  -Continue 1: 1 safety sitter. - Will obtain b12 and tsh.  -Consider TOC consult once medically stable.   Disposition: Recommend psychiatric Inpatient admission when medically cleared.  Suella Broad, FNP 01/23/2023 6:47 PM

## 2023-01-23 NOTE — Consult Note (Signed)
Reason for Consult:Right hand hematoma Referring Physician: Roselyn Bering Time called:1400  Time at bedside: Colon is an 57 y.o. male.  HPI: Creek hit his right hand on a cabinet by accident 2-3d ago. At first he had a small knot on the top of his hand. He also developed a thigh hematoma spontaneously. While in the ED his hand ballooned up about 3x as big and hand surgery was consulted. He is RHD.  Past Medical History:  Diagnosis Date   Depression    Hernia, abdominal    Liver failure ([place])    Seizures ([place])    Stroke [facility])     Past Surgical History:  Procedure Laterality Date   ANKLE SURGERY     ANKLE SURGERY Right    HERNIA REPAIR     SPLENECTOMY      Family History  Problem Relation Age of Onset   COPD Mother    Diabetes Father    Stroke Maternal Grandmother    Alzheimer's disease Maternal Grandfather    Cancer Paternal Grandmother    Stomach cancer Paternal Grandfather    Hypertension Other    Colon cancer Neg Hx    Esophageal cancer Neg Hx    Colon polyps Neg Hx    Inflammatory bowel disease Neg Hx    Liver disease Neg Hx    Pancreatic cancer Neg Hx    Rectal cancer Neg Hx     Social History:  reports that he has been smoking cigarettes. He has a 30.00 pack-year smoking history. He has quit using smokeless tobacco. He reports that he does not currently use alcohol. He reports current drug use. Drug: Marijuana.  Allergies: No Known Allergies  Medications: I have reviewed the patient's current medications.  Results for orders placed or performed during the hospital encounter of 01/22/23 (from the past 48 hour(s))  TSH     Status: None   Collection Time: 01/22/23 12:34 AM  Result Value Ref Range   TSH 1.369 0.350 - 4.500 uIU/mL    Comment: Performed by a 3rd Generation assay with a functional sensitivity of <=0.01 uIU/mL. Performed at [facility], [place] [address] [place], [place] [address]   Vitamin B12     Status:  Abnormal   Collection Time: 01/22/23 12:34 AM  Result Value Ref Range   Vitamin B-12 3,144 (H) 180 - 914 pg/mL    Comment: RESULT CONFIRMED BY MANUAL DILUTION (NOTE) This assay is not validated for testing neonatal or myeloproliferative syndrome specimens for Vitamin B12 levels. Performed at [facility], [place] [address], [place], [place] [address]   Folate     Status: None   Collection Time: 01/22/23 12:34 AM  Result Value Ref Range   Folate 14.4 >5.9 ng/mL    Comment: Performed at [facility], [place] [address], [place], [place] [address]  Iron and TIBC     Status: Abnormal   Collection Time: 01/22/23 12:34 AM  Result Value Ref Range   Iron 144 45 - 182 ug/dL   TIBC 130 (L) 250 - 450 ug/dL   Saturation Ratios 111 (H) 17.9 - 39.5 %   UIBC NOT CALCULATED ug/dL    Comment: Performed at [facility], [place] [address] [place], [place] [address]  Ferritin     Status: Abnormal   Collection Time: 01/22/23 12:34 AM  Result Value Ref Range   Ferritin 838 (H) 24 - 336 ng/mL    Comment:  ICTERUS AT THIS LEVEL MAY AFFECT RESULT Performed at [place] [address] [place], [place] [address]   Reticulocytes     Status: Abnormal   Collection Time: 01/22/23 12:34 AM  Result Value Ref Range   Retic Ct Pct 2.8 0.4 - 3.1 %   RBC. 2.97 (L) 4.22 - 5.81 MIL/uL   Retic Count, Absolute 84.3 19.0 - 186.0 K/uL   Immature Retic Fract 7.7 2.3 - 15.9 %    Comment: Performed at [facility], [place] [address] [place], [place] [address]  Osmolality     Status: None   Collection Time: 01/22/23 12:46 AM  Result Value Ref Range   Osmolality 288 275 - 295 mOsm/kg    Comment: Performed at [place] [address] [place], [place] [address]  CK     Status: None   Collection Time: 01/22/23 12:46 AM  Result Value Ref Range   Total CK 81 49 - 397 U/L    Comment: Performed at [facility], [place] [address] [place], [place] [address]  Magnesium     Status: Abnormal   Collection Time: 01/22/23 12:46 AM  Result Value Ref Range   Magnesium 1.6 (L) 1.7 - 2.4 mg/dL    Comment: Performed at [facility], [place] [address], [place], [place] [address]  Phosphorus     Status: None   Collection Time: 01/22/23 12:46 AM  Result Value Ref Range   Phosphorus 3.3 2.5 - 4.6 mg/dL    Comment: Performed at [facility], [place] [address], [place], [place] [address]  Urinalysis, Complete w Microscopic -Urine, Clean Catch     Status: Abnormal   Collection Time: 01/22/23 12:52 AM  Result Value Ref Range   Color, Urine AMBER (A) YELLOW    Comment: BIOCHEMICALS MAY BE AFFECTED BY COLOR   APPearance CLEAR CLEAR   Specific Gravity, Urine 1.044 (H) 1.005 - 1.030   pH 5.0 5.0 - 8.0   Glucose, UA NEGATIVE NEGATIVE mg/dL   Hgb urine dipstick NEGATIVE NEGATIVE   Bilirubin Urine NEGATIVE NEGATIVE   Ketones, ur NEGATIVE NEGATIVE mg/dL   Protein, ur NEGATIVE NEGATIVE mg/dL   Nitrite NEGATIVE NEGATIVE   Leukocytes,Ua NEGATIVE NEGATIVE   RBC / HPF 0-5 0 - 5 RBC/hpf   WBC, UA 0-5 0 - 5 WBC/hpf   Bacteria, UA NONE SEEN NONE SEEN   Squamous Epithelial / HPF 0-5 0 - 5 /HPF   Mucus PRESENT    Hyaline Casts, UA PRESENT     Comment: Performed at [facility], [place] [address], [place], [place] [address]  Osmolality, urine     Status: None   Collection Time: 01/22/23 12:53 AM  Result Value Ref Range   Osmolality, Ur 458 300 - 900 mOsm/kg    Comment: Performed at [place] [address] [place], [place] [address]  Sodium, urine, random     Status: None   Collection Time: 01/22/23 12:53 AM  Result Value Ref Range   Sodium, Ur 48 mmol/L    Comment: Performed at [facility], [place] [address] [place], [place] [address]  Creatinine, urine, random     Status: None   Collection Time: 01/22/23 12:53 AM  Result Value Ref  Range   Creatinine, Urine 121 mg/dL    Comment: Performed at [facility], [place] [address] [place], [place] [address]  CBC with Differential  Status: Abnormal   Collection Time: 01/22/23  7:28 PM  Result Value Ref Range   WBC 8.8 4.0 - 10.5 K/uL   RBC 3.14 (L) 4.22 - 5.81 MIL/uL   Hemoglobin 11.3 (L) 13.0 - 17.0 g/dL   HCT 31.8 (L) 39.0 - 52.0 %   MCV 101.3 (H) 80.0 - 100.0 fL   MCH 36.0 (H) 26.0 - 34.0 pg   MCHC 35.5 30.0 - 36.0 g/dL   RDW 18.5 (H) 11.5 - 15.5 %   Platelets 108 (L) 150 - 400 K/uL    Comment: SPECIMEN CHECKED FOR CLOTS CONSISTENT WITH PREVIOUS RESULT REPEATED TO VERIFY    nRBC 0.3 (H) 0.0 - 0.2 %   Neutrophils Relative % 56 %   Neutro Abs 5.0 1.7 - 7.7 K/uL   Lymphocytes Relative 25 %   Lymphs Abs 2.2 0.7 - 4.0 K/uL   Monocytes Relative 14 %   Monocytes Absolute 1.3 (H) 0.1 - 1.0 K/uL   Eosinophils Relative 3 %   Eosinophils Absolute 0.3 0.0 - 0.5 K/uL   Basophils Relative 1 %   Basophils Absolute 0.1 0.0 - 0.1 K/uL   Immature Granulocytes 1 %   Abs Immature Granulocytes 0.04 0.00 - 0.07 K/uL    Comment: Performed at [facility], [place] [address], [place], [place] [address]  Comprehensive metabolic panel     Status: Abnormal   Collection Time: 01/22/23  7:28 PM  Result Value Ref Range   Sodium 131 (L) 135 - 145 mmol/L   Potassium 3.8 3.5 - 5.1 mmol/L   Chloride 97 (L) 98 - 111 mmol/L   CO2 25 22 - 32 mmol/L   Glucose, Bld 97 70 - 99 mg/dL    Comment: Glucose reference range applies only to samples taken after fasting for at least 8 hours.   BUN 9 6 - 20 mg/dL   Creatinine, Ser 0.89 0.61 - 1.24 mg/dL   Calcium 8.4 (L) 8.9 - 10.3 mg/dL   Total Protein 6.9 6.5 - 8.1 g/dL   Albumin 2.6 (L) 3.5 - 5.0 g/dL   AST 85 (H) 15 - 41 U/L   ALT 30 0 - 44 U/L   Alkaline Phosphatase 170 (H) 38 - 126 U/L   Total Bilirubin 6.8 (H) 0.3 - 1.2 mg/dL   GFR, Estimated >60 >60 mL/min    Comment: (NOTE) Calculated using the CKD-EPI  Creatinine Equation (2021)    Anion gap 9 5 - 15    Comment: Performed at [facility], [place] [address] [place], [place] [address]  Brain natriuretic peptide     Status: Abnormal   Collection Time: 01/22/23  7:28 PM  Result Value Ref Range   B Natriuretic Peptide 161.2 (H) 0.0 - 100.0 pg/mL    Comment: Performed at [facility], [place] [address], [place], [place] [address]  Protime-INR     Status: Abnormal   Collection Time: 01/22/23  7:28 PM  Result Value Ref Range   Prothrombin Time 22.1 (H) 11.4 - 15.2 seconds   INR 2.0 (H) 0.8 - 1.2    Comment: (NOTE) INR goal varies based on device and disease states. Performed at [facility], [place] [address] [place], [place] [address]   Troponin I (High Sensitivity)     Status: None   Collection Time: 01/22/23  7:28 PM  Result Value Ref Range   Troponin I (High Sensitivity) 6 <18 ng/L    Comment: (NOTE) Elevated high sensitivity  troponin I (hsTnI) values and significant  changes across serial measurements may suggest ACS but many other  chronic and acute conditions are known to elevate hsTnI results.  Refer to the "Links" section for chest pain algorithms and additional  guidance. Performed at [facility], [place] [address], [place], [place] [address]   Troponin I (High Sensitivity)     Status: None   Collection Time: 01/22/23  8:50 PM  Result Value Ref Range   Troponin I (High Sensitivity) 6 <18 ng/L    Comment: (NOTE) Elevated high sensitivity troponin I (hsTnI) values and significant  changes across serial measurements may suggest ACS but many other  chronic and acute conditions are known to elevate hsTnI results.  Refer to the "Links" section for chest pain algorithms and additional  guidance. Performed at [facility], [place] [address], [place], [place] [address]   Type and screen     Status: None   Collection Time: 01/22/23 10:51 PM   Result Value Ref Range   ABO/RH(D) O POS    Antibody Screen NEG    Sample Expiration      01/25/2023,2359 Performed at [facility], [place] [address] [place], [place] [address]   CBC     Status: Abnormal   Collection Time: 01/23/23 12:34 AM  Result Value Ref Range   WBC 9.6 4.0 - 10.5 K/uL   RBC 3.01 (L) 4.22 - 5.81 MIL/uL   Hemoglobin 10.9 (L) 13.0 - 17.0 g/dL   HCT 30.6 (L) 39.0 - 52.0 %   MCV 101.7 (H) 80.0 - 100.0 fL   MCH 36.2 (H) 26.0 - 34.0 pg   MCHC 35.6 30.0 - 36.0 g/dL   RDW 18.3 (H) 11.5 - 15.5 %   Platelets 105 (L) 150 - 400 K/uL   nRBC 0.0 0.0 - 0.2 %    Comment: Performed at [facility], [place] [address] [place], [place] [address]  Rapid urine drug screen (hospital performed)     Status: Abnormal   Collection Time: 01/23/23 12:52 AM  Result Value Ref Range   Opiates POSITIVE (A) NONE DETECTED   Cocaine NONE DETECTED NONE DETECTED   Benzodiazepines NONE DETECTED NONE DETECTED   Amphetamines NONE DETECTED NONE DETECTED   Tetrahydrocannabinol POSITIVE (A) NONE DETECTED   Barbiturates NONE DETECTED NONE DETECTED    Comment: (NOTE) DRUG SCREEN FOR MEDICAL PURPOSES ONLY.  IF CONFIRMATION IS NEEDED FOR ANY PURPOSE, NOTIFY LAB WITHIN 5 DAYS.  LOWEST DETECTABLE LIMITS FOR URINE DRUG SCREEN Drug Class                     Cutoff (ng/mL) Amphetamine and metabolites    1000 Barbiturate and metabolites    200 Benzodiazepine                 200 Opiates and metabolites        300 Cocaine and metabolites        300 THC                            50 Performed at [facility], [place] [address], [place], [place] [address]   Prealbumin     Status: Abnormal   Collection Time: 01/23/23  4:23 AM  Result Value Ref Range   Prealbumin <5 (L) 18 - 38 mg/dL    Comment: Performed at [place] [address],  [place], [place] [address]  CBC     Status: Abnormal   Collection Time: 01/23/23  4:23 AM  Result Value  Ref Range   WBC 11.0 (H) 4.0 - 10.5 K/uL   RBC 2.98 (L) 4.22 - 5.81 MIL/uL   Hemoglobin 10.8 (L) 13.0 - 17.0 g/dL   HCT 29.7 (L) 39.0 - 52.0 %   MCV 99.7 80.0 - 100.0 fL   MCH 36.2 (H) 26.0 - 34.0 pg   MCHC 36.4 (H) 30.0 - 36.0 g/dL   RDW 18.5 (H) 11.5 - 15.5 %   Platelets 100 (L) 150 - 400 K/uL   nRBC 0.0 0.0 - 0.2 %    Comment: Performed at [facility], [place] [address], [place], [place] [address]  Comprehensive metabolic panel     Status: Abnormal   Collection Time: 01/23/23  4:55 AM  Result Value Ref Range   Sodium 134 (L) 135 - 145 mmol/L   Potassium 3.8 3.5 - 5.1 mmol/L   Chloride 98 98 - 111 mmol/L   CO2 26 22 - 32 mmol/L   Glucose, Bld 104 (H) 70 - 99 mg/dL    Comment: Glucose reference range applies only to samples taken after fasting for at least 8 hours.   BUN 10 6 - 20 mg/dL   Creatinine, Ser 0.90 0.61 - 1.24 mg/dL   Calcium 8.7 (L) 8.9 - 10.3 mg/dL   Total Protein 6.2 (L) 6.5 - 8.1 g/dL   Albumin 2.3 (L) 3.5 - 5.0 g/dL   AST 69 (H) 15 - 41 U/L   ALT 26 0 - 44 U/L   Alkaline Phosphatase 147 (H) 38 - 126 U/L   Total Bilirubin 6.6 (H) 0.3 - 1.2 mg/dL   GFR, Estimated >60 >60 mL/min    Comment: (NOTE) Calculated using the CKD-EPI Creatinine Equation (2021)    Anion gap 10 5 - 15    Comment: Performed at [facility], [place] [address], [place], [place] [address]  Magnesium     Status: None   Collection Time: 01/23/23  4:55 AM  Result Value Ref Range   Magnesium 1.8 1.7 - 2.4 mg/dL    Comment: Performed at [facility], [place] [address] [place], [place] [address]  Phosphorus     Status: None   Collection Time: 01/23/23  4:55 AM  Result Value Ref Range   Phosphorus 4.0 2.5 - 4.6 mg/dL    Comment: Performed at [facility], [place] [address] [place], [place] [address]  CBC     Status: Abnormal   Collection Time: 01/23/23  7:50 AM  Result Value Ref Range   WBC 11.1 (H) 4.0 - 10.5 K/uL   RBC  2.90 (L) 4.22 - 5.81 MIL/uL   Hemoglobin 10.4 (L) 13.0 - 17.0 g/dL   HCT 29.1 (L) 39.0 - 52.0 %   MCV 100.3 (H) 80.0 - 100.0 fL   MCH 35.9 (H) 26.0 - 34.0 pg   MCHC 35.7 30.0 - 36.0 g/dL   RDW 18.1 (H) 11.5 - 15.5 %   Platelets 102 (L) 150 - 400 K/uL   nRBC 0.0 0.0 - 0.2 %    Comment: Performed at [facility], [name] [address] [place], [place] [address]  Ethanol     Status: None   Collection Time: 01/23/23 10:15 AM  Result Value Ref Range   Alcohol, Ethyl (B) <10 <10 mg/dL    Comment: (NOTE) Lowest detectable limit for serum alcohol  is 10 mg/dL.  For medical purposes only. Performed at [name], [place] [address], [place], [place] [address]   CBC     Status: Abnormal   Collection Time: 01/23/23  1:43 PM  Result Value Ref Range   WBC 9.2 4.0 - 10.5 K/uL   RBC 2.84 (L) 4.22 - 5.81 MIL/uL   Hemoglobin 10.2 (L) 13.0 - 17.0 g/dL   HCT 28.6 (L) 39.0 - 52.0 %   MCV 100.7 (H) 80.0 - 100.0 fL   MCH 35.9 (H) 26.0 - 34.0 pg   MCHC 35.7 30.0 - 36.0 g/dL   RDW 18.3 (H) 11.5 - 15.5 %   Platelets 96 (L) 150 - 400 K/uL    Comment: SPECIMEN CHECKED FOR CLOTS Immature Platelet Fraction may be clinically indicated, consider ordering this additional test [id number] REPEATED TO VERIFY PLATELET COUNT CONFIRMED BY SMEAR    nRBC 0.0 0.0 - 0.2 %    Comment: Performed at [facility], [place] [address] [place], [place] [address]    CT HAND RIGHT W CONTRAST  Result Date: 01/23/2023 CLINICAL DATA:  [place] 2 days ago.  Marked hand swelling. EXAM: CT OF THE UPPER RIGHT EXTREMITY WITH CONTRAST TECHNIQUE: Multidetector CT imaging of the upper right extremity was performed according to the standard protocol following intravenous contrast administration. RADIATION DOSE REDUCTION: This exam was performed according to the departmental dose-optimization program which includes automated exposure control, adjustment of the mA and/or kV according to patient  size and/or use of iterative reconstruction technique. CONTRAST:  32m OMNIPAQUE IOHEXOL 300 MG/ML  SOLN COMPARISON:  Radiographs, 01/22/2023 FINDINGS: Remote healed fifth metacarpal neck fracture. No acute hand or wrist fractures are identified. No significant arthropathic findings. Diffuse severe subcutaneous soft tissue swelling/edema/fluid most notably involving the dorsum of the hand. No discrete rim enhancing fluid collection to suggest a drainable soft tissue abscess. No discrete measurable hematoma. Grossly by CT the major tendons and ligaments are intact. IMPRESSION: 1. Diffuse severe subcutaneous soft tissue swelling/edema/fluid most notably involving the dorsum of the hand. No discrete rim enhancing fluid collection to suggest a drainable soft tissue abscess. No discrete hematoma. 2. Remote healed fifth metacarpal neck fracture. 3. No acute hand or wrist fractures are identified. Electronically Signed   By: PMarijo SanesM.D.   On: 01/23/2023 15:02   UE Venous Duplex ([facility] and [facility] ONLY)  Result Date: 01/23/2023 UPPER VENOUS STUDY  Patient Name:  JLAVOY CUSANO Date of Exam:   01/23/2023 Medical Rec #: [id number]       Accession #:    [id number] of Birth: [birth year]        Patient Gender: M Patient Age:   89years [facility]      VAS [place]PPER EXTREMITY VENOUS DUPLEX Referring Phys: ANyoka LintDOUTOVA --------------------------------------------------------------------------------  Indications: Swelling Risk Factors: None identified. Comparison Study: No prior studies. Performing Technologist: GOliver HumRVT  Examination Guidelines: A complete evaluation includes B-mode imaging, spectral Doppler, color Doppler, and power Doppler as needed of all accessible portions of each vessel. Bilateral testing is considered an integral part of a complete examination. Limited examinations for reoccurring indications may be performed as noted.  Right Findings:  +----------+------------+---------+-----------+----------+-------+ RIGHT     CompressiblePhasicitySpontaneousPropertiesSummary +----------+------------+---------+-----------+----------+-------+ IJV           Full       Yes       Yes                      +----------+------------+---------+-----------+----------+-------+  Subclavian    Full       Yes       Yes                      +----------+------------+---------+-----------+----------+-------+ Axillary      Full       Yes       Yes                      +----------+------------+---------+-----------+----------+-------+ Brachial      Full       Yes       Yes                      +----------+------------+---------+-----------+----------+-------+ Radial        Full                                          +----------+------------+---------+-----------+----------+-------+ Ulnar         Full                                          +----------+------------+---------+-----------+----------+-------+ Cephalic      Full                                          +----------+------------+---------+-----------+----------+-------+ Basilic       Full                                          +----------+------------+---------+-----------+----------+-------+  Left Findings: +----------+------------+---------+-----------+----------+-------+ LEFT      CompressiblePhasicitySpontaneousPropertiesSummary +----------+------------+---------+-----------+----------+-------+ Subclavian    Full       Yes       Yes                      +----------+------------+---------+-----------+----------+-------+  Summary:  Right: No evidence of deep vein thrombosis in the upper extremity. No evidence of superficial vein thrombosis in the upper extremity.  Left: No evidence of thrombosis in the subclavian.  *See table(s) above for measurements and observations.  Diagnosing physician: Deitra Mayo MD Electronically signed by  Deitra Mayo MD on 01/23/2023 at 10:35:25 AM.    Final    CT ABDOMEN PELVIS W CONTRAST  Result Date: 01/22/2023 CLINICAL DATA:  Acute abdominal pain EXAM: CT ABDOMEN AND PELVIS WITH CONTRAST TECHNIQUE: Multidetector CT imaging of the abdomen and pelvis was performed using the standard protocol following bolus administration of intravenous contrast. RADIATION DOSE REDUCTION: This exam was performed according to the departmental dose-optimization program which includes automated exposure control, adjustment of the mA and/or kV according to patient size and/or use of iterative reconstruction technique. CONTRAST:  125m OMNIPAQUE IOHEXOL 300 MG/ML  SOLN COMPARISON:  CT chest abdomen and pelvis 12/01/2022 FINDINGS: Lower chest: No acute abnormality. Hepatobiliary: Gallbladder is dilated. Small gallstones are present. There is mild gallbladder wall edema. Common bile duct is slightly prominent in size. No focal liver lesions are seen. There is no intrahepatic biliary ductal dilatation. Pancreas: Unremarkable. No pancreatic ductal dilatation or surrounding inflammatory changes. Spleen: Normal in size without focal abnormality.  Adrenals/Urinary Tract: Adrenal glands are unremarkable. Kidneys are normal, without renal calculi, focal lesion, or hydronephrosis. Bladder is unremarkable. Stomach/Bowel: There is some wall thickening of small bowel loops in the left abdomen with associated mesenteric edema. There is no evidence for bowel obstruction. There is no pneumatosis or free air. There is colonic diverticulosis without evidence for acute diverticulitis. The appendix is within normal limits. Vascular/Lymphatic: Aortic atherosclerosis. No enlarged abdominal or pelvic lymph nodes. Reproductive: Prostate is unremarkable. Other: There is a small supraumbilical ventral hernia containing mesenteric fat and a small amount of free fluid. There is small volume ascites throughout the abdomen and pelvis. There is a small fat  containing left inguinal hernia. Musculoskeletal: Multilevel degenerative changes affect the spine IMPRESSION: 1. Wall thickening of small bowel loops in the left abdomen with associated mesenteric edema. Findings are compatible with nonspecific enteritis. 2. Small volume ascites. 3. Cholelithiasis with gallbladder wall edema and prominence of the common bile duct. Correlate clinically for acute cholecystitis. Consider ultrasound. 4. Colonic diverticulosis without evidence for acute diverticulitis. 5. Small supraumbilical ventral hernia containing mesenteric fat and free fluid. Aortic Atherosclerosis (ICD10-I70.0). Electronically Signed   By: Ronney Asters M.D.   On: 01/22/2023 21:32   DG Femur Min 2 Views Left  Result Date: 01/22/2023 CLINICAL DATA:  Left thigh hematoma EXAM: LEFT FEMUR 2 VIEWS COMPARISON:  None Available. FINDINGS: There is no evidence of fracture or other focal bone lesions. Soft tissues are unremarkable. IMPRESSION: No acute osseous abnormality Electronically Signed   By: Jill Side M.D.   On: 01/22/2023 20:04   DG Forearm Right  Result Date: 01/22/2023 CLINICAL DATA:  Right arm swelling and bruising. EXAM: RIGHT FOREARM - 2 VIEW COMPARISON:  None Available. FINDINGS: There is no evidence of fracture or other focal bone lesions. Soft tissue swelling is present over the dorsum of the right hand. IMPRESSION: No acute osseous abnormality. Electronically Signed   By: Brett Fairy M.D.   On: 01/22/2023 20:04   DG Hand Complete Right  Result Date: 01/22/2023 CLINICAL DATA:  Right arm swelling.  No history of trauma. EXAM: RIGHT HAND - COMPLETE 3+ VIEW COMPARISON:  08/03/2022 FINDINGS: Degenerative changes in the interphalangeal joints. Old fracture deformity of the right fifth metacarpal bone. Prominent dorsal soft tissue swelling. No soft tissue gas or radiopaque foreign bodies. No evidence of acute fracture or dislocation. IMPRESSION: 1. Diffuse soft tissue swelling over the dorsum of  the right hand. 2. No acute fracture or dislocation. Old fracture deformity of the fifth metacarpal bone. Electronically Signed   By: Lucienne Capers M.D.   On: 01/22/2023 20:01   US SCROTUM W/DOPPLER  Result Date: 01/22/2023 CLINICAL DATA:  Testicle pain. EXAM: SCROTAL ULTRASOUND DOPPLER ULTRASOUND OF THE TESTICLES TECHNIQUE: Complete ultrasound examination of the testicles, epididymis, and other scrotal structures was performed. Color and spectral Doppler ultrasound were also utilized to evaluate blood flow to the testicles. COMPARISON:  None Available. FINDINGS: Right testicle Measurements: 3.1 x 2.2 x 2.8 cm. Heterogeneous parenchyma without a discrete or focal mass lesion evident. Tiny 4 x 3 x 3 mm peripheral simple cyst identified. Left testicle Measurements: 3.4 x 1.5 x 2.3 cm. No mass or microlithiasis visualized. Right epididymis:  4 mm epididymal cyst or spermatocele. Left epididymis:  Normal in size and appearance. Hydrocele:  None visualized. Varicocele: Venous channels in the right hemiscrotum dilate up to 2.8 mm without solid maneuver. Pulsed Doppler interrogation of both testes demonstrates normal low resistance arterial and venous waveforms bilaterally. IMPRESSION:  1. No evidence for testicular torsion or epididymal orchitis. 2. Heterogeneous parenchyma of the right testicle without a discrete or focal mass lesion. This is a nonspecific finding and could be related to prior trauma or infiltrative process. Consider follow-up ultrasound in 3 months to assess for stability. 3. 4 mm epididymal cyst or spermatocele in the right epididymis. 4. Borderline right-sided varicocele. Venous channels in the right hemiscrotum dilate up to 2.8 mm without solid maneuver. Electronically Signed   By: Misty Stanley M.D.   On: 01/22/2023 19:53    Review of Systems  HENT:  Negative for ear discharge, ear pain, hearing loss and tinnitus.   Eyes:  Negative for photophobia and pain.  Respiratory:  Negative for  cough and shortness of breath.   Cardiovascular:  Negative for chest pain.  Gastrointestinal:  Negative for abdominal pain, nausea and vomiting.  Genitourinary:  Negative for dysuria, flank pain, frequency and urgency.  Musculoskeletal:  Positive for arthralgias (Right hand). Negative for back pain, myalgias and neck pain.  Neurological:  Negative for dizziness and headaches.  Hematological:  Does not bruise/bleed easily.  Psychiatric/Behavioral:  Positive for suicidal ideas. The patient is not nervous/anxious.    Blood pressure (!) 93/55, pulse (!) 53, temperature 98.4 F (36.9 C), temperature source Oral, resp. rate 18, SpO2 100 %. Physical Exam Constitutional:      General: He is not in acute distress.    Appearance: He is well-developed. He is not diaphoretic.  HENT:     Head: Normocephalic and atraumatic.  Eyes:     General: No scleral icterus.       Right eye: No discharge.        Left eye: No discharge.     Conjunctiva/sclera: Conjunctivae normal.  Cardiovascular:     Rate and Rhythm: Normal rate and regular rhythm.  Pulmonary:     Effort: Pulmonary effort is normal. No respiratory distress.  Musculoskeletal:     Cervical back: Normal range of motion.     Comments: Right shoulder, elbow, wrist, digits- no skin wounds, hand dorsal swelling 2/2 hematoma, mod TTP hand, no instability, no blocks to motion  Sens  Ax/R/M/U intact  Mot   Ax/ R/ PIN/ M/ AIN/ U intact but limited by swelling  Rad 2+  Skin:    General: Skin is warm and dry.  Neurological:     Mental Status: He is alert.  Psychiatric:        Mood and Affect: Mood normal.        Behavior: Behavior normal.     Assessment/Plan: Right hand hematoma -- No surgical indication here given his coagulopathy. Advise bulky dressing to guard against future trauma, elevation, and ice.     Lisette Abu, PA-C Orthopedic Surgery [phone] 01/23/2023, 3:33 PM

## 2023-01-23 NOTE — Assessment & Plan Note (Signed)
-   Spoke about importance of quitting spent 5 minutes discussing options for treatment, prior attempts at quitting, and dangers of smoking  -At this point patient is    interested in quitting  - refused nicotine patch   - nursing tobacco cessation protocol   
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
## 2023-01-23 NOTE — Assessment & Plan Note (Addendum)
Consult behavioral health Suicidal precautions  Pt states he wants to go home and plans to "end it all" IVC paper work completed

## 2023-01-23 NOTE — ED Notes (Signed)
Report received by previous RN. Pt arrives d/t concern for ecchymosis and swelling of groin, left upper thigh, right forearm, and right hand. Pt denies any trauma. Denies fall. States he might have bumped his arm and awoke with bruising and swelling. No urinary problems. Pt endorses hx cirrhosis. Pt has belongings at bedside. Call bell within reach. Pt connected to VS monitor. Pt denies any additional needs.

## 2023-01-23 NOTE — ED Notes (Signed)
ED TO INPATIENT HANDOFF REPORT  ED Nurse Name and Phone #: Rocco Pauls Name/Age/Gender Nathaniel Warren 57 y.o. male Room/Bed: WA24/WA24  Code Status   Code Status: Full Code  Home/SNF/Other Home Patient oriented to: self, place, time, and situation Is this baseline? Yes   Triage Complete: Triage complete  Chief Complaint Spontaneous hematoma of lower leg [R23.3]  Triage Note Pt reports hematoma to left groin, discoloration to penis and thighs, and right arm with swelling.  Swelling and bruising noted to right arm in triage. Pt denies fall/trauma to right arm. Denies swelling to testicle.  Hx cirrhosis.    Allergies No Known Allergies  Level of Care/Admitting Diagnosis ED Disposition     ED Disposition  Admit   Condition  --   Comment  Hospital Area: [place] [facility] [[id number]]  Level of Care: Progressive [102]  Admit to Progressive based on following criteria: MULTISYSTEM THREATS such as stable sepsis, metabolic/electrolyte imbalance with or without encephalopathy that is responding to early treatment.  May admit patient to [facility] or [facility] if equivalent level of care is available:: No  Covid Evaluation: Asymptomatic - no recent exposure (last 10 days) testing not required  Diagnosis: Spontaneous hematoma of lower leg [id number]  Admitting Physician: Nita Sells [id number]  Attending Physician: Nita Sells [id number]  Certification:: I certify this patient will need inpatient services for at least 2 midnights  Estimated Length of Stay: 2          B Medical/Surgery History Past Medical History:  Diagnosis Date   Depression    Hernia, abdominal    Liver failure ([place])    Seizures ([place])    Stroke [facility])    Past Surgical History:  Procedure Laterality Date   ANKLE SURGERY     ANKLE SURGERY Right    HERNIA REPAIR     SPLENECTOMY       A IV Location/Drains/Wounds Patient Lines/Drains/Airways Status     Active  Line/Drains/Airways     Name Placement date Placement time Site Days   Peripheral IV 01/22/23 20 G 1" Left Antecubital 01/22/23  2042  Antecubital  1   Wound / Incision (Open or Dehisced) 02/22/22 Skin tear Arm Left;Lower;Posterior Pink and red wound bed 02/22/22  2002  Arm  335            Intake/Output Last 24 hours  Intake/Output Summary (Last 24 hours) at 01/23/2023 1414 Last data filed at 01/23/2023 [id number] Gross per 24 hour  Intake --  Output 380 ml  Net -380 ml    Labs/Imaging Results for orders placed or performed during the hospital encounter of 01/22/23 (from the past 48 hour(s))  TSH     Status: None   Collection Time: 01/22/23 12:34 AM  Result Value Ref Range   TSH 1.369 0.350 - 4.500 uIU/mL    Comment: Performed by a 3rd Generation assay with a functional sensitivity of <=0.01 uIU/mL. Performed at [facility], [place] [address] [place], [place] [address]   Vitamin B12     Status: Abnormal   Collection Time: 01/22/23 12:34 AM  Result Value Ref Range   Vitamin B-12 3,144 (H) 180 - 914 pg/mL    Comment: RESULT CONFIRMED BY MANUAL DILUTION (NOTE) This assay is not validated for testing neonatal or myeloproliferative syndrome specimens for Vitamin B12 levels. Performed at [facility], [place] [address], [place],  [address]   Folate     Status: None   Collection  Time: 01/22/23 12:34 AM  Result Value Ref Range   Folate 14.4 >5.9 ng/mL    Comment: Performed at [facility], [place] [address] [place], [place] [address]  Iron and TIBC     Status: Abnormal   Collection Time: 01/22/23 12:34 AM  Result Value Ref Range   Iron 144 45 - 182 ug/dL   TIBC 130 (L) 250 - 450 ug/dL   Saturation Ratios 111 (H) 17.9 - 39.5 %   UIBC NOT CALCULATED ug/dL    Comment: Performed at [facility], [place] [address] [place], [place] [address]  Ferritin     Status: Abnormal   Collection Time: 01/22/23 12:34 AM   Result Value Ref Range   Ferritin 838 (H) 24 - 336 ng/mL    Comment: ICTERUS AT THIS LEVEL MAY AFFECT RESULT Performed at [place] [address], [place], [place] [address]   Reticulocytes     Status: Abnormal   Collection Time: 01/22/23 12:34 AM  Result Value Ref Range   Retic Ct Pct 2.8 0.4 - 3.1 %   RBC. 2.97 (L) 4.22 - 5.81 MIL/uL   Retic Count, Absolute 84.3 19.0 - 186.0 K/uL   Immature Retic Fract 7.7 2.3 - 15.9 %    Comment: Performed at [facility], [place] [address] [place], [place] [address]  Osmolality     Status: None   Collection Time: 01/22/23 12:46 AM  Result Value Ref Range   Osmolality 288 275 - 295 mOsm/kg    Comment: Performed at [place] [address], [place], [place] [address]  CK     Status: None   Collection Time: 01/22/23 12:46 AM  Result Value Ref Range   Total CK 81 49 - 397 U/L    Comment: Performed at [facility], [place] [address] [place], [place] [address]  Magnesium     Status: Abnormal   Collection Time: 01/22/23 12:46 AM  Result Value Ref Range   Magnesium 1.6 (L) 1.7 - 2.4 mg/dL    Comment: Performed at [facility], [place] [address], [place], [place] [address]  Phosphorus     Status: None   Collection Time: 01/22/23 12:46 AM  Result Value Ref Range   Phosphorus 3.3 2.5 - 4.6 mg/dL    Comment: Performed at [facility], [place] [address] [place], [place] [address]  Urinalysis, Complete w Microscopic -Urine, Clean Catch     Status: Abnormal   Collection Time: 01/22/23 12:52 AM  Result Value Ref Range   Color, Urine AMBER (A) YELLOW    Comment: BIOCHEMICALS MAY BE AFFECTED BY COLOR   APPearance CLEAR CLEAR   Specific Gravity, Urine 1.044 (H) 1.005 - 1.030   pH 5.0 5.0 - 8.0   Glucose, UA NEGATIVE NEGATIVE mg/dL   Hgb urine dipstick NEGATIVE NEGATIVE   Bilirubin Urine NEGATIVE NEGATIVE   Ketones, ur NEGATIVE NEGATIVE mg/dL   Protein,  ur NEGATIVE NEGATIVE mg/dL   Nitrite NEGATIVE NEGATIVE   Leukocytes,Ua NEGATIVE NEGATIVE   RBC / HPF 0-5 0 - 5 RBC/hpf   WBC, UA 0-5 0 - 5 WBC/hpf   Bacteria, UA NONE SEEN NONE SEEN   Squamous Epithelial / HPF 0-5 0 - 5 /HPF   Mucus PRESENT    Hyaline Casts, UA PRESENT     Comment: Performed at [facility], [place] [address] [place], [place] [address]  Osmolality, urine  Status: None   Collection Time: 01/22/23 12:53 AM  Result Value Ref Range   Osmolality, Ur 458 300 - 900 mOsm/kg    Comment: Performed at [place] [address], [place], [place] [address]  Sodium, urine, random     Status: None   Collection Time: 01/22/23 12:53 AM  Result Value Ref Range   Sodium, Ur 48 mmol/L    Comment: Performed at [facility], [place] [address], [place], [place] [address]  Creatinine, urine, random     Status: None   Collection Time: 01/22/23 12:53 AM  Result Value Ref Range   Creatinine, Urine 121 mg/dL    Comment: Performed at [facility], [place] [address], [place], [place] [address]  CBC with Differential     Status: Abnormal   Collection Time: 01/22/23  7:28 PM  Result Value Ref Range   WBC 8.8 4.0 - 10.5 K/uL   RBC 3.14 (L) 4.22 - 5.81 MIL/uL   Hemoglobin 11.3 (L) 13.0 - 17.0 g/dL   HCT 31.8 (L) 39.0 - 52.0 %   MCV 101.3 (H) 80.0 - 100.0 fL   MCH 36.0 (H) 26.0 - 34.0 pg   MCHC 35.5 30.0 - 36.0 g/dL   RDW 18.5 (H) 11.5 - 15.5 %   Platelets 108 (L) 150 - 400 K/uL    Comment: SPECIMEN CHECKED FOR CLOTS CONSISTENT WITH PREVIOUS RESULT REPEATED TO VERIFY    nRBC 0.3 (H) 0.0 - 0.2 %   Neutrophils Relative % 56 %   Neutro Abs 5.0 1.7 - 7.7 K/uL   Lymphocytes Relative 25 %   Lymphs Abs 2.2 0.7 - 4.0 K/uL   Monocytes Relative 14 %   Monocytes Absolute 1.3 (H) 0.1 - 1.0 K/uL   Eosinophils Relative 3 %   Eosinophils Absolute 0.3 0.0 - 0.5 K/uL   Basophils Relative 1 %   Basophils Absolute 0.1 0.0 - 0.1 K/uL    Immature Granulocytes 1 %   Abs Immature Granulocytes 0.04 0.00 - 0.07 K/uL    Comment: Performed at [facility], [place] [address] [place], [place] [address]  Comprehensive metabolic panel     Status: Abnormal   Collection Time: 01/22/23  7:28 PM  Result Value Ref Range   Sodium 131 (L) 135 - 145 mmol/L   Potassium 3.8 3.5 - 5.1 mmol/L   Chloride 97 (L) 98 - 111 mmol/L   CO2 25 22 - 32 mmol/L   Glucose, Bld 97 70 - 99 mg/dL    Comment: Glucose reference range applies only to samples taken after fasting for at least 8 hours.   BUN 9 6 - 20 mg/dL   Creatinine, Ser 0.89 0.61 - 1.24 mg/dL   Calcium 8.4 (L) 8.9 - 10.3 mg/dL   Total Protein 6.9 6.5 - 8.1 g/dL   Albumin 2.6 (L) 3.5 - 5.0 g/dL   AST 85 (H) 15 - 41 U/L   ALT 30 0 - 44 U/L   Alkaline Phosphatase 170 (H) 38 - 126 U/L   Total Bilirubin 6.8 (H) 0.3 - 1.2 mg/dL   GFR, Estimated >60 >60 mL/min    Comment: (NOTE) Calculated using the CKD-EPI Creatinine Equation (2021)    Anion gap 9 5 - 15    Comment: Performed at [facility], [place] [address], [place], [place] [address]  Brain natriuretic peptide     Status: Abnormal   Collection Time: 01/22/23  7:28 PM  Result Value  Ref Range   B Natriuretic Peptide 161.2 (H) 0.0 - 100.0 pg/mL    Comment: Performed at [facility], [place] [address], [place], [place] [address]  Protime-INR     Status: Abnormal   Collection Time: 01/22/23  7:28 PM  Result Value Ref Range   Prothrombin Time 22.1 (H) 11.4 - 15.2 seconds   INR 2.0 (H) 0.8 - 1.2    Comment: (NOTE) INR goal varies based on device and disease states. Performed at [facility], [place] [address], [place], [place] [address]   Troponin I (High Sensitivity)     Status: None   Collection Time: 01/22/23  7:28 PM  Result Value Ref Range   Troponin I (High Sensitivity) 6 <18 ng/L    Comment: (NOTE) Elevated high sensitivity troponin I (hsTnI) values and  significant  changes across serial measurements may suggest ACS but many other  chronic and acute conditions are known to elevate hsTnI results.  Refer to the "Links" section for chest pain algorithms and additional  guidance. Performed at [facility], [place] [address] [place], [place] [address]   Troponin I (High Sensitivity)     Status: None   Collection Time: 01/22/23  8:50 PM  Result Value Ref Range   Troponin I (High Sensitivity) 6 <18 ng/L    Comment: (NOTE) Elevated high sensitivity troponin I (hsTnI) values and significant  changes across serial measurements may suggest ACS but many other  chronic and acute conditions are known to elevate hsTnI results.  Refer to the "Links" section for chest pain algorithms and additional  guidance. Performed at [facility], [place] [address] [place], [place] [address]   Type and screen     Status: None   Collection Time: 01/22/23 10:51 PM  Result Value Ref Range   ABO/RH(D) O POS    Antibody Screen NEG    Sample Expiration      01/25/2023,2359 Performed at [facility], [place] [address] [place], [place] [address]   CBC     Status: Abnormal   Collection Time: 01/23/23 12:34 AM  Result Value Ref Range   WBC 9.6 4.0 - 10.5 K/uL   RBC 3.01 (L) 4.22 - 5.81 MIL/uL   Hemoglobin 10.9 (L) 13.0 - 17.0 g/dL   HCT 30.6 (L) 39.0 - 52.0 %   MCV 101.7 (H) 80.0 - 100.0 fL   MCH 36.2 (H) 26.0 - 34.0 pg   MCHC 35.6 30.0 - 36.0 g/dL   RDW 18.3 (H) 11.5 - 15.5 %   Platelets 105 (L) 150 - 400 K/uL   nRBC 0.0 0.0 - 0.2 %    Comment: Performed at [facility], [place] [address] [place], [place] [address]  Rapid urine drug screen (hospital performed)     Status: Abnormal   Collection Time: 01/23/23 12:52 AM  Result Value Ref Range   Opiates POSITIVE (A) NONE DETECTED   Cocaine NONE DETECTED NONE DETECTED   Benzodiazepines NONE DETECTED NONE DETECTED   Amphetamines NONE  DETECTED NONE DETECTED   Tetrahydrocannabinol POSITIVE (A) NONE DETECTED   Barbiturates NONE DETECTED NONE DETECTED    Comment: (NOTE) DRUG SCREEN FOR MEDICAL PURPOSES ONLY.  IF CONFIRMATION IS NEEDED FOR ANY PURPOSE, NOTIFY LAB WITHIN 5 DAYS.  LOWEST DETECTABLE LIMITS FOR URINE DRUG SCREEN Drug Class                     Cutoff (ng/mL) Amphetamine and  metabolites    1000 Barbiturate and metabolites    200 Benzodiazepine                 200 Opiates and metabolites        300 Cocaine and metabolites        300 THC                            50 Performed at [facility], [place] [address], [place], [place] [address]   Prealbumin     Status: Abnormal   Collection Time: 01/23/23  4:23 AM  Result Value Ref Range   Prealbumin <5 (L) 18 - 38 mg/dL    Comment: Performed at [place] [address], [place], [place] [address]  CBC     Status: Abnormal   Collection Time: 01/23/23  4:23 AM  Result Value Ref Range   WBC 11.0 (H) 4.0 - 10.5 K/uL   RBC 2.98 (L) 4.22 - 5.81 MIL/uL   Hemoglobin 10.8 (L) 13.0 - 17.0 g/dL   HCT 29.7 (L) 39.0 - 52.0 %   MCV 99.7 80.0 - 100.0 fL   MCH 36.2 (H) 26.0 - 34.0 pg   MCHC 36.4 (H) 30.0 - 36.0 g/dL   RDW 18.5 (H) 11.5 - 15.5 %   Platelets 100 (L) 150 - 400 K/uL   nRBC 0.0 0.0 - 0.2 %    Comment: Performed at [facility], [place] [address], [place], [place] [address]  Comprehensive metabolic panel     Status: Abnormal   Collection Time: 01/23/23  4:55 AM  Result Value Ref Range   Sodium 134 (L) 135 - 145 mmol/L   Potassium 3.8 3.5 - 5.1 mmol/L   Chloride 98 98 - 111 mmol/L   CO2 26 22 - 32 mmol/L   Glucose, Bld 104 (H) 70 - 99 mg/dL    Comment: Glucose reference range applies only to samples taken after fasting for at least 8 hours.   BUN 10 6 - 20 mg/dL   Creatinine, Ser 0.90 0.61 - 1.24 mg/dL   Calcium 8.7 (L) 8.9 - 10.3 mg/dL   Total Protein 6.2 (L) 6.5 - 8.1 g/dL   Albumin 2.3 (L) 3.5 - 5.0 g/dL    AST 69 (H) 15 - 41 U/L   ALT 26 0 - 44 U/L   Alkaline Phosphatase 147 (H) 38 - 126 U/L   Total Bilirubin 6.6 (H) 0.3 - 1.2 mg/dL   GFR, Estimated >60 >60 mL/min    Comment: (NOTE) Calculated using the CKD-EPI Creatinine Equation (2021)    Anion gap 10 5 - 15    Comment: Performed at [facility], [place] [address] [place], [place] [address]  Magnesium     Status: None   Collection Time: 01/23/23  4:55 AM  Result Value Ref Range   Magnesium 1.8 1.7 - 2.4 mg/dL    Comment: Performed at [facility], [place] [address], [place], [place] [address]  Phosphorus     Status: None   Collection Time: 01/23/23  4:55 AM  Result Value Ref Range   Phosphorus 4.0 2.5 - 4.6 mg/dL    Comment: Performed at [facility], [place] [address] [place], [place] [address]  CBC     Status: Abnormal   Collection Time: 01/23/23  7:50 AM  Result Value Ref Range   WBC 11.1 (H) 4.0 -  10.5 K/uL   RBC 2.90 (L) 4.22 - 5.81 MIL/uL   Hemoglobin 10.4 (L) 13.0 - 17.0 g/dL   HCT 29.1 (L) 39.0 - 52.0 %   MCV 100.3 (H) 80.0 - 100.0 fL   MCH 35.9 (H) 26.0 - 34.0 pg   MCHC 35.7 30.0 - 36.0 g/dL   RDW 18.1 (H) 11.5 - 15.5 %   Platelets 102 (L) 150 - 400 K/uL   nRBC 0.0 0.0 - 0.2 %    Comment: Performed at [facility], [place] [address], [place], [place] [address]  CBC     Status: Abnormal   Collection Time: 01/23/23  1:43 PM  Result Value Ref Range   WBC 9.2 4.0 - 10.5 K/uL   RBC 2.84 (L) 4.22 - 5.81 MIL/uL   Hemoglobin 10.2 (L) 13.0 - 17.0 g/dL   HCT 28.6 (L) 39.0 - 52.0 %   MCV 100.7 (H) 80.0 - 100.0 fL   MCH 35.9 (H) 26.0 - 34.0 pg   MCHC 35.7 30.0 - 36.0 g/dL   RDW 18.3 (H) 11.5 - 15.5 %   Platelets 96 (L) 150 - 400 K/uL    Comment: SPECIMEN CHECKED FOR CLOTS Immature Platelet Fraction may be clinically indicated, consider ordering this additional test [id number] REPEATED TO VERIFY PLATELET COUNT CONFIRMED BY SMEAR    nRBC 0.0 0.0 - 0.2  %    Comment: Performed at [facility], [place] [address] [place], [place] [address]   UE Venous Duplex [facility] and [facility] ONLY)  Result Date: 01/23/2023 UPPER VENOUS STUDY  Patient Name:  Nathaniel Warren  Date of Exam:   01/23/2023 Medical Rec #: [id number]        Accession #:    [id number] Date of Birth: [birth year]         Patient Gender: M Patient Age:   49 years Exam Location:  [facility] Procedure:      VAS [place] UPPER EXTREMITY VENOUS DUPLEX Referring Phys: Nyoka Lint DOUTOVA --------------------------------------------------------------------------------  Indications: Swelling Risk Factors: None identified. Comparison Study: No prior studies. Performing Technologist: Oliver Hum RVT  Examination Guidelines: A complete evaluation includes B-mode imaging, spectral Doppler, color Doppler, and power Doppler as needed of all accessible portions of each vessel. Bilateral testing is considered an integral part of a complete examination. Limited examinations for reoccurring indications may be performed as noted.  Right Findings: +----------+------------+---------+-----------+----------+-------+ RIGHT     CompressiblePhasicitySpontaneousPropertiesSummary +----------+------------+---------+-----------+----------+-------+ IJV           Full       Yes       Yes                      +----------+------------+---------+-----------+----------+-------+ Subclavian    Full       Yes       Yes                      +----------+------------+---------+-----------+----------+-------+ Axillary      Full       Yes       Yes                      +----------+------------+---------+-----------+----------+-------+ Brachial      Full       Yes       Yes                      +----------+------------+---------+-----------+----------+-------+ Radial  Full                                          +----------+------------+---------+-----------+----------+-------+ Ulnar          Full                                          +----------+------------+---------+-----------+----------+-------+ Cephalic      Full                                          +----------+------------+---------+-----------+----------+-------+ Basilic       Full                                          +----------+------------+---------+-----------+----------+-------+  Left Findings: +----------+------------+---------+-----------+----------+-------+ LEFT      CompressiblePhasicitySpontaneousPropertiesSummary +----------+------------+---------+-----------+----------+-------+ Subclavian    Full       Yes       Yes                      +----------+------------+---------+-----------+----------+-------+  Summary:  Right: No evidence of deep vein thrombosis in the upper extremity. No evidence of superficial vein thrombosis in the upper extremity.  Left: No evidence of thrombosis in the subclavian.  *See table(s) above for measurements and observations.  Diagnosing physician: Deitra Mayo MD Electronically signed by Deitra Mayo MD on 01/23/2023 at 10:35:25 AM.    Final    CT ABDOMEN PELVIS W CONTRAST  Result Date: 01/22/2023 CLINICAL DATA:  Acute abdominal pain EXAM: CT ABDOMEN AND PELVIS WITH CONTRAST TECHNIQUE: Multidetector CT imaging of the abdomen and pelvis was performed using the standard protocol following bolus administration of intravenous contrast. RADIATION DOSE REDUCTION: This exam was performed according to the departmental dose-optimization program which includes automated exposure control, adjustment of the mA and/or kV according to patient size and/or use of iterative reconstruction technique. CONTRAST:  144m OMNIPAQUE IOHEXOL 300 MG/ML  SOLN COMPARISON:  CT chest abdomen and pelvis 12/01/2022 FINDINGS: Lower chest: No acute abnormality. Hepatobiliary: Gallbladder is dilated. Small gallstones are present. There is mild gallbladder wall edema. Common bile  duct is slightly prominent in size. No focal liver lesions are seen. There is no intrahepatic biliary ductal dilatation. Pancreas: Unremarkable. No pancreatic ductal dilatation or surrounding inflammatory changes. Spleen: Normal in size without focal abnormality. Adrenals/Urinary Tract: Adrenal glands are unremarkable. Kidneys are normal, without renal calculi, focal lesion, or hydronephrosis. Bladder is unremarkable. Stomach/Bowel: There is some wall thickening of small bowel loops in the left abdomen with associated mesenteric edema. There is no evidence for bowel obstruction. There is no pneumatosis or free air. There is colonic diverticulosis without evidence for acute diverticulitis. The appendix is within normal limits. Vascular/Lymphatic: Aortic atherosclerosis. No enlarged abdominal or pelvic lymph nodes. Reproductive: Prostate is unremarkable. Other: There is a small supraumbilical ventral hernia containing mesenteric fat and a small amount of free fluid. There is small volume ascites throughout the abdomen and pelvis. There is a small fat containing left inguinal hernia. Musculoskeletal: Multilevel degenerative changes affect the spine IMPRESSION: 1. Wall thickening of small bowel loops in the  left abdomen with associated mesenteric edema. Findings are compatible with nonspecific enteritis. 2. Small volume ascites. 3. Cholelithiasis with gallbladder wall edema and prominence of the common bile duct. Correlate clinically for acute cholecystitis. Consider ultrasound. 4. Colonic diverticulosis without evidence for acute diverticulitis. 5. Small supraumbilical ventral hernia containing mesenteric fat and free fluid. Aortic Atherosclerosis (ICD10-I70.0). Electronically Signed   By: Ronney Asters M.D.   On: 01/22/2023 21:32   DG Femur Min 2 Views Left  Result Date: 01/22/2023 CLINICAL DATA:  Left thigh hematoma EXAM: LEFT FEMUR 2 VIEWS COMPARISON:  None Available. FINDINGS: There is no evidence of fracture  or other focal bone lesions. Soft tissues are unremarkable. IMPRESSION: No acute osseous abnormality Electronically Signed   By: Jill Side M.D.   On: 01/22/2023 20:04   DG Forearm Right  Result Date: 01/22/2023 CLINICAL DATA:  Right arm swelling and bruising. EXAM: RIGHT FOREARM - 2 VIEW COMPARISON:  None Available. FINDINGS: There is no evidence of fracture or other focal bone lesions. Soft tissue swelling is present over the dorsum of the right hand. IMPRESSION: No acute osseous abnormality. Electronically Signed   By: Brett Fairy M.D.   On: 01/22/2023 20:04   DG Hand Complete Right  Result Date: 01/22/2023 CLINICAL DATA:  Right arm swelling.  No history of trauma. EXAM: RIGHT HAND - COMPLETE 3+ VIEW COMPARISON:  08/03/2022 FINDINGS: Degenerative changes in the interphalangeal joints. Old fracture deformity of the right fifth metacarpal bone. Prominent dorsal soft tissue swelling. No soft tissue gas or radiopaque foreign bodies. No evidence of acute fracture or dislocation. IMPRESSION: 1. Diffuse soft tissue swelling over the dorsum of the right hand. 2. No acute fracture or dislocation. Old fracture deformity of the fifth metacarpal bone. Electronically Signed   By: Lucienne Capers M.D.   On: 01/22/2023 20:01   US SCROTUM W/DOPPLER  Result Date: 01/22/2023 CLINICAL DATA:  Testicle pain. EXAM: SCROTAL ULTRASOUND DOPPLER ULTRASOUND OF THE TESTICLES TECHNIQUE: Complete ultrasound examination of the testicles, epididymis, and other scrotal structures was performed. Color and spectral Doppler ultrasound were also utilized to evaluate blood flow to the testicles. COMPARISON:  None Available. FINDINGS: Right testicle Measurements: 3.1 x 2.2 x 2.8 cm. Heterogeneous parenchyma without a discrete or focal mass lesion evident. Tiny 4 x 3 x 3 mm peripheral simple cyst identified. Left testicle Measurements: 3.4 x 1.5 x 2.3 cm. No mass or microlithiasis visualized. Right epididymis:  4 mm epididymal cyst or  spermatocele. Left epididymis:  Normal in size and appearance. Hydrocele:  None visualized. Varicocele: Venous channels in the right hemiscrotum dilate up to 2.8 mm without solid maneuver. Pulsed Doppler interrogation of both testes demonstrates normal low resistance arterial and venous waveforms bilaterally. IMPRESSION: 1. No evidence for testicular torsion or epididymal orchitis. 2. Heterogeneous parenchyma of the right testicle without a discrete or focal mass lesion. This is a nonspecific finding and could be related to prior trauma or infiltrative process. Consider follow-up ultrasound in 3 months to assess for stability. 3. 4 mm epididymal cyst or spermatocele in the right epididymis. 4. Borderline right-sided varicocele. Venous channels in the right hemiscrotum dilate up to 2.8 mm without solid maneuver. Electronically Signed   By: Misty Stanley M.D.   On: 01/22/2023 19:53    Pending Labs Unresulted Labs (From admission, onward)     Start     Ordered   01/23/23 1015  Ethanol  Add-on,   AD        01/23/23 1014   01/23/23  [id number]  Occult blood card to lab, stool RN will collect  ONCE - STAT,   STAT       Question:  Specimen to be collected by:  Answer:  RN will collect   01/23/23 0716   01/23/23 0100  CBC  Now then every 6 hours,   R (with TIMED occurrences)     Question:  Release to patient  Answer:  Immediate   01/22/23 2259   01/22/23 2302  Vitamin B1  Once,   R        01/22/23 2302            Vitals/Pain Today's Vitals   01/23/23 1130 01/23/23 1213 01/23/23 1241 01/23/23 1300  BP:  (!) 96/58  97/61  Pulse: (!) 57 64  (!) 57  Resp: 13 12  13  $ Temp:  98.2 F (36.8 C)    TempSrc:      SpO2: 94% 95%  95%  PainSc:   5      Isolation Precautions No active isolations  Medications Medications  furosemide (LASIX) tablet 40 mg (40 mg Oral Given 01/23/23 0911)  spironolactone (ALDACTONE) tablet 50 mg (50 mg Oral Given 01/23/23 0911)  acetaminophen (TYLENOL) tablet 650 mg (has no  administration in time range)    Or  acetaminophen (TYLENOL) suppository 650 mg (has no administration in time range)  HYDROcodone-acetaminophen (NORCO/VICODIN) 5-325 MG per tablet 1-2 tablet (2 tablets Oral Given 01/23/23 1342)  sodium chloride flush (NS) 0.9 % injection 3 mL (3 mLs Intravenous Not Given 01/23/23 0911)  sodium chloride flush (NS) 0.9 % injection 3 mL (has no administration in time range)  0.9 %  sodium chloride infusion (has no administration in time range)  thiamine (VITAMIN B1) tablet 100 mg (100 mg Oral Given [id number])  folic acid (FOLVITE) tablet 1 mg (1 mg Oral Given 01/23/23 0911)  morphine (PF) 2 MG/ML injection 2 mg (2 mg Intravenous Given 01/23/23 1210)  morphine (PF) 4 MG/ML injection 4 mg (4 mg Intravenous Given 01/22/23 2057)  iohexol (OMNIPAQUE) 300 MG/ML solution 100 mL (100 mLs Intravenous Contrast Given 01/22/23 2107)    Mobility walks     Focused Assessments musculoskeletal   R Recommendations: See Admitting Provider Note  Report given to:   Additional Notes: n/a

## 2023-01-23 NOTE — ED Notes (Signed)
Blood sample light green tube was recollected and sent to lab

## 2023-01-23 NOTE — ED Notes (Signed)
Sitter at bedside 

## 2023-01-23 NOTE — ED Notes (Signed)
Pt is aware that he can receive pain medications as needed every 3 hours. Pain medications available after 4 am. Pt states he is just tired of everything that he can't seem to get any help. Pt is also upset as neighboring patients have been vomiting, yelling out, and banging on walls not allowing pt to sleep. Pt was readjusted. Pain medications given. Call bell within reach

## 2023-01-23 NOTE — ED Notes (Signed)
Pt called out went to see how I can assist he expressed that he was frustrated due to stating he had called out for ob=ver an hour. Pt stated no one cares we are all here for a check and he should just kill himself, he talked about all the times he can get his pain meds, upset about the noise from each side of his room that he can't sleep . RN notified about pt requesting pain meds.

## 2023-01-23 NOTE — ED Notes (Addendum)
Checked on pt he was calm, asked if he received his pain meds he said yes, he asked for a gingerale

## 2023-01-23 NOTE — Progress Notes (Signed)
Right upper extremity venous duplex has been completed. Preliminary results can be found in CV Proc through chart review.  Results were given to Dr. Regenia Skeeter.  01/23/23 9:05 AM Nathaniel Warren RVT

## 2023-01-23 NOTE — ED Notes (Addendum)
Pt endorses physical and financial needs. Pt states he is currently living with a friend. Has been unable to work d/t chronic RLE pain with impaired ambulation. He was told he does not qualify for surgery d/t lack of insurance and hx of cirrhosis. Pt has lost his home. Pt has lost his income. Pt no longer receiving food assistance. Pt state with everything going on and him in his condition in which he is he rather end it all. He says he's attempted SI in the past. Pt informed social work consult pending  Unaware if MD informed pt of IVC. No officer noted at bedside.

## 2023-01-24 DIAGNOSIS — R45851 Suicidal ideations: Secondary | ICD-10-CM

## 2023-01-24 LAB — COMPREHENSIVE METABOLIC PANEL
ALT: 23 U/L (ref 0–44)
AST: 59 U/L — ABNORMAL HIGH (ref 15–41)
Albumin: 2.1 g/dL — ABNORMAL LOW (ref 3.5–5.0)
Alkaline Phosphatase: 169 U/L — ABNORMAL HIGH (ref 38–126)
Anion gap: 10 (ref 5–15)
BUN: 12 mg/dL (ref 6–20)
CO2: 26 mmol/L (ref 22–32)
Calcium: 8.4 mg/dL — ABNORMAL LOW (ref 8.9–10.3)
Chloride: 100 mmol/L (ref 98–111)
Creatinine, Ser: 0.97 mg/dL (ref 0.61–1.24)
GFR, Estimated: >60 mL/min (ref >60)
Glucose, Bld: 115 mg/dL — ABNORMAL HIGH (ref 70–99)
Potassium: 4 mmol/L (ref 3.5–5.1)
Sodium: 136 mmol/L (ref 135–145)
Total Bilirubin: 5.3 mg/dL — ABNORMAL HIGH (ref 0.3–1.2)
Total Protein: 6 g/dL — ABNORMAL LOW (ref 6.5–8.1)

## 2023-01-24 LAB — CBC
HCT: 27.9 % — ABNORMAL LOW (ref 39.0–52.0)
Hemoglobin: 10.2 g/dL — ABNORMAL LOW (ref 13.0–17.0)
MCH: 36.6 pg — ABNORMAL HIGH (ref 26.0–34.0)
MCHC: 36.6 g/dL — ABNORMAL HIGH (ref 30.0–36.0)
MCV: 100 fL (ref 80.0–100.0)
Platelets: 92 10*3/uL — ABNORMAL LOW (ref 150–400)
RBC: 2.79 MIL/uL — ABNORMAL LOW (ref 4.22–5.81)
RDW: 17.8 % — ABNORMAL HIGH (ref 11.5–15.5)
WBC: 9.1 10*3/uL (ref 4.0–10.5)
nRBC: 0 % (ref 0.0–0.2)

## 2023-01-24 LAB — PROTIME-INR
INR: 2 — ABNORMAL HIGH (ref 0.8–1.2)
Prothrombin Time: 22.9 seconds — ABNORMAL HIGH (ref 11.4–15.2)

## 2023-01-24 MED ORDER — VITAMIN K1 10 MG/ML IJ SOLN
10.0000 mg | Freq: Once | INTRAVENOUS | Status: AC
  Administered 2023-01-24: 10 mg via INTRAVENOUS
  Filled 2023-01-24 (×2): qty 1

## 2023-01-24 NOTE — Consult Note (Signed)
[facility] Face-to-Face Psychiatry Consult    Patient Identification: Nathaniel Warren MRN:  [id number] Principal Diagnosis: Spontaneous hematoma of lower leg Diagnosis:  Principal Problem:   Spontaneous hematoma of lower leg Active Problems:   Hyponatremia   Tobacco abuse   Alcoholic cirrhosis of liver with ascites ([place])   Suicidal ideations   Total Time spent with patient: 20 minutes  Subjective:   Nathaniel Warren is a 57 year old male with right hand injury, thigh hematoma.  he has a psychiatric h/o bipolar d/o vs mdd, gad, ptsd, alcohol use disorder.   On exam today pt is irritable. He has similar complaints as noted in the psych note from yesterday 2-16 about assistance allocated unfairly to persons in this country. He reports his mood is irritable. He reports worthless and hopeless.  He reports that sleep is okay and appetite is low.  Reports SI active w/o intent and w/o plan. Sitter at bedside.  Denies HI.  Denies psychotic symptoms.  Reports generalized anxiety.   He declines to restart previous outpatient psych meds - zoloft, gabapentin, trazodone, and seroquel.   He is help seeking/help rejecting for most of the interview.      Past Psychiatric History: bipolar d/o vs mdd, gad, ptsd, alcohol use disorder.   Risk to Self:  yes  Risk to Others:  impulsive but denying HI  Prior Inpatient Therapy:   Prior Outpatient Therapy:    Past Medical History:  Past Medical History:  Diagnosis Date   Depression    Hernia, abdominal    Liver failure (HCC)    Seizures ([place])    Stroke ([place])     Past Surgical History:  Procedure Laterality Date   ANKLE SURGERY     ANKLE SURGERY Right    HERNIA REPAIR     SPLENECTOMY     Family History:  Family History  Problem Relation Age of Onset   COPD Mother    Diabetes Father    Stroke Maternal Grandmother    Alzheimer's disease Maternal Grandfather    Cancer Paternal Grandmother    Stomach cancer Paternal Grandfather     Hypertension Other    Colon cancer Neg Hx    Esophageal cancer Neg Hx    Colon polyps Neg Hx    Inflammatory bowel disease Neg Hx    Liver disease Neg Hx    Pancreatic cancer Neg Hx    Rectal cancer Neg Hx    Family Psychiatric  History: did not report   Social History:  Social History   Substance and Sexual Activity  Alcohol Use Not Currently     Social History   Substance and Sexual Activity  Drug Use Yes   Types: Marijuana    Social History   Socioeconomic History   Marital status: Single    Spouse name: Not on file   Number of children: 2   Years of education: 14   Highest education level: Not on file  Occupational History   Occupation: Nature conservation officer  Tobacco Use   Smoking status: Every Day    Packs/day: 1.00    Years: 30.00    Total pack years: 30.00    Types: Cigarettes   Smokeless tobacco: Former  Scientific laboratory technician Use: Never used  Substance and Sexual Activity   Alcohol use: Not Currently   Drug use: Yes    Types: Marijuana   Sexual activity: Yes    Comment:  wife had hysterectomy  Other Topics Concern  Not on file  Social History Narrative   ** Merged History Encounter **    Right handed   Unable to work   Lives with roommate and cousin   Drinks caffeine   One floor house   Social Determinants of Health   Financial Resource Strain: High Risk (07/15/2022)   Overall Financial Resource Strain (CARDIA)    Difficulty of Paying Living Expenses: Very hard  Food Insecurity: Food Insecurity Present (01/23/2023)   Hunger Vital Sign    Worried About Running Out of Food in the Last Year: Sometimes true    Ran Out of Food in the Last Year: Sometimes true  Transportation Needs: Unmet Transportation Needs (01/23/2023)   PRAPARE - Hydrologist (Medical): Yes    Lack of Transportation (Non-Medical): Yes  Physical Activity: Inactive (07/15/2022)   Exercise Vital Sign    Days of Exercise per Week: 0 days    Minutes of  Exercise per Session: 0 min  Stress: Stress Concern Present (07/15/2022)   Bath of Stress : Very much  Social Connections: Socially Isolated (07/15/2022)   Social Connection and Isolation Panel [NHANES]    Frequency of Communication with Friends and Family: More than three times a week    Frequency of Social Gatherings with Friends and Family: Once a week    Attends Religious Services: Never    Marine scientist or Organizations: No    Attends Archivist Meetings: Never    Marital Status: Divorced   Additional Social History:    Allergies:  No Known Allergies  Labs:  Results for orders placed or performed during the hospital encounter of 01/22/23 (from the past 48 hour(s))  CBC with Differential     Status: Abnormal   Collection Time: 01/22/23  7:28 PM  Result Value Ref Range   WBC 8.8 4.0 - 10.5 K/uL   RBC 3.14 (L) 4.22 - 5.81 MIL/uL   Hemoglobin 11.3 (L) 13.0 - 17.0 g/dL   HCT 31.8 (L) 39.0 - 52.0 %   MCV 101.3 (H) 80.0 - 100.0 fL   MCH 36.0 (H) 26.0 - 34.0 pg   MCHC 35.5 30.0 - 36.0 g/dL   RDW 18.5 (H) 11.5 - 15.5 %   Platelets 108 (L) 150 - 400 K/uL    Comment: SPECIMEN CHECKED FOR CLOTS CONSISTENT WITH PREVIOUS RESULT REPEATED TO VERIFY    nRBC 0.3 (H) 0.0 - 0.2 %   Neutrophils Relative % 56 %   Neutro Abs 5.0 1.7 - 7.7 K/uL   Lymphocytes Relative 25 %   Lymphs Abs 2.2 0.7 - 4.0 K/uL   Monocytes Relative 14 %   Monocytes Absolute 1.3 (H) 0.1 - 1.0 K/uL   Eosinophils Relative 3 %   Eosinophils Absolute 0.3 0.0 - 0.5 K/uL   Basophils Relative 1 %   Basophils Absolute 0.1 0.0 - 0.1 K/uL   Immature Granulocytes 1 %   Abs Immature Granulocytes 0.04 0.00 - 0.07 K/uL    Comment: Performed at [facility], [place] [address], [place], [place] [address]  Comprehensive metabolic panel     Status: Abnormal   Collection Time: 01/22/23  7:28 PM  Result Value Ref  Range   Sodium 131 (L) 135 - 145 mmol/L   Potassium 3.8 3.5 - 5.1 mmol/L   Chloride 97 (L) 98 - 111 mmol/L   CO2 25 22 - 32 mmol/L  Glucose, Bld 97 70 - 99 mg/dL    Comment: Glucose reference range applies only to samples taken after fasting for at least 8 hours.   BUN 9 6 - 20 mg/dL   Creatinine, Ser 0.89 0.61 - 1.24 mg/dL   Calcium 8.4 (L) 8.9 - 10.3 mg/dL   Total Protein 6.9 6.5 - 8.1 g/dL   Albumin 2.6 (L) 3.5 - 5.0 g/dL   AST 85 (H) 15 - 41 U/L   ALT 30 0 - 44 U/L   Alkaline Phosphatase 170 (H) 38 - 126 U/L   Total Bilirubin 6.8 (H) 0.3 - 1.2 mg/dL   GFR, Estimated >60 >60 mL/min    Comment: (NOTE) Calculated using the CKD-EPI Creatinine Equation (2021)    Anion gap 9 5 - 15    Comment: Performed at [facility], [place] [address], [place], [place] [address]  Brain natriuretic peptide     Status: Abnormal   Collection Time: 01/22/23  7:28 PM  Result Value Ref Range   B Natriuretic Peptide 161.2 (H) 0.0 - 100.0 pg/mL    Comment: Performed at [facility], [place] [address], [place], [place] [address]  Protime-INR     Status: Abnormal   Collection Time: 01/22/23  7:28 PM  Result Value Ref Range   Prothrombin Time 22.1 (H) 11.4 - 15.2 seconds   INR 2.0 (H) 0.8 - 1.2    Comment: (NOTE) INR goal varies based on device and disease states. Performed at [facility], [place] [address], [place], [place] [address]   Troponin I (High Sensitivity)     Status: None   Collection Time: 01/22/23  7:28 PM  Result Value Ref Range   Troponin I (High Sensitivity) 6 <18 ng/L    Comment: (NOTE) Elevated high sensitivity troponin I (hsTnI) values and significant  changes across serial measurements may suggest ACS but many other  chronic and acute conditions are known to elevate hsTnI results.  Refer to the "Links" section for chest pain algorithms and additional  guidance. Performed at [facility], [place] [address] [place], [place] [address]   Troponin I (High Sensitivity)     Status: None   Collection Time: 01/22/23  8:50 PM  Result Value Ref Range   Troponin I (High Sensitivity) 6 <18 ng/L    Comment: (NOTE) Elevated high sensitivity troponin I (hsTnI) values and significant  changes across serial measurements may suggest ACS but many other  chronic and acute conditions are known to elevate hsTnI results.  Refer to the "Links" section for chest pain algorithms and additional  guidance. Performed at [facility], [place] [address] [place], [place] [address]   Type and screen     Status: None   Collection Time: 01/22/23 10:51 PM  Result Value Ref Range   ABO/RH(D) O POS    Antibody Screen NEG    Sample Expiration      01/25/2023,2359 Performed at [facility], [place] [address], [place], [place] [address]   CBC     Status: Abnormal   Collection Time: 01/23/23 12:34 AM  Result Value Ref Range   WBC 9.6 4.0 - 10.5 K/uL   RBC 3.01 (L) 4.22 - 5.81 MIL/uL   Hemoglobin 10.9 (L) 13.0 - 17.0 g/dL   HCT 30.6 (L) 39.0 - 52.0 %   MCV 101.7 (H) 80.0 - 100.0 fL   MCH 36.2 (H) 26.0 - 34.0 pg   MCHC 35.6  30.0 - 36.0 g/dL   RDW 18.3 (H) 11.5 - 15.5 %   Platelets 105 (L) 150 - 400 K/uL   nRBC 0.0 0.0 - 0.2 %    Comment: Performed at [facility], [place] [address], [place], [place] [address]  Rapid urine drug screen (hospital performed)     Status: Abnormal   Collection Time: 01/23/23 12:52 AM  Result Value Ref Range   Opiates POSITIVE (A) NONE DETECTED   Cocaine NONE DETECTED NONE DETECTED   Benzodiazepines NONE DETECTED NONE DETECTED   Amphetamines NONE DETECTED NONE DETECTED   Tetrahydrocannabinol POSITIVE (A) NONE DETECTED   Barbiturates NONE DETECTED NONE DETECTED    Comment: (NOTE) DRUG SCREEN FOR MEDICAL PURPOSES ONLY.  IF CONFIRMATION IS NEEDED FOR ANY PURPOSE, NOTIFY LAB WITHIN 5 DAYS.  LOWEST DETECTABLE LIMITS FOR URINE DRUG  SCREEN Drug Class                     Cutoff (ng/mL) Amphetamine and metabolites    1000 Barbiturate and metabolites    200 Benzodiazepine                 200 Opiates and metabolites        300 Cocaine and metabolites        300 THC                            50 Performed at [facility], [place] [address], [place], [place] [address]   Prealbumin     Status: Abnormal   Collection Time: 01/23/23  4:23 AM  Result Value Ref Range   Prealbumin <5 (L) 18 - 38 mg/dL    Comment: Performed at [place] [address], [place], [place] [address]  CBC     Status: Abnormal   Collection Time: 01/23/23  4:23 AM  Result Value Ref Range   WBC 11.0 (H) 4.0 - 10.5 K/uL   RBC 2.98 (L) 4.22 - 5.81 MIL/uL   Hemoglobin 10.8 (L) 13.0 - 17.0 g/dL   HCT 29.7 (L) 39.0 - 52.0 %   MCV 99.7 80.0 - 100.0 fL   MCH 36.2 (H) 26.0 - 34.0 pg   MCHC 36.4 (H) 30.0 - 36.0 g/dL   RDW 18.5 (H) 11.5 - 15.5 %   Platelets 100 (L) 150 - 400 K/uL   nRBC 0.0 0.0 - 0.2 %    Comment: Performed at [facility], [place] [address] [place],  [address]  Comprehensive metabolic panel     Status: Abnormal   Collection Time: 01/23/23  4:55 AM  Result Value Ref Range   Sodium 134 (L) 135 - 145 mmol/L   Potassium 3.8 3.5 - 5.1 mmol/L   Chloride 98 98 - 111 mmol/L   CO2 26 22 - 32 mmol/L   Glucose, Bld 104 (H) 70 - 99 mg/dL    Comment: Glucose reference range applies only to samples taken after fasting for at least 8 hours.   BUN 10 6 - 20 mg/dL   Creatinine, Ser 0.90 0.61 - 1.24 mg/dL   Calcium 8.7 (L) 8.9 - 10.3 mg/dL   Total Protein 6.2 (L) 6.5 - 8.1 g/dL   Albumin 2.3 (L) 3.5 - 5.0 g/dL   AST 69 (H) 15 - 41 U/L   ALT 26 0 - 44 U/L   Alkaline Phosphatase 147 (H) 38 - 126 U/L  Total Bilirubin 6.6 (H) 0.3 - 1.2 mg/dL   GFR, Estimated >60 >60 mL/min    Comment: (NOTE) Calculated using the CKD-EPI Creatinine Equation (2021)    Anion gap 10 5 - 15    Comment: Performed at  [facility], [place] [address] [place], [place] [address]  Magnesium     Status: None   Collection Time: 01/23/23  4:55 AM  Result Value Ref Range   Magnesium 1.8 1.7 - 2.4 mg/dL    Comment: Performed at [facility], [place] [address] [place], [place] [address]  Phosphorus     Status: None   Collection Time: 01/23/23  4:55 AM  Result Value Ref Range   Phosphorus 4.0 2.5 - 4.6 mg/dL    Comment: Performed at [facility], [place] [address], [place], [place] [address]  CBC     Status: Abnormal   Collection Time: 01/23/23  7:50 AM  Result Value Ref Range   WBC 11.1 (H) 4.0 - 10.5 K/uL   RBC 2.90 (L) 4.22 - 5.81 MIL/uL   Hemoglobin 10.4 (L) 13.0 - 17.0 g/dL   HCT 29.1 (L) 39.0 - 52.0 %   MCV 100.3 (H) 80.0 - 100.0 fL   MCH 35.9 (H) 26.0 - 34.0 pg   MCHC 35.7 30.0 - 36.0 g/dL   RDW 18.1 (H) 11.5 - 15.5 %   Platelets 102 (L) 150 - 400 K/uL   nRBC 0.0 0.0 - 0.2 %    Comment: Performed at [facility], [place] [address] [place], [place] [address]  Ethanol     Status: None   Collection Time: 01/23/23 10:15 AM  Result Value Ref Range   Alcohol, Ethyl (B) <10 <10 mg/dL    Comment: (NOTE) Lowest detectable limit for serum alcohol is 10 mg/dL.  For medical purposes only. Performed at [facility], [place] [address], [place], [place] [address]   CBC     Status: Abnormal   Collection Time: 01/23/23  1:43 PM  Result Value Ref Range   WBC 9.2 4.0 - 10.5 K/uL   RBC 2.84 (L) 4.22 - 5.81 MIL/uL   Hemoglobin 10.2 (L) 13.0 - 17.0 g/dL   HCT 28.6 (L) 39.0 - 52.0 %   MCV 100.7 (H) 80.0 - 100.0 fL   MCH 35.9 (H) 26.0 - 34.0 pg   MCHC 35.7 30.0 - 36.0 g/dL   RDW 18.3 (H) 11.5 - 15.5 %   Platelets 96 (L) 150 - 400 K/uL    Comment: SPECIMEN CHECKED FOR CLOTS Immature Platelet Fraction may be clinically indicated, consider ordering this additional test [id number] REPEATED TO VERIFY PLATELET COUNT CONFIRMED BY  SMEAR    nRBC 0.0 0.0 - 0.2 %    Comment: Performed at [facility], [place] [address] [place], [place] [address]  CBC     Status: Abnormal   Collection Time: 01/23/23  6:59 PM  Result Value Ref Range   WBC 9.7 4.0 - 10.5 K/uL   RBC 2.80 (L) 4.22 - 5.81 MIL/uL   Hemoglobin 10.3 (L) 13.0 - 17.0 g/dL   HCT 28.5 (L) 39.0 - 52.0 %   MCV 101.8 (H) 80.0 - 100.0 fL   MCH 36.8 (H) 26.0 - 34.0 pg   MCHC 36.1 (H) 30.0 - 36.0 g/dL   RDW 17.9 (H) 11.5 - 15.5 %   Platelets 93 (L) 150 - 400 K/uL    Comment: Immature Platelet Fraction may be clinically  indicated, consider ordering this additional test [id number] CONSISTENT WITH PREVIOUS RESULT REPEATED TO VERIFY    nRBC 0.0 0.0 - 0.2 %    Comment: Performed at [facility], [place] [address] [place], [place] [address]  Vitamin B12     Status: Abnormal   Collection Time: 01/23/23  6:59 PM  Result Value Ref Range   Vitamin B-12 3,080 (H) 180 - 914 pg/mL    Comment: HEMOLYSIS AT THIS LEVEL MAY AFFECT RESULT RESULT CONFIRMED BY MANUAL DILUTION (NOTE) This assay is not validated for testing neonatal or myeloproliferative syndrome specimens for Vitamin B12 levels. Performed at [facility], [place] [address], [place], [place] [address]   TSH     Status: None   Collection Time: 01/23/23  6:59 PM  Result Value Ref Range   TSH 2.111 0.350 - 4.500 uIU/mL    Comment: Performed by a 3rd Generation assay with a functional sensitivity of <=0.01 uIU/mL. Performed at [facility], [place] [address], [place], [place] [address]   CBC     Status: Abnormal   Collection Time: 01/24/23  1:02 AM  Result Value Ref Range   WBC 9.1 4.0 - 10.5 K/uL   RBC 2.79 (L) 4.22 - 5.81 MIL/uL   Hemoglobin 10.2 (L) 13.0 - 17.0 g/dL   HCT 27.9 (L) 39.0 - 52.0 %   MCV 100.0 80.0 - 100.0 fL   MCH 36.6 (H) 26.0 - 34.0 pg   MCHC 36.6 (H) 30.0 - 36.0 g/dL   RDW 17.8 (H) 11.5 - 15.5 %   Platelets 92 (L) 150 -  400 K/uL    Comment: CONSISTENT WITH PREVIOUS RESULT   nRBC 0.0 0.0 - 0.2 %    Comment: Performed at [facility], [place] [address] [place], [place] [address]  Comprehensive metabolic panel     Status: Abnormal   Collection Time: 01/24/23  1:02 AM  Result Value Ref Range   Sodium 136 135 - 145 mmol/L   Potassium 4.0 3.5 - 5.1 mmol/L   Chloride 100 98 - 111 mmol/L   CO2 26 22 - 32 mmol/L   Glucose, Bld 115 (H) 70 - 99 mg/dL    Comment: Glucose reference range applies only to samples taken after fasting for at least 8 hours.   BUN 12 6 - 20 mg/dL   Creatinine, Ser 0.97 0.61 - 1.24 mg/dL   Calcium 8.4 (L) 8.9 - 10.3 mg/dL   Total Protein 6.0 (L) 6.5 - 8.1 g/dL   Albumin 2.1 (L) 3.5 - 5.0 g/dL   AST 59 (H) 15 - 41 U/L   ALT 23 0 - 44 U/L   Alkaline Phosphatase 169 (H) 38 - 126 U/L   Total Bilirubin 5.3 (H) 0.3 - 1.2 mg/dL   GFR, Estimated >60 >60 mL/min    Comment: (NOTE) Calculated using the CKD-EPI Creatinine Equation (2021)    Anion gap 10 5 - 15    Comment: Performed at [facility], [place] [address] [place], [place] [address]  Protime-INR     Status: Abnormal   Collection Time: 01/24/23  1:02 AM  Result Value Ref Range   Prothrombin Time 22.9 (H) 11.4 - 15.2 seconds   INR 2.0 (H) 0.8 - 1.2    Comment: (NOTE) INR goal varies based on device and disease states. Performed at [facility], [place] [address], [place], [place] [address]     Current Facility-Administered Medications  Medication Dose  Route Frequency Provider Last Rate Last Admin   0.9 %  sodium chloride infusion  250 mL Intravenous PRN Toy Baker, MD       acetaminophen (TYLENOL) tablet 650 mg  650 mg Oral Q6H PRN Toy Baker, MD       Or   acetaminophen (TYLENOL) suppository 650 mg  650 mg Rectal Q6H PRN Toy Baker, MD       folic acid (FOLVITE) tablet 1 mg  1 mg Oral Daily Doutova, Anastassia, MD   1 mg at 01/24/23 0944    furosemide (LASIX) tablet 40 mg  40 mg Oral Daily Toy Baker, MD   40 mg at 01/24/23 0944   HYDROcodone-acetaminophen (NORCO/VICODIN) 5-325 MG per tablet 1-2 tablet  1-2 tablet Oral Q4H PRN Toy Baker, MD   2 tablet at 01/24/23 0515   risperiDONE (RISPERDAL M-TABS) disintegrating tablet 2 mg  2 mg Oral Q8H PRN Starkes-Perry, Gayland Curry, FNP       And   LORazepam (ATIVAN) tablet 1 mg  1 mg Oral PRN Starkes-Perry, Gayland Curry, FNP       And   ziprasidone (GEODON) injection 20 mg  20 mg Intramuscular PRN Starkes-Perry, Gayland Curry, FNP       morphine (PF) 2 MG/ML injection 2 mg  2 mg Intravenous Q3H PRN Doutova, Anastassia, MD   2 mg at 01/24/23 [id number]   sodium chloride flush (NS) 0.9 % injection 3 mL  3 mL Intravenous Q12H Doutova, Anastassia, MD   3 mL at 01/24/23 0950   sodium chloride flush (NS) 0.9 % injection 3 mL  3 mL Intravenous PRN Doutova, Nyoka Lint, MD       spironolactone (ALDACTONE) tablet 50 mg  50 mg Oral Daily Doutova, Anastassia, MD   50 mg at 01/24/23 0945   thiamine (VITAMIN B1) tablet 100 mg  100 mg Oral Daily Toy Baker, MD   100 mg at 01/24/23 [id number]    Musculoskeletal: Strength & Muscle Tone: Laying in bed   Gait & Station: Laying in bed   Patient leans: Laying in bed             Psychiatric Specialty Exam:  Presentation  General Appearance:  Casual  Eye Contact: Fair  Speech: Normal Rate  Speech Volume: Normal  Handedness: Right   Mood and Affect  Mood: Irritable; Depressed  Affect: Full Range   Thought Process  Thought Processes: Linear  Descriptions of Associations:Intact  Orientation:Full (Time, Place and Person)  Thought Content:Logical  History of Schizophrenia/Schizoaffective disorder:No  Duration of Psychotic Symptoms:No data recorded Hallucinations:Hallucinations: None  Ideas of Reference:None  Suicidal Thoughts:Suicidal Thoughts: Yes, Active SI Active Intent and/or Plan: Without Intent; Without  Plan  Homicidal Thoughts:Homicidal Thoughts: No   Sensorium  Memory: Immediate Good; Recent Good; Remote Good  Judgment: Impaired  Insight: Lacking   Executive Functions  Concentration: Fair  Attention Span: Fair  Recall: Good  Fund of Knowledge: Good  Language: Fair   Psychomotor Activity  Psychomotor Activity: Psychomotor Activity: Normal   Assets  Assets: Communication Skills; Desire for Improvement; Physical Health; Leisure Time; Resilience   Sleep  Sleep: Sleep: Fair   Physical Exam: Physical Exam Vitals reviewed.  Skin:    Coloration: Skin is jaundiced.  Neurological:     Mental Status: He is alert.    Review of Systems  Psychiatric/Behavioral:  Positive for depression and suicidal ideas. The patient is nervous/anxious.    Blood pressure (!) 104/58, pulse 69, temperature 98.4 F (36.9 C), temperature source  Oral, resp. rate 14, height 5' 9"$  (1.753 m), weight 77.7 kg, SpO2 97 %. Body mass index is 25.3 kg/m.  Treatment Plan Summary:  Assessment: -MDD vs Bipoalr disorde r- pt refusing any scheduled psych meds -GAD -PTSD -Alcohol use disorder  Plan: -continue 1-1 sitter -Continue ivc -Continue recommendation for inpt psych admission when medically cleared -pt declines starting any scheduled psychiatric medications -consult SW to talk with patient, he would like some direction/assistance in regards to accessing social services    Disposition: Recommend psychiatric Inpatient admission when medically cleared. Supportive therapy provided about ongoing stressors.  Christoper Allegra, MD 01/24/2023 2:44 PM  Total Time Spent in Direct Patient Care:  I personally spent 40 minutes on the unit in direct patient care. The direct patient care time included face-to-face time with the patient, reviewing the patient's chart, communicating with other professionals, and coordinating care. Greater than 50% of this time was spent in counseling or  coordinating care with the patient regarding goals of hospitalization, psycho-education, and discharge planning needs.   Janine Limbo, MD Psychiatrist

## 2023-01-25 LAB — CBC WITH DIFFERENTIAL/PLATELET
Abs Immature Granulocytes: 0.04 K/uL (ref 0.00–0.07)
Basophils Absolute: 0.1 K/uL (ref 0.0–0.1)
Basophils Relative: 1 %
Eosinophils Absolute: 0.6 K/uL — ABNORMAL HIGH (ref 0.0–0.5)
Eosinophils Relative: 6 %
HCT: 28.2 % — ABNORMAL LOW (ref 39.0–52.0)
Hemoglobin: 10.1 g/dL — ABNORMAL LOW (ref 13.0–17.0)
Immature Granulocytes: 0 %
Lymphocytes Relative: 33 %
Lymphs Abs: 3.4 K/uL (ref 0.7–4.0)
MCH: 36.7 pg — ABNORMAL HIGH (ref 26.0–34.0)
MCHC: 35.8 g/dL (ref 30.0–36.0)
MCV: 102.5 fL — ABNORMAL HIGH (ref 80.0–100.0)
Monocytes Absolute: 1.3 K/uL — ABNORMAL HIGH (ref 0.1–1.0)
Monocytes Relative: 12 %
Neutro Abs: 4.9 K/uL (ref 1.7–7.7)
Neutrophils Relative %: 48 %
Platelets: 83 K/uL — ABNORMAL LOW (ref 150–400)
RBC: 2.75 MIL/uL — ABNORMAL LOW (ref 4.22–5.81)
RDW: 18.4 % — ABNORMAL HIGH (ref 11.5–15.5)
WBC: 10.2 K/uL (ref 4.0–10.5)
nRBC: 0 % (ref 0.0–0.2)

## 2023-01-25 LAB — PROTIME-INR
INR: 1.9 — ABNORMAL HIGH (ref 0.8–1.2)
Prothrombin Time: 21.6 seconds — ABNORMAL HIGH (ref 11.4–15.2)

## 2023-01-25 MED ORDER — OXYCODONE-ACETAMINOPHEN 5-325 MG PO TABS
2.0000 | ORAL_TABLET | ORAL | Status: DC | PRN
  Administered 2023-01-25 – 2023-03-01 (×92): 2 via ORAL
  Filled 2023-01-25 (×98): qty 2

## 2023-01-25 MED ORDER — VITAMIN K1 10 MG/ML IJ SOLN
10.0000 mg | Freq: Once | INTRAVENOUS | Status: AC
  Administered 2023-01-25: 10 mg via INTRAVENOUS
  Filled 2023-01-25: qty 1

## 2023-01-25 MED ORDER — ACETAMINOPHEN 325 MG PO TABS
650.0000 mg | ORAL_TABLET | Freq: Once | ORAL | Status: AC
  Administered 2023-01-25: 650 mg via ORAL
  Filled 2023-01-25: qty 2

## 2023-01-25 MED ORDER — FUROSEMIDE 10 MG/ML IJ SOLN
20.0000 mg | Freq: Once | INTRAMUSCULAR | Status: AC
  Administered 2023-01-25: 20 mg via INTRAVENOUS
  Filled 2023-01-25: qty 2

## 2023-01-25 MED ORDER — SODIUM CHLORIDE 0.9% IV SOLUTION: Freq: Once | INTRAVENOUS | Status: AC

## 2023-01-25 MED ORDER — DIPHENHYDRAMINE HCL 25 MG PO CAPS
25.0000 mg | ORAL_CAPSULE | Freq: Once | ORAL | Status: AC
  Administered 2023-01-25: 25 mg via ORAL
  Filled 2023-01-25: qty 1

## 2023-01-25 NOTE — TOC Initial Note (Signed)
Transition of Care [facility]) - Initial/Assessment Note    Patient Details  Name: Nathaniel Warren MRN: [id number] Date of Birth: 1966-08-12  Transition of Care [facility]) CM/SW Contact:    Vassie Moselle, LCSW Phone Number: 01/25/2023, 11:14 AM  Clinical Narrative:                 Pt currently recommended for inpatient psychiatric placement once medically stable. TOC following.   Expected Discharge Plan: Psychiatric Hospital Barriers to Discharge: Continued Medical Work up   Patient Goals and CMS Choice   CMS Medicare.gov Compare Post Acute Care list provided to:: Patient Choice offered to / list presented to : Patient [place] ownership interest in [facility].provided to::  (NA)    Expected Discharge Plan and Services In-house Referral: Clinical Social Work Discharge Planning Services: NA Post Acute Care Choice: NA Living arrangements for the past 2 months: Single Family Home                 DME Arranged: N/A DME Agency: NA                  Prior Living Arrangements/Services Living arrangements for the past 2 months: Single Family Home Lives with:: Friends Patient language and need for interpreter reviewed:: Yes Do you feel safe going back to the place where you live?: Yes      Need for Family Participation in Patient Care: No (Comment) Care giver support system in place?: No (comment)   Criminal Activity/Legal Involvement Pertinent to Current Situation/Hospitalization: No - Comment as needed  Activities of Daily Living Home Assistive Devices/Equipment: Cane (specify quad or straight) (StraightCane) ADL Screening (condition at time of admission) Patient's cognitive ability adequate to safely complete daily activities?: Yes Is the patient deaf or have difficulty hearing?: No Does the patient have difficulty seeing, even when wearing glasses/contacts?: No Does the patient have difficulty concentrating, remembering, or making decisions?: No Patient able to  express need for assistance with ADLs?: Yes Does the patient have difficulty dressing or bathing?: No Independently performs ADLs?: Yes (appropriate for developmental age) Does the patient have difficulty walking or climbing stairs?: Yes Weakness of Legs: Both Weakness of Arms/Hands: Right  Permission Sought/Granted   Permission granted to share information with : No              Emotional Assessment   Attitude/Demeanor/Rapport: Unable to Assess Affect (typically observed): Unable to Assess Orientation: : Oriented to Self, Oriented to Place, Oriented to  Time, Oriented to Situation Alcohol / Substance Use: Not Applicable Psych Involvement: Yes (comment)  Admission diagnosis:  Spontaneous hematoma of lower leg [R23.3] Hematoma of groin, initial encounter [S30.1XXA] Swelling of right hand [M79.89] Patient Active Problem List   Diagnosis Date Noted   Suicidal ideations 01/23/2023   Spontaneous hematoma of lower leg 01/22/2023   Tobacco abuse counseling 01/06/2023   Tooth pain 01/06/2023   Foot drop, right 12/06/2022   Nerve compression 12/04/2022   Hypoxia 12/04/2022   Ground glass opacity present on imaging of lung 12/04/2022   ABLA (acute blood loss anemia) 12/02/2022   Chest wall hematoma 12/02/2022   Chronic pain of right ankle 12/02/2022   Alcohol use disorder 08/08/2022   Ascites due to alcoholic cirrhosis ([place]) [id number]   Portal hypertension ([place]) 08/08/2022   Colon cancer screening 08/08/2022   Financial difficulties 08/08/2022   Posttraumatic stress disorder 07/15/2022   History of seizures 04/03/2022   Liver failure without hepatic coma ([place]) 03/05/2022  Alcoholic cirrhosis of liver with ascites ([place]) 02/24/2022   Anasarca 02/21/2022   Hypokalemia 02/21/2022   Hyponatremia 02/21/2022   Elevated LFTs 02/21/2022   Hyperbilirubinemia 02/21/2022   Elevated brain natriuretic peptide (BNP) level 02/21/2022   Tobacco abuse 02/21/2022   Thrombocytopenia ([place])  02/21/2022   Normocytic anemia 02/21/2022   Multiple lacunar infarcts ([place]) 10/21/2017   Drug overdose, intentional, initial encounter ([place]) 10/20/2017   Drug overdose, intentional self-harm, initial encounter ([place]) 10/20/2017   Seizures ([place]) 10/20/2017   Drug overdose 10/20/2017   Major depressive disorder, recurrent ([place]) 03/21/2016   Substance induced mood disorder ([place]) 03/20/2016   MDD (major depressive disorder), recurrent severe, without psychosis ([place]) 04/10/2013    Class: Acute   GAD (generalized anxiety disorder) 04/10/2013   Anxiety 01/11/2013   PCP:  [name], NP Pharmacy:   [facility] [address] Phone: [phone] Fax: [phone]     Social Determinants of Health (SDOH) Social History: [place]: Food Insecurity Present (01/23/2023)  Housing: Medium Risk (01/23/2023)  Transportation Needs: Unmet Transportation Needs (01/23/2023)  Utilities: Not At Risk (01/23/2023)  Alcohol Screen: Medium Risk (10/27/2017)  Depression (PHQ2-9): High Risk (12/18/2022)  Financial Resource Strain: High Risk (07/15/2022)  Physical Activity: Inactive (07/15/2022)  Social Connections: Socially Isolated (07/15/2022)  Stress: Stress Concern Present (07/15/2022)  Tobacco Use: High Risk (01/23/2023)   SDOH Interventions:     Readmission Risk Interventions     No data to display

## 2023-01-25 NOTE — Progress Notes (Signed)
Patient stated to safety sitter that when he leaves here it will all be over. Safety sitter asked patient what was he talking about and patient replied, "when I leave here I am going to go to the woods and shoot myself".

## 2023-01-25 NOTE — Progress Notes (Addendum)
RN took care of this pt two nights in a row. Pt's pain got ease with PRN pain medications in the first night (2/16-17). But tonight, pt was irritated, agitated and complained of severe pain in right hand despite taking pain medication. Pt expressed that "nobody cares about me and the pain is 100x worse than yesterday". Notified attending On-call for concern. Attending wanted RN to check cap refill and pulse, which were less than 3 sec and moderate pulse. Pt said that R thumb is numb and R hand is very sensitive to touch and very painful. Encourage pt to elevate R arm and use ice pack, but pt said that nothing helped pt's R arm pain. Pt took PRN meds for pain and agitation. Will continue to monitor.
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
## 2023-01-25 NOTE — Progress Notes (Signed)
Physical exam of the right hand shows some swelling and bruising present particularly over the dorsal aspect of the hand and dorsal forearm the compartments are full however compressible there is no pain with passive stretch or range of motion of the digits he can make a fist however somewhat limited due to the swelling he sensation is intact to the tips of the digits his right thumb has some tingling at the very tip.  He has brisk capi llary refill to tips of all digits he has no pain with passive range of motion of the digits nor wrist until get into the very extremes of flexion and extension.  The patient had a minor injury to his right hand where he bumped it on a cabinet this is caused swelling he does have a clotting disorder which they believe is likely from his cirrhosis he also had a spontaneous left leg hematoma this is improving his right hand swelling  his severe however his INR still remains elevated and his platelets are low.  The concern would be for surgical intervention is making the condition worse as he is not able to clot currently appropriately.  My recommendation is for strict elevation massage of the digits dorsal hand and wrist and OT consultation for an edema glove and to help him work on range of motion of the digits and hand and massage to help get the swelling down.   We need medical management for reversal of his clotting deficiency and close observation.  Currently the patient does not have a compartment syndrome based off examination however this could be continued to involve her for unable to get some the the coagulopathy fixed he may be candidate for evacuation of the hematoma surgically however is not medically ready for this.  His he has no systemic symptoms in terms of subjective fevers or ch ills and there is no erythema or drainage just some bruising and evidence of hematoma.  Will continue to follow this closely.

## 2023-01-26 LAB — BPAM FFP
Blood Product Expiration Date: 202402232359
Blood Product Expiration Date: 202402232359
ISSUE DATE / TIME: 202402181306
ISSUE DATE / TIME: 202402181523
Unit Type and Rh: 5100
Unit Type and Rh: 5100

## 2023-01-26 LAB — COMPREHENSIVE METABOLIC PANEL
ALT: 23 U/L (ref 0–44)
AST: 58 U/L — ABNORMAL HIGH (ref 15–41)
Albumin: 2.4 g/dL — ABNORMAL LOW (ref 3.5–5.0)
Alkaline Phosphatase: 152 U/L — ABNORMAL HIGH (ref 38–126)
Anion gap: 7 (ref 5–15)
BUN: 20 mg/dL (ref 6–20)
CO2: 31 mmol/L (ref 22–32)
Calcium: 8.9 mg/dL (ref 8.9–10.3)
Chloride: 99 mmol/L (ref 98–111)
Creatinine, Ser: 1.08 mg/dL (ref 0.61–1.24)
GFR, Estimated: >60 mL/min (ref >60)
Glucose, Bld: 105 mg/dL — ABNORMAL HIGH (ref 70–99)
Potassium: 4.5 mmol/L (ref 3.5–5.1)
Sodium: 137 mmol/L (ref 135–145)
Total Bilirubin: 5.6 mg/dL — ABNORMAL HIGH (ref 0.3–1.2)
Total Protein: 6.5 g/dL (ref 6.5–8.1)

## 2023-01-26 LAB — PREPARE FRESH FROZEN PLASMA

## 2023-01-26 LAB — PROTIME-INR
INR: 1.7 — ABNORMAL HIGH (ref 0.8–1.2)
Prothrombin Time: 19.6 seconds — ABNORMAL HIGH (ref 11.4–15.2)

## 2023-01-26 MED ORDER — ONDANSETRON HCL 4 MG/2ML IJ SOLN
2.0000 mg | Freq: Once | INTRAMUSCULAR | Status: AC
  Administered 2023-01-26: 2 mg via INTRAVENOUS
  Filled 2023-01-26: qty 2

## 2023-01-26 MED ORDER — VITAMIN K1 10 MG/ML IJ SOLN
10.0000 mg | Freq: Once | INTRAVENOUS | Status: AC
  Administered 2023-01-26: 10 mg via INTRAVENOUS
  Filled 2023-01-26: qty 1

## 2023-01-26 MED ORDER — VITAMIN K1 10 MG/ML IJ SOLN
10.0000 mg | Freq: Every day | INTRAVENOUS | Status: DC
  Administered 2023-01-27 – 2023-01-29 (×3): 10 mg via INTRAVENOUS
  Filled 2023-01-26 (×5): qty 1

## 2023-01-26 NOTE — Progress Notes (Signed)
PT Cancellation Note  Patient Details Name: Nathaniel Warren MRN: [id number] DOB: 1965-12-15   Cancelled Treatment:    Reason Eval/Treat Not Completed: Other (comment) Pt sleeping and sitter reports he did not sleep last night.  Will check back as schedule permits.   [name] 01/26/2023, 12:53 PM Jannette Spanner PT, DPT Physical Therapist Acute Rehabilitation Services Preferred contact method: Secure Chat Weekend Pager Only: [phone] Office: [phone]

## 2023-01-26 NOTE — Consult Note (Signed)
[facility] Face-to-Face Psychiatry Consult    Patient Identification: Nathaniel Warren MRN:  [id number] Principal Diagnosis: Spontaneous hematoma of lower leg Diagnosis:  Principal Problem:   Spontaneous hematoma of lower leg Active Problems:   Hyponatremia   Tobacco abuse   Alcoholic cirrhosis of liver with ascites ([place])   Suicidal ideations   Total Time spent with patient: 20 minutes  Subjective:   Nathaniel Warren is a 57 year old male with right hand injury, thigh hematoma.  he has a psychiatric h/o bipolar d/o vs mdd, gad, ptsd, alcohol use disorder.    GURKARAN SCHUCH is a 57 y.o. male patient admitted with .hx for polysubstance abuse, ETOH use, nerve compression L5-S1, MDD, suicide attempt x 4-who endorses suicidal ideations, and intent.  He is guarded and does not disclose the plan.  He is triggered by his chronic nerve pain, financial losses and medical comorbidities. Patient is disgruntled and frustrated throughout the majority of the evaluation. He ruminates and perseverates about multiple psychosocial stressors with a large contribution being finances, lack of access, and underlying mood disorder.  Patient with feelings of hopelessness and despair, impaired judgement and lack of insight presents an acutely high safety risk and continues to be appropriate for inpatient psychiatric admission where he can be monitored for safety and stabilized on medications. He meets criteria for IVC which will be upheld at this time.   During face to face assessment today, patient was casually dressed, lying in bed with sitter at bedside. He was cooperative during interview, alert and oriented x 4, his speech was clear, normal rate, but circumstantial sometimes, he was  redirectable, and eye contact was fair.  Patient was difficult to keep awake, and would occasionally fall asleep and then awake and began to answer questions again. He contributed this to his mental exhaustion vs narcotic pain medication. It  should be noted that he received IV Morphine at 754am and Oxycodone 5-399m po at 554am, followed by zofran 264mIV at 060. When assessing for depression, he endorses sad mood, and affect was congruent. He states that his sleep is "sporadic", as mentioned he continue to doze off and on during the interview. He endorses low energy, feeling of hopelessness and worthlessness, states that " I can't pay my bills, can't get around and has no accomodation". He was rocking back and forth during the interview, He also endorses suicidal ideation, though he denies having a  plan, but states that " I will kill myself if I leave here". He denies auditory nor visual hallucination . He declines to restart previous outpatient psych meds - zoloft, gabapentin, trazodone, and Seroquel, because he states that he can't afford it when discharged. He is unable to contract for safety if discharged. He does not agree with inpatient psych admission at this time, however he remains under IVC as he continues to meet criteria. Will continue to reassess daily.     Past Psychiatric History: bipolar d/o vs mdd, gad, ptsd, alcohol use disorder.   Risk to Self:  yes  Risk to Others:  impulsive but denying HI  Prior Inpatient Therapy:   Prior Outpatient Therapy:    Past Medical History:  Past Medical History:  Diagnosis Date   Depression    Hernia, abdominal    [place] ([facility]   [facility]   [facility]    Past Surgical History:  Procedure Laterality Date   ANKLE SURGERY     ANKLE SURGERY Right  HERNIA REPAIR     SPLENECTOMY     Family History:  Family History  Problem Relation Age of Onset   COPD Mother    Diabetes Father    Stroke Maternal Grandmother    Alzheimer's disease Maternal Grandfather    Cancer Paternal Grandmother    Stomach cancer Paternal Grandfather    Hypertension Other    Colon cancer Neg Hx    Esophageal cancer Neg Hx    Colon polyps Neg Hx    Inflammatory bowel disease Neg Hx     Liver disease Neg Hx    Pancreatic cancer Neg Hx    Rectal cancer Neg Hx    Family Psychiatric  History: did not report   Social History:  Social History   Substance and Sexual Activity  Alcohol Use Not Currently     Social History   Substance and Sexual Activity  Drug Use Yes   Types: Marijuana    Social History   Socioeconomic History   Marital status: Single    Spouse name: Not on file   Number of children: 2   Years of education: 14   Highest education level: Not on file  Occupational History   Occupation: Nature conservation officer  Tobacco Use   Smoking status: Every Day    Packs/day: 1.00    Years: 30.00    Total pack years: 30.00    Types: Cigarettes   Smokeless tobacco: Former  Scientific laboratory technician Use: Never used  Substance and Sexual Activity   Alcohol use: Not Currently   Drug use: Yes    Types: Marijuana   Sexual activity: Yes    Comment:  wife had hysterectomy  Other Topics Concern   Not on file  Social History Narrative   ** Merged History Encounter **    Right handed   Unable to work   Lives with roommate and cousin   Drinks caffeine   One floor house   Social Determinants of Health   Financial Resource Strain: High Risk (07/15/2022)   Overall Financial Resource Strain (CARDIA)    Difficulty of Paying Living Expenses: Very hard  Food Insecurity: Food Insecurity Present (01/23/2023)   Hunger Vital Sign    Worried About Running Out of Food in the Last Year: Sometimes true    Ran Out of Food in the Last Year: Sometimes true  Transportation Needs: Unmet Transportation Needs (01/23/2023)   PRAPARE - Transportation    Lack of Transportation (Medical): Yes    Lack of Transportation (Non-Medical): Yes  Physical Activity: Inactive (07/15/2022)   Exercise Vital Sign    Days of Exercise per Week: 0 days    Minutes of Exercise per Session: 0 min  Stress: Stress Concern Present (07/15/2022)   [place]    Feeling of Stress : Very much  Social Connections: Socially Isolated (07/15/2022)   Social Connection and Isolation Panel [NHANES]    Frequency of Communication with Friends and Family: More than three times a week    Frequency of Social Gatherings with Friends and Family: Once a week    Attends Religious Services: Never    Marine scientist or Organizations: No    Attends Archivist Meetings: Never    Marital Status: Divorced   Additional Social History:    Allergies:  No Known Allergies  Labs:  Results for orders placed or performed during the hospital encounter of 01/22/23 (from the past 48  hour(s))  Protime-INR     Status: Abnormal   Collection Time: 01/25/23  3:29 AM  Result Value Ref Range   Prothrombin Time 21.6 (H) 11.4 - 15.2 seconds   INR 1.9 (H) 0.8 - 1.2    Comment: (NOTE) INR goal varies based on device and disease states. Performed at [facility], [place] [address] [place], [place] [address]   CBC with Differential/Platelet     Status: Abnormal   Collection Time: 01/25/23  3:29 AM  Result Value Ref Range   WBC 10.2 4.0 - 10.5 K/uL   RBC 2.75 (L) 4.22 - 5.81 MIL/uL   Hemoglobin 10.1 (L) 13.0 - 17.0 g/dL   HCT 28.2 (L) 39.0 - 52.0 %   MCV 102.5 (H) 80.0 - 100.0 fL   MCH 36.7 (H) 26.0 - 34.0 pg   MCHC 35.8 30.0 - 36.0 g/dL   RDW 18.4 (H) 11.5 - 15.5 %   Platelets 83 (L) 150 - 400 K/uL    Comment: SPECIMEN CHECKED FOR CLOTS Immature Platelet Fraction may be clinically indicated, consider ordering this additional test [id number] REPEATED TO VERIFY PLATELET COUNT CONFIRMED BY SMEAR    nRBC 0.0 0.0 - 0.2 %   Neutrophils Relative % 48 %   Neutro Abs 4.9 1.7 - 7.7 K/uL   Lymphocytes Relative 33 %   Lymphs Abs 3.4 0.7 - 4.0 K/uL   Monocytes Relative 12 %   Monocytes Absolute 1.3 (H) 0.1 - 1.0 K/uL   Eosinophils Relative 6 %   Eosinophils Absolute 0.6 (H) 0.0 - 0.5 K/uL   Basophils Relative 1 %   Basophils  Absolute 0.1 0.0 - 0.1 K/uL   Immature Granulocytes 0 %   Abs Immature Granulocytes 0.04 0.00 - 0.07 K/uL    Comment: Performed at [facility], [place] [address] [place], [place] [address]  Prepare fresh frozen plasma     Status: None   Collection Time: 01/25/23 11:53 AM  Result Value Ref Range   Unit Number [id number]    Blood Component Type THW PLS APHR    Unit division B0    Status of Unit ISSUED,FINAL    Transfusion Status      OK TO TRANSFUSE Performed at [facility] [address], [place], [place] [address]    Unit Number [id number]    Blood Component Type THW PLS APHR    Unit division B0    Status of Unit ISSUED,FINAL    Transfusion Status OK TO TRANSFUSE   Protime-INR     Status: Abnormal   Collection Time: 01/26/23  3:35 AM  Result Value Ref Range   Prothrombin Time 19.6 (H) 11.4 - 15.2 seconds   INR 1.7 (H) 0.8 - 1.2    Comment: (NOTE) INR goal varies based on device and disease states. Performed at [facility], [place] [address], [place], [place] [address]   Comprehensive metabolic panel     Status: Abnormal   Collection Time: 01/26/23  3:35 AM  Result Value Ref Range   Sodium 137 135 - 145 mmol/L   Potassium 4.5 3.5 - 5.1 mmol/L   Chloride 99 98 - 111 mmol/L   CO2 31 22 - 32 mmol/L   Glucose, Bld 105 (H) 70 - 99 mg/dL    Comment: Glucose reference range applies only to samples taken after fasting for at least 8 hours.   BUN 20 6 - 20 mg/dL   Creatinine, Ser 1.08 0.61 - 1.24  mg/dL   Calcium 8.9 8.9 - 10.3 mg/dL   Total Protein 6.5 6.5 - 8.1 g/dL   Albumin 2.4 (L) 3.5 - 5.0 g/dL   AST 58 (H) 15 - 41 U/L   ALT 23 0 - 44 U/L   Alkaline Phosphatase 152 (H) 38 - 126 U/L   Total Bilirubin 5.6 (H) 0.3 - 1.2 mg/dL   GFR, Estimated >60 >60 mL/min    Comment: (NOTE) Calculated using the CKD-EPI Creatinine Equation (2021)    Anion gap 7 5 - 15    Comment: Performed at [facility],  [place] [address] [place], [place] [address]    Current Facility-Administered Medications  Medication Dose Route Frequency Provider Last Rate Last Admin   0.9 %  sodium chloride infusion  250 mL Intravenous PRN Toy Baker, MD       folic acid (FOLVITE) tablet 1 mg  1 mg Oral Daily Doutova, Anastassia, MD   1 mg at 01/26/23 [id number]   furosemide (LASIX) tablet 40 mg  40 mg Oral Daily Toy Baker, MD   40 mg at 01/26/23 [id number]   morphine (PF) 2 MG/ML injection 2 mg  2 mg Intravenous Q3H PRN Toy Baker, MD   2 mg at 01/26/23 0739   oxyCODONE-acetaminophen (PERCOCET/ROXICET) 5-325 MG per tablet 2 tablet  2 tablet Oral Q4H PRN Nita Sells, MD   2 tablet at 01/26/23 0557   risperiDONE (RISPERDAL M-TABS) disintegrating tablet 2 mg  2 mg Oral Q8H PRN Suella Broad, FNP       And   ziprasidone (GEODON) injection 20 mg  20 mg Intramuscular PRN Starkes-Perry, Gayland Curry, FNP       sodium chloride flush (NS) 0.9 % injection 3 mL  3 mL Intravenous Q12H Doutova, Anastassia, MD   3 mL at 01/26/23 [id number]   sodium chloride flush (NS) 0.9 % injection 3 mL  3 mL Intravenous PRN Doutova, Nyoka Lint, MD       spironolactone (ALDACTONE) tablet 50 mg  50 mg Oral Daily Doutova, Anastassia, MD   50 mg at 01/26/23 [id number]   thiamine (VITAMIN B1) tablet 100 mg  100 mg Oral Daily Toy Baker, MD   100 mg at 01/26/23 [id number]    Musculoskeletal: Strength & Muscle Tone: Laying in bed   Gait & Station: Laying in bed   Patient leans: Laying in bed     Psychiatric Specialty Exam:  Presentation  General Appearance:  Casual  Eye Contact: Fair  Speech: Normal Rate  Speech Volume: Normal  Handedness: Right   Mood and Affect  Mood: Irritable; Depressed  Affect: Full Range   Thought Process  Thought Processes: Linear  Descriptions of Associations:Intact  Orientation:Full (Time, Place and Person)  Thought Content: Suicidal History of  Schizophrenia/Schizoaffective disorder:No  Duration of Psychotic Symptoms:No data recorded Hallucinations: denies Ideas of Reference: denies Suicidal Thoughts: Yes, denies plan  Homicidal Thoughts: denies   Sensorium  Memory: Immediate Good; Recent Good; Remote Good  Judgment: Impaired  Insight: Lacking   Executive Functions  Concentration: Fair  Attention Span: Fair  Recall: Good  Fund of Knowledge: Good  Language: Fair   Psychomotor Activity  Psychomotor Activity: No data recorded restless, rocking back and forth   Assets  Assets: Communication Skills; Desire for Improvement; Physical Health; Leisure Time; Resilience   Sleep  Sleep: Fair No data recorded   Physical Exam: Physical Exam Vitals reviewed.  HENT:     Head: Normocephalic and atraumatic.  Skin:  Coloration: Skin is jaundiced.  Neurological:     General: No focal deficit present.     Mental Status: He is alert and oriented to person, place, and time.  Psychiatric:        Attention and Perception: He does not perceive auditory or visual hallucinations.        Mood and Affect: Mood is depressed.        Speech: Speech normal.        Behavior: Behavior is cooperative.        Thought Content: Thought content is not paranoid. Thought content includes suicidal ideation.        Judgment: Judgment is inappropriate. Judgment is not impulsive.    Review of Systems  Constitutional: Negative.   Psychiatric/Behavioral:  Positive for depression and suicidal ideas. The patient is nervous/anxious.    Blood pressure (!) 96/59, pulse 61, temperature 98.3 F (36.8 C), temperature source Oral, resp. rate 16, height 5' 9"$  (1.753 m), weight 77.7 kg, SpO2 97 %. Body mass index is 25.3 kg/m.  Treatment Plan Summary:  Assessment: -MDD vs Bipoalr disorde r- pt refusing any scheduled psych meds -GAD -PTSD -Hx of Alcohol use disorder  Plan: - Unable to contract for safety, will continue 1-1  sitter -Continue ivc - Continue recommendation for inpatient psych admission when medically cleared -He declines starting any scheduled psychiatric medications -consult SW to talk with patient, he would like some direction/assistance in regards to accessing social services  -Continue collaborating care with orthopedic for his hand, surgery remains unscheduled at this time due to INR being elevated. They will continue to observe for another 24 hours.  Psychiatry will continue to follow.   Disposition: Recommend psychiatric Inpatient admission when medically cleared. Supportive therapy provided about ongoing stressors.  Suella Broad, FNP 01/26/2023 1:43 PM

## 2023-01-26 NOTE — Evaluation (Addendum)
Occupational Therapy Evaluation Patient Details Name: Nathaniel Warren MRN: [id number] DOB: 01-17-1966 Today's Date: 01/26/2023   History of Present Illness 57 yo male with onset of grion and RT dorsal hand bruising and edema with reported light trauma during a fall.  Has h/o R foot drop without AFO.  PMHx:  etoh abuse, depression with h/o 4 suicide attempts, stroke, seizures, liver failure, R ankle fracture with ORIF, atherosclerosis, cirrhosis, diverticulosis, osteopenia   Clinical Impression   Patient is currently requiring assistance with ADLs including up to maximum assist with Lower body ADLs, overall minimal assist with seated Upper body ADLs,  as well as  Min guard assist with standing from EOB to RW but unable to grasp RW with RT hand.  Current level of function is below patient's typical baseline which pt reports 3was overall independent to Mod I with WC in community and furniture cruising in his home but with multiple past falls.  During this evaluation, patient was limited by generalized weakness, impaired activity tolerance, and pain to RT hand, RT ankle/knee and groin, all of which has the potential to impact patient's safety and independence during functional mobility, as well as performance for ADLs.  Patient lives with his cousin, who is unable to provide 24/7 supervision and assistance and pt reports that he cannot financially contribute to groceries or bills which is putting  a strain on cousin's resources.  Patient demonstrates fair rehab potential, and should benefit from continued skilled occupational therapy services while in acute care to maximize safety, independence and quality of life at home.  Continued occupational therapy services in a SNF setting prior to return home is recommended.  ?       Recommendations for follow up therapy are one component of a multi-disciplinary discharge planning process, led by the attending physician.  Recommendations may be updated based on  patient status, additional functional criteria and insurance authorization.   Follow Up Recommendations  Skilled nursing-short term rehab (<3 hours/day)     Assistance Recommended at Discharge Frequent or constant Supervision/Assistance  Patient can return home with the following A little help with bathing/dressing/bathroom;A little help with walking and/or transfers;Assistance with cooking/housework;Assist for transportation;Direct supervision/assist for medications management    Functional Status Assessment  Patient has had a recent decline in their functional status and demonstrates the ability to make significant improvements in function in a reasonable and predictable amount of time.  Equipment Recommendations  None recommended by OT    Recommendations for Other Services PT consult     Precautions / Restrictions Precautions Precautions: Fall Precaution Comments: Suicide precautions, 1:1 sitter Restrictions Weight Bearing Restrictions: No      Mobility Bed Mobility Overal bed mobility: Modified Independent                  Transfers Overall transfer level: Needs assistance Equipment used: Rolling walker (2 wheels) Transfers: Sit to/from Stand Sit to Stand: Min guard                  Balance Overall balance assessment: History of Falls, Needs assistance Sitting-balance support: No upper extremity supported, Feet supported Sitting balance-Leahy Scale: Good     Standing balance support: During functional activity, Reliant on assistive device for balance Standing balance-Leahy Scale: Poor                             ADL either performed or assessed with clinical judgement   ADL Overall  ADL's : Needs assistance/impaired Eating/Feeding: Set up;Supervision/ safety;Sitting   Grooming: Supervision/safety;Set up;Sitting   Upper Body Bathing: Minimal assistance;Sitting   Lower Body Bathing: Minimal assistance;Sitting/lateral leans;Sit to/from  stand   Upper Body Dressing : Minimal assistance   Lower Body Dressing: Maximal assistance;Sitting/lateral leans Lower Body Dressing Details (indicate cue type and reason): To don socks. Toilet Transfer: Agricultural engineer (2 wheels) Toilet Transfer Details (indicate cue type and reason): Pt stood from EOB with Min guard and RT side of RW held to floor as pt only able to grasp with L hand and rested RT elbow on handle of RW. Toileting- Clothing Manipulation and Hygiene: Minimal assistance       Functional mobility during ADLs: Min guard;Cueing for safety;Rolling walker (2 wheels)       Vision Baseline Vision/History: 1 Wears glasses Patient Visual Report: No change from baseline       Perception     Praxis      Pertinent Vitals/Pain Pain Assessment Pain Assessment: 0-10 Pain Score: 8  Pain Location: RT ankle, RT hand, grion. Pain Descriptors / Indicators: Tender, Tightness, Throbbing Pain Intervention(s): Limited activity within patient's tolerance, Monitored during session, Repositioned, Ice applied     Hand Dominance Right   Extremity/Trunk Assessment Upper Extremity Assessment Upper Extremity Assessment: RUE deficits/detail RUE Deficits / Details: Very limited finger flexion due to pain/swelling, but improved with repition during exercises. Extension WFL. Shoulder to elbow WFL with ROM. Able to place forearm on head for edema magement exercises. RUE: Unable to fully assess due to pain RUE Sensation: decreased light touch (Diminished to distal thumb. Otherwise hypersensitive to touch.) RUE Coordination: decreased fine motor           Communication Communication Communication: No difficulties   Cognition Arousal/Alertness: Awake/alert Behavior During Therapy: Impulsive Overall Cognitive Status: No family/caregiver present to determine baseline cognitive functioning                                 General Comments: Manic with pressured  speech, poor attention to tasks and topics, very easily distracted with need of frequent redirection. Stated, "I don't know why we're even making plans" suggesting continued suicidal ideation.     General Comments       Exercises  Pt sat EOB with RT hand supported on pad. Completed very slow exercises to tolerance including finger flexion/ext, finger abduction/adduction and MP hyperextension to ~5-7 reps each with pt often stopping or losing track of task and requiring very frequent redirection. Difficulty with verbal and visual cues and does not tolerate tactile cues due to pain.  Handout provided with all exercises and pt instructed to perform 10 reps 3x/day as tolerated. Instructed to try submaximal movements to warm up for pain management.   Pt followed cues and placed RT forearm on head and perform hand pumps to tolerance for ~30 seconds.    Shoulder Instructions      Home Living Family/patient expects to be discharged to:: Private residence Living Arrangements: Other relatives (Cousin) Available Help at Discharge: Family;Available PRN/intermittently Type of Home: House Home Access: Stairs to enter [employer] of Steps: 2 Entrance Stairs-Rails: Left Home Layout: One level     Bathroom Shower/Tub: Teacher, early years/pre: Standard     Home Equipment: Conservation officer, nature (2 wheels);Crutches;Wheelchair - manual;Grab bars - tub/shower;Grab bars - toilet          Prior Functioning/Environment Prior Level of Function :  History of Falls (last six months);Patient poor historian/Family not available             Mobility Comments: Uses WC in community. Could propell until hand injury. Furniture cruises at home. Endorses multiple falls and additional near-miss stumbles. ADLs Comments: Was indepdendent until injury        OT Problem List: Impaired UE functional use;Impaired sensation;Increased edema;Decreased cognition;Decreased range of motion;Decreased  strength;Decreased coordination;Pain;Decreased safety awareness;Impaired balance (sitting and/or standing);Decreased knowledge of precautions      OT Treatment/Interventions: Self-care/ADL training;Therapeutic exercise;Therapeutic activities;Cognitive remediation/compensation;DME and/or AE instruction;Patient/family education;Manual therapy;Balance training    OT Goals(Current goals can be found in the care plan section) Acute Rehab OT Goals Patient Stated Goal: "Just don't touch me. Let me do this myself." OT Goal Formulation: With patient Time For Goal Achievement: 02/09/23 Potential to Achieve Goals: Fair ADL Goals Pt Will Perform Lower Body Dressing: with modified independence;sitting/lateral leans;sit to/from stand Pt Will Transfer to Toilet: with modified independence;ambulating Pt Will Perform Toileting - Clothing Manipulation and hygiene: with modified independence Pt/caregiver will Perform Home Exercise Program: Increased ROM;With Supervision;Right Upper extremity (Per handouts in room) Additional ADL Goal #1: Pt will independently demonstrate edema management and desensitization techniques including elevation of RUE above heart at rest, rubbing/patting tender areas regularly as tolerated, resting forearm on head for hand pumps throughout the day without need of reminder cues.  OT Frequency: Min 3X/week    Co-evaluation              AM-PAC OT "6 Clicks" Daily Activity     Outcome Measure Help from another person eating meals?: A Little Help from another person taking care of personal grooming?: A Little Help from another person toileting, which includes using toliet, bedpan, or urinal?: A Little Help from another person bathing (including washing, rinsing, drying)?: A Little Help from another person to put on and taking off regular upper body clothing?: A Little Help from another person to put on and taking off regular lower body clothing?: A Lot 6 Click Score: 17   End of  Session Equipment Utilized During Treatment: Rolling walker (2 wheels) Nurse Communication: Other (comment) (Ace wrap to RT hand for gentle compression as pt tolerated. Look for any increased edema to fingers or increased pain.)  Activity Tolerance: Patient limited by pain Patient left: in bed;with nursing/sitter in room  OT Visit Diagnosis: Pain;History of falling (Z91.81);Repeated falls (R29.6);Muscle weakness (generalized) (M62.81);Unsteadiness on feet (R26.81) Pain - Right/Left: Right Pain - part of body: Ankle and joints of foot;Hand                Time: [id number] OT Time Calculation (min): 41 min Charges:  OT General Charges $OT Visit: 1 Visit OT Evaluation $OT Eval Moderate Complexity: 1 Mod OT Treatments $Therapeutic Activity: 8-22 mins $Therapeutic Exercise: 8-22 mins  Anderson Malta, OT Acute Rehab Services Office: [phone] 01/26/2023   Julien Girt 01/26/2023, 11:02 AM

## 2023-01-26 NOTE — Progress Notes (Signed)
PT Cancellation Note  Patient Details Name: Nathaniel Warren MRN: [id number] DOB: 14-Mar-1966   Cancelled Treatment:    Reason Eval/Treat Not Completed: Other (comment) Pt eating lunch and declined to participate until finished eating.  Also checked back on pt after 25 minutes and pt still eating (had 2 trays).     [name] 01/26/2023, 3:15 PM Arlyce Dice, DPT Physical Therapist Acute Rehabilitation Services Preferred contact method: Secure Chat Weekend Pager Only: [phone] Office: [phone]

## 2023-01-26 NOTE — Progress Notes (Signed)
Continued pain and swelling over the dorsal aspect of the right hand.  The exam is unchanged from yesterday.  His compartments are full however compressible through short arc of motion actively and passively to fingers do move with no pain with passive stretch of the digits.  Sensory exam is same as yesterday as well with some numbness in the very tip of the right thumb however intact sensation to light touch to all of the digits.  Bris k cap refill to the tips of the fingers with palpable radial artery pulse.  Overall patient is comfortable and conversive.  He does have pain in the right hand however this is not consistently progressing and has stabilized.  Is coagulopathy is being corrected and INR down to 1.7.  He is still out of the range of what I feel as though would be a safe surgical range and his exam has not been worsening thus we will continue with con servative measures with elevation and massage of the digits edema glove and close observation.  Will give another 24 hours to see how things go.  Okay for diet today.  N.p.o. at midnight.  Continue with correcting his coagulopathy medically.
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
## 2023-01-27 LAB — COMPREHENSIVE METABOLIC PANEL
ALT: 24 U/L (ref 0–44)
AST: 62 U/L — ABNORMAL HIGH (ref 15–41)
Albumin: 2.3 g/dL — ABNORMAL LOW (ref 3.5–5.0)
Alkaline Phosphatase: 143 U/L — ABNORMAL HIGH (ref 38–126)
Anion gap: 8 (ref 5–15)
BUN: 16 mg/dL (ref 6–20)
CO2: 28 mmol/L (ref 22–32)
Calcium: 8.7 mg/dL — ABNORMAL LOW (ref 8.9–10.3)
Chloride: 99 mmol/L (ref 98–111)
Creatinine, Ser: 1.11 mg/dL (ref 0.61–1.24)
GFR, Estimated: >60 mL/min (ref >60)
Glucose, Bld: 95 mg/dL (ref 70–99)
Potassium: 4.1 mmol/L (ref 3.5–5.1)
Sodium: 135 mmol/L (ref 135–145)
Total Bilirubin: 6 mg/dL — ABNORMAL HIGH (ref 0.3–1.2)
Total Protein: 6.4 g/dL — ABNORMAL LOW (ref 6.5–8.1)

## 2023-01-27 LAB — PROTIME-INR
INR: 1.8 — ABNORMAL HIGH (ref 0.8–1.2)
Prothrombin Time: 20.4 seconds — ABNORMAL HIGH (ref 11.4–15.2)

## 2023-01-27 NOTE — Progress Notes (Signed)
Physical exam of the right hand shows dramatic improvement in terms of range of motion swelling.  It is still quite swollen however has improved.  Yesterday he is able to achieve about a half of a composite fist he has some pain over the dorsal aspect where there is swelling however no pain with passive stretch of the digits is able to gently flex and extend the wrist.  He has brisk cap refill tips of all fingers of the tip of the thumb  was tingling otherwise the other digits have intact sensation to light touch.  Patient's main concern today was severe pain from hunger as he has not had breakfast which we discussed was in case he needed surgery.  At this time no indication for surgery this hematoma will slowly resorb over time I think the risk-benefit ratio of draining this hematoma is too high as he still coagulopathic and it is improving on clinical exam thus w e will continue with hand therapy gentle compression with an Ace wrap elevation massage and finger range of motion.  Appreciate OT assistance.  At this point no further need for surgical intervention and can have a diet.  Please call me back if any change in his neurovascular exam or fails to continue to improve.  He has steadily improved over the last few days.  Matt Holmes, MD
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
## 2023-01-27 NOTE — Progress Notes (Signed)
PT Cancellation Note  Patient Details Name: MILEN TREECE MRN: [id number] DOB: 1966/11/30   Cancelled Treatment:    Reason Eval/Treat Not Completed: Other (comment) currently eating breakfast, per surgical team note looks like surgery was cancelled due to rapid improvement- will check back later this morning.   Deniece Ree PT DPT PN2

## 2023-01-27 NOTE — Evaluation (Signed)
Physical Therapy Evaluation Patient Details Name: Nathaniel Warren MRN: [id number] DOB: 1966/01/23 Today's Date: 01/27/2023  History of Present Illness  57 yo male with onset of grion and RT dorsal hand bruising and edema with reported light trauma during a fall.  Has h/o R foot drop without AFO. + for suicidal ideations.   PMHx:  etoh abuse, depression with h/o 4 suicide attempts, stroke, seizures, liver failure, R ankle fracture with ORIF, atherosclerosis, cirrhosis, diverticulosis, osteopenia  Clinical Impression   Pt received in bed, pleasant and cooperative with PT but a bit impulsive and tangential today, unsure if this is his baseline cognitive status. Did still express suicidal ideations with PT, RN aware. Able to progress mobility today considerably with platform RW, does demonstrate gait impairments as below. Left up in recliner with safety sitter and daughter present. May benefit from course of rehab prior to plans for behavioral health- there is a possibility that mobility will improve considerably in rehab setting and allow him to more safely attend behavioral health treatment program.        Recommendations for follow up therapy are one component of a multi-disciplinary discharge planning process, led by the attending physician.  Recommendations may be updated based on patient status, additional functional criteria and insurance authorization.  Follow Up Recommendations Acute inpatient rehab (3hours/day)      Assistance Recommended at Discharge Intermittent Supervision/Assistance  Patient can return home with the following  A little help with walking and/or transfers;Direct supervision/assist for medications management;Assistance with cooking/housework;Assist for transportation;A little help with bathing/dressing/bathroom;Help with stairs or ramp for entrance    Equipment Recommendations Other (comment) (R platform RW but may progress away from this as edema in R UE continues to  improve)  Recommendations for Other Services       Functional Status Assessment Patient has had a recent decline in their functional status and demonstrates the ability to make significant improvements in function in a reasonable and predictable amount of time.     Precautions / Restrictions Precautions Precautions: Fall Precaution Comments: Suicide precautions, 1:1 sitter Restrictions Weight Bearing Restrictions: No      Mobility  Bed Mobility Overal bed mobility: Modified Independent             General bed mobility comments: HOB elevated    Transfers Overall transfer level: Needs assistance Equipment used: Right platform walker Transfers: Sit to/from Stand Sit to Stand: Min guard           General transfer comment: Min guard to boost to standing, did turn suddenly and impulsively when standing beside bed with platform RW and needed MinA for balance    Ambulation/Gait Ambulation/Gait assistance: Min guard Gait Distance (Feet): 70 Feet Assistive device: Right platform walker Gait Pattern/deviations: Step-through pattern, Steppage, Narrow base of support Gait velocity: decreased     General Gait Details: limited DF R ankle/foot, steppage pattern with R LE and some mild inversion/eversion stability noted as well  Stairs            Wheelchair Mobility    Modified Rankin (Stroke Patients Only)       Balance Overall balance assessment: History of Falls, Needs assistance Sitting-balance support: No upper extremity supported, Feet supported Sitting balance-Leahy Scale: Good     Standing balance support: During functional activity, Reliant on assistive device for balance Standing balance-Leahy Scale: Poor  Pertinent Vitals/Pain Pain Assessment Pain Assessment: Faces Faces Pain Scale: Hurts little more Pain Location: RT ankle, RT hand, grion. Pain Descriptors / Indicators: Discomfort, Squeezing Pain  Intervention(s): Limited activity within patient's tolerance, Monitored during session, Repositioned    Home Living                          Prior Function                       Hand Dominance        Extremity/Trunk Assessment   Upper Extremity Assessment Upper Extremity Assessment: Defer to OT evaluation    Lower Extremity Assessment Lower Extremity Assessment: Generalized weakness (R foot drop with limited strength, trace activation of ankle dorsiflexors)    Cervical / Trunk Assessment Cervical / Trunk Assessment: Normal  Communication      Cognition Arousal/Alertness: Awake/alert Behavior During Therapy: Impulsive Overall Cognitive Status: Difficult to assess                                 General Comments: in better spirits today (daughter present), but impulsive and tangential today, needed cues to return to topic at hand as well as frequent redirection. At one point did say "oh there are still plenty of things in here to strangle yourself with", RN aware        General Comments      Exercises     Assessment/Plan    PT Assessment Patient needs continued PT services  PT Problem List Decreased strength;Decreased cognition;Decreased knowledge of use of DME;Decreased activity tolerance;Decreased safety awareness;Decreased balance;Decreased mobility;Decreased coordination       PT Treatment Interventions DME instruction;Balance training;Gait training;Neuromuscular re-education;Stair training;Cognitive remediation;Functional mobility training;Patient/family education;Therapeutic activities;Therapeutic exercise    PT Goals (Current goals can be found in the Care Plan section)  Acute Rehab PT Goals Patient Stated Goal: feel better/less edema in UE PT Goal Formulation: With patient/family Time For Goal Achievement: 02/10/23 Potential to Achieve Goals: Good    Frequency Min 4X/week     Co-evaluation               AM-PAC  PT "6 Clicks" Mobility  Outcome Measure Help needed turning from your back to your side while in a flat bed without using bedrails?: None Help needed moving from lying on your back to sitting on the side of a flat bed without using bedrails?: None Help needed moving to and from a bed to a chair (including a wheelchair)?: A Little Help needed standing up from a chair using your arms (e.g., wheelchair or bedside chair)?: A Little Help needed to walk in hospital room?: A Little Help needed climbing 3-5 steps with a railing? : A Lot 6 Click Score: 19    End of Session Equipment Utilized During Treatment: Gait belt Activity Tolerance: Patient tolerated treatment well Patient left: in chair;with call bell/phone within reach;with nursing/sitter in room Nurse Communication: Mobility status;Other (comment) (ongoing + suicidal ideations with PT) PT Visit Diagnosis: Muscle weakness (generalized) (M62.81);Other abnormalities of gait and mobility (R26.89);History of falling (Z91.81);Difficulty in walking, not elsewhere classified (R26.2);Unsteadiness on feet (R26.81)    Time: [id number] PT Time Calculation (min) (ACUTE ONLY): 52 min   Charges:   PT Evaluation $PT Eval Moderate Complexity: 1 Mod (co-tx with OT) PT Treatments $Gait Training: 8-22 mins      Deniece Ree PT DPT PN2

## 2023-01-27 NOTE — Progress Notes (Signed)
  Inpatient Rehab Admissions Coordinator :  Per therapy recommendations patient was screened for CIR candidacy by Danne Baxter RN MSN. I have asked Dr Naaman Plummer to review this case to provide recommendations for rehab needs. Please contact me with any questions.  Danne Baxter RN MSN Admissions Coordinator [phone]   Dr Naaman Plummer reviewed case and feels patient is not a CIR candidate. Chronic foot drop with hand hematoma. Recommend other rehab venues to be pursued.  Danne Baxter, RN, MSN Rehab Admissions Coordinator [phone] 01/28/2023 8:53 AM

## 2023-01-27 NOTE — TOC Progression Note (Signed)
Transition of Care [facility]) - Progression Note   Patient Details  Name: Nathaniel Warren MRN: [id number] Date of Birth: Jun 24, 1966  Transition of [facility]) CM/SW [place], LCSW Phone Number: 01/27/2023, 12:37 PM  Clinical Narrative: CSW followed up with the [facility] and [place], [name], but all units are currently at capacity. As patient will need OT follow up, [facility] and [name] are the only inpatient psych facilities that will be able to take him when medically stable.    Expected Discharge Plan: Psychiatric Hospital Barriers to Discharge: Continued Medical Work up  Expected Discharge Plan and Services In-house Referral: Clinical Social Work Discharge Planning Services: NA Post Acute Care Choice: NA Living arrangements for the past 2 months: Single Family Home           DME Arranged: N/A DME Agency: NA  Social Determinants of Health (SDOH) Interventions [name]: Food Insecurity Present (01/23/2023)  Housing: Medium Risk (01/23/2023)  Transportation Needs: Unmet Transportation Needs (01/23/2023)  Utilities: Not At Risk (01/23/2023)  Alcohol Screen: Medium Risk (10/27/2017)  Depression (PHQ2-9): High Risk (12/18/2022)  Financial Resource Strain: High Risk (07/15/2022)  Physical Activity: Inactive (07/15/2022)  Social Connections: Socially Isolated (07/15/2022)  Stress: Stress Concern Present (07/15/2022)  Tobacco Use: High Risk (01/23/2023)   Readmission Risk Interventions     No data to display

## 2023-01-27 NOTE — Progress Notes (Signed)
Occupational Therapy Treatment Patient Details Name: Nathaniel Warren MRN: [id number] DOB: [birth year] Today's Date: 01/27/2023   History of present illness 57 yo male with onset of grion and RT dorsal hand bruising and edema with reported light trauma during a fall.  Has h/o R foot drop without AFO. + for suicidal ideations.   PMHx:  etoh abuse, depression with h/o 4 suicide attempts, stroke, seizures, liver failure, R ankle fracture with ORIF, atherosclerosis, cirrhosis, diverticulosis, osteopenia   OT comments  Patient progressing and showed improved RT hand ROM and decreased edema at fingers but still too swollen for compression glove. Will continue to monitor this. Pt required visual and verbal cues to remember his exercises and then performed with demonstration. Needs cues to ask for pillows to elevate his RUE and remains cognitively disorganized with difficulty attending to task and topics.  Patient remains limited by continued suicidal ideation which hinders his commitment to therapy, RUE edema, pain and weakness, generalized weakness with RT foot drop, and decreased activity tolerance along with deficits noted below. Pt continues to demonstrate fair rehab potential and would benefit from continued skilled OT to increase safety and independence with ADLs and functional transfers to allow pt to return home safely and reduce caregiver burden and fall risk.    Recommendations for follow up therapy are one component of a multi-disciplinary discharge planning process, led by the attending physician.  Recommendations may be updated based on patient status, additional functional criteria and insurance authorization.    Follow Up Recommendations  Acute inpatient rehab (3hours/day)     Assistance Recommended at Discharge Frequent or constant Supervision/Assistance  Patient can return home with the following  A little help with bathing/dressing/bathroom;A little help with walking and/or  transfers;Assistance with cooking/housework;Assist for transportation;Direct supervision/assist for medications management   Equipment Recommendations  None recommended by OT    Recommendations for Other Services      Precautions / Restrictions Precautions Precautions: Fall Precaution Comments: Suicide precautions, 1:1 sitter Restrictions Weight Bearing Restrictions: No       Mobility Bed Mobility Overal bed mobility: Modified Independent             General bed mobility comments: HOB elevated    Transfers                         Balance Overall balance assessment: History of Falls, Needs assistance Sitting-balance support: No upper extremity supported, Feet supported Sitting balance-Leahy Scale: Good     Standing balance support: During functional activity, Reliant on assistive device for balance Standing balance-Leahy Scale: Poor Standing balance comment: impulsive                           ADL either performed or assessed with clinical judgement   ADL Overall ADL's : Needs assistance/impaired                     Lower Body Dressing: Set up;Minimal assistance;Sitting/lateral leans Lower Body Dressing Details (indicate cue type and reason): Pt able to don his shoes using LT hand with RT spotting as tolerated. Required assistance to tie shoes. Pt and daughter educated on eleastic shoe laces for ease. Toilet Transfer: Min guard;Rolling walker (2 wheels);Cueing for safety;Cueing for sequencing Toilet Transfer Details (indicate cue type and reason): Pt stood to RT platform RW with Min guard to Min As with LOB but pt  able to correct. Impulsive with cues for  slow, controlled movements.         Functional mobility during ADLs: Min guard;Cueing for safety;Rolling walker (2 wheels)      Extremity/Trunk Assessment Upper Extremity Assessment Upper Extremity Assessment: Defer to OT evaluation RUE Deficits / Details: Very limited finger  flexion due to pain/swelling, but improved with repition during exercises. Extension WFL. Shoulder to elbow WFL with ROM. Able to place forearm on head for edema magement exercises. RUE Coordination: decreased fine motor   Lower Extremity Assessment Lower Extremity Assessment: Generalized weakness (R foot drop with limited strength, trace activation of ankle dorsiflexors)   Cervical / Trunk Assessment Cervical / Trunk Assessment: Normal    Vision Patient Visual Report: No change from baseline     Perception     Praxis      Cognition Arousal/Alertness: Awake/alert Behavior During Therapy: Impulsive Overall Cognitive Status: Difficult to assess                                 General Comments: in better spirits today (daughter present), but remains impulsive and tangential today, needed cues to return to topic at hand as well as frequent redirection. At one point did say "oh there are still plenty of things in here to strangle yourself with", RN aware.        Exercises Other Exercises Other Exercises: Pt able to recall 1/3 exercises and 2/3 with cues. Pt instructed again on all hand exercises and edema magement techniques. RUE placed on 2 pillows in recliner. Pt performed 10 reps open/close hand with cues to allow time for full ROM to fingers as able. 10 reps finger add/abd and demonstrated understanding to MP extension with imrpvoed movements and ROM soon in 2/3 exercises. Re-wrapped RUE with ace wrap to c ontinue gentle compression.  Pt also able to demonstrate gentle finger self-traction and "milding" with retrograde massage to each digit which the MD instructed pt in.  Pt with better tolerance to touch to allow for massage.    Shoulder Instructions       General Comments      Pertinent Vitals/ Pain       Pain Assessment Pain Assessment: Faces Faces Pain Scale: Hurts little more Pain Location: RT ankle, RT hand, grion. Pain Descriptors / Indicators:  Discomfort, Squeezing Pain Intervention(s): Limited activity within patient's tolerance, Monitored during session, Repositioned (Platform attached to RW)  Home Living                                          Prior Functioning/Environment              Frequency  Min 3X/week        Progress Toward Goals  OT Goals(current goals can now be found in the care plan section)  Progress towards OT goals: Progressing toward goals  Acute Rehab OT Goals Patient Stated Goal: Pt more fixated on food stamps today. Needs redirection to therapy related tasks. OT Goal Formulation: With patient Time For Goal Achievement: 02/09/23 Potential to Achieve Goals: [place]  Plan Discharge plan needs to be updated    Co-evaluation    PT/OT/SLP Co-Evaluation/Treatment: Yes Reason for Co-Treatment: For patient/therapist safety;To address functional/ADL transfers PT goals addressed during session: Mobility/safety with mobility OT goals addressed during session: ADL's and self-care;Strengthening/ROM      AM-PAC OT "6 Clicks"  Daily Activity     Outcome Measure   Help from another person eating meals?: A Little Help from another person taking care of personal grooming?: A Little Help from another person toileting, which includes using toliet, bedpan, or urinal?: A Little Help from another person bathing (including washing, rinsing, drying)?: A Little Help from another person to put on and taking off regular upper body clothing?: A Little Help from another person to put on and taking off regular lower body clothing?: A Little 6 Click Score: 18    End of Session Equipment Utilized During Treatment: Rolling walker (2 wheels);Gait belt  OT Visit Diagnosis: Pain;History of falling (Z91.81);Repeated falls (R29.6);Muscle weakness (generalized) (M62.81);Unsteadiness on feet (R26.81) Pain - Right/Left: Right Pain - part of body: Ankle and joints of foot;Hand   Activity Tolerance Patient  tolerated treatment well   Patient Left in chair;with call bell/phone within reach;with family/visitor present;with nursing/sitter in room   Nurse Communication Other (comment) (RN aware of OT working with pt)        Time: [id number] OT Time Calculation (min): 34 min  Charges: OT General Charges $OT Visit: 1 Visit OT Treatments $Therapeutic Exercise: 8-22 mins  Anderson [place], [place] Office: [phone] 01/27/2023  Julien Girt 01/27/2023, 1:23 PM

## 2023-01-28 LAB — COMPREHENSIVE METABOLIC PANEL
ALT: 25 U/L (ref 0–44)
AST: 64 U/L — ABNORMAL HIGH (ref 15–41)
Albumin: 2.5 g/dL — ABNORMAL LOW (ref 3.5–5.0)
Alkaline Phosphatase: 185 U/L — ABNORMAL HIGH (ref 38–126)
Anion gap: 6 (ref 5–15)
BUN: 15 mg/dL (ref 6–20)
CO2: 26 mmol/L (ref 22–32)
Calcium: 8.5 mg/dL — ABNORMAL LOW (ref 8.9–10.3)
Chloride: 100 mmol/L (ref 98–111)
Creatinine, Ser: 1.08 mg/dL (ref 0.61–1.24)
GFR, Estimated: >60 mL/min (ref >60)
Glucose, Bld: 105 mg/dL — ABNORMAL HIGH (ref 70–99)
Potassium: 4.1 mmol/L (ref 3.5–5.1)
Sodium: 132 mmol/L — ABNORMAL LOW (ref 135–145)
Total Bilirubin: 6 mg/dL — ABNORMAL HIGH (ref 0.3–1.2)
Total Protein: 6.7 g/dL (ref 6.5–8.1)

## 2023-01-28 LAB — CBC WITH DIFFERENTIAL/PLATELET
Abs Immature Granulocytes: 0.05 10*3/uL (ref 0.00–0.07)
Basophils Absolute: 0.1 10*3/uL (ref 0.0–0.1)
Basophils Relative: 1 %
Eosinophils Absolute: 0.6 10*3/uL — ABNORMAL HIGH (ref 0.0–0.5)
Eosinophils Relative: 6 %
HCT: 27.5 % — ABNORMAL LOW (ref 39.0–52.0)
Hemoglobin: 9.4 g/dL — ABNORMAL LOW (ref 13.0–17.0)
Immature Granulocytes: 1 %
Lymphocytes Relative: 30 %
Lymphs Abs: 3.1 10*3/uL (ref 0.7–4.0)
MCH: 36.4 pg — ABNORMAL HIGH (ref 26.0–34.0)
MCHC: 34.2 g/dL (ref 30.0–36.0)
MCV: 106.6 fL — ABNORMAL HIGH (ref 80.0–100.0)
Monocytes Absolute: 1.6 10*3/uL — ABNORMAL HIGH (ref 0.1–1.0)
Monocytes Relative: 15 %
Neutro Abs: 5.1 10*3/uL (ref 1.7–7.7)
Neutrophils Relative %: 47 %
Platelets: 79 10*3/uL — ABNORMAL LOW (ref 150–400)
RBC: 2.58 MIL/uL — ABNORMAL LOW (ref 4.22–5.81)
RDW: 18.3 % — ABNORMAL HIGH (ref 11.5–15.5)
WBC: 10.5 10*3/uL (ref 4.0–10.5)
nRBC: 0 % (ref 0.0–0.2)

## 2023-01-28 LAB — PROTIME-INR
INR: 1.7 — ABNORMAL HIGH (ref 0.8–1.2)
Prothrombin Time: 19.9 seconds — ABNORMAL HIGH (ref 11.4–15.2)

## 2023-01-28 LAB — VITAMIN B1: Vitamin B1 (Thiamine): 72.3 nmol/L (ref 66.5–200.0)

## 2023-01-28 LAB — FOLATE: Folate: 17 ng/mL (ref >5.9)

## 2023-01-28 MED ORDER — ONDANSETRON HCL 4 MG/2ML IJ SOLN
4.0000 mg | Freq: Four times a day (QID) | INTRAMUSCULAR | Status: DC | PRN
  Administered 2023-01-28 – 2023-01-30 (×3): 4 mg via INTRAVENOUS
  Filled 2023-01-28 (×3): qty 2

## 2023-01-28 MED ORDER — POLYETHYLENE GLYCOL 3350 17 G PO PACK
17.0000 g | PACK | Freq: Every day | ORAL | Status: DC
  Administered 2023-01-28 – 2023-02-20 (×8): 17 g via ORAL
  Filled 2023-01-28 (×22): qty 1

## 2023-01-28 NOTE — Progress Notes (Signed)
PROGRESS NOTE  Nathaniel Warren  [id number] DOB: 06/18/1966 DOA: 01/22/2023 PCP: [name], NP   Brief Narrative: Patient is a 57 year old male with history of alcohol abuse/cirrhosis of liver, depression, chronic right ankle pain, suicidal attempts presented to the emergency department with complaint of left groin swelling, right arm swelling.  Found to have hematoma of the left groin as well as swelling of the right arm consistent with hematoma.  CT hand showed diffuse swelling without any abscess.  Due to suicidal ideation, psychiatry was consulted.  Hand surgery also consulted.  Swelling have improved.  Hematoma stable.  Given few days of vitamin K given this hospitalization for elevated INR.  PT/OT recommending acute inpatient rehab/SNF but looks difficult due to his psychiatric history.  Psychiatry recommending inpatient psychiatric admission.  Medically stable for discharge.  TOC following  Assessment & Plan:  Principal Problem:   Spontaneous hematoma of lower leg Active Problems:   Alcoholic cirrhosis of liver with ascites (HCC)   Hyponatremia   Tobacco abuse   Suicidal ideations  Right arm/left groin swelling consistent with hematoma: Hand surgery was consulted and was following.  Improvement in the range of motion.  No indication for surgery for hematoma and expected slow reabsorption as per hand surgery.  Given few doses of vitamin K, FFP 2 units here.  Still complains of severe right hand Continue elevation, ice application.  Chronic pain syndrome: Has history of L5-S1 nerve compression with sciatica, chronic knee pain with foot drop.  Continue pain management, supportive care.  PT/OT recommending AIR on discharge but looks difficult because of his psychiatric history.  He may need to increase his mobility while inpatient before being discharged to inpatient psychiatry Continue PT/OT therapy while inpatient  Spermatocele: Recommend ultrasound in 3 months  History of  alcohol abuse/liver cirrhosis: Quit in 2023.  Elevated INR.  Given few days of vitamin K.  Has mild elevated liver enzymes along with bilirubin. Recommend to follow-up with GI/hepatology as an outpatient.Follows with [facility].  Has jaundice  Macrocytic anemia/thrombocytopenia: This is secondary to chronic alcohol abuse, cirrhosis. continue thiamine /folic acid.  Hemoglobin stable.  Checking folate, vitamin B12 high  Suicidal attempt/PTSD: Psychiatry following.  Current IVC.  Plan is to discharge to inpatient psychiatry when possible.  Patient currently denies inpatient psychiatric transfer.  Admits that he still has Suicidal thoughts and ideations          DVT prophylaxis:SCDs Start: 01/23/23 0025     Code Status: Full Code  Family Communication: None at the bedside  Patient status:Inpatient  Patient is from :Home  Anticipated discharge to: Inpatient psychiatry  Estimated DC date: Whenever possible   Consultants: Hand surgery, psychiatry  Procedures: None  Antimicrobials:  Anti-infectives (From admission, onward)    None       Subjective: Patient seen and examined at bedside today.  Hemodynamically stable.  Lying in bed.  Complains of severe pain on the right hand, right forearm.  The bedside.  Admits that he is still having suicidal thoughts.  Declines inpatient psychiatric admission but he is IVCed  Objective: Vitals:   01/27/23 2031 01/27/23 2321 01/28/23 0549 01/28/23 0845  BP: (!) 98/52  96/66 (!) 103/55  Pulse: 65  71 73  Resp: 16 16 18 15  $ Temp: 98.7 F (37.1 C)  98.3 F (36.8 C) 98.7 F (37.1 C)  TempSrc: Oral  Oral Oral  SpO2: 95%  94% 97%  Weight:      Height:  Intake/Output Summary (Last 24 hours) at 01/28/2023 1045 Last data filed at 01/28/2023 0900 Gross per 24 hour  Intake 527.08 ml  Output 725 ml  Net -197.92 ml   Filed Weights   01/23/23 2007  Weight: 77.7 kg    Examination:  General exam: Overall comfortable, not in  distress,icteric HEENT: PERRL Respiratory system:  no wheezes or crackles  Cardiovascular system: S1 & S2 heard, RRR.  Gastrointestinal system: Abdomen is nondistended, soft and nontender. Central nervous system: Alert and oriented Extremities: Edema of the right hand, right hand covered with dressing, hematoma/ecchymosis,large hematomatous bulla on the dorsum of right hand, no clubbing ,no cyanosis Skin: No rashes, no ulcers,icteric     Data Reviewed: I have personally reviewed following labs and imaging studies  CBC: Recent Labs  Lab 01/22/23 1928 01/23/23 0034 01/23/23 1343 01/23/23 1859 01/24/23 0102 01/25/23 0329 01/28/23 0352  WBC 8.8   < > 9.2 9.7 9.1 10.2 10.5  NEUTROABS 5.0  --   --   --   --  4.9 5.1  HGB 11.3*   < > 10.2* 10.3* 10.2* 10.1* 9.4*  HCT 31.8*   < > 28.6* 28.5* 27.9* 28.2* 27.5*  MCV 101.3*   < > 100.7* 101.8* 100.0 102.5* 106.6*  PLT 108*   < > 96* 93* 92* 83* 79*   < > = values in this interval not displayed.   Basic Metabolic Panel: Recent Labs  Lab 01/22/23 0046 01/22/23 1928 01/23/23 0455 01/24/23 0102 01/26/23 0335 01/27/23 0400 01/28/23 0352  NA  --    < > 134* 136 137 135 132*  K  --    < > 3.8 4.0 4.5 4.1 4.1  CL  --    < > 98 100 99 99 100  CO2  --    < > 26 26 31 28 26  $ GLUCOSE  --    < > 104* 115* 105* 95 105*  BUN  --    < > 10 12 20 16 15  $ CREATININE  --    < > 0.90 0.97 1.08 1.11 1.08  CALCIUM  --    < > 8.7* 8.4* 8.9 8.7* 8.5*  MG 1.6*  --  1.8  --   --   --   --   PHOS 3.3  --  4.0  --   --   --   --    < > = values in this interval not displayed.     No results found for this or any previous visit (from the past 240 hour(s)).   Radiology Studies: No results found.  Scheduled Meds:  folic acid  1 mg Oral Daily   furosemide  40 mg Oral Daily   polyethylene glycol  17 g Oral Daily   sodium chloride flush  3 mL Intravenous Q12H   spironolactone  50 mg Oral Daily   thiamine  100 mg Oral Daily   Continuous  Infusions:  sodium chloride     phytonadione (VITAMIN K) 10 mg in dextrose 5 % 50 mL IVPB 10 mg (01/27/23 1001)     LOS: 5 days   Shelly Coss, MD [facility] P2/21/2024, 10:45 AM

## 2023-01-28 NOTE — Consult Note (Addendum)
  Psychiatric provider attempted to assess patient today.  On second attempt patient continued to have ongoing vomiting.  Upon entering his room he was noted to be actively vomiting into a blue emesis bag.  There was a large volume of brown contents within the bag.  There was also a pungent odor, that smell like fecal content.  Did advise patient psychiatric consult service will return tomorrow to reassess in which he waved and gave the thumbs up.  Primary team was notified of the patient's ongoing vomiting, and distinct smell.  Psychiatry consult service will continue to follow. -Did reach out to transitions of care team, regarding renewal of patient's upcoming IVC date of 01/29/2023.  Currently awaiting teams response. -There has been some changes to his daily labs to include increase in liver enzymes, and decrease in hemoglobin.  Would likely benefit from repeat lab.

## 2023-01-29 LAB — BASIC METABOLIC PANEL
Anion gap: 9 (ref 5–15)
BUN: 19 mg/dL (ref 6–20)
CO2: 26 mmol/L (ref 22–32)
Calcium: 8.7 mg/dL — ABNORMAL LOW (ref 8.9–10.3)
Chloride: 99 mmol/L (ref 98–111)
Creatinine, Ser: 1.08 mg/dL (ref 0.61–1.24)
GFR, Estimated: >60 mL/min (ref >60)
Glucose, Bld: 119 mg/dL — ABNORMAL HIGH (ref 70–99)
Potassium: 3.8 mmol/L (ref 3.5–5.1)
Sodium: 134 mmol/L — ABNORMAL LOW (ref 135–145)

## 2023-01-29 LAB — CBC
HCT: 25.6 % — ABNORMAL LOW (ref 39.0–52.0)
Hemoglobin: 9 g/dL — ABNORMAL LOW (ref 13.0–17.0)
MCH: 36.6 pg — ABNORMAL HIGH (ref 26.0–34.0)
MCHC: 35.2 g/dL (ref 30.0–36.0)
MCV: 104.1 fL — ABNORMAL HIGH (ref 80.0–100.0)
Platelets: 80 10*3/uL — ABNORMAL LOW (ref 150–400)
RBC: 2.46 MIL/uL — ABNORMAL LOW (ref 4.22–5.81)
RDW: 18 % — ABNORMAL HIGH (ref 11.5–15.5)
WBC: 11.5 10*3/uL — ABNORMAL HIGH (ref 4.0–10.5)
nRBC: 0 % (ref 0.0–0.2)

## 2023-01-29 MED ORDER — PAROXETINE HCL 10 MG PO TABS
10.0000 mg | ORAL_TABLET | Freq: Every day | ORAL | Status: DC
  Administered 2023-01-29 – 2023-01-30 (×2): 10 mg via ORAL
  Filled 2023-01-29 (×2): qty 1

## 2023-01-29 NOTE — Progress Notes (Signed)
Physical exam of the right upper extremity shows dramatic improvement in terms of pain and swelling.  He feels as though it is much better compared to previous days the numbness that was in his right thumb has now resolved.  He is now able to achieve just past half of a fist in terms of flexion and full extension he has no pain with abduction or crossing of fingers sensation intact light touch to the tips of all digits the compartments  of the hand and forearm are compressible.  He has most of the swelling of the subcutaneous tissues of the dorsal right hand with a large bulla on the back.  The forearm swelling is almost completely resolved he is good no pain with passive stretch of the digits and excellent perfusion to the hand with palpable pulses in the wrist.  Comparing day by day the patient has made steady progress in the right direction.  He is quite comfo rtable today he has been diligent about doing his therapy compared to previous days with dramatic improvements the numbness in the tip of the right thumb has now resolved.  We discussed at this time since he is continuing to improve again no indication for surgical intervention my concern would be due to the coagulopathic state if not absolutely needed to avoid making an incision on the hand and I think the bleeding would be an is sue and we could potentially make this the problem worse.  He completely agrees and would like to avoid surgery if possible.  We did discuss that he is at risk for some skin necrosis over the dorsal aspect of the right hand that could potentially require skin graft due to the severe tension on the skin however this does appear to be a subcutaneous issue and no issues within the compartment themselves based on clinical exam.  He agrees w ith this and understands that this large bulla on the dorsal aspect of his right hand may rupture and he could have some necrosis of the skin over the dorsal aspect potentially requiring skin  graft however after discussing the risks benefits and alternatives we have elected to continue with observation therapy elevation and compression techniques rather than surgical intervention.  Continue with the OT finger range of motion exerc ises elevation and compressive bandage as well as massage of the digits and hand.  No indication for surgical intervention at this time.  Continue to observe for steady improvements, if any worsening in his symptoms please reconsult with any questions.

## 2023-01-29 NOTE — Progress Notes (Signed)
PT Cancellation Note  Patient Details Name: Nathaniel Warren MRN: [id number] DOB: [birth year]   Cancelled Treatment:    Reason Eval/Treat Not Completed: Other (comment) attempted to work with patient, per sitter he just fell asleep and really did not get any sleep last night. Will attempt to return if time/schedule allow.   Deniece Ree PT DPT PN2

## 2023-01-29 NOTE — Consult Note (Addendum)
[facility] Face-to-Face Psychiatry Consult    Patient Identification: AASIN DENHART MRN:  [id number] Principal Diagnosis: Spontaneous hematoma of lower leg Diagnosis:  Principal Problem:   Spontaneous hematoma of lower leg Active Problems:   Hyponatremia   Tobacco abuse   Alcoholic cirrhosis of liver with ascites ([place])   Suicidal ideations   Total Time spent with patient: 20 minutes  Subjective:   DSHAWN MCELYEA is a 57 year old male with right hand injury, thigh hematoma.  he has a psychiatric h/o bipolar d/o vs mdd, gad, ptsd, alcohol use disorder.   Initial:  YEICO WITTSTRUCK is a 57 y.o. male patient admitted with .hx for polysubstance abuse, ETOH use, nerve compression L5-S1, MDD, suicide attempt x 4-who endorses suicidal ideations, and intent.  He is guarded and does not disclose the plan.  He is triggered by his chronic nerve pain, financial losses and medical comorbidities. Patient is disgruntled and frustrated throughout the majority of the evaluation. He ruminates and perseverates about multiple psychosocial stressors with a large contribution being finances, lack of access, and underlying mood disorder.  Patient with feelings of hopelessness and despair, impaired judgement and lack of insight presents an acutely high safety risk and continues to be appropriate for inpatient psychiatric admission where he can be monitored for safety and stabilized on medications. He meets criteria for IVC which will be upheld at this time.   Patient presents similarly to initial interview above. He was alert, oriented, and cooperative with interview. Much of interview taken up with pt's discussion of inability to get any sort of financial help and frustrations with the system. Despite all this, he has maintained sobriety since he was told he had cirrhosis. Today he was agreeable to trial paxil which he feels has been the most helpful medication he has tried in the past, although remains fearful he will  not be able to try this outside the hospital. He continues to disagree with inpt treatment. Ongoing SI, although today reported more passive. No HI, AH/VH.     Past Psychiatric History: bipolar d/o vs mdd, gad, ptsd, alcohol use disorder.   Risk to Self:  yes  Risk to Others:  impulsive but denying HI  Prior Inpatient Therapy:   Prior Outpatient Therapy:    Past Medical History:  Past Medical History:  Diagnosis Date   Depression    Hernia, abdominal    Liver failure ([place])    Seizures ([place])    Stroke ([place])     Past Surgical History:  Procedure Laterality Date   ANKLE SURGERY     ANKLE SURGERY Right    HERNIA REPAIR     SPLENECTOMY     Family History:  Family History  Problem Relation Age of Onset   COPD Mother    Diabetes Father    Stroke Maternal Grandmother    Alzheimer's disease Maternal Grandfather    Cancer Paternal Grandmother    Stomach cancer Paternal Grandfather    Hypertension Other    Colon cancer Neg Hx    Esophageal cancer Neg Hx    Colon polyps Neg Hx    Inflammatory bowel disease Neg Hx    Liver disease Neg Hx    Pancreatic cancer Neg Hx    Rectal cancer Neg Hx    Family Psychiatric  History: did not report   Social History:  Social History   Substance and Sexual Activity  Alcohol Use Not Currently     Social History   Substance  and Sexual Activity  Drug Use Yes   Types: Marijuana    Social History   Socioeconomic History   Marital status: Single    Spouse name: Not on file   Number of children: 2   Years of education: 14   Highest education level: Not on file  Occupational History   Occupation: Nature conservation officer  Tobacco Use   Smoking status: Every Day    Packs/day: 1.00    Years: 30.00    Total pack years: 30.00    Types: Cigarettes   Smokeless tobacco: Former  Scientific laboratory technician Use: Never used  Substance and Sexual Activity   Alcohol use: Not Currently   Drug use: Yes    Types: Marijuana   Sexual activity: Yes     Comment:  wife had hysterectomy  Other Topics Concern   Not on file  Social History Narrative   ** Merged History Encounter **    Right handed   Unable to work   Lives with roommate and cousin   Drinks caffeine   One floor house   Social Determinants of Health   Financial Resource Strain: High Risk (07/15/2022)   Overall Financial Resource Strain (CARDIA)    Difficulty of Paying Living Expenses: Very hard  Food Insecurity: Food Insecurity Present (01/23/2023)   Hunger Vital Sign    Worried About [place] in the Last Year: Sometimes true    Ran Out of Food in the Last Year: Sometimes true  Transportation Needs: Unmet Transportation Needs (01/23/2023)   PRAPARE - Transportation    Lack of Transportation (Medical): Yes    Lack of Transportation (Non-Medical): Yes  Physical Activity: Inactive (07/15/2022)   Exercise Vital Sign    Days of Exercise per Week: 0 days    Minutes of Exercise per Session: 0 min  Stress: Stress Concern Present (07/15/2022)   Kinder    Feeling of Stress : Very much  Social Connections: Socially Isolated (07/15/2022)   Social Connection and Isolation Panel [NHANES]    Frequency of Communication with Friends and Family: More than three times a week    Frequency of Social Gatherings with Friends and Family: Once a week    Attends Religious Services: Never    Marine scientist or Organizations: No    Attends Archivist Meetings: Never    Marital Status: Divorced   Additional Social History:    Allergies:  No Known Allergies  Labs:  Results for orders placed or performed during the hospital encounter of 01/22/23 (from the past 48 hour(s))  Protime-INR     Status: Abnormal   Collection Time: 01/28/23  3:52 AM  Result Value Ref Range   Prothrombin Time 19.9 (H) 11.4 - 15.2 seconds   INR 1.7 (H) 0.8 - 1.2    Comment: (NOTE) INR goal varies based on device and disease  states. Performed at [facility], [place] [address] [place], [place] [address]   Comprehensive metabolic panel     Status: Abnormal   Collection Time: 01/28/23  3:52 AM  Result Value Ref Range   Sodium 132 (L) 135 - 145 mmol/L   Potassium 4.1 3.5 - 5.1 mmol/L   Chloride 100 98 - 111 mmol/L   CO2 26 22 - 32 mmol/L   Glucose, Bld 105 (H) 70 - 99 mg/dL    Comment: Glucose reference range applies only to samples taken after fasting for  at least 8 hours.   BUN 15 6 - 20 mg/dL   Creatinine, Ser 1.08 0.61 - 1.24 mg/dL   Calcium 8.5 (L) 8.9 - 10.3 mg/dL   Total Protein 6.7 6.5 - 8.1 g/dL   Albumin 2.5 (L) 3.5 - 5.0 g/dL   AST 64 (H) 15 - 41 U/L   ALT 25 0 - 44 U/L   Alkaline Phosphatase 185 (H) 38 - 126 U/L   Total Bilirubin 6.0 (H) 0.3 - 1.2 mg/dL   GFR, Estimated >60 >60 mL/min    Comment: (NOTE) Calculated using the CKD-EPI Creatinine Equation (2021)    Anion gap 6 5 - 15    Comment: Performed at [facility], [place] [address], [place], [place] [address]  CBC with Differential/Platelet     Status: Abnormal   Collection Time: 01/28/23  3:52 AM  Result Value Ref Range   WBC 10.5 4.0 - 10.5 K/uL   RBC 2.58 (L) 4.22 - 5.81 MIL/uL   Hemoglobin 9.4 (L) 13.0 - 17.0 g/dL   HCT 27.5 (L) 39.0 - 52.0 %   MCV 106.6 (H) 80.0 - 100.0 fL   MCH 36.4 (H) 26.0 - 34.0 pg   MCHC 34.2 30.0 - 36.0 g/dL   RDW 18.3 (H) 11.5 - 15.5 %   Platelets 79 (L) 150 - 400 K/uL    Comment: SPECIMEN CHECKED FOR CLOTS Immature Platelet Fraction may be clinically indicated, consider ordering this additional test [id number] CONSISTENT WITH PREVIOUS RESULT REPEATED TO VERIFY    nRBC 0.0 0.0 - 0.2 %   Neutrophils Relative % 47 %   Neutro Abs 5.1 1.7 - 7.7 K/uL   Lymphocytes Relative 30 %   Lymphs Abs 3.1 0.7 - 4.0 K/uL   Monocytes Relative 15 %   Monocytes Absolute 1.6 (H) 0.1 - 1.0 K/uL   Eosinophils Relative 6 %   Eosinophils Absolute 0.6 (H) 0.0 - 0.5 K/uL   Basophils  Relative 1 %   Basophils Absolute 0.1 0.0 - 0.1 K/uL   Immature Granulocytes 1 %   Abs Immature Granulocytes 0.05 0.00 - 0.07 K/uL    Comment: Performed at [facility], [place] [address] [place], [place] [address]  Folate     Status: None   Collection Time: 01/28/23 12:04 PM  Result Value Ref Range   Folate 17.0 >5.9 ng/mL    Comment: Performed at [facility], [place] [address] [place], [place] [address]  CBC     Status: Abnormal   Collection Time: 01/29/23  4:04 AM  Result Value Ref Range   WBC 11.5 (H) 4.0 - 10.5 K/uL   RBC 2.46 (L) 4.22 - 5.81 MIL/uL   Hemoglobin 9.0 (L) 13.0 - 17.0 g/dL   HCT 25.6 (L) 39.0 - 52.0 %   MCV 104.1 (H) 80.0 - 100.0 fL   MCH 36.6 (H) 26.0 - 34.0 pg   MCHC 35.2 30.0 - 36.0 g/dL   RDW 18.0 (H) 11.5 - 15.5 %   Platelets 80 (L) 150 - 400 K/uL    Comment: Immature Platelet Fraction may be clinically indicated, consider ordering this additional test [id number] CONSISTENT WITH PREVIOUS RESULT REPEATED TO VERIFY    nRBC 0.0 0.0 - 0.2 %    Comment: Performed at [facility], [place] [address], [place], [place] [id number]  Basic metabolic panel     Status: Abnormal   Collection Time: 01/29/23  4:04 AM  Result Value Ref Range  Sodium 134 (L) 135 - 145 mmol/L   Potassium 3.8 3.5 - 5.1 mmol/L   Chloride 99 98 - 111 mmol/L   CO2 26 22 - 32 mmol/L   Glucose, Bld 119 (H) 70 - 99 mg/dL    Comment: Glucose reference range applies only to samples taken after fasting for at least 8 hours.   BUN 19 6 - 20 mg/dL   Creatinine, Ser 1.08 0.61 - 1.24 mg/dL   Calcium 8.7 (L) 8.9 - 10.3 mg/dL   GFR, Estimated >60 >60 mL/min    Comment: (NOTE) Calculated using the CKD-EPI Creatinine Equation (2021)    Anion gap 9 5 - 15    Comment: Performed at [facility], [place] [address] [place], [place] [address]    Current Facility-Administered Medications  Medication Dose Route Frequency Provider Last  Rate Last Admin   0.9 %  sodium chloride infusion  250 mL Intravenous PRN Toy Baker, MD       folic acid (FOLVITE) tablet 1 mg  1 mg Oral Daily Doutova, Anastassia, MD   1 mg at 01/29/23 [id number]   furosemide (LASIX) tablet 40 mg  40 mg Oral Daily Toy Baker, MD   40 mg at 01/29/23 [id number]   morphine (PF) 2 MG/ML injection 2 mg  2 mg Intravenous Q3H PRN Toy Baker, MD   2 mg at 01/29/23 1504   ondansetron (ZOFRAN) injection 4 mg  4 mg Intravenous Q6H PRN Shelly Coss, MD   4 mg at 01/28/23 1429   oxyCODONE-acetaminophen (PERCOCET/ROXICET) 5-325 MG per tablet 2 tablet  2 tablet Oral Q4H PRN Nita Sells, MD   2 tablet at 01/28/23 2245   PARoxetine (PAXIL) tablet 10 mg  10 mg Oral Daily Kaesyn Johnston A       phytonadione (VITAMIN K) 10 mg in dextrose 5 % 50 mL IVPB  10 mg Intravenous Daily Nita Sells, MD 50 mL/hr at 01/29/23 1321 10 mg at 01/29/23 1321   polyethylene glycol (MIRALAX / GLYCOLAX) packet 17 g  17 g Oral Daily Shelly Coss, MD   17 g at 01/29/23 0834   risperiDONE (RISPERDAL M-TABS) disintegrating tablet 2 mg  2 mg Oral Q8H PRN Suella Broad, FNP       And   ziprasidone (GEODON) injection 20 mg  20 mg Intramuscular PRN Starkes-Perry, Gayland Curry, FNP       sodium chloride flush (NS) 0.9 % injection 3 mL  3 mL Intravenous Q12H Doutova, Anastassia, MD   3 mL at 01/29/23 0834   sodium chloride flush (NS) 0.9 % injection 3 mL  3 mL Intravenous PRN Doutova, Nyoka Lint, MD       spironolactone (ALDACTONE) tablet 50 mg  50 mg Oral Daily Doutova, Anastassia, MD   50 mg at 01/29/23 [id number]   thiamine (VITAMIN B1) tablet 100 mg  100 mg Oral Daily Toy Baker, MD   100 mg at 01/29/23 [id number]    Musculoskeletal: Strength & Muscle Tone: Laying in bed   Gait & Station: Laying in bed   Patient leans: Laying in bed     Psychiatric Specialty Exam:  Presentation  General Appearance:  -- (jaundiced)  Eye  Contact: Fair  Speech: Normal Rate  Speech Volume: Normal  Handedness: Right   Mood and Affect  Mood: -- Engineer, materials)  Affect: Full Range (Irritable, depressed)   Thought Process  Thought Processes: Linear  Descriptions of Associations:Intact  Orientation:Full (Time, Place and Person)  Thought Content: Suicidal History of Schizophrenia/Schizoaffective  disorder:No  Duration of Psychotic Symptoms:No data recorded Hallucinations: denies Ideas of Reference: denies Suicidal Thoughts: Yes, denies plan  Homicidal Thoughts: denies   Sensorium  Memory: Immediate Good; Immediate Poor; Remote Good  Judgment: Impaired  Insight: Poor   Executive Functions  Concentration: Fair  Attention Span: Fair  Recall: [employer] of Knowledge: Fair  Language: Fair   Psychomotor Activity  Psychomotor Activity: Psychomotor Activity: Normal  restless, rocking back and forth   Assets  Assets: Communication Skills; Desire for Improvement   Sleep  Sleep: Fair Sleep: Fair    Physical Exam: Minimal change since last exam Physical Exam Vitals reviewed.  HENT:     Head: Normocephalic and atraumatic.  Skin:    Coloration: Skin is jaundiced.  Neurological:     General: No focal deficit present.     Mental Status: He is alert and oriented to person, place, and time.  Psychiatric:        Attention and Perception: He does not perceive auditory or visual hallucinations.        Mood and Affect: Mood is depressed.        Speech: Speech normal.        Behavior: Behavior is cooperative.        Thought Content: Thought content is not paranoid. Thought content includes suicidal ideation.        Judgment: Judgment is inappropriate. Judgment is not impulsive.    Review of Systems  Constitutional: Negative.   Psychiatric/Behavioral:  Positive for depression and suicidal ideas. The patient is nervous/anxious.    Blood pressure (!) 95/57, pulse 82, temperature  99.7 F (37.6 C), resp. rate 14, height '5\' 9"'$  (1.753 m), weight 77.7 kg, SpO2 94 %. Body mass index is 25.3 kg/m.  Treatment Plan Summary:  Assessment: -MDD vs Bipoalr disorde r- pt consenting to paxil only at this time -GAD -PTSD -Hx of Alcohol use disorder  Plan: - Unable to contract for safety, will continue 1-1 sitter -Continue ivc - Continue recommendation for inpatient psych admission when medically cleared  - Addendum: started paxil  -He declines starting any scheduled psychiatric medications -consult SW to talk with patient, he would like some direction/assistance in regards to accessing social services  -Continue collaborating care with orthopedic for his hand, not surgical candidate, getting daily wound checks.  They will continue to observe for another 24 hours.  Psychiatry will continue to follow.   Disposition: Recommend psychiatric Inpatient admission when medically cleared. Supportive therapy provided about ongoing stressors.  Kerrie Buffalo Wilford Merryfield 01/29/2023 4:12 PM

## 2023-01-29 NOTE — Progress Notes (Signed)
PROGRESS NOTE  Nathaniel Warren  [id number] DOB: January 17, 1966 DOA: 01/22/2023 PCP: [name], NP   Brief Narrative: Patient is a 57 year old male with history of alcohol abuse/cirrhosis of liver, depression, chronic right ankle pain, suicidal attempts presented to the emergency department with complaint of left groin swelling, right arm swelling.  Found to have hematoma of the left groin as well as swelling of the right arm consistent with hematoma.  CT hand showed diffuse swelling without any abscess.  Due to suicidal ideation, psychiatry was consulted.  Hand surgery also consulted.  Swelling  improved.  Hematoma remains stable.  Given few days of vitamin K given this hospitalization for elevated INR.  PT/OT recommending acute inpatient rehab/SNF but looks difficult due to his psychiatric history.  Psychiatry recommending inpatient psychiatric admission.  TOC following  Assessment & Plan:  Principal Problem:   Spontaneous hematoma of lower leg Active Problems:   Alcoholic cirrhosis of liver with ascites (HCC)   Hyponatremia   Tobacco abuse   Suicidal ideations  Right arm/left groin swelling consistent with hematoma: Hand surgery was consulted and was following.  Improvement in the range of motion.  No indication for surgery for hematoma and expected slow reabsorption as per hand surgery.  Given few doses of vitamin K, FFP 2 units here.  Still complains of  right hand pain, right hand remains edematous.  He is able to move his fingers.  I have requested Dr. Greta Doom for follow-up today Continue elevation, ice application, pain medication.  Chronic pain syndrome: Has history of L5-S1 nerve compression with sciatica, chronic knee pain with foot drop.  Continue pain management, supportive care.  PT/OT recommending AIR on discharge but looks difficult because of his psychiatric history.  He may need to increase his mobility while inpatient before being discharged to inpatient  psychiatry Continue PT/OT therapy while inpatient  Spermatocele: Recommend ultrasound in 3 months  History of alcohol abuse/liver cirrhosis: Quit in 2023.  Elevated INR.  Given few days of vitamin K.  Has mild elevated liver enzymes along with bilirubin. Recommend to follow-up with GI/hepatology as an outpatient.Follows with [facility].  Has jaundice  Macrocytic anemia/thrombocytopenia: This is secondary to chronic alcohol abuse, cirrhosis. continue thiamine /folic acid.  Hemoglobin stable.  Normal folate, vitamin B12 high  Nausea, vomiting: Had nausea with vomiting on 2/21.  Resolved after having a big bowel movement  Suicidal attempt/PTSD: Psychiatry following.  Current IVC.  Plan is to discharge to inpatient psychiatry when possible.  Patient currently denies inpatient psychiatric transfer.  Admits that he still has Suicidal thoughts and ideations          DVT prophylaxis:SCDs Start: 01/23/23 0025     Code Status: Full Code  Family Communication: None at the bedside  Patient status:Inpatient  Patient is from :Home  Anticipated discharge to: Inpatient psychiatry  Estimated DC date: Whenever possible   Consultants: Hand surgery, psychiatry  Procedures: None  Antimicrobials:  Anti-infectives (From admission, onward)    None       Subjective: Patient seen and examined at bedside today.  He looks in better mood today.  His pain on the right hand is better though the swelling is persistent from hematoma.  Able to move his fingers.  No nausea or vomiting today  Objective: Vitals:   01/28/23 0845 01/28/23 1442 01/28/23 2129 01/29/23 0525  BP: (!) 103/55 118/64 (!) 113/56 (!) 111/55  Pulse: 73 78 73 68  Resp: 15 17 18 18  $ Temp: 98.7 F (  37.1 C) 98.5 F (36.9 C) 99.2 F (37.3 C) 99 F (37.2 C)  TempSrc: Oral Oral Oral Oral  SpO2: 97% 98% 95% 95%  Weight:      Height:        Intake/Output Summary (Last 24 hours) at 01/29/2023 1114 Last data filed at  01/29/2023 0910 Gross per 24 hour  Intake 1530.31 ml  Output 1075 ml  Net 455.31 ml   Filed Weights   01/23/23 2007  Weight: 77.7 kg    Examination:  General exam: Overall comfortable, not in distress,icteric HEENT: PERRL Respiratory system:  no wheezes or crackles  Cardiovascular system: S1 & S2 heard, RRR.  Gastrointestinal system: Abdomen is nondistended, soft and nontender. Central nervous system: Alert and oriented Extremities: Edema of the right hand, right hand covered with dressing, hematoma/ecchymosis, hematomatous bulla on the dorsum of the right hand Skin: No rashes, no ulcers,no icterus     Data Reviewed: I have personally reviewed following labs and imaging studies  CBC: Recent Labs  Lab 01/22/23 1928 01/23/23 0034 01/23/23 1859 01/24/23 0102 01/25/23 0329 01/28/23 0352 01/29/23 0404  WBC 8.8   < > 9.7 9.1 10.2 10.5 11.5*  NEUTROABS 5.0  --   --   --  4.9 5.1  --   HGB 11.3*   < > 10.3* 10.2* 10.1* 9.4* 9.0*  HCT 31.8*   < > 28.5* 27.9* 28.2* 27.5* 25.6*  MCV 101.3*   < > 101.8* 100.0 102.5* 106.6* 104.1*  PLT 108*   < > 93* 92* 83* 79* 80*   < > = values in this interval not displayed.   Basic Metabolic Panel: Recent Labs  Lab 01/23/23 0455 01/24/23 0102 01/26/23 0335 01/27/23 0400 01/28/23 0352 01/29/23 0404  NA 134* 136 137 135 132* 134*  K 3.8 4.0 4.5 4.1 4.1 3.8  CL 98 100 99 99 100 99  CO2 26 26 31 28 26 26  $ GLUCOSE 104* 115* 105* 95 105* 119*  BUN 10 12 20 16 15 19  $ CREATININE 0.90 0.97 1.08 1.11 1.08 1.08  CALCIUM 8.7* 8.4* 8.9 8.7* 8.5* 8.7*  MG 1.8  --   --   --   --   --   PHOS 4.0  --   --   --   --   --      No results found for this or any previous visit (from the past 240 hour(s)).   Radiology Studies: No results found.  Scheduled Meds:  folic acid  1 mg Oral Daily   furosemide  40 mg Oral Daily   polyethylene glycol  17 g Oral Daily   sodium chloride flush  3 mL Intravenous Q12H   spironolactone  50 mg Oral Daily    thiamine  100 mg Oral Daily   Continuous Infusions:  sodium chloride     phytonadione (VITAMIN K) 10 mg in dextrose 5 % 50 mL IVPB 10 mg (01/28/23 1206)     LOS: 6 days   Shelly Coss, MD [facility] P2/22/2024, 11:14 AM

## 2023-01-29 NOTE — TOC Progression Note (Addendum)
Transition of Care [facility]) - Progression Note    Patient Details  Name: Nathaniel Warren MRN: [id number] Date of Birth: [birth year]  Transition of Care [facility]) CM/SW [place], [place] Phone Number: 01/29/2023, 11:06 AM  Clinical Narrative:     [facility] AC notified via secure chat that pt needing inpatient psych bed. Awaiting response.   1200: CSW informed that there are no beds available today at [facility] and BMU.  1300: New IVC completed. Magistrate signed IVC papers and faxed them back. 4 copies given to Network engineer on unit. CSW called law enforcement for service only of IVC papers.   Expected Discharge Plan: Psychiatric Hospital Barriers to Discharge: Continued Medical Work up  Expected Discharge Plan and Services In-house Referral: Clinical Social Work Discharge Planning Services: NA Post Acute Care Choice: NA Living arrangements for the past 2 months: Single Family Home                 DME Arranged: N/A DME Agency: NA                   Social Determinants of Health (SDOH) Interventions [place]: Food Insecurity Present (01/23/2023)  Housing: Medium Risk (01/23/2023)  Transportation Needs: Unmet Transportation Needs (01/23/2023)  Utilities: Not At Risk (01/23/2023)  Alcohol Screen: Medium Risk (10/27/2017)  Depression (PHQ2-9): High Risk (12/18/2022)  Financial Resource Strain: High Risk (07/15/2022)  Physical Activity: Inactive (07/15/2022)  Social Connections: Socially Isolated (07/15/2022)  Stress: Stress Concern Present (07/15/2022)  Tobacco Use: High Risk (01/23/2023)    Readmission Risk Interventions     No data to display

## 2023-01-30 LAB — GLUCOSE, CAPILLARY: Glucose-Capillary: 101 mg/dL — ABNORMAL HIGH (ref 70–99)

## 2023-01-30 LAB — PROTIME-INR
INR: 1.8 — ABNORMAL HIGH (ref 0.8–1.2)
Prothrombin Time: 20.5 seconds — ABNORMAL HIGH (ref 11.4–15.2)

## 2023-01-30 MED ORDER — PAROXETINE HCL 10 MG PO TABS
10.0000 mg | ORAL_TABLET | Freq: Every day | ORAL | Status: DC
  Administered 2023-01-31 – 2023-03-01 (×30): 10 mg via ORAL
  Filled 2023-01-30 (×31): qty 1

## 2023-01-30 NOTE — Progress Notes (Signed)
Occupational Therapy Treatment Patient Details Name: Nathaniel Warren MRN: [id number] DOB: 1966/03/21 Today's Date: 01/30/2023   History of present illness 57 yo male with onset of grion and RT dorsal hand bruising and edema with reported light trauma during a fall.  Has h/o R foot drop without AFO. + for suicidal ideations.   PMHx:  etoh abuse, depression with h/o 4 suicide attempts, stroke, seizures, liver failure, R ankle fracture with ORIF, atherosclerosis, cirrhosis, diverticulosis, osteopenia   OT comments  Patient progressing and showed improved tolerance to retrograde massage for edema management, PROM digit flexion stretches and general ROM exercises, completing 10 x3 RT hand exercises with cues for slow movements to allow for maximum ROM.  Pt showing improved attention to task and topic but still requires reminders for exercises and elevation. Patient remains limited by RT hand pain/edema/decreased ROM, cognitive deficits, generalized weakness and decreased activity tolerance along with deficits noted below. Pt now demonstrating good rehab potential and would benefit from continued skilled OT to increase safety and independence with ADLs and functional transfers to allow pt to return home safely and reduce caregiver burden and fall risk.    Recommendations for follow up therapy are one component of a multi-disciplinary discharge planning process, led by the attending physician.  Recommendations may be updated based on patient status, additional functional criteria and insurance authorization.    Follow Up Recommendations  Acute inpatient rehab (3hours/day)     Assistance Recommended at Discharge Frequent or constant Supervision/Assistance  Patient can return home with the following  A little help with bathing/dressing/bathroom;A little help with walking and/or transfers;Assistance with cooking/housework;Assist for transportation;Direct supervision/assist for medications management    Equipment Recommendations  None recommended by OT    Recommendations for Other Services      Precautions / Restrictions Precautions Precautions: Fall Precaution Comments: Suicide precautions, 1:1 sitter Restrictions Weight Bearing Restrictions: No Other Position/Activity Restrictions: Limited RT hand WB due to pain                                                                                                ADL either performed or assessed with clinical judgement   ADL Overall ADL's : Needs assistance/impaired (Continued to address RT hand exercises, edema mangement and functiona to allow pt to use RT hand functionally in ADLs.)                                            Extremity/Trunk Assessment Upper Extremity Assessment RUE Deficits / Details: See Exercise section.  After retrograde edema massage and exercises, pt able to bring RT D3 within 3.7cm of distal palmer crease [facility]) showing improved finger flexion and progress towards composite grip which remains incomplete. RUE Sensation:  (Improved tolerance to touch. Now dorsal surface of hand most painful.)       Cervical / Trunk Assessment Cervical / Trunk Assessment: Normal    Vision Baseline Vision/History: 1 Wears glasses Vision Assessment?: No apparent visual deficits  Cognition Arousal/Alertness: Awake/alert Behavior During Therapy: Impulsive Overall Cognitive Status: Within Functional Limits for tasks assessed                                 General Comments: Poor recall of exercises and continues to need redirection, but improved focus on tasks/topics and working well with instructions. Continues to need cues for slow controlled movements to allow for full ROM.        Exercises Other Exercises Other Exercises: Pt able to recall 1/3 exercises and 3/3 with visual/verbal cues. Pt performed 10 reps open/close hand with  cues to allow time for full ROM to fingers as able. 10 reps finger add/abd and 10 reps to MP extension with imrpvoed movements and ROM. Attempted opposition but still very incomplete.   Re-wrapped RUE with ace wrap to continue gentle compression.  Prior to ther ex, OT provided gentle finger to each RT digit followed by retrograde massage to each digitwith pt tolerating well.           General Comments      Pertinent Vitals/ Pain       Pain Assessment Pain Assessment: Faces Faces Pain Scale: Hurts even more Pain Location: RT hand, dorsal surface with Light pressure/touch. Pain Intervention(s): Limited activity within patient's tolerance, Monitored during session, Repositioned (Elevated)  Home Living                                          Prior Functioning/Environment              Frequency  Min 3X/week        Progress Toward Goals  OT Goals(current goals can now be found in the care plan section)  Progress towards OT goals: Progressing toward goals  Acute Rehab OT Goals Patient Stated Goal: "Use this hand" OT Goal Formulation: With patient Time For Goal Achievement: 02/09/23 Potential to Achieve Goals: Good  Plan Discharge plan remains appropriate    Co-evaluation                 AM-PAC OT "6 Clicks" Daily Activity     Outcome Measure   Help from another person eating meals?: A Little Help from another person taking care of personal grooming?: A Little Help from another person toileting, which includes using toliet, bedpan, or urinal?: A Little Help from another person bathing (including washing, rinsing, drying)?: A Little Help from another person to put on and taking off regular upper body clothing?: A Little Help from another person to put on and taking off regular lower body clothing?: A Little 6 Click Score: 18    End of Session    OT Visit Diagnosis: Pain;History of falling (Z91.81);Repeated falls (R29.6);Muscle weakness  (generalized) (M62.81);Unsteadiness on feet (R26.81) Pain - Right/Left: Right Pain - part of body: Ankle and joints of foot;Hand   Activity Tolerance Patient tolerated treatment well   Patient Left in bed;with call bell/phone within reach;with nursing/sitter in room   Nurse Communication          Time: [id number] OT Time Calculation (min): 27 min  Charges: OT General Charges $OT Visit: 1 Visit OT Treatments $Therapeutic Exercise: 8-22 mins $Massage: 8-22 mins  Anderson [place], [place] Office: [phone] 01/30/2023  Nathaniel Warren 01/30/2023, 12:41 PM

## 2023-01-30 NOTE — Plan of Care (Signed)
  Problem: Coping: Goal: Level of anxiety will decrease Outcome: Progressing   Problem: Pain Managment: Goal: General experience of comfort will improve Outcome: Progressing   Problem: Safety: Goal: Ability to remain free from injury will improve Outcome: Progressing   Problem: Skin Integrity: Goal: Risk for impaired skin integrity will decrease Outcome: Progressing   

## 2023-01-30 NOTE — Progress Notes (Addendum)
PROGRESS NOTE  Nathaniel Warren  [id number] DOB: February 19, 1966 DOA: 01/22/2023 PCP: [name], NP   Brief Narrative: Patient is a 57 year old male with history of alcohol abuse/cirrhosis of liver, depression, chronic right ankle pain, suicidal attempts presented to the emergency department with complaint of left groin swelling, right arm swelling.  Found to have hematoma of the left groin as well as swelling of the right arm consistent with hematoma.  CT hand showed diffuse swelling without any abscess.  Due to suicidal ideation, psychiatry was consulted.  Hand surgery also consulted.  Swelling  improved.  Hematoma remains stable.  Given few days of vitamin K given this hospitalization for elevated INR.  PT/OT recommending acute inpatient rehab/SNF but looks difficult due to his psychiatric history.  Psychiatry recommending inpatient psychiatric admission.  TOC following  Assessment & Plan:  Principal Problem:   Spontaneous hematoma of lower leg Active Problems:   Alcoholic cirrhosis of liver with ascites (HCC)   Hyponatremia   Tobacco abuse   Suicidal ideations  Right arm/left groin swelling consistent with hematoma: Hand surgery was consulted and was following.  Improvement in the range of motion.  No indication for surgery for hematoma and expected slow reabsorption as per hand surgery.  Given few doses of vitamin K, FFP 2 units here.  Still complains of  right hand pain, right hand remains edematous.  He is able to move his fingers.  We requested Dr. Greta Doom for follow-up on 2/22 , recommendation is the same Continue elevation, ice application, pain medication.  Chronic pain syndrome: Has history of L5-S1 nerve compression with sciatica, chronic knee pain with foot drop.  Continue pain management, supportive care.  PT/OT recommending AIR on discharge but looks difficult because of his psychiatric history.  He may need to increase his mobility while inpatient before being discharged to  inpatient psychiatry Continue PT/OT therapy while inpatient  Spermatocele: Recommend ultrasound in 3 months  History of alcohol abuse/liver cirrhosis: Quit in 2023.  Elevated INR.  Given few days of vitamin K.  Has mild elevated liver enzymes along with bilirubin. Recommend to follow-up with GI/hepatology as an outpatient.Follows with [facility].  Has jaundice. On spironolactone Lasix at home, currently on hold due to low blood pressure  Macrocytic anemia/thrombocytopenia: This is secondary to chronic alcohol abuse, cirrhosis. continue thiamine /folic acid.  Hemoglobin stable.  Normal folate, vitamin B12 high  Nausea, vomiting: Had nausea with vomiting on 2/21.  Resolved after having a big bowel movement  Suicidal attempt/PTSD: Psychiatry following.  Current IVC.  Plan is to discharge to inpatient psychiatry when possible.  Patient currently denies inpatient psychiatric transfer.  Admits that he still has Suicidal thoughts and ideations          DVT prophylaxis:SCDs Start: 01/23/23 0025     Code Status: Full Code  Family Communication: None at the bedside  Patient status:Inpatient  Patient is from :Home  Anticipated discharge to: Inpatient psychiatry  Estimated DC date: Will wait for further improvement in the edema and pain of the right hand before discharge   Consultants: Hand surgery, psychiatry  Procedures: None  Antimicrobials:  Anti-infectives (From admission, onward)    None       Subjective: Patient seen and examined at bedside today.  Hemodynamically stable.  Lying in bed.  Sitter at bedside.  Continues to complain of pain on the right hand.  Able to move his fingers.  Right hand remains edematous with hemorrhagic bulla on the dorsal  Objective: Vitals:  01/29/23 1848 01/29/23 1951 01/30/23 0444 01/30/23 1033  BP:  (!) 97/55 (!) 103/58 (!) 99/59  Pulse:  81 79 60  Resp:  18 17   Temp: (!) 100.7 F (38.2 C) 98.5 F (36.9 C) 99.4 F (37.4 C)    TempSrc: Oral Oral Oral   SpO2:  97% 94%   Weight:      Height:        Intake/Output Summary (Last 24 hours) at 01/30/2023 1123 Last data filed at 01/30/2023 0944 Gross per 24 hour  Intake 480 ml  Output 975 ml  Net -495 ml   Filed Weights   01/23/23 2007  Weight: 77.7 kg    Examination:  General exam: Overall comfortable, not in distress, icteric HEENT: PERRL Respiratory system:  no wheezes or crackles  Cardiovascular system: S1 & S2 heard, RRR.  Gastrointestinal system: Abdomen is nondistended, soft and nontender. Central nervous system: Alert and oriented Extremities: Edema of the right hand, right hand covered with dressing,  ecchymosis, hemorrhagic bulla on the dorsum of right hand Skin: icterus     Data Reviewed: I have personally reviewed following labs and imaging studies  CBC: Recent Labs  Lab 01/23/23 1859 01/24/23 0102 01/25/23 0329 01/28/23 0352 01/29/23 0404  WBC 9.7 9.1 10.2 10.5 11.5*  NEUTROABS  --   --  4.9 5.1  --   HGB 10.3* 10.2* 10.1* 9.4* 9.0*  HCT 28.5* 27.9* 28.2* 27.5* 25.6*  MCV 101.8* 100.0 102.5* 106.6* 104.1*  PLT 93* 92* 83* 79* 80*   Basic Metabolic Panel: Recent Labs  Lab 01/24/23 0102 01/26/23 0335 01/27/23 0400 01/28/23 0352 01/29/23 0404  NA 136 137 135 132* 134*  K 4.0 4.5 4.1 4.1 3.8  CL 100 99 99 100 99  CO2 '26 31 28 26 26  '$ GLUCOSE 115* 105* 95 105* 119*  BUN '12 20 16 15 19  '$ CREATININE 0.97 1.08 1.11 1.08 1.08  CALCIUM 8.4* 8.9 8.7* 8.5* 8.7*     No results found for this or any previous visit (from the past 240 hour(s)).   Radiology Studies: No results found.  Scheduled Meds:  folic acid  1 mg Oral Daily   furosemide  40 mg Oral Daily   PARoxetine  10 mg Oral Daily   polyethylene glycol  17 g Oral Daily   sodium chloride flush  3 mL Intravenous Q12H   spironolactone  50 mg Oral Daily   thiamine  100 mg Oral Daily   Continuous Infusions:  sodium chloride       LOS: 7 days   Shelly Coss,  MD [facility] P2/23/2024, 11:23 AM

## 2023-01-30 NOTE — Progress Notes (Signed)
PT Cancellation Note  Patient Details Name: Nathaniel Warren MRN: [id number] DOB: 25-Jun-1966   Cancelled Treatment:     Pt declined.  "I can't today" with issues of nausea.  Sitter in room.  Will attempt to see another day/time as schedule permits.   Rica Koyanagi  PTA Acute  Rehabilitation Services Office M-F          [phone] Weekend pager [phone]

## 2023-01-30 NOTE — TOC Progression Note (Signed)
Transition of Care [facility]) - Progression Note    Patient Details  Name: SUJAN BAZER MRN: [id number] Date of Birth: 1966-08-25  Transition of Care [facility]) CM/SW [place], LCSW Phone Number: 01/30/2023, 3:09 PM  Clinical Narrative:    Pt not medically stable. Per psych pt meet inpatient psych criteria. TOC to continue to follow.    Expected Discharge Plan: Psychiatric Hospital Barriers to Discharge: Continued Medical Work up  Expected Discharge Plan and Services In-house Referral: Clinical Social Work Discharge Planning Services: NA Post Acute Care Choice: NA Living arrangements for the past 2 months: Single Family Home                 DME Arranged: N/A DME Agency: NA                   Social Determinants of Health (SDOH) Interventions [place]: Food Insecurity Present (01/23/2023)  Housing: Medium Risk (01/23/2023)  Transportation Needs: Unmet Transportation Needs (01/23/2023)  Utilities: Not At Risk (01/23/2023)  Alcohol Screen: Medium Risk (10/27/2017)  Depression (PHQ2-9): High Risk (12/18/2022)  Financial Resource Strain: High Risk (07/15/2022)  Physical Activity: Inactive (07/15/2022)  Social Connections: Socially Isolated (07/15/2022)  Stress: Stress Concern Present (07/15/2022)  Tobacco Use: High Risk (01/23/2023)    Readmission Risk Interventions     No data to display

## 2023-01-30 NOTE — Consult Note (Signed)
[facility] Face-to-Face Psychiatry Consult    Patient Identification: Nathaniel Warren MRN:  [id number] Principal Diagnosis: Spontaneous hematoma of lower leg Diagnosis:  Principal Problem:   Spontaneous hematoma of lower leg Active Problems:   Hyponatremia   Tobacco abuse   Alcoholic cirrhosis of liver with ascites ([place])   Suicidal ideations   Total Time spent with patient: 20 minutes  Subjective:   Nathaniel Warren is a 57 year old male with right hand injury, thigh hematoma.  he has a psychiatric h/o bipolar d/o vs mdd, gad, ptsd, alcohol use disorder.   Initial:  Nathaniel Warren is a 57 y.o. male patient admitted with .hx for polysubstance abuse, ETOH use, nerve compression L5-S1, MDD, suicide attempt x 4-who endorses suicidal ideations, and intent.  He is guarded and does not disclose the plan.  He is triggered by his chronic nerve pain, financial losses and medical comorbidities. Patient is disgruntled and frustrated throughout the majority of the evaluation. He ruminates and perseverates about multiple psychosocial stressors with a large contribution being finances, lack of access, and underlying mood disorder.  Patient with feelings of hopelessness and despair, impaired judgement and lack of insight presents an acutely high safety risk and continues to be appropriate for inpatient psychiatric admission where he can be monitored for safety and stabilized on medications. He meets criteria for IVC which will be upheld at this time.   Patient presents similarly to initial interview above. He was alert, oriented, and cooperative with interview. He continues to disagree with recommendation for inpt hospitalization but understands reasoning. Spent fair amt of time discussing systemic barriers to wellness. No real effect or side effect from paxil yet - moved to PM. Today he denied SI, but this was in context of discussing inpt hospitalization. Noted to have broadening of affect, smiling and laughing at  times. Agreeable to revisit on Monday. Aware of need for PT. No HI, AH/VH.     Spoke to friend [name] w/ pt consent He was aware that Tomasz was being seen by psychiatry. Caretaker is 39something years old, has been taking care of him for a year and a half. He has been generally laying at home, sick, refusing to take care of himself. He pays for most of pt's medications and needs out of his fixed income. He shares similar concerns about inability to get SSD. He does share that he was unable to pay for pt's mental health medications. He has mentioned suicide to Annetta North several times in the context of feeling like a burden and being better off dead. He has just now opened up to [place] about how he has been feeling. He had to force Ardit to come to the hospital to start with, he is aware that probably doesn't have long to live regardless of medical intervention. He hasn't seen much of an improvement in pt's mental health, thinks he is putting on a front for mental health workers. He does not want Render to leave the hospital until he is 100% stable because of how hard it was to get him to go to the hospital - had to threaten him with eviction. Friend asks about hospice. [name] thinks a psych hospital would be helpful  but understands Tyreck is not medically stable to think about that. [name] notes that Marcellino does take his medication and responds well when he can afford it. Has had at least 3 falls in the house.     Past Psychiatric History: bipolar d/o vs mdd, gad, ptsd,  alcohol use disorder.   Risk to Self:  yes  Risk to Others:  impulsive but denying HI  Prior Inpatient Therapy:   Prior Outpatient Therapy:    Past Medical History:  Past Medical History:  Diagnosis Date   Depression    Hernia, abdominal    Liver failure (HCC)    Seizures ([place])    Stroke ([place])     Past Surgical History:  Procedure Laterality Date   ANKLE SURGERY     ANKLE SURGERY Right    HERNIA REPAIR     SPLENECTOMY      Family History:  Family History  Problem Relation Age of Onset   COPD Mother    Diabetes Father    Stroke Maternal Grandmother    Alzheimer's disease Maternal Grandfather    Cancer Paternal Grandmother    Stomach cancer Paternal Grandfather    Hypertension Other    Colon cancer Neg Hx    Esophageal cancer Neg Hx    Colon polyps Neg Hx    Inflammatory bowel disease Neg Hx    Liver disease Neg Hx    Pancreatic cancer Neg Hx    Rectal cancer Neg Hx    Family Psychiatric  History: did not report   Social History:  Social History   Substance and Sexual Activity  Alcohol Use Not Currently     Social History   Substance and Sexual Activity  Drug Use Yes   Types: Marijuana    Social History   Socioeconomic History   Marital status: Single    Spouse name: Not on file   Number of children: 2   Years of education: 14   Highest education level: Not on file  Occupational History   Occupation: Nature conservation officer  Tobacco Use   Smoking status: Every Day    Packs/day: 1.00    Years: 30.00    Total pack years: 30.00    Types: Cigarettes   Smokeless tobacco: Former  Scientific laboratory technician Use: Never used  Substance and Sexual Activity   Alcohol use: Not Currently   Drug use: Yes    Types: Marijuana   Sexual activity: Yes    Comment:  wife had hysterectomy  Other Topics Concern   Not on file  Social History Narrative   ** Merged History Encounter **    Right handed   Unable to work   Lives with roommate and cousin   Drinks caffeine   One floor house   Social Determinants of Health   Financial Resource Strain: High Risk (07/15/2022)   Overall Financial Resource Strain (CARDIA)    Difficulty of Paying Living Expenses: Very hard  Food Insecurity: Food Insecurity Present (01/23/2023)   Hunger Vital Sign    Worried About Running Out of Food in the Last Year: Sometimes true    Ran Out of Food in the Last Year: Sometimes true  Transportation Needs: Unmet  Transportation Needs (01/23/2023)   PRAPARE - Transportation    Lack of Transportation (Medical): Yes    Lack of Transportation (Non-Medical): Yes  Physical Activity: Inactive (07/15/2022)   Exercise Vital Sign    Days of Exercise per Week: 0 days    Minutes of Exercise per Session: 0 min  Stress: Stress Concern Present (07/15/2022)   [employer]    Feeling of Stress : Very much  Social Connections: Socially Isolated (07/15/2022)   Social Connection and Isolation Panel [NHANES]    Frequency  of Communication with Friends and Family: More than three times a week    Frequency of Social Gatherings with Friends and Family: Once a week    Attends Religious Services: Never    Marine scientist or Organizations: No    Attends Archivist Meetings: Never    Marital Status: Divorced   Additional Social History:    Allergies:  No Known Allergies  Labs:  Results for orders placed or performed during the hospital encounter of 01/22/23 (from the past 48 hour(s))  CBC     Status: Abnormal   Collection Time: 01/29/23  4:04 AM  Result Value Ref Range   WBC 11.5 (H) 4.0 - 10.5 K/uL   RBC 2.46 (L) 4.22 - 5.81 MIL/uL   Hemoglobin 9.0 (L) 13.0 - 17.0 g/dL   HCT 25.6 (L) 39.0 - 52.0 %   MCV 104.1 (H) 80.0 - 100.0 fL   MCH 36.6 (H) 26.0 - 34.0 pg   MCHC 35.2 30.0 - 36.0 g/dL   RDW 18.0 (H) 11.5 - 15.5 %   Platelets 80 (L) 150 - 400 K/uL    Comment: Immature Platelet Fraction may be clinically indicated, consider ordering this additional test [id number] CONSISTENT WITH PREVIOUS RESULT REPEATED TO VERIFY    nRBC 0.0 0.0 - 0.2 %    Comment: Performed at [facility], [place] [address], [place], [place] [id number]  Basic metabolic panel     Status: Abnormal   Collection Time: 01/29/23  4:04 AM  Result Value Ref Range   Sodium 134 (L) 135 - 145 mmol/L   Potassium 3.8 3.5 - 5.1 mmol/L   Chloride 99 98 - 111  mmol/L   CO2 26 22 - 32 mmol/L   Glucose, Bld 119 (H) 70 - 99 mg/dL    Comment: Glucose reference range applies only to samples taken after fasting for at least 8 hours.   BUN 19 6 - 20 mg/dL   Creatinine, Ser 1.08 0.61 - 1.24 mg/dL   Calcium 8.7 (L) 8.9 - 10.3 mg/dL   GFR, Estimated >60 >60 mL/min    Comment: (NOTE) Calculated using the CKD-EPI Creatinine Equation (2021)    Anion gap 9 5 - 15    Comment: Performed at [facility], [place] [address] [place], [place] [address]  Protime-INR     Status: Abnormal   Collection Time: 01/30/23  8:31 AM  Result Value Ref Range   Prothrombin Time 20.5 (H) 11.4 - 15.2 seconds   INR 1.8 (H) 0.8 - 1.2    Comment: (NOTE) INR goal varies based on device and disease states. Performed at [facility], [place] [address], [place], [place] [address]     Current Facility-Administered Medications  Medication Dose Route Frequency Provider Last Rate Last Admin   0.9 %  sodium chloride infusion  250 mL Intravenous PRN Toy Baker, MD       folic acid (FOLVITE) tablet 1 mg  1 mg Oral Daily Doutova, Anastassia, MD   1 mg at 01/30/23 1114   morphine (PF) 2 MG/ML injection 2 mg  2 mg Intravenous Q3H PRN Toy Baker, MD   2 mg at 01/30/23 0438   ondansetron (ZOFRAN) injection 4 mg  4 mg Intravenous Q6H PRN Shelly Coss, MD   4 mg at 01/30/23 1038   oxyCODONE-acetaminophen (PERCOCET/ROXICET) 5-325 MG per tablet 2 tablet  2 tablet Oral Q4H PRN Nita Sells, MD   2 tablet at 01/30/23 0800   [  START ON 01/31/2023] PARoxetine (PAXIL) tablet 10 mg  10 mg Oral Daily Anaisa Radi A       polyethylene glycol (MIRALAX / GLYCOLAX) packet 17 g  17 g Oral Daily Shelly Coss, MD   17 g at 01/30/23 1114   risperiDONE (RISPERDAL M-TABS) disintegrating tablet 2 mg  2 mg Oral Q8H PRN Suella Broad, FNP       And   ziprasidone (GEODON) injection 20 mg  20 mg Intramuscular PRN Starkes-Perry, Gayland Curry,  FNP       sodium chloride flush (NS) 0.9 % injection 3 mL  3 mL Intravenous Q12H Doutova, Anastassia, MD   3 mL at 01/30/23 1036   sodium chloride flush (NS) 0.9 % injection 3 mL  3 mL Intravenous PRN Doutova, Anastassia, MD       thiamine (VITAMIN B1) tablet 100 mg  100 mg Oral Daily Doutova, Anastassia, MD   100 mg at 01/30/23 1114    Musculoskeletal: Strength & Muscle Tone: Laying in bed   Gait & Station: Laying in bed   Patient leans: Laying in bed     Psychiatric Specialty Exam:  Presentation  General Appearance:  Appropriate for Environment (jaundiced)  Eye Contact: Fair  Speech: Normal Rate  Speech Volume: Normal  Handedness: Right   Mood and Affect  Mood: -- (Upset, frustrated)  Affect: Full Range   Thought Process  Thought Processes: Linear  Descriptions of Associations:Intact  Orientation:Full (Time, Place and Person)  Thought Content: Suicidal History of Schizophrenia/Schizoaffective disorder:No  Duration of Psychotic Symptoms:No data recorded Hallucinations: denies Ideas of Reference: denies Suicidal Thoughts: Yes, denies plan  Homicidal Thoughts: denies   Sensorium  Memory: Immediate Good; Remote Good; Recent Good  Judgment: Poor  Insight: Fair   Community education officer  Concentration: Fair  Attention Span: Good  Recall: Good  Fund of Knowledge: Good  Language: Good   Psychomotor Activity  Psychomotor Activity: Psychomotor Activity: Normal  restless, rocking back and forth   Assets  Assets: Communication Skills; Desire for Improvement   Sleep  Sleep: Fair Sleep: Fair    Physical Exam: Minimal change since last exam Physical Exam Vitals reviewed.  HENT:     Head: Normocephalic and atraumatic.  Skin:    Coloration: Skin is jaundiced.  Neurological:     General: No focal deficit present.     Mental Status: He is alert and oriented to person, place, and time.  Psychiatric:        Attention and  Perception: He does not perceive auditory or visual hallucinations.        Mood and Affect: Mood is depressed.        Speech: Speech normal.        Behavior: Behavior is cooperative.        Thought Content: Thought content is not paranoid. Thought content includes suicidal ideation.        Judgment: Judgment is inappropriate. Judgment is not impulsive.    Review of Systems  Constitutional: Negative.   Psychiatric/Behavioral:  Positive for depression and suicidal ideas. The patient is nervous/anxious.    Blood pressure (!) 107/57, pulse (!) 57, temperature 98.9 F (37.2 C), temperature source Oral, resp. rate 19, height '5\' 9"'$  (1.753 m), weight 77.7 kg, SpO2 99 %. Body mass index is 25.3 kg/m.  Treatment Plan Summary:  Assessment: -MDD vs Bipoalr disorde r- pt consenting to paxil only at this time -GAD -PTSD -Hx of Alcohol use disorder  Plan: - Unable to reliably contract  for safety, will continue 1-1 sitter -Continue ivc - Continue recommendation for inpatient psych admission when medically cleared  -- start paxil  -consult SW to talk with patient, he would like some direction/assistance in regards to accessing social services  -Continue collaborating care with orthopedic for his hand, not surgical candidate, getting daily wound checks.  They will continue to observe for another 24 hours.  Psychiatry will continue to follow.   Disposition: Recommend psychiatric Inpatient admission when medically cleared. Supportive therapy provided about ongoing stressors.  Kerrie Buffalo Kramer Hanrahan 01/30/2023 4:32 PM

## 2023-01-31 LAB — COMPREHENSIVE METABOLIC PANEL
ALT: 22 U/L (ref 0–44)
AST: 58 U/L — ABNORMAL HIGH (ref 15–41)
Albumin: 2.4 g/dL — ABNORMAL LOW (ref 3.5–5.0)
Alkaline Phosphatase: 127 U/L — ABNORMAL HIGH (ref 38–126)
Anion gap: 9 (ref 5–15)
BUN: 19 mg/dL (ref 6–20)
CO2: 25 mmol/L (ref 22–32)
Calcium: 8.6 mg/dL — ABNORMAL LOW (ref 8.9–10.3)
Chloride: 99 mmol/L (ref 98–111)
Creatinine, Ser: 1.02 mg/dL (ref 0.61–1.24)
GFR, Estimated: >60 mL/min (ref >60)
Glucose, Bld: 99 mg/dL (ref 70–99)
Potassium: 4 mmol/L (ref 3.5–5.1)
Sodium: 133 mmol/L — ABNORMAL LOW (ref 135–145)
Total Bilirubin: 9.7 mg/dL — ABNORMAL HIGH (ref 0.3–1.2)
Total Protein: 6.9 g/dL (ref 6.5–8.1)

## 2023-01-31 LAB — CBC
HCT: 28.9 % — ABNORMAL LOW (ref 39.0–52.0)
Hemoglobin: 10.3 g/dL — ABNORMAL LOW (ref 13.0–17.0)
MCH: 36.7 pg — ABNORMAL HIGH (ref 26.0–34.0)
MCHC: 35.6 g/dL (ref 30.0–36.0)
MCV: 102.8 fL — ABNORMAL HIGH (ref 80.0–100.0)
Platelets: 97 10*3/uL — ABNORMAL LOW (ref 150–400)
RBC: 2.81 MIL/uL — ABNORMAL LOW (ref 4.22–5.81)
RDW: 18.4 % — ABNORMAL HIGH (ref 11.5–15.5)
WBC: 16.2 10*3/uL — ABNORMAL HIGH (ref 4.0–10.5)
nRBC: 0.1 % (ref 0.0–0.2)

## 2023-01-31 MED ORDER — SODIUM CHLORIDE 0.9 % IV SOLN
1.0000 g | INTRAVENOUS | Status: DC
  Administered 2023-01-31 – 2023-02-01 (×2): 1 g via INTRAVENOUS
  Filled 2023-01-31 (×2): qty 10

## 2023-01-31 MED ORDER — VANCOMYCIN HCL IN DEXTROSE 1-5 GM/200ML-% IV SOLN
1000.0000 mg | Freq: Two times a day (BID) | INTRAVENOUS | Status: DC
  Administered 2023-01-31 – 2023-02-05 (×11): 1000 mg via INTRAVENOUS
  Filled 2023-01-31 (×11): qty 200

## 2023-01-31 MED ORDER — VANCOMYCIN HCL 1500 MG/300ML IV SOLN
1500.0000 mg | Freq: Once | INTRAVENOUS | Status: AC
  Administered 2023-01-31: 1500 mg via INTRAVENOUS
  Filled 2023-01-31: qty 300

## 2023-01-31 NOTE — Progress Notes (Signed)
Pharmacy Antibiotic Note  Nathaniel Warren is a 57 y.o. male with hx EtOH and liver cirrhosis who presented to the ED on 01/22/2023 with c/o hand pain/swelling (s/p accidentally hit his right hand on 01/21/23) and groin swelling. Right hand CT on 2/16 showed  "diffuse severe subcutaneous soft tissue swelling/edema/fluid most notably involving the dorsum of the hand."  Pharmacy has been consulted  on 01/31/23 to dose vancomycin for suspected right hand infection and elevated wbc.   Today, 01/31/2023: - Tmax 99.8 - WBC up 16.2 - scr 1.02 (crcl~83)  Plan: - vancomycin 1500 mg IV x1, then 1000 mg q12h for est AUC 487 - ceftriaxone 1gm q24h per MD  ___________________________________________________  Height: '5\' 9"'$  ([id number] cm) Weight: 77.7 kg (171 lb 4.8 oz) IBW/kg (Calculated) : 70.7  Temp (24hrs), Avg:99.1 F (37.3 C), Min:98.7 F (37.1 C), Max:99.8 F (37.7 C)  Recent Labs  Lab 01/25/23 0329 01/26/23 0335 01/27/23 0400 01/28/23 0352 01/29/23 0404 01/31/23 0438  WBC 10.2  --   --  10.5 11.5* 16.2*  CREATININE  --  1.08 1.11 1.08 1.08 1.02    Estimated Creatinine Clearance: 82.8 mL/min (by C-G formula based on SCr of 1.02 mg/dL).    No Known Allergies  Thank you for allowing pharmacy to be a part of this patient's care.  Lynelle Doctor 01/31/2023 8:28 AM

## 2023-01-31 NOTE — Progress Notes (Signed)
PROGRESS NOTE  Nathaniel Warren  [id number] DOB: August 17, 1966 DOA: 01/22/2023 PCP: [name], NP   Brief Narrative: Patient is a 57 year old male with history of alcohol abuse/cirrhosis of liver, depression, chronic right ankle pain, suicidal attempts presented to the emergency department with complaint of left groin swelling, right arm swelling.  Found to have hematoma of the left groin as well as swelling of the right arm consistent with hematoma.  CT hand showed diffuse swelling without any abscess.  Due to suicidal ideation, psychiatry was consulted.  Hand surgery also consulted.  Swelling  improved.  Hematoma remains stable.  Given few days of vitamin K given this hospitalization for elevated INR.  PT/OT recommending acute inpatient rehab/SNF but looks difficult due to his psychiatric history.  Psychiatry recommending inpatient psychiatric admission.  TOC following  Assessment & Plan:  Principal Problem:   Spontaneous hematoma of lower leg Active Problems:   Alcoholic cirrhosis of liver with ascites (HCC)   Hyponatremia   Tobacco abuse   Suicidal ideations  Right arm/left groin swelling consistent with hematoma: Hand surgery was consulted and was following.  Improvement in the range of motion.  No indication for surgery for hematoma and expected slow reabsorption as per hand surgery.  Given few doses of vitamin K, FFP 2 units here.  Still complains of  right hand pain, right hand remains edematous.  He is able to move his fingers.  We requested Dr. Greta Doom for follow-up on 2/22 , recommendation is the same Continue elevation, ice application, pain medication. His bulla has ruptured.  He developed new leukocytosis.  No fever.  Will get blood cultures, started on broad spectrum antibiotics.  I have requested Dr. Greta Doom for follow-up again  Chronic pain syndrome: Has history of L5-S1 nerve compression with sciatica, chronic knee pain with foot drop.  Continue pain management, supportive  care.  PT/OT recommending AIR on discharge but looks difficult because of his psychiatric history.  He may need to increase his mobility while inpatient before being discharged to inpatient psychiatry Continue PT/OT therapy while inpatient  Spermatocele: Recommend ultrasound in 3 months  History of alcohol abuse/liver cirrhosis: Quit in 2023.  Elevated INR.  Given few days of vitamin K.  Has mild elevated liver enzymes along with bilirubin. Recommend to follow-up with GI/hepatology as an outpatient.Follows with [facility].  Has jaundice. On spironolactone Lasix at home, currently on hold due to low blood pressure  Macrocytic anemia/thrombocytopenia: This is secondary to chronic alcohol abuse, cirrhosis. continue thiamine /folic acid.  Hemoglobin stable.  Normal folate, vitamin B12 high  Nausea, vomiting: Had nausea with vomiting on 2/21.  Resolved after having a big bowel movement  Suicidal attempt/PTSD: Psychiatry following.  Current IVC.  Plan is to discharge to inpatient psychiatry when possible.            DVT prophylaxis:SCDs Start: 01/23/23 0025     Code Status: Full Code  Family Communication: None at the bedside  Patient status:Inpatient  Patient is from :Home  Anticipated discharge to: Inpatient psychiatry  Estimated DC date: Will wait for further improvement in the edema and pain of the right hand before discharge, leukocytosis should improve, needs clearance from hand surgery before discharge   Consultants: Hand surgery, psychiatry  Procedures: None  Antimicrobials:  Anti-infectives (From admission, onward)    Start     Dose/Rate Route Frequency Ordered Stop   01/31/23 2200  vancomycin (VANCOCIN) IVPB 1000 mg/200 mL premix        1,000  mg 200 mL/hr over 60 Minutes Intravenous Every 12 hours 01/31/23 0840     01/31/23 0900  cefTRIAXone (ROCEPHIN) 1 g in sodium chloride 0.9 % 100 mL IVPB        1 g 200 mL/hr over 30 Minutes Intravenous Every 24 hours  01/31/23 0811     01/31/23 0900  vancomycin (VANCOREADY) IVPB 1500 mg/300 mL        1,500 mg 150 mL/hr over 120 Minutes Intravenous  Once 01/31/23 0840         Subjective: Patient seen and examined at bedside today.  Hemodynamically stable.  Afebrile.  He said the pain is little better than yesterday but still persist.  The hemorrhagic bulla on the dorsum of the hand has ruptured   Objective: Vitals:   01/31/23 0322 01/31/23 0552 01/31/23 0826 01/31/23 0900  BP: 98/67 101/64  (!) 98/54  Pulse: 62 77 76 79  Resp:  (!) 21  14  Temp: 98.7 F (37.1 C) 98.9 F (37.2 C)  99 F (37.2 C)  TempSrc: Oral Oral  Oral  SpO2:  96%  94%  Weight:      Height:        Intake/Output Summary (Last 24 hours) at 01/31/2023 1140 Last data filed at 01/30/2023 2300 Gross per 24 hour  Intake 240 ml  Output 450 ml  Net -210 ml   Filed Weights   01/23/23 2007  Weight: 77.7 kg    Examination:   General exam: Overall comfortable, not in distress,icteric HEENT: PERRL Respiratory system:  no wheezes or crackles  Cardiovascular system: S1 & S2 heard, RRR.  Gastrointestinal system: Abdomen is nondistended, soft and nontender. Central nervous system: Alert and oriented Extremities: Edema of the right hand, right hand covered with dressing, ecchymosis, ruptured hemorrhagic bulla  skin: icterus   Data Reviewed: I have personally reviewed following labs and imaging studies  CBC: Recent Labs  Lab 01/25/23 0329 01/28/23 0352 01/29/23 0404 01/31/23 0438  WBC 10.2 10.5 11.5* 16.2*  NEUTROABS 4.9 5.1  --   --   HGB 10.1* 9.4* 9.0* 10.3*  HCT 28.2* 27.5* 25.6* 28.9*  MCV 102.5* 106.6* 104.1* 102.8*  PLT 83* 79* 80* 97*   Basic Metabolic Panel: Recent Labs  Lab 01/26/23 0335 01/27/23 0400 01/28/23 0352 01/29/23 0404 01/31/23 0438  NA 137 135 132* 134* 133*  K 4.5 4.1 4.1 3.8 4.0  CL 99 99 100 99 99  CO2 '31 28 26 26 25  '$ GLUCOSE 105* 95 105* 119* 99  BUN '20 16 15 19 19  '$ CREATININE  1.08 1.11 1.08 1.08 1.02  CALCIUM 8.9 8.7* 8.5* 8.7* 8.6*     No results found for this or any previous visit (from the past 240 hour(s)).   Radiology Studies: No results found.  Scheduled Meds:  folic acid  1 mg Oral Daily   PARoxetine  10 mg Oral Daily   polyethylene glycol  17 g Oral Daily   sodium chloride flush  3 mL Intravenous Q12H   thiamine  100 mg Oral Daily   Continuous Infusions:  sodium chloride     cefTRIAXone (ROCEPHIN)  IV 1 g (01/31/23 1040)   vancomycin     vancomycin 1,500 mg (01/31/23 1135)     LOS: 8 days   Shelly Coss, MD [facility] P2/24/2024, 11:40 AM

## 2023-02-01 ENCOUNTER — Inpatient Hospital Stay (HOSPITAL_COMMUNITY): Payer: Medicaid Other

## 2023-02-01 LAB — CBC
HCT: 27.5 % — ABNORMAL LOW (ref 39.0–52.0)
Hemoglobin: 9.5 g/dL — ABNORMAL LOW (ref 13.0–17.0)
MCH: 36.7 pg — ABNORMAL HIGH (ref 26.0–34.0)
MCHC: 34.5 g/dL (ref 30.0–36.0)
MCV: 106.2 fL — ABNORMAL HIGH (ref 80.0–100.0)
Platelets: 98 10*3/uL — ABNORMAL LOW (ref 150–400)
RBC: 2.59 MIL/uL — ABNORMAL LOW (ref 4.22–5.81)
RDW: 18.4 % — ABNORMAL HIGH (ref 11.5–15.5)
WBC: 13.5 10*3/uL — ABNORMAL HIGH (ref 4.0–10.5)
nRBC: 0.6 % — ABNORMAL HIGH (ref 0.0–0.2)

## 2023-02-01 MED ORDER — SODIUM CHLORIDE (PF) 0.9 % IJ SOLN
INTRAMUSCULAR | Status: AC
  Administered 2023-02-02: 10 mL
  Filled 2023-02-01: qty 50

## 2023-02-01 MED ORDER — SODIUM CHLORIDE 0.9 % IV SOLN
2.0000 g | INTRAVENOUS | Status: DC
  Administered 2023-02-02 – 2023-02-06 (×5): 2 g via INTRAVENOUS
  Filled 2023-02-01 (×5): qty 20

## 2023-02-01 MED ORDER — IOHEXOL 300 MG/ML  SOLN
100.0000 mL | Freq: Once | INTRAMUSCULAR | Status: AC | PRN
  Administered 2023-02-01: 75 mL via INTRAVENOUS

## 2023-02-01 MED ORDER — SODIUM CHLORIDE 0.9 % IV SOLN: 1.0000 g | Freq: Once | INTRAVENOUS | Status: DC

## 2023-02-01 NOTE — Progress Notes (Signed)
Occupational Therapy Treatment Patient Details Name: Nathaniel Warren MRN: [id number] DOB: 10-14-1966 Today's Date: 02/01/2023   History of present illness 57 yo male with onset of grion and RT dorsal hand bruising and edema with reported light trauma during a fall.  Has h/o R foot drop without AFO. + for suicidal ideations.   PMHx:  etoh abuse, depression with h/o 4 suicide attempts, stroke, seizures, liver failure, R ankle fracture with ORIF, atherosclerosis, cirrhosis, diverticulosis, osteopenia   OT comments  Treatment focused on edema reduction techniques and finger ROM. Bulla on dorsum of hand ruptured therefore that area not treated. Therapist encouraged higher elevation and continued squeezing movements to improve ROM and reduce edema. Patient's behavior during treatment completely appropriate and patient able to demonstrate how to passively range his digits. He is still in a quite a bit of pain. OT currently considering implementing an arm elevator sling but unsure if he will tolerate it - pending if it would provide too much or any compression of the dorsum of the hand. He is keeping his hand propped on pillows -- but the elevation achieved is minimal. OT will continue to follow.   Recommendations for follow up therapy are one component of a multi-disciplinary discharge planning process, led by the attending physician.  Recommendations may be updated based on patient status, additional functional criteria and insurance authorization.    Follow Up Recommendations  Skilled nursing-short term rehab (<3 hours/day)     Assistance Recommended at Discharge Frequent or constant Supervision/Assistance  Patient can return home with the following  A little help with bathing/dressing/bathroom;A little help with walking and/or transfers;Assistance with cooking/housework;Assist for transportation;Direct supervision/assist for medications management   Equipment Recommendations  None recommended by OT     Recommendations for Other Services      Precautions / Restrictions Precautions Precautions: Fall Precaution Comments: Suicide precautions, 1:1 sitter Restrictions Weight Bearing Restrictions: No Other Position/Activity Restrictions: Limited RT hand WB due to pain       Mobility Bed Mobility                    Transfers                         Balance                                           ADL either performed or assessed with clinical judgement   ADL                                              Extremity/Trunk Assessment              Vision       Perception     Praxis      Cognition Arousal/Alertness: Awake/alert Behavior During Therapy: WFL for tasks assessed/performed Overall Cognitive Status: Within Functional Limits for tasks assessed                                          Exercises Other Exercises Other Exercises: Manual techniques to each finger to reduce edema and then passive ROM to digits  2-5 x 5 reps. Other Exercises: Massage techniques to palm. Other Exercises: Encouarged wrist flexion and extension.    Shoulder Instructions       General Comments      Pertinent Vitals/ Pain       Pain Assessment Pain Assessment: Faces Faces Pain Scale: Hurts whole lot Pain Location: With finger ROM and pressure Pain Descriptors / Indicators: Discomfort, Squeezing, Grimacing, Guarding Pain Intervention(s): Limited activity within patient's tolerance  Home Living                                          Prior Functioning/Environment              Frequency           Progress Toward Goals  OT Goals(current goals can now be found in the care plan section)  Progress towards OT goals: Progressing toward goals  Acute Rehab OT Goals Patient Stated Goal: use this hand OT Goal Formulation: With patient Time For Goal Achievement:  02/09/23 Potential to Achieve Goals: Good  Plan Discharge plan remains appropriate;Discharge plan needs to be updated    Co-evaluation          OT goals addressed during session: Strengthening/ROM      AM-PAC OT "6 Clicks" Daily Activity     Outcome Measure   Help from another person eating meals?: A Little Help from another person taking care of personal grooming?: A Little Help from another person toileting, which includes using toliet, bedpan, or urinal?: A Little Help from another person bathing (including washing, rinsing, drying)?: A Little Help from another person to put on and taking off regular upper body clothing?: A Little Help from another person to put on and taking off regular lower body clothing?: A Little 6 Click Score: 18    End of Session    OT Visit Diagnosis: Pain;History of falling (Z91.81);Repeated falls (R29.6);Muscle weakness (generalized) (M62.81);Unsteadiness on feet (R26.81) Pain - Right/Left: Right Pain - part of body: Hand   Activity Tolerance Patient tolerated treatment well   Patient Left in bed;with call bell/phone within reach;with nursing/sitter in room   Nurse Communication Mobility status        Time: [id number] OT Time Calculation (min): 17 min  Charges: OT General Charges $OT Visit: 1 Visit OT Treatments $Therapeutic Exercise: 8-22 mins  Nathaniel Warren, OTR/L [place]  Office [phone]   Nathaniel Warren 02/01/2023, 1:29 PM

## 2023-02-01 NOTE — Consult Note (Signed)
[facility] Face-to-Face Psychiatry Consult    Patient Identification: Nathaniel Warren MRN:  [id number] Principal Diagnosis: Spontaneous hematoma of lower leg Diagnosis:  Principal Problem:   Spontaneous hematoma of lower leg Active Problems:   MDD (major depressive disorder), recurrent severe, without psychosis ([place])   Hyponatremia   Tobacco abuse   Alcoholic cirrhosis of liver with ascites ([place])   Suicidal ideations   Total Time spent with patient: 20 minutes  Subjective:   Nathaniel Warren is a 57 year old male with right hand injury, thigh hematoma.  he has a psychiatric h/o bipolar d/o vs mdd, gad, ptsd, alcohol use disorder.   Initial:  Nathaniel Warren is a 57 y.o. male patient admitted with .hx for polysubstance abuse, ETOH use, nerve compression L5-S1, MDD, suicide attempt x 4-who endorses suicidal ideations, and intent.  He is guarded and does not disclose the plan.  He is triggered by his chronic nerve pain, financial losses and medical comorbidities. Patient is disgruntled and frustrated throughout the majority of the evaluation. He ruminates and perseverates about multiple psychosocial stressors with a large contribution being finances, lack of access, and underlying mood disorder.  Patient with feelings of hopelessness and despair, impaired judgement and lack of insight presents an acutely high safety risk and continues to be appropriate for inpatient psychiatric admission where he can be monitored for safety and stabilized on medications. He meets criteria for IVC which will be upheld at this time.   Patient presents similarly to initial interview above. He was alert, oriented, and cooperative with interview. He continues to disagree with recommendation for inpt hospitalization but understands reasoning. Spent fair amt of time discussing systemic barriers to wellness. No real effect or side effect from paxil yet - moved to PM. Today he denied SI, but this was in context of discussing inpt  hospitalization. Noted to have broadening of affect, smiling and laughing at times. Agreeable to revisit on Monday. Aware of need for PT. No HI, AH/VH.     Spoke to friend Nathaniel Warren w/ pt consent He was aware that Nathaniel Warren was being seen by psychiatry. Caretaker is 1something years old, has been taking care of him for a year and a half. He has been generally laying at home, sick, refusing to take care of himself. He pays for most of pt's medications and needs out of his fixed income. He shares similar concerns about inability to get SSD. He does share that he was unable to pay for pt's mental health medications. He has mentioned suicide to Nathaniel Warren several times in the context of feeling like a burden and being better off dead. He has just now opened up to [place] about how he has been feeling. He had to force Alie to come to the hospital to start with, he is aware that probably doesn't have long to live regardless of medical intervention. He hasn't seen much of an improvement in pt's mental health, thinks he is putting on a front for mental health workers. He does not want Nathaniel Warren to leave the hospital until he is 100% stable because of how hard it was to get him to go to the hospital - had to threaten him with eviction. Friend asks about hospice. Nathaniel Warren thinks a psych hospital would be helpful  but understands Nathaniel Warren is not medically stable to think about that. Nathaniel Warren notes that Nathaniel Warren does take his medication and responds well when he can afford it. Has had at least 3 falls in the house.  Progress note: 02/01/23 57 y/o male patient seen face to face in his room. He was admitted due to suicidal ideations and intent. Patient is partially cooperative with evaluation, he is easily frustrated and gets overwhelm easily as well. He reports worsening depression characterized by hopelessness, low energy level, anhedonia, recurrent suicidal thoughts and feeling of worthlessness. Today, he denies delusions, psychosis but  refused to contract for safety. Patient still meet criteria for inpatient psychiatric admission.     Past Psychiatric History: bipolar d/o vs mdd, gad, ptsd, alcohol use disorder.   Risk to Self:  yes  Risk to Others:  impulsive but denying HI  Prior Inpatient Therapy:   Prior Outpatient Therapy:    Past Medical History:  Past Medical History:  Diagnosis Date   Depression    Hernia, abdominal    Liver failure (HCC)    Seizures ([place])    Stroke ([place])     Past Surgical History:  Procedure Laterality Date   ANKLE SURGERY     ANKLE SURGERY Right    HERNIA REPAIR     SPLENECTOMY     Family History:  Family History  Problem Relation Age of Onset   COPD Mother    Diabetes Father    Stroke Maternal Grandmother    Alzheimer's disease Maternal Grandfather    Cancer Paternal Grandmother    Stomach cancer Paternal Grandfather    Hypertension Other    Colon cancer Neg Hx    Esophageal cancer Neg Hx    Colon polyps Neg Hx    Inflammatory bowel disease Neg Hx    Liver disease Neg Hx    Pancreatic cancer Neg Hx    Rectal cancer Neg Hx    Family Psychiatric  History: did not report   Social History:  Social History   Substance and Sexual Activity  Alcohol Use Not Currently     Social History   Substance and Sexual Activity  Drug Use Yes   Types: Marijuana    Social History   Socioeconomic History   Marital status: Single    Spouse name: Not on file   Number of children: 2   Years of education: 14   Highest education level: Not on file  Occupational History   Occupation: Nature conservation officer  Tobacco Use   Smoking status: Every Day    Packs/day: 1.00    Years: 30.00    Total pack years: 30.00    Types: Cigarettes   Smokeless tobacco: Former  Scientific laboratory technician Use: Never used  Substance and Sexual Activity   Alcohol use: Not Currently   Drug use: Yes    Types: Marijuana   Sexual activity: Yes    Comment:  wife had hysterectomy  Other Topics Concern    Not on file  Social History Narrative   ** Merged History Encounter **    Right handed   Unable to work   Lives with roommate and cousin   Drinks caffeine   One floor house   Social Determinants of Health   Financial Resource Strain: High Risk (07/15/2022)   Overall Financial Resource Strain (CARDIA)    Difficulty of Paying Living Expenses: Very hard  Food Insecurity: Food Insecurity Present (01/23/2023)   Hunger Vital Sign    Worried About Running Out of Food in the Last Year: Sometimes true    Ran Out of Food in the Last Year: Sometimes true  Transportation Needs: Unmet Transportation Needs (01/23/2023)   PRAPARE - Transportation  Lack of Transportation (Medical): Yes    Lack of Transportation (Non-Medical): Yes  Physical Activity: Inactive (07/15/2022)   Exercise Vital Sign    Days of Exercise per Week: 0 days    Minutes of Exercise per Session: 0 min  Stress: Stress Concern Present (07/15/2022)   [place]    Feeling of Stress : Very much  Social Connections: Socially Isolated (07/15/2022)   Social Connection and Isolation Panel [NHANES]    Frequency of Communication with Friends and Family: More than three times a week    Frequency of Social Gatherings with Friends and Family: Once a week    Attends Religious Services: Never    Marine scientist or Organizations: No    Attends Archivist Meetings: Never    Marital Status: Divorced   Additional Social History:    Allergies:  No Known Allergies  Labs:  Results for orders placed or performed during the hospital encounter of 01/22/23 (from the past 48 hour(s))  Glucose, capillary     Status: Abnormal   Collection Time: 01/30/23  8:04 PM  Result Value Ref Range   Glucose-Capillary 101 (H) 70 - 99 mg/dL    Comment: Glucose reference range applies only to samples taken after fasting for at least 8 hours.  CBC     Status: Abnormal   Collection  Time: 01/31/23  4:38 AM  Result Value Ref Range   WBC 16.2 (H) 4.0 - 10.5 K/uL   RBC 2.81 (L) 4.22 - 5.81 MIL/uL   Hemoglobin 10.3 (L) 13.0 - 17.0 g/dL   HCT 28.9 (L) 39.0 - 52.0 %   MCV 102.8 (H) 80.0 - 100.0 fL   MCH 36.7 (H) 26.0 - 34.0 pg   MCHC 35.6 30.0 - 36.0 g/dL   RDW 18.4 (H) 11.5 - 15.5 %   Platelets 97 (L) 150 - 400 K/uL    Comment: SPECIMEN CHECKED FOR CLOTS Immature Platelet Fraction may be clinically indicated, consider ordering this additional test [id number] REPEATED TO VERIFY    nRBC 0.1 0.0 - 0.2 %    Comment: Performed at [facility], [place] [address], [place], [place] [address]  Comprehensive metabolic panel     Status: Abnormal   Collection Time: 01/31/23  4:38 AM  Result Value Ref Range   Sodium 133 (L) 135 - 145 mmol/L   Potassium 4.0 3.5 - 5.1 mmol/L   Chloride 99 98 - 111 mmol/L   CO2 25 22 - 32 mmol/L   Glucose, Bld 99 70 - 99 mg/dL    Comment: Glucose reference range applies only to samples taken after fasting for at least 8 hours.   BUN 19 6 - 20 mg/dL   Creatinine, Ser 1.02 0.61 - 1.24 mg/dL   Calcium 8.6 (L) 8.9 - 10.3 mg/dL   Total Protein 6.9 6.5 - 8.1 g/dL   Albumin 2.4 (L) 3.5 - 5.0 g/dL   AST 58 (H) 15 - 41 U/L   ALT 22 0 - 44 U/L   Alkaline Phosphatase 127 (H) 38 - 126 U/L   Total Bilirubin 9.7 (H) 0.3 - 1.2 mg/dL   GFR, Estimated >60 >60 mL/min    Comment: (NOTE) Calculated using the CKD-EPI Creatinine Equation (2021)    Anion gap 9 5 - 15    Comment: Performed at [facility], [place] [address] [place], [place] [address]  Culture, blood (Routine X 2) w Reflex to ID  Panel     Status: None (Preliminary result)   Collection Time: 01/31/23  8:45 AM   Specimen: BLOOD LEFT ARM  Result Value Ref Range   Specimen Description      BLOOD LEFT ARM Performed at [facility], [address] [place], [place] [address]    Special Requests      BOTTLES DRAWN AEROBIC AND ANAEROBIC Blood Culture adequate  volume Performed at [facility] [address], [place], [place] [address]    Culture      NO GROWTH < 24 HOURS Performed at [facility] [address] [place], [place] [address]    Report Status PENDING   Culture, blood (Routine X 2) w Reflex to ID Panel     Status: None (Preliminary result)   Collection Time: 01/31/23  8:46 AM   Specimen: BLOOD RIGHT HAND  Result Value Ref Range   Specimen Description      BLOOD RIGHT HAND Performed at [facility], [place] [address], [place], [place] [address]    Special Requests      BOTTLES DRAWN AEROBIC AND ANAEROBIC Blood Culture adequate volume Performed at [facility] [address], [place], [place] [address]    Culture      NO GROWTH < 24 HOURS Performed at [facility] [address], [place], [place] [address]    Report Status PENDING   CBC     Status: Abnormal   Collection Time: 02/01/23  4:14 AM  Result Value Ref Range   WBC 13.5 (H) 4.0 - 10.5 K/uL   RBC 2.59 (L) 4.22 - 5.81 MIL/uL   Hemoglobin 9.5 (L) 13.0 - 17.0 g/dL   HCT 27.5 (L) 39.0 - 52.0 %   MCV 106.2 (H) 80.0 - 100.0 fL   MCH 36.7 (H) 26.0 - 34.0 pg   MCHC 34.5 30.0 - 36.0 g/dL   RDW 18.4 (H) 11.5 - 15.5 %   Platelets 98 (L) 150 - 400 K/uL    Comment: Immature Platelet Fraction may be clinically indicated, consider ordering this additional test [id number] CONSISTENT WITH PREVIOUS RESULT    nRBC 0.6 (H) 0.0 - 0.2 %    Comment: Performed at [facility], [place] [address] [place], [place] [address]    Current Facility-Administered Medications  Medication Dose Route Frequency Provider Last Rate Last Admin   0.9 %  sodium chloride infusion  250 mL Intravenous PRN Doutova, Anastassia, MD       cefTRIAXone (ROCEPHIN) 1 g in sodium chloride 0.9 % 100 mL IVPB  1 g Intravenous Once Shelly Coss, MD       Derrill Memo ON 02/02/2023] cefTRIAXone (ROCEPHIN) 2 g in sodium chloride 0.9 % 100 mL IVPB   2 g Intravenous Q24H Adhikari, Amrit, MD       folic acid (FOLVITE) tablet 1 mg  1 mg Oral Daily Doutova, Anastassia, MD   1 mg at 02/01/23 0840   morphine (PF) 2 MG/ML injection 2 mg  2 mg Intravenous Q3H PRN Toy Baker, MD   2 mg at 02/01/23 0727   ondansetron (ZOFRAN) injection 4 mg  4 mg Intravenous Q6H PRN Shelly Coss, MD   4 mg at 01/30/23 1038   oxyCODONE-acetaminophen (PERCOCET/ROXICET) 5-325 MG per tablet 2 tablet  2 tablet Oral Q4H PRN Nita Sells, MD   2 tablet at 02/01/23 0503   PARoxetine (PAXIL) tablet 10 mg  10 mg Oral Daily Cinderella,  Margaret A   10 mg at 01/31/23 2051   polyethylene glycol (MIRALAX / GLYCOLAX) packet 17 g  17 g Oral Daily Shelly Coss, MD   17 g at 02/01/23 0840   risperiDONE (RISPERDAL M-TABS) disintegrating tablet 2 mg  2 mg Oral Q8H PRN Suella Broad, FNP       And   ziprasidone (GEODON) injection 20 mg  20 mg Intramuscular PRN Starkes-Perry, Gayland Curry, FNP       sodium chloride flush (NS) 0.9 % injection 3 mL  3 mL Intravenous Q12H Doutova, Anastassia, MD   3 mL at 01/31/23 2126   sodium chloride flush (NS) 0.9 % injection 3 mL  3 mL Intravenous PRN Toy Baker, MD       thiamine (VITAMIN B1) tablet 100 mg  100 mg Oral Daily Doutova, Anastassia, MD   100 mg at 02/01/23 0840   vancomycin (VANCOCIN) IVPB 1000 mg/200 mL premix  1,000 mg Intravenous Q12H Lynelle Doctor, RPH 200 mL/hr at 02/01/23 0935 1,000 mg at 02/01/23 0935    Musculoskeletal: Strength & Muscle Tone: Laying in bed   Gait & Station: Laying in bed   Patient leans: Laying in bed     Psychiatric Specialty Exam:  Presentation  General Appearance:  Appropriate for Environment (jaundiced)  Eye Contact: Fair  Speech: Normal Rate  Speech Volume: Normal  Handedness: Right   Mood and Affect  Mood: -- (Upset, frustrated)  Affect: Full Range   Thought Process  Thought Processes: Linear  Descriptions of  Associations:Intact  Orientation:Full (Time, Place and Person)  Thought Content: Suicidal History of Schizophrenia/Schizoaffective disorder:No  Duration of Psychotic Symptoms:No data recorded Hallucinations: denies Ideas of Reference: denies Suicidal Thoughts: Yes, denies plan  Homicidal Thoughts: denies   Sensorium  Memory: Immediate Good; Remote Good; Recent Good  Judgment: Poor  Insight: Fair   Community education officer  Concentration: Fair  Attention Span: Good  Recall: Good  Fund of Knowledge: Good  Language: Good   Psychomotor Activity  Psychomotor Activity: No data recorded  restless, rocking back and forth   Assets  Assets: Communication Skills; Desire for Improvement   Sleep  Sleep: Fair No data recorded    Physical Exam: Minimal change since last exam Physical Exam Vitals reviewed.  HENT:     Head: Normocephalic and atraumatic.  Skin:    Coloration: Skin is jaundiced.  Neurological:     General: No focal deficit present.     Mental Status: He is alert and oriented to person, place, and time.  Psychiatric:        Attention and Perception: He does not perceive auditory or visual hallucinations.        Mood and Affect: Mood is depressed.        Speech: Speech normal.        Behavior: Behavior is cooperative.        Thought Content: Thought content is not paranoid. Thought content includes suicidal ideation.        Judgment: Judgment is inappropriate. Judgment is not impulsive.    Review of Systems  Constitutional: Negative.   Psychiatric/Behavioral:  Positive for depression and suicidal ideas. The patient is nervous/anxious.    Blood pressure (!) 107/55, pulse 65, temperature 99.2 F (37.3 C), temperature source Oral, resp. rate 18, height '5\' 9"'$  (1.753 m), weight 77.7 kg, SpO2 100 %. Body mass index is 25.3 kg/m.  Treatment Plan Summary:  Assessment: -MDD vs Bipoalr disorde r- pt consenting to paxil only at  this  time -GAD -PTSD -Hx of Alcohol use disorder  Plan: - Unable to reliably contract for safety, will continue 1-1 sitter -Continue ivc - Continue recommendation for inpatient psych admission when medically cleared  -- Continue  paxil  -consult SW to talk with patient, he would like some direction/assistance in regards to accessing social services  -Continue collaborating care with orthopedic for his hand, not surgical candidate, getting daily wound checks.  They will continue to observe for another 24 hours.  Psychiatry will continue to follow.   Disposition: Recommend psychiatric Inpatient admission when medically cleared. Supportive therapy provided about ongoing stressors.  Corena Pilgrim, MD 02/01/2023 11:38 AM

## 2023-02-01 NOTE — Progress Notes (Signed)
PROGRESS NOTE  Nathaniel Warren  [id number] DOB: [birth year] DOA: 01/22/2023 PCP: [name], NP   Brief Narrative: Patient is a 57 year old male with history of alcohol abuse/cirrhosis of liver, depression, chronic right ankle pain, suicidal attempts presented to the emergency department with complaint of left groin swelling, right arm swelling.  Found to have hematoma of the left groin as well as swelling of the right arm consistent with hematoma.  CT hand showed diffuse swelling without any abscess.  Due to suicidal ideation, psychiatry was consulted.  Hand surgery also consulted.  Swelling  improved.  Hematoma remains stable.  Given few days of vitamin K given this hospitalization for elevated INR.  PT/OT recommending acute inpatient rehab/SNF but looks difficult due to his psychiatric history.  Psychiatry recommending inpatient psychiatric admission.  TOC following. Hospital course remarkable for persistent pain and swelling of the right hand  Assessment & Plan:  Principal Problem:   Spontaneous hematoma of lower leg Active Problems:   Alcoholic cirrhosis of liver with ascites (HCC)   Hyponatremia   Tobacco abuse   Suicidal ideations  Right arm/left groin swelling consistent with hematoma: Hand surgery was consulted and was following.  Improvement in the range of motion.  No indication for surgery for hematoma and expected slow reabsorption as per hand surgery.  Given few doses of vitamin K, FFP 2 units here.  Still complains of  right hand pain, right hand remains edematous.  He is able to move his fingers.  We requested Dr. Greta Doom for follow-up on 2/22 , recommendation is the same Continue elevation, ice application, pain medication. His bulla has ruptured.  He developed new leukocytosis.  No fever.  Sent blood cultures, started on broad spectrum antibiotics.  I have requested Dr. Greta Doom for follow-up again but he is not available on weekend.  Will check a follow-up CT scan of the  right hand.  Chronic pain syndrome: Has history of L5-S1 nerve compression with sciatica, chronic knee pain with foot drop.  Continue pain management, supportive care.  PT/OT recommending AIR on discharge but looks difficult because of his psychiatric history.  He may need to increase his mobility while inpatient before being discharged to inpatient psychiatry Continue PT/OT therapy while inpatient  Spermatocele: Recommend ultrasound in 3 months  History of alcohol abuse/liver cirrhosis: Quit in 2023.  Elevated INR.  Given few days of vitamin K.  Has mild elevated liver enzymes along with bilirubin. Recommend to follow-up with GI/hepatology as an outpatient.Follows with [facility].  Has jaundice. On spironolactone Lasix at home, currently on hold due to low blood pressure  Macrocytic anemia/thrombocytopenia: This is secondary to chronic alcohol abuse, cirrhosis. continue thiamine /folic acid.  Hemoglobin stable.  Normal folate, vitamin B12 high  Nausea, vomiting: Had nausea with vomiting on 2/21.  Resolved after having a big bowel movement  Suicidal attempt/PTSD: Psychiatry following.  Current IVC.  Plan is to discharge to inpatient psychiatry when possible.            DVT prophylaxis:SCDs Start: 01/23/23 0025     Code Status: Full Code  Family Communication: None at the bedside  Patient status:Inpatient  Patient is from :Home  Anticipated discharge to: Inpatient psychiatry  Estimated DC date: Will wait for further improvement in the edema and pain of the right hand before discharge, leukocytosis should improve, needs clearance from hand surgery before discharge   Consultants: Hand surgery, psychiatry  Procedures: None  Antimicrobials:  Anti-infectives (From admission, onward)    Start  Dose/Rate Route Frequency Ordered Stop   02/02/23 0900  cefTRIAXone (ROCEPHIN) 2 g in sodium chloride 0.9 % 100 mL IVPB        2 g 200 mL/hr over 30 Minutes Intravenous Every 24  hours 02/01/23 1044     01/31/23 2200  vancomycin (VANCOCIN) IVPB 1000 mg/200 mL premix        1,000 mg 200 mL/hr over 60 Minutes Intravenous Every 12 hours 01/31/23 0840     01/31/23 0900  cefTRIAXone (ROCEPHIN) 1 g in sodium chloride 0.9 % 100 mL IVPB  Status:  Discontinued        1 g 200 mL/hr over 30 Minutes Intravenous Every 24 hours 01/31/23 0811 02/01/23 1044   01/31/23 0900  vancomycin (VANCOREADY) IVPB 1500 mg/300 mL        1,500 mg 150 mL/hr over 120 Minutes Intravenous  Once 01/31/23 0840 01/31/23 1336       Subjective: Patient seen and examined at bedside today.  Hemodynamically stable.  Overall looks comfortable.  He says his right hand pain is not significantly bad.  Complains of left hand pain today.  Right hand examined at the bedside, still significantly edematous, cannot rule out underlying  abscess.  Doing CT hand  Objective: Vitals:   01/31/23 1300 01/31/23 2005 02/01/23 0444 02/01/23 0900  BP: (!) 97/57 (!) 94/52 (!) 100/57 (!) 107/55  Pulse: 62 67 72 65  Resp: '15 20 16 18  '$ Temp: 98.5 F (36.9 C) 98.9 F (37.2 C) 98.9 F (37.2 C) 99.2 F (37.3 C)  TempSrc: Oral Oral Oral Oral  SpO2: 98% 97% 96% 100%  Weight:      Height:        Intake/Output Summary (Last 24 hours) at 02/01/2023 1045 Last data filed at 02/01/2023 0600 Gross per 24 hour  Intake 1350.06 ml  Output 1025 ml  Net 325.06 ml   Filed Weights   01/23/23 2007  Weight: 77.7 kg    Examination:   General exam: Overall comfortable, not in distress,icteric HEENT: PERRL Respiratory system:  no wheezes or crackles  Cardiovascular system: S1 & S2 heard, RRR.  Gastrointestinal system: Abdomen is nondistended, soft and nontender. Central nervous system: Alert and oriented Extremities: Right hand hematoma/edema, ruptured bulla on the dorsum of the right hand, scattered ecchymosis Skin: No rashes, no ulcers,icteric    Data Reviewed: I have personally reviewed following labs and imaging  studies  CBC: Recent Labs  Lab 01/28/23 0352 01/29/23 0404 01/31/23 0438 02/01/23 0414  WBC 10.5 11.5* 16.2* 13.5*  NEUTROABS 5.1  --   --   --   HGB 9.4* 9.0* 10.3* 9.5*  HCT 27.5* 25.6* 28.9* 27.5*  MCV 106.6* 104.1* 102.8* 106.2*  PLT 79* 80* 97* 98*   Basic Metabolic Panel: Recent Labs  Lab 01/26/23 0335 01/27/23 0400 01/28/23 0352 01/29/23 0404 01/31/23 0438  NA 137 135 132* 134* 133*  K 4.5 4.1 4.1 3.8 4.0  CL 99 99 100 99 99  CO2 '31 28 26 26 25  '$ GLUCOSE 105* 95 105* 119* 99  BUN '20 16 15 19 19  '$ CREATININE 1.08 1.11 1.08 1.08 1.02  CALCIUM 8.9 8.7* 8.5* 8.7* 8.6*     Recent Results (from the past 240 hour(s))  Culture, blood (Routine X 2) w Reflex to ID Panel     Status: None (Preliminary result)   Collection Time: 01/31/23  8:45 AM   Specimen: BLOOD LEFT ARM  Result Value Ref Range Status   Specimen Description  Final    BLOOD LEFT ARM Performed at [facility], [place] [address], [place], [place] [address]    Special Requests   Final    BOTTLES DRAWN AEROBIC AND ANAEROBIC Blood Culture adequate volume Performed at [facility] [address] [place], [place] [address]    Culture   Final    NO GROWTH < 24 HOURS Performed at [facility] [address], [place], [place] [address]    Report Status PENDING  Incomplete  Culture, blood (Routine X 2) w Reflex to ID Panel     Status: None (Preliminary result)   Collection Time: 01/31/23  8:46 AM   Specimen: BLOOD RIGHT HAND  Result Value Ref Range Status   Specimen Description   Final    BLOOD RIGHT HAND Performed at [facility], [place] [address], [place], [place] [address]    Special Requests   Final    BOTTLES DRAWN AEROBIC AND ANAEROBIC Blood Culture adequate volume Performed at [facility] [address] [place], [place] [address]    Culture   Final    NO GROWTH < 24 HOURS Performed at [facility] [address], [name],  [place] [address]    Report Status PENDING  Incomplete     Radiology Studies: No results found.  Scheduled Meds:  folic acid  1 mg Oral Daily   PARoxetine  10 mg Oral Daily   polyethylene glycol  17 g Oral Daily   sodium chloride flush  3 mL Intravenous Q12H   thiamine  100 mg Oral Daily   Continuous Infusions:  sodium chloride     [START ON 02/02/2023] cefTRIAXone (ROCEPHIN)  IV     vancomycin 1,000 mg (02/01/23 0935)     LOS: 9 days   Shelly Coss, MD [facility] P2/25/2024, 10:45 AM

## 2023-02-02 ENCOUNTER — Inpatient Hospital Stay (HOSPITAL_COMMUNITY): Payer: Medicaid Other

## 2023-02-02 LAB — CBC
HCT: 26.7 % — ABNORMAL LOW (ref 39.0–52.0)
Hemoglobin: 9.2 g/dL — ABNORMAL LOW (ref 13.0–17.0)
MCH: 36.7 pg — ABNORMAL HIGH (ref 26.0–34.0)
MCHC: 34.5 g/dL (ref 30.0–36.0)
MCV: 106.4 fL — ABNORMAL HIGH (ref 80.0–100.0)
Platelets: 93 10*3/uL — ABNORMAL LOW (ref 150–400)
RBC: 2.51 MIL/uL — ABNORMAL LOW (ref 4.22–5.81)
RDW: 18.3 % — ABNORMAL HIGH (ref 11.5–15.5)
WBC: 12.5 10*3/uL — ABNORMAL HIGH (ref 4.0–10.5)
nRBC: 0.5 % — ABNORMAL HIGH (ref 0.0–0.2)

## 2023-02-02 LAB — BLOOD CULTURE ID PANEL (REFLEXED) - BCID2

## 2023-02-02 LAB — COMPREHENSIVE METABOLIC PANEL
ALT: 23 U/L (ref 0–44)
AST: 56 U/L — ABNORMAL HIGH (ref 15–41)
Albumin: 2.2 g/dL — ABNORMAL LOW (ref 3.5–5.0)
Alkaline Phosphatase: 127 U/L — ABNORMAL HIGH (ref 38–126)
Anion gap: 8 (ref 5–15)
BUN: 15 mg/dL (ref 6–20)
CO2: 24 mmol/L (ref 22–32)
Calcium: 8.4 mg/dL — ABNORMAL LOW (ref 8.9–10.3)
Chloride: 98 mmol/L (ref 98–111)
Creatinine, Ser: 0.89 mg/dL (ref 0.61–1.24)
GFR, Estimated: >60 mL/min (ref >60)
Glucose, Bld: 105 mg/dL — ABNORMAL HIGH (ref 70–99)
Potassium: 3.8 mmol/L (ref 3.5–5.1)
Sodium: 130 mmol/L — ABNORMAL LOW (ref 135–145)
Total Bilirubin: 7.5 mg/dL — ABNORMAL HIGH (ref 0.3–1.2)
Total Protein: 6.5 g/dL (ref 6.5–8.1)

## 2023-02-02 MED ORDER — QUETIAPINE FUMARATE 25 MG PO TABS
25.0000 mg | ORAL_TABLET | Freq: Every day | ORAL | Status: DC
  Administered 2023-02-02 – 2023-03-01 (×28): 25 mg via ORAL
  Filled 2023-02-02 (×29): qty 1

## 2023-02-02 NOTE — Consult Note (Signed)
[facility] Face-to-Face Psychiatry Consult    Patient Identification: Nathaniel Warren MRN:  [id number] Principal Diagnosis: Spontaneous hematoma of lower leg Diagnosis:  Principal Problem:   Spontaneous hematoma of lower leg Active Problems:   MDD (major depressive disorder), recurrent severe, without psychosis ([facility])   Hyponatremia   Tobacco abuse   Alcoholic cirrhosis of liver with ascites ([facility])   Suicidal ideations   Total Time spent with patient: 20 minutes  Subjective:   Nathaniel Warren is a 57 year old male with right hand injury, thigh hematoma.  he has a psychiatric h/o bipolar d/o vs mdd, gad, ptsd, alcohol use disorder.   Initial:  Nathaniel Warren is a 57 y.o. male patient admitted with .hx for polysubstance abuse, ETOH use, nerve compression L5-S1, MDD, suicide attempt x 4-who endorses suicidal ideations, and intent.  He is guarded and does not disclose the plan.  He is triggered by his chronic nerve pain, financial losses and medical comorbidities. Patient is disgruntled and frustrated throughout the majority of the evaluation. He ruminates and perseverates about multiple psychosocial stressors with a large contribution being finances, lack of access, and underlying mood disorder.  Patient with feelings of hopelessness and despair, impaired judgement and lack of insight presents an acutely high safety risk and continues to be appropriate for inpatient psychiatric admission where he can be monitored for safety and stabilized on medications. He meets criteria for IVC which will be upheld at this time.    He was alert, oriented, and cooperative with interview. He continues to disagree with recommendation for inpt hospitalization but understands initial reasoning. Notably over last 2 visits has denied suicidal ideation (although generally not contracting for safety) and denied again today. Spent most of visit discussing # of visitors he had over the weekend and how valued he felt. Sleeping  much better since initiation of paxil. Discussed hx bipolar - seems to have had genuine hypomanic episodes (decreased need for sleep, elevated mood, increased goal directed behavior, talkativneess) both before and after he was sober. Consented to lower dose of quetiapine after discussion of r/b/se. Noted to have ongoing broadening of affect, smiling and laughing at times. Aware of need for PT. No HI, AH/VH.     Spoke to friend Nathaniel Warren w/ pt consent 2/23 He was aware that Nathaniel Warren was being seen by psychiatry. Caretaker is 24something years old, has been taking care of him for a year and a half. He has been generally laying at home, sick, refusing to take care of himself. He pays for most of pt's medications and needs out of his fixed income. He shares similar concerns about inability to get SSD. He does share that he was unable to pay for pt's mental health medications. He has mentioned suicide to Nathaniel Warren several times in the context of feeling like a burden and being better off dead. He has just now opened up to Nathaniel Warren about how he has been feeling. He had to force Nathaniel Warren to come to the hospital to start with, he is aware that probably doesn't have long to live regardless of medical intervention. He hasn't seen much of an improvement in pt's mental health, thinks he is putting on a front for mental health workers. He does not want [name] to leave the hospital until he is 100% stable because of how hard it was to get him to go to the hospital - had to threaten him with eviction. Friend asks about hospice. Nathaniel Warren thinks a psych hospital would be helpful  but understands Nathaniel Warren is not medically stable to think about that. Nathaniel Warren notes that Nathaniel Warren does take his medication and responds well when he can afford it. Has had at least 3 falls in the house.     Past Psychiatric History: bipolar d/o vs mdd, gad, ptsd, alcohol use disorder.   Risk to Self:  yes  Risk to Others:  impulsive but denying HI  Prior Inpatient  Therapy:   Prior Outpatient Therapy:    Past Medical History:  Past Medical History:  Diagnosis Date   Depression    Hernia, abdominal    Liver failure (HCC)    Seizures ([place])    Stroke ([place])     Past Surgical History:  Procedure Laterality Date   ANKLE SURGERY     ANKLE SURGERY Right    HERNIA REPAIR     SPLENECTOMY     Family History:  Family History  Problem Relation Age of Onset   COPD Mother    Diabetes Father    Stroke Maternal Grandmother    Alzheimer's disease Maternal Grandfather    Cancer Paternal Grandmother    Stomach cancer Paternal Grandfather    Hypertension Other    Colon cancer Neg Hx    Esophageal cancer Neg Hx    Colon polyps Neg Hx    Inflammatory bowel disease Neg Hx    Liver disease Neg Hx    Pancreatic cancer Neg Hx    Rectal cancer Neg Hx    Family Psychiatric  History: did not report   Social History:  Social History   Substance and Sexual Activity  Alcohol Use Not Currently     Social History   Substance and Sexual Activity  Drug Use Yes   Types: Marijuana    Social History   Socioeconomic History   Marital status: Single    Spouse name: Not on file   Number of children: 2   Years of education: 14   Highest education level: Not on file  Occupational History   Occupation: Nature conservation officer  Tobacco Use   Smoking status: Every Day    Packs/day: 1.00    Years: 30.00    Total pack years: 30.00    Types: Cigarettes   Smokeless tobacco: Former  Scientific laboratory technician Use: Never used  Substance and Sexual Activity   Alcohol use: Not Currently   Drug use: Yes    Types: Marijuana   Sexual activity: Yes    Comment:  wife had hysterectomy  Other Topics Concern   Not on file  Social History Narrative   ** Merged History Encounter **    Right handed   Unable to work   Lives with roommate and cousin   Drinks caffeine   One floor house   Social Determinants of Health   Financial Resource Strain: High Risk (07/15/2022)    Overall Financial Resource Strain (CARDIA)    Difficulty of Paying Living Expenses: Very hard  Food Insecurity: Food Insecurity Present (01/23/2023)   Hunger Vital Sign    Worried About Running Out of Food in the Last Year: Sometimes true    Ran Out of Food in the Last Year: Sometimes true  Transportation Needs: Unmet Transportation Needs (01/23/2023)   PRAPARE - Transportation    Lack of Transportation (Medical): Yes    Lack of Transportation (Non-Medical): Yes  Physical Activity: Inactive (07/15/2022)   Exercise Vital Sign    Days of Exercise per Week: 0 days    Minutes of  Exercise per Session: 0 min  Stress: Stress Concern Present (07/15/2022)   [place]    Feeling of Stress : Very much  Social Connections: Socially Isolated (07/15/2022)   Social Connection and Isolation Panel [NHANES]    Frequency of Communication with Friends and Family: More than three times a week    Frequency of Social Gatherings with Friends and Family: Once a week    Attends Religious Services: Never    Marine scientist or Organizations: No    Attends Archivist Meetings: Never    Marital Status: Divorced   Additional Social History:    Allergies:  No Known Allergies  Labs:  Results for orders placed or performed during the hospital encounter of 01/22/23 (from the past 48 hour(s))  CBC     Status: Abnormal   Collection Time: 02/01/23  4:14 AM  Result Value Ref Range   WBC 13.5 (H) 4.0 - 10.5 K/uL   RBC 2.59 (L) 4.22 - 5.81 MIL/uL   Hemoglobin 9.5 (L) 13.0 - 17.0 g/dL   HCT 27.5 (L) 39.0 - 52.0 %   MCV 106.2 (H) 80.0 - 100.0 fL   MCH 36.7 (H) 26.0 - 34.0 pg   MCHC 34.5 30.0 - 36.0 g/dL   RDW 18.4 (H) 11.5 - 15.5 %   Platelets 98 (L) 150 - 400 K/uL    Comment: Immature Platelet Fraction may be clinically indicated, consider ordering this additional test [id number] CONSISTENT WITH PREVIOUS RESULT    nRBC 0.6 (H) 0.0 -  0.2 %    Comment: Performed at [facility], [place] [address], [place], [place] [address]  CBC     Status: Abnormal   Collection Time: 02/02/23  5:18 AM  Result Value Ref Range   WBC 12.5 (H) 4.0 - 10.5 K/uL   RBC 2.51 (L) 4.22 - 5.81 MIL/uL   Hemoglobin 9.2 (L) 13.0 - 17.0 g/dL   HCT 26.7 (L) 39.0 - 52.0 %   MCV 106.4 (H) 80.0 - 100.0 fL   MCH 36.7 (H) 26.0 - 34.0 pg   MCHC 34.5 30.0 - 36.0 g/dL   RDW 18.3 (H) 11.5 - 15.5 %   Platelets 93 (L) 150 - 400 K/uL    Comment: SPECIMEN CHECKED FOR CLOTS Immature Platelet Fraction may be clinically indicated, consider ordering this additional test [id number] CONSISTENT WITH PREVIOUS RESULT    nRBC 0.5 (H) 0.0 - 0.2 %    Comment: Performed at [facility], [place] [address] [place], [place] [address]  Comprehensive metabolic panel     Status: Abnormal   Collection Time: 02/02/23  5:18 AM  Result Value Ref Range   Sodium 130 (L) 135 - 145 mmol/L   Potassium 3.8 3.5 - 5.1 mmol/L   Chloride 98 98 - 111 mmol/L   CO2 24 22 - 32 mmol/L   Glucose, Bld 105 (H) 70 - 99 mg/dL    Comment: Glucose reference range applies only to samples taken after fasting for at least 8 hours.   BUN 15 6 - 20 mg/dL   Creatinine, Ser 0.89 0.61 - 1.24 mg/dL   Calcium 8.4 (L) 8.9 - 10.3 mg/dL   Total Protein 6.5 6.5 - 8.1 g/dL   Albumin 2.2 (L) 3.5 - 5.0 g/dL   AST 56 (H) 15 - 41 U/L   ALT 23 0 - 44 U/L   Alkaline Phosphatase 127 (H) 38 - 126 U/L  Total Bilirubin 7.5 (H) 0.3 - 1.2 mg/dL   GFR, Estimated >60 >60 mL/min    Comment: (NOTE) Calculated using the CKD-EPI Creatinine Equation (2021)    Anion gap 8 5 - 15    Comment: Performed at [facility], [place] [address], [place], [place] [address]    Current Facility-Administered Medications  Medication Dose Route Frequency Provider Last Rate Last Admin   0.9 %  sodium chloride infusion  250 mL Intravenous PRN Toy Baker, MD       cefTRIAXone  (ROCEPHIN) 2 g in sodium chloride 0.9 % 100 mL IVPB  2 g Intravenous Q24H Shelly Coss, MD   Stopped at 0000000 [id number]   folic acid (FOLVITE) tablet 1 mg  1 mg Oral Daily Doutova, Nyoka Lint, MD   1 mg at 02/02/23 [id number]   morphine (PF) 2 MG/ML injection 2 mg  2 mg Intravenous Q3H PRN Toy Baker, MD   2 mg at 02/01/23 2224   ondansetron (ZOFRAN) injection 4 mg  4 mg Intravenous Q6H PRN Shelly Coss, MD   4 mg at 01/30/23 1038   oxyCODONE-acetaminophen (PERCOCET/ROXICET) 5-325 MG per tablet 2 tablet  2 tablet Oral Q4H PRN Nita Sells, MD   2 tablet at 02/02/23 1536   PARoxetine (PAXIL) tablet 10 mg  10 mg Oral Daily Roselin Wiemann A   10 mg at 02/01/23 2113   polyethylene glycol (MIRALAX / GLYCOLAX) packet 17 g  17 g Oral Daily Shelly Coss, MD   17 g at 02/01/23 0840   risperiDONE (RISPERDAL M-TABS) disintegrating tablet 2 mg  2 mg Oral Q8H PRN Suella Broad, FNP       And   ziprasidone (GEODON) injection 20 mg  20 mg Intramuscular PRN Starkes-Perry, Gayland Curry, FNP       sodium chloride flush (NS) 0.9 % injection 3 mL  3 mL Intravenous Q12H Doutova, Anastassia, MD   3 mL at 02/01/23 2200   sodium chloride flush (NS) 0.9 % injection 3 mL  3 mL Intravenous PRN Doutova, Anastassia, MD       thiamine (VITAMIN B1) tablet 100 mg  100 mg Oral Daily Doutova, Anastassia, MD   100 mg at 02/02/23 0903   vancomycin (VANCOCIN) IVPB 1000 mg/200 mL premix  1,000 mg Intravenous Q12H Lynelle Doctor, RPH   Stopped at 02/02/23 1021    Musculoskeletal: Strength & Muscle Tone: Laying in bed   Gait & Station: Laying in bed   Patient leans: Laying in bed     Psychiatric Specialty Exam:  Presentation  General Appearance:  -- (jaundiced)  Eye Contact: Fair  Speech: Normal Rate  Speech Volume: Normal  Handedness: Ambidextrous   Mood and Affect  Mood: -- (A little better)  Affect: Full Range   Thought Process  Thought Processes: Linear  Descriptions of  Associations:Intact  Orientation:Full (Time, Place and Person)  Thought Content: Suicidal History of Schizophrenia/Schizoaffective disorder:No  Duration of Psychotic Symptoms:No data recorded Hallucinations: denies Ideas of Reference: denies Suicidal Thoughts: Yes, denies plan  Homicidal Thoughts: denies   Sensorium  Memory: Immediate Good; Recent Good; Remote Good  Judgment: Fair  Insight: Fair   Community education officer  Concentration: Fair  Attention Span: Good  Recall: Good  Fund of Knowledge: Good  Language: Good   Psychomotor Activity  Psychomotor Activity: Psychomotor Activity: Normal   restless, rocking back and forth   Assets  Assets: Communication Skills; Desire for Improvement   Sleep  Sleep: Fair Sleep: Good  Physical Exam: Minimal change since last exam Physical Exam Vitals reviewed.  HENT:     Head: Normocephalic and atraumatic.  Skin:    Coloration: Skin is jaundiced.  Neurological:     General: No focal deficit present.     Mental Status: He is alert and oriented to person, place, and time.  Psychiatric:        Attention and Perception: He does not perceive auditory or visual hallucinations.        Mood and Affect: Mood is depressed.        Speech: Speech normal.        Behavior: Behavior is cooperative.        Thought Content: Thought content is not paranoid.        Judgment: Judgment is not impulsive.    Review of Systems  Constitutional: Negative.   Psychiatric/Behavioral:  Positive for depression and suicidal ideas. The patient is nervous/anxious.    Blood pressure 131/64, pulse 66, temperature 98.5 F (36.9 C), temperature source Oral, resp. rate 18, height '5\' 9"'$  (1.753 m), weight 77.7 kg, SpO2 97 %. Body mass index is 25.3 kg/m.  Treatment Plan Summary:  Assessment: -- likely bipolar II  -GAD -PTSD -Hx of Alcohol use disorder  Notably, will likely require lower doses of psychotropic meds which are  mostly metabolized by CYP system. Still on IV abx so not appropriate for most inpt psych units. Seems to have some improvement since about Th 2/22, will continue to revisit decision for inpt psych (possible he will no longer require it when ultimately medically stable)   Plan: --  Unable to reliably contract for safety, will continue 1-1 sitter -- Continue ivc -- Continue recommendation for inpatient psych admission when medically cleared  -- c paxil 10 mg -- s quetiapine 25 mg QHS -consult SW to talk with patient, he would like some direction/assistance in regards to accessing social services  -Continue collaborating care with orthopedic for his hand, not surgical candidate, getting daily wound checks.   Psychiatry will continue to follow.   Disposition: Recommend psychiatric Inpatient admission when medically cleared. Supportive therapy provided about ongoing stressors.  Kerrie Buffalo Ritchie Klee 02/02/2023 4:02 PM

## 2023-02-02 NOTE — Progress Notes (Addendum)
PROGRESS NOTE  Nathaniel Warren  [id number] DOB: 12-18-1965 DOA: 01/22/2023 PCP: [name], NP   Brief Narrative: Patient is a 57 year old male with history of alcohol abuse/cirrhosis of liver, depression, chronic right ankle pain, suicidal attempts presented to the emergency department with complaint of left groin swelling, right arm swelling.  Found to have hematoma of the left groin as well as swelling of the right arm consistent with hematoma.  CT hand showed diffuse swelling without any abscess.  Due to suicidal ideation, psychiatry was consulted.  Hand surgery also consulted.  Swelling  improved.  Hematoma remains stable.  Given few days of vitamin K given this hospitalization for elevated INR.  PT/OT recommending acute inpatient rehab/SNF but looks difficult due to his psychiatric history.  Psychiatry recommending inpatient psychiatric admission.  TOC following. Hospital course remarkable for persistent pain and swelling of the right hand,finding of necrotic left 4th finger tip.  Hand surgery following  Assessment & Plan:  Principal Problem:   Spontaneous hematoma of lower leg Active Problems:   Alcoholic cirrhosis of liver with ascites (HCC)   MDD (major depressive disorder), recurrent severe, without psychosis ([place])   Hyponatremia   Tobacco abuse   Suicidal ideations  Right arm/left groin swelling consistent with hematoma/necrotic left 4th finger tip: Hand surgery was consulted and was following.  Improvement in the range of motion.  No indication for surgery for hematoma and expected slow reabsorption as per hand surgery.  Given few doses of vitamin K, FFP 2 units here. Pain is better.  He is able to move his fingers.  We requested Dr. Greta Doom for follow-up on 2/22 , recommendation is the same Continue elevation, ice application, pain medication. His bulla has ruptured.  He developed new leukocytosis now improving.  No fever.  Sent blood cultures, started on broad spectrum  antibiotics: NGTD.  Follow-up CT scan of the right hand done on 2/25 without evidence of abscess or collection. He was also found to have worsening of the ulcer on the left fourth fingertip, now appears necrotic, foul-smelling.  I have requested Dr. Greta Doom for follow-up on this  . Will check an xray  Chronic pain syndrome: Has history of L5-S1 nerve compression with sciatica, chronic knee pain with foot drop.  Continue pain management, supportive care.  PT/OT recommending AIR on discharge but looks difficult because of his psychiatric history.  He may need to increase his mobility while inpatient before being discharged to inpatient psychiatry Continue PT/OT therapy while inpatient  Spermatocele: Recommend ultrasound in 3 months  History of alcohol abuse/liver cirrhosis: Quit in 2023.  Elevated INR.  Given few days of vitamin K.  Has mild elevated liver enzymes along with bilirubin. Recommend to follow-up with GI/hepatology as an outpatient.Follows with [facility].  Has jaundice. On spironolactone Lasix at home, currently on hold due to low blood pressure.  Appears euvolemic  Macrocytic anemia/thrombocytopenia: This is secondary to chronic alcohol abuse, cirrhosis. continue thiamine /folic acid.  Hemoglobin stable.  Normal folate, vitamin B12 high  Nausea, vomiting: Had nausea with vomiting on 2/21.  Resolved after having a big bowel movement  Suicidal attempt/PTSD: Psychiatry following.  Current IVC.  Plan is to discharge to inpatient psychiatry when possible.            DVT prophylaxis:SCDs Start: 01/23/23 0025     Code Status: Full Code  Family Communication: None at the bedside  Patient status:Inpatient  Patient is from :Home  Anticipated discharge to: Inpatient psychiatry  Estimated DC  date: Will wait for further improvement in the edema and pain of the right hand before discharge,  hand surgery evaluating necrotic tip of left 4th finger today   Consultants: Hand surgery,  psychiatry  Procedures: Bedside I and D  Antimicrobials:  Anti-infectives (From admission, onward)    Start     Dose/Rate Route Frequency Ordered Stop   02/02/23 0900  cefTRIAXone (ROCEPHIN) 2 g in sodium chloride 0.9 % 100 mL IVPB        2 g 200 mL/hr over 30 Minutes Intravenous Every 24 hours 02/01/23 1044     02/01/23 1200  cefTRIAXone (ROCEPHIN) 1 g in sodium chloride 0.9 % 100 mL IVPB  Status:  Discontinued        1 g 200 mL/hr over 30 Minutes Intravenous  Once 02/01/23 1101 02/01/23 1150   01/31/23 2200  vancomycin (VANCOCIN) IVPB 1000 mg/200 mL premix        1,000 mg 200 mL/hr over 60 Minutes Intravenous Every 12 hours 01/31/23 0840     01/31/23 0900  cefTRIAXone (ROCEPHIN) 1 g in sodium chloride 0.9 % 100 mL IVPB  Status:  Discontinued        1 g 200 mL/hr over 30 Minutes Intravenous Every 24 hours 01/31/23 0811 02/01/23 1044   01/31/23 0900  vancomycin (VANCOREADY) IVPB 1500 mg/300 mL        1,500 mg 150 mL/hr over 120 Minutes Intravenous  Once 01/31/23 0840 01/31/23 1336       Subjective: Patient seen and examined at bedside today.  Hemodynamically stable.  Doing puzzles at bedside.  He states his right hand pain is better with improved swelling. Found that  he has necrotic left fourth fingertip which is also foul-smelling.  Objective: Vitals:   02/01/23 1727 02/01/23 2054 02/01/23 2257 02/02/23 0418  BP:  (!) 100/56 (!) 120/55 (!) 128/59  Pulse:  62 71 72  Resp:  '20 19 18  '$ Temp: 99.2 F (37.3 C) 98.4 F (36.9 C) 98.3 F (36.8 C) 98.6 F (37 C)  TempSrc:  Oral Oral Oral  SpO2:  97% 97% 96%  Weight:      Height:        Intake/Output Summary (Last 24 hours) at 02/02/2023 1048 Last data filed at 02/02/2023 0820 Gross per 24 hour  Intake 484.54 ml  Output 600 ml  Net -115.46 ml   Filed Weights   01/23/23 2007  Weight: 77.7 kg    Examination:   General exam: Overall comfortable, not in distress,icteric HEENT: PERRL Respiratory system:  no wheezes or  crackles  Cardiovascular system: S1 & S2 heard, RRR.  Gastrointestinal system: Abdomen is nondistended, soft and nontender. Central nervous system: Alert and oriented Extremities: Ecchymosis/swelling of the right hand with ruptured bulla on the dorsum, necrotic foul-smelling left fourth finger with minimal discharge Skin: Icterus     Data Reviewed: I have personally reviewed following labs and imaging studies  CBC: Recent Labs  Lab 01/28/23 0352 01/29/23 0404 01/31/23 0438 02/01/23 0414 02/02/23 0518  WBC 10.5 11.5* 16.2* 13.5* 12.5*  NEUTROABS 5.1  --   --   --   --   HGB 9.4* 9.0* 10.3* 9.5* 9.2*  HCT 27.5* 25.6* 28.9* 27.5* 26.7*  MCV 106.6* 104.1* 102.8* 106.2* 106.4*  PLT 79* 80* 97* 98* 93*   Basic Metabolic Panel: Recent Labs  Lab 01/27/23 0400 01/28/23 0352 01/29/23 0404 01/31/23 0438 02/02/23 0518  NA 135 132* 134* 133* 130*  K 4.1 4.1 3.8 4.0  3.8  CL 99 100 99 99 98  CO2 '28 26 26 25 24  '$ GLUCOSE 95 105* 119* 99 105*  BUN '16 15 19 19 15  '$ CREATININE 1.11 1.08 1.08 1.02 0.89  CALCIUM 8.7* 8.5* 8.7* 8.6* 8.4*     Recent Results (from the past 240 hour(s))  Culture, blood (Routine X 2) w Reflex to ID Panel     Status: None (Preliminary result)   Collection Time: 01/31/23  8:45 AM   Specimen: BLOOD LEFT ARM  Result Value Ref Range Status   Specimen Description   Final    BLOOD LEFT ARM Performed at [facility], [address] [place], [place] [address]    Special Requests   Final    BOTTLES DRAWN AEROBIC AND ANAEROBIC Blood Culture adequate volume Performed at [facility] [address], [place], [place] [address]    Culture   Final    NO GROWTH 2 DAYS Performed at [facility] [address], [place], [place] [address]    Report Status PENDING  Incomplete  Culture, blood (Routine X 2) w Reflex to ID Panel     Status: None (Preliminary result)   Collection Time: 01/31/23  8:46 AM   Specimen: BLOOD RIGHT HAND   Result Value Ref Range Status   Specimen Description   Final    BLOOD RIGHT HAND Performed at [facility], [place] [address], [place], [place] [address]    Special Requests   Final    BOTTLES DRAWN AEROBIC AND ANAEROBIC Blood Culture adequate volume Performed at [facility] [address], [place], [place] [address]    Culture   Final    NO GROWTH 2 DAYS Performed at [facility] [address], [place], [place] [address]    Report Status PENDING  Incomplete     Radiology Studies: CT HAND RIGHT W CONTRAST  Result Date: 02/01/2023 CLINICAL DATA:  Right hand soft tissue infection, right hand pain, swelling EXAM: CT OF THE UPPER RIGHT EXTREMITY WITH CONTRAST TECHNIQUE: Multidetector CT imaging of the upper right extremity was performed according to the standard protocol following intravenous contrast administration. RADIATION DOSE REDUCTION: This exam was performed according to the departmental dose-optimization program which includes automated exposure control, adjustment of the mA and/or kV according to patient size and/or use of iterative reconstruction technique. CONTRAST:  18m OMNIPAQUE IOHEXOL 300 MG/ML  SOLN COMPARISON:  01/23/2023 FINDINGS: Bones/Joint/Cartilage Remote healed left fifth metacarpal fracture again noted. No acute fracture or dislocation identified. No lytic or blastic bone lesion. Joint spaces are preserved. Ligaments Suboptimally assessed by CT. Muscles and Tendons Unremarkable Soft tissues There is marked dorsal soft tissue swelling again noted. And increasingly organized hyperdense fluid collection is seen within the dorsal soft tissues superficial to the extensor tendons measuring roughly 2.5 x 5.0 x 7.5 cm in greatest dimension on axial image # 75/8 and sagittal image # 45/10 in keeping with a probable evolving subcutaneous hematoma. No subcutaneous gas identified. No retained radiopaque foreign body. IMPRESSION: 1. Marked dorsal soft  tissue swelling with increasingly organized hyperdense fluid collection within the dorsal soft tissues superficial to the extensor tendons in keeping with a probable evolving subcutaneous hematoma. Note that super infection is not excluded on this examination alone. No subcutaneous gas identified. No retained radiopaque foreign body. 2. No acute fracture or dislocation. 3. Remote healed left fifth metacarpal fracture. Electronically Signed   By: ALinwood DibblesD.  On: 02/01/2023 23:13    Scheduled Meds:  folic acid  1 mg Oral Daily   PARoxetine  10 mg Oral Daily   polyethylene glycol  17 g Oral Daily   sodium chloride flush  3 mL Intravenous Q12H   thiamine  100 mg Oral Daily   Continuous Infusions:  sodium chloride     cefTRIAXone (ROCEPHIN)  IV 2 g (02/02/23 0811)   vancomycin 1,000 mg (02/02/23 0903)     LOS: 10 days   Shelly Coss, MD [facility] P2/26/2024, 10:48 AM

## 2023-02-02 NOTE — Progress Notes (Signed)
PHARMACY - PHYSICIAN COMMUNICATION CRITICAL VALUE ALERT - BLOOD CULTURE IDENTIFICATION (BCID)  Nathaniel Warren is an 57 y.o. male who presented to [facility] on 01/22/2023 with a chief complaint of  left groin swelling, right arm swelling.   Assessment:  1/4 bottles - anaerobic bottle - strep species - possible contaminant  Name of physician (or Provider) Contacted: Adhikari  Current antibiotics: ceftriaxone & vancomycin for R hand swelling/infection.  Changes to prescribed antibiotics recommended:  Patient is on recommended antibiotics - No changes needed  Results for orders placed or performed during the hospital encounter of 01/22/23  Blood Culture ID Panel (Reflexed) (Collected: 01/31/2023  8:46 AM)  Result Value Ref Range   Enterococcus faecalis NOT DETECTED NOT DETECTED   Enterococcus Faecium NOT DETECTED NOT DETECTED   Listeria monocytogenes NOT DETECTED NOT DETECTED   Staphylococcus species NOT DETECTED NOT DETECTED   Staphylococcus aureus (BCID) NOT DETECTED NOT DETECTED   Staphylococcus epidermidis NOT DETECTED NOT DETECTED   Staphylococcus lugdunensis NOT DETECTED NOT DETECTED   Streptococcus species DETECTED (A) NOT DETECTED   Streptococcus agalactiae NOT DETECTED NOT DETECTED   Streptococcus pneumoniae NOT DETECTED NOT DETECTED   Streptococcus pyogenes NOT DETECTED NOT DETECTED   A.calcoaceticus-baumannii NOT DETECTED NOT DETECTED   Bacteroides fragilis NOT DETECTED NOT DETECTED   Enterobacterales NOT DETECTED NOT DETECTED   Enterobacter cloacae complex NOT DETECTED NOT DETECTED   Escherichia coli NOT DETECTED NOT DETECTED   Klebsiella aerogenes NOT DETECTED NOT DETECTED   Klebsiella oxytoca NOT DETECTED NOT DETECTED   Klebsiella pneumoniae NOT DETECTED NOT DETECTED   Proteus species NOT DETECTED NOT DETECTED   Salmonella species NOT DETECTED NOT DETECTED   Serratia marcescens NOT DETECTED NOT DETECTED   Haemophilus influenzae NOT DETECTED NOT DETECTED   Neisseria  meningitidis NOT DETECTED NOT DETECTED   Pseudomonas aeruginosa NOT DETECTED NOT DETECTED   Stenotrophomonas maltophilia NOT DETECTED NOT DETECTED   Candida albicans NOT DETECTED NOT DETECTED   Candida auris NOT DETECTED NOT DETECTED   Candida glabrata NOT DETECTED NOT DETECTED   Candida krusei NOT DETECTED NOT DETECTED   Candida parapsilosis NOT DETECTED NOT DETECTED   Candida tropicalis NOT DETECTED NOT DETECTED   Cryptococcus neoformans/gattii NOT DETECTED NOT DETECTED    [name], Pharm.D Use secure chat for questions 02/02/2023 1:10 PM

## 2023-02-02 NOTE — Progress Notes (Signed)
PT Cancellation Note  Patient Details Name: Nathaniel Warren MRN: [id number] DOB: [birth year]   Cancelled Treatment:    Reason Eval/Treat Not Completed:  Attempted PT tx session-pt declined to participate with therapy on today. Will check back on tomorrow. If pt continues to refuse, will sign off.    [facility]  Office: [phone]

## 2023-02-03 ENCOUNTER — Inpatient Hospital Stay (HOSPITAL_COMMUNITY): Payer: Medicaid Other

## 2023-02-03 ENCOUNTER — Inpatient Hospital Stay (HOSPITAL_COMMUNITY): Payer: Medicaid Other | Admitting: Anesthesiology

## 2023-02-03 ENCOUNTER — Encounter (HOSPITAL_COMMUNITY): Payer: Self-pay | Admitting: Family Medicine

## 2023-02-03 ENCOUNTER — Encounter (HOSPITAL_COMMUNITY)
Admission: EM | Disposition: A | Discharge status: Home / Self Care | Payer: Self-pay | Source: Home / Self Care | Attending: Internal Medicine

## 2023-02-03 DIAGNOSIS — Z8673 Personal history of transient ischemic attack (TIA), and cerebral infarction without residual deficits: Secondary | ICD-10-CM

## 2023-02-03 DIAGNOSIS — F17210 Nicotine dependence, cigarettes, uncomplicated: Secondary | ICD-10-CM

## 2023-02-03 DIAGNOSIS — D649 Anemia, unspecified: Secondary | ICD-10-CM

## 2023-02-03 DIAGNOSIS — L089 Local infection of the skin and subcutaneous tissue, unspecified: Secondary | ICD-10-CM

## 2023-02-03 HISTORY — PX: INCISION AND DRAINAGE OF WOUND: SHX1803

## 2023-02-03 LAB — CBC
HCT: 26 % — ABNORMAL LOW (ref 39.0–52.0)
Hemoglobin: 9.1 g/dL — ABNORMAL LOW (ref 13.0–17.0)
MCH: 36.5 pg — ABNORMAL HIGH (ref 26.0–34.0)
MCHC: 35 g/dL (ref 30.0–36.0)
MCV: 104.4 fL — ABNORMAL HIGH (ref 80.0–100.0)
Platelets: 95 10*3/uL — ABNORMAL LOW (ref 150–400)
RBC: 2.49 MIL/uL — ABNORMAL LOW (ref 4.22–5.81)
RDW: 18 % — ABNORMAL HIGH (ref 11.5–15.5)
WBC: 12.1 10*3/uL — ABNORMAL HIGH (ref 4.0–10.5)
nRBC: 0.3 % — ABNORMAL HIGH (ref 0.0–0.2)

## 2023-02-03 LAB — PROTIME-INR
INR: 1.8 — ABNORMAL HIGH (ref 0.8–1.2)
Prothrombin Time: 20.3 seconds — ABNORMAL HIGH (ref 11.4–15.2)

## 2023-02-03 SURGERY — IRRIGATION AND DEBRIDEMENT WOUND
Anesthesia: General | Laterality: Left

## 2023-02-03 MED ORDER — EPHEDRINE 5 MG/ML INJ
INTRAVENOUS | Status: AC
  Filled 2023-02-03: qty 5

## 2023-02-03 MED ORDER — LIDOCAINE HCL 1 % IJ SOLN
INTRAMUSCULAR | Status: DC | PRN
  Administered 2023-02-03: 5 mL

## 2023-02-03 MED ORDER — SUCCINYLCHOLINE CHLORIDE 200 MG/10ML IV SOSY
PREFILLED_SYRINGE | INTRAVENOUS | Status: DC | PRN
  Administered 2023-02-03: 120 mg via INTRAVENOUS

## 2023-02-03 MED ORDER — LACTATED RINGERS IV SOLN: INTRAVENOUS | Status: DC

## 2023-02-03 MED ORDER — LORAZEPAM 1 MG PO TABS
1.0000 mg | ORAL_TABLET | Freq: Once | ORAL | Status: AC
  Administered 2023-02-03: 1 mg via ORAL
  Filled 2023-02-03: qty 1

## 2023-02-03 MED ORDER — FENTANYL CITRATE (PF) 100 MCG/2ML IJ SOLN
INTRAMUSCULAR | Status: AC
  Filled 2023-02-03: qty 2

## 2023-02-03 MED ORDER — CHLORHEXIDINE GLUCONATE 0.12 % MT SOLN
15.0000 mL | Freq: Once | OROMUCOSAL | Status: AC
  Administered 2023-02-03: 15 mL via OROMUCOSAL

## 2023-02-03 MED ORDER — BUPIVACAINE HCL (PF) 0.5 % IJ SOLN
INTRAMUSCULAR | Status: AC
  Filled 2023-02-03: qty 30

## 2023-02-03 MED ORDER — DEXAMETHASONE SODIUM PHOSPHATE 10 MG/ML IJ SOLN
INTRAMUSCULAR | Status: AC
  Filled 2023-02-03: qty 1

## 2023-02-03 MED ORDER — ONDANSETRON HCL 4 MG/2ML IJ SOLN
INTRAMUSCULAR | Status: AC
  Filled 2023-02-03: qty 2

## 2023-02-03 MED ORDER — MIDAZOLAM HCL 5 MG/5ML IJ SOLN
INTRAMUSCULAR | Status: DC | PRN
  Administered 2023-02-03: 2 mg via INTRAVENOUS

## 2023-02-03 MED ORDER — BACITRACIN ZINC 500 UNIT/GM EX OINT
TOPICAL_OINTMENT | CUTANEOUS | Status: AC
  Filled 2023-02-03: qty 28.35

## 2023-02-03 MED ORDER — DEXAMETHASONE SODIUM PHOSPHATE 10 MG/ML IJ SOLN
INTRAMUSCULAR | Status: DC | PRN
  Administered 2023-02-03: 10 mg via INTRAVENOUS

## 2023-02-03 MED ORDER — FENTANYL CITRATE (PF) 100 MCG/2ML IJ SOLN
INTRAMUSCULAR | Status: DC | PRN
  Administered 2023-02-03: 50 ug via INTRAVENOUS

## 2023-02-03 MED ORDER — ONDANSETRON HCL 4 MG/2ML IJ SOLN: 4.0000 mg | Freq: Once | INTRAMUSCULAR | Status: DC | PRN

## 2023-02-03 MED ORDER — FENTANYL CITRATE PF 50 MCG/ML IJ SOSY: 25.0000 ug | PREFILLED_SYRINGE | INTRAMUSCULAR | Status: DC | PRN

## 2023-02-03 MED ORDER — LIDOCAINE 2% (20 MG/ML) 5 ML SYRINGE
INTRAMUSCULAR | Status: DC | PRN
  Administered 2023-02-03: 100 mg via INTRAVENOUS

## 2023-02-03 MED ORDER — ONDANSETRON HCL 4 MG/2ML IJ SOLN
INTRAMUSCULAR | Status: DC | PRN
  Administered 2023-02-03: 4 mg via INTRAVENOUS

## 2023-02-03 MED ORDER — LIDOCAINE HCL (PF) 2 % IJ SOLN
INTRAMUSCULAR | Status: AC
  Filled 2023-02-03: qty 5

## 2023-02-03 MED ORDER — PHENYLEPHRINE 80 MCG/ML (10ML) SYRINGE FOR IV PUSH (FOR BLOOD PRESSURE SUPPORT)
PREFILLED_SYRINGE | INTRAVENOUS | Status: AC
  Filled 2023-02-03: qty 10

## 2023-02-03 MED ORDER — EPHEDRINE SULFATE-NACL 50-0.9 MG/10ML-% IV SOSY
PREFILLED_SYRINGE | INTRAVENOUS | Status: DC | PRN
  Administered 2023-02-03: 5 mg via INTRAVENOUS
  Administered 2023-02-03: 10 mg via INTRAVENOUS

## 2023-02-03 MED ORDER — PROPOFOL 10 MG/ML IV BOLUS
INTRAVENOUS | Status: DC | PRN
  Administered 2023-02-03: 150 mg via INTRAVENOUS

## 2023-02-03 MED ORDER — GADOBUTROL 1 MMOL/ML IV SOLN
7.5000 mL | Freq: Once | INTRAVENOUS | Status: AC | PRN
  Administered 2023-02-03: 7.5 mL via INTRAVENOUS

## 2023-02-03 MED ORDER — ROCURONIUM BROMIDE 10 MG/ML (PF) SYRINGE
PREFILLED_SYRINGE | INTRAVENOUS | Status: AC
  Filled 2023-02-03: qty 10

## 2023-02-03 MED ORDER — MIDAZOLAM HCL 2 MG/2ML IJ SOLN
INTRAMUSCULAR | Status: AC
  Filled 2023-02-03: qty 2

## 2023-02-03 MED ORDER — DEXMEDETOMIDINE HCL IN NACL 80 MCG/20ML IV SOLN
INTRAVENOUS | Status: DC | PRN
  Administered 2023-02-03: 8 ug via BUCCAL

## 2023-02-03 MED ORDER — BUPIVACAINE HCL (PF) 0.5 % IJ SOLN
INTRAMUSCULAR | Status: DC | PRN
  Administered 2023-02-03: 5 mL

## 2023-02-03 MED ORDER — PROPOFOL 10 MG/ML IV BOLUS
INTRAVENOUS | Status: AC
  Filled 2023-02-03: qty 20

## 2023-02-03 MED ORDER — LIDOCAINE HCL (PF) 1 % IJ SOLN
INTRAMUSCULAR | Status: AC
  Filled 2023-02-03: qty 30

## 2023-02-03 SURGICAL SUPPLY — 34 items
BAG COUNTER SPONGE SURGICOUNT (BAG) IMPLANT
BAG SPEC THK2 15X12 ZIP CLS (MISCELLANEOUS) ×1
BAG SPNG CNTER NS LX DISP (BAG)
BAG ZIPLOCK 12X15 (MISCELLANEOUS) ×1 IMPLANT
BLADE SURG SZ10 CARB STEEL (BLADE) ×2 IMPLANT
BNDG COHESIVE 1X5 TAN STRL LF (GAUZE/BANDAGES/DRESSINGS) IMPLANT
BNDG ELASTIC 4X5.8 VLCR STR LF (GAUZE/BANDAGES/DRESSINGS) IMPLANT
COVER SURGICAL LIGHT HANDLE (MISCELLANEOUS) ×1 IMPLANT
CUFF TOURN SGL QUICK 18X4 (TOURNIQUET CUFF) ×1 IMPLANT
DRAIN PENROSE 0.5X18 (DRAIN) IMPLANT
DRAPE SURG 17X11 SM STRL (DRAPES) ×1 IMPLANT
DRSG EMULSION OIL 3X3 NADH (GAUZE/BANDAGES/DRESSINGS) IMPLANT
ELECT REM PT RETURN 15FT ADLT (MISCELLANEOUS) ×1 IMPLANT
GAUZE SPONGE 4X4 12PLY STRL (GAUZE/BANDAGES/DRESSINGS) ×2 IMPLANT
GLOVE BIO SURGEON STRL SZ7.5 (GLOVE) ×2 IMPLANT
GLOVE BIO SURGEON STRL SZ8 (GLOVE) ×1 IMPLANT
GLOVE BIOGEL PI IND STRL 7.5 (GLOVE) ×1 IMPLANT
KIT BASIN OR (CUSTOM PROCEDURE TRAY) ×1 IMPLANT
KIT TURNOVER KIT A (KITS) IMPLANT
MANIFOLD NEPTUNE II (INSTRUMENTS) ×1 IMPLANT
NS IRRIG 1000ML POUR BTL (IV SOLUTION) ×1 IMPLANT
PACK ORTHO EXTREMITY (CUSTOM PROCEDURE TRAY) ×1 IMPLANT
PAD CAST 4YDX4 CTTN HI CHSV (CAST SUPPLIES) ×1 IMPLANT
PADDING CAST COTTON 4X4 STRL (CAST SUPPLIES) ×1
PENCIL SMOKE EVACUATOR (MISCELLANEOUS) IMPLANT
PROTECTOR NERVE ULNAR (MISCELLANEOUS) ×1 IMPLANT
SOL PREP POV-IOD 4OZ 10% (MISCELLANEOUS) ×1 IMPLANT
SOL SCRUB PVP POV-IOD 4OZ 7.5% (MISCELLANEOUS) ×1
SOLUTION SCRB POV-IOD 4OZ 7.5% (MISCELLANEOUS) ×1 IMPLANT
SUT CHROMIC 4 0 SH 27 (SUTURE) IMPLANT
SUT PROLENE 3 0 PS 2 (SUTURE) ×1 IMPLANT
SWAB CULTURE ESWAB REG 1ML (MISCELLANEOUS) IMPLANT
TOWEL OR 17X26 10 PK STRL BLUE (TOWEL DISPOSABLE) ×1 IMPLANT
WATER STERILE IRR 1000ML POUR (IV SOLUTION) ×1 IMPLANT

## 2023-02-03 NOTE — Progress Notes (Signed)
MRI left ring finger shows no osteomyelitis. Will preserve bone and perform nail removal, debridement and all indicated procedures for source control infection. Right hand no signs of infection- ROM, pain, swelling continues to improve.  Plan for surgery Left ring finger nail plate removal, debridement all indicated procedures. The risks, benefits and alternatives of surgery discussed and patient would like to proceed.  Izell Kenton, MD Orthopaedic Hand Surgeon

## 2023-02-03 NOTE — Progress Notes (Signed)
PT Cancellation Note  Patient Details Name: Nathaniel Warren MRN: [id number] DOB: 05-26-1966   Cancelled Treatment:    Reason Eval/Treat Not Completed: Patient at procedure or test/unavailable    Doreatha Massed, PT Acute Rehabilitation  Office: [phone]

## 2023-02-03 NOTE — TOC Progression Note (Signed)
Transition of Care [facility]) - Progression Note    Patient Details  Name: Nathaniel Warren MRN: [id number] Date of Birth: March 07, 1966  [facility] CM/SW Plato, LCSW Phone Number: 02/03/2023, 2:21 PM  Clinical Narrative:    Per chart review, pt not medically stable. TOC to follow.      Expected Discharge Plan: Psychiatric Hospital Barriers to Discharge: Continued Medical Work up  Expected Discharge Plan and Services In-house Referral: Clinical Social Work Discharge Planning Services: NA Post Acute Care Choice: NA Living arrangements for the past 2 months: Single Family Home                 DME Arranged: N/A DME Agency: NA                   Social Determinants of Health (SDOH) Interventions [place]: Food Insecurity Present (01/23/2023)  Housing: Medium Risk (01/23/2023)  Transportation Needs: Unmet Transportation Needs (01/23/2023)  Utilities: Not At Risk (01/23/2023)  Alcohol Screen: Medium Risk (10/27/2017)  Depression (PHQ2-9): High Risk (12/18/2022)  Financial Resource Strain: High Risk (07/15/2022)  Physical Activity: Inactive (07/15/2022)  Social Connections: Socially Isolated (07/15/2022)  Stress: Stress Concern Present (07/15/2022)  Tobacco Use: High Risk (01/23/2023)    Readmission Risk Interventions     No data to display

## 2023-02-03 NOTE — Progress Notes (Signed)
I was reconsulted to evaluate bilateral hands.  His right hand is dramatically improved with the swelling over the dorsal aspect much more supple he has a resolving hematoma in this area no signs of superinfection no erythema or drainage the bulla has ruptured however it is well-healed over his finger range of motion is improved.  He does have stiffness as expected however he is improving.  Sensations intact to light touch with brisk ca pillary fill tips of the digits.  I discussed with him his left hand ring finger and this was not previously brought up to [place] however he said around Christmas time he drove a drill bit beneath his nail down the nailbed retrograde and has been festering since then.  It is significantly deformed with gross debris and some mild erythema.  There is no purulent drainage however.  There is no tracking up proximally along the flexor shea th nor dorsally.  Slightly altered sensation in this area overall is warm and well-perfused.  He can flex into a composite fist and full extension.  Recommend n.p.o. after breakfast okay to have his breakfast tray.  Please obtain MRI of the left ring finger to assess for osteomyelitis of the distal phalanx.  Patient will need debridement of this left ring finger possible amputation depending on results of the MRI for s ource control as the patient now has positive blood cultures and leukocytosis.  I do not think the source is his right hand as this is much improved and we have now uncovered the source to his left ring finger

## 2023-02-03 NOTE — Progress Notes (Signed)
Pharmacy Antibiotic Note  Nathaniel Warren is a 57 y.o. male with hx EtOH and liver cirrhosis who presented to the ED on 01/22/2023 with c/o hand pain/swelling (s/p accidentally hit his right hand on 01/21/23) and groin swelling. Right hand CT on 2/16 "diffuse severe subcutaneous soft tissue swelling/edema/fluid most notably involving the dorsum of the hand."  Pharmacy consulted  on 01/31/23 to dose vancomycin for suspected right hand infection and elevated wbc.  R hand CT 2/24: no abscess 2/26: necrotic tip L 4th finger eval by ortho - no purulence, no tracking> needs MRI, I&D & possible amputation 1/4 bottles BCx strep species -possible contaminant, covered by ceftriaxone  Today, 02/03/2023: - Tmax 99.3 - WBC 12.1  - scr 0.89 (crcl~95) MRI L fingers ordered OR scheduled for 1600: I&D L ring finger, possible amputation  Plan: Continue vancomycin 1000 mg q12h for est AUC 487 Continue ceftriaxone 2 gm q24h Rec to stop vancomycin 24 hrs s/p OR   ___________________________________________________  Height: '5\' 9"'$  (175.3 cm) Weight: 77.7 kg (171 lb 4.8 oz) IBW/kg (Calculated) : 70.7  Temp (24hrs), Avg:98.9 F (37.2 C), Min:98.5 F (36.9 C), Max:99.3 F (37.4 C)  Recent Labs  Lab 01/28/23 0352 01/29/23 0404 01/31/23 0438 02/01/23 0414 02/02/23 0518 02/03/23 0422  WBC 10.5 11.5* 16.2* 13.5* 12.5* 12.1*  CREATININE 1.08 1.08 1.02  --  0.89  --      Estimated Creatinine Clearance: 94.9 mL/min (by C-G formula based on SCr of 0.89 mg/dL).    No Known Allergies  Antimicrobials this admission:  2/24 CTX>> 2/24 vancomycin>> Dose adjustments this admission:  2/26 CTX 1 gm > 2 gm Microbiology results:  2/24 BCxs: 1/4 bottles strep species   Thank you for allowing pharmacy to be a part of this patient's care.  [name], Pharm.D Use secure chat for questions 02/03/2023 8:32 AM

## 2023-02-03 NOTE — Plan of Care (Signed)
  Problem: Health Behavior/Discharge Planning: Goal: Ability to manage health-related needs will improve Outcome: Progressing   Problem: Clinical Measurements: Goal: Ability to maintain clinical measurements within normal limits will improve Outcome: Progressing   

## 2023-02-03 NOTE — Op Note (Signed)
OPERATIVE NOTE  DATE OF PROCEDURE: 01/22/2023 - 02/03/2023  SURGEONS:  Primary: Orene Desanctis, MD  PREOPERATIVE DIAGNOSIS: INFECTION LEFT RING FINGER  POSTOPERATIVE DIAGNOSIS: left ring finger infection and dorsal hand infection  NAME OF PROCEDURE:   Left ring finger I&D, nail plate removal, nailbed repair, soft tissue rearrangement <10sqcm for coverage of bone Left dorsal hand abscess I&D  ANESTHESIA: General + digital block  SKIN PREPARATION: Hibiclens  ESTIMATED BLOOD LOSS: Minimal  IMPLANTS: none  INDICATIONS:  Nathaniel Warren is a 57 y.o. male who has the above preoperative diagnosis. The patient has decided to proceed with surgical intervention.  Risks, benefits and alternatives of operative management were discussed including, but not limited to, risks of anesthesia complications, infection, pain, persistent symptoms, stiffness, need for future surgery.  The patient understands, agrees and elects to proceed with surgery.    DESCRIPTION OF PROCEDURE: The patient was met in the pre-operative area and their identity was verified.  The operative location and laterality was also verified and marked.  The patient was brought to the OR and was placed supine on the table.  After repeat patient identification with the operative team anesthesia was provided and the patient was prepped and draped in the usual sterile fashion.  A final timeout was performed verifying the correction patient, procedure, location and laterality.  The left upper extremity was elevated and tourniquet placed at the base of the left ring finger.  Irrigation debridement of the left ring fingertip was performed.  There was significant amount of gross debris and ulceration as well as infection.  The nail plate was removed and there was some erosions around the nailbed however no involvement of the underlying bone.  Sharp debridement was performed.  This was an excisional debridement with 15 blade scalpel of skin and subcutaneous  tissue.  All of the gross debris was removed.  Deep cultures were obtained.  After gross debridement there is no further devitalized tissue this was thoroughly irrigated with normal saline.  At this time a soft tissue rearrangement procedure was performed to mobilize some of the volar skin flaps in order to cover and recreate the paronychia fold.  This was less than 10 cm and had no tension on the repair and adequate coverage of underlying bone.  The nailbed was then repaired at the junction between the sterile and germinal matrix along the radial border.  At this time inspection and palpation of the left hand was performed and there was some red streaking over the dorsal aspect of the ring finger tracking to the dorsal hand and while the patient was under adequate anesthesia I was able to identify a small pocket of fluid which was palpable with some erythema and induration and potentially consistent with infection.  Using 18-gauge needle to aspirate this and gross purulence was identified.  I made a incision over the dorsal aspect of the hand approximately 2 cm in length and this was evacuated.  This was thoroughly irrigated there is no further infection identified within this area all fingers were inspected the dorsal hand and wrist was inspected with no further fluctuance or areas of erythema.  This was left open there was no exposed bone or tendon beneath the area that was incised.  The finger tourniquet was deflated the finger was pink and warm and well-perfused.  All dressings were applied.  All counts were correct x 2.  The patient tolerated the procedure well and was awoken from anesthesia and brought to PACU for recovery in stable  condition.  Postoperative plan the bandage can remain in place until postoperative day 2 at which time can be changed by the patient or nursing staff to just a soft bandage around the finger and dorsal hand.  Continue IV antibiotics and trend his inflammatory markers.  Continue  with finger range of motion to bilateral hands and elevation is much as possible.  Appreciate OT to be involved with the left hand now as well.  Patient will follow-up with me in 1 to 2 weeks in the office for a wound check.  Matt Holmes, MD

## 2023-02-03 NOTE — Anesthesia Preprocedure Evaluation (Addendum)
Anesthesia Evaluation  Patient identified by MRN, date of birth, ID band Patient awake    Reviewed: Allergy & Precautions, H&P , NPO status , Patient's Chart, lab work & pertinent test results  Airway Mallampati: II  TM Distance: >3 FB Neck ROM: Full    Dental  (+) Poor Dentition, Chipped, Dental Advisory Given   Pulmonary Current Smoker and Patient abstained from smoking.   Pulmonary exam normal breath sounds clear to auscultation       Cardiovascular negative cardio ROS Normal cardiovascular exam Rhythm:Regular Rate:Normal     Neuro/Psych CVA  negative psych ROS   GI/Hepatic negative GI ROS,,,(+) Cirrhosis   ascites  substance abuse  End stage liver dz Patient coagulopathic due to liver failure   Endo/Other  negative endocrine ROS    Renal/GU negative Renal ROS  negative genitourinary   Musculoskeletal negative musculoskeletal ROS (+)    Abdominal   Peds negative pediatric ROS (+)  Hematology  (+) Blood dyscrasia, anemia thrombocytopenia   Anesthesia Other Findings   Reproductive/Obstetrics negative OB ROS                             Anesthesia Physical Anesthesia Plan  ASA: 4  Anesthesia Plan: General   Post-op Pain Management: Minimal or no pain anticipated   Induction: Intravenous and Rapid sequence  PONV Risk Score and Plan: 1 and Ondansetron  Airway Management Planned: Oral ETT  Additional Equipment:   Intra-op Plan:   Post-operative Plan: Extubation in OR  Informed Consent: I have reviewed the patients History and Physical, chart, labs and discussed the procedure including the risks, benefits and alternatives for the proposed anesthesia with the patient or authorized representative who has indicated his/her understanding and acceptance.     Dental advisory given  Plan Discussed with: CRNA and Surgeon  Anesthesia Plan Comments:        Anesthesia Quick  Evaluation

## 2023-02-03 NOTE — Consult Note (Signed)
Brief Psychiatry Consult Note  Went to see pt. Off floor for surgery d/t newly discovered infection on L hand, will see tomorrow  - need to call collateral - see if still meets inpt criteria, generally improving but unable to reliably safety plan  Joycelyn Schmid A Rosario Duey

## 2023-02-03 NOTE — Anesthesia Procedure Notes (Signed)
Procedure Name: Intubation Date/Time: 02/03/2023 5:49 PM  Performed by: Gerald Leitz, CRNAPre-anesthesia Checklist: Patient identified, Patient being monitored, Timeout performed, Emergency Drugs available and Suction available Patient Re-evaluated:Patient Re-evaluated prior to induction Oxygen Delivery Method: Circle system utilized Preoxygenation: Pre-oxygenation with 100% oxygen Induction Type: IV induction and Rapid sequence Ventilation: Mask ventilation without difficulty Laryngoscope Size: Mac and 3 Grade View: Grade I Tube type: Oral Tube size: 7.5 mm Number of attempts: 1 Airway Equipment and Method: Stylet Placement Confirmation: ETT inserted through vocal cords under direct vision, positive ETCO2 and breath sounds checked- equal and bilateral Secured at: 23 cm Tube secured with: Tape Dental Injury: Teeth and Oropharynx as per pre-operative assessment

## 2023-02-03 NOTE — Progress Notes (Signed)
PROGRESS NOTE  Nathaniel Warren  [id number] DOB: 1966/10/27 DOA: 01/22/2023 PCP: [name], NP   Brief Narrative: Patient is a 57 year old male with history of alcohol abuse/cirrhosis of liver, depression, chronic right ankle pain, suicidal attempts presented to the emergency department with complaint of left groin swelling, right arm swelling.  Found to have hematoma of the left groin as well as swelling of the right arm consistent with hematoma.  CT hand showed diffuse swelling without any abscess.  Due to suicidal ideation, psychiatry was consulted.  Hand surgery also consulted.  Swelling  improved.  Hematoma remains stable.  Given few days of vitamin K given this hospitalization for elevated INR.  PT/OT recommending acute inpatient rehab/SNF but looks difficult due to his psychiatric history.  Psychiatry recommending inpatient psychiatric admission.  TOC following. Hospital course remarkable for persistent pain and swelling of the right hand,finding of necrotic left 4th finger tip.  Hand surgery following,plan for debridement of the left fourth fingertip  Assessment & Plan:  Principal Problem:   Spontaneous hematoma of lower leg Active Problems:   Alcoholic cirrhosis of liver with ascites (HCC)   MDD (major depressive disorder), recurrent severe, without psychosis ([place])   Hyponatremia   Tobacco abuse   Suicidal ideations  Right arm/left groin swelling consistent with hematoma/necrotic left 4th finger tip: Hand surgery was consulted and was following.  Improvement in the range of motion.  No indication for surgery for hematoma and expected slow reabsorption as per hand surgery.  Given few doses of vitamin K, FFP 2 units here. Pain is better.  He is able to move his fingers.  Continue elevation, ice application, pain medication. His bulla has ruptured.  He developed new leukocytosis now improving.  No fever.  Sent blood cultures, started on broad spectrum antibiotics: one of the sets  showed  Streptococcus species mild, most likely contamination.  Follow-up CT scan of the right hand done on 2/25 without evidence of abscess or collection. He was also found to have worsening of the ulcer on the left fourth fingertip, now appears necrotic, foul-smelling.  MRI of the finger does not show any abscess or osteomyelitis.  Dr. Greta Doom planning for debridement today.  Continue current antibiotics  Chronic pain syndrome: Has history of L5-S1 nerve compression with sciatica, chronic knee pain with foot drop.  Continue pain management, supportive care.  PT/OT recommending AIR on discharge but looks difficult because of his psychiatric history.  He may need to increase his mobility while inpatient before being discharged to inpatient psychiatry Continue PT/OT therapy while inpatient  Spermatocele: Recommend ultrasound in 3 months  History of alcohol abuse/liver cirrhosis: Quit in 2023.  Elevated INR.  Given few days of vitamin K.  Has mild elevated liver enzymes along with bilirubin. Recommend to follow-up with GI/hepatology as an outpatient.Follows with [facility].  Has jaundice. On spironolactone Lasix at home, currently on hold due to soft blood pressure.  Appears euvolemic  Macrocytic anemia/thrombocytopenia: This is secondary to chronic alcohol abuse, cirrhosis. continue thiamine /folic acid.  Hemoglobin stable.  Normal folate, vitamin B12 high  Nausea, vomiting: Had nausea with vomiting on 2/21.  Resolved after having a big bowel movement  Suicidal attempt/PTSD: Psychiatry following.  Current IVC.  Plan is to discharge to inpatient psychiatry when possible.          DVT prophylaxis:SCDs Start: 01/23/23 0025     Code Status: Full Code  Family Communication: None at the bedside  Patient status:Inpatient  Patient is from :  Home  Anticipated discharge to: Inpatient psychiatry  Estimated DC date: Will wait for further improvement in the edema and pain of the right hand before  discharge,  hand surgery evaluating necrotic tip of left 4th finger today at OR   Consultants: Hand surgery, psychiatry  Procedures: Bedside I and D  Antimicrobials:  Anti-infectives (From admission, onward)    Start     Dose/Rate Route Frequency Ordered Stop   02/02/23 0900  cefTRIAXone (ROCEPHIN) 2 g in sodium chloride 0.9 % 100 mL IVPB        2 g 200 mL/hr over 30 Minutes Intravenous Every 24 hours 02/01/23 1044     02/01/23 1200  cefTRIAXone (ROCEPHIN) 1 g in sodium chloride 0.9 % 100 mL IVPB  Status:  Discontinued        1 g 200 mL/hr over 30 Minutes Intravenous  Once 02/01/23 1101 02/01/23 1150   01/31/23 2200  vancomycin (VANCOCIN) IVPB 1000 mg/200 mL premix        1,000 mg 200 mL/hr over 60 Minutes Intravenous Every 12 hours 01/31/23 0840     01/31/23 0900  cefTRIAXone (ROCEPHIN) 1 g in sodium chloride 0.9 % 100 mL IVPB  Status:  Discontinued        1 g 200 mL/hr over 30 Minutes Intravenous Every 24 hours 01/31/23 0811 02/01/23 1044   01/31/23 0900  vancomycin (VANCOREADY) IVPB 1500 mg/300 mL        1,500 mg 150 mL/hr over 120 Minutes Intravenous  Once 01/31/23 0840 01/31/23 1336       Subjective: Patient seen and examined at bedside today.  Hemodynamically stable.  Comfortable.  He says the pain and swelling of the right hand is much better.  Hand surgery evaluating the left ring finger today  Objective: Vitals:   02/02/23 1223 02/02/23 1959 02/03/23 0423 02/03/23 1246  BP: 131/64 118/65 129/71 122/60  Pulse: 66 62 73 (!) 58  Resp: '18 18 18 18  '$ Temp: 98.5 F (36.9 C) 98.9 F (37.2 C) 99.3 F (37.4 C) 98.8 F (37.1 C)  TempSrc: Oral Oral Oral Oral  SpO2: 97% 95% 92% 98%  Weight:      Height:        Intake/Output Summary (Last 24 hours) at 02/03/2023 1336 Last data filed at 02/03/2023 0843 Gross per 24 hour  Intake 755.39 ml  Output 625 ml  Net 130.39 ml   Filed Weights   01/23/23 2007  Weight: 77.7 kg    Examination:  General exam: Overall  comfortable, not in distress,icteric HEENT: PERRL Respiratory system:  no wheezes or crackles  Cardiovascular system: S1 & S2 heard, RRR.  Gastrointestinal system: Abdomen is nondistended, soft and nontender. Central nervous system: Alert and oriented Extremities: Ecchymosis/swelling of right hand with ruptured bulla on the dorsum, necrotic tip of left fourth finger Skin: icteric   Data Reviewed: I have personally reviewed following labs and imaging studies  CBC: Recent Labs  Lab 01/28/23 0352 01/29/23 0404 01/31/23 0438 02/01/23 0414 02/02/23 0518 02/03/23 0422  WBC 10.5 11.5* 16.2* 13.5* 12.5* 12.1*  NEUTROABS 5.1  --   --   --   --   --   HGB 9.4* 9.0* 10.3* 9.5* 9.2* 9.1*  HCT 27.5* 25.6* 28.9* 27.5* 26.7* 26.0*  MCV 106.6* 104.1* 102.8* 106.2* 106.4* 104.4*  PLT 79* 80* 97* 98* 93* 95*   Basic Metabolic Panel: Recent Labs  Lab 01/28/23 0352 01/29/23 0404 01/31/23 0438 02/02/23 0518  NA 132* 134* 133* 130*  K 4.1 3.8 4.0 3.8  CL 100 99 99 98  CO2 '26 26 25 24  '$ GLUCOSE 105* 119* 99 105*  BUN '15 19 19 15  '$ CREATININE 1.08 1.08 1.02 0.89  CALCIUM 8.5* 8.7* 8.6* 8.4*     Recent Results (from the past 240 hour(s))  Culture, blood (Routine X 2) w Reflex to ID Panel     Status: None (Preliminary result)   Collection Time: 01/31/23  8:45 AM   Specimen: BLOOD LEFT ARM  Result Value Ref Range Status   Specimen Description   Final    BLOOD LEFT ARM Performed at [facility], [address], [place], [place] [address]    Special Requests   Final    BOTTLES DRAWN AEROBIC AND ANAEROBIC Blood Culture adequate volume Performed at [facility] [address], [place], [place] [address]    Culture   Final    NO GROWTH 3 DAYS Performed at [facility], [place] [address] [place], [place] [address]    Report Status PENDING  Incomplete  Culture, blood (Routine X 2) w Reflex to ID Panel     Status: None (Preliminary result)   Collection Time:  01/31/23  8:46 AM   Specimen: BLOOD RIGHT HAND  Result Value Ref Range Status   Specimen Description   Final    BLOOD RIGHT HAND Performed at [facility], [place] [address] [place], [place] [address]    Special Requests   Final    BOTTLES DRAWN AEROBIC AND ANAEROBIC Blood Culture adequate volume Performed at [facility] [address], [place], [place] [address]    Culture  Setup Time   Final    GRAM POSITIVE COCCI IN CHAINS ANAEROBIC BOTTLE ONLY Organism ID to follow CRITICAL RESULT CALLED TO, READ BACK BY AND VERIFIED WITH:  C/ PHARMD J. LEGGE 02/02/23 1256 A. LAFRANCE Performed at [facility], [place] [address], [place], [place] [address]    Culture GRAM POSITIVE COCCI  Final   Report Status PENDING  Incomplete  Blood Culture ID Panel (Reflexed)     Status: Abnormal   Collection Time: 01/31/23  8:46 AM  Result Value Ref Range Status   Enterococcus faecalis NOT DETECTED NOT DETECTED Final   Enterococcus Faecium NOT DETECTED NOT DETECTED Final   Listeria monocytogenes NOT DETECTED NOT DETECTED Final   Staphylococcus species NOT DETECTED NOT DETECTED Final   Staphylococcus aureus (BCID) NOT DETECTED NOT DETECTED Final   Staphylococcus epidermidis NOT DETECTED NOT DETECTED Final   Staphylococcus lugdunensis NOT DETECTED NOT DETECTED Final   Streptococcus species DETECTED (A) NOT DETECTED Final    Comment: Not Enterococcus species, Streptococcus agalactiae, Streptococcus pyogenes, or Streptococcus pneumoniae. CRITICAL RESULT CALLED TO, READ BACK BY AND VERIFIED WITH:  C/ PHARMD J. LEGGE 02/02/23 1256 A. LAFRANCE    Streptococcus agalactiae NOT DETECTED NOT DETECTED Final   Streptococcus pneumoniae NOT DETECTED NOT DETECTED Final   Streptococcus pyogenes NOT DETECTED NOT DETECTED Final   A.calcoaceticus-baumannii NOT DETECTED NOT DETECTED Final   Bacteroides fragilis NOT DETECTED NOT DETECTED Final   Enterobacterales NOT DETECTED NOT DETECTED Final    Enterobacter cloacae complex NOT DETECTED NOT DETECTED Final   Escherichia coli NOT DETECTED NOT DETECTED Final   Klebsiella aerogenes NOT DETECTED NOT DETECTED Final   Klebsiella oxytoca NOT DETECTED NOT DETECTED Final   Klebsiella pneumoniae NOT DETECTED NOT DETECTED Final   Proteus species NOT DETECTED NOT DETECTED Final  Salmonella species NOT DETECTED NOT DETECTED Final   Serratia marcescens NOT DETECTED NOT DETECTED Final   Haemophilus influenzae NOT DETECTED NOT DETECTED Final   Neisseria meningitidis NOT DETECTED NOT DETECTED Final   Pseudomonas aeruginosa NOT DETECTED NOT DETECTED Final   Stenotrophomonas maltophilia NOT DETECTED NOT DETECTED Final   Candida albicans NOT DETECTED NOT DETECTED Final   Candida auris NOT DETECTED NOT DETECTED Final   Candida glabrata NOT DETECTED NOT DETECTED Final   Candida krusei NOT DETECTED NOT DETECTED Final   Candida parapsilosis NOT DETECTED NOT DETECTED Final   Candida tropicalis NOT DETECTED NOT DETECTED Final   Cryptococcus neoformans/gattii NOT DETECTED NOT DETECTED Final    Comment: Performed at [facility], [place] [address], [place], [place] [address]     Radiology Studies: MR FINGERS LEFT W WO CONTRAST  Result Date: 02/03/2023 CLINICAL DATA:  Distal left ring finger infection after drill bit injury around Christmas. EXAM: MRI OF THE LEFT FINGERS WITHOUT AND WITH CONTRAST TECHNIQUE: Multiplanar, multisequence MR imaging of the left hand was performed before and after the administration of intravenous contrast. CONTRAST:  7.57m GADAVIST GADOBUTROL 1 MMOL/ML IV SOLN COMPARISON:  Left ring finger x-rays from yesterday. FINDINGS: Bones/Joint/Cartilage No marrow signal abnormality. No fracture or dislocation. Joint spaces are preserved. No joint effusion. Ligaments Collateral ligaments are intact. Muscles and Tendons Flexor and extensor tendons are intact. No tenosynovitis. Soft tissue Dorsal distal ring finger soft tissue ulceration  around the nail bed. No fluid collection. No soft tissue mass. IMPRESSION: 1. Dorsal distal ring finger soft tissue ulceration around the nail bed. No evidence of osteomyelitis or abscess. Electronically Signed   By: WTitus DubinM.D.   On: 02/03/2023 11:40   DG Finger Ring Left  Result Date: 02/02/2023 CLINICAL DATA:  Wound check.  Abscess. EXAM: LEFT RING FINGER 2+V COMPARISON:  Left hand radiographs 09/22/2022 FINDINGS: There is overlying bandage material that limits evaluation of fine bony detail. There is again irregularity of the distal predominantly lateral/radial soft tissues of the fourth finger indicating a chronic wound. There is moderate soft tissue swelling. No definite cortical erosion is seen. 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 No definite cortical erosion to suggest osteomyelitis. Electronically Signed   By: RYvonne KendallM.D.   On: 02/02/2023 11:53   CT HAND RIGHT W CONTRAST  Result Date: 02/01/2023 CLINICAL DATA:  Right hand soft tissue infection, right hand pain, swelling EXAM: CT OF THE UPPER RIGHT EXTREMITY WITH CONTRAST TECHNIQUE: Multidetector CT imaging of the upper right extremity was performed according to the standard protocol following intravenous contrast administration. RADIATION DOSE REDUCTION: This exam was performed according to the departmental dose-optimization program which includes automated exposure control, adjustment of the mA and/or kV according to patient size and/or use of iterative reconstruction technique. CONTRAST:  749mOMNIPAQUE IOHEXOL 300 MG/ML  SOLN COMPARISON:  01/23/2023 FINDINGS: Bones/Joint/Cartilage Remote healed left fifth metacarpal fracture again noted. No acute fracture or dislocation identified. No lytic or blastic bone lesion. Joint spaces are preserved. Ligaments Suboptimally assessed by CT. Muscles and Tendons Unremarkable Soft tissues There is marked dorsal soft tissue  swelling again noted. And increasingly organized hyperdense fluid collection is seen within the dorsal soft tissues superficial to the extensor tendons measuring roughly 2.5 x 5.0 x 7.5 cm in greatest dimension on axial image # 75/8 and sagittal image # 45/10 in keeping with a probable evolving subcutaneous hematoma. No subcutaneous gas identified. No  retained radiopaque foreign body. IMPRESSION: 1. Marked dorsal soft tissue swelling with increasingly organized hyperdense fluid collection within the dorsal soft tissues superficial to the extensor tendons in keeping with a probable evolving subcutaneous hematoma. Note that super infection is not excluded on this examination alone. No subcutaneous gas identified. No retained radiopaque foreign body. 2. No acute fracture or dislocation. 3. Remote healed left fifth metacarpal fracture. Electronically Signed   By: Fidela Salisbury M.D.   On: 02/01/2023 23:13    Scheduled Meds:  folic acid  1 mg Oral Daily   PARoxetine  10 mg Oral Daily   polyethylene glycol  17 g Oral Daily   QUEtiapine  25 mg Oral QHS   sodium chloride flush  3 mL Intravenous Q12H   thiamine  100 mg Oral Daily   Continuous Infusions:  sodium chloride     cefTRIAXone (ROCEPHIN)  IV 2 g (02/03/23 0842)   vancomycin 1,000 mg (02/03/23 1017)     LOS: 11 days   Shelly Coss, MD [facility] P2/27/2024, 1:36 PM

## 2023-02-03 NOTE — Transfer of Care (Signed)
Immediate Anesthesia Transfer of Care Note  Patient: Nathaniel Warren  Procedure(s) Performed: IRRIGATION AND DEBRIDEMENT WOUND LEFT RING FINGER POSSIBLE AMPUTATION (Left)  Patient Location: PACU  Anesthesia Type:General  Level of Consciousness: sedated  Airway & Oxygen Therapy: Patient Spontanous Breathing and Patient connected to face mask oxygen  Post-op Assessment: Report given to RN and Post -op Vital signs reviewed and stable  Post vital signs: Reviewed and stable  Last Vitals:  Vitals Value Taken Time  BP    Temp    Pulse 60 02/03/23 1852  Resp 14 02/03/23 1852  SpO2 100 % 02/03/23 1852  Vitals shown include unvalidated device data.  Last Pain:  Vitals:   02/03/23 1617  TempSrc: Oral  PainSc: 9       Patients Stated Pain Goal: 4 ([id number])  Complications: No notable events documented.

## 2023-02-04 ENCOUNTER — Encounter (HOSPITAL_COMMUNITY): Payer: Self-pay | Admitting: Orthopedic Surgery

## 2023-02-04 ENCOUNTER — Other Ambulatory Visit: Payer: Self-pay

## 2023-02-04 DIAGNOSIS — F332 Major depressive disorder, recurrent severe without psychotic features: Secondary | ICD-10-CM

## 2023-02-04 LAB — CBC
HCT: 28.6 % — ABNORMAL LOW (ref 39.0–52.0)
Hemoglobin: 10 g/dL — ABNORMAL LOW (ref 13.0–17.0)
MCH: 36.8 pg — ABNORMAL HIGH (ref 26.0–34.0)
MCHC: 35 g/dL (ref 30.0–36.0)
MCV: 105.1 fL — ABNORMAL HIGH (ref 80.0–100.0)
Platelets: 107 10*3/uL — ABNORMAL LOW (ref 150–400)
RBC: 2.72 MIL/uL — ABNORMAL LOW (ref 4.22–5.81)
RDW: 18.2 % — ABNORMAL HIGH (ref 11.5–15.5)
WBC: 6.6 10*3/uL (ref 4.0–10.5)
nRBC: 0 % (ref 0.0–0.2)

## 2023-02-04 LAB — COMPREHENSIVE METABOLIC PANEL
ALT: 22 U/L (ref 0–44)
AST: 59 U/L — ABNORMAL HIGH (ref 15–41)
Albumin: 2.1 g/dL — ABNORMAL LOW (ref 3.5–5.0)
Alkaline Phosphatase: 141 U/L — ABNORMAL HIGH (ref 38–126)
Anion gap: 9 (ref 5–15)
BUN: 15 mg/dL (ref 6–20)
CO2: 23 mmol/L (ref 22–32)
Calcium: 8.6 mg/dL — ABNORMAL LOW (ref 8.9–10.3)
Chloride: 100 mmol/L (ref 98–111)
Creatinine, Ser: 0.89 mg/dL (ref 0.61–1.24)
GFR, Estimated: >60 mL/min (ref >60)
Glucose, Bld: 155 mg/dL — ABNORMAL HIGH (ref 70–99)
Potassium: 4.2 mmol/L (ref 3.5–5.1)
Sodium: 132 mmol/L — ABNORMAL LOW (ref 135–145)
Total Bilirubin: 6.8 mg/dL — ABNORMAL HIGH (ref 0.3–1.2)
Total Protein: 6.3 g/dL — ABNORMAL LOW (ref 6.5–8.1)

## 2023-02-04 NOTE — TOC Progression Note (Addendum)
Transition of Care [facility]) - Progression Note    Patient Details  Name: LEVELLE BALSAMO MRN: [id number] Date of Birth: 02-08-1966  Transition of Care [facility]) CM/SW [place], RN Phone Number: 02/04/2023, 5:25 PM  Clinical Narrative:   This RNCM notified to assist with rescinding IVC, per chart review IVC was initiated on 01/23/23 for 7 days (per section # page 4). Per secure chat Dr Lovette Cliche and Dr. Bonner Puna request to rescind IVC. This RNCM faxed notice of commitment change, received successful tx notice at 17:54.  No additional TOC needs for this RNCM.    Expected Discharge Plan: Psychiatric Hospital Barriers to Discharge: Continued Medical Work up  Expected Discharge Plan and Services In-house Referral: Clinical Social Work Discharge Planning Services: NA Post Acute Care Choice: NA Living arrangements for the past 2 months: Single Family Home                 DME Arranged: N/A DME Agency: NA                   Social Determinants of Health (SDOH) Interventions [place]: Food Insecurity Present (01/23/2023)  Housing: Medium Risk (01/23/2023)  Transportation Needs: Unmet Transportation Needs (01/23/2023)  Utilities: Not At Risk (01/23/2023)  Alcohol Screen: Medium Risk (10/27/2017)  Depression (PHQ2-9): High Risk (12/18/2022)  Financial Resource Strain: High Risk (07/15/2022)  Physical Activity: Inactive (07/15/2022)  Social Connections: Socially Isolated (07/15/2022)  Stress: Stress Concern Present (07/15/2022)  Tobacco Use: High Risk (02/04/2023)    Readmission Risk Interventions     No data to display

## 2023-02-04 NOTE — Progress Notes (Signed)
Occupational Therapy Treatment Patient Details Name: Nathaniel Warren MRN: [id number] DOB: November 25, 1966 Today's Date: 02/04/2023   History of present illness 57 yo male with onset of grion and RT dorsal hand bruising and edema with reported light trauma during a fall.  Has h/o R foot drop without AFO. + for suicidal ideations.   PMHx:  etoh abuse, depression with h/o 4 suicide attempts, stroke, seizures, liver failure, R ankle fracture with ORIF, atherosclerosis, cirrhosis, diverticulosis, osteopenia   OT comments  Patient has improved edema in right hand and improved ROM in fingers. Now s/p I & D of left ring finger with big bulky dressing and hand mildly edematous. Patient reports he is able to feed himself still and assist with ADLs. Less pain today as well. Patient provided with PROM and manual therapy to improve ROM and edema and also provided with squeeze ball. Cont POC.    Recommendations for follow up therapy are one component of a multi-disciplinary discharge planning process, led by the attending physician.  Recommendations may be updated based on patient status, additional functional criteria and insurance authorization.    Follow Up Recommendations  Skilled nursing-short term rehab (<3 hours/day)     Assistance Recommended at Discharge Frequent or constant Supervision/Assistance  Patient can return home with the following  A little help with bathing/dressing/bathroom;A little help with walking and/or transfers;Assistance with cooking/housework;Assist for transportation;Direct supervision/assist for medications management   Equipment Recommendations  None recommended by OT    Recommendations for Other Services      Precautions / Restrictions Precautions Precautions: Fall Precaution Comments: Suicide precautions, 1:1 sitter Restrictions Weight Bearing Restrictions: No Other Position/Activity Restrictions: Limited RT hand WB due to pain       Mobility Bed Mobility                     Transfers                         Balance                                           ADL either performed or assessed with clinical judgement   ADL                                              Extremity/Trunk Assessment Upper Extremity Assessment Upper Extremity Assessment: LUE deficits/detail RUE Deficits / Details: decreased edema in righth and, dressing removed, healing wound on dorsum of hand, able to passively Range MCPs to almost 90 degrees, approx 3/4 closed fist RUE Coordination: decreased fine motor LUE Deficits / Details: s/p I & D to ring finger with bulky dressing, other fingers and dorsum moderately edematous LUE Coordination: decreased fine motor   Lower Extremity Assessment Lower Extremity Assessment: Defer to PT evaluation        Vision Patient Visual Report: No change from baseline     Perception     Praxis      Cognition Arousal/Alertness: Awake/alert Behavior During Therapy: WFL for tasks assessed/performed Overall Cognitive Status: Within Functional Limits for tasks assessed  Exercises Other Exercises Other Exercises: Manual techniques to each finger to reduce edema and then passive ROM to digits per patient. Other Exercises: Left hand finger flexion except 4th digit Other Exercises: Provided with squeeze ball    Shoulder Instructions       General Comments      Pertinent Vitals/ Pain       Pain Assessment Pain Assessment: Faces Faces Pain Scale: Hurts little more Pain Location: With finger ROM and pressure Pain Descriptors / Indicators: Grimacing Pain Intervention(s): Monitored during session  Home Living                                          Prior Functioning/Environment              Frequency  Min 2X/week        Progress Toward Goals  OT Goals(current goals can now be found in  the care plan section)  Progress towards OT goals: Goals drowngraded-see care plan  Acute Rehab OT Goals Patient Stated Goal: use right hand OT Goal Formulation: With patient Time For Goal Achievement: 02/09/23 Potential to Achieve Goals: Good  Plan Discharge plan needs to be updated    Co-evaluation          OT goals addressed during session: Strengthening/ROM      AM-PAC OT "6 Clicks" Daily Activity     Outcome Measure   Help from another person eating meals?: A Little Help from another person taking care of personal grooming?: A Little Help from another person toileting, which includes using toliet, bedpan, or urinal?: A Little Help from another person bathing (including washing, rinsing, drying)?: A Little Help from another person to put on and taking off regular upper body clothing?: A Little Help from another person to put on and taking off regular lower body clothing?: A Little 6 Click Score: 18    End of Session    OT Visit Diagnosis: Pain;History of falling (Z91.81);Repeated falls (R29.6);Muscle weakness (generalized) (M62.81);Unsteadiness on feet (R26.81)   Activity Tolerance Patient tolerated treatment well   Patient Left in bed;with call bell/phone within reach;with nursing/sitter in room   Nurse Communication Mobility status        Time: [id number] OT Time Calculation (min): 13 min  Charges: OT General Charges $OT Visit: 1 Visit OT Treatments $Therapeutic Exercise: 8-22 mins  Gustavo Lah, OTR/L [place]  Office [phone]   Lenward Chancellor 02/04/2023, 12:47 PM

## 2023-02-04 NOTE — Plan of Care (Signed)
  Problem: Health Behavior/Discharge Planning: Goal: Ability to manage health-related needs will improve Outcome: Progressing   Problem: Clinical Measurements: Goal: Ability to maintain clinical measurements within normal limits will improve Outcome: Progressing   

## 2023-02-04 NOTE — Consult Note (Addendum)
[facility] Face-to-Face Psychiatry Consult    Patient Identification: Nathaniel Warren MRN:  [id number] Principal Diagnosis: Spontaneous hematoma of lower leg Diagnosis:  Principal Problem:   Spontaneous hematoma of lower leg Active Problems:   MDD (major depressive disorder), recurrent severe, without psychosis ([place])   Hyponatremia   Tobacco abuse   Alcoholic cirrhosis of liver with ascites ([place])   Suicidal ideations   Total Time spent with patient: 20 minutes  Subjective:   Nathaniel Warren is a 57 year old male with right hand injury, thigh hematoma.  he has a psychiatric h/o bipolar d/o vs mdd, gad, ptsd, alcohol use disorder.   Initial:  Nathaniel Warren is a 57 y.o. male patient admitted with .hx for polysubstance abuse, ETOH use, nerve compression L5-S1, MDD, suicide attempt x 4-who endorses suicidal ideations, and intent.  He is guarded and does not disclose the plan.  He is triggered by his chronic nerve pain, financial losses and medical comorbidities. Patient is disgruntled and frustrated throughout the majority of the evaluation. He ruminates and perseverates about multiple psychosocial stressors with a large contribution being finances, lack of access, and underlying mood disorder.  Patient with feelings of hopelessness and despair, impaired judgement and lack of insight presents an acutely high safety risk and continues to be appropriate for inpatient psychiatric admission where he can be monitored for safety and stabilized on medications. He meets criteria for IVC which will be upheld at this time.    He was alert, oriented, and cooperative with interview. Discussed numerous medical setbacks since last evaluation, pt seems to be taking well (uses humor as mature defense mechanism). Future oriented throughout exam as evidenced by discussion about rest of medical treatment and things he will need before he goes home (assistance with bills, food, etc). States he is sleeping much better but  has concerns over it being "artificial"; noted that this may improve when he is in charge of timing of PM meds and can time it to his sleep cycle.  Notably over last 3ish visits has denied suicidal ideation (although generally not contracting for safety) and denied again today - able now to discuss who he would reach out to if he developed worsening suicidal ideation. Had long and frank discussion on recommendation for inpt psychiatric hospitalization, which was initially made on presentation and upheld since; pt maintains that he does not need that level of care right now. No untoward s/e on low dose quetiapine.  Noted to have ongoing broadening of affect, smiling and laughing at times despite setback. Aware of need for PT and proudly stated he had participated . No HI, AH/VH.     Spoke to friend Nathaniel Warren w/ pt consent 2/23 He was aware that Kindle was being seen by psychiatry. Caretaker is 85something years old, has been taking care of him for a year and a half. He has been generally laying at home, sick, refusing to take care of himself. He pays for most of pt's medications and needs out of his fixed income. He shares similar concerns about inability to get SSD. He does share that he was unable to pay for pt's mental health medications. He has mentioned suicide to [place] several times in the context of feeling like a burden and being better off dead. He has just now opened up to [place] about how he has been feeling. He had to force Nathaniel Warren to come to the hospital to start with, he is aware that probably doesn't have long to live  regardless of medical intervention. He hasn't seen much of an improvement in pt's mental health, thinks he is putting on a front for mental health workers. He does not want Nathaniel Warren to leave the hospital until he is 100% stable because of how hard it was to get him to go to the hospital - had to threaten him with eviction. Friend asks about hospice. Nathaniel Warren thinks a psych hospital would be  helpful  but understands [place] is not medically stable to think about that. Nathaniel Warren notes that Nathaniel Warren does take his medication and responds well when he can afford it. Has had at least 3 falls in the house.   2/28  Called and discussed rescinding IVC for inpt psych. Nathaniel Warren has ongoing concerns about pt's likelyhood of decompensation if his basic needs (food, healthcare) are not met, and some concerns that pt is putting on a good front, but all of these are more chronic than acute. He has not endorsed suicidal ideations in last several days. Expresses general dissatisfaction with care (no one was called when pt rushed into surgery, pt has not seen a SW); validated these feelings as appropriate.    Past Psychiatric History: bipolar d/o vs mdd, gad, ptsd, alcohol use disorder.   Risk to Self:  yes  Risk to Others:  impulsive but denying HI  Prior Inpatient Therapy:   Prior Outpatient Therapy:    Past Medical History:  Past Medical History:  Diagnosis Date   Depression    Hernia, abdominal    Liver failure ([place])    Seizures ([place])    Stroke [facility])     Past Surgical History:  Procedure Laterality Date   ANKLE SURGERY     ANKLE SURGERY Right    HERNIA REPAIR     INCISION AND DRAINAGE OF WOUND Left 02/03/2023   Procedure: IRRIGATION AND DEBRIDEMENT WOUND LEFT RING FINGER POSSIBLE AMPUTATION;  Surgeon: Orene Desanctis, MD;  Location: [facility];  Service: Orthopedics;  Laterality: Left;   SPLENECTOMY     Family History:  Family History  Problem Relation Age of Onset   COPD Mother    Diabetes Father    Stroke Maternal Grandmother    Alzheimer's disease Maternal Grandfather    Cancer Paternal Grandmother    Stomach cancer Paternal Grandfather    Hypertension Other    Colon cancer Neg Hx    Esophageal cancer Neg Hx    Colon polyps Neg Hx    Inflammatory bowel disease Neg Hx    Liver disease Neg Hx    Pancreatic cancer Neg Hx    Rectal cancer Neg Hx    Family Psychiatric  History: did not  report   Social History:  Social History   Substance and Sexual Activity  Alcohol Use Not Currently     Social History   Substance and Sexual Activity  Drug Use Yes   Types: Marijuana    Social History   Socioeconomic History   Marital status: Single    Spouse name: Not on file   Number of children: 2   Years of education: 14   Highest education level: Not on file  Occupational History   Occupation: Nature conservation officer  Tobacco Use   Smoking status: Every Day    Packs/day: 1.00    Years: 30.00    Total pack years: 30.00    Types: Cigarettes   Smokeless tobacco: Former  Scientific laboratory technician Use: Never used  Substance and Sexual Activity   Alcohol use: Not  Currently   Drug use: Yes    Types: Marijuana   Sexual activity: Yes    Comment:  wife had hysterectomy  Other Topics Concern   Not on file  Social History Narrative   ** Merged History Encounter **    Right handed   Unable to work   Lives with roommate and cousin   Drinks caffeine   One floor house   Social Determinants of Health   Financial Resource Strain: High Risk (07/15/2022)   Overall Financial Resource Strain (CARDIA)    Difficulty of Paying Living Expenses: Very hard  Food Insecurity: Food Insecurity Present (01/23/2023)   Hunger Vital Sign    Worried About Running Out of Food in the Last Year: Sometimes true    Ran Out of Food in the Last Year: Sometimes true  Transportation Needs: Unmet Transportation Needs (01/23/2023)   PRAPARE - Transportation    Lack of Transportation (Medical): Yes    Lack of Transportation (Non-Medical): Yes  Physical Activity: Inactive (07/15/2022)   Exercise Vital Sign    Days of Exercise per Week: 0 days    Minutes of Exercise per Session: 0 min  Stress: Stress Concern Present (07/15/2022)   [place] of Stress : Very much  Social Connections: Socially Isolated (07/15/2022)   Social Connection  and Isolation Panel [NHANES]    Frequency of Communication with Friends and Family: More than three times a week    Frequency of Social Gatherings with Friends and Family: Once a week    Attends Religious Services: Never    Marine scientist or Organizations: No    Attends Archivist Meetings: Never    Marital Status: Divorced   Additional Social History:    Allergies:  No Known Allergies  Labs:  Results for orders placed or performed during the hospital encounter of 01/22/23 (from the past 48 hour(s))  CBC     Status: Abnormal   Collection Time: 02/03/23  4:22 AM  Result Value Ref Range   WBC 12.1 (H) 4.0 - 10.5 K/uL   RBC 2.49 (L) 4.22 - 5.81 MIL/uL   Hemoglobin 9.1 (L) 13.0 - 17.0 g/dL   HCT 26.0 (L) 39.0 - 52.0 %   MCV 104.4 (H) 80.0 - 100.0 fL   MCH 36.5 (H) 26.0 - 34.0 pg   MCHC 35.0 30.0 - 36.0 g/dL   RDW 18.0 (H) 11.5 - 15.5 %   Platelets 95 (L) 150 - 400 K/uL    Comment: Immature Platelet Fraction may be clinically indicated, consider ordering this additional test [id number] CONSISTENT WITH PREVIOUS RESULT REPEATED TO VERIFY    nRBC 0.3 (H) 0.0 - 0.2 %    Comment: Performed at [facility], [place] [address] [place], [place] [address]  Protime-INR     Status: Abnormal   Collection Time: 02/03/23  4:22 AM  Result Value Ref Range   Prothrombin Time 20.3 (H) 11.4 - 15.2 seconds   INR 1.8 (H) 0.8 - 1.2    Comment: (NOTE) INR goal varies based on device and disease states. Performed at [facility], [place] [address] [place], [place] [address]   Aerobic/Anaerobic Culture w Gram Stain (surgical/deep wound)     Status: None (Preliminary result)   Collection Time: 02/03/23  6:07 PM   Specimen: PATH Other; Body Fluid  Result Value Ref Range   Specimen Description      WOUND  L RING FINGER Performed at [place] [address] [place], [place] [address]    Special Requests      NONE Performed  at [facility], [place] [address] [place], [place] [address]    Gram Stain      RARE WBC PRESENT, PREDOMINANTLY PMN FEW GRAM POSITIVE COCCI FEW GRAM NEGATIVE RODS RARE GRAM POSITIVE RODS    Culture      NO GROWTH < 12 HOURS Performed at [facility], [place] [address] [place], [place] [address]    Report Status PENDING   Aerobic/Anaerobic Culture w Gram Stain (surgical/deep wound)     Status: None (Preliminary result)   Collection Time: 02/03/23  6:25 PM   Specimen: PATH Other; Body Fluid  Result Value Ref Range   Specimen Description      WOUND LH Performed at [place] [address] [place], [place] [address]    Special Requests      NONE Performed at [facility], [place] [address] [place], [place] [address]    Gram Stain      RARE WBC PRESENT, PREDOMINANTLY PMN NO ORGANISMS SEEN    Culture      NO GROWTH < 12 HOURS Performed at [place] [address], [place], [place] [address]    Report Status PENDING   CBC     Status: Abnormal   Collection Time: 02/04/23  4:04 AM  Result Value Ref Range   WBC 6.6 4.0 - 10.5 K/uL   RBC 2.72 (L) 4.22 - 5.81 MIL/uL   Hemoglobin 10.0 (L) 13.0 - 17.0 g/dL   HCT 28.6 (L) 39.0 - 52.0 %   MCV 105.1 (H) 80.0 - 100.0 fL   MCH 36.8 (H) 26.0 - 34.0 pg   MCHC 35.0 30.0 - 36.0 g/dL   RDW 18.2 (H) 11.5 - 15.5 %   Platelets 107 (L) 150 - 400 K/uL   nRBC 0.0 0.0 - 0.2 %    Comment: Performed at [facility], [place] [address], [place], [place] [address]  Comprehensive metabolic panel     Status: Abnormal   Collection Time: 02/04/23  4:04 AM  Result Value Ref Range   Sodium 132 (L) 135 - 145 mmol/L   Potassium 4.2 3.5 - 5.1 mmol/L   Chloride 100 98 - 111 mmol/L   CO2 23 22 - 32 mmol/L   Glucose, Bld 155 (H) 70 - 99 mg/dL    Comment: Glucose reference range applies only to samples taken after fasting for at least 8 hours.   BUN 15 6 - 20 mg/dL    Creatinine, Ser 0.89 0.61 - 1.24 mg/dL   Calcium 8.6 (L) 8.9 - 10.3 mg/dL   Total Protein 6.3 (L) 6.5 - 8.1 g/dL   Albumin 2.1 (L) 3.5 - 5.0 g/dL   AST 59 (H) 15 - 41 U/L   ALT 22 0 - 44 U/L   Alkaline Phosphatase 141 (H) 38 - 126 U/L   Total Bilirubin 6.8 (H) 0.3 - 1.2 mg/dL   GFR, Estimated >60 >60 mL/min    Comment: (NOTE) Calculated using the CKD-EPI Creatinine Equation (2021)    Anion gap 9 5 - 15    Comment: Performed at [facility], [place] [address], [place], [place] [address]    Current Facility-Administered Medications  Medication Dose Route Frequency Provider Last Rate Last Admin   0.9 %  sodium chloride infusion  250 mL Intravenous PRN Toy Baker, MD       cefTRIAXone (ROCEPHIN) 2 g in sodium chloride 0.9 % 100 mL IVPB  2 g Intravenous Q24H Shelly Coss, MD   Stopped at [id number]   folic acid (FOLVITE) tablet 1 mg  1 mg Oral Daily Doutova, Anastassia, MD   1 mg at 02/04/23 0818   morphine (PF) 2 MG/ML injection 2 mg  2 mg Intravenous Q3H PRN Toy Baker, MD   2 mg at 02/02/23 1753   ondansetron (ZOFRAN) injection 4 mg  4 mg Intravenous Q6H PRN Shelly Coss, MD   4 mg at 01/30/23 1038   oxyCODONE-acetaminophen (PERCOCET/ROXICET) 5-325 MG per tablet 2 tablet  2 tablet Oral Q4H PRN Nita Sells, MD   2 tablet at 02/04/23 1337   PARoxetine (PAXIL) tablet 10 mg  10 mg Oral Daily Linnea Todisco A   10 mg at 02/03/23 2237   polyethylene glycol (MIRALAX / GLYCOLAX) packet 17 g  17 g Oral Daily Shelly Coss, MD   17 g at 02/01/23 0840   QUEtiapine (SEROQUEL) tablet 25 mg  25 mg Oral QHS Leor Whyte A   25 mg at 02/03/23 2236   risperiDONE (RISPERDAL M-TABS) disintegrating tablet 2 mg  2 mg Oral Q8H PRN Suella Broad, FNP       And   ziprasidone (GEODON) injection 20 mg  20 mg Intramuscular PRN Starkes-Perry, Gayland Curry, FNP       sodium chloride flush (NS) 0.9 % injection 3 mL  3 mL Intravenous Q12H Doutova,  Anastassia, MD   3 mL at 02/04/23 0814   sodium chloride flush (NS) 0.9 % injection 3 mL  3 mL Intravenous PRN Doutova, Nyoka Lint, MD       thiamine (VITAMIN B1) tablet 100 mg  100 mg Oral Daily Doutova, Anastassia, MD   100 mg at 02/04/23 0818   vancomycin (VANCOCIN) IVPB 1000 mg/200 mL premix  1,000 mg Intravenous Q12H Lynelle Doctor, RPH   Stopped at 02/04/23 1045    Musculoskeletal: Strength & Muscle Tone: Laying in bed   Gait & Station: Laying in bed   Patient leans: Laying in bed     Psychiatric Specialty Exam:  Presentation  General Appearance:  Appropriate for Environment  Eye Contact: Fair  Speech: Normal Rate  Speech Volume: Normal  Handedness: Ambidextrous   Mood and Affect  Mood: -- (Good for the circumstances)  Affect: Appropriate; Congruent; Full Range   Thought Process  Thought Processes: Linear  Descriptions of Associations:Intact  Orientation:Full (Time, Place and Person)  Thought Content: Suicidal History of Schizophrenia/Schizoaffective disorder:No  Duration of Psychotic Symptoms:No data recorded Hallucinations: denies Ideas of Reference: denies Suicidal Thoughts: Yes, denies plan  Homicidal Thoughts: denies   Sensorium  Memory: Immediate Good; Remote Good; Recent Good  Judgment: Fair  Insight: Fair   Community education officer  Concentration: Fair  Attention Span: Good  Recall: Good  Fund of Knowledge: Good  Language: Good   Psychomotor Activity  Psychomotor Activity: Psychomotor Activity: Normal    restless, rocking back and forth   Assets  Assets: Communication Skills; Desire for Improvement   Sleep  Sleep: Fair Sleep: Good      Physical Exam: Minimal change since last exam Physical Exam Vitals reviewed.  HENT:     Head: Normocephalic and atraumatic.  Skin:    Coloration: Skin is jaundiced.  Neurological:     General: No focal deficit present.     Mental Status: He is  alert and  oriented to person, place, and time.  Psychiatric:        Attention and Perception: He does not perceive auditory or visual hallucinations.        Speech: Speech normal.        Behavior: Behavior is cooperative.        Thought Content: Thought content is not paranoid.        Judgment: Judgment is not impulsive.    Review of Systems  Constitutional: Negative.   Musculoskeletal:        Weakness  Skin:  Positive for rash.   Blood pressure (!) 124/58, pulse 69, temperature 98.6 F (37 C), temperature source Oral, resp. rate 18, height '5\' 9"'$  (1.753 m), weight 77.7 kg, SpO2 93 %. Body mass index is 25.3 kg/m.  Treatment Plan Summary:  Assessment: -- likely bipolar II  -GAD -PTSD -Hx of Alcohol use disorder This is a patient with a history of bipolar disorder and multiple lifetime suicide attempts here with end stage cirrhosis and a hand hemotoma. Has made many suicidal statements early in course of hospitalization, and initially refused every medicaiton offered out of a sense of futility due to cost. Over the past week (since about 2/22), he has consented to paxil and quetiapine, and has been sleeping much better with attendant improvements in mood, affect, and motivation to participate in PT. We have addressed numerous risk factors for suicidality (insomnia, untreated depression) while medically admitted; he remains at chronic high risk due to psychiatirc history and terminal diagnosis. At this point, pt no longer meets criteria for IVC in [place], which requires pt be an acute risk to self, and it was rescinded on 2/28. Psychiatry will continue to follow while admitted to provide further brief supportive psychotherapy, medication management (do not anticipate major future changes), and assistance in dc planning.   Notably, will likely require lower doses of psychotropic meds which are mostly metabolized by CYP system. Seems to be responding well to lower doses of medications than previous, no  indication to increase.   Plan: --  stop ivc, sitter  -- c paxil 10 mg -- c quetiapine 25 mg QHS - consult SW to talk with patient, he would like some direction/assistance in regards to accessing social services  - consider palliative consult  (if agreeable to/qualifies for hospice, may be able to meet many of his needs)    Psychiatry will continue to follow.   Disposition: No evidence of imminent risk to self or others at present.   Patient does not meet criteria for psychiatric inpatient admission. Supportive therapy provided about ongoing stressors. Discussed crisis plan, support from social network, calling 911, coming to the Emergency Department, and calling Suicide Hotline.  Kerrie Buffalo Brindley Madarang 02/04/2023 6:39 PM

## 2023-02-04 NOTE — Progress Notes (Signed)
[facility] PROGRESS NOTE  Nathaniel Warren (DOB: Oct 17, 1966) [id number] PCP: [name], NP  Brief Narrative: Patient is a 57 year old male with history of alcohol abuse/cirrhosis of liver, depression, chronic right ankle pain, suicidal attempts presented to the emergency department with complaint of left groin swelling, right arm swelling.  Found to have hematoma of the left groin as well as swelling of the right arm consistent with hematoma.  CT hand showed diffuse swelling without any abscess.  Due to suicidal ideation, psychiatry was consulted.  Hand surgery also consulted.  Swelling  improved.  Hematoma remains stable.  Given few days of vitamin K given this hospitalization for elevated INR.  PT/OT recommending acute inpatient rehab/SNF but looks difficult due to his psychiatric history.  Psychiatry recommending inpatient psychiatric admission.  TOC following.  Hospital course remarkable for persistent pain and swelling of the right hand,finding of necrotic left 4th finger tip.  Hand surgery following, performed left ring finger I&D, nail plate removal, nailbed repair, soft tissue rearrangement <10sqcm for coverage of bone and left dorsal hand abscess I&D on 2/27.  Subjective: Pain controlled, swelling in hands stable/improving. No fevers.   Objective: BP (!) 124/58 (BP Location: Left Leg)   Pulse 69   Temp 98.6 F (37 C) (Oral)   Resp 18   Ht '5\' 9"'$  (1.753 m)   Wt 77.7 kg   SpO2 93%   BMI 25.30 kg/m   Gen: No distress Pulm: Clear, nonlabored  CV: RRR, no MRG GI: Soft, NT, ND, +BS Neuro: Alert and oriented. No new focal deficits. Ext: Warm, dry. Left dorsal hand with oozing bleeding from linear incision site when bandage taken off, hemostatic when replaced. Right dorsum with swelling nontender. Diffuse hyperpigmented ecchymoses on upper extremities. Left 4th digit with bulky dressing, dried blood. Skin: No other rashes, lesions or ulcers on visualized skin    Assessment & Plan: Principal Problem:   Spontaneous hematoma of lower leg Active Problems:   Alcoholic cirrhosis of liver with ascites (HCC)   MDD (major depressive disorder), recurrent severe, without psychosis ([place])   Hyponatremia   Tobacco abuse   Suicidal ideations  Right arm/left groin swelling consistent with hematoma/necrotic left 4th finger tip:  - No indication for surgery for hematoma and expected slow reabsorption as per hand surgery.  - For coagulopathy, given few doses of vitamin K, FFP 2 units - Continue elevation, ice application, pain medication. - Leukocytosis resolved on vancomycin/ceftriaxone started 2/24. Will tailor abx to wound Cx results. (+gram stain for GPC, GNR, GPR) - Monitor blood cultures (group C strep in 1 of 2 (anaerobic bottle) on 2/24).  - Follow-up CT scan of the right hand done on 2/25 without evidence of abscess or collection. - He was also found to have worsening of the ulcer on the left fourth fingertip, now appears necrotic, foul-smelling.  MRI of the finger does not show any abscess or osteomyelitis, s/p I&D with cultures on 2/27 by Dr. Greta Doom.   Suicidal attempt/PTSD: Psychiatry following.  Current IVC.  Plan is to discharge to inpatient psychiatry when possible.    Chronic pain syndrome: Has history of L5-S1 nerve compression with sciatica, chronic knee pain with foot drop.  Continue pain management, supportive care.  - He may need to increase his mobility while inpatient before being discharged to inpatient psychiatry. Continue PT/OT therapy while inpatient   Spermatocele:  - Recommend ultrasound in 3 months   History of alcohol abuse/liver cirrhosis: Quit in 2023.  Elevated INR.  Given few days of vitamin K.  Has mild elevated liver enzymes along with bilirubin which is stable. - Recommend to follow-up with GI/hepatology as an outpatient.Follows with [facility]. - Likely to restart lasix/spiro soon though BP still soft at times.    Macrocytic anemia/thrombocytopenia: This is secondary to chronic alcohol abuse, cirrhosis. continue thiamine /folic acid.  Hemoglobin stable.  Normal folate, vitamin B12 high - Hgb stable postoperatively, plt also stable.   Nausea, vomiting: Had nausea with vomiting on 2/21.  Resolved after having a big bowel movement     Patrecia Pour, MD [facility] [email or web address] 02/04/2023, 4:10 PM

## 2023-02-04 NOTE — Progress Notes (Signed)
Physical Therapy Treatment Patient Details Name: Nathaniel Warren MRN: [id number] DOB: 1965/12/13 Today's Date: 02/04/2023   History of Present Illness 57 yo male with onset of grion and RT dorsal hand bruising and edema with reported light trauma during a fall.  Has h/o R foot drop without AFO. + for suicidal ideations.   PMHx:  etoh abuse, depression with h/o 4 suicide attempts, stroke, seizures, liver failure, R ankle fracture with ORIF, atherosclerosis, cirrhosis, diverticulosis, osteopenia    PT Comments    Pt agreeable to work with therapy on today. He participated well. He tolerated activity well. Safety questionable at times. Will continue to follow during hospital stay.    Recommendations for follow up therapy are one component of a multi-disciplinary discharge planning process, led by the attending physician.  Recommendations may be updated based on patient status, additional functional criteria and insurance authorization.  Follow Up Recommendations   (Inpt psych?)     Assistance Recommended at Discharge Intermittent Supervision/Assistance  Patient can return home with the following A little help with walking and/or transfers;Direct supervision/assist for medications management;Assistance with cooking/housework;Assist for transportation;A little help with bathing/dressing/bathroom;Help with stairs or ramp for entrance   Equipment Recommendations   (continuing to assess)    Recommendations for Other Services       Precautions / Restrictions Precautions Precautions: Fall Precaution Comments: Suicide precautions, 1:1 sitter; R foot drop (chronic) Restrictions Weight Bearing Restrictions: No Other Position/Activity Restrictions: Limited RT hand WB due to pain     Mobility  Bed Mobility Overal bed mobility: Modified Independent                  Transfers Overall transfer level: Needs assistance Equipment used: Right platform walker Transfers: Sit to/from  Stand Sit to Stand: Min guard, From elevated surface           General transfer comment: Close Min guard for safety. Increased time. Cues provided for safety.    Ambulation/Gait Ambulation/Gait assistance: Min guard Gait Distance (Feet): 185 Feet Assistive device: Right platform walker Gait Pattern/deviations: Step-through pattern, Steppage, Narrow base of support       General Gait Details: MIn guard for safety. No LOB with RW use. 2 brief standing rest breaks   Stairs             Wheelchair Mobility    Modified Rankin (Stroke Patients Only)       Balance Overall balance assessment: History of Falls, Needs assistance         Standing balance support: During functional activity, Reliant on assistive device for balance Standing balance-Leahy Scale: Poor                              Cognition Arousal/Alertness: Awake/alert Behavior During Therapy: WFL for tasks assessed/performed Overall Cognitive Status: Within Functional Limits for tasks assessed                                 General Comments: Still questionable safey at times        Exercises      General Comments        Pertinent Vitals/Pain Pain Assessment Pain Assessment: Faces Faces Pain Scale: Hurts little more Pain Location: bil hands Pain Intervention(s): Monitored during session    Home Living  Prior Function            PT Goals (current goals can now be found in the care plan section) Progress towards PT goals: Progressing toward goals    Frequency    Min 3X/week      PT Plan Frequency needs to be updated    Co-evaluation       OT goals addressed during session: Strengthening/ROM      AM-PAC PT "6 Clicks" Mobility   Outcome Measure  Help needed turning from your back to your side while in a flat bed without using bedrails?: None Help needed moving from lying on your back to sitting on the side  of a flat bed without using bedrails?: None Help needed moving to and from a bed to a chair (including a wheelchair)?: A Little Help needed standing up from a chair using your arms (e.g., wheelchair or bedside chair)?: A Little Help needed to walk in hospital room?: A Little Help needed climbing 3-5 steps with a railing? : A Lot 6 Click Score: 19    End of Session   Activity Tolerance: Patient tolerated treatment well Patient left: in bed;with call bell/phone within reach;with family/visitor present   PT Visit Diagnosis: Muscle weakness (generalized) (M62.81);Other abnormalities of gait and mobility (R26.89);History of falling (Z91.81);Difficulty in walking, not elsewhere classified (R26.2);Unsteadiness on feet (R26.81)     Time: [id number] PT Time Calculation (min) (ACUTE ONLY): 34 min  Charges:  $Gait Training: 23-37 mins                         Doreatha Massed, PT Acute Rehabilitation  Office: [phone]

## 2023-02-05 DIAGNOSIS — F332 Major depressive disorder, recurrent severe without psychotic features: Secondary | ICD-10-CM

## 2023-02-05 LAB — CULTURE, BLOOD (ROUTINE X 2)
Culture: NO GROWTH
Special Requests: ADEQUATE

## 2023-02-05 MED ORDER — SPIRONOLACTONE 25 MG PO TABS
50.0000 mg | ORAL_TABLET | Freq: Every day | ORAL | Status: DC
  Administered 2023-02-05: 50 mg via ORAL
  Filled 2023-02-05: qty 2

## 2023-02-05 MED ORDER — FUROSEMIDE 20 MG PO TABS
20.0000 mg | ORAL_TABLET | Freq: Every day | ORAL | Status: DC
  Administered 2023-02-05: 20 mg via ORAL
  Filled 2023-02-05: qty 1

## 2023-02-05 NOTE — TOC Progression Note (Signed)
Transition of Care [facility]) - Progression Note    Patient Details  Name: Nathaniel Warren MRN: [id number] Date of Birth: April 03, 1966  Transition [facility]) CM/SW Contact  Servando Snare, LCSW Phone Number: 02/05/2023, 9:04 AM  Clinical Narrative:    Per psych, patient no longer needing inpatient psych. TOC will follow for needs.    Expected Discharge Plan: Psychiatric Hospital Barriers to Discharge: Continued Medical Work up  Expected Discharge Plan and Services In-house Referral: Clinical Social Work Discharge Planning Services: NA Post Acute Care Choice: NA Living arrangements for the past 2 months: Single Family Home                 DME Arranged: N/A DME Agency: NA                   Social Determinants of Health (SDOH) Interventions [place]: Food Insecurity Present (01/23/2023)  Housing: Medium Risk (01/23/2023)  Transportation Needs: Unmet Transportation Needs (01/23/2023)  Utilities: Not At Risk (01/23/2023)  Alcohol Screen: Medium Risk (10/27/2017)  Depression (PHQ2-9): High Risk (12/18/2022)  Financial Resource Strain: High Risk (07/15/2022)  Physical Activity: Inactive (07/15/2022)  Social Connections: Socially Isolated (07/15/2022)  Stress: Stress Concern Present (07/15/2022)  Tobacco Use: High Risk (02/04/2023)    Readmission Risk Interventions     No data to display

## 2023-02-05 NOTE — Progress Notes (Signed)
[facility] PROGRESS NOTE  Nathaniel Warren (DOB: [birth year]) [id number] PCP: [name], NP  Brief Narrative: Patient is a 57 year old male with history of alcohol abuse/cirrhosis of liver, depression, chronic right ankle pain, suicidal attempts presented to the emergency department with complaint of left groin swelling, right arm swelling.  Found to have hematoma of the left groin as well as swelling of the right arm consistent with hematoma.  CT hand showed diffuse swelling without any abscess.  Due to suicidal ideation, psychiatry was consulted.  Hand surgery also consulted.  Swelling  improved.  Hematoma remains stable.  Given few days of vitamin K given this hospitalization for elevated INR.  PT/OT recommending acute inpatient rehab/SNF but looks difficult due to his psychiatric history.  Psychiatry recommending inpatient psychiatric admission.  TOC following.  Hospital course remarkable for persistent pain and swelling of the right hand,finding of necrotic left 4th finger tip.  Hand surgery following, performed left ring finger I&D, nail plate removal, nailbed repair, soft tissue rearrangement <10sqcm for coverage of bone and left dorsal hand abscess I&D on 2/27.  Subjective: Feels overall improving, no SI, has plans for when he's stable to discharge. Pain controlled. Mood better.   Objective: BP 131/65 (BP Location: Right Arm)   Pulse (!) 55   Temp (!) 97.5 F (36.4 C) (Oral)   Resp 18   Ht '5\' 9"'$  (1.753 m)   Wt 77.7 kg   SpO2 98%   BMI 25.30 kg/m   Gen: Nontoxic chronically ill-appearing male in no distress Pulm: Clear, nonlabored  CV: Regular borderline bradycardia GI: Soft, NT, ND, +BS  Neuro: Alert and oriented. No new focal deficits. Ext: Warm, dry. Left 4th digit dressing bulky c/d/I and right hand wound healing with underlying nontender fluctuance.  Skin: Jaundice stable. RUE with dense bruising that is stable. No new rashes, lesions or ulcers on visualized  skin   Assessment & Plan: Principal Problem:   Spontaneous hematoma of lower leg Active Problems:   Alcoholic cirrhosis of liver with ascites (HCC)   MDD (major depressive disorder), recurrent severe, without psychosis ([place])   Hyponatremia   Tobacco abuse   Suicidal ideations  Right arm/left groin swelling consistent with hematoma/necrotic left 4th finger tip:  - s/p vitamin K, FFP 2 units - Continue elevation, ice application, pain medication. - Monitor blood cultures (remains only group C strep in 1 of 2 (anaerobic bottle) on 2/24).  - Follow-up CT scan of the right hand done on 2/25 without evidence of abscess or collection. there is a fairly significant nontender fluctuant area coalescing on exam now. Messaged Dr. Greta Doom for opinion Re: drainage vs. observation. - He was also found to have worsening of the ulcer on the left fourth fingertip, now appears necrotic, foul-smelling.  MRI of the finger does not show any abscess or osteomyelitis, s/p I&D with cultures on 2/27 by Dr. Greta Doom. Cultures reincubated. Leukocytosis resolved on vancomycin/ceftriaxone started 2/24. Will tailor abx to wound Cx results. (+gram stain for GPC, GNR, GPR)  Suicide attempt/PTSD:  - Continue paroxetine and quetiapine which have improved symptoms.  - No longer requires IVC or inpatient psychiatric hospitalization per psychiatry. IVC rescinded. Sitter discontinued. Pt appears stable.   Chronic pain syndrome: Has history of L5-S1 nerve compression with sciatica, chronic knee pain with foot drop.  Continue pain management, supportive care.  - He may need to increase his mobility while inpatient before being discharged. He has no way of paying for SNF level rehabilitation and will  likely be discharged home.   Spermatocele:  - Recommend ultrasound in 3 months   History of alcohol abuse/liver cirrhosis: Quit in 2023.  Elevated INR.  Given few days of vitamin K.  Has mild elevated liver enzymes along with bilirubin  which is stable. - Recommend to follow-up with GI/hepatology as an outpatient. Follows with [facility]. - Restart lasix/spiro at 20/50 doses. Monitor VS's and BMP.   Macrocytic anemia/thrombocytopenia: This is secondary to chronic alcohol abuse, cirrhosis. continue thiamine /folic acid.  Hemoglobin stable.  Normal folate, vitamin B12 high - Hgb stable postoperatively, plt also stable. Monitor in AM.   Nausea, vomiting: Had nausea with vomiting on 2/21.  Resolved after having a big bowel movement    Patrecia Pour, MD [facility] [email or web address] 02/05/2023, 11:51 AM

## 2023-02-05 NOTE — Anesthesia Postprocedure Evaluation (Signed)
Anesthesia Post Note  Patient: Nathaniel Warren  Procedure(s) Performed: IRRIGATION AND DEBRIDEMENT WOUND LEFT RING FINGER POSSIBLE AMPUTATION (Left)     Patient location during evaluation: PACU Anesthesia Type: General Level of consciousness: awake and alert Pain management: pain level controlled Vital Signs Assessment: post-procedure vital signs reviewed and stable Respiratory status: spontaneous breathing, nonlabored ventilation, respiratory function stable and patient connected to nasal cannula oxygen Cardiovascular status: blood pressure returned to baseline and stable Postop Assessment: no apparent nausea or vomiting Anesthetic complications: no  No notable events documented.  Last Vitals:  Vitals:   02/04/23 1950 02/05/23 0414  BP: 104/61 131/65  Pulse: 71 (!) 55  Resp: 18 18  Temp: 37.1 C (!) 36.4 C  SpO2: 95% 98%    Last Pain:  Vitals:   02/05/23 0414  TempSrc: Oral  PainSc:                  Jkayla Spiewak S

## 2023-02-05 NOTE — TOC Progression Note (Addendum)
Transition of Care [facility]) - Progression Note    Patient Details  Name: Nathaniel Warren MRN: [id number] Date of Birth: 1966-03-20  Transition of Care [facility]) CM/SW Contact  Servando Snare, LCSW Phone Number: 02/05/2023, 11:41 AM  Clinical Narrative:    TOC met with patient at bedside to determine needs. Patient reports that he lives with friends when the weather is bad otherwise he is on the streets. Patient plans to dc back to friends house at dc. He reports his friends are helpful and willing to help him in anyway. Patient reports transportation is not an issue as his friends will drive him wherever he needs to go.   Patient primary concern is applying for social security  disability and medicaid. Patient reports he does not know where to start in the process. Patient reports he previously had food stamps. He reports that someone helped him at behavioral health with his renewal application and he never received anymore benefits after that. Patient reports he has reached out to his social worker on several occasions and several scheduled appointments that she did not show for.   TOC followed up with financial counseling and notified them that patients cel phone was out of service and his room phone would be the best contact. TOC to add instructions to patient AVS if he is not able to be screened while in the hospital.   OT rec SNF, no PT rec. Patient is uninsured and has no payor source.    Expected Discharge Plan: Psychiatric Hospital Barriers to Discharge: Continued Medical Work up  Expected Discharge Plan and Services In-house Referral: Clinical Social Work Discharge Planning Services: NA Post Acute Care Choice: NA Living arrangements for the past 2 months: Single Family Home                 DME Arranged: N/A DME Agency: NA                   Social Determinants of Health (SDOH) Interventions [place]: Food Insecurity Present (01/23/2023)  Housing:  Medium Risk (01/23/2023)  Transportation Needs: Unmet Transportation Needs (01/23/2023)  Utilities: Not At Risk (01/23/2023)  Alcohol Screen: Medium Risk (10/27/2017)  Depression (PHQ2-9): High Risk (12/18/2022)  Financial Resource Strain: High Risk (07/15/2022)  Physical Activity: Inactive (07/15/2022)  Social Connections: Socially Isolated (07/15/2022)  Stress: Stress Concern Present (07/15/2022)  Tobacco Use: High Risk (02/04/2023)    Readmission Risk Interventions     No data to display

## 2023-02-05 NOTE — Consult Note (Signed)
Major Psychiatry Consult   Reason for Consult:  suicidal Referring Physician:  Toy Warren Patient Identification: Nathaniel Warren MRN:  [id number] Principal Diagnosis: Spontaneous hematoma of lower leg Diagnosis:  Principal Problem:   Spontaneous hematoma of lower leg Active Problems:   MDD (major depressive disorder), recurrent severe, without psychosis ([place])   Hyponatremia   Tobacco abuse   Alcoholic cirrhosis of liver with ascites ([place])   Suicidal ideations   Total Time spent with patient: 30 minutes  Subjective:   HPI: Nathaniel Warren is a 57 y.o. male patient admitted with .hx for polysubstance abuse, ETOH use, nerve compression L5-S1, MDD, suicide attempt x 4-who endorses suicidal ideations, and intent.  He is guarded and does not disclose the plan.  He is triggered by his chronic nerve pain, financial losses and medical comorbidities. Patient is disgruntled and frustrated throughout the majority of the evaluation. He ruminates and perseverates about multiple psychosocial stressors with a large contribution being finances, lack of access, and underlying mood disorder.  Patient with feelings of hopelessness and despair, impaired judgement and lack of insight presents an acutely high safety risk and continues to be appropriate for inpatient psychiatric admission where he can be monitored for safety and stabilized on medications. He meets criteria for IVC which will be upheld at this time.   Assessment: Patient observed laying in bed. Appears slightly disheveled. Alert and oriented. Calm and cooperative. Dysphoric mood, blunt affect. Linear thought content. He endorses ongoing depression since Warren/11/13 in the context of death of son Nathaniel Warren) via overdose in September, father was severely ill in October, and then in Warren/11/13 he reports multiple falls and his public assistance was stopped resulting in his inability to access food. Describes a downward spiral since then. He denies  any alcohol use. No active outpatient mental health services; no therapy since adolescence. Expressed some interest in therapy but would like basic needs (shelter, food, access to healthcare) met before he would be able to focus on therapy. He denies any SI/HI/AVH. Currently taking Paxil; denies any side effects at this time. He denies any SI/HI/AVH. Contracts for safety and maintains he does not require inpatient psychiatric hospitalization at this time. IVC was rescinded 02/04/23. Psychiatry will continue to follow.   Per chart review:  Spoke to friend Nathaniel Warren w/ pt consent 2/23 He was aware that Nathaniel Warren was being seen by psychiatry. Caretaker is 70something years old, has been taking care of him for a year and a half. He has been generally laying at home, sick, refusing to take care of himself. He pays for most of pt's medications and needs out of his fixed income. He shares similar concerns about inability to get SSD. He does share that he was unable to pay for pt's mental health medications. He has mentioned suicide to Nathaniel Warren several times in the context of feeling like a burden and being better off dead. He has just now opened up to Nathaniel Warren about how he has been feeling. He had to force Nathaniel Warren to come to the hospital to start with, he is aware that probably doesn't have long to live regardless of medical intervention. He hasn't seen much of an improvement in pt's mental health, thinks he is putting on a front for mental health workers. He does not want Nathaniel Warren to leave the hospital until he is 100% stable because of how hard it was to get him to go to the hospital - had to threaten him with eviction. Friend asks about hospice.  Nathaniel Warren thinks a psych hospital would be helpful  but understands Nathaniel Warren is not medically stable to think about that. Nathaniel Warren notes that Nathaniel Warren does take his medication and responds well when he can afford it. Has had at least 3 falls in the house.    2/28  Called and discussed rescinding  IVC for inpt psych. Nathaniel Warren has ongoing concerns about pt's likelyhood of decompensation if his basic needs (food, healthcare) are not met, and some concerns that pt is putting on a good front, but all of these are more chronic than acute. He has not endorsed suicidal ideations in last several days. Expresses general dissatisfaction with care (no one was called when pt rushed into surgery, pt has not seen a SW); validated these feelings as appropriate.   Past Psychiatric History: bipolar d/o vs mdd, gad, ptsd, alcohol use disorder   Risk to Self:   Risk to Others:   Prior Inpatient Therapy:   Prior Outpatient Therapy:    Past Medical History:  Past Medical History:  Diagnosis Date   Depression    Hernia, abdominal    Liver failure ([place])    Seizures ([place])    Stroke ([place])     Past Surgical History:  Procedure Laterality Date   ANKLE SURGERY     ANKLE SURGERY Right    HERNIA REPAIR     INCISION AND DRAINAGE OF WOUND Left 2/27/Warren   Procedure: IRRIGATION AND DEBRIDEMENT WOUND LEFT RING FINGER POSSIBLE AMPUTATION;  Surgeon: Orene Desanctis, MD;  Location: [facility];  Service: Orthopedics;  Laterality: Left;   SPLENECTOMY     Family History:  Family History  Problem Relation Age of Onset   COPD Mother    Diabetes Father    Stroke Maternal Grandmother    Alzheimer's disease Maternal Grandfather    Cancer Paternal Grandmother    Stomach cancer Paternal Grandfather    Hypertension Other    Colon cancer Neg Hx    Esophageal cancer Neg Hx    Colon polyps Neg Hx    Inflammatory bowel disease Neg Hx    Liver disease Neg Hx    Pancreatic cancer Neg Hx    Rectal cancer Neg Hx    Family Psychiatric  History: not noted Social History:  Social History   Substance and Sexual Activity  Alcohol Use Not Currently     Social History   Substance and Sexual Activity  Drug Use Yes   Types: Marijuana    Social History   Socioeconomic History   Marital status: Single    Spouse name: Not  on file   Number of children: 2   Years of education: 14   Highest education level: Not on file  Occupational History   Occupation: Nature conservation officer  Tobacco Use   Smoking status: Every Day    Packs/day: 1.00    Years: 30.00    Total pack years: 30.00    Types: Cigarettes   Smokeless tobacco: Former  Scientific laboratory technician Use: Never used  Substance and Sexual Activity   Alcohol use: Not Currently   Drug use: Yes    Types: Marijuana   Sexual activity: Yes    Comment:  wife had hysterectomy  Other Topics Concern   Not on file  Social History Narrative   ** Merged History Encounter **    Right handed   Unable to work   Lives with roommate and cousin   Drinks caffeine   One floor house   Social  Determinants of Health   Financial Resource Strain: High Risk (07/15/2022)   Overall Financial Resource Strain (CARDIA)    Difficulty of Paying Living Expenses: Very hard  Food Insecurity: Food Insecurity Present (2/16/Warren)   Hunger Vital Sign    Worried About Running Out of Food in the Last Year: Sometimes true    Ran Out of Food in the Last Year: Sometimes true  Transportation Needs: Unmet Transportation Needs (2/16/Warren)   PRAPARE - Transportation    Lack of Transportation (Medical): Yes    Lack of Transportation (Non-Medical): Yes  Physical Activity: Inactive (07/15/2022)   Exercise Vital Sign    Days of Exercise per Week: 0 days    Minutes of Exercise per Session: 0 min  Stress: Stress Concern Present (07/15/2022)   [place]    Feeling of Stress : Very much  Social Connections: Socially Isolated (07/15/2022)   Social Connection and Isolation Panel [NHANES]    Frequency of Communication with Friends and Family: More than three times a week    Frequency of Social Gatherings with Friends and Family: Once a week    Attends Religious Services: Never    Marine scientist or Organizations: No    Attends English as a second language teacher Meetings: Never    Marital Status: Divorced   Additional Social History:    Allergies:  No Known Allergies  Labs:  Results for orders placed or performed during the hospital encounter of 01/22/23 (from the past 48 hour(s))  Aerobic/Anaerobic Culture w Gram Stain (surgical/deep wound)     Status: None (Preliminary result)   Collection Time: 02/03/23  6:07 PM   Specimen: PATH Other; Body Fluid  Result Value Ref Range   Specimen Description      WOUND L RING FINGER Performed at [facility] [address], [place], [place] [address]    Special Requests      NONE Performed at [facility], [place] [address], [place], [place] [address]    Gram Stain      RARE WBC PRESENT, PREDOMINANTLY PMN FEW GRAM POSITIVE COCCI FEW GRAM NEGATIVE RODS RARE GRAM POSITIVE RODS    Culture      CULTURE REINCUBATED FOR BETTER GROWTH Performed at [facility], [place] [address], [place], [place] [address]    Report Status PENDING   Aerobic/Anaerobic Culture w Gram Stain (surgical/deep wound)     Status: None (Preliminary result)   Collection Time: 02/03/23  6:25 PM   Specimen: PATH Other; Body Fluid  Result Value Ref Range   Specimen Description      WOUND LH Performed at [facility] [address], [place], [place] [address]    Special Requests      NONE Performed at [facility], [place] [address], [place], [place] [address]    Gram Stain      RARE WBC PRESENT, PREDOMINANTLY PMN NO ORGANISMS SEEN    Culture      NO GROWTH 2 DAYS Performed at [facility] [address], [place], [place] [address]    Report Status PENDING   CBC     Status: Abnormal   Collection Time: 02/04/23  4:04 AM  Result Value Ref Range   WBC 6.6 4.0 - 10.5 K/uL   RBC 2.72 (L) 4.22 - 5.81 MIL/uL   Hemoglobin 10.0 (L) 13.0 - 17.0 g/dL   HCT 28.6 (L) 39.0 - 52.0 %  MCV 105.1 (H) 80.0 - 100.0 fL   MCH 36.8 (H)  26.0 - 34.0 pg   MCHC 35.0 30.0 - 36.0 g/dL   RDW 18.2 (H) 11.5 - 15.5 %   Platelets 107 (L) 150 - 400 K/uL   nRBC 0.0 0.0 - 0.2 %    Comment: Performed at [facility] [place] [address] [place], [place] [address]  Comprehensive metabolic panel     Status: Abnormal   Collection Time: 02/04/23  4:04 AM  Result Value Ref Range   Sodium 132 (L) 135 - 145 mmol/L   Potassium 4.2 3.5 - 5.1 mmol/L   Chloride 100 98 - 111 mmol/L   CO2 23 22 - 32 mmol/L   Glucose, Bld 155 (H) 70 - 99 mg/dL    Comment: Glucose reference range applies only to samples taken after fasting for at least 8 hours.   BUN 15 6 - 20 mg/dL   Creatinine, Ser 0.89 0.61 - 1.24 mg/dL   Calcium 8.6 (L) 8.9 - 10.3 mg/dL   Total Protein 6.3 (L) 6.5 - 8.1 g/dL   Albumin 2.1 (L) 3.5 - 5.0 g/dL   AST 59 (H) 15 - 41 U/L   ALT 22 0 - 44 U/L   Alkaline Phosphatase 141 (H) 38 - 126 U/L   Total Bilirubin 6.8 (H) 0.3 - 1.2 mg/dL   GFR, Estimated >60 >60 mL/min    Comment: (NOTE) Calculated using the CKD-EPI Creatinine Equation (2021)    Anion gap 9 5 - 15    Comment: Performed at [facility], [place] [address] [place], [place] [address]    Current Facility-Administered Medications  Medication Dose Route Frequency Provider Last Rate Last Admin   0.9 %  sodium chloride infusion  250 mL Intravenous PRN Doutova, Anastassia, MD       cefTRIAXone (ROCEPHIN) 2 g in sodium chloride 0.9 % 100 mL IVPB  2 g Intravenous Q24H Shelly Coss, MD 200 mL/hr at 02/05/23 0939 2 g at [id number]   folic acid (FOLVITE) tablet 1 mg  1 mg Oral Daily Doutova, Nyoka Lint, MD   1 mg at 02/05/23 [id number]   furosemide (LASIX) tablet 20 mg  20 mg Oral Daily Vance Gather B, MD       morphine (PF) 2 MG/ML injection 2 mg  2 mg Intravenous Q3H PRN Nathaniel Baker, MD   2 mg at 02/02/23 1753   ondansetron (ZOFRAN) injection 4 mg  4 mg Intravenous Q6H PRN Shelly Coss, MD   4 mg at 01/30/23 1038   oxyCODONE-acetaminophen  (PERCOCET/ROXICET) 5-325 MG per tablet 2 tablet  2 tablet Oral Q4H PRN Nita Sells, MD   2 tablet at 02/05/23 [id number]   PARoxetine (PAXIL) tablet 10 mg  10 mg Oral Daily Cinderella, Margaret A   10 mg at 02/04/23 2158   polyethylene glycol (MIRALAX / GLYCOLAX) packet 17 g  17 g Oral Daily Shelly Coss, MD   17 g at 02/05/23 [id number]   QUEtiapine (SEROQUEL) tablet 25 mg  25 mg Oral QHS Cinderella, Margaret A   25 mg at 02/04/23 2158   risperiDONE (RISPERDAL M-TABS) disintegrating tablet 2 mg  2 mg Oral Q8H PRN Suella Broad, FNP       And   ziprasidone (GEODON) injection 20 mg  20 mg Intramuscular PRN Starkes-Perry, Gayland Curry, FNP       sodium chloride flush (NS) 0.9 % injection 3 mL  3 mL Intravenous Q12H Nathaniel Baker, MD  3 mL at 02/04/23 2159   sodium chloride flush (NS) 0.9 % injection 3 mL  3 mL Intravenous PRN Nathaniel Baker, MD       spironolactone (ALDACTONE) tablet 50 mg  50 mg Oral Daily Patrecia Pour, MD       thiamine (VITAMIN B1) tablet 100 mg  100 mg Oral Daily Doutova, Anastassia, MD   100 mg at 02/05/23 [id number]   vancomycin (VANCOCIN) IVPB 1000 mg/200 mL premix  1,000 mg Intravenous Q12H Pham, Anh P, RPH 200 mL/hr at 02/05/23 1113 1,000 mg at 02/05/23 1113    Musculoskeletal: Strength & Muscle Tone: within normal limits Gait & Station: normal Patient leans: N/A  Psychiatric Specialty Exam:  Presentation  General Appearance:  Casual  Eye Contact: Good  Speech: Normal Rate  Speech Volume: Normal  Handedness: Ambidextrous   Mood and Affect  Mood: Dysphoric  Affect: Appropriate; Congruent   Thought Process  Thought Processes: Linear  Descriptions of Associations:Intact  Orientation:Full (Time, Place and Person)  Thought Content:Logical  History of Schizophrenia/Schizoaffective disorder:No  Duration of Psychotic Symptoms:No data recorded Hallucinations:Hallucinations: None  Ideas of Reference:None  Suicidal  Thoughts:Suicidal Thoughts: No  Homicidal Thoughts:Homicidal Thoughts: No   Sensorium  Memory: Immediate Fair; Recent Fair  Judgment: Fair  Insight: Fair   Community education officer  Concentration: Fair  Attention Span: Fair  Recall: [place] of Knowledge: Good  Language: Fair   Psychomotor Activity  Psychomotor Activity: Psychomotor Activity: Normal   Assets  Assets: Communication Skills; Housing; Physical Health; Social Support; Resilience   Sleep  Sleep: Sleep: Good   Physical Exam: Physical Exam Vitals and nursing note reviewed.  Constitutional:      Appearance: He is ill-appearing.  HENT:     Head: Normocephalic.     Nose: Nose normal.     Mouth/Throat:     Mouth: Mucous membranes are dry.  Eyes:     General: Scleral icterus present.     Pupils: Pupils are equal, round, and reactive to light.  Pulmonary:     Effort: Pulmonary effort is normal.  Abdominal:     General: There is distension.  Musculoskeletal:        General: Normal range of motion.     Cervical back: Normal range of motion.  Skin:    Coloration: Skin is jaundiced.     Findings: Bruising present.  Neurological:     Mental Status: He is alert and oriented to person, place, and time.  Psychiatric:        Attention and Perception: Attention and perception normal.        Mood and Affect: Affect is blunt.        Speech: Speech normal.        Behavior: Behavior is withdrawn.        Thought Content: Thought content is not paranoid or delusional. Thought content does not include homicidal or suicidal ideation. Thought content does not include homicidal or suicidal plan.        Cognition and Memory: Cognition and memory normal.    Review of Systems  Psychiatric/Behavioral:  Positive for depression.    Blood pressure 131/65, pulse (!) 55, temperature (!) 97.5 F (36.4 C), temperature source Oral, resp. rate 18, height '5\' 9"'$  (1.753 m), weight 77.7 kg, SpO2 98 %. Body mass index  is 25.3 kg/m.  Treatment Plan Summary: Daily contact with patient to assess and evaluate symptoms and progress in treatment, Medication management, and Plan   Plan:  Assessment: -- likely bipolar II  -GAD -PTSD -Hx of Alcohol use disorder This is a patient with a history of bipolar disorder and multiple lifetime suicide attempts here with end stage cirrhosis and a hand hemotoma. Has made many suicidal statements early in course of hospitalization, and initially refused every medicaiton offered out of a sense of futility due to cost. Over the past week (since about 2/22), he has consented to paxil and quetiapine, and has been sleeping much better with attendant improvements in mood, affect, and motivation to participate in PT. We have addressed numerous risk factors for suicidality (insomnia, untreated depression) while medically admitted; he remains at chronic high risk due to psychiatirc history and terminal diagnosis. At this point, pt no longer meets criteria for IVC in [place], which requires pt be an acute risk to self, and it was rescinded on 2/28. Psychiatry will continue to follow while admitted to provide further brief supportive psychotherapy, medication management (do not anticipate major future changes), and assistance in dc planning.    Notably, will likely require lower doses of psychotropic meds which are mostly metabolized by CYP system. Seems to be responding well to lower doses of medications than previous, no indication to increase.   Plan: --  stop ivc, sitter: effective 02/05/23  -- continue:  paxil 10 mg --  quetiapine 25 mg QHS  - consult SW to talk with patient, he would like some direction/assistance in regards to accessing social services; place 02/04/23 - consider palliative consult  (if agreeable to/qualifies for hospice, may be able to meet many of his needs)    Psychiatry will continue to follow.  Disposition: No evidence of imminent risk to self or others at  present.   Patient does not meet criteria for psychiatric inpatient admission. Supportive therapy provided about ongoing stressors.  Inda Merlin, NP 2/29/Warren 12:41 PM

## 2023-02-06 LAB — BASIC METABOLIC PANEL
Anion gap: 6 (ref 5–15)
BUN: 29 mg/dL — ABNORMAL HIGH (ref 6–20)
CO2: 24 mmol/L (ref 22–32)
Calcium: 8.3 mg/dL — ABNORMAL LOW (ref 8.9–10.3)
Chloride: 101 mmol/L (ref 98–111)
Creatinine, Ser: 1.12 mg/dL (ref 0.61–1.24)
GFR, Estimated: >60 mL/min (ref >60)
Glucose, Bld: 111 mg/dL — ABNORMAL HIGH (ref 70–99)
Potassium: 4.1 mmol/L (ref 3.5–5.1)
Sodium: 131 mmol/L — ABNORMAL LOW (ref 135–145)

## 2023-02-06 LAB — CBC
HCT: 25.1 % — ABNORMAL LOW (ref 39.0–52.0)
Hemoglobin: 8.8 g/dL — ABNORMAL LOW (ref 13.0–17.0)
MCH: 36.8 pg — ABNORMAL HIGH (ref 26.0–34.0)
MCHC: 35.1 g/dL (ref 30.0–36.0)
MCV: 105 fL — ABNORMAL HIGH (ref 80.0–100.0)
Platelets: 112 10*3/uL — ABNORMAL LOW (ref 150–400)
RBC: 2.39 MIL/uL — ABNORMAL LOW (ref 4.22–5.81)
RDW: 19.2 % — ABNORMAL HIGH (ref 11.5–15.5)
WBC: 18.5 10*3/uL — ABNORMAL HIGH (ref 4.0–10.5)
nRBC: 0.2 % (ref 0.0–0.2)

## 2023-02-06 MED ORDER — FUROSEMIDE 40 MG PO TABS
20.0000 mg | ORAL_TABLET | Freq: Every day | ORAL | Status: DC
  Administered 2023-02-06 – 2023-02-10 (×5): 20 mg via ORAL
  Filled 2023-02-06 (×5): qty 1

## 2023-02-06 MED ORDER — HYDROCERIN EX CREA
TOPICAL_CREAM | Freq: Two times a day (BID) | CUTANEOUS | Status: DC
  Administered 2023-02-08 – 2023-03-01 (×3): 1 via TOPICAL
  Filled 2023-02-06: qty 113

## 2023-02-06 MED ORDER — SPIRONOLACTONE 25 MG PO TABS
50.0000 mg | ORAL_TABLET | Freq: Every day | ORAL | Status: DC
  Administered 2023-02-06 – 2023-02-14 (×9): 50 mg via ORAL
  Filled 2023-02-06 (×9): qty 2

## 2023-02-06 MED ORDER — CEFADROXIL 500 MG PO CAPS
1000.0000 mg | ORAL_CAPSULE | Freq: Two times a day (BID) | ORAL | Status: DC
  Administered 2023-02-06 – 2023-02-07 (×2): 1000 mg via ORAL
  Filled 2023-02-06 (×3): qty 2

## 2023-02-06 MED ORDER — CEFADROXIL 500 MG PO CAPS: 500.0000 mg | ORAL_CAPSULE | Freq: Two times a day (BID) | ORAL | Status: DC

## 2023-02-06 NOTE — Plan of Care (Signed)
  Problem: Health Behavior/Discharge Planning: Goal: Ability to manage health-related needs will improve Outcome: Progressing   Problem: Clinical Measurements: Goal: Ability to maintain clinical measurements within normal limits will improve Outcome: Progressing   

## 2023-02-06 NOTE — Progress Notes (Signed)
Physical Therapy Treatment Patient Details Name: Nathaniel Warren MRN: [id number] DOB: 24-Aug-1966 Today's Date: 02/06/2023   History of Present Illness 57 yo male with onset of groin and RT dorsal hand bruising and edema with reported light trauma during a fall.  Has h/o R foot drop without AFO. + for suicidal ideations.   PMHx:  etoh abuse, depression with h/o 4 suicide attempts, stroke, seizures, liver failure, R ankle fracture with ORIF, atherosclerosis, cirrhosis, diverticulosis, osteopenia    PT Comments    Pt assisted with ambulation however distance limited due to right hand pain (from weight bearing on RW).  Pt had left platform attachment to decrease weight bearing on left hand.  Pt also has hx of right foot drop with no AFO and also observed increased supination with stance.   Pt would benefit from f/u therapy upon d/c.  Would recommend HHPT if pt cannot transport or pending insurance/payment, but OPPT especially for hand therapy would be most beneficial.   Recommendations for follow up therapy are one component of a multi-disciplinary discharge planning process, led by the attending physician.  Recommendations may be updated based on patient status, additional functional criteria and insurance authorization.  Follow Up Recommendations  Home health PT     Assistance Recommended at Discharge Intermittent Supervision/Assistance  Patient can return home with the following A little help with walking and/or transfers;Direct supervision/assist for medications management;Assistance with cooking/housework;Assist for transportation;A little help with bathing/dressing/bathroom;Help with stairs or ramp for entrance   Equipment Recommendations  Other (comment) (left platform for RW)    Recommendations for Other Services       Precautions / Restrictions Precautions Precautions: Fall Precaution Comments: Suicide precautions Required Braces or Orthoses: Splint/Cast Splint/Cast: LT  D4 Splint/Cast - Date Prophylactic Dressing Applied (if applicable):  (Applied by MD) Restrictions Weight Bearing Restrictions: No Other Position/Activity Restrictions: Limited LT hand WB since I&D to D4     Mobility  Bed Mobility Overal bed mobility: Modified Independent             General bed mobility comments: HOB elevated    Transfers Overall transfer level: Needs assistance Equipment used: Left platform walker Transfers: Sit to/from Stand Sit to Stand: Min guard, From elevated surface           General transfer comment: Close Min guard for safety. Increased time. Cues provided for safety.    Ambulation/Gait Ambulation/Gait assistance: Min guard Gait Distance (Feet): 120 Feet Assistive device: Left platform walker Gait Pattern/deviations: Step-through pattern, Steppage, Narrow base of support Gait velocity: decreased     General Gait Details: MIn guard for safety. No LOB with RW use. pt ambulating in socks and would prefer shoes next time as he states shoe supports right foot (which has hx of foot drop and also increased supination during stance)   Stairs             Wheelchair Mobility    Modified Rankin (Stroke Patients Only)       Balance Overall balance assessment: History of Falls, Needs assistance         Standing balance support: During functional activity, Reliant on assistive device for balance Standing balance-Leahy Scale: Poor Standing balance comment: heavily reliant on walker                            Cognition Arousal/Alertness: Awake/alert Behavior During Therapy: WFL for tasks assessed/performed Overall Cognitive Status: Within Functional Limits for tasks assessed  Exercises      General Comments        Pertinent Vitals/Pain Pain Assessment Pain Assessment: Faces Faces Pain Scale: Hurts little more Pain Location: left hand Pain Descriptors /  Indicators: Grimacing Pain Intervention(s): Repositioned, Monitored during session    Home Living                          Prior Function            PT Goals (current goals can now be found in the care plan section) Acute Rehab PT Goals PT Goal Formulation: With patient Time For Goal Achievement: 02/20/23 Potential to Achieve Goals: Good Progress towards PT goals: Progressing toward goals    Frequency    Min 3X/week      PT Plan Current plan remains appropriate    Co-evaluation              AM-PAC PT "6 Clicks" Mobility   Outcome Measure  Help needed turning from your back to your side while in a flat bed without using bedrails?: None Help needed moving from lying on your back to sitting on the side of a flat bed without using bedrails?: None Help needed moving to and from a bed to a chair (including a wheelchair)?: A Little Help needed standing up from a chair using your arms (e.g., wheelchair or bedside chair)?: A Little Help needed to walk in hospital room?: A Little Help needed climbing 3-5 steps with a railing? : A Lot 6 Click Score: 19    End of Session Equipment Utilized During Treatment: Gait belt Activity Tolerance: Patient tolerated treatment well Patient left: in chair;with call bell/phone within reach;with chair alarm set   PT Visit Diagnosis: Muscle weakness (generalized) (M62.81);Other abnormalities of gait and mobility (R26.89);History of falling (Z91.81)     Time: [id number] PT Time Calculation (min) (ACUTE ONLY): 21 min  Charges:  $Gait Training: 8-22 mins                    Jannette Spanner PT, DPT Physical Therapist Acute Rehabilitation Services Preferred contact method: Secure Chat Weekend Pager Only: [phone] Office: [place] 02/06/2023, 4:09 PM

## 2023-02-06 NOTE — Progress Notes (Signed)
Occupational Therapy Treatment Patient Details Name: Nathaniel Warren MRN: [id number] DOB: 09-27-1966 Today's Date: 02/06/2023   History of present illness 57 yo male with onset of grion and RT dorsal hand bruising and edema with reported light trauma during a fall.  Has h/o R foot drop without AFO. + for suicidal ideations.   PMHx:  etoh abuse, depression with h/o 4 suicide attempts, stroke, seizures, liver failure, R ankle fracture with ORIF, atherosclerosis, cirrhosis, diverticulosis, osteopenia   OT comments  Patient progressing and goals have been updated with new POC to [id number] due to new complication of LT D4 I&D, now splinted and pt stating that he can tolerate no LT hand or wrist ROM.  Pt showed improved RT hand function now with D3 able to flex and touch the distal palmer crease after exercises, compared to previous session when pt was 3.7 cm away from [facility].  Patient remains limited by now bilateral  hand limitations and pain, generalized weakness and decreased activity tolerance along with deficits noted below. Pt continues to demonstrate good rehab potential and would benefit from continued skilled OT to increase safety and independence with ADLs and functional transfers to allow pt to return home safely and reduce caregiver burden and fall risk.    Recommendations for follow up therapy are one component of a multi-disciplinary discharge planning process, led by the attending physician.  Recommendations may be updated based on patient status, additional functional criteria and insurance authorization.    Follow Up Recommendations  Skilled nursing-short term rehab (<3 hours/day)     Assistance Recommended at Discharge Frequent or constant Supervision/Assistance  Patient can return home with the following  A little help with bathing/dressing/bathroom;A little help with walking and/or transfers;Assistance with cooking/housework;Assist for transportation;Direct supervision/assist for medications  management   Equipment Recommendations  None recommended by OT    Recommendations for Other Services      Precautions / Restrictions Precautions Precautions: Fall Precaution Comments: Suicide precautions Required Braces or Orthoses: Splint/Cast Splint/Cast: LT D4 Splint/Cast - Date Prophylactic Dressing Applied (if applicable):  (Applied by MD) Restrictions Weight Bearing Restrictions: No Other Position/Activity Restrictions: Limited LT hand WB since I&D to D4       Mobility Bed Mobility                    Transfers                         Balance                                           ADL either performed or assessed with clinical judgement   ADL     Eating/Feeding Details (indicate cue type and reason): Pt reports that he is now feeding himself with his RT hand.                                        Extremity/Trunk Assessment Upper Extremity Assessment RUE Coordination: decreased fine motor LUE Deficits / Details: s/p I & D to ring finger with bulky dressing and D4 splint, other fingers and dorsum moderately edematous.  Pt instructed to move LT shoulder and elbow ad lib to avoid stifffness to joints. Pt demosntrated understanding. LUE Coordination: decreased fine motor  Vision Patient Visual Report: No change from baseline Vision Assessment?: No apparent visual deficits   Perception     Praxis      Cognition Arousal/Alertness: Awake/alert   Overall Cognitive Status: Within Functional Limits for tasks assessed                                          Exercises Other Exercises Other Exercises: Pt able to recall 3/3 eercises and demonstrated each x10.  Gentle traction to each digit prior to exercises. Pt able to bring RT D3 to [facility] at this time after warm-up and exercises.  Pt able to demo gentle RT hand strengthening with squeeze ball. Other Exercises: Manual techniques  to each finger to reduce edema and then passive ROM to digits per patient. Other Exercises: Massage techniques to palm and retrograde massage to digits of RT.    Shoulder Instructions       General Comments      Pertinent Vitals/ Pain       Pain Assessment Pain Assessment: Faces Faces Pain Scale: Hurts little more Pain Location: Ocassional facial grimace with retrograde massage. esp to dorsal hand. Pain Descriptors / Indicators: Grimacing Pain Intervention(s): Limited activity within patient's tolerance, Repositioned (Elevated BUEs on 2 pillows.)  Home Living                                          Prior Functioning/Environment              Frequency  Min 2X/week        Progress Toward Goals  OT Goals(current goals can now be found in the care plan section)  Progress towards OT goals: Progressing toward goals (1 OT goal met. Pt progressing towards all other goals.)  Acute Rehab OT Goals Patient Stated Goal: use RT hand more OT Goal Formulation: With patient Time For Goal Achievement: 02/20/23 Potential to Achieve Goals: Good  Plan Discharge plan needs to be updated    Co-evaluation                 AM-PAC OT "6 Clicks" Daily Activity     Outcome Measure   Help from another person eating meals?: A Little Help from another person taking care of personal grooming?: A Little Help from another person toileting, which includes using toliet, bedpan, or urinal?: A Little Help from another person bathing (including washing, rinsing, drying)?: A Little Help from another person to put on and taking off regular upper body clothing?: A Little Help from another person to put on and taking off regular lower body clothing?: A Little 6 Click Score: 18    End of Session    OT Visit Diagnosis: Pain;History of falling (Z91.81);Repeated falls (R29.6);Muscle weakness (generalized) (M62.81);Unsteadiness on feet (R26.81) Pain - Right/Left: Left  (Bil) Pain - part of body: Hip   Activity Tolerance Patient tolerated treatment well   Patient Left in bed;with call bell/phone within reach;with nursing/sitter in room   Nurse Communication          Time: [id number] OT Time Calculation (min): 26 min  Charges: OT General Charges $OT Visit: 1 Visit OT Treatments $Therapeutic Exercise: 8-22 mins $Massage: 8-22 mins  Anderson Malta, [place] Office: [phone] 02/06/2023   Julien Girt 02/06/2023, 12:32 PM

## 2023-02-06 NOTE — Progress Notes (Signed)
[facility] PROGRESS NOTE  Nathaniel Warren (DOB: 10/31/1966) [id number] PCP: [name], NP  Brief Narrative: Patient is a 57 year old male with history of alcohol abuse/cirrhosis of liver, depression, chronic right ankle pain, suicidal attempts presented to the emergency department with complaint of left groin swelling, right arm swelling.  Found to have hematoma of the left groin as well as swelling of the right arm consistent with hematoma.  CT hand showed diffuse swelling without any abscess.  Due to suicidal ideation, psychiatry was consulted.  Hand surgery also consulted.  Swelling  improved.  Hematoma remains stable.  Given few days of vitamin K given this hospitalization for elevated INR.  PT/OT recommending acute inpatient rehab/SNF but looks difficult due to his psychiatric history.  Psychiatry recommending inpatient psychiatric admission.  TOC following.  Hospital course remarkable for persistent pain and swelling of the right hand,finding of necrotic left 4th finger tip.  Hand surgery following, performed left ring finger I&D, nail plate removal, nailbed repair, soft tissue rearrangement <10sqcm for coverage of bone and left dorsal hand abscess I&D on 2/27.  Subjective: No fevers, feeling slow steady improvement but still pain and unable to really use the left hand (postop) nor the right due to dorsum swelling which is improving slowly as well.   Objective: BP (!) 125/59 (BP Location: Left Leg)   Pulse (!) 57   Temp 98 F (36.7 C) (Oral)   Resp 20   Ht '5\' 9"'$  (1.753 m)   Wt 77.7 kg   SpO2 96%   BMI 25.30 kg/m   Gen: No distress Pulm: Clear, nonlabored  CV: RRR, no MRG. 1+ pitting dependent LE edema GI: Soft, NT, ND, +BS Neuro: Alert and oriented. No new focal deficits. Ext: Warm, no deformities Skin: Jaundice stable. RUE dense ecchymosis is stable, L 4th digit and dorsum of the hand dressings c/d/I, no spreading erythema. No new rashes, lesions or ulcers on  visualized skin   Assessment & Plan: Principal Problem:   Spontaneous hematoma of lower leg Active Problems:   Alcoholic cirrhosis of liver with ascites (HCC)   MDD (major depressive disorder), recurrent severe, without psychosis ([place])   Hyponatremia   Tobacco abuse   Suicidal ideations  Right arm/left groin swelling consistent with hematoma/necrotic left 4th finger tip:  - s/p vitamin K, FFP 2 units - Continue elevation, ice application, pain medication. - Monitor blood cultures (group C strep in 1 of 2 (anaerobic bottle) from 2/24).  - He was also found to have worsening of the ulcer on the left fourth fingertip, necrotic, foul-smelling. MRI of the finger does not show any abscess or osteomyelitis, s/p I&D with cultures on 2/27 by Dr. Greta Doom. Cultures reincubated, no specific organism yet identified. Leukocytosis resolved on vancomycin/ceftriaxone started 2/24. Recurrent 3/1, will monitor again in AM as pt continues clinical improvement and afebrile. Will tailor abx to wound Cx results, deescalate to cefadroxil. (+gram stain for GPC, GNR, GPR) - Continue OT, assistance.  Suicide attempt/PTSD:  - Continue paroxetine and quetiapine which have improved symptoms.  - No longer requires IVC or inpatient psychiatric hospitalization per psychiatry. IVC rescinded. Sitter discontinued. Pt appears stable.   Chronic pain syndrome: Has history of L5-S1 nerve compression with sciatica, chronic knee pain with foot drop.  Continue pain management, supportive care.  - He may need to increase his mobility while inpatient before being discharged. He has no way of paying for SNF level rehabilitation and will likely be discharged home.   Spermatocele:  -  Recommend ultrasound in 3 months   History of alcohol abuse/liver cirrhosis: Quit in 2023.  Elevated INR.  Given few days of vitamin K.  Has mild elevated liver enzymes along with bilirubin which is stable. - Recommend to follow-up with GI/hepatology as  an outpatient. Follows with [facility]. - Restarted lasix/spiro at 20/50 doses, Cr up a bit but still swollen, will continue this and repeat labs in AM. Monitor VS's and BMP.   Macrocytic anemia/thrombocytopenia: This is secondary to chronic alcohol abuse, cirrhosis. continue thiamine /folic acid.  Hemoglobin stable.  Normal folate, vitamin B12 high - Hgb stable postoperatively, plt also stable, both low. Monitor again in AM.   Nausea, vomiting: Had nausea with vomiting on 2/21.  Resolved after having a big bowel movement    Patrecia Pour, MD [facility] [email or web address] 02/06/2023, 11:09 AM

## 2023-02-07 LAB — CBC
HCT: 25.3 % — ABNORMAL LOW (ref 39.0–52.0)
Hemoglobin: 8.7 g/dL — ABNORMAL LOW (ref 13.0–17.0)
MCH: 36.3 pg — ABNORMAL HIGH (ref 26.0–34.0)
MCHC: 34.4 g/dL (ref 30.0–36.0)
MCV: 105.4 fL — ABNORMAL HIGH (ref 80.0–100.0)
Platelets: 109 10*3/uL — ABNORMAL LOW (ref 150–400)
RBC: 2.4 MIL/uL — ABNORMAL LOW (ref 4.22–5.81)
RDW: 19.2 % — ABNORMAL HIGH (ref 11.5–15.5)
WBC: 17 10*3/uL — ABNORMAL HIGH (ref 4.0–10.5)
nRBC: 0.4 % — ABNORMAL HIGH (ref 0.0–0.2)

## 2023-02-07 LAB — AEROBIC/ANAEROBIC CULTURE W GRAM STAIN (SURGICAL/DEEP WOUND)

## 2023-02-07 LAB — BASIC METABOLIC PANEL
Anion gap: 8 (ref 5–15)
BUN: 33 mg/dL — ABNORMAL HIGH (ref 6–20)
CO2: 26 mmol/L (ref 22–32)
Calcium: 8.5 mg/dL — ABNORMAL LOW (ref 8.9–10.3)
Chloride: 102 mmol/L (ref 98–111)
Creatinine, Ser: 1.24 mg/dL (ref 0.61–1.24)
GFR, Estimated: >60 mL/min (ref >60)
Glucose, Bld: 99 mg/dL (ref 70–99)
Potassium: 4.2 mmol/L (ref 3.5–5.1)
Sodium: 136 mmol/L (ref 135–145)

## 2023-02-07 MED ORDER — ORAL CARE MOUTH RINSE: 15.0000 mL | OROMUCOSAL | Status: DC | PRN

## 2023-02-07 MED ORDER — AMOXICILLIN-POT CLAVULANATE 875-125 MG PO TABS
1.0000 | ORAL_TABLET | Freq: Two times a day (BID) | ORAL | Status: DC
  Administered 2023-02-07 – 2023-02-08 (×2): 1 via ORAL
  Filled 2023-02-07 (×2): qty 1

## 2023-02-07 MED ORDER — PHYTONADIONE 5 MG PO TABS
5.0000 mg | ORAL_TABLET | Freq: Every day | ORAL | Status: AC
  Administered 2023-02-07 – 2023-02-09 (×3): 5 mg via ORAL
  Filled 2023-02-07 (×3): qty 1

## 2023-02-07 NOTE — Progress Notes (Signed)
[facility] PROGRESS NOTE  Nathaniel Warren (DOB: [birth year]) [id number] PCP: [name], NP  Brief Narrative: Patient is a 57 year old male with history of alcohol abuse/cirrhosis of liver, depression, chronic right ankle pain, suicidal attempts presented to the emergency department with complaint of left groin swelling, right arm swelling.  Found to have hematoma of the left groin as well as swelling of the right arm consistent with hematoma.  CT hand showed diffuse swelling without any abscess.  Due to suicidal ideation, psychiatry was consulted.  Hand surgery also consulted.  Swelling  improved.  Hematoma remains stable.  Given few days of vitamin K given this hospitalization for elevated INR.  PT/OT recommending acute inpatient rehab/SNF but looks difficult due to his psychiatric history.  Psychiatry recommending inpatient psychiatric admission.  TOC following.  Hospital course remarkable for persistent pain and swelling of the right hand,finding of necrotic left 4th finger tip.  Hand surgery following, performed left ring finger I&D, nail plate removal, nailbed repair, soft tissue rearrangement <10sqcm for coverage of bone and left dorsal hand abscess I&D on 2/27.  Subjective: Wished him a happy birthday. Left hand wound redressed several times over past 24 hours. Pain is stable. Not happy with nursing care overnight.   Objective: BP (!) 112/57 (BP Location: Left Leg)   Pulse 64   Temp 97.7 F (36.5 C)   Resp 16   Ht '5\' 9"'$  (1.753 m)   Wt 77.7 kg   SpO2 97%   BMI 25.30 kg/m   Gen: Chronically ill-appearing, jaundiced male in no distress Pulm: Clear, nonlabored  CV: RRR, no MRG or edema GI: Soft, NT, ND, +BS  Neuro: Alert and oriented. No new focal deficits. Ext: Warm, no deformities Skin: Left hand dorsum with oozing bleeding from incision site, some extruding clot. Left 4th digit dressing c/d/I. L forearm foam dressing taken down and no open wound noted. RUE still  diffusely ecchymotic. No new rashes, lesions or ulcers on visualized skin   Assessment & Plan: Right arm/left groin swelling consistent with hematoma/necrotic left 4th finger tip:  - s/p vitamin K, FFP 2 units. Having some more bleeding of left surgical site. Give vitamin K x3 days, monitor H/H. Elevate extremity and apply pressure dressing.  - Continue elevation, ice application, pain medication. - Monitor blood cultures (group C strep in 1 of 2 (anaerobic bottle) from 2/24).  - He was also found to have worsening of the ulcer on the left fourth fingertip, necrotic, foul-smelling. No abscess or osteomyelitis by MRI, s/p I&D with cultures on 2/27 by Dr. Greta Doom. Cultures reincubated, now showing Corynebacterium and Prevotella (+beta lactamase). Suspect inadequate coverage was allowing refractory leukocytosis. Will tailor abx, d/w ID.    - Continue OT, assistance. He has good rehabilitation potential but no way of pursuing rehabilitation outside of this facility (uninsured, no permanent home), so will continue therapy as much as possible to facilitate safe discharge.  Suicide attempt/PTSD:  - Continue paroxetine and quetiapine which have improved symptoms.  - No longer requires IVC or inpatient psychiatric hospitalization per psychiatry. IVC rescinded. Sitter discontinued. Pt appears stable.   Chronic pain syndrome: Has history of L5-S1 nerve compression with sciatica, chronic knee pain with foot drop.  Continue pain management, supportive care.  - He may need to increase his mobility while inpatient before being discharged. He has no way of paying for SNF level rehabilitation and will likely be discharged home.   Spermatocele:  - Recommend ultrasound in 3 months  History of alcohol abuse/liver cirrhosis: Quit in 2023.  Elevated INR.  Given few days of vitamin K.  Has mild elevated liver enzymes along with bilirubin which is stable. - Recommend to follow-up with GI/hepatology as an outpatient.  Follows with [facility]. - Restarted lasix/spiro at 20/50 doses, Cr up a bit but still swollen, will continue this and repeat labs in AM. Monitor VS's and BMP.   Macrocytic anemia/thrombocytopenia: This is secondary to chronic alcohol abuse, cirrhosis. continue thiamine /folic acid.  Hemoglobin stable.  Normal folate, vitamin B12 high - Hgb relatively stable.    Nausea, vomiting: Had nausea with vomiting on 2/21.  Resolved after having a big bowel movement    Patrecia Pour, MD [facility] [email or web address] 02/07/2023, 4:44 PM

## 2023-02-08 LAB — BASIC METABOLIC PANEL
Anion gap: 6 (ref 5–15)
BUN: 31 mg/dL — ABNORMAL HIGH (ref 6–20)
CO2: 26 mmol/L (ref 22–32)
Calcium: 8.7 mg/dL — ABNORMAL LOW (ref 8.9–10.3)
Chloride: 104 mmol/L (ref 98–111)
Creatinine, Ser: 1.23 mg/dL (ref 0.61–1.24)
GFR, Estimated: >60 mL/min (ref >60)
Glucose, Bld: 114 mg/dL — ABNORMAL HIGH (ref 70–99)
Potassium: 4.2 mmol/L (ref 3.5–5.1)
Sodium: 136 mmol/L (ref 135–145)

## 2023-02-08 LAB — PROTIME-INR
INR: 2.1 — ABNORMAL HIGH (ref 0.8–1.2)
Prothrombin Time: 23.5 seconds — ABNORMAL HIGH (ref 11.4–15.2)

## 2023-02-08 LAB — CBC
HCT: 26.5 % — ABNORMAL LOW (ref 39.0–52.0)
HCT: 25.9 % — ABNORMAL LOW (ref 39.0–52.0)
Hemoglobin: 9 g/dL — ABNORMAL LOW (ref 13.0–17.0)
Hemoglobin: 8.9 g/dL — ABNORMAL LOW (ref 13.0–17.0)
MCH: 36.3 pg — ABNORMAL HIGH (ref 26.0–34.0)
MCH: 36.6 pg — ABNORMAL HIGH (ref 26.0–34.0)
MCHC: 34 g/dL (ref 30.0–36.0)
MCHC: 34.4 g/dL (ref 30.0–36.0)
MCV: 106.9 fL — ABNORMAL HIGH (ref 80.0–100.0)
MCV: 106.6 fL — ABNORMAL HIGH (ref 80.0–100.0)
Platelets: 117 10*3/uL — ABNORMAL LOW (ref 150–400)
Platelets: 118 10*3/uL — ABNORMAL LOW (ref 150–400)
RBC: 2.48 MIL/uL — ABNORMAL LOW (ref 4.22–5.81)
RBC: 2.43 MIL/uL — ABNORMAL LOW (ref 4.22–5.81)
RDW: 19.6 % — ABNORMAL HIGH (ref 11.5–15.5)
RDW: 19.2 % — ABNORMAL HIGH (ref 11.5–15.5)
WBC: 13.2 10*3/uL — ABNORMAL HIGH (ref 4.0–10.5)
WBC: 14.6 10*3/uL — ABNORMAL HIGH (ref 4.0–10.5)
nRBC: 0.5 % — ABNORMAL HIGH (ref 0.0–0.2)
nRBC: 0.5 % — ABNORMAL HIGH (ref 0.0–0.2)

## 2023-02-08 LAB — SUSCEPTIBILITY, AER + ANAEROB

## 2023-02-08 LAB — TYPE AND SCREEN
ABO/RH(D): O POS
Antibody Screen: NEGATIVE

## 2023-02-08 LAB — SUSCEPTIBILITY RESULT

## 2023-02-08 MED ORDER — SODIUM CHLORIDE 0.9% IV SOLUTION: Freq: Once | INTRAVENOUS | Status: AC

## 2023-02-08 MED ORDER — ORITAVANCIN DIPHOSPHATE 400 MG IV SOLR
1200.0000 mg | Freq: Once | INTRAVENOUS | Status: AC
  Administered 2023-02-08: 1200 mg via INTRAVENOUS
  Filled 2023-02-08: qty 120

## 2023-02-08 MED ORDER — METRONIDAZOLE 500 MG PO TABS
500.0000 mg | ORAL_TABLET | Freq: Two times a day (BID) | ORAL | Status: AC
  Administered 2023-02-08 – 2023-02-14 (×14): 500 mg via ORAL
  Filled 2023-02-08 (×14): qty 1

## 2023-02-08 NOTE — Progress Notes (Signed)
[facility] PROGRESS NOTE  Nathaniel Warren (DOB: 1966/03/31) [id number] PCP: [name], NP  Brief Narrative: Patient is a 57 year old male with history of alcohol abuse/cirrhosis of liver, depression, chronic right ankle pain, suicidal attempts presented to the emergency department with complaint of left groin swelling, right arm swelling.  Found to have hematoma of the left groin as well as swelling of the right arm consistent with hematoma.  CT hand showed diffuse swelling without any abscess.  Due to suicidal ideation, psychiatry was consulted.  Hand surgery also consulted.  Swelling  improved.  Hematoma remains stable.  Given few days of vitamin K given this hospitalization for elevated INR.  PT/OT recommending acute inpatient rehab/SNF but looks difficult due to his psychiatric history.  Psychiatry recommending inpatient psychiatric admission.  TOC following.  Hospital course remarkable for persistent pain and swelling of the right hand,finding of necrotic left 4th finger tip.  Hand surgery following, performed left ring finger I&D, nail plate removal, nailbed repair, soft tissue rearrangement <10sqcm for coverage of bone and left dorsal hand abscess I&D on 2/27.  Subjective: Feels his arm/hand are feeling and looking better. Bleeding from left hand is declining but still some ongoing. No fevers or chills or other issues.   Objective: BP 114/65 (BP Location: Left Leg)   Pulse (!) 56   Temp 98.3 F (36.8 C) (Oral)   Resp 16   Ht '5\' 9"'$  (1.753 m)   Wt 77.7 kg   SpO2 97%   BMI 25.30 kg/m   Gen: No distress, chronically ill-appearing  Pulm: Clear and nonlabored  CV: RRR, 1+ pitting dependent edema GI: Soft, NT, ND, +BS, no fluid wave Neuro: Alert and oriented. No new focal deficits. Ext: Warm, no deformities. Skin: Edema and ecchymosis in upper extremities is slowly declining. Nothing new.  Assessment & Plan: Right arm/left groin swelling consistent with  hematoma/necrotic left 4th finger tip:  - s/p vitamin K, FFP 2 units. Having some more bleeding of left surgical site. Giving vitamin K x3 days (3/2 - 3/4), Hgb stable at 9g/dl this AM. Elevate extremity and apply pressure dressing.  - Continue elevation, ice application, pain medication. - Blood cultures from 2/24 suspected contaminant group C strep in 1 of 2 (anaerobic bottle) - He was also found to have worsening of the ulcer on the left fourth fingertip, necrotic, foul-smelling. No abscess or osteomyelitis by MRI, s/p I&D with cultures on 2/27 by Dr. Greta Doom showed Corynebacterium and Prevotella (+beta lactamase). Suspect inadequate coverage was allowing refractory leukocytosis. Started on augmentin, WBC down today.  - Hand surgery following intermittently. - Continue OT, assistance. He has good rehabilitation potential but no way of pursuing rehabilitation outside of this facility (uninsured, no permanent home), so will continue therapy as much as possible to facilitate safe discharge.  Suicide attempt/PTSD:  - Continue paroxetine and quetiapine which have improved symptoms.  - No longer requires IVC or inpatient psychiatric hospitalization per psychiatry. IVC rescinded. Sitter discontinued. Pt appears stable.   Chronic pain syndrome: Has history of L5-S1 nerve compression with sciatica, chronic knee pain with foot drop.  Continue pain management, supportive care.  - He may need to increase his mobility while inpatient before being discharged. He has no way of paying for SNF level rehabilitation and will likely be discharged home.   Spermatocele:  - Recommend ultrasound in 3 months   Alcohol-related hepatic cirrhosis: Quit EtOH in 2023.  Elevated INR, LFTs and bilirubin which is stable. - Recommend to follow-up  with GI/hepatology as an outpatient. Follows with [facility]. - Restarted lasix/spiro at 20/50 doses, Cr up a bit but stable, still w/edema, no ascites. Will continue this and  monitor.   Macrocytic anemia/thrombocytopenia: This is secondary to chronic alcohol abuse, cirrhosis. continue thiamine /folic acid.  Hemoglobin stable.  Normal folate, vitamin B12 high - Hgb relatively stable.    Nausea, vomiting: Had nausea with vomiting on 2/21.  Resolved after having a big bowel movement    Patrecia Pour, MD [facility] [email or web address] 02/08/2023, 8:45 AM

## 2023-02-09 LAB — BPAM FFP
Blood Product Expiration Date: 202403082359
ISSUE DATE / TIME: 202403031657
Unit Type and Rh: 5100

## 2023-02-09 LAB — AEROBIC/ANAEROBIC CULTURE W GRAM STAIN (SURGICAL/DEEP WOUND): Culture: NO GROWTH

## 2023-02-09 LAB — ACID FAST SMEAR (AFB, MYCOBACTERIA)
Acid Fast Smear: NEGATIVE
Acid Fast Smear: NEGATIVE

## 2023-02-09 LAB — LIPID PANEL
Cholesterol: 186 mg/dL (ref 0–200)
HDL: 18 mg/dL — ABNORMAL LOW (ref >40)
LDL Cholesterol: 155 mg/dL — ABNORMAL HIGH (ref 0–99)
Total CHOL/HDL Ratio: 10.3 RATIO
Triglycerides: 67 mg/dL (ref <150)
VLDL: 13 mg/dL (ref 0–40)

## 2023-02-09 LAB — PREPARE FRESH FROZEN PLASMA: Unit division: 0

## 2023-02-09 NOTE — Consult Note (Signed)
[facility] Face-to-Face Psychiatry Consult    Patient Identification: Nathaniel Warren MRN:  [id number] Principal Diagnosis: Spontaneous hematoma of lower leg Diagnosis:  Principal Problem:   Spontaneous hematoma of lower leg Active Problems:   MDD (major depressive disorder), recurrent severe, without psychosis ([place])   Hyponatremia   Tobacco abuse   Alcoholic cirrhosis of liver with ascites ([place])   Suicidal ideations   Total Time spent with patient: 20 minutes  Subjective:   Nathaniel Warren is a 57 year old male with right hand injury, thigh hematoma.  he has a psychiatric h/o bipolar d/o vs mdd, gad, ptsd, alcohol use disorder.   Initial:  Nathaniel Warren is a 57 y.o. male patient admitted with .hx for polysubstance abuse, ETOH use, nerve compression L5-S1, MDD, suicide attempt x 4-who endorses suicidal ideations, and intent.  He is guarded and does not disclose the plan.  He is triggered by his chronic nerve pain, financial losses and medical comorbidities. Patient is disgruntled and frustrated throughout the majority of the evaluation. He ruminates and perseverates about multiple psychosocial stressors with a large contribution being finances, lack of access, and underlying mood disorder.  Patient with feelings of hopelessness and despair, impaired judgement and lack of insight presents an acutely high safety risk and continues to be appropriate for inpatient psychiatric admission where he can be monitored for safety and stabilized on medications. He meets criteria for IVC which will be upheld at this time.    He was alert, oriented, and cooperative with interview. Discussed again medical setbacks since last evaluation, pt seems to be taking well (still using humor as mature defense mechanism). Had birthday celebration over the weekend which helped to raise spirits. Continues to be future oriented throughout exam - very happy medicaid application started while here, overall very in tune with  current medical plan. Sleep is a little less artificial.  No untoward s/e on low dose quetiapine.  Noted to have ongoing broadening of affect, smiling and laughing at times despite setback. Aware of need for PT and proudly stated he had participated . No SI, HI, AH/VH - would reach out to either nursing or Nathaniel Warren if this changed.     Spoke to friend Nathaniel Warren w/ pt consent 2/23 He was aware that Nathaniel Warren was being seen by psychiatry. Caretaker is 15something years old, has been taking care of him for a year and a half. He has been generally laying at home, sick, refusing to take care of himself. He pays for most of pt's medications and needs out of his fixed income. He shares similar concerns about inability to get SSD. He does share that he was unable to pay for pt's mental health medications. He has mentioned suicide to Nathaniel Warren several times in the context of feeling like a burden and being better off dead. He has just now opened up to [name] about how he has been feeling. He had to force Nathaniel Warren to come to the hospital to start with, he is aware that probably doesn't have long to live regardless of medical intervention. He hasn't seen much of an improvement in pt's mental health, thinks he is putting on a front for mental health workers. He does not want Nathaniel Warren to leave the hospital until he is 100% stable because of how hard it was to get him to go to the hospital - had to threaten him with eviction. Friend asks about hospice. Nathaniel Warren thinks a psych hospital would be helpful  but understands Nathaniel Warren is  not medically stable to think about that. Nathaniel Warren notes that Nathaniel Warren does take his medication and responds well when he can afford it. Has had at least 3 falls in the house.   2/28  Called and discussed rescinding IVC for inpt psych. Nathaniel Warren has ongoing concerns about pt's likelyhood of decompensation if his basic needs (food, healthcare) are not met, and some concerns that pt is putting on a good front, but all of these  are more chronic than acute. He has not endorsed suicidal ideations in last several days. Expresses general dissatisfaction with care (no one was called when pt rushed into surgery, pt has not seen a SW); validated these feelings as appropriate.    Past Psychiatric History: bipolar d/o vs mdd, gad, ptsd, alcohol use disorder.   Risk to Self:  yes  Risk to Others:  impulsive but denying HI  Prior Inpatient Therapy:   Prior Outpatient Therapy:    Past Medical History:  Past Medical History:  Diagnosis Date   Depression    Hernia, abdominal    Liver failure ([place])    Seizures ([place])    Stroke [facility])     Past Surgical History:  Procedure Laterality Date   ANKLE SURGERY     ANKLE SURGERY Right    HERNIA REPAIR     INCISION AND DRAINAGE OF WOUND Left 02/03/2023   Procedure: IRRIGATION AND DEBRIDEMENT WOUND LEFT RING FINGER POSSIBLE AMPUTATION;  Surgeon: Orene Desanctis, MD;  Location: [facility];  Service: Orthopedics;  Laterality: Left;   SPLENECTOMY     Family History:  Family History  Problem Relation Age of Onset   COPD Mother    Diabetes Father    Stroke Maternal Grandmother    Alzheimer's disease Maternal Grandfather    Cancer Paternal Grandmother    Stomach cancer Paternal Grandfather    Hypertension Other    Colon cancer Neg Hx    Esophageal cancer Neg Hx    Colon polyps Neg Hx    Inflammatory bowel disease Neg Hx    Liver disease Neg Hx    Pancreatic cancer Neg Hx    Rectal cancer Neg Hx    Family Psychiatric  History: did not report   Social History:  Social History   Substance and Sexual Activity  Alcohol Use Not Currently     Social History   Substance and Sexual Activity  Drug Use Yes   Types: Marijuana    Social History   Socioeconomic History   Marital status: Single    Spouse name: Not on file   Number of children: 2   Years of education: 14   Highest education level: Not on file  Occupational History   Occupation: Nature conservation officer  Tobacco Use    Smoking status: Every Day    Packs/day: 1.00    Years: 30.00    Total pack years: 30.00    Types: Cigarettes   Smokeless tobacco: Former  Scientific laboratory technician Use: Never used  Substance and Sexual Activity   Alcohol use: Not Currently   Drug use: Yes    Types: Marijuana   Sexual activity: Yes    Comment:  wife had hysterectomy  Other Topics Concern   Not on file  Social History Narrative   ** Merged History Encounter **    Right handed   Unable to work   Lives with roommate and cousin   Drinks caffeine   One floor house   Social Determinants of Health  Financial Resource Strain: High Risk (07/15/2022)   Overall Financial Resource Strain (CARDIA)    Difficulty of Paying Living Expenses: Very hard  Food Insecurity: Food Insecurity Present (01/23/2023)   Hunger Vital Sign    Worried About Running Out of Food in the Last Year: Sometimes true    Ran Out of Food in the Last Year: Sometimes true  Transportation Needs: Unmet Transportation Needs (01/23/2023)   PRAPARE - Transportation    Lack of Transportation (Medical): Yes    Lack of Transportation (Non-Medical): Yes  Physical Activity: Inactive (07/15/2022)   Exercise Vital Sign    Days of Exercise per Week: 0 days    Minutes of Exercise per Session: 0 min  Stress: Stress Concern Present (07/15/2022)   Lares    Feeling of Stress : Very much  Social Connections: Socially Isolated (07/15/2022)   Social Connection and Isolation Panel [NHANES]    Frequency of Communication with Friends and Family: More than three times a week    Frequency of Social Gatherings with Friends and Family: Once a week    Attends Religious Services: Never    Marine scientist or Organizations: No    Attends Archivist Meetings: Never    Marital Status: Divorced   Additional Social History:    Allergies:  No Known Allergies  Labs:  Results for orders placed or  performed during the hospital encounter of 01/22/23 (from the past 48 hour(s))  CBC     Status: Abnormal   Collection Time: 02/08/23  7:43 AM  Result Value Ref Range   WBC 13.2 (H) 4.0 - 10.5 K/uL   RBC 2.48 (L) 4.22 - 5.81 MIL/uL   Hemoglobin 9.0 (L) 13.0 - 17.0 g/dL   HCT 26.5 (L) 39.0 - 52.0 %   MCV 106.9 (H) 80.0 - 100.0 fL   MCH 36.3 (H) 26.0 - 34.0 pg   MCHC 34.0 30.0 - 36.0 g/dL   RDW 19.6 (H) 11.5 - 15.5 %   Platelets 117 (L) 150 - 400 K/uL   nRBC 0.5 (H) 0.0 - 0.2 %    Comment: Performed at [facility], [place] [address] [place], [place] [id number]  Basic metabolic panel     Status: Abnormal   Collection Time: 02/08/23  7:43 AM  Result Value Ref Range   Sodium 136 135 - 145 mmol/L   Potassium 4.2 3.5 - 5.1 mmol/L   Chloride 104 98 - 111 mmol/L   CO2 26 22 - 32 mmol/L   Glucose, Bld 114 (H) 70 - 99 mg/dL    Comment: Glucose reference range applies only to samples taken after fasting for at least 8 hours.   BUN 31 (H) 6 - 20 mg/dL   Creatinine, Ser 1.23 0.61 - 1.24 mg/dL   Calcium 8.7 (L) 8.9 - 10.3 mg/dL   GFR, Estimated >60 >60 mL/min    Comment: (NOTE) Calculated using the CKD-EPI Creatinine Equation (2021)    Anion gap 6 5 - 15    Comment: Performed at [facility], [place] [address], [place], [place] [address]  Prepare fresh frozen plasma     Status: None   Collection Time: 02/08/23  2:54 PM  Result Value Ref Range   Unit Number [id number]    Blood Component Type THW PLS APHR    Unit division 00    Status of Unit ISSUED,FINAL    Transfusion Status  OK TO TRANSFUSE Performed at [place] [address], [place], [place] [address]   Protime-INR     Status: Abnormal   Collection Time: 02/08/23  2:54 PM  Result Value Ref Range   Prothrombin Time 23.5 (H) 11.4 - 15.2 seconds   INR 2.1 (H) 0.8 - 1.2    Comment: (NOTE) INR goal varies based on device and disease states. Performed at [facility], [place] [address], [place], [place] [address]   CBC     Status: Abnormal   Collection Time: 02/08/23  2:54 PM  Result Value Ref Range   WBC 14.6 (H) 4.0 - 10.5 K/uL   RBC 2.43 (L) 4.22 - 5.81 MIL/uL   Hemoglobin 8.9 (L) 13.0 - 17.0 g/dL   HCT 25.9 (L) 39.0 - 52.0 %   MCV 106.6 (H) 80.0 - 100.0 fL   MCH 36.6 (H) 26.0 - 34.0 pg   MCHC 34.4 30.0 - 36.0 g/dL   RDW 19.2 (H) 11.5 - 15.5 %   Platelets 118 (L) 150 - 400 K/uL   nRBC 0.5 (H) 0.0 - 0.2 %    Comment: Performed at [facility], [place] [address] [place], [place] [address]  Type and screen [place]     Status: None   Collection Time: 02/08/23  2:54 PM  Result Value Ref Range   ABO/RH(D) O POS    Antibody Screen NEG    Sample Expiration      02/11/2023,2359 Performed at [facility], [place] [address] [place], [place] [address]     Current Facility-Administered Medications  Medication Dose Route Frequency Provider Last Rate Last Admin   0.9 %  sodium chloride infusion  250 mL Intravenous PRN Toy Baker, MD       folic acid (FOLVITE) tablet 1 mg  1 mg Oral Daily Doutova, Anastassia, MD   1 mg at 02/09/23 [id number]   furosemide (LASIX) tablet 20 mg  20 mg Oral Daily Patrecia Pour, MD   20 mg at 02/09/23 [id number]   hydrocerin (EUCERIN) cream   Topical BID Patrecia Pour, MD   Given at 02/09/23 [phone]   metroNIDAZOLE (FLAGYL) tablet 500 mg  500 mg Oral Q12H Vance Gather B, MD   500 mg at 02/09/23 [id number]   morphine (PF) 2 MG/ML injection 2 mg  2 mg Intravenous Q3H PRN Toy Baker, MD   2 mg at 02/09/23 1228   ondansetron (ZOFRAN) injection 4 mg  4 mg Intravenous Q6H PRN Shelly Coss, MD   4 mg at 01/30/23 1038   Oral care mouth rinse  15 mL Mouth Rinse PRN Patrecia Pour, MD       oxyCODONE-acetaminophen (PERCOCET/ROXICET) 5-325 MG per tablet 2 tablet  2 tablet Oral Q4H PRN Nita Sells, MD   2 tablet at 02/09/23 [id number]   PARoxetine (PAXIL) tablet  10 mg  10 mg Oral Daily Kodie Kishi A   10 mg at 02/08/23 2110   polyethylene glycol (MIRALAX / GLYCOLAX) packet 17 g  17 g Oral Daily Shelly Coss, MD   17 g at 02/08/23 0904   QUEtiapine (SEROQUEL) tablet 25 mg  25 mg Oral QHS Lauralynn Loeb A   25 mg at 02/08/23 2110   risperiDONE (RISPERDAL M-TABS) disintegrating tablet 2 mg  2 mg Oral Q8H PRN Suella Broad, FNP       And   ziprasidone (GEODON) injection 20 mg  20 mg Intramuscular  PRN Suella Broad, FNP       sodium chloride flush (NS) 0.9 % injection 3 mL  3 mL Intravenous Q12H Doutova, Anastassia, MD   3 mL at 02/09/23 0930   sodium chloride flush (NS) 0.9 % injection 3 mL  3 mL Intravenous PRN Doutova, Anastassia, MD       spironolactone (ALDACTONE) tablet 50 mg  50 mg Oral Daily Patrecia Pour, MD   50 mg at 02/09/23 [id number]   thiamine (VITAMIN B1) tablet 100 mg  100 mg Oral Daily Toy Baker, MD   100 mg at 02/09/23 [id number]    Musculoskeletal: Strength & Muscle Tone: Laying in bed   Gait & Station: Laying in bed   Patient leans: Laying in bed     Psychiatric Specialty Exam:  Presentation  General Appearance:  Appropriate for Environment; Casual  Eye Contact: Good  Speech: Normal Rate  Speech Volume: Normal  Handedness: Ambidextrous   Mood and Affect  Mood: -- (Better, good)  Affect: Appropriate; Congruent   Thought Process  Thought Processes: Linear  Descriptions of Associations:Intact  Orientation:Full (Time, Place and Person)  Thought Content: Suicidal History of Schizophrenia/Schizoaffective disorder:No  Duration of Psychotic Symptoms:No data recorded Hallucinations: denies Ideas of Reference: denies Suicidal Thoughts: Yes, denies plan  Homicidal Thoughts: denies   Sensorium  Memory: Immediate Fair; Recent Fair  Judgment: Fair  Insight: Fair   Community education officer  Concentration: Fair  Attention Span: Fair  Recall: [place] of  Knowledge: Good  Language: Good   Psychomotor Activity  Psychomotor Activity: Psychomotor Activity: Normal     restless, rocking back and forth   Assets  Assets: Communication Skills; Housing; Social Support; Resilience   Sleep  Sleep: Fair Sleep: Good       Physical Exam: Minimal change since last exam Physical Exam Vitals reviewed.  HENT:     Head: Normocephalic and atraumatic.  Skin:    Coloration: Skin is jaundiced.  Neurological:     General: No focal deficit present.     Mental Status: He is alert and oriented to person, place, and time.  Psychiatric:        Attention and Perception: He does not perceive auditory or visual hallucinations.        Speech: Speech normal.        Behavior: Behavior is cooperative.        Thought Content: Thought content is not paranoid.        Judgment: Judgment is not impulsive.    Review of Systems  Constitutional: Negative.   Musculoskeletal:        Weakness  Skin:  Positive for rash.   Blood pressure (!) 112/57, pulse 72, temperature 98.6 F (37 C), temperature source Oral, resp. rate 15, height '5\' 9"'$  (1.753 m), weight 77.7 kg, SpO2 97 %. Body mass index is 25.3 kg/m.  Treatment Plan Summary:  Assessment: -- likely bipolar II  -GAD -PTSD -Hx of Alcohol use disorder This is a patient with a history of bipolar disorder and multiple lifetime suicide attempts here with end stage cirrhosis and a hand hemotoma. Has made many suicidal statements early in course of hospitalization, and initially refused every medicaiton offered out of a sense of futility due to cost. Over the past week (since about 2/22), he has consented to paxil and quetiapine, and has been sleeping much better with attendant improvements in mood, affect, and motivation to participate in PT. We have addressed numerous risk factors for suicidality (insomnia,  untreated depression) while medically admitted; he remains at chronic high risk due to  psychiatirc history and terminal diagnosis. At this point, pt no longer meets criteria for IVC in [place], which requires pt be an acute risk to self, and it was rescinded on 2/28. Psychiatry will continue to follow while admitted to provide further brief supportive psychotherapy, medication management (do not anticipate major future changes), and assistance in dc planning.   Notably, will likely require lower doses of psychotropic meds which are mostly metabolized by CYP system. Seems to be responding well to lower doses of medications than previous, no indication to increase. Will see again 3/6 if still hospitalized. Adding A1c, lipid panel onto prior daily labs as on antipsychotic.   Plan: --  stop ivc, sitter  -- c paxil 10 mg -- c quetiapine 25 mg QHS - consult SW to talk with patient, he would like some direction/assistance in regards to accessing social services  - consider palliative consult  (if agreeable to/qualifies for hospice, may be able to meet many of his needs)    Psychiatry will continue to follow.   Disposition: No evidence of imminent risk to self or others at present.   Patient does not meet criteria for psychiatric inpatient admission. Supportive therapy provided about ongoing stressors. Discussed crisis plan, support from social network, calling 911, coming to the Emergency Department, and calling Suicide Hotline.  Kerrie [place] Romolo Sieling 02/09/2023 3:56 PM

## 2023-02-09 NOTE — Progress Notes (Signed)
Physical Therapy Treatment Patient Details Name: Nathaniel Warren MRN: [id number] DOB: 1966/09/10 Today's Date: 02/09/2023   History of Present Illness 57 yo male with onset of groin and RT dorsal hand bruising and edema with reported light trauma during a fall.  Has h/o R foot drop without AFO. + for suicidal ideations.   PMHx:  etoh abuse, depression with h/o 4 suicide attempts, stroke, seizures, liver failure, R ankle fracture with ORIF, atherosclerosis, cirrhosis, diverticulosis, osteopenia    PT Comments    Pt assisted with ambulating short distance in hallway.  Pt self limited distance due to walking without his shoes (feels better and safer with shoes due to his right foot drop/weakness).  Requested shoes from staff. Per MD notes: "He has good rehabilitation potential but no way of pursuing rehabilitation outside of this facility (uninsured, no permanent home), so will continue therapy as much as possible to facilitate safe discharge."    Recommendations for follow up therapy are one component of a multi-disciplinary discharge planning process, led by the attending physician.  Recommendations may be updated based on patient status, additional functional criteria and insurance authorization.  Follow Up Recommendations  Outpatient PT (OPPT if possible, if not HHPT)     Assistance Recommended at Discharge Intermittent Supervision/Assistance  Patient can return home with the following A little help with walking and/or transfers;Direct supervision/assist for medications management;Assistance with cooking/housework;Assist for transportation;A little help with bathing/dressing/bathroom;Help with stairs or ramp for entrance   Equipment Recommendations  Other (comment) (left platform for RW)    Recommendations for Other Services       Precautions / Restrictions Precautions Precautions: Fall Precaution Comments: R foot drop Required Braces or Orthoses: Splint/Cast Splint/Cast: LT  D4 Restrictions Other Position/Activity Restrictions: Limited LT hand WB since I&D to D4     Mobility  Bed Mobility Overal bed mobility: Modified Independent             General bed mobility comments: HOB elevated    Transfers Overall transfer level: Needs assistance Equipment used: Left platform walker Transfers: Sit to/from Stand Sit to Stand: Min guard, From elevated surface           General transfer comment: Close Min guard for safety. Increased time. Cues provided for safety with limited use of left hand and swollen right hand    Ambulation/Gait Ambulation/Gait assistance: Min guard Gait Distance (Feet): 60 Feet Assistive device: Left platform walker Gait Pattern/deviations: Step-through pattern, Steppage, Narrow base of support Gait velocity: decreased     General Gait Details: MIn guard for safety. No LOB with RW use. pt ambulating in socks and would prefer shoes next time as he states shoe supports right foot (which has hx of foot drop and also increased supination during stance) - requested from RN and MD (pt's belongings not in room)   Stairs             Wheelchair Mobility    Modified Rankin (Stroke Patients Only)       Balance                                            Cognition Arousal/Alertness: Awake/alert Behavior During Therapy: WFL for tasks assessed/performed Overall Cognitive Status: Within Functional Limits for tasks assessed  Exercises      General Comments        Pertinent Vitals/Pain Pain Assessment Pain Assessment: Faces Faces Pain Scale: Hurts even more Pain Location: left hand and right hand Pain Descriptors / Indicators: Grimacing, Sore Pain Intervention(s): Repositioned, Monitored during session, Premedicated before session    Home Living                          Prior Function            PT Goals (current goals  can now be found in the care plan section) Progress towards PT goals: Progressing toward goals    Frequency    Min 3X/week      PT Plan Current plan remains appropriate    Co-evaluation              AM-PAC PT "6 Clicks" Mobility   Outcome Measure  Help needed turning from your back to your side while in a flat bed without using bedrails?: None Help needed moving from lying on your back to sitting on the side of a flat bed without using bedrails?: None Help needed moving to and from a bed to a chair (including a wheelchair)?: A Little Help needed standing up from a chair using your arms (e.g., wheelchair or bedside chair)?: A Little Help needed to walk in hospital room?: A Little Help needed climbing 3-5 steps with a railing? : A Lot 6 Click Score: 19    End of Session Equipment Utilized During Treatment: Gait belt Activity Tolerance: Patient tolerated treatment well Patient left: in bed;with call bell/phone within reach;with bed alarm set Nurse Communication: Mobility status PT Visit Diagnosis: Muscle weakness (generalized) (M62.81);Other abnormalities of gait and mobility (R26.89);History of falling (Z91.81)     Time: [id number] PT Time Calculation (min) (ACUTE ONLY): 19 min  Charges:  $Gait Training: 8-22 mins                    Jannette Spanner PT, DPT Physical Therapist Acute Rehabilitation Services Preferred contact method: Secure Chat Weekend Pager Only: [phone] Office: [place] 02/09/2023, 4:32 PM

## 2023-02-09 NOTE — TOC Progression Note (Signed)
Transition of Care [facility]) - Progression Note    Patient Details  Name: Nathaniel Warren MRN: [id number] Date of Birth: [birth year]  [facility] CM/SW [place], LCSW Phone Number: 02/09/2023, 3:56 PM  Clinical Narrative:     Pt not medically stable. per prior TOC note plan is to d/c to friend's home when stable. TOC continue to follow.    Expected Discharge Plan: Psychiatric Hospital Barriers to Discharge: Continued Medical Work up  Expected Discharge Plan and Services In-house Referral: Clinical Social Work Discharge Planning Services: NA Post Acute Care Choice: NA Living arrangements for the past 2 months: Single Family Home                 DME Arranged: N/A DME Agency: NA                   Social Determinants of Health (SDOH) Interventions [place]: Food Insecurity Present (01/23/2023)  Housing: Medium Risk (01/23/2023)  Transportation Needs: Unmet Transportation Needs (01/23/2023)  Utilities: Not At Risk (01/23/2023)  Alcohol Screen: Medium Risk (10/27/2017)  Depression (PHQ2-9): High Risk (12/18/2022)  Financial Resource Strain: High Risk (07/15/2022)  Physical Activity: Inactive (07/15/2022)  Social Connections: Socially Isolated (07/15/2022)  Stress: Stress Concern Present (07/15/2022)  Tobacco Use: High Risk (02/04/2023)    Readmission Risk Interventions     No data to display

## 2023-02-09 NOTE — Discharge Instructions (Addendum)
As always, please call [phone] or [phone] if you experience suicidal thoughts or otherwise cannot keep yourself safe. For psychiatric followup, I would recommend you go here:  [facility]): [address] [place], [place] [address]. This facility is open for urgent mental health crisis 24/7 as well as outpatient care during business hours. Call  [phone] for  current hours.   Information on my medicine - ELIQUIS (apixaban)  This medication education was reviewed with me or my healthcare representative as part of my discharge preparation.  The pharmacist that spoke with me during my hospital stay was:  Molly Maduro, Student-PharmD  Why was Eliquis prescribed for you? Eliquis was prescribed to treat blood clots that may have been found in the veins of your legs (deep vein thrombosis) or in your lungs (pulmonary embolism) and to reduce the risk of them occurring again.  What do You need to know about Eliquis ? The starting dose is 10 mg (two 5 mg tablets) taken TWICE daily for the FIRST SEVEN (7) DAYS, then on  02/26/2023  the dose is reduced to ONE 5 mg tablet taken TWICE daily.  Eliquis may be taken with or without food.   Try to take the dose about the same time in the morning and in the evening. If you have difficulty swallowing the tablet whole please discuss with your pharmacist how to take the medication safely.  Take Eliquis exactly as prescribed and DO NOT stop taking Eliquis without talking to the doctor who prescribed the medication.  Stopping may increase your risk of developing a new blood clot.  Refill your prescription before you run out.  After discharge, you should have regular check-up appointments with your healthcare provider that is prescribing your Eliquis.    What do you do if you miss a dose? If a dose of ELIQUIS is not taken at the scheduled time, take it as soon as possible on the same day and twice-daily administration should  be resumed. The dose should not be doubled to make up for a missed dose.  Important Safety Information A possible side effect of Eliquis is bleeding. You should call your healthcare provider right away if you experience any of the following: Bleeding from an injury or your nose that does not stop. Unusual colored urine (red or dark brown) or unusual colored stools (red or black). Unusual bruising for unknown reasons. A serious fall or if you hit your head (even if there is no bleeding).  Some medicines may interact with Eliquis and might increase your risk of bleeding or clotting while on Eliquis. To help avoid this, consult your healthcare provider or pharmacist prior to using any new prescription or non-prescription medications, including herbals, vitamins, non-steroidal anti-inflammatory drugs (NSAIDs) and supplements.  This website has more information on Eliquis (apixaban): http://[email or web address].com/eliquis/home

## 2023-02-09 NOTE — Progress Notes (Signed)
[facility] PROGRESS NOTE  Nathaniel Warren (DOB: 1966-07-14) [id number] PCP: [name], NP  Brief Narrative: Patient is a 57 year old male with history of alcohol abuse/cirrhosis of liver, depression, chronic right ankle pain, suicidal attempts presented to the emergency department with complaint of left groin swelling, right arm swelling.  Found to have hematoma of the left groin as well as swelling of the right arm consistent with hematoma.  CT hand showed diffuse swelling without any abscess.  Due to suicidal ideation, psychiatry was consulted.  Hand surgery also consulted.  Swelling  improved.  Hematoma remains stable.  Given few days of vitamin K given this hospitalization for elevated INR.  PT/OT recommending acute inpatient rehab/SNF but he has no payor source for this. Psychiatric medications initiated with improvement in mood, no longer requires inpatient psychiatric therapy. Continued bleeding noted from left hand, giving vitamin K, FFP, hand surgery following.    Hospital course remarkable for persistent pain and swelling of the right hand,finding of necrotic left 4th finger tip.  Hand surgery following, performed left ring finger I&D, nail plate removal, nailbed repair, soft tissue rearrangement <10sqcm for coverage of bone and left dorsal hand abscess I&D on 2/27.  Subjective: Had to rewrap left hand 3-4 times yesterday, ultimately placed ACE wrap compression with improvement. Also had bruises noticed on bilateral flanks without known trauma. Pain stable.   Objective: BP (!) 133/52 (BP Location: Right Leg)   Pulse 68   Temp 98.8 F (37.1 C) (Oral)   Resp 20   Ht '5\' 9"'$  (1.753 m)   Wt 77.7 kg   SpO2 96%   BMI 25.30 kg/m   Gen: Chronically ill-appearing pleasant male in no distress Pulm: Nonlabored  CV: RRR, 1+ pitting LE edema GI: Soft, NT, ND, +BS Neuro: Alert and oriented. No new focal deficits. Ext: Warm, digits on left with bluish discoloration through both  palms. ACE wrap over hand now in place. 4th digit dressing c/d/i Skin: Jaundice stable.    Assessment & Plan: Right arm/left groin swelling consistent with hematoma/necrotic left 4th finger tip:  - s/p vitamin K, FFP 2 units. Having some more bleeding of left surgical site, some clot vs. tissue in dorsum wound. Given FFP 3/3. ?if local measures would be helpful, defer to surgery. Giving vitamin K x3 days (3/2 - 3/4), Hgb stable at 8.9.  Elevate extremity and apply pressure dressing.  - Continue elevation, ice application, pain medication. - Blood cultures from 2/24 suspected contaminant group C strep in 1 of 2 (anaerobic bottle) - He was also found to have worsening of the ulcer on the left fourth fingertip, necrotic, foul-smelling. No abscess or osteomyelitis by MRI, s/p I&D with cultures on 2/27 by Dr. Greta Doom showed Corynebacterium and Prevotella (+beta lactamase). Suspect inadequate coverage was allowing refractory leukocytosis. Started on augmentin, transitioned to oritavancin x1 3/3 and flagyl '500mg'$  po BID per ID, Dr. Linus Salmons.  - Hand surgery following intermittently. Appreciate their reevaluation today.  - Continue OT, assistance. He has good rehabilitation potential but no way of pursuing rehabilitation outside of this facility (uninsured, no permanent home), so will continue therapy as much as possible to facilitate safe discharge.  Suicide attempt/PTSD:  - Continue paroxetine and quetiapine which have improved symptoms.  - No longer requires IVC or inpatient psychiatric hospitalization per psychiatry. IVC rescinded. Sitter discontinued. Pt appears stable.   Chronic pain syndrome: Has history of L5-S1 nerve compression with sciatica, chronic knee pain with foot drop.  Continue pain management, supportive  care.  - He may need to increase his mobility while inpatient before being discharged. He has no way of paying for SNF level rehabilitation and will likely be discharged home.    Spermatocele:  - Recommend ultrasound in 3 months   Alcohol-related hepatic cirrhosis: Quit EtOH in 2023.  Elevated INR, LFTs and bilirubin which is stable. - Recommend to follow-up with GI/hepatology as an outpatient. Follows with [facility]. - Restarted lasix/spiro at 20/50 doses, Cr up a bit but stable, still w/edema, no ascites. Will continue this and monitor.   Macrocytic anemia/thrombocytopenia: This is secondary to chronic alcohol abuse, cirrhosis. continue thiamine /folic acid.  Hemoglobin stable.  Normal folate, vitamin B12 high - Hgb relatively stable.    Nausea, vomiting: Had nausea with vomiting on 2/21.  Resolved after having a big bowel movement    Patrecia Pour, MD [facility] [email or web address] 02/09/2023, 12:37 PM

## 2023-02-09 NOTE — Progress Notes (Signed)
Physical exam of the right upper extremity shows a near complete resolution of the hematoma on the right he has full range of motion of the wrist and digits sensations intact light touch and brisk capillary refill the hematoma is almost totally resolved the ecchymosis is improving he has no pain on the side and pleased with the outcome.  As far as his left side goes he is postop status post left ring finger debridement.  The patie nt is doing well we did a bandage change on him today.  There was some dried blood present overall the digit is warm and well-perfused with brisk capillary refill the surgical site is clean dry with some sanguinous drainage.  Some swelling over the dorsal aspect of the hand.  Sensation intact to light touch over all digits.  No erythema.  The right-sided symptoms have resolved.  Full activities as tolerated no restrictions to the  right needs to be careful with any sort of trivial injuries to his hands due to coagulopathy.  Left hand bandage change.  Dressing change instructions provided for nursing staff to be changed daily.  OT order placed for finger range of motion edema as well as wrist range of motion.  Patient will follow-up with 10 to 14 days upon discharge from the hospital.

## 2023-02-10 LAB — CBC
HCT: 24.9 % — ABNORMAL LOW (ref 39.0–52.0)
Hemoglobin: 8.4 g/dL — ABNORMAL LOW (ref 13.0–17.0)
MCH: 36.7 pg — ABNORMAL HIGH (ref 26.0–34.0)
MCHC: 33.7 g/dL (ref 30.0–36.0)
MCV: 108.7 fL — ABNORMAL HIGH (ref 80.0–100.0)
Platelets: 111 10*3/uL — ABNORMAL LOW (ref 150–400)
RBC: 2.29 MIL/uL — ABNORMAL LOW (ref 4.22–5.81)
RDW: 20 % — ABNORMAL HIGH (ref 11.5–15.5)
WBC: 13.3 10*3/uL — ABNORMAL HIGH (ref 4.0–10.5)
nRBC: 0.3 % — ABNORMAL HIGH (ref 0.0–0.2)

## 2023-02-10 LAB — BASIC METABOLIC PANEL
Anion gap: 6 (ref 5–15)
BUN: 24 mg/dL — ABNORMAL HIGH (ref 6–20)
CO2: 26 mmol/L (ref 22–32)
Calcium: 8.3 mg/dL — ABNORMAL LOW (ref 8.9–10.3)
Chloride: 103 mmol/L (ref 98–111)
Creatinine, Ser: 1.13 mg/dL (ref 0.61–1.24)
GFR, Estimated: >60 mL/min (ref >60)
Glucose, Bld: 97 mg/dL (ref 70–99)
Potassium: 4.2 mmol/L (ref 3.5–5.1)
Sodium: 135 mmol/L (ref 135–145)

## 2023-02-10 LAB — HEMOGLOBIN A1C
Hgb A1c MFr Bld: 4.5 % — ABNORMAL LOW (ref 4.8–5.6)
Mean Plasma Glucose: 82 mg/dL

## 2023-02-10 MED ORDER — FUROSEMIDE 40 MG PO TABS
40.0000 mg | ORAL_TABLET | Freq: Every day | ORAL | Status: DC
  Administered 2023-02-11 – 2023-02-14 (×4): 40 mg via ORAL
  Filled 2023-02-10 (×4): qty 1

## 2023-02-10 NOTE — TOC Progression Note (Signed)
Transition of Care [facility]) - Progression Note    Patient Details  Name: Nathaniel Warren MRN: [id number] Date of Birth: [birth year]  [facility]) CM/SW Contact  Servando Snare, LCSW Phone Number: 02/10/2023, 9:55 AM  Clinical Narrative:   Now recommending HHPT/OT. Patient would need charity as he has no payor source. Per chart medicaid application pending.     Expected Discharge Plan: Psychiatric Hospital Barriers to Discharge: Continued Medical Work up  Expected Discharge Plan and Services In-house Referral: Clinical Social Work Discharge Planning Services: NA Post Acute Care Choice: NA Living arrangements for the past 2 months: Single Family Home                 DME Arranged: N/A DME Agency: NA                   Social Determinants of Health (SDOH) Interventions [place]: Food Insecurity Present (01/23/2023)  Housing: Medium Risk (01/23/2023)  Transportation Needs: Unmet Transportation Needs (01/23/2023)  Utilities: Not At Risk (01/23/2023)  Alcohol Screen: Medium Risk (10/27/2017)  Depression (PHQ2-9): High Risk (12/18/2022)  Financial Resource Strain: High Risk (07/15/2022)  Physical Activity: Inactive (07/15/2022)  Social Connections: Socially Isolated (07/15/2022)  Stress: Stress Concern Present (07/15/2022)  Tobacco Use: High Risk (02/04/2023)    Readmission Risk Interventions     No data to display

## 2023-02-10 NOTE — Progress Notes (Addendum)
[facility] PROGRESS NOTE  MASON MCELHONE (DOB: 06/02/1966) [id number] PCP: [name], NP  Brief Narrative: Patient is a 57 year old male with history of alcohol abuse/cirrhosis of liver, depression, chronic right ankle pain, suicidal attempts presented to the emergency department with complaint of left groin swelling, right arm swelling.  Found to have hematoma of the left groin as well as swelling of the right arm consistent with hematoma.  CT hand showed diffuse swelling without any abscess.  Due to suicidal ideation, psychiatry was consulted.  Hand surgery also consulted.  Swelling  improved.  Hematoma remains stable.  Given few days of vitamin K given this hospitalization for elevated INR.  PT/OT recommending acute inpatient rehab/SNF but he has no payor source for this. Psychiatric medications initiated with improvement in mood, no longer requires inpatient psychiatric therapy. Continued bleeding noted from left hand, giving vitamin K, FFP, hand surgery following.    Hospital course remarkable for persistent pain and swelling of the right hand, finding of necrotic left 4th finger tip. Hand surgery following, performed left ring finger I&D, nail plate removal, nailbed repair, soft tissue rearrangement <10sqcm for coverage of bone and left dorsal hand abscess I&D on 2/27. ID consulted for recommendations regarding wound culture results growing Corynebacterium and Prevotella, given oritavancin and flagyl.   Disposition is complicated by financial/insurance constraints with medicaid pending.   Subjective: Working with therapy, had a lot of pain with dressing change during surgery reevaluation last night. Left hand bled a fair amount afterwards but stopped and hasn't worsened overnight or today.  Objective: BP (!) 111/53 (BP Location: Right Leg)   Pulse 65   Temp 98.9 F (37.2 C) (Oral)   Resp 17   Ht '5\' 9"'$  (1.753 m)   Wt 77.7 kg   SpO2 97%   BMI 25.30 kg/m   Gen:  Chronically ill-appearing male in no distress Pulm: Clear, nonlabored  CV: RRR, no MRG, +dependent edema GI: Soft, NT, ND, +BS  Neuro: Alert and oriented. No new focal deficits. Ext: Warm, no deformities Skin: Left 4th digit dressing c/d/I, ACE wrap over hand with dried blood on dorsum, appears less swollen. Fingers warm with normal strength and sensation. Right dorsum swelling is going down.   Assessment & Plan: Right arm/left groin swelling consistent with hematoma/necrotic left 4th finger tip:  - s/p vitamin K, FFP 2 units early in hospitalization. Given FFP 3/3 and vitamin K x3 days (3/2 - 3/4) due to continued bleeding. Reassessed by hand surgery, wounds redressed, no new recommendations. Continue treating coagulopathy, monitoring Hgb.  - Continue elevation, ice application, pain medication. - Blood cultures from 2/24 suspected contaminant group C strep in 1 of 2 (anaerobic bottle) - He was also found to have worsening of the ulcer on the left fourth fingertip, necrotic, foul-smelling. No abscess or osteomyelitis by MRI, s/p I&D with cultures on 2/27 by Dr. Greta Doom showed Corynebacterium and Prevotella (+beta lactamase). Suspect inadequate coverage was allowing refractory leukocytosis. Started on augmentin, transitioned to oritavancin x1 3/3 and flagyl '500mg'$  po BID per ID, Dr. Linus Salmons.  - Continue OT, assistance. He has good rehabilitation potential but no way of pursuing rehabilitation outside of this facility (uninsured, no permanent home), so will continue therapy as much as possible to facilitate safe discharge.  Suicide attempt/PTSD:  - Continue paroxetine and quetiapine which have improved symptoms.  - No longer requires IVC or inpatient psychiatric hospitalization per psychiatry. IVC rescinded. Sitter discontinued. Pt appears stable.   Chronic pain syndrome: Has history  of L5-S1 nerve compression with sciatica, chronic knee pain with foot drop.  Continue pain management, supportive care.   - He may need to increase his mobility while inpatient before being discharged. He has no way of paying for SNF level rehabilitation and will likely be discharged home.   Spermatocele:  - Recommend ultrasound in 3 months   Alcohol-related hepatic cirrhosis: Quit EtOH in 2023.  Elevated INR, LFTs and bilirubin which is stable. - Recommend to follow-up with GI/hepatology as an outpatient. Follows with [facility]. - Restarted lasix/spiro at 20/50 doses, Cr up a bit but stable, still w/edema, no ascites. Will increase dose and monitor.   ABLA, Macrocytic anemia/thrombocytopenia: This is secondary to chronic alcohol abuse, cirrhosis. continue thiamine /folic acid.  Hemoglobin slightly downtrending with some ongoing bleeding related to coagulopathy. Normal folate, vitamin B12 high - Hgb will be monitored with plans to transfuse if needed.   Nausea, vomiting: Had nausea with vomiting on 2/21.  Resolved after having a big bowel movement    Patrecia Pour, MD [facility] [email or web address] 02/10/2023, 3:37 PM

## 2023-02-10 NOTE — Progress Notes (Addendum)
Patient refused to have dressing change on Left hand. RN informed pt about blood draining on dressing. Pt stated that, "dr came by and said not to change dressing; Let the hand rest and he would reassess tomorrow dressing today."

## 2023-02-10 NOTE — Progress Notes (Signed)
Occupational Therapy Treatment Patient Details Name: Nathaniel Warren MRN: [id number] DOB: 02-Apr-1966 Today's Date: 02/10/2023   History of present illness 57 yo male with onset of groin and RT dorsal hand bruising and edema with reported light trauma during a fall.  I&D to LT D4 on 02/03/23. Has h/o R foot drop without AFO. + for suicidal ideations.   PMHx:  etoh abuse, depression with h/o 4 suicide attempts, stroke, seizures, liver failure, R ankle fracture with ORIF, atherosclerosis, cirrhosis, diverticulosis, osteopenia.   OT comments  Patient progressing and showed improved recall of hand/wrist/forearm HEP and edema management techniques for RT and LT UEs but still with need of demonstration to remember all exercises, and continued reluctance to work on [place] activities with OT due to focus on hand.  Pt is already showing progress towards LT hand movement and measurements are included in this note as well as pt's dry erase board in his room.  Patient remains limited by pain esp to LT hand D4-D5, difficulty recalling HEP, generalized weakness and decreased activity tolerance along with deficits noted below. Pt continues to demonstrate good rehab potential and would benefit from continued skilled OT to increase safety and independence with ADLs and functional transfers to allow pt to return home safely and reduce caregiver burden and fall risk.    Recommendations for follow up therapy are one component of a multi-disciplinary discharge planning process, led by the attending physician.  Recommendations may be updated based on patient status, additional functional criteria and insurance authorization.    Follow Up Recommendations  Skilled nursing-short term rehab (<3 hours/day)     Assistance Recommended at Discharge Frequent or constant Supervision/Assistance  Patient can return home with the following  A little help with bathing/dressing/bathroom;A little help with walking and/or  transfers;Assistance with cooking/housework;Assist for transportation;Direct supervision/assist for medications management   Equipment Recommendations  None recommended by OT    Recommendations for Other Services      Precautions / Restrictions Precautions Precautions: Fall Precaution Comments: R foot drop Restrictions Weight Bearing Restrictions: No Other Position/Activity Restrictions: Limited LT hand WB since I&D to D4       Mobility Bed Mobility                    Transfers                         Balance                                           ADL either performed or assessed with clinical judgement   ADL Overall ADL's : Needs assistance/impaired Eating/Feeding: Set up;Supervision/ safety;Bed level Eating/Feeding Details (indicate cue type and reason): Pt required assistance to open cereal and mild containers due to LT hand pain and decreased ROM/strength. Pt declined getting up to chair for breakfast or lunch.                                        Extremity/Trunk Assessment Upper Extremity Assessment RUE Deficits / Details: Now using RT hand functionally in all ADLs. Stoll with swelling and discoloration to dorsum of hand. LUE Deficits / Details: Shoulder/elbow WFL. Able to supinate with significantly increased time and pain. Edema: LT D2: 7.5cm at PIP.  D3: 7.7cm at PIP.  D3 to [facility]: 5/5 cm. LUE Coordination: decreased fine motor            Vision       Perception     Praxis      Cognition Arousal/Alertness: Awake/alert Behavior During Therapy: WFL for tasks assessed/performed Overall Cognitive Status: Within Functional Limits for tasks assessed                                          Exercises Other Exercises Other Exercises: Located pt's squeeze ball for RT hand in his bags after move from 4th to 5th floor and provided. Pt began using immeidiately. Pt educated on LEFT hand  exercises and performed 10 reps of finger flex/ext, wrist flex/ext, wrist RD/UD, finger adduction/abduction, MP hyperextention, and 3 reps sup/pronation-terminated due to increasing pain. Pt shown edema mangement techniques to each arm and demonstrated understanding. Pt does still have trouble recalling exercises and does not refer to his HEP handouts.    Shoulder Instructions       General Comments      Pertinent Vitals/ Pain       Pain Assessment Pain Assessment: 0-10 Pain Score: 7  Pain Location: LT hand during exercises Pain Descriptors / Indicators: Grimacing, Sore, Moaning Pain Intervention(s): Premedicated before session, Monitored during session, Repositioned, Limited activity within patient's tolerance (elevated)  Home Living                                          Prior Functioning/Environment              Frequency  Min 2X/week        Progress Toward Goals  OT Goals(current goals can now be found in the care plan section)  Progress towards OT goals: Progressing toward goals;Goals met and updated - see care plan (Goals added for LT hand)  Acute Rehab OT Goals Patient Stated Goal: Improve LT hand function to match progress on RT hand. OT Goal Formulation: With patient Time For Goal Achievement: 02/20/23 Potential to Achieve Goals: Good ADL Goals Pt/caregiver will Perform Home Exercise Program: Both right and left upper extremity;With written HEP provided;Increased ROM (decreased edema strategies: 3/5: LT D3 at PIP: 7.7 cm; D2 7.5 cm at PIP) Additional ADL Goal #2: (P) Pt will imrpove LT hand flexion to allow LT D3 to flex sufficiently to <0.5 cm from Distal palmer crease in order to support composite grip for RW and ADLs.  Plan Discharge plan needs to be updated    Co-evaluation                 AM-PAC OT "6 Clicks" Daily Activity     Outcome Measure   Help from another person eating meals?: A Little Help from another person  taking care of personal grooming?: A Little Help from another person toileting, which includes using toliet, bedpan, or urinal?: A Little Help from another person bathing (including washing, rinsing, drying)?: A Little Help from another person to put on and taking off regular upper body clothing?: A Little Help from another person to put on and taking off regular lower body clothing?: A Little 6 Click Score: 18    End of Session    OT Visit Diagnosis: Pain;History of falling (Z91.81);Repeated falls (R29.6);Muscle  weakness (generalized) (M62.81);Unsteadiness on feet (R26.81) Pain - Right/Left: Left Pain - part of body: Hand   Activity Tolerance Patient limited by pain   Patient Left in bed;with call bell/phone within reach;with nursing/sitter in room;with bed alarm set   Nurse Communication Other (comment) (Hand off to surgeon in room as OT exiting)        Time: [id number] OT Time Calculation (min): 30 min  Charges: OT General Charges $OT Visit: 1 Visit OT Treatments $Therapeutic Exercise: 23-37 mins  Anderson Malta, [place] Office: [phone] 02/10/2023   Julien Girt 02/10/2023, 1:28 PM

## 2023-02-11 LAB — CBC
HCT: 25 % — ABNORMAL LOW (ref 39.0–52.0)
Hemoglobin: 8.7 g/dL — ABNORMAL LOW (ref 13.0–17.0)
MCH: 37.2 pg — ABNORMAL HIGH (ref 26.0–34.0)
MCHC: 34.8 g/dL (ref 30.0–36.0)
MCV: 106.8 fL — ABNORMAL HIGH (ref 80.0–100.0)
Platelets: 109 10*3/uL — ABNORMAL LOW (ref 150–400)
RBC: 2.34 MIL/uL — ABNORMAL LOW (ref 4.22–5.81)
RDW: 19.8 % — ABNORMAL HIGH (ref 11.5–15.5)
WBC: 13.1 10*3/uL — ABNORMAL HIGH (ref 4.0–10.5)
nRBC: 0 % (ref 0.0–0.2)

## 2023-02-11 LAB — BASIC METABOLIC PANEL
Anion gap: 5 (ref 5–15)
BUN: 21 mg/dL — ABNORMAL HIGH (ref 6–20)
CO2: 25 mmol/L (ref 22–32)
Calcium: 8.2 mg/dL — ABNORMAL LOW (ref 8.9–10.3)
Chloride: 103 mmol/L (ref 98–111)
Creatinine, Ser: 1.1 mg/dL (ref 0.61–1.24)
GFR, Estimated: >60 mL/min (ref >60)
Glucose, Bld: 104 mg/dL — ABNORMAL HIGH (ref 70–99)
Potassium: 4.1 mmol/L (ref 3.5–5.1)
Sodium: 133 mmol/L — ABNORMAL LOW (ref 135–145)

## 2023-02-11 NOTE — Consult Note (Signed)
Nathaniel Warren    Patient Identification: Nathaniel Warren MRN:  [id number] Principal Diagnosis: Spontaneous hematoma of lower leg Diagnosis:  Principal Problem:   Spontaneous hematoma of lower leg Active Problems:   MDD (major depressive disorder), recurrent severe, without psychosis ([place])   Hyponatremia   Tobacco abuse   Alcoholic cirrhosis of liver with ascites ([place])   Suicidal ideations   Total Time spent with patient: 20 minutes  Subjective:   Nathaniel Warren is a 57 year old male with right hand injury, thigh hematoma.  He has a psychiatric h/o bipolar d/o vs mdd, gad, ptsd, alcohol use disorder.   Initial:  Nathaniel Warren is a 57 y.o. male patient admitted with .hx for polysubstance abuse, ETOH use, nerve compression L5-S1, MDD, suicide attempt x 4-who endorses suicidal ideations, and intent.  He is guarded and does not disclose the plan.  He is triggered by his chronic nerve pain, financial losses and medical comorbidities. Patient is disgruntled and frustrated throughout the majority of the evaluation. He ruminates and perseverates about multiple psychosocial stressors with a large contribution being finances, lack of access, and underlying mood disorder.  Patient with feelings of hopelessness and despair, impaired judgement and lack of insight presents an acutely high safety risk and continues to be appropriate for inpatient psychiatric admission where he can be monitored for safety and stabilized on medications. He meets criteria for IVC which will be upheld at this time.    He was alert, oriented, and cooperative with interview. Has been moved to new room. Discussed again medical setbacks since last evaluation, pt seems to be taking well (still using humor as mature defense mechanism). Had a lot more questions about stage, prognosis for cirrhosis than previously, encouraged f/w with primary. Coming up on 70manniversary of son's death, reaching out  to/strengthening relationship with daughter.  Continues to be future oriented throughout exam - current goal is to work with PT here so he can avoid SNF. Overall very in tune with current medical plan. Sleep is a little less artificial.  No untoward s/e on of meds.  Noted to have ongoing broadening of affect, smiling and laughing at times. No SI, HI, AH/VH - would "definitely" reach out to either nursing or AMitzi Hansenif this changed.     Spoke to friend AMitzi Hansenw/ pt consent 2/23 He was aware that JCobewas being seen by psychiatry. Caretaker is 76omething years old, has been taking care of him for a year and a half. He has been generally laying at home, sick, refusing to take care of himself. He pays for most of pt's medications and needs out of his fixed income. He shares similar concerns about inability to get SSD. He does share that he was unable to pay for pt's mental health medications. He has mentioned suicide to AStrathmoor Manorseveral times in the context of feeling like a burden and being better off dead. He has just now opened up to AArgentaabout how he has been feeling. He had to force JAvondreto come to the hospital to start with, he is aware that probably doesn't have long to live regardless of medical intervention. He hasn't seen much of an improvement in pt's mental health, thinks he is putting on a front for mental health workers. He does not want JStephanto leave the hospital until he is 100% stable because of how hard it was to get him to go to the hospital - had to threaten him with  eviction. Friend asks about hospice. [name] thinks a psych hospital would be helpful  but understands Charity is not medically stable to think about that. [name] notes that Lucion does take his medication and responds well when he can afford it. Has had at least 3 falls in the house.   2/28  Called and discussed rescinding IVC for inpt psych. [name] has ongoing concerns about pt's likelyhood of decompensation if his basic needs  (food, healthcare) are not met, and some concerns that pt is putting on a good front, but all of these are more chronic than acute. He has not endorsed suicidal ideations in last several days. Expresses general dissatisfaction with care (no one was called when pt rushed into surgery, pt has not seen a SW); validated these feelings as appropriate.    Past Psychiatric History: bipolar d/o vs mdd, gad, ptsd, alcohol use disorder.   Risk to Self:  yes  Risk to Others:  impulsive but denying HI  Prior Inpatient Therapy:   Prior Outpatient Therapy:    Past Medical History:  Past Medical History:  Diagnosis Date   Depression    Hernia, abdominal    Liver failure ([place])    Seizures ([place])    Stroke [facility])     Past Surgical History:  Procedure Laterality Date   ANKLE SURGERY     ANKLE SURGERY Right    HERNIA REPAIR     INCISION AND DRAINAGE OF WOUND Left 02/03/2023   Procedure: IRRIGATION AND DEBRIDEMENT WOUND LEFT RING FINGER POSSIBLE AMPUTATION;  Surgeon: Orene Desanctis, MD;  Location: [facility];  Service: Orthopedics;  Laterality: Left;   SPLENECTOMY     Family History:  Family History  Problem Relation Age of Onset   COPD Mother    Diabetes Father    Stroke Maternal Grandmother    Alzheimer's disease Maternal Grandfather    Cancer Paternal Grandmother    Stomach cancer Paternal Grandfather    Hypertension Other    Colon cancer Neg Hx    Esophageal cancer Neg Hx    Colon polyps Neg Hx    Inflammatory bowel disease Neg Hx    Liver disease Neg Hx    Pancreatic cancer Neg Hx    Rectal cancer Neg Hx    Family Psychiatric  History: did not report   Social History:  Social History   Substance and Sexual Activity  Alcohol Use Not Currently     Social History   Substance and Sexual Activity  Drug Use Yes   Types: Marijuana    Social History   Socioeconomic History   Marital status: Single    Spouse name: Not on file   Number of children: 2   Years of education: 14    Highest education level: Not on file  Occupational History   Occupation: Nature conservation officer  Tobacco Use   Smoking status: Every Day    Packs/day: 1.00    Years: 30.00    Total pack years: 30.00    Types: Cigarettes   Smokeless tobacco: Former  Scientific laboratory technician Use: Never used  Substance and Sexual Activity   Alcohol use: Not Currently   Drug use: Yes    Types: Marijuana   Sexual activity: Yes    Comment:  wife had hysterectomy  Other Topics Concern   Not on file  Social History Narrative   ** Merged History Encounter **    Right handed   Unable to work   Lives with roommate and  cousin   Drinks caffeine   One floor house   Social Determinants of Health   Financial Resource Strain: High Risk (07/15/2022)   Overall Financial Resource Strain (CARDIA)    Difficulty of Paying Living Expenses: Very hard  Food Insecurity: Food Insecurity Present (01/23/2023)   Hunger Vital Sign    Worried About Running Out of Food in the Last Year: Sometimes true    Ran Out of Food in the Last Year: Sometimes true  Transportation Needs: Unmet Transportation Needs (01/23/2023)   PRAPARE - Hydrologist (Medical): Yes    Lack of Transportation (Non-Medical): Yes  Physical Activity: Inactive (07/15/2022)   Exercise Vital Sign    Days of Exercise per Week: 0 days    Minutes of Exercise per Session: 0 min  Stress: Stress Concern Present (07/15/2022)   [place]    Feeling of Stress : Very much  Social Connections: Socially Isolated (07/15/2022)   Social Connection and Isolation Panel [NHANES]    Frequency of Communication with Friends and Family: More than three times a week    Frequency of Social Gatherings with Friends and Family: Once a week    Attends Religious Services: Never    Marine scientist or Organizations: No    Attends Archivist Meetings: Never    Marital Status: Divorced    Additional Social History:    Allergies:  No Known Allergies  Labs:  Results for orders placed or performed during the hospital encounter of 01/22/23 (from the past 48 hour(s))  Hemoglobin A1c     Status: Abnormal   Collection Time: 02/09/23  4:48 PM  Result Value Ref Range   Hgb A1c MFr Bld 4.5 (L) 4.8 - 5.6 %    Comment: (NOTE)         Prediabetes: 5.7 - 6.4         Diabetes: >6.4         Glycemic control for adults with diabetes: <7.0    Mean Plasma Glucose 82 mg/dL    Comment: (NOTE) Performed At: [facility] [place], [place] [id number] Rush Farmer MD [id number]   Lipid panel     Status: Abnormal   Collection Time: 02/09/23  4:48 PM  Result Value Ref Range   Cholesterol 186 0 - 200 mg/dL   Triglycerides 67 <150 mg/dL   HDL 18 (L) >40 mg/dL   Total CHOL/HDL Ratio 10.3 RATIO   VLDL 13 0 - 40 mg/dL   LDL Cholesterol 155 (H) 0 - 99 mg/dL    Comment:        Total Cholesterol/HDL:CHD Risk Coronary Heart Disease Risk Table                     Men   Women  1/2 Average Risk   3.4   3.3  Average Risk       5.0   4.4  2 X Average Risk   9.6   7.1  3 X Average Risk  23.4   11.0        Use the calculated Patient Ratio above and the CHD Risk Table to determine the patient's CHD Risk.        ATP III CLASSIFICATION (LDL):  <100     mg/dL   Optimal  100-129  mg/dL   Near or Above  Optimal  130-159  mg/dL   Borderline  160-189  mg/dL   High  >190     mg/dL   Very High Performed at [facility] [address], [place], [place] [id number]   Basic metabolic panel     Status: Abnormal   Collection Time: 02/10/23  4:07 AM  Result Value Ref Range   Sodium 135 135 - 145 mmol/L   Potassium 4.2 3.5 - 5.1 mmol/L   Chloride 103 98 - 111 mmol/L   CO2 26 22 - 32 mmol/L   Glucose, Bld 97 70 - 99 mg/dL    Comment: Glucose reference range applies only to samples taken after fasting for at least 8 hours.   BUN 24  (H) 6 - 20 mg/dL   Creatinine, Ser 1.13 0.61 - 1.24 mg/dL   Calcium 8.3 (L) 8.9 - 10.3 mg/dL   GFR, Estimated >60 >60 mL/min    Comment: (NOTE) Calculated using the CKD-EPI Creatinine Equation (2021)    Anion gap 6 5 - 15    Comment: Performed at [facility], [place] [address], [place], [place] [address]  CBC     Status: Abnormal   Collection Time: 02/10/23  4:07 AM  Result Value Ref Range   WBC 13.3 (H) 4.0 - 10.5 K/uL   RBC 2.29 (L) 4.22 - 5.81 MIL/uL   Hemoglobin 8.4 (L) 13.0 - 17.0 g/dL   HCT 24.9 (L) 39.0 - 52.0 %   MCV 108.7 (H) 80.0 - 100.0 fL   MCH 36.7 (H) 26.0 - 34.0 pg   MCHC 33.7 30.0 - 36.0 g/dL   RDW 20.0 (H) 11.5 - 15.5 %   Platelets 111 (L) 150 - 400 K/uL   nRBC 0.3 (H) 0.0 - 0.2 %    Comment: Performed at [facility], [place] [address], [place], [place] [address]  CBC     Status: Abnormal   Collection Time: 02/11/23  4:18 AM  Result Value Ref Range   WBC 13.1 (H) 4.0 - 10.5 K/uL   RBC 2.34 (L) 4.22 - 5.81 MIL/uL   Hemoglobin 8.7 (L) 13.0 - 17.0 g/dL   HCT 25.0 (L) 39.0 - 52.0 %   MCV 106.8 (H) 80.0 - 100.0 fL   MCH 37.2 (H) 26.0 - 34.0 pg   MCHC 34.8 30.0 - 36.0 g/dL   RDW 19.8 (H) 11.5 - 15.5 %   Platelets 109 (L) 150 - 400 K/uL   nRBC 0.0 0.0 - 0.2 %    Comment: Performed at [facility], [place] [address], [place], [place] [id number]  Basic metabolic panel     Status: Abnormal   Collection Time: 02/11/23  4:18 AM  Result Value Ref Range   Sodium 133 (L) 135 - 145 mmol/L   Potassium 4.1 3.5 - 5.1 mmol/L   Chloride 103 98 - 111 mmol/L   CO2 25 22 - 32 mmol/L   Glucose, Bld 104 (H) 70 - 99 mg/dL    Comment: Glucose reference range applies only to samples taken after fasting for at least 8 hours.   BUN 21 (H) 6 - 20 mg/dL   Creatinine, Ser 1.10 0.61 - 1.24 mg/dL   Calcium 8.2 (L) 8.9 - 10.3 mg/dL   GFR, Estimated >60 >60 mL/min    Comment: (NOTE) Calculated using the CKD-EPI Creatinine Equation  (2021)    Anion gap 5 5 - 15    Comment: Performed at [facility],  [place] [address] [place], [place] [address]    Current Facility-Administered Medications  Medication Dose Route Frequency Provider Last Rate Last Admin   0.9 %  sodium chloride infusion  250 mL Intravenous PRN Toy Baker, MD       folic acid (FOLVITE) tablet 1 mg  1 mg Oral Daily Doutova, Anastassia, MD   1 mg at 02/11/23 0945   furosemide (LASIX) tablet 40 mg  40 mg Oral Daily Patrecia Pour, MD   40 mg at 02/11/23 0944   hydrocerin (EUCERIN) cream   Topical BID Patrecia Pour, MD   1 Application at 0000000 0946   metroNIDAZOLE (FLAGYL) tablet 500 mg  500 mg Oral Q12H Shelly Coss, MD   500 mg at 02/11/23 0945   morphine (PF) 2 MG/ML injection 2 mg  2 mg Intravenous Q3H PRN Toy Baker, MD   2 mg at 02/10/23 1649   ondansetron (ZOFRAN) injection 4 mg  4 mg Intravenous Q6H PRN Shelly Coss, MD   4 mg at 01/30/23 1038   Oral care mouth rinse  15 mL Mouth Rinse PRN Patrecia Pour, MD       oxyCODONE-acetaminophen (PERCOCET/ROXICET) 5-325 MG per tablet 2 tablet  2 tablet Oral Q4H PRN Nita Sells, MD   2 tablet at 02/11/23 0944   PARoxetine (PAXIL) tablet 10 mg  10 mg Oral Daily Jalisha Enneking A   10 mg at 02/10/23 2038   polyethylene glycol (MIRALAX / GLYCOLAX) packet 17 g  17 g Oral Daily Shelly Coss, MD   17 g at 02/08/23 [id number]   QUEtiapine (SEROQUEL) tablet 25 mg  25 mg Oral QHS Jenaro Souder A   25 mg at 02/10/23 2037   risperiDONE (RISPERDAL M-TABS) disintegrating tablet 2 mg  2 mg Oral Q8H PRN Suella Broad, FNP       And   ziprasidone (GEODON) injection 20 mg  20 mg Intramuscular PRN Starkes-Perry, Gayland Curry, FNP       sodium chloride flush (NS) 0.9 % injection 3 mL  3 mL Intravenous Q12H Doutova, Anastassia, MD   3 mL at 02/11/23 1026   sodium chloride flush (NS) 0.9 % injection 3 mL  3 mL Intravenous PRN Doutova, Anastassia, MD       spironolactone  (ALDACTONE) tablet 50 mg  50 mg Oral Daily Patrecia Pour, MD   50 mg at 02/11/23 0945   thiamine (VITAMIN B1) tablet 100 mg  100 mg Oral Daily Toy Baker, MD   100 mg at 02/11/23 0945    Musculoskeletal: Strength & Muscle Tone: Laying in bed   Gait & Station: Laying in bed   Patient leans: Laying in bed     Psychiatric Specialty Exam:  Presentation  General Appearance:  Appropriate for Environment  Eye Contact: Good  Speech: Normal Rate  Speech Volume: Normal  Handedness: Ambidextrous   Mood and Affect  Mood: -- (Good)  Affect: Appropriate; Congruent   Thought Process  Thought Processes: Linear  Descriptions of Associations:Intact  Orientation:Full (Time, Place and Person)  Thought Content: Suicidal History of Schizophrenia/Schizoaffective disorder:No  Duration of Psychotic Symptoms:No data recorded Hallucinations: denies Ideas of Reference: denies Suicidal Thoughts: Yes, denies plan  Homicidal Thoughts: denies   Sensorium  Memory: Immediate Fair; Recent Fair  Judgment: Fair  Insight: Fair   Community education officer  Concentration: Fair  Attention Span: Good  Recall: Good  Fund of Knowledge: Good  Language: Good   Psychomotor Activity  Psychomotor Activity: Psychomotor Activity:  Normal      restless, rocking back and forth   Assets  Assets: Armed forces logistics/support/administrative officer; Housing; Social Support; Resilience   Sleep  Sleep: Fair Sleep: Good        Physical Exam: Minimal change since last exam Physical Exam Vitals reviewed.  HENT:     Head: Normocephalic and atraumatic.  Skin:    Coloration: Skin is jaundiced.  Neurological:     General: No focal deficit present.     Mental Status: He is alert and oriented to person, place, and time.  Psychiatric:        Attention and Perception: He does not perceive auditory or visual hallucinations.        Speech: Speech normal.        Behavior: Behavior is  cooperative.        Thought Content: Thought content is not paranoid.        Judgment: Judgment is not impulsive.    Review of Systems  Constitutional: Negative.   Musculoskeletal:        Weakness  Skin:  Positive for rash.   Blood pressure (!) 109/50, pulse 74, temperature 98.9 F (37.2 C), temperature source Oral, resp. rate 16, height '5\' 9"'$  (1.753 m), weight 77.7 kg, SpO2 96 %. Body mass index is 25.3 kg/m.  Treatment Plan Summary:  Assessment: -- likely bipolar II  -GAD -PTSD -Hx of Alcohol use disorder This is a patient with a history of bipolar disorder and multiple lifetime suicide attempts here with end stage cirrhosis and a hand hemotoma. Has made many suicidal statements early in course of hospitalization, and initially refused every medicaiton offered out of a sense of futility due to cost. Over the past several evaluations (since about 2/22), he has consented to paxil and quetiapine, and has been sleeping much better with attendant improvements in mood, affect, and motivation to participate in PT. We have addressed numerous risk factors for suicidality (insomnia, untreated depression) while medically admitted; he remains at chronic high risk due to psychiatirc history and terminal diagnosis. At this point, pt no longer meets criteria for IVC in [place], which requires pt be an acute risk to self, and it was rescinded on 2/28. Psychiatry will continue to follow while admitted to provide further brief supportive psychotherapy, medication management (do not anticipate major future changes), and assistance in dc planning; pt has expressed gratitude for ongoing support.   Notably, will likely require lower doses of psychotropic meds which are mostly metabolized by CYP system in liver. Seems to be responding well to lower doses of medications than previous, no indication to increase at this time. Will see again 3/11 if still hospitalized. Please request earlier Warren if desired.  Adding  A1c, lipid panel onto prior daily labs as on antipsychotic.   Plan: --  stop ivc, sitter  -- c paxil 10 mg -- c quetiapine 25 mg QHS - Warren SW to talk with patient, he would like some direction/assistance in regards to accessing social services  - consider palliative Warren  (if agreeable to/qualifies for hospice, may be able to meet many of his needs)   A1c 4.5 (good), lipid panel low HDL, high LDL, trig wnl - not eligible statin 2/2 cirrhosis per primary  Psychiatry will continue to follow.   Disposition: No evidence of imminent risk to self or others at present.   Patient does not meet criteria for psychiatric inpatient admission. Supportive therapy provided about ongoing stressors. Discussed crisis plan, support from social network, calling  911, coming to the Emergency Department, and calling Suicide Hotline.  Kerrie Buffalo Kynlea Blackston 02/11/2023 4:35 PM

## 2023-02-11 NOTE — Progress Notes (Addendum)
Physical Therapy Treatment Patient Details Name: Nathaniel Warren MRN: [id number] DOB: 1966-10-19 Today's Date: 02/11/2023   History of Present Illness 57 yo male with onset of groin and RT dorsal hand bruising and edema with reported light trauma during a fall.  I&D to LT D4 on 02/03/23. Has h/o R foot drop without AFO. + for suicidal ideations.   PMHx:  etoh abuse, depression with h/o 4 suicide attempts, stroke, seizures, liver failure, R ankle fracture with ORIF, atherosclerosis, cirrhosis, diverticulosis, osteopenia.    PT Comments    General Comments: AxO x 3 "moving better".  "Still a little shaky".  "I have cirrhosis cause I drink too much". Assisted with amb in hallway went well. General bed mobility comments: self able wityh increased time.  General transfer comment: Close Min guard for safety. Increased time. Cues provided for safety with limited use of left hand and swollen right hand.  General Gait Details: applied shoes.  pt stated he does better with his shoes.  Pt has right foot drop.  Present with less ankle roll wearing shoes.  Pt tolerated an increased distance.  heavy lean on walker.   Recommendations for follow up therapy are one component of a multi-disciplinary discharge planning process, led by the attending physician.  Recommendations may be updated based on patient status, additional functional criteria and insurance authorization.  Follow Up Recommendations  Outpatient PT     Assistance Recommended at Discharge Intermittent Supervision/Assistance  Patient can return home with the following A little help with walking and/or transfers;Direct supervision/assist for medications management;Assistance with cooking/housework;Assist for transportation;A little help with bathing/dressing/bathroom;Help with stairs or ramp for entrance   Equipment Recommendations  Rolling walker (2 wheels);Other (comment) (L platform)    Recommendations for Other Services       Precautions /  Restrictions Precautions Precautions: Fall Precaution Comments: R foot drop, does better with his shoes on (less ankle roll) Required Braces or Orthoses: Splint/Cast Splint/Cast: LT ring finger     Mobility  Bed Mobility Overal bed mobility: Modified Independent             General bed mobility comments: self able wityh increased time    Transfers Overall transfer level: Needs assistance Equipment used: Left platform walker Transfers: Sit to/from Stand Sit to Stand: Min guard, From elevated surface           General transfer comment: Close Min guard for safety. Increased time. Cues provided for safety with limited use of left hand and swollen right hand    Ambulation/Gait Ambulation/Gait assistance: Supervision, Min guard Gait Distance (Feet): 220 Feet Assistive device: Left platform walker, Rolling walker (2 wheels) Gait Pattern/deviations: Step-through pattern, Steppage, Narrow base of support Gait velocity: decreased     General Gait Details: applied shoes.  pt stated he does better with his shoes.  Pt has right foot drop.  Present with less ankle roll wearing shoes.  Pt tolerated an increased distance.  heavy lean on walker.   Stairs             Wheelchair Mobility    Modified Rankin (Stroke Patients Only)       Balance                                            Cognition Arousal/Alertness: Awake/alert Behavior During Therapy: WFL for tasks assessed/performed Overall Cognitive Status: Within Functional Limits  for tasks assessed                                 General Comments: AxO x 3 "moving better".  "Still a little shaky".  "I have cirrhosis cause I drink too much".        Exercises      General Comments        Pertinent Vitals/Pain Pain Assessment Faces Pain Scale: Hurts a little bit Pain Location: LT hand during activity Pain Descriptors / Indicators: Grimacing, Sore, Moaning Pain  Intervention(s): Monitored during session    Home Living                          Prior Function            PT Goals (current goals can now be found in the care plan section) Progress towards PT goals: Progressing toward goals    Frequency    Min 3X/week      PT Plan      Co-evaluation              AM-PAC PT "6 Clicks" Mobility   Outcome Measure  Help needed turning from your back to your side while in a flat bed without using bedrails?: None Help needed moving from lying on your back to sitting on the side of a flat bed without using bedrails?: None Help needed moving to and from a bed to a chair (including a wheelchair)?: A Little Help needed standing up from a chair using your arms (e.g., wheelchair or bedside chair)?: A Little Help needed to walk in hospital room?: A Little Help needed climbing 3-5 steps with a railing? : A Lot 6 Click Score: 19    End of Session Equipment Utilized During Treatment: Gait belt Activity Tolerance: Patient tolerated treatment well Patient left: in bed;with call bell/phone within reach;with bed alarm set Nurse Communication: Mobility status PT Visit Diagnosis: Muscle weakness (generalized) (M62.81);Other abnormalities of gait and mobility (R26.89);History of falling (Z91.81)     Time: 1400-1415 PT Time Calculation (min) (ACUTE ONLY): 15 min  Charges:  $Gait Training: 8-22 mins                     Rica Koyanagi  PTA [place] Office M-F          [phone] Weekend pager [phone]

## 2023-02-11 NOTE — Progress Notes (Signed)
PROGRESS NOTE  Nathaniel Warren  [id number] DOB: 1966-09-27 DOA: 01/22/2023 PCP: [name], NP   Brief Narrative: Patient is a 57 year old male with history of alcohol abuse/cirrhosis of liver, depression, chronic right ankle pain, suicidal attempts presented to the emergency department with complaint of left groin swelling, right arm swelling.  Found to have hematoma of the left groin as well as swelling of the right hand consistent with hematoma.  CT hand showed diffuse swelling without any abscess.    Hand surgery  consulted,recommended conservative management.   Hospital course remarkable for persistent pain and swelling of the right hand,finding of necrotic left 4th finger tip s/p  left ring finger I and D and left dorsal hand abscess I&D.  PT/OT recommended SNF on discharge.  Waiting for placement.  Assessment & Plan:  Principal Problem:   Spontaneous hematoma of lower leg Active Problems:   Alcoholic cirrhosis of liver with ascites (HCC)   MDD (major depressive disorder), recurrent severe, without psychosis ([place])   Hyponatremia   Tobacco abuse   Suicidal ideations  Right /left hand hematoma/necrotic left 4th finger tip: Hand surgery was consulted and was following.   No indication for surgery for right forearm hematoma .  Given few doses of vitamin K, FFP 2 units here. Follow-up CT scan of the right hand done on 2/25 without evidence of abscess or collection. He was also found to have worsening of the ulcer on the left fourth fingertip, appeared  necrotic, foul-smelling.  MRI of the finger didnot  not show any abscess or osteomyelitis. Also developed left hand swelling.s/p  left ring finger I and D and left dorsal hand abscess I&D. Cultures on 2/27 showed corynebacterium and Prevotella.  Initially started on vancomycin and zosyn , changed to Augmentin then transitioned to oritavancin x 1 on 3/3, plan for 1 week of Flagyl.  ID  was following. Right hand hematoma has completely  resolved.  Left hand wound has dressing,orthopedics recommends daily dressing change.recommend follow up with Dr Greta Doom in 2 weeks.He still has mild leucocytosis   Chronic pain syndrome: Has history of L5-S1 nerve compression with sciatica, chronic knee pain with foot drop.  Continue pain management, supportive care.    Spermatocele: Recommend ultrasound in 3 months   History of alcohol abuse/liver cirrhosis: Quit in 2023.  Elevated INR.  Given few days of vitamin K.  Has mild elevated liver enzymes along with bilirubin. Recommend to follow-up with GI/hepatology as an outpatient.Follows with [facility].  Has jaundice. On spironolactone Lasix at home.  Appears euvolemic   Macrocytic anemia/thrombocytopenia: This is secondary to chronic alcohol abuse, cirrhosis. continue thiamine /folic acid.  Hemoglobin stable.  Normal folate, vitamin B12 high   Nausea, vomiting: Had nausea with vomiting on 2/21.  Resolved after having a big bowel movement  Hyperlipidemia: LDL of 155.  Not kept on statin due to liver cirrhosis   Suicidal attempt/PTSD: Psychiatry following.  On paroxetine, quetiapine.  No longer requires IVC.            Pressure Injury 02/09/23 Buttocks Left Stage 2 -  Partial thickness loss of dermis presenting as a shallow open injury with a red, pink wound bed without slough. (Active)  02/09/23 1710  Location: Buttocks  Location Orientation: Left  Staging: Stage 2 -  Partial thickness loss of dermis presenting as a shallow open injury with a red, pink wound bed without slough.  Wound Description (Comments):   Present on Admission: No  Dressing Type Foam -  Lift dressing to assess site every shift 02/10/23 0925    DVT prophylaxis:SCDs Start: 01/23/23 0025     Code Status: Full Code  Family Communication: None at the bedside  Patient status: Inpatient  Patient is from :Home  Anticipated discharge to:SNF  Estimated DC date:Not sure   Consultants: Hand  surgery,ID  Procedures:I and D  Antimicrobials:  Anti-infectives (From admission, onward)    Start     Dose/Rate Route Frequency Ordered Stop   02/08/23 1100  Oritavancin Diphosphate (ORBACTIV) 1,200 mg in dextrose 5 % IVPB        1,200 mg 333.3 mL/hr over 180 Minutes Intravenous Once 02/08/23 0935 02/08/23 1425   02/08/23 1030  metroNIDAZOLE (FLAGYL) tablet 500 mg        500 mg Oral Every 12 hours 02/08/23 0935 02/15/23 0959   02/07/23 1745  amoxicillin-clavulanate (AUGMENTIN) 875-125 MG per tablet 1 tablet  Status:  Discontinued        1 tablet Oral Every 12 hours 02/07/23 1659 02/08/23 0935   02/06/23 2200  cefadroxil (DURICEF) capsule 500 mg  Status:  Discontinued        500 mg Oral 2 times daily 02/06/23 0939 02/06/23 0950   02/06/23 2200  cefadroxil (DURICEF) capsule 1,000 mg  Status:  Discontinued        1,000 mg Oral 2 times daily 02/06/23 0950 02/07/23 1652   02/02/23 0900  cefTRIAXone (ROCEPHIN) 2 g in sodium chloride 0.9 % 100 mL IVPB  Status:  Discontinued        2 g 200 mL/hr over 30 Minutes Intravenous Every 24 hours 02/01/23 1044 02/06/23 0938   02/01/23 1200  cefTRIAXone (ROCEPHIN) 1 g in sodium chloride 0.9 % 100 mL IVPB  Status:  Discontinued        1 g 200 mL/hr over 30 Minutes Intravenous  Once 02/01/23 1101 02/01/23 1150   01/31/23 2200  vancomycin (VANCOCIN) IVPB 1000 mg/200 mL premix  Status:  Discontinued        1,000 mg 200 mL/hr over 60 Minutes Intravenous Every 12 hours 01/31/23 0840 02/06/23 0938   01/31/23 0900  cefTRIAXone (ROCEPHIN) 1 g in sodium chloride 0.9 % 100 mL IVPB  Status:  Discontinued        1 g 200 mL/hr over 30 Minutes Intravenous Every 24 hours 01/31/23 0811 02/01/23 1044   01/31/23 0900  vancomycin (VANCOREADY) IVPB 1500 mg/300 mL        1,500 mg 150 mL/hr over 120 Minutes Intravenous  Once 01/31/23 0840 01/31/23 1336       Subjective: Patient seen and examined the bedside today.  Hemodynamically stable.  Eating his lunch.  Very  comfortable.  Right hand hematoma has completely resolved.  He has a dressing on the left hand and he still complains of pain.  He has a dressing on the left ring finger as well.  No other new complaints  Objective: Vitals:   02/10/23 1956 02/11/23 0440 02/11/23 0950 02/11/23 1313  BP: (!) 112/56 (!) 118/54 (!) 119/53 (!) 109/50  Pulse: 62 60 (!) 56 74  Resp: 18 18    Temp: 98.6 F (37 C) 98.4 F (36.9 C)    TempSrc:      SpO2: 99% 99%  96%  Weight:      Height:        Intake/Output Summary (Last 24 hours) at 02/11/2023 1319 Last data filed at 02/11/2023 1225 Gross per 24 hour  Intake 241 ml  Output 1100  ml  Net -859 ml   Filed Weights   01/23/23 2007  Weight: 77.7 kg    Examination:  General exam: Overall comfortable, not in distress, weak, deconditioned HEENT: PERRL Respiratory system:  no wheezes or crackles  Cardiovascular system: S1 & S2 heard, RRR.  Gastrointestinal system: Abdomen is nondistended, soft and nontender. Central nervous system: Alert and oriented Extremities: Dressing on the left hand, dressing on the left ring finger tip Skin: No rashes, no ulcers,icteric   Data Reviewed: I have personally reviewed following labs and imaging studies  CBC: Recent Labs  Lab 02/07/23 0405 02/08/23 0743 02/08/23 1454 02/10/23 0407 02/11/23 0418  WBC 17.0* 13.2* 14.6* 13.3* 13.1*  HGB 8.7* 9.0* 8.9* 8.4* 8.7*  HCT 25.3* 26.5* 25.9* 24.9* 25.0*  MCV 105.4* 106.9* 106.6* 108.7* 106.8*  PLT 109* 117* 118* 111* 0000000*   Basic Metabolic Panel: Recent Labs  Lab 02/06/23 0442 02/07/23 0405 02/08/23 0743 02/10/23 0407 02/11/23 0418  NA 131* 136 136 135 133*  K 4.1 4.2 4.2 4.2 4.1  CL 101 102 104 103 103  CO2 '24 26 26 26 25  '$ GLUCOSE 111* 99 114* 97 104*  BUN 29* 33* 31* 24* 21*  CREATININE 1.12 1.24 1.23 1.13 1.10  CALCIUM 8.3* 8.5* 8.7* 8.3* 8.2*     Recent Results (from the past 240 hour(s))  Fungus Culture With Stain     Status: None (Preliminary  result)   Collection Time: 02/03/23  6:07 PM   Specimen: PATH Other; Body Fluid  Result Value Ref Range Status   Fungus Stain Final report  Final    Comment: (NOTE) Performed At: [facility] [place], [place] [id number] Rush Farmer MD [id number]    Fungus (Mycology) Culture PENDING  Incomplete   Fungal Source WOUND  Final    Comment: L RING FINGER Performed at [place] [address], [place], [place] [address]   Aerobic/Anaerobic Culture w Gram Stain (surgical/deep wound)     Status: None   Collection Time: 02/03/23  6:07 PM   Specimen: PATH Other; Body Fluid  Result Value Ref Range Status   Specimen Description   Final    WOUND L RING FINGER Performed at [facility] [address] [place], [place] [address]    Special Requests   Final    NONE Performed at [facility], [place] [address], [place], [place] [address]    Gram Stain   Final    RARE WBC PRESENT, PREDOMINANTLY PMN FEW GRAM POSITIVE COCCI FEW GRAM NEGATIVE RODS RARE GRAM POSITIVE RODS    Culture   Final    ABUNDANT CORYNEBACTERIUM AMYCOLATUM Standardized susceptibility testing for this organism is not available. NO GROUP A STREP (S.PYOGENES) ISOLATED NO STAPHYLOCOCCUS AUREUS ISOLATED MODERATE PREVOTELLA MELANINOGENICA BETA LACTAMASE POSITIVE Performed at [facility], [place] [address] [place], [place] [address]    Report Status 02/07/2023 FINAL  Final  Acid Fast Smear (AFB)     Status: None   Collection Time: 02/03/23  6:07 PM   Specimen: PATH Other; Body Fluid  Result Value Ref Range Status   AFB Specimen Processing Concentration  Final   Acid Fast Smear Negative  Final    Comment: (NOTE) Performed At: [facility] [place], [place] [id number] Rush Farmer MD [id number]    Source (AFB) WOUND  Final    Comment: L RING FINGER Performed at [place]  [address]  [place], [place] [address]   Fungus Culture Result     Status: None   Collection Time: 02/03/23  6:07 PM  Result Value Ref Range Status   Result 1 Comment  Final    Comment: (NOTE) KOH/Calcofluor preparation:  no fungus observed. Performed At: [facility] [place], [place] [id number] Rush Farmer MD [id number]   Fungus Culture With Stain     Status: None (Preliminary result)   Collection Time: 02/03/23  6:25 PM   Specimen: PATH Other; Body Fluid  Result Value Ref Range Status   Fungus Stain Final report  Final    Comment: (NOTE) Performed At: [facility] [place], [place] [id number] Rush Farmer MD [id number]    Fungus (Mycology) Culture PENDING  Incomplete   Fungal Source WOUND  Final    Comment: LH Performed at [facility] [address], [place], [place] [address]   Aerobic/Anaerobic Culture w Gram Stain (surgical/deep wound)     Status: None   Collection Time: 02/03/23  6:25 PM   Specimen: PATH Other; Body Fluid  Result Value Ref Range Status   Specimen Description   Final    WOUND LH Performed at [place] [address] [place], [place] [address]    Special Requests   Final    NONE Performed at [facility], [place] [address] [place], [place] [address]    Gram Stain   Final    RARE WBC PRESENT, PREDOMINANTLY PMN NO ORGANISMS SEEN    Culture   Final    No growth aerobically or anaerobically. Performed at [facility], [place] [address], [place], [place] [address]    Report Status 02/09/2023 FINAL  Final  Acid Fast Smear (AFB)     Status: None   Collection Time: 02/03/23  6:25 PM   Specimen: PATH Other; Body Fluid  Result Value Ref Range Status   AFB Specimen Processing Concentration  Final   Acid Fast Smear Negative  Final    Comment: (NOTE) Performed At: [facility] [place], [place] [id number] Rush Farmer MD [id number]    Source (AFB) WOUND  Final    Comment: [facility] Performed at [place] [address], [place], [place] [address]   Fungus Culture Result     Status: None   Collection Time: 02/03/23  6:25 PM  Result Value Ref Range Status   Result 1 Comment  Final    Comment: (NOTE) KOH/Calcofluor preparation:  no fungus observed. Performed At: [facility] [place], [place] [id number] Rush Farmer MD [id number]      Radiology Studies: No results found.  Scheduled Meds:  folic acid  1 mg Oral Daily   furosemide  40 mg Oral Daily   hydrocerin   Topical BID   metroNIDAZOLE  500 mg Oral Q12H   PARoxetine  10 mg Oral Daily   polyethylene glycol  17 g Oral Daily   QUEtiapine  25 mg Oral QHS   sodium chloride flush  3 mL Intravenous Q12H   spironolactone  50 mg Oral Daily   thiamine  100 mg Oral Daily   Continuous Infusions:  sodium chloride       LOS: 19 days   Shelly Coss, MD [facility] P3/05/2023, 1:19 PM

## 2023-02-12 LAB — COMPREHENSIVE METABOLIC PANEL
ALT: 22 U/L (ref 0–44)
AST: 55 U/L — ABNORMAL HIGH (ref 15–41)
Albumin: 2.1 g/dL — ABNORMAL LOW (ref 3.5–5.0)
Alkaline Phosphatase: 141 U/L — ABNORMAL HIGH (ref 38–126)
Anion gap: 6 (ref 5–15)
BUN: 23 mg/dL — ABNORMAL HIGH (ref 6–20)
CO2: 26 mmol/L (ref 22–32)
Calcium: 8.1 mg/dL — ABNORMAL LOW (ref 8.9–10.3)
Chloride: 102 mmol/L (ref 98–111)
Creatinine, Ser: 1.18 mg/dL (ref 0.61–1.24)
GFR, Estimated: >60 mL/min (ref >60)
Glucose, Bld: 110 mg/dL — ABNORMAL HIGH (ref 70–99)
Potassium: 4.1 mmol/L (ref 3.5–5.1)
Sodium: 134 mmol/L — ABNORMAL LOW (ref 135–145)
Total Bilirubin: 6.9 mg/dL — ABNORMAL HIGH (ref 0.3–1.2)
Total Protein: 6.1 g/dL — ABNORMAL LOW (ref 6.5–8.1)

## 2023-02-12 LAB — CBC
HCT: 24.1 % — ABNORMAL LOW (ref 39.0–52.0)
Hemoglobin: 8.3 g/dL — ABNORMAL LOW (ref 13.0–17.0)
MCH: 37.4 pg — ABNORMAL HIGH (ref 26.0–34.0)
MCHC: 34.4 g/dL (ref 30.0–36.0)
MCV: 108.6 fL — ABNORMAL HIGH (ref 80.0–100.0)
Platelets: 118 10*3/uL — ABNORMAL LOW (ref 150–400)
RBC: 2.22 MIL/uL — ABNORMAL LOW (ref 4.22–5.81)
RDW: 19.4 % — ABNORMAL HIGH (ref 11.5–15.5)
WBC: 12.6 10*3/uL — ABNORMAL HIGH (ref 4.0–10.5)
nRBC: 0.2 % (ref 0.0–0.2)

## 2023-02-12 NOTE — TOC Progression Note (Signed)
Transition of Care [facility]) - Progression Note    Patient Details  Name: Nathaniel Warren MRN: [id number] Date of Birth: 26-Sep-1966  Transition of Care [facility]) CM/SW Bernie, LCSW Phone Number: 02/12/2023, 3:49 PM  Clinical Narrative:    CSW spoke with pt about recommendations for home health services. CSW informed pt about the barriers to arranging Home Health services. Pt is agreeable to home health if it can be arranged. CSW reached out to the home health agency that is providing charity this week. [facility] is not able to accept pt for home health services. Pt will need to follow up OP. Pt reports needing medication assistance as well at d/c .pt will need MATCH program at d/c.  Pt reports he will have the 3 dollar copay for each medication. TOC to follow.    Expected Discharge Plan: [facility] Barriers to Discharge: Continued Medical Work up  Expected Discharge Plan and Services In-house Referral: Clinical Social Work Discharge Planning Services: NA Post Acute Care Choice: NA Living arrangements for the past 2 months: Single Family Home                 DME Arranged: N/A DME Agency: NA                   Social Determinants of Health (SDOH) Interventions [place]: Food Insecurity Present (01/23/2023)  Housing: Medium Risk (01/23/2023)  Transportation Needs: Unmet Transportation Needs (01/23/2023)  Utilities: Not At Risk (01/23/2023)  Alcohol Screen: Medium Risk (10/27/2017)  Depression (PHQ2-9): High Risk (12/18/2022)  Financial Resource Strain: High Risk (07/15/2022)  Physical Activity: Inactive (07/15/2022)  Social Connections: Socially Isolated (07/15/2022)  Stress: Stress Concern Present (07/15/2022)  Tobacco Use: High Risk (02/04/2023)    Readmission Risk Interventions     No data to display

## 2023-02-12 NOTE — TOC Progression Note (Signed)
Transition of Care [facility]) - Progression Note    Patient Details  Name: Nathaniel Warren MRN: [id number] Date of Birth: [birth year]  Transition of Care [facility]) CM/SW Contact  Servando Snare, LCSW Phone Number: 02/12/2023, 12:32 PM  Clinical Narrative:   Patient report roommates state he cannot go home until Saturday. Patient reports roommates need to make room for him to be able to get around.     Expected Discharge Plan: [facility] Hospital Barriers to Discharge: Continued Medical Work up  Expected Discharge Plan and Services In-house Referral: Clinical Social Work Discharge Planning Services: NA Post Acute Care Choice: NA Living arrangements for the past 2 months: Single Family Home                 DME Arranged: N/A DME Agency: NA                   Social Determinants of Health (SDOH) Interventions [place]: Food Insecurity Present (01/23/2023)  Housing: Medium Risk (01/23/2023)  Transportation Needs: Unmet Transportation Needs (01/23/2023)  Utilities: Not At Risk (01/23/2023)  Alcohol Screen: Medium Risk (10/27/2017)  Depression (PHQ2-9): High Risk (12/18/2022)  Financial Resource Strain: High Risk (07/15/2022)  Physical Activity: Inactive (07/15/2022)  Social Connections: Socially Isolated (07/15/2022)  Stress: Stress Concern Present (07/15/2022)  Tobacco Use: High Risk (02/04/2023)    Readmission Risk Interventions     No data to display

## 2023-02-12 NOTE — Plan of Care (Signed)

## 2023-02-12 NOTE — Progress Notes (Signed)
PROGRESS NOTE  Nathaniel Warren  [id number] DOB: 1965/12/30 DOA: 01/22/2023 PCP: [name], NP   Brief Narrative: Patient is a 57 year old male with history of alcohol abuse/cirrhosis of liver, depression, chronic right ankle pain, suicidal attempts presented to the emergency department with complaint of left groin swelling, right arm swelling.  Found to have hematoma of the left groin as well as swelling of the right hand consistent with hematoma.  CT hand showed diffuse swelling without any abscess.    Hand surgery  consulted,recommended conservative management.   Hospital course remarkable for persistent pain and swelling of the right hand,finding of necrotic left 4th finger tip s/p  left ring finger I and D and left dorsal hand abscess I&D.  PT/OT recommended SNF earlier now changed the recommendation to outpatient PT. patient states he is not ready for discharge today and will wait till Saturday so that his friends  can make arrangements at home.  TOC following  Assessment & Plan:  Principal Problem:   Spontaneous hematoma of lower leg Active Problems:   Alcoholic cirrhosis of liver with ascites (HCC)   MDD (major depressive disorder), recurrent severe, without psychosis ([place])   Hyponatremia   Tobacco abuse   Suicidal ideations  Right /left hand hematoma/necrotic left 4th finger tip: Hand surgery was consulted and was following.   No indication for surgery for right forearm hematoma .  Given few doses of vitamin K, FFP 2 units here. Follow-up CT scan of the right hand done on 2/25 without evidence of abscess or collection. He was also found to have worsening of the ulcer on the left fourth fingertip, appeared  necrotic, foul-smelling.  MRI of the finger didnot  not show any abscess or osteomyelitis. Also developed left hand swelling.s/p  left ring finger I and D and left dorsal hand abscess I&D. Cultures on 2/27 showed corynebacterium and Prevotella.  Initially started on  vancomycin and zosyn , changed to Augmentin then transitioned to oritavancin x 1 on 3/3, plan for 1 week of Flagyl.  ID  was following. Right hand hematoma has completely resolved.  Left hand wound has dressing,orthopedics recommends daily dressing change.recommend follow up with Dr Greta Doom in 2 weeks.He still has mild leucocytosis but improving   Chronic pain syndrome: Has history of L5-S1 nerve compression with sciatica, chronic knee pain with foot drop.  Continue pain management, supportive care.    Spermatocele: Recommend ultrasound in 3 months   History of alcohol abuse/liver cirrhosis: Quit in 2023.  Elevated INR.  Given few days of vitamin K.  Has mild elevated liver enzymes along with bilirubin. Recommend to follow-up with GI/hepatology as an outpatient.Follows with [facility].  Has jaundice. On spironolactone, Lasix at home.  Appears euvolemic   Macrocytic anemia/thrombocytopenia: This is secondary to chronic alcohol abuse, cirrhosis. continue thiamine /folic acid.  Hemoglobin stable.  Normal folate, vitamin B12 high   Nausea, vomiting: Had nausea with vomiting on 2/21.  Resolved after having a big bowel movement  Hyperlipidemia: LDL of 155.  Not kept on statin due to liver cirrhosis   Suicidal attempt/PTSD: Psychiatry was following.  On paroxetine, quetiapine.  No longer requires IVC. No need for inpatient psychiatric admission as per psychiatry           Pressure Injury 02/09/23 Buttocks Left Stage 2 -  Partial thickness loss of dermis presenting as a shallow open injury with a red, pink wound bed without slough. (Active)  02/09/23 1710  Location: Buttocks  Location Orientation: Left  Staging: Stage 2 -  Partial thickness loss of dermis presenting as a shallow open injury with a red, pink wound bed without slough.  Wound Description (Comments):   Present on Admission: No  Dressing Type Foam - Lift dressing to assess site every shift 02/12/23 0950    DVT prophylaxis:SCDs  Start: 01/23/23 0025     Code Status: Full Code  Family Communication: None at the bedside  Patient status: Inpatient  Patient is from :Home  Anticipated discharge [id number]   Estimated DC date:Saturday   Consultants: Hand surgery,ID  Procedures:I and D  Antimicrobials:  Anti-infectives (From admission, onward)    Start     Dose/Rate Route Frequency Ordered Stop   02/08/23 1100  Oritavancin Diphosphate (ORBACTIV) 1,200 mg in dextrose 5 % IVPB        1,200 mg 333.3 mL/hr over 180 Minutes Intravenous Once 02/08/23 0935 02/08/23 1425   02/08/23 1030  metroNIDAZOLE (FLAGYL) tablet 500 mg        500 mg Oral Every 12 hours 02/08/23 0935 02/15/23 0959   02/07/23 1745  amoxicillin-clavulanate (AUGMENTIN) 875-125 MG per tablet 1 tablet  Status:  Discontinued        1 tablet Oral Every 12 hours 02/07/23 1659 02/08/23 0935   02/06/23 2200  cefadroxil (DURICEF) capsule 500 mg  Status:  Discontinued        500 mg Oral 2 times daily 02/06/23 0939 02/06/23 0950   02/06/23 2200  cefadroxil (DURICEF) capsule 1,000 mg  Status:  Discontinued        1,000 mg Oral 2 times daily 02/06/23 0950 02/07/23 1652   02/02/23 0900  cefTRIAXone (ROCEPHIN) 2 g in sodium chloride 0.9 % 100 mL IVPB  Status:  Discontinued        2 g 200 mL/hr over 30 Minutes Intravenous Every 24 hours 02/01/23 1044 02/06/23 0938   02/01/23 1200  cefTRIAXone (ROCEPHIN) 1 g in sodium chloride 0.9 % 100 mL IVPB  Status:  Discontinued        1 g 200 mL/hr over 30 Minutes Intravenous  Once 02/01/23 1101 02/01/23 1150   01/31/23 2200  vancomycin (VANCOCIN) IVPB 1000 mg/200 mL premix  Status:  Discontinued        1,000 mg 200 mL/hr over 60 Minutes Intravenous Every 12 hours 01/31/23 0840 02/06/23 0938   01/31/23 0900  cefTRIAXone (ROCEPHIN) 1 g in sodium chloride 0.9 % 100 mL IVPB  Status:  Discontinued        1 g 200 mL/hr over 30 Minutes Intravenous Every 24 hours 01/31/23 0811 02/01/23 1044   01/31/23 0900  vancomycin  (VANCOREADY) IVPB 1500 mg/300 mL        1,500 mg 150 mL/hr over 120 Minutes Intravenous  Once 01/31/23 0840 01/31/23 1336       Subjective: Patient seen and examined at bedside today.  Hemodynamically stable.  Comfortable.  Alert and oriented.  We discussed about the PT assessment yesterday and recommendation has been changed to home.  He says he is completely unprepared to go home today.  He lives with his friends in the house and they need to make arrangements like cleaning and the earliest  he can go to home is on Saturday.  TOC notified  Objective: Vitals:   02/11/23 0950 02/11/23 1313 02/11/23 1949 02/12/23 0554  BP: (!) 119/53 (!) 109/50 (!) 102/51 (!) 111/52  Pulse: (!) 56 74 62 (!) 55  Resp:  '16 18 18  '$ Temp:  98.9 F (37.2  C) 98.7 F (37.1 C) 98 F (36.7 C)  TempSrc:  Oral    SpO2:  96% 99% 99%  Weight:      Height:        Intake/Output Summary (Last 24 hours) at 02/12/2023 1145 Last data filed at 02/12/2023 0300 Gross per 24 hour  Intake --  Output 775 ml  Net -775 ml   Filed Weights   01/23/23 2007  Weight: 77.7 kg    Examination:  General exam: Overall comfortable, not in distress,appears deconditioned HEENT: PERRL Respiratory system:  no wheezes or crackles  Cardiovascular system: S1 & S2 heard, RRR.  Gastrointestinal system: Abdomen is nondistended, soft and nontender. Central nervous system: Alert and oriented Extremities: Dressing on the left hand and left ring finger Skin: icterus     Data Reviewed: I have personally reviewed following labs and imaging studies  CBC: Recent Labs  Lab 02/08/23 0743 02/08/23 1454 02/10/23 0407 02/11/23 0418 02/12/23 0422  WBC 13.2* 14.6* 13.3* 13.1* 12.6*  HGB 9.0* 8.9* 8.4* 8.7* 8.3*  HCT 26.5* 25.9* 24.9* 25.0* 24.1*  MCV 106.9* 106.6* 108.7* 106.8* 108.6*  PLT 117* 118* 111* 109* [id number]*   Basic Metabolic Panel: Recent Labs  Lab 02/07/23 0405 02/08/23 0743 02/10/23 0407 02/11/23 0418 02/12/23 0422  NA  136 136 135 133* 134*  K 4.2 4.2 4.2 4.1 4.1  CL 102 104 103 103 102  CO2 '26 26 26 25 26  '$ GLUCOSE 99 114* 97 104* 110*  BUN 33* 31* 24* 21* 23*  CREATININE 1.24 1.23 1.13 1.10 1.18  CALCIUM 8.5* 8.7* 8.3* 8.2* 8.1*     Recent Results (from the past 240 hour(s))  Fungus Culture With Stain     Status: None (Preliminary result)   Collection Time: 02/03/23  6:07 PM   Specimen: PATH Other; Body Fluid  Result Value Ref Range Status   Fungus Stain Final report  Final    Comment: (NOTE) Performed At: [facility] [place], [place] [id number] Rush Farmer MD [id number]    Fungus (Mycology) Culture PENDING  Incomplete   Fungal Source WOUND  Final    Comment: L RING FINGER Performed at [facility] [address] [place], [place] [address]   Aerobic/Anaerobic Culture w Gram Stain (surgical/deep wound)     Status: None   Collection Time: 02/03/23  6:07 PM   Specimen: PATH Other; Body Fluid  Result Value Ref Range Status   Specimen Description   Final    WOUND L RING FINGER Performed at [facility] [address], [place], [place] [address]    Special Requests   Final    NONE Performed at [facility], [place] [address] [place], [place] [address]    Gram Stain   Final    RARE WBC PRESENT, PREDOMINANTLY PMN FEW GRAM POSITIVE COCCI FEW GRAM NEGATIVE RODS RARE GRAM POSITIVE RODS    Culture   Final    ABUNDANT CORYNEBACTERIUM AMYCOLATUM Standardized susceptibility testing for this organism is not available. NO GROUP A STREP (S.PYOGENES) ISOLATED NO STAPHYLOCOCCUS AUREUS ISOLATED MODERATE PREVOTELLA MELANINOGENICA BETA LACTAMASE POSITIVE Performed at [facility], [place] [address], [place], [place] [address]    Report Status 02/07/2023 FINAL  Final  Acid Fast Smear (AFB)     Status: None   Collection Time: 02/03/23  6:07 PM   Specimen: PATH Other; Body Fluid  Result Value Ref Range Status    AFB Specimen Processing Concentration  Final   Acid Fast Smear Negative  Final    Comment: (NOTE) Performed At: [facility] [place], [place] [id number] Rush Farmer MD [id number]    Source (AFB) WOUND  Final    Comment: L RING FINGER Performed at [facility] [address] [place], [place] [address]   Fungus Culture Result     Status: None   Collection Time: 02/03/23  6:07 PM  Result Value Ref Range Status   Result 1 Comment  Final    Comment: (NOTE) KOH/Calcofluor preparation:  no fungus observed. Performed At: [facility] [place], [place] [id number] Rush Farmer MD [id number]   Fungus Culture With Stain     Status: None (Preliminary result)   Collection Time: 02/03/23  6:25 PM   Specimen: PATH Other; Body Fluid  Result Value Ref Range Status   Fungus Stain Final report  Final    Comment: (NOTE) Performed At: [facility] [place], [place] [id number] Rush Farmer MD [id number]    Fungus (Mycology) Culture PENDING  Incomplete   Fungal Source WOUND  Final    Comment: LH Performed at [facility] [address], [place], [place] [address]   Aerobic/Anaerobic Culture w Gram Stain (surgical/deep wound)     Status: None   Collection Time: 02/03/23  6:25 PM   Specimen: PATH Other; Body Fluid  Result Value Ref Range Status   Specimen Description   Final    WOUND LH Performed at [facility] [address], [place], [place] [address]    Special Requests   Final    NONE Performed at [facility], [place] [address] [place], [place] [address]    Gram Stain   Final    RARE WBC PRESENT, PREDOMINANTLY PMN NO ORGANISMS SEEN    Culture   Final    No growth aerobically or anaerobically. Performed at [facility], [place] [address] [place], [place] [address]    Report Status 02/09/2023 FINAL  Final  Acid Fast  Smear (AFB)     Status: None   Collection Time: 02/03/23  6:25 PM   Specimen: PATH Other; Body Fluid  Result Value Ref Range Status   AFB Specimen Processing Concentration  Final   Acid Fast Smear Negative  Final    Comment: (NOTE) Performed At: [facility] [place], [place] [id number] Rush Farmer MD [id number]    Source (AFB) WOUND  Final    Comment: [facility] Performed at [facility] [address] [place], [place] [address]   Fungus Culture Result     Status: None   Collection Time: 02/03/23  6:25 PM  Result Value Ref Range Status   Result 1 Comment  Final    Comment: (NOTE) KOH/Calcofluor preparation:  no fungus observed. Performed At: [facility] [place], [place] [id number] Rush Farmer MD [id number]      Radiology Studies: No results found.  Scheduled Meds:  folic acid  1 mg Oral Daily   furosemide  40 mg Oral Daily   hydrocerin   Topical BID   metroNIDAZOLE  500 mg Oral Q12H   PARoxetine  10 mg Oral Daily   polyethylene glycol  17 g Oral Daily   QUEtiapine  25 mg Oral QHS   sodium chloride flush  3 mL Intravenous Q12H   spironolactone  50 mg Oral Daily   thiamine  100 mg  Oral Daily   Continuous Infusions:  sodium chloride       LOS: 20 days   Shelly Coss, MD [facility] P3/06/2023, 11:45 AM

## 2023-02-13 LAB — CULTURE, BLOOD (ROUTINE X 2): Special Requests: ADEQUATE

## 2023-02-13 MED ORDER — FUROSEMIDE 10 MG/ML IJ SOLN
40.0000 mg | Freq: Once | INTRAMUSCULAR | Status: AC
  Administered 2023-02-13: 40 mg via INTRAVENOUS
  Filled 2023-02-13: qty 4

## 2023-02-13 NOTE — Progress Notes (Signed)
Physical Therapy Treatment Patient Details Name: Nathaniel Warren MRN: [id number] DOB: 08-31-1966 Today's Date: 02/13/2023   History of Present Illness 57 yo male with onset of groin and RT dorsal hand bruising and edema with reported light trauma during a fall.  I&D to LT D4 on 02/03/23. Has h/o R foot drop without AFO. + for suicidal ideations.   PMHx:  etoh abuse, depression with h/o 4 suicide attempts, stroke, seizures, liver failure, R ankle fracture with ORIF, atherosclerosis, cirrhosis, diverticulosis, osteopenia.    PT Comments    Pt in bed AxO x 3 pleasant and willing.  Pt looks "worse" from last PT visit.  Noted increased edema all extremities and jaundice color.  Unable to apply his shoe due to edema.  Assisted OOB to amb to bathroom was an effort and required increased time.  Extended time in bathroom for a BM.  "Loose, watery and yellow".  Pt was too fatigued to amb in hallway after so assisted back to bed. Reported BM to RN.   Recommendations for follow up therapy are one component of a multi-disciplinary discharge planning process, led by the attending physician.  Recommendations may be updated based on patient status, additional functional criteria and insurance authorization.  Follow Up Recommendations  Outpatient PT     Assistance Recommended at Discharge Intermittent Supervision/Assistance  Patient can return home with the following A little help with walking and/or transfers;Direct supervision/assist for medications management;Assistance with cooking/housework;Assist for transportation;A little help with bathing/dressing/bathroom;Help with stairs or ramp for entrance   Equipment Recommendations  Rolling walker (2 wheels);Other (comment) (Lt platform)    Recommendations for Other Services       Precautions / Restrictions Precautions Precautions: Fall Precaution Comments: R foot drop, does better with his shoes on (less ankle roll) Required Braces or Orthoses:  Splint/Cast Splint/Cast: LT ring finger Restrictions Weight Bearing Restrictions: No Other Position/Activity Restrictions: Limited LT hand WB since I&D to D4     Mobility  Bed Mobility Overal bed mobility: Modified Independent             General bed mobility comments: self able with increased time    Transfers Overall transfer level: Needs assistance Equipment used: Left platform walker Transfers: Sit to/from Stand Sit to Stand: Min guard, From elevated surface, Supervision           General transfer comment: Close Min guard for safety. Increased time. Cues provided for safety with limited use of left hand and swollen right hand.  Also assisted with a toilet transfer.    Ambulation/Gait Ambulation/Gait assistance: Supervision, Min guard Gait Distance (Feet): 24 Feet (12 feet x 2) Assistive device: Left platform walker, Rolling walker (2 wheels) Gait Pattern/deviations: Step-through pattern, Steppage, Narrow base of support Gait velocity: decreased     General Gait Details: decreased amb distance this session due to increased edema B LE (unable to apply shoes as done 2 days prior) and increased c/o "not feeling well" fatigue.  Amb to and from bathroom only this session vs hallway 2 days prior.   Stairs             Wheelchair Mobility    Modified Rankin (Stroke Patients Only)       Balance                                            Cognition Arousal/Alertness: Awake/alert Behavior  During Therapy: WFL for tasks assessed/performed Overall Cognitive Status: Within Functional Limits for tasks assessed                                 General Comments: Very pleasant, motivated        Exercises      General Comments        Pertinent Vitals/Pain Pain Assessment Pain Assessment: Faces Pain Location: Lt hand during activity Pain Descriptors / Indicators: Grimacing, Aching Pain Intervention(s): Monitored during  session, Repositioned    Home Living                          Prior Function            PT Goals (current goals can now be found in the care plan section) Progress towards PT goals: Progressing toward goals    Frequency    Min 3X/week      PT Plan Current plan remains appropriate    Co-evaluation              AM-PAC PT "6 Clicks" Mobility   Outcome Measure  Help needed turning from your back to your side while in a flat bed without using bedrails?: None Help needed moving from lying on your back to sitting on the side of a flat bed without using bedrails?: None Help needed moving to and from a bed to a chair (including a wheelchair)?: A Little Help needed standing up from a chair using your arms (e.g., wheelchair or bedside chair)?: A Little Help needed to walk in hospital room?: A Little Help needed climbing 3-5 steps with a railing? : A Lot 6 Click Score: 19    End of Session Equipment Utilized During Treatment: Gait belt Activity Tolerance: Patient limited by fatigue;Other (comment) (increased edema all extremities) Patient left: in bed;with call bell/phone within reach;with bed alarm set Nurse Communication: Mobility status PT Visit Diagnosis: Muscle weakness (generalized) (M62.81);Other abnormalities of gait and mobility (R26.89);History of falling (Z91.81)     Time: [id number] PT Time Calculation (min) (ACUTE ONLY): 40 min  Charges:  $Gait Training: 8-22 mins $Therapeutic Activity: 23-37 mins                     {Eydan Chianese  PTA Acute  [employer] M-F          [phone] Weekend pager [phone]

## 2023-02-13 NOTE — Progress Notes (Signed)
PROGRESS NOTE  Nathaniel Warren  [id number] DOB: Jan 24, 1966 DOA: 01/22/2023 PCP: [name], NP   Brief Narrative: Patient is a 57 year old male with history of alcohol abuse/cirrhosis of liver, depression, chronic right ankle pain, suicidal attempts presented to the emergency department with complaint of left groin swelling, right arm swelling.  Found to have hematoma of the left groin as well as swelling of the right hand consistent with hematoma.  CT hand showed diffuse swelling without any abscess.    Hand surgery  consulted,recommended conservative management.   Hospital course remarkable for persistent pain and swelling of the right hand,finding of necrotic left 4th finger tip s/p  left ring finger I and D and left dorsal hand abscess I&D.  PT/OT recommended SNF earlier now changed the recommendation to outpatient PT. patient states he is not ready for discharge today and will wait till Sunday so that his friends  can make arrangements at home.  TOC following  Assessment & Plan:  Principal Problem:   Spontaneous hematoma of lower leg Active Problems:   Alcoholic cirrhosis of liver with ascites (HCC)   MDD (major depressive disorder), recurrent severe, without psychosis ([place])   Hyponatremia   Tobacco abuse   Suicidal ideations  Right /left hand hematoma/necrotic left 4th finger tip: Hand surgery was consulted and was following.   No indication for surgery for right forearm hematoma .  Given few doses of vitamin K, FFP 2 units here. Follow-up CT scan of the right hand done on 2/25 without evidence of abscess or collection. He was also found to have worsening of the ulcer on the left fourth fingertip, appeared  necrotic, foul-smelling.  MRI of the finger didnot  not show any abscess or osteomyelitis. Also developed left hand swelling.s/p  left ring finger I and D and left dorsal hand abscess I&D. Cultures on 2/27 showed corynebacterium and Prevotella.  Initially started on vancomycin  and zosyn , changed to Augmentin then transitioned to oritavancin x 1 on 3/3, plan for 1 week of Flagyl.  ID  was following. Right hand hematoma has completely resolved.  Left hand wound has dressing,orthopedics recommends daily dressing change.recommend follow up with Dr Greta Doom in 2 weeks.He still has mild leucocytosis but improving   Chronic pain syndrome: Has history of L5-S1 nerve compression with sciatica, chronic knee pain with foot drop.  Continue pain management, supportive care.    Spermatocele: Recommend ultrasound in 3 months   History of alcohol abuse/liver cirrhosis: Quit in 2023.  Elevated INR.  Given few days of vitamin K.  Has mild elevated liver enzymes along with bilirubin. Recommend to follow-up with GI/hepatology as an outpatient.Follows with [facility].  Has jaundice. On spironolactone, Lasix at home.  Appears to have worsening edema of the lower extremities.  Will give a dose of IV Lasix today.   Macrocytic anemia/thrombocytopenia: This is secondary to chronic alcohol abuse, cirrhosis. continue thiamine /folic acid.  Hemoglobin stable.  Normal folate, vitamin B12 high   Nausea, vomiting: Had nausea with vomiting on 2/21.  Resolved after having a big bowel movement  Hyperlipidemia: LDL of 155.  Not kept on statin due to liver cirrhosis   Suicidal attempt/PTSD: Psychiatry was following.  On paroxetine, quetiapine.  No longer requires IVC. No need for inpatient psychiatric admission as per psychiatry           Pressure Injury 02/09/23 Buttocks Left Stage 2 -  Partial thickness loss of dermis presenting as a shallow open injury with a red,  pink wound bed without slough. (Active)  02/09/23 1710  Location: Buttocks  Location Orientation: Left  Staging: Stage 2 -  Partial thickness loss of dermis presenting as a shallow open injury with a red, pink wound bed without slough.  Wound Description (Comments):   Present on Admission: No  Dressing Type Foam - Lift dressing to  assess site every shift 02/12/23 2140    DVT prophylaxis:SCDs Start: 01/23/23 0025     Code Status: Full Code  Family Communication: None at the bedside  Patient status: Inpatient  Patient is from :Home  Anticipated discharge [id number]   Estimated DC date:'Sunday   Consultants: Hand surgery,ID  Procedures:I and D  Antimicrobials:  Anti-infectives (From admission, onward)    Start     Dose/Rate Route Frequency Ordered Stop   02/08/23 1100  Oritavancin Diphosphate (ORBACTIV) 1,200 mg in dextrose 5 % IVPB        1,200 mg 333.3 mL/hr over 180 Minutes Intravenous Once 02/08/23 0935 02/08/23 1425   02/08/23 1030  metroNIDAZOLE (FLAGYL) tablet 500 mg        500 mg Oral Every 12 hours 02/08/23 0935 02/15/23 0959   02/07/23 1745  amoxicillin-clavulanate (AUGMENTIN) 875-125 MG per tablet 1 tablet  Status:  Discontinued        1 tablet Oral Every 12 hours 02/07/23 1659 02/08/23 0935   02/06/23 2200  cefadroxil (DURICEF) capsule 500 mg  Status:  Discontinued        50'$ 0 mg Oral 2 times daily 02/06/23 0939 02/06/23 0950   02/06/23 2200  cefadroxil (DURICEF) capsule 1,000 mg  Status:  Discontinued        1,000 mg Oral 2 times daily 02/06/23 0950 02/07/23 1652   02/02/23 0900  cefTRIAXone (ROCEPHIN) 2 g in sodium chloride 0.9 % 100 mL IVPB  Status:  Discontinued        2 g 200 mL/hr over 30 Minutes Intravenous Every 24 hours 02/01/23 1044 02/06/23 0938   02/01/23 1200  cefTRIAXone (ROCEPHIN) 1 g in sodium chloride 0.9 % 100 mL IVPB  Status:  Discontinued        1 g 200 mL/hr over 30 Minutes Intravenous  Once 02/01/23 1101 02/01/23 1150   01/31/23 2200  vancomycin (VANCOCIN) IVPB 1000 mg/200 mL premix  Status:  Discontinued        1,000 mg 200 mL/hr over 60 Minutes Intravenous Every 12 hours 01/31/23 0840 02/06/23 0938   01/31/23 0900  cefTRIAXone (ROCEPHIN) 1 g in sodium chloride 0.9 % 100 mL IVPB  Status:  Discontinued        1 g 200 mL/hr over 30 Minutes Intravenous Every 24 hours  01/31/23 0811 02/01/23 1044   01/31/23 0900  vancomycin (VANCOREADY) IVPB 1500 mg/300 mL        1,500 mg 150 mL/hr over 120 Minutes Intravenous  Once 01/31/23 0840 01/31/23 1336       Subjective: Patient seen and examined at bedside today.  Hemodynamically stable without any new questions.  Eating his food.  He says he cannot go on Saturday because his friends will not be at home and the discharge is only possible on Sunday  Objective: Vitals:   02/11/23 1949 02/12/23 0554 02/12/23 1416 02/13/23 0435  BP: (!) 102/51 (!) 111/52 (!) 118/57 121/61  Pulse: 62 (!) 55 62 60  Resp: '18 18 18 19  '$ Temp: 98.7 F (37.1 C) 98 F (36.7 C) 98.4 F (36.9 C) 98.1 F (36.7 C)  TempSrc:  Oral  SpO2: 99% 99% 99% 96%  Weight:      Height:        Intake/Output Summary (Last 24 hours) at 02/13/2023 1126 Last data filed at 02/12/2023 1925 Gross per 24 hour  Intake --  Output 525 ml  Net -525 ml   Filed Weights   01/23/23 2007  Weight: 77.7 kg    Examination:   General exam: Overall comfortable, not in distress HEENT: PERRL Respiratory system:  no wheezes or crackles  Cardiovascular system: S1 & S2 heard, RRR.  Gastrointestinal system: Abdomen is distended, soft and nontender. Central nervous system: Alert and oriented Extremities: Dressing on the left hand and left ring finger, edema of lower extremities Skin: Icterus   Data Reviewed: I have personally reviewed following labs and imaging studies  CBC: Recent Labs  Lab 02/08/23 0743 02/08/23 1454 02/10/23 0407 02/11/23 0418 02/12/23 0422  WBC 13.2* 14.6* 13.3* 13.1* 12.6*  HGB 9.0* 8.9* 8.4* 8.7* 8.3*  HCT 26.5* 25.9* 24.9* 25.0* 24.1*  MCV 106.9* 106.6* 108.7* 106.8* 108.6*  PLT 117* 118* 111* 109* [id number]*   Basic Metabolic Panel: Recent Labs  Lab 02/07/23 0405 02/08/23 0743 02/10/23 0407 02/11/23 0418 02/12/23 0422  NA 136 136 135 133* 134*  K 4.2 4.2 4.2 4.1 4.1  CL 102 104 103 103 102  CO2 '26 26 26 25 26   '$ GLUCOSE 99 114* 97 104* 110*  BUN 33* 31* 24* 21* 23*  CREATININE 1.24 1.23 1.13 1.10 1.18  CALCIUM 8.5* 8.7* 8.3* 8.2* 8.1*     Recent Results (from the past 240 hour(s))  Fungus Culture With Stain     Status: None (Preliminary result)   Collection Time: 02/03/23  6:07 PM   Specimen: PATH Other; Body Fluid  Result Value Ref Range Status   Fungus Stain Final report  Final    Comment: (NOTE) Performed At: [facility] [place], [place] [id number] Rush Farmer MD [id number]    Fungus (Mycology) Culture PENDING  Incomplete   Fungal Source WOUND  Final    Comment: L RING FINGER Performed at [facility] [address] [place], [place] [address]   Aerobic/Anaerobic Culture w Gram Stain (surgical/deep wound)     Status: None   Collection Time: 02/03/23  6:07 PM   Specimen: PATH Other; Body Fluid  Result Value Ref Range Status   Specimen Description   Final    WOUND L RING FINGER Performed at [facility] [address], [place], [place] [address]    Special Requests   Final    NONE Performed at [facility], [place] [address] [place], [place] [address]    Gram Stain   Final    RARE WBC PRESENT, PREDOMINANTLY PMN FEW GRAM POSITIVE COCCI FEW GRAM NEGATIVE RODS RARE GRAM POSITIVE RODS    Culture   Final    ABUNDANT CORYNEBACTERIUM AMYCOLATUM Standardized susceptibility testing for this organism is not available. NO GROUP A STREP (S.PYOGENES) ISOLATED NO STAPHYLOCOCCUS AUREUS ISOLATED MODERATE PREVOTELLA MELANINOGENICA BETA LACTAMASE POSITIVE Performed at [facility], [place] [address], [place], [place] [address]    Report Status 02/07/2023 FINAL  Final  Acid Fast Smear (AFB)     Status: None   Collection Time: 02/03/23  6:07 PM   Specimen: PATH Other; Body Fluid  Result Value Ref Range Status   AFB Specimen Processing Concentration  Final   Acid Fast Smear Negative  Final  Comment: (NOTE) Performed At: [facility] [place], [place] [id number] Rush Farmer MD [id number]    Source (AFB) WOUND  Final    Comment: L RING FINGER Performed at [facility] [address], [place], [place] [address]   Fungus Culture Result     Status: None   Collection Time: 02/03/23  6:07 PM  Result Value Ref Range Status   Result 1 Comment  Final    Comment: (NOTE) KOH/Calcofluor preparation:  no fungus observed. Performed At: [facility] [place], [place] [id number] Rush Farmer MD [id number]   Fungus Culture With Stain     Status: None (Preliminary result)   Collection Time: 02/03/23  6:25 PM   Specimen: PATH Other; Body Fluid  Result Value Ref Range Status   Fungus Stain Final report  Final    Comment: (NOTE) Performed At: [facility] [place], [place] [id number] Rush Farmer MD [id number]    Fungus (Mycology) Culture PENDING  Incomplete   Fungal Source WOUND  Final    Comment: LH Performed at [facility] [address] [place], [place] [address]   Aerobic/Anaerobic Culture w Gram Stain (surgical/deep wound)     Status: None   Collection Time: 02/03/23  6:25 PM   Specimen: PATH Other; Body Fluid  Result Value Ref Range Status   Specimen Description   Final    WOUND LH Performed at [facility] [address], [place], [place] [address]    Special Requests   Final    NONE Performed at [facility], [place] [address], [place], [place] [address]    Gram Stain   Final    RARE WBC PRESENT, PREDOMINANTLY PMN NO ORGANISMS SEEN    Culture   Final    No growth aerobically or anaerobically. Performed at [facility], [place] [address], [place], [place] [address]    Report Status 02/09/2023 FINAL  Final  Acid Fast Smear (AFB)     Status: None   Collection Time: 02/03/23  6:25 PM   Specimen: PATH  Other; Body Fluid  Result Value Ref Range Status   AFB Specimen Processing Concentration  Final   Acid Fast Smear Negative  Final    Comment: (NOTE) Performed At: [facility] [place], [place] [id number] Rush Farmer MD [id number]    Source (AFB) WOUND  Final    Comment: [facility] Performed at [facility] [address], [place],  [address]   Fungus Culture Result     Status: None   Collection Time: 02/03/23  6:25 PM  Result Value Ref Range Status   Result 1 Comment  Final    Comment: (NOTE) KOH/Calcofluor preparation:  no fungus observed. Performed At: [facility] [place], [place] [id number] Rush Farmer MD [id number]      Radiology Studies: No results found.  Scheduled Meds:  folic acid  1 mg Oral Daily   furosemide  40 mg Oral Daily   hydrocerin   Topical BID   metroNIDAZOLE  500 mg Oral Q12H   PARoxetine  10 mg Oral Daily   polyethylene glycol  17 g Oral Daily   QUEtiapine  25 mg Oral QHS   sodium chloride flush  3 mL Intravenous Q12H   spironolactone  50 mg Oral Daily   thiamine  100 mg Oral Daily   Continuous Infusions:  sodium chloride  LOS: 21 days   Shelly Coss, MD [facility] P3/07/2023, 11:26 AM

## 2023-02-13 NOTE — Progress Notes (Signed)
Occupational Therapy Treatment Patient Details Name: Nathaniel Warren MRN: [id number] DOB: Oct 30, 1966 Today's Date: 02/13/2023   History of present illness 57 yo male with onset of groin and RT dorsal hand bruising and edema with reported light trauma during a fall.  I&D to LT D4 on 02/03/23. Has h/o R foot drop without AFO. + for suicidal ideations.   PMHx:  etoh abuse, depression with h/o 4 suicide attempts, stroke, seizures, liver failure, R ankle fracture with ORIF, atherosclerosis, cirrhosis, diverticulosis, osteopenia.   OT comments  Pt continues to show gains with his RT hand function, now with positive composite grip and tolerated retrograde massage to RT digits and dorsal hand well. Pt with increased pain to LT hand during retrograde massage and passive stretches and was unable to fully complete his exercise program today, but continues to show small gains including a decreased distance of LT D3 to [facility] from 5.5 cm last visit to 5.0 cm this date.  Pt continues to demonstrate good rehab potential and would benefit from working more with OT OOB on ADLs to prepare for discharge, but pt has refused this for the most part this this OT and OT sees that pt is working well on mobility with PT. Continued skilled OT to increase safety and independence with ADLs and functional transfers to allow pt to return home safely and reduce caregiver burden and fall risk is recommended.    Recommendations for follow up therapy are one component of a multi-disciplinary discharge planning process, led by the attending physician.  Recommendations may be updated based on patient status, additional functional criteria and insurance authorization.    Follow Up Recommendations  Outpatient OT     Assistance Recommended at Discharge Frequent or constant Supervision/Assistance  Patient can return home with the following  A little help with bathing/dressing/bathroom;A little help with walking and/or transfers;Assistance  with cooking/housework;Assist for transportation;Direct supervision/assist for medications management   Equipment Recommendations  None recommended by OT    Recommendations for Other Services      Precautions / Restrictions Precautions Precautions: Fall Precaution Comments: R foot drop, does better with his shoes on (less ankle roll) Required Braces or Orthoses: Splint/Cast Splint/Cast: LT ring finger Restrictions Weight Bearing Restrictions: No       Mobility Bed Mobility  Pt declined                  Transfers    Pt declined                     Balance                                           ADL either performed or assessed with clinical judgement   ADL   Eating/Feeding: Bed level;Modified independent Eating/Feeding Details (indicate cue type and reason): Pt able to reach for water with RT hand Mod I. Pt declined to get up to recliner for lunch.                                        Extremity/Trunk Assessment Upper Extremity Assessment RUE Deficits / Details: Now using RT hand functionally in all ADLs. Still with swelling and discoloration to dorsum of hand. Lots of peeling of skin on hand. LUE Deficits /  Details: Edema: LT D2: 7.5 cm at PIP. D3: 7.6cm at PIP.  D3 to [facility]: 5.0 cm decreased distance from 5.5cm last session.            Vision Baseline Vision/History: 1 Wears glasses Patient Visual Report: No change from baseline Vision Assessment?: No apparent visual deficits   Perception     Praxis      Cognition Arousal/Alertness: Awake/alert Behavior During Therapy: WFL for tasks assessed/performed Overall Cognitive Status: Within Functional Limits for tasks assessed                                 General Comments: Very pleasant, motivated, compliant with HEP        Exercises Other Exercises Other Exercises: Pt educated on LEFT hand exercises and performed 10 reps of finger  flex/ext, wrist flex/ext, wrist RD/UD, finger adduction/abduction, MP hyperextention, and 3 reps sup/pronation-terminated due to increasing pain. Pt shown edema mangement techniques to each arm and demonstrated understanding, continues to intiiate elevation on pillows now without cues. Pt performed LT hand exericese to tolerance after retrograde massage and passive gentle stretch to all LT hand joints. Pt completed 10 reps hand open and close and lateral add/abd, but in too much pain for MP extention and therefore OT demonstrated to reinforce. Pt also shown tendon and nerve gliding exercises to try at home. Ed on free [employer] for instruction. Other Exercises: Manual techniques to each finger to reduce edema and then passive ROM to digits on LT Other Exercises: Massage techniques to palm and retrograde massage of RT hand.    Shoulder Instructions       General Comments      Pertinent Vitals/ Pain       Pain Assessment Pain Assessment: Faces Faces Pain Scale: Hurts whole lot Pain Location: LT hand during activity, RT hand during gentle retrograde massage. Pain Descriptors / Indicators: Grimacing, Sore, Moaning Pain Intervention(s): Limited activity within patient's tolerance, Monitored during session  Home Living                                          Prior Functioning/Environment              Frequency  Min 2X/week        Progress Toward Goals  OT Goals(current goals can now be found in the care plan section)  Progress towards OT goals: Progressing toward goals (However pt not interested in OOB or ADLs with OT. Has been focused on RT aqnd now LT hand exercises and edema management only.)  Acute Rehab OT Goals Time For Goal Achievement: 02/20/23 Potential to Achieve Goals: Good  Plan Discharge plan needs to be updated    Co-evaluation                 AM-PAC OT "6 Clicks" Daily Activity     Outcome Measure   Help from another person  eating meals?: A Little Help from another person taking care of personal grooming?: A Little Help from another person toileting, which includes using toliet, bedpan, or urinal?: A Little Help from another person bathing (including washing, rinsing, drying)?: A Little Help from another person to put on and taking off regular upper body clothing?: A Little Help from another person to put on and taking off regular lower body clothing?: A Little  6 Click Score: 18    End of Session    OT Visit Diagnosis: Pain;History of falling (Z91.81);Repeated falls (R29.6);Muscle weakness (generalized) (M62.81);Unsteadiness on feet (R26.81) Pain - Right/Left: Left Pain - part of body: Hand   Activity Tolerance Patient limited by pain   Patient Left in bed;with call bell/phone within reach;with nursing/sitter in room;with bed alarm set   Nurse Communication          Time: [id number] OT Time Calculation (min): 29 min  Charges: OT General Charges $OT Visit: 1 Visit OT Treatments $Therapeutic Exercise: 8-22 mins $Massage: 8-22 mins  Anderson [place], [place] Office: [phone] 02/13/2023   Julien Girt 02/13/2023, 2:39 PM

## 2023-02-13 NOTE — Plan of Care (Signed)

## 2023-02-14 LAB — COMPREHENSIVE METABOLIC PANEL
ALT: 22 U/L (ref 0–44)
AST: 54 U/L — ABNORMAL HIGH (ref 15–41)
Albumin: 2 g/dL — ABNORMAL LOW (ref 3.5–5.0)
Alkaline Phosphatase: 124 U/L (ref 38–126)
Anion gap: 6 (ref 5–15)
BUN: 19 mg/dL (ref 6–20)
CO2: 24 mmol/L (ref 22–32)
Calcium: 7.8 mg/dL — ABNORMAL LOW (ref 8.9–10.3)
Chloride: 97 mmol/L — ABNORMAL LOW (ref 98–111)
Creatinine, Ser: 1.12 mg/dL (ref 0.61–1.24)
GFR, Estimated: >60 mL/min (ref >60)
Glucose, Bld: 91 mg/dL (ref 70–99)
Potassium: 3.9 mmol/L (ref 3.5–5.1)
Sodium: 127 mmol/L — ABNORMAL LOW (ref 135–145)
Total Bilirubin: 6.7 mg/dL — ABNORMAL HIGH (ref 0.3–1.2)
Total Protein: 5.3 g/dL — ABNORMAL LOW (ref 6.5–8.1)

## 2023-02-14 LAB — CBC
HCT: 24.3 % — ABNORMAL LOW (ref 39.0–52.0)
Hemoglobin: 8.4 g/dL — ABNORMAL LOW (ref 13.0–17.0)
MCH: 36.8 pg — ABNORMAL HIGH (ref 26.0–34.0)
MCHC: 34.6 g/dL (ref 30.0–36.0)
MCV: 106.6 fL — ABNORMAL HIGH (ref 80.0–100.0)
Platelets: 121 10*3/uL — ABNORMAL LOW (ref 150–400)
RBC: 2.28 MIL/uL — ABNORMAL LOW (ref 4.22–5.81)
RDW: 19.6 % — ABNORMAL HIGH (ref 11.5–15.5)
WBC: 11.8 10*3/uL — ABNORMAL HIGH (ref 4.0–10.5)
nRBC: 0.2 % (ref 0.0–0.2)

## 2023-02-14 NOTE — Plan of Care (Signed)
  Problem: Activity: Goal: Risk for activity intolerance will decrease Outcome: Progressing   Problem: Pain Managment: Goal: General experience of comfort will improve Outcome: Progressing   Problem: Safety: Goal: Ability to remain free from injury will improve Outcome: Progressing   

## 2023-02-14 NOTE — Plan of Care (Signed)

## 2023-02-14 NOTE — Progress Notes (Signed)
PROGRESS NOTE  Nathaniel Warren  [id number] DOB: [birth year] DOA: 01/22/2023 PCP: [name], NP   Brief Narrative: Patient is a 57 year old male with history of alcohol abuse/cirrhosis of liver, depression, chronic right ankle pain, suicidal attempts presented to the emergency department with complaint of left groin swelling, right arm swelling.  Found to have hematoma of the left groin as well as swelling of the right hand consistent with hematoma.  CT hand showed diffuse swelling without any abscess.    Hand surgery  consulted,recommended conservative management.   Hospital course remarkable for persistent pain and swelling of the right hand,finding of necrotic left 4th finger tip s/p  left ring finger I and D and left dorsal hand abscess I&D.  PT/OT recommended SNF earlier now changed the recommendation to outpatient PT. patient states he is not ready for discharge today and will wait till Sunday so that his friends  can make arrangements at home.  TOC following  Assessment & Plan:  Principal Problem:   Spontaneous hematoma of lower leg Active Problems:   Alcoholic cirrhosis of liver with ascites (HCC)   MDD (major depressive disorder), recurrent severe, without psychosis ([place])   Hyponatremia   Tobacco abuse   Suicidal ideations  Right /left hand hematoma/necrotic left 4th finger tip: Hand surgery was consulted and was following.   No indication for surgery for right forearm hematoma .  Given few doses of vitamin K, FFP 2 units here. Follow-up CT scan of the right hand done on 2/25 without evidence of abscess or collection. He was also found to have worsening of the ulcer on the left fourth fingertip, appeared  necrotic, foul-smelling.  MRI of the finger didnot  not show any abscess or osteomyelitis. Also developed left hand swelling.s/p  left ring finger I and D and left dorsal hand abscess I&D. Cultures on 2/27 showed corynebacterium and Prevotella.  Initially started on vancomycin  and zosyn , changed to Augmentin then transitioned to oritavancin x 1 on 3/3, plan for 1 week of Flagyl.  ID  was following. Right hand hematoma has completely resolved.  Left hand wound has dressing,orthopedics recommends daily dressing change.recommend follow up with Dr Greta Doom in 2 weeks.He still has mild leucocytosis but improving   Chronic pain syndrome: Has history of L5-S1 nerve compression with sciatica, chronic knee pain with foot drop.  Continue pain management, supportive care.    Spermatocele: Recommend ultrasound in 3 months   History of alcohol abuse/liver cirrhosis: Quit in 2023.  Elevated INR.  Given few days of vitamin K.  Has mild elevated liver enzymes along with bilirubin. Recommend to follow-up with GI/hepatology as an outpatient.Follows with [facility].  Has jaundice. On spironolactone, Lasix at home: continue .  Has some  edema of the lower extremities.    Hyponatremia: This is associated with his liver disease.  Sodium down to the range of 127 today.  Will check another BMP tomorrow   Macrocytic anemia/thrombocytopenia: This is secondary to chronic alcohol abuse, cirrhosis. continue thiamine /folic acid.  Hemoglobin stable.  Normal folate, vitamin B12 high   Hyperlipidemia: LDL of 155.  Not kept on statin due to liver cirrhosis   Suicidal attempt/PTSD: Psychiatry was following.  On paroxetine, quetiapine.  No longer requires IVC. No need for inpatient psychiatric admission as per psychiatry           Pressure Injury 02/09/23 Buttocks Left Stage 2 -  Partial thickness loss of dermis presenting as a shallow open injury with a  red, pink wound bed without slough. (Active)  02/09/23 1710  Location: Buttocks  Location Orientation: Left  Staging: Stage 2 -  Partial thickness loss of dermis presenting as a shallow open injury with a red, pink wound bed without slough.  Wound Description (Comments):   Present on Admission: No  Dressing Type Foam - Lift dressing to assess  site every shift 02/13/23 2012    DVT prophylaxis:SCDs Start: 01/23/23 0025     Code Status: Full Code  Family Communication: None at the bedside  Patient status: Inpatient  Patient is from :Home  Anticipated discharge [id number]   Estimated DC date:'Sunday   Consultants: Hand surgery,ID  Procedures:I and D  Antimicrobials:  Anti-infectives (From admission, onward)    Start     Dose/Rate Route Frequency Ordered Stop   02/08/23 1100  Oritavancin Diphosphate (ORBACTIV) 1,200 mg in dextrose 5 % IVPB        1,200 mg 333.3 mL/hr over 180 Minutes Intravenous Once 02/08/23 0935 02/08/23 1425   02/08/23 1030  metroNIDAZOLE (FLAGYL) tablet 500 mg        500 mg Oral Every 12 hours 02/08/23 0935 02/15/23 0959   02/07/23 1745  amoxicillin-clavulanate (AUGMENTIN) 875-125 MG per tablet 1 tablet  Status:  Discontinued        1 tablet Oral Every 12 hours 02/07/23 1659 02/08/23 0935   02/06/23 2200  cefadroxil (DURICEF) capsule 500 mg  Status:  Discontinued        50'$ 0 mg Oral 2 times daily 02/06/23 0939 02/06/23 0950   02/06/23 2200  cefadroxil (DURICEF) capsule 1,000 mg  Status:  Discontinued        1,000 mg Oral 2 times daily 02/06/23 0950 02/07/23 1652   02/02/23 0900  cefTRIAXone (ROCEPHIN) 2 g in sodium chloride 0.9 % 100 mL IVPB  Status:  Discontinued        2 g 200 mL/hr over 30 Minutes Intravenous Every 24 hours 02/01/23 1044 02/06/23 0938   02/01/23 1200  cefTRIAXone (ROCEPHIN) 1 g in sodium chloride 0.9 % 100 mL IVPB  Status:  Discontinued        1 g 200 mL/hr over 30 Minutes Intravenous  Once 02/01/23 1101 02/01/23 1150   01/31/23 2200  vancomycin (VANCOCIN) IVPB 1000 mg/200 mL premix  Status:  Discontinued        1,000 mg 200 mL/hr over 60 Minutes Intravenous Every 12 hours 01/31/23 0840 02/06/23 0938   01/31/23 0900  cefTRIAXone (ROCEPHIN) 1 g in sodium chloride 0.9 % 100 mL IVPB  Status:  Discontinued        1 g 200 mL/hr over 30 Minutes Intravenous Every 24 hours 01/31/23  0811 02/01/23 1044   01/31/23 0900  vancomycin (VANCOREADY) IVPB 1500 mg/300 mL        1,500 mg 150 mL/hr over 120 Minutes Intravenous  Once 01/31/23 0840 01/31/23 1336       Subjective: Patient seen and examined at bedside today.  Lying in bed.  Comfortable, no new complaints.  Improvement in the edema of the legs today  Objective: Vitals:   02/13/23 1327 02/13/23 2012 02/14/23 0405 02/14/23 1050  BP: (!) 135/57 (!) 98/48 (!) 116/55 123/64  Pulse: 64 73 60 61  Resp: '18 20 18 20  '$ Temp: 98.6 F (37 C) 98.9 F (37.2 C) 98.4 F (36.9 C) 98.8 F (37.1 C)  TempSrc: Oral Oral Oral Oral  SpO2: 100% 98% 98% 99%  Weight:      Height:  Intake/Output Summary (Last 24 hours) at 02/14/2023 1119 Last data filed at 02/13/2023 2300 Gross per 24 hour  Intake --  Output 950 ml  Net -950 ml   Filed Weights   01/23/23 2007  Weight: 77.7 kg    Examination:  General exam: Overall comfortable, not in distress HEENT: PERRL,icterus Respiratory system:  no wheezes or crackles  Cardiovascular system: S1 & S2 heard, RRR.  Gastrointestinal system: Abdomen is mildly distended, soft and nontender. Central nervous system: Alert and oriented Extremities: Dressing on the left hand, trace edema on bilateral lower extremities more on the right ,left ring dressing Skin: Icterus     Data Reviewed: I have personally reviewed following labs and imaging studies  CBC: Recent Labs  Lab 02/08/23 1454 02/10/23 0407 02/11/23 0418 02/12/23 0422 02/14/23 0416  WBC 14.6* 13.3* 13.1* 12.6* 11.8*  HGB 8.9* 8.4* 8.7* 8.3* 8.4*  HCT 25.9* 24.9* 25.0* 24.1* 24.3*  MCV 106.6* 108.7* 106.8* 108.6* 106.6*  PLT 118* 111* 109* 118* 123XX123*   Basic Metabolic Panel: Recent Labs  Lab 02/08/23 0743 02/10/23 0407 02/11/23 0418 02/12/23 0422 02/14/23 0416  NA 136 135 133* 134* 127*  K 4.2 4.2 4.1 4.1 3.9  CL 104 103 103 102 97*  CO2 '26 26 25 26 24  '$ GLUCOSE 114* 97 104* 110* 91  BUN 31* 24* 21* 23* 19   CREATININE 1.23 1.13 1.10 1.18 1.12  CALCIUM 8.7* 8.3* 8.2* 8.1* 7.8*     No results found for this or any previous visit (from the past 240 hour(s)).    Radiology Studies: No results found.  Scheduled Meds:  folic acid  1 mg Oral Daily   furosemide  40 mg Oral Daily   hydrocerin   Topical BID   metroNIDAZOLE  500 mg Oral Q12H   PARoxetine  10 mg Oral Daily   polyethylene glycol  17 g Oral Daily   QUEtiapine  25 mg Oral QHS   sodium chloride flush  3 mL Intravenous Q12H   spironolactone  50 mg Oral Daily   thiamine  100 mg Oral Daily   Continuous Infusions:  sodium chloride       LOS: 22 days   Shelly Coss, MD [facility] P3/08/2023, 11:19 AM

## 2023-02-15 LAB — COMPREHENSIVE METABOLIC PANEL
ALT: 20 U/L (ref 0–44)
AST: 55 U/L — ABNORMAL HIGH (ref 15–41)
Albumin: 2.2 g/dL — ABNORMAL LOW (ref 3.5–5.0)
Alkaline Phosphatase: 140 U/L — ABNORMAL HIGH (ref 38–126)
Anion gap: 6 (ref 5–15)
BUN: 24 mg/dL — ABNORMAL HIGH (ref 6–20)
CO2: 28 mmol/L (ref 22–32)
Calcium: 8.2 mg/dL — ABNORMAL LOW (ref 8.9–10.3)
Chloride: 100 mmol/L (ref 98–111)
Creatinine, Ser: 1.33 mg/dL — ABNORMAL HIGH (ref 0.61–1.24)
GFR, Estimated: >60 mL/min (ref >60)
Glucose, Bld: 131 mg/dL — ABNORMAL HIGH (ref 70–99)
Potassium: 4.2 mmol/L (ref 3.5–5.1)
Sodium: 134 mmol/L — ABNORMAL LOW (ref 135–145)
Total Bilirubin: 6.5 mg/dL — ABNORMAL HIGH (ref 0.3–1.2)
Total Protein: 6 g/dL — ABNORMAL LOW (ref 6.5–8.1)

## 2023-02-15 MED ORDER — SODIUM CHLORIDE 0.9 % IV SOLN: INTRAVENOUS | Status: DC

## 2023-02-15 NOTE — Plan of Care (Signed)

## 2023-02-15 NOTE — Progress Notes (Signed)
PROGRESS NOTE  Nathaniel Warren  [id number] DOB: 25-Jul-1966 DOA: 01/22/2023 PCP: [name], NP   Brief Narrative: Patient is a 57 year old male with history of alcohol abuse/cirrhosis of liver, depression, chronic right ankle pain, suicidal attempts presented to the emergency department with complaint of left groin swelling, right arm swelling.  Found to have hematoma of the left groin as well as swelling of the right hand consistent with hematoma.  CT hand showed diffuse swelling without any abscess.    Hand surgery  consulted,recommended conservative management.   Hospital course remarkable for persistent pain and swelling of the right hand,finding of necrotic left 4th finger tip s/p  left ring finger I and D and left dorsal hand abscess I&D.  PT/OT recommended SNF earlier now changed the recommendation to outpatient PT. patient states he is not ready for discharge today and will wait till tomorrow so that his friends  can make arrangements at home.  TOC following  Assessment & Plan:  Principal Problem:   Spontaneous hematoma of lower leg Active Problems:   Alcoholic cirrhosis of liver with ascites (HCC)   MDD (major depressive disorder), recurrent severe, without psychosis ([place])   Hyponatremia   Tobacco abuse   Suicidal ideations  Right /left hand hematoma/necrotic left 4th finger tip: Hand surgery was consulted and was following.   No indication for surgery for right forearm hematoma .  Given few doses of vitamin K, FFP 2 units here. Follow-up CT scan of the right hand done on 2/25 without evidence of abscess or collection. He was also found to have worsening of the ulcer on the left fourth fingertip, appeared  necrotic, foul-smelling.  MRI of the finger didnot  not show any abscess or osteomyelitis. Also developed left hand swelling.s/p  left ring finger I and D and left dorsal hand abscess I&D. Cultures on 2/27 showed corynebacterium and Prevotella.  Initially started on  vancomycin and zosyn , changed to Augmentin then transitioned to oritavancin x 1 on 3/3, plan for 1 week of Flagyl.  ID  was following. Right hand hematoma has completely resolved.  Left hand wound has dressing,orthopedics recommends daily dressing change.recommend follow up with Dr Greta Doom in 2 weeks.He still has mild leucocytosis but improved   Chronic pain syndrome: Has history of L5-S1 nerve compression with sciatica, chronic knee pain with foot drop.  Continue pain management, supportive care.    Spermatocele: Recommend ultrasound in 3 months   History of alcohol abuse/liver cirrhosis: Quit in 2023.  Elevated INR.  Given few days of vitamin K.  Has mild elevated liver enzymes along with bilirubin. Recommend to follow-up with GI/hepatology as an outpatient.Follows with [facility].  Has jaundice. On spironolactone, Lasix at home: continue .  Has some  edema of the lower extremities.    Mild AKI: Was given Lasix for volume overload few days ago.  Will hold Lasix and is bilateral for today.  Check BMP tomorrow.  Patient is also having some dark urine.  Started on Normal Saline at 50 cc/h  Macrocytic anemia/thrombocytopenia: This is secondary to chronic alcohol abuse, cirrhosis. continue thiamine /folic acid.  Hemoglobin stable.  Normal folate, vitamin B12 high   Hyperlipidemia: LDL of 155.  Not kept on statin due to liver cirrhosis   Suicidal attempt/PTSD: Psychiatry was following.  On paroxetine, quetiapine.  No longer requires IVC. No need for inpatient psychiatric admission as per psychiatry           Pressure Injury 02/09/23 Buttocks Left Stage  2 -  Partial thickness loss of dermis presenting as a shallow open injury with a red, pink wound bed without slough. (Active)  02/09/23 1710  Location: Buttocks  Location Orientation: Left  Staging: Stage 2 -  Partial thickness loss of dermis presenting as a shallow open injury with a red, pink wound bed without slough.  Wound Description  (Comments):   Present on Admission: No  Dressing Type Foam - Lift dressing to assess site every shift 02/15/23 0740    DVT prophylaxis:SCDs Start: 01/23/23 0025     Code Status: Full Code  Family Communication: None at the bedside  Patient status: Inpatient  Patient is from :Home  Anticipated discharge [id number]   Estimated DC date:tomorrow if improvement in the kidney function   Consultants: Hand surgery,ID  Procedures:I and D  Antimicrobials:  Anti-infectives (From admission, onward)    Start     Dose/Rate Route Frequency Ordered Stop   02/08/23 1100  Oritavancin Diphosphate (ORBACTIV) 1,200 mg in dextrose 5 % IVPB        1,200 mg 333.3 mL/hr over 180 Minutes Intravenous Once 02/08/23 0935 02/08/23 1425   02/08/23 1030  metroNIDAZOLE (FLAGYL) tablet 500 mg        500 mg Oral Every 12 hours 02/08/23 0935 02/14/23 2115   02/07/23 1745  amoxicillin-clavulanate (AUGMENTIN) 875-125 MG per tablet 1 tablet  Status:  Discontinued        1 tablet Oral Every 12 hours 02/07/23 1659 02/08/23 0935   02/06/23 2200  cefadroxil (DURICEF) capsule 500 mg  Status:  Discontinued        500 mg Oral 2 times daily 02/06/23 0939 02/06/23 0950   02/06/23 2200  cefadroxil (DURICEF) capsule 1,000 mg  Status:  Discontinued        1,000 mg Oral 2 times daily 02/06/23 0950 02/07/23 1652   02/02/23 0900  cefTRIAXone (ROCEPHIN) 2 g in sodium chloride 0.9 % 100 mL IVPB  Status:  Discontinued        2 g 200 mL/hr over 30 Minutes Intravenous Every 24 hours 02/01/23 1044 02/06/23 0938   02/01/23 1200  cefTRIAXone (ROCEPHIN) 1 g in sodium chloride 0.9 % 100 mL IVPB  Status:  Discontinued        1 g 200 mL/hr over 30 Minutes Intravenous  Once 02/01/23 1101 02/01/23 1150   01/31/23 2200  vancomycin (VANCOCIN) IVPB 1000 mg/200 mL premix  Status:  Discontinued        1,000 mg 200 mL/hr over 60 Minutes Intravenous Every 12 hours 01/31/23 0840 02/06/23 0938   01/31/23 0900  cefTRIAXone (ROCEPHIN) 1 g in sodium  chloride 0.9 % 100 mL IVPB  Status:  Discontinued        1 g 200 mL/hr over 30 Minutes Intravenous Every 24 hours 01/31/23 0811 02/01/23 1044   01/31/23 0900  vancomycin (VANCOREADY) IVPB 1500 mg/300 mL        1,500 mg 150 mL/hr over 120 Minutes Intravenous  Once 01/31/23 0840 01/31/23 1336       Subjective: Patient seen and examined at bedside.  Hemodynamically stable.  Having dark urine.  Still having pain on the left hand.  Discussed about need of dressing of left hand  at home, he does not feel confident for that.  Objective: Vitals:   02/14/23 1050 02/14/23 1545 02/14/23 2100 02/15/23 0600  BP: 123/64 (!) 111/51 (!) 103/51 (!) 114/51  Pulse: 61 69 65 63  Resp: '20 16 16 17  '$ Temp: 98.8  F (37.1 C) 98.7 F (37.1 C) 98.4 F (36.9 C) 98.2 F (36.8 C)  TempSrc: Oral Oral Oral Oral  SpO2: 99% 95% 97% 97%  Weight:      Height:        Intake/Output Summary (Last 24 hours) at 02/15/2023 1150 Last data filed at 02/15/2023 0600 Gross per 24 hour  Intake --  Output 600 ml  Net -600 ml   Filed Weights   01/23/23 2007  Weight: 77.7 kg    Examination:  General exam: Overall comfortable, not in distress HEENT: PERRL Respiratory system:  no wheezes or crackles  Cardiovascular system: S1 & S2 heard, RRR.  Gastrointestinal system: Abdomen is mildly distended, soft and nontender. Central nervous system: Alert and oriented Extremities: trace lower extremity edema, dressing on the left hand, left ring finger  skin:  icterus     Data Reviewed: I have personally reviewed following labs and imaging studies  CBC: Recent Labs  Lab 02/08/23 1454 02/10/23 0407 02/11/23 0418 02/12/23 0422 02/14/23 0416  WBC 14.6* 13.3* 13.1* 12.6* 11.8*  HGB 8.9* 8.4* 8.7* 8.3* 8.4*  HCT 25.9* 24.9* 25.0* 24.1* 24.3*  MCV 106.6* 108.7* 106.8* 108.6* 106.6*  PLT 118* 111* 109* 118* 123XX123*   Basic Metabolic Panel: Recent Labs  Lab 02/10/23 0407 02/11/23 0418 02/12/23 0422 02/14/23 0416  02/15/23 0400  NA 135 133* 134* 127* 134*  K 4.2 4.1 4.1 3.9 4.2  CL 103 103 102 97* 100  CO2 '26 25 26 24 28  '$ GLUCOSE 97 104* 110* 91 131*  BUN 24* 21* 23* 19 24*  CREATININE 1.13 1.10 1.18 1.12 1.33*  CALCIUM 8.3* 8.2* 8.1* 7.8* 8.2*     No results found for this or any previous visit (from the past 240 hour(s)).    Radiology Studies: No results found.  Scheduled Meds:  folic acid  1 mg Oral Daily   hydrocerin   Topical BID   PARoxetine  10 mg Oral Daily   polyethylene glycol  17 g Oral Daily   QUEtiapine  25 mg Oral QHS   sodium chloride flush  3 mL Intravenous Q12H   thiamine  100 mg Oral Daily   Continuous Infusions:  sodium chloride       LOS: 23 days   Shelly Coss, MD [facility] P3/09/2023, 11:50 AM

## 2023-02-15 NOTE — Progress Notes (Signed)
Occupational Therapy Treatment Patient Details Name: Nathaniel Warren MRN: [id number] DOB: March 15, 1966 Today's Date: 02/15/2023   History of present illness 57 yo male with onset of groin and RT dorsal hand bruising and edema with reported light trauma during a fall.  I&D to LT D4 on 02/03/23. Has h/o R foot drop without AFO. + for suicidal ideations.   PMHx:  etoh abuse, depression with h/o 4 suicide attempts, stroke, seizures, liver failure, R ankle fracture with ORIF, atherosclerosis, cirrhosis, diverticulosis, osteopenia.   OT comments  In preparation for discharge home patient able to perform toilet transfer simulating home environment and with use of platform walker. He will need to perform sponge baths at home or acquire a shower chair for safety. Attempted ambulation with cane but patient could not tolerate the required amount of weight bearing through R hand. Still reliant on platform walker for mobility. Patient concerned about dressing changes. Will continue to follow.    Recommendations for follow up therapy are one component of a multi-disciplinary discharge planning process, led by the attending physician.  Recommendations may be updated based on patient status, additional functional criteria and insurance authorization.    Follow Up Recommendations  Other (comment) (OP OT vs HH OT)     Assistance Recommended at Discharge Frequent or constant Supervision/Assistance  Patient can return home with the following  A little help with bathing/dressing/bathroom;A little help with walking and/or transfers;Assistance with cooking/housework;Assist for transportation;Direct supervision/assist for medications management   Equipment Recommendations  Tub/shower seat    Recommendations for Other Services      Precautions / Restrictions Precautions Precautions: Fall Precaution Comments: R foot drop, does better with his shoes on (less ankle roll) Required Braces or Orthoses:  Splint/Cast Splint/Cast: LT ring finger Restrictions Weight Bearing Restrictions: No Other Position/Activity Restrictions: Limited LT hand WB since I&D to D4       Mobility Bed Mobility                    Transfers                         Balance Overall balance assessment: Needs assistance Sitting-balance support: No upper extremity supported, Feet supported Sitting balance-Leahy Scale: Good     Standing balance support: During functional activity, Reliant on assistive device for balance Standing balance-Leahy Scale: Poor                             ADL either performed or assessed with clinical judgement   ADL Overall ADL's : Needs assistance/impaired                         Toilet Transfer: Min guard;Rolling walker (2 wheels) Toilet Transfer Details (indicate cue type and reason): Simulated home environment and performed toilet transfer with min guard Toileting- Clothing Manipulation and Hygiene: Supervision/safety;Sitting/lateral lean Toileting - Clothing Manipulation Details (indicate cue type and reason): increased time.   Tub/Shower Transfer Details (indicate cue type and reason): Reports won't be attempting shower due to fear of falling. Functional mobility during ADLs: Min guard (platform walker) General ADL Comments: Able to transfer and perform toilet transfer as at home in preparation for discharge. He would benefit from a shower chair otherwise sink baths will do.    Extremity/Trunk Assessment              Vision  Perception     Praxis      Cognition Arousal/Alertness: Awake/alert Behavior During Therapy: WFL for tasks assessed/performed Overall Cognitive Status: Within Functional Limits for tasks assessed                                          Exercises      Shoulder Instructions       General Comments      Pertinent Vitals/ Pain       Pain Assessment Pain Assessment:  Faces Faces Pain Scale: Hurts even more Pain Location: Lt hand during activity Pain Descriptors / Indicators: Grimacing, Aching Pain Intervention(s): Monitored during session  Home Living                                          Prior Functioning/Environment              Frequency  Min 2X/week        Progress Toward Goals  OT Goals(current goals can now be found in the care plan section)  Progress towards OT goals: Progressing toward goals  Acute Rehab OT Goals Patient Stated Goal: be prepared for discharge home OT Goal Formulation: With patient Time For Goal Achievement: 02/20/23 Potential to Achieve Goals: Good  Plan Discharge plan remains appropriate    Co-evaluation          OT goals addressed during session: ADL's and self-care      AM-PAC OT "6 Clicks" Daily Activity     Outcome Measure   Help from another person eating meals?: A Little Help from another person taking care of personal grooming?: A Little Help from another person toileting, which includes using toliet, bedpan, or urinal?: A Little Help from another person bathing (including washing, rinsing, drying)?: A Little Help from another person to put on and taking off regular upper body clothing?: A Little Help from another person to put on and taking off regular lower body clothing?: A Little 6 Click Score: 18    End of Session Equipment Utilized During Treatment: Rolling walker (2 wheels);Gait belt  OT Visit Diagnosis: Pain;History of falling (Z91.81);Repeated falls (R29.6);Muscle weakness (generalized) (M62.81);Unsteadiness on feet (R26.81) Pain - Right/Left: Left Pain - part of body: Hand   Activity Tolerance Patient tolerated treatment well   Patient Left in bed;with call bell/phone within reach;with nursing/sitter in room;with bed alarm set   Nurse Communication Mobility status        Time: [id number] OT Time Calculation (min): 22 min  Charges: OT General  Charges $OT Visit: 1 Visit OT Treatments $Self Care/Home Management : 8-22 mins  Gustavo Lah, OTR/L [place]  Office [phone]   Nathaniel Warren 02/15/2023, 4:07 PM

## 2023-02-16 LAB — BASIC METABOLIC PANEL
Anion gap: 7 (ref 5–15)
BUN: 21 mg/dL — ABNORMAL HIGH (ref 6–20)
CO2: 25 mmol/L (ref 22–32)
Calcium: 8.2 mg/dL — ABNORMAL LOW (ref 8.9–10.3)
Chloride: 100 mmol/L (ref 98–111)
Creatinine, Ser: 0.8 mg/dL (ref 0.61–1.24)
GFR, Estimated: >60 mL/min (ref >60)
Glucose, Bld: 118 mg/dL — ABNORMAL HIGH (ref 70–99)
Potassium: 4.4 mmol/L (ref 3.5–5.1)
Sodium: 132 mmol/L — ABNORMAL LOW (ref 135–145)

## 2023-02-16 MED ORDER — SPIRONOLACTONE 25 MG PO TABS
50.0000 mg | ORAL_TABLET | Freq: Every day | ORAL | Status: DC
  Administered 2023-02-16 – 2023-03-02 (×15): 50 mg via ORAL
  Filled 2023-02-16 (×15): qty 2

## 2023-02-16 MED ORDER — FUROSEMIDE 40 MG PO TABS
40.0000 mg | ORAL_TABLET | Freq: Every day | ORAL | Status: DC
  Administered 2023-02-16: 40 mg via ORAL
  Filled 2023-02-16: qty 1

## 2023-02-16 MED ORDER — FUROSEMIDE 40 MG PO TABS
40.0000 mg | ORAL_TABLET | Freq: Every day | ORAL | Status: DC
  Administered 2023-02-17 – 2023-03-02 (×14): 40 mg via ORAL
  Filled 2023-02-16 (×14): qty 1

## 2023-02-16 MED ORDER — FUROSEMIDE 10 MG/ML IJ SOLN
40.0000 mg | Freq: Once | INTRAMUSCULAR | Status: AC
  Administered 2023-02-16: 40 mg via INTRAVENOUS
  Filled 2023-02-16: qty 4

## 2023-02-16 NOTE — Progress Notes (Signed)
Physical Therapy Treatment Patient Details Name: Nathaniel Warren MRN: [id number] DOB: [birth year] Today's Date: 02/16/2023   History of Present Illness 57 yo male with onset of groin and RT dorsal hand bruising and edema with reported light trauma during a fall.  I&D to LT D4 on 02/03/23. Has h/o R foot drop without AFO. + for suicidal ideations.   PMHx:  etoh abuse, depression with h/o 4 suicide attempts, stroke, seizures, liver failure, R ankle fracture with ORIF, atherosclerosis, cirrhosis, diverticulosis, osteopenia.    PT Comments    Pt c/o pain increased edema in extremities especially Rt LE and foot.  Pt states he is unable to ambulate. Pt encouraged to perform OOB and attempt since he is supposed to d/c tomorrow.  Pt unable to take weight on Rt LE due to increased pain.  Pt also reporting pain in right hand with gripping RW and limited use of left hand which is also NWB requiring use of platform for RW to mobilize.  Pt asking about ST-SNF. Pt encouraged to speak with TOC team.  Pt does not appear eligible for SNF and also not mobilizing well at this time.  If pt to d/c, would continue to recommend left platform RW.  Pt states he does not have room in housing for a w/c upon d/c.  *TOC notified of updated SNF recommendation and also left platform RW    Recommendations for follow up therapy are one component of a multi-disciplinary discharge planning process, led by the attending physician.  Recommendations may be updated based on patient status, additional functional criteria and insurance authorization.  Follow Up Recommendations  Skilled nursing-short term rehab (<3 hours/day) Can patient physically be transported by private vehicle: No   Assistance Recommended at Discharge Intermittent Supervision/Assistance  Patient can return home with the following A little help with walking and/or transfers;Direct supervision/assist for medications management;Assistance with cooking/housework;Assist  for transportation;A little help with bathing/dressing/bathroom;Help with stairs or ramp for entrance   Equipment Recommendations  Rolling walker (2 wheels);Other (comment) (left platform)    Recommendations for Other Services       Precautions / Restrictions Precautions Precautions: Fall Precaution Comments: R foot drop, does better with his shoes on (less ankle roll) Required Braces or Orthoses: Splint/Cast Splint/Cast: LT ring finger Restrictions Other Position/Activity Restrictions: Limited LT hand WB since I&D to D4     Mobility  Bed Mobility Overal bed mobility: Modified Independent             General bed mobility comments: self able with increased time    Transfers Overall transfer level: Needs assistance Equipment used: Left platform walker Transfers: Sit to/from Stand Sit to Stand: Min guard, From elevated surface           General transfer comment: close guard due to pain, increased difficulty and effort to rise, pt reports right hand pain and right foot pain limiting his mobility (as well as left hand splinted and keeping NWB at this time); pt not able to take weight on right LE and keeps weight through upper body and left LE only    Ambulation/Gait                   Stairs             Wheelchair Mobility    Modified Rankin (Stroke Patients Only)       Balance  Cognition Arousal/Alertness: Awake/alert Behavior During Therapy: WFL for tasks assessed/performed Overall Cognitive Status: Within Functional Limits for tasks assessed                                          Exercises      General Comments        Pertinent Vitals/Pain Pain Assessment Pain Assessment: Faces Faces Pain Scale: Hurts even more Pain Location: right hand palmar surface and deep in midfoot of right foot (also edematous right leg/foot) Pain Descriptors / Indicators:  Grimacing, Sore, Tender, Guarding Pain Intervention(s): Repositioned, Monitored during session    Home Living                          Prior Function            PT Goals (current goals can now be found in the care plan section) Progress towards PT goals: Progressing toward goals    Frequency    Min 3X/week      PT Plan Discharge plan needs to be updated    Co-evaluation              AM-PAC PT "6 Clicks" Mobility   Outcome Measure  Help needed turning from your back to your side while in a flat bed without using bedrails?: None Help needed moving from lying on your back to sitting on the side of a flat bed without using bedrails?: A Little Help needed moving to and from a bed to a chair (including a wheelchair)?: A Little Help needed standing up from a chair using your arms (e.g., wheelchair or bedside chair)?: A Little Help needed to walk in hospital room?: A Lot Help needed climbing 3-5 steps with a railing? : A Lot 6 Click Score: 17    End of Session   Activity Tolerance: Patient limited by pain Patient left: in bed;with call bell/phone within reach;with bed alarm set Nurse Communication: Mobility status (notified of inability to ambulate and reported pain) PT Visit Diagnosis: Muscle weakness (generalized) (M62.81);Other abnormalities of gait and mobility (R26.89);History of falling (Z91.81)     Time: [id number] PT Time Calculation (min) (ACUTE ONLY): 23 min  Charges:  $Therapeutic Activity: 8-22 mins                     Jannette Spanner PT, DPT Physical Therapist Acute [facility] Services Preferred contact method: Secure Chat Weekend Pager Only: [phone] Office: [place] 02/16/2023, 4:50 PM

## 2023-02-16 NOTE — Progress Notes (Signed)
PROGRESS NOTE  Nathaniel Warren  [id number] DOB: 06-Jan-1966 DOA: 01/22/2023 PCP: [name], NP   Brief Narrative: Patient is a 57 year old male with history of alcohol abuse/cirrhosis of liver, depression, chronic right ankle pain, suicidal attempts presented to the emergency department with complaint of left groin swelling, right arm swelling.  Found to have hematoma of the left groin as well as swelling of the right hand consistent with hematoma.  CT hand showed diffuse swelling without any abscess.    Hand surgery  consulted,recommended conservative management.   Hospital course remarkable for persistent pain and swelling of the right hand,finding of necrotic left 4th finger tip s/p  left ring finger I and D and left dorsal hand abscess I&D.  PT/OT recommended SNF earlier now changed the recommendation to outpatient PT. Plan for dc tomorrow  Assessment & Plan:  Principal Problem:   Spontaneous hematoma of lower leg Active Problems:   Alcoholic cirrhosis of liver with ascites (HCC)   MDD (major depressive disorder), recurrent severe, without psychosis ([place])   Hyponatremia   Tobacco abuse   Suicidal ideations  Right /left hand hematoma/necrotic left 4th finger tip: Hand surgery was consulted and was following.   No indication for surgery for right forearm hematoma .  Given few doses of vitamin K, FFP 2 units here. Follow-up CT scan of the right hand done on 2/25 without evidence of abscess or collection. He was also found to have worsening of the ulcer on the left fourth fingertip, appeared  necrotic, foul-smelling.  MRI of the finger didnot  not show any abscess or osteomyelitis. Also developed left hand swelling.s/p  left ring finger I and D and left dorsal hand abscess I&D. Cultures on 2/27 showed corynebacterium and Prevotella.  Initially started on vancomycin and zosyn , changed to Augmentin then transitioned to oritavancin x 1 on 3/3, plan for 1 week of Flagyl.  ID  was  following. Right hand hematoma has completely resolved.  Left hand wound has dressing,orthopedics recommends daily dressing change.recommend follow up with Dr Greta Doom in 2 weeks.He still has mild leucocytosis but improved. RN has  provided teaching on dressing  History of alcohol abuse/liver cirrhosis: Quit in 2023.  Elevated INR.  Given few days of vitamin K.  Has mild elevated liver enzymes along with bilirubin. Recommend to follow-up with GI/hepatology as an outpatient.Follows with [facility].  Has jaundice. On spironolactone, Lasix at home: continue .  Has some  worsening edema of the lower extremities,will give him a dose of lasix IV 40 mg once   Chronic pain syndrome: Has history of L5-S1 nerve compression with sciatica, chronic knee pain with foot drop.  Continue pain management, supportive care.    Spermatocele: Recommend ultrasound in 3 months   Macrocytic anemia/thrombocytopenia: This is secondary to chronic alcohol abuse, cirrhosis. continue thiamine /folic acid.  Hemoglobin stable.  Normal folate, vitamin B12 high   Hyperlipidemia: LDL of 155.  Not kept on statin due to liver cirrhosis   Suicidal attempt/PTSD: Psychiatry was following.  On paroxetine, quetiapine.  No longer requires IVC. No need for inpatient psychiatric admission as per psychiatry           Pressure Injury 02/09/23 Buttocks Left Stage 2 -  Partial thickness loss of dermis presenting as a shallow open injury with a red, pink wound bed without slough. (Active)  02/09/23 1710  Location: Buttocks  Location Orientation: Left  Staging: Stage 2 -  Partial thickness loss of dermis presenting as a shallow  open injury with a red, pink wound bed without slough.  Wound Description (Comments):   Present on Admission: No  Dressing Type Foam - Lift dressing to assess site every shift 02/16/23 0900    DVT prophylaxis:SCDs Start: 01/23/23 0025     Code Status: Full Code  Family Communication: None at the  bedside  Patient status: Inpatient  Patient is from :Home  Anticipated discharge [id number]   Estimated DC date:tomorrow if improvement in LE edema   Consultants: Hand surgery,ID  Procedures:I and D  Antimicrobials:  Anti-infectives (From admission, onward)    Start     Dose/Rate Route Frequency Ordered Stop   02/08/23 1100  Oritavancin Diphosphate (ORBACTIV) 1,200 mg in dextrose 5 % IVPB        1,200 mg 333.3 mL/hr over 180 Minutes Intravenous Once 02/08/23 0935 02/08/23 1425   02/08/23 1030  metroNIDAZOLE (FLAGYL) tablet 500 mg        500 mg Oral Every 12 hours 02/08/23 0935 02/14/23 2115   02/07/23 1745  amoxicillin-clavulanate (AUGMENTIN) 875-125 MG per tablet 1 tablet  Status:  Discontinued        1 tablet Oral Every 12 hours 02/07/23 1659 02/08/23 0935   02/06/23 2200  cefadroxil (DURICEF) capsule 500 mg  Status:  Discontinued        500 mg Oral 2 times daily 02/06/23 0939 02/06/23 0950   02/06/23 2200  cefadroxil (DURICEF) capsule 1,000 mg  Status:  Discontinued        1,000 mg Oral 2 times daily 02/06/23 0950 02/07/23 1652   02/02/23 0900  cefTRIAXone (ROCEPHIN) 2 g in sodium chloride 0.9 % 100 mL IVPB  Status:  Discontinued        2 g 200 mL/hr over 30 Minutes Intravenous Every 24 hours 02/01/23 1044 02/06/23 0938   02/01/23 1200  cefTRIAXone (ROCEPHIN) 1 g in sodium chloride 0.9 % 100 mL IVPB  Status:  Discontinued        1 g 200 mL/hr over 30 Minutes Intravenous  Once 02/01/23 1101 02/01/23 1150   01/31/23 2200  vancomycin (VANCOCIN) IVPB 1000 mg/200 mL premix  Status:  Discontinued        1,000 mg 200 mL/hr over 60 Minutes Intravenous Every 12 hours 01/31/23 0840 02/06/23 0938   01/31/23 0900  cefTRIAXone (ROCEPHIN) 1 g in sodium chloride 0.9 % 100 mL IVPB  Status:  Discontinued        1 g 200 mL/hr over 30 Minutes Intravenous Every 24 hours 01/31/23 0811 02/01/23 1044   01/31/23 0900  vancomycin (VANCOREADY) IVPB 1500 mg/300 mL        1,500 mg 150 mL/hr over 120  Minutes Intravenous  Once 01/31/23 0840 01/31/23 1336       Subjective: Patient seen and examined at bedside today.  Hemodynamically stable.  He has developed bilateral lower extremity edema again today.  IV fluid discontinued and ordered a dose of Lasix IV.  We discussed about possible plan to home tomorrow  Objective: Vitals:   02/15/23 0600 02/15/23 1412 02/15/23 2131 02/16/23 0623  BP: (!) 114/51 (!) 119/52 (!) 107/51 (!) 124/55  Pulse: 63 65 61 66  Resp: '17 16 18 18  '$ Temp: 98.2 F (36.8 C) 98.9 F (37.2 C) 98.6 F (37 C) 98.2 F (36.8 C)  TempSrc: Oral  Oral Oral  SpO2: 97% 96% 97% 98%  Weight:      Height:        Intake/Output Summary (Last 24 hours) at  02/16/2023 1310 Last data filed at 02/16/2023 0600 Gross per 24 hour  Intake 812.42 ml  Output --  Net 812.42 ml   Filed Weights   01/23/23 2007  Weight: 77.7 kg    Examination:  General exam: Overall comfortable, not in distress,icteric HEENT: PERRL Respiratory system:  no wheezes or crackles  Cardiovascular system: S1 & S2 heard, RRR.  Gastrointestinal system: Abdomen is nondistended, soft and nontender. Central nervous system: Alert and oriented Extremities: Bilateral lower extremity, worse on the right foot,dressing on the left forearm,hand Skin: icterus,scattered echymosis   Data Reviewed: I have personally reviewed following labs and imaging studies  CBC: Recent Labs  Lab 02/10/23 0407 02/11/23 0418 02/12/23 0422 02/14/23 0416  WBC 13.3* 13.1* 12.6* 11.8*  HGB 8.4* 8.7* 8.3* 8.4*  HCT 24.9* 25.0* 24.1* 24.3*  MCV 108.7* 106.8* 108.6* 106.6*  PLT 111* 109* 118* [id number]*   Basic Metabolic Panel: Recent Labs  Lab 02/11/23 0418 02/12/23 0422 02/14/23 0416 02/15/23 0400 02/16/23 0344  NA 133* 134* 127* 134* 132*  K 4.1 4.1 3.9 4.2 4.4  CL 103 102 97* 100 100  CO2 '25 26 24 28 25  '$ GLUCOSE 104* 110* 91 131* 118*  BUN 21* 23* 19 24* 21*  CREATININE 1.10 1.18 1.12 1.33* 0.80  CALCIUM 8.2* 8.1*  7.8* 8.2* 8.2*     No results found for this or any previous visit (from the past 240 hour(s)).    Radiology Studies: No results found.  Scheduled Meds:  folic acid  1 mg Oral Daily   hydrocerin   Topical BID   PARoxetine  10 mg Oral Daily   polyethylene glycol  17 g Oral Daily   QUEtiapine  25 mg Oral QHS   sodium chloride flush  3 mL Intravenous Q12H   spironolactone  50 mg Oral Daily   thiamine  100 mg Oral Daily   Continuous Infusions:  sodium chloride       LOS: 24 days   Shelly Coss, MD [facility] P3/10/2023, 1:10 PM

## 2023-02-16 NOTE — Plan of Care (Signed)

## 2023-02-16 NOTE — Consult Note (Signed)
[facility] Face-to-Face Psychiatry Consult    Patient Identification: Nathaniel Warren MRN:  [id number] Principal Diagnosis: Spontaneous hematoma of lower leg Diagnosis:  Principal Problem:   Spontaneous hematoma of lower leg Active Problems:   MDD (major depressive disorder), recurrent severe, without psychosis ([place])   Hyponatremia   Tobacco abuse   Alcoholic cirrhosis of liver with ascites ([place])   Suicidal ideations   Total Time spent with patient: 20 minutes  Subjective:   Nathaniel Warren is a 57 year old male with right hand injury, thigh hematoma.  He has a psychiatric h/o bipolar d/o vs mdd, gad, ptsd, alcohol use disorder.   Initial:  Nathaniel Warren is a 57 y.o. male patient admitted with .hx for polysubstance abuse, ETOH use, nerve compression L5-S1, MDD, suicide attempt x 4-who endorses suicidal ideations, and intent.  He is guarded and does not disclose the plan.  He is triggered by his chronic nerve pain, financial losses and medical comorbidities. Patient is disgruntled and frustrated throughout the majority of the evaluation. He ruminates and perseverates about multiple psychosocial stressors with a large contribution being finances, lack of access, and underlying mood disorder.  Patient with feelings of hopelessness and despair, impaired judgement and lack of insight presents an acutely high safety risk and continues to be appropriate for inpatient psychiatric admission where he can be monitored for safety and stabilized on medications. He meets criteria for IVC which will be upheld at this time.    He was alert, oriented, and cooperative with interview. Continues to be much more open, future oriented - asked me today if candidate for CIR. Discussed followup plan at length including presenting to ED or urgent care if worsening of sx - he has ongoing concerns about med affordability but is happy that medicaid is pending. He spent the 68manniversary of his son's death with his daughter  reminiscing without re-experiencing grief process. Overall very in tune with current medical plan. Sleep is still a little artificial but good. Having some episodic RLS but overall benefits of meds > side effects. Noted to have ongoing broadening of affect, smiling and laughing at times.    Spoke to friend Nathaniel Warren He was aware that Nathaniel Warren being seen by psychiatry. Caretaker is 89omething years old, has been taking care of him for a year and a half. He has been generally laying at home, sick, refusing to take care of himself. He pays for most of pt's medications and needs out of his fixed income. He shares similar concerns about inability to get SSD. He does share that he was unable to pay for pt's mental health medications. He has mentioned suicide to AAuroraseveral times in the context of feeling like a burden and being better off dead. He has just now opened up to Nathaniel Capitanabout how he has been feeling. He had to force Nathaniel Warren come to the hospital to start with, he is aware that probably doesn't have long to live regardless of medical intervention. He hasn't seen much of an improvement in pt's mental health, thinks he is putting on a front for mental health workers. He does not want Nathaniel Warren leave the hospital until he is 100% stable because of how hard it was to get him to go to the hospital - had to threaten him with eviction. Friend asks about hospice. Nathaniel Hansenthinks a psych hospital would be helpful  but understands JFlossieis not medically stable to think about that. Nathaniel Warren  notes that Elizeo does take his medication and responds well when he can afford it. Has had at least 3 falls in the house.   2/28  Called and discussed rescinding IVC for inpt psych. Nathaniel Warren has ongoing concerns about pt's likelyhood of decompensation if his basic needs (food, healthcare) are not met, and some concerns that pt is putting on a good front, but all of these are more chronic than acute. He has not  endorsed suicidal ideations in last several days. Expresses general dissatisfaction with care (no one was called when pt rushed into surgery, pt has not seen a SW); validated these feelings as appropriate.    Past Psychiatric History: bipolar d/o vs mdd, gad, ptsd, alcohol use disorder.   Risk to Self:  no Risk to Others:  no Prior Inpatient Therapy:   Prior Outpatient Therapy:    Past Medical History:  Past Medical History:  Diagnosis Date   Depression    Hernia, abdominal    Liver failure ([place])    Seizures ([place])    Stroke [facility])     Past Surgical History:  Procedure Laterality Date   ANKLE SURGERY     ANKLE SURGERY Right    HERNIA REPAIR     INCISION AND DRAINAGE OF WOUND Left 02/03/2023   Procedure: IRRIGATION AND DEBRIDEMENT WOUND LEFT RING FINGER POSSIBLE AMPUTATION;  Surgeon: Orene Desanctis, MD;  Location: [facility];  Service: Orthopedics;  Laterality: Left;   SPLENECTOMY     Family History:  Family History  Problem Relation Age of Onset   COPD Mother    Diabetes Father    Stroke Maternal Grandmother    Alzheimer's disease Maternal Grandfather    Cancer Paternal Grandmother    Stomach cancer Paternal Grandfather    Hypertension Other    Colon cancer Neg Hx    Esophageal cancer Neg Hx    Colon polyps Neg Hx    Inflammatory bowel disease Neg Hx    Liver disease Neg Hx    Pancreatic cancer Neg Hx    Rectal cancer Neg Hx    Family Psychiatric  History: did not report   Social History:  Social History   Substance and Sexual Activity  Alcohol Use Not Currently     Social History   Substance and Sexual Activity  Drug Use Yes   Types: Marijuana    Social History   Socioeconomic History   Marital status: Single    Spouse name: Not on file   Number of children: 2   Years of education: 14   Highest education level: Not on file  Occupational History   Occupation: Nature conservation officer  Tobacco Use   Smoking status: Every Day    Packs/day: 1.00    Years:  30.00    Total pack years: 30.00    Types: Cigarettes   Smokeless tobacco: Former  Scientific laboratory technician Use: Never used  Substance and Sexual Activity   Alcohol use: Not Currently   Drug use: Yes    Types: Marijuana   Sexual activity: Yes    Comment:  wife had hysterectomy  Other Topics Concern   Not on file  Social History Narrative   ** Merged History Encounter **    Right handed   Unable to work   Lives with roommate and cousin   Drinks caffeine   One floor house   Social Determinants of Health   Financial Resource Strain: High Risk (07/15/2022)   Overall Financial Resource Strain (CARDIA)  Difficulty of Paying Living Expenses: Very hard  Food Insecurity: Food Insecurity Present (01/23/2023)   Hunger Vital Sign    Worried About Running Out of Food in the Last Year: Sometimes true    Ran Out of Food in the Last Year: Sometimes true  Transportation Needs: Unmet Transportation Needs (01/23/2023)   PRAPARE - Transportation    Lack of Transportation (Medical): Yes    Lack of Transportation (Non-Medical): Yes  Physical Activity: Inactive (07/15/2022)   Exercise Vital Sign    Days of Exercise per Week: 0 days    Minutes of Exercise per Session: 0 min  Stress: Stress Concern Present (07/15/2022)   [place]    Feeling of Stress : Very much  Social Connections: Socially Isolated (07/15/2022)   Social Connection and Isolation Panel [NHANES]    Frequency of Communication with Friends and Family: More than three times a week    Frequency of Social Gatherings with Friends and Family: Once a week    Attends Religious Services: Never    Marine scientist or Organizations: No    Attends Archivist Meetings: Never    Marital Status: Divorced   Additional Social History:    Allergies:  No Known Allergies  Labs:  Results for orders placed or performed during the hospital encounter of 01/22/23 (from the  past 48 hour(s))  Comprehensive metabolic panel     Status: Abnormal   Collection Time: 02/15/23  4:00 AM  Result Value Ref Range   Sodium 134 (L) 135 - 145 mmol/L    Comment: DELTA CHECK NOTED   Potassium 4.2 3.5 - 5.1 mmol/L   Chloride 100 98 - 111 mmol/L   CO2 28 22 - 32 mmol/L   Glucose, Bld 131 (H) 70 - 99 mg/dL    Comment: Glucose reference range applies only to samples taken after fasting for at least 8 hours.   BUN 24 (H) 6 - 20 mg/dL   Creatinine, Ser 1.33 (H) 0.61 - 1.24 mg/dL   Calcium 8.2 (L) 8.9 - 10.3 mg/dL   Total Protein 6.0 (L) 6.5 - 8.1 g/dL   Albumin 2.2 (L) 3.5 - 5.0 g/dL   AST 55 (H) 15 - 41 U/L   ALT 20 0 - 44 U/L   Alkaline Phosphatase 140 (H) 38 - 126 U/L   Total Bilirubin 6.5 (H) 0.3 - 1.2 mg/dL   GFR, Estimated >60 >60 mL/min    Comment: (NOTE) Calculated using the CKD-EPI Creatinine Equation (2021)    Anion gap 6 5 - 15    Comment: Performed at [facility] [place] [address] [place], [place] [id number]  Basic metabolic panel     Status: Abnormal   Collection Time: 02/16/23  3:44 AM  Result Value Ref Range   Sodium 132 (L) 135 - 145 mmol/L   Potassium 4.4 3.5 - 5.1 mmol/L   Chloride 100 98 - 111 mmol/L   CO2 25 22 - 32 mmol/L   Glucose, Bld 118 (H) 70 - 99 mg/dL    Comment: Glucose reference range applies only to samples taken after fasting for at least 8 hours.   BUN 21 (H) 6 - 20 mg/dL   Creatinine, Ser 0.80 0.61 - 1.24 mg/dL   Calcium 8.2 (L) 8.9 - 10.3 mg/dL   GFR, Estimated >60 >60 mL/min    Comment: (NOTE) Calculated using the CKD-EPI Creatinine Equation (2021)    Anion gap 7 5 -  15    Comment: Performed at [facility], [place] [address] [place], [place] [address]    Current Facility-Administered Medications  Medication Dose Route Frequency Provider Last Rate Last Admin   0.9 %  sodium chloride infusion  250 mL Intravenous PRN Toy Baker, MD       folic acid (FOLVITE) tablet 1 mg  1 mg Oral  Daily Doutova, Anastassia, MD   1 mg at 02/16/23 0905   [START ON 02/17/2023] furosemide (LASIX) tablet 40 mg  40 mg Oral Daily Adhikari, Tamsen Meek, MD       hydrocerin (EUCERIN) cream   Topical BID Patrecia Pour, MD   Given at 02/16/23 [phone]   morphine (PF) 2 MG/ML injection 2 mg  2 mg Intravenous Q3H PRN Toy Baker, MD   2 mg at 02/14/23 1832   ondansetron (ZOFRAN) injection 4 mg  4 mg Intravenous Q6H PRN Shelly Coss, MD   4 mg at 01/30/23 1038   Oral care mouth rinse  15 mL Mouth Rinse PRN Patrecia Pour, MD       oxyCODONE-acetaminophen (PERCOCET/ROXICET) 5-325 MG per tablet 2 tablet  2 tablet Oral Q4H PRN Nita Sells, MD   2 tablet at 02/16/23 [id number]   PARoxetine (PAXIL) tablet 10 mg  10 mg Oral Daily Missi Mcmackin A   10 mg at 02/15/23 2116   polyethylene glycol (MIRALAX / GLYCOLAX) packet 17 g  17 g Oral Daily Shelly Coss, MD   17 g at 02/08/23 [id number]   QUEtiapine (SEROQUEL) tablet 25 mg  25 mg Oral QHS Marella Vanderpol A   25 mg at 02/15/23 2116   risperiDONE (RISPERDAL M-TABS) disintegrating tablet 2 mg  2 mg Oral Q8H PRN Suella Broad, FNP       And   ziprasidone (GEODON) injection 20 mg  20 mg Intramuscular PRN Starkes-Perry, Gayland Curry, FNP       sodium chloride flush (NS) 0.9 % injection 3 mL  3 mL Intravenous Q12H Doutova, Anastassia, MD   3 mL at 02/16/23 0908   sodium chloride flush (NS) 0.9 % injection 3 mL  3 mL Intravenous PRN Doutova, Anastassia, MD       spironolactone (ALDACTONE) tablet 50 mg  50 mg Oral Daily Shelly Coss, MD   50 mg at 02/16/23 [id number]   thiamine (VITAMIN B1) tablet 100 mg  100 mg Oral Daily Toy Baker, MD   100 mg at 02/16/23 0906    Musculoskeletal: Strength & Muscle Tone: Laying in bed   Gait & Station: Laying in bed   Patient leans: Laying in bed     Psychiatric Specialty Exam:  Presentation  General Appearance:  Appropriate for Environment  Eye Contact: Good  Speech: Normal Rate  Speech  Volume: Normal  Handedness: Ambidextrous   Mood and Affect  Mood: -- (Good, apprehensive)  Affect: Appropriate; Congruent; Full Range   Thought Process  Thought Processes: Linear; Goal Directed; Coherent  Descriptions of Associations:Intact  Orientation:Full (Time, Place and Person)  Thought Content: Suicidal History of Schizophrenia/Schizoaffective disorder:No  Duration of Psychotic Symptoms:No data recorded Hallucinations: denies Ideas of Reference: denies Suicidal Thoughts: Yes, denies plan  Homicidal Thoughts: denies   Sensorium  Memory: Immediate Fair; Recent Fair; Remote Good  Judgment: Fair  Insight: Good   Executive Functions  Concentration: Fair  Attention Span: Good  Recall: Good  Fund of Knowledge: Good  Language: Good   Psychomotor Activity  Psychomotor Activity: Psychomotor Activity: Normal  restless, rocking back and forth   Assets  Assets: Communication Skills; Social Support   Sleep  Sleep: Fair Sleep: Good         Physical Exam: Minimal change since last exam Physical Exam Vitals reviewed.  HENT:     Head: Normocephalic and atraumatic.  Skin:    Coloration: Skin is jaundiced.  Neurological:     General: No focal deficit present.     Mental Status: He is alert and oriented to person, place, and time.  Psychiatric:        Attention and Perception: He does not perceive auditory or visual hallucinations.        Speech: Speech normal.        Behavior: Behavior is cooperative.        Thought Content: Thought content is not paranoid.        Judgment: Judgment is not impulsive.    Subjectively a little less jaundiced,   Review of Systems  Constitutional: Negative.   Musculoskeletal:        Weakness  Skin:  Positive for rash.   Blood pressure 112/65, pulse 63, temperature 98.2 F (36.8 C), temperature source Oral, resp. rate 18, height '5\' 9"'$  (1.753 m), weight 77.7 kg, SpO2 98 %. Body mass  index is 25.3 kg/m.  Treatment Plan Summary:  Assessment: -- likely bipolar II  -GAD -PTSD -Hx of Alcohol use disorder This is a patient with a history of bipolar disorder and multiple lifetime suicide attempts here with end stage cirrhosis and a hand hemotoma. Has made many suicidal statements early in course of hospitalization, and initially refused every medicaiton offered out of a sense of futility due to cost. Over the past several evaluations (since about 2/22), he has consented to paxil and quetiapine, and has been sleeping much better with attendant improvements in mood, affect, and motivation to participate in PT. He has some side effects (a couple of seconds of RLS like sx a few times a day) which warrant monitoring over changing medication (most other antipsychotics likely to be worse). We have addressed numerous risk factors for suicidality (insomnia, untreated depression) while medically admitted; he remains at chronic high risk due to psychiatirc history and terminal diagnosis which cannot be addressed either here or if he were to go to inpt psych. At this point, pt no longer meets criteria for IVC in [place], which requires pt be an acute risk to self, and it was rescinded on 2/28. Psychiatry continued to follow while admitted to provide further brief supportive psychotherapy, medication management (do not anticipate major future changes), and assistance in dc planning; pt has expressed gratitude for ongoing support.   Notably, will likely require lower doses of psychotropic meds which are mostly metabolized by CYP system in liver. Seems to be responding well to lower doses of medications than previous, no indication to increase at this time. We are signing off today in anticipation of dc tomorrow.   Plan: --  no need for  ivc, sitter  -- c paxil 10 mg -- c quetiapine 25 mg QHS - consult SW to talk with patient, he would like some direction/assistance in regards to accessing social  services   A1c 4.5 (good), lipid panel low HDL, high LDL, trig wnl - not eligible statin 2/2 cirrhosis per primary  Psychiatry will continue to follow.   Disposition: No evidence of imminent risk to self or others at present.   Patient does not meet criteria for psychiatric inpatient admission. Supportive therapy  provided about ongoing stressors. Discussed crisis plan, support from social network, calling 911, coming to the Emergency Department, and calling Suicide Hotline.  Kerrie Buffalo Fatou Dunnigan 02/16/2023 2:34 PM

## 2023-02-17 LAB — COMPREHENSIVE METABOLIC PANEL
ALT: 24 U/L (ref 0–44)
AST: 68 U/L — ABNORMAL HIGH (ref 15–41)
Albumin: 2.2 g/dL — ABNORMAL LOW (ref 3.5–5.0)
Alkaline Phosphatase: 165 U/L — ABNORMAL HIGH (ref 38–126)
Anion gap: 10 (ref 5–15)
BUN: 21 mg/dL — ABNORMAL HIGH (ref 6–20)
CO2: 26 mmol/L (ref 22–32)
Calcium: 8.4 mg/dL — ABNORMAL LOW (ref 8.9–10.3)
Chloride: 100 mmol/L (ref 98–111)
Creatinine, Ser: 1.09 mg/dL (ref 0.61–1.24)
GFR, Estimated: >60 mL/min (ref >60)
Glucose, Bld: 111 mg/dL — ABNORMAL HIGH (ref 70–99)
Potassium: 4.2 mmol/L (ref 3.5–5.1)
Sodium: 136 mmol/L (ref 135–145)
Total Bilirubin: 5.9 mg/dL — ABNORMAL HIGH (ref 0.3–1.2)
Total Protein: 6.3 g/dL — ABNORMAL LOW (ref 6.5–8.1)

## 2023-02-17 NOTE — NC FL2 (Addendum)
[place] LEVEL OF CARE FORM     IDENTIFICATION  Patient Name: Nathaniel Warren Birthdate: [birth year] Sex: male Admission Date (Current Location): 01/22/2023  [facility] and [place] Number:  Herbalist and Address:  [facility],  [place], [place]      Provider Number: [id number]  Attending Physician Name and Address:  Karie Kirks, DO  Relative Name and Phone Number:  Dameron Hughbanks [phone]    Current Level of Care: Hospital Recommended Level of Care: [place] Prior Approval Number:    Date Approved/Denied:   PASRR Number: [id number]  Discharge Plan: SNF    Current Diagnoses: Patient Active Problem List   Diagnosis Date Noted   Acute deep vein thrombosis (DVT) of brachial vein of left upper extremity ([place]) 02/21/2023   Pressure injury of skin 02/21/2023   Suicidal ideations 01/23/2023   Spontaneous hematoma of lower leg 01/22/2023   Tobacco abuse counseling 01/06/2023   Tooth pain 01/06/2023   Foot drop, right 12/06/2022   Nerve compression 12/04/2022   Hypoxia 12/04/2022   Ground glass opacity present on imaging of lung 12/04/2022   ABLA (acute blood loss anemia) 12/02/2022   Chest wall hematoma 12/02/2022   Chronic pain of right ankle 12/02/2022   Alcohol use disorder 08/08/2022   Ascites due to alcoholic cirrhosis ([place]) [id number]   Portal hypertension ([place]) 08/08/2022   Colon cancer screening 08/08/2022   Financial difficulties 08/08/2022   Posttraumatic stress disorder 07/15/2022   History of seizures 04/03/2022   Liver failure without hepatic coma ([place]) [id number]   Alcoholic cirrhosis of liver with ascites ([place]) 02/24/2022   Anasarca 02/21/2022   Hypokalemia 02/21/2022   Hyponatremia 02/21/2022   Elevated LFTs 02/21/2022   Hyperbilirubinemia 02/21/2022   Elevated brain natriuretic peptide (BNP) level 02/21/2022   Tobacco abuse 02/21/2022   Thrombocytopenia ([place]) 02/21/2022    Normocytic anemia 02/21/2022   Multiple lacunar infarcts ([place]) 10/21/2017   Drug overdose, intentional, initial encounter ([place]) 10/20/2017   Drug overdose, intentional self-harm, initial encounter ([place]) 10/20/2017   Seizures ([place]) 10/20/2017   Drug overdose 10/20/2017   Major depressive disorder, recurrent ([place]) 03/21/2016   Substance induced mood disorder ([place]) 03/20/2016   MDD (major depressive disorder), recurrent severe, without psychosis ([place]) 04/10/2013   GAD (generalized anxiety disorder) 04/10/2013   Anxiety 01/11/2013    Orientation RESPIRATION BLADDER Height & Weight     Self, Time, Situation, Place  Normal Continent Weight: 171 lb 4.8 oz (77.7 kg) Height:  5\' 9"  (175.3 cm)  BEHAVIORAL SYMPTOMS/MOOD NEUROLOGICAL BOWEL NUTRITION STATUS     (n/a) Continent Diet  AMBULATORY STATUS COMMUNICATION OF NEEDS Skin   Limited Assist Verbally Other (Comment) (stage 2 buttocks foam dressing left hand incision, laceration to left finger,)                       Personal Care Assistance Level of Assistance  Bathing, Feeding, Dressing Bathing Assistance: Limited assistance Feeding assistance: Independent Dressing Assistance: Limited assistance     Functional Limitations Info  Sight, Hearing, Speech Sight Info: Adequate Hearing Info: Adequate Speech Info: Adequate    SPECIAL CARE FACTORS FREQUENCY  PT (By licensed PT), OT (By licensed OT)     PT Frequency: 5x/wk OT Frequency: 5X/wk            Contractures Contractures Info: Not present    Additional Factors Info  Code Status, Allergies, Psychotropic, Insulin Sliding Scale, Isolation Precautions, Suctioning Needs  Code Status Info: Full Allergies Info: no known drug allergies Psychotropic Info: see discharge summary Insulin Sliding Scale Info: see discharge summary Isolation Precautions Info: see discharge summary Suctioning Needs: n/a   Current Medications (02/22/2023):  This is the current hospital active  medication list Current Facility-Administered Medications  Medication Dose Route Frequency Provider Last Rate Last Admin   0.9 %  sodium chloride infusion  250 mL Intravenous PRN Doutova, Anastassia, MD       enoxaparin (LOVENOX) injection 80 mg  80 mg Subcutaneous Q12H Swayze, Ava, DO   80 mg at [id number]   folic acid (FOLVITE) tablet 1 mg  1 mg Oral Daily Doutova, Anastassia, MD   1 mg at 02/22/23 0910   furosemide (LASIX) tablet 40 mg  40 mg Oral Daily Shelly Coss, MD   40 mg at 02/22/23 0910   hydrocerin (EUCERIN) cream   Topical BID Patrecia Pour, MD   Given at 02/22/23 [phone]   morphine (PF) 2 MG/ML injection 2 mg  2 mg Intravenous Q3H PRN Toy Baker, MD   2 mg at 02/21/23 1631   ondansetron (ZOFRAN) injection 4 mg  4 mg Intravenous Q6H PRN Shelly Coss, MD   4 mg at 01/30/23 1038   Oral care mouth rinse  15 mL Mouth Rinse PRN Patrecia Pour, MD       oxyCODONE-acetaminophen (PERCOCET/ROXICET) 5-325 MG per tablet 2 tablet  2 tablet Oral Q4H PRN Nita Sells, MD   2 tablet at 02/22/23 0910   PARoxetine (PAXIL) tablet 10 mg  10 mg Oral Daily Cinderella, Margaret A   10 mg at 02/21/23 2213   polyethylene glycol (MIRALAX / GLYCOLAX) packet 17 g  17 g Oral Daily Shelly Coss, MD   17 g at 02/20/23 0900   QUEtiapine (SEROQUEL) tablet 25 mg  25 mg Oral QHS Cinderella, Margaret A   25 mg at 02/21/23 2213   risperiDONE (RISPERDAL M-TABS) disintegrating tablet 2 mg  2 mg Oral Q8H PRN Suella Broad, FNP       And   ziprasidone (GEODON) injection 20 mg  20 mg Intramuscular PRN Starkes-Perry, Gayland Curry, FNP       sodium chloride flush (NS) 0.9 % injection 3 mL  3 mL Intravenous Q12H Doutova, Anastassia, MD   3 mL at 02/22/23 0910   sodium chloride flush (NS) 0.9 % injection 3 mL  3 mL Intravenous PRN Doutova, Anastassia, MD       spironolactone (ALDACTONE) tablet 50 mg  50 mg Oral Daily Shelly Coss, MD   50 mg at 02/22/23 0910   thiamine (VITAMIN B1) tablet 100 mg   100 mg Oral Daily Toy Baker, MD   100 mg at 02/22/23 [id number]     Discharge Medications: Please see discharge summary for a list of discharge medications.  Relevant Imaging Results:  Relevant Lab Results:   Additional Information SS# [id number]      [facility] health can offer a 2 week Letter of guarantee with no extensions).  Tytus Strahle, LCSW

## 2023-02-17 NOTE — Progress Notes (Signed)
PT Cancellation Note  Patient Details Name: Nathaniel Warren MRN: [id number] DOB: 1966/06/18   Cancelled Treatment:    Reason Eval/Treat Not Completed: Fatigue/lethargy limiting ability to participate (pt stated he's walked to the bathroom twice today and is very fatigued from that, he's not up to mobilizing again. Will follow.)   Philomena Doheny PT 02/17/2023  Acute Rehabilitation Services  Office [phone]

## 2023-02-17 NOTE — TOC Progression Note (Addendum)
Transition of Care [facility]) - Progression Note    Patient Details  Name: Nathaniel Warren MRN: [id number] Date of Birth: 10-31-1966  Transition of Care [facility]) CM/SW [place], RN Phone Number:[phone]  02/17/2023, 8:34 AM  Clinical Narrative:    TOC has received messages stating that this patient now has recommendation for SNF. CM will initiate SNF workup but with the understanding that barrier to placement is no insurance. TOC will consult with TOC supervisor.   CM has reviewed [facility] financial counselors notes. Per notes 2/29 patient medicaid application is pending. Patient has applied for medicaid disability 7 times and always denied. Patient does not  work and has no income.   [id number] TOC acknowledges new consult for SNF. TOC supervisor has given approval for 2 week LOG if placement can be found today. CM to initiate bed search.   [place] pending level 2, FL2 completed and faxed out.    Expected Discharge Plan: Psychiatric Hospital Barriers to Discharge: Continued Medical Work up  Expected Discharge Plan and Services In-house Referral: Clinical Social Work Discharge Planning Services: NA Post Acute Care Choice: NA Living arrangements for the past 2 months: Single Family Home                 DME Arranged: N/A DME Agency: NA                   Social Determinants of Health (SDOH) Interventions [place]: Food Insecurity Present (01/23/2023)  Housing: Medium Risk (01/23/2023)  Transportation Needs: Unmet Transportation Needs (01/23/2023)  Utilities: Not At Risk (01/23/2023)  Alcohol Screen: Medium Risk (10/27/2017)  Depression (PHQ2-9): High Risk (12/18/2022)  Financial Resource Strain: High Risk (07/15/2022)  Physical Activity: Inactive (07/15/2022)  Social Connections: Socially Isolated (07/15/2022)  Stress: Stress Concern Present (07/15/2022)  Tobacco Use: High Risk (02/04/2023)    Readmission Risk Interventions     No data to  display

## 2023-02-17 NOTE — Plan of Care (Signed)
Patient alert and oriented. Pain on left hand, medicated per MAR. Wound dressing clean and intact. VS checked.   Problem: Health Behavior/Discharge Planning: Goal: Ability to manage health-related needs will improve Outcome: Progressing   Problem: Clinical Measurements: Goal: Ability to maintain clinical measurements within normal limits will improve Outcome: Progressing Goal: Will remain free from infection Outcome: Progressing Goal: Diagnostic test results will improve Outcome: Progressing Goal: Respiratory complications will improve Outcome: Progressing Goal: Cardiovascular complication will be avoided Outcome: Progressing   Problem: Activity: Goal: Risk for activity intolerance will decrease Outcome: Progressing   Problem: Nutrition: Goal: Adequate nutrition will be maintained Outcome: Progressing   Problem: Coping: Goal: Level of anxiety will decrease Outcome: Progressing   Problem: Elimination: Goal: Will not experience complications related to bowel motility Outcome: Progressing   Problem: Pain Managment: Goal: General experience of comfort will improve Outcome: Progressing   Problem: Safety: Goal: Ability to remain free from injury will improve Outcome: Progressing   Problem: Skin Integrity: Goal: Risk for impaired skin integrity will decrease Outcome: Progressing

## 2023-02-17 NOTE — Progress Notes (Signed)
PROGRESS NOTE  Nathaniel Warren  [id number] DOB: 04-16-1966 DOA: 01/22/2023 PCP: [name], NP   Brief Narrative: Patient is a 57 year old male with history of alcohol abuse/cirrhosis of liver, depression, chronic right ankle pain, suicidal attempts presented to the emergency department with complaint of left groin swelling, right arm swelling.  Found to have hematoma of the left groin as well as swelling of the right hand consistent with hematoma.  CT hand showed diffuse swelling without any abscess.    Hand surgery  consulted,recommended conservative management.   Hospital course remarkable for persistent pain and swelling of the right hand,finding of necrotic left 4th finger tip s/p  left ring finger I and D and left dorsal hand abscess I&D.  PT/OT recommended SNF .  Prolonged hospital course. TOC following for placement,medically stable for dc  Assessment & Plan:  Principal Problem:   Spontaneous hematoma of lower leg Active Problems:   Alcoholic cirrhosis of liver with ascites (HCC)   MDD (major depressive disorder), recurrent severe, without psychosis ([place])   Hyponatremia   Tobacco abuse   Suicidal ideations  Right /left hand hematoma/necrotic left 4th finger tip: Hand surgery was consulted and was following.   No indication for surgery for right forearm hematoma .  Given few doses of vitamin K, FFP 2 units here. Follow-up CT scan of the right hand done on 2/25 without evidence of abscess or collection. He was also found to have worsening of the ulcer on the left fourth fingertip, appeared  necrotic, foul-smelling.  MRI of the finger didnot  not show any abscess or osteomyelitis. Also developed left hand swelling.s/p  left ring finger I and D and left dorsal hand abscess I&D. Cultures on 2/27 showed corynebacterium and Prevotella.  Initially started on vancomycin and zosyn , changed to Augmentin then transitioned to oritavancin x 1 on 3/3, plan for 1 week of Flagyl.  ID  was  following. Right hand hematoma has completely resolved.  Left hand wound has dressing,orthopedics recommends daily dressing change.recommend follow up with Dr Greta Doom in 2 weeks.He still has mild leucocytosis but improved. RN has  provided teaching on dressing  History of alcohol abuse/liver cirrhosis: Quit in 2023.  Elevated INR.  Given few days of vitamin K.  Has mild elevated liver enzymes along with bilirubin. Recommend to follow-up with GI/hepatology as an outpatient.Follows with [facility].  Has jaundice. On spironolactone, Lasix at home: continue .  Had some  worsening edema of the lower extremities,given a dose  of lasix IV 40 mg once on 3/11   Chronic pain syndrome: Has history of L5-S1 nerve compression with sciatica, chronic knee pain with foot drop.  Continue pain management, supportive care.    Spermatocele: Recommend ultrasound in 3 months   Macrocytic anemia/thrombocytopenia: This is secondary to chronic alcohol abuse, cirrhosis. continue thiamine /folic acid.  Hemoglobin stable.  Normal folate, vitamin B12 high   Hyperlipidemia: LDL of 155.  Not kept on statin due to liver cirrhosis   Suicidal attempt/PTSD: Psychiatry was following.  On paroxetine, quetiapine.  No longer requires IVC. No need for inpatient psychiatric admission as per psychiatry           Pressure Injury 02/09/23 Buttocks Left Stage 2 -  Partial thickness loss of dermis presenting as a shallow open injury with a red, pink wound bed without slough. (Active)  02/09/23 1710  Location: Buttocks  Location Orientation: Left  Staging: Stage 2 -  Partial thickness loss of dermis presenting as a  shallow open injury with a red, pink wound bed without slough.  Wound Description (Comments):   Present on Admission: No  Dressing Type Foam - Lift dressing to assess site every shift 02/16/23 0900    DVT prophylaxis:SCDs Start: 01/23/23 0025     Code Status: Full Code  Family Communication: None at the  bedside  Patient status: Inpatient  Patient is from :Home  Anticipated discharge to:SNF   Estimated DC date:whenever possible   Consultants: Hand surgery,ID  Procedures:I and D  Antimicrobials:  Anti-infectives (From admission, onward)    Start     Dose/Rate Route Frequency Ordered Stop   02/08/23 1100  Oritavancin Diphosphate (ORBACTIV) 1,200 mg in dextrose 5 % IVPB        1,200 mg 333.3 mL/hr over 180 Minutes Intravenous Once 02/08/23 0935 02/08/23 1425   02/08/23 1030  metroNIDAZOLE (FLAGYL) tablet 500 mg        500 mg Oral Every 12 hours 02/08/23 0935 02/14/23 2115   02/07/23 1745  amoxicillin-clavulanate (AUGMENTIN) 875-125 MG per tablet 1 tablet  Status:  Discontinued        1 tablet Oral Every 12 hours 02/07/23 1659 02/08/23 0935   02/06/23 2200  cefadroxil (DURICEF) capsule 500 mg  Status:  Discontinued        500 mg Oral 2 times daily 02/06/23 0939 02/06/23 0950   02/06/23 2200  cefadroxil (DURICEF) capsule 1,000 mg  Status:  Discontinued        1,000 mg Oral 2 times daily 02/06/23 0950 02/07/23 1652   02/02/23 0900  cefTRIAXone (ROCEPHIN) 2 g in sodium chloride 0.9 % 100 mL IVPB  Status:  Discontinued        2 g 200 mL/hr over 30 Minutes Intravenous Every 24 hours 02/01/23 1044 02/06/23 0938   02/01/23 1200  cefTRIAXone (ROCEPHIN) 1 g in sodium chloride 0.9 % 100 mL IVPB  Status:  Discontinued        1 g 200 mL/hr over 30 Minutes Intravenous  Once 02/01/23 1101 02/01/23 1150   01/31/23 2200  vancomycin (VANCOCIN) IVPB 1000 mg/200 mL premix  Status:  Discontinued        1,000 mg 200 mL/hr over 60 Minutes Intravenous Every 12 hours 01/31/23 0840 02/06/23 0938   01/31/23 0900  cefTRIAXone (ROCEPHIN) 1 g in sodium chloride 0.9 % 100 mL IVPB  Status:  Discontinued        1 g 200 mL/hr over 30 Minutes Intravenous Every 24 hours 01/31/23 0811 02/01/23 1044   01/31/23 0900  vancomycin (VANCOREADY) IVPB 1500 mg/300 mL        1,500 mg 150 mL/hr over 120 Minutes Intravenous   Once 01/31/23 0840 01/31/23 1336       Subjective: Patient seen and examined at bedside today.  Hemodynamically stable.  Lower extremity edema look much better today.  He still complains of pain on the left hand, dressing on left hand being continued .  Appears overall comfortable without any distress  Objective: Vitals:   02/15/23 0600 02/15/23 1412 02/15/23 2131 02/16/23 0623  BP: (!) 114/51 (!) 119/52 (!) 107/51 (!) 124/55  Pulse: 63 65 61 66  Resp: '17 16 18 18  '$ Temp: 98.2 F (36.8 C) 98.9 F (37.2 C) 98.6 F (37 C) 98.2 F (36.8 C)  TempSrc: Oral  Oral Oral  SpO2: 97% 96% 97% 98%  Weight:      Height:        Intake/Output Summary (Last 24 hours) at 02/16/2023  1310 Last data filed at 02/16/2023 0600 Gross per 24 hour  Intake 812.42 ml  Output --  Net 812.42 ml   Filed Weights   01/23/23 2007  Weight: 77.7 kg    Examination:  General exam: Overall comfortable, not in distress,icteric HEENT: PERRL Respiratory system:  no wheezes or crackles  Cardiovascular system: S1 & S2 heard, RRR.  Gastrointestinal system: Abdomen is nondistended, soft and nontender. Central nervous system: Alert and oriented Extremities: trace lower extremity edema, no clubbing ,no cyanosis, dressing on left hand Skin: icterus , scattered purpura/ecchymosis   Data Reviewed: I have personally reviewed following labs and imaging studies  CBC: Recent Labs  Lab 02/10/23 0407 02/11/23 0418 02/12/23 0422 02/14/23 0416  WBC 13.3* 13.1* 12.6* 11.8*  HGB 8.4* 8.7* 8.3* 8.4*  HCT 24.9* 25.0* 24.1* 24.3*  MCV 108.7* 106.8* 108.6* 106.6*  PLT 111* 109* 118* 123XX123*   Basic Metabolic Panel: Recent Labs  Lab 02/11/23 0418 02/12/23 0422 02/14/23 0416 02/15/23 0400 02/16/23 0344  NA 133* 134* 127* 134* 132*  K 4.1 4.1 3.9 4.2 4.4  CL 103 102 97* 100 100  CO2 '25 26 24 28 25  '$ GLUCOSE 104* 110* 91 131* 118*  BUN 21* 23* 19 24* 21*  CREATININE 1.10 1.18 1.12 1.33* 0.80  CALCIUM 8.2* 8.1*  7.8* 8.2* 8.2*     No results found for this or any previous visit (from the past 240 hour(s)).    Radiology Studies: No results found.  Scheduled Meds:  folic acid  1 mg Oral Daily   hydrocerin   Topical BID   PARoxetine  10 mg Oral Daily   polyethylene glycol  17 g Oral Daily   QUEtiapine  25 mg Oral QHS   sodium chloride flush  3 mL Intravenous Q12H   spironolactone  50 mg Oral Daily   thiamine  100 mg Oral Daily   Continuous Infusions:  sodium chloride       LOS: 24 days   Shelly Coss, MD [facility] P3/10/2023, 1:10 PM

## 2023-02-17 NOTE — Plan of Care (Signed)

## 2023-02-18 LAB — FUNGUS CULTURE WITH STAIN

## 2023-02-18 LAB — FUNGUS CULTURE RESULT

## 2023-02-18 LAB — FUNGAL ORGANISM REFLEX

## 2023-02-18 NOTE — TOC Progression Note (Signed)
Transition of Care [facility]) - Progression Note    Patient Details  Name: Nathaniel Warren MRN: [id number] Date of Birth: Oct 01, 1966  Transition of Care [facility]) CM/SW [place], RN Phone Number:[phone]  02/18/2023, 12:53 PM  Clinical Narrative:    To whom it may concern: Please be advised that the above- named patient will require a short- term nursing home stay- anticipated 30 days or less for rehabilitation and  Strengthening. The plan is to return home.     Expected Discharge Plan: Psychiatric Hospital Barriers to Discharge: Continued Medical Work up  Expected Discharge Plan and Services In-house Referral: Clinical Social Work Discharge Planning Services: NA Post Acute Care Choice: NA Living arrangements for the past 2 months: Single Family Home                 DME Arranged: N/A DME Agency: NA                   Social Determinants of Health (SDOH) Interventions [place]: Food Insecurity Present (01/23/2023)  Housing: Medium Risk (01/23/2023)  Transportation Needs: Unmet Transportation Needs (01/23/2023)  Utilities: Not At Risk (01/23/2023)  Alcohol Screen: Medium Risk (10/27/2017)  Depression (PHQ2-9): High Risk (12/18/2022)  Financial Resource Strain: High Risk (07/15/2022)  Physical Activity: Inactive (07/15/2022)  Social Connections: Socially Isolated (07/15/2022)  Stress: Stress Concern Present (07/15/2022)  Tobacco Use: High Risk (02/04/2023)    Readmission Risk Interventions     No data to display

## 2023-02-18 NOTE — Plan of Care (Signed)

## 2023-02-18 NOTE — Progress Notes (Signed)
Physical Therapy Treatment Patient Details Name: Nathaniel Warren MRN: [id number] DOB: 1966/10/05 Today's Date: 02/18/2023   History of Present Illness 57 yo male with onset of groin and RT dorsal hand bruising and edema with reported light trauma during a fall.  I&D to LT D4 on 02/03/23. Has h/o R foot drop without AFO. + for suicidal ideations.   PMHx:  etoh abuse, depression with h/o 4 suicide attempts, stroke, seizures, liver failure, R ankle fracture with ORIF, atherosclerosis, cirrhosis, diverticulosis, osteopenia.    PT Comments    Pt is AxO x 3 pleasant and motivated.  Assisted OOB to amb went well.  General transfer comment: self able to rise with increased time from elevated bed.  Using R UE to "push up" and L LE > R LE strength.  R LE present with increased edema. General Gait Details: applied B shoes.  Tolerated an increased distance in hallway but with heavy lean on LEFT platform walker due to c/o R LE weakness, edema and foot pain "deep/middle/plantar aspect" esp with weight bearing.  "feels like a ball" under my foot. Assisted back to bed per pt request(comfort).   LPT rec SNF.  Difficult placement.  Recommendations for follow up therapy are one component of a multi-disciplinary discharge planning process, led by the attending physician.  Recommendations may be updated based on patient status, additional functional criteria and insurance authorization.  Follow Up Recommendations  Skilled nursing-short term rehab (<3 hours/day) Can patient physically be transported by private vehicle: No   Assistance Recommended at Discharge Intermittent Supervision/Assistance  Patient can return home with the following A little help with walking and/or transfers;Direct supervision/assist for medications management;Assistance with cooking/housework;Assist for transportation;A little help with bathing/dressing/bathroom;Help with stairs or ramp for entrance   Equipment Recommendations  Rolling walker  (2 wheels);Other (comment) (LEFT platform)    Recommendations for Other Services       Precautions / Restrictions Precautions Precautions: Fall Precaution Comments: R foot drop, does better with his shoes on (less ankle roll) Required Braces or Orthoses: Splint/Cast Splint/Cast: LT ring finger Restrictions Weight Bearing Restrictions: No Other Position/Activity Restrictions: Limited LT hand WB since I&D to D4     Mobility  Bed Mobility Overal bed mobility: Modified Independent             General bed mobility comments: self able with increased time    Transfers Overall transfer level: Needs assistance Equipment used: Left platform walker Transfers: Sit to/from Stand Sit to Stand: Min guard, From elevated surface           General transfer comment: self able to rise with increased time from elevated bed.  Using R UE to "push up" and L LE > R LE strength.  R LE present with increased edema.    Ambulation/Gait Ambulation/Gait assistance: Supervision, Min guard Gait Distance (Feet): 45 Feet Assistive device: Left platform walker, Rolling walker (2 wheels) Gait Pattern/deviations: Step-through pattern, Steppage, Narrow base of support Gait velocity: decreased     General Gait Details: applied B shoes.  Tolerated an increased distance in hallway but with heavy lean on LEFT platform walker due to c/o R LE weakness, edema and foot pain "deep/middle/plantar aspect" esp with weight bearing.  "feels like a ball" under my foot.   Stairs             Wheelchair Mobility    Modified Rankin (Stroke Patients Only)       Balance  Cognition Arousal/Alertness: Awake/alert Behavior During Therapy: WFL for tasks assessed/performed                                   General Comments: Very pleasant, motivated        Exercises      General Comments        Pertinent Vitals/Pain Pain  Assessment Pain Assessment: Faces Faces Pain Scale: Hurts a little bit Pain Location: right hand palmar surface and deep in midfoot of right foot (also edematous right leg/foot) Pain Descriptors / Indicators: Grimacing, Sore, Tender, Guarding Pain Intervention(s): Monitored during session, Repositioned    Home Living                          Prior Function            PT Goals (current goals can now be found in the care plan section) Progress towards PT goals: Progressing toward goals    Frequency    Min 3X/week      PT Plan Discharge plan needs to be updated    Co-evaluation              AM-PAC PT "6 Clicks" Mobility   Outcome Measure  Help needed turning from your back to your side while in a flat bed without using bedrails?: None Help needed moving from lying on your back to sitting on the side of a flat bed without using bedrails?: A Little Help needed moving to and from a bed to a chair (including a wheelchair)?: A Little Help needed standing up from a chair using your arms (e.g., wheelchair or bedside chair)?: A Little Help needed to walk in hospital room?: A Lot Help needed climbing 3-5 steps with a railing? : Total 6 Click Score: 16    End of Session Equipment Utilized During Treatment: Gait belt Activity Tolerance: Patient tolerated treatment well Patient left: in bed;with call bell/phone within reach;with bed alarm set Nurse Communication: Mobility status PT Visit Diagnosis: Muscle weakness (generalized) (M62.81);Other abnormalities of gait and mobility (R26.89);History of falling (Z91.81)     Time: [id number] PT Time Calculation (min) (ACUTE ONLY): 24 min  Charges:  $Gait Training: 8-22 mins $Therapeutic Activity: 8-22 mins                     Rica Koyanagi  PTA [place] Office M-F          [phone] Weekend pager [phone]

## 2023-02-18 NOTE — TOC Progression Note (Signed)
Transition of Care [facility]) - Progression Note    Patient Details  Name: Nathaniel Warren MRN: [id number] Date of Birth: 11-09-1966  Transition of Care [facility]) CM/SW [place], RN Phone Number:[phone]  02/18/2023, 3:00 PM  Clinical Narrative:    Additional documentation needed for PASRR. Information has been uploaded.    Expected Discharge Plan: Psychiatric Hospital Barriers to Discharge: Continued Medical Work up  Expected Discharge Plan and Services In-house Referral: Clinical Social Work Discharge Planning Services: NA Post Acute Care Choice: NA Living arrangements for the past 2 months: Single Family Home                 DME Arranged: N/A DME Agency: NA                   Social Determinants of Health (SDOH) Interventions [place]: Food Insecurity Present (01/23/2023)  Housing: Medium Risk (01/23/2023)  Transportation Needs: Unmet Transportation Needs (01/23/2023)  Utilities: Not At Risk (01/23/2023)  Alcohol Screen: Medium Risk (10/27/2017)  Depression (PHQ2-9): High Risk (12/18/2022)  Financial Resource Strain: High Risk (07/15/2022)  Physical Activity: Inactive (07/15/2022)  Social Connections: Socially Isolated (07/15/2022)  Stress: Stress Concern Present (07/15/2022)  Tobacco Use: High Risk (02/04/2023)    Readmission Risk Interventions     No data to display

## 2023-02-18 NOTE — Progress Notes (Signed)
PROGRESS NOTE  Nathaniel Warren  [id number] DOB: [birth year] DOA: 01/22/2023 PCP: [name], NP   Brief Narrative: Patient is a 57 year old male with history of alcohol abuse/cirrhosis of liver, depression, chronic right ankle pain, suicidal attempts presented to the emergency department with complaint of left groin swelling, right arm swelling.  Found to have hematoma of the left groin as well as swelling of the right hand consistent with hematoma.  CT hand showed diffuse swelling without any abscess.    Hand surgery  consulted,recommended conservative management.   Hospital course remarkable for persistent pain and swelling of the right hand,finding of necrotic left 4th finger tip s/p  left ring finger I and D and left dorsal hand abscess I&D.  PT/OT recommended SNF .  Prolonged hospital course. TOC following for placement,medically stable for dc  Assessment & Plan:  Principal Problem:   Spontaneous hematoma of lower leg Active Problems:   Alcoholic cirrhosis of liver with ascites (HCC)   MDD (major depressive disorder), recurrent severe, without psychosis ([place])   Hyponatremia   Tobacco abuse   Suicidal ideations  Right /left hand hematoma/necrotic left 4th finger tip: Hand surgery was consulted and was following.   No indication for surgery for right forearm hematoma .  Given few doses of vitamin K, FFP 2 units here. Follow-up CT scan of the right hand done on 2/25 without evidence of abscess or collection. He was also found to have worsening of the ulcer on the left fourth fingertip, appeared  necrotic, foul-smelling.  MRI of the finger didnot  not show any abscess or osteomyelitis. Also developed left hand swelling.s/p  left ring finger I and D and left dorsal hand abscess I&D. Cultures on 2/27 showed corynebacterium and Prevotella.  Initially started on vancomycin and zosyn , changed to Augmentin then transitioned to oritavancin x 1 on 3/3, plan for 1 week of Flagyl.  ID  was  following. Right hand hematoma has completely resolved.  Left hand wound has dressing,orthopedics recommends daily dressing change.recommend follow up with Dr Greta Doom in 2 weeks.He still has mild leucocytosis but improved. RN has  provided teaching on dressing  History of alcohol abuse/liver cirrhosis: Quit in 2023.  Elevated INR.  Given few days of vitamin K.  Has mild elevated liver enzymes along with bilirubin. Recommend to follow-up with GI/hepatology as an outpatient.Follows with [facility].  Has jaundice. On spironolactone, Lasix at home: continue .  Had some  worsening edema of the lower extremities,given a dose  of lasix IV 40 mg once on 3/11   Chronic pain syndrome: Has history of L5-S1 nerve compression with sciatica, chronic knee pain with foot drop.  Continue pain management, supportive care.    Spermatocele: Recommend ultrasound in 3 months   Macrocytic anemia/thrombocytopenia: This is secondary to chronic alcohol abuse, cirrhosis. continue thiamine /folic acid.  Hemoglobin stable.  Normal folate, vitamin B12 high   Hyperlipidemia: LDL of 155.  Not kept on statin due to liver cirrhosis   Suicidal attempt/PTSD: Psychiatry was following.  On paroxetine, quetiapine.  No longer requires IVC. No need for inpatient psychiatric admission as per psychiatry           Pressure Injury 02/09/23 Buttocks Left Stage 2 -  Partial thickness loss of dermis presenting as a shallow open injury with a red, pink wound bed without slough. (Active)  02/09/23 1710  Location: Buttocks  Location Orientation: Left  Staging: Stage 2 -  Partial thickness loss of dermis presenting as a  shallow open injury with a red, pink wound bed without slough.  Wound Description (Comments):   Present on Admission: No  Dressing Type Foam - Lift dressing to assess site every shift 02/17/23 0750    DVT prophylaxis:SCDs Start: 01/23/23 0025     Code Status: Full Code  Family Communication: None at the  bedside  Patient status: Inpatient  Patient is from :Home  Anticipated discharge to:SNF   Estimated DC date:whenever possible   Consultants: Hand surgery,ID  Procedures:I and D  Antimicrobials:  Anti-infectives (From admission, onward)    Start     Dose/Rate Route Frequency Ordered Stop   02/08/23 1100  Oritavancin Diphosphate (ORBACTIV) 1,200 mg in dextrose 5 % IVPB        1,200 mg 333.3 mL/hr over 180 Minutes Intravenous Once 02/08/23 0935 02/08/23 1425   02/08/23 1030  metroNIDAZOLE (FLAGYL) tablet 500 mg        500 mg Oral Every 12 hours 02/08/23 0935 02/14/23 2115   02/07/23 1745  amoxicillin-clavulanate (AUGMENTIN) 875-125 MG per tablet 1 tablet  Status:  Discontinued        1 tablet Oral Every 12 hours 02/07/23 1659 02/08/23 0935   02/06/23 2200  cefadroxil (DURICEF) capsule 500 mg  Status:  Discontinued        500 mg Oral 2 times daily 02/06/23 0939 02/06/23 0950   02/06/23 2200  cefadroxil (DURICEF) capsule 1,000 mg  Status:  Discontinued        1,000 mg Oral 2 times daily 02/06/23 0950 02/07/23 1652   02/02/23 0900  cefTRIAXone (ROCEPHIN) 2 g in sodium chloride 0.9 % 100 mL IVPB  Status:  Discontinued        2 g 200 mL/hr over 30 Minutes Intravenous Every 24 hours 02/01/23 1044 02/06/23 0938   02/01/23 1200  cefTRIAXone (ROCEPHIN) 1 g in sodium chloride 0.9 % 100 mL IVPB  Status:  Discontinued        1 g 200 mL/hr over 30 Minutes Intravenous  Once 02/01/23 1101 02/01/23 1150   01/31/23 2200  vancomycin (VANCOCIN) IVPB 1000 mg/200 mL premix  Status:  Discontinued        1,000 mg 200 mL/hr over 60 Minutes Intravenous Every 12 hours 01/31/23 0840 02/06/23 0938   01/31/23 0900  cefTRIAXone (ROCEPHIN) 1 g in sodium chloride 0.9 % 100 mL IVPB  Status:  Discontinued        1 g 200 mL/hr over 30 Minutes Intravenous Every 24 hours 01/31/23 0811 02/01/23 1044   01/31/23 0900  vancomycin (VANCOREADY) IVPB 1500 mg/300 mL        1,500 mg 150 mL/hr over 120 Minutes Intravenous   Once 01/31/23 0840 01/31/23 1336       Subjective: Patient seen and examined at bedside today.  Comfortable.  Eating his breakfast.  Denies any new complaints except for some pain on the left hand.  Objective: Vitals:   02/17/23 0355 02/17/23 1229 02/17/23 1918 02/18/23 0503  BP: (!) 104/57 (!) 113/55 (!) 106/56 112/61  Pulse: 62 61 (!) 59 62  Resp: '17 17 17 15  '$ Temp: 98.2 F (36.8 C) 98.4 F (36.9 C) 98.4 F (36.9 C) 98.9 F (37.2 C)  TempSrc: Oral     SpO2: 95% 99% 97% 98%  Weight:      Height:        Intake/Output Summary (Last 24 hours) at 02/18/2023 1248 Last data filed at 02/17/2023 1800 Gross per 24 hour  Intake --  Output  400 ml  Net -400 ml   Filed Weights   01/23/23 2007  Weight: 77.7 kg    Examination:  General exam: Overall comfortable, not in distress,icteric HEENT: PERRL Respiratory system:  no wheezes or crackles  Cardiovascular system: S1 & S2 heard, RRR.  Gastrointestinal system: Abdomen is nondistended, soft and nontender. Central nervous system: Alert and oriented Extremities:trace lower extremity edema, no clubbing ,no cyanosis, dressing on the left hand Skin: Scattered ecchymosis, icterus  Data Reviewed: I have personally reviewed following labs and imaging studies  CBC: Recent Labs  Lab 02/12/23 0422 02/14/23 0416  WBC 12.6* 11.8*  HGB 8.3* 8.4*  HCT 24.1* 24.3*  MCV 108.6* 106.6*  PLT 118* 123XX123*   Basic Metabolic Panel: Recent Labs  Lab 02/12/23 0422 02/14/23 0416 02/15/23 0400 02/16/23 0344 02/17/23 0353  NA 134* 127* 134* 132* 136  K 4.1 3.9 4.2 4.4 4.2  CL 102 97* 100 100 100  CO2 '26 24 28 25 26  '$ GLUCOSE 110* 91 131* 118* 111*  BUN 23* 19 24* 21* 21*  CREATININE 1.18 1.12 1.33* 0.80 1.09  CALCIUM 8.1* 7.8* 8.2* 8.2* 8.4*     No results found for this or any previous visit (from the past 240 hour(s)).    Radiology Studies: No results found.  Scheduled Meds:  folic acid  1 mg Oral Daily   furosemide  40 mg  Oral Daily   hydrocerin   Topical BID   PARoxetine  10 mg Oral Daily   polyethylene glycol  17 g Oral Daily   QUEtiapine  25 mg Oral QHS   sodium chloride flush  3 mL Intravenous Q12H   spironolactone  50 mg Oral Daily   thiamine  100 mg Oral Daily   Continuous Infusions:  sodium chloride       LOS: 26 days   Nathaniel Coss, MD [facility] P3/13/2024, 12:48 PM

## 2023-02-19 ENCOUNTER — Inpatient Hospital Stay (HOSPITAL_COMMUNITY): Payer: Medicaid Other

## 2023-02-19 DIAGNOSIS — R2232 Localized swelling, mass and lump, left upper limb: Secondary | ICD-10-CM

## 2023-02-19 LAB — CBC
HCT: 26.3 % — ABNORMAL LOW (ref 39.0–52.0)
Hemoglobin: 9 g/dL — ABNORMAL LOW (ref 13.0–17.0)
MCH: 37.3 pg — ABNORMAL HIGH (ref 26.0–34.0)
MCHC: 34.2 g/dL (ref 30.0–36.0)
MCV: 109.1 fL — ABNORMAL HIGH (ref 80.0–100.0)
Platelets: 117 10*3/uL — ABNORMAL LOW (ref 150–400)
RBC: 2.41 MIL/uL — ABNORMAL LOW (ref 4.22–5.81)
RDW: 19.3 % — ABNORMAL HIGH (ref 11.5–15.5)
WBC: 9.2 10*3/uL (ref 4.0–10.5)
nRBC: 0 % (ref 0.0–0.2)

## 2023-02-19 LAB — COMPREHENSIVE METABOLIC PANEL
ALT: 22 U/L (ref 0–44)
AST: 73 U/L — ABNORMAL HIGH (ref 15–41)
Albumin: 2.2 g/dL — ABNORMAL LOW (ref 3.5–5.0)
Alkaline Phosphatase: 137 U/L — ABNORMAL HIGH (ref 38–126)
Anion gap: 13 (ref 5–15)
BUN: 18 mg/dL (ref 6–20)
CO2: 21 mmol/L — ABNORMAL LOW (ref 22–32)
Calcium: 8.2 mg/dL — ABNORMAL LOW (ref 8.9–10.3)
Chloride: 103 mmol/L (ref 98–111)
Creatinine, Ser: 0.89 mg/dL (ref 0.61–1.24)
GFR, Estimated: >60 mL/min (ref >60)
Glucose, Bld: 118 mg/dL — ABNORMAL HIGH (ref 70–99)
Potassium: 4.5 mmol/L (ref 3.5–5.1)
Sodium: 137 mmol/L (ref 135–145)
Total Bilirubin: 6.1 mg/dL — ABNORMAL HIGH (ref 0.3–1.2)
Total Protein: 6.1 g/dL — ABNORMAL LOW (ref 6.5–8.1)

## 2023-02-19 LAB — PROTIME-INR
INR: 1.9 — ABNORMAL HIGH (ref 0.8–1.2)
Prothrombin Time: 21.2 seconds — ABNORMAL HIGH (ref 11.4–15.2)

## 2023-02-19 MED ORDER — APIXABAN 5 MG PO TABS
10.0000 mg | ORAL_TABLET | Freq: Two times a day (BID) | ORAL | Status: DC
  Administered 2023-02-19 – 2023-02-21 (×4): 10 mg via ORAL
  Filled 2023-02-19 (×4): qty 2

## 2023-02-19 MED ORDER — APIXABAN 5 MG PO TABS: 5.0000 mg | ORAL_TABLET | Freq: Two times a day (BID) | ORAL | Status: DC

## 2023-02-19 NOTE — Progress Notes (Signed)
[place] Note   Patient Details  Name: Nathaniel Warren Date of Birth: 04/24/1966   Transition of Care [facility]) CM/SW Contact:    Vassie Moselle, LCSW Phone Number: 02/19/2023, 12:59 PM  To Whom It May Concern:  Please be advised that this patient will require a short-term nursing home stay - anticipated 30 days or less for rehabilitation and strengthening.   The plan is for return home.

## 2023-02-19 NOTE — TOC Progression Note (Signed)
Transition of Care [facility]) - Progression Note    Patient Details  Name: Nathaniel Warren MRN: [id number] Date of Birth: Jul 18, 1966  Transition of Care [facility]) CM/SW [place], LCSW Phone Number: 02/19/2023, 2:06 PM  Clinical Narrative:    [facility] has not received updated records.  Requested records have been faxed to [place] for review.  Pt currently has no bed offers for SNF.    Expected Discharge Plan: Psychiatric Hospital Barriers to Discharge: Continued Medical Work up  Expected Discharge Plan and Services In-house Referral: Clinical Social Work Discharge Planning Services: NA Post Acute Care Choice: NA Living arrangements for the past 2 months: Single Family Home                 DME Arranged: N/A DME Agency: NA                   Social Determinants of Health (SDOH) Interventions [place]: Food Insecurity Present (01/23/2023)  Housing: Medium Risk (01/23/2023)  Transportation Needs: Unmet Transportation Needs (01/23/2023)  Utilities: Not At Risk (01/23/2023)  Alcohol Screen: Medium Risk (10/27/2017)  Depression (PHQ2-9): High Risk (12/18/2022)  Financial Resource Strain: High Risk (07/15/2022)  Physical Activity: Inactive (07/15/2022)  Social Connections: Socially Isolated (07/15/2022)  Stress: Stress Concern Present (07/15/2022)  Tobacco Use: High Risk (02/04/2023)    Readmission Risk Interventions     No data to display

## 2023-02-19 NOTE — Progress Notes (Addendum)
PROGRESS NOTE  ELYJIAH BOROWSKY  [id number] DOB: [birth year] DOA: 01/22/2023 PCP: [name], NP   Brief Narrative: Patient is a 57 year old male with history of alcohol abuse/cirrhosis of liver, depression, chronic right ankle pain, suicidal attempts presented to the emergency department with complaint of left groin swelling, right arm swelling.  Found to have hematoma of the left groin as well as swelling of the right hand consistent with hematoma.  CT hand showed diffuse swelling without any abscess.    Hand surgery  consulted,recommended conservative management.   Hospital course remarkable for persistent pain and swelling of the right hand,finding of necrotic left 4th finger tip s/p  left ring finger I and D and left dorsal hand abscess I&D.  PT/OT recommended SNF .  Prolonged hospital course. TOC following for placement,medically stable for dc  Assessment & Plan:  Principal Problem:   Spontaneous hematoma of lower leg Active Problems:   Alcoholic cirrhosis of liver with ascites (HCC)   MDD (major depressive disorder), recurrent severe, without psychosis ([place])   Hyponatremia   Tobacco abuse   Suicidal ideations  Right /left hand hematoma/necrotic left 4th finger tip: Hand surgery was consulted and was following.   No indication for surgery for right forearm hematoma .  Given few doses of vitamin K, FFP 2 units here. Follow-up CT scan of the right hand done on 2/25 without evidence of abscess or collection. He was also found to have worsening of the ulcer on the left fourth fingertip, appeared  necrotic, foul-smelling.  MRI of the finger didnot  not show any abscess or osteomyelitis. Also developed left hand swelling.s/p  left ring finger I and D and left dorsal hand abscess I&D. Cultures on 2/27 showed corynebacterium and Prevotella.  Initially started on vancomycin and zosyn , changed to Augmentin then transitioned to oritavancin x 1 on 3/3, plan for 1 week of Flagyl.  ID  was  following. Right hand hematoma has completely resolved.  Left hand wound has dressing,orthopedics recommends daily dressing change.recommend follow up with Dr Greta Doom in 2 weeks.He still has mild leucocytosis but improved. RN has  provided teaching on dressing. Has some edema of the left forearm, ordered venous Doppler to rule out DVT, most likely associated with IV line.  History of alcohol abuse/liver cirrhosis: Quit in 2023.  Elevated INR.  Given few days of vitamin K.  Has mild elevated liver enzymes along with bilirubin. Recommend to follow-up with GI/hepatology as an outpatient.Follows with [facility].  Has jaundice. On spironolactone, Lasix at home: continue .  Had some  worsening edema of the lower extremities,given a dose  of lasix IV 40 mg once on 3/11.Edema better   Chronic pain syndrome: Has history of L5-S1 nerve compression with sciatica, chronic knee pain with foot drop.  Continue pain management, supportive care.    Spermatocele: Recommend ultrasound in 3 months   Macrocytic anemia/thrombocytopenia: This is secondary to chronic alcohol abuse, cirrhosis. continue thiamine /folic acid.  Hemoglobin stable.  Normal folate, vitamin B12 high   Hyperlipidemia: LDL of 155.  Not kept on statin due to liver cirrhosis   Suicidal attempt/PTSD: Psychiatry was following.  On paroxetine, quetiapine.  No longer requires IVC. No need for inpatient psychiatric admission as per psychiatry      Addendum:  Patient found to have acute DVT involving the left brachial veins.Difficult situation to decide on starting anticoagulation given his liver disease,anemia/thrombocytopenia,elevated INR,recent hematoma of right upper extremity. I called and discussed extensively with Mr Haggart  and about this finding,discussed the risks and benefit of starting  anticoagulation . He understands that being on blood thinners will keep him in risk of bleeding ,redevelopment of hematoma .But he  also wants to prevent  potential pulmonary embolism.He want to be anticoagulated  and understands that we can stop it immediately if we see any signs of bleeding.I also offered to call his daughter about this decision,but he says no need to call and he fully understands the situation. For now plan to continue eliquis for 3 months,but needs to be stopped immediately with signs of bleeding         Pressure Injury 02/09/23 Buttocks Left Stage 2 -  Partial thickness loss of dermis presenting as a shallow open injury with a red, pink wound bed without slough. (Active)  02/09/23 1710  Location: Buttocks  Location Orientation: Left  Staging: Stage 2 -  Partial thickness loss of dermis presenting as a shallow open injury with a red, pink wound bed without slough.  Wound Description (Comments):   Present on Admission: No  Dressing Type Foam - Lift dressing to assess site every shift 02/17/23 0750    DVT prophylaxis:SCDs Start: 01/23/23 0025     Code Status: Full Code  Family Communication: None at the bedside  Patient status: Inpatient  Patient is from :Home  Anticipated discharge to:SNF   Estimated DC date:whenever possible   Consultants: Hand surgery,ID  Procedures:I and D  Antimicrobials:  Anti-infectives (From admission, onward)    Start     Dose/Rate Route Frequency Ordered Stop   02/08/23 1100  Oritavancin Diphosphate (ORBACTIV) 1,200 mg in dextrose 5 % IVPB        1,200 mg 333.3 mL/hr over 180 Minutes Intravenous Once 02/08/23 0935 02/08/23 1425   02/08/23 1030  metroNIDAZOLE (FLAGYL) tablet 500 mg        500 mg Oral Every 12 hours 02/08/23 0935 02/14/23 2115   02/07/23 1745  amoxicillin-clavulanate (AUGMENTIN) 875-125 MG per tablet 1 tablet  Status:  Discontinued        1 tablet Oral Every 12 hours 02/07/23 1659 02/08/23 0935   02/06/23 2200  cefadroxil (DURICEF) capsule 500 mg  Status:  Discontinued        500 mg Oral 2 times daily 02/06/23 0939 02/06/23 0950   02/06/23 2200  cefadroxil  (DURICEF) capsule 1,000 mg  Status:  Discontinued        1,000 mg Oral 2 times daily 02/06/23 0950 02/07/23 1652   02/02/23 0900  cefTRIAXone (ROCEPHIN) 2 g in sodium chloride 0.9 % 100 mL IVPB  Status:  Discontinued        2 g 200 mL/hr over 30 Minutes Intravenous Every 24 hours 02/01/23 1044 02/06/23 0938   02/01/23 1200  cefTRIAXone (ROCEPHIN) 1 g in sodium chloride 0.9 % 100 mL IVPB  Status:  Discontinued        1 g 200 mL/hr over 30 Minutes Intravenous  Once 02/01/23 1101 02/01/23 1150   01/31/23 2200  vancomycin (VANCOCIN) IVPB 1000 mg/200 mL premix  Status:  Discontinued        1,000 mg 200 mL/hr over 60 Minutes Intravenous Every 12 hours 01/31/23 0840 02/06/23 0938   01/31/23 0900  cefTRIAXone (ROCEPHIN) 1 g in sodium chloride 0.9 % 100 mL IVPB  Status:  Discontinued        1 g 200 mL/hr over 30 Minutes Intravenous Every 24 hours 01/31/23 0811 02/01/23 1044   01/31/23 0900  vancomycin (VANCOREADY) IVPB  1500 mg/300 mL        1,500 mg 150 mL/hr over 120 Minutes Intravenous  Once 01/31/23 0840 01/31/23 1336       Subjective: Patient seen and examined at bedside today.  Very comfortable, hemodynamically stable.  Eating his breakfast.  He just little concerned about the swelling on the left forearm.  Discussed that this could be most likely from the IV line nearby but will check venous Doppler  Objective: Vitals:   02/18/23 1345 02/18/23 1942 02/19/23 0510 02/19/23 0834  BP: 122/71 106/60 (!) 106/45 (!) 103/51  Pulse: 75 (!) 59 63 74  Resp: '18 17 17 18  '$ Temp: 98.7 F (37.1 C) 98.1 F (36.7 C) 98.2 F (36.8 C) 99.1 F (37.3 C)  TempSrc:    Oral  SpO2: 98% 99% 97% 97%  Weight:      Height:        Intake/Output Summary (Last 24 hours) at 02/19/2023 1230 Last data filed at 02/19/2023 1100 Gross per 24 hour  Intake 6 ml  Output --  Net 6 ml   Filed Weights   01/23/23 2007  Weight: 77.7 kg    Examination:  General exam: Overall comfortable, not in  distress,icteric HEENT: PERRL Respiratory system:  no wheezes or crackles  Cardiovascular system: S1 & S2 heard, RRR.  Gastrointestinal system: Abdomen is mildly distended, soft and nontender. Central nervous system: Alert and oriented Extremities: Trace edema of lower extremities, scattered ecchymosis, dressing on the left hand, edema of the left forearm Skin: icterus   Data Reviewed: I have personally reviewed following labs and imaging studies  CBC: Recent Labs  Lab 02/14/23 0416  WBC 11.8*  HGB 8.4*  HCT 24.3*  MCV 106.6*  PLT 123XX123*   Basic Metabolic Panel: Recent Labs  Lab 02/14/23 0416 02/15/23 0400 02/16/23 0344 02/17/23 0353  NA 127* 134* 132* 136  K 3.9 4.2 4.4 4.2  CL 97* 100 100 100  CO2 '24 28 25 26  '$ GLUCOSE 91 131* 118* 111*  BUN 19 24* 21* 21*  CREATININE 1.12 1.33* 0.80 1.09  CALCIUM 7.8* 8.2* 8.2* 8.4*     No results found for this or any previous visit (from the past 240 hour(s)).    Radiology Studies: No results found.  Scheduled Meds:  folic acid  1 mg Oral Daily   furosemide  40 mg Oral Daily   hydrocerin   Topical BID   PARoxetine  10 mg Oral Daily   polyethylene glycol  17 g Oral Daily   QUEtiapine  25 mg Oral QHS   sodium chloride flush  3 mL Intravenous Q12H   spironolactone  50 mg Oral Daily   thiamine  100 mg Oral Daily   Continuous Infusions:  sodium chloride       LOS: 27 days   Shelly Coss, MD [facility] P3/14/2024, 12:30 PM

## 2023-02-19 NOTE — Progress Notes (Signed)
Upper extremity venous left study completed.  Preliminary results relayed to Jinny Blossom, RN on the floor. RN to message MD.   See CV Proc for preliminary results report.   Darlin Coco, RDMS, RVT

## 2023-02-19 NOTE — Plan of Care (Signed)

## 2023-02-19 NOTE — Plan of Care (Signed)
  Problem: Pain Managment: Goal: General experience of comfort will improve Outcome: Progressing   

## 2023-02-20 ENCOUNTER — Inpatient Hospital Stay (HOSPITAL_COMMUNITY): Payer: Medicaid Other

## 2023-02-20 DIAGNOSIS — M7989 Other specified soft tissue disorders: Secondary | ICD-10-CM

## 2023-02-20 LAB — COMPREHENSIVE METABOLIC PANEL
ALT: 20 U/L (ref 0–44)
AST: 62 U/L — ABNORMAL HIGH (ref 15–41)
Albumin: 2.1 g/dL — ABNORMAL LOW (ref 3.5–5.0)
Alkaline Phosphatase: 138 U/L — ABNORMAL HIGH (ref 38–126)
Anion gap: 6 (ref 5–15)
BUN: 18 mg/dL (ref 6–20)
CO2: 27 mmol/L (ref 22–32)
Calcium: 8.3 mg/dL — ABNORMAL LOW (ref 8.9–10.3)
Chloride: 102 mmol/L (ref 98–111)
Creatinine, Ser: 1.11 mg/dL (ref 0.61–1.24)
GFR, Estimated: >60 mL/min (ref >60)
Glucose, Bld: 99 mg/dL (ref 70–99)
Potassium: 4 mmol/L (ref 3.5–5.1)
Sodium: 135 mmol/L (ref 135–145)
Total Bilirubin: 6.3 mg/dL — ABNORMAL HIGH (ref 0.3–1.2)
Total Protein: 5.8 g/dL — ABNORMAL LOW (ref 6.5–8.1)

## 2023-02-20 LAB — CBC
HCT: 25.5 % — ABNORMAL LOW (ref 39.0–52.0)
Hemoglobin: 8.7 g/dL — ABNORMAL LOW (ref 13.0–17.0)
MCH: 37 pg — ABNORMAL HIGH (ref 26.0–34.0)
MCHC: 34.1 g/dL (ref 30.0–36.0)
MCV: 108.5 fL — ABNORMAL HIGH (ref 80.0–100.0)
Platelets: 102 10*3/uL — ABNORMAL LOW (ref 150–400)
RBC: 2.35 MIL/uL — ABNORMAL LOW (ref 4.22–5.81)
RDW: 19.6 % — ABNORMAL HIGH (ref 11.5–15.5)
WBC: 9.3 10*3/uL (ref 4.0–10.5)
nRBC: 0 % (ref 0.0–0.2)

## 2023-02-20 LAB — PROTIME-INR
INR: 2.7 — ABNORMAL HIGH (ref 0.8–1.2)
Prothrombin Time: 28.3 seconds — ABNORMAL HIGH (ref 11.4–15.2)

## 2023-02-20 NOTE — Progress Notes (Signed)
PROGRESS NOTE  Nathaniel Warren [id number] DOB: 1966-08-13 DOA: 01/22/2023 PCP: [name], NP  Brief History   Patient is a 57 year old male with history of alcohol abuse/cirrhosis of liver, depression, chronic right ankle pain, suicidal attempts presented to the emergency department with complaint of left groin swelling, right arm swelling.  Found to have hematoma of the left groin as well as swelling of the right hand consistent with hematoma.  CT hand showed diffuse swelling without any abscess.    Hand surgery  consulted,recommended conservative management.   Hospital course remarkable for persistent pain and swelling of the right hand,finding of necrotic left 4th finger tip s/p  left ring finger I and D and left dorsal hand abscess I&D. The patient was found to be positive for DVT of the right upper extremity on 02/19/2023. He is receiving apixaban for anticoagulation. DVT for the right lower extremity has been ordered due to severe swelling in that limb.   PT/OT recommended SNF .  Prolonged hospital course. TOC following for placement,medically stable for dc.  Consultants  Psychiatry Orthopedic surgery  Procedures  Left ring finger debridement  Antibiotics   Anti-infectives (From admission, onward)    Start     Dose/Rate Route Frequency Ordered Stop   02/08/23 1100  Oritavancin Diphosphate (ORBACTIV) 1,200 mg in dextrose 5 % IVPB        1,200 mg 333.3 mL/hr over 180 Minutes Intravenous Once 02/08/23 0935 02/08/23 1425   02/08/23 1030  metroNIDAZOLE (FLAGYL) tablet 500 mg        500 mg Oral Every 12 hours 02/08/23 0935 02/14/23 2115   02/07/23 1745  amoxicillin-clavulanate (AUGMENTIN) 875-125 MG per tablet 1 tablet  Status:  Discontinued        1 tablet Oral Every 12 hours 02/07/23 1659 02/08/23 0935   02/06/23 2200  cefadroxil (DURICEF) capsule 500 mg  Status:  Discontinued        500 mg Oral 2 times daily 02/06/23 0939 02/06/23 0950   02/06/23 2200  cefadroxil (DURICEF)  capsule 1,000 mg  Status:  Discontinued        1,000 mg Oral 2 times daily 02/06/23 0950 02/07/23 1652   02/02/23 0900  cefTRIAXone (ROCEPHIN) 2 g in sodium chloride 0.9 % 100 mL IVPB  Status:  Discontinued        2 g 200 mL/hr over 30 Minutes Intravenous Every 24 hours 02/01/23 1044 02/06/23 0938   02/01/23 1200  cefTRIAXone (ROCEPHIN) 1 g in sodium chloride 0.9 % 100 mL IVPB  Status:  Discontinued        1 g 200 mL/hr over 30 Minutes Intravenous  Once 02/01/23 1101 02/01/23 1150   01/31/23 2200  vancomycin (VANCOCIN) IVPB 1000 mg/200 mL premix  Status:  Discontinued        1,000 mg 200 mL/hr over 60 Minutes Intravenous Every 12 hours 01/31/23 0840 02/06/23 0938   01/31/23 0900  cefTRIAXone (ROCEPHIN) 1 g in sodium chloride 0.9 % 100 mL IVPB  Status:  Discontinued        1 g 200 mL/hr over 30 Minutes Intravenous Every 24 hours 01/31/23 0811 02/01/23 1044   01/31/23 0900  vancomycin (VANCOREADY) IVPB 1500 mg/300 mL        1,500 mg 150 mL/hr over 120 Minutes Intravenous  Once 01/31/23 0840 01/31/23 1336        Subjective  The patient is resting comfortably. He has many questions regarding the use of anticoagulation in the setting of liver  disease.  Objective   Vitals:  Vitals:   02/20/23 0415 02/20/23 1328  BP: (!) 113/54 122/61  Pulse: 66 75  Resp: 18 18  Temp: 98.2 F (36.8 C) 98.4 F (36.9 C)  SpO2: 96% 97%    Exam:  Constitutional:  The patient is awake, alert, and oriented x 3. No acute distress. Respiratory:  No increased work of breathing. No wheezes, rales, or rhonchi No tactile fremitus Cardiovascular:  Regular rate and rhythm No murmurs, ectopy, or gallups. No lateral PMI. No thrills. Abdomen:  Abdomen is soft, non-tender, non-distended No hernias, masses, or organomegaly Normoactive bowel sounds.  Musculoskeletal:  No cyanosis, clubbing, or edema Left hand bandaged. 3+ pitting edema of the right lower extremity. Skin:  No rashes, lesions,  ulcers palpation of skin: no induration or nodules Neurologic:  CN 2-12 intact Sensation all 4 extremities intact Psychiatric:  Mental status Mood, affect appropriate Orientation to person, place, time  judgment and insight appear intact   I have personally reviewed the following:   Today's Data   Vitals:   02/20/23 2031 02/21/23 0520  BP: (!) 106/58 (!) 108/52  Pulse: 63 (!) 57  Resp: 18 16  Temp: 98.5 F (36.9 C) 98.1 F (36.7 C)  SpO2: 97% 98%    Scheduled Meds:  apixaban  10 mg Oral BID   Followed by   Derrill Memo ON 02/26/2023] apixaban  5 mg Oral BID   folic acid  1 mg Oral Daily   furosemide  40 mg Oral Daily   hydrocerin   Topical BID   PARoxetine  10 mg Oral Daily   polyethylene glycol  17 g Oral Daily   QUEtiapine  25 mg Oral QHS   sodium chloride flush  3 mL Intravenous Q12H   spironolactone  50 mg Oral Daily   thiamine  100 mg Oral Daily   Continuous Infusions:  sodium chloride      Principal Problem:   Spontaneous hematoma of lower leg Active Problems:   Alcoholic cirrhosis of liver with ascites (HCC)   MDD (major depressive disorder), recurrent severe, without psychosis ([place])   Hyponatremia   Tobacco abuse   Suicidal ideations   LOS: 28 days   A & P  Alcoholic cirrhosis of liver with ascites ([place]) Pt states he is no longer drinking  MELD MELD 3.0: 25 at 01/22/2023  7:28 PM At risk for complications.  Suspect spontaneous hematomas could be related to history of alcohol abuse and elevated INR.  Continue to monitor also noted elevated bilirubin And hyponatremia. Resume Lasix and spironolactone   Spontaneous hematoma of lower leg Also spontaneous hematoma of the right upper extremity.  Continue to monitor serial CBC.  Monitor and progressive vascular and neurochecks every 4 hours  Hyponatremia Chronic in the setting of history of cirrhosis.  Resume Lasix spironolactone.  Obtain electrolytes  Tobacco abuse  - Spoke about importance of quitting  spent 5 minutes discussing options for treatment, prior attempts at quitting, and dangers of smoking  -At this point patient is     interested in quitting  -  refused nicotine patch   - nursing tobacco cessation protocol   Suicidal ideations Consult behavioral health Suicidal precautions  Pt states he wants to go home and plans to "end it all" IVC paper work completed  Acute deep vein thrombosis (DVT) of brachial vein of left upper extremity (HCC) Apixaban for treatment.   Hyponatremia Resolved. Na 135.  MDD (major depressive disorder), recurrent severe, without psychosis (  [place]) Continue Paxil.  Suicidal ideations  Psychiatry was following.  On paroxetine, quetiapine.  No longer requires IVC. No need for inpatient psychiatric admission as per psychiatry      Tobacco abuse Noted. Nicotine supplement offered.   I have seen and examined this patient myself. No new complaints.   DVT prophylaxis: Apixaban Code Status: Full code Family Communication: None available Disposition Plan: SNF   Tanji Storrs, DO [facility] Direct contact: see [email or web address]  7PM-7AM contact night coverage as above 02/20/2023, 6:08 PM  LOS: 28 days

## 2023-02-20 NOTE — Progress Notes (Signed)
Right lower extremity venous duplex has been completed. Preliminary results can be found in CV Proc through chart review.   02/20/23 3:52 PM Nathaniel Warren RVT

## 2023-02-20 NOTE — Progress Notes (Signed)
Physical Therapy Treatment Patient Details Name: Nathaniel Warren MRN: [id number] DOB: [birth year] Today's Date: 02/20/2023   History of Present Illness 57 yo male with onset of groin and RT dorsal hand bruising and edema with reported light trauma during a fall.  I&D to LT D4 on 02/03/23. Has h/o R foot drop without AFO. + for suicidal ideations.   PMHx:  etoh abuse, depression with h/o 4 suicide attempts, stroke, seizures, liver failure, R ankle fracture with ORIF, atherosclerosis, cirrhosis, diverticulosis, osteopenia.    PT Comments    AxO x 3 pleasant.  Stated, "they found a blood clot in my (left) arm".  Assisted to EOB.  Noted increased edema RIGHT LE.  Hard to apply RIGHT shoe.  Pitting edema mark from top of socks.  Assisted with amb in hallway.  General transfer comment: self able to rise with increased time from elevated bed.  Using R UE to "push up" and L LE > R LE strength.  R LE present with increased edema.  Decreased distance in hallway and with heavy lean on LEFT platform walker due to c/o R LE weakness, edema and foot pain, esp heel.  Assisted back to bed.  Elevated RIGHT LE.   Pt will need ST Rehab at SNF to address mobility and functional decline prior to safely returning home.    Recommendations for follow up therapy are one component of a multi-disciplinary discharge planning process, led by the attending physician.  Recommendations may be updated based on patient status, additional functional criteria and insurance authorization.  Follow Up Recommendations  Skilled nursing-short term rehab (<3 hours/day) Can patient physically be transported by private vehicle: No   Assistance Recommended at Discharge Intermittent Supervision/Assistance  Patient can return home with the following A little help with walking and/or transfers;Direct supervision/assist for medications management;Assistance with cooking/housework;Assist for transportation;A little help with  bathing/dressing/bathroom;Help with stairs or ramp for entrance   Equipment Recommendations  Rolling walker (2 wheels);Other (comment) (LEFT platform)    Recommendations for Other Services       Precautions / Restrictions Precautions Precautions: Fall Precaution Comments: R foot drop, does better with his shoes on (less ankle roll) Required Braces or Orthoses: Splint/Cast Splint/Cast: LT ring finger Restrictions Weight Bearing Restrictions: No Other Position/Activity Restrictions: Limited LT hand WB since I&D to D4     Mobility  Bed Mobility Overal bed mobility: Modified Independent             General bed mobility comments: self able with increased time    Transfers Overall transfer level: Needs assistance Equipment used: Left platform walker Transfers: Sit to/from Stand Sit to Stand: Min guard, From elevated surface           General transfer comment: self able to rise with increased time from elevated bed.  Using R UE to "push up" and L LE > R LE strength.  R LE present with increased edema.    Ambulation/Gait Ambulation/Gait assistance: Supervision, Min guard Gait Distance (Feet): 26 Feet Assistive device: Left platform walker, Rolling walker (2 wheels) Gait Pattern/deviations: Step-through pattern, Steppage, Narrow base of support Gait velocity: decreased     General Gait Details: applied B shoes.  Decreased distance in hallway and with heavy lean on LEFT platform walker due to c/o R LE weakness, edema and foot pain, esp heel.  Very difficult to apply R shoe due to foot edema.   Stairs             Emergency planning/management officer  Modified Rankin (Stroke Patients Only)       Balance                                            Cognition Arousal/Alertness: Awake/alert Behavior During Therapy: WFL for tasks assessed/performed Overall Cognitive Status: Within Functional Limits for tasks assessed                                  General Comments: Very pleasant, motivated        Exercises      General Comments        Pertinent Vitals/Pain Pain Assessment Pain Assessment: Faces Pain Location: R heel Pain Descriptors / Indicators: Grimacing, Sore, Tender Pain Intervention(s): Monitored during session, Repositioned    Home Living                          Prior Function            PT Goals (current goals can now be found in the care plan section) Progress towards PT goals: Progressing toward goals    Frequency    Min 3X/week      PT Plan Discharge plan needs to be updated    Co-evaluation              AM-PAC PT "6 Clicks" Mobility   Outcome Measure  Help needed turning from your back to your side while in a flat bed without using bedrails?: None Help needed moving from lying on your back to sitting on the side of a flat bed without using bedrails?: A Little Help needed moving to and from a bed to a chair (including a wheelchair)?: A Little Help needed standing up from a chair using your arms (e.g., wheelchair or bedside chair)?: A Little Help needed to walk in hospital room?: A Lot Help needed climbing 3-5 steps with a railing? : Total 6 Click Score: 16    End of Session Equipment Utilized During Treatment: Gait belt Activity Tolerance: Patient tolerated treatment well Patient left: in bed;with call bell/phone within reach;with bed alarm set Nurse Communication: Mobility status PT Visit Diagnosis: Muscle weakness (generalized) (M62.81);Other abnormalities of gait and mobility (R26.89);History of falling (Z91.81)     Time: [id number] PT Time Calculation (min) (ACUTE ONLY): 28 min  Charges:  $Gait Training: 8-22 mins $Therapeutic Activity: 8-22 mins                     {Canaan Holzer  PTA Acute  Rehabilitation Services Office M-F          [phone] Weekend pager [phone]

## 2023-02-21 DIAGNOSIS — I82622 Acute embolism and thrombosis of deep veins of left upper extremity: Secondary | ICD-10-CM | POA: Diagnosis present

## 2023-02-21 DIAGNOSIS — L8990 Pressure ulcer of unspecified site, unspecified stage: Secondary | ICD-10-CM | POA: Diagnosis present

## 2023-02-21 LAB — CBC WITH DIFFERENTIAL/PLATELET
Abs Immature Granulocytes: 0.03 10*3/uL (ref 0.00–0.07)
Basophils Absolute: 0.1 10*3/uL (ref 0.0–0.1)
Basophils Relative: 1 %
Eosinophils Absolute: 0.7 10*3/uL — ABNORMAL HIGH (ref 0.0–0.5)
Eosinophils Relative: 8 %
HCT: 25.4 % — ABNORMAL LOW (ref 39.0–52.0)
Hemoglobin: 8.8 g/dL — ABNORMAL LOW (ref 13.0–17.0)
Immature Granulocytes: 0 %
Lymphocytes Relative: 31 %
Lymphs Abs: 2.9 10*3/uL (ref 0.7–4.0)
MCH: 37.4 pg — ABNORMAL HIGH (ref 26.0–34.0)
MCHC: 34.6 g/dL (ref 30.0–36.0)
MCV: 108.1 fL — ABNORMAL HIGH (ref 80.0–100.0)
Monocytes Absolute: 1.1 10*3/uL — ABNORMAL HIGH (ref 0.1–1.0)
Monocytes Relative: 12 %
Neutro Abs: 4.3 10*3/uL (ref 1.7–7.7)
Neutrophils Relative %: 48 %
Platelets: 106 10*3/uL — ABNORMAL LOW (ref 150–400)
RBC: 2.35 MIL/uL — ABNORMAL LOW (ref 4.22–5.81)
RDW: 19.3 % — ABNORMAL HIGH (ref 11.5–15.5)
WBC: 9.1 10*3/uL (ref 4.0–10.5)
nRBC: 0 % (ref 0.0–0.2)

## 2023-02-21 LAB — BASIC METABOLIC PANEL
Anion gap: 6 (ref 5–15)
BUN: 17 mg/dL (ref 6–20)
CO2: 27 mmol/L (ref 22–32)
Calcium: 8.3 mg/dL — ABNORMAL LOW (ref 8.9–10.3)
Chloride: 101 mmol/L (ref 98–111)
Creatinine, Ser: 1.03 mg/dL (ref 0.61–1.24)
GFR, Estimated: >60 mL/min (ref >60)
Glucose, Bld: 101 mg/dL — ABNORMAL HIGH (ref 70–99)
Potassium: 4 mmol/L (ref 3.5–5.1)
Sodium: 134 mmol/L — ABNORMAL LOW (ref 135–145)

## 2023-02-21 MED ORDER — ENOXAPARIN SODIUM 80 MG/0.8ML IJ SOSY
80.0000 mg | PREFILLED_SYRINGE | Freq: Two times a day (BID) | INTRAMUSCULAR | Status: DC
  Administered 2023-02-21 – 2023-03-02 (×18): 80 mg via SUBCUTANEOUS
  Filled 2023-02-21 (×19): qty 0.8

## 2023-02-21 MED ORDER — ENOXAPARIN SODIUM 80 MG/0.8ML IJ SOSY: 1.0000 mg/kg | PREFILLED_SYRINGE | Freq: Two times a day (BID) | INTRAMUSCULAR | Status: DC

## 2023-02-21 NOTE — Assessment & Plan Note (Addendum)
Resolved. Na 135 today.

## 2023-02-21 NOTE — Plan of Care (Signed)

## 2023-02-21 NOTE — Plan of Care (Signed)
  Problem: Health Behavior/Discharge Planning: Goal: Ability to manage health-related needs will improve Outcome: Progressing   Problem: Pain Managment: Goal: General experience of comfort will improve Outcome: Progressing   

## 2023-02-21 NOTE — Assessment & Plan Note (Signed)
Noted. Nicotine supplement offered.

## 2023-02-21 NOTE — Assessment & Plan Note (Addendum)
I have discussed the patient with pharmacy. As the patient is a Childs-Pugh C, pharmacy recommends changing the patient to lovenox 1 mg/kg instead of apixaban. The patient states that he thinks that he will be able to administer the injections. I have asked nursing to instruct the patient on administration.

## 2023-02-21 NOTE — Assessment & Plan Note (Signed)
Psychiatry was following.  On paroxetine, quetiapine.  No longer requires IVC. No need for inpatient psychiatric admission as per psychiatry

## 2023-02-21 NOTE — Assessment & Plan Note (Signed)
Continue Paxil.

## 2023-02-21 NOTE — Progress Notes (Signed)
PROGRESS NOTE  Nathaniel Warren [id number] DOB: 03/29/1966 DOA: 01/22/2023 PCP: [name], NP  Brief History   Patient is a 57 year old male with history of alcohol abuse/cirrhosis of liver, depression, chronic right ankle pain, suicidal attempts presented to the emergency department with complaint of left groin swelling, right arm swelling.  Found to have hematoma of the left groin as well as swelling of the right hand consistent with hematoma.  CT hand showed diffuse swelling without any abscess.    Hand surgery  consulted,recommended conservative management.   Hospital course remarkable for persistent pain and swelling of the right hand,finding of necrotic left 4th finger tip s/p  left ring finger I and D and left dorsal hand abscess I&D. The patient was found to be positive for DVT of the right upper extremity on 02/19/2023. He is receiving apixaban for anticoagulation. DVT for the right lower extremity has been ordered due to severe swelling in that limb.  The patient is having swelling of the elbow of the left upper extremity. This is due to dependent edema. I have recommended to the patient's nurse that the elbow be raised up.   PT/OT recommended SNF .  Prolonged hospital course. TOC following for placement,medically stable for dc.  Consultants  Psychiatry Orthopedic surgery  Procedures  Left ring finger debridement  Antibiotics   Anti-infectives (From admission, onward)    Start     Dose/Rate Route Frequency Ordered Stop   02/08/23 1100  Oritavancin Diphosphate (ORBACTIV) 1,200 mg in dextrose 5 % IVPB        1,200 mg 333.3 mL/hr over 180 Minutes Intravenous Once 02/08/23 0935 02/08/23 1425   02/08/23 1030  metroNIDAZOLE (FLAGYL) tablet 500 mg        500 mg Oral Every 12 hours 02/08/23 0935 02/14/23 2115   02/07/23 1745  amoxicillin-clavulanate (AUGMENTIN) 875-125 MG per tablet 1 tablet  Status:  Discontinued        1 tablet Oral Every 12 hours 02/07/23 1659 02/08/23 0935    02/06/23 2200  cefadroxil (DURICEF) capsule 500 mg  Status:  Discontinued        500 mg Oral 2 times daily 02/06/23 0939 02/06/23 0950   02/06/23 2200  cefadroxil (DURICEF) capsule 1,000 mg  Status:  Discontinued        1,000 mg Oral 2 times daily 02/06/23 0950 02/07/23 1652   02/02/23 0900  cefTRIAXone (ROCEPHIN) 2 g in sodium chloride 0.9 % 100 mL IVPB  Status:  Discontinued        2 g 200 mL/hr over 30 Minutes Intravenous Every 24 hours 02/01/23 1044 02/06/23 0938   02/01/23 1200  cefTRIAXone (ROCEPHIN) 1 g in sodium chloride 0.9 % 100 mL IVPB  Status:  Discontinued        1 g 200 mL/hr over 30 Minutes Intravenous  Once 02/01/23 1101 02/01/23 1150   01/31/23 2200  vancomycin (VANCOCIN) IVPB 1000 mg/200 mL premix  Status:  Discontinued        1,000 mg 200 mL/hr over 60 Minutes Intravenous Every 12 hours 01/31/23 0840 02/06/23 0938   01/31/23 0900  cefTRIAXone (ROCEPHIN) 1 g in sodium chloride 0.9 % 100 mL IVPB  Status:  Discontinued        1 g 200 mL/hr over 30 Minutes Intravenous Every 24 hours 01/31/23 0811 02/01/23 1044   01/31/23 0900  vancomycin (VANCOREADY) IVPB 1500 mg/300 mL        1,500 mg 150 mL/hr over 120 Minutes Intravenous  Once 01/31/23 0840 01/31/23 1336        Subjective  The patient is resting comfortably. He has many questions regarding the use of anticoagulation in the setting of liver disease.  Objective   Vitals:  Vitals:   02/21/23 0520 02/21/23 1431  BP: (!) 108/52 (!) 104/49  Pulse: (!) 57 61  Resp: 16 17  Temp: 98.1 F (36.7 C) 98.8 F (37.1 C)  SpO2: 98% 99%    Exam:  Constitutional:  The patient is awake, alert, and oriented x 3. No acute distress. Respiratory:  No increased work of breathing. No wheezes, rales, or rhonchi No tactile fremitus Cardiovascular:  Regular rate and rhythm No murmurs, ectopy, or gallups. No lateral PMI. No thrills. Abdomen:  Abdomen is soft, non-tender, non-distended No hernias, masses, or  organomegaly Normoactive bowel sounds.  Musculoskeletal:  No cyanosis, clubbing, or edema Left hand bandaged. 3+ pitting edema of the right lower extremity. Dependent edema of the elbow of the left upper extremity. Skin:  No rashes, lesions, ulcers palpation of skin: no induration or nodules Neurologic:  CN 2-12 intact Sensation all 4 extremities intact Psychiatric:  Mental status Mood, affect appropriate Orientation to person, place, time  judgment and insight appear intact   I have personally reviewed the following:   Today's Data   Vitals:   02/21/23 0520 02/21/23 1431  BP: (!) 108/52 (!) 104/49  Pulse: (!) 57 61  Resp: 16 17  Temp: 98.1 F (36.7 C) 98.8 F (37.1 C)  SpO2: 98% 99%    Scheduled Meds:  enoxaparin (LOVENOX) injection  80 mg Subcutaneous [id number]   folic acid  1 mg Oral Daily   furosemide  40 mg Oral Daily   hydrocerin   Topical BID   PARoxetine  10 mg Oral Daily   polyethylene glycol  17 g Oral Daily   QUEtiapine  25 mg Oral QHS   sodium chloride flush  3 mL Intravenous Q12H   spironolactone  50 mg Oral Daily   thiamine  100 mg Oral Daily   Continuous Infusions:  sodium chloride      Principal Problem:   Spontaneous hematoma of lower leg Active Problems:   Alcoholic cirrhosis of liver with ascites (HCC)   MDD (major depressive disorder), recurrent severe, without psychosis (HCC)   Hyponatremia   Tobacco abuse   Suicidal ideations   Acute deep vein thrombosis (DVT) of brachial vein of left upper extremity (HCC)   Pressure injury of skin   LOS: 29 days   A & P  Alcoholic cirrhosis of liver with ascites (HCC) Pt states he is no longer drinking  MELD MELD 3.0: 25 at 01/22/2023  7:28 PM At risk for complications.  Suspect spontaneous hematomas could be related to history of alcohol abuse and elevated INR.  Continue to monitor also noted elevated bilirubin And hyponatremia. Resume Lasix and spironolactone   Spontaneous hematoma of lower  leg Also spontaneous hematoma of the right upper extremity.  Continue to monitor serial CBC.  Monitor and progressive vascular and neurochecks every 4 hours  Hyponatremia Chronic in the setting of history of cirrhosis.  Resume Lasix spironolactone.  Obtain electrolytes  Tobacco abuse  - Spoke about importance of quitting spent 5 minutes discussing options for treatment, prior attempts at quitting, and dangers of smoking  -At this point patient is     interested in quitting  -  refused nicotine patch   - nursing tobacco cessation protocol   Suicidal ideations Consult behavioral  health Suicidal precautions  Pt states he wants to go home and plans to "end it all" IVC paper work completed  Acute deep vein thrombosis (DVT) of brachial vein of left upper extremity ([place]) I have discussed the patient with pharmacy. As the patient is a Childs-Pugh C, pharmacy recommends changing the patient to lovenox 1 mg/kg instead of apixaban. The patient states that he thinks that he will be able to administer the injections. I have asked nursing to instruct the patient on administration.  Hyponatremia Resolved. Na 134 today.  MDD (major depressive disorder), recurrent severe, without psychosis ([place]) Continue Paxil.  Suicidal ideations  Psychiatry was following.  On paroxetine, quetiapine.  No longer requires IVC. No need for inpatient psychiatric admission as per psychiatry      Tobacco abuse Noted. Nicotine supplement offered.   I have seen and examined this patient myself. No new complaints.   DVT prophylaxis: Apixaban Code Status: Full code Family Communication: None available Disposition Plan: SNF   Leanny Moeckel, DO [facility] Direct contact: see [email or web address]  7PM-7AM contact night coverage as above 02/21/2023, 4:00 PM  LOS: 28 days

## 2023-02-22 LAB — BASIC METABOLIC PANEL
Anion gap: 6 (ref 5–15)
BUN: 17 mg/dL (ref 6–20)
CO2: 26 mmol/L (ref 22–32)
Calcium: 8.2 mg/dL — ABNORMAL LOW (ref 8.9–10.3)
Chloride: 103 mmol/L (ref 98–111)
Creatinine, Ser: 1 mg/dL (ref 0.61–1.24)
GFR, Estimated: >60 mL/min (ref >60)
Glucose, Bld: 95 mg/dL (ref 70–99)
Potassium: 4.1 mmol/L (ref 3.5–5.1)
Sodium: 135 mmol/L (ref 135–145)

## 2023-02-22 LAB — CBC WITH DIFFERENTIAL/PLATELET
Abs Immature Granulocytes: 0.09 10*3/uL — ABNORMAL HIGH (ref 0.00–0.07)
Basophils Absolute: 0.1 10*3/uL (ref 0.0–0.1)
Basophils Relative: 1 %
Eosinophils Absolute: 0.8 10*3/uL — ABNORMAL HIGH (ref 0.0–0.5)
Eosinophils Relative: 8 %
HCT: 28.2 % — ABNORMAL LOW (ref 39.0–52.0)
Hemoglobin: 9 g/dL — ABNORMAL LOW (ref 13.0–17.0)
Immature Granulocytes: 1 %
Lymphocytes Relative: 36 %
Lymphs Abs: 3.3 10*3/uL (ref 0.7–4.0)
MCH: 37.8 pg — ABNORMAL HIGH (ref 26.0–34.0)
MCHC: 31.9 g/dL (ref 30.0–36.0)
MCV: 118.5 fL — ABNORMAL HIGH (ref 80.0–100.0)
Monocytes Absolute: 1.2 10*3/uL — ABNORMAL HIGH (ref 0.1–1.0)
Monocytes Relative: 12 %
Neutro Abs: 3.9 10*3/uL (ref 1.7–7.7)
Neutrophils Relative %: 42 %
Platelets: 101 10*3/uL — ABNORMAL LOW (ref 150–400)
RBC: 2.38 MIL/uL — ABNORMAL LOW (ref 4.22–5.81)
RDW: 19.6 % — ABNORMAL HIGH (ref 11.5–15.5)
WBC: 9.3 10*3/uL (ref 4.0–10.5)
nRBC: 0 % (ref 0.0–0.2)

## 2023-02-22 NOTE — TOC Progression Note (Signed)
Transition of Care [facility]) - Progression Note    Patient Details  Name: Nathaniel Warren MRN: [id number] Date of Birth: October 04, 1966  [facility]) CM/SW Contact  Lennart Pall, LCSW Phone Number: 02/22/2023, 10:35 AM  Clinical Narrative:     Have updated FL2 to reflect PASRR# received.  Still no SNF bed offers.  Expected Discharge Plan: Psychiatric Hospital Barriers to Discharge: Continued Medical Work up  Expected Discharge Plan and Services In-house Referral: Clinical Social Work Discharge Planning Services: NA Post Acute Care Choice: NA Living arrangements for the past 2 months: Single Family Home                 DME Arranged: N/A DME Agency: NA                   Social Determinants of Health (SDOH) Interventions [place]: Food Insecurity Present (01/23/2023)  Housing: Medium Risk (01/23/2023)  Transportation Needs: Unmet Transportation Needs (01/23/2023)  Utilities: Not At Risk (01/23/2023)  Alcohol Screen: Medium Risk (10/27/2017)  Depression (PHQ2-9): High Risk (12/18/2022)  Financial Resource Strain: High Risk (07/15/2022)  Physical Activity: Inactive (07/15/2022)  Social Connections: Socially Isolated (07/15/2022)  Stress: Stress Concern Present (07/15/2022)  Tobacco Use: High Risk (02/04/2023)    Readmission Risk Interventions     No data to display

## 2023-02-22 NOTE — Assessment & Plan Note (Signed)
Stage 2 left buttock. Wound care consulted.

## 2023-02-22 NOTE — Assessment & Plan Note (Signed)
Pharmacy has recommended lovenox for treatment of upper extremity DVT as opposed to DOAC due to patient's Pugh-Childs Class C status. The patient is being educated on administration of lovenox. TOC will be consulted to ensure that the patient's insurance will cover this cost.

## 2023-02-22 NOTE — Progress Notes (Signed)
PROGRESS NOTE  Nathaniel Warren [id number] DOB: 06-Dec-1966 DOA: 01/22/2023 PCP: [name], NP  Brief History   Patient is a 57 year old male with history of alcohol abuse/cirrhosis of liver, depression, chronic right ankle pain, suicidal attempts presented to the emergency department with complaint of left groin swelling, right arm swelling.  Found to have hematoma of the left groin as well as swelling of the right hand consistent with hematoma.  CT hand showed diffuse swelling without any abscess.    Hand surgery  consulted,recommended conservative management.   Hospital course remarkable for persistent pain and swelling of the right hand,finding of necrotic left 4th finger tip s/p  left ring finger I and D and left dorsal hand abscess I&D. The patient was found to be positive for DVT of the right upper extremity on 02/19/2023. He is receiving apixaban for anticoagulation. DVT for the right lower extremity has been ordered due to severe swelling in that limb.  The patient is having swelling of the elbow of the left upper extremity. This is due to dependent edema. I have recommended to the patient's nurse that the elbow be raised up.   PT/OT recommended SNF .  Prolonged hospital course. TOC following for placement,medically stable for dc.  Pt's eliquis has been converted to lovenox at the suggestion of pharmacy as the patient is a Pugh-Childs Class C. The patient is being educated on administration of the lovenox. TOC will be consulted to ensure that the medication will be available to the patient once he leaves the hospital.  This afternoon the patient has expressed concern about drainage from his hand wound. It has been suggested that ortho be contacted in the morning to re-evaluate the wound.  Consultants  Psychiatry Orthopedic surgery  Procedures  Left ring finger debridement  Antibiotics   Anti-infectives (From admission, onward)    Start     Dose/Rate Route Frequency Ordered  Stop   02/08/23 1100  Oritavancin Diphosphate (ORBACTIV) 1,200 mg in dextrose 5 % IVPB        1,200 mg 333.3 mL/hr over 180 Minutes Intravenous Once 02/08/23 0935 02/08/23 1425   02/08/23 1030  metroNIDAZOLE (FLAGYL) tablet 500 mg        500 mg Oral Every 12 hours 02/08/23 0935 02/14/23 2115   02/07/23 1745  amoxicillin-clavulanate (AUGMENTIN) 875-125 MG per tablet 1 tablet  Status:  Discontinued        1 tablet Oral Every 12 hours 02/07/23 1659 02/08/23 0935   02/06/23 2200  cefadroxil (DURICEF) capsule 500 mg  Status:  Discontinued        500 mg Oral 2 times daily 02/06/23 0939 02/06/23 0950   02/06/23 2200  cefadroxil (DURICEF) capsule 1,000 mg  Status:  Discontinued        1,000 mg Oral 2 times daily 02/06/23 0950 02/07/23 1652   02/02/23 0900  cefTRIAXone (ROCEPHIN) 2 g in sodium chloride 0.9 % 100 mL IVPB  Status:  Discontinued        2 g 200 mL/hr over 30 Minutes Intravenous Every 24 hours 02/01/23 1044 02/06/23 0938   02/01/23 1200  cefTRIAXone (ROCEPHIN) 1 g in sodium chloride 0.9 % 100 mL IVPB  Status:  Discontinued        1 g 200 mL/hr over 30 Minutes Intravenous  Once 02/01/23 1101 02/01/23 1150   01/31/23 2200  vancomycin (VANCOCIN) IVPB 1000 mg/200 mL premix  Status:  Discontinued        1,000 mg 200 mL/hr over  60 Minutes Intravenous Every 12 hours 01/31/23 0840 02/06/23 0938   01/31/23 0900  cefTRIAXone (ROCEPHIN) 1 g in sodium chloride 0.9 % 100 mL IVPB  Status:  Discontinued        1 g 200 mL/hr over 30 Minutes Intravenous Every 24 hours 01/31/23 0811 02/01/23 1044   01/31/23 0900  vancomycin (VANCOREADY) IVPB 1500 mg/300 mL        1,500 mg 150 mL/hr over 120 Minutes Intravenous  Once 01/31/23 0840 01/31/23 1336        Subjective  The patient is resting comfortably. Objective   Vitals:  Vitals:   02/22/23 0623 02/22/23 1315  BP: (!) 92/45 (!) 105/50  Pulse: 66 70  Resp: 17 18  Temp: 98.8 F (37.1 C) 98.6 F (37 C)  SpO2: 95% 96%     Exam:  Constitutional:  The patient is awake, alert, and oriented x 3. No acute distress. Respiratory:  No increased work of breathing. No wheezes, rales, or rhonchi No tactile fremitus Cardiovascular:  Regular rate and rhythm No murmurs, ectopy, or gallups. No lateral PMI. No thrills. Abdomen:  Abdomen is soft, non-tender, non-distended No hernias, masses, or organomegaly Normoactive bowel sounds.  Musculoskeletal:  No cyanosis, clubbing, or edema Left hand bandaged. 3+ pitting edema of the right lower extremity. Dependent edema of the elbow of the left upper extremity. Skin:  No rashes, lesions, ulcers palpation of skin: no induration or nodules Neurologic:  CN 2-12 intact Sensation all 4 extremities intact Psychiatric:  Mental status Mood, affect appropriate Orientation to person, place, time  judgment and insight appear intact   I have personally reviewed the following:   Today's Data   Vitals:   02/22/23 0623 02/22/23 1315  BP: (!) 92/45 (!) 105/50  Pulse: 66 70  Resp: 17 18  Temp: 98.8 F (37.1 C) 98.6 F (37 C)  SpO2: 95% 96%    Scheduled Meds:  enoxaparin (LOVENOX) injection  80 mg Subcutaneous [id number]   folic acid  1 mg Oral Daily   furosemide  40 mg Oral Daily   hydrocerin   Topical BID   PARoxetine  10 mg Oral Daily   polyethylene glycol  17 g Oral Daily   QUEtiapine  25 mg Oral QHS   sodium chloride flush  3 mL Intravenous Q12H   spironolactone  50 mg Oral Daily   thiamine  100 mg Oral Daily   Continuous Infusions:  sodium chloride      Principal Problem:   Spontaneous hematoma of lower leg Active Problems:   Alcoholic cirrhosis of liver with ascites (HCC)   MDD (major depressive disorder), recurrent severe, without psychosis (HCC)   Hyponatremia   Tobacco abuse   Suicidal ideations   Acute deep vein thrombosis (DVT) of brachial vein of left upper extremity (HCC)   Pressure injury of skin   LOS: 30 days   A & P  Alcoholic  cirrhosis of liver with ascites (HCC) Pt states he is no longer drinking  MELD MELD 3.0: 25 at 01/22/2023  7:28 PM At risk for complications.  Suspect spontaneous hematomas could be related to history of alcohol abuse and elevated INR.  Continue to monitor also noted elevated bilirubin And hyponatremia. Resume Lasix and spironolactone   Spontaneous hematoma of lower leg Also spontaneous hematoma of the right upper extremity.  Continue to monitor serial CBC.  Monitor and progressive vascular and neurochecks every 4 hours  Hyponatremia Chronic in the setting of history of cirrhosis.  Resume  Lasix spironolactone.  Obtain electrolytes  Tobacco abuse  - Spoke about importance of quitting spent 5 minutes discussing options for treatment, prior attempts at quitting, and dangers of smoking  -At this point patient is     interested in quitting  -  refused nicotine patch   - nursing tobacco cessation protocol   Suicidal ideations Consult behavioral health Suicidal precautions  Pt states he wants to go home and plans to "end it all" IVC paper work completed  Acute deep vein thrombosis (DVT) of brachial vein of left upper extremity ([place]) I have discussed the patient with pharmacy. As the patient is a Childs-Pugh C, pharmacy recommends changing the patient to lovenox 1 mg/kg instead of apixaban. The patient states that he thinks that he will be able to administer the injections. I have asked nursing to instruct the patient on administration. TOc will be consulted to ensure that the patient will be able to access enoxaparin as outpatient.  Hyponatremia Resolved. Na 135 today.  MDD (major depressive disorder), recurrent severe, without psychosis ([place]) Continue Paxil.  Suicidal ideations  Psychiatry was following.  On paroxetine, quetiapine.  No longer requires IVC. No need for inpatient psychiatric admission as per psychiatry      Tobacco abuse Noted. Nicotine supplement offered.  Alcoholic  cirrhosis of liver with ascites [facility]) Pharmacy has recommended lovenox for treatment of upper extremity DVT as opposed to [place] due to patient's Pugh-Childs Class C status. The patient is being educated on administration of lovenox. TOC will be consulted to ensure that the patient's insurance will cover this cost.  Pressure injury of skin Stage 2 left buttock. Wound care consulted.  Spontaneous hematoma of lower leg Monitor.   I have seen and examined this patient myself. No new complaints.   DVT prophylaxis: Apixaban Code Status: Full code Family Communication: None available Disposition Plan: SNF   Nathaniel Guillermo, DO [facility] Direct contact: see [email or web address]  7PM-7AM contact night coverage as above 02/22/2023, 6:24 PM  LOS: 28 days

## 2023-02-22 NOTE — Assessment & Plan Note (Signed)
Monitor

## 2023-02-22 NOTE — Plan of Care (Signed)

## 2023-02-23 ENCOUNTER — Other Ambulatory Visit (HOSPITAL_COMMUNITY): Payer: Self-pay

## 2023-02-23 LAB — CBC WITH DIFFERENTIAL/PLATELET
Abs Immature Granulocytes: 0.11 10*3/uL — ABNORMAL HIGH (ref 0.00–0.07)
Basophils Absolute: 0.1 10*3/uL (ref 0.0–0.1)
Basophils Relative: 1 %
Eosinophils Absolute: 1 10*3/uL — ABNORMAL HIGH (ref 0.0–0.5)
Eosinophils Relative: 11 %
HCT: 27.6 % — ABNORMAL LOW (ref 39.0–52.0)
Hemoglobin: 9.4 g/dL — ABNORMAL LOW (ref 13.0–17.0)
Immature Granulocytes: 1 %
Lymphocytes Relative: 34 %
Lymphs Abs: 3.1 10*3/uL (ref 0.7–4.0)
MCH: 37.5 pg — ABNORMAL HIGH (ref 26.0–34.0)
MCHC: 34.1 g/dL (ref 30.0–36.0)
MCV: 110 fL — ABNORMAL HIGH (ref 80.0–100.0)
Monocytes Absolute: 1.1 10*3/uL — ABNORMAL HIGH (ref 0.1–1.0)
Monocytes Relative: 12 %
Neutro Abs: 3.8 10*3/uL (ref 1.7–7.7)
Neutrophils Relative %: 41 %
Platelets: 97 10*3/uL — ABNORMAL LOW (ref 150–400)
RBC: 2.51 MIL/uL — ABNORMAL LOW (ref 4.22–5.81)
RDW: 19.4 % — ABNORMAL HIGH (ref 11.5–15.5)
WBC: 9.1 10*3/uL (ref 4.0–10.5)
nRBC: 0 % (ref 0.0–0.2)

## 2023-02-23 LAB — BASIC METABOLIC PANEL
Anion gap: 6 (ref 5–15)
BUN: 17 mg/dL (ref 6–20)
CO2: 26 mmol/L (ref 22–32)
Calcium: 8.3 mg/dL — ABNORMAL LOW (ref 8.9–10.3)
Chloride: 101 mmol/L (ref 98–111)
Creatinine, Ser: 0.94 mg/dL (ref 0.61–1.24)
GFR, Estimated: >60 mL/min (ref >60)
Glucose, Bld: 104 mg/dL — ABNORMAL HIGH (ref 70–99)
Potassium: 3.9 mmol/L (ref 3.5–5.1)
Sodium: 133 mmol/L — ABNORMAL LOW (ref 135–145)

## 2023-02-23 MED ORDER — ADULT MULTIVITAMIN W/MINERALS CH
1.0000 | ORAL_TABLET | Freq: Every day | ORAL | Status: DC
  Administered 2023-02-23 – 2023-03-02 (×8): 1 via ORAL
  Filled 2023-02-23 (×8): qty 1

## 2023-02-23 NOTE — Progress Notes (Signed)
  Progress Note   Patient: Nathaniel Warren [id number] DOB: [birth year] DOA: 01/22/2023     31 DOS: the patient was seen and examined on 02/23/2023   Brief hospital course:  Assessment and Plan: * Spontaneous hematoma of lower leg - Resolved   Alcoholic cirrhosis of liver with ascites (HCC) - Folic acid 1 mg PO daily  - Thiamine 100 mg PO daily  - Multivitamin 1 tab PO daily  - Lasix 40 mg PO daily  - Aldactone 50 mg pO daily  - IV morphine 2 mg q3 hr PRN  - Percocet 2 tab PO q4 hr PRN   Pressure injury of skin - Stage 2 left buttock. Wound care consulted.  Acute deep vein thrombosis (DVT) of brachial vein of left upper extremity (HCC) - Lovenox 80 mg sq q12   Suicidal ideations - Paxil 10 mg PO daily  - Seroquel 25 mg PO qhs  - Risperidone 2 mg pO q8 hr PRN  - Geodon 20 mg IM PRN  - No longer requires IVC. No need for inpatient psychiatric admission as per psychiatry      Tobacco abuse - Monitor   Hyponatremia - Monitor   MDD (major depressive disorder), recurrent severe, without psychosis ([place]) - Paxil as above   DVT prophylaxis: Lovenox 80 mg sq q12       Subjective: Pt seen and examined at the bedside.  PT note from today advised the pt would require SNF.   Physical Exam: Vitals:   02/22/23 0623 02/22/23 1315 02/22/23 2153 02/23/23 0545  BP: (!) 92/45 (!) 105/50 (!) 105/51 (!) 112/52  Pulse: 66 70 66 63  Resp: 17 18 17 18   Temp: 98.8 F (37.1 C) 98.6 F (37 C) 98.9 F (37.2 C) 98.3 F (36.8 C)  TempSrc:   Oral Oral  SpO2: 95% 96% 95% 96%  Weight:      Height:       Physical Exam Constitutional:      Appearance: Normal appearance.  HENT:     Head: Normocephalic.     Nose: Nose normal.     Mouth/Throat:     Mouth: Mucous membranes are moist.  Cardiovascular:     Rate and Rhythm: Normal rate and regular rhythm.  Pulmonary:     Effort: Pulmonary effort is normal.  Abdominal:     General: Abdomen is flat.     Palpations: Abdomen is soft.   Musculoskeletal:        General: Normal range of motion.     Cervical back: Neck supple.  Skin:    General: Skin is warm and dry.  Neurological:     Mental Status: He is alert and oriented to person, place, and time.  Psychiatric:        Mood and Affect: Mood normal.     Data Reviewed:   Disposition: Status is: Inpatient  Planned Discharge Destination: Home. However, barriers to discharge are that the pt's L hand is in a dressing and he is unable to pinch the skin to self administer the lovenox shots.     Time spent: 35 minutes  Author: Lucienne Minks , MD 02/23/2023 11:15 AM  For on call review [email or web address].

## 2023-02-23 NOTE — Progress Notes (Signed)
Physical Therapy Treatment Patient Details Name: Nathaniel Warren MRN: [id number] DOB: [birth year] Today's Date: 02/23/2023   History of Present Illness 57 yo male with onset of groin and RT dorsal hand bruising and edema with reported light trauma during a fall.  I&D to LT D4 on 02/03/23. Has h/o R foot drop without AFO. + for suicidal ideations.   PMHx:  etoh abuse, depression with h/o 4 suicide attempts, stroke, seizures, liver failure, R ankle fracture with ORIF, atherosclerosis, cirrhosis, diverticulosis, osteopenia.    PT Comments     Pt admitted with above diagnosis.  Pt currently with functional limitations due to the deficits listed below (see PT Problem List). Pt in bed and agreeable to participation with therapy, pt indicates generalized B UE and R LE 7/10 with mobility, B UE pitting edema L>R and nursing aware, MI supine to sit, A to don shoes seated EOB, min guard for STS from elevated EOB to L PFRW, improved gait tolerance with 102 feet with L PFRW, B shoes donned and min guard, pt elected to return to bed, noted difficulty with R LE with sit to supine trial with gait belt as leg lifter no apparent increased ease. Pt in bed all needs met, L UE elevated and nursing staff aware of B UE edema.  Pt will benefit from skilled PT to increase their independence and safety with mobility to allow discharge to the venue listed below.     Recommendations for follow up therapy are one component of a multi-disciplinary discharge planning process, led by the attending physician.  Recommendations may be updated based on patient status, additional functional criteria and insurance authorization.  Follow Up Recommendations  Skilled nursing-short term rehab (<3 hours/day) Can patient physically be transported by private vehicle: No   Assistance Recommended at Discharge Intermittent Supervision/Assistance  Patient can return home with the following A little help with walking and/or transfers;Direct  supervision/assist for medications management;Assistance with cooking/housework;Assist for transportation;A little help with bathing/dressing/bathroom;Help with stairs or ramp for entrance   Equipment Recommendations  Rolling walker (2 wheels);Other (comment) (LEFT platform)    Recommendations for Other Services       Precautions / Restrictions Precautions Precautions: Fall Precaution Comments: R foot drop, does better with his shoes on (less ankle roll) Required Braces or Orthoses: Splint/Cast Splint/Cast: LT ring finger Splint/Cast - Date Prophylactic Dressing Applied (if applicable):  (Applied by MD) Restrictions Weight Bearing Restrictions: No Other Position/Activity Restrictions: Limited LT hand WB since I&D to D4     Mobility  Bed Mobility Overal bed mobility: Modified Independent             General bed mobility comments: HOB elevated, use of R bed rail and increased time    Transfers Overall transfer level: Needs assistance Equipment used: Left platform walker Transfers: Sit to/from Stand Sit to Stand: Min guard, From elevated surface           General transfer comment: pt elects to pull with R UE on RW, palce L forearm on RW handle and push then transitioned L UE to platform, noted difficutly with power up    Ambulation/Gait Ambulation/Gait assistance: Min guard Gait Distance (Feet): 102 Feet (standing rest break at 51 feet secondary to R UE nerve pain) Assistive device: Left platform walker, Rolling walker (2 wheels) (R handle of RW built up due to pt reports of pain with R UE WB though hand) Gait Pattern/deviations: Step-through pattern, Steppage, Narrow base of support Gait velocity: decreased  General Gait Details: applied B shoes, R lateral ankle translation due to instability and weakness associated with fall in 08/2022. pt compensates with steppage pattern on R to clear R foot   Stairs             Wheelchair Mobility    Modified  Rankin (Stroke Patients Only)       Balance Overall balance assessment: Needs assistance Sitting-balance support: No upper extremity supported, Feet supported Sitting balance-Leahy Scale: Good     Standing balance support: During functional activity, Reliant on assistive device for balance Standing balance-Leahy Scale: Poor Standing balance comment: heavily reliant on walker                            Cognition Arousal/Alertness: Awake/alert Behavior During Therapy: WFL for tasks assessed/performed Overall Cognitive Status: Within Functional Limits for tasks assessed                                          Exercises      General Comments        Pertinent Vitals/Pain Pain Assessment Pain Assessment: 0-10 Pain Score: 7  Faces Pain Scale: Hurts a little bit Pain Location: R hand, L UE, R LE Pain Descriptors / Indicators: Tightness, Grimacing, Stabbing Pain Intervention(s): Limited activity within patient's tolerance, Monitored during session    Home Living Family/patient expects to be discharged to:: Private residence Living Arrangements: Other relatives (Cousin) Available Help at Discharge: Family;Available PRN/intermittently Type of Home: House Home Access: Stairs to enter Entrance Stairs-Rails: Left Entrance Stairs-Number of Steps: 2   Home Layout: One level Home Equipment: Conservation officer, nature (2 wheels);Crutches;Wheelchair - manual;Grab bars - tub/shower;Grab bars - toilet Additional Comments: declined wheelchair as he is unable to maneuver in his home    Prior Function            PT Goals (current goals can now be found in the care plan section) Acute Rehab PT Goals Patient Stated Goal: feel better/less edema in UE (pt is motivated to d/c home) PT Goal Formulation: With patient Time For Goal Achievement: 02/20/23 Potential to Achieve Goals: Good    Frequency    Min 3X/week      PT Plan      Co-evaluation               AM-PAC PT "6 Clicks" Mobility   Outcome Measure  Help needed turning from your back to your side while in a flat bed without using bedrails?: None Help needed moving from lying on your back to sitting on the side of a flat bed without using bedrails?: None Help needed moving to and from a bed to a chair (including a wheelchair)?: A Little Help needed standing up from a chair using your arms (e.g., wheelchair or bedside chair)?: A Little Help needed to walk in hospital room?: A Lot Help needed climbing 3-5 steps with a railing? : Total 6 Click Score: 17    End of Session Equipment Utilized During Treatment: Gait belt Activity Tolerance: Patient tolerated treatment well Patient left: in bed;with call bell/phone within reach Nurse Communication: Mobility status;Other (comment) (B UE edema L >R) PT Visit Diagnosis: Muscle weakness (generalized) (M62.81);Other abnormalities of gait and mobility (R26.89);History of falling (Z91.81)     Time: [id number] PT Time Calculation (min) (ACUTE ONLY): 35 min  Charges:  $Gait  Training: 8-22 mins $Therapeutic Activity: 8-22 mins                    Baird Lyons, PT   Adair Patter 02/23/2023, 10:41 AM

## 2023-02-23 NOTE — TOC Progression Note (Signed)
Transition of Care [facility]) - Progression Note    Patient Details  Name: Nathaniel Warren MRN: [id number] Date of Birth: [birth year]  Transition of Care [facility]) CM/SW [place], LCSW Phone Number: 02/23/2023, 12:18 PM  Clinical Narrative:    TOC is unable to find a facility to accept pt for SNF placement. Pt will need to d/c home.   CSW spoke with pt and informed him about his barriers to SNF placement. Pt reported he could be discharged back to his friend's home. Pt reported if he d/c today he will have a ride home. He reported he does not know about tomorrow. Pt inquired about his Medicaid status and food stamps. CSW informed him his Medicaid is still pending in our system. CSW suggested pt to follow up with the Department of Social Services when he is discharged. Pt inquired about applying for disability. CSW informed pt he would need to contact [place] to start the application. Pt also inquired about dressing supplies. Pt's bedside RN may provide pt with some supplies for a few days, however, pt will need to purchase supplies after.   Pt's walker will be deliver to his room prior to d/c. TOC to follow.   Expected Discharge Plan: Psychiatric Hospital Barriers to Discharge: Continued Medical Work up  Expected Discharge Plan and Services In-house Referral: Clinical Social Work Discharge Planning Services: CM Consult Post Acute Care Choice: Durable Medical Equipment Living arrangements for the past 2 months: Single Family Home                 DME Arranged: Geophysicist/field seismologist, Walker rolling DME Agency: [employer] Date DME Agency Contacted: 02/23/23 Time DME Agency Contacted: 1210 Representative spoke with at DME Agency: Erasmo Downer HH Arranged: NA           Social Determinants of Health ([place]) Interventions [place]: Food Insecurity Present (01/23/2023)  Housing: Medium Risk (01/23/2023)  Transportation Needs: Unmet  Transportation Needs (01/23/2023)  Utilities: Not At Risk (01/23/2023)  Alcohol Screen: Medium Risk (10/27/2017)  Depression (PHQ2-9): High Risk (12/18/2022)  Financial Resource Strain: High Risk (07/15/2022)  Physical Activity: Inactive (07/15/2022)  Social Connections: Socially Isolated (07/15/2022)  Stress: Stress Concern Present (07/15/2022)  Tobacco Use: High Risk (02/04/2023)    Readmission Risk Interventions     No data to display

## 2023-02-23 NOTE — TOC Progression Note (Signed)
Transition of Care [facility]) - Progression Note    Patient Details  Name: Nathaniel Warren MRN: [id number] Date of Birth: Apr 29, 1966  [facility]) CM/SW Contact  Loletha Grayer Beverely Pace, RN Phone Number: 02/23/2023, 1:16 PM  Clinical Narrative:    Case Manager contacted [employer] Liaison to get platform walker for patient. Erasmo Downer returned call stating patient got walker recently, they will arrange to add platform. CM also spoke with patient concerning getting his roommate to assist him with lovenox injections. Patient states that his roommate is not going to do it, and will not come in to learn how to do injections. MATCH will be entered when dc meds have been determined.    Expected Discharge Plan: Psychiatric Hospital Barriers to Discharge: Continued Medical Work up  Expected Discharge Plan and Services In-house Referral: Clinical Social Work Discharge Planning Services: CM Consult Post Acute Care Choice: Durable Medical Equipment Living arrangements for the past 2 months: Single Family Home                 DME Arranged: Geophysicist/field seismologist, Walker rolling DME Agency: [employer] Date DME Agency Contacted: 02/23/23 Time DME Agency Contacted: 1210 Representative spoke with at DME Agency: Erasmo Downer HH Arranged: NA           Social Determinants of Health ([place]) Interventions [place]: Food Insecurity Present (01/23/2023)  Housing: Medium Risk (01/23/2023)  Transportation Needs: Unmet Transportation Needs (01/23/2023)  Utilities: Not At Risk (01/23/2023)  Alcohol Screen: Medium Risk (10/27/2017)  Depression (PHQ2-9): High Risk (12/18/2022)  Financial Resource Strain: High Risk (07/15/2022)  Physical Activity: Inactive (07/15/2022)  Social Connections: Socially Isolated (07/15/2022)  Stress: Stress Concern Present (07/15/2022)  Tobacco Use: High Risk (02/04/2023)    Readmission Risk Interventions     No data to display

## 2023-02-24 LAB — COMPREHENSIVE METABOLIC PANEL
ALT: 20 U/L (ref 0–44)
AST: 63 U/L — ABNORMAL HIGH (ref 15–41)
Albumin: 2.1 g/dL — ABNORMAL LOW (ref 3.5–5.0)
Alkaline Phosphatase: 150 U/L — ABNORMAL HIGH (ref 38–126)
Anion gap: 8 (ref 5–15)
BUN: 17 mg/dL (ref 6–20)
CO2: 25 mmol/L (ref 22–32)
Calcium: 8.2 mg/dL — ABNORMAL LOW (ref 8.9–10.3)
Chloride: 102 mmol/L (ref 98–111)
Creatinine, Ser: 0.9 mg/dL (ref 0.61–1.24)
GFR, Estimated: >60 mL/min (ref >60)
Glucose, Bld: 94 mg/dL (ref 70–99)
Potassium: 3.9 mmol/L (ref 3.5–5.1)
Sodium: 135 mmol/L (ref 135–145)
Total Bilirubin: 5.5 mg/dL — ABNORMAL HIGH (ref 0.3–1.2)
Total Protein: 5.9 g/dL — ABNORMAL LOW (ref 6.5–8.1)

## 2023-02-24 LAB — PHOSPHORUS: Phosphorus: 4.3 mg/dL (ref 2.5–4.6)

## 2023-02-24 LAB — CBC
HCT: 27.4 % — ABNORMAL LOW (ref 39.0–52.0)
Hemoglobin: 9.3 g/dL — ABNORMAL LOW (ref 13.0–17.0)
MCH: 36.9 pg — ABNORMAL HIGH (ref 26.0–34.0)
MCHC: 33.9 g/dL (ref 30.0–36.0)
MCV: 108.7 fL — ABNORMAL HIGH (ref 80.0–100.0)
Platelets: 97 10*3/uL — ABNORMAL LOW (ref 150–400)
RBC: 2.52 MIL/uL — ABNORMAL LOW (ref 4.22–5.81)
RDW: 19.4 % — ABNORMAL HIGH (ref 11.5–15.5)
WBC: 9.4 10*3/uL (ref 4.0–10.5)
nRBC: 0 % (ref 0.0–0.2)

## 2023-02-24 LAB — MAGNESIUM: Magnesium: 1.7 mg/dL (ref 1.7–2.4)

## 2023-02-24 LAB — C-REACTIVE PROTEIN: CRP: 1 mg/dL — ABNORMAL HIGH (ref <1.0)

## 2023-02-24 NOTE — Progress Notes (Signed)
Patient administered Lovenox [place] with assistance. Patient unable to open package, remove the cap and squeeze/pinch abdomen. Despite RN's education and encouragement, patient insisted that his left hand and fingers are still weak.

## 2023-02-24 NOTE — TOC Progression Note (Signed)
Transition of Care [facility]) - Progression Note    Patient Details  Name: Nathaniel Warren MRN: [id number] Date of Birth: [birth year]  Transition of Care [facility]) CM/SW Contact  Shade Flood, LCSW Phone Number: 02/24/2023, 3:09 PM  Clinical Narrative:     TOC following. Per MD, anticipating dc tomorrow AM. TOC leadership following and will assist with MATCH and meds to bed to assist with a timely dc tomorrow AM.  Expected Discharge Plan: [facility] Barriers to Discharge: Continued Medical Work up  Expected Discharge Plan and Services In-house Referral: Clinical Social Work Discharge Planning Services: CM Consult Post Acute Care Choice: Durable Medical Equipment Living arrangements for the past 2 months: Single Family Home                 DME Arranged: Geophysicist/field seismologist, Walker rolling DME Agency: [employer] Date DME Agency Contacted: 02/23/23 Time DME Agency Contacted: 1210 Representative spoke with at DME Agency: Erasmo Downer HH Arranged: NA           Social Determinants of Health ([place]) Interventions [place]: Food Insecurity Present (01/23/2023)  Housing: Medium Risk (01/23/2023)  Transportation Needs: Unmet Transportation Needs (01/23/2023)  Utilities: Not At Risk (01/23/2023)  Alcohol Screen: Medium Risk (10/27/2017)  Depression (PHQ2-9): High Risk (12/18/2022)  Financial Resource Strain: High Risk (07/15/2022)  Physical Activity: Inactive (07/15/2022)  Social Connections: Socially Isolated (07/15/2022)  Stress: Stress Concern Present (07/15/2022)  Tobacco Use: High Risk (02/04/2023)    Readmission Risk Interventions     No data to display

## 2023-02-24 NOTE — Progress Notes (Signed)
Writer into patients room 3 times after OT saw patient. Writer encouraged doing exercises OT discussed with him each time. Patient stated he was being compliant and trying his best at the exercises.   Dewayne Hatch, RN

## 2023-02-24 NOTE — Progress Notes (Signed)
Patient is sitting at bedside is awake alert and oriented.  Left hand examined and there was a bandage that was all across his fingers totally encompassing preventing range of motion.  The bandage over the dorsal hand should stop at the distal palmar crease and proximal to the MCP joint to allow for finger range of motion and then a small bandage to the tip of the left ring finger.  All these bandages were taken down and the wounds were  assessed the swelling is down there is no further erythema no drainage.  The left ring finger shows no signs of infection is healing well with dry eschar present.  I instructed him on finger range of motion and wrist range of motion.  Daily dressing changes.  Okay to remove the bandages and get in the shower and let soapy water run down into his hand he can clean the wound as well with some peroxide if needed.  I instructed him in deta il on the wound care is required.  Overall he is had no subjective fevers his white blood cell count is down clinically no signs of infection he needs to continue working on finger range of motion.  His right side is doing excellent with complete resolution of the swelling on the right side and full range of motion of the wrist and digits.  No further treatments from my standpoint.  The patient can follow-up with me in the office 2 week s following the discharge.  Matt Holmes, MD

## 2023-02-24 NOTE — Progress Notes (Signed)
Occupational Therapy Treatment Patient Details Name: Nathaniel Warren MRN: [id number] DOB: 07-15-1966 Today's Date: 02/24/2023   History of present illness 57 yo male with onset of groin and RT dorsal hand bruising and edema with reported light trauma during a fall.  I&D to LT D4 on 02/03/23. Has h/o R foot drop without AFO. + for suicidal ideations.   PMHx:  etoh abuse, depression with h/o 4 suicide attempts, stroke, seizures, liver failure, R ankle fracture with ORIF, atherosclerosis, cirrhosis, diverticulosis, osteopenia.   OT comments  Patient progressing with ADLs and pt's RT hand is looking very good with resolved edema and intact ROM. Pt's LT hand unfortunately has regressed in ROM and has increased in pain with all touch and movement. OT addressed today with desensitization techniques, exercise as pt tolerated and gentle manual traction and PROM with slow stretches with pt having Wong/Baker 8 to 10/10 pain. Have updated pt's current goals to reflect importance of LT hand rehab and have increased his recommended OT frequency.   Patient remains limited by LT hand pain, weakness, limited ROM, hyper-sensitivity to touch, and edema. Pt continues to demonstrate good rehab potential and would benefit from continued skilled OT to increase safety and independence with ADLs and functional transfers to allow pt to return home safely and reduce caregiver burden and fall risk.    Recommendations for follow up therapy are one component of a multi-disciplinary discharge planning process, led by the attending physician.  Recommendations may be updated based on patient status, additional functional criteria and insurance authorization.    Follow Up Recommendations        Assistance Recommended at Discharge Frequent or constant Supervision/Assistance  Patient can return home with the following  A little help with bathing/dressing/bathroom;A little help with walking and/or transfers;Assistance with  cooking/housework;Assist for transportation;Direct supervision/assist for medications management   Equipment Recommendations  Tub/shower seat    Recommendations for Other Services      Precautions / Restrictions Precautions Precaution Comments: R foot drop, does better with his shoes on (less ankle roll) Required Braces or Orthoses: Splint/Cast Splint/Cast: LT ring finger Restrictions Weight Bearing Restrictions: No Other Position/Activity Restrictions: Limited LT hand WB since I&D to D4       Mobility Bed Mobility                    Transfers                         Balance                                           ADL either performed or assessed with clinical judgement   ADL Overall ADL's : Needs assistance/impaired (Worked on LT hand rehab to address need for pt to gain RT hand opposition and strength in order to give himself needed lovenox shots as an outpatient.)                                            Extremity/Trunk Assessment Upper Extremity Assessment LUE Deficits / Details: On 02/13/23: Edema: LT D2: 7.5 cm at PIP. D3: 7.6cm at PIP.  D3 to [facility]: 5.0 cm decreased distance from 5.5cm last session.  3/19-Unfortunately, pt has lost progress  with D3 to [facility]. Pt is just able to oppose D1 to D2 with increased time/effort/concentration, but not strength behind it. Did not measure D3 to [facility] due to pain but eyeballed looks close to 6-7cm. LUE: Unable to fully assess due to pain            Vision       Perception     Praxis      Cognition Arousal/Alertness: Awake/alert Behavior During Therapy: WFL for tasks assessed/performed                                            Exercises Other Exercises Other Exercises: LUE: Pt worked on 10 reps wrist flex/ext, wrist circles, forearm sup/pro, very slowly tolerating ~7 reps, then finger flexion/ext to tolerance but very limited and finger abd/add   but also limited. OT used gauze to set in between each finger on LT hand to promote gentle abduction latearlly as fingers are now crossng over each other, particularly D3 onto D4. Pt instructed how to adjust gauze as needed or remove if becomes bothersome. Pt simulated demonstration of understanding. Other Exercises: Prior to exercise: Manual traction to each LT finger (except D4) follwoed by passive ROM to digits on LT Other Exercises: Pt taught desentization techniques to LT hand. Pt began with his RT hand gently rubbing his dorsal LT hand and fingers but too painful. Pt was able to tolerate moving a pillowcase over his hand and instructed to start there and work up to hand rubbing, tapping then use of washcloth as tolerated. Pt verbalied understanding including doing this ad lib during all waking hours.    Shoulder Instructions       General Comments      Pertinent Vitals/ Pain       Pain Assessment Pain Assessment: PAINAD Faces Pain Scale: Hurts whole lot Breathing: occasional labored breathing, short period of hyperventilation Negative Vocalization: repeated troubled calling out, loud moaning/groaning, crying Facial Expression: facial grimacing Body Language: tense, distressed pacing, fidgeting Consolability: distracted or reassured by voice/touch PAINAD Score: 7 Pain Location: LT hand, esp dorsal hand and fingers. Increased pain with all touch and movement. Pain Descriptors / Indicators: Tightness, Grimacing, Stabbing Pain Intervention(s): Limited activity within patient's tolerance, Monitored during session, Repositioned (Educated pt on desensitization techniques.)  Home Living                                          Prior Functioning/Environment              Frequency  Min 3X/week        Progress Toward Goals  OT Goals(current goals can now be found in the care plan section)  Progress towards OT goals: Not progressing toward goals -  comment  Acute Rehab OT Goals Patient Stated Goal: Functional use of LT hand to squeeze stomach for lovenox injection. OT Goal Formulation: With patient Time For Goal Achievement: 03/10/23 Potential to Achieve Goals: Good  Plan Discharge plan remains appropriate;Frequency needs to be updated    Co-evaluation                 AM-PAC OT "6 Clicks" Daily Activity     Outcome Measure   Help from another person eating meals?: A Little Help from another person  taking care of personal grooming?: A Little Help from another person toileting, which includes using toliet, bedpan, or urinal?: A Little Help from another person bathing (including washing, rinsing, drying)?: A Little Help from another person to put on and taking off regular upper body clothing?: A Little Help from another person to put on and taking off regular lower body clothing?: A Little 6 Click Score: 18    End of Session    OT Visit Diagnosis: Pain;History of falling (Z91.81);Repeated falls (R29.6);Muscle weakness (generalized) (M62.81);Unsteadiness on feet (R26.81) Pain - Right/Left: Left Pain - part of body: Hand;Arm   Activity Tolerance Patient limited by pain   Patient Left in bed;with call bell/phone within reach;with nursing/sitter in room;with bed alarm set   Nurse Communication Other (comment) (LT hand rehab performed)        Time: [id number] OT Time Calculation (min): 36 min  Charges: OT General Charges $OT Visit: 1 Visit OT Treatments $Therapeutic Exercise: 23-37 mins  Anderson Malta, [place] Office: [phone] 02/24/2023   Julien Girt 02/24/2023, 3:13 PM

## 2023-02-24 NOTE — Progress Notes (Signed)
Based upon the pt's current L hand strength, he likely will require a 1-2 days of therapy until he is able to demonstrate adequately that he can use the L hand to squeeze his skin such that he can adequately inject lovenox

## 2023-02-24 NOTE — Progress Notes (Signed)
Patient educated on how to give himself lovenox injection. Patient was able to inject medication himself.   Dewayne Hatch, RN

## 2023-02-24 NOTE — Progress Notes (Signed)
Progress Note   Patient: Nathaniel Warren [id number] DOB: November 21, 1966 DOA: 01/22/2023     32 DOS: the patient was seen and examined on 02/24/2023   Brief hospital course:  Assessment and Plan: * Spontaneous hematoma of lower leg - Resolved    Alcoholic cirrhosis of liver with ascites (HCC) - Folic acid 1 mg PO daily  - Thiamine 100 mg PO daily  - Multivitamin 1 tab PO daily  - Lasix 40 mg PO daily  - Aldactone 50 mg pO daily  - IV morphine 2 mg q3 hr PRN  - Percocet 2 tab PO q4 hr PRN    Pressure injury of skin - Stage 2 left buttock. Wound care consulted.   Acute deep vein thrombosis (DVT) of brachial vein of left upper extremity (HCC) - Lovenox 80 mg sq q12    Suicidal ideations - Paxil 10 mg PO daily  - Seroquel 25 mg PO qhs  - Risperidone 2 mg pO q8 hr PRN  - Geodon 20 mg IM PRN  - No longer requires IVC. No need for inpatient psychiatric admission as per psychiatry       Tobacco abuse - Monitor    Hyponatremia - Monitor    MDD (major depressive disorder), recurrent severe, without psychosis ([place]) - Paxil as above    DVT prophylaxis: Lovenox 80 mg sq q12       Subjective: Pt seen and examined at the bedside. Dr. Greta Doom from ortho re-evaluated the pt today and adjusted the pt's dressing on the L hand. The dressing now stops at the base of the fingers to allow for the appropriate finger movement. Pt was given the spongy ball to help with strengthing his LEFT hand muscles.  Read the nursing documentation that the pt is "able to inject himself". I went into the room to confirm this with the pt's nurse as a witness at the bedside.  The pt was asked to pinch the abdominal skin with the left hand, however, he was still quite weak with the L hand. The pt's nurse was witness to this.  PT/OT to assist with improving the L hand movement such that the pt can be safely discharged. Nursing is going to page PT/OT to the room (they did come earlier but the pt was  busy).  This is a barrier to this pt's discharge.  Physical Exam: Vitals:   02/23/23 1526 02/23/23 2138 02/24/23 0613 02/24/23 1256  BP: 103/60 (!) 107/59 112/60 (!) 107/57  Pulse: 64 (!) 59 (!) 56 (!) 57  Resp: 20 17 17 17   Temp: 98.8 F (37.1 C) 98.6 F (37 C) 98.8 F (37.1 C) 98.5 F (36.9 C)  TempSrc: Oral Oral Oral Oral  SpO2: 99% 97% 98% 98%  Weight:      Height:       Constitutional:      Appearance: Normal appearance.  HENT:     Head: Normocephalic.     Nose: Nose normal.     Mouth/Throat:     Mouth: Mucous membranes are moist.  Cardiovascular:     Rate and Rhythm: Normal rate and regular rhythm.  Pulmonary:     Effort: Pulmonary effort is normal.  Abdominal:     General: Abdomen is flat.     Palpations: Abdomen is soft.  Musculoskeletal:        General: Normal range of motion.     Cervical back: Neck supple. L hand wrapped Skin:    General: Skin is warm and  dry.  Neurological:     Mental Status: He is alert and oriented to person, place, and time.  Psychiatric:        Mood and Affect: Mood normal.   Data Reviewed:   Disposition: Status is: Inpatient  Planned Discharge Destination: Home    Time spent: 35 minutes  Author: Lucienne Minks , MD 02/24/2023 1:44 PM  For on call review [email or web address].

## 2023-02-24 NOTE — Plan of Care (Signed)
Alert and oriented. Dressing on left hand reinforced. Patient refused dressing change. C/o pain, prn given.   Problem: Health Behavior/Discharge Planning: Goal: Ability to manage health-related needs will improve Outcome: Progressing   Problem: Clinical Measurements: Goal: Ability to maintain clinical measurements within normal limits will improve Outcome: Progressing Goal: Will remain free from infection Outcome: Progressing Goal: Diagnostic test results will improve Outcome: Progressing Goal: Respiratory complications will improve Outcome: Progressing Goal: Cardiovascular complication will be avoided Outcome: Progressing   Problem: Activity: Goal: Risk for activity intolerance will decrease Outcome: Progressing   Problem: Nutrition: Goal: Adequate nutrition will be maintained Outcome: Progressing   Problem: Coping: Goal: Level of anxiety will decrease Outcome: Progressing   Problem: Elimination: Goal: Will not experience complications related to bowel motility Outcome: Progressing   Problem: Pain Managment: Goal: General experience of comfort will improve Outcome: Progressing   Problem: Safety: Goal: Ability to remain free from injury will improve Outcome: Progressing   Problem: Skin Integrity: Goal: Risk for impaired skin integrity will decrease Outcome: Progressing

## 2023-02-25 DIAGNOSIS — I82622 Acute embolism and thrombosis of deep veins of left upper extremity: Secondary | ICD-10-CM

## 2023-02-25 DIAGNOSIS — S301XXA Contusion of abdominal wall, initial encounter: Secondary | ICD-10-CM

## 2023-02-25 DIAGNOSIS — E871 Hypo-osmolality and hyponatremia: Secondary | ICD-10-CM

## 2023-02-25 DIAGNOSIS — K7031 Alcoholic cirrhosis of liver with ascites: Secondary | ICD-10-CM

## 2023-02-25 LAB — C-REACTIVE PROTEIN: CRP: 0.8 mg/dL (ref <1.0)

## 2023-02-25 LAB — COMPREHENSIVE METABOLIC PANEL
ALT: 21 U/L (ref 0–44)
AST: 63 U/L — ABNORMAL HIGH (ref 15–41)
Albumin: 2.1 g/dL — ABNORMAL LOW (ref 3.5–5.0)
Alkaline Phosphatase: 135 U/L — ABNORMAL HIGH (ref 38–126)
Anion gap: 6 (ref 5–15)
BUN: 15 mg/dL (ref 6–20)
CO2: 25 mmol/L (ref 22–32)
Calcium: 8.3 mg/dL — ABNORMAL LOW (ref 8.9–10.3)
Chloride: 104 mmol/L (ref 98–111)
Creatinine, Ser: 0.84 mg/dL (ref 0.61–1.24)
GFR, Estimated: >60 mL/min (ref >60)
Glucose, Bld: 90 mg/dL (ref 70–99)
Potassium: 4.3 mmol/L (ref 3.5–5.1)
Sodium: 135 mmol/L (ref 135–145)
Total Bilirubin: 5.4 mg/dL — ABNORMAL HIGH (ref 0.3–1.2)
Total Protein: 6 g/dL — ABNORMAL LOW (ref 6.5–8.1)

## 2023-02-25 LAB — CBC
HCT: 27.5 % — ABNORMAL LOW (ref 39.0–52.0)
Hemoglobin: 9.4 g/dL — ABNORMAL LOW (ref 13.0–17.0)
MCH: 37.8 pg — ABNORMAL HIGH (ref 26.0–34.0)
MCHC: 34.2 g/dL (ref 30.0–36.0)
MCV: 110.4 fL — ABNORMAL HIGH (ref 80.0–100.0)
Platelets: 106 10*3/uL — ABNORMAL LOW (ref 150–400)
RBC: 2.49 MIL/uL — ABNORMAL LOW (ref 4.22–5.81)
RDW: 19.4 % — ABNORMAL HIGH (ref 11.5–15.5)
WBC: 8.9 10*3/uL (ref 4.0–10.5)
nRBC: 0 % (ref 0.0–0.2)

## 2023-02-25 LAB — PHOSPHORUS: Phosphorus: 4.1 mg/dL (ref 2.5–4.6)

## 2023-02-25 LAB — MAGNESIUM: Magnesium: 1.7 mg/dL (ref 1.7–2.4)

## 2023-02-25 NOTE — Progress Notes (Signed)
Physical Therapy Treatment Patient Details Name: Nathaniel Warren MRN: [id number] DOB: 1966/03/17 Today's Date: 02/25/2023   History of Present Illness 57 yo male with onset of groin and RT dorsal hand bruising and edema with reported light trauma during a fall.  I&D to LT D4 on 02/03/23. Has h/o R foot drop without AFO. + for suicidal ideations.   PMHx:  etoh abuse, depression with h/o 4 suicide attempts, stroke, seizures, liver failure, R ankle fracture with ORIF, atherosclerosis, cirrhosis, diverticulosis, osteopenia.    PT Comments     Pt admitted with above diagnosis.  Pt currently with functional limitations due to the deficits listed below (see PT Problem List). Pt is demonstrating progression with increased safety and I with functional mobility tasks including mod I with bed mobility, min guard for transfer tasks with cues bed and commode, increased gait tolerance of 118 feet with L PFRW and min guard progressing to close S. Pt reports ongoing L hand, 4th digit pain and difficulty with extension. Pt is agreeable to trials with step navigation at next visit. Pt left in bathroom seated on commode instructed to use pull cord for staff A and S and nursing aware.  Pt will benefit from skilled PT to increase their independence and safety with mobility to allow discharge to the venue listed below.     Recommendations for follow up therapy are one component of a multi-disciplinary discharge planning process, led by the attending physician.  Recommendations may be updated based on patient status, additional functional criteria and insurance authorization.  Follow Up Recommendations  Skilled nursing-short term rehab (<3 hours/day) Can patient physically be transported by private vehicle: No   Assistance Recommended at Discharge Intermittent Supervision/Assistance  Patient can return home with the following A little help with walking and/or transfers;Direct supervision/assist for medications  management;Assistance with cooking/housework;Assist for transportation;A little help with bathing/dressing/bathroom;Help with stairs or ramp for entrance   Equipment Recommendations  Rolling walker (2 wheels);Other (comment) (LEFT platform)    Recommendations for Other Services       Precautions / Restrictions Precautions Precautions: Fall Precaution Comments: R foot drop, does better with his shoes on (less ankle roll) Required Braces or Orthoses: Splint/Cast Splint/Cast: LT ring finger Splint/Cast - Date Prophylactic Dressing Applied (if applicable):  (Applied by MD) Restrictions Weight Bearing Restrictions: No Other Position/Activity Restrictions: Limited LT hand WB since I&D to D4     Mobility  Bed Mobility Overal bed mobility: Modified Independent             General bed mobility comments: HOB elevated, use of R bed rail and increased time    Transfers Overall transfer level: Needs assistance Equipment used: Left platform walker Transfers: Sit to/from Stand Sit to Stand: Min guard, From elevated surface           General transfer comment: pt elects to pull with R UE on RW, palce L forearm on RW handle and push then transitioned L UE to platform, noted difficutly with power up    Ambulation/Gait Ambulation/Gait assistance: Min guard Gait Distance (Feet): 118 Feet Assistive device: Left platform walker, Rolling walker (2 wheels) (R handle of RW built up due to pt reports of pain with R UE WB though hand) Gait Pattern/deviations: Step-through pattern, Steppage, Narrow base of support Gait velocity: decreased     General Gait Details: applied B shoes, R lateral ankle translation due to instability and weakness associated with fall in 08/2022. pt compensates with steppage pattern on R to clear  R foot   Stairs             Wheelchair Mobility    Modified Rankin (Stroke Patients Only)       Balance Overall balance assessment: Needs  assistance Sitting-balance support: No upper extremity supported, Feet supported Sitting balance-Leahy Scale: Good     Standing balance support: During functional activity, Reliant on assistive device for balance Standing balance-Leahy Scale: Poor                              Cognition Arousal/Alertness: Awake/alert Behavior During Therapy: WFL for tasks assessed/performed Overall Cognitive Status: Within Functional Limits for tasks assessed                                          Exercises      General Comments General comments (skin integrity, edema, etc.): B LE edema appears to be improved. pt indicated episode of abn breathing pattern 10/19 and states he did not want to tell anyone, no evidence or reports of abn RR or breathing pattern and pt deneis SOB      Pertinent Vitals/Pain Pain Assessment Pain Assessment: 0-10 Pain Score: 7  Pain Location: L hand Pain Descriptors / Indicators: Tightness, Grimacing, Stabbing    Home Living Family/patient expects to be discharged to:: Private residence Living Arrangements: Other relatives (Cousin) Available Help at Discharge: Family;Available PRN/intermittently Type of Home: House Home Access: Stairs to enter Entrance Stairs-Rails: Left Entrance Stairs-Number of Steps: 2   Home Layout: One level Home Equipment: Conservation officer, nature (2 wheels);Crutches;Wheelchair - manual;Grab bars - tub/shower;Grab bars - toilet Additional Comments: declined wheelchair as he is unable to maneuver in his home    Prior Function            PT Goals (current goals can now be found in the care plan section) Acute Rehab PT Goals Patient Stated Goal: feel better/less edema in UE (pt is motivated to d/c home) PT Goal Formulation: With patient Time For Goal Achievement: 02/20/23 Potential to Achieve Goals: Good    Frequency    Min 3X/week      PT Plan      Co-evaluation   Reason for Co-Treatment: For  patient/therapist safety;To address functional/ADL transfers PT goals addressed during session: Mobility/safety with mobility OT goals addressed during session: ADL's and self-care      AM-PAC PT "6 Clicks" Mobility   Outcome Measure  Help needed turning from your back to your side while in a flat bed without using bedrails?: None Help needed moving from lying on your back to sitting on the side of a flat bed without using bedrails?: None Help needed moving to and from a bed to a chair (including a wheelchair)?: A Little Help needed standing up from a chair using your arms (e.g., wheelchair or bedside chair)?: A Little Help needed to walk in hospital room?: A Lot Help needed climbing 3-5 steps with a railing? : Total 6 Click Score: 17    End of Session Equipment Utilized During Treatment: Gait belt Activity Tolerance: Patient tolerated treatment well Patient left: Other (comment) (on commode nursing staff aware and pt instructed to use pull cord when finished and ready to return to bed pt demonstrated verbal understanding) Nurse Communication: Mobility status;Other (comment) (B UE edema L >R) PT Visit Diagnosis: Muscle weakness (generalized) (M62.81);Other abnormalities  of gait and mobility (R26.89);History of falling (Z91.81)     Time: 1117-1140 PT Time Calculation (min) (ACUTE ONLY): 23 min  Charges:  $Gait Training: 8-22 mins $Therapeutic Activity: 8-22 mins                     Baird Lyons, PT    Adair Patter 02/25/2023, 12:12 PM

## 2023-02-25 NOTE — Plan of Care (Signed)

## 2023-02-25 NOTE — Progress Notes (Signed)
[facility]                                                                              Nathaniel Warren, is a 57 y.o. male, DOB - [birth year], [id number] Admit date - 01/22/2023    Outpatient Primary MD for the patient is [name], NP  LOS - 33  days  Chief Complaint  Patient presents with   Hand Pain   Groin Swelling       Brief summary   Patient is a 57 year old male with history of alcohol abuse/cirrhosis of liver, depression, chronic right ankle pain, suicidal attempts presented to the emergency department with complaint of left groin swelling, right arm swelling.  Found to have hematoma of the left groin as well as swelling of the right hand consistent with hematoma.  CT hand showed diffuse swelling without any abscess.  Hand surgery  consulted,recommended conservative management.    Hospital course remarkable for persistent pain and swelling of the right hand,finding of necrotic left 4th finger tip s/p left ring finger I & D and left dorsal hand abscess I&D. The patient was found to be positive for DVT of the right upper extremity on 02/19/2023. He was placed on apixaban for anticoagulation.  The patient is also noted to have swelling of the left upper extremity, elbow likely due to dependent edema.   PT/OT recommended SNF, TOC following for placement.     Pt's eliquis has been converted to lovenox at the suggestion of pharmacy as the patient is a Pugh-Childs Class C. The patient is being educated on administration of the lovenox.   Assessment & Plan    Principal Problem:   Spontaneous hematoma of lower leg -Resolving, H&H stable  Active Problems:   Alcoholic cirrhosis of liver with ascites ([place]), coagulopathy -Per patient, no longer drinking, MELD  25 on 2/15 -Continue Lasix, Aldactone    MDD (major depressive disorder), recurrent severe, without psychosis ([place]), suicidal ideations -Psychiatry was consulted, patient was placed on suicide  precautions -Currently stable, per psychiatry no longer requires IVC, no need for inpatient psychiatry admission. -Continue Paxil, Seroquel, risperidone, Geodon as needed    Hyponatremia -In the setting of cirrhosis -Improved, stable -Continue Lasix, Aldactone    Tobacco abuse -Counseled on smoking cessation, refused nicotine patch    Acute deep vein thrombosis (DVT) of brachial vein of left upper extremity ([place]) - As the patient is a Childs-Pugh C, pharmacy recommends changing the patient to lovenox 1 mg/kg instead of apixaban. -Per patient, he is having difficulty administering Lovenox    Pressure injury of skin Stage 2 left buttock. Wound care consulted.   Estimated body mass index is 25.3 kg/m as calculated from the following:   Height as of this encounter: 5\' 9"  (1.753 m).   Weight as of this encounter: 77.7 kg.  Code Status: Full code DVT Prophylaxis:   Lovenox   Level of Care: Level of care: Med-Surg Family Communication: Updated patient Disposition Plan:      Remains inpatient appropriate: PT recommended SNF   Procedures:    Consultants:     Antimicrobials:   Anti-infectives (From admission, onward)  Start     Dose/Rate Route Frequency Ordered Stop   02/08/23 1100  Oritavancin Diphosphate (ORBACTIV) 1,200 mg in dextrose 5 % IVPB        1,200 mg 333.3 mL/hr over 180 Minutes Intravenous Once 02/08/23 0935 02/08/23 1425   02/08/23 1030  metroNIDAZOLE (FLAGYL) tablet 500 mg        500 mg Oral Every 12 hours 02/08/23 0935 02/14/23 2115   02/07/23 1745  amoxicillin-clavulanate (AUGMENTIN) 875-125 MG per tablet 1 tablet  Status:  Discontinued        1 tablet Oral Every 12 hours 02/07/23 1659 02/08/23 0935   02/06/23 2200  cefadroxil (DURICEF) capsule 500 mg  Status:  Discontinued        500 mg Oral 2 times daily 02/06/23 0939 02/06/23 0950   02/06/23 2200  cefadroxil (DURICEF) capsule 1,000 mg  Status:  Discontinued        1,000 mg Oral 2 times daily 02/06/23  0950 02/07/23 1652   02/02/23 0900  cefTRIAXone (ROCEPHIN) 2 g in sodium chloride 0.9 % 100 mL IVPB  Status:  Discontinued        2 g 200 mL/hr over 30 Minutes Intravenous Every 24 hours 02/01/23 1044 02/06/23 0938   02/01/23 1200  cefTRIAXone (ROCEPHIN) 1 g in sodium chloride 0.9 % 100 mL IVPB  Status:  Discontinued        1 g 200 mL/hr over 30 Minutes Intravenous  Once 02/01/23 1101 02/01/23 1150   01/31/23 2200  vancomycin (VANCOCIN) IVPB 1000 mg/200 mL premix  Status:  Discontinued        1,000 mg 200 mL/hr over 60 Minutes Intravenous Every 12 hours 01/31/23 0840 02/06/23 0938   01/31/23 0900  cefTRIAXone (ROCEPHIN) 1 g in sodium chloride 0.9 % 100 mL IVPB  Status:  Discontinued        1 g 200 mL/hr over 30 Minutes Intravenous Every 24 hours 01/31/23 0811 02/01/23 1044   01/31/23 0900  vancomycin (VANCOREADY) IVPB 1500 mg/300 mL        1,500 mg 150 mL/hr over 120 Minutes Intravenous  Once 01/31/23 0840 01/31/23 1336          Medications  enoxaparin (LOVENOX) injection  80 mg Subcutaneous Q000111Q   folic acid  1 mg Oral Daily   furosemide  40 mg Oral Daily   hydrocerin   Topical BID   multivitamin with minerals  1 tablet Oral Daily   PARoxetine  10 mg Oral Daily   polyethylene glycol  17 g Oral Daily   QUEtiapine  25 mg Oral QHS   sodium chloride flush  3 mL Intravenous Q12H   spironolactone  50 mg Oral Daily   thiamine  100 mg Oral Daily      Subjective:   Nathaniel Warren was seen and examined today.  Reports that he is trying to inject the Lovenox however still having difficulty.  He states that he is able to pinch the abdominal skin but still feels weak in the left hand to be able to give the injection.  Patient denies dizziness, chest pain, shortness of breath, abdominal pain, N/V.  No acute events overnight  Objective:   Vitals:   02/24/23 1256 02/24/23 1940 02/25/23 0548 02/25/23 1417  BP: (!) 107/57 111/63 112/61 110/60  Pulse: (!) 57 63 (!) 56 70  Resp: 17 17 17  16   Temp: 98.5 F (36.9 C) 98.8 F (37.1 C) 98.8 F (37.1 C) 98.9 F (37.2 C)  TempSrc: Oral Oral Oral Oral  SpO2: 98% 99% 97% 97%  Weight:      Height:        Intake/Output Summary (Last 24 hours) at 02/25/2023 1441 Last data filed at 02/25/2023 0950 Gross per 24 hour  Intake --  Output 600 ml  Net -600 ml     Wt Readings from Last 3 Encounters:  01/23/23 77.7 kg  01/06/23 76.5 kg  12/26/22 76.7 kg     Exam General: Alert and oriented x 3, NAD Cardiovascular: S1 S2 auscultated,  RRR Respiratory: Clear to auscultation bilaterally, no wheezing Gastrointestinal: Soft, nontender, nondistended, + bowel sounds Ext: right upper arm dependent edema noted, dressing intact on left hand Neuro: no new deficits Psych: Normal affect and demeanor, alert and oriented x3     Data Reviewed:  I have personally reviewed following labs    CBC Lab Results  Component Value Date   WBC 8.9 02/25/2023   RBC 2.49 (L) 02/25/2023   HGB 9.4 (L) 02/25/2023   HCT 27.5 (L) 02/25/2023   MCV 110.4 (H) 02/25/2023   MCH 37.8 (H) 02/25/2023   PLT 106 (L) 02/25/2023   MCHC 34.2 02/25/2023   RDW 19.4 (H) 02/25/2023   LYMPHSABS 3.1 02/23/2023   MONOABS 1.1 (H) 02/23/2023   EOSABS 1.0 (H) 02/23/2023   BASOSABS 0.1 [id number]     Last metabolic panel Lab Results  Component Value Date   NA 135 02/25/2023   K 4.3 02/25/2023   CL 104 02/25/2023   CO2 25 02/25/2023   BUN 15 02/25/2023   CREATININE 0.84 02/25/2023   GLUCOSE 90 02/25/2023   GFRNONAA >60 02/25/2023   GFRAA >60 01/26/2018   CALCIUM 8.3 (L) 02/25/2023   PHOS 4.1 02/25/2023   PROT 6.0 (L) 02/25/2023   ALBUMIN 2.1 (L) 02/25/2023   LABGLOB 4.4 12/18/2022   AGRATIO 0.7 (L) 12/18/2022   BILITOT 5.4 (H) 02/25/2023   ALKPHOS 135 (H) 02/25/2023   AST 63 (H) 02/25/2023   ALT 21 02/25/2023   ANIONGAP 6 02/25/2023    CBG (last 3)  No results for input(s): "GLUCAP" in the last 72 hours.    Coagulation Profile: Recent Labs   Lab 02/19/23 1553 02/20/23 0450  INR 1.9* 2.7*     Radiology Studies: I have personally reviewed the imaging studies  No results found.     Estill Cotta M.D. [facility] 02/25/2023, 2:41 PM  Available via Epic secure chat 7am-7pm After 7 pm, please refer to night coverage provider listed on amion.

## 2023-02-25 NOTE — Plan of Care (Signed)
Dressing on left hand/finger dry and intact. C/o pain, medicated per MAR. Verbalized to have dressing change in the afternoon.  Problem: Health Behavior/Discharge Planning: Goal: Ability to manage health-related needs will improve Outcome: Progressing   Problem: Clinical Measurements: Goal: Ability to maintain clinical measurements within normal limits will improve Outcome: Progressing Goal: Will remain free from infection Outcome: Progressing Goal: Diagnostic test results will improve Outcome: Progressing Goal: Respiratory complications will improve Outcome: Progressing Goal: Cardiovascular complication will be avoided Outcome: Progressing   Problem: Activity: Goal: Risk for activity intolerance will decrease Outcome: Progressing   Problem: Nutrition: Goal: Adequate nutrition will be maintained Outcome: Progressing   Problem: Coping: Goal: Level of anxiety will decrease Outcome: Progressing   Problem: Elimination: Goal: Will not experience complications related to bowel motility Outcome: Progressing   Problem: Pain Managment: Goal: General experience of comfort will improve Outcome: Progressing   Problem: Safety: Goal: Ability to remain free from injury will improve Outcome: Progressing   Problem: Skin Integrity: Goal: Risk for impaired skin integrity will decrease Outcome: Progressing

## 2023-02-25 NOTE — Progress Notes (Addendum)
Pt unable to give lovenox shot this morning. Pt states he is unable to do because of the dressing and swelling to the left hand. Pt reported to RN that he did not give the lovenox shot to himself yesterday. RN will encourage pt to perform hand exercises and attempt the lovenox shot this evening.

## 2023-02-26 DIAGNOSIS — I82622 Acute embolism and thrombosis of deep veins of left upper extremity: Secondary | ICD-10-CM

## 2023-02-26 LAB — COMPREHENSIVE METABOLIC PANEL
ALT: 22 U/L (ref 0–44)
AST: 65 U/L — ABNORMAL HIGH (ref 15–41)
Albumin: 2.1 g/dL — ABNORMAL LOW (ref 3.5–5.0)
Alkaline Phosphatase: 148 U/L — ABNORMAL HIGH (ref 38–126)
Anion gap: 5 (ref 5–15)
BUN: 18 mg/dL (ref 6–20)
CO2: 27 mmol/L (ref 22–32)
Calcium: 8.3 mg/dL — ABNORMAL LOW (ref 8.9–10.3)
Chloride: 103 mmol/L (ref 98–111)
Creatinine, Ser: 0.94 mg/dL (ref 0.61–1.24)
GFR, Estimated: >60 mL/min (ref >60)
Glucose, Bld: 102 mg/dL — ABNORMAL HIGH (ref 70–99)
Potassium: 3.8 mmol/L (ref 3.5–5.1)
Sodium: 135 mmol/L (ref 135–145)
Total Bilirubin: 5.6 mg/dL — ABNORMAL HIGH (ref 0.3–1.2)
Total Protein: 6 g/dL — ABNORMAL LOW (ref 6.5–8.1)

## 2023-02-26 LAB — MAGNESIUM: Magnesium: 1.8 mg/dL (ref 1.7–2.4)

## 2023-02-26 LAB — CBC
HCT: 27.4 % — ABNORMAL LOW (ref 39.0–52.0)
Hemoglobin: 9.1 g/dL — ABNORMAL LOW (ref 13.0–17.0)
MCH: 36.8 pg — ABNORMAL HIGH (ref 26.0–34.0)
MCHC: 33.2 g/dL (ref 30.0–36.0)
MCV: 110.9 fL — ABNORMAL HIGH (ref 80.0–100.0)
Platelets: 100 10*3/uL — ABNORMAL LOW (ref 150–400)
RBC: 2.47 MIL/uL — ABNORMAL LOW (ref 4.22–5.81)
RDW: 19.1 % — ABNORMAL HIGH (ref 11.5–15.5)
WBC: 7.7 10*3/uL (ref 4.0–10.5)
nRBC: 0 % (ref 0.0–0.2)

## 2023-02-26 LAB — C-REACTIVE PROTEIN: CRP: 0.8 mg/dL (ref <1.0)

## 2023-02-26 LAB — PHOSPHORUS: Phosphorus: 4.3 mg/dL (ref 2.5–4.6)

## 2023-02-26 NOTE — Plan of Care (Signed)

## 2023-02-26 NOTE — Progress Notes (Signed)
Mobility Specialist - Progress Note   02/26/23 1231  Mobility  Activity Ambulated with assistance in hallway;Ambulated with assistance to bathroom  Level of Assistance Contact guard assist, steadying assist  Assistive Device Front wheel walker  Distance Ambulated (ft) 160 ft  Activity Response Tolerated well  Mobility Referral Yes  $Mobility charge 1 Mobility   Pt received in bed and agreeable to mobility. No complaints during session. Upon returning to room, requested assistance to bathroom. Instructed pt to pull call bell if assistance is needed. NT & nurse made aware. Pt to bathroom after session with all needs met.    [facility]

## 2023-02-26 NOTE — Progress Notes (Signed)
[facility]                                                                              Nathaniel Warren, is a 57 y.o. male, DOB - 05/02/1966, [id number] Admit date - 01/22/2023    Outpatient Primary MD for the patient is [name], NP  LOS - 34  days  Chief Complaint  Patient presents with   Hand Pain   Groin Swelling       Brief summary   Patient is a 57 year old male with history of alcohol abuse/cirrhosis of liver, depression, chronic right ankle pain, suicidal attempts presented to the emergency department with complaint of left groin swelling, right arm swelling.  Found to have hematoma of the left groin as well as swelling of the right hand consistent with hematoma.  CT hand showed diffuse swelling without any abscess.  Hand surgery  consulted,recommended conservative management.    Hospital course remarkable for persistent pain and swelling of the right hand,finding of necrotic left 4th finger tip s/p left ring finger I & D and left dorsal hand abscess I&D. The patient was found to be positive for DVT of the right upper extremity on 02/19/2023. He was placed on apixaban for anticoagulation.  The patient is also noted to have swelling of the left upper extremity, elbow likely due to dependent edema.   PT/OT recommended SNF, TOC following for placement.     Pt's eliquis has been converted to lovenox at the suggestion of pharmacy as the patient is a Pugh-Childs Class C. The patient is being educated on administration of the lovenox.   Assessment & Plan    Principal Problem:   Spontaneous hematoma of lower leg -Resolving, -H&H stable  Active Problems:   Alcoholic cirrhosis of liver with ascites ([place]), coagulopathy -Per patient, no longer drinking, MELD  25 on 2/15 -Continue Lasix, Aldactone    MDD (major depressive disorder), recurrent severe, without psychosis ([place]), suicidal ideations -Psychiatry was consulted, patient was placed on suicide  precautions -Currently stable, per psychiatry no longer requires IVC, no need for inpatient psychiatry admission. -Continue Paxil, Seroquel, risperidone, Geodon as needed    Hyponatremia -In the setting of cirrhosis -Continue Lasix, Aldactone - Na improved    Tobacco abuse -Counseled on smoking cessation, refused nicotine patch    Acute deep vein thrombosis (DVT) of brachial vein of left upper extremity ([place]) - As the patient is a Childs-Pugh C, pharmacy recommends changing the patient to lovenox 1 mg/kg instead of apixaban. -Patient reports that he still is having difficulty administering Lovenox.  Lives with a roommate who he states will not give him Lovenox due to fear of blood and needles.      Pressure injury of skin Stage 2 left buttock. Wound care consulted.   Estimated body mass index is 25.3 kg/m as calculated from the following:   Height as of this encounter: 5\' 9"  (1.753 m).   Weight as of this encounter: 77.7 kg.  Code Status: Full code DVT Prophylaxis:   Lovenox   Level of Care: Level of care: Med-Surg Family Communication: Updated patient Disposition Plan:  Remains inpatient appropriate: PT recommended SNF, counseled patient on working with PT OT and Lovenox injections.  Possible DC home as long as patient is able to give himself Lovenox.   Procedures:    Consultants:     Antimicrobials:   Anti-infectives (From admission, onward)    Start     Dose/Rate Route Frequency Ordered Stop   02/08/23 1100  Oritavancin Diphosphate (ORBACTIV) 1,200 mg in dextrose 5 % IVPB        1,200 mg 333.3 mL/hr over 180 Minutes Intravenous Once 02/08/23 0935 02/08/23 1425   02/08/23 1030  metroNIDAZOLE (FLAGYL) tablet 500 mg        500 mg Oral Every 12 hours 02/08/23 0935 02/14/23 2115   02/07/23 1745  amoxicillin-clavulanate (AUGMENTIN) 875-125 MG per tablet 1 tablet  Status:  Discontinued        1 tablet Oral Every 12 hours 02/07/23 1659 02/08/23 0935   02/06/23 2200   cefadroxil (DURICEF) capsule 500 mg  Status:  Discontinued        500 mg Oral 2 times daily 02/06/23 0939 02/06/23 0950   02/06/23 2200  cefadroxil (DURICEF) capsule 1,000 mg  Status:  Discontinued        1,000 mg Oral 2 times daily 02/06/23 0950 02/07/23 1652   02/02/23 0900  cefTRIAXone (ROCEPHIN) 2 g in sodium chloride 0.9 % 100 mL IVPB  Status:  Discontinued        2 g 200 mL/hr over 30 Minutes Intravenous Every 24 hours 02/01/23 1044 02/06/23 0938   02/01/23 1200  cefTRIAXone (ROCEPHIN) 1 g in sodium chloride 0.9 % 100 mL IVPB  Status:  Discontinued        1 g 200 mL/hr over 30 Minutes Intravenous  Once 02/01/23 1101 02/01/23 1150   01/31/23 2200  vancomycin (VANCOCIN) IVPB 1000 mg/200 mL premix  Status:  Discontinued        1,000 mg 200 mL/hr over 60 Minutes Intravenous Every 12 hours 01/31/23 0840 02/06/23 0938   01/31/23 0900  cefTRIAXone (ROCEPHIN) 1 g in sodium chloride 0.9 % 100 mL IVPB  Status:  Discontinued        1 g 200 mL/hr over 30 Minutes Intravenous Every 24 hours 01/31/23 0811 02/01/23 1044   01/31/23 0900  vancomycin (VANCOREADY) IVPB 1500 mg/300 mL        1,500 mg 150 mL/hr over 120 Minutes Intravenous  Once 01/31/23 0840 01/31/23 1336          Medications  enoxaparin (LOVENOX) injection  80 mg Subcutaneous Q000111Q   folic acid  1 mg Oral Daily   furosemide  40 mg Oral Daily   hydrocerin   Topical BID   multivitamin with minerals  1 tablet Oral Daily   PARoxetine  10 mg Oral Daily   polyethylene glycol  17 g Oral Daily   QUEtiapine  25 mg Oral QHS   sodium chloride flush  3 mL Intravenous Q12H   spironolactone  50 mg Oral Daily   thiamine  100 mg Oral Daily      Subjective:   Nathaniel Warren was seen and examined today.  No acute complaints.    Patient denies dizziness, chest pain, shortness of breath, abdominal pain, N/V.    Objective:   Vitals:   02/25/23 1417 02/25/23 2100 02/26/23 0500 02/26/23 1346  BP: 110/60 114/66 (!) 110/52 112/60  Pulse:  70 65 63 66  Resp: 16 17 17 18   Temp: 98.9 F (37.2 C) 98.9 F (  37.2 C) 98.2 F (36.8 C) 97.8 F (36.6 C)  TempSrc: Oral Oral Oral   SpO2: 97% 96% 96% 99%  Weight:      Height:        Intake/Output Summary (Last 24 hours) at 02/26/2023 1427 Last data filed at 02/25/2023 2005 Gross per 24 hour  Intake --  Output 200 ml  Net -200 ml     Wt Readings from Last 3 Encounters:  01/23/23 77.7 kg  01/06/23 76.5 kg  12/26/22 76.7 kg   Physical Exam General: Alert and oriented x 3, NAD Cardiovascular: S1 S2 clear, RRR.  Respiratory: CTAB, no wheezing, rales or rhonchi Gastrointestinal: Soft, nontender, nondistended, NBS Ext: LLE edema 1+, right arm edema has improved, dressing intact on the left hand. Neuro: no new deficits Psych: Normal affect    Data Reviewed:  I have personally reviewed following labs    CBC Lab Results  Component Value Date   WBC 7.7 02/26/2023   RBC 2.47 (L) 02/26/2023   HGB 9.1 (L) 02/26/2023   HCT 27.4 (L) 02/26/2023   MCV 110.9 (H) 02/26/2023   MCH 36.8 (H) 02/26/2023   PLT 100 (L) 02/26/2023   MCHC 33.2 02/26/2023   RDW 19.1 (H) 02/26/2023   LYMPHSABS 3.1 02/23/2023   MONOABS 1.1 (H) 02/23/2023   EOSABS 1.0 (H) 02/23/2023   BASOSABS 0.1 [id number]     Last metabolic panel Lab Results  Component Value Date   NA 135 02/26/2023   K 3.8 02/26/2023   CL 103 02/26/2023   CO2 27 02/26/2023   BUN 18 02/26/2023   CREATININE 0.94 02/26/2023   GLUCOSE 102 (H) 02/26/2023   GFRNONAA >60 02/26/2023   GFRAA >60 01/26/2018   CALCIUM 8.3 (L) 02/26/2023   PHOS 4.3 02/26/2023   PROT 6.0 (L) 02/26/2023   ALBUMIN 2.1 (L) 02/26/2023   LABGLOB 4.4 12/18/2022   AGRATIO 0.7 (L) 12/18/2022   BILITOT 5.6 (H) 02/26/2023   ALKPHOS 148 (H) 02/26/2023   AST 65 (H) 02/26/2023   ALT 22 02/26/2023   ANIONGAP 5 02/26/2023    CBG (last 3)  No results for input(s): "GLUCAP" in the last 72 hours.    Coagulation Profile: Recent Labs  Lab 02/19/23 1553  02/20/23 0450  INR 1.9* 2.7*     Radiology Studies: I have personally reviewed the imaging studies  No results found.     Estill Cotta M.D. [facility] 02/26/2023, 2:27 PM  Available via Epic secure chat 7am-7pm After 7 pm, please refer to night coverage provider listed on amion.

## 2023-02-26 NOTE — Progress Notes (Signed)
Pt educated on how to administer lovenox injection. RN demonstrated to pt how to pinch SQ tissue and administer the lovenox injection with the air bubble last. Pt demonstrated pinching skin. Pt could pinch up very small portion of SQ tissue only one time. Pt then stated he could not pinch the SQ tissue. RN had to assist pt with pinching SQ tissue. Pt was able to successfully inject lovenox himself. RN emphasized to pt the importance of completing finger/hand exercises regularly to build strength in left hand. Pt verbalized understanding.

## 2023-02-26 NOTE — Progress Notes (Signed)
Reinforced patient teaching on self administering lovenox SQ medication. Patient verbalizing difficulty of opening med package and pinching the abdomen. Encouraged patient to use left hand to help open the packaging, patient stated " I can't do it". Assitance provided by  this Probation officer by holding the medication. Patient used his right hand to peel open the lovenox packaging. NO difficulty verbalized. Alcohol swab given to patient and he prepped abdomen site. Patient directed from semi-fowlers to seated position on bed to pinch abdomen without use of left hand. Patient was able to administer medication without verbalizing difficulty. Patient has no questions and concerns after this activity.

## 2023-02-27 LAB — COMPREHENSIVE METABOLIC PANEL
ALT: 21 U/L (ref 0–44)
AST: 65 U/L — ABNORMAL HIGH (ref 15–41)
Albumin: 2 g/dL — ABNORMAL LOW (ref 3.5–5.0)
Alkaline Phosphatase: 139 U/L — ABNORMAL HIGH (ref 38–126)
Anion gap: 6 (ref 5–15)
BUN: 14 mg/dL (ref 6–20)
CO2: 25 mmol/L (ref 22–32)
Calcium: 8.2 mg/dL — ABNORMAL LOW (ref 8.9–10.3)
Chloride: 104 mmol/L (ref 98–111)
Creatinine, Ser: 0.85 mg/dL (ref 0.61–1.24)
GFR, Estimated: >60 mL/min (ref >60)
Glucose, Bld: 92 mg/dL (ref 70–99)
Potassium: 3.9 mmol/L (ref 3.5–5.1)
Sodium: 135 mmol/L (ref 135–145)
Total Bilirubin: 5.1 mg/dL — ABNORMAL HIGH (ref 0.3–1.2)
Total Protein: 5.8 g/dL — ABNORMAL LOW (ref 6.5–8.1)

## 2023-02-27 LAB — CBC
HCT: 28 % — ABNORMAL LOW (ref 39.0–52.0)
Hemoglobin: 9.3 g/dL — ABNORMAL LOW (ref 13.0–17.0)
MCH: 37.2 pg — ABNORMAL HIGH (ref 26.0–34.0)
MCHC: 33.2 g/dL (ref 30.0–36.0)
MCV: 112 fL — ABNORMAL HIGH (ref 80.0–100.0)
Platelets: 105 10*3/uL — ABNORMAL LOW (ref 150–400)
RBC: 2.5 MIL/uL — ABNORMAL LOW (ref 4.22–5.81)
RDW: 18.7 % — ABNORMAL HIGH (ref 11.5–15.5)
WBC: 9.7 10*3/uL (ref 4.0–10.5)
nRBC: 0 % (ref 0.0–0.2)

## 2023-02-27 LAB — C-REACTIVE PROTEIN: CRP: 0.7 mg/dL (ref <1.0)

## 2023-02-27 LAB — MAGNESIUM: Magnesium: 1.6 mg/dL — ABNORMAL LOW (ref 1.7–2.4)

## 2023-02-27 LAB — PHOSPHORUS: Phosphorus: 4 mg/dL (ref 2.5–4.6)

## 2023-02-27 NOTE — Progress Notes (Signed)
Rounded on patient today and encouraged pt to complete L hand exercises.  Pt c/o weakness and pain to L hand with movement. Observed pt complete L hand grip exercises with moderate mobility to L thumb and L index finger. Pt also complete grip exercises with exercise grip ball. Pt states he does not have difficulty with opening meal condiments, eating, or opening milk cartons. Pt OOB to recliner with use of walker and stand by assist.  Pt encouraged to remain out of bed for meals and to complete safe driven hand exercises numerous times a day to support his L hand mobility progression. Pt verbalized understanding and agreeable with plan.

## 2023-02-27 NOTE — Progress Notes (Signed)
Instructed patient to self administer IM lovenox with education. Had patient hold injection in L pointer and middle finger while using right hand to pull off cap. Pt appeared frustrated and verbally sighing agitated. Pt was unsuccessful with removing cap. Nurse instructed pt to use first three fingers on L side to pinch skin, patient said "I can't." Nurse had to pinch skin as well for patient to administer shot to abdomen.

## 2023-02-27 NOTE — Plan of Care (Signed)
  Problem: Health Behavior/Discharge Planning: Goal: Ability to manage health-related needs will improve Outcome: Progressing   Problem: Clinical Measurements: Goal: Will remain free from infection Outcome: Progressing Goal: Diagnostic test results will improve Outcome: Progressing   Problem: Coping: Goal: Level of anxiety will decrease Outcome: Progressing   Problem: Pain Managment: Goal: General experience of comfort will improve Outcome: Progressing   Problem: Skin Integrity: Goal: Risk for impaired skin integrity will decrease Outcome: Progressing

## 2023-02-27 NOTE — Progress Notes (Signed)
[facility]                                                                              Nathaniel Warren, is a 57 y.o. male, DOB - 10-06-1966, [id number] Admit date - 01/22/2023    Outpatient Primary MD for the patient is [name], NP  LOS - 35  days  Chief Complaint  Patient presents with   Hand Pain   Groin Swelling       Brief summary   Patient is a 57 year old male with history of alcohol abuse/cirrhosis of liver, depression, chronic right ankle pain, suicidal attempts presented to the emergency department with complaint of left groin swelling, right arm swelling.  Found to have hematoma of the left groin as well as swelling of the right hand consistent with hematoma.  CT hand showed diffuse swelling without any abscess.  Hand surgery  consulted,recommended conservative management.    Hospital course remarkable for persistent pain and swelling of the right hand,finding of necrotic left 4th finger tip s/p left ring finger I & D and left dorsal hand abscess I&D. The patient was found to be positive for DVT of the right upper extremity on 02/19/2023. He was placed on apixaban for anticoagulation.  The patient is also noted to have swelling of the left upper extremity, elbow likely due to dependent edema.   PT/OT recommended SNF, TOC following for placement.     Pt's eliquis has been converted to lovenox at the suggestion of pharmacy as the patient is a Pugh-Childs Class C. The patient is being educated on administration of the lovenox.   Assessment & Plan    Principal Problem:   Spontaneous hematoma of lower leg -Resolving, -H&H stable  Active Problems:   Alcoholic cirrhosis of liver with ascites ([place]), coagulopathy -Per patient, no longer drinking, MELD  25 on 2/15 -Continue Lasix, Aldactone    Acute deep vein thrombosis (DVT) of brachial vein of left upper extremity ([place]) - As the patient is a Childs-Pugh C, pharmacy recommends changing the patient  to lovenox 1 mg/kg instead of apixaban. -Patient reports that he still is having difficulty administering Lovenox.  Lives with a roommate who he states will not give him Lovenox due to fear of blood and needles.   -Overall improving, working with PT OT, recommended OOB bed to recliner, continue to work on his grip exercises for the left hand mobility.    MDD (major depressive disorder), recurrent severe, without psychosis ([place]), suicidal ideations -Psychiatry was consulted, patient was placed on suicide precautions -Currently stable, per psychiatry no longer requires IVC, no need for inpatient psychiatry admission. -Continue Paxil, Seroquel, risperidone, Geodon as needed    Hyponatremia -In the setting of cirrhosis -Continue Lasix, Aldactone - Na improved    Tobacco abuse -Counseled on smoking cessation, refused nicotine patch   Pressure injury of skin Stage 2 left buttock. Wound care consulted.   Estimated body mass index is 25.3 kg/m as calculated from the following:   Height as of this encounter: 5\' 9"  (1.753 m).   Weight as of this encounter: 77.7 kg.  Code Status: Full code DVT Prophylaxis:  Lovenox   Level of Care: Level of care: Med-Surg Family Communication: Updated patient Disposition Plan:      Remains inpatient appropriate:   Hopefully DC over the weekend to home if patient continues to demonstrate ability to give himself Lovenox independently   Procedures:    Consultants:     Antimicrobials:   Anti-infectives (From admission, onward)    Start     Dose/Rate Route Frequency Ordered Stop   02/08/23 1100  Oritavancin Diphosphate (ORBACTIV) 1,200 mg in dextrose 5 % IVPB        1,200 mg 333.3 mL/hr over 180 Minutes Intravenous Once 02/08/23 0935 02/08/23 1425   02/08/23 1030  metroNIDAZOLE (FLAGYL) tablet 500 mg        500 mg Oral Every 12 hours 02/08/23 0935 02/14/23 2115   02/07/23 1745  amoxicillin-clavulanate (AUGMENTIN) 875-125 MG per tablet 1 tablet   Status:  Discontinued        1 tablet Oral Every 12 hours 02/07/23 1659 02/08/23 0935   02/06/23 2200  cefadroxil (DURICEF) capsule 500 mg  Status:  Discontinued        500 mg Oral 2 times daily 02/06/23 0939 02/06/23 0950   02/06/23 2200  cefadroxil (DURICEF) capsule 1,000 mg  Status:  Discontinued        1,000 mg Oral 2 times daily 02/06/23 0950 02/07/23 1652   02/02/23 0900  cefTRIAXone (ROCEPHIN) 2 g in sodium chloride 0.9 % 100 mL IVPB  Status:  Discontinued        2 g 200 mL/hr over 30 Minutes Intravenous Every 24 hours 02/01/23 1044 02/06/23 0938   02/01/23 1200  cefTRIAXone (ROCEPHIN) 1 g in sodium chloride 0.9 % 100 mL IVPB  Status:  Discontinued        1 g 200 mL/hr over 30 Minutes Intravenous  Once 02/01/23 1101 02/01/23 1150   01/31/23 2200  vancomycin (VANCOCIN) IVPB 1000 mg/200 mL premix  Status:  Discontinued        1,000 mg 200 mL/hr over 60 Minutes Intravenous Every 12 hours 01/31/23 0840 02/06/23 0938   01/31/23 0900  cefTRIAXone (ROCEPHIN) 1 g in sodium chloride 0.9 % 100 mL IVPB  Status:  Discontinued        1 g 200 mL/hr over 30 Minutes Intravenous Every 24 hours 01/31/23 0811 02/01/23 1044   01/31/23 0900  vancomycin (VANCOREADY) IVPB 1500 mg/300 mL        1,500 mg 150 mL/hr over 120 Minutes Intravenous  Once 01/31/23 0840 01/31/23 1336          Medications  enoxaparin (LOVENOX) injection  80 mg Subcutaneous [id number]   folic acid  1 mg Oral Daily   furosemide  40 mg Oral Daily   hydrocerin   Topical BID   multivitamin with minerals  1 tablet Oral Daily   PARoxetine  10 mg Oral Daily   polyethylene glycol  17 g Oral Daily   QUEtiapine  25 mg Oral QHS   sodium chloride flush  3 mL Intravenous Q12H   spironolactone  50 mg Oral Daily   thiamine  100 mg Oral Daily      Subjective:   Nathaniel Warren was seen and examined today.  Currently no acute complaints except still feels difficulty giving himself Lovenox with limited mobility with his left hand.   Patient  denies dizziness, chest pain, shortness of breath, abdominal pain, N/V.    Objective:   Vitals:   02/26/23 0500 02/26/23 1346 02/26/23 1956 02/27/23  0458  BP: (!) 110/52 112/60 (!) 106/51 (!) 107/52  Pulse: 63 66 67 60  Resp: 17 18 18 18   Temp: 98.2 F (36.8 C) 97.8 F (36.6 C) 98.8 F (37.1 C) 98.6 F (37 C)  TempSrc: Oral  Oral Oral  SpO2: 96% 99% 98% 97%  Weight:      Height:        Intake/Output Summary (Last 24 hours) at 02/27/2023 1341 Last data filed at 02/27/2023 0930 Gross per 24 hour  Intake --  Output 750 ml  Net -750 ml     Wt Readings from Last 3 Encounters:  01/23/23 77.7 kg  01/06/23 76.5 kg  12/26/22 76.7 kg    Physical Exam General: Alert and oriented x 3, NAD Cardiovascular: S1 S2 clear, RRR.  Respiratory: CTAB, no wheezing Gastrointestinal: Soft, nontender, nondistended, NBS Ext: no pedal edema bilaterally, dressing intact left hand Neuro: no new deficits Psych: Normal affect    Data Reviewed:  I have personally reviewed following labs    CBC Lab Results  Component Value Date   WBC 9.7 02/27/2023   RBC 2.50 (L) 02/27/2023   HGB 9.3 (L) 02/27/2023   HCT 28.0 (L) 02/27/2023   MCV 112.0 (H) 02/27/2023   MCH 37.2 (H) 02/27/2023   PLT 105 (L) 02/27/2023   MCHC 33.2 02/27/2023   RDW 18.7 (H) 02/27/2023   LYMPHSABS 3.1 02/23/2023   MONOABS 1.1 (H) 02/23/2023   EOSABS 1.0 (H) 02/23/2023   BASOSABS 0.1 [id number]     Last metabolic panel Lab Results  Component Value Date   NA 135 02/27/2023   K 3.9 02/27/2023   CL 104 02/27/2023   CO2 25 02/27/2023   BUN 14 02/27/2023   CREATININE 0.85 02/27/2023   GLUCOSE 92 02/27/2023   GFRNONAA >60 02/27/2023   GFRAA >60 01/26/2018   CALCIUM 8.2 (L) 02/27/2023   PHOS 4.0 02/27/2023   PROT 5.8 (L) 02/27/2023   ALBUMIN 2.0 (L) 02/27/2023   LABGLOB 4.4 12/18/2022   AGRATIO 0.7 (L) 12/18/2022   BILITOT 5.1 (H) 02/27/2023   ALKPHOS 139 (H) 02/27/2023   AST 65 (H) 02/27/2023   ALT 21  02/27/2023   ANIONGAP 6 02/27/2023    CBG (last 3)  No results for input(s): "GLUCAP" in the last 72 hours.    Coagulation Profile: No results for input(s): "INR", "PROTIME" in the last 168 hours.    Radiology Studies: I have personally reviewed the imaging studies  No results found.     Estill Cotta M.D. [facility] 02/27/2023, 1:41 PM  Available via Epic secure chat 7am-7pm After 7 pm, please refer to night coverage provider listed on amion.

## 2023-02-27 NOTE — Progress Notes (Signed)
Physical Therapy Treatment Patient Details Name: Nathaniel Warren MRN: [id number] DOB: 1966-03-18 Today's Date: 02/27/2023   History of Present Illness 57 yo male with onset of groin and RT dorsal hand bruising and edema with reported light trauma during a fall.  I&D to LT D4 on 02/03/23. Has h/o R foot drop without AFO. + for suicidal ideations.   PMHx:  etoh abuse, depression with h/o 4 suicide attempts, stroke, seizures, liver failure, R ankle fracture with ORIF, atherosclerosis, cirrhosis, diverticulosis, osteopenia.    PT Comments     Pt admitted with above diagnosis.  Pt currently with functional limitations due to the deficits listed below (see PT Problem List). Pt seated in recliner when PT arrived, nursing staff present and PT able to communicate safety and progression with increased I in current setting with amb to and from commode and nursing in agreement and reported pt encouraged to get OOB and sit up in recliner as well as complete L UE exercises. Pt agreeable to therapy intervention and PT reviewed seated HEP for B UE and LE with pt requiring min cues and encouraged to complete HEP over weekend. PT initiated step navigation 1 8 inch step x 2 bouts at L PFRW level with min A and substantial cues. Pt amb 18 x 2 with L PFRW with improved stability today with PT progressing from min guard to intermittent close S. Pt left seated in recliner all needs met.  Pt will benefit from acute skilled PT to increase their independence and safety with mobility to allow discharge.     Recommendations for follow up therapy are one component of a multi-disciplinary discharge planning process, led by the attending physician.  Recommendations may be updated based on patient status, additional functional criteria and insurance authorization.  Follow Up Recommendations  Home health PT (pt will require ongoing therapy services in next venue) Can patient physically be transported by private vehicle: Yes    Assistance Recommended at Discharge Intermittent Supervision/Assistance  Patient can return home with the following A little help with walking and/or transfers;Direct supervision/assist for medications management;Assistance with cooking/housework;Assist for transportation;A little help with bathing/dressing/bathroom;Help with stairs or ramp for entrance   Equipment Recommendations  Rolling walker (2 wheels);Other (comment) (LEFT platform)    Recommendations for Other Services       Precautions / Restrictions Precautions Precautions: Fall Precaution Comments: R foot drop, does better with his shoes on (less ankle roll) Required Braces or Orthoses:  (bandage) Splint/Cast: LT ring finger Splint/Cast - Date Prophylactic Dressing Applied (if applicable):  (Applied by MD) Restrictions Weight Bearing Restrictions: No Other Position/Activity Restrictions: Limited LT hand WB since I&D to D4 secondary to pain report     Mobility  Bed Mobility               General bed mobility comments: pt seated in recliner today    Transfers Overall transfer level: Needs assistance Equipment used: Left platform walker Transfers: Sit to/from Stand Sit to Stand: Min guard           General transfer comment: pt seated in recliner and instructed on pushing with R UE from armrest and pt continues to use L forearm on RW    Ambulation/Gait Ambulation/Gait assistance: Min guard Gait Distance (Feet): 18 Feet (x 2) Assistive device: Left platform walker, Rolling walker (2 wheels) (R handle of RW built up due to pt reports of pain with R UE WB though hand) Gait Pattern/deviations: Step-through pattern, Steppage, Narrow base of support Gait  velocity: decreased     General Gait Details: applied B shoes, R lateral ankle translation due to instability and weakness associated with fall in 08/2022. pt compensates with steppage pattern on R to clear R foot   Stairs Stairs: Yes Stairs assistance: Min  assist Stair Management: No rails (L PFRW) Number of Stairs: 2 (8 inch) General stair comments: cues for safety, sequencing, placement of walker   Wheelchair Mobility    Modified Rankin (Stroke Patients Only)       Balance Overall balance assessment: Needs assistance Sitting-balance support: No upper extremity supported, Feet supported Sitting balance-Leahy Scale: Good     Standing balance support: During functional activity, Reliant on assistive device for balance Standing balance-Leahy Scale: Poor Standing balance comment: heavily reliant on walker                            Cognition Arousal/Alertness: Awake/alert Behavior During Therapy: WFL for tasks assessed/performed Overall Cognitive Status: Within Functional Limits for tasks assessed                                 General Comments: Very pleasant, motivated        Exercises General Exercises - Lower Extremity Ankle Circles/Pumps: AROM, Both, 10 reps Long Arc Quad: AROM, Both, 10 reps Hip ABduction/ADduction: AROM, Both, 10 reps, Seated Hip Flexion/Marching: AROM, Both, 10 reps, Seated Mini-Sqauts: AROM, Both, 5 reps    General Comments        Pertinent Vitals/Pain Pain Assessment Pain Assessment: 0-10 Pain Score: 7  Faces Pain Scale: Hurts whole lot Pain Location: L hand Pain Descriptors / Indicators: Tightness, Grimacing, Stabbing    Home Living                          Prior Function            PT Goals (current goals can now be found in the care plan section) Acute Rehab PT Goals Patient Stated Goal: feel better/less edema in R UE (pt is motivated to d/c home) PT Goal Formulation: With patient Time For Goal Achievement: 02/20/23 Potential to Achieve Goals: Good Progress towards PT goals: Progressing toward goals    Frequency    Min 3X/week      PT Plan Discharge plan needs to be updated    Co-evaluation   Reason for Co-Treatment: For  patient/therapist safety;To address functional/ADL transfers PT goals addressed during session: Mobility/safety with mobility OT goals addressed during session: ADL's and self-care      AM-PAC PT "6 Clicks" Mobility   Outcome Measure  Help needed turning from your back to your side while in a flat bed without using bedrails?: None Help needed moving from lying on your back to sitting on the side of a flat bed without using bedrails?: None Help needed moving to and from a bed to a chair (including a wheelchair)?: A Little Help needed standing up from a chair using your arms (e.g., wheelchair or bedside chair)?: A Little Help needed to walk in hospital room?: A Lot Help needed climbing 3-5 steps with a railing? : Total 6 Click Score: 17    End of Session Equipment Utilized During Treatment: Gait belt Activity Tolerance: Patient tolerated treatment well Patient left: in chair;with call bell/phone within reach;with chair alarm set Nurse Communication: Mobility status;Other (comment) (nurse ed provided on increased  ease with mobility tasks with L PFRW for gait to and from commode and donning B shoes) PT Visit Diagnosis: Muscle weakness (generalized) (M62.81);Other abnormalities of gait and mobility (R26.89);History of falling (Z91.81)     Time: [id number] PT Time Calculation (min) (ACUTE ONLY): 42 min  Charges:  $Gait Training: 8-22 mins $Therapeutic Exercise: 8-22 mins $Therapeutic Activity: 8-22 mins                     Baird Lyons, PT    Adair Patter 02/27/2023, 12:10 PM

## 2023-02-27 NOTE — Progress Notes (Signed)
Occupational Therapy Treatment Patient Details Name: Nathaniel Warren MRN: [id number] DOB: 1966/06/05 Today's Date: 02/27/2023   History of present illness 57 yo male with onset of groin and RT dorsal hand bruising and edema with reported light trauma during a fall.  I&D to LT D4 on 02/03/23. Has h/o R foot drop without AFO. + for suicidal ideations.   PMHx:  etoh abuse, depression with h/o 4 suicide attempts, stroke, seizures, liver failure, R ankle fracture with ORIF, atherosclerosis, cirrhosis, diverticulosis, osteopenia.   OT comments  Continue to emphasize elevation of L hand, rubbing and tapping and AAROM to L digits with use of R hand.  Patient verbalizing understanding, and has been completing desensitization and HEP discussed by OT in previous session.  No significant changes noted to edema and swelling, and pain to 3rd digit remains a deficit.  OT continues to be indicated in the acute setting to address deficits listed.  Hh OT vs outpatient OT is yet to be determined.        Recommendations for follow up therapy are one component of a multi-disciplinary discharge planning process, led by the attending physician.  Recommendations may be updated based on patient status, additional functional criteria and insurance authorization.    Follow Up Recommendations  Home health OT vs Outpatient OT    Assistance Recommended at Discharge Frequent or constant Supervision/Assistance  Patient can return home with the following  A little help with bathing/dressing/bathroom;A little help with walking and/or transfers;Assistance with cooking/housework;Assist for transportation;Direct supervision/assist for medications management   Equipment Recommendations  Tub/shower seat    Recommendations for Other Services      Precautions / Restrictions Precautions Precautions: Fall Precaution Comments: R foot drop, does better with his shoes on (less ankle roll) Required Braces or Orthoses:   (bandage) Splint/Cast: LT ring finger - kerlex and coban wrapping Splint/Cast - Date Prophylactic Dressing Applied (if applicable):  (Applied by MD) Restrictions Weight Bearing Restrictions: No Other Position/Activity Restrictions: patient limits LT hand WB since I&D to D4 secondary to pain report       Mobility Bed Mobility                    Transfers Overall transfer level: Needs assistance Equipment used: Left platform walker Transfers: Sit to/from Stand Sit to Stand: Min guard                 Balance   Sitting-balance support: No upper extremity supported, Feet supported       Standing balance support: During functional activity, Reliant on assistive device for balance Standing balance-Leahy Scale: Poor                             ADL either performed or assessed with clinical judgement   ADL                                              Extremity/Trunk Assessment Upper Extremity Assessment Upper Extremity Assessment: LUE deficits/detail RUE Deficits / Details: Continues: Now using RT hand functionally in all ADLs. Still with swelling and discoloration to dorsum of hand. Lots of peeling of skin on hand. LUE Deficits / Details: Continues: On 02/13/23: Edema: LT D2: 7.5 cm at PIP. D3: 7.6cm at PIP.  D3 to [facility]: 5.0 cm decreased distance from 5.5cm last  session.  3/19-Unfortunately, pt has lost progress with D3 to [facility]. Pt is just able to oppose D1 to D2 with increased time/effort/concentration, but not strength behind it. Did not measure D3 to [facility] due to pain but eyeballed looks close to 6-7cm. LUE Coordination: decreased fine motor   Lower Extremity Assessment Lower Extremity Assessment: Defer to PT evaluation                          Cognition Arousal/Alertness: Awake/alert Behavior During Therapy: WFL for tasks assessed/performed Overall Cognitive Status: Within Functional Limits for tasks assessed                                           Exercises General Exercises - Upper Extremity Shoulder Flexion: AROM, Both, 10 reps, Seated Elbow Flexion: AROM, Both, Seated Elbow Extension: AROM, Both, Seated Wrist Flexion: AROM, Both, Seated Wrist Extension: AROM, Both, Seated Other Exercises Other Exercises: continued L UE forearm supination and pronation seated to tolerance Other Exercises: continued desentization techniques:  patient able to tolerate increased pressure and AAROM to digits 1,2 and 4.  Limited at pinky and middle finger. Other Exercises: Left hand finger flexion except 4th digit    Shoulder Instructions       General Comments      Pertinent Vitals/ Pain       Pain Assessment Pain Assessment: Faces Faces Pain Scale: Hurts even more Pain Location: L hand Pain Descriptors / Indicators: Tender, Throbbing, Tightness, Sharp, Shooting, Jabbing Pain Intervention(s): Monitored during session                                                          Frequency  Min 3X/week        Progress Toward Goals  OT Goals(current goals can now be found in the care plan section)  Progress towards OT goals: Progressing toward goals  Acute Rehab OT Goals OT Goal Formulation: With patient Time For Goal Achievement: 03/10/23 Potential to Achieve Goals: Good  Plan Discharge plan remains appropriate    Co-evaluation      Reason for Co-Treatment: For patient/therapist safety;To address functional/ADL transfers PT goals addressed during session: Mobility/safety with mobility OT goals addressed during session: ADL's and self-care      AM-PAC OT "6 Clicks" Daily Activity     Outcome Measure   Help from another person eating meals?: A Little Help from another person taking care of personal grooming?: A Little Help from another person toileting, which includes using toliet, bedpan, or urinal?: A Little Help from another person bathing (including  washing, rinsing, drying)?: A Little Help from another person to put on and taking off regular upper body clothing?: A Little Help from another person to put on and taking off regular lower body clothing?: A Little 6 Click Score: 18    End of Session Equipment Utilized During Treatment: Rolling walker (2 wheels);Gait belt  OT Visit Diagnosis: Pain;History of falling (Z91.81);Repeated falls (R29.6);Muscle weakness (generalized) (M62.81);Unsteadiness on feet (R26.81) Pain - Right/Left: Left Pain - part of body: Hand;Arm   Activity Tolerance Patient limited by pain   Patient Left in chair;with call bell/phone within reach   Nurse Communication  Mobility status        Time: [id number] OT Time Calculation (min): 19 min  Charges: OT General Charges $OT Visit: 1 Visit OT Treatments $Therapeutic Activity: 8-22 mins  02/27/2023  RP, OTR/L  Acute Rehabilitation Services  Office:  [phone]   Metta Clines 02/27/2023, 3:44 PM

## 2023-02-28 LAB — CBC
HCT: 28 % — ABNORMAL LOW (ref 39.0–52.0)
Hemoglobin: 9.6 g/dL — ABNORMAL LOW (ref 13.0–17.0)
MCH: 37.6 pg — ABNORMAL HIGH (ref 26.0–34.0)
MCHC: 34.3 g/dL (ref 30.0–36.0)
MCV: 109.8 fL — ABNORMAL HIGH (ref 80.0–100.0)
Platelets: 108 10*3/uL — ABNORMAL LOW (ref 150–400)
RBC: 2.55 MIL/uL — ABNORMAL LOW (ref 4.22–5.81)
RDW: 18 % — ABNORMAL HIGH (ref 11.5–15.5)
WBC: 9.5 10*3/uL (ref 4.0–10.5)
nRBC: 0 % (ref 0.0–0.2)

## 2023-02-28 LAB — COMPREHENSIVE METABOLIC PANEL
ALT: 21 U/L (ref 0–44)
AST: 63 U/L — ABNORMAL HIGH (ref 15–41)
Albumin: 1.9 g/dL — ABNORMAL LOW (ref 3.5–5.0)
Alkaline Phosphatase: 126 U/L (ref 38–126)
Anion gap: 5 (ref 5–15)
BUN: 15 mg/dL (ref 6–20)
CO2: 26 mmol/L (ref 22–32)
Calcium: 8.2 mg/dL — ABNORMAL LOW (ref 8.9–10.3)
Chloride: 104 mmol/L (ref 98–111)
Creatinine, Ser: 0.84 mg/dL (ref 0.61–1.24)
GFR, Estimated: >60 mL/min (ref >60)
Glucose, Bld: 93 mg/dL (ref 70–99)
Potassium: 4 mmol/L (ref 3.5–5.1)
Sodium: 135 mmol/L (ref 135–145)
Total Bilirubin: 4.9 mg/dL — ABNORMAL HIGH (ref 0.3–1.2)
Total Protein: 5.8 g/dL — ABNORMAL LOW (ref 6.5–8.1)

## 2023-02-28 LAB — PHOSPHORUS: Phosphorus: 4 mg/dL (ref 2.5–4.6)

## 2023-02-28 LAB — MAGNESIUM: Magnesium: 1.8 mg/dL (ref 1.7–2.4)

## 2023-02-28 LAB — C-REACTIVE PROTEIN: CRP: 0.5 mg/dL (ref <1.0)

## 2023-02-28 NOTE — Progress Notes (Addendum)
When writer approached room to administer morning medications and assess patient, he was found using both hands to open breakfast packaging on his own with no difficulty. Patient seemed able to squeeze left hand first three fingers with not much difficulty.   Patient was instructed to give himself Lovenox injection. Patient able to walk me through steps correctly. Patient able to remove alcohol swab and wipe his stomach. Patient able to remove Lovenox injection from packaging on his own. Patient refused to attempt to remove cap of injection on his own. He stated "my fingers are too weak, I am unable to hold and pull, it just slips through my fingers". When reminding patient to pinch skin before administering the injection, patient stated "I am unable to pinch my skin, my hand is too weak". Patient insisted his first three fingers were unable to squeeze together to pinch skin and patient initially unwilling to attempt. Writer discussed with patient that upon entering room it did not seem as though patient was having this difficulty with his left hand when opening his breakfast. Writer encouraged patient to attempt pinch skin for injection and he was able to do so. Patient successfully gave himself his Lovenox injection on the right side of his abdomen.   Discussed with patient that dressing change will need to be done today to his left hand and provider is encouraging that he do it on his own. Patient agreeable, states he would like to do it in the afternoon.  1450- Wound care on left hand completed by patient with very minimal assistance. Patient was able to get old dressings removed independently. Patient able to prepare supplies needed independently other than needing assistance to cut nonstick petroleum gauze. Patient able to complete wound care with antibiotic ointment, nonstick petroleum gauze, 4X4, and ace wrap successfully. Patient stated "My room mate is able to help me set everything up and cut  anything that is needed. He just does not want to be involved with any blood or shots". Based on that statement and the dressing change observed today, patient should be able to independently do dressing changes daily.   Dewayne Hatch, RN

## 2023-02-28 NOTE — Progress Notes (Signed)
Mobility Specialist - Progress Note   02/28/23 1354  Mobility  Activity Ambulated with assistance in hallway  Level of Assistance Standby assist, set-up cues, supervision of patient - no hands on  Assistive Device Front wheel walker  Distance Ambulated (ft) 160 ft  Activity Response Tolerated well  Mobility Referral Yes  $Mobility charge 1 Mobility   Pt received in bed and agreeable to mobility. No complaints during session. Pt to bed after session with all needs met.     [facility]

## 2023-02-28 NOTE — Plan of Care (Signed)
  Problem: Health Behavior/Discharge Planning: Goal: Ability to manage health-related needs will improve Outcome: Progressing   Problem: Clinical Measurements: Goal: Will remain free from infection Outcome: Progressing   Problem: Activity: Goal: Risk for activity intolerance will decrease Outcome: Progressing   Problem: Coping: Goal: Level of anxiety will decrease Outcome: Progressing   Problem: Pain Managment: Goal: General experience of comfort will improve Outcome: Progressing   Problem: Safety: Goal: Ability to remain free from injury will improve Outcome: Progressing   Problem: Skin Integrity: Goal: Risk for impaired skin integrity will decrease Outcome: Progressing   

## 2023-02-28 NOTE — Progress Notes (Signed)
Reinforced teaching on lovenox SQ injection. Patient repositioned self HOB 90 degrees on bed. SQ injection to self given on abdomen without difficulty with minimal assistance provided as patient has limited left hand movement.  Well tolerated. NO concerns or questions expressed.

## 2023-02-28 NOTE — Progress Notes (Signed)
Patient wants to continue sleeping, refused dressing change this time.

## 2023-02-28 NOTE — Progress Notes (Addendum)
[facility]                                                                              Nathaniel Warren, is a 57 y.o. male, DOB - 27-May-1966, [id number] Admit date - 01/22/2023    Outpatient Primary MD for the patient is Nathaniel Foy, NP  LOS - 36  days  Chief Complaint  Patient presents with   Hand Pain   Groin Swelling       Brief summary   Patient is a 57 year old male with history of alcohol abuse/cirrhosis of liver, depression, chronic right ankle pain, suicidal attempts presented to the emergency department with complaint of left groin swelling, right arm swelling.  Found to have hematoma of the left groin as well as swelling of the right hand consistent with hematoma.  CT hand showed diffuse swelling without any abscess.  Hand surgery  consulted,recommended conservative management.    Hospital course remarkable for persistent pain and swelling of the right hand,finding of necrotic left 4th finger tip s/p left ring finger I & D and left dorsal hand abscess I&D. The patient was found to be positive for DVT of the right upper extremity on 02/19/2023. He was placed on apixaban for anticoagulation.  The patient is also noted to have swelling of the left upper extremity, elbow likely due to dependent edema.   PT/OT recommended SNF, TOC following for placement.     Pt's eliquis has been converted to lovenox at the suggestion of pharmacy as the patient is a Pugh-Childs Class C. The patient is being educated on administration of the lovenox.  Plan to DC home (lives with a friend) once patient demonstrates ability to do Lovenox injection and dressing changes to left hand independently.  Patient has not been able to qualify for SNF.  Assessment & Plan    Principal Problem:   Spontaneous hematoma of lower leg -Resolving, -H&H stable  Active Problems:   Alcoholic cirrhosis of liver with ascites ([place]), coagulopathy -Per patient, no longer drinking, MELD  25 on  2/15 -Continue Lasix, Aldactone    Acute deep vein thrombosis (DVT) of brachial vein of left upper extremity ([name]) - As the patient is a Childs-Pugh C, pharmacy recommends changing the patient to lovenox 1 mg/kg instead of apixaban/DOACs. -Patient reports that he still is having difficulty administering Lovenox.  Lives with a roommate who he states will not give him Lovenox due to fear of blood and needles.   -working with PT OT, recommended OOB bed to recliner, continue to work on his grip exercises for the left hand mobility. -Counseled patient that he will have to demonstrate Lovenox injection and dressing changes today independently to the RN for disposition.  Right/left hand hematoma, necrotic left fourth fingertip - MRI of the finger did not show any abscess or osteomyelitis, s/p I&D with cultures on 2/27 by Dr. Orene Desanctis.  -Reevaluated by Dr. Greta Doom on 3/19 (see note), no signs of infection, need to continue working on finger range of motion. Daily dressing changes. Okay to remove the bandages and get in the shower and let soapy water run down into his hand  he can clean the wound as well with some peroxide if needed.  Outpatient follow-up with Dr. Greta Doom in the office 2 weeks following discharge -Patient has completed antibiotic course -Counseled patient that he will have to demonstrate dressing changes today independently for the disposition    MDD (major depressive disorder), recurrent severe, without psychosis ([name]), suicidal ideations -Psychiatry was consulted, patient was placed on suicide precautions -Currently stable, per psychiatry no longer requires IVC, no need for inpatient psychiatry admission. -Continue Paxil, Seroquel, risperidone, Geodon as needed    Hyponatremia -In the setting of cirrhosis -Continue Lasix, Aldactone - Na improved    Tobacco abuse -Counseled on smoking cessation, refused nicotine patch   Pressure injury of skin Stage 2 left buttock. Wound care  consulted.   Estimated body mass index is 25.3 kg/m as calculated from the following:   Height as of this encounter: 5\' 9"  (1.753 m).   Weight as of this encounter: 77.7 kg.  Code Status: Full code DVT Prophylaxis:   Lovenox   Level of Care: Level of care: Med-Surg Family Communication: Updated patient Disposition Plan:      Remains inpatient appropriate:   Hopefully DC tomorrow or Monday if patient continues to demonstrate ability to give himself Lovenox and able to do dressing changes on the left hand independent daily   Procedures:    Consultants:   Orthopedics Psychiatry  Antimicrobials:   Anti-infectives (From admission, onward)    Start     Dose/Rate Route Frequency Ordered Stop   02/08/23 1100  Oritavancin Diphosphate (ORBACTIV) 1,200 mg in dextrose 5 % IVPB        1,200 mg 333.3 mL/hr over 180 Minutes Intravenous Once 02/08/23 0935 02/08/23 1425   02/08/23 1030  metroNIDAZOLE (FLAGYL) tablet 500 mg        500 mg Oral Every 12 hours 02/08/23 0935 02/14/23 2115   02/07/23 1745  amoxicillin-clavulanate (AUGMENTIN) 875-125 MG per tablet 1 tablet  Status:  Discontinued        1 tablet Oral Every 12 hours 02/07/23 1659 02/08/23 0935   02/06/23 2200  cefadroxil (DURICEF) capsule 500 mg  Status:  Discontinued        500 mg Oral 2 times daily 02/06/23 0939 02/06/23 0950   02/06/23 2200  cefadroxil (DURICEF) capsule 1,000 mg  Status:  Discontinued        1,000 mg Oral 2 times daily 02/06/23 0950 02/07/23 1652   02/02/23 0900  cefTRIAXone (ROCEPHIN) 2 g in sodium chloride 0.9 % 100 mL IVPB  Status:  Discontinued        2 g 200 mL/hr over 30 Minutes Intravenous Every 24 hours 02/01/23 1044 02/06/23 0938   02/01/23 1200  cefTRIAXone (ROCEPHIN) 1 g in sodium chloride 0.9 % 100 mL IVPB  Status:  Discontinued        1 g 200 mL/hr over 30 Minutes Intravenous  Once 02/01/23 1101 02/01/23 1150   01/31/23 2200  vancomycin (VANCOCIN) IVPB 1000 mg/200 mL premix  Status:  Discontinued         1,000 mg 200 mL/hr over 60 Minutes Intravenous Every 12 hours 01/31/23 0840 02/06/23 0938   01/31/23 0900  cefTRIAXone (ROCEPHIN) 1 g in sodium chloride 0.9 % 100 mL IVPB  Status:  Discontinued        1 g 200 mL/hr over 30 Minutes Intravenous Every 24 hours 01/31/23 0811 02/01/23 1044   01/31/23 0900  vancomycin (VANCOREADY) IVPB 1500 mg/300 mL  1,500 mg 150 mL/hr over 120 Minutes Intravenous  Once 01/31/23 0840 01/31/23 1336          Medications  enoxaparin (LOVENOX) injection  80 mg Subcutaneous Q000111Q   folic acid  1 mg Oral Daily   furosemide  40 mg Oral Daily   hydrocerin   Topical BID   multivitamin with minerals  1 tablet Oral Daily   PARoxetine  10 mg Oral Daily   polyethylene glycol  17 g Oral Daily   QUEtiapine  25 mg Oral QHS   sodium chloride flush  3 mL Intravenous Q12H   spironolactone  50 mg Oral Daily   thiamine  100 mg Oral Daily      Subjective:   Nathaniel Warren was seen and examined today.  No acute complaints, states that he will try giving himself Lovenox and do the dressing changes on the left hand independently with RN supervision.     Objective:   Vitals:   02/27/23 0458 02/27/23 1405 02/27/23 2023 02/28/23 0440  BP: (!) 107/52 (!) 142/66 (!) 115/58 (!) 138/58  Pulse: 60 (!) 56 (!) 59 (!) 58  Resp: 18 15 18 18   Temp: 98.6 F (37 C) 98.7 F (37.1 C) 98.7 F (37.1 C) 98.3 F (36.8 C)  TempSrc: Oral Oral Oral Oral  SpO2: 97% 99% 99% 96%  Weight:      Height:        Intake/Output Summary (Last 24 hours) at 02/28/2023 1202 Last data filed at 02/28/2023 0955 Gross per 24 hour  Intake 460 ml  Output 625 ml  Net -165 ml     Wt Readings from Last 3 Encounters:  01/23/23 77.7 kg  01/06/23 76.5 kg  12/26/22 76.7 kg   Physical Exam General: Alert and oriented x 3, NAD Cardiovascular: S1 S2 clear, RRR.  Respiratory: CTAB, no wheezing Gastrointestinal: Soft, nontender, nondistended, NBS Ext: no pedal edema bilaterally, right arm  edema improved Neuro: no new deficits Skin: Left hand dressing intact Psych: Normal affect   Data Reviewed:  I have personally reviewed following labs    CBC Lab Results  Component Value Date   WBC 9.5 02/28/2023   RBC 2.55 (L) 02/28/2023   HGB 9.6 (L) 02/28/2023   HCT 28.0 (L) 02/28/2023   MCV 109.8 (H) 02/28/2023   MCH 37.6 (H) 02/28/2023   PLT 108 (L) 02/28/2023   MCHC 34.3 02/28/2023   RDW 18.0 (H) 02/28/2023   LYMPHSABS 3.1 02/23/2023   MONOABS 1.1 (H) 02/23/2023   EOSABS 1.0 (H) 02/23/2023   BASOSABS 0.1 [id number]     Last metabolic panel Lab Results  Component Value Date   NA 135 02/28/2023   K 4.0 02/28/2023   CL 104 02/28/2023   CO2 26 02/28/2023   BUN 15 02/28/2023   CREATININE 0.84 02/28/2023   GLUCOSE 93 02/28/2023   GFRNONAA >60 02/28/2023   GFRAA >60 01/26/2018   CALCIUM 8.2 (L) 02/28/2023   PHOS 4.0 02/28/2023   PROT 5.8 (L) 02/28/2023   ALBUMIN 1.9 (L) 02/28/2023   LABGLOB 4.4 12/18/2022   AGRATIO 0.7 (L) 12/18/2022   BILITOT 4.9 (H) 02/28/2023   ALKPHOS 126 02/28/2023   AST 63 (H) 02/28/2023   ALT 21 02/28/2023   ANIONGAP 5 02/28/2023    CBG (last 3)  No results for input(s): "GLUCAP" in the last 72 hours.    Coagulation Profile: No results for input(s): "INR", "PROTIME" in the last 168 hours.    Radiology Studies: I have  personally reviewed the imaging studies  No results found.     Estill Cotta M.D. [facility] 02/28/2023, 12:02 PM  Available via Epic secure chat 7am-7pm After 7 pm, please refer to night coverage provider listed on amion.

## 2023-03-01 NOTE — Progress Notes (Signed)
Mobility Specialist - Progress Note   03/01/23 1319  Mobility  Activity Ambulated with assistance in hallway  Level of Assistance Standby assist, set-up cues, supervision of patient - no hands on  Assistive Device Front wheel walker  Distance Ambulated (ft) 160 ft  Activity Response Tolerated well  Mobility Referral Yes  $Mobility charge 1 Mobility   Pt received in bed and agreeable to mobility. No complaints during session. Pt to bed after session with all needs met.    [facility]

## 2023-03-01 NOTE — Progress Notes (Signed)
[facility]                                                                              Nathaniel Warren, is a 57 y.o. male, DOB - [birth year], [id number] Admit date - 01/22/2023    Outpatient Primary MD for the patient is [name], NP  LOS - 37  days  Chief Complaint  Patient presents with   Hand Pain   Groin Swelling       Brief summary   Patient is a 57 year old male with history of alcohol abuse/cirrhosis of liver, depression, chronic right ankle pain, suicidal attempts presented to the emergency department with complaint of left groin swelling, right arm swelling.  Found to have hematoma of the left groin as well as swelling of the right hand consistent with hematoma.  CT hand showed diffuse swelling without any abscess.  Hand surgery  consulted,recommended conservative management.    Hospital course remarkable for persistent pain and swelling of the right hand,finding of necrotic left 4th finger tip s/p left ring finger I & D and left dorsal hand abscess I&D. The patient was found to be positive for DVT of the right upper extremity on 02/19/2023. He was placed on apixaban for anticoagulation.  The patient is also noted to have swelling of the left upper extremity, elbow likely due to dependent edema.   PT/OT recommended SNF, TOC following for placement.     Pt's eliquis has been converted to lovenox at the suggestion of pharmacy as the patient is a Pugh-Childs Class C. The patient is being educated on administration of the lovenox.  Plan to DC home (lives with a friend) once patient demonstrates ability to do Lovenox injection and dressing changes to left hand independently.  Patient has not been able to qualify for SNF.  Assessment & Plan    Principal Problem:   Spontaneous hematoma of lower leg -Resolving, -H&H stable  Active Problems:   Alcoholic cirrhosis of liver with ascites ([place]), coagulopathy -Per patient, no longer drinking, MELD  25 on  2/15 -Continue Lasix, Aldactone    Acute deep vein thrombosis (DVT) of brachial vein of left upper extremity ([place]) - As the patient is a Childs-Pugh C, pharmacy recommends changing the patient to lovenox 1 mg/kg instead of apixaban/DOACs. -Patient reports that he still is having difficulty administering Lovenox.  Lives with a roommate who he states will not give him Lovenox due to fear of blood and needles.   -working with PT OT, recommended OOB bed to recliner, continue to work on his grip exercises for the left hand mobility. -Counseled patient that he will have to demonstrate Lovenox injection and dressing changes today independently to the RN for disposition. -Patient making progress with injecting Lovenox independently  Right/left hand hematoma, necrotic left fourth fingertip - MRI of the finger did not show any abscess or osteomyelitis, s/p I&D with cultures on 2/27 by Dr. Orene Desanctis.  -Reevaluated by Dr. Greta Doom on 3/19 (see note), no signs of infection, need to continue working on finger range of motion. Daily dressing changes. Okay to remove the bandages and get in the shower and let  soapy water run down into his hand he can clean the wound as well with some peroxide if needed.  Outpatient follow-up with Dr. Greta Doom in the office 2 weeks following discharge -Patient has completed antibiotic course -Patient reported that he was able to do the dressing yesterday with difficulty and will continue to work on the Lovenox injections and dressings today.    MDD (major depressive disorder), recurrent severe, without psychosis ([place]), suicidal ideations -Psychiatry was consulted, patient was placed on suicide precautions -Currently stable, per psychiatry no longer requires IVC, no need for inpatient psychiatry admission. -Continue Paxil, Seroquel, risperidone, Geodon as needed    Hyponatremia -In the setting of cirrhosis -Continue Lasix, Aldactone - Na improved    Tobacco abuse -Counseled  on smoking cessation, refused nicotine patch   Pressure injury of skin Stage 2 left buttock. Wound care consulted.   Estimated body mass index is 25.3 kg/m as calculated from the following:   Height as of this encounter: 5\' 9"  (1.753 m).   Weight as of this encounter: 77.7 kg.  Code Status: Full code DVT Prophylaxis:   Lovenox   Level of Care: Level of care: Med-Surg Family Communication: Updated patient Disposition Plan:      Remains inpatient appropriate: DC tomorrow as patient is making progress with injecting Lovenox and dressing changes by him self.  He states that he will have a ride home tomorrow and wants to make sure that he is sent home with supplies.   Procedures:    Consultants:   Orthopedics Psychiatry  Antimicrobials:   Anti-infectives (From admission, onward)    Start     Dose/Rate Route Frequency Ordered Stop   02/08/23 1100  Oritavancin Diphosphate (ORBACTIV) 1,200 mg in dextrose 5 % IVPB        1,200 mg 333.3 mL/hr over 180 Minutes Intravenous Once 02/08/23 0935 02/08/23 1425   02/08/23 1030  metroNIDAZOLE (FLAGYL) tablet 500 mg        500 mg Oral Every 12 hours 02/08/23 0935 02/14/23 2115   02/07/23 1745  amoxicillin-clavulanate (AUGMENTIN) 875-125 MG per tablet 1 tablet  Status:  Discontinued        1 tablet Oral Every 12 hours 02/07/23 1659 02/08/23 0935   02/06/23 2200  cefadroxil (DURICEF) capsule 500 mg  Status:  Discontinued        500 mg Oral 2 times daily 02/06/23 0939 02/06/23 0950   02/06/23 2200  cefadroxil (DURICEF) capsule 1,000 mg  Status:  Discontinued        1,000 mg Oral 2 times daily 02/06/23 0950 02/07/23 1652   02/02/23 0900  cefTRIAXone (ROCEPHIN) 2 g in sodium chloride 0.9 % 100 mL IVPB  Status:  Discontinued        2 g 200 mL/hr over 30 Minutes Intravenous Every 24 hours 02/01/23 1044 02/06/23 0938   02/01/23 1200  cefTRIAXone (ROCEPHIN) 1 g in sodium chloride 0.9 % 100 mL IVPB  Status:  Discontinued        1 g 200 mL/hr over 30  Minutes Intravenous  Once 02/01/23 1101 02/01/23 1150   01/31/23 2200  vancomycin (VANCOCIN) IVPB 1000 mg/200 mL premix  Status:  Discontinued        1,000 mg 200 mL/hr over 60 Minutes Intravenous Every 12 hours 01/31/23 0840 02/06/23 0938   01/31/23 0900  cefTRIAXone (ROCEPHIN) 1 g in sodium chloride 0.9 % 100 mL IVPB  Status:  Discontinued        1 g 200 mL/hr over  30 Minutes Intravenous Every 24 hours 01/31/23 0811 02/01/23 1044   01/31/23 0900  vancomycin (VANCOREADY) IVPB 1500 mg/300 mL        1,500 mg 150 mL/hr over 120 Minutes Intravenous  Once 01/31/23 0840 01/31/23 1336          Medications  enoxaparin (LOVENOX) injection  80 mg Subcutaneous Q000111Q   folic acid  1 mg Oral Daily   furosemide  40 mg Oral Daily   hydrocerin   Topical BID   multivitamin with minerals  1 tablet Oral Daily   PARoxetine  10 mg Oral Daily   polyethylene glycol  17 g Oral Daily   QUEtiapine  25 mg Oral QHS   sodium chloride flush  3 mL Intravenous Q12H   spironolactone  50 mg Oral Daily   thiamine  100 mg Oral Daily      Subjective:   Nathaniel Warren was seen and examined today.  No acute complaints, states that he is trying very hard to give the Lovenox injections and dressing changes independently.  No issues overnight. Objective:   Vitals:   02/28/23 1215 02/28/23 1929 03/01/23 0533 03/01/23 1155  BP: (!) 101/47 (!) 115/53 (!) 109/52 116/60  Pulse: 67 63 64 67  Resp:  17 19 16   Temp: 98.8 F (37.1 C) 99 F (37.2 C) 99.3 F (37.4 C) 98.2 F (36.8 C)  TempSrc: Oral Oral    SpO2: 97% 97% 97% 98%  Weight:      Height:        Intake/Output Summary (Last 24 hours) at 03/01/2023 1259 Last data filed at 03/01/2023 1030 Gross per 24 hour  Intake 220 ml  Output 1170 ml  Net -950 ml     Wt Readings from Last 3 Encounters:  01/23/23 77.7 kg  01/06/23 76.5 kg  12/26/22 76.7 kg    Physical Exam General: Alert and oriented x 3, NAD Cardiovascular: S1 S2 clear, RRR.  Respiratory:  CTAB, no wheezing Gastrointestinal: Soft, nontender, nondistended, NBS Ext: no pedal edema bilaterally Neuro: no new deficits Skin: Left hand dressing intact Psych: Normal affect  Data Reviewed:  I have personally reviewed following labs    CBC Lab Results  Component Value Date   WBC 9.5 02/28/2023   RBC 2.55 (L) 02/28/2023   HGB 9.6 (L) 02/28/2023   HCT 28.0 (L) 02/28/2023   MCV 109.8 (H) 02/28/2023   MCH 37.6 (H) 02/28/2023   PLT 108 (L) 02/28/2023   MCHC 34.3 02/28/2023   RDW 18.0 (H) 02/28/2023   LYMPHSABS 3.1 02/23/2023   MONOABS 1.1 (H) 02/23/2023   EOSABS 1.0 (H) 02/23/2023   BASOSABS 0.1 [id number]     Last metabolic panel Lab Results  Component Value Date   NA 135 02/28/2023   K 4.0 02/28/2023   CL 104 02/28/2023   CO2 26 02/28/2023   BUN 15 02/28/2023   CREATININE 0.84 02/28/2023   GLUCOSE 93 02/28/2023   GFRNONAA >60 02/28/2023   GFRAA >60 01/26/2018   CALCIUM 8.2 (L) 02/28/2023   PHOS 4.0 02/28/2023   PROT 5.8 (L) 02/28/2023   ALBUMIN 1.9 (L) 02/28/2023   LABGLOB 4.4 12/18/2022   AGRATIO 0.7 (L) 12/18/2022   BILITOT 4.9 (H) 02/28/2023   ALKPHOS 126 02/28/2023   AST 63 (H) 02/28/2023   ALT 21 02/28/2023   ANIONGAP 5 02/28/2023    CBG (last 3)  No results for input(s): "GLUCAP" in the last 72 hours.    Coagulation Profile: No results for  input(s): "INR", "PROTIME" in the last 168 hours.    Radiology Studies: I have personally reviewed the imaging studies  No results found.     Estill Cotta M.D. [facility] 03/01/2023, 12:59 PM  Available via Epic secure chat 7am-7pm After 7 pm, please refer to night coverage provider listed on amion.

## 2023-03-01 NOTE — Progress Notes (Signed)
Pt required assistance opening Lovenox packaging this morning as well as taking the cap off. Otherwise, completed injection himself after instruction. RN also watched as patient completed dressing change for hand and finger. Patient was able to teach back the steps in completing the change. Pt needed assistance with cutting petroleum and opening antibiotic ointment packages. Nurse observed patient getting frustrated and encouraged patient by stating it is okay if he is slow at opening packages and doing it at home. Will continue teaching and encouraging independence. Patient did say, "I know I can do this at home it will just take a while. I want to make sure I am sent home with adequate supplies."

## 2023-03-01 NOTE — Plan of Care (Signed)

## 2023-03-02 ENCOUNTER — Other Ambulatory Visit (HOSPITAL_COMMUNITY): Payer: Self-pay

## 2023-03-02 DIAGNOSIS — S301XXA Contusion of abdominal wall, initial encounter: Secondary | ICD-10-CM

## 2023-03-02 DIAGNOSIS — M7989 Other specified soft tissue disorders: Secondary | ICD-10-CM

## 2023-03-02 DIAGNOSIS — F4310 Post-traumatic stress disorder, unspecified: Secondary | ICD-10-CM

## 2023-03-02 DIAGNOSIS — F411 Generalized anxiety disorder: Secondary | ICD-10-CM

## 2023-03-02 MED ORDER — HYDROCERIN EX CREA
1.0000 | TOPICAL_CREAM | Freq: Two times a day (BID) | CUTANEOUS | 0 refills | Status: DC
  Filled 2023-03-02: qty 113, 14d supply, fill #0

## 2023-03-02 MED ORDER — PAROXETINE HCL 20 MG PO TABS
20.0000 mg | ORAL_TABLET | Freq: Every day | ORAL | 1 refills | Status: DC
  Filled 2023-03-02: qty 30, 30d supply, fill #0

## 2023-03-02 MED ORDER — ONDANSETRON 4 MG PO TBDP
4.0000 mg | ORAL_TABLET | Freq: Three times a day (TID) | ORAL | 0 refills | Status: DC | PRN
  Filled 2023-03-02: qty 20, 7d supply, fill #0

## 2023-03-02 MED ORDER — POLYETHYLENE GLYCOL 3350 17 GM/SCOOP PO POWD
17.0000 g | Freq: Every day | ORAL | 0 refills | Status: DC | PRN
  Filled 2023-03-02: qty 238, 14d supply, fill #0

## 2023-03-02 MED ORDER — SPIRONOLACTONE 50 MG PO TABS
50.0000 mg | ORAL_TABLET | Freq: Every morning | ORAL | 3 refills | Status: DC
  Filled 2023-03-02: qty 30, 30d supply, fill #0

## 2023-03-02 MED ORDER — ENOXAPARIN SODIUM 80 MG/0.8ML IJ SOSY
80.0000 mg | PREFILLED_SYRINGE | Freq: Two times a day (BID) | INTRAMUSCULAR | 0 refills | Status: DC
  Filled 2023-03-02: qty 48, 30d supply, fill #0

## 2023-03-02 MED ORDER — FUROSEMIDE 40 MG PO TABS
40.0000 mg | ORAL_TABLET | Freq: Every day | ORAL | 3 refills | Status: DC
  Filled 2023-03-02: qty 30, 30d supply, fill #0

## 2023-03-02 MED ORDER — QUETIAPINE FUMARATE 25 MG PO TABS
25.0000 mg | ORAL_TABLET | Freq: Every day | ORAL | 1 refills | Status: DC
  Filled 2023-03-02: qty 30, 30d supply, fill #0

## 2023-03-02 MED ORDER — OXYCODONE-ACETAMINOPHEN 5-325 MG PO TABS
1.0000 | ORAL_TABLET | Freq: Four times a day (QID) | ORAL | 0 refills | Status: DC | PRN
  Filled 2023-03-02: qty 28, 4d supply, fill #0

## 2023-03-02 NOTE — Progress Notes (Signed)
   [facility] Medication Assistance Card  Name: Nathaniel Warren ID [id number]  Bin: [id number]  RX Group: [id number]  Discharge Date: 03/02/23 Expiration Date:03/09/23 (must be filled within 7 days of discharge)  Dear [name]:  You have been approved to have the prescriptions written by your discharging physician filled through our [facility] (Medication Assistance Through [facility]) program. This program allows for a one-time (no refills) 34-day supply of selected medications for a low copay amount.  The copay is $0.00 per prescription. For instance, if you have one prescription, you will pay $3.00; for two prescriptions, you pay $6.00; for three prescriptions, you pay $9.00; and so on.  Only certain pharmacies are participating in this program with [facility]. You will need to select one of the pharmacies from the attached list and take your prescriptions, this letter, and your photo ID to one of the participating pharmacies.   We are excited that you are able to use the [facility] program to get your medications. These prescriptions must be filled within 7 days of hospital discharge or they will no longer be valid for the [facility] program. Should you have any problems with your prescriptions please contact your case management team member at [phone] for [name]/ [name] you, [name] [facility]

## 2023-03-02 NOTE — Progress Notes (Signed)
Pt transported to main entrance of hospital with all bedside belongings. Transferred to car safely and discharged home with daughter

## 2023-03-02 NOTE — Progress Notes (Signed)
Supplies for wound care given to patient for home wound care changes.   Dewayne Hatch, RN

## 2023-03-02 NOTE — Discharge Summary (Signed)
Physician Discharge Summary   Patient: Nathaniel Warren MRN: [id number] DOB: Jan 13, 1966  Admit date:     01/22/2023  Discharge date: 03/02/23  Discharge Physician: Estill Cotta, MD    PCP: [name], NP   Recommendations at discharge:   Continue Lovenox 80 mg every 12 hours for total 3 months  Discharge Diagnoses:  Right arm/left groin swelling consistent with hematoma   Alcoholic cirrhosis of liver with ascites (HCC) Right/left hand hematoma, necrotic left fourth fingertip status post I&D   MDD (major depressive disorder), recurrent severe, without psychosis ([place])   Hyponatremia   Tobacco abuse   Suicidal ideations   Acute deep vein thrombosis (DVT) of brachial vein of left upper extremity (HCC)   Pressure injury of skin    Hospital Course:  Patient is a 57 year old male with history of alcohol abuse/cirrhosis of liver, depression, chronic right ankle pain, suicidal attempts presented to the emergency department with complaint of left groin swelling, right arm swelling.  Found to have hematoma of the left groin as well as swelling of the right hand consistent with hematoma.  CT hand showed diffuse swelling without any abscess.  Hand surgery  consulted,recommended conservative management.     Hospital course remarkable for persistent pain and swelling of the right hand,finding of necrotic left 4th finger tip s/p left ring finger I & D and left dorsal hand abscess I&D. The patient was found to be positive for DVT of the right upper extremity on 02/19/2023. He was placed on apixaban for anticoagulation.  The patient is also noted to have swelling of the left upper extremity, elbow likely due to dependent edema.   PT/OT recommended SNF, TOC following for placement.     Pt's eliquis has been converted to lovenox at the suggestion of pharmacy as the patient is a Pugh-Childs Class C. The patient is being educated on administration of the lovenox.  Assessment and Plan:      Alcoholic cirrhosis of liver with ascites ([place]), coagulopathy -Per patient, no longer drinking, MELD  25 on 2/15 -Continue Lasix, Aldactone     Acute deep vein thrombosis (DVT) of brachial vein of left upper extremity ([place]) - As the patient is a Childs-Pugh C, pharmacy recommends changing the patient to lovenox 1 mg/kg instead of apixaban/DOACs. - Lives with a roommate who he states will not give him Lovenox due to fear of blood and needles.  Worked closely with PT OT on grip exercises for left hand mobility -Patient making progress with injecting Lovenox independently   Right/left hand hematoma, necrotic left fourth fingertip - MRI of the finger did not show any abscess or osteomyelitis, s/p I&D with cultures on 2/27 by Dr. Orene Desanctis.  -Reevaluated by Dr. Greta Doom on 3/19, no signs of infection, need to continue working on finger range of motion. Daily dressing changes. Okay to remove the bandages and get in the shower and let soapy water run down into his hand he can clean the wound as well with some peroxide if needed.  Outpatient follow-up with Dr. Greta Doom in the office 2 weeks following discharge -Patient has completed antibiotic course -Patient reported that he was able to do the dressing independently    MDD (major depressive disorder), recurrent severe, without psychosis ([name]), suicidal ideations -Psychiatry was consulted, patient was placed on suicide precautions -Currently stable, per psychiatry no longer requires IVC, no need for inpatient psychiatry admission. -Continue Paxil, Seroquel     Hyponatremia -In the setting of cirrhosis -Continue Lasix, Aldactone -  Na improved     Tobacco abuse -Counseled on smoking cessation, refused nicotine patch   Pressure injury of skin Stage 2 left buttock. Wound care consulted.   Estimated body mass index is 25.3 kg/m as calculated from the following:   Height as of this encounter: 5\' 9"  (1.753 m).   Weight as of this encounter: 77.7  kg.     Pain control - [employer] Controlled Substance Reporting System database was reviewed. and patient was instructed, not to drive, operate heavy machinery, perform activities at heights, swimming or participation in water activities or provide baby-sitting services while on Pain, Sleep and Anxiety Medications; until their outpatient Physician has advised to do so again. Also recommended to not to take more than prescribed Pain, Sleep and Anxiety Medications.  Consultants: Psychiatry, orthopedics, Procedures performed: I&D with cultures of left fourth fingertip Disposition: Home Diet recommendation:  Discharge Diet Orders (From admission, onward)     Start     Ordered   03/02/23 0000  Diet - low sodium heart healthy        03/02/23 0936            DISCHARGE MEDICATION: Allergies as of 03/02/2023   No Known Allergies      Medication List     STOP taking these medications    gabapentin 300 MG capsule Commonly known as: NEURONTIN   potassium chloride SA 20 MEQ tablet Commonly known as: KLOR-CON M   traZODone 100 MG tablet Commonly known as: DESYREL       TAKE these medications    enoxaparin 80 MG/0.8ML injection Commonly known as: LOVENOX Inject 0.8 mLs (80 mg total) into the skin every 12 (twelve) hours. For total 3 months   furosemide 40 MG tablet Commonly known as: LASIX Take 1 tablet (40 mg total) by mouth daily.   hydrocerin Crea Apply 1 Application topically 2 (two) times daily. Apply to right arm   ondansetron 4 MG disintegrating tablet Commonly known as: ZOFRAN-ODT Dissolve 1 tablet (4 mg total) by mouth every 8 (eight) hours as needed for nausea or vomiting.   oxyCODONE-acetaminophen 5-325 MG tablet Commonly known as: PERCOCET/ROXICET Take 1-2 tablets by mouth every 6 (six) hours as needed for moderate pain or severe pain.   PARoxetine 20 MG tablet Commonly known as: PAXIL Take 1 tablet (20 mg total) by mouth daily.   polyethylene  glycol powder 17 GM/SCOOP powder Commonly known as: GLYCOLAX/MIRALAX Measure 17 grams of powder (see fill line of cap) & mix in 4 oz of liquid/juice & drink by mouth daily as needed for mild or moderate constipation as directed.   QUEtiapine 25 MG tablet Commonly known as: SEROQUEL Take 1 tablet (25 mg total) by mouth at bedtime. What changed:  medication strength how much to take   spironolactone 50 MG tablet Commonly known as: Aldactone Take 1 tablet (50 mg total) by mouth in the morning.               Durable Medical Equipment  (From admission, onward)           Start     Ordered   02/23/23 1143  For home use only DME Walker rolling  Once       Comments: Needs left arm platform  Question Answer Comment  Walker: With [place] Wheels   Patient needs a walker to treat with the following condition Generalized weakness      02/23/23 1159  Discharge Care Instructions  (From admission, onward)           Start     Ordered   03/02/23 0000  Discharge wound care:       Comments: Daily dressing changes. Okay to remove the bandages and get in the shower and let soapy water run down onto  hand, you can clean the wound as well with some peroxide if needed.  Outpatient follow-up with Dr. Greta Doom in the office 2 weeks following discharge   03/02/23 0936            Follow-up Information     Social Security Adminitration. Go in 1 week(s).   Why: Go to [place] office to apply for disability insurance or apply online at [email or web address] information: [place], [place], [place] [address]        [place] Department of Health and [employer] Follow up in 1 week(s).   Why: Go to county department of health and human services to apply for food stamps and medicaid or apply online at [email or web address] Contact information: [address], [place], [place] [address] [phone]         [place]. Schedule an appointment as soon as possible for a visit.   Specialty: Internal Medicine Why: Follow up Contact information: [place] [id number] [place] [id number] [phone]        Orene Desanctis, MD. Schedule an appointment as soon as possible for a visit in 2 week(s).   Specialty: Orthopedic Surgery Why: for hospital follow-up Contact information: [address] [place] [address] [id number]         [name], NP. Schedule an appointment as soon as possible for a visit in 2 week(s).   Specialties: Pulmonary Disease, Endocrinology Why: for hospital follow-up Contact information: [place]. [place] [address] [phone]                Discharge Exam: Danley Danker Weights   01/23/23 2007  Weight: 77.7 kg   S: Able to inject the Lovenox and dressing changes on his left hand.  Feels comfortable going home today.  Per patient his daughter will pick him up  BP (!) 111/53 (BP Location: Right Leg)   Pulse 63   Temp 98.2 F (36.8 C)   Resp 19   Ht 5\' 9"  (1.753 m)   Wt 77.7 kg   SpO2 97%   BMI 25.30 kg/m   Physical Exam General: Alert and oriented x 3, NAD Cardiovascular: S1 S2 clear, RRR.  Respiratory: CTAB, no wheezing, rales or rhonchi Gastrointestinal: Soft, nontender, nondistended, NBS Ext: no pedal edema bilaterally Neuro: no new deficits Skin: Left hand dressing intact Psych: Normal affect    Condition at discharge: fair  The results of significant diagnostics from this hospitalization (including imaging, microbiology, ancillary and laboratory) are listed below for reference.   Imaging Studies: VAS [place] LOWER EXTREMITY VENOUS (DVT)  Result Date: 02/20/2023  Lower Venous DVT Study Patient Name:  CHARLSTON KLUZ  Date of Exam:   02/20/2023 Medical Rec #: [id number]        Accession #:    [id number] Date of Birth: 07/11/1966         Patient Gender: M Patient  Age:   67 years Exam Location:  [facility] Procedure:      VAS [place] LOWER EXTREMITY VENOUS (DVT) Referring Phys: Karie Kirks --------------------------------------------------------------------------------  Indications: Swelling.  Risk Factors:  DVT. Anticoagulation: Eliquis. Limitations: Poor ultrasound/tissue interface. Comparison Study: 02/19/2023 - Left upper extremtiy venous duplex                   Right:                   No evidence of thrombosis in the subclavian.                    Left:                   Findings consistent with acute deep vein thrombosis involving                   the left brachial veins. Findings consistent with acute                   superficial vein thrombosis involving the left basilic vein. Performing Technologist: Oliver Hum RVT  Examination Guidelines: A complete evaluation includes B-mode imaging, spectral Doppler, color Doppler, and power Doppler as needed of all accessible portions of each vessel. Bilateral testing is considered an integral part of a complete examination. Limited examinations for reoccurring indications may be performed as noted. The reflux portion of the exam is performed with the patient in reverse Trendelenburg.  +---------+---------------+---------+-----------+----------+--------------+ RIGHT    CompressibilityPhasicitySpontaneityPropertiesThrombus Aging +---------+---------------+---------+-----------+----------+--------------+ CFV      Full           Yes      Yes                                 +---------+---------------+---------+-----------+----------+--------------+ SFJ      Full                                                        +---------+---------------+---------+-----------+----------+--------------+ FV Prox  Full                                                        +---------+---------------+---------+-----------+----------+--------------+ FV Mid   Full                                                         +---------+---------------+---------+-----------+----------+--------------+ FV DistalFull                                                        +---------+---------------+---------+-----------+----------+--------------+ PFV      Full                                                        +---------+---------------+---------+-----------+----------+--------------+ POP  Full           Yes      Yes                                 +---------+---------------+---------+-----------+----------+--------------+ PTV      Full                                                        +---------+---------------+---------+-----------+----------+--------------+ PERO     Full                                                        +---------+---------------+---------+-----------+----------+--------------+   +----+---------------+---------+-----------+----------+--------------+ LEFTCompressibilityPhasicitySpontaneityPropertiesThrombus Aging +----+---------------+---------+-----------+----------+--------------+ CFV Full           Yes      Yes                                 +----+---------------+---------+-----------+----------+--------------+     Summary: RIGHT: - There is no evidence of deep vein thrombosis in the lower extremity.  - No cystic structure found in the popliteal fossa.  LEFT: - No evidence of common femoral vein obstruction.  *See table(s) above for measurements and observations. Electronically signed by Servando Snare MD on 02/20/2023 at 4:54:00 PM.    Final    VAS [place] UPPER EXTREMITY VENOUS DUPLEX  Result Date: 02/19/2023 UPPER VENOUS STUDY  Patient Name:  JERRALD TING  Date of Exam:   02/19/2023 Medical Rec #: [id number]        Accession #:    [id number] Date of Birth: 1966-05-10         Patient Gender: M Patient Age:   13 years Exam Location:  [facility] Procedure:      VAS [place] UPPER EXTREMITY VENOUS DUPLEX Referring Phys: AMRIT ADHIKARI  --------------------------------------------------------------------------------  Indications: Asymmetrical left arm swelling x 2 days Other Indications: Upper arm IV. Comparison Study: No prior left upper extremity studies. Prior right upper                   extremity venous study performed on 01-23-2023 was negative                   for DVT. Performing Technologist: Darlin Coco RDMS, RVT  Examination Guidelines: A complete evaluation includes B-mode imaging, spectral Doppler, color Doppler, and power Doppler as needed of all accessible portions of each vessel. Bilateral testing is considered an integral part of a complete examination. Limited examinations for reoccurring indications may be performed as noted.  Right Findings: +----------+------------+---------+-----------+----------+-------+ RIGHT     CompressiblePhasicitySpontaneousPropertiesSummary +----------+------------+---------+-----------+----------+-------+ Subclavian               Yes       Yes                      +----------+------------+---------+-----------+----------+-------+  Left Findings: +----------+------------+---------+-----------+----------+-------+ LEFT      CompressiblePhasicitySpontaneousPropertiesSummary +----------+------------+---------+-----------+----------+-------+ IJV           Full       Yes  Yes                      +----------+------------+---------+-----------+----------+-------+ Subclavian               Yes       Yes                      +----------+------------+---------+-----------+----------+-------+ Axillary      Full       Yes       Yes                      +----------+------------+---------+-----------+----------+-------+ Brachial    Partial      Yes       Yes               Acute  +----------+------------+---------+-----------+----------+-------+ Radial        Full                                           +----------+------------+---------+-----------+----------+-------+ Ulnar         Full                                          +----------+------------+---------+-----------+----------+-------+ Cephalic      Full                                          +----------+------------+---------+-----------+----------+-------+ Basilic     Partial      Yes       Yes               Acute  +----------+------------+---------+-----------+----------+-------+  Summary:  Right: No evidence of thrombosis in the subclavian.  Left: Findings consistent with acute deep vein thrombosis involving the left brachial veins. Findings consistent with acute superficial vein thrombosis involving the left basilic vein.  *See table(s) above for measurements and observations.  Diagnosing physician: Orlie Pollen Electronically signed by Orlie Pollen on 02/19/2023 at 5:37:26 PM.    Final    MR FINGERS LEFT W WO CONTRAST  Result Date: 02/03/2023 CLINICAL DATA:  Distal left ring finger infection after drill bit injury around Christmas. EXAM: MRI OF THE LEFT FINGERS WITHOUT AND WITH CONTRAST TECHNIQUE: Multiplanar, multisequence MR imaging of the left hand was performed before and after the administration of intravenous contrast. CONTRAST:  7.34mL GADAVIST GADOBUTROL 1 MMOL/ML IV SOLN COMPARISON:  Left ring finger x-rays from yesterday. FINDINGS: Bones/Joint/Cartilage No marrow signal abnormality. No fracture or dislocation. Joint spaces are preserved. No joint effusion. Ligaments Collateral ligaments are intact. Muscles and Tendons Flexor and extensor tendons are intact. No tenosynovitis. Soft tissue Dorsal distal ring finger soft tissue ulceration around the nail bed. No fluid collection. No soft tissue mass. IMPRESSION: 1. Dorsal distal ring finger soft tissue ulceration around the nail bed. No evidence of osteomyelitis or abscess. Electronically Signed   By: Titus Dubin M.D.   On: 02/03/2023 11:40   DG Finger Ring  Left  Result Date: 02/02/2023 CLINICAL DATA:  Wound check.  Abscess. EXAM: LEFT RING FINGER 2+V COMPARISON:  Left hand radiographs 09/22/2022 FINDINGS: There is overlying bandage material that limits evaluation of fine bony detail.  There is again irregularity of the distal predominantly lateral/radial soft tissues of the fourth finger indicating a chronic wound. There is moderate soft tissue swelling. No definite cortical erosion is seen. 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 No definite cortical erosion to suggest osteomyelitis. Electronically Signed   By: Yvonne Kendall M.D.   On: 02/02/2023 11:53   CT HAND RIGHT W CONTRAST  Result Date: 02/01/2023 CLINICAL DATA:  Right hand soft tissue infection, right hand pain, swelling EXAM: CT OF THE UPPER RIGHT EXTREMITY WITH CONTRAST TECHNIQUE: Multidetector CT imaging of the upper right extremity was performed according to the standard protocol following intravenous contrast administration. RADIATION DOSE REDUCTION: This exam was performed according to the departmental dose-optimization program which includes automated exposure control, adjustment of the mA and/or kV according to patient size and/or use of iterative reconstruction technique. CONTRAST:  50mL OMNIPAQUE IOHEXOL 300 MG/ML  SOLN COMPARISON:  01/23/2023 FINDINGS: Bones/Joint/Cartilage Remote healed left fifth metacarpal fracture again noted. No acute fracture or dislocation identified. No lytic or blastic bone lesion. Joint spaces are preserved. Ligaments Suboptimally assessed by CT. Muscles and Tendons Unremarkable Soft tissues There is marked dorsal soft tissue swelling again noted. And increasingly organized hyperdense fluid collection is seen within the dorsal soft tissues superficial to the extensor tendons measuring roughly 2.5 x 5.0 x 7.5 cm in greatest dimension on axial image # 75/8 and sagittal image # 45/10 in keeping with a probable  evolving subcutaneous hematoma. No subcutaneous gas identified. No retained radiopaque foreign body. IMPRESSION: 1. Marked dorsal soft tissue swelling with increasingly organized hyperdense fluid collection within the dorsal soft tissues superficial to the extensor tendons in keeping with a probable evolving subcutaneous hematoma. Note that super infection is not excluded on this examination alone. No subcutaneous gas identified. No retained radiopaque foreign body. 2. No acute fracture or dislocation. 3. Remote healed left fifth metacarpal fracture. Electronically Signed   By: Fidela Salisbury M.D.   On: 02/01/2023 23:13    Microbiology: Results for orders placed or performed during the hospital encounter of 01/22/23  Culture, blood (Routine X 2) w Reflex to ID Panel     Status: None   Collection Time: 01/31/23  8:45 AM   Specimen: BLOOD LEFT ARM  Result Value Ref Range Status   Specimen Description   Final    BLOOD LEFT ARM Performed at [facility], [place] [address], [place], [place] [address]    Special Requests   Final    BOTTLES DRAWN AEROBIC AND ANAEROBIC Blood Culture adequate volume Performed at [facility] [address], [place], [place] [address]    Culture   Final    NO GROWTH 5 DAYS Performed at [facility], [place] [address], [place], [place] [address]    Report Status 02/05/2023 FINAL  Final  Culture, blood (Routine X 2) w Reflex to ID Panel     Status: Abnormal   Collection Time: 01/31/23  8:46 AM   Specimen: BLOOD RIGHT HAND  Result Value Ref Range Status   Specimen Description   Final    BLOOD RIGHT HAND Performed at [facility], [place] [address], [place], [place] [address]    Special Requests   Final    BOTTLES DRAWN AEROBIC AND ANAEROBIC Blood Culture adequate volume Performed at [facility] [address] [place], [place] [address]    Culture  Setup Time   Final  GRAM POSITIVE COCCI IN CHAINS ANAEROBIC  BOTTLE ONLY Organism ID to follow CRITICAL RESULT CALLED TO, READ BACK BY AND VERIFIED WITH:  C/ PHARMD J. LEGGE 02/02/23 1256 A. LAFRANCE    Culture (A)  Final    STREPTOCOCCUS GROUP C Sent to [facility] for further susceptibility testing. SEE SEPARATE REPORT Performed at [facility], [place] [address], [place], [place] [address]    Report Status 02/13/2023 FINAL  Final  Blood Culture ID Panel (Reflexed)     Status: Abnormal   Collection Time: 01/31/23  8:46 AM  Result Value Ref Range Status   Enterococcus faecalis NOT DETECTED NOT DETECTED Final   Enterococcus Faecium NOT DETECTED NOT DETECTED Final   Listeria monocytogenes NOT DETECTED NOT DETECTED Final   Staphylococcus species NOT DETECTED NOT DETECTED Final   Staphylococcus aureus (BCID) NOT DETECTED NOT DETECTED Final   Staphylococcus epidermidis NOT DETECTED NOT DETECTED Final   Staphylococcus lugdunensis NOT DETECTED NOT DETECTED Final   Streptococcus species DETECTED (A) NOT DETECTED Final    Comment: Not Enterococcus species, Streptococcus agalactiae, Streptococcus pyogenes, or Streptococcus pneumoniae. CRITICAL RESULT CALLED TO, READ BACK BY AND VERIFIED WITH:  C/ PHARMD J. LEGGE 02/02/23 1256 A. LAFRANCE    Streptococcus agalactiae NOT DETECTED NOT DETECTED Final   Streptococcus pneumoniae NOT DETECTED NOT DETECTED Final   Streptococcus pyogenes NOT DETECTED NOT DETECTED Final   A.calcoaceticus-baumannii NOT DETECTED NOT DETECTED Final   Bacteroides fragilis NOT DETECTED NOT DETECTED Final   Enterobacterales NOT DETECTED NOT DETECTED Final   Enterobacter cloacae complex NOT DETECTED NOT DETECTED Final   Escherichia coli NOT DETECTED NOT DETECTED Final   Klebsiella aerogenes NOT DETECTED NOT DETECTED Final   Klebsiella oxytoca NOT DETECTED NOT DETECTED Final   Klebsiella pneumoniae NOT DETECTED NOT DETECTED Final   Proteus species NOT DETECTED NOT DETECTED Final   Salmonella species NOT DETECTED NOT DETECTED Final    Serratia marcescens NOT DETECTED NOT DETECTED Final   Haemophilus influenzae NOT DETECTED NOT DETECTED Final   Neisseria meningitidis NOT DETECTED NOT DETECTED Final   Pseudomonas aeruginosa NOT DETECTED NOT DETECTED Final   Stenotrophomonas maltophilia NOT DETECTED NOT DETECTED Final   Candida albicans NOT DETECTED NOT DETECTED Final   Candida auris NOT DETECTED NOT DETECTED Final   Candida glabrata NOT DETECTED NOT DETECTED Final   Candida krusei NOT DETECTED NOT DETECTED Final   Candida parapsilosis NOT DETECTED NOT DETECTED Final   Candida tropicalis NOT DETECTED NOT DETECTED Final   Cryptococcus neoformans/gattii NOT DETECTED NOT DETECTED Final    Comment: Performed at [facility], [address], [place], [place] [address]  Susceptibility, Aer + Anaerob     Status: Abnormal   Collection Time: 01/31/23  8:46 AM  Result Value Ref Range Status   Suscept, Aer + Anaerob Final report (A)  Corrected    Comment: (NOTE) Performed At: [facility] [address] [place], [place] [id number] Rush Farmer MD [id number] CORRECTED ON 03/03 AT [place]: PREVIOUSLY REPORTED AS Preliminary report    Source of Sample BLOOD/ STREP ANGINOSIS (GROUP C)  Final    Comment: Performed at [facility], [place] [address] [place], [place] [address]  Susceptibility Result     Status: Abnormal   Collection Time: 01/31/23  8:46 AM  Result Value Ref Range Status   Suscept Result 1 Comment (A)  Final    Comment: (NOTE) Streptococcus anginosus group Identification performed by account, not confirmed by this laboratory. Organism is too  fastidious for routine susceptibility studies. Performed At: [facility] [place], [place] [id number] Rush Farmer MD [id number]   Fungus Culture With Stain     Status: Abnormal   Collection Time: 02/03/23  6:07 PM   Specimen: PATH Other; Body Fluid  Result Value Ref Range Status   Fungus Stain Final report  Final    Fungus (Mycology) Culture Final report (A)  Final    Comment: (NOTE) Performed At: [facility] [place], [place] [id number] Rush Farmer MD [id number]    Fungal Source WOUND  Final    Comment: L RING FINGER Performed at [place] [address] [place], [place] [address]   Aerobic/Anaerobic Culture w Gram Stain (surgical/deep wound)     Status: None   Collection Time: 02/03/23  6:07 PM   Specimen: PATH Other; Body Fluid  Result Value Ref Range Status   Specimen Description   Final    WOUND L RING FINGER Performed at [place] [address] [place], [place] [address]    Special Requests   Final    NONE Performed at [facility], [place] [address] [place], [place] [address]    Gram Stain   Final    RARE WBC PRESENT, PREDOMINANTLY PMN FEW GRAM POSITIVE COCCI FEW GRAM NEGATIVE RODS RARE GRAM POSITIVE RODS    Culture   Final    ABUNDANT CORYNEBACTERIUM AMYCOLATUM Standardized susceptibility testing for this organism is not available. NO GROUP A STREP (S.PYOGENES) ISOLATED NO STAPHYLOCOCCUS AUREUS ISOLATED MODERATE PREVOTELLA MELANINOGENICA BETA LACTAMASE POSITIVE Performed at [facility], [place] [address] [place], [place] [address]    Report Status 02/07/2023 FINAL  Final  Acid Fast Smear (AFB)     Status: None   Collection Time: 02/03/23  6:07 PM   Specimen: PATH Other; Body Fluid  Result Value Ref Range Status   AFB Specimen Processing Concentration  Final   Acid Fast Smear Negative  Final    Comment: (NOTE) Performed At: [facility] [place], [place] [id number] Rush Farmer MD [id number]    Source (AFB) WOUND  Final    Comment: L RING FINGER Performed at [place] [address] [place], [place] [address]   Fungus Culture Result     Status: None   Collection Time: 02/03/23  6:07 PM  Result Value Ref Range Status    Result 1 Comment  Final    Comment: (NOTE) KOH/Calcofluor preparation:  no fungus observed. Performed At: [facility] [place], [place] [id number] Rush Farmer MD [id number]   Fungal organism reflex     Status: Abnormal   Collection Time: 02/03/23  6:07 PM  Result Value Ref Range Status   Fungal result 1 Trichophyton rubrum (A)  Final    Comment: (NOTE) Performed At: [facility] [place], [place] [id number] Rush Farmer MD [id number]   Fungus Culture With Stain     Status: None (Preliminary result)   Collection Time: 02/03/23  6:25 PM   Specimen: PATH Other; Body Fluid  Result Value Ref Range Status   Fungus Stain Final report  Final    Comment: (NOTE) Performed At: [facility] [place], [place] [id number] Rush Farmer MD [id number]    Fungus (Mycology) Culture PENDING  Incomplete   Fungal Source WOUND  Final    Comment: LH Performed at [place] [address]  [place], [place] [address]   Aerobic/Anaerobic Culture w Gram Stain (surgical/deep wound)     Status: None   Collection Time: 02/03/23  6:25 PM   Specimen: PATH Other; Body Fluid  Result Value Ref Range Status   Specimen Description   Final    WOUND LH Performed at [place] [address] [place], [place] [address]    Special Requests   Final    NONE Performed at [facility], [place] [address], [place], [place] [address]    Gram Stain   Final    RARE WBC PRESENT, PREDOMINANTLY PMN NO ORGANISMS SEEN    Culture   Final    No growth aerobically or anaerobically. Performed at [facility], [place] [address], [place], [place] [address]    Report Status 02/09/2023 FINAL  Final  Acid Fast Smear (AFB)     Status: None   Collection Time: 02/03/23  6:25 PM   Specimen: PATH Other; Body Fluid  Result Value Ref Range Status   AFB Specimen Processing Concentration   Final   Acid Fast Smear Negative  Final    Comment: (NOTE) Performed At: [facility] [place], [place] [id number] Rush Farmer MD [id number]    Source (AFB) WOUND  Final    Comment: [facility] Performed at [place] [address] [place], [place] [address]   Fungus Culture Result     Status: None   Collection Time: 02/03/23  6:25 PM  Result Value Ref Range Status   Result 1 Comment  Final    Comment: (NOTE) KOH/Calcofluor preparation:  no fungus observed. Performed At: [facility] [place], [place] [id number] Rush Farmer MD [id number]     Labs: CBC: Recent Labs  Lab 02/24/23 0422 02/25/23 0352 02/26/23 0422 02/27/23 0438 02/28/23 0405  WBC 9.4 8.9 7.7 9.7 9.5  HGB 9.3* 9.4* 9.1* 9.3* 9.6*  HCT 27.4* 27.5* 27.4* 28.0* 28.0*  MCV 108.7* 110.4* 110.9* 112.0* 109.8*  PLT 97* 106* 100* 105* 123XX123*   Basic Metabolic Panel: Recent Labs  Lab 02/24/23 0422 02/25/23 0352 02/26/23 0422 02/27/23 0438 02/28/23 0405  NA 135 135 135 135 135  K 3.9 4.3 3.8 3.9 4.0  CL 102 104 103 104 104  CO2 25 25 27 25 26   GLUCOSE 94 90 102* 92 93  BUN 17 15 18 14 15   CREATININE 0.90 0.84 0.94 0.85 0.84  CALCIUM 8.2* 8.3* 8.3* 8.2* 8.2*  MG 1.7 1.7 1.8 1.6* 1.8  PHOS 4.3 4.1 4.3 4.0 4.0   Liver Function Tests: Recent Labs  Lab 02/24/23 0422 02/25/23 0352 02/26/23 0422 02/27/23 0438 02/28/23 0405  AST 63* 63* 65* 65* 63*  ALT 20 21 22 21 21   ALKPHOS 150* 135* 148* 139* 126  BILITOT 5.5* 5.4* 5.6* 5.1* 4.9*  PROT 5.9* 6.0* 6.0* 5.8* 5.8*  ALBUMIN 2.1* 2.1* 2.1* 2.0* 1.9*   CBG: No results for input(s): "GLUCAP" in the last 168 hours.  Discharge time spent: greater than 30 minutes.  Signed: Estill Cotta, MD [facility] 03/02/2023

## 2023-03-02 NOTE — Plan of Care (Signed)
  Problem: Health Behavior/Discharge Planning: Goal: Ability to manage health-related needs will improve Outcome: Adequate for Discharge   Problem: Clinical Measurements: Goal: Ability to maintain clinical measurements within normal limits will improve Outcome: Adequate for Discharge Goal: Will remain free from infection Outcome: Adequate for Discharge Goal: Diagnostic test results will improve Outcome: Adequate for Discharge Goal: Respiratory complications will improve Outcome: Adequate for Discharge Goal: Cardiovascular complication will be avoided Outcome: Adequate for Discharge   Problem: Activity: Goal: Risk for activity intolerance will decrease Outcome: Adequate for Discharge   Problem: Nutrition: Goal: Adequate nutrition will be maintained Outcome: Adequate for Discharge   Problem: Coping: Goal: Level of anxiety will decrease Outcome: Adequate for Discharge   Problem: Elimination: Goal: Will not experience complications related to bowel motility Outcome: Adequate for Discharge   Problem: Pain Managment: Goal: General experience of comfort will improve Outcome: Adequate for Discharge   Problem: Safety: Goal: Ability to remain free from injury will improve Outcome: Adequate for Discharge   Problem: Skin Integrity: Goal: Risk for impaired skin integrity will decrease Outcome: Adequate for Discharge   

## 2023-03-02 NOTE — TOC Progression Note (Signed)
Transition of Care [facility]) - Progression Note    Patient Details  Name: Nathaniel Warren MRN: [id number] Date of Birth: Oct 27, 1966  Transition of Care [facility]) CM/SW [place], RN Phone Number:[phone]  03/02/2023, 10:00 AM  Clinical Narrative:    MATCH completed to provide uninsured patient with 30 days supply of medications.   Expected Discharge Plan: [facility] Barriers to Discharge: Continued Medical Work up  Expected Discharge Plan and Services In-house Referral: Clinical Social Work Discharge Planning Services: CM Consult Post Acute Care Choice: Durable Medical Equipment Living arrangements for the past 2 months: Single Family Home Expected Discharge Date: 03/02/23               DME Arranged: Gilford Rile platform, Walker rolling DME Agency: [employer] Date DME Agency Contacted: 02/23/23 Time DME Agency Contacted: [id number] Representative spoke with at DME Agency: Erasmo Downer HH Arranged: NA           Social Determinants of Health ([place]) Interventions [place]: Food Insecurity Present (01/23/2023)  Housing: Medium Risk (01/23/2023)  Transportation Needs: Unmet Transportation Needs (01/23/2023)  Utilities: Not At Risk (01/23/2023)  Alcohol Screen: Medium Risk (10/27/2017)  Depression (PHQ2-9): High Risk (12/18/2022)  Financial Resource Strain: High Risk (07/15/2022)  Physical Activity: Inactive (07/15/2022)  Social Connections: Socially Isolated (07/15/2022)  Stress: Stress Concern Present (07/15/2022)  Tobacco Use: High Risk (02/04/2023)    Readmission Risk Interventions     No data to display

## 2023-03-02 NOTE — Progress Notes (Signed)
Physical Therapy Treatment Patient Details Name: Nathaniel Warren MRN: [id number] DOB: 10-Feb-1966 Today's Date: 03/02/2023   History of Present Illness 57 yo male with onset of groin and RT dorsal hand bruising and edema with reported light trauma during a fall.  I&D to LT D4 on 02/03/23. Has h/o R foot drop without AFO. + for suicidal ideations.   PMHx:  etoh abuse, depression with h/o 4 suicide attempts, stroke, seizures, liver failure, R ankle fracture with ORIF, atherosclerosis, cirrhosis, diverticulosis, osteopenia.    PT Comments     Pt admitted with above diagnosis.  Pt currently with functional limitations due to the deficits listed below (see PT Problem List). Pt reports and nursing confirms pt is to d/c home today with son. Pt is currently mod I with bed mobility, S and increased time for transfer and gait tasks at L PFRW level and min A for step navigation with cues. Pt will benefit from acute skilled PT to increase their independence and safety with mobility to allow discharge.     Recommendations for follow up therapy are one component of a multi-disciplinary discharge planning process, led by the attending physician.  Recommendations may be updated based on patient status, additional functional criteria and insurance authorization.  Follow Up Recommendations  Can patient physically be transported by private vehicle: Yes    Assistance Recommended at Discharge Intermittent Supervision/Assistance  Patient can return home with the following A little help with walking and/or transfers;Direct supervision/assist for medications management;Assistance with cooking/housework;Assist for transportation;A little help with bathing/dressing/bathroom;Help with stairs or ramp for entrance   Equipment Recommendations  Rolling walker (2 wheels);Other (comment) (LEFT platform)    Recommendations for Other Services       Precautions / Restrictions Precautions Precautions: Fall Precaution  Comments: R foot drop, does better with his shoes on (less ankle roll) Required Braces or Orthoses:  (bandage) Splint/Cast: LT ring finger - kerlex and coban wrapping Splint/Cast - Date Prophylactic Dressing Applied (if applicable):  (Applied by MD) Restrictions Weight Bearing Restrictions: No Other Position/Activity Restrictions: patient limits LT hand WB since I&D to D4 secondary to pain report     Mobility  Bed Mobility Overal bed mobility: Modified Independent             General bed mobility comments: HOB elevated and use of R bed rail    Transfers Overall transfer level: Needs assistance Equipment used: Left platform walker Transfers: Sit to/from Stand             General transfer comment: pt has been electing to self transfer and L PFRW level to and from bathroom, pt requires increased time and noted deficits with power up and continues to place L forearm on RW to assist to stand    Ambulation/Gait Ambulation/Gait assistance: Supervision Gait Distance (Feet): 100 Feet Assistive device: Left platform walker, Rolling walker (2 wheels) (R handle of RW built up due to pt reports of pain with R UE WB though hand) Gait Pattern/deviations: Step-through pattern, Steppage, Narrow base of support Gait velocity: decreased     General Gait Details: applied B shoes, R lateral ankle translation due to instability and weakness associated with fall in 08/2022. pt compensates with steppage pattern on R to clear R foot   Stairs Stairs: Yes Stairs assistance: Min assist Stair Management: One rail Right (and HHA on L side) Number of Stairs: 4 General stair comments: cues for safety, sequencing, and encouragement   Wheelchair Mobility    Modified Rankin (Stroke Patients  Only)       Balance Overall balance assessment: Needs assistance Sitting-balance support: No upper extremity supported, Feet supported Sitting balance-Leahy Scale: Good     Standing balance support:  No upper extremity supported (static standing and UE support at L PFRW with dynamic and gait tasks) Standing balance-Leahy Scale: Fair                              Cognition Arousal/Alertness: Awake/alert Behavior During Therapy: WFL for tasks assessed/performed Overall Cognitive Status: Within Functional Limits for tasks assessed                                          Exercises      General Comments        Pertinent Vitals/Pain Pain Assessment Pain Assessment: 0-10 Pain Score: 6  Pain Location: L hand Pain Descriptors / Indicators: Tender, Throbbing, Tightness, Sharp, Shooting, Jabbing Pain Intervention(s): Limited activity within patient's tolerance, Monitored during session, Premedicated before session    Home Living Family/patient expects to be discharged to:: Private residence Living Arrangements: Other relatives (Cousin) Available Help at Discharge: Family;Available PRN/intermittently Type of Home: House Home Access: Stairs to enter Entrance Stairs-Rails: Left Entrance Stairs-Number of Steps: 2   Home Layout: One level Home Equipment: Conservation officer, nature (2 wheels);Crutches;Wheelchair - manual;Grab bars - tub/shower;Grab bars - toilet Additional Comments: declined wheelchair as he is unable to maneuver in his home    Prior Function            PT Goals (current goals can now be found in the care plan section) Acute Rehab PT Goals Patient Stated Goal:  (pt is motivated to d/c home) PT Goal Formulation: With patient Time For Goal Achievement: 02/20/23 Potential to Achieve Goals: Good    Frequency    Min 3X/week      PT Plan      Co-evaluation   Reason for Co-Treatment: For patient/therapist safety;To address functional/ADL transfers PT goals addressed during session: Mobility/safety with mobility OT goals addressed during session: ADL's and self-care      AM-PAC PT "6 Clicks" Mobility   Outcome Measure  Help needed  turning from your back to your side while in a flat bed without using bedrails?: None Help needed moving from lying on your back to sitting on the side of a flat bed without using bedrails?: None Help needed moving to and from a bed to a chair (including a wheelchair)?: A Little Help needed standing up from a chair using your arms (e.g., wheelchair or bedside chair)?: A Little Help needed to walk in hospital room?: A Little Help needed climbing 3-5 steps with a railing? : A Lot 6 Click Score: 19    End of Session Equipment Utilized During Treatment: Gait belt Activity Tolerance: Patient tolerated treatment well Patient left: in bed;with call bell/phone within reach Nurse Communication: Mobility status;Other (comment) (progression toward d/c and completing step navigation) PT Visit Diagnosis: Muscle weakness (generalized) (M62.81);Other abnormalities of gait and mobility (R26.89);History of falling (Z91.81)     Time: [id number] PT Time Calculation (min) (ACUTE ONLY): 33 min  Charges:  $Gait Training: 8-22 mins $Therapeutic Activity: 8-22 mins                     Baird Lyons, PT   Adair Patter 03/02/2023, 11:07 AM

## 2023-03-02 NOTE — Progress Notes (Signed)
Occupational Therapy Treatment Patient Details Name: Nathaniel Warren MRN: [id number] DOB: 21-Dec-1965 Today's Date: 03/02/2023   History of present illness 57 yo male with onset of groin and RT dorsal hand bruising and edema with reported light trauma during a fall.  I&D to LT D4 on 02/03/23. Has h/o R foot drop without AFO. + for suicidal ideations.   PMHx:  etoh abuse, depression with h/o 4 suicide attempts, stroke, seizures, liver failure, R ankle fracture with ORIF, atherosclerosis, cirrhosis, diverticulosis, osteopenia.   OT comments  Patient progressing and showed improved overall ADL improvement as evidenced by an increased score on 6-Clicks AM-PAC measure of occupational Performance with previous score of 18/24, and current score of 21/24.  Related to progress is pt's increased functional use of RT hand while LT hand continues to progress with measurement gains recorded below.  Pt expressed feeling positive about his progress but recognizes that he has a ways to go, esp with his LT hand. Pt could absolutely benefit from continued therapy for his hands, ideally in outpatient but in the home could also help, however pt reports limited options for aftercare due to insurance.    Patient remains limited by LT hand pain, RT hand numbness/tingling, generalized weakness and decreased activity tolerance along with deficits noted below. Pt continues to demonstrate good rehab potential and would benefit from continued skilled OT to increase Bilateral hand ROM/strength and function to assist pt with ADLs and functional transfers to allow pt to avoid rehospitalization and reduce caregiver burden and fall risk.    Recommendations for follow up therapy are one component of a multi-disciplinary discharge planning process, led by the attending physician.  Recommendations may be updated based on patient status, additional functional criteria and insurance authorization.    Assistance Recommended at Discharge  Intermittent Supervision/Assistance  Patient can return home with the following  A little help with bathing/dressing/bathroom;A little help with walking and/or transfers;Assistance with cooking/housework;Assist for transportation;Direct supervision/assist for medications management   Equipment Recommendations  Tub/shower seat    Recommendations for Other Services      Precautions / Restrictions Precautions Precautions: Fall Precaution Comments: R foot drop, does better with his shoes on (less ankle roll) Splint/Cast: LT ring finger - kerlex and coban wrapping Restrictions Weight Bearing Restrictions: No Other Position/Activity Restrictions: patient limits LT hand WB since I&D to D4 secondary to pain report       Mobility Bed Mobility               General bed mobility comments: Pt declined OOB for breakfast.    Transfers                   General transfer comment: Per RN in room, pt has been getting self OOB and walking to bathroom Mod I with platform walker. Pt now with platform to attach to his RW at home.     Balance                                           ADL either performed or assessed with clinical judgement   ADL Overall ADL's : Needs assistance/impaired (Worked on LT hand rehab to address need for pt to gain LT hand opposition and strength in order to give himself needed lovenox shots as an outpatient.)  Extremity/Trunk Assessment Upper Extremity Assessment RUE Deficits / Details: Overall improvement but pt now reporting decreased sensation. Suspect nerve involvement from prolonged edema and nerve compression in this area. See Exercise section. RUE Sensation: decreased light touch RUE Coordination: WNL LUE Deficits / Details: Continues: On 02/13/23: Edema: LT D2: 7.5 cm at PIP. D3: 7.6cm at PIP.  D3 to [facility]: 5.0 cm decreased distance from 5.5cm last session.   3/19-Unfortunately, pt has lost progress with D3 to [facility]. Pt is just able to oppose D1 to D2 with increased time/effort/concentration, but not strength behind it. Did not measure D3 to [facility] due to pain but eyeballed looks close to 6-7cm.   On 3/25: D3 PIP measurement of 5.9cm. D3 to [facility] of 4.8.  Very good progress since last session. LUE Sensation: decreased light touch LUE Coordination: decreased fine motor            Vision       Perception     Praxis      Cognition Arousal/Alertness: Awake/alert Behavior During Therapy: WFL for tasks assessed/performed Overall Cognitive Status: Within Functional Limits for tasks assessed                                 General Comments: Very pleasant, motivated        Exercises Other Exercises Other Exercises: continued L UE forearm supination and pronation, wrist flex/ext, UD/RD, digit flex.ext and horizonal abd/add xat least 5 reps each for pt to demonstrate understanding for carry over after discharge. Other Exercises: Prior to exercise: Manual traction to each LT finger (except D4) follwoed by pt performing self-passive ROM to digits on LT Other Exercises: Handouts provided for RT hand median and radial nerve gliding exercises and OT demonstrated to pt as well as pt demonstrating median nerve glides back to OT.    Shoulder Instructions       General Comments      Pertinent Vitals/ Pain       Pain Assessment Pain Assessment: 0-10 Pain Score: 7  Faces Pain Scale: Hurts whole lot Pain Location: L hand during exercises and measurements Pain Descriptors / Indicators: Tender, Throbbing, Tightness, Sharp, Shooting, Jabbing Pain Intervention(s): Monitored during session, Relaxation, Repositioned, Limited activity within patient's tolerance  Home Living                                          Prior Functioning/Environment              Frequency  Min 3X/week        Progress Toward Goals  OT  Goals(current goals can now be found in the care plan section)  Progress towards OT goals: Progressing toward goals  Acute Rehab OT Goals OT Goal Formulation: With patient Time For Goal Achievement: 03/10/23 Potential to Achieve Goals: Good  Plan Discharge plan remains appropriate    Co-evaluation                 AM-PAC OT "6 Clicks" Daily Activity     Outcome Measure   Help from another person eating meals?: None Help from another person taking care of personal grooming?: None Help from another person toileting, which includes using toliet, bedpan, or urinal?: None Help from another person bathing (including washing, rinsing, drying)?: A Little Help from another person to put on and taking  off regular upper body clothing?: A Little Help from another person to put on and taking off regular lower body clothing?: A Little 6 Click Score: 21    End of Session    OT Visit Diagnosis: Pain;History of falling (Z91.81);Repeated falls (R29.6);Muscle weakness (generalized) (M62.81);Unsteadiness on feet (R26.81) Pain - Right/Left: Left Pain - part of body: Hand;Arm   Activity Tolerance Patient limited by pain   Patient Left in bed;with call bell/phone within reach;with nursing/sitter in room   Nurse Communication Other (comment) (Progress with hand. RN informed OT that pt scheduled to d/c today)        Time: [id number] OT Time Calculation (min): 28 min  Charges: OT Treatments $Neuromuscular Re-education: 8-22 mins $Therapeutic Exercise: 8-22 mins  Anderson [place], [place] Office: [phone] 03/02/2023  Julien Girt 03/02/2023, 9:58 AM

## 2023-03-03 ENCOUNTER — Telehealth: Payer: Self-pay

## 2023-03-03 NOTE — Transitions of Care (Post Inpatient/ED Visit) (Unsigned)
   03/03/2023  Name: Nathaniel Warren MRN: [id number] DOB: [birth year]  Today's TOC FU Call Status: Today's TOC FU Call Status:: Unsuccessul Call (1st Attempt) Unsuccessful Call (1st Attempt) Date: 03/03/23  Attempted to reach the patient regarding the most recent Inpatient/ED visit.  Follow Up Plan: Additional outreach attempts will be made to reach the patient to complete the Transitions of Care (Post Inpatient/ED visit) call.   [place] LPN [place] Advisor Direct Dial [phone]

## 2023-03-04 ENCOUNTER — Inpatient Hospital Stay (HOSPITAL_COMMUNITY)
Admission: EM | Admit: 2023-03-04 | Discharge: 2023-03-09 | DRG: 813 | Disposition: A | Discharge status: Home / Self Care | Payer: Medicaid Other | Attending: Family Medicine | Admitting: Family Medicine

## 2023-03-04 ENCOUNTER — Encounter (HOSPITAL_COMMUNITY): Payer: Self-pay

## 2023-03-04 ENCOUNTER — Other Ambulatory Visit: Payer: Self-pay

## 2023-03-04 ENCOUNTER — Emergency Department (HOSPITAL_COMMUNITY): Payer: Medicaid Other

## 2023-03-04 DIAGNOSIS — Z800 Family history of malignant neoplasm of digestive organs: Secondary | ICD-10-CM

## 2023-03-04 DIAGNOSIS — D6959 Other secondary thrombocytopenia: Secondary | ICD-10-CM | POA: Diagnosis present

## 2023-03-04 DIAGNOSIS — L02512 Cutaneous abscess of left hand: Secondary | ICD-10-CM | POA: Diagnosis present

## 2023-03-04 DIAGNOSIS — K7040 Alcoholic hepatic failure without coma: Secondary | ICD-10-CM | POA: Diagnosis present

## 2023-03-04 DIAGNOSIS — F419 Anxiety disorder, unspecified: Secondary | ICD-10-CM | POA: Diagnosis present

## 2023-03-04 DIAGNOSIS — L03114 Cellulitis of left upper limb: Secondary | ICD-10-CM | POA: Diagnosis present

## 2023-03-04 DIAGNOSIS — D684 Acquired coagulation factor deficiency: Principal | ICD-10-CM | POA: Diagnosis present

## 2023-03-04 DIAGNOSIS — F4310 Post-traumatic stress disorder, unspecified: Secondary | ICD-10-CM | POA: Diagnosis present

## 2023-03-04 DIAGNOSIS — F332 Major depressive disorder, recurrent severe without psychotic features: Secondary | ICD-10-CM | POA: Diagnosis present

## 2023-03-04 DIAGNOSIS — D649 Anemia, unspecified: Secondary | ICD-10-CM | POA: Diagnosis present

## 2023-03-04 DIAGNOSIS — R042 Hemoptysis: Secondary | ICD-10-CM | POA: Diagnosis not present

## 2023-03-04 DIAGNOSIS — I82622 Acute embolism and thrombosis of deep veins of left upper extremity: Secondary | ICD-10-CM | POA: Diagnosis present

## 2023-03-04 DIAGNOSIS — K7460 Unspecified cirrhosis of liver: Secondary | ICD-10-CM

## 2023-03-04 DIAGNOSIS — F411 Generalized anxiety disorder: Secondary | ICD-10-CM | POA: Diagnosis present

## 2023-03-04 DIAGNOSIS — Z9151 Personal history of suicidal behavior: Secondary | ICD-10-CM

## 2023-03-04 DIAGNOSIS — F1010 Alcohol abuse, uncomplicated: Secondary | ICD-10-CM | POA: Diagnosis present

## 2023-03-04 DIAGNOSIS — G8929 Other chronic pain: Secondary | ICD-10-CM | POA: Diagnosis present

## 2023-03-04 DIAGNOSIS — I82409 Acute embolism and thrombosis of unspecified deep veins of unspecified lower extremity: Secondary | ICD-10-CM | POA: Diagnosis present

## 2023-03-04 DIAGNOSIS — L98499 Non-pressure chronic ulcer of skin of other sites with unspecified severity: Secondary | ICD-10-CM | POA: Diagnosis present

## 2023-03-04 DIAGNOSIS — M25571 Pain in right ankle and joints of right foot: Secondary | ICD-10-CM | POA: Diagnosis present

## 2023-03-04 DIAGNOSIS — Z825 Family history of asthma and other chronic lower respiratory diseases: Secondary | ICD-10-CM

## 2023-03-04 DIAGNOSIS — R45851 Suicidal ideations: Secondary | ICD-10-CM | POA: Diagnosis present

## 2023-03-04 DIAGNOSIS — F17210 Nicotine dependence, cigarettes, uncomplicated: Secondary | ICD-10-CM | POA: Diagnosis present

## 2023-03-04 DIAGNOSIS — R188 Other ascites: Secondary | ICD-10-CM

## 2023-03-04 DIAGNOSIS — Z8673 Personal history of transient ischemic attack (TIA), and cerebral infarction without residual deficits: Secondary | ICD-10-CM

## 2023-03-04 DIAGNOSIS — R601 Generalized edema: Secondary | ICD-10-CM

## 2023-03-04 DIAGNOSIS — Z820 Family history of epilepsy and other diseases of the nervous system: Secondary | ICD-10-CM

## 2023-03-04 DIAGNOSIS — J90 Pleural effusion, not elsewhere classified: Secondary | ICD-10-CM

## 2023-03-04 DIAGNOSIS — Z91199 Patient's noncompliance with other medical treatment and regimen due to unspecified reason: Secondary | ICD-10-CM

## 2023-03-04 DIAGNOSIS — K068 Other specified disorders of gingiva and edentulous alveolar ridge: Secondary | ICD-10-CM | POA: Diagnosis present

## 2023-03-04 DIAGNOSIS — Z79899 Other long term (current) drug therapy: Secondary | ICD-10-CM

## 2023-03-04 DIAGNOSIS — Z823 Family history of stroke: Secondary | ICD-10-CM

## 2023-03-04 DIAGNOSIS — I81 Portal vein thrombosis: Secondary | ICD-10-CM | POA: Diagnosis present

## 2023-03-04 DIAGNOSIS — Z9081 Acquired absence of spleen: Secondary | ICD-10-CM

## 2023-03-04 DIAGNOSIS — E876 Hypokalemia: Secondary | ICD-10-CM | POA: Diagnosis present

## 2023-03-04 DIAGNOSIS — K766 Portal hypertension: Secondary | ICD-10-CM | POA: Diagnosis present

## 2023-03-04 DIAGNOSIS — K7030 Alcoholic cirrhosis of liver without ascites: Secondary | ICD-10-CM | POA: Diagnosis present

## 2023-03-04 DIAGNOSIS — Z8249 Family history of ischemic heart disease and other diseases of the circulatory system: Secondary | ICD-10-CM

## 2023-03-04 DIAGNOSIS — Z86718 Personal history of other venous thrombosis and embolism: Secondary | ICD-10-CM

## 2023-03-04 DIAGNOSIS — K7031 Alcoholic cirrhosis of liver with ascites: Secondary | ICD-10-CM | POA: Diagnosis present

## 2023-03-04 DIAGNOSIS — Z833 Family history of diabetes mellitus: Secondary | ICD-10-CM

## 2023-03-04 HISTORY — DX: Unspecified cirrhosis of liver: K74.60

## 2023-03-04 HISTORY — DX: Tobacco use: Z72.0

## 2023-03-04 LAB — CBC WITH DIFFERENTIAL/PLATELET
Abs Immature Granulocytes: 0.03 10*3/uL (ref 0.00–0.07)
Basophils Absolute: 0.1 10*3/uL (ref 0.0–0.1)
Basophils Relative: 1 %
Eosinophils Absolute: 0.8 10*3/uL — ABNORMAL HIGH (ref 0.0–0.5)
Eosinophils Relative: 8 %
HCT: 31 % — ABNORMAL LOW (ref 39.0–52.0)
Hemoglobin: 10.5 g/dL — ABNORMAL LOW (ref 13.0–17.0)
Immature Granulocytes: 0 %
Lymphocytes Relative: 35 %
Lymphs Abs: 3.2 10*3/uL (ref 0.7–4.0)
MCH: 37.2 pg — ABNORMAL HIGH (ref 26.0–34.0)
MCHC: 33.9 g/dL (ref 30.0–36.0)
MCV: 109.9 fL — ABNORMAL HIGH (ref 80.0–100.0)
Monocytes Absolute: 1.3 10*3/uL — ABNORMAL HIGH (ref 0.1–1.0)
Monocytes Relative: 15 %
Neutro Abs: 3.8 10*3/uL (ref 1.7–7.7)
Neutrophils Relative %: 41 %
Platelets: 122 10*3/uL — ABNORMAL LOW (ref 150–400)
RBC: 2.82 MIL/uL — ABNORMAL LOW (ref 4.22–5.81)
RDW: 17.4 % — ABNORMAL HIGH (ref 11.5–15.5)
WBC: 9.2 10*3/uL (ref 4.0–10.5)
nRBC: 0 % (ref 0.0–0.2)

## 2023-03-04 LAB — COMPREHENSIVE METABOLIC PANEL
ALT: 23 U/L (ref 0–44)
AST: 67 U/L — ABNORMAL HIGH (ref 15–41)
Albumin: 2.5 g/dL — ABNORMAL LOW (ref 3.5–5.0)
Alkaline Phosphatase: 159 U/L — ABNORMAL HIGH (ref 38–126)
Anion gap: 8 (ref 5–15)
BUN: 10 mg/dL (ref 6–20)
CO2: 25 mmol/L (ref 22–32)
Calcium: 8.7 mg/dL — ABNORMAL LOW (ref 8.9–10.3)
Chloride: 102 mmol/L (ref 98–111)
Creatinine, Ser: 0.77 mg/dL (ref 0.61–1.24)
GFR, Estimated: >60 mL/min (ref >60)
Glucose, Bld: 109 mg/dL — ABNORMAL HIGH (ref 70–99)
Potassium: 3.5 mmol/L (ref 3.5–5.1)
Sodium: 135 mmol/L (ref 135–145)
Total Bilirubin: 5.9 mg/dL — ABNORMAL HIGH (ref 0.3–1.2)
Total Protein: 6.8 g/dL (ref 6.5–8.1)

## 2023-03-04 LAB — CBC
HCT: 28 % — ABNORMAL LOW (ref 39.0–52.0)
Hemoglobin: 9.6 g/dL — ABNORMAL LOW (ref 13.0–17.0)
MCH: 37.5 pg — ABNORMAL HIGH (ref 26.0–34.0)
MCHC: 34.3 g/dL (ref 30.0–36.0)
MCV: 109.4 fL — ABNORMAL HIGH (ref 80.0–100.0)
Platelets: 117 10*3/uL — ABNORMAL LOW (ref 150–400)
RBC: 2.56 MIL/uL — ABNORMAL LOW (ref 4.22–5.81)
RDW: 17.5 % — ABNORMAL HIGH (ref 11.5–15.5)
WBC: 7.8 10*3/uL (ref 4.0–10.5)
nRBC: 0 % (ref 0.0–0.2)

## 2023-03-04 LAB — PROTIME-INR
INR: 1.8 — ABNORMAL HIGH (ref 0.8–1.2)
Prothrombin Time: 20.4 seconds — ABNORMAL HIGH (ref 11.4–15.2)

## 2023-03-04 LAB — STREP PNEUMONIAE URINARY ANTIGEN: Strep Pneumo Urinary Antigen: NEGATIVE

## 2023-03-04 LAB — TYPE AND SCREEN
ABO/RH(D): O POS
Antibody Screen: NEGATIVE

## 2023-03-04 MED ORDER — QUETIAPINE FUMARATE 25 MG PO TABS
25.0000 mg | ORAL_TABLET | Freq: Every day | ORAL | Status: DC
  Administered 2023-03-04 – 2023-03-08 (×5): 25 mg via ORAL
  Filled 2023-03-04 (×5): qty 1

## 2023-03-04 MED ORDER — SODIUM CHLORIDE (PF) 0.9 % IJ SOLN
INTRAMUSCULAR | Status: AC
  Filled 2023-03-04: qty 50

## 2023-03-04 MED ORDER — ONDANSETRON HCL 4 MG/2ML IJ SOLN: 4.0000 mg | Freq: Four times a day (QID) | INTRAMUSCULAR | Status: DC | PRN

## 2023-03-04 MED ORDER — DOCUSATE SODIUM 100 MG PO CAPS
100.0000 mg | ORAL_CAPSULE | Freq: Two times a day (BID) | ORAL | Status: DC
  Administered 2023-03-04 – 2023-03-08 (×3): 100 mg via ORAL
  Filled 2023-03-04 (×9): qty 1

## 2023-03-04 MED ORDER — ALBUTEROL SULFATE (2.5 MG/3ML) 0.083% IN NEBU: 2.5000 mg | INHALATION_SOLUTION | RESPIRATORY_TRACT | Status: DC | PRN

## 2023-03-04 MED ORDER — POLYETHYLENE GLYCOL 3350 17 G PO PACK: 17.0000 g | PACK | Freq: Every day | ORAL | Status: DC | PRN

## 2023-03-04 MED ORDER — PAROXETINE HCL 20 MG PO TABS
20.0000 mg | ORAL_TABLET | Freq: Every day | ORAL | Status: DC
  Administered 2023-03-04 – 2023-03-09 (×6): 20 mg via ORAL
  Filled 2023-03-04 (×6): qty 1

## 2023-03-04 MED ORDER — ONDANSETRON HCL 4 MG PO TABS
4.0000 mg | ORAL_TABLET | Freq: Four times a day (QID) | ORAL | Status: DC | PRN
  Administered 2023-03-04: 4 mg via ORAL
  Filled 2023-03-04: qty 1

## 2023-03-04 MED ORDER — IOHEXOL 350 MG/ML SOLN
75.0000 mL | Freq: Once | INTRAVENOUS | Status: AC | PRN
  Administered 2023-03-04: 75 mL via INTRAVENOUS

## 2023-03-04 MED ORDER — SPIRONOLACTONE 25 MG PO TABS
50.0000 mg | ORAL_TABLET | Freq: Every morning | ORAL | Status: DC
  Administered 2023-03-04 – 2023-03-06 (×3): 50 mg via ORAL
  Filled 2023-03-04 (×4): qty 2

## 2023-03-04 MED ORDER — TRAMADOL HCL 50 MG PO TABS
50.0000 mg | ORAL_TABLET | Freq: Three times a day (TID) | ORAL | Status: DC | PRN
  Administered 2023-03-06 – 2023-03-08 (×2): 50 mg via ORAL
  Filled 2023-03-04 (×2): qty 1

## 2023-03-04 MED ORDER — FUROSEMIDE 40 MG PO TABS
40.0000 mg | ORAL_TABLET | Freq: Every day | ORAL | Status: DC
  Administered 2023-03-04 – 2023-03-06 (×3): 40 mg via ORAL
  Filled 2023-03-04 (×4): qty 1

## 2023-03-04 MED ORDER — TRAZODONE HCL 50 MG PO TABS: 25.0000 mg | ORAL_TABLET | Freq: Every evening | ORAL | Status: DC | PRN

## 2023-03-04 MED ORDER — OXYCODONE-ACETAMINOPHEN 5-325 MG PO TABS
1.0000 | ORAL_TABLET | Freq: Four times a day (QID) | ORAL | Status: DC | PRN
  Administered 2023-03-04 – 2023-03-08 (×9): 2 via ORAL
  Administered 2023-03-08 – 2023-03-09 (×2): 1 via ORAL
  Filled 2023-03-04 (×4): qty 2
  Filled 2023-03-04: qty 1
  Filled 2023-03-04 (×2): qty 2
  Filled 2023-03-04: qty 1
  Filled 2023-03-04 (×4): qty 2

## 2023-03-04 NOTE — Transitions of Care (Post Inpatient/ED Visit) (Unsigned)
   03/04/2023  Name: Nathaniel Warren MRN: [id number] DOB: 23-Jan-1966  Today's TOC FU Call Status: Today's TOC FU Call Status:: Unsuccessful Call (2nd Attempt) Unsuccessful Call (1st Attempt) Date: 03/03/23 Unsuccessful Call (2nd Attempt) Date: 03/04/23  Attempted to reach the patient regarding the most recent Inpatient/ED visit.  Follow Up Plan: Additional outreach attempts will be made to reach the patient to complete the Transitions of Care (Post Inpatient/ED visit) call.   [place] LPN [employer] Advisor Direct Dial [phone]

## 2023-03-04 NOTE — H&P (Signed)
History and Physical  Nathaniel Warren [id number] DOB: 01/22/1966 DOA: 03/04/2023  PCP: [name], NP   Chief Complaint: coughing up blood   HPI: Nathaniel Warren is a 57 y.o. male with medical history significant for alcohol abuse, cirrhosis of the liver, depression, chronic right ankle pain on oxycodone and prior suicide attempt due to PTSD who is being admitted to the hospital with complaints of hemoptysis.  The patient was recently discharged from this facility on 3/25 due to a stay for right arm/left leg swelling consistent with hematoma.  He also has necrotic left fourth fingertip status post I&D.  He was found to be positive for DVT during this hospital stay during right upper extremity Doppler ultrasound on 3/14, he was placed on apixaban.  Prior to discharge, the patient's apixaban was changed to Lovenox due to the patient's liver failure.  He went home on the day, and says that he did well for a couple of days, however this morning he woke up coughing, he is pillow was entirely soaked with bright red blood.  He therefore came to the ER for evaluation.  ED Course: In the emergency department, vital signs are normal.  He is stable on room air.  Says that he has continued to cough up some bright red blood, he has some bloodstained towels with him.  Lab work was done in the ER, he is not significantly changed from prior, he has stable anemia with hemoglobin of 10.5, normal white blood cell count and platelets are slightly low at 122.  CMP is unremarkable from his prior baseline, with normal renal function.  INR is 1.8  Review of Systems: Please see HPI for pertinent positives and negatives. A complete 10 system review of systems are otherwise negative.  Past Medical History:  Diagnosis Date   Cirrhosis ([place])    Depression    Hernia, abdominal    Liver failure ([place])    Seizures ([place])    Stroke ([place])    Tobacco abuse    Past Surgical History:  Procedure Laterality Date   ANKLE  SURGERY     ANKLE SURGERY Right    HERNIA REPAIR     INCISION AND DRAINAGE OF WOUND Left 02/03/2023   Procedure: IRRIGATION AND DEBRIDEMENT WOUND LEFT RING FINGER POSSIBLE AMPUTATION;  Surgeon: Orene Desanctis, MD;  Location: [facility];  Service: Orthopedics;  Laterality: Left;   SPLENECTOMY      Social History:  reports that he has been smoking cigarettes. He has a 30.00 pack-year smoking history. He has quit using smokeless tobacco. He reports that he does not currently use alcohol. He reports current drug use. Drug: Marijuana.   No Known Allergies  Family History  Problem Relation Age of Onset   COPD Mother    Diabetes Father    Stroke Maternal Grandmother    Alzheimer's disease Maternal Grandfather    Cancer Paternal Grandmother    Stomach cancer Paternal Grandfather    Hypertension Other    Colon cancer Neg Hx    Esophageal cancer Neg Hx    Colon polyps Neg Hx    Inflammatory bowel disease Neg Hx    Liver disease Neg Hx    Pancreatic cancer Neg Hx    Rectal cancer Neg Hx      Prior to Admission medications   Medication Sig Start Date End Date Taking? Authorizing Provider  enoxaparin (LOVENOX) 80 MG/0.8ML injection Inject 0.8 mLs (80 mg total) into the skin every 12 (twelve) hours.  For total 3 months 03/02/23 05/21/23  Rai, Vernelle Emerald, MD  furosemide (LASIX) 40 MG tablet Take 1 tablet (40 mg total) by mouth daily. 03/02/23   Rai, Vernelle Emerald, MD  hydrocerin (EUCERIN) CREA Apply 1 Application topically 2 (two) times daily. Apply to right arm 03/02/23   Rai, Ripudeep K, MD  ondansetron (ZOFRAN-ODT) 4 MG disintegrating tablet Dissolve 1 tablet (4 mg total) by mouth every 8 (eight) hours as needed for nausea or vomiting. 03/02/23   Rai, Vernelle Emerald, MD  oxyCODONE-acetaminophen (PERCOCET/ROXICET) 5-325 MG tablet Take 1-2 tablets by mouth every 6 (six) hours as needed for moderate pain or severe pain. 03/02/23   Rai, Vernelle Emerald, MD  PARoxetine (PAXIL) 20 MG tablet Take 1 tablet (20 mg total) by  mouth daily. 03/02/23 05/01/23  Rai, Vernelle Emerald, MD  polyethylene glycol powder (GLYCOLAX/MIRALAX) 17 GM/SCOOP powder Measure 17 grams of powder (see fill line of cap) & mix in 4 oz of liquid/juice & drink by mouth daily as needed for mild or moderate constipation as directed. 03/02/23   Rai, Vernelle Emerald, MD  QUEtiapine (SEROQUEL) 25 MG tablet Take 1 tablet (25 mg total) by mouth at bedtime. 03/02/23   Rai, Vernelle Emerald, MD  spironolactone (ALDACTONE) 50 MG tablet Take 1 tablet (50 mg total) by mouth in the morning. 03/02/23   Mendel Corning, MD    Physical Exam: BP 102/75   Pulse 76   Temp 98.6 F (37 C) (Oral)   Resp 14   Ht 5\' 9"  (1.753 m)   Wt 77.7 kg   SpO2 97%   BMI 25.30 kg/m   General:  Alert, oriented, calm, in no acute distress,  Eyes: EOMI, clear conjuctivae, jaundiced Oral Cavity: Left upper molar with fillings, and dark red blood oozing around it Neck: supple, no masses, trachea mildline  Cardiovascular: RRR, no murmurs or rubs, no peripheral edema  Respiratory: clear to auscultation bilaterally, no wheezes, no crackles  Abdomen: soft, nontender, slightly distended, normal bowel tones heard  Skin: dry, no rashes  Musculoskeletal: no joint effusions, normal range of motion  Psychiatric: appropriate affect, normal speech  Neurologic: extraocular muscles intact, clear speech, moving all extremities with intact sensorium          Labs on Admission:  Basic Metabolic Panel: Recent Labs  Lab 02/26/23 0422 02/27/23 0438 02/28/23 0405 03/04/23 0430  NA 135 135 135 135  K 3.8 3.9 4.0 3.5  CL 103 104 104 102  CO2 27 25 26 25   GLUCOSE 102* 92 93 109*  BUN 18 14 15 10   CREATININE 0.94 0.85 0.84 0.77  CALCIUM 8.3* 8.2* 8.2* 8.7*  MG 1.8 1.6* 1.8  --   PHOS 4.3 4.0 4.0  --    Liver Function Tests: Recent Labs  Lab 02/26/23 0422 02/27/23 0438 02/28/23 0405 03/04/23 0430  AST 65* 65* 63* 67*  ALT 22 21 21 23   ALKPHOS 148* 139* 126 159*  BILITOT 5.6* 5.1* 4.9* 5.9*   PROT 6.0* 5.8* 5.8* 6.8  ALBUMIN 2.1* 2.0* 1.9* 2.5*   No results for input(s): "LIPASE", "AMYLASE" in the last 168 hours. No results for input(s): "AMMONIA" in the last 168 hours. CBC: Recent Labs  Lab 02/26/23 0422 02/27/23 0438 02/28/23 0405 03/04/23 0430  WBC 7.7 9.7 9.5 9.2  NEUTROABS  --   --   --  3.8  HGB 9.1* 9.3* 9.6* 10.5*  HCT 27.4* 28.0* 28.0* 31.0*  MCV 110.9* 112.0* 109.8* 109.9*  PLT 100* 105*  108* 122*   Cardiac Enzymes: No results for input(s): "CKTOTAL", "CKMB", "CKMBINDEX", "TROPONINI" in the last 168 hours.  BNP (last 3 results) Recent Labs    01/22/23 1928  BNP 161.2*    ProBNP (last 3 results) No results for input(s): "PROBNP" in the last 8760 hours.  CBG: No results for input(s): "GLUCAP" in the last 168 hours.  Radiological Exams on Admission: CT Angio Chest PE W and/or Wo Contrast  Result Date: 03/04/2023 CLINICAL DATA:  Dyspnea with chest wall or pleural disease suspected. Hemoptysis. EXAM: CT ANGIOGRAPHY CHEST WITH CONTRAST TECHNIQUE: Multidetector CT imaging of the chest was performed using the standard protocol during bolus administration of intravenous contrast. Multiplanar CT image reconstructions and MIPs were obtained to evaluate the vascular anatomy. RADIATION DOSE REDUCTION: This exam was performed according to the departmental dose-optimization program which includes automated exposure control, adjustment of the mA and/or kV according to patient size and/or use of iterative reconstruction technique. CONTRAST:  58mL OMNIPAQUE IOHEXOL 350 MG/ML SOLN COMPARISON:  12/01/2022 FINDINGS: Cardiovascular: The heart is enlarged. No substantial pericardial effusion. Coronary artery calcification is evident. Mild atherosclerotic calcification is noted in the wall of the thoracic aorta. Enlargement of the pulmonary outflow tract/main pulmonary arteries suggests pulmonary arterial hypertension. There is no filling defect within the opacified pulmonary  arteries to suggest the presence of an acute pulmonary embolus. While no definite filling defect is seen in segmental or subsegmental pulmonary arteries to the lower lobes, assessment is substantially degraded by breathing motion. Mediastinum/Nodes: No mediastinal lymphadenopathy. There is no hilar lymphadenopathy. The esophagus has normal imaging features. There is no axillary lymphadenopathy. Lungs/Pleura: Fine architectural detail the lungs obscured by motion artifact. There is left base collapse/consolidation with small left pleural effusion. Mosaic ground-glass attenuation in the upper lungs is nonspecific but can be seen in the setting of small airways disease/air trapping. No suspicious pulmonary nodule or mass. Upper Abdomen: Nodular liver contour is compatible with cirrhosis. Small to moderate volume ascites noted around the liver with edema in the omentum and visualized upper abdominal mesentery. Lymphadenopathy in the porta hepatis may be secondary to the liver disease. Musculoskeletal: No worrisome lytic or sclerotic osseous abnormality. Bilateral gynecomastia evident. Mild body wall edema noted lower chest and visualized upper abdomen. Review of the MIP images confirms the above findings. IMPRESSION: 1. No CT evidence for acute pulmonary embolus. While no definite filling defect is seen in segmental or subsegmental pulmonary arteries to the lower lobes, assessment is substantially degraded by breathing motion. 2. Enlargement of the pulmonary outflow tract/main pulmonary arteries suggests pulmonary arterial hypertension. 3. Left base collapse/consolidation with small left pleural effusion. 4. Cirrhotic liver morphology with small to moderate volume ascites around the liver with edema in the omentum and visualized upper abdominal mesentery. 5. Lymphadenopathy in the porta hepatis may be secondary to the liver disease. 6. Mild body wall edema.  Bilateral gynecomastia. 7.  Aortic Atherosclerosis  (ICD10-I70.0). Electronically Signed   By: Misty Stanley M.D.   On: 03/04/2023 05:59    Assessment/Plan Principal Problem:   Hemoptysis -I think this is actually coming from his left upper molar which is losing some blood.  No obvious lesions or ulcers around it.  I had a detailed discussion with Mr. Eisner about the risks versus benefits of holding and continuing his Lovenox. -Observation admission -Hold Lovenox today, with plan to restart and observe closely once bleeding abates -ER staff is contacting pharmacy for options to directly stop the bleeding -If bleeding worsens today, or  continues into tomorrow, would consider oral surgery consult -Note hemoglobin is at baseline, will trend -Will continue his other chronic medications as prescribed when he was discharged 2 days ago Active Problems:   Alcoholic cirrhosis of liver with ascites (HCC)   Chronic pain of right ankle   Anxiety   GAD (generalized anxiety disorder)   Portal hypertension (HCC)   Acute deep vein thrombosis (DVT) of brachial vein of left upper extremity (HCC)  DVT prophylaxis: Holding due to active bleed    Code Status: Full Code, discussed with patient this morning in the ER  Consults called: None  Admission status: Observation  Time spent: 48 minutes  Sani Madariaga Neva Seat MD [facility] Pager [phone]  If 7PM-7AM, please contact night-coverage [email or web address] Password [facility]  03/04/2023, 9:57 AM

## 2023-03-04 NOTE — ED Notes (Signed)
ED TO INPATIENT HANDOFF REPORT  ED Nurse Name and Phone #: Jodell Cipro Name/Age/Gender Nathaniel Warren 57 y.o. male Room/Bed: WA19/WA19  Code Status   Code Status: Full Code  Home/SNF/Other Home Patient oriented to: self, place, time, and situation Is this baseline? Yes   Triage Complete: Triage complete  Chief Complaint Hemoptysis [R04.2]  Triage Note Pt presents to ED for evaluation of hemoptysis x2 hours after awakening per patient.    Allergies No Known Allergies  Level of Care/Admitting Diagnosis ED Disposition     ED Disposition  Admit   Condition  --   Comment  Hospital Area: [place] [id number]  Level of Care: Telemetry [5]  Admit to tele based on following criteria: Monitor for Ischemic changes  May place patient in observation at [facility] or [place] if equivalent level of care is available:: Yes  Covid Evaluation: Asymptomatic - no recent exposure (last 10 days) testing not required  Diagnosis: Hemoptysis [741752]  Admitting Physician: Lucillie Garfinkel [id number]  Attending Physician: Hollice Gong, MIR M [[id number]]          B Medical/Surgery History Past Medical History:  Diagnosis Date   Cirrhosis ([place])    Depression    Hernia, abdominal    Liver failure ([place])    Seizures ([place])    Stroke ([place])    Tobacco abuse    Past Surgical History:  Procedure Laterality Date   ANKLE SURGERY     ANKLE SURGERY Right    HERNIA REPAIR     INCISION AND DRAINAGE OF WOUND Left 02/03/2023   Procedure: IRRIGATION AND DEBRIDEMENT WOUND LEFT RING FINGER POSSIBLE AMPUTATION;  Surgeon: Orene Desanctis, MD;  Location: [facility];  Service: Orthopedics;  Laterality: Left;   SPLENECTOMY       A IV Location/Drains/Wounds Patient Lines/Drains/Airways Status     Active Line/Drains/Airways     Name Placement date Placement time Site Days   Peripheral IV 03/04/23 20 G Right Antecubital 03/04/23  0437  Antecubital  less than 1   Pressure  Injury 02/09/23 Buttocks Left Stage 2 -  Partial thickness loss of dermis presenting as a shallow open injury with a red, pink wound bed without slough. 02/09/23  1710  -- 23   Wound / Incision (Open or Dehisced) 02/22/22 Skin tear Arm Left;Lower;Posterior Pink and red wound bed 02/22/22  2002  Arm  375   Wound / Incision (Open or Dehisced) 02/01/23 Laceration;Puncture Finger (Comment which one) Left;Posterior;Distal torn tissue at finger tip, Laceration pink, tissue white, non blanchable 02/01/23  2000  Finger (Comment which one)  31            Intake/Output Last 24 hours No intake or output data in the 24 hours ending 03/04/23 1152  Labs/Imaging Results for orders placed or performed during the hospital encounter of 03/04/23 (from the past 48 hour(s))  CBC with Differential     Status: Abnormal   Collection Time: 03/04/23  4:30 AM  Result Value Ref Range   WBC 9.2 4.0 - 10.5 K/uL   RBC 2.82 (L) 4.22 - 5.81 MIL/uL   Hemoglobin 10.5 (L) 13.0 - 17.0 g/dL   HCT 31.0 (L) 39.0 - 52.0 %   MCV 109.9 (H) 80.0 - 100.0 fL   MCH 37.2 (H) 26.0 - 34.0 pg   MCHC 33.9 30.0 - 36.0 g/dL   RDW 17.4 (H) 11.5 - 15.5 %   Platelets 122 (L) 150 - 400 K/uL   nRBC  0.0 0.0 - 0.2 %   Neutrophils Relative % 41 %   Neutro Abs 3.8 1.7 - 7.7 K/uL   Lymphocytes Relative 35 %   Lymphs Abs 3.2 0.7 - 4.0 K/uL   Monocytes Relative 15 %   Monocytes Absolute 1.3 (H) 0.1 - 1.0 K/uL   Eosinophils Relative 8 %   Eosinophils Absolute 0.8 (H) 0.0 - 0.5 K/uL   Basophils Relative 1 %   Basophils Absolute 0.1 0.0 - 0.1 K/uL   Immature Granulocytes 0 %   Abs Immature Granulocytes 0.03 0.00 - 0.07 K/uL    Comment: Performed at [facility], [place] [address], [place], [place] [address]  Comprehensive metabolic panel     Status: Abnormal   Collection Time: 03/04/23  4:30 AM  Result Value Ref Range   Sodium 135 135 - 145 mmol/L   Potassium 3.5 3.5 - 5.1 mmol/L   Chloride 102 98 - 111 mmol/L   CO2 25 22  - 32 mmol/L   Glucose, Bld 109 (H) 70 - 99 mg/dL    Comment: Glucose reference range applies only to samples taken after fasting for at least 8 hours.   BUN 10 6 - 20 mg/dL   Creatinine, Ser 0.77 0.61 - 1.24 mg/dL   Calcium 8.7 (L) 8.9 - 10.3 mg/dL   Total Protein 6.8 6.5 - 8.1 g/dL   Albumin 2.5 (L) 3.5 - 5.0 g/dL   AST 67 (H) 15 - 41 U/L   ALT 23 0 - 44 U/L   Alkaline Phosphatase 159 (H) 38 - 126 U/L   Total Bilirubin 5.9 (H) 0.3 - 1.2 mg/dL   GFR, Estimated >60 >60 mL/min    Comment: (NOTE) Calculated using the CKD-EPI Creatinine Equation (2021)    Anion gap 8 5 - 15    Comment: Performed at [facility], [place] [address] [place], [place] [address]  Protime-INR     Status: Abnormal   Collection Time: 03/04/23  4:30 AM  Result Value Ref Range   Prothrombin Time 20.4 (H) 11.4 - 15.2 seconds   INR 1.8 (H) 0.8 - 1.2    Comment: (NOTE) INR goal varies based on device and disease states. Performed at [facility], [place] [address], [place], [place] [address]   Type and screen     Status: None   Collection Time: 03/04/23  4:30 AM  Result Value Ref Range   ABO/RH(D) O POS    Antibody Screen NEG    Sample Expiration      03/07/2023,2359 Performed at [facility], [place] [address] [place], [place] [address]    CT Angio Chest PE W and/or Wo Contrast  Result Date: 03/04/2023 CLINICAL DATA:  Dyspnea with chest wall or pleural disease suspected. Hemoptysis. EXAM: CT ANGIOGRAPHY CHEST WITH CONTRAST TECHNIQUE: Multidetector CT imaging of the chest was performed using the standard protocol during bolus administration of intravenous contrast. Multiplanar CT image reconstructions and MIPs were obtained to evaluate the vascular anatomy. RADIATION DOSE REDUCTION: This exam was performed according to the departmental dose-optimization program which includes automated exposure control, adjustment of the mA and/or kV according to patient size  and/or use of iterative reconstruction technique. CONTRAST:  7mL OMNIPAQUE IOHEXOL 350 MG/ML SOLN COMPARISON:  12/01/2022 FINDINGS: Cardiovascular: The heart is enlarged. No substantial pericardial effusion. Coronary artery calcification is evident. Mild atherosclerotic calcification is noted in the wall of the thoracic aorta. Enlargement of the pulmonary outflow tract/main pulmonary arteries suggests pulmonary  arterial hypertension. There is no filling defect within the opacified pulmonary arteries to suggest the presence of an acute pulmonary embolus. While no definite filling defect is seen in segmental or subsegmental pulmonary arteries to the lower lobes, assessment is substantially degraded by breathing motion. Mediastinum/Nodes: No mediastinal lymphadenopathy. There is no hilar lymphadenopathy. The esophagus has normal imaging features. There is no axillary lymphadenopathy. Lungs/Pleura: Fine architectural detail the lungs obscured by motion artifact. There is left base collapse/consolidation with small left pleural effusion. Mosaic ground-glass attenuation in the upper lungs is nonspecific but can be seen in the setting of small airways disease/air trapping. No suspicious pulmonary nodule or mass. Upper Abdomen: Nodular liver contour is compatible with cirrhosis. Small to moderate volume ascites noted around the liver with edema in the omentum and visualized upper abdominal mesentery. Lymphadenopathy in the porta hepatis may be secondary to the liver disease. Musculoskeletal: No worrisome lytic or sclerotic osseous abnormality. Bilateral gynecomastia evident. Mild body wall edema noted lower chest and visualized upper abdomen. Review of the MIP images confirms the above findings. IMPRESSION: 1. No CT evidence for acute pulmonary embolus. While no definite filling defect is seen in segmental or subsegmental pulmonary arteries to the lower lobes, assessment is substantially degraded by breathing motion. 2.  Enlargement of the pulmonary outflow tract/main pulmonary arteries suggests pulmonary arterial hypertension. 3. Left base collapse/consolidation with small left pleural effusion. 4. Cirrhotic liver morphology with small to moderate volume ascites around the liver with edema in the omentum and visualized upper abdominal mesentery. 5. Lymphadenopathy in the porta hepatis may be secondary to the liver disease. 6. Mild body wall edema.  Bilateral gynecomastia. 7.  Aortic Atherosclerosis (ICD10-I70.0). Electronically Signed   By: Misty Stanley M.D.   On: 03/04/2023 05:59    Pending Labs Unresulted Labs (From admission, onward)     Start     Ordered   03/05/23 [id number]  Basic metabolic panel  Tomorrow morning,   R        03/04/23 0957   03/05/23 0500  CBC  Tomorrow morning,   R        03/04/23 0957   03/04/23 2100  CBC  Once,   R        03/04/23 0957   03/04/23 0955  HIV Antibody (routine testing w rflx)  (HIV Antibody (Routine testing w reflex) panel)  Once,   R        03/04/23 0957   03/04/23 0742  Strep pneumoniae urinary antigen  Once,   URGENT        03/04/23 0741            Vitals/Pain Today's Vitals   03/04/23 0445 03/04/23 0500 03/04/23 0600 03/04/23 0842  BP: 109/65 109/65 102/75   Pulse: 65 63 76   Resp: 14 11 14    Temp:    98.6 F (37 C)  TempSrc:    Oral  SpO2: 97% 100% 97%   Weight:      Height:      PainSc:        Isolation Precautions No active isolations  Medications Medications  sodium chloride (PF) 0.9 % injection (has no administration in time range)  oxyCODONE-acetaminophen (PERCOCET/ROXICET) 5-325 MG per tablet 1-2 tablet (has no administration in time range)  furosemide (LASIX) tablet 40 mg (40 mg Oral Given 03/04/23 1134)  spironolactone (ALDACTONE) tablet 50 mg (50 mg Oral Given 03/04/23 1134)  PARoxetine (PAXIL) tablet 20 mg (20 mg Oral Given 03/04/23 1135)  QUEtiapine (  SEROQUEL) tablet 25 mg (has no administration in time range)  polyethylene glycol  (MIRALAX / GLYCOLAX) packet 17 g (has no administration in time range)  traMADol (ULTRAM) tablet 50 mg (has no administration in time range)  traZODone (DESYREL) tablet 25 mg (has no administration in time range)  docusate sodium (COLACE) capsule 100 mg (100 mg Oral Given 03/04/23 1134)  ondansetron (ZOFRAN) tablet 4 mg (has no administration in time range)    Or  ondansetron (ZOFRAN) injection 4 mg (has no administration in time range)  albuterol (PROVENTIL) (2.5 MG/3ML) 0.083% nebulizer solution 2.5 mg (has no administration in time range)  iohexol (OMNIPAQUE) 350 MG/ML injection 75 mL (75 mLs Intravenous Contrast Given 03/04/23 0525)    Mobility walks     Focused Assessments Cardiac Assessment Handoff:    Lab Results  Component Value Date   CKTOTAL 81 01/22/2023   Lab Results  Component Value Date   DDIMER (H) 06/01/2010    2.43        AT THE INHOUSE ESTABLISHED CUTOFF VALUE OF 0.48 ug/mL FEU, THIS ASSAY HAS BEEN DOCUMENTED IN THE LITERATURE TO HAVE A SENSITIVITY AND NEGATIVE PREDICTIVE VALUE OF AT LEAST 98 TO 99%.  THE TEST RESULT SHOULD BE CORRELATED WITH AN ASSESSMENT OF THE CLINICAL PROBABILITY OF DVT / VTE.   Does the Patient currently have chest pain? No    R Recommendations: See Admitting Provider Note  Report given to:   Additional Notes:

## 2023-03-04 NOTE — Progress Notes (Signed)
Chaplain engaged in an initial visit with Nathaniel Warren, his father, and bonus mom. Chaplain provided education on Scientist, physiological, Healthcare POA. Tareek desires to assign his bonus mom and daughter as his healthcare agents. They are going to go through the paperwork today then have Chaplain paged tomorrow when they are ready. Chaplain did assess that because Stokely wants to assign his bonus mom, he does need to complete the healthcare POA documents.   Chaplain provided support and answered questions from [place] and family. A Chaplain will follow-up tomorrow.   Bea Graff, MDiv  03/04/23 1400  Spiritual Encounters  Type of Visit Initial  Care provided to: Patient;Pt and family  Reason for visit Advance directives

## 2023-03-04 NOTE — Consult Note (Signed)
NAME:  Nathaniel Warren, MRN:  [id number], DOB:  1966/11/15, LOS: 0 ADMISSION DATE:  03/04/2023, CONSULTATION DATE:  3/27 REFERRING MD:  Dr. Randal Buba, CHIEF COMPLAINT:  Woke up with blood on my pillow, coughing/spitting up blood  History of Present Illness:  57 y/o M who presented to [facility] on 3/27 with reports of waking up with blood on my pillow, coughing/spitting up blood.   The patient was recently admitted from 2/15-3/25 for swelling of left groin, right arm with no source of trauma.  He was found to have a hematoma in the left groin and right hand with left fingertip necrosis. During that admission, he was seen by Ortho/Hand Surgery who performed an I&D on left ring finger, left dorsal hand, soft tissue rearrangement.  In addition, he was found to have an acute DVT of the brachial vein of LUE.  Due to his history of alcoholic cirrhosis, it was recommended he use lovenox 1mg /kg instead of DOAC.  There were issues with self administration due to swelling of hand. Additionally, he reported the loss of his son & mother and was experiencing suicidal ideations. He was seen by Psychiatry, placed under IVC but was subsequently cleared, recommendations of paxil & seroquel.   He returned to the ER on 3/27 with reports of cough with bloody sputum.  He reports he woke around 1am with a cough and blood was all over his pillow. He called EMS. Reports he filled a towel and two washcloths wit blood.  Feels mildly nauseated and weak.  Hgb on arrival 10.5.  He reports feeling fine when going to bed. Denies fevers, chills, prior cough, blood in stool.  CTA of the chest was notable for enlarged pulmonary trunk, LLL small effusion & consolidation. He was afebrile with no leukocytosis.   PCCM consulted for evaluation.   Pertinent  Medical History  Depression  Hernia Cirrhosis  Liver Failure  Splenectomy  DVT - on anticoagulation / lovenox, planned 3/25-6/13 Seizures CVA Tobacco Abuse   Significant Hospital  Events: Including procedures, antibiotic start and stop dates in addition to other pertinent events   3/27 Admit with bloody oral secretions   Interim History / Subjective:  Afebrile / no leukocytosis  As above  Objective   Blood pressure 102/75, pulse 76, temperature 98.7 F (37.1 C), temperature source Oral, resp. rate 14, height 5\' 9"  (1.753 m), weight 77.7 kg, SpO2 97 %.       No intake or output data in the 24 hours ending 03/04/23 0728 Filed Weights   03/04/23 0415  Weight: 77.7 kg    Examination: General: chronically ill appearing adult male lying in bed in NAD, jaundiced, pleasant  HENT: MM with blood on right tongue, lips, noted upper molar clot and with small amt bleeding. Mouth rinsed with ice water, residual clot remains on upper left molar Lungs: non-labored at rest, lungs bilaterally clear Cardiovascular: [id number] RRR, pansystolic murmur Abdomen: protuberant, soft, bsx4 active  Extremities: warm/dry, BLE 1+ edema, RUE with purple discoloration, L hand with dressing & left ring finger wrapped  Neuro: AAOx4, speech clear, MAE   Resolved Hospital Problem list     Assessment & Plan:   Bloody Oral Secretions  Dental Bleeding, Left Upper Molar LLL Consolidation Suspect oral bleeding source of incident prompting evaluation.  Woke with cough was likely him clearing oral secretions.  No significant radiographic findings on CTA to prompt bleeding.  Does have small consolidation in LLE which may be an aspiration.  No fever or  leukocytosis.  No preceding symptoms to suggest active infection.   -admission per [facility], reasonable to observe for further significant bleeding  -consider holding lovenox for 24 hours, resume in am  -monitor for further bleeding  -dental consult if available while inpatient  -if further oral bleeding, soak gauze in TXA and have patient bite down on gauze -hold abx for now, if new evidence of infection could consider treatment for possible  aspiration -follow CBC   Nausea  Likely in setting of swallowing blood  -PRN antiemetics  -reviewed with patient that he may transiently notice dark stools but that this should clear and if it does not, he should notify MD  Liver Cirrhosis  ETOH Abuse  -per primary   Best Practice (right click and "Reselect all SmartList Selections" daily)  Per Primary    PCCM will be available PRN. Please call back if new needs arise.   Labs   CBC: Recent Labs  Lab 02/26/23 0422 02/27/23 0438 02/28/23 0405 03/04/23 0430  WBC 7.7 9.7 9.5 9.2  NEUTROABS  --   --   --  3.8  HGB 9.1* 9.3* 9.6* 10.5*  HCT 27.4* 28.0* 28.0* 31.0*  MCV 110.9* 112.0* 109.8* 109.9*  PLT 100* 105* 108* 122*    Basic Metabolic Panel: Recent Labs  Lab 02/26/23 0422 02/27/23 0438 02/28/23 0405 03/04/23 0430  NA 135 135 135 135  K 3.8 3.9 4.0 3.5  CL 103 104 104 102  CO2 27 25 26 25   GLUCOSE 102* 92 93 109*  BUN 18 14 15 10   CREATININE 0.94 0.85 0.84 0.77  CALCIUM 8.3* 8.2* 8.2* 8.7*  MG 1.8 1.6* 1.8  --   PHOS 4.3 4.0 4.0  --    GFR: Estimated Creatinine Clearance: 104.3 mL/min (by C-G formula based on SCr of 0.77 mg/dL). Recent Labs  Lab 02/26/23 0422 02/27/23 0438 02/28/23 0405 03/04/23 0430  WBC 7.7 9.7 9.5 9.2    Liver Function Tests: Recent Labs  Lab 02/26/23 0422 02/27/23 0438 02/28/23 0405 03/04/23 0430  AST 65* 65* 63* 67*  ALT 22 21 21 23   ALKPHOS 148* 139* 126 159*  BILITOT 5.6* 5.1* 4.9* 5.9*  PROT 6.0* 5.8* 5.8* 6.8  ALBUMIN 2.1* 2.0* 1.9* 2.5*   No results for input(s): "LIPASE", "AMYLASE" in the last 168 hours. No results for input(s): "AMMONIA" in the last 168 hours.  ABG    Component Value Date/Time   TCO2 24 03/23/2013 1452     Coagulation Profile: Recent Labs  Lab 03/04/23 0430  INR 1.8*    Cardiac Enzymes: No results for input(s): "CKTOTAL", "CKMB", "CKMBINDEX", "TROPONINI" in the last 168 hours.  HbA1C: Hgb A1c MFr Bld  Date/Time Value Ref Range  Status  02/09/2023 04:48 PM 4.5 (L) 4.8 - 5.6 % Final    Comment:    (NOTE)         Prediabetes: 5.7 - 6.4         Diabetes: >6.4         Glycemic control for adults with diabetes: <7.0   10/28/2017 06:51 AM 5.3 4.8 - 5.6 % Final    Comment:    (NOTE) Pre diabetes:          5.7%-6.4% Diabetes:              >6.4% Glycemic control for   <7.0% adults with diabetes     CBG: No results for input(s): "GLUCAP" in the last 168 hours.  Review of Systems: Positives in  Bold   Gen: Denies fever, chills, weight change, fatigue, night sweats HEENT: Denies blurred vision, double vision, hearing loss, tinnitus, sinus congestion, rhinorrhea, sore throat, neck stiffness, dysphagia, bloody oral secretions PULM: Denies shortness of breath, cough, sputum production, hemoptysis, wheezing CV: Denies chest pain, edema, orthopnea, paroxysmal nocturnal dyspnea, palpitations GI: Denies abdominal pain, nausea, vomiting, diarrhea, hematochezia, melena, constipation, change in bowel habits GU: Denies dysuria, hematuria, polyuria, oliguria, urethral discharge Endocrine: Denies hot or cold intolerance, polyuria, polyphagia or appetite change Derm: Denies rash, dry skin, scaling or peeling skin change Heme: Denies easy bruising, bleeding, bleeding gums Neuro: Denies headache, numbness, weakness, slurred speech, loss of memory or consciousness  Past Medical History:  He,  has a past medical history of Depression, Hernia, abdominal, Liver failure ([place]), Seizures ([place]), and Stroke ([place]).   Surgical History:   Past Surgical History:  Procedure Laterality Date   ANKLE SURGERY     ANKLE SURGERY Right    HERNIA REPAIR     INCISION AND DRAINAGE OF WOUND Left 02/03/2023   Procedure: IRRIGATION AND DEBRIDEMENT WOUND LEFT RING FINGER POSSIBLE AMPUTATION;  Surgeon: Orene Desanctis, MD;  Location: [facility] ORS;  Service: Orthopedics;  Laterality: Left;   SPLENECTOMY       Social History:   reports that he has been smoking  cigarettes. He has a 30.00 pack-year smoking history. He has quit using smokeless tobacco. He reports that he does not currently use alcohol. He reports current drug use. Drug: Marijuana.   Family History:  His family history includes Alzheimer's disease in his maternal grandfather; COPD in his mother; Cancer in his paternal grandmother; Diabetes in his father; Hypertension in an other family member; Stomach cancer in his paternal grandfather; Stroke in his maternal grandmother. There is no history of Colon cancer, Esophageal cancer, Colon polyps, Inflammatory bowel disease, Liver disease, Pancreatic cancer, or Rectal cancer.   Allergies No Known Allergies   Home Medications  Prior to Admission medications   Medication Sig Start Date End Date Taking? Authorizing Provider  enoxaparin (LOVENOX) 80 MG/0.8ML injection Inject 0.8 mLs (80 mg total) into the skin every 12 (twelve) hours. For total 3 months 03/02/23 05/21/23  Rai, Vernelle Emerald, MD  furosemide (LASIX) 40 MG tablet Take 1 tablet (40 mg total) by mouth daily. 03/02/23   Rai, Vernelle Emerald, MD  hydrocerin (EUCERIN) CREA Apply 1 Application topically 2 (two) times daily. Apply to right arm 03/02/23   Rai, Ripudeep K, MD  ondansetron (ZOFRAN-ODT) 4 MG disintegrating tablet Dissolve 1 tablet (4 mg total) by mouth every 8 (eight) hours as needed for nausea or vomiting. 03/02/23   Rai, Vernelle Emerald, MD  oxyCODONE-acetaminophen (PERCOCET/ROXICET) 5-325 MG tablet Take 1-2 tablets by mouth every 6 (six) hours as needed for moderate pain or severe pain. 03/02/23   Rai, Vernelle Emerald, MD  PARoxetine (PAXIL) 20 MG tablet Take 1 tablet (20 mg total) by mouth daily. 03/02/23 05/01/23  Rai, Vernelle Emerald, MD  polyethylene glycol powder (GLYCOLAX/MIRALAX) 17 GM/SCOOP powder Measure 17 grams of powder (see fill line of cap) & mix in 4 oz of liquid/juice & drink by mouth daily as needed for mild or moderate constipation as directed. 03/02/23   Rai, Vernelle Emerald, MD  QUEtiapine  (SEROQUEL) 25 MG tablet Take 1 tablet (25 mg total) by mouth at bedtime. 03/02/23   Rai, Vernelle Emerald, MD  spironolactone (ALDACTONE) 50 MG tablet Take 1 tablet (50 mg total) by mouth in the morning. 03/02/23   Mendel Corning, MD  Critical care time: n/a     Noe Gens, MSN, APRN, NP-C, AGACNP-BC [facility] 03/04/2023, 7:28 AM   Please see [email or web address] for pager details.   From 7A-7P if no response, please call [phone] After hours, please call ELink [phone]

## 2023-03-04 NOTE — ED Triage Notes (Signed)
Pt presents to ED for evaluation of hemoptysis x2 hours after awakening per patient.

## 2023-03-04 NOTE — Plan of Care (Signed)

## 2023-03-04 NOTE — ED Provider Notes (Signed)
[place] EMERGENCY DEPARTMENT AT [facility] Provider Note   CSN: [id number] Arrival date & time: 03/04/23  0411     History  Chief Complaint  Patient presents with   Hemoptysis    Nathaniel Warren is a 57 y.o. male.  The history is provided by the patient.  Cough Cough characteristics: bloody. Sputum characteristics:  Bloody Severity:  Moderate Onset quality:  Gradual Duration: hours. Timing:  Intermittent Chronicity:  New Context: not with activity   Relieved by:  Nothing Worsened by:  Nothing Ineffective treatments:  None tried Associated symptoms: no chest pain and no diaphoresis   Risk factors: no chemical exposure   Patient on Lovenox for DVT presents with large hemoptysis.       Home Medications Prior to Admission medications   Medication Sig Start Date End Date Taking? Authorizing Provider  enoxaparin (LOVENOX) 80 MG/0.8ML injection Inject 0.8 mLs (80 mg total) into the skin every 12 (twelve) hours. For total 3 months 03/02/23 05/21/23  Rai, Vernelle Emerald, MD  furosemide (LASIX) 40 MG tablet Take 1 tablet (40 mg total) by mouth daily. 03/02/23   Rai, Vernelle Emerald, MD  hydrocerin (EUCERIN) CREA Apply 1 Application topically 2 (two) times daily. Apply to right arm 03/02/23   Rai, Ripudeep K, MD  ondansetron (ZOFRAN-ODT) 4 MG disintegrating tablet Dissolve 1 tablet (4 mg total) by mouth every 8 (eight) hours as needed for nausea or vomiting. 03/02/23   Rai, Vernelle Emerald, MD  oxyCODONE-acetaminophen (PERCOCET/ROXICET) 5-325 MG tablet Take 1-2 tablets by mouth every 6 (six) hours as needed for moderate pain or severe pain. 03/02/23   Rai, Vernelle Emerald, MD  PARoxetine (PAXIL) 20 MG tablet Take 1 tablet (20 mg total) by mouth daily. 03/02/23 05/01/23  Rai, Vernelle Emerald, MD  polyethylene glycol powder (GLYCOLAX/MIRALAX) 17 GM/SCOOP powder Measure 17 grams of powder (see fill line of cap) & mix in 4 oz of liquid/juice & drink by mouth daily as needed for mild or moderate  constipation as directed. 03/02/23   Rai, Vernelle Emerald, MD  QUEtiapine (SEROQUEL) 25 MG tablet Take 1 tablet (25 mg total) by mouth at bedtime. 03/02/23   Rai, Vernelle Emerald, MD  spironolactone (ALDACTONE) 50 MG tablet Take 1 tablet (50 mg total) by mouth in the morning. 03/02/23   Mendel Corning, MD      Allergies    Patient has no known allergies.    Review of Systems   Review of Systems  Constitutional:  Negative for diaphoresis.  HENT:  Negative for facial swelling.   Eyes:  Negative for redness.  Respiratory:  Positive for cough.   Cardiovascular:  Negative for chest pain.  Gastrointestinal:  Negative for vomiting.  Neurological:  Negative for facial asymmetry.  All other systems reviewed and are negative.   Physical Exam Updated Vital Signs BP 102/75   Pulse 76   Temp 98.7 F (37.1 C) (Oral)   Resp 14   Ht 5\' 9"  (1.753 m)   Wt 77.7 kg   SpO2 97%   BMI 25.30 kg/m  Physical Exam Vitals and nursing note reviewed.  Constitutional:      General: He is not in acute distress.    Appearance: He is well-developed. He is not diaphoretic.  HENT:     Head: Normocephalic and atraumatic.  Eyes:     Conjunctiva/sclera: Conjunctivae normal.     Pupils: Pupils are equal, round, and reactive to light.  Cardiovascular:     Rate and Rhythm:  Normal rate and regular rhythm.     Pulses: Normal pulses.     Heart sounds: Normal heart sounds.  Pulmonary:     Effort: Pulmonary effort is normal. Tachypnea present.     Breath sounds: Examination of the right-lower field reveals decreased breath sounds. Examination of the left-lower field reveals decreased breath sounds. Decreased breath sounds present. No wheezing or rales.  Abdominal:     General: Bowel sounds are normal.     Palpations: Abdomen is soft.     Tenderness: There is no abdominal tenderness. There is no guarding or rebound.  Musculoskeletal:        General: Normal range of motion.     Cervical back: Normal range of motion and  neck supple.     Right lower leg: Edema present.     Left lower leg: Edema present.  Skin:    General: Skin is warm and dry.     Capillary Refill: Capillary refill takes less than 2 seconds.  Neurological:     General: No focal deficit present.     Mental Status: He is alert and oriented to person, place, and time.     Deep Tendon Reflexes: Reflexes normal.  Psychiatric:        Mood and Affect: Mood normal.     ED Results / Procedures / Treatments   Labs (all labs ordered are listed, but only abnormal results are displayed) Results for orders placed or performed during the hospital encounter of 03/04/23  CBC with Differential  Result Value Ref Range   WBC 9.2 4.0 - 10.5 K/uL   RBC 2.82 (L) 4.22 - 5.81 MIL/uL   Hemoglobin 10.5 (L) 13.0 - 17.0 g/dL   HCT 31.0 (L) 39.0 - 52.0 %   MCV 109.9 (H) 80.0 - 100.0 fL   MCH 37.2 (H) 26.0 - 34.0 pg   MCHC 33.9 30.0 - 36.0 g/dL   RDW 17.4 (H) 11.5 - 15.5 %   Platelets 122 (L) 150 - 400 K/uL   nRBC 0.0 0.0 - 0.2 %   Neutrophils Relative % 41 %   Neutro Abs 3.8 1.7 - 7.7 K/uL   Lymphocytes Relative 35 %   Lymphs Abs 3.2 0.7 - 4.0 K/uL   Monocytes Relative 15 %   Monocytes Absolute 1.3 (H) 0.1 - 1.0 K/uL   Eosinophils Relative 8 %   Eosinophils Absolute 0.8 (H) 0.0 - 0.5 K/uL   Basophils Relative 1 %   Basophils Absolute 0.1 0.0 - 0.1 K/uL   Immature Granulocytes 0 %   Abs Immature Granulocytes 0.03 0.00 - 0.07 K/uL  Comprehensive metabolic panel  Result Value Ref Range   Sodium 135 135 - 145 mmol/L   Potassium 3.5 3.5 - 5.1 mmol/L   Chloride 102 98 - 111 mmol/L   CO2 25 22 - 32 mmol/L   Glucose, Bld 109 (H) 70 - 99 mg/dL   BUN 10 6 - 20 mg/dL   Creatinine, Ser 0.77 0.61 - 1.24 mg/dL   Calcium 8.7 (L) 8.9 - 10.3 mg/dL   Total Protein 6.8 6.5 - 8.1 g/dL   Albumin 2.5 (L) 3.5 - 5.0 g/dL   AST 67 (H) 15 - 41 U/L   ALT 23 0 - 44 U/L   Alkaline Phosphatase 159 (H) 38 - 126 U/L   Total Bilirubin 5.9 (H) 0.3 - 1.2 mg/dL   GFR,  Estimated >60 >60 mL/min   Anion gap 8 5 - 15  Protime-INR  Result Value  Ref Range   Prothrombin Time 20.4 (H) 11.4 - 15.2 seconds   INR 1.8 (H) 0.8 - 1.2  Type and screen  Result Value Ref Range   ABO/RH(D) O POS    Antibody Screen NEG    Sample Expiration      03/07/2023,2359 Performed at [facility] [address], [place], [place] [address]    CT Angio Chest PE W and/or Wo Contrast  Result Date: 03/04/2023 CLINICAL DATA:  Dyspnea with chest wall or pleural disease suspected. Hemoptysis. EXAM: CT ANGIOGRAPHY CHEST WITH CONTRAST TECHNIQUE: Multidetector CT imaging of the chest was performed using the standard protocol during bolus administration of intravenous contrast. Multiplanar CT image reconstructions and MIPs were obtained to evaluate the vascular anatomy. RADIATION DOSE REDUCTION: This exam was performed according to the departmental dose-optimization program which includes automated exposure control, adjustment of the mA and/or kV according to patient size and/or use of iterative reconstruction technique. CONTRAST:  72mL OMNIPAQUE IOHEXOL 350 MG/ML SOLN COMPARISON:  12/01/2022 FINDINGS: Cardiovascular: The heart is enlarged. No substantial pericardial effusion. Coronary artery calcification is evident. Mild atherosclerotic calcification is noted in the wall of the thoracic aorta. Enlargement of the pulmonary outflow tract/main pulmonary arteries suggests pulmonary arterial hypertension. There is no filling defect within the opacified pulmonary arteries to suggest the presence of an acute pulmonary embolus. While no definite filling defect is seen in segmental or subsegmental pulmonary arteries to the lower lobes, assessment is substantially degraded by breathing motion. Mediastinum/Nodes: No mediastinal lymphadenopathy. There is no hilar lymphadenopathy. The esophagus has normal imaging features. There is no axillary lymphadenopathy. Lungs/Pleura: Fine architectural  detail the lungs obscured by motion artifact. There is left base collapse/consolidation with small left pleural effusion. Mosaic ground-glass attenuation in the upper lungs is nonspecific but can be seen in the setting of small airways disease/air trapping. No suspicious pulmonary nodule or mass. Upper Abdomen: Nodular liver contour is compatible with cirrhosis. Small to moderate volume ascites noted around the liver with edema in the omentum and visualized upper abdominal mesentery. Lymphadenopathy in the porta hepatis may be secondary to the liver disease. Musculoskeletal: No worrisome lytic or sclerotic osseous abnormality. Bilateral gynecomastia evident. Mild body wall edema noted lower chest and visualized upper abdomen. Review of the MIP images confirms the above findings. IMPRESSION: 1. No CT evidence for acute pulmonary embolus. While no definite filling defect is seen in segmental or subsegmental pulmonary arteries to the lower lobes, assessment is substantially degraded by breathing motion. 2. Enlargement of the pulmonary outflow tract/main pulmonary arteries suggests pulmonary arterial hypertension. 3. Left base collapse/consolidation with small left pleural effusion. 4. Cirrhotic liver morphology with small to moderate volume ascites around the liver with edema in the omentum and visualized upper abdominal mesentery. 5. Lymphadenopathy in the porta hepatis may be secondary to the liver disease. 6. Mild body wall edema.  Bilateral gynecomastia. 7.  Aortic Atherosclerosis (ICD10-I70.0). Electronically Signed   By: Misty Stanley M.D.   On: 03/04/2023 05:59   VAS [place] LOWER EXTREMITY VENOUS (DVT)  Result Date: 02/20/2023  Lower Venous DVT Study Patient Name:  MAASAI TURTURRO  Date of Exam:   02/20/2023 Medical Rec #: [id number]        Accession #:    [id number] Date of Birth: 02-01-1966         Patient Gender: M Patient Age:   48 years Exam Location:  [facility] Procedure:      VAS [place] LOWER EXTREMITY  VENOUS (DVT)  Referring Phys: Karie Kirks --------------------------------------------------------------------------------  Indications: Swelling.  Risk Factors: DVT. Anticoagulation: Eliquis. Limitations: Poor ultrasound/tissue interface. Comparison Study: 02/19/2023 - Left upper extremtiy venous duplex                   Right:                   No evidence of thrombosis in the subclavian.                    Left:                   Findings consistent with acute deep vein thrombosis involving                   the left brachial veins. Findings consistent with acute                   superficial vein thrombosis involving the left basilic vein. Performing Technologist: Oliver Hum RVT  Examination Guidelines: A complete evaluation includes B-mode imaging, spectral Doppler, color Doppler, and power Doppler as needed of all accessible portions of each vessel. Bilateral testing is considered an integral part of a complete examination. Limited examinations for reoccurring indications may be performed as noted. The reflux portion of the exam is performed with the patient in reverse Trendelenburg.  +---------+---------------+---------+-----------+----------+--------------+ RIGHT    CompressibilityPhasicitySpontaneityPropertiesThrombus Aging +---------+---------------+---------+-----------+----------+--------------+ CFV      Full           Yes      Yes                                 +---------+---------------+---------+-----------+----------+--------------+ SFJ      Full                                                        +---------+---------------+---------+-----------+----------+--------------+ FV Prox  Full                                                        +---------+---------------+---------+-----------+----------+--------------+ FV Mid   Full                                                        +---------+---------------+---------+-----------+----------+--------------+ FV  DistalFull                                                        +---------+---------------+---------+-----------+----------+--------------+ PFV      Full                                                        +---------+---------------+---------+-----------+----------+--------------+  POP      Full           Yes      Yes                                 +---------+---------------+---------+-----------+----------+--------------+ PTV      Full                                                        +---------+---------------+---------+-----------+----------+--------------+ PERO     Full                                                        +---------+---------------+---------+-----------+----------+--------------+   +----+---------------+---------+-----------+----------+--------------+ LEFTCompressibilityPhasicitySpontaneityPropertiesThrombus Aging +----+---------------+---------+-----------+----------+--------------+ CFV Full           Yes      Yes                                 +----+---------------+---------+-----------+----------+--------------+     Summary: RIGHT: - There is no evidence of deep vein thrombosis in the lower extremity.  - No cystic structure found in the popliteal fossa.  LEFT: - No evidence of common femoral vein obstruction.  *See table(s) above for measurements and observations. Electronically signed by Servando Snare MD on 02/20/2023 at 4:54:00 PM.    Final    VAS [place] UPPER EXTREMITY VENOUS DUPLEX  Result Date: 02/19/2023 UPPER VENOUS STUDY  Patient Name:  SHERWOOD STENNIS  Date of Exam:   02/19/2023 Medical Rec #: [id number]        Accession #:    [id number] Date of Birth: [birth year]         Patient Gender: M Patient Age:   68 years Exam Location:  [facility] Procedure:      VAS [place] UPPER EXTREMITY VENOUS DUPLEX Referring Phys: AMRIT ADHIKARI --------------------------------------------------------------------------------  Indications:  Asymmetrical left arm swelling x 2 days Other Indications: Upper arm IV. Comparison Study: No prior left upper extremity studies. Prior right upper                   extremity venous study performed on 01-23-2023 was negative                   for DVT. Performing Technologist: Darlin Coco RDMS, RVT  Examination Guidelines: A complete evaluation includes B-mode imaging, spectral Doppler, color Doppler, and power Doppler as needed of all accessible portions of each vessel. Bilateral testing is considered an integral part of a complete examination. Limited examinations for reoccurring indications may be performed as noted.  Right Findings: +----------+------------+---------+-----------+----------+-------+ RIGHT     CompressiblePhasicitySpontaneousPropertiesSummary +----------+------------+---------+-----------+----------+-------+ Subclavian               Yes       Yes                      +----------+------------+---------+-----------+----------+-------+  Left Findings: +----------+------------+---------+-----------+----------+-------+ LEFT      CompressiblePhasicitySpontaneousPropertiesSummary +----------+------------+---------+-----------+----------+-------+ IJV           Full  Yes       Yes                      +----------+------------+---------+-----------+----------+-------+ Subclavian               Yes       Yes                      +----------+------------+---------+-----------+----------+-------+ Axillary      Full       Yes       Yes                      +----------+------------+---------+-----------+----------+-------+ Brachial    Partial      Yes       Yes               Acute  +----------+------------+---------+-----------+----------+-------+ Radial        Full                                          +----------+------------+---------+-----------+----------+-------+ Ulnar         Full                                           +----------+------------+---------+-----------+----------+-------+ Cephalic      Full                                          +----------+------------+---------+-----------+----------+-------+ Basilic     Partial      Yes       Yes               Acute  +----------+------------+---------+-----------+----------+-------+  Summary:  Right: No evidence of thrombosis in the subclavian.  Left: Findings consistent with acute deep vein thrombosis involving the left brachial veins. Findings consistent with acute superficial vein thrombosis involving the left basilic vein.  *See table(s) above for measurements and observations.  Diagnosing physician: Orlie Pollen Electronically signed by Orlie Pollen on 02/19/2023 at 5:37:26 PM.    Final    MR FINGERS LEFT W WO CONTRAST  Result Date: 02/03/2023 CLINICAL DATA:  Distal left ring finger infection after drill bit injury around Christmas. EXAM: MRI OF THE LEFT FINGERS WITHOUT AND WITH CONTRAST TECHNIQUE: Multiplanar, multisequence MR imaging of the left hand was performed before and after the administration of intravenous contrast. CONTRAST:  7.50mL GADAVIST GADOBUTROL 1 MMOL/ML IV SOLN COMPARISON:  Left ring finger x-rays from yesterday. FINDINGS: Bones/Joint/Cartilage No marrow signal abnormality. No fracture or dislocation. Joint spaces are preserved. No joint effusion. Ligaments Collateral ligaments are intact. Muscles and Tendons Flexor and extensor tendons are intact. No tenosynovitis. Soft tissue Dorsal distal ring finger soft tissue ulceration around the nail bed. No fluid collection. No soft tissue mass. IMPRESSION: 1. Dorsal distal ring finger soft tissue ulceration around the nail bed. No evidence of osteomyelitis or abscess. Electronically Signed   By: Titus Dubin M.D.   On: 02/03/2023 11:40   DG Finger Ring Left  Result Date: 02/02/2023 CLINICAL DATA:  Wound check.  Abscess. EXAM: LEFT RING FINGER 2+V COMPARISON:  Left hand radiographs  09/22/2022 FINDINGS: There is overlying bandage material  that limits evaluation of fine bony detail. There is again irregularity of the distal predominantly lateral/radial soft tissues of the fourth finger indicating a chronic wound. There is moderate soft tissue swelling. No definite cortical erosion is seen. 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 No definite cortical erosion to suggest osteomyelitis. Electronically Signed   By: Yvonne Kendall M.D.   On: 02/02/2023 11:53    Radiology CT Angio Chest PE W and/or Wo Contrast  Result Date: 03/04/2023 CLINICAL DATA:  Dyspnea with chest wall or pleural disease suspected. Hemoptysis. EXAM: CT ANGIOGRAPHY CHEST WITH CONTRAST TECHNIQUE: Multidetector CT imaging of the chest was performed using the standard protocol during bolus administration of intravenous contrast. Multiplanar CT image reconstructions and MIPs were obtained to evaluate the vascular anatomy. RADIATION DOSE REDUCTION: This exam was performed according to the departmental dose-optimization program which includes automated exposure control, adjustment of the mA and/or kV according to patient size and/or use of iterative reconstruction technique. CONTRAST:  19mL OMNIPAQUE IOHEXOL 350 MG/ML SOLN COMPARISON:  12/01/2022 FINDINGS: Cardiovascular: The heart is enlarged. No substantial pericardial effusion. Coronary artery calcification is evident. Mild atherosclerotic calcification is noted in the wall of the thoracic aorta. Enlargement of the pulmonary outflow tract/main pulmonary arteries suggests pulmonary arterial hypertension. There is no filling defect within the opacified pulmonary arteries to suggest the presence of an acute pulmonary embolus. While no definite filling defect is seen in segmental or subsegmental pulmonary arteries to the lower lobes, assessment is substantially degraded by breathing motion. Mediastinum/Nodes: No mediastinal  lymphadenopathy. There is no hilar lymphadenopathy. The esophagus has normal imaging features. There is no axillary lymphadenopathy. Lungs/Pleura: Fine architectural detail the lungs obscured by motion artifact. There is left base collapse/consolidation with small left pleural effusion. Mosaic ground-glass attenuation in the upper lungs is nonspecific but can be seen in the setting of small airways disease/air trapping. No suspicious pulmonary nodule or mass. Upper Abdomen: Nodular liver contour is compatible with cirrhosis. Small to moderate volume ascites noted around the liver with edema in the omentum and visualized upper abdominal mesentery. Lymphadenopathy in the porta hepatis may be secondary to the liver disease. Musculoskeletal: No worrisome lytic or sclerotic osseous abnormality. Bilateral gynecomastia evident. Mild body wall edema noted lower chest and visualized upper abdomen. Review of the MIP images confirms the above findings. IMPRESSION: 1. No CT evidence for acute pulmonary embolus. While no definite filling defect is seen in segmental or subsegmental pulmonary arteries to the lower lobes, assessment is substantially degraded by breathing motion. 2. Enlargement of the pulmonary outflow tract/main pulmonary arteries suggests pulmonary arterial hypertension. 3. Left base collapse/consolidation with small left pleural effusion. 4. Cirrhotic liver morphology with small to moderate volume ascites around the liver with edema in the omentum and visualized upper abdominal mesentery. 5. Lymphadenopathy in the porta hepatis may be secondary to the liver disease. 6. Mild body wall edema.  Bilateral gynecomastia. 7.  Aortic Atherosclerosis (ICD10-I70.0). Electronically Signed   By: Misty Stanley M.D.   On: 03/04/2023 05:59    Procedures Procedures    Medications Ordered in ED Medications  sodium chloride (PF) 0.9 % injection (has no administration in time range)  iohexol (OMNIPAQUE) 350 MG/ML injection  75 mL (75 mLs Intravenous Contrast Given 03/04/23 0525)    ED Course/ Medical Decision Making/ A&P  Medical Decision Making Hemoptysis on Lovenox for UE DVT   Amount and/or Complexity of Data Reviewed Independent Historian: EMS    Details: See above  External Data Reviewed: labs and notes.    Details: Previous admission reviewed  Labs: ordered.    Details: All labs reviewed: normal white count 9.2, anemia 10.5, platelet count low 122.  Normal sodium 135, normal potassium 3.5,  normal creatinine .77 elevated INR 1.8  Radiology: ordered.    Details: Pleural effusion by me on CTA Discussion of management or test interpretation with external provider(s): Case d/w Dr. Halford Chessman of PCCM, admit to medicine pulmonary will consult please admit to medicine   Risk Prescription drug management. Decision regarding hospitalization. Risk Details: Will need to be readmitted to medicine     Final Clinical Impression(s) / ED Diagnoses Final diagnoses:  Hemoptysis  Anasarca  Pleural effusion   The patient appears reasonably stabilized for admission considering the current resources, flow, and capabilities available in the ED at this time, and I doubt any other [facility] requiring further screening and/or treatment in the ED prior to admission.  Rx / DC Orders ED Discharge Orders     None         Ankur Snowdon, MD 03/04/23 [phone]

## 2023-03-05 DIAGNOSIS — Z86718 Personal history of other venous thrombosis and embolism: Secondary | ICD-10-CM | POA: Diagnosis not present

## 2023-03-05 DIAGNOSIS — D6959 Other secondary thrombocytopenia: Secondary | ICD-10-CM | POA: Diagnosis present

## 2023-03-05 DIAGNOSIS — R601 Generalized edema: Secondary | ICD-10-CM | POA: Diagnosis present

## 2023-03-05 DIAGNOSIS — G8929 Other chronic pain: Secondary | ICD-10-CM | POA: Diagnosis present

## 2023-03-05 DIAGNOSIS — R042 Hemoptysis: Secondary | ICD-10-CM | POA: Diagnosis not present

## 2023-03-05 DIAGNOSIS — D684 Acquired coagulation factor deficiency: Secondary | ICD-10-CM | POA: Diagnosis present

## 2023-03-05 DIAGNOSIS — I82409 Acute embolism and thrombosis of unspecified deep veins of unspecified lower extremity: Secondary | ICD-10-CM | POA: Diagnosis not present

## 2023-03-05 DIAGNOSIS — K7040 Alcoholic hepatic failure without coma: Secondary | ICD-10-CM | POA: Diagnosis present

## 2023-03-05 DIAGNOSIS — I10 Essential (primary) hypertension: Secondary | ICD-10-CM | POA: Diagnosis not present

## 2023-03-05 DIAGNOSIS — L02512 Cutaneous abscess of left hand: Secondary | ICD-10-CM | POA: Diagnosis present

## 2023-03-05 DIAGNOSIS — L03114 Cellulitis of left upper limb: Secondary | ICD-10-CM | POA: Diagnosis present

## 2023-03-05 DIAGNOSIS — K068 Other specified disorders of gingiva and edentulous alveolar ridge: Secondary | ICD-10-CM | POA: Diagnosis present

## 2023-03-05 DIAGNOSIS — E876 Hypokalemia: Secondary | ICD-10-CM | POA: Diagnosis present

## 2023-03-05 DIAGNOSIS — D649 Anemia, unspecified: Secondary | ICD-10-CM | POA: Diagnosis present

## 2023-03-05 DIAGNOSIS — F332 Major depressive disorder, recurrent severe without psychotic features: Secondary | ICD-10-CM | POA: Diagnosis present

## 2023-03-05 DIAGNOSIS — Z9151 Personal history of suicidal behavior: Secondary | ICD-10-CM | POA: Diagnosis not present

## 2023-03-05 DIAGNOSIS — K7031 Alcoholic cirrhosis of liver with ascites: Secondary | ICD-10-CM | POA: Diagnosis present

## 2023-03-05 DIAGNOSIS — M25571 Pain in right ankle and joints of right foot: Secondary | ICD-10-CM | POA: Diagnosis present

## 2023-03-05 DIAGNOSIS — Z9081 Acquired absence of spleen: Secondary | ICD-10-CM | POA: Diagnosis not present

## 2023-03-05 DIAGNOSIS — F1010 Alcohol abuse, uncomplicated: Secondary | ICD-10-CM | POA: Diagnosis present

## 2023-03-05 DIAGNOSIS — L98499 Non-pressure chronic ulcer of skin of other sites with unspecified severity: Secondary | ICD-10-CM | POA: Diagnosis present

## 2023-03-05 DIAGNOSIS — K766 Portal hypertension: Secondary | ICD-10-CM | POA: Diagnosis present

## 2023-03-05 DIAGNOSIS — Z8249 Family history of ischemic heart disease and other diseases of the circulatory system: Secondary | ICD-10-CM | POA: Diagnosis not present

## 2023-03-05 DIAGNOSIS — I81 Portal vein thrombosis: Secondary | ICD-10-CM | POA: Diagnosis present

## 2023-03-05 DIAGNOSIS — F17210 Nicotine dependence, cigarettes, uncomplicated: Secondary | ICD-10-CM | POA: Diagnosis present

## 2023-03-05 DIAGNOSIS — F411 Generalized anxiety disorder: Secondary | ICD-10-CM | POA: Diagnosis present

## 2023-03-05 DIAGNOSIS — R45851 Suicidal ideations: Secondary | ICD-10-CM | POA: Diagnosis present

## 2023-03-05 DIAGNOSIS — Z8673 Personal history of transient ischemic attack (TIA), and cerebral infarction without residual deficits: Secondary | ICD-10-CM | POA: Diagnosis not present

## 2023-03-05 DIAGNOSIS — F4310 Post-traumatic stress disorder, unspecified: Secondary | ICD-10-CM | POA: Diagnosis present

## 2023-03-05 LAB — CBC
HCT: 28.1 % — ABNORMAL LOW (ref 39.0–52.0)
Hemoglobin: 9.5 g/dL — ABNORMAL LOW (ref 13.0–17.0)
MCH: 37 pg — ABNORMAL HIGH (ref 26.0–34.0)
MCHC: 33.8 g/dL (ref 30.0–36.0)
MCV: 109.3 fL — ABNORMAL HIGH (ref 80.0–100.0)
Platelets: 115 10*3/uL — ABNORMAL LOW (ref 150–400)
RBC: 2.57 MIL/uL — ABNORMAL LOW (ref 4.22–5.81)
RDW: 16.9 % — ABNORMAL HIGH (ref 11.5–15.5)
WBC: 6.8 10*3/uL (ref 4.0–10.5)
nRBC: 0 % (ref 0.0–0.2)

## 2023-03-05 LAB — BASIC METABOLIC PANEL
Anion gap: 5 (ref 5–15)
BUN: 9 mg/dL (ref 6–20)
CO2: 26 mmol/L (ref 22–32)
Calcium: 8.3 mg/dL — ABNORMAL LOW (ref 8.9–10.3)
Chloride: 103 mmol/L (ref 98–111)
Creatinine, Ser: 0.87 mg/dL (ref 0.61–1.24)
GFR, Estimated: >60 mL/min (ref >60)
Glucose, Bld: 87 mg/dL (ref 70–99)
Potassium: 3.4 mmol/L — ABNORMAL LOW (ref 3.5–5.1)
Sodium: 134 mmol/L — ABNORMAL LOW (ref 135–145)

## 2023-03-05 LAB — HIV ANTIBODY (ROUTINE TESTING W REFLEX): HIV Screen 4th Generation wRfx: NONREACTIVE

## 2023-03-05 MED ORDER — POTASSIUM CHLORIDE 10 MEQ/100ML IV SOLN
10.0000 meq | INTRAVENOUS | Status: DC
  Administered 2023-03-05: 10 meq via INTRAVENOUS
  Filled 2023-03-05: qty 100

## 2023-03-05 MED ORDER — POTASSIUM CHLORIDE CRYS ER 20 MEQ PO TBCR
30.0000 meq | EXTENDED_RELEASE_TABLET | Freq: Once | ORAL | Status: AC
  Administered 2023-03-05: 30 meq via ORAL
  Filled 2023-03-05: qty 1

## 2023-03-05 NOTE — Progress Notes (Signed)
    Patient Name: Nathaniel Warren           DOB: 04-Oct-1966  MRN: [id number]      Admission Date: 03/04/2023  Attending Provider: Nita Sells, MD  Primary Diagnosis: Hemoptysis   Level of care: Telemetry    CROSS COVER NOTE   Date of Service   03/05/2023   Nathaniel Warren, 57 y.o. male, was admitted on 03/04/2023 for Hemoptysis.    HPI/Events of Note   Patient unable to tolerate IV potassium.  Recent potassium 3.4.  He is able to tolerate oral intake and oral medication.  Will change IV potassium to oral.   Interventions/ Plan   Oral potassium        Raenette Rover, DNP, [place]

## 2023-03-05 NOTE — Progress Notes (Signed)
PROGRESS NOTE   Nathaniel Warren  [id number] DOB: 1966/01/04 DOA: 03/04/2023 PCP: [name], NP  Brief Narrative:   Nathaniel Warren is a 57 year old Caucasian male with past medical history of alcohol abuse, cirrhosis, depression, chronic right ankle pain on oxycodone, prior suicide attempt due to PTSD who presented to the hospital with complaints of hemoptysis.   Previously, was discharged 03/02/2023 after an admission for necrosis of the left fourth fingertip with I&D and left hand abscess I&D in addition to findings of a hematoma in the left groin. He also expressed suicidal ideation during this time. During this time, the patient was found to have a DVT in the right upper extremity on 02/19/23.  He was put on Eliquis for this and was discharged on Lovenox after switching to this due to liver failure.  He reports that he had been taking his Lovenox appropriately but awoke soaked in bright red blood that he felt he was coughing up.  This prompted an ER visit.  It appeared that he had bleeding from the left upper molar to cause this.  In the emergency department, vital signs were normal.  Lab work showed stable anemia with hemoglobin of 10.5, nml WBC and platelets slightly low at 122. CMP unremarkable from his prior baseline, with normal renal function. INR is 1.8.   03/04/22: Bleeding stopped, hold DVT AC at this time  Hospital-Problem based course Hemoptysis  Hgb remains stable and above transfusion threshold. Bleeding has stopped, hold Lovenox at this time. Seems appropriate to restart once able to ensure bleeding is controlled. However, he will need dental assistance in the future and may continue to have bleeding episodes. --Monitor bleeding on regular diet  --Hold lovenox  --SCDs for ppx  --AM CBC --If recurs, may need oral surgery assistance    Acute deep vein thrombosis (DVT) of brachial vein of left upper extremity  Was on Lovenox before current bleed, now holding until  stabilized.   Alcoholic cirrhosis of liver with ascites, coagulopathy Portal hypertension Childs-Pugh C, MELD 25. No longer drinks alcohol, no evidence of withdrawal on exam. Hgb and platelet count stable.  -Continue Lasix, Aldactone   MDD (major depressive disorder), recurrent severe, without psychosis Required IVC on last admission with SI. No evidence of such during this admission. Feels like he wants to live longer to be with his grandchildren.  -Continue Paxil, Seroquel  Right/left hand hematoma, necrotic left fourth fingertip  S/p I&D. Covered in dressing, clean and dry. No evidence of infectious symptoms  - Wound dressing changes   Chronic Ankle Pain -Home oxycodone   DVT prophylaxis:  Code Status:  Family Communication:  Disposition:  Status is: Observation The patient remains OBS appropriate and will d/c before 2 midnights.   Consultants:  None   Procedures: None   Antimicrobials: None     Subjective: Patient reports that he is better since the bleeding stopped, he feels it stopped after 0500. He notes that he has always had problems with his teeth. He has no abdominal pain, chest pain, dyspnea. Notes that he wants to so what it takes to live longer.   Objective: Vitals:   03/04/23 2126 03/05/23 0107 03/05/23 0446 03/05/23 1323  BP: (!) 100/59 106/83 (!) 97/55 (!) 102/57  Pulse: 60 63 (!) 55 (!) 58  Resp: 14 17 18 18   Temp: 98.3 F (36.8 C) 97.7 F (36.5 C) 97.8 F (36.6 C) 98.2 F (36.8 C)  TempSrc: Oral Oral Oral Oral  SpO2: 97% 98%  93% 98%  Weight:      Height:        Intake/Output Summary (Last 24 hours) at 03/05/2023 1414 Last data filed at 03/05/2023 1223 Gross per 24 hour  Intake 828 ml  Output 1250 ml  Net -422 ml   Filed Weights   03/04/23 0415  Weight: 77.7 kg    Examination:  General: Alert and oriented in no apparent distress; caucasian M HEENT: Left upper molar with blood dried around the gum, multiple dental caries Heart:  Regular rate and rhythm with no murmurs appreciated Lungs: CTA bilaterally, no wheezing Abdomen: Bowel sounds present, no abdominal pain Skin: Warm and dry, pale mucosa, scleral icterus  Extremities: No lower extremity edema    Data Reviewed: personally reviewed   CBC    Component Value Date/Time   WBC 6.8 03/05/2023 0549   RBC 2.57 (L) 03/05/2023 0549   HGB 9.5 (L) 03/05/2023 0549   HGB 12.0 (L) 12/18/2022 1222   HCT 28.1 (L) 03/05/2023 0549   HCT 31.9 (L) 12/18/2022 1222   PLT 115 (L) 03/05/2023 0549   PLT 115 (L) 12/18/2022 1222   MCV 109.3 (H) 03/05/2023 0549   MCV 96 12/18/2022 1222   MCH 37.0 (H) 03/05/2023 0549   MCHC 33.8 03/05/2023 0549   RDW 16.9 (H) 03/05/2023 0549   RDW 21.7 (H) 12/18/2022 1222   LYMPHSABS 3.2 03/04/2023 0430   MONOABS 1.3 (H) 03/04/2023 0430   EOSABS 0.8 (H) 03/04/2023 0430   BASOSABS 0.1 03/04/2023 0430      Latest Ref Rng & Units 03/05/2023    5:49 AM 03/04/2023    4:30 AM 02/28/2023    4:05 AM  CMP  Glucose 70 - 99 mg/dL 87  109  93   BUN 6 - 20 mg/dL 9  10  15    Creatinine 0.61 - 1.24 mg/dL 0.87  0.77  0.84   Sodium 135 - 145 mmol/L 134  135  135   Potassium 3.5 - 5.1 mmol/L 3.4  3.5  4.0   Chloride 98 - 111 mmol/L 103  102  104   CO2 22 - 32 mmol/L 26  25  26    Calcium 8.9 - 10.3 mg/dL 8.3  8.7  8.2   Total Protein 6.5 - 8.1 g/dL  6.8  5.8   Total Bilirubin 0.3 - 1.2 mg/dL  5.9  4.9   Alkaline Phos 38 - 126 U/L  159  126   AST 15 - 41 U/L  67  63   ALT 0 - 44 U/L  23  21      Radiology Studies: CT Angio Chest PE W and/or Wo Contrast  Result Date: 03/04/2023 CLINICAL DATA:  Dyspnea with chest wall or pleural disease suspected. Hemoptysis. EXAM: CT ANGIOGRAPHY CHEST WITH CONTRAST TECHNIQUE: Multidetector CT imaging of the chest was performed using the standard protocol during bolus administration of intravenous contrast. Multiplanar CT image reconstructions and MIPs were obtained to evaluate the vascular anatomy. RADIATION DOSE  REDUCTION: This exam was performed according to the departmental dose-optimization program which includes automated exposure control, adjustment of the mA and/or kV according to patient size and/or use of iterative reconstruction technique. CONTRAST:  32mL OMNIPAQUE IOHEXOL 350 MG/ML SOLN COMPARISON:  12/01/2022 FINDINGS: Cardiovascular: The heart is enlarged. No substantial pericardial effusion. Coronary artery calcification is evident. Mild atherosclerotic calcification is noted in the wall of the thoracic aorta. Enlargement of the pulmonary outflow tract/main pulmonary arteries suggests pulmonary arterial hypertension. There is no  filling defect within the opacified pulmonary arteries to suggest the presence of an acute pulmonary embolus. While no definite filling defect is seen in segmental or subsegmental pulmonary arteries to the lower lobes, assessment is substantially degraded by breathing motion. Mediastinum/Nodes: No mediastinal lymphadenopathy. There is no hilar lymphadenopathy. The esophagus has normal imaging features. There is no axillary lymphadenopathy. Lungs/Pleura: Fine architectural detail the lungs obscured by motion artifact. There is left base collapse/consolidation with small left pleural effusion. Mosaic ground-glass attenuation in the upper lungs is nonspecific but can be seen in the setting of small airways disease/air trapping. No suspicious pulmonary nodule or mass. Upper Abdomen: Nodular liver contour is compatible with cirrhosis. Small to moderate volume ascites noted around the liver with edema in the omentum and visualized upper abdominal mesentery. Lymphadenopathy in the porta hepatis may be secondary to the liver disease. Musculoskeletal: No worrisome lytic or sclerotic osseous abnormality. Bilateral gynecomastia evident. Mild body wall edema noted lower chest and visualized upper abdomen. Review of the MIP images confirms the above findings. IMPRESSION: 1. No CT evidence for acute  pulmonary embolus. While no definite filling defect is seen in segmental or subsegmental pulmonary arteries to the lower lobes, assessment is substantially degraded by breathing motion. 2. Enlargement of the pulmonary outflow tract/main pulmonary arteries suggests pulmonary arterial hypertension. 3. Left base collapse/consolidation with small left pleural effusion. 4. Cirrhotic liver morphology with small to moderate volume ascites around the liver with edema in the omentum and visualized upper abdominal mesentery. 5. Lymphadenopathy in the porta hepatis may be secondary to the liver disease. 6. Mild body wall edema.  Bilateral gynecomastia. 7.  Aortic Atherosclerosis (ICD10-I70.0). Electronically Signed   By: Misty Stanley M.D.   On: 03/04/2023 05:59     Scheduled Meds:  docusate sodium  100 mg Oral BID   furosemide  40 mg Oral Daily   PARoxetine  20 mg Oral Daily   QUEtiapine  25 mg Oral QHS   spironolactone  50 mg Oral q AM   Continuous Infusions:   LOS: 0 days   Time spent: 30  Erskine Emery, MD [facility] 03/05/2023, 2:14 PM

## 2023-03-05 NOTE — Progress Notes (Signed)
  Transition of Care [facility]) Screening Note   Patient Details  Name: Nathaniel Warren Date of Birth: 1966-08-29   Transition of Care [facility]) CM/SW Contact:    Vassie Moselle, LCSW Phone Number: 03/05/2023, 2:32 PM    Transition of Care Department [facility]) has reviewed patient and no TOC needs have been identified at this time. We will continue to monitor patient advancement through interdisciplinary progression rounds. If new patient transition needs arise, please place a TOC consult.

## 2023-03-05 NOTE — Plan of Care (Signed)

## 2023-03-05 NOTE — Transitions of Care (Post Inpatient/ED Visit) (Signed)
   03/05/2023  Name: Nathaniel Warren MRN: [id number] DOB: March 22, 1966  Today's TOC FU Call Status: Today's TOC FU Call Status:: Unsuccessful Call (3rd Attempt) Unsuccessful Call (1st Attempt) Date: 03/03/23 Unsuccessful Call (2nd Attempt) Date: 03/04/23 Unsuccessful Call (3rd Attempt) Date: 03/05/23  Attempted to reach the patient regarding the most recent Inpatient/ED visit.  Follow Up Plan: No further outreach attempts will be made at this time. We have been unable to contact the patient.  Filley LPN [place] Advisor Direct Dial [phone]

## 2023-03-06 ENCOUNTER — Inpatient Hospital Stay (HOSPITAL_COMMUNITY): Payer: Medicaid Other

## 2023-03-06 DIAGNOSIS — I82409 Acute embolism and thrombosis of unspecified deep veins of unspecified lower extremity: Secondary | ICD-10-CM

## 2023-03-06 LAB — CBC
HCT: 28.3 % — ABNORMAL LOW (ref 39.0–52.0)
Hemoglobin: 9.6 g/dL — ABNORMAL LOW (ref 13.0–17.0)
MCH: 37.2 pg — ABNORMAL HIGH (ref 26.0–34.0)
MCHC: 33.9 g/dL (ref 30.0–36.0)
MCV: 109.7 fL — ABNORMAL HIGH (ref 80.0–100.0)
Platelets: 104 10*3/uL — ABNORMAL LOW (ref 150–400)
RBC: 2.58 MIL/uL — ABNORMAL LOW (ref 4.22–5.81)
RDW: 16.6 % — ABNORMAL HIGH (ref 11.5–15.5)
WBC: 8.3 10*3/uL (ref 4.0–10.5)
nRBC: 0 % (ref 0.0–0.2)

## 2023-03-06 LAB — COMPREHENSIVE METABOLIC PANEL
ALT: 20 U/L (ref 0–44)
AST: 52 U/L — ABNORMAL HIGH (ref 15–41)
Albumin: 2.2 g/dL — ABNORMAL LOW (ref 3.5–5.0)
Alkaline Phosphatase: 145 U/L — ABNORMAL HIGH (ref 38–126)
Anion gap: 7 (ref 5–15)
BUN: 11 mg/dL (ref 6–20)
CO2: 25 mmol/L (ref 22–32)
Calcium: 8.4 mg/dL — ABNORMAL LOW (ref 8.9–10.3)
Chloride: 102 mmol/L (ref 98–111)
Creatinine, Ser: 0.96 mg/dL (ref 0.61–1.24)
GFR, Estimated: >60 mL/min (ref >60)
Glucose, Bld: 109 mg/dL — ABNORMAL HIGH (ref 70–99)
Potassium: 4 mmol/L (ref 3.5–5.1)
Sodium: 134 mmol/L — ABNORMAL LOW (ref 135–145)
Total Bilirubin: 5 mg/dL — ABNORMAL HIGH (ref 0.3–1.2)
Total Protein: 6.1 g/dL — ABNORMAL LOW (ref 6.5–8.1)

## 2023-03-06 LAB — FUNGAL ORGANISM REFLEX

## 2023-03-06 LAB — PROTIME-INR
INR: 1.8 — ABNORMAL HIGH (ref 0.8–1.2)
Prothrombin Time: 20.3 seconds — ABNORMAL HIGH (ref 11.4–15.2)

## 2023-03-06 LAB — FUNGUS CULTURE WITH STAIN

## 2023-03-06 LAB — FUNGUS CULTURE RESULT

## 2023-03-06 MED ORDER — ENOXAPARIN SODIUM 80 MG/0.8ML IJ SOSY: 80.0000 mg | PREFILLED_SYRINGE | Freq: Two times a day (BID) | INTRAMUSCULAR | Status: DC

## 2023-03-06 NOTE — Progress Notes (Signed)
Mobility Specialist - Progress Note   03/06/23 1325  Mobility  Activity Ambulated with assistance in hallway  Level of Assistance Contact guard assist, steadying assist  Assistive Device Front wheel walker  Distance Ambulated (ft) 70 ft  Activity Response Tolerated well  Mobility Referral Yes  $Mobility charge 1 Mobility   Pt received in bed and agreed to mobility, R foot drop prevented further ambulation as well as pain in UE.   Pt returned to bed with all needs met.   Roderick Pee Mobility Specialist
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
## 2023-03-06 NOTE — Progress Notes (Signed)
ANTICOAGULATION CONSULT NOTE - Initial Consult  Pharmacy Consult for Lovenox Indication: DVT  No Known Allergies  Patient Measurements: Height: 5\' 9"  (175.3 cm) Weight: 77.7 kg (171 lb 4.8 oz) IBW/kg (Calculated) : 70.7  Vital Signs: Temp: 98.3 F (36.8 C) (03/29 0521) Temp Source: Oral (03/29 0521) BP: 96/64 (03/29 0521) Pulse Rate: 56 (03/29 0521)  Labs: Recent Labs    03/04/23 0430 03/04/23 2054 03/05/23 0549 03/06/23 0515  HGB 10.5* 9.6* 9.5* 9.6*  HCT 31.0* 28.0* 28.1* 28.3*  PLT 122* 117* 115* 104*  LABPROT 20.4*  --   --  20.3*  INR 1.8*  --   --  1.8*  CREATININE 0.77  --  0.87 0.96    Estimated Creatinine Clearance: 86.9 mL/min (by C-G formula based on SCr of 0.96 mg/dL).   Medical History: Past Medical History:  Diagnosis Date   Cirrhosis ([place])    Depression    Hernia, abdominal    Liver failure (H[facility])    Seizures (H[facility])    Stroke (H[facility])    Tobacco abuse     Medications:  Scheduled:   docusate sodium  100 mg Oral BID   furosemide  40 mg Oral Daily   PARoxetine  20 mg Oral Daily   QUEtiapine  25 mg Oral QHS   spironolactone  50 mg Oral q AM   Infusions:  PRN: albuterol, ondansetron **OR** ondansetron (ZOFRAN) IV, oxyCODONE-acetaminophen, polyethylene glycol, traMADol, traZODone  Assessment: 57 yo male diagnosed with RUE DVT on 02/19/23, started on Lovenox (no DOAC due to liver disease). Presented with hemoptysis, felt to be coming from left upper molar so Lovenox was held on admission. Pharmacy consulted to resume Lovenox today 3/29. Hgb low but stable, Plts chronically low. INR elevated at baseline given history of alcohols use/cirrhosis.  Goal of Therapy:  Anti-Xa level 0.6-1 units/ml 4hrs after LMWH dose given Monitor platelets by anticoagulation protocol: Yes   Plan:  Resume Lovenox 80mg  SQ q12h Monitor CBC, signs/symptoms of bleeding  Peggyann Juba, PharmD, BCPS Pharmacy: [phone] 03/06/2023,9:19 AM

## 2023-03-06 NOTE — Plan of Care (Signed)

## 2023-03-06 NOTE — Progress Notes (Signed)
Left upper extremity venous duplex has been completed. Preliminary results can be found in CV Proc through chart review.  Results were given to Dr. Verlon Au.  03/06/23 10:40 AM Carlos Levering RVT

## 2023-03-06 NOTE — Consult Note (Signed)
WOC Nurse Consult Note: Reason for Consult:left hand, 4th digit tip wound, full thickness Wound type:trauma Pressure Injury POA: N/A Wound bed:See photodocumentation, pink moist Drainage (amount, consistency, odor) small serous Periwound:intact Dressing procedure/placement/frequency:I have provided Nursing with daily care orders for topical care using a NS cleanse and dressing with an antimicrobial nonadherent, xeroform gauze topped with dry gauze and secured with conform bandaging or a strip silicone foam bandage. I have communicated with [facility] and recommend that if further recommendations are needed, to consider consultation with Orthopedics, specifically with Dr. Murrell Redden, who performed surgery on the patient on 02/03/23.  [place] nursing team will not follow, but will remain available to this patient, the nursing and medical teams.  Please re-consult if needed.  Thank you for inviting [place] to participate in this patient's Plan of Care.  Maudie Flakes, MSN, RN, CNS, [place], Serita Grammes, [facility], [employer] phone:  [phone] .
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
## 2023-03-07 DIAGNOSIS — I10 Essential (primary) hypertension: Secondary | ICD-10-CM

## 2023-03-07 LAB — COMPREHENSIVE METABOLIC PANEL
ALT: 20 U/L (ref 0–44)
AST: 53 U/L — ABNORMAL HIGH (ref 15–41)
Albumin: 2.3 g/dL — ABNORMAL LOW (ref 3.5–5.0)
Alkaline Phosphatase: 133 U/L — ABNORMAL HIGH (ref 38–126)
Anion gap: 7 (ref 5–15)
BUN: 11 mg/dL (ref 6–20)
CO2: 25 mmol/L (ref 22–32)
Calcium: 8.4 mg/dL — ABNORMAL LOW (ref 8.9–10.3)
Chloride: 105 mmol/L (ref 98–111)
Creatinine, Ser: 0.84 mg/dL (ref 0.61–1.24)
GFR, Estimated: >60 mL/min (ref >60)
Glucose, Bld: 92 mg/dL (ref 70–99)
Potassium: 4 mmol/L (ref 3.5–5.1)
Sodium: 137 mmol/L (ref 135–145)
Total Bilirubin: 4.7 mg/dL — ABNORMAL HIGH (ref 0.3–1.2)
Total Protein: 5.9 g/dL — ABNORMAL LOW (ref 6.5–8.1)

## 2023-03-07 LAB — CBC
HCT: 29 % — ABNORMAL LOW (ref 39.0–52.0)
Hemoglobin: 9.8 g/dL — ABNORMAL LOW (ref 13.0–17.0)
MCH: 36.8 pg — ABNORMAL HIGH (ref 26.0–34.0)
MCHC: 33.8 g/dL (ref 30.0–36.0)
MCV: 109 fL — ABNORMAL HIGH (ref 80.0–100.0)
Platelets: 100 10*3/uL — ABNORMAL LOW (ref 150–400)
RBC: 2.66 MIL/uL — ABNORMAL LOW (ref 4.22–5.81)
RDW: 16.2 % — ABNORMAL HIGH (ref 11.5–15.5)
WBC: 8.6 10*3/uL (ref 4.0–10.5)
nRBC: 0 % (ref 0.0–0.2)

## 2023-03-07 LAB — PROTIME-INR
INR: 2 — ABNORMAL HIGH (ref 0.8–1.2)
Prothrombin Time: 22.1 seconds — ABNORMAL HIGH (ref 11.4–15.2)

## 2023-03-07 MED ORDER — MIDODRINE HCL 5 MG PO TABS
5.0000 mg | ORAL_TABLET | Freq: Three times a day (TID) | ORAL | Status: DC
  Administered 2023-03-07 – 2023-03-09 (×7): 5 mg via ORAL
  Filled 2023-03-07 (×7): qty 1

## 2023-03-07 MED ORDER — SPIRONOLACTONE 12.5 MG HALF TABLET
12.5000 mg | ORAL_TABLET | Freq: Every morning | ORAL | Status: DC
  Administered 2023-03-08: 12.5 mg via ORAL
  Filled 2023-03-07: qty 1
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
## 2023-03-07 NOTE — Consult Note (Addendum)
Chief Complaint: Management of diuretics  Referring Provider:  Dr Verlon Au      ASSESSMENT AND PLAN;   EtOH liver cirrhosis with pHTN (stopped ETOH 07/2022) Ascites-appears to be well-controlled on Lasix/Aldactone. US-P Jaundice, thrombocytopenia, coagulopathy. No hepatic encephalopathy. Noncompliance H/O anxiety/depression/suicidal ideation/substance abuse-may not be a candidate for liver transplant.  Plan: -Low-salt diet -Resume Aldactone 50/Lasix 40 (home meds).  Would increase dose gradually.  Monitor renal function and electrolytes. -Follow results of [place]. If +, paracenteses.  Please send fluid for alb, TP, cell count and cytology. -Check AFP -Avoid Hepatotoxic medications.  Stop acetaminophen (as part of pain meds) -FU GI as outpt.  Needs EGD/colon as outpt -D/W pt in detail.    MELD 3.0: 22 at 03/07/2023  5:39 AM MELD-Na: 20 at 03/07/2023  5:39 AM Calculated from: Serum Creatinine: 0.84 mg/dL (Using min of 1 mg/dL) at 03/07/2023  5:39 AM Serum Sodium: 137 mmol/L at 03/07/2023  5:39 AM Total Bilirubin: 4.7 mg/dL at 03/07/2023  5:39 AM Serum Albumin: 2.3 g/dL at 03/07/2023  5:39 AM INR(ratio): 2.0 at 03/07/2023  5:39 AM Age at listing (hypothetical): 82 years Sex: Male at 03/07/2023  5:39 AM   HPI:    Nathaniel Warren is a 57 y.o. male  With ETOH liver cirrhosis, previous splenectomy, anxiety/depression/bipolar disorder, OA/chronic back pain on oxycodone, H/O suicidal ideation, polysubstance abuse, prior CVA and seizures, noncompliance.    Recently D/C 3/25 after adm for left hand abscess s/p I&D, left groin hematoma, suicidal ideation (seen by psych and cleared).  He was found to have DVT of L upper brachial vein 02/19/2023-started on Eliquis, then discharged on Lovenox.  Adm with hemoptysis- resolved after stopping Lovenox. Neg CTA chest. Rpt Doppler showed resolution of DVT.  Hence no AC  GI being consulted for management of diuretics.  He has been on home Aldactone/Lasix  50/40.  And has been doing well.  He did not require recent paracenteses.  Ultrasound has been ordered and is pending.  He was scheduled to have EGD/colonoscopy previously for which she did not show up.  He would like to get it performed as an outpt.  Besides, due to long weekend, we have only limited anesthesia availability for "elective" procedures.  Denies having any change in mental status. He does have history of easy bruisability. No fever chills or night sweats.    Past Medical History:  Diagnosis Date   Cirrhosis ([place])    Depression    Hernia, abdominal    Liver failure ([place])    Seizures ([place])    Stroke ([place])    Tobacco abuse     Past Surgical History:  Procedure Laterality Date   ANKLE SURGERY     ANKLE SURGERY Right    HERNIA REPAIR     INCISION AND DRAINAGE OF WOUND Left 02/03/2023   Procedure: IRRIGATION AND DEBRIDEMENT WOUND LEFT RING FINGER POSSIBLE AMPUTATION;  Surgeon: Orene Desanctis, MD;  Location: [facility];  Service: Orthopedics;  Laterality: Left;   SPLENECTOMY      Family History  Problem Relation Age of Onset   COPD Mother    Diabetes Father    Stroke Maternal Grandmother    Alzheimer's disease Maternal Grandfather    Cancer Paternal Grandmother    Stomach cancer Paternal Grandfather    Hypertension Other    Colon cancer Neg Hx    Esophageal cancer Neg Hx    Colon polyps Neg Hx    Inflammatory bowel disease Neg Hx  Liver disease Neg Hx    Pancreatic cancer Neg Hx    Rectal cancer Neg Hx     Social History   Tobacco Use   Smoking status: Every Day    Packs/day: 1.00    Years: 30.00    Additional pack years: 0.00    Total pack years: 30.00    Types: Cigarettes   Smokeless tobacco: Former  Scientific laboratory technician Use: Never used  Substance Use Topics   Alcohol use: Not Currently   Drug use: Yes    Types: Marijuana    Current Facility-Administered Medications  Medication Dose Route Frequency Provider Last Rate Last Admin   albuterol  (PROVENTIL) (2.5 MG/3ML) 0.083% nebulizer solution 2.5 mg  2.5 mg Nebulization Q2H PRN Hollice Gong, Mir M, MD       docusate sodium (COLACE) capsule 100 mg  100 mg Oral BID Hollice Gong, Mir M, MD   100 mg at 03/06/23 1059   midodrine (PROAMATINE) tablet 5 mg  5 mg Oral TID WC Nita Sells, MD   5 mg at 03/07/23 1204   ondansetron (ZOFRAN) tablet 4 mg  4 mg Oral Q6H PRN Hollice Gong, Mir M, MD   4 mg at 03/04/23 1346   Or   ondansetron (ZOFRAN) injection 4 mg  4 mg Intravenous Q6H PRN Hollice Gong, Mir M, MD       oxyCODONE-acetaminophen (PERCOCET/ROXICET) 5-325 MG per tablet 1-2 tablet  1-2 tablet Oral Q6H PRN Lucillie Garfinkel, MD   2 tablet at 03/07/23 [id number]   PARoxetine (PAXIL) tablet 20 mg  20 mg Oral Daily Hollice Gong, Mir M, MD   20 mg at 03/07/23 [id number]   polyethylene glycol (MIRALAX / GLYCOLAX) packet 17 g  17 g Oral Daily PRN Hollice Gong, Mir M, MD       QUEtiapine (SEROQUEL) tablet 25 mg  25 mg Oral QHS Hollice Gong, Mir M, MD   25 mg at 03/06/23 2308   [START ON 03/08/2023] spironolactone (ALDACTONE) tablet 12.5 mg  12.5 mg Oral q AM Nita Sells, MD       traMADol (ULTRAM) tablet 50 mg  50 mg Oral Q8H PRN Hollice Gong, Mir M, MD   50 mg at 03/06/23 1607   traZODone (DESYREL) tablet 25 mg  25 mg Oral QHS PRN Hollice Gong, Mir M, MD        No Known Allergies  Review of Systems:  neg     Physical Exam:    BP (!) 98/51 (BP Location: Left Arm)   Pulse 61   Temp 98.2 F (36.8 C) (Oral)   Resp 18   Ht 5\' 9"  (1.753 m)   Wt 78.6 kg   SpO2 96%   BMI 25.59 kg/m  Wt Readings from Last 3 Encounters:  03/07/23 78.6 kg  01/23/23 77.7 kg  01/06/23 76.5 kg   Constitutional:  Well-developed, in no acute distress. Psychiatric: Normal mood and affect. Behavior is normal. HEENT: Pupils normal.  Conjunctivae are normal. +scleral icterus. Cardiovascular: Normal rate, regular rhythm. No edema Pulmonary/chest: Effort normal and breath sounds normal. No wheezing, rales or  rhonchi. Abdominal: Soft, nondistended. Nontender. Bowel sounds active throughout. There are no masses palpable. No hepatomegaly.  Well-healed surgical scar previous splenectomy Rectal: Deferred Neurological: Alert and oriented to person place and time. Skin: Skin is warm and dry. No rashes noted.  Data Reviewed: I have personally reviewed following labs and imaging studies  CBC:    Latest Ref Rng & Units 03/07/2023    5:39 AM 03/06/2023  5:15 AM 03/05/2023    5:49 AM  CBC  WBC 4.0 - 10.5 K/uL 8.6  8.3  6.8   Hemoglobin 13.0 - 17.0 g/dL 9.8  9.6  9.5   Hematocrit 39.0 - 52.0 % 29.0  28.3  28.1   Platelets 150 - 400 K/uL 100  104  115     CMP:    Latest Ref Rng & Units 03/07/2023    5:39 AM 03/06/2023    5:15 AM 03/05/2023    5:49 AM  CMP  Glucose 70 - 99 mg/dL 92  109  87   BUN 6 - 20 mg/dL 11  11  9    Creatinine 0.61 - 1.24 mg/dL 0.84  0.96  0.87   Sodium 135 - 145 mmol/L 137  134  134   Potassium 3.5 - 5.1 mmol/L 4.0  4.0  3.4   Chloride 98 - 111 mmol/L 105  102  103   CO2 22 - 32 mmol/L 25  25  26    Calcium 8.9 - 10.3 mg/dL 8.4  8.4  8.3   Total Protein 6.5 - 8.1 g/dL 5.9  6.1    Total Bilirubin 0.3 - 1.2 mg/dL 4.7  5.0    Alkaline Phos 38 - 126 U/L 133  145    AST 15 - 41 U/L 53  52    ALT 0 - 44 U/L 20  20      GFR: Estimated Creatinine Clearance: 99.4 mL/min (by C-G formula based on SCr of 0.84 mg/dL). Liver Function Tests: Recent Labs  Lab 03/04/23 0430 03/06/23 0515 03/07/23 0539  AST 67* 52* 53*  ALT 23 20 20   ALKPHOS 159* 145* 133*  BILITOT 5.9* 5.0* 4.7*  PROT 6.8 6.1* 5.9*  ALBUMIN 2.5* 2.2* 2.3*   No results for input(s): "LIPASE", "AMYLASE" in the last 168 hours. No results for input(s): "AMMONIA" in the last 168 hours. Coagulation Profile: Recent Labs  Lab 03/04/23 0430 03/06/23 0515 03/07/23 0539  INR 1.8* 1.8* 2.0*      Radiology Studies: VAS [place] UPPER EXTREMITY VENOUS DUPLEX  Result Date: 03/06/2023 UPPER VENOUS STUDY  Patient  Name:  MCNEIL VALLO  Date of Exam:   03/06/2023 Medical Rec #: [id number]        Accession #:    [id number] Date of Birth: 1966-11-30         Patient Gender: M Patient Age:   57 years Exam Location:  [facility] Procedure:      VAS [place] UPPER EXTREMITY VENOUS DUPLEX Referring Phys: Nita Sells --------------------------------------------------------------------------------  Indications: DVt Risk Factors: DVT. Comparison Study: 02/19/2023 - Right:                   No evidence of thrombosis in the subclavian.                    Left:                   Findings consistent with acute deep vein thrombosis involving                   the left brachial veins. Findings consistent with acute                   superficial vein thrombosis involving the left basilic vein. Performing Technologist: Oliver Hum RVT  Examination Guidelines: A complete evaluation includes B-mode imaging, spectral Doppler, color Doppler, and power Doppler as needed of all  accessible portions of each vessel. Bilateral testing is considered an integral part of a complete examination. Limited examinations for reoccurring indications may be performed as noted.  Right Findings: +----------+------------+---------+-----------+----------+-------+ RIGHT     CompressiblePhasicitySpontaneousPropertiesSummary +----------+------------+---------+-----------+----------+-------+ Subclavian    Full       Yes       Yes                      +----------+------------+---------+-----------+----------+-------+  Left Findings: +----------+------------+---------+-----------+----------+-------+ LEFT      CompressiblePhasicitySpontaneousPropertiesSummary +----------+------------+---------+-----------+----------+-------+ IJV           Full       Yes       Yes                      +----------+------------+---------+-----------+----------+-------+ Subclavian    Full       Yes       Yes                       +----------+------------+---------+-----------+----------+-------+ Axillary      Full       Yes       Yes                      +----------+------------+---------+-----------+----------+-------+ Brachial      Full       Yes       Yes                      +----------+------------+---------+-----------+----------+-------+ Radial        Full                                          +----------+------------+---------+-----------+----------+-------+ Ulnar         Full                                          +----------+------------+---------+-----------+----------+-------+ Cephalic      Full                                          +----------+------------+---------+-----------+----------+-------+ Basilic       Full                                          +----------+------------+---------+-----------+----------+-------+  Summary:  Right: No evidence of thrombosis in the subclavian.  Left: No evidence of deep vein thrombosis in the upper extremity. No evidence of superficial vein thrombosis in the upper extremity.  *See table(s) above for measurements and observations.  Diagnosing physician: Harold Barban MD Electronically signed by Harold Barban MD on 03/06/2023 at 8:10:46 PM.    Final    CT Angio Chest PE W and/or Wo Contrast  Result Date: 03/04/2023 CLINICAL DATA:  Dyspnea with chest wall or pleural disease suspected. Hemoptysis. EXAM: CT ANGIOGRAPHY CHEST WITH CONTRAST TECHNIQUE: Multidetector CT imaging of the chest was performed using the standard protocol during bolus administration of intravenous contrast. Multiplanar CT image reconstructions and MIPs were obtained to evaluate the vascular anatomy. RADIATION DOSE REDUCTION: This exam  was performed according to the departmental dose-optimization program which includes automated exposure control, adjustment of the mA and/or kV according to patient size and/or use of iterative reconstruction technique. CONTRAST:  19mL  OMNIPAQUE IOHEXOL 350 MG/ML SOLN COMPARISON:  12/01/2022 FINDINGS: Cardiovascular: The heart is enlarged. No substantial pericardial effusion. Coronary artery calcification is evident. Mild atherosclerotic calcification is noted in the wall of the thoracic aorta. Enlargement of the pulmonary outflow tract/main pulmonary arteries suggests pulmonary arterial hypertension. There is no filling defect within the opacified pulmonary arteries to suggest the presence of an acute pulmonary embolus. While no definite filling defect is seen in segmental or subsegmental pulmonary arteries to the lower lobes, assessment is substantially degraded by breathing motion. Mediastinum/Nodes: No mediastinal lymphadenopathy. There is no hilar lymphadenopathy. The esophagus has normal imaging features. There is no axillary lymphadenopathy. Lungs/Pleura: Fine architectural detail the lungs obscured by motion artifact. There is left base collapse/consolidation with small left pleural effusion. Mosaic ground-glass attenuation in the upper lungs is nonspecific but can be seen in the setting of small airways disease/air trapping. No suspicious pulmonary nodule or mass. Upper Abdomen: Nodular liver contour is compatible with cirrhosis. Small to moderate volume ascites noted around the liver with edema in the omentum and visualized upper abdominal mesentery. Lymphadenopathy in the porta hepatis may be secondary to the liver disease. Musculoskeletal: No worrisome lytic or sclerotic osseous abnormality. Bilateral gynecomastia evident. Mild body wall edema noted lower chest and visualized upper abdomen. Review of the MIP images confirms the above findings. IMPRESSION: 1. No CT evidence for acute pulmonary embolus. While no definite filling defect is seen in segmental or subsegmental pulmonary arteries to the lower lobes, assessment is substantially degraded by breathing motion. 2. Enlargement of the pulmonary outflow tract/main pulmonary arteries  suggests pulmonary arterial hypertension. 3. Left base collapse/consolidation with small left pleural effusion. 4. Cirrhotic liver morphology with small to moderate volume ascites around the liver with edema in the omentum and visualized upper abdominal mesentery. 5. Lymphadenopathy in the porta hepatis may be secondary to the liver disease. 6. Mild body wall edema.  Bilateral gynecomastia. 7.  Aortic Atherosclerosis (ICD10-I70.0). Electronically Signed   By: Misty Stanley M.D.   On: 03/04/2023 05:59   VAS [place] LOWER EXTREMITY VENOUS (DVT)  Result Date: 02/20/2023  Lower Venous DVT Study Patient Name:  DUVAL WINCH  Date of Exam:   02/20/2023 Medical Rec #: [id number]        Accession #:    [id number] Date of Birth: 1966-07-03         Patient Gender: M Patient Age:   47 years Exam Location:  [facility] Procedure:      VAS [place] LOWER EXTREMITY VENOUS (DVT) Referring Phys: Karie Kirks --------------------------------------------------------------------------------  Indications: Swelling.  Risk Factors: DVT. Anticoagulation: Eliquis. Limitations: Poor ultrasound/tissue interface. Comparison Study: 02/19/2023 - Left upper extremtiy venous duplex                   Right:                   No evidence of thrombosis in the subclavian.                    Left:                   Findings consistent with acute deep vein thrombosis involving  the left brachial veins. Findings consistent with acute                   superficial vein thrombosis involving the left basilic vein. Performing Technologist: Oliver Hum RVT  Examination Guidelines: A complete evaluation includes B-mode imaging, spectral Doppler, color Doppler, and power Doppler as needed of all accessible portions of each vessel. Bilateral testing is considered an integral part of a complete examination. Limited examinations for reoccurring indications may be performed as noted. The reflux portion of the exam is performed with the patient in  reverse Trendelenburg.  +---------+---------------+---------+-----------+----------+--------------+ RIGHT    CompressibilityPhasicitySpontaneityPropertiesThrombus Aging +---------+---------------+---------+-----------+----------+--------------+ CFV      Full           Yes      Yes                                 +---------+---------------+---------+-----------+----------+--------------+ SFJ      Full                                                        +---------+---------------+---------+-----------+----------+--------------+ FV Prox  Full                                                        +---------+---------------+---------+-----------+----------+--------------+ FV Mid   Full                                                        +---------+---------------+---------+-----------+----------+--------------+ FV DistalFull                                                        +---------+---------------+---------+-----------+----------+--------------+ PFV      Full                                                        +---------+---------------+---------+-----------+----------+--------------+ POP      Full           Yes      Yes                                 +---------+---------------+---------+-----------+----------+--------------+ PTV      Full                                                        +---------+---------------+---------+-----------+----------+--------------+ PERO     Full                                                        +---------+---------------+---------+-----------+----------+--------------+   +----+---------------+---------+-----------+----------+--------------+  LEFTCompressibilityPhasicitySpontaneityPropertiesThrombus Aging +----+---------------+---------+-----------+----------+--------------+ CFV Full           Yes      Yes                                  +----+---------------+---------+-----------+----------+--------------+     Summary: RIGHT: - There is no evidence of deep vein thrombosis in the lower extremity.  - No cystic structure found in the popliteal fossa.  LEFT: - No evidence of common femoral vein obstruction.  *See table(s) above for measurements and observations. Electronically signed by Servando Snare MD on 02/20/2023 at 4:54:00 PM.    Final    VAS [place] UPPER EXTREMITY VENOUS DUPLEX  Result Date: 02/19/2023 UPPER VENOUS STUDY  Patient Name:  CHANSTON ROYAL  Date of Exam:   02/19/2023 Medical Rec #: [id number]        Accession #:    [id number] Date of Birth: 1966-09-26         Patient Gender: M Patient Age:   61 years Exam Location:  [facility] Procedure:      VAS [place] UPPER EXTREMITY VENOUS DUPLEX Referring Phys: AMRIT ADHIKARI --------------------------------------------------------------------------------  Indications: Asymmetrical left arm swelling x 2 days Other Indications: Upper arm IV. Comparison Study: No prior left upper extremity studies. Prior right upper                   extremity venous study performed on 01-23-2023 was negative                   for DVT. Performing Technologist: Darlin Coco RDMS, RVT  Examination Guidelines: A complete evaluation includes B-mode imaging, spectral Doppler, color Doppler, and power Doppler as needed of all accessible portions of each vessel. Bilateral testing is considered an integral part of a complete examination. Limited examinations for reoccurring indications may be performed as noted.  Right Findings: +----------+------------+---------+-----------+----------+-------+ RIGHT     CompressiblePhasicitySpontaneousPropertiesSummary +----------+------------+---------+-----------+----------+-------+ Subclavian               Yes       Yes                      +----------+------------+---------+-----------+----------+-------+  Left Findings:  +----------+------------+---------+-----------+----------+-------+ LEFT      CompressiblePhasicitySpontaneousPropertiesSummary +----------+------------+---------+-----------+----------+-------+ IJV           Full       Yes       Yes                      +----------+------------+---------+-----------+----------+-------+ Subclavian               Yes       Yes                      +----------+------------+---------+-----------+----------+-------+ Axillary      Full       Yes       Yes                      +----------+------------+---------+-----------+----------+-------+ Brachial    Partial      Yes       Yes               Acute  +----------+------------+---------+-----------+----------+-------+ Radial        Full                                          +----------+------------+---------+-----------+----------+-------+  Ulnar         Full                                          +----------+------------+---------+-----------+----------+-------+ Cephalic      Full                                          +----------+------------+---------+-----------+----------+-------+ Basilic     Partial      Yes       Yes               Acute  +----------+------------+---------+-----------+----------+-------+  Summary:  Right: No evidence of thrombosis in the subclavian.  Left: Findings consistent with acute deep vein thrombosis involving the left brachial veins. Findings consistent with acute superficial vein thrombosis involving the left basilic vein.  *See table(s) above for measurements and observations.  Diagnosing physician: Orlie Pollen Electronically signed by Orlie Pollen on 02/19/2023 at 5:37:26 PM.    Final       Carmell Austria, MD 03/07/2023, 1:28 PM  Cc: No ref. provider found

## 2023-03-08 ENCOUNTER — Inpatient Hospital Stay (HOSPITAL_COMMUNITY): Payer: Medicaid Other

## 2023-03-08 DIAGNOSIS — K7031 Alcoholic cirrhosis of liver with ascites: Secondary | ICD-10-CM

## 2023-03-08 DIAGNOSIS — I82409 Acute embolism and thrombosis of unspecified deep veins of unspecified lower extremity: Secondary | ICD-10-CM

## 2023-03-08 DIAGNOSIS — F411 Generalized anxiety disorder: Secondary | ICD-10-CM

## 2023-03-08 DIAGNOSIS — K766 Portal hypertension: Secondary | ICD-10-CM

## 2023-03-08 LAB — COMPREHENSIVE METABOLIC PANEL
ALT: 20 U/L (ref 0–44)
AST: 54 U/L — ABNORMAL HIGH (ref 15–41)
Albumin: 2.2 g/dL — ABNORMAL LOW (ref 3.5–5.0)
Alkaline Phosphatase: 144 U/L — ABNORMAL HIGH (ref 38–126)
Anion gap: 6 (ref 5–15)
BUN: 14 mg/dL (ref 6–20)
CO2: 26 mmol/L (ref 22–32)
Calcium: 8.4 mg/dL — ABNORMAL LOW (ref 8.9–10.3)
Chloride: 103 mmol/L (ref 98–111)
Creatinine, Ser: 0.9 mg/dL (ref 0.61–1.24)
GFR, Estimated: >60 mL/min (ref >60)
Glucose, Bld: 95 mg/dL (ref 70–99)
Potassium: 4.3 mmol/L (ref 3.5–5.1)
Sodium: 135 mmol/L (ref 135–145)
Total Bilirubin: 4.4 mg/dL — ABNORMAL HIGH (ref 0.3–1.2)
Total Protein: 6.1 g/dL — ABNORMAL LOW (ref 6.5–8.1)

## 2023-03-08 LAB — CBC
HCT: 29.7 % — ABNORMAL LOW (ref 39.0–52.0)
Hemoglobin: 10 g/dL — ABNORMAL LOW (ref 13.0–17.0)
MCH: 37.2 pg — ABNORMAL HIGH (ref 26.0–34.0)
MCHC: 33.7 g/dL (ref 30.0–36.0)
MCV: 110.4 fL — ABNORMAL HIGH (ref 80.0–100.0)
Platelets: 94 10*3/uL — ABNORMAL LOW (ref 150–400)
RBC: 2.69 MIL/uL — ABNORMAL LOW (ref 4.22–5.81)
RDW: 15.9 % — ABNORMAL HIGH (ref 11.5–15.5)
WBC: 9.4 10*3/uL (ref 4.0–10.5)
nRBC: 0 % (ref 0.0–0.2)

## 2023-03-08 LAB — PROTIME-INR
INR: 2 — ABNORMAL HIGH (ref 0.8–1.2)
Prothrombin Time: 22.6 seconds — ABNORMAL HIGH (ref 11.4–15.2)

## 2023-03-08 MED ORDER — CEPHALEXIN 500 MG PO CAPS
500.0000 mg | ORAL_CAPSULE | Freq: Two times a day (BID) | ORAL | Status: DC
  Administered 2023-03-08 – 2023-03-09 (×2): 500 mg via ORAL
  Filled 2023-03-08 (×2): qty 1

## 2023-03-08 MED ORDER — SODIUM CHLORIDE 0.9 % IV BOLUS
250.0000 mL | Freq: Once | INTRAVENOUS | Status: AC
  Administered 2023-03-08: 250 mL via INTRAVENOUS

## 2023-03-08 MED ORDER — FUROSEMIDE 20 MG PO TABS
20.0000 mg | ORAL_TABLET | Freq: Every day | ORAL | Status: DC
  Administered 2023-03-08 – 2023-03-09 (×2): 20 mg via ORAL
  Filled 2023-03-08 (×2): qty 1

## 2023-03-08 MED ORDER — VITAMIN K1 10 MG/ML IJ SOLN
10.0000 mg | Freq: Once | INTRAVENOUS | Status: AC
  Administered 2023-03-08: 10 mg via INTRAVENOUS
  Filled 2023-03-08: qty 1

## 2023-03-08 MED ORDER — SPIRONOLACTONE 25 MG PO TABS
50.0000 mg | ORAL_TABLET | Freq: Every morning | ORAL | Status: DC
  Administered 2023-03-09: 50 mg via ORAL
  Filled 2023-03-08: qty 2

## 2023-03-08 MED ORDER — IOHEXOL 300 MG/ML  SOLN
100.0000 mL | Freq: Once | INTRAMUSCULAR | Status: AC | PRN
  Administered 2023-03-08: 100 mL via INTRAVENOUS

## 2023-03-08 MED ORDER — LIDOCAINE HCL 1 % IJ SOLN
INTRAMUSCULAR | Status: AC
  Filled 2023-03-08: qty 20

## 2023-03-08 NOTE — Plan of Care (Signed)
Request to IR for diagnostic and therapeutic paracentesis.   Patient seen in [place], he reports not feeling distended and does not feel that he needs paracentesis. He has had 4 paracentesis so far and he states he knows when he needs one based on how he feels. He denies abdominal pain/distention, fevers, chills.  Limited abd [place] of all 4 quadrants shows very small amount of perihepatic ascites only which is potentially amenable to paracentesis with increased risk of bleeding based on proximity to liver. Patient also additional at higher bleeding risk due to INR 2.0. Discussed request for labs and offered diagnostic paracentesis however patient declines. He wishes to wait for paracentesis until he is uncomfortable.  RN present during US examination who will let primary team know procedure deferred at this time.  Images available for review under imaging section of Epic.  Please place a new paracentesis order when patient is agreeable to proceed.  Candiss Norse, PA-C
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
## 2023-03-08 NOTE — Progress Notes (Signed)
Progress Note    ASSESSMENT AND PLAN:   EtOH liver cirrhosis with pHTN (stopped ETOH 07/2022) Ascites-not significant on [place] today. Not given Aldactone/Lasix Jaundice, thrombocytopenia, coagulopathy. No hepatic encephalopathy. Noncompliance H/O anxiety/depression/suicidal ideation/substance abuse-may not be a candidate for liver transplant.  Plan: -Low-salt diet. -I think we can resume Aldactone 50/Lasix 20 QD.  Increase dose gradually as outpt -Follow results of AFP -FU with Dr Mansouraty/APP clinic as outpt in 4-6 weeks. Would likely need EGD/colon -Avoid hepatotoxic meds and NSAIDs. -Will sign off for now    SUBJECTIVE   Doing well today. Ultrasound-minimal fluid.  Not amenable to paracentesis.  Besides, pt declined paracenteses.  No GI complaints   OBJECTIVE:     Vital signs in last 24 hours: Temp:  [97.9 F (36.6 C)-98.2 F (36.8 C)] 97.9 F (36.6 C) (03/31 0810) Pulse Rate:  [56-61] 56 (03/31 0810) Resp:  [16-18] 16 (03/31 0810) BP: (98)/(51-61) 98/61 (03/31 0810) SpO2:  [96 %] 96 % (03/31 0810) Last BM Date : 03/07/23 General:   Alert, well-developed male in NAD EENT:  Normal hearing, icteric sclera, conjunctive pink.  Abdomen:  Soft, nondistended, nontender.  Normal bowel sounds,.       Neurologic:  Alert and  oriented x4;  grossly normal neurologically. Psych:  Pleasant, cooperative.  Normal mood and affect.   Intake/Output from previous day: 03/30 0701 - 03/31 0700 In: 120 [P.O.:120] Out: -  Intake/Output this shift: No intake/output data recorded.  Lab Results: Recent Labs    03/06/23 0515 03/07/23 0539 03/08/23 0512  WBC 8.3 8.6 9.4  HGB 9.6* 9.8* 10.0*  HCT 28.3* 29.0* 29.7*  PLT 104* 100* 94*   BMET Recent Labs    03/06/23 0515 03/07/23 0539 03/08/23 0512  NA 134* 137 135  K 4.0 4.0 4.3  CL 102 105 103  CO2 25 25 26   GLUCOSE 109* 92 95  BUN 11 11 14   CREATININE 0.96 0.84 0.90  CALCIUM 8.4* 8.4* 8.4*   LFT Recent  Labs    03/08/23 0512  PROT 6.1*  ALBUMIN 2.2*  AST 54*  ALT 20  ALKPHOS 144*  BILITOT 4.4*   PT/INR Recent Labs    03/07/23 0539 03/08/23 0512  LABPROT 22.1* 22.6*  INR 2.0* 2.0*   Hepatitis Panel No results for input(s): "HEPBSAG", "HCVAB", "HEPAIGM", "HEPBIGM" in the last 72 hours.  [place] ASCITES (ABDOMEN LIMITED)  Result Date: 03/08/2023 CLINICAL DATA:  Evaluation of ascites for paracentesis. History of prior paracentesis procedures in 2023. EXAM: LIMITED ABDOMEN ULTRASOUND FOR ASCITES TECHNIQUE: Limited ultrasound survey for ascites was performed in all four abdominal quadrants. COMPARISON:  CT a of the chest on 03/04/2023 FINDINGS: Sonographic evaluation of the peritoneal cavity demonstrates relatively small amount of scattered ascites with the largest amount in the right upper quadrant adjacent to the liver. The patient was offered paracentesis but declined to have it performed. IMPRESSION: Small amount of scattered ascites, largest amount in the right upper quadrant adjacent to the liver. The patient declined paracentesis. Electronically Signed   By: Aletta Edouard M.D.   On: 03/08/2023 10:46     Principal Problem:   Hemoptysis Active Problems:   Anxiety   GAD (generalized anxiety disorder)   Alcoholic cirrhosis of liver with ascites (HCC)   Portal hypertension (HCC)   Chronic pain of right ankle   Acute deep vein thrombosis (DVT) of brachial vein of left upper extremity (HCC)   DVT (deep venous thrombosis) ([place])  LOS: 3 days     Carmell Austria, MD 03/08/2023, 10:49 AM [facility] [phone]

## 2023-03-09 ENCOUNTER — Other Ambulatory Visit (HOSPITAL_COMMUNITY): Payer: Self-pay

## 2023-03-09 DIAGNOSIS — Z8249 Family history of ischemic heart disease and other diseases of the circulatory system: Secondary | ICD-10-CM | POA: Diagnosis not present

## 2023-03-09 DIAGNOSIS — Z86718 Personal history of other venous thrombosis and embolism: Secondary | ICD-10-CM | POA: Diagnosis not present

## 2023-03-09 DIAGNOSIS — D6959 Other secondary thrombocytopenia: Secondary | ICD-10-CM | POA: Diagnosis present

## 2023-03-09 DIAGNOSIS — F411 Generalized anxiety disorder: Secondary | ICD-10-CM | POA: Diagnosis present

## 2023-03-09 DIAGNOSIS — L02512 Cutaneous abscess of left hand: Secondary | ICD-10-CM | POA: Diagnosis present

## 2023-03-09 DIAGNOSIS — D649 Anemia, unspecified: Secondary | ICD-10-CM | POA: Diagnosis present

## 2023-03-09 DIAGNOSIS — K766 Portal hypertension: Secondary | ICD-10-CM | POA: Diagnosis present

## 2023-03-09 DIAGNOSIS — K7040 Alcoholic hepatic failure without coma: Secondary | ICD-10-CM | POA: Diagnosis present

## 2023-03-09 DIAGNOSIS — F17210 Nicotine dependence, cigarettes, uncomplicated: Secondary | ICD-10-CM | POA: Diagnosis present

## 2023-03-09 DIAGNOSIS — R042 Hemoptysis: Secondary | ICD-10-CM | POA: Diagnosis not present

## 2023-03-09 DIAGNOSIS — L03114 Cellulitis of left upper limb: Secondary | ICD-10-CM | POA: Diagnosis present

## 2023-03-09 DIAGNOSIS — R601 Generalized edema: Secondary | ICD-10-CM | POA: Diagnosis present

## 2023-03-09 DIAGNOSIS — D684 Acquired coagulation factor deficiency: Secondary | ICD-10-CM | POA: Diagnosis present

## 2023-03-09 DIAGNOSIS — K7031 Alcoholic cirrhosis of liver with ascites: Secondary | ICD-10-CM | POA: Diagnosis present

## 2023-03-09 DIAGNOSIS — E876 Hypokalemia: Secondary | ICD-10-CM | POA: Diagnosis present

## 2023-03-09 DIAGNOSIS — R45851 Suicidal ideations: Secondary | ICD-10-CM | POA: Diagnosis present

## 2023-03-09 DIAGNOSIS — F1010 Alcohol abuse, uncomplicated: Secondary | ICD-10-CM | POA: Diagnosis present

## 2023-03-09 DIAGNOSIS — L98499 Non-pressure chronic ulcer of skin of other sites with unspecified severity: Secondary | ICD-10-CM | POA: Diagnosis present

## 2023-03-09 DIAGNOSIS — F332 Major depressive disorder, recurrent severe without psychotic features: Secondary | ICD-10-CM | POA: Diagnosis present

## 2023-03-09 DIAGNOSIS — K068 Other specified disorders of gingiva and edentulous alveolar ridge: Secondary | ICD-10-CM | POA: Diagnosis present

## 2023-03-09 LAB — AFP TUMOR MARKER: AFP, Serum, Tumor Marker: 3.3 ng/mL (ref 0.0–8.4)

## 2023-03-09 MED ORDER — CEPHALEXIN 500 MG PO CAPS
500.0000 mg | ORAL_CAPSULE | Freq: Two times a day (BID) | ORAL | 0 refills | Status: AC
  Filled 2023-03-09: qty 6, 3d supply, fill #0

## 2023-03-09 MED ORDER — MIDODRINE HCL 5 MG PO TABS
5.0000 mg | ORAL_TABLET | Freq: Three times a day (TID) | ORAL | 0 refills | Status: DC
  Filled 2023-03-09: qty 90, 30d supply, fill #0
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
## 2023-03-09 NOTE — Discharge Summary (Signed)
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       Yes               Acute  +----------+------------+---------+-----------+----------+-------+  Summary:  Right: No evidence of thrombosis in the subclavian.  Left: Findings consistent with acute  deep vein thrombosis involving the left brachial veins. Findings consistent with acute superficial vein thrombosis involving the left basilic vein.  *See table(s) above for measurements and observations.  Diagnosing physician: Orlie Pollen Electronically signed by Orlie Pollen on 02/19/2023 at 5:37:26 PM.    Final     Microbiology: No results found for this or any previous visit (from the past 240 hour(s)).   Labs: Basic Metabolic Panel: Recent Labs  Lab 03/04/23 0430 03/05/23 0549 03/06/23 0515 03/07/23 0539 03/08/23 0512  NA 135 134* 134* 137 135  K  3.5 3.4* 4.0 4.0 4.3  CL 102 103 102 105 103  CO2 25 26 25 25 26   GLUCOSE 109* 87 109* 92 95  BUN 10 9 11 11 14   CREATININE 0.77 0.87 0.96 0.84 0.90  CALCIUM 8.7* 8.3* 8.4* 8.4* 8.4*   Liver Function Tests: Recent Labs  Lab 03/04/23 0430 03/06/23 0515 03/07/23 0539 03/08/23 0512  AST 67* 52* 53* 54*  ALT 23 20 20 20   ALKPHOS 159* 145* 133* 144*  BILITOT 5.9* 5.0* 4.7* 4.4*  PROT 6.8 6.1* 5.9* 6.1*  ALBUMIN 2.5* 2.2* 2.3* 2.2*   No results for input(s): "LIPASE", "AMYLASE" in the last 168 hours. No results for input(s): "AMMONIA" in the last 168 hours. CBC: Recent Labs  Lab 03/04/23 0430 03/04/23 2054 03/05/23 0549 03/06/23 0515 03/07/23 0539 03/08/23 0512  WBC 9.2 7.8 6.8 8.3 8.6 9.4  NEUTROABS 3.8  --   --   --   --   --   HGB 10.5* 9.6* 9.5* 9.6* 9.8* 10.0*  HCT 31.0* 28.0* 28.1* 28.3* 29.0* 29.7*  MCV 109.9* 109.4* 109.3* 109.7* 109.0* 110.4*  PLT 122* 117* 115* 104* 100* 94*   Cardiac Enzymes: No results for input(s): "CKTOTAL", "CKMB", "CKMBINDEX", "TROPONINI" in the last 168 hours. BNP: BNP (last 3 results) Recent Labs    01/22/23 1928  BNP 161.2*    ProBNP (last 3 results) No results for input(s): "PROBNP" in the last 8760 hours.  CBG: No results for input(s): "GLUCAP" in the last 168 hours.     Signed:  Nita Sells MD   [facility] 03/09/2023, 2:19 PM

## 2023-03-09 NOTE — Plan of Care (Signed)

## 2023-03-10 ENCOUNTER — Telehealth: Payer: Self-pay

## 2023-03-10 ENCOUNTER — Other Ambulatory Visit (HOSPITAL_COMMUNITY): Payer: Self-pay

## 2023-03-10 NOTE — Transitions of Care (Post Inpatient/ED Visit) (Signed)
   03/10/2023  Name: Nathaniel Warren MRN: [id number] DOB: [birth year]  Today's TOC FU Call Status: Today's TOC FU Call Status:: Unsuccessul Call (1st Attempt) Unsuccessful Call (1st Attempt) Date: 03/10/23  Attempted to reach the patient regarding the most recent Inpatient/ED visit.  Follow Up Plan: Additional outreach attempts will be made to reach the patient to complete the Transitions of Care (Post Inpatient/ED visit) call.   Norton Blizzard, [place] (AAMA)  [place] [facility] [phone]

## 2023-03-11 NOTE — Transitions of Care (Post Inpatient/ED Visit) (Signed)
   03/11/2023  Name: Nathaniel Warren MRN: [id number] DOB: [birth year]  Today's TOC FU Call Status: Today's TOC FU Call Status:: Unsuccessul Call (1st Attempt) Unsuccessful Call (1st Attempt) Date: 03/10/23 Unsuccessful Call (2nd Attempt) Date: 03/11/23  Attempted to reach the patient regarding the most recent Inpatient/ED visit.  Follow Up Plan: Additional outreach attempts will be made to reach the patient to complete the Transitions of Care (Post Inpatient/ED visit) call.   Norton Blizzard, [place] (AAMA)  [place] [facility] [phone]

## 2023-03-24 ENCOUNTER — Ambulatory Visit: Payer: Self-pay | Admitting: Nurse Practitioner

## 2023-03-24 NOTE — Progress Notes (Deleted)
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 He had been scheduled for an EGD for variceal screening and a colonoscopy for colon cancer screening earlier this year but missed those appointments.  He was last seen in office by Dr. Meridee Score on 08/04/2022, at that time the patient was adhering to a low sodium diet and was reducing his alcohol intake.  He was admitted to the hospital. CTAP showed a distended gallbladder with small gallstones, CBD was mildly prominent without intrahepatic biliary ductal dilatation or liver lesions, small bowel wall thickening and diverticulosis. Limited RUQ sono 03/08/2023 showed a small amount of ascites, patient declined paracentesis.   ? No alcohol since 07/2022     Latest Ref Rng & Units 03/08/2023    5:12 AM 03/07/2023    5:39 AM 03/06/2023    5:15 AM  CBC  WBC 4.0 - 10.5 K/uL 9.4  8.6  8.3   Hemoglobin 13.0 - 17.0 g/dL 40.9  9.8  9.6   Hematocrit 39.0 - 52.0 % 29.7  29.0  28.3   Platelets 150 - 400 K/uL 94  100  104         Latest Ref Rng & Units 03/08/2023    5:12 AM 03/07/2023    5:39 AM 03/06/2023    5:15 AM  CMP  Glucose 70 - 99 mg/dL 95  92  811   BUN 6 - 20 mg/dL Creatinine 0.61 - 1.24 mg/dL 9.14  7.82  9.56   Sodium 135 - 145 mmol/L 135  137  134   Potassium 3.5 - 5.1 mmol/L 4.3  4.0  4.0   Chloride 98 - 111 mmol/L 103  105  102   CO2 22 - 32 mmol/L Calcium 8.9 - 10.3 mg/dL 8.4  8.4  8.4   Total Protein 6.5 - 8.1 g/dL 6.1  5.9  6.1   Total Bilirubin 0.3 - 1.2 mg/dL 4.4  4.7  5.0   Alkaline Phos 38 - 126 U/L 144  133  145   AST 15 - 41 U/L 54  53  52   ALT 0 - 44 U/L AFP 3.3.  MELD  3.0: 22 at 03/08/2023  5:12 AM MELD-Na: 21 at 03/08/2023  5:12 AM Calculated from: Serum Creatinine: 0.90 mg/dL (Using min of 1 mg/dL) at 01/21/864  7:84 AM Serum Sodium: 135 mmol/L at 03/08/2023  5:12 AM Total Bilirubin: 4.4 mg/dL at 6/96/2952  8:41 AM Serum Albumin: 2.2 g/dL at 02/28/4009  2:72 AM INR(ratio): 2.0 at 03/08/2023  5:12 AM Age at listing (hypothetical): 57 years Sex: Male at 03/08/2023  5:12 AM   CTAP 01/22/2023: FINDINGS: Lower chest: No acute abnormality.   Hepatobiliary: Gallbladder is dilated. Small gallstones are present. There is mild gallbladder wall edema. Common bile duct is slightly prominent in size. No focal liver lesions are seen. There is no intrahepatic biliary ductal dilatation.   Pancreas: Unremarkable. No pancreatic ductal dilatation or surrounding inflammatory changes.   Spleen: Normal in size without focal abnormality.   Adrenals/Urinary Tract: Adrenal glands are unremarkable. Kidneys are normal, without renal  calculi, focal lesion, or hydronephrosis. Bladder is unremarkable.   Stomach/Bowel: There is some wall thickening of small bowel loops in the left abdomen with associated mesenteric edema. There is no evidence for bowel obstruction. There is no pneumatosis or free air. There is colonic diverticulosis without evidence for acute diverticulitis. The appendix is within normal limits.   Vascular/Lymphatic: Aortic atherosclerosis. No enlarged abdominal or pelvic lymph nodes.   Reproductive: Prostate is unremarkable.   Other: There is a small supraumbilical ventral hernia containing mesenteric fat and a small amount of free fluid. There is small volume ascites throughout the abdomen and pelvis. There is a small fat containing left inguinal hernia.   Musculoskeletal: Multilevel degenerative changes affect the spine   IMPRESSION: 1. Wall thickening of small bowel loops in the left abdomen with associated mesenteric edema. Findings are  compatible with nonspecific enteritis. 2. Small volume ascites. 3. Cholelithiasis with gallbladder wall edema and prominence of the common bile duct. Correlate clinically for acute cholecystitis. Consider ultrasound. 4. Colonic diverticulosis without evidence for acute diverticulitis. 5. Small supraumbilical ventral hernia containing mesenteric fat and free fluid.  Liver MRI 02/17/2022: 1. Motion degraded study. Nodular liver contour compatible with the reported clinical history of cirrhosis. No focal worrisome hepatic mass to suggest hepatocellular carcinoma. 2. Moderate to large volume ascites. Current Medications, Allergies, Past Medical History, Past Surgical History, Family History and Social History were reviewed in [employer] record.   Review of Systems:   Constitutional: Negative for fever, sweats, chills or weight loss.  Respiratory: Negative for shortness of breath.   Cardiovascular: Negative for chest pain, palpitations and leg swelling.  Gastrointestinal: See HPI.  Musculoskeletal: Negative for back pain or muscle aches.  Neurological: Negative for dizziness, headaches or paresthesias.    Physical Exam: There were no vitals taken for this visit. General: in no acute distress. Head: Normocephalic and atraumatic. Eyes: No scleral icterus. Conjunctiva pink . Ears: Normal auditory acuity. Mouth: Dentition intact. No ulcers or lesions.  Lungs: Clear throughout to auscultation. Heart: Regular rate and rhythm, no murmur. Abdomen: Soft, nontender and nondistended. No masses or hepatomegaly. Normal bowel sounds x 4 quadrants.  Rectal: Deferred.  Musculoskeletal: Symmetrical with no gross deformities. Extremities: No edema. Neurological: Alert oriented x 4. No focal deficits.  Psychological: Alert and cooperative. Normal mood and affect  Assessment and Recommendations:  57 year old male with alcohol decompensated cirrhosis  -CBC, BMP, hepatic  panel, PT/INR  -AFP due 08/2023 -EGD -2gm low sodium diet -Complete alcohol abstinenece  Chronic anemia   Thrombocytopenia   Colon cancer screening  -Colonoscopy

## 2023-03-25 NOTE — Transitions of Care (Post Inpatient/ED Visit) (Signed)
   03/25/2023  Name: MARSHAL ESKEW MRN: [id number] DOB: Nov 08, 1966  Today's TOC FU Call Status: Today's TOC FU Call Status:: Unsuccessul Call (1st Attempt) Unsuccessful Call (1st Attempt) Date: 03/10/23 Unsuccessful Call (2nd Attempt) Date: 03/11/23 Unsuccessful Call (3rd Attempt) Date: 03/25/23  Attempted to reach the patient regarding the most recent Inpatient/ED visit.  Follow Up Plan: No further outreach attempts will be made at this time. We have been unable to contact the patient.  Signature Agnes Lawrence, CMA (AAMA)  [facility] [phone]

## 2023-04-04 ENCOUNTER — Emergency Department (HOSPITAL_COMMUNITY)
Admission: EM | Admit: 2023-04-04 | Discharge: 2023-04-05 | Disposition: A | Discharge status: Home / Self Care | Payer: Medicaid Other | Attending: Emergency Medicine | Admitting: Emergency Medicine

## 2023-04-04 ENCOUNTER — Other Ambulatory Visit: Payer: Self-pay

## 2023-04-04 ENCOUNTER — Encounter (HOSPITAL_COMMUNITY): Payer: Self-pay

## 2023-04-04 ENCOUNTER — Emergency Department (HOSPITAL_COMMUNITY): Payer: Medicaid Other

## 2023-04-04 DIAGNOSIS — K7030 Alcoholic cirrhosis of liver without ascites: Secondary | ICD-10-CM | POA: Diagnosis present

## 2023-04-04 DIAGNOSIS — K7031 Alcoholic cirrhosis of liver with ascites: Secondary | ICD-10-CM | POA: Diagnosis present

## 2023-04-04 DIAGNOSIS — R600 Localized edema: Secondary | ICD-10-CM | POA: Diagnosis not present

## 2023-04-04 DIAGNOSIS — K838 Other specified diseases of biliary tract: Secondary | ICD-10-CM | POA: Diagnosis not present

## 2023-04-04 DIAGNOSIS — K8050 Calculus of bile duct without cholangitis or cholecystitis without obstruction: Secondary | ICD-10-CM | POA: Insufficient documentation

## 2023-04-04 DIAGNOSIS — R072 Precordial pain: Secondary | ICD-10-CM

## 2023-04-04 DIAGNOSIS — D696 Thrombocytopenia, unspecified: Secondary | ICD-10-CM | POA: Diagnosis present

## 2023-04-04 DIAGNOSIS — R0789 Other chest pain: Secondary | ICD-10-CM

## 2023-04-04 DIAGNOSIS — R079 Chest pain, unspecified: Secondary | ICD-10-CM | POA: Diagnosis present

## 2023-04-04 LAB — BASIC METABOLIC PANEL
Anion gap: 10 (ref 5–15)
BUN: 15 mg/dL (ref 6–20)
CO2: 25 mmol/L (ref 22–32)
Calcium: 8.6 mg/dL — ABNORMAL LOW (ref 8.9–10.3)
Chloride: 98 mmol/L (ref 98–111)
Creatinine, Ser: 1.16 mg/dL (ref 0.61–1.24)
GFR, Estimated: >60 mL/min (ref >60)
Glucose, Bld: 104 mg/dL — ABNORMAL HIGH (ref 70–99)
Potassium: 4.5 mmol/L (ref 3.5–5.1)
Sodium: 133 mmol/L — ABNORMAL LOW (ref 135–145)

## 2023-04-04 LAB — CBC
HCT: 23.8 % — ABNORMAL LOW (ref 39.0–52.0)
Hemoglobin: 8.6 g/dL — ABNORMAL LOW (ref 13.0–17.0)
MCH: 37.2 pg — ABNORMAL HIGH (ref 26.0–34.0)
MCHC: 36.1 g/dL — ABNORMAL HIGH (ref 30.0–36.0)
MCV: 103 fL — ABNORMAL HIGH (ref 80.0–100.0)
Platelets: 73 10*3/uL — ABNORMAL LOW (ref 150–400)
RBC: 2.31 MIL/uL — ABNORMAL LOW (ref 4.22–5.81)
RDW: 16.2 % — ABNORMAL HIGH (ref 11.5–15.5)
WBC: 15.7 10*3/uL — ABNORMAL HIGH (ref 4.0–10.5)
nRBC: 0.2 % (ref 0.0–0.2)

## 2023-04-04 LAB — PROTIME-INR
INR: 1.9 — ABNORMAL HIGH (ref 0.8–1.2)
Prothrombin Time: 21.4 seconds — ABNORMAL HIGH (ref 11.4–15.2)

## 2023-04-04 LAB — TROPONIN I (HIGH SENSITIVITY): Troponin I (High Sensitivity): 6 ng/L (ref <18)

## 2023-04-04 MED ORDER — DIAZEPAM 5 MG/ML IJ SOLN
5.0000 mg | Freq: Once | INTRAMUSCULAR | Status: AC
  Administered 2023-04-05: 5 mg via INTRAVENOUS
  Filled 2023-04-04: qty 2

## 2023-04-04 NOTE — ED Triage Notes (Signed)
Patient BIB [facility] from home with complaint of left sided chest pain that radiated down left arm starting today at 4pm with nausea & SOB.   Patient took 1 4mg  zofran at home . EMS administered 0.4 nitro SL, 324 ASA

## 2023-04-05 ENCOUNTER — Telehealth: Payer: Self-pay | Admitting: Nurse Practitioner

## 2023-04-05 ENCOUNTER — Encounter (HOSPITAL_COMMUNITY): Payer: Self-pay | Admitting: Internal Medicine

## 2023-04-05 ENCOUNTER — Emergency Department (HOSPITAL_COMMUNITY): Payer: Medicaid Other

## 2023-04-05 ENCOUNTER — Emergency Department (HOSPITAL_BASED_OUTPATIENT_CLINIC_OR_DEPARTMENT_OTHER): Payer: Medicaid Other

## 2023-04-05 DIAGNOSIS — R0789 Other chest pain: Secondary | ICD-10-CM

## 2023-04-05 DIAGNOSIS — D696 Thrombocytopenia, unspecified: Secondary | ICD-10-CM

## 2023-04-05 DIAGNOSIS — R609 Edema, unspecified: Secondary | ICD-10-CM

## 2023-04-05 DIAGNOSIS — K7031 Alcoholic cirrhosis of liver with ascites: Secondary | ICD-10-CM

## 2023-04-05 DIAGNOSIS — R600 Localized edema: Secondary | ICD-10-CM

## 2023-04-05 DIAGNOSIS — K838 Other specified diseases of biliary tract: Secondary | ICD-10-CM

## 2023-04-05 LAB — HEPATIC FUNCTION PANEL
ALT: 27 U/L (ref 0–44)
AST: 65 U/L — ABNORMAL HIGH (ref 15–41)
Albumin: 2.3 g/dL — ABNORMAL LOW (ref 3.5–5.0)
Alkaline Phosphatase: 242 U/L — ABNORMAL HIGH (ref 38–126)
Bilirubin, Direct: 2.8 mg/dL — ABNORMAL HIGH (ref 0.0–0.2)
Indirect Bilirubin: 4.1 mg/dL — ABNORMAL HIGH (ref 0.3–0.9)
Total Bilirubin: 6.9 mg/dL — ABNORMAL HIGH (ref 0.3–1.2)
Total Protein: 6.8 g/dL (ref 6.5–8.1)

## 2023-04-05 LAB — AMMONIA: Ammonia: 24 umol/L (ref 9–35)

## 2023-04-05 LAB — LACTIC ACID, PLASMA
Lactic Acid, Venous: 1.3 mmol/L (ref 0.5–1.9)
Lactic Acid, Venous: 1.1 mmol/L (ref 0.5–1.9)

## 2023-04-05 LAB — TROPONIN I (HIGH SENSITIVITY): Troponin I (High Sensitivity): 6 ng/L (ref <18)

## 2023-04-05 MED ORDER — LORAZEPAM 2 MG/ML IJ SOLN
1.0000 mg | Freq: Once | INTRAMUSCULAR | Status: AC
  Administered 2023-04-05: 1 mg via INTRAVENOUS
  Filled 2023-04-05: qty 1

## 2023-04-05 MED ORDER — ONDANSETRON HCL 4 MG PO TABS
4.0000 mg | ORAL_TABLET | Freq: Three times a day (TID) | ORAL | 0 refills | Status: DC | PRN
  Filled 2023-04-05 – 2023-04-06 (×2): qty 12, 4d supply, fill #0

## 2023-04-05 MED ORDER — IOHEXOL 350 MG/ML SOLN
75.0000 mL | Freq: Once | INTRAVENOUS | Status: AC | PRN
  Administered 2023-04-05: 75 mL via INTRAVENOUS

## 2023-04-05 MED ORDER — LACTATED RINGERS IV SOLN: INTRAVENOUS | Status: AC

## 2023-04-05 MED ORDER — HYDROMORPHONE HCL 1 MG/ML IJ SOLN
1.0000 mg | Freq: Once | INTRAMUSCULAR | Status: AC
  Administered 2023-04-05: 1 mg via INTRAVENOUS

## 2023-04-05 MED ORDER — SODIUM CHLORIDE 0.9 % IV BOLUS
1000.0000 mL | Freq: Once | INTRAVENOUS | Status: AC
  Administered 2023-04-05: 1000 mL via INTRAVENOUS

## 2023-04-05 MED ORDER — HYDROMORPHONE HCL 1 MG/ML IJ SOLN
INTRAMUSCULAR | Status: AC
  Filled 2023-04-05: qty 1

## 2023-04-05 MED ORDER — GADOBUTROL 1 MMOL/ML IV SOLN
10.0000 mL | Freq: Once | INTRAVENOUS | Status: AC | PRN
  Administered 2023-04-05: 10 mL via INTRAVENOUS

## 2023-04-05 MED ORDER — PIPERACILLIN-TAZOBACTAM 3.375 G IVPB 30 MIN
3.3750 g | Freq: Once | INTRAVENOUS | Status: AC
  Administered 2023-04-05: 3.375 g via INTRAVENOUS
  Filled 2023-04-05: qty 50

## 2023-04-05 NOTE — ED Provider Notes (Signed)
Received signout from previous provider, please see his note for complete H&P.  This is a 57 year old male with history of alcohol use presenting with initial complaints of chest pain and associated shortness of breath with nausea and vomiting that started since yesterday.  His workup was remarkable for elevated white count of 15.7, worsening transaminitis, and appears mildly jaundice.  Initial concern was for biliary obstruction and patient was started on Zosyn, received opiate pain medication, antiemetic, muscle relaxant and anxiolytic.  Hospitalist was consulted but requested MRCP to result before admission.  MRCP was obtained independently viewed interpreted by me and overall did not show any concerning finding however MRCP results is slightly obscured by motion artifact.  I have read through EMR note as well as note from hospitalist Dr. Imogene Burn who has seen and evaluated patient and felt patient can be discharged home for outpatient follow-up if MRCP is negative.  His DVT study is negative as well.  I appreciate consultation from from [facility], Gunnar Fusi, who will have the office contact patient for an outpatient paracentesis.  BP (!) 108/55 (BP Location: Left Arm)   Pulse 82   Temp 98 F (36.7 C)   Resp 19   Ht 5\' 9"  (1.753 m)   Wt 81.6 kg   SpO2 99%   BMI 26.58 kg/m   Results for orders placed or performed during the hospital encounter of 04/04/23  Basic metabolic panel  Result Value Ref Range   Sodium 133 (L) 135 - 145 mmol/L   Potassium 4.5 3.5 - 5.1 mmol/L   Chloride 98 98 - 111 mmol/L   CO2 25 22 - 32 mmol/L   Glucose, Bld 104 (H) 70 - 99 mg/dL   BUN 15 6 - 20 mg/dL   Creatinine, Ser 1.61 0.61 - 1.24 mg/dL   Calcium 8.6 (L) 8.9 - 10.3 mg/dL   GFR, Estimated >09 >60 mL/min   Anion gap 10 5 - 15  CBC  Result Value Ref Range   WBC 15.7 (H) 4.0 - 10.5 K/uL   RBC 2.31 (L) 4.22 - 5.81 MIL/uL   Hemoglobin 8.6 (L) 13.0 - 17.0 g/dL   HCT 45.4 (L) 09.8 - 11.9 %   MCV 103.0 (H)  80.0 - 100.0 fL   MCH 37.2 (H) 26.0 - 34.0 pg   MCHC 36.1 (H) 30.0 - 36.0 g/dL   RDW 14.7 (H) 82.9 - 56.2 %   Platelets 73 (L) 150 - 400 K/uL   nRBC 0.2 0.0 - 0.2 %  Protime-INR (order if Patient is taking Coumadin / Warfarin)  Result Value Ref Range   Prothrombin Time 21.4 (H) 11.4 - 15.2 seconds   INR 1.9 (H) 0.8 - 1.2  Hepatic function panel  Result Value Ref Range   Total Protein 6.8 6.5 - 8.1 g/dL   Albumin 2.3 (L) 3.5 - 5.0 g/dL   AST 65 (H) 15 - 41 U/L   ALT 27 0 - 44 U/L   Alkaline Phosphatase 242 (H) 38 - 126 U/L   Total Bilirubin 6.9 (H) 0.3 - 1.2 mg/dL   Bilirubin, Direct 2.8 (H) 0.0 - 0.2 mg/dL   Indirect Bilirubin 4.1 (H) 0.3 - 0.9 mg/dL  Ammonia  Result Value Ref Range   Ammonia 24 9 - 35 umol/L  Lactic acid, plasma  Result Value Ref Range   Lactic Acid, Venous 1.3 0.5 - 1.9 mmol/L  Lactic acid, plasma  Result Value Ref Range   Lactic Acid, Venous 1.1 0.5 - 1.9  mmol/L  Troponin I (High Sensitivity)  Result Value Ref Range   Troponin I (High Sensitivity) 6 <18 ng/L  Troponin I (High Sensitivity)  Result Value Ref Range   Troponin I (High Sensitivity) 6 <18 ng/L   VAS [place] LOWER EXTREMITY VENOUS (DVT) (ONLY [facility] & [facility])  Result Date: 04/05/2023  Lower Venous DVT Study Patient Name:  Nathaniel Warren  Date of Exam:   04/05/2023 Medical Rec #: [id number]        Accession #:    [id number] Date of Birth: 28-Aug-1966         Patient Gender: M Patient Age:   66 years Exam Location:  [facility] Procedure:      VAS [place] LOWER EXTREMITY VENOUS (DVT) Referring Phys: Berle Mull --------------------------------------------------------------------------------  Indications: Swelling.  Risk Factors: ETOH abuse, cirrhosis of liver with ascites. Anasarca. Comparison Study: No prior left LEV on file Performing Technologist: Sherren Kerns RVS  Examination Guidelines: A complete evaluation includes B-mode imaging, spectral Doppler, color Doppler, and power Doppler as needed of all  accessible portions of each vessel. Bilateral testing is considered an integral part of a complete examination. Limited examinations for reoccurring indications may be performed as noted. The reflux portion of the exam is performed with the patient in reverse Trendelenburg.  +-----+---------------+---------+-----------+----------+--------------+ RIGHTCompressibilityPhasicitySpontaneityPropertiesThrombus Aging +-----+---------------+---------+-----------+----------+--------------+ CFV  Full           Yes      Yes                                 +-----+---------------+---------+-----------+----------+--------------+   +----------+---------------+---------+-----------+----------+--------------+ LEFT      CompressibilityPhasicitySpontaneityPropertiesThrombus Aging +----------+---------------+---------+-----------+----------+--------------+ CFV       Full           Yes      Yes                                 +----------+---------------+---------+-----------+----------+--------------+ SFJ       Full                                                        +----------+---------------+---------+-----------+----------+--------------+ FV Prox   Full                                                        +----------+---------------+---------+-----------+----------+--------------+ FV Mid    Full           Yes      Yes                                 +----------+---------------+---------+-----------+----------+--------------+ FV Distal Full           Yes      Yes                                 +----------+---------------+---------+-----------+----------+--------------+ PFV       Full                                                        +----------+---------------+---------+-----------+----------+--------------+  POP       Full           Yes      Yes                                 +----------+---------------+---------+-----------+----------+--------------+  PTV       Full                                                        +----------+---------------+---------+-----------+----------+--------------+ PERO      Full                                                        +----------+---------------+---------+-----------+----------+--------------+ EIV                      Yes      Yes                  patent         +----------+---------------+---------+-----------+----------+--------------+ CIV distal               Yes      Yes                  patent         +----------+---------------+---------+-----------+----------+--------------+     Summary: RIGHT: - No evidence of common femoral vein obstruction.  LEFT: - There is no evidence of deep vein thrombosis in the lower extremity.  - No cystic structure found in the popliteal fossa. Interstitial edema noted throughout.  *See table(s) above for measurements and observations. Electronically signed by Heath Lark on 04/05/2023 at 10:54:44 AM.    Final    MR ABDOMEN MRCP W WO CONTAST  Result Date: 04/05/2023 CLINICAL DATA:  Jaundice. EXAM: MRI ABDOMEN WITHOUT AND WITH CONTRAST (INCLUDING MRCP) TECHNIQUE: Multiplanar multisequence MR imaging of the abdomen was performed both before and after the administration of intravenous contrast. Heavily T2-weighted images of the biliary and pancreatic ducts were obtained, and three-dimensional MRCP images were rendered by post processing. CONTRAST:  10mL GADAVIST GADOBUTROL 1 MMOL/ML IV SOLN COMPARISON:  CT scan earlier same day. Ultrasound exam earlier same day FINDINGS: Markedly motion degraded study. Lower chest: Tiny bilateral effusions. Hepatobiliary: Evaluation of the liver is markedly motion degraded. Within this limitation, contour appears nodular. No focal or discrete restricted diffusion within the liver parenchyma. Postcontrast imaging shows no focal arterial phase hyperenhancement. No suspicious focal abnormality within the liver. Gallbladder  is markedly distended. No gallbladder wall thickening or mural edema evident by MRI. No discernible gallstones. Evaluation of bile ducts is markedly motion degraded. Common duct in the porta hepatis measures on the order of 10 mm diameter. There is gradual tapering of the common bile duct measuring 6-7 mm diameter just proximal to the ampulla. No definite choledocholithiasis. Pancreas: Motion degraded. No gross pancreatic lesion evident. No main duct dilatation. Spleen:  No focal mass lesion evident. Adrenals/Urinary Tract: No adrenal mass evident. No hydronephrosis. Postcontrast imaging of the kidneys is motion degraded but no appreciable enhancing mass lesion evident. Stomach/Bowel: Stomach is nondistended.  Wall thickening noted in the gastric antrum on CT earlier today is less prominent by MRI. Duodenum is normally positioned as is the ligament of Treitz. No small bowel or colonic dilatation in the abdomen. Vascular/Lymphatic: No abdominal aortic aneurysm. Portal vein, superior mesenteric vein, and splenic vein appear patent but assessment is markedly degraded by motion artifact. No abdominal lymphadenopathy Other: Moderate volume ascites with diffuse mesenteric and body wall edema. Supraumbilical ventral hernia contains fat and fluid. Musculoskeletal: No focal suspicious marrow enhancement within the visualized bony anatomy. IMPRESSION: 1. Markedly motion degraded study. Small or subtle lesions in the solid abdominal viscera could be obscured. 2. Distended gallbladder. Calcified gallstone(s) seen on CT earlier today are not discernible by MRI, likely obscured by motion artifact. No gallbladder wall thickening or mural edema evident by MRI. 3. Nodular hepatic contour suggests cirrhosis. 4. Mild dilatation of the common bile duct without definite choledocholithiasis. Common bile duct tapers gradually into the ampulla. 5. Moderate volume ascites with diffuse mesenteric and body wall edema. Tiny bilateral pleural  effusions. 6. Supraumbilical ventral hernia contains fat and fluid. Electronically Signed   By: Kennith Center M.D.   On: 04/05/2023 07:17   MR 3D Recon At Scanner  Result Date: 04/05/2023 CLINICAL DATA:  Jaundice. EXAM: MRI ABDOMEN WITHOUT AND WITH CONTRAST (INCLUDING MRCP) TECHNIQUE: Multiplanar multisequence MR imaging of the abdomen was performed both before and after the administration of intravenous contrast. Heavily T2-weighted images of the biliary and pancreatic ducts were obtained, and three-dimensional MRCP images were rendered by post processing. CONTRAST:  10mL GADAVIST GADOBUTROL 1 MMOL/ML IV SOLN COMPARISON:  CT scan earlier same day. Ultrasound exam earlier same day FINDINGS: Markedly motion degraded study. Lower chest: Tiny bilateral effusions. Hepatobiliary: Evaluation of the liver is markedly motion degraded. Within this limitation, contour appears nodular. No focal or discrete restricted diffusion within the liver parenchyma. Postcontrast imaging shows no focal arterial phase hyperenhancement. No suspicious focal abnormality within the liver. Gallbladder is markedly distended. No gallbladder wall thickening or mural edema evident by MRI. No discernible gallstones. Evaluation of bile ducts is markedly motion degraded. Common duct in the porta hepatis measures on the order of 10 mm diameter. There is gradual tapering of the common bile duct measuring 6-7 mm diameter just proximal to the ampulla. No definite choledocholithiasis. Pancreas: Motion degraded. No gross pancreatic lesion evident. No main duct dilatation. Spleen:  No focal mass lesion evident. Adrenals/Urinary Tract: No adrenal mass evident. No hydronephrosis. Postcontrast imaging of the kidneys is motion degraded but no appreciable enhancing mass lesion evident. Stomach/Bowel: Stomach is nondistended. Wall thickening noted in the gastric antrum on CT earlier today is less prominent by MRI. Duodenum is normally positioned as is the  ligament of Treitz. No small bowel or colonic dilatation in the abdomen. Vascular/Lymphatic: No abdominal aortic aneurysm. Portal vein, superior mesenteric vein, and splenic vein appear patent but assessment is markedly degraded by motion artifact. No abdominal lymphadenopathy Other: Moderate volume ascites with diffuse mesenteric and body wall edema. Supraumbilical ventral hernia contains fat and fluid. Musculoskeletal: No focal suspicious marrow enhancement within the visualized bony anatomy. IMPRESSION: 1. Markedly motion degraded study. Small or subtle lesions in the solid abdominal viscera could be obscured. 2. Distended gallbladder. Calcified gallstone(s) seen on CT earlier today are not discernible by MRI, likely obscured by motion artifact. No gallbladder wall thickening or mural edema evident by MRI. 3. Nodular hepatic contour suggests cirrhosis. 4. Mild dilatation of the common bile duct without definite choledocholithiasis. Common bile duct  tapers gradually into the ampulla. 5. Moderate volume ascites with diffuse mesenteric and body wall edema. Tiny bilateral pleural effusions. 6. Supraumbilical ventral hernia contains fat and fluid. Electronically Signed   By: Kennith Center M.D.   On: 04/05/2023 07:17   US Abdomen Limited  Result Date: 04/05/2023 CLINICAL DATA:  Right upper quadrant pain.  History of cirrhosis. EXAM: ULTRASOUND ABDOMEN LIMITED RIGHT UPPER QUADRANT COMPARISON:  03/28/2023. FINDINGS: Gallbladder: The gallbladder is distended and a small amount of sludge is noted. No stones or wall thickening is seen. No sonographic Murphy sign noted by sonographer. Common bile duct: Diameter: 1.3 cm. Liver: No focal lesion identified. Increased parenchymal echogenicity. The liver has a nodular contour, compatible with known cirrhosis. Portal vein is patent on color Doppler imaging with normal direction of blood flow towards the liver. Other: Moderate ascites. IMPRESSION: 1. Distended gallbladder with  sludge, suggesting hydrops. No evidence of acute cholecystitis. 2. Distended common bile duct measuring 13 mm. MRCP is suggested for further evaluation. 3. Morphologic changes of cirrhosis. 4. Moderate ascites. Electronically Signed   By: Thornell Sartorius M.D.   On: 04/05/2023 02:23   CT Angio Chest PE W and/or Wo Contrast  Result Date: 04/05/2023 CLINICAL DATA:  Pulmonary embolism suspected, high probability. Abdominal pain, acute, nonlocalized. EXAM: CT ANGIOGRAPHY CHEST CT ABDOMEN AND PELVIS WITH CONTRAST TECHNIQUE: Multidetector CT imaging of the chest was performed using the standard protocol during bolus administration of intravenous contrast. Multiplanar CT image reconstructions and MIPs were obtained to evaluate the vascular anatomy. Multidetector CT imaging of the abdomen and pelvis was performed using the standard protocol during bolus administration of intravenous contrast. RADIATION DOSE REDUCTION: This exam was performed according to the departmental dose-optimization program which includes automated exposure control, adjustment of the mA and/or kV according to patient size and/or use of iterative reconstruction technique. CONTRAST:  75mL OMNIPAQUE IOHEXOL 350 MG/ML SOLN COMPARISON:  03/04/2023. FINDINGS: CTA CHEST FINDINGS Cardiovascular: The heart is mildly enlarged and there is no pericardial effusion. Three-vessel coronary artery calcifications are noted. There is atherosclerotic calcification of the aorta without evidence of aneurysm. Pulmonary trunk is mildly distended, suggesting underlying pulmonary artery hypertension. No definite evidence of pulmonary embolism. Mediastinum/Nodes: No enlarged mediastinal, hilar, or axillary lymph nodes. Thyroid gland, trachea, and esophagus demonstrate no significant findings. Lungs/Pleura: There are trace bilateral pleural effusions with atelectasis. No pneumothorax. Musculoskeletal: Gynecomastia is present bilaterally. Degenerative changes are present in the  thoracic spine. No acute osseous abnormality. Review of the MIP images confirms the above findings. CT ABDOMEN and PELVIS FINDINGS Hepatobiliary: No focal abnormality in the liver. The liver has a nodular contour, suggesting underlying cirrhosis. A stone is noted in the gallbladder. No biliary ductal dilatation Pancreas: Unremarkable. No pancreatic ductal dilatation or surrounding inflammatory changes. Spleen: Normal in size without focal abnormality. Adrenals/Urinary Tract: Adrenal glands are unremarkable. Kidneys are normal, without renal calculi, focal lesion, or hydronephrosis. Bladder is unremarkable. Stomach/Bowel: There is thickening of the walls of the gastric antrum. Appendix appears normal. No evidence of bowel wall thickening, distention, or inflammatory changes. No free air or pneumatosis. Scattered diverticula are present along the colon without evidence of diverticulitis. Vascular/Lymphatic: Aortic atherosclerosis. 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 Varices are noted in the anterior abdomen. Prominent lymph nodes are noted in the gastrohepatic ligament and porta hepatis. Reproductive: Prostate gland is normal in size. Other: Moderate ascites is present. There is a midline ventral abdominal wall hernia containing fat and ascites. A left inguinal hernia is noted containing ascites.  Musculoskeletal: Degenerative changes are present in the lumbar spine. No acute osseous abnormality. Review of the MIP images confirms the above findings. IMPRESSION: 1. No evidence pulmonary embolism. 2. Trace bilateral pleural effusions with atelectasis. 3. Thickening of the walls of the gastric antrum. Endoscopy is suggested for further evaluation on follow-up. 4. Morphologic changes of cirrhosis and portal hypertension. 5. Moderate ascites. 6. Cholelithiasis. 7. Aortic atherosclerosis and coronary artery calcifications. Electronically Signed   By: Thornell Sartorius M.D.   On: 04/05/2023 01:45   CT ABDOMEN  PELVIS W CONTRAST  Result Date: 04/05/2023 CLINICAL DATA:  Pulmonary embolism suspected, high probability. Abdominal pain, acute, nonlocalized. EXAM: CT ANGIOGRAPHY CHEST CT ABDOMEN AND PELVIS WITH CONTRAST TECHNIQUE: Multidetector CT imaging of the chest was performed using the standard protocol during bolus administration of intravenous contrast. Multiplanar CT image reconstructions and MIPs were obtained to evaluate the vascular anatomy. Multidetector CT imaging of the abdomen and pelvis was performed using the standard protocol during bolus administration of intravenous contrast. RADIATION DOSE REDUCTION: This exam was performed according to the departmental dose-optimization program which includes automated exposure control, adjustment of the mA and/or kV according to patient size and/or use of iterative reconstruction technique. CONTRAST:  75mL OMNIPAQUE IOHEXOL 350 MG/ML SOLN COMPARISON:  03/04/2023. FINDINGS: CTA CHEST FINDINGS Cardiovascular: The heart is mildly enlarged and there is no pericardial effusion. Three-vessel coronary artery calcifications are noted. There is atherosclerotic calcification of the aorta without evidence of aneurysm. Pulmonary trunk is mildly distended, suggesting underlying pulmonary artery hypertension. No definite evidence of pulmonary embolism. Mediastinum/Nodes: No enlarged mediastinal, hilar, or axillary lymph nodes. Thyroid gland, trachea, and esophagus demonstrate no significant findings. Lungs/Pleura: There are trace bilateral pleural effusions with atelectasis. No pneumothorax. Musculoskeletal: Gynecomastia is present bilaterally. Degenerative changes are present in the thoracic spine. No acute osseous abnormality. Review of the MIP images confirms the above findings. CT ABDOMEN and PELVIS FINDINGS Hepatobiliary: No focal abnormality in the liver. The liver has a nodular contour, suggesting underlying cirrhosis. A stone is noted in the gallbladder. No biliary ductal  dilatation Pancreas: Unremarkable. No pancreatic ductal dilatation or surrounding inflammatory changes. Spleen: Normal in size without focal abnormality. Adrenals/Urinary Tract: Adrenal glands are unremarkable. Kidneys are normal, without renal calculi, focal lesion, or hydronephrosis. Bladder is unremarkable. Stomach/Bowel: There is thickening of the walls of the gastric antrum. Appendix appears normal. No evidence of bowel wall thickening, distention, or inflammatory changes. No free air or pneumatosis. Scattered diverticula are present along the colon without evidence of diverticulitis. Vascular/Lymphatic: Aortic atherosclerosis. 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 Varices are noted in the anterior abdomen. Prominent lymph nodes are noted in the gastrohepatic ligament and porta hepatis. Reproductive: Prostate gland is normal in size. Other: Moderate ascites is present. There is a midline ventral abdominal wall hernia containing fat and ascites. A left inguinal hernia is noted containing ascites. Musculoskeletal: Degenerative changes are present in the lumbar spine. No acute osseous abnormality. Review of the MIP images confirms the above findings. IMPRESSION: 1. No evidence pulmonary embolism. 2. Trace bilateral pleural effusions with atelectasis. 3. Thickening of the walls of the gastric antrum. Endoscopy is suggested for further evaluation on follow-up. 4. Morphologic changes of cirrhosis and portal hypertension. 5. Moderate ascites. 6. Cholelithiasis. 7. Aortic atherosclerosis and coronary artery calcifications. Electronically Signed   By: Thornell Sartorius M.D.   On: 04/05/2023 01:45   DG Chest 2 View  Result Date: 04/04/2023 CLINICAL DATA:  Chest pain EXAM: CHEST - 2 VIEW COMPARISON:  12/04/2022 FINDINGS:  Mild cardiomegaly. No acute airspace disease or effusion. No pneumothorax IMPRESSION: No active cardiopulmonary disease. Mild cardiomegaly. Electronically Signed   By: Jasmine Pang M.D.   On:  04/04/2023 23:24   CT HAND LEFT W CONTRAST  Result Date: 03/08/2023 CLINICAL DATA:  Soft tissue infection suspected. EXAM: CT OF THE UPPER LEFT EXTREMITY WITH CONTRAST TECHNIQUE: Multidetector CT imaging of the left hand was performed according to the standard protocol following intravenous contrast administration. RADIATION DOSE REDUCTION: This exam was performed according to the departmental dose-optimization program which includes automated exposure control, adjustment of the mA and/or kV according to patient size and/or use of iterative reconstruction technique. CONTRAST:  OMNIPAQUE IOHEXOL 300 MG/ML  SOLN COMPARISON:  Radiographs 02/02/2023.  MRI 02/03/2023. FINDINGS: Despite efforts by the technologist and patient, mild motion artifact is present on today's exam and could not be eliminated. This reduces exam sensitivity and specificity. Motion is greatest within the thumb. Bones/Joint/Cartilage No evidence of acute fracture, dislocation or bone destruction. As above, there is motion on the images through the thumb. No significant joint effusions are identified. Minimal degenerative subchondral cyst formation in the base of the 2nd metacarpal. Ligaments Suboptimally assessed by CT. Muscles and Tendons The hand muscles appear unremarkable as evaluated by CT. No focal atrophy, intramuscular fluid collection or abnormal enhancement identified. The flexor and extensor tendons appear intact without significant tenosynovitis. Soft tissues Previously demonstrated soft tissue ulceration around the nail bed of the ring finger was better demonstrated on MRI, but does not appear progressive. No foreign body, soft tissue emphysema or focal fluid collection identified. Nonspecific dorsal subcutaneous edema. IMPRESSION: 1. No evidence of osteomyelitis or soft tissue abscess. 2. Soft tissue ulceration around the nail bed of the ring finger was better demonstrated on MRI, but does not appear progressive. 3.  Nonspecific dorsal subcutaneous edema in the hand which could reflect cellulitis. 4. No acute findings identified. Electronically Signed   By: Carey Bullocks M.D.   On: 03/08/2023 13:00   [place] ASCITES (ABDOMEN LIMITED)  Result Date: 03/08/2023 CLINICAL DATA:  Evaluation of ascites for paracentesis. History of prior paracentesis procedures in 2023. EXAM: LIMITED ABDOMEN ULTRASOUND FOR ASCITES TECHNIQUE: Limited ultrasound survey for ascites was performed in all four abdominal quadrants. COMPARISON:  CT a of the chest on 03/04/2023 FINDINGS: Sonographic evaluation of the peritoneal cavity demonstrates relatively small amount of scattered ascites with the largest amount in the right upper quadrant adjacent to the liver. The patient was offered paracentesis but declined to have it performed. IMPRESSION: Small amount of scattered ascites, largest amount in the right upper quadrant adjacent to the liver. The patient declined paracentesis. Electronically Signed   By: Irish Lack M.D.   On: 03/08/2023 10:46      Fayrene Helper, PA-C 04/05/23 1302    Tegeler, Canary Brim, MD 04/05/23 1515

## 2023-04-05 NOTE — ED Notes (Signed)
Patient resting in bed with eyes closed, no s/s of distress, will continue to monitor.  

## 2023-04-05 NOTE — ED Notes (Signed)
Pt requested medication prior to MRI. Will Uvaldo Rising, PA  notified and verbal order given for 1mg  Ativan IV.

## 2023-04-05 NOTE — Assessment & Plan Note (Signed)
Troponin negative x 2.

## 2023-04-05 NOTE — Consult Note (Signed)
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  States it was radiating to his left arm.  He also complains of left lower extremity edema.  He states he has been taking his Lasix and his Aldactone for his cirrhosis.  He states he was feeling somewhat nauseated and took a dose of Zofran.  Denies any diarrhea.  No fevers.  No right upper quadrant pain.  On arrival temp 97.4 heart rate 69 blood pressure 109/59 satting 98% on room air.  White count 15.7, hemoglobin 8.6, platelets of 73,000 INR 1.9 Sodium 133, potassium 4.5, CO2 25, BUN of 15, creatinine 1.16  Ammonia 24 Total protein 6.8, albumin 2.3, AST 65, ALT of 27, alk phos of 242, total bili 6.9  Lactic acid of 1.3  CTPA was negative for pulmonary embolism.  He had moderate ascites.  CT abdomen pelvis demonstrated moderate ascites.  Right upper quadrant ultrasound demonstrated gallbladder that is distended with small amount of sludge.  No stones.  Negative sonographic Murphy sign.  Common bile duct was 1.3 cm.  MRCP was suggested.  [facility] consulted.   ED Course: Afebrile, WBC 15.7, CBD 13 mm on RUQ U/S. No abd pain on exam  Review of Systems:  Review of Systems  Constitutional:  Negative for chills and fever.  HENT: Negative.    Eyes: Negative.   Respiratory: Negative.    Cardiovascular:  Positive for chest pain and leg swelling.       Substernal CP with radiation to left arm. But  negative troponin x 2  Swelling of left leg  Gastrointestinal:  Positive for nausea. Negative for abdominal pain.  Genitourinary: Negative.   Musculoskeletal: Negative.   Skin: Negative.   Neurological: Negative.   Endo/Heme/Allergies: Negative.   Psychiatric/Behavioral: Negative.    All other systems reviewed and are negative.   Past Medical History:  Diagnosis Date   Cirrhosis (HCC)    Depression    Drug overdose 10/20/2017   Drug overdose, intentional self-harm, initial encounter (HCC) 10/20/2017   Drug overdose, intentional, initial encounter (HCC) 10/20/2017   Hernia, abdominal    Liver failure (HCC)    Seizures (HCC)    Stroke (HCC)    Tobacco abuse     Past Surgical History:  Procedure Laterality Date   ANKLE SURGERY     ANKLE SURGERY Right    HERNIA REPAIR     INCISION AND DRAINAGE OF WOUND Left 02/03/2023   Procedure: IRRIGATION AND DEBRIDEMENT WOUND LEFT RING FINGER POSSIBLE AMPUTATION;  Surgeon: Gomez Cleverly, MD;  Location: [facility];  Service: Orthopedics;  Laterality: Left;   SPLENECTOMY       reports that he has been smoking cigarettes. He has a 30.00 pack-year smoking history. He has quit using smokeless tobacco. He reports that he does not currently use alcohol. He reports current drug use. Drug: Marijuana.  No Known Allergies  Family History  Problem Relation Age of Onset   COPD Mother    Diabetes Father    Stroke  Maternal Grandmother    Alzheimer's disease Maternal Grandfather    Cancer Paternal Grandmother    Stomach cancer Paternal Grandfather    Hypertension Other    Colon cancer Neg Hx    Esophageal cancer Neg Hx    Colon polyps Neg Hx    Inflammatory bowel disease Neg Hx    Liver disease Neg Hx    Pancreatic cancer Neg Hx    Rectal cancer Neg Hx     Prior to Admission medications   Medication Sig Start Date End Date Taking? Authorizing Provider  furosemide (LASIX) 40 MG tablet Take 1 tablet (40 mg total) by mouth daily. 03/02/23  Yes  Rai, Ripudeep K, MD  midodrine (PROAMATINE) 5 MG tablet Take 1 tablet (5 mg total) by mouth 3 (three) times daily with meals. 03/09/23  Yes Rhetta Mura, MD  ondansetron (ZOFRAN-ODT) 4 MG disintegrating tablet Dissolve 1 tablet (4 mg total) by mouth every 8 (eight) hours as needed for nausea or vomiting. 03/02/23  Yes Rai, Ripudeep K, MD  PARoxetine (PAXIL) 20 MG tablet Take 1 tablet (20 mg total) by mouth daily. 03/02/23 05/01/23 Yes Rai, Ripudeep K, MD  QUEtiapine (SEROQUEL) 25 MG tablet Take 1 tablet (25 mg total) by mouth at bedtime. 03/02/23  Yes Rai, Ripudeep K, MD  spironolactone (ALDACTONE) 50 MG tablet Take 1 tablet (50 mg total) by mouth in the morning. 03/02/23  Yes Rai, Ripudeep K, MD  hydrocerin (EUCERIN) CREA Apply 1 Application topically 2 (two) times daily. Apply to right arm Patient not taking: Reported on 04/04/2023 03/02/23   Rai, Delene Ruffini, MD  oxyCODONE-acetaminophen (PERCOCET/ROXICET) 5-325 MG tablet Take 1-2 tablets by mouth every 6 (six) hours as needed for moderate pain or severe pain. Patient not taking: Reported on 04/04/2023 03/02/23   Rai, Delene Ruffini, MD  polyethylene glycol powder (GLYCOLAX/MIRALAX) 17 GM/SCOOP powder Measure 17 grams of powder (see fill line of cap) & mix in 4 oz of liquid/juice & drink by mouth daily as needed for mild or moderate constipation as directed. Patient not taking: Reported on 04/04/2023 03/02/23   Cathren Harsh, MD    Physical Exam: Vitals:   04/05/23 0230 04/05/23 0245 04/05/23 0315 04/05/23 0345  BP: (!) 98/57 100/65 105/60 (!) 95/57  Pulse: 64 62 65 63  Resp: 16 18 11 15   Temp:      TempSrc:      SpO2: 100% 100% 100% 99%  Weight:      Height:        Physical Exam Vitals and nursing note reviewed.  Constitutional:      General: He is not in acute distress.    Appearance: He is not toxic-appearing or diaphoretic.     Comments: Chronically ill appearing  HENT:     Head: Normocephalic and atraumatic.  Eyes:     General: No  scleral icterus. Cardiovascular:     Rate and Rhythm: Normal rate and regular rhythm.     Pulses: Normal pulses.  Pulmonary:     Effort: Pulmonary effort is normal.     Breath sounds: Normal breath sounds.  Abdominal:     General: Abdomen is flat. Bowel sounds are normal. There is no distension.     Palpations: Abdomen is soft.     Tenderness: There is no abdominal tenderness. There is no guarding or rebound. Negative signs include Murphy's sign.     Comments: No RUQ, epigastic tenderness  Musculoskeletal:     Comments: + left thigh and left  lower leg edema  Skin:    General: Skin is warm and dry.     Coloration: Skin is jaundiced.  Neurological:     General: No focal deficit present.     Mental Status: He is oriented to person, place, and time.      Labs on Admission: I have personally reviewed following labs and imaging studies  CBC: Recent Labs  Lab 04/04/23 2236  WBC 15.7*  HGB 8.6*  HCT 23.8*  MCV 103.0*  PLT 73*   Basic Metabolic Panel: Recent Labs  Lab 04/04/23 2236  NA 133*  K 4.5  CL 98  CO2 25  GLUCOSE 104*  BUN 15  CREATININE 1.16  CALCIUM 8.6*   GFR: Estimated Creatinine Clearance: 72 mL/min (by C-G formula based on SCr of 1.16 mg/dL). Liver Function Tests: Recent Labs  Lab 04/05/23 0023  AST 65*  ALT 27  ALKPHOS 242*  BILITOT 6.9*  PROT 6.8  ALBUMIN 2.3*   No results for input(s): "LIPASE", "AMYLASE" in the last 168 hours. Recent Labs  Lab 04/05/23 0023  AMMONIA 24   Coagulation Profile: Recent Labs  Lab 04/04/23 2236  INR 1.9*   Cardiac Enzymes: Recent Labs  Lab 04/04/23 2236 04/05/23 0023  TROPONINIHS 6 6   BNP (last 3 results) Recent Labs    01/22/23 1928  BNP 161.2*   Urine analysis:    Component Value Date/Time   COLORURINE AMBER (A) 01/22/2023 0052   APPEARANCEUR CLEAR 01/22/2023 0052   LABSPEC 1.044 (H) 01/22/2023 0052   PHURINE 5.0 01/22/2023 0052   GLUCOSEU NEGATIVE 01/22/2023 0052   HGBUR NEGATIVE  01/22/2023 0052   BILIRUBINUR NEGATIVE 01/22/2023 0052   KETONESUR NEGATIVE 01/22/2023 0052   PROTEINUR NEGATIVE 01/22/2023 0052   UROBILINOGEN 0.2 06/07/2013 1905   NITRITE NEGATIVE 01/22/2023 0052   LEUKOCYTESUR NEGATIVE 01/22/2023 0052    Radiological Exams on Admission: I have personally reviewed images US Abdomen Limited  Result Date: 04/05/2023 CLINICAL DATA:  Right upper quadrant pain.  History of cirrhosis. EXAM: ULTRASOUND ABDOMEN LIMITED RIGHT UPPER QUADRANT COMPARISON:  03/28/2023. FINDINGS: Gallbladder: The gallbladder is distended and a small amount of sludge is noted. No stones or wall thickening is seen. No sonographic Murphy sign noted by sonographer. Common bile duct: Diameter: 1.3 cm. Liver: No focal lesion identified. Increased parenchymal echogenicity. The liver has a nodular contour, compatible with known cirrhosis. Portal vein is patent on color Doppler imaging with normal direction of blood flow towards the liver. Other: Moderate ascites. IMPRESSION: 1. Distended gallbladder with sludge, suggesting hydrops. No evidence of acute cholecystitis. 2. Distended common bile duct measuring 13 mm. MRCP is suggested for further evaluation. 3. Morphologic changes of cirrhosis. 4. Moderate ascites. Electronically Signed   By: Thornell Sartorius M.D.   On: 04/05/2023 02:23   CT Angio Chest PE W and/or Wo Contrast  Result Date: 04/05/2023 CLINICAL DATA:  Pulmonary embolism suspected, high probability. Abdominal pain, acute, nonlocalized. EXAM: CT ANGIOGRAPHY CHEST CT ABDOMEN AND PELVIS WITH CONTRAST TECHNIQUE: Multidetector CT imaging of the chest was performed using the standard protocol during bolus administration of intravenous contrast. Multiplanar CT image reconstructions and MIPs were obtained to evaluate the vascular anatomy. Multidetector CT imaging of the abdomen and pelvis was performed using the standard protocol during bolus administration of intravenous contrast. RADIATION DOSE  REDUCTION: This exam was performed according to the departmental dose-optimization program which includes automated exposure control, adjustment of the mA and/or kV according to patient size and/or use of iterative  reconstruction technique. CONTRAST:  75mL OMNIPAQUE IOHEXOL 350 MG/ML SOLN COMPARISON:  03/04/2023. FINDINGS: CTA CHEST FINDINGS Cardiovascular: The heart is mildly enlarged and there is no pericardial effusion. Three-vessel coronary artery calcifications are noted. There is atherosclerotic calcification of the aorta without evidence of aneurysm. Pulmonary trunk is mildly distended, suggesting underlying pulmonary artery hypertension. No definite evidence of pulmonary embolism. Mediastinum/Nodes: No enlarged mediastinal, hilar, or axillary lymph nodes. Thyroid gland, trachea, and esophagus demonstrate no significant findings. Lungs/Pleura: There are trace bilateral pleural effusions with atelectasis. No pneumothorax. Musculoskeletal: Gynecomastia is present bilaterally. Degenerative changes are present in the thoracic spine. No acute osseous abnormality. Review of the MIP images confirms the above findings. CT ABDOMEN and PELVIS FINDINGS Hepatobiliary: No focal abnormality in the liver. The liver has a nodular contour, suggesting underlying cirrhosis. A stone is noted in the gallbladder. No biliary ductal dilatation Pancreas: Unremarkable. No pancreatic ductal dilatation or surrounding inflammatory changes. Spleen: Normal in size without focal abnormality. Adrenals/Urinary Tract: Adrenal glands are unremarkable. Kidneys are normal, without renal calculi, focal lesion, or hydronephrosis. Bladder is unremarkable. Stomach/Bowel: There is thickening of the walls of the gastric antrum. Appendix appears normal. No evidence of bowel wall thickening, distention, or inflammatory changes. No free air or pneumatosis. Scattered diverticula are present along the colon without evidence of diverticulitis.  Vascular/Lymphatic: Aortic atherosclerosis. 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 Varices are noted in the anterior abdomen. Prominent lymph nodes are noted in the gastrohepatic ligament and porta hepatis. Reproductive: Prostate gland is normal in size. Other: Moderate ascites is present. There is a midline ventral abdominal wall hernia containing fat and ascites. A left inguinal hernia is noted containing ascites. Musculoskeletal: Degenerative changes are present in the lumbar spine. No acute osseous abnormality. Review of the MIP images confirms the above findings. IMPRESSION: 1. No evidence pulmonary embolism. 2. Trace bilateral pleural effusions with atelectasis. 3. Thickening of the walls of the gastric antrum. Endoscopy is suggested for further evaluation on follow-up. 4. Morphologic changes of cirrhosis and portal hypertension. 5. Moderate ascites. 6. Cholelithiasis. 7. Aortic atherosclerosis and coronary artery calcifications. Electronically Signed   By: Thornell Sartorius M.D.   On: 04/05/2023 01:45   CT ABDOMEN PELVIS W CONTRAST  Result Date: 04/05/2023 CLINICAL DATA:  Pulmonary embolism suspected, high probability. Abdominal pain, acute, nonlocalized. EXAM: CT ANGIOGRAPHY CHEST CT ABDOMEN AND PELVIS WITH CONTRAST TECHNIQUE: Multidetector CT imaging of the chest was performed using the standard protocol during bolus administration of intravenous contrast. Multiplanar CT image reconstructions and MIPs were obtained to evaluate the vascular anatomy. Multidetector CT imaging of the abdomen and pelvis was performed using the standard protocol during bolus administration of intravenous contrast. RADIATION DOSE REDUCTION: This exam was performed according to the departmental dose-optimization program which includes automated exposure control, adjustment of the mA and/or kV according to patient size and/or use of iterative reconstruction technique. CONTRAST:  75mL OMNIPAQUE IOHEXOL 350 MG/ML SOLN  COMPARISON:  03/04/2023. FINDINGS: CTA CHEST FINDINGS Cardiovascular: The heart is mildly enlarged and there is no pericardial effusion. Three-vessel coronary artery calcifications are noted. There is atherosclerotic calcification of the aorta without evidence of aneurysm. Pulmonary trunk is mildly distended, suggesting underlying pulmonary artery hypertension. No definite evidence of pulmonary embolism. Mediastinum/Nodes: No enlarged mediastinal, hilar, or axillary lymph nodes. Thyroid gland, trachea, and esophagus demonstrate no significant findings. Lungs/Pleura: There are trace bilateral pleural effusions with atelectasis. No pneumothorax. Musculoskeletal: Gynecomastia is present bilaterally. Degenerative changes are present in the thoracic spine. No acute osseous abnormality. Review of  the MIP images confirms the above findings. CT ABDOMEN and PELVIS FINDINGS Hepatobiliary: No focal abnormality in the liver. The liver has a nodular contour, suggesting underlying cirrhosis. A stone is noted in the gallbladder. No biliary ductal dilatation Pancreas: Unremarkable. No pancreatic ductal dilatation or surrounding inflammatory changes. Spleen: Normal in size without focal abnormality. Adrenals/Urinary Tract: Adrenal glands are unremarkable. Kidneys are normal, without renal calculi, focal lesion, or hydronephrosis. Bladder is unremarkable. Stomach/Bowel: There is thickening of the walls of the gastric antrum. Appendix appears normal. No evidence of bowel wall thickening, distention, or inflammatory changes. No free air or pneumatosis. Scattered diverticula are present along the colon without evidence of diverticulitis. Vascular/Lymphatic: Aortic atherosclerosis. 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 Varices are noted in the anterior abdomen. Prominent lymph nodes are noted in the gastrohepatic ligament and porta hepatis. Reproductive: Prostate gland is normal in size. Other: Moderate ascites is present.  There is a midline ventral abdominal wall hernia containing fat and ascites. A left inguinal hernia is noted containing ascites. Musculoskeletal: Degenerative changes are present in the lumbar spine. No acute osseous abnormality. Review of the MIP images confirms the above findings. IMPRESSION: 1. No evidence pulmonary embolism. 2. Trace bilateral pleural effusions with atelectasis. 3. Thickening of the walls of the gastric antrum. Endoscopy is suggested for further evaluation on follow-up. 4. Morphologic changes of cirrhosis and portal hypertension. 5. Moderate ascites. 6. Cholelithiasis. 7. Aortic atherosclerosis and coronary artery calcifications. Electronically Signed   By: Thornell Sartorius M.D.   On: 04/05/2023 01:45   DG Chest 2 View  Result Date: 04/04/2023 CLINICAL DATA:  Chest pain EXAM: CHEST - 2 VIEW COMPARISON:  12/04/2022 FINDINGS: Mild cardiomegaly. No acute airspace disease or effusion. No pneumothorax IMPRESSION: No active cardiopulmonary disease. Mild cardiomegaly. Electronically Signed   By: Jasmine Pang M.D.   On: 04/04/2023 23:24    EKG: My personal interpretation of EKG shows: NSR    Assessment/Plan Active Problems:   Common bile duct dilatation   Alcoholic cirrhosis of liver with ascites (HCC)   Leg edema, left   Hyperbilirubinemia   Thrombocytopenia (HCC)   Non-cardiac chest pain    Assessment and Plan: Common bile duct dilatation Agree with proceeding with MRCP to evaluate for common bile duct stone/obstruction.  Continuing IV Zosyn for now is reasonable.  If MRCP shows he has an obstruction/stricture/choledocholithiasis, he will need admission for ERCP and possible cholecystectomy.  If his MRCP is negative for obstruction/stricture/choledocholithiasis, he can be discharged home.  Leg edema, left Agree with proceeding with left lower leg ultrasound.  Patient was intolerant to Lovenox when he was discharged last month which caused him to have bleeding from the gums.   Patient is repeat ultrasound of his left upper extremity was negative for DVT and hematology thought the patient would not need anticoagulation.  Alcoholic cirrhosis of liver with ascites (HCC) Chronic.  Has moderate ascites on CT scan.  Non-cardiac chest pain Troponin negative x 2.  Thrombocytopenia (HCC) Chronic.  Secondary to patient's history of alcoholic cirrhosis.  Hyperbilirubinemia Chronic.  Due to patient's history of alcoholic cirrhosis.    Admission status:  admission deferred for now ,  needs MRCP and left LE U/S.  If MRCP shows obstruction/stricture/stone, then pt will need to be admitted for ERCP.  If LE U/S shows DVT, he would need admission for heme/onc consultation due to prior hx of bleeding from gums while on lovenox.   Carollee Herter, DO [facility] 04/05/2023, 4:08 AM

## 2023-04-05 NOTE — Assessment & Plan Note (Signed)
Agree with proceeding with left lower leg ultrasound.  Patient was intolerant to Lovenox when he was discharged last month which caused him to have bleeding from the gums.  Patient is repeat ultrasound of his left upper extremity was negative for DVT and hematology thought the patient would not need anticoagulation.

## 2023-04-05 NOTE — Telephone Encounter (Signed)
Call from [place], [place] in ED. Patient presented there with chest pain, nausea, LLE edema. CTA chest negative for PE.   Liver chemistries chronically elevated but a little worse today. US showed enlarged CBD, distended gallbladder with stones. [facility] consulted, ordered MRCP . Plan was if MRCP negative for CBD stones patient was to be discharged from ED   MRCP negative for CBD stones, + moderate ascites .  ED requesting outpatient GI follow up and possibly therapeutic paracentesis. I told him our office would contact the patient.   Chart Review:  I did a cursory review of his chart.   Patient known to Dr. Meridee Score. He has a history of anxiety / depression / suicidal ideation / substance abuse / medical non-compliance / cirrhosis.   Last seen by [place] in the office in Sept 2023. Scheduled for EGD / colonoscopy but didn't show.   Evaluated by [place] in the hospital 3/30 for management of diuretics. At that time, per our note, ascites appeared well controlled on diuretics. Advised to follow up outpatient. He didn't show for 4/16 appt.   Today:   Leukocytosis noted on today's labs, WBC 15K.   Alk phos and Tbili a little higher than previous ( alk phos 144 >> 242  / Bili 4.4 >> 6.9). However, bilirubin predominantly indirect.   CT scan shows a stone in gallbladder, Thickening of the walls of the gastric antrum, cirrhosis with portal HTN, moderate ascites  MRCP was a degraded study but there was no gallbladder wall edema. There was mild dilatation of the CBD without definite choledocholithiasis. CBD tapers gradually into the ampulla, moderate ascites.   Will send this note to Dr. Meridee Score but evaluation / treatment has been complicated by medical non-compliance. I do not think we have any openings for a soon appt.

## 2023-04-05 NOTE — ED Notes (Signed)
Patient transported to vascular. 

## 2023-04-05 NOTE — ED Notes (Signed)
Patient transported to MRI 

## 2023-04-05 NOTE — Progress Notes (Signed)
VASCULAR LAB    Left lower extremity venous duplex has been performed.  See CV proc for preliminary results.  Called verbal results to Fayrene Helper, PA-C  Chika Cichowski, RVT 04/05/2023, 7:58 AM

## 2023-04-05 NOTE — Subjective & Objective (Signed)
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  States it was radiating to his left arm.  He also complains of left lower extremity edema.  He states he has been taking his Lasix and his Aldactone for his cirrhosis.  He states he was feeling somewhat nauseated and took a dose of Zofran.  Denies any diarrhea.  No fevers.  No right upper quadrant pain.  On arrival temp 97.4 heart rate 69 blood pressure 109/59 satting 98% on room air.  White count 15.7, hemoglobin 8.6, platelets of 73,000 INR 1.9 Sodium 133, potassium 4.5, CO2 25, BUN of 15, creatinine 1.16  Ammonia 24 Total protein 6.8, albumin 2.3, AST 65, ALT of 27, alk phos of 242, total bili 6.9  Lactic acid of 1.3  CTPA was negative for pulmonary embolism.  He had moderate ascites.  CT abdomen pelvis demonstrated moderate ascites.  Right upper quadrant ultrasound demonstrated gallbladder that is distended with small amount of sludge.  No stones.  Negative sonographic Murphy sign.  Common bile duct was 1.3 cm.  MRCP was suggested.  [facility] consulted.

## 2023-04-05 NOTE — ED Notes (Signed)
Patient returned from MRI at this time.  

## 2023-04-05 NOTE — Assessment & Plan Note (Signed)
Chronic.  Secondary to patient's history of alcoholic cirrhosis.

## 2023-04-05 NOTE — Discharge Instructions (Signed)
You have been evaluated for your symptoms.  Your symptoms likely due to fluid buildup from ascites.  Your stomach specialist office will contact you with an appointment for therapeutic paracentesis.  Follow-up closely with your doctor for further care.  Return to the ER if your condition worsen or if you have other concern.

## 2023-04-05 NOTE — Assessment & Plan Note (Addendum)
Agree with proceeding with MRCP to evaluate for common bile duct stone/obstruction.  Check lipase. Continuing IV Zosyn for now is reasonable.  If MRCP shows he has an obstruction/stricture/choledocholithiasis, he will need admission for ERCP and possible cholecystectomy.  If his MRCP is negative for obstruction/stricture/choledocholithiasis, he can be discharged home.

## 2023-04-05 NOTE — ED Provider Notes (Signed)
[place] EMERGENCY DEPARTMENT AT [facility] Provider Note   CSN: [id number] Arrival date & time: 04/04/23  2220     History  Chief Complaint  Patient presents with   Chest Pain    Nathaniel Warren is a 57 y.o. male.  HPI   Patient with medical history including CVA, seizures, cirrhosis secondary to alcohol use, presenting with complaints of chest pain, states he feels chest pressure middle of his chest, radiates up up his chest and then down into his left arm, states he felt short of breath and had nausea and vomiting, states his symptoms have improved but still remain there.  States he was resting when this happened.  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  Had in the past.  No associated fevers chills cough congestion, denies any stomach pains denies any bloody emesis or coffee-ground emesis no history of esophagus varices.    Home Medications Prior to Admission medications   Medication Sig Start Date End Date Taking? Authorizing Provider  furosemide (LASIX) 40 MG tablet Take 1 tablet (40 mg total) by mouth daily. 03/02/23  Yes Rai, Ripudeep K, MD  midodrine (PROAMATINE) 5 MG tablet Take 1 tablet (5 mg total) by mouth 3 (three) times daily with meals. 03/09/23  Yes Rhetta Mura, MD  ondansetron (ZOFRAN-ODT) 4 MG disintegrating tablet Dissolve 1 tablet (4 mg total) by mouth every 8 (eight) hours as needed for nausea or vomiting. 03/02/23  Yes Rai, Ripudeep K, MD  PARoxetine (PAXIL) 20 MG tablet Take 1 tablet (20 mg total) by mouth daily. 03/02/23 05/01/23 Yes Rai, Ripudeep K, MD  QUEtiapine (SEROQUEL) 25 MG tablet Take 1 tablet (25 mg total) by mouth at bedtime. 03/02/23  Yes Rai, Ripudeep K, MD  spironolactone (ALDACTONE) 50 MG tablet Take 1 tablet (50 mg total) by mouth in  the morning. 03/02/23  Yes Rai, Ripudeep K, MD  hydrocerin (EUCERIN) CREA Apply 1 Application topically 2 (two) times daily. Apply to right arm Patient not taking: Reported on 04/04/2023 03/02/23   Rai, Delene Ruffini, MD  oxyCODONE-acetaminophen (PERCOCET/ROXICET) 5-325 MG tablet Take 1-2 tablets by mouth every 6 (six) hours as needed for moderate pain or severe pain. Patient not taking: Reported on 04/04/2023 03/02/23   Rai, Delene Ruffini, MD  polyethylene glycol powder (GLYCOLAX/MIRALAX) 17 GM/SCOOP powder Measure 17 grams of powder (see fill line of cap) & mix in 4 oz of liquid/juice & drink by mouth daily as needed for mild or moderate constipation as directed. Patient not taking: Reported on 04/04/2023 03/02/23   Cathren Harsh, MD      Allergies    Patient has no known allergies.    Review of Systems   Review of Systems  Constitutional:  Negative for chills and fever.  Respiratory:  Negative for shortness of breath.   Cardiovascular:  Positive for leg swelling. Negative for chest pain.  Gastrointestinal:  Negative for abdominal pain.  Neurological:  Negative for headaches.    Physical Exam Updated Vital Signs BP (!) 91/57   Pulse 73   Temp 98.1 F (36.7 C) (Oral)   Resp 13   Ht 5\' 9"  (1.753 m)   Wt 81.6 kg   SpO2 95%   BMI 26.58 kg/m  Physical Exam Vitals and nursing  note reviewed.  Constitutional:      General: He is not in acute distress.    Appearance: He is not ill-appearing.  HENT:     Head: Normocephalic and atraumatic.     Nose: No congestion.     Mouth/Throat:     Mouth: Mucous membranes are moist.  Eyes:     Conjunctiva/sclera: Conjunctivae normal.  Cardiovascular:     Rate and Rhythm: Normal rate and regular rhythm.     Pulses: Normal pulses.     Heart sounds: No murmur heard.    No friction rub. No gallop.  Pulmonary:     Effort: No respiratory distress.     Breath sounds: No wheezing, rhonchi or rales.  Abdominal:     Palpations: Abdomen is soft.      Tenderness: There is no abdominal tenderness. There is no right CVA tenderness or left CVA tenderness.  Musculoskeletal:     Comments: Patient has obvious left upper thigh swelling, tight feeling, with focalizing usual pain left posterior aspect of the thigh, no palpable cords, patient has 2+ dorsal pedal pulses, no calf tenderness, sensation intact to light touch, 2-second capillary refill.  No evidence of infection in the lower extremities.  Skin:    General: Skin is warm and dry.     Comments: Slightly jaundiced appearing, patient states this is his baseline.  Neurological:     Mental Status: He is alert.  Psychiatric:        Mood and Affect: Mood normal.     ED Results / Procedures / Treatments   Labs (all labs ordered are listed, but only abnormal results are displayed) Labs Reviewed  BASIC METABOLIC PANEL - Abnormal; Notable for the following components:      Result Value   Sodium 133 (*)    Glucose, Bld 104 (*)    Calcium 8.6 (*)    All other components within normal limits  CBC - Abnormal; Notable for the following components:   WBC 15.7 (*)    RBC 2.31 (*)    Hemoglobin 8.6 (*)    HCT 23.8 (*)    MCV 103.0 (*)    MCH 37.2 (*)    MCHC 36.1 (*)    RDW 16.2 (*)    Platelets 73 (*)    All other components within normal limits  PROTIME-INR - Abnormal; Notable for the following components:   Prothrombin Time 21.4 (*)    INR 1.9 (*)    All other components within normal limits  HEPATIC FUNCTION PANEL - Abnormal; Notable for the following components:   Albumin 2.3 (*)    AST 65 (*)    Alkaline Phosphatase 242 (*)    Total Bilirubin 6.9 (*)    Bilirubin, Direct 2.8 (*)    Indirect Bilirubin 4.1 (*)    All other components within normal limits  AMMONIA  LACTIC ACID, PLASMA  LACTIC ACID, PLASMA  BRAIN NATRIURETIC PEPTIDE  LIPASE, BLOOD  TROPONIN I (HIGH SENSITIVITY)  TROPONIN I (HIGH SENSITIVITY)    EKG EKG Interpretation  Date/Time:  Saturday April 04 2023  22:28:51 EDT Ventricular Rate:  63 PR Interval:  134 QRS Duration: 101 QT Interval:  458 QTC Calculation: 469 R Axis:   -51 Text Interpretation: Sinus rhythm Left anterior fascicular block When compared with ECG of 01/22/2023, No significant change was found Confirmed by Dione Booze ([id number]) on 04/05/2023 5:10:38 AM  Radiology US Abdomen Limited  Result Date: 04/05/2023 CLINICAL DATA:  Right upper quadrant pain.  History of cirrhosis. EXAM: ULTRASOUND ABDOMEN LIMITED RIGHT UPPER QUADRANT COMPARISON:  03/28/2023. FINDINGS: Gallbladder: The gallbladder is distended and a small amount of sludge is noted. No stones or wall thickening is seen. No sonographic Murphy sign noted by sonographer. Common bile duct: Diameter: 1.3 cm. Liver: No focal lesion identified. Increased parenchymal echogenicity. The liver has a nodular contour, compatible with known cirrhosis. Portal vein is patent on color Doppler imaging with normal direction of blood flow towards the liver. Other: Moderate ascites. IMPRESSION: 1. Distended gallbladder with sludge, suggesting hydrops. No evidence of acute cholecystitis. 2. Distended common bile duct measuring 13 mm. MRCP is suggested for further evaluation. 3. Morphologic changes of cirrhosis. 4. Moderate ascites. Electronically Signed   By: Thornell Sartorius M.D.   On: 04/05/2023 02:23   CT Angio Chest PE W and/or Wo Contrast  Result Date: 04/05/2023 CLINICAL DATA:  Pulmonary embolism suspected, high probability. Abdominal pain, acute, nonlocalized. EXAM: CT ANGIOGRAPHY CHEST CT ABDOMEN AND PELVIS WITH CONTRAST TECHNIQUE: Multidetector CT imaging of the chest was performed using the standard protocol during bolus administration of intravenous contrast. Multiplanar CT image reconstructions and MIPs were obtained to evaluate the vascular anatomy. Multidetector CT imaging of the abdomen and pelvis was performed using the standard protocol during bolus administration of intravenous contrast.  RADIATION DOSE REDUCTION: This exam was performed according to the departmental dose-optimization program which includes automated exposure control, adjustment of the mA and/or kV according to patient size and/or use of iterative reconstruction technique. CONTRAST:  75mL OMNIPAQUE IOHEXOL 350 MG/ML SOLN COMPARISON:  03/04/2023. FINDINGS: CTA CHEST FINDINGS Cardiovascular: The heart is mildly enlarged and there is no pericardial effusion. Three-vessel coronary artery calcifications are noted. There is atherosclerotic calcification of the aorta without evidence of aneurysm. Pulmonary trunk is mildly distended, suggesting underlying pulmonary artery hypertension. No definite evidence of pulmonary embolism. Mediastinum/Nodes: No enlarged mediastinal, hilar, or axillary lymph nodes. Thyroid gland, trachea, and esophagus demonstrate no significant findings. Lungs/Pleura: There are trace bilateral pleural effusions with atelectasis. No pneumothorax. Musculoskeletal: Gynecomastia is present bilaterally. Degenerative changes are present in the thoracic spine. No acute osseous abnormality. Review of the MIP images confirms the above findings. CT ABDOMEN and PELVIS FINDINGS Hepatobiliary: No focal abnormality in the liver. The liver has a nodular contour, suggesting underlying cirrhosis. A stone is noted in the gallbladder. No biliary ductal dilatation Pancreas: Unremarkable. No pancreatic ductal dilatation or surrounding inflammatory changes. Spleen: Normal in size without focal abnormality. Adrenals/Urinary Tract: Adrenal glands are unremarkable. Kidneys are normal, without renal calculi, focal lesion, or hydronephrosis. Bladder is unremarkable. Stomach/Bowel: There is thickening of the walls of the gastric antrum. Appendix appears normal. No evidence of bowel wall thickening, distention, or inflammatory changes. No free air or pneumatosis. Scattered diverticula are present along the colon without evidence of diverticulitis.  Vascular/Lymphatic: Aortic atherosclerosis. 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 Varices are noted in the anterior abdomen. Prominent lymph nodes are noted in the gastrohepatic ligament and porta hepatis. Reproductive: Prostate gland is normal in size. Other: Moderate ascites is present. There is a midline ventral abdominal wall hernia containing fat and ascites. A left inguinal hernia is noted containing ascites. Musculoskeletal: Degenerative changes are present in the lumbar spine. No acute osseous abnormality. Review of the MIP images confirms the above findings. IMPRESSION: 1. No evidence pulmonary embolism. 2. Trace bilateral pleural effusions with atelectasis. 3. Thickening of the walls of the gastric antrum. Endoscopy is suggested for further evaluation on follow-up. 4. Morphologic changes of cirrhosis and portal  hypertension. 5. Moderate ascites. 6. Cholelithiasis. 7. Aortic atherosclerosis and coronary artery calcifications. Electronically Signed   By: Thornell Sartorius M.D.   On: 04/05/2023 01:45   CT ABDOMEN PELVIS W CONTRAST  Result Date: 04/05/2023 CLINICAL DATA:  Pulmonary embolism suspected, high probability. Abdominal pain, acute, nonlocalized. EXAM: CT ANGIOGRAPHY CHEST CT ABDOMEN AND PELVIS WITH CONTRAST TECHNIQUE: Multidetector CT imaging of the chest was performed using the standard protocol during bolus administration of intravenous contrast. Multiplanar CT image reconstructions and MIPs were obtained to evaluate the vascular anatomy. Multidetector CT imaging of the abdomen and pelvis was performed using the standard protocol during bolus administration of intravenous contrast. RADIATION DOSE REDUCTION: This exam was performed according to the departmental dose-optimization program which includes automated exposure control, adjustment of the mA and/or kV according to patient size and/or use of iterative reconstruction technique. CONTRAST:  75mL OMNIPAQUE IOHEXOL 350 MG/ML SOLN  COMPARISON:  03/04/2023. FINDINGS: CTA CHEST FINDINGS Cardiovascular: The heart is mildly enlarged and there is no pericardial effusion. Three-vessel coronary artery calcifications are noted. There is atherosclerotic calcification of the aorta without evidence of aneurysm. Pulmonary trunk is mildly distended, suggesting underlying pulmonary artery hypertension. No definite evidence of pulmonary embolism. Mediastinum/Nodes: No enlarged mediastinal, hilar, or axillary lymph nodes. Thyroid gland, trachea, and esophagus demonstrate no significant findings. Lungs/Pleura: There are trace bilateral pleural effusions with atelectasis. No pneumothorax. Musculoskeletal: Gynecomastia is present bilaterally. Degenerative changes are present in the thoracic spine. No acute osseous abnormality. Review of the MIP images confirms the above findings. CT ABDOMEN and PELVIS FINDINGS Hepatobiliary: No focal abnormality in the liver. The liver has a nodular contour, suggesting underlying cirrhosis. A stone is noted in the gallbladder. No biliary ductal dilatation Pancreas: Unremarkable. No pancreatic ductal dilatation or surrounding inflammatory changes. Spleen: Normal in size without focal abnormality. Adrenals/Urinary Tract: Adrenal glands are unremarkable. Kidneys are normal, without renal calculi, focal lesion, or hydronephrosis. Bladder is unremarkable. Stomach/Bowel: There is thickening of the walls of the gastric antrum. Appendix appears normal. No evidence of bowel wall thickening, distention, or inflammatory changes. No free air or pneumatosis. Scattered diverticula are present along the colon without evidence of diverticulitis. Vascular/Lymphatic: Aortic atherosclerosis. 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 Varices are noted in the anterior abdomen. Prominent lymph nodes are noted in the gastrohepatic ligament and porta hepatis. Reproductive: Prostate gland is normal in size. Other: Moderate ascites is present.  There is a midline ventral abdominal wall hernia containing fat and ascites. A left inguinal hernia is noted containing ascites. Musculoskeletal: Degenerative changes are present in the lumbar spine. No acute osseous abnormality. Review of the MIP images confirms the above findings. IMPRESSION: 1. No evidence pulmonary embolism. 2. Trace bilateral pleural effusions with atelectasis. 3. Thickening of the walls of the gastric antrum. Endoscopy is suggested for further evaluation on follow-up. 4. Morphologic changes of cirrhosis and portal hypertension. 5. Moderate ascites. 6. Cholelithiasis. 7. Aortic atherosclerosis and coronary artery calcifications. Electronically Signed   By: Thornell Sartorius M.D.   On: 04/05/2023 01:45   DG Chest 2 View  Result Date: 04/04/2023 CLINICAL DATA:  Chest pain EXAM: CHEST - 2 VIEW COMPARISON:  12/04/2022 FINDINGS: Mild cardiomegaly. No acute airspace disease or effusion. No pneumothorax IMPRESSION: No active cardiopulmonary disease. Mild cardiomegaly. Electronically Signed   By: Jasmine Pang M.D.   On: 04/04/2023 23:24    Procedures Procedures    Medications Ordered in ED Medications  lactated ringers infusion (has no administration in time range)  diazepam (VALIUM) injection 5  mg (5 mg Intravenous Given 04/05/23 0022)  HYDROmorphone (DILAUDID) injection 1 mg (1 mg Intravenous Given 04/05/23 0118)  iohexol (OMNIPAQUE) 350 MG/ML injection 75 mL (75 mLs Intravenous Contrast Given 04/05/23 0119)  sodium chloride 0.9 % bolus 1,000 mL (0 mLs Intravenous Stopped 04/05/23 0356)  piperacillin-tazobactam (ZOSYN) IVPB 3.375 g (0 g Intravenous Stopped 04/05/23 0356)  LORazepam (ATIVAN) injection 1 mg (1 mg Intravenous Given 04/05/23 0533)  gadobutrol (GADAVIST) 1 MMOL/ML injection 10 mL (10 mLs Intravenous Contrast Given 04/05/23 1610)    ED Course/ Medical Decision Making/ A&P                             Medical Decision Making Amount and/or Complexity of Data Reviewed Labs:  ordered. Radiology: ordered.  Risk Prescription drug management. Decision regarding hospitalization.   This patient presents to the ED for concern of chest pain, this involves an extensive number of treatment options, and is a complaint that carries with it a high risk of complications and morbidity.  The differential diagnosis includes PE, dissection, ACS    Additional history obtained:  Additional history obtained from N/A External records from outside source obtained and reviewed including recent hospitalization   Co morbidities that complicate the patient evaluation  Cirrhosis, DVT  Social Determinants of Health:  N/a    Lab Tests:  I Ordered, and personally interpreted labs.  The pertinent results include: CBC shows leukocytosis of 15.7, macrocytic anemia hemoglobin 8.6 at baseline, BMP reveals sodium 133 glucose 104 calcium 8.6, if her troponin is 6, hepatic panel shows alk phos of 242, total T. bili is 6.9, indirect 4.1 direct 2.8 ammonia 24   Imaging Studies ordered:  I ordered imaging studies including chest x-ray, CTA of chest, CT on pelvis I independently visualized and interpreted imaging which showed chest x-ray shows cardiomegaly, CT scan was negative for PE, does show thickening of the gastric antrum.  Ultrasound shows some gallbladder sludge without acute cholecystitis, distended common bile duct measuring 13 mm. I agree with the radiologist interpretation   Cardiac Monitoring:  The patient was maintained on a cardiac monitor.  I personally viewed and interpreted the cardiac monitored which showed an underlying rhythm of: EKG without signs of ischemia   Medicines ordered and prescription drug management:  I ordered medication including Valium I have reviewed the patients home medicines and have made adjustments as needed  Critical Interventions:  N/A   Reevaluation:  Presenting with complaint of chest pain, triage obtain basic lab work and  imaging, patient has a notable leukocytosis of 15.7, slightly jaundiced appearing, there is a noted left leg swelling, I am concerned for possible DVT and now he has a possible PE, versus large clot in the IVC, will obtain CTA chest rule out of PE, and proceed with CT abdomen pelvis for further evaluation.  Will provide with Valium  Patient has elevated total T. bili, he appears jaundiced on my exam, will further assess with right upper quadrant ultrasound.  Ultrasound shows distention of the common bile duct, he does have leukocytosis, but he has no right upper quadrant tenderness, manage patient for acute cholangitis is low at this time, but will start him on antibiotics direct admission at this time he is agreement this plan  Consultations Obtained:  I requested consultation with the Dr. Johny Drilling,  and discussed lab and imaging findings as well as pertinent plan - they recommend: Recommends MRCP if showing obstructive pathology will need  admission as well as ERCP.  If negative patient can likely be discharged home.    Test Considered:  N/a    Rule out I have low suspicion for ACS as history is atypical, patient has no cardiac history, EKG was sinus rhythm without signs of ischemia, patient had negative delta troponin.  Low suspicion PE, dissection, AAA CT imaging negative for these findings.  I doubt thrombosis of the IVC is CT imaging is negative for these findings.  Suspicion for limb ischemia of the left leg is also low as sensation is intact to light touch, has 2+ dorsal pedal pulses, leg is warm to the touch, presentation CT of etiology.  Low suspicion for systemic infection as patient is nontoxic-appearing, vital signs reassuring, no obvious source infection noted on exam.     Dispostion and problem list  Due to shift change patient be handed off to Fayrene Helper, PA-C  Common bile duct dilation-follow-up on MRCP, if unremarkable I would consult with GI as he does have an elevated T.  bili for further recommendations.  If the MRCP is positive we will need admission to [facility] with GI consultation. Left leg pain-follow-up on DVT study, and treat accordingly.            Final Clinical Impression(s) / ED Diagnoses Final diagnoses:  Choledocholithiasis  Precordial chest pain    Rx / DC Orders ED Discharge Orders     None         Carroll Sage, PA-C 04/05/23 0640    Gilda Crease, MD 04/05/23 [phone]

## 2023-04-05 NOTE — ED Notes (Signed)
Patient is resting comfortably. 

## 2023-04-05 NOTE — Assessment & Plan Note (Signed)
Chronic.  Has moderate ascites on CT scan.

## 2023-04-05 NOTE — Assessment & Plan Note (Signed)
Chronic.  Due to patient's history of alcoholic cirrhosis.

## 2023-04-06 ENCOUNTER — Other Ambulatory Visit (HOSPITAL_COMMUNITY): Payer: Self-pay

## 2023-04-06 ENCOUNTER — Other Ambulatory Visit: Payer: Self-pay

## 2023-04-06 ENCOUNTER — Telehealth: Payer: Self-pay

## 2023-04-06 DIAGNOSIS — K7031 Alcoholic cirrhosis of liver with ascites: Secondary | ICD-10-CM

## 2023-04-06 NOTE — Telephone Encounter (Signed)
Pt is requesting a pain medication to last him until he comes in to see you on 04/13/23.  Pt was just in the ER. Please advise [facility]

## 2023-04-06 NOTE — Telephone Encounter (Signed)
The order for paracentesis has been entered and sent to the schedulers  Labs have been entered  Appt made to see GM on 05/27/23 at 930 am

## 2023-04-06 NOTE — Telephone Encounter (Signed)
The pt has been advised of the appt, lab order and advised that the schedulers will call to set up paracentesis.

## 2023-04-06 NOTE — Telephone Encounter (Signed)
Thank you follow-up for taking this call. Unfortunately with him missing appointments, this is not ideal and we will work on trying to optimize him as best we can. If he is scheduled for another clinic visit but does not show up, he will be discharged from our practice. Here is the plan of action: 1) CBC/CMP/INR/direct bilirubin to be obtained in the next few days 2) abdominal ultrasound with ascites tap to be performed by interventional radiology (no more than 5 L to be removed; albumin, total protein, fluid cell count, fluid culture to be obtained) 3) 25 g albumin to be administered at time of ascites tap 4) set up for follow-up next available with APP or myself in clinic  Thanks. GM

## 2023-04-06 NOTE — Transitions of Care (Post Inpatient/ED Visit) (Cosign Needed)
   04/06/2023  Name: Nathaniel Warren MRN: [id number] DOB: 1966-07-02  Today's TOC FU Call Status: Today's TOC FU Call Status:: Successful TOC FU Call Competed TOC FU Call Complete Date: 04/06/23  Transition Care Management Follow-up Telephone Call Date of Discharge: 04/05/23 Discharge Facility[facility]) Type of Discharge: Emergency Department Reason for ED Visit: Orthopedic Conditions How have you been since you were released from the hospital?: Worse Any questions or concerns?: No  Items Reviewed: Did you receive and understand the discharge instructions provided?: Yes Medications obtained and verified?: Yes (Medications Reviewed) Any new allergies since your discharge?: No Dietary orders reviewed?: NA Do you have support at home?: Yes People in Home: friend(s)  Home Care and Equipment/Supplies: Were Home Health Services Ordered?: NA Any new equipment or medical supplies ordered?: NA  Functional Questionnaire: Do you need assistance with bathing/showering or dressing?: No Do you need assistance with meal preparation?: No Do you need assistance with eating?: No Do you have difficulty maintaining continence: No Do you need assistance with getting out of bed/getting out of a chair/moving?: No Do you have difficulty managing or taking your medications?: No  Follow up appointments reviewed: PCP Follow-up appointment confirmed?: Yes Date of PCP follow-up appointment?: 04/13/23 Follow-up Provider: Nicola Girt Follow-up appointment confirmed?: NA Do you need transportation to your follow-up appointment?: No Do you understand care options if your condition(s) worsen?: Yes-patient verbalized understanding    SIGNATURE Renelda Loma RMA

## 2023-04-07 ENCOUNTER — Ambulatory Visit: Payer: Medicaid Other | Admitting: Neurology

## 2023-04-09 ENCOUNTER — Telehealth: Payer: Self-pay | Admitting: Internal Medicine

## 2023-04-09 ENCOUNTER — Other Ambulatory Visit: Payer: Self-pay

## 2023-04-09 ENCOUNTER — Encounter (HOSPITAL_COMMUNITY): Payer: Self-pay

## 2023-04-09 ENCOUNTER — Emergency Department (HOSPITAL_COMMUNITY): Payer: Medicaid Other

## 2023-04-09 ENCOUNTER — Inpatient Hospital Stay (HOSPITAL_COMMUNITY)
Admission: EM | Admit: 2023-04-09 | Discharge: 2023-04-12 | DRG: 433 | Disposition: A | Discharge status: Home / Self Care | Payer: Medicaid Other | Attending: Internal Medicine | Admitting: Internal Medicine

## 2023-04-09 DIAGNOSIS — D689 Coagulation defect, unspecified: Secondary | ICD-10-CM | POA: Diagnosis present

## 2023-04-09 DIAGNOSIS — K922 Gastrointestinal hemorrhage, unspecified: Secondary | ICD-10-CM | POA: Diagnosis not present

## 2023-04-09 DIAGNOSIS — K766 Portal hypertension: Secondary | ICD-10-CM | POA: Diagnosis present

## 2023-04-09 DIAGNOSIS — F17210 Nicotine dependence, cigarettes, uncomplicated: Secondary | ICD-10-CM | POA: Diagnosis present

## 2023-04-09 DIAGNOSIS — F419 Anxiety disorder, unspecified: Secondary | ICD-10-CM | POA: Diagnosis present

## 2023-04-09 DIAGNOSIS — Z8249 Family history of ischemic heart disease and other diseases of the circulatory system: Secondary | ICD-10-CM

## 2023-04-09 DIAGNOSIS — I272 Pulmonary hypertension, unspecified: Secondary | ICD-10-CM | POA: Diagnosis present

## 2023-04-09 DIAGNOSIS — W1830XA Fall on same level, unspecified, initial encounter: Secondary | ICD-10-CM | POA: Diagnosis present

## 2023-04-09 DIAGNOSIS — N400 Enlarged prostate without lower urinary tract symptoms: Secondary | ICD-10-CM | POA: Diagnosis present

## 2023-04-09 DIAGNOSIS — Z8673 Personal history of transient ischemic attack (TIA), and cerebral infarction without residual deficits: Secondary | ICD-10-CM

## 2023-04-09 DIAGNOSIS — Z91199 Patient's noncompliance with other medical treatment and regimen due to unspecified reason: Secondary | ICD-10-CM

## 2023-04-09 DIAGNOSIS — Z820 Family history of epilepsy and other diseases of the nervous system: Secondary | ICD-10-CM

## 2023-04-09 DIAGNOSIS — D500 Iron deficiency anemia secondary to blood loss (chronic): Secondary | ICD-10-CM | POA: Diagnosis present

## 2023-04-09 DIAGNOSIS — S7012XA Contusion of left thigh, initial encounter: Secondary | ICD-10-CM | POA: Diagnosis present

## 2023-04-09 DIAGNOSIS — Z825 Family history of asthma and other chronic lower respiratory diseases: Secondary | ICD-10-CM

## 2023-04-09 DIAGNOSIS — R55 Syncope and collapse: Secondary | ICD-10-CM | POA: Diagnosis not present

## 2023-04-09 DIAGNOSIS — E7220 Disorder of urea cycle metabolism, unspecified: Secondary | ICD-10-CM | POA: Diagnosis present

## 2023-04-09 DIAGNOSIS — K7030 Alcoholic cirrhosis of liver without ascites: Secondary | ICD-10-CM | POA: Diagnosis not present

## 2023-04-09 DIAGNOSIS — Z7901 Long term (current) use of anticoagulants: Secondary | ICD-10-CM

## 2023-04-09 DIAGNOSIS — F32A Depression, unspecified: Secondary | ICD-10-CM | POA: Diagnosis present

## 2023-04-09 DIAGNOSIS — Z823 Family history of stroke: Secondary | ICD-10-CM

## 2023-04-09 DIAGNOSIS — R195 Other fecal abnormalities: Secondary | ICD-10-CM | POA: Diagnosis present

## 2023-04-09 DIAGNOSIS — Z716 Tobacco abuse counseling: Secondary | ICD-10-CM

## 2023-04-09 DIAGNOSIS — D72819 Decreased white blood cell count, unspecified: Secondary | ICD-10-CM | POA: Diagnosis present

## 2023-04-09 DIAGNOSIS — K7460 Unspecified cirrhosis of liver: Secondary | ICD-10-CM

## 2023-04-09 DIAGNOSIS — D638 Anemia in other chronic diseases classified elsewhere: Secondary | ICD-10-CM | POA: Diagnosis present

## 2023-04-09 DIAGNOSIS — D696 Thrombocytopenia, unspecified: Secondary | ICD-10-CM | POA: Diagnosis present

## 2023-04-09 DIAGNOSIS — H8110 Benign paroxysmal vertigo, unspecified ear: Secondary | ICD-10-CM | POA: Diagnosis present

## 2023-04-09 DIAGNOSIS — L89152 Pressure ulcer of sacral region, stage 2: Secondary | ICD-10-CM | POA: Diagnosis present

## 2023-04-09 DIAGNOSIS — Z79899 Other long term (current) drug therapy: Secondary | ICD-10-CM

## 2023-04-09 DIAGNOSIS — Z833 Family history of diabetes mellitus: Secondary | ICD-10-CM

## 2023-04-09 DIAGNOSIS — I9589 Other hypotension: Secondary | ICD-10-CM | POA: Diagnosis present

## 2023-04-09 DIAGNOSIS — Z800 Family history of malignant neoplasm of digestive organs: Secondary | ICD-10-CM

## 2023-04-09 DIAGNOSIS — Z720 Tobacco use: Secondary | ICD-10-CM | POA: Diagnosis present

## 2023-04-09 DIAGNOSIS — Z9151 Personal history of suicidal behavior: Secondary | ICD-10-CM

## 2023-04-09 DIAGNOSIS — T148XXA Other injury of unspecified body region, initial encounter: Secondary | ICD-10-CM

## 2023-04-09 LAB — CBC WITH DIFFERENTIAL/PLATELET
Abs Immature Granulocytes: 0.11 10*3/uL — ABNORMAL HIGH (ref 0.00–0.07)
Basophils Absolute: 0.1 10*3/uL (ref 0.0–0.1)
Basophils Relative: 1 %
Eosinophils Absolute: 0.4 10*3/uL (ref 0.0–0.5)
Eosinophils Relative: 3 %
HCT: 27.1 % — ABNORMAL LOW (ref 39.0–52.0)
Hemoglobin: 9.2 g/dL — ABNORMAL LOW (ref 13.0–17.0)
Immature Granulocytes: 1 %
Lymphocytes Relative: 29 %
Lymphs Abs: 3.5 10*3/uL (ref 0.7–4.0)
MCH: 36.9 pg — ABNORMAL HIGH (ref 26.0–34.0)
MCHC: 33.9 g/dL (ref 30.0–36.0)
MCV: 108.8 fL — ABNORMAL HIGH (ref 80.0–100.0)
Monocytes Absolute: 1.5 10*3/uL — ABNORMAL HIGH (ref 0.1–1.0)
Monocytes Relative: 13 %
Neutro Abs: 6.4 10*3/uL (ref 1.7–7.7)
Neutrophils Relative %: 53 %
Platelets: 106 10*3/uL — ABNORMAL LOW (ref 150–400)
RBC: 2.49 MIL/uL — ABNORMAL LOW (ref 4.22–5.81)
RDW: 19.3 % — ABNORMAL HIGH (ref 11.5–15.5)
WBC: 11.9 10*3/uL — ABNORMAL HIGH (ref 4.0–10.5)
nRBC: 0.2 % (ref 0.0–0.2)

## 2023-04-09 LAB — LIPASE, BLOOD: Lipase: 67 U/L — ABNORMAL HIGH (ref 11–51)

## 2023-04-09 LAB — POC OCCULT BLOOD, ED: Fecal Occult Bld: POSITIVE — AB

## 2023-04-09 LAB — COMPREHENSIVE METABOLIC PANEL
ALT: 24 U/L (ref 0–44)
AST: 64 U/L — ABNORMAL HIGH (ref 15–41)
Albumin: 2.5 g/dL — ABNORMAL LOW (ref 3.5–5.0)
Alkaline Phosphatase: 206 U/L — ABNORMAL HIGH (ref 38–126)
Anion gap: 9 (ref 5–15)
BUN: 13 mg/dL (ref 6–20)
CO2: 23 mmol/L (ref 22–32)
Calcium: 8.3 mg/dL — ABNORMAL LOW (ref 8.9–10.3)
Chloride: 100 mmol/L (ref 98–111)
Creatinine, Ser: 0.88 mg/dL (ref 0.61–1.24)
GFR, Estimated: >60 mL/min (ref >60)
Glucose, Bld: 94 mg/dL (ref 70–99)
Potassium: 3.9 mmol/L (ref 3.5–5.1)
Sodium: 132 mmol/L — ABNORMAL LOW (ref 135–145)
Total Bilirubin: 10.6 mg/dL — ABNORMAL HIGH (ref 0.3–1.2)
Total Protein: 6.9 g/dL (ref 6.5–8.1)

## 2023-04-09 LAB — TYPE AND SCREEN
ABO/RH(D): O POS
Antibody Screen: NEGATIVE

## 2023-04-09 LAB — PROTIME-INR
INR: 1.8 — ABNORMAL HIGH (ref 0.8–1.2)
Prothrombin Time: 20.7 seconds — ABNORMAL HIGH (ref 11.4–15.2)

## 2023-04-09 LAB — AMMONIA: Ammonia: 22 umol/L (ref 9–35)

## 2023-04-09 LAB — MAGNESIUM: Magnesium: 2 mg/dL (ref 1.7–2.4)

## 2023-04-09 MED ORDER — MIDODRINE HCL 5 MG PO TABS
5.0000 mg | ORAL_TABLET | Freq: Three times a day (TID) | ORAL | Status: DC
  Administered 2023-04-09 – 2023-04-12 (×8): 5 mg via ORAL
  Filled 2023-04-09 (×10): qty 1

## 2023-04-09 MED ORDER — MELATONIN 3 MG PO TABS: 3.0000 mg | ORAL_TABLET | Freq: Every evening | ORAL | Status: DC | PRN

## 2023-04-09 MED ORDER — ACETAMINOPHEN 650 MG RE SUPP: 650.0000 mg | Freq: Four times a day (QID) | RECTAL | Status: DC | PRN

## 2023-04-09 MED ORDER — ACETAMINOPHEN 325 MG PO TABS: 650.0000 mg | ORAL_TABLET | Freq: Four times a day (QID) | ORAL | Status: DC | PRN

## 2023-04-09 MED ORDER — OXYCODONE HCL 5 MG PO TABS
5.0000 mg | ORAL_TABLET | Freq: Once | ORAL | Status: AC
  Administered 2023-04-09: 5 mg via ORAL
  Filled 2023-04-09: qty 1

## 2023-04-09 MED ORDER — ONDANSETRON HCL 4 MG/2ML IJ SOLN: 4.0000 mg | Freq: Four times a day (QID) | INTRAMUSCULAR | Status: DC | PRN

## 2023-04-09 MED ORDER — SODIUM CHLORIDE 0.9 % IV SOLN
1.0000 g | Freq: Once | INTRAVENOUS | Status: AC
  Administered 2023-04-09: 1 g via INTRAVENOUS
  Filled 2023-04-09: qty 10

## 2023-04-09 MED ORDER — PANTOPRAZOLE 80MG IVPB - SIMPLE MED
80.0000 mg | Freq: Once | INTRAVENOUS | Status: AC
  Administered 2023-04-09: 80 mg via INTRAVENOUS
  Filled 2023-04-09: qty 80

## 2023-04-09 NOTE — Telephone Encounter (Signed)
Thanks Nathaniel Warren. [name], please follow-up tomorrow if the patient is not admitted and find out how he is doing. Thanks. GM

## 2023-04-09 NOTE — ED Triage Notes (Signed)
C/o hematoma to inside of left thigh with swelling x1 day.  Hx cirrhosis.  Denies fall/ trauma/injury Denies blood thinner usage.

## 2023-04-09 NOTE — Telephone Encounter (Signed)
Has developed a large L thigh and groin hematoma - had similar issue in feb  Advised go to ED at [facility]  He is slated for paracentesis in IR tomorrow - he says and noj that swollen and doesn't think he needs that  I told him could be sorted out after he is eeen at ED - suspect he will be admitted

## 2023-04-09 NOTE — ED Provider Notes (Signed)
[place] EMERGENCY DEPARTMENT AT [facility] Provider Note   CSN: [id number] Arrival date & time: 04/09/23  1817     History  Chief Complaint  Patient presents with   Leg Pain    Nathaniel Warren is a 57 y.o. male.  57 year old male with prior medical history as detailed below presents for evaluation.  Patient complains of painful hematoma to the medial aspect of the left thigh and into the posterior aspect of the left thigh.  Denies any specific injury or event leading to this hematoma.  He first noticed hematoma approximate 24 hours ago.  He denies current anticoagulation use.  Denies recent trauma.  Patient does admit to having a brief syncopal event that occurred yesterday.  Patient reports that while at home he stood and then had brief syncope.  Patient reports that this does not happen regularly.  He denies striking his head.  He denies headache.  Denies neck pain.  He denies injury related to his brief syncopal event.   The history is provided by the patient and medical records.       Home Medications Prior to Admission medications   Medication Sig Start Date End Date Taking? Authorizing Provider  furosemide (LASIX) 40 MG tablet Take 1 tablet (40 mg total) by mouth daily. 03/02/23   Rai, Delene Ruffini, MD  hydrocerin (EUCERIN) CREA Apply 1 Application topically 2 (two) times daily. Apply to right arm Patient not taking: Reported on 04/04/2023 03/02/23   Rai, Delene Ruffini, MD  midodrine (PROAMATINE) 5 MG tablet Take 1 tablet (5 mg total) by mouth 3 (three) times daily with meals. 03/09/23   Rhetta Mura, MD  ondansetron (ZOFRAN) 4 MG tablet Take 1 tablet (4 mg total) by mouth every 8 (eight) hours as needed for nausea or vomiting. 04/05/23   Fayrene Helper, PA-C  ondansetron (ZOFRAN-ODT) 4 MG disintegrating tablet Dissolve 1 tablet (4 mg total) by mouth every 8 (eight) hours as needed for nausea or vomiting. Patient not taking: Reported on 04/06/2023 03/02/23   Rai,  Delene Ruffini, MD  oxyCODONE-acetaminophen (PERCOCET/ROXICET) 5-325 MG tablet Take 1-2 tablets by mouth every 6 (six) hours as needed for moderate pain or severe pain. Patient not taking: Reported on 04/04/2023 03/02/23   Rai, Delene Ruffini, MD  PARoxetine (PAXIL) 20 MG tablet Take 1 tablet (20 mg total) by mouth daily. 03/02/23 05/01/23  Rai, Delene Ruffini, MD  polyethylene glycol powder (GLYCOLAX/MIRALAX) 17 GM/SCOOP powder Measure 17 grams of powder (see fill line of cap) & mix in 4 oz of liquid/juice & drink by mouth daily as needed for mild or moderate constipation as directed. Patient not taking: Reported on 04/04/2023 03/02/23   Rai, Delene Ruffini, MD  QUEtiapine (SEROQUEL) 25 MG tablet Take 1 tablet (25 mg total) by mouth at bedtime. 03/02/23   Rai, Delene Ruffini, MD  spironolactone (ALDACTONE) 50 MG tablet Take 1 tablet (50 mg total) by mouth in the morning. 03/02/23   Cathren Harsh, MD      Allergies    Patient has no known allergies.    Review of Systems   Review of Systems  All other systems reviewed and are negative.   Physical Exam Updated Vital Signs BP 118/72 (BP Location: Right Arm)   Pulse 69   Temp 98.3 F (36.8 C) (Oral)   Resp 14   SpO2 95%  Physical Exam Vitals and nursing note reviewed.  Constitutional:      General: He is not in acute distress.  Appearance: Normal appearance. He is well-developed.  HENT:     Head: Normocephalic and atraumatic.  Eyes:     Conjunctiva/sclera: Conjunctivae normal.     Pupils: Pupils are equal, round, and reactive to light.  Cardiovascular:     Rate and Rhythm: Normal rate and regular rhythm.     Heart sounds: Normal heart sounds.  Pulmonary:     Effort: Pulmonary effort is normal. No respiratory distress.     Breath sounds: Normal breath sounds.  Abdominal:     General: There is no distension.     Palpations: Abdomen is soft.     Tenderness: There is no abdominal tenderness.  Musculoskeletal:        General: No deformity. Normal range  of motion.     Cervical back: Normal range of motion and neck supple.  Skin:    General: Skin is warm and dry.     Comments: Large hematoma to the medial and posterior aspect of the left thigh and groin.  No overlying erythema.  No crepitus.  Distal left lower extremity is neurovascular intact.  Neurological:     General: No focal deficit present.     Mental Status: He is alert and oriented to person, place, and time.     ED Results / Procedures / Treatments   Labs (all labs ordered are listed, but only abnormal results are displayed) Labs Reviewed  CBC WITH DIFFERENTIAL/PLATELET - Abnormal; Notable for the following components:      Result Value   WBC 11.9 (*)    RBC 2.49 (*)    Hemoglobin 9.2 (*)    HCT 27.1 (*)    MCV 108.8 (*)    MCH 36.9 (*)    RDW 19.3 (*)    Platelets 106 (*)    Monocytes Absolute 1.5 (*)    Abs Immature Granulocytes 0.11 (*)    All other components within normal limits  POC OCCULT BLOOD, ED - Abnormal; Notable for the following components:   Fecal Occult Bld POSITIVE (*)    All other components within normal limits  PROTIME-INR  COMPREHENSIVE METABOLIC PANEL  LIPASE, BLOOD  AMMONIA  TYPE AND SCREEN    EKG EKG Interpretation  Date/Time:  Thursday Apr 09 2023 20:47:34 EDT Ventricular Rate:  63 PR Interval:  131 QRS Duration: 102 QT Interval:  471 QTC Calculation: 483 R Axis:   -41 Text Interpretation: Sinus rhythm Left anterior fascicular block Abnormal R-wave progression, early transition Borderline prolonged QT interval Confirmed by Kristine Royal [phone]) on 04/09/2023 8:48:30 PM  Radiology No results found.  Procedures Procedures    Medications Ordered in ED Medications  pantoprazole (PROTONIX) 80 mg /NS 100 mL IVPB (has no administration in time range)  cefTRIAXone (ROCEPHIN) 1 g in sodium chloride 0.9 % 100 mL IVPB (has no administration in time range)    ED Course/ Medical Decision Making/ A&P Clinical Course as of  04/09/23 2055  Thu Apr 09, 2023  2048 EKG 12-Lead [PM]    Clinical Course User Index [PM] Wynetta Fines, MD                             Medical Decision Making Amount and/or Complexity of Data Reviewed Labs: ordered. Radiology: ordered. ECG/medicine tests:  Decision-making details documented in ED Course.  Risk Prescription drug management. Decision regarding hospitalization.    Medical Screen Complete  This patient presented to the ED with complaint of hematoma,  left thigh.  This complaint involves an extensive number of treatment options. The initial differential diagnosis includes, but is not limited to, coagulopathy, bleeding, metabolic abnormality, etc  This presentation is: Acute, Chronic, Self-Limited, Previously Undiagnosed, Uncertain Prognosis, Complicated, Systemic Symptoms, and Threat to Life/Bodily Function  Cirrhosis presents with hematoma, ecchymosis to the left thigh. Direct cause of hematoma unclear.  Patient also reports brief syncope occurred yesterday.  Patient significant liver dysfunction.  Patient with positive stool Hemoccult.  No melena noted.  Patient with stable hemodynamics.  INR is 1.8.  Hemoglobin is 9.2.  Total bili is 10.6.  Patient will benefit from observation and likely GI consultation in AM.  Patient is known to Dr. Meridee Score. Message sent to his service for GI consult in AM.   Hospitalist service is aware of case and will evaluate.  Additional history obtained:  External records from outside sources obtained and reviewed including prior ED visits and prior Inpatient records.    Lab Tests:  I ordered and personally interpreted labs.  The pertinent results include: CBC, CMP, ammonia, lipase,, stool Hemoccult,   Imaging Studies ordered:  I ordered imaging studies including left femur  I independently visualized and interpreted obtained imaging which showed NAD I agree with the radiologist interpretation.   Cardiac  Monitoring:  The patient was maintained on a cardiac monitor.  I personally viewed and interpreted the cardiac monitor which showed an underlying rhythm of: NSR   Medicines ordered:  I ordered medication including protonix, rocephin  for suspected GI bleeding  Reevaluation of the patient after these medicines showed that the patient: improved  Problem List / ED Course:  Left thigh hematoma, positive stool hemoccult   Reevaluation:  After the interventions noted above, I reevaluated the patient and found that they have: stayed the same  Disposition:  After consideration of the diagnostic results and the patients response to treatment, I feel that the patent would benefit from admission.          Final Clinical Impression(s) / ED Diagnoses Final diagnoses:  Hepatic cirrhosis, unspecified hepatic cirrhosis type, unspecified whether ascites present [facility])  Gastrointestinal hemorrhage, unspecified gastrointestinal hemorrhage type  Hematoma    Rx / DC Orders ED Discharge Orders     None         Wynetta Fines, MD 04/09/23 2252

## 2023-04-10 ENCOUNTER — Observation Stay (HOSPITAL_COMMUNITY): Payer: Medicaid Other

## 2023-04-10 ENCOUNTER — Inpatient Hospital Stay (HOSPITAL_COMMUNITY)
Admission: RE | Admit: 2023-04-10 | Discharge: 2023-04-10 | Disposition: A | Discharge status: Home / Self Care | Payer: Medicaid Other | Source: Ambulatory Visit | Attending: Gastroenterology | Admitting: Gastroenterology

## 2023-04-10 DIAGNOSIS — H8113 Benign paroxysmal vertigo, bilateral: Secondary | ICD-10-CM | POA: Diagnosis not present

## 2023-04-10 DIAGNOSIS — K922 Gastrointestinal hemorrhage, unspecified: Secondary | ICD-10-CM | POA: Diagnosis not present

## 2023-04-10 DIAGNOSIS — T148XXA Other injury of unspecified body region, initial encounter: Secondary | ICD-10-CM

## 2023-04-10 DIAGNOSIS — K7031 Alcoholic cirrhosis of liver with ascites: Secondary | ICD-10-CM

## 2023-04-10 DIAGNOSIS — K7460 Unspecified cirrhosis of liver: Secondary | ICD-10-CM | POA: Diagnosis not present

## 2023-04-10 DIAGNOSIS — D649 Anemia, unspecified: Secondary | ICD-10-CM

## 2023-04-10 DIAGNOSIS — D638 Anemia in other chronic diseases classified elsewhere: Secondary | ICD-10-CM | POA: Diagnosis present

## 2023-04-10 DIAGNOSIS — R195 Other fecal abnormalities: Secondary | ICD-10-CM

## 2023-04-10 DIAGNOSIS — S7012XA Contusion of left thigh, initial encounter: Secondary | ICD-10-CM | POA: Diagnosis present

## 2023-04-10 LAB — COMPREHENSIVE METABOLIC PANEL
ALT: 22 U/L (ref 0–44)
AST: 57 U/L — ABNORMAL HIGH (ref 15–41)
Albumin: 2 g/dL — ABNORMAL LOW (ref 3.5–5.0)
Alkaline Phosphatase: 150 U/L — ABNORMAL HIGH (ref 38–126)
Anion gap: 7 (ref 5–15)
BUN: 12 mg/dL (ref 6–20)
CO2: 24 mmol/L (ref 22–32)
Calcium: 8.2 mg/dL — ABNORMAL LOW (ref 8.9–10.3)
Chloride: 103 mmol/L (ref 98–111)
Creatinine, Ser: 0.75 mg/dL (ref 0.61–1.24)
GFR, Estimated: >60 mL/min (ref >60)
Glucose, Bld: 90 mg/dL (ref 70–99)
Potassium: 3.9 mmol/L (ref 3.5–5.1)
Sodium: 134 mmol/L — ABNORMAL LOW (ref 135–145)
Total Bilirubin: 8.9 mg/dL — ABNORMAL HIGH (ref 0.3–1.2)
Total Protein: 5.9 g/dL — ABNORMAL LOW (ref 6.5–8.1)

## 2023-04-10 LAB — IRON AND TIBC
Iron: 102 ug/dL (ref 45–182)
Saturation Ratios: 70 % — ABNORMAL HIGH (ref 17.9–39.5)
TIBC: 146 ug/dL — ABNORMAL LOW (ref 250–450)
UIBC: 44 ug/dL

## 2023-04-10 LAB — CBC WITH DIFFERENTIAL/PLATELET
Abs Immature Granulocytes: 0 10*3/uL (ref 0.00–0.07)
Basophils Absolute: 0 10*3/uL (ref 0.0–0.1)
Basophils Relative: 0 %
Eosinophils Absolute: 0.3 10*3/uL (ref 0.0–0.5)
Eosinophils Relative: 3 %
HCT: 22.5 % — ABNORMAL LOW (ref 39.0–52.0)
Hemoglobin: 7.7 g/dL — ABNORMAL LOW (ref 13.0–17.0)
Lymphocytes Relative: 22 %
Lymphs Abs: 2.3 10*3/uL (ref 0.7–4.0)
MCH: 37 pg — ABNORMAL HIGH (ref 26.0–34.0)
MCHC: 34.2 g/dL (ref 30.0–36.0)
MCV: 108.2 fL — ABNORMAL HIGH (ref 80.0–100.0)
Monocytes Absolute: 0.2 10*3/uL (ref 0.1–1.0)
Monocytes Relative: 2 %
Neutro Abs: 7.5 10*3/uL (ref 1.7–7.7)
Neutrophils Relative %: 73 %
Platelets: 100 10*3/uL — ABNORMAL LOW (ref 150–400)
RBC: 2.08 MIL/uL — ABNORMAL LOW (ref 4.22–5.81)
RDW: 19.7 % — ABNORMAL HIGH (ref 11.5–15.5)
WBC: 10.3 10*3/uL (ref 4.0–10.5)
nRBC: 0.2 % (ref 0.0–0.2)

## 2023-04-10 LAB — FERRITIN: Ferritin: 555 ng/mL — ABNORMAL HIGH (ref 24–336)

## 2023-04-10 LAB — HEMOGLOBIN AND HEMATOCRIT, BLOOD
HCT: 22.3 % — ABNORMAL LOW (ref 39.0–52.0)
Hemoglobin: 7.8 g/dL — ABNORMAL LOW (ref 13.0–17.0)

## 2023-04-10 LAB — PROTIME-INR
INR: 2.1 — ABNORMAL HIGH (ref 0.8–1.2)
Prothrombin Time: 23.3 seconds — ABNORMAL HIGH (ref 11.4–15.2)

## 2023-04-10 LAB — MAGNESIUM: Magnesium: 2 mg/dL (ref 1.7–2.4)

## 2023-04-10 MED ORDER — PHYTONADIONE 1 MG/0.5 ML ORAL SOLUTION
5.0000 mg | Freq: Every day | ORAL | Status: DC
  Administered 2023-04-10: 5 mg via ORAL
  Filled 2023-04-10 (×2): qty 2.5

## 2023-04-10 MED ORDER — PANTOPRAZOLE SODIUM 40 MG PO TBEC
40.0000 mg | DELAYED_RELEASE_TABLET | Freq: Two times a day (BID) | ORAL | Status: DC
  Administered 2023-04-10 – 2023-04-12 (×4): 40 mg via ORAL
  Filled 2023-04-10 (×4): qty 1

## 2023-04-10 MED ORDER — TRAMADOL HCL 50 MG PO TABS
50.0000 mg | ORAL_TABLET | Freq: Two times a day (BID) | ORAL | Status: DC | PRN
  Administered 2023-04-11: 50 mg via ORAL
  Filled 2023-04-10: qty 1

## 2023-04-10 MED ORDER — PANTOPRAZOLE SODIUM 40 MG IV SOLR
40.0000 mg | Freq: Two times a day (BID) | INTRAVENOUS | Status: DC
  Administered 2023-04-10: 40 mg via INTRAVENOUS
  Filled 2023-04-10: qty 10

## 2023-04-10 MED ORDER — SPIRONOLACTONE 25 MG PO TABS
50.0000 mg | ORAL_TABLET | Freq: Every day | ORAL | Status: DC
  Administered 2023-04-10 – 2023-04-12 (×3): 50 mg via ORAL
  Filled 2023-04-10 (×3): qty 2

## 2023-04-10 MED ORDER — QUETIAPINE FUMARATE 25 MG PO TABS
25.0000 mg | ORAL_TABLET | Freq: Every day | ORAL | Status: DC
  Administered 2023-04-10 – 2023-04-11 (×3): 25 mg via ORAL
  Filled 2023-04-10 (×3): qty 1

## 2023-04-10 MED ORDER — NICOTINE 21 MG/24HR TD PT24: 21.0000 mg | MEDICATED_PATCH | Freq: Every day | TRANSDERMAL | Status: DC | PRN

## 2023-04-10 MED ORDER — PAROXETINE HCL 20 MG PO TABS
20.0000 mg | ORAL_TABLET | Freq: Every day | ORAL | Status: DC
  Administered 2023-04-10 – 2023-04-12 (×3): 20 mg via ORAL
  Filled 2023-04-10 (×3): qty 1

## 2023-04-10 MED ORDER — SODIUM CHLORIDE 0.9 % IV SOLN
1.0000 g | INTRAVENOUS | Status: DC
  Administered 2023-04-10: 1 g via INTRAVENOUS
  Filled 2023-04-10: qty 10

## 2023-04-10 MED ORDER — PANTOPRAZOLE SODIUM 40 MG IV SOLR: 40.0000 mg | Freq: Two times a day (BID) | INTRAVENOUS | Status: DC

## 2023-04-10 MED ORDER — POTASSIUM CHLORIDE CRYS ER 20 MEQ PO TBCR
20.0000 meq | EXTENDED_RELEASE_TABLET | Freq: Once | ORAL | Status: AC
  Administered 2023-04-10: 20 meq via ORAL
  Filled 2023-04-10: qty 1

## 2023-04-10 MED ORDER — FUROSEMIDE 40 MG PO TABS
40.0000 mg | ORAL_TABLET | Freq: Every day | ORAL | Status: DC
  Administered 2023-04-10 – 2023-04-12 (×3): 40 mg via ORAL
  Filled 2023-04-10 (×3): qty 1

## 2023-04-10 NOTE — TOC Progression Note (Signed)
Transition of Care [facility]) - Progression Note    Patient Details  Name: Nathaniel Warren MRN: [id number] Date of Birth: 06-20-1966  Transition of Care [facility]) CM/SW Contact  Beckie Busing, RN Phone Number:[phone]  04/10/2023, 12:24 PM  Clinical Narrative:     Transition of Care [facility]) Screening Note   Patient Details  Name: Nathaniel Warren Date of Birth: 1966/07/25   Transition of Care [facility]) CM/SW Contact:    Beckie Busing, RN Phone Number: 04/10/2023, 12:24 PM    Transition of Care Department [facility]) has reviewed patient and no TOC needs have been identified at this time. We will continue to monitor patient advancement through interdisciplinary progression rounds. If new patient transition needs arise, please place a TOC consult.          Expected Discharge Plan and Services                                               Social Determinants of Health (SDOH) Interventions SDOH Screenings   Food Insecurity: No Food Insecurity (03/04/2023)  Recent Concern: Food Insecurity - Food Insecurity Present (03/02/2023)  Housing: Low Risk  (03/04/2023)  Recent Concern: Housing - Medium Risk (01/23/2023)  Transportation Needs: No Transportation Needs (03/04/2023)  Recent Concern: Transportation Needs - Unmet Transportation Needs (01/23/2023)  Utilities: At Risk (03/04/2023)  Alcohol Screen: Medium Risk (10/27/2017)  Depression (PHQ2-9): High Risk (12/18/2022)  Financial Resource Strain: High Risk (07/15/2022)  Physical Activity: Inactive (07/15/2022)  Social Connections: Socially Isolated (07/15/2022)  Stress: Stress Concern Present (07/15/2022)  Tobacco Use: High Risk (04/09/2023)    Readmission Risk Interventions     No data to display

## 2023-04-10 NOTE — Evaluation (Signed)
Physical Therapy Evaluation Patient Details Name: Nathaniel Warren MRN: [id number] DOB: [birth year] Today's Date: 04/10/2023  History of Present Illness  Pt is 57 yo male admitted 04/09/23 with syncopal episode.  Of note, pt with hypotension baseline and on midodrine at home.  Pt with hx of alcoholic cirrhosis, depression, anemia from chronic liver disease.  Clinical Impression  Pt admitted with above diagnosis. At baseline, pt is independent but does require use of RW/AD due to R LE injury. PT asked to see pt for vestibular evaluation.  Based on history pt described syncopal episode upon standing but also reports dizziness/spinning when rolling in bed that last 15 seconds.  He does have chronic anemia and hypotension on home midodrine.  Pt did have orthostatic hypotension during evaluation but did not have syncopal symptoms.  Pt did have spinning/dizziness with transition to standing (heavy use of momentum with head movement) and had a positive L hall pike dix consistent with L posterior canal BPPV.  Provided pt with HEP of habituation exercises for gaze stabilization and self epley.  Will benefit from further PT prior to d/c to continue to assess/treat BPPV.   Pt currently with functional limitations due to the deficits listed below (see PT Problem List). Pt will benefit from acute skilled PT to increase their independence and safety with mobility to allow discharge.          Recommendations for follow up therapy are one component of a multi-disciplinary discharge planning process, led by the attending physician.  Recommendations may be updated based on patient status, additional functional criteria and insurance authorization.  Follow Up Recommendations       Assistance Recommended at Discharge Intermittent Supervision/Assistance  Patient can return home with the following  A little help with walking and/or transfers;A little help with bathing/dressing/bathroom;Assistance with cooking/housework     Equipment Recommendations None recommended by PT  Recommendations for Other Services       Functional Status Assessment Patient has had a recent decline in their functional status and demonstrates the ability to make significant improvements in function in a reasonable and predictable amount of time.     Precautions / Restrictions Precautions Precautions: Fall Precaution Comments: orthostatic      Mobility  Bed Mobility Overal bed mobility: Needs Assistance Bed Mobility: Supine to Sit, Sit to Supine     Supine to sit: Supervision Sit to supine: Supervision        Transfers Overall transfer level: Needs assistance Equipment used: Rolling walker (2 wheels) Transfers: Sit to/from Stand Sit to Stand: Min guard, Min assist           General transfer comment: Required min guard to steady.  He required use of momentum and leaning forward to stand (head going forward and down then up to stand) which caused his dizziness and nystagmus.  Required min A to steady for 15 seconds (during nystagmus) then improved.  Denied lightheadeness (despite hypotension) , only had spinning sensation    Ambulation/Gait Ambulation/Gait assistance: Min guard Gait Distance (Feet): 30 Feet Assistive device: Rolling walker (2 wheels) Gait Pattern/deviations: Step-to pattern, Decreased stride length, Decreased weight shift to right, Decreased dorsiflexion - right Gait velocity: decreased     General Gait Details: R ankle inverts and with limited weight shift to right.  Pt denies having brace for ankle.  Discussed even OTC lace up could provide some support  Information systems manager Rankin (Stroke  Patients Only)       Balance Overall balance assessment: Needs assistance Sitting-balance support: No upper extremity supported, Bilateral upper extremity supported Sitting balance-Leahy Scale: Good Sitting balance - Comments: Good -static, but required UE with  initial transition due to dizzy/nystagmus   Standing balance support: Bilateral upper extremity supported Standing balance-Leahy Scale: Poor Standing balance comment: Requiring RW; min A initial stand due to dizzy/nystagmus but then min guard once settled                             Pertinent Vitals/Pain Pain Assessment Pain Assessment: 0-10 Pain Score: 6  Pain Location: bil legs Pain Descriptors / Indicators: Cramping Pain Intervention(s): Limited activity within patient's tolerance, Monitored during session    Home Living Family/patient expects to be discharged to:: Private residence Living Arrangements: Non-relatives/Friends (roommates) Available Help at Discharge: Friend(s);Available PRN/intermittently Type of Home: House Home Access: Stairs to enter Entrance Stairs-Rails: Left Entrance Stairs-Number of Steps: 2   Home Layout: One level Home Equipment: Agricultural consultant (2 wheels);Crutches;Wheelchair - manual;Grab bars - tub/shower;Grab bars - toilet      Prior Function Prior Level of Function : Independent/Modified Independent             Mobility Comments: Reports chronic pain in legs; R leg injury from accident with decreased sensation lateral lower leg and weak ankle -does not wear brace; in home holds walls and furniture ; in community uses RW; Denies falls other than synopal episodes associated with this visit and reports they were falls back onto bed ADLs Comments: Independent adls     Hand Dominance        Extremity/Trunk Assessment   Upper Extremity Assessment Upper Extremity Assessment: Overall WFL for tasks assessed    Lower Extremity Assessment Lower Extremity Assessment: RLE deficits/detail;LLE deficits/detail RLE Deficits / Details: ROM WFL; MMT: ankle 4-/5 and tends to invert, knee 4/5, hip 5/5 LLE Deficits / Details: ROM WFL; MMT 5/5    Cervical / Trunk Assessment Cervical / Trunk Assessment: Normal  Communication   Communication:  No difficulties  Cognition Arousal/Alertness: Awake/alert Behavior During Therapy: WFL for tasks assessed/performed Overall Cognitive Status: Within Functional Limits for tasks assessed                                          General Comments ORTHOSTATIC BP: Pt with chronic hypotension and on home midodrine. Admitted with syncopal/LOC symptoms.  BP were as follows: Supine: 98/57 Sitting 91/54 Standing 78/55 (dizzy 15 sec but room spinning , +nystagmus, not syncopal symptoms) Standind 3 mins 86/54 Sitting post walk 102/51  Discussed does have drop in BP but that dizziness/room spinning not result of that.  Pt has had orthostatic hypotension and described admitted LOC as passing out, turning black etc which is consistent with orthostatic bp.  Educated on slow transitions with AROM exercises (arms and legs)sitting prior to standing and then weight shifting/marching in place in standing prior to walking to make sure not syncopal.  Verbalized understanding.   VESTIBULAR  History: Pt reports long hx w orthostatic hypotension/syncopal symptoms but that new dizziness started about a week ago.  Describes it as room spinning/unstable for ~15 seconds then clears.  States happens when rolls in bed, eyes closed and turns head in bed, transitions to sitting at times, and transitions to standing.  Of note - with  standing heavy use of momentum with head going down then up.  Pt could not thing of anything that caused (no jarring of body or falls).  He is a side sleeper and rotates from L to R.    EOEM/smooth pursuit: intact , no dizziness, no nystagmus Head Thurst: normal Test of Skew: normal Gaze stabilization: some mild dizziness but no nystagmus [facility] Right: negative test but dizzy +nystagmus upon sitting [facility] Left: positive, upward rotational nystagmus lasting 15 seconds along with dizziness; completed Epley maneuvar; less dizziness and no nystagmus with return to  sitting  Educated pt on BPPV and provided with handouts on habituation exercise with gaze stabilization sitting and self Epley.   Exercises     Assessment/Plan    PT Assessment Patient needs continued PT services  PT Problem List Decreased strength;Decreased range of motion;Decreased activity tolerance;Decreased balance;Decreased mobility;Cardiopulmonary status limiting activity       PT Treatment Interventions DME instruction;Therapeutic exercise;Gait training;Balance training;Neuromuscular re-education;Functional mobility training;Therapeutic activities;Patient/family education;Other (comment) (Vestibular (canalith repositioning, habituation))    PT Goals (Current goals can be found in the Care Plan section)  Acute Rehab PT Goals Patient Stated Goal: improve dizziness PT Goal Formulation: With patient Time For Goal Achievement: 04/24/23 Potential to Achieve Goals: Good    Frequency Min 1X/week     Co-evaluation               AM-PAC PT "6 Clicks" Mobility  Outcome Measure Help needed turning from your back to your side while in a flat bed without using bedrails?: None Help needed moving from lying on your back to sitting on the side of a flat bed without using bedrails?: A Little Help needed moving to and from a bed to a chair (including a wheelchair)?: A Little Help needed standing up from a chair using your arms (e.g., wheelchair or bedside chair)?: A Little Help needed to walk in hospital room?: A Little Help needed climbing 3-5 steps with a railing? : A Little 6 Click Score: 19    End of Session Equipment Utilized During Treatment: Gait belt Activity Tolerance: Patient tolerated treatment well Patient left: in bed;with call bell/phone within reach;with bed alarm set Nurse Communication: Mobility status PT Visit Diagnosis: Other abnormalities of gait and mobility (R26.89);Dizziness and giddiness (R42)    Time: 9604-5409 PT Time Calculation (min) (ACUTE ONLY):  38 min   Charges:   PT Evaluation $PT Eval Low Complexity: 1 Low PT Treatments $Therapeutic Activity: 8-22 mins $Canalith Rep Proc: 8-22 mins        Anise Salvo, PT Acute Rehab [facility] [phone]   Rayetta Humphrey 04/10/2023, 5:33 PM

## 2023-04-10 NOTE — Consult Note (Addendum)
Consultation  Referring Provider:    Dr. David Stall Primary Care Physician:  Ivonne Andrew, NP Primary Gastroenterologist:   Dr. Meridee Score      Reason for Consultation: History of alcoholic cirrhosis with Hemoccult positive anemia             HPI:   Nathaniel Warren is a 57 y.o. male with a past medical history as listed below including alcoholic cirrhosis, stroke, depression, anemia of chronic liver disease associated baseline hemoglobin 8.5-10, who was admitted to the [facility] ER on 04/09/2023 after an episode of loss of consciousness.  We are consulted now in regards to Hemoccult positive stool in the setting of anemia.    At time of presentation patient described the experience 1 episode of loss of consciousness about 2 days ago when he was going from sitting to standing.  He apparently fell back onto the bed.  It then happened 1 more time that day.     At time of my interview with the patient he tells me that he did have a couple episodes of syncope where he tried to go from sitting to standing and felt so dizzy that he fell back onto his bed.  This happened twice in one day a couple of days ago and has not happened since.  He does not recall hitting anything when he fell.  Tells me though that what brought him in is that he started with a large bruise on his left thigh and again does not remember hitting anything.  Tells me this has happened before and every time before it happens he has a lot of swelling in that leg and will have a pinpoint bruise and then the next day the swelling will go away and the bruise will spread.      As far as his dizziness/syncope he has been dizzy for the past week and this is worse whenever he moves his head quickly from 1 position to the other, even laying down in bed he can reproduce symptoms.  He tells me he really does not look at his stool on a daily basis but has not noticed it being any different.  Had a soft solid stool yesterday.  Does not feel like his  abdomen is distended anymore than usual, no abdominal pain, no nausea, vomiting, heartburn, reflux.    Does tell me that he stopped drinking a year ago and he was diagnosed with cirrhosis.  He does occasionally smoke weed but does no other drugs.    I did ask if he has missed EGD/colonoscopy appointments and he tells me he has been trying to make an appointment for the past year and has just never been scheduled.    Denies fever, chills, weight loss or symptoms that awaken him from sleep.     ED course: CMP with a sodium of 132 (133 on 4/27), potassium 3.9, BUN 13, creatinine 0.88 alk phos 206, AST 64, ALT 24, total bili 10.6, ammonia 22 (24 on 03/28/2023), lipase 67, INR 1.8, CBC with a hemoglobin of 9.2 (associated macrocytic finding as well as normochromic result) (hemoglobin 8.6 on 04/04/2023).  DRE revealed brown-colored stool was fecal occult positive  GI history: 04/06/2023 Dr. 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 Meridee Score for alcoholic cirrhosis of the liver with ascites: At that time discussed patient had required total of 3 paracenteses over the last 6 months apparently had missed appointments for EGD for variceal screening and colonoscopy, at that time still drinking; plan: Continue Lasix 40 daily and Spironolactone 50 daily, discussed needing EGD and colonoscopy at some point  Past Medical History:  Diagnosis Date   Cirrhosis (HCC)    Depression    Drug overdose 10/20/2017   Drug overdose, intentional self-harm, initial encounter (HCC) 10/20/2017   Drug overdose, intentional, initial encounter (HCC) 10/20/2017   Hernia, abdominal    Liver failure (HCC)    Seizures (HCC)    Stroke (HCC)     Tobacco abuse     Past Surgical History:  Procedure Laterality Date   ANKLE SURGERY     ANKLE SURGERY Right    HERNIA REPAIR     INCISION AND DRAINAGE OF WOUND Left 02/03/2023   Procedure: IRRIGATION AND DEBRIDEMENT WOUND LEFT RING FINGER POSSIBLE AMPUTATION;  Surgeon: Gomez Cleverly, MD;  Location: [facility];  Service: Orthopedics;  Laterality: Left;   SPLENECTOMY      Family History  Problem Relation Age of Onset   COPD Mother    Diabetes Father    Stroke Maternal Grandmother    Alzheimer's disease Maternal Grandfather    Cancer Paternal Grandmother    Stomach cancer Paternal Grandfather    Hypertension Other    Colon cancer Neg Hx    Esophageal cancer Neg Hx    Colon polyps Neg Hx    Inflammatory bowel disease Neg Hx    Liver disease Neg Hx    Pancreatic cancer Neg Hx    Rectal cancer Neg Hx     Social History   Tobacco Use   Smoking status: Every Day    Packs/day: 1.00    Years: 30.00    Additional pack years: 0.00    Total pack years: 30.00    Types: Cigarettes   Smokeless tobacco: Former  Building services engineer Use: Never used  Substance Use Topics   Alcohol use: Not Currently   Drug use: Yes    Types: Marijuana    Prior to Admission medications   Medication Sig Start Date End Date Taking? Authorizing Provider  furosemide (LASIX) 40 MG tablet Take 1 tablet (40 mg total) by mouth daily. 03/02/23  Yes Rai, Ripudeep K, MD  midodrine (PROAMATINE) 5 MG tablet Take 1 tablet (5 mg total) by mouth 3 (three) times daily with meals. 03/09/23  Yes Rhetta Mura, MD  ondansetron (ZOFRAN) 4 MG tablet Take 1 tablet (4 mg total) by mouth every 8 (eight) hours as needed for nausea or vomiting. 04/05/23  Yes Fayrene Helper, PA-C  PARoxetine (PAXIL) 20 MG tablet Take 1 tablet (20 mg total) by mouth daily. 03/02/23 05/01/23 Yes Rai, Ripudeep K, MD  QUEtiapine (SEROQUEL) 25 MG tablet Take 1 tablet (25 mg total) by mouth at bedtime. 03/02/23  Yes Rai, Ripudeep K, MD  spironolactone  (ALDACTONE) 50 MG tablet Take 1 tablet (50 mg total) by mouth in the morning. 03/02/23  Yes Rai, Ripudeep K, MD  hydrocerin (EUCERIN) CREA Apply 1 Application topically 2 (two) times daily. Apply to right arm Patient not taking: Reported on 04/04/2023 03/02/23   Rai, Delene Ruffini, MD  ondansetron (ZOFRAN-ODT) 4 MG disintegrating tablet Dissolve 1 tablet (4 mg total) by mouth every 8 (eight) hours as needed for  nausea or vomiting. Patient not taking: Reported on 04/06/2023 03/02/23   Rai, Delene Ruffini, MD  oxyCODONE-acetaminophen (PERCOCET/ROXICET) 5-325 MG tablet Take 1-2 tablets by mouth every 6 (six) hours as needed for moderate pain or severe pain. Patient not taking: Reported on 04/04/2023 03/02/23   Rai, Delene Ruffini, MD  polyethylene glycol powder (GLYCOLAX/MIRALAX) 17 GM/SCOOP powder Measure 17 grams of powder (see fill line of cap) & mix in 4 oz of liquid/juice & drink by mouth daily as needed for mild or moderate constipation as directed. Patient not taking: Reported on 04/04/2023 03/02/23   Cathren Harsh, MD    Current Facility-Administered Medications  Medication Dose Route Frequency Provider Last Rate Last Admin   acetaminophen (TYLENOL) tablet 650 mg  650 mg Oral Q6H PRN Howerter, Justin B, DO       Or   acetaminophen (TYLENOL) suppository 650 mg  650 mg Rectal Q6H PRN Howerter, Justin B, DO       cefTRIAXone (ROCEPHIN) 1 g in sodium chloride 0.9 % 100 mL IVPB  1 g Intravenous Q24H Howerter, Justin B, DO       furosemide (LASIX) tablet 40 mg  40 mg Oral Daily Marinda Elk, MD       melatonin tablet 3 mg  3 mg Oral QHS PRN Howerter, Justin B, DO       midodrine (PROAMATINE) tablet 5 mg  5 mg Oral TID WC Howerter, Justin B, DO   5 mg at 04/10/23 8657   nicotine (NICODERM CQ - dosed in mg/24 hours) patch 21 mg  21 mg Transdermal Daily PRN Howerter, Justin B, DO       ondansetron (ZOFRAN) injection 4 mg  4 mg Intravenous Q6H PRN Howerter, Justin B, DO       pantoprazole (PROTONIX) injection  40 mg  40 mg Intravenous Q12H Howerter, Justin B, DO       PARoxetine (PAXIL) tablet 20 mg  20 mg Oral Daily Howerter, Justin B, DO       phytonadione (VITAMIN K) oral solution 1 mg/0.5 mL  5 mg Oral Daily Marinda Elk, MD       QUEtiapine (SEROQUEL) tablet 25 mg  25 mg Oral QHS Howerter, Justin B, DO   25 mg at 04/10/23 0040   spironolactone (ALDACTONE) tablet 50 mg  50 mg Oral Daily Marinda Elk, MD       Current Outpatient Medications  Medication Sig Dispense Refill   furosemide (LASIX) 40 MG tablet Take 1 tablet (40 mg total) by mouth daily. 30 tablet 3   midodrine (PROAMATINE) 5 MG tablet Take 1 tablet (5 mg total) by mouth 3 (three) times daily with meals. 90 tablet 0   ondansetron (ZOFRAN) 4 MG tablet Take 1 tablet (4 mg total) by mouth every 8 (eight) hours as needed for nausea or vomiting. 12 tablet 0   PARoxetine (PAXIL) 20 MG tablet Take 1 tablet (20 mg total) by mouth daily. 30 tablet 1   QUEtiapine (SEROQUEL) 25 MG tablet Take 1 tablet (25 mg total) by mouth at bedtime. 30 tablet 1   spironolactone (ALDACTONE) 50 MG tablet Take 1 tablet (50 mg total) by mouth in the morning. 30 tablet 3   hydrocerin (EUCERIN) CREA Apply 1 Application topically 2 (two) times daily. Apply to right arm (Patient not taking: Reported on 04/04/2023) 113 g 0   ondansetron (ZOFRAN-ODT) 4 MG disintegrating tablet Dissolve 1 tablet (4 mg total) by mouth every 8 (eight) hours as needed  for nausea or vomiting. (Patient not taking: Reported on 04/06/2023) 20 tablet 0   oxyCODONE-acetaminophen (PERCOCET/ROXICET) 5-325 MG tablet Take 1-2 tablets by mouth every 6 (six) hours as needed for moderate pain or severe pain. (Patient not taking: Reported on 04/04/2023) 28 tablet 0   polyethylene glycol powder (GLYCOLAX/MIRALAX) 17 GM/SCOOP powder Measure 17 grams of powder (see fill line of cap) & mix in 4 oz of liquid/juice & drink by mouth daily as needed for mild or moderate constipation as directed. (Patient  not taking: Reported on 04/04/2023) 238 g 0    Allergies as of 04/09/2023   (No Known Allergies)     Review of Systems:    Constitutional: No weight loss, fever or chills Skin: No rash  Cardiovascular: No chest pain  Respiratory: No SOB  Gastrointestinal: See HPI and otherwise negative Genitourinary: No dysuria Neurological: No headache Musculoskeletal: No new muscle or joint pain Hematologic: No bleeding  Psychiatric: No history of depression or anxiety    Physical Exam:  Vital signs in last 24 hours: Temp:  [98.1 F (36.7 C)-98.4 F (36.9 C)] 98.2 F (36.8 C) (05/03 0612) Pulse Rate:  [63-86] 74 (05/03 0700) Resp:  [14-21] 18 (05/03 0700) BP: (86-118)/(39-87) 100/56 (05/03 0700) SpO2:  [92 %-100 %] 96 % (05/03 0700)   General:   Pleasant Caucasian male appears to be in NAD, Well developed, Well nourished, alert and cooperative Head:  Normocephalic and atraumatic. Eyes:   PEERL, EOMI. No icterus. Conjunctiva pink. Ears:  Normal auditory acuity. Neck:  Supple Throat: Oral cavity and pharynx without inflammation, swelling or lesion.  Lungs: Respirations even and unlabored. Lungs clear to auscultation bilaterally.   No wheezes, crackles, or rhonchi.  Heart: Normal S1, S2. No MRG. Regular rate and rhythm. No peripheral edema, cyanosis or pallor.  Abdomen:  Soft, nondistended, nontender. No rebound or guarding. Normal bowel sounds. No appreciable masses or hepatomegaly. Rectal:  Not performed.  Msk:  Symmetrical without gross deformities. Extremities:  Without edema, no deformity or joint abnormality.  Neurologic:  Alert and  oriented x4;  grossly normal neurologically.  Skin:   Dry and intact. +large hematoma on right inner thigh and down to calf Psychiatric: Demonstrates good judgement and reason without abnormal affect or behaviors.  LAB RESULTS: Recent Labs    04/09/23 2028 04/10/23 0627 04/10/23 0819  WBC 11.9* 10.3  --   HGB 9.2* 7.7* 7.8*  HCT 27.1* 22.5*  22.3*  PLT 106* 100*  --    BMET Recent Labs    04/09/23 2028 04/10/23 0627  NA 132* 134*  K 3.9 3.9  CL 100 103  CO2 23 24  GLUCOSE 94 90  BUN 13 12  CREATININE 0.88 0.75  CALCIUM 8.3* 8.2*   LFT Recent Labs    04/10/23 0627  PROT 5.9*  ALBUMIN 2.0*  AST 57*  ALT 22  ALKPHOS 150*  BILITOT 8.9*   PT/INR Recent Labs    04/09/23 2028 04/10/23 0627  LABPROT 20.7* 23.3*  INR 1.8* 2.1*    STUDIES: DG Chest Port 1 View  Result Date: 04/10/2023 CLINICAL DATA:  106001 Syncope 106001 EXAM: PORTABLE CHEST 1 VIEW COMPARISON:  Chest x-ray 04/04/2023, CT angio chest 04/05/2023 FINDINGS: The heart and mediastinal contours are unchanged. No focal consolidation. No pulmonary edema. No pleural effusion. No pneumothorax. No acute osseous abnormality. IMPRESSION: No active disease. Electronically Signed   By: Tish Frederickson M.D.   On: 04/10/2023 00:31   DG Femur Min 2 Views Left  Result Date: 04/09/2023 CLINICAL DATA:  Pain and swelling EXAM: LEFT FEMUR 2 VIEWS COMPARISON:  01/22/2023 FINDINGS: No recent fracture or dislocation is seen. There is linear smooth marginated calcification in quadriceps tendon close to patella suggesting calcific tendinosis with no interval change. There is edema in subcutaneous plane. IMPRESSION: No fracture or dislocation is seen in left femur. Electronically Signed   By: Ernie Avena M.D.   On: 04/09/2023 21:26      Impression / Plan:   Impression: 1.  Syncope: Patient had an episode of positional syncope when going from sitting to standing 2 days ago x2, hemoglobin appears around baseline, but Hemoccult positive in the ED with no acute signs of GI bleed, does report some positional dizziness, question possible BPPV?, less likley resulting from this chronic anemia which is at patient's baseline 2.  Hemoccult positive stool: BUN trending down, hemoglobin around baseline 3.  Alcoholic cirrhosis: Patient reports abstinent from alcohol for over a  year, complicated by portal hypertension with recurrent ascites, chronic thrombocytopenia and chronic anemia, currently on Spironolactone and Lasix, MELD 25 4.  Ecchymosis of the left thigh 5.  Depression  Plan: 1.  Patient is due for a EGD for variceal screening and screening colonoscopy but these are nonemergent.  I do not think patient's episode of syncope had anything to do with his chronic anemia.  He is not seeing any acute GI bleeding at home.  Stool is brown.  He does need EGD and colonoscopy in the near future but this could be arranged as an outpatient unless patient starts acutely bleeding here. 2.  Did discuss possibility of BPPV with the patient, question whether he would benefit from the Epley maneuver 3.  Uncertain etiology of thigh hematoma 4.  Continue to monitor hemoglobin with transfusion as needed less than 7 5.  Continue home medications of Lasix 40 mg daily and Aldactone 50 mg daily 6.  Patient will need to follow-up in our outpatient clinic to arrange for EGD and colonoscopy if not done here.    Thank you for your kind consultation, will discuss further with Dr. Chales Abrahams, but we may sign off.  Violet Baldy [facility]  04/10/2023, 8:45 AM    Attending physician's note   I have taken history, reviewed the chart and examined the patient. I performed a substantive portion of this encounter, including complete performance of at least one of the key components, in conjunction with the APP. I agree with the Advanced Practitioner's note, impression and recommendations.    ETOH cirrhosis with portal hypertension. No ETOH since 07/2022 Recurrent ascites on Lasix/Aldactone 40/50 QD. Was scheduled for paracentesis today as outpt Anemia d/t large left thigh ecchymosis.  No overt GI bleeding.  Has H+ stools Jaundice, thrombocytopenia, coagulopathy. No hepatic encephalopathy. Noncompliance H/O anxiety/depression/suicidal ideation/substance abuse-may not be a candidate for liver  transplant. HCC screening- Nl AFP 02/2023, neg MRI 03/2023  Plan: -Low salt diet. -Resume Lasix/Aldactone 40/50 -Hold off on IR paracentesis today-not distended.  Doubt clinically if he would have significant ascitic fluid. -Trend CBC. Keep Hb>7 -Can give Vit K for nutrition related coagulopathy. -He eventually would need EGD for EV screening/screening colon as outpt. To be set up after OV. (Can't be done over wkend d/t scheduling constraints- only emergencies per anesthesia) -FU in APP clinic/Dr Mansouraty as outpt Victorino Dike will arrange) -Will sign off for now. -Pl call if any change in clinical status.    MELD 3.0: 26 at 04/10/2023  6:27 AM MELD-Na: 25 at 04/10/2023  6:27 AM Calculated from: Serum Creatinine: 0.75 mg/dL (Using min of 1 mg/dL) at 12/13/1094  0:45 AM Serum Sodium: 134 mmol/L at 04/10/2023  6:27 AM Total Bilirubin: 8.9 mg/dL at 4/0/9811  9:14 AM Serum Albumin: 2.0 g/dL at 06/15/2955  2:13 AM INR(ratio): 2.1 at 04/10/2023  6:27 AM Age at listing (hypothetical): 57 years Sex: Male at 04/10/2023  6:27 AM    Edman Circle, MD [facility] [phone]

## 2023-04-10 NOTE — Telephone Encounter (Signed)
The pt is currently admitted  

## 2023-04-10 NOTE — Progress Notes (Signed)
PHARMACIST - PHYSICIAN COMMUNICATION  DR:   David Stall  CONCERNING: IV to Oral Route Change Policy  RECOMMENDATION: This patient is receiving Protonix by the intravenous route.  Based on criteria approved by the Pharmacy and Therapeutics Committee, the intravenous medication(s) is/are being converted to the equivalent oral dose form(s).   DESCRIPTION: These criteria include: The patient is eating (either orally or via tube) and/or has been taking other orally administered medications for a least 24 hours The patient has no evidence of active gastrointestinal bleeding or impaired GI absorption (gastrectomy, short bowel, patient on TNA or NPO).  If you have questions about this conversion, please contact the Pharmacy Department  []   [phone] )  [facility] []   [phone] )  [facility] []   [phone] )  [facility] []   [phone] )  [facility] [x]   [phone] )  [facility]   [place], [facility] 04/10/2023 11:23 AM

## 2023-04-10 NOTE — H&P (Signed)
History and Physical      [name]:[id number] DOB: June 04, 1966 DOA: 04/09/2023; DOS: 04/10/2023  PCP: Ivonne Andrew, NP  Patient coming from: home   I have personally briefly reviewed patient's old medical records in [facility]  Chief Complaint: Loss of consciousness  HPI: Nathaniel Warren is a 57 y.o. male with medical history significant for alcoholic cirrhosis, depression, anemia of chronic liver disease associated baseline hemoglobin 8.5-10, who is admitted to [facility] on 04/09/2023 with episode of syncope after presenting from home to [facility] ED complaining of episode of loss of consciousness.   The patient reports that he experienced a single episode of loss of consciousness 2 days ago when he was rising from a seated to standing position.  As he was standing he developed dizziness, lightheadedness, and the sensation of impending loss of consciousness prior to actually losing consciousness.  He fell to the floor, but does believe that he hit his head as a component of this fall.  This was unwitnessed, but the patient believes he was unconscious for only few seconds before regaining consciousness, without any associated tongue biting or tonic-clonic activity nor any loss of bowel/bladder function.  Denies any ensuing episodes of loss consciousness.  Denies any associated, preceding, or ensuing chest pain, shortness of breath, nausea, vomiting.  Denies any recent diarrhea, melena, or hematochezia.  No recent abdominal discomfort that would be new for him.  No recent acute focal weakness, acute focal numbness, paresthesias, vertigo, acute change in vision, dysphagia, slurred speech, facial droop.   He notes that he may have hit his left leg as a component of the fall associated with the above episode of loss of consciousness.  He has noted some mild bruising associate with the left thigh subsequent to this fall.  Denies any associated numbness, paresthesias, acute focal  weakness, nor any diminished temperature associated with the left lower extremity.  He confirms that he is not on any blood thinners as an outpatient, including no aspirin.  His medical history is notable for alcoholic cirrhosis, for which she follows with Dr.**[facility] [place] [facility].  His cirrhosis complicated by portal hypertension for which she is on both spironolactone and Lasix.  He has chronic borderline hypotension for which she is on midodrine at home.  Patient conveys that he has not previously undergone EGD.  Per chart review, his cirrhosis is complicated by chronic thrombocytopenia as well as chronic anemia with baseline hemoglobin range noted to be 8.5-10.   Per chart review, most recent prior liver enzymes were checked on 04/05/2023, and were notable for the following: Alkaline phosphatase 242, AST 65, ALT 27, total bilirubin 6.9.      ED Course:  Vital signs in the ED were notable for the following: Afebrile; heart rates in the 60s to 70s; stop blood pressures in the low 100s to 1 teens; respiratory rate 14-21, oxygen saturation 100% on room air.  Labs were notable for the following: CMP notable for sodium 132 compared to most recent prior serum sodium level of 133 on 04/04/2023, potassium 3.9, bicarbonate 23, BUN 13 compared to most recent prior value of 15 on 04/04/2023, creatinine 0.88, down from 1.16 on 04/04/2023, glucose 94, calcium, just for mild hyperammonemia noted to be 9.5, abdomen 2.5, alkaline phosphatase 206, AST 64, ALT 24, total bilirubin 10.6, ammonia 22 compared to 24 on 03/28/2023, lipase 67 compared to 75 on 12/01/2022.  INR 1.8 compared to most recent prior value of 1.9 on 04/04/2023.  CBC notable for white cell count 11,900 compared to 15.7 on 04/04/2023, hemoglobin 9.2 associated with macrocytic finding as well as normochromic result, and relative demonstration prior hemoglobin data point of 8.6 on 04/04/2023, bili at 106, relative to 73 on 04/04/2023.  Type and screen  ordered.  DRE revealed brown-colored stool but was fecal occult blood positive.  Per my interpretation, EKG in ED demonstrated the following: Sinus rhythm with heart rate 63, normal intervals, no evidence of T wave or ST changes, including no evidence of ST elevation.  Imaging and additional notable ED work-up: Plain films of the left femur showed no evidence of acute fracture or dislocation.  In the context of his history of alcoholic cirrhosis as well as new finding of fecal occult blood positive stool on today's DRE in the setting of presenting syncopal episode, EDP contacted on-call [facility], Dr. Leone Payor, Requesting formal consultation.   While in the ED, the following were administered: Oxycodone 5 mg p.o. x 1, Rocephin, Protonix 80 mg IV x 1.  Subsequently, the patient was admitted for further evaluation management of presenting syncopal episode, with concern for acute lower gastrointestinal bleed, while noting ecchymosis associated with the left thigh.    Review of Systems: As per HPI otherwise 10 point review of systems negative.   Past Medical History:  Diagnosis Date   Cirrhosis (HCC)    Depression    Drug overdose 10/20/2017   Drug overdose, intentional self-harm, initial encounter (HCC) 10/20/2017   Drug overdose, intentional, initial encounter (HCC) 10/20/2017   Hernia, abdominal    Liver failure (HCC)    Seizures (HCC)    Stroke (HCC)    Tobacco abuse     Past Surgical History:  Procedure Laterality Date   ANKLE SURGERY     ANKLE SURGERY Right    HERNIA REPAIR     INCISION AND DRAINAGE OF WOUND Left 02/03/2023   Procedure: IRRIGATION AND DEBRIDEMENT WOUND LEFT RING FINGER POSSIBLE AMPUTATION;  Surgeon: Gomez Cleverly, MD;  Location: [facility];  Service: Orthopedics;  Laterality: Left;   SPLENECTOMY      Social History:  reports that he has been smoking cigarettes. He has a 30.00 pack-year smoking history. He has quit using smokeless tobacco. He  reports that he does not currently use alcohol. He reports current drug use. Drug: Marijuana.   No Known Allergies  Family History  Problem Relation Age of Onset   COPD Mother    Diabetes Father    Stroke Maternal Grandmother    Alzheimer's disease Maternal Grandfather    Cancer Paternal Grandmother    Stomach cancer Paternal Grandfather    Hypertension Other    Colon cancer Neg Hx    Esophageal cancer Neg Hx    Colon polyps Neg Hx    Inflammatory bowel disease Neg Hx    Liver disease Neg Hx    Pancreatic cancer Neg Hx    Rectal cancer Neg Hx     Family history reviewed and not pertinent    Prior to Admission medications   Medication Sig Start Date End Date Taking? Authorizing Provider  furosemide (LASIX) 40 MG tablet Take 1 tablet (40 mg total) by mouth daily. 03/02/23   Rai, Delene Ruffini, MD  hydrocerin (EUCERIN) CREA Apply 1 Application topically 2 (two) times daily. Apply to right arm Patient not taking: Reported on 04/04/2023 03/02/23   Rai, Delene Ruffini, MD  midodrine (PROAMATINE) 5 MG tablet Take 1 tablet (5 mg total) by mouth 3 (three) times  daily with meals. 03/09/23   Rhetta Mura, MD  ondansetron (ZOFRAN) 4 MG tablet Take 1 tablet (4 mg total) by mouth every 8 (eight) hours as needed for nausea or vomiting. 04/05/23   Fayrene Helper, PA-C  ondansetron (ZOFRAN-ODT) 4 MG disintegrating tablet Dissolve 1 tablet (4 mg total) by mouth every 8 (eight) hours as needed for nausea or vomiting. Patient not taking: Reported on 04/06/2023 03/02/23   Rai, Delene Ruffini, MD  oxyCODONE-acetaminophen (PERCOCET/ROXICET) 5-325 MG tablet Take 1-2 tablets by mouth every 6 (six) hours as needed for moderate pain or severe pain. Patient not taking: Reported on 04/04/2023 03/02/23   Rai, Delene Ruffini, MD  PARoxetine (PAXIL) 20 MG tablet Take 1 tablet (20 mg total) by mouth daily. 03/02/23 05/01/23  Rai, Delene Ruffini, MD  polyethylene glycol powder (GLYCOLAX/MIRALAX) 17 GM/SCOOP powder Measure 17 grams of  powder (see fill line of cap) & mix in 4 oz of liquid/juice & drink by mouth daily as needed for mild or moderate constipation as directed. Patient not taking: Reported on 04/04/2023 03/02/23   Rai, Delene Ruffini, MD  QUEtiapine (SEROQUEL) 25 MG tablet Take 1 tablet (25 mg total) by mouth at bedtime. 03/02/23   Rai, Delene Ruffini, MD  spironolactone (ALDACTONE) 50 MG tablet Take 1 tablet (50 mg total) by mouth in the morning. 03/02/23   Cathren Harsh, MD     Objective    Physical Exam: Vitals:   04/09/23 1827 04/09/23 2130 04/09/23 2145 04/09/23 2200  BP: 118/72 109/67 107/69 112/71  Pulse: 69 63 65 65  Resp: 14 15 15  (!) 21  Temp: 98.3 F (36.8 C) 98.1 F (36.7 C)    TempSrc: Oral Oral    SpO2: 95% 100% 100% 100%    General: appears to be stated age; alert, oriented Skin: warm, dry, ecchymosis associated with left thigh ; mild jaundice  Head:  AT/Poth Mouth:  Oral mucosa membranes appear moist, normal dentition Neck: supple; trachea midline Heart:  RRR; did not appreciate any M/R/G Lungs: CTAB, did not appreciate any wheezes, rales, or rhonchi Abdomen: + BS; soft, ND, NT Vascular: 2+ pedal pulses b/l; 2+ radial pulses b/l Extremities: no peripheral edema, no muscle wasting; ecchymosis of left thigh Neuro: strength and sensation intact in upper and lower extremities b/l    Labs on Admission: I have personally reviewed following labs and imaging studies  CBC: Recent Labs  Lab 04/04/23 2236 04/09/23 2028  WBC 15.7* 11.9*  NEUTROABS  --  6.4  HGB 8.6* 9.2*  HCT 23.8* 27.1*  MCV 103.0* 108.8*  PLT 73* 106*   Basic Metabolic Panel: Recent Labs  Lab 04/04/23 2236 04/09/23 2028  NA 133* 132*  K 4.5 3.9  CL 98 100  CO2 25 23  GLUCOSE 104* 94  BUN 15 13  CREATININE 1.16 0.88  CALCIUM 8.6* 8.3*  MG  --  2.0   GFR: Estimated Creatinine Clearance: 94.8 mL/min (by C-G formula based on SCr of 0.88 mg/dL). Liver Function Tests: Recent Labs  Lab 04/05/23 0023  04/09/23 2028  AST 65* 64*  ALT 27 24  ALKPHOS 242* 206*  BILITOT 6.9* 10.6*  PROT 6.8 6.9  ALBUMIN 2.3* 2.5*   Recent Labs  Lab 04/09/23 2028  LIPASE 67*   Recent Labs  Lab 04/05/23 0023 04/09/23 2028  AMMONIA 24 22   Coagulation Profile: Recent Labs  Lab 04/04/23 2236 04/09/23 2028  INR 1.9* 1.8*   Cardiac Enzymes: No results for input(s): "CKTOTAL", "CKMB", "CKMBINDEX", "  TROPONINI" in the last 168 hours. BNP (last 3 results) No results for input(s): "PROBNP" in the last 8760 hours. HbA1C: No results for input(s): "HGBA1C" in the last 72 hours. CBG: No results for input(s): "GLUCAP" in the last 168 hours. Lipid Profile: No results for input(s): "CHOL", "HDL", "LDLCALC", "TRIG", "CHOLHDL", "LDLDIRECT" in the last 72 hours. Thyroid Function Tests: No results for input(s): "TSH", "T4TOTAL", "FREET4", "T3FREE", "THYROIDAB" in the last 72 hours. Anemia Panel: No results for input(s): "VITAMINB12", "FOLATE", "FERRITIN", "TIBC", "IRON", "RETICCTPCT" in the last 72 hours. Urine analysis:    Component Value Date/Time   COLORURINE AMBER (A) 01/22/2023 0052   APPEARANCEUR CLEAR 01/22/2023 0052   LABSPEC 1.044 (H) 01/22/2023 0052   PHURINE 5.0 01/22/2023 0052   GLUCOSEU NEGATIVE 01/22/2023 0052   HGBUR NEGATIVE 01/22/2023 0052   BILIRUBINUR NEGATIVE 01/22/2023 0052   KETONESUR NEGATIVE 01/22/2023 0052   PROTEINUR NEGATIVE 01/22/2023 0052   UROBILINOGEN 0.2 06/07/2013 1905   NITRITE NEGATIVE 01/22/2023 0052   LEUKOCYTESUR NEGATIVE 01/22/2023 0052    Radiological Exams on Admission: DG Femur Min 2 Views Left  Result Date: 04/09/2023 CLINICAL DATA:  Pain and swelling EXAM: LEFT FEMUR 2 VIEWS COMPARISON:  01/22/2023 FINDINGS: No recent fracture or dislocation is seen. There is linear smooth marginated calcification in quadriceps tendon close to patella suggesting calcific tendinosis with no interval change. There is edema in subcutaneous plane. IMPRESSION: No fracture or  dislocation is seen in left femur. Electronically Signed   By: Ernie Avena M.D.   On: 04/09/2023 21:26      Assessment/Plan    Principal Problem:   Syncope Active Problems:   Depression   Alcoholic cirrhosis (HCC)   Tobacco abuse   Acute lower GI bleeding   Traumatic ecchymosis of left thigh   Anemia of chronic disease    #) Syncope: 1 episode of syncope that occurred two days ago when the patient was moving from a seated to standing position and associated with prodrome, suggestive of potential orthostatic hypotension in the setting of potential relative intravascular depletion as a consequence of suspected acute lower gastrointestinal bleed, confounded by use of spironolactone and Lasix as component of management of his portal hypertension associate with alcoholic cirrhosis.  Will check orthostatic vital signs to further evaluate.  This is also within the context of a history of chronic borderline hypotension, for which the patient is on midodrine at home.   Not associated with any overt acute focal neurologic deficits. Clinically, acute ischemic stroke versus seizures appear less likely at this time. Presentation appears less consistent with ACS at this time, with presenting EKG showing no evidence of acute ischemic changes, and the absence of any associated CP.    In setting of associated prodrome, the possibility of ventricular arrhythmia appears to be less likely at this time, although will closely monitor on telemetry for any e/o potential contributory arrhythmia, while also assessing serum Mg level and optimizing serum potassium level as further detailed below.    Plan: I have placed a nursing communication order requesting that orthostatic vital signs x 1 set be checked and documented.  Given suspected contribution from intravascular depletion stemming from acute lower gastrointestinal bleed, will hold next doses of Lasix and spironolactone. Monitor on telemetry. Monitor  strict I's and O's.  Add-on serum Mg level. Check CMP, CBC, serum Mg level in the AM.  Potassium chloride 20 mill colons p.o. x 1 dose now.  Further evaluation management of suspected acute lower gastrointestinal bleed, as further  detailed below.  Chest x-ray.  Resume home midodrine following orthostatic vital sign assessment.              #) Acute lower gastrointestinal bleed: Suspicion for acute liver gastrointestinal bleed in the context of fecal occult blood positive finding patient presents with episode of syncope, suspected element of orthostatic involvement.  Otherwise, he appears hemodynamically stable at this time.  Additionally, hemoglobin appears to be within baseline range, although there may be an element of hemoconcentration given concomitant increase in hemoglobin level as well as platelet level over the course of the last week, potentially as a consequence of hemoconcentration in the setting of acute lower GI bleed.   BUN that is trending down, speaks against an upper gastrointestinal source.  Overall, presentation appears less suggestive of a acute variceal bleed.  Consequently, will refrain from initiation of octreotide.  However, as he is at increased risk for upper GI bleed, will start IV Protonix.  Additionally, in the context of his underlying liver disease, will also give continue Rocephin for SBP prophylaxis.  EDP contacted on-call [facility], Dr.**, Requesting formal consultation, as further detailed above.  Plan: [place] [facility] formally consulted, as above.  Protonix 40 mg IV twice daily.  Continue Rocephin for SBP prophylaxis.  Every 4 hour H&H's ordered through 9 AM on 04/10/2023.  CBC in the morning.  Clear liquids for now.  Add on iron studies.  Repeat INR in the morning.            #) Ecchymosis of the left thigh: Potentially as a consequence of fall 2 days ago as a result of previously described syncopal episode.  No associated  evidence of compartment syndrome, but this recent bleed into the left thigh is notable given concern for concomitant acute lower gastrointestinal bleed, potentially offering further implication of intravascular depletion as a consequence, with possible contributory role leading to his single syncopal episode.  Will continue to closely monitor his hemodynamic status along with close trending of serial H&H values, as further detailed below.  Of note, plain films of the left femur showed no evidence of acute fracture or dislocation, and the left lower extremity appears neurovascularly intact at this time.  Plan: Every 4 hours H&H's ordered through 9 AM on 04/10/2023.  Repeat CBC in the morning.  Repeat INR in the morning.  Iron studies, as above.             #) Alcoholic cirrhosis: Manage history of such, for which the patient reports that he has been abstinent from alcohol for over a year. Complicated by portal hypertension with recurrent ascites,  chronic thrombocytopenia and chronic anemia.  Follows with gastroenterology as outpatient GI. Notable outpatient medications: spironolactone, lasix.  Notable associated presenting labs include Na 132, T bili 10.6 relative to 6.9 on 03/28/2023, INR 1.8, down slightly from 1.9 on 04/04/2023. Presenting MELD score: 25, conferring an estimated 3 month mortality of 19.6%.     Plan: Monitor strict I's and O's and daily weights.  Repeat CMP, CBC, and INR in the AM. Check serum mag level.  Holding next dose of spironolactone and Lasix in the setting of concern regarding intravascular depletion due to acute lower gastrointestinal bleed, as above.  On-call [place] [facility] consulted, as above.              #) Depression: documented h/o such. On Paxil as outpatient.    Plan: Continue home Paxil.            #)  Chronic tobacco abuse: Patient conveys that they are a current smoker, having smoked 1 ppd for approximately 30 years.   Plan:  Counseled the patient for less than 2 minutes on the importance of complete smoking discontinuation.  Order placed for prn nicotine patch for use during this hospitalization.                #) Anemia of chronic liver disease: Documented history of such, a/w with baseline hgb range 8.5-10, with presenting hgb consistent with this range.  However, in the setting of concern for acute lower gastrointestinal bleed, will closely monitor ensuing hemoglobin trend and pursue further evaluation of potential acute lower GI bleed as outlined below.   Plan: Repeat CBC in the morning.  Do 4-hour H&H's through 9 AM on 04/10/2023.  On-call [facility] consulted, as above.  Repeat INR in the morning.  Refraining from pharmacologic DVT prophylaxis.  SCDs.       DVT prophylaxis: SCD's   Code Status: Full code Family Communication: none Disposition Plan: Per Rounding Team Consults called: EDP contacted on-call [place] [facility], Dr. Leone Payor, Requesting formal consultation, as further detailed above;  Admission status: Observation    I SPENT GREATER THAN 75  MINUTES IN CLINICAL CARE TIME/MEDICAL DECISION-MAKING IN COMPLETING THIS ADMISSION.     Chaney Born Rye Decoste DO [facility] From 7PM - 7AM   04/10/2023, 12:17 AM

## 2023-04-10 NOTE — Progress Notes (Signed)
[facility] PROGRESS NOTE    Progress Note  Nathaniel Warren  WGN:[id number] DOB: Mar 10, 1966 DOA: 04/09/2023 PCP: Ivonne Andrew, NP     Brief Narrative:   Nathaniel Warren is an 57 y.o. male past medical history significant for alcoholic cirrhosis on midodrine, anemia of chronic disease with a hemoglobin of 8-10 who had a syncopal episode about 2 days prior to admission he went from the sitting to standing position and develop lightheadedness and dizziness.  Assessment/Plan:   BPH: Resume Lasix and Aldactone his history is not compatible with orthostatic hypotension.  He relates he does not hit his head. Continue midodrine, check orthostatics. He describes very nicely the room spinning around him he gets this even when lying in bed that he turns his head to the side he gets dizzy that last about 15 seconds. Pt for vestibular rehab. Regular diet.  Anemia of chronic disease/positive FOBT: Lower GI was consulted by EDP started on IV PPI twice a day and IV Rocephin for SBP prophylaxis. There is mild drop in hemoglobin from 9.2->7.7. INR is 2,FOBT was positive, he has a large extensive left thigh hematoma with an INR of 2 which could explain his drop in his hemoglobin. Will give him vitamin K for 2 days check an INR. Check hemoglobin tomorrow morning.  Left thigh hematoma: Likely due to traumatic fall and elevated INR. This could explain his drop in hemoglobin. Given  vitamin K.  Alcoholic cirrhosis: He relates he has been abstinent for a year, he has had recurrent ascites, history of chronic thrombocytopenia and anemia. Holding Aldactone and Lasix as this could have been contributing to his orthostatic hypotension.  Depression Continue Paxil.  Tobacco abuse Counseling   Sacral decubitus ulcer stage II: RN Pressure Injury Documentation: Pressure Injury 02/09/23 Buttocks Left Stage 2 -  Partial thickness loss of dermis presenting as a shallow open injury with a red, pink  wound bed without slough. (Active)  02/09/23 1710  Location: Buttocks  Location Orientation: Left  Staging: Stage 2 -  Partial thickness loss of dermis presenting as a shallow open injury with a red, pink wound bed without slough.  Wound Description (Comments):   Present on Admission: No    DVT prophylaxis: scd Family Communication:none Status is: Observation The patient remains OBS appropriate and will d/c before 2 midnights.    Code Status:     Code Status Orders  (From admission, onward)           Start     Ordered   04/09/23 2246  Full code  Continuous       Question:  By:  Answer:  Consent: discussion documented in EHR   04/09/23 2246           Code Status History     Date Active Date Inactive Code Status Order ID Comments User Context   03/04/2023 0957 03/09/2023 2016 Full Code [id number]  Maryln Gottron, MD ED   01/22/2023 2311 03/02/2023 1820 Full Code [id number]  Therisa Doyne, MD ED   12/02/2022 0254 12/06/2022 1715 Full Code [id number]  Hillary Bow, DO ED   02/24/2022 1110 02/25/2022 2155 Full Code [id number]  Teddy Spike, DO ED   02/21/2022 1326 02/23/2022 1955 Full Code [id number]  Teddy Spike, DO Inpatient   10/27/2017 1408 11/07/2017 1754 Full Code [id number]  Delila Pereyra, NP Inpatient   10/20/2017 2158 10/27/2017 1359 Full Code [id number]  Eduard Clos, MD ED   09/18/2017 1716 09/19/2017  1448 Full Code [id number]  Iona Coach ED   04/22/2013 0440 04/22/2013 1327 Full Code [id number]  Loren Racer, MD ED   04/08/2013 1736 04/09/2013 1131 Full Code [id number]  Arnoldo Hooker PA-C ED   01/22/2013 1022 01/24/2013 1520 Full Code [id number]  Loren Racer, MD ED   01/08/2013 1834 01/09/2013 1438 Full Code [id number]  Pisciotta, Mardella Layman ED         IV Access:   Peripheral IV   Procedures and diagnostic studies:   DG Chest Port 1 View  Result Date: 04/10/2023 CLINICAL DATA:  106001 Syncope 106001 EXAM: PORTABLE CHEST 1  VIEW COMPARISON:  Chest x-ray 04/04/2023, CT angio chest 04/05/2023 FINDINGS: The heart and mediastinal contours are unchanged. No focal consolidation. No pulmonary edema. No pleural effusion. No pneumothorax. No acute osseous abnormality. IMPRESSION: No active disease. Electronically Signed   By: Tish Frederickson M.D.   On: 04/10/2023 00:31   DG Femur Min 2 Views Left  Result Date: 04/09/2023 CLINICAL DATA:  Pain and swelling EXAM: LEFT FEMUR 2 VIEWS COMPARISON:  01/22/2023 FINDINGS: No recent fracture or dislocation is seen. There is linear smooth marginated calcification in quadriceps tendon close to patella suggesting calcific tendinosis with no interval change. There is edema in subcutaneous plane. IMPRESSION: No fracture or dislocation is seen in left femur. Electronically Signed   By: Ernie Avena M.D.   On: 04/09/2023 21:26     Medical Consultants:   None.   Subjective:    Nathaniel Warren he relates the room spinning around him every time he is move his head from side-to-side otherwise he feels fine and is hungry would like to eat.  Objective:    Vitals:   04/10/23 0515 04/10/23 0545 04/10/23 0600 04/10/23 0612  BP: (!) 96/42 (!) 103/43 110/75   Pulse: 65 72 86   Resp: 18  18   Temp:    98.2 F (36.8 C)  TempSrc:    Oral  SpO2: 92% 93% 96%    SpO2: 96 %   Intake/Output Summary (Last 24 hours) at 04/10/2023 0704 Last data filed at 04/09/2023 2232 Gross per 24 hour  Intake 200 ml  Output --  Net 200 ml   There were no vitals filed for this visit.  Exam: General exam: In no acute distress. Respiratory system: Good air movement and clear to auscultation. Cardiovascular system: S1 & S2 heard, RRR. No JVD. Gastrointestinal system: Abdomen is nondistended, soft and nontender.  Extremities: No pedal edema. Skin: Extensive left thigh hematoma Psychiatry: Judgement and insight appear normal. Mood & affect appropriate.    Data Reviewed:    Labs: Basic Metabolic  Panel: Recent Labs  Lab 04/04/23 2236 04/09/23 2028  NA 133* 132*  K 4.5 3.9  CL 98 100  CO2 25 23  GLUCOSE 104* 94  BUN 15 13  CREATININE 1.16 0.88  CALCIUM 8.6* 8.3*  MG  --  2.0   GFR Estimated Creatinine Clearance: 94.8 mL/min (by C-G formula based on SCr of 0.88 mg/dL). Liver Function Tests: Recent Labs  Lab 04/05/23 0023 04/09/23 2028  AST 65* 64*  ALT 27 24  ALKPHOS 242* 206*  BILITOT 6.9* 10.6*  PROT 6.8 6.9  ALBUMIN 2.3* 2.5*   Recent Labs  Lab 04/09/23 2028  LIPASE 67*   Recent Labs  Lab 04/05/23 0023 04/09/23 2028  AMMONIA 24 22   Coagulation profile Recent Labs  Lab 04/04/23 2236 04/09/23 2028 04/10/23 0627  INR 1.9* 1.8* 2.1*  COVID-19 Labs  No results for input(s): "DDIMER", "FERRITIN", "LDH", "CRP" in the last 72 hours.  Lab Results  Component Value Date   SARSCOV2NAA NEGATIVE 12/04/2022    CBC: Recent Labs  Lab 04/04/23 2236 04/09/23 2028  WBC 15.7* 11.9*  NEUTROABS  --  6.4  HGB 8.6* 9.2*  HCT 23.8* 27.1*  MCV 103.0* 108.8*  PLT 73* 106*   Cardiac Enzymes: No results for input(s): "CKTOTAL", "CKMB", "CKMBINDEX", "TROPONINI" in the last 168 hours. BNP (last 3 results) No results for input(s): "PROBNP" in the last 8760 hours. CBG: No results for input(s): "GLUCAP" in the last 168 hours. D-Dimer: No results for input(s): "DDIMER" in the last 72 hours. Hgb A1c: No results for input(s): "HGBA1C" in the last 72 hours. Lipid Profile: No results for input(s): "CHOL", "HDL", "LDLCALC", "TRIG", "CHOLHDL", "LDLDIRECT" in the last 72 hours. Thyroid function studies: No results for input(s): "TSH", "T4TOTAL", "T3FREE", "THYROIDAB" in the last 72 hours.  Invalid input(s): "FREET3" Anemia work up: No results for input(s): "VITAMINB12", "FOLATE", "FERRITIN", "TIBC", "IRON", "RETICCTPCT" in the last 72 hours. Sepsis Labs: Recent Labs  Lab 04/04/23 2236 04/05/23 0305 04/05/23 0541 04/09/23 2028  WBC 15.7*  --   --  11.9*   LATICACIDVEN  --  1.3 1.1  --    Microbiology No results found for this or any previous visit (from the past 240 hour(s)).   Medications:    midodrine  5 mg Oral TID WC   pantoprazole (PROTONIX) IV  40 mg Intravenous Q12H   PARoxetine  20 mg Oral Daily   QUEtiapine  25 mg Oral QHS   Continuous Infusions:  cefTRIAXone (ROCEPHIN)  IV        LOS: 0 days   Marinda Elk  [facility]  04/10/2023, 7:04 AM

## 2023-04-10 NOTE — ED Notes (Signed)
Pt tolerating clear liquids.

## 2023-04-11 DIAGNOSIS — K7460 Unspecified cirrhosis of liver: Secondary | ICD-10-CM | POA: Diagnosis not present

## 2023-04-11 DIAGNOSIS — Z7901 Long term (current) use of anticoagulants: Secondary | ICD-10-CM | POA: Diagnosis not present

## 2023-04-11 DIAGNOSIS — F17210 Nicotine dependence, cigarettes, uncomplicated: Secondary | ICD-10-CM | POA: Diagnosis present

## 2023-04-11 DIAGNOSIS — I272 Pulmonary hypertension, unspecified: Secondary | ICD-10-CM | POA: Diagnosis present

## 2023-04-11 DIAGNOSIS — D500 Iron deficiency anemia secondary to blood loss (chronic): Secondary | ICD-10-CM | POA: Diagnosis present

## 2023-04-11 DIAGNOSIS — S7012XA Contusion of left thigh, initial encounter: Secondary | ICD-10-CM | POA: Diagnosis present

## 2023-04-11 DIAGNOSIS — Z716 Tobacco abuse counseling: Secondary | ICD-10-CM | POA: Diagnosis not present

## 2023-04-11 DIAGNOSIS — I9589 Other hypotension: Secondary | ICD-10-CM | POA: Diagnosis present

## 2023-04-11 DIAGNOSIS — L89152 Pressure ulcer of sacral region, stage 2: Secondary | ICD-10-CM | POA: Diagnosis present

## 2023-04-11 DIAGNOSIS — D696 Thrombocytopenia, unspecified: Secondary | ICD-10-CM | POA: Diagnosis present

## 2023-04-11 DIAGNOSIS — K766 Portal hypertension: Secondary | ICD-10-CM | POA: Diagnosis present

## 2023-04-11 DIAGNOSIS — H8110 Benign paroxysmal vertigo, unspecified ear: Secondary | ICD-10-CM | POA: Diagnosis present

## 2023-04-11 DIAGNOSIS — Z8673 Personal history of transient ischemic attack (TIA), and cerebral infarction without residual deficits: Secondary | ICD-10-CM | POA: Diagnosis not present

## 2023-04-11 DIAGNOSIS — N400 Enlarged prostate without lower urinary tract symptoms: Secondary | ICD-10-CM | POA: Diagnosis present

## 2023-04-11 DIAGNOSIS — R55 Syncope and collapse: Secondary | ICD-10-CM | POA: Diagnosis present

## 2023-04-11 DIAGNOSIS — W1830XA Fall on same level, unspecified, initial encounter: Secondary | ICD-10-CM | POA: Diagnosis present

## 2023-04-11 DIAGNOSIS — T148XXA Other injury of unspecified body region, initial encounter: Secondary | ICD-10-CM | POA: Diagnosis not present

## 2023-04-11 DIAGNOSIS — D638 Anemia in other chronic diseases classified elsewhere: Secondary | ICD-10-CM | POA: Diagnosis present

## 2023-04-11 DIAGNOSIS — Z825 Family history of asthma and other chronic lower respiratory diseases: Secondary | ICD-10-CM | POA: Diagnosis not present

## 2023-04-11 DIAGNOSIS — D72819 Decreased white blood cell count, unspecified: Secondary | ICD-10-CM | POA: Diagnosis present

## 2023-04-11 DIAGNOSIS — E7220 Disorder of urea cycle metabolism, unspecified: Secondary | ICD-10-CM | POA: Diagnosis present

## 2023-04-11 DIAGNOSIS — F32A Depression, unspecified: Secondary | ICD-10-CM | POA: Diagnosis present

## 2023-04-11 DIAGNOSIS — D689 Coagulation defect, unspecified: Secondary | ICD-10-CM | POA: Diagnosis present

## 2023-04-11 DIAGNOSIS — K7030 Alcoholic cirrhosis of liver without ascites: Secondary | ICD-10-CM | POA: Diagnosis present

## 2023-04-11 DIAGNOSIS — K922 Gastrointestinal hemorrhage, unspecified: Secondary | ICD-10-CM | POA: Diagnosis not present

## 2023-04-11 DIAGNOSIS — Z833 Family history of diabetes mellitus: Secondary | ICD-10-CM | POA: Diagnosis not present

## 2023-04-11 DIAGNOSIS — Z8249 Family history of ischemic heart disease and other diseases of the circulatory system: Secondary | ICD-10-CM | POA: Diagnosis not present

## 2023-04-11 DIAGNOSIS — Z79899 Other long term (current) drug therapy: Secondary | ICD-10-CM | POA: Diagnosis not present

## 2023-04-11 LAB — BASIC METABOLIC PANEL
Anion gap: 6 (ref 5–15)
BUN: 11 mg/dL (ref 6–20)
CO2: 25 mmol/L (ref 22–32)
Calcium: 8 mg/dL — ABNORMAL LOW (ref 8.9–10.3)
Chloride: 102 mmol/L (ref 98–111)
Creatinine, Ser: 0.88 mg/dL (ref 0.61–1.24)
GFR, Estimated: >60 mL/min (ref >60)
Glucose, Bld: 94 mg/dL (ref 70–99)
Potassium: 3.9 mmol/L (ref 3.5–5.1)
Sodium: 133 mmol/L — ABNORMAL LOW (ref 135–145)

## 2023-04-11 LAB — PROTIME-INR
INR: 2 — ABNORMAL HIGH (ref 0.8–1.2)
Prothrombin Time: 22.7 seconds — ABNORMAL HIGH (ref 11.4–15.2)

## 2023-04-11 MED ORDER — CYCLOBENZAPRINE HCL 5 MG PO TABS: 7.5000 mg | ORAL_TABLET | Freq: Once | ORAL | Status: DC

## 2023-04-11 MED ORDER — PHYTONADIONE 5 MG PO TABS
5.0000 mg | ORAL_TABLET | Freq: Once | ORAL | Status: AC
  Administered 2023-04-11: 5 mg via ORAL
  Filled 2023-04-11: qty 1

## 2023-04-11 MED ORDER — VITAMIN K1 10 MG/ML IJ SOLN: 1.0000 mg | Freq: Every day | INTRAVENOUS | Status: DC

## 2023-04-11 MED ORDER — CYCLOBENZAPRINE HCL 5 MG PO TABS
5.0000 mg | ORAL_TABLET | Freq: Once | ORAL | Status: AC
  Administered 2023-04-11: 5 mg via ORAL
  Filled 2023-04-11: qty 1

## 2023-04-11 NOTE — Progress Notes (Signed)
Physical Therapy Treatment Patient Details Name: Nathaniel Warren MRN: [id number] DOB: October 08, 1966 Today's Date: 04/11/2023   History of Present Illness Pt is 57 yo male admitted 04/09/23 with syncopal episode.  Of note, pt with hypotension baseline and on midodrine at home.  Pt with hx of alcoholic cirrhosis, depression, anemia from chronic liver disease.    PT Comments    Pt agreeable to positional testing.  Performed Gilberto Better to right and left.  Pt with very mild nystagmus on Left Gilberto Better and reported spinning symptoms which lasted <10 seconds.  Pt then taken into Epley maneuver.  Pt declined mobility today due to right LE muscle spasms (RN notified).  Pt encouraged to limit quick movements and head turns (typically sleeps on his sides and reports tossing and turning last night with increase in his symptoms).  Pt reports he had vertigo symptoms last night and then worse this morning however improved this afternoon.    Recommendations for follow up therapy are one component of a multi-disciplinary discharge planning process, led by the attending physician.  Recommendations may be updated based on patient status, additional functional criteria and insurance authorization.  Follow Up Recommendations       Assistance Recommended at Discharge Intermittent Supervision/Assistance  Patient can return home with the following A little help with walking and/or transfers;A little help with bathing/dressing/bathroom;Assistance with cooking/housework   Equipment Recommendations  None recommended by PT    Recommendations for Other Services       Precautions / Restrictions Precautions Precautions: Fall Precaution Comments: orthostatic     Mobility  Bed Mobility Overal bed mobility: Modified Independent             General bed mobility comments: cues for slow movement (due to BPPV)    Transfers                        Ambulation/Gait                   Stairs              Wheelchair Mobility    Modified Rankin (Stroke Patients Only)       Balance                                            Cognition Arousal/Alertness: Awake/alert Behavior During Therapy: WFL for tasks assessed/performed Overall Cognitive Status: Within Functional Limits for tasks assessed                                          Exercises      General Comments        Pertinent Vitals/Pain Pain Assessment Pain Assessment: 0-10 Pain Score: 7  Pain Location: R LE Pain Descriptors / Indicators: Cramping Pain Intervention(s): Repositioned, Monitored during session (already received pain meds per RN, notified RN of more cramping nature)    Home Living                          Prior Function            PT Goals (current goals can now be found in the care plan section) Progress towards PT goals: Progressing toward goals  Frequency    Min 1X/week      PT Plan Current plan remains appropriate    Co-evaluation              AM-PAC PT "6 Clicks" Mobility   Outcome Measure  Help needed turning from your back to your side while in a flat bed without using bedrails?: None Help needed moving from lying on your back to sitting on the side of a flat bed without using bedrails?: None Help needed moving to and from a bed to a chair (including a wheelchair)?: A Little Help needed standing up from a chair using your arms (e.g., wheelchair or bedside chair)?: A Little Help needed to walk in hospital room?: A Little Help needed climbing 3-5 steps with a railing? : A Little 6 Click Score: 20    End of Session   Activity Tolerance: Patient tolerated treatment well Patient left: in bed;with call bell/phone within reach;with bed alarm set Nurse Communication: Mobility status PT Visit Diagnosis: Other abnormalities of gait and mobility (R26.89);BPPV BPPV - Right/Left : Left     Time: 4034-7425 PT Time  Calculation (min) (ACUTE ONLY): 19 min  Charges:  $Canalith Rep Proc: 8-22 mins                     Paulino Door, DPT Physical Therapist Acute Rehabilitation Services Office: [phone]    Janan Halter [place] 04/11/2023, 4:31 PM

## 2023-04-11 NOTE — Progress Notes (Signed)
[facility] PROGRESS NOTE    Progress Note  Nathaniel Warren  RUE:[id number] DOB: 17-Jan-1966 DOA: 04/09/2023 PCP: Ivonne Andrew, NP     Brief Narrative:   Nathaniel Warren is an 57 y.o. male past medical history significant for alcoholic cirrhosis on midodrine, anemia of chronic disease with a hemoglobin of 8-10 who had a syncopal episode about 2 days prior to admission he went from the sitting to standing position and develop lightheadedness and dizziness.  Assessment/Plan:   BPPV: Continue Lasix and Aldactone at current dose. Continue midodrine. Physical therapy is working with the patient on inner ear maneuvers. Pt for vestibular rehab. Regular diet. Paracentesis not needed at this point in time.  Anemia of chronic disease/positive FOBT: Lower GI was consulted by EDP continue Protonix. Discontinue Rocephin.   There is mild drop in hemoglobin from 9.2->7.7. Hemoglobin has stabilized at 7.8, was given vitamin K 3 doses. INR is pending this morning. Hemoglobin is pending this morning.  Left thigh hematoma: Likely due to traumatic fall and elevated INR. Hematoma could explain his drop in hemoglobin was given vitamin K.  Alcoholic cirrhosis: He relates he has been abstinent for a year, he has had recurrent ascites, history of chronic thrombocytopenia and anemia. Resume Aldactone and Lasix.  Depression Continue Paxil.  Tobacco abuse Counseling   Sacral decubitus ulcer stage II: RN Pressure Injury Documentation: Pressure Injury 02/09/23 Buttocks Left Stage 2 -  Partial thickness loss of dermis presenting as a shallow open injury with a red, pink wound bed without slough. (Active)  02/09/23 1710  Location: Buttocks  Location Orientation: Left  Staging: Stage 2 -  Partial thickness loss of dermis presenting as a shallow open injury with a red, pink wound bed without slough.  Wound Description (Comments):   Present on Admission: No    DVT prophylaxis:  scd Family Communication:none Status is: Observation The patient remains OBS appropriate and will d/c before 2 midnights.    Code Status:     Code Status Orders  (From admission, onward)           Start     Ordered   04/09/23 2246  Full code  Continuous       Question:  By:  Answer:  Consent: discussion documented in EHR   04/09/23 2246           Code Status History     Date Active Date Inactive Code Status Order ID Comments User Context   03/04/2023 0957 03/09/2023 2016 Full Code [id number]  Maryln Gottron, MD ED   01/22/2023 2311 03/02/2023 1820 Full Code [id number]  Therisa Doyne, MD ED   12/02/2022 0254 12/06/2022 1715 Full Code [id number]  Hillary Bow, DO ED   02/24/2022 1110 02/25/2022 2155 Full Code [id number]  Margie Ege A, DO ED   02/21/2022 1326 02/23/2022 1955 Full Code [id number]  Teddy Spike, DO Inpatient   10/27/2017 1408 11/07/2017 1754 Full Code [id number]  Delila Pereyra, NP Inpatient   10/20/2017 2158 10/27/2017 1359 Full Code [id number]  Eduard Clos, MD ED   09/18/2017 1716 09/19/2017 1448 Full Code [id number]  Jaynie Crumble, PA-C ED   04/22/2013 0440 04/22/2013 1327 Full Code [id number]  Loren Racer, MD ED   04/08/2013 1736 04/09/2013 1131 Full Code [id number]  Arnoldo Hooker, PA-C ED   01/22/2013 1022 01/24/2013 1520 Full Code [id number]  Loren Racer, MD ED   01/08/2013 1834 01/09/2013 1438 Full Code [id number]  Pisciotta, Mardella Layman ED  IV Access:   Peripheral IV   Procedures and diagnostic studies:   DG Chest Port 1 View  Result Date: 04/10/2023 CLINICAL DATA:  106001 Syncope 106001 EXAM: PORTABLE CHEST 1 VIEW COMPARISON:  Chest x-ray 04/04/2023, CT angio chest 04/05/2023 FINDINGS: The heart and mediastinal contours are unchanged. No focal consolidation. No pulmonary edema. No pleural effusion. No pneumothorax. No acute osseous abnormality. IMPRESSION: No active disease. Electronically Signed   By: Tish Frederickson  M.D.   On: 04/10/2023 00:31   DG Femur Min 2 Views Left  Result Date: 04/09/2023 CLINICAL DATA:  Pain and swelling EXAM: LEFT FEMUR 2 VIEWS COMPARISON:  01/22/2023 FINDINGS: No recent fracture or dislocation is seen. There is linear smooth marginated calcification in quadriceps tendon close to patella suggesting calcific tendinosis with no interval change. There is edema in subcutaneous plane. IMPRESSION: No fracture or dislocation is seen in left femur. Electronically Signed   By: Ernie Avena M.D.   On: 04/09/2023 21:26     Medical Consultants:   None.   Subjective:    Nathaniel Warren no new complaints.  Objective:    Vitals:   04/10/23 1222 04/10/23 2026 04/11/23 0044 04/11/23 0541  BP: (!) 98/53 (!) 95/56 (!) 95/56 (!) 92/56  Pulse: 62 69 69 61  Resp: 18 14 14 14   Temp: 98.4 F (36.9 C) 99.4 F (37.4 C) 98.5 F (36.9 C) 99 F (37.2 C)  TempSrc: Oral Oral Oral Oral  SpO2: 99% 93% 95% 97%  Weight:    80.9 kg   SpO2: 97 %   Intake/Output Summary (Last 24 hours) at 04/11/2023 1019 Last data filed at 04/11/2023 0730 Gross per 24 hour  Intake 240 ml  Output 3500 ml  Net -3260 ml    Filed Weights   04/11/23 0541  Weight: 80.9 kg    Exam: General exam: In no acute distress. Respiratory system: Good air movement and clear to auscultation. Cardiovascular system: S1 & S2 heard, RRR. No JVD. Gastrointestinal system: Abdomen is nondistended, soft and nontender.  Extremities: No pedal edema. Skin: Extensive left thigh hematoma Psychiatry: Judgement and insight appear normal. Mood & affect appropriate.    Data Reviewed:    Labs: Basic Metabolic Panel: Recent Labs  Lab 04/04/23 2236 04/09/23 2028 04/10/23 0627 04/11/23 0809  NA 133* 132* 134* 133*  K 4.5 3.9 3.9 3.9  CL 98 100 103 102  CO2 25 23 24 25   GLUCOSE 104* 94 90 94  BUN 15 13 12 11   CREATININE 1.16 0.88 0.75 0.88  CALCIUM 8.6* 8.3* 8.2* 8.0*  MG  --  2.0 2.0  --     GFR Estimated  Creatinine Clearance: 94.8 mL/min (by C-G formula based on SCr of 0.88 mg/dL). Liver Function Tests: Recent Labs  Lab 04/05/23 0023 04/09/23 2028 04/10/23 0627  AST 65* 64* 57*  ALT 27 24 22   ALKPHOS 242* 206* 150*  BILITOT 6.9* 10.6* 8.9*  PROT 6.8 6.9 5.9*  ALBUMIN 2.3* 2.5* 2.0*    Recent Labs  Lab 04/09/23 2028  LIPASE 67*    Recent Labs  Lab 04/05/23 0023 04/09/23 2028  AMMONIA 24 22    Coagulation profile Recent Labs  Lab 04/04/23 2236 04/09/23 2028 04/10/23 0627  INR 1.9* 1.8* 2.1*    COVID-19 Labs  Recent Labs    04/10/23 0819  FERRITIN 555*    Lab Results  Component Value Date   SARSCOV2NAA NEGATIVE 12/04/2022    CBC: Recent Labs  Lab 04/04/23  2236 04/09/23 2028 04/10/23 0627 04/10/23 0819  WBC 15.7* 11.9* 10.3  --   NEUTROABS  --  6.4 7.5  --   HGB 8.6* 9.2* 7.7* 7.8*  HCT 23.8* 27.1* 22.5* 22.3*  MCV 103.0* 108.8* 108.2*  --   PLT 73* 106* 100*  --     Cardiac Enzymes: No results for input(s): "CKTOTAL", "CKMB", "CKMBINDEX", "TROPONINI" in the last 168 hours. BNP (last 3 results) No results for input(s): "PROBNP" in the last 8760 hours. CBG: No results for input(s): "GLUCAP" in the last 168 hours. D-Dimer: No results for input(s): "DDIMER" in the last 72 hours. Hgb A1c: No results for input(s): "HGBA1C" in the last 72 hours. Lipid Profile: No results for input(s): "CHOL", "HDL", "LDLCALC", "TRIG", "CHOLHDL", "LDLDIRECT" in the last 72 hours. Thyroid function studies: No results for input(s): "TSH", "T4TOTAL", "T3FREE", "THYROIDAB" in the last 72 hours.  Invalid input(s): "FREET3" Anemia work up: Recent Labs    04/10/23 0819  FERRITIN 555*  TIBC 146*  IRON 102   Sepsis Labs: Recent Labs  Lab 04/04/23 2236 04/05/23 0305 04/05/23 0541 04/09/23 2028 04/10/23 0627  WBC 15.7*  --   --  11.9* 10.3  LATICACIDVEN  --  1.3 1.1  --   --     Microbiology No results found for this or any previous visit (from the past  240 hour(s)).   Medications:    furosemide  40 mg Oral Daily   midodrine  5 mg Oral TID WC   pantoprazole  40 mg Oral BID   PARoxetine  20 mg Oral Daily   phytonadione  5 mg Oral Daily   QUEtiapine  25 mg Oral QHS   spironolactone  50 mg Oral Daily   Continuous Infusions:  cefTRIAXone (ROCEPHIN)  IV 1 g (04/10/23 1559)      LOS: 0 days   Marinda Elk  [facility]  04/11/2023, 10:19 AM

## 2023-04-12 DIAGNOSIS — H8110 Benign paroxysmal vertigo, unspecified ear: Secondary | ICD-10-CM | POA: Diagnosis not present

## 2023-04-12 DIAGNOSIS — K7460 Unspecified cirrhosis of liver: Secondary | ICD-10-CM | POA: Diagnosis not present

## 2023-04-12 DIAGNOSIS — K922 Gastrointestinal hemorrhage, unspecified: Secondary | ICD-10-CM | POA: Diagnosis not present

## 2023-04-12 DIAGNOSIS — T148XXA Other injury of unspecified body region, initial encounter: Secondary | ICD-10-CM | POA: Diagnosis not present

## 2023-04-12 LAB — BASIC METABOLIC PANEL
Anion gap: 6 (ref 5–15)
BUN: 13 mg/dL (ref 6–20)
CO2: 26 mmol/L (ref 22–32)
Calcium: 8.1 mg/dL — ABNORMAL LOW (ref 8.9–10.3)
Chloride: 103 mmol/L (ref 98–111)
Creatinine, Ser: 0.88 mg/dL (ref 0.61–1.24)
GFR, Estimated: >60 mL/min (ref >60)
Glucose, Bld: 100 mg/dL — ABNORMAL HIGH (ref 70–99)
Potassium: 3.7 mmol/L (ref 3.5–5.1)
Sodium: 135 mmol/L (ref 135–145)

## 2023-04-12 MED ORDER — VITAMIN K1 10 MG/ML IJ SOLN
1.0000 mg | Freq: Once | INTRAVENOUS | Status: AC
  Administered 2023-04-12: 1 mg via INTRAVENOUS
  Filled 2023-04-12: qty 0.1

## 2023-04-12 MED ORDER — TRAMADOL HCL 50 MG PO TABS: 50.0000 mg | ORAL_TABLET | Freq: Two times a day (BID) | ORAL | 0 refills | Status: DC | PRN

## 2023-04-12 MED ORDER — CYCLOBENZAPRINE HCL 5 MG PO TABS: 5.0000 mg | ORAL_TABLET | Freq: Three times a day (TID) | ORAL | 0 refills | Status: DC | PRN

## 2023-04-12 NOTE — Progress Notes (Signed)
Patient discharged to home with family, discharge instructions reviewed with patient who verbalized understanding. 

## 2023-04-12 NOTE — Discharge Summary (Addendum)
Physician Discharge Summary  Nathaniel Warren ZOX:[id number] DOB: 1966-11-16 DOA: 04/09/2023  PCP: Ivonne Andrew, NP  Admit date: 04/09/2023 Discharge date: 04/12/2023  Admitted From: Home Disposition:  Home  Recommendations for Outpatient Follow-up:  Follow up with PCP in 1-2 weeks Please obtain BMP/CBC in one week   Home Health:No Equipment/Devices:None  Discharge Condition:Stable CODE STATUS:Full Diet recommendation: Heart Healthy  Brief/Interim Summary: 57 y.o. male past medical history significant for alcoholic cirrhosis on midodrine, anemia of chronic disease with a hemoglobin of 8-10 who had a syncopal episode about 2 days prior to admission he went from the sitting to standing position and develop lightheadedness and dizziness.   Discharge Diagnoses:  Principal Problem:   Syncope Active Problems:   Depression   Alcoholic cirrhosis (HCC)   Tobacco abuse   Acute lower GI bleeding   Traumatic ecchymosis of left thigh   Anemia of chronic disease   Blood loss anemia  BPPV: Physical therapy was consulted he will need to follow-up with outpatient rehab for vestibular rehab. Initially his Lasix and Aldactone were held, now they have been resumed he relates his symptoms are improved.  Anemia of chronic disease/positive FOBT: GI bowel was consulted they recommended to continue Protonix, he received 1 dose of Rocephin on admission. There is a mild drop in hemoglobin see below for further details. His INR was elevated he was given vitamin K and his INR slowly improved, it is likely due to his alcoholic cirrhosis.  Large left thigh hematoma: The supratherapeutic INR, his hemoglobin was monitored he was given vitamin K his hemoglobin stabilized.  Alcoholic cirrhosis: With a MELD score 3.0 26, he has been abstinent for a year he has had no recurrent ascites. He has a history of thrombocytopenia and leukopenia. His Aldactone and Lasix will be continued as an outpatient has a  follow-up GI as an outpatient.  He has been referred to transplant clinic as an outpatient. We were able to discontinue his narcotics as he relates his tramadol was working well. He will go home on tramadol temporarily.  Depression: Continue Paxil.  Tobacco abuse: Counseling.   Discharge Instructions  Discharge Instructions     Diet - low sodium heart healthy   Complete by: As directed    Increase activity slowly   Complete by: As directed       Allergies as of 04/12/2023   No Known Allergies      Medication List     STOP taking these medications    Minerin Creme Crea   oxyCODONE-acetaminophen 5-325 MG tablet Commonly known as: PERCOCET/ROXICET       TAKE these medications    cyclobenzaprine 5 MG tablet Commonly known as: FLEXERIL Take 1 tablet (5 mg total) by mouth 3 (three) times daily as needed for muscle spasms.   furosemide 40 MG tablet Commonly known as: LASIX Take 1 tablet (40 mg total) by mouth daily.   midodrine 5 MG tablet Commonly known as: PROAMATINE Take 1 tablet (5 mg total) by mouth 3 (three) times daily with meals.   ondansetron 4 MG disintegrating tablet Commonly known as: ZOFRAN-ODT Dissolve 1 tablet (4 mg total) by mouth every 8 (eight) hours as needed for nausea or vomiting.   ondansetron 4 MG tablet Commonly known as: ZOFRAN Take 1 tablet (4 mg total) by mouth every 8 (eight) hours as needed for nausea or vomiting.   PARoxetine 20 MG tablet Commonly known as: PAXIL Take 1 tablet (20 mg total) by mouth daily.  polyethylene glycol powder 17 GM/SCOOP powder Commonly known as: GLYCOLAX/MIRALAX Measure 17 grams of powder (see fill line of cap) & mix in 4 oz of liquid/juice & drink by mouth daily as needed for mild or moderate constipation as directed.   QUEtiapine 25 MG tablet Commonly known as: SEROQUEL Take 1 tablet (25 mg total) by mouth at bedtime.   spironolactone 50 MG tablet Commonly known as: Aldactone Take 1 tablet  (50 mg total) by mouth in the morning.        No Known Allergies  Consultations: GI   Procedures/Studies: DG Chest Port 1 View  Result Date: 04/10/2023 CLINICAL DATA:  106001 Syncope 106001 EXAM: PORTABLE CHEST 1 VIEW COMPARISON:  Chest x-ray 04/04/2023, CT angio chest 04/05/2023 FINDINGS: The heart and mediastinal contours are unchanged. No focal consolidation. No pulmonary edema. No pleural effusion. No pneumothorax. No acute osseous abnormality. IMPRESSION: No active disease. Electronically Signed   By: Tish Frederickson M.D.   On: 04/10/2023 00:31   DG Femur Min 2 Views Left  Result Date: 04/09/2023 CLINICAL DATA:  Pain and swelling EXAM: LEFT FEMUR 2 VIEWS COMPARISON:  01/22/2023 FINDINGS: No recent fracture or dislocation is seen. There is linear smooth marginated calcification in quadriceps tendon close to patella suggesting calcific tendinosis with no interval change. There is edema in subcutaneous plane. IMPRESSION: No fracture or dislocation is seen in left femur. Electronically Signed   By: Ernie Avena M.D.   On: 04/09/2023 21:26   VAS [place] LOWER EXTREMITY VENOUS (DVT) (ONLY [facility] & [facility])  Result Date: 04/05/2023  Lower Venous DVT Study Patient Name:  Nathaniel Warren  Date of Exam:   04/05/2023 Medical Rec #: [id number]        Accession #:    [id number] Date of Birth: [birth year]         Patient Gender: M Patient Age:   53 years Exam Location:  [facility] Procedure:      VAS [place] LOWER EXTREMITY VENOUS (DVT) Referring Phys: Berle Mull --------------------------------------------------------------------------------  Indications: Swelling.  Risk Factors: ETOH abuse, cirrhosis of liver with ascites. Anasarca. Comparison Study: No prior left LEV on file Performing Technologist: Sherren Kerns RVS  Examination Guidelines: A complete evaluation includes B-mode imaging, spectral Doppler, color Doppler, and power Doppler as needed of all accessible portions of each vessel. Bilateral  testing is considered an integral part of a complete examination. Limited examinations for reoccurring indications may be performed as noted. The reflux portion of the exam is performed with the patient in reverse Trendelenburg.  +-----+---------------+---------+-----------+----------+--------------+ RIGHTCompressibilityPhasicitySpontaneityPropertiesThrombus Aging +-----+---------------+---------+-----------+----------+--------------+ CFV  Full           Yes      Yes                                 +-----+---------------+---------+-----------+----------+--------------+   +----------+---------------+---------+-----------+----------+--------------+ LEFT      CompressibilityPhasicitySpontaneityPropertiesThrombus Aging +----------+---------------+---------+-----------+----------+--------------+ CFV       Full           Yes      Yes                                 +----------+---------------+---------+-----------+----------+--------------+ SFJ       Full                                                        +----------+---------------+---------+-----------+----------+--------------+  FV Prox   Full                                                        +----------+---------------+---------+-----------+----------+--------------+ FV Mid    Full           Yes      Yes                                 +----------+---------------+---------+-----------+----------+--------------+ FV Distal Full           Yes      Yes                                 +----------+---------------+---------+-----------+----------+--------------+ PFV       Full                                                        +----------+---------------+---------+-----------+----------+--------------+ POP       Full           Yes      Yes                                 +----------+---------------+---------+-----------+----------+--------------+ PTV       Full                                                         +----------+---------------+---------+-----------+----------+--------------+ PERO      Full                                                        +----------+---------------+---------+-----------+----------+--------------+ EIV                      Yes      Yes                  patent         +----------+---------------+---------+-----------+----------+--------------+ CIV distal               Yes      Yes                  patent         +----------+---------------+---------+-----------+----------+--------------+     Summary: RIGHT: - No evidence of common femoral vein obstruction.  LEFT: - There is no evidence of deep vein thrombosis in the lower extremity.  - No cystic structure found in the popliteal fossa. Interstitial edema noted throughout.  *See table(s) above for measurements and observations. Electronically signed by Heath Lark on 04/05/2023 at 10:54:44 AM.    Final    MR ABDOMEN MRCP W WO CONTAST  Result Date: 04/05/2023 CLINICAL DATA:  Jaundice. EXAM: MRI ABDOMEN WITHOUT AND WITH CONTRAST (INCLUDING MRCP) TECHNIQUE: Multiplanar multisequence MR imaging of the abdomen was performed both before and after the administration of intravenous contrast. Heavily T2-weighted images of the biliary and pancreatic ducts were obtained, and three-dimensional MRCP images were rendered by post processing. CONTRAST:  10mL GADAVIST GADOBUTROL 1 MMOL/ML IV SOLN COMPARISON:  CT scan earlier same day. Ultrasound exam earlier same day FINDINGS: Markedly motion degraded study. Lower chest: Tiny bilateral effusions. Hepatobiliary: Evaluation of the liver is markedly motion degraded. Within this limitation, contour appears nodular. No focal or discrete restricted diffusion within the liver parenchyma. Postcontrast imaging shows no focal arterial phase hyperenhancement. No suspicious focal abnormality within the liver. Gallbladder is markedly distended. No gallbladder wall  thickening or mural edema evident by MRI. No discernible gallstones. Evaluation of bile ducts is markedly motion degraded. Common duct in the porta hepatis measures on the order of 10 mm diameter. There is gradual tapering of the common bile duct measuring 6-7 mm diameter just proximal to the ampulla. No definite choledocholithiasis. Pancreas: Motion degraded. No gross pancreatic lesion evident. No main duct dilatation. Spleen:  No focal mass lesion evident. Adrenals/Urinary Tract: No adrenal mass evident. No hydronephrosis. Postcontrast imaging of the kidneys is motion degraded but no appreciable enhancing mass lesion evident. Stomach/Bowel: Stomach is nondistended. Wall thickening noted in the gastric antrum on CT earlier today is less prominent by MRI. Duodenum is normally positioned as is the ligament of Treitz. No small bowel or colonic dilatation in the abdomen. Vascular/Lymphatic: No abdominal aortic aneurysm. Portal vein, superior mesenteric vein, and splenic vein appear patent but assessment is markedly degraded by motion artifact. No abdominal lymphadenopathy Other: Moderate volume ascites with diffuse mesenteric and body wall edema. Supraumbilical ventral hernia contains fat and fluid. Musculoskeletal: No focal suspicious marrow enhancement within the visualized bony anatomy. IMPRESSION: 1. Markedly motion degraded study. Small or subtle lesions in the solid abdominal viscera could be obscured. 2. Distended gallbladder. Calcified gallstone(s) seen on CT earlier today are not discernible by MRI, likely obscured by motion artifact. No gallbladder wall thickening or mural edema evident by MRI. 3. Nodular hepatic contour suggests cirrhosis. 4. Mild dilatation of the common bile duct without definite choledocholithiasis. Common bile duct tapers gradually into the ampulla. 5. Moderate volume ascites with diffuse mesenteric and body wall edema. Tiny bilateral pleural effusions. 6. Supraumbilical ventral hernia  contains fat and fluid. Electronically Signed   By: Kennith Center M.D.   On: 04/05/2023 07:17   MR 3D Recon At Scanner  Result Date: 04/05/2023 CLINICAL DATA:  Jaundice. EXAM: MRI ABDOMEN WITHOUT AND WITH CONTRAST (INCLUDING MRCP) TECHNIQUE: Multiplanar multisequence MR imaging of the abdomen was performed both before and after the administration of intravenous contrast. Heavily T2-weighted images of the biliary and pancreatic ducts were obtained, and three-dimensional MRCP images were rendered by post processing. CONTRAST:  10mL GADAVIST GADOBUTROL 1 MMOL/ML IV SOLN COMPARISON:  CT scan earlier same day. Ultrasound exam earlier same day FINDINGS: Markedly motion degraded study. Lower chest: Tiny bilateral effusions. Hepatobiliary: Evaluation of the liver is markedly motion degraded. Within this limitation, contour appears nodular. No focal or discrete restricted diffusion within the liver parenchyma. Postcontrast imaging shows no focal arterial phase hyperenhancement. No suspicious focal abnormality within the liver. Gallbladder is markedly distended. No gallbladder wall thickening or mural edema evident by MRI. No discernible gallstones. Evaluation of bile ducts is markedly motion degraded. Common duct in the porta hepatis measures  on the order of 10 mm diameter. There is gradual tapering of the common bile duct measuring 6-7 mm diameter just proximal to the ampulla. No definite choledocholithiasis. Pancreas: Motion degraded. No gross pancreatic lesion evident. No main duct dilatation. Spleen:  No focal mass lesion evident. Adrenals/Urinary Tract: No adrenal mass evident. No hydronephrosis. Postcontrast imaging of the kidneys is motion degraded but no appreciable enhancing mass lesion evident. Stomach/Bowel: Stomach is nondistended. Wall thickening noted in the gastric antrum on CT earlier today is less prominent by MRI. Duodenum is normally positioned as is the ligament of Treitz. No small bowel or colonic  dilatation in the abdomen. Vascular/Lymphatic: No abdominal aortic aneurysm. Portal vein, superior mesenteric vein, and splenic vein appear patent but assessment is markedly degraded by motion artifact. No abdominal lymphadenopathy Other: Moderate volume ascites with diffuse mesenteric and body wall edema. Supraumbilical ventral hernia contains fat and fluid. Musculoskeletal: No focal suspicious marrow enhancement within the visualized bony anatomy. IMPRESSION: 1. Markedly motion degraded study. Small or subtle lesions in the solid abdominal viscera could be obscured. 2. Distended gallbladder. Calcified gallstone(s) seen on CT earlier today are not discernible by MRI, likely obscured by motion artifact. No gallbladder wall thickening or mural edema evident by MRI. 3. Nodular hepatic contour suggests cirrhosis. 4. Mild dilatation of the common bile duct without definite choledocholithiasis. Common bile duct tapers gradually into the ampulla. 5. Moderate volume ascites with diffuse mesenteric and body wall edema. Tiny bilateral pleural effusions. 6. Supraumbilical ventral hernia contains fat and fluid. Electronically Signed   By: Kennith Center M.D.   On: 04/05/2023 07:17   US Abdomen Limited  Result Date: 04/05/2023 CLINICAL DATA:  Right upper quadrant pain.  History of cirrhosis. EXAM: ULTRASOUND ABDOMEN LIMITED RIGHT UPPER QUADRANT COMPARISON:  03/28/2023. FINDINGS: Gallbladder: The gallbladder is distended and a small amount of sludge is noted. No stones or wall thickening is seen. No sonographic Murphy sign noted by sonographer. Common bile duct: Diameter: 1.3 cm. Liver: No focal lesion identified. Increased parenchymal echogenicity. The liver has a nodular contour, compatible with known cirrhosis. Portal vein is patent on color Doppler imaging with normal direction of blood flow towards the liver. Other: Moderate ascites. IMPRESSION: 1. Distended gallbladder with sludge, suggesting hydrops. No evidence of  acute cholecystitis. 2. Distended common bile duct measuring 13 mm. MRCP is suggested for further evaluation. 3. Morphologic changes of cirrhosis. 4. Moderate ascites. Electronically Signed   By: Thornell Sartorius M.D.   On: 04/05/2023 02:23   CT Angio Chest PE W and/or Wo Contrast  Result Date: 04/05/2023 CLINICAL DATA:  Pulmonary embolism suspected, high probability. Abdominal pain, acute, nonlocalized. EXAM: CT ANGIOGRAPHY CHEST CT ABDOMEN AND PELVIS WITH CONTRAST TECHNIQUE: Multidetector CT imaging of the chest was performed using the standard protocol during bolus administration of intravenous contrast. Multiplanar CT image reconstructions and MIPs were obtained to evaluate the vascular anatomy. Multidetector CT imaging of the abdomen and pelvis was performed using the standard protocol during bolus administration of intravenous contrast. RADIATION DOSE REDUCTION: This exam was performed according to the departmental dose-optimization program which includes automated exposure control, adjustment of the mA and/or kV according to patient size and/or use of iterative reconstruction technique. CONTRAST:  75mL OMNIPAQUE IOHEXOL 350 MG/ML SOLN COMPARISON:  03/04/2023. FINDINGS: CTA CHEST FINDINGS Cardiovascular: The heart is mildly enlarged and there is no pericardial effusion. Three-vessel coronary artery calcifications are noted. There is atherosclerotic calcification of the aorta without evidence of aneurysm. Pulmonary trunk is mildly distended, suggesting underlying  pulmonary artery hypertension. No definite evidence of pulmonary embolism. Mediastinum/Nodes: No enlarged mediastinal, hilar, or axillary lymph nodes. Thyroid gland, trachea, and esophagus demonstrate no significant findings. Lungs/Pleura: There are trace bilateral pleural effusions with atelectasis. No pneumothorax. Musculoskeletal: Gynecomastia is present bilaterally. Degenerative changes are present in the thoracic spine. No acute osseous  abnormality. Review of the MIP images confirms the above findings. CT ABDOMEN and PELVIS FINDINGS Hepatobiliary: No focal abnormality in the liver. The liver has a nodular contour, suggesting underlying cirrhosis. A stone is noted in the gallbladder. No biliary ductal dilatation Pancreas: Unremarkable. No pancreatic ductal dilatation or surrounding inflammatory changes. Spleen: Normal in size without focal abnormality. Adrenals/Urinary Tract: Adrenal glands are unremarkable. Kidneys are normal, without renal calculi, focal lesion, or hydronephrosis. Bladder is unremarkable. Stomach/Bowel: There is thickening of the walls of the gastric antrum. Appendix appears normal. No evidence of bowel wall thickening, distention, or inflammatory changes. No free air or pneumatosis. Scattered diverticula are present along the colon without evidence of diverticulitis. Vascular/Lymphatic: Aortic atherosclerosis. 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 Varices are noted in the anterior abdomen. Prominent lymph nodes are noted in the gastrohepatic ligament and porta hepatis. Reproductive: Prostate gland is normal in size. Other: Moderate ascites is present. There is a midline ventral abdominal wall hernia containing fat and ascites. A left inguinal hernia is noted containing ascites. Musculoskeletal: Degenerative changes are present in the lumbar spine. No acute osseous abnormality. Review of the MIP images confirms the above findings. IMPRESSION: 1. No evidence pulmonary embolism. 2. Trace bilateral pleural effusions with atelectasis. 3. Thickening of the walls of the gastric antrum. Endoscopy is suggested for further evaluation on follow-up. 4. Morphologic changes of cirrhosis and portal hypertension. 5. Moderate ascites. 6. Cholelithiasis. 7. Aortic atherosclerosis and coronary artery calcifications. Electronically Signed   By: Thornell Sartorius M.D.   On: 04/05/2023 01:45   CT ABDOMEN PELVIS W CONTRAST  Result Date:  04/05/2023 CLINICAL DATA:  Pulmonary embolism suspected, high probability. Abdominal pain, acute, nonlocalized. EXAM: CT ANGIOGRAPHY CHEST CT ABDOMEN AND PELVIS WITH CONTRAST TECHNIQUE: Multidetector CT imaging of the chest was performed using the standard protocol during bolus administration of intravenous contrast. Multiplanar CT image reconstructions and MIPs were obtained to evaluate the vascular anatomy. Multidetector CT imaging of the abdomen and pelvis was performed using the standard protocol during bolus administration of intravenous contrast. RADIATION DOSE REDUCTION: This exam was performed according to the departmental dose-optimization program which includes automated exposure control, adjustment of the mA and/or kV according to patient size and/or use of iterative reconstruction technique. CONTRAST:  75mL OMNIPAQUE IOHEXOL 350 MG/ML SOLN COMPARISON:  03/04/2023. FINDINGS: CTA CHEST FINDINGS Cardiovascular: The heart is mildly enlarged and there is no pericardial effusion. Three-vessel coronary artery calcifications are noted. There is atherosclerotic calcification of the aorta without evidence of aneurysm. Pulmonary trunk is mildly distended, suggesting underlying pulmonary artery hypertension. No definite evidence of pulmonary embolism. Mediastinum/Nodes: No enlarged mediastinal, hilar, or axillary lymph nodes. Thyroid gland, trachea, and esophagus demonstrate no significant findings. Lungs/Pleura: There are trace bilateral pleural effusions with atelectasis. No pneumothorax. Musculoskeletal: Gynecomastia is present bilaterally. Degenerative changes are present in the thoracic spine. No acute osseous abnormality. Review of the MIP images confirms the above findings. CT ABDOMEN and PELVIS FINDINGS Hepatobiliary: No focal abnormality in the liver. The liver has a nodular contour, suggesting underlying cirrhosis. A stone is noted in the gallbladder. No biliary ductal dilatation Pancreas: Unremarkable. No  pancreatic ductal dilatation or surrounding inflammatory changes. Spleen: Normal in  size without focal abnormality. Adrenals/Urinary Tract: Adrenal glands are unremarkable. Kidneys are normal, without renal calculi, focal lesion, or hydronephrosis. Bladder is unremarkable. Stomach/Bowel: There is thickening of the walls of the gastric antrum. Appendix appears normal. No evidence of bowel wall thickening, distention, or inflammatory changes. No free air or pneumatosis. Scattered diverticula are present along the colon without evidence of diverticulitis. Vascular/Lymphatic: Aortic atherosclerosis. 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 Varices are noted in the anterior abdomen. Prominent lymph nodes are noted in the gastrohepatic ligament and porta hepatis. Reproductive: Prostate gland is normal in size. Other: Moderate ascites is present. There is a midline ventral abdominal wall hernia containing fat and ascites. A left inguinal hernia is noted containing ascites. Musculoskeletal: Degenerative changes are present in the lumbar spine. No acute osseous abnormality. Review of the MIP images confirms the above findings. IMPRESSION: 1. No evidence pulmonary embolism. 2. Trace bilateral pleural effusions with atelectasis. 3. Thickening of the walls of the gastric antrum. Endoscopy is suggested for further evaluation on follow-up. 4. Morphologic changes of cirrhosis and portal hypertension. 5. Moderate ascites. 6. Cholelithiasis. 7. Aortic atherosclerosis and coronary artery calcifications. Electronically Signed   By: Thornell Sartorius M.D.   On: 04/05/2023 01:45   DG Chest 2 View  Result Date: 04/04/2023 CLINICAL DATA:  Chest pain EXAM: CHEST - 2 VIEW COMPARISON:  12/04/2022 FINDINGS: Mild cardiomegaly. No acute airspace disease or effusion. No pneumothorax IMPRESSION: No active cardiopulmonary disease. Mild cardiomegaly. Electronically Signed   By: Jasmine Pang M.D.   On: 04/04/2023 23:24   (Echo, Carotid,  EGD, Colonoscopy, ERCP)    Subjective: No new complaints.  Discharge Exam: Vitals:   04/11/23 2131 04/12/23 0414  BP: 97/68 (!) 91/56  Pulse: 66 (!) 58  Resp: 14 14  Temp: 98.9 F (37.2 C) 98 F (36.7 C)  SpO2: 98% 96%   Vitals:   04/11/23 0541 04/11/23 1348 04/11/23 2131 04/12/23 0414  BP: (!) 92/56 (!) 102/57 97/68 (!) 91/56  Pulse: 61 65 66 (!) 58  Resp: 14 18 14 14   Temp: 99 F (37.2 C) 98.7 F (37.1 C) 98.9 F (37.2 C) 98 F (36.7 C)  TempSrc: Oral Oral Oral Oral  SpO2: 97% 97% 98% 96%  Weight: 80.9 kg   79.8 kg    General: Pt is alert, awake, not in acute distress Cardiovascular: RRR, S1/S2 +, no rubs, no gallops Respiratory: CTA bilaterally, no wheezing, no rhonchi Abdominal: Soft, NT, ND, bowel sounds + Extremities: no edema, no cyanosis    The results of significant diagnostics from this hospitalization (including imaging, microbiology, ancillary and laboratory) are listed below for reference.     Microbiology: No results found for this or any previous visit (from the past 240 hour(s)).   Labs: BNP (last 3 results) Recent Labs    01/22/23 1928  BNP 161.2*   Basic Metabolic Panel: Recent Labs  Lab 04/09/23 2028 04/10/23 0627 04/11/23 0809 04/12/23 0749  NA 132* 134* 133* 135  K 3.9 3.9 3.9 3.7  CL 100 103 102 103  CO2 23 24 25 26   GLUCOSE 94 90 94 100*  BUN 13 12 11 13   CREATININE 0.88 0.75 0.88 0.88  CALCIUM 8.3* 8.2* 8.0* 8.1*  MG 2.0 2.0  --   --    Liver Function Tests: Recent Labs  Lab 04/09/23 2028 04/10/23 0627  AST 64* 57*  ALT 24 22  ALKPHOS 206* 150*  BILITOT 10.6* 8.9*  PROT 6.9 5.9*  ALBUMIN 2.5* 2.0*  Recent Labs  Lab 04/09/23 2028  LIPASE 67*   Recent Labs  Lab 04/09/23 2028  AMMONIA 22   CBC: Recent Labs  Lab 04/09/23 2028 04/10/23 0627 04/10/23 0819  WBC 11.9* 10.3  --   NEUTROABS 6.4 7.5  --   HGB 9.2* 7.7* 7.8*  HCT 27.1* 22.5* 22.3*  MCV 108.8* 108.2*  --   PLT 106* 100*  --    Cardiac  Enzymes: No results for input(s): "CKTOTAL", "CKMB", "CKMBINDEX", "TROPONINI" in the last 168 hours. BNP: Invalid input(s): "POCBNP" CBG: No results for input(s): "GLUCAP" in the last 168 hours. D-Dimer No results for input(s): "DDIMER" in the last 72 hours. Hgb A1c No results for input(s): "HGBA1C" in the last 72 hours. Lipid Profile No results for input(s): "CHOL", "HDL", "LDLCALC", "TRIG", "CHOLHDL", "LDLDIRECT" in the last 72 hours. Thyroid function studies No results for input(s): "TSH", "T4TOTAL", "T3FREE", "THYROIDAB" in the last 72 hours.  Invalid input(s): "FREET3" Anemia work up Recent Labs    04/10/23 0819  FERRITIN 555*  TIBC 146*  IRON 102   Urinalysis    Component Value Date/Time   COLORURINE AMBER (A) 01/22/2023 0052   APPEARANCEUR CLEAR 01/22/2023 0052   LABSPEC 1.044 (H) 01/22/2023 0052   PHURINE 5.0 01/22/2023 0052   GLUCOSEU NEGATIVE 01/22/2023 0052   HGBUR NEGATIVE 01/22/2023 0052   BILIRUBINUR NEGATIVE 01/22/2023 0052   KETONESUR NEGATIVE 01/22/2023 0052   PROTEINUR NEGATIVE 01/22/2023 0052   UROBILINOGEN 0.2 06/07/2013 1905   NITRITE NEGATIVE 01/22/2023 0052   LEUKOCYTESUR NEGATIVE 01/22/2023 0052   Sepsis Labs Recent Labs  Lab 04/09/23 2028 04/10/23 0627  WBC 11.9* 10.3   Microbiology No results found for this or any previous visit (from the past 240 hour(s)).   SIGNED:   Marinda Elk, MD  [facility] 04/12/2023, 12:00 PM Pager   If 7PM-7AM, please contact night-coverage [email or web address] Password [email or web address]

## 2023-04-13 ENCOUNTER — Encounter: Payer: Self-pay | Admitting: Neurology

## 2023-04-13 ENCOUNTER — Inpatient Hospital Stay: Payer: Medicaid Other | Admitting: Nurse Practitioner

## 2023-04-13 ENCOUNTER — Telehealth: Payer: Self-pay

## 2023-04-13 ENCOUNTER — Ambulatory Visit (INDEPENDENT_AMBULATORY_CARE_PROVIDER_SITE_OTHER): Payer: Medicaid Other | Admitting: Neurology

## 2023-04-13 VITALS — BP 108/61 | HR 70 | Ht 69.0 in | Wt 182.0 lb

## 2023-04-13 DIAGNOSIS — M21371 Foot drop, right foot: Secondary | ICD-10-CM

## 2023-04-13 NOTE — Patient Instructions (Signed)
Nerve testing of the right > left leg  Start physical therapy  Referral provided for ankle brace  ELECTROMYOGRAM AND NERVE CONDUCTION STUDIES (EMG/NCS) INSTRUCTIONS  How to Prepare The neurologist conducting the EMG will need to know if you have certain medical conditions. Tell the neurologist and other EMG lab personnel if you: Have a pacemaker or any other electrical medical device Take blood-thinning medications Have hemophilia, a blood-clotting disorder that causes prolonged bleeding Bathing Take a shower or bath shortly before your exam in order to remove oils from your skin. Don't apply lotions or creams before the exam.  What to Expect You'll likely be asked to change into a hospital gown for the procedure and lie down on an examination table. The following explanations can help you understand what will happen during the exam.  Electrodes. The neurologist or a technician places surface electrodes at various locations on your skin depending on where you're experiencing symptoms. Or the neurologist may insert needle electrodes at different sites depending on your symptoms.  Sensations. The electrodes will at times transmit a tiny electrical current that you may feel as a twinge or spasm. The needle electrode may cause discomfort or pain that usually ends shortly after the needle is removed. If you are concerned about discomfort or pain, you may want to talk to the neurologist about taking a short break during the exam.  Instructions. During the needle EMG, the neurologist will assess whether there is any spontaneous electrical activity when the muscle is at rest - activity that isn't present in healthy muscle tissue - and the degree of activity when you slightly contract the muscle.  He or she will give you instructions on resting and contracting a muscle at appropriate times. Depending on what muscles and nerves the neurologist is examining, he or she may ask you to change positions during  the exam.  After your EMG You may experience some temporary, minor bruising where the needle electrode was inserted into your muscle. This bruising should fade within several days. If it persists, contact your primary care doctor.

## 2023-04-13 NOTE — Transitions of Care (Post Inpatient/ED Visit) (Signed)
   04/13/2023  Name: Nathaniel Warren MRN: [id number] DOB: [birth year]  Today's TOC FU Call Status: Today's TOC FU Call Status:: Successful TOC FU Call Competed TOC FU Call Complete Date: 04/13/23  Transition Care Management Follow-up Telephone Call Date of Discharge: 04/12/23 Discharge Facility: [facility]) Type of Discharge: Inpatient Admission Primary Inpatient Discharge Diagnosis:: Syncope Reason for ED Visit: Other: How have you been since you were released from the hospital?: Better Any questions or concerns?: No  Items Reviewed: Did you receive and understand the discharge instructions provided?: Yes Medications obtained,verified, and reconciled?: Yes (Medications Reviewed) Any new allergies since your discharge?: No Dietary orders reviewed?: NA Do you have support at home?: Yes  Medications Reviewed Today: Medications Reviewed Today     Reviewed by Odis Hollingshead, CMA (Certified Medical Assistant) on 04/13/23 at 1513  Med List Status: <None>   Medication Order Taking? Sig Documenting Provider Last Dose Status Informant  cyclobenzaprine (FLEXERIL) 5 MG tablet [id number] Yes Take 1 tablet (5 mg total) by mouth 3 (three) times daily as needed for muscle spasms. Marinda Elk, MD Taking Active   furosemide (LASIX) 40 MG tablet [id number] Yes Take 1 tablet (40 mg total) by mouth daily. Cathren Harsh, MD Taking Active Self, Pharmacy Records  midodrine (PROAMATINE) 5 MG tablet [id number] Yes Take 1 tablet (5 mg total) by mouth 3 (three) times daily with meals. Rhetta Mura, MD Taking Active Self, Pharmacy Records  ondansetron [facility]) 4 MG tablet [id number] Yes Take 1 tablet (4 mg total) by mouth every 8 (eight) hours as needed for nausea or vomiting. Fayrene Helper, PA-C Taking Active Self, Pharmacy Records  Patient not taking:  Discontinued 04/13/23 1513 (Patient Preference) PARoxetine (PAXIL) 20 MG tablet [id number] Yes Take 1 tablet (20 mg total) by mouth daily. Cathren Harsh, MD Taking Active Self, Pharmacy Records  Patient not taking:  Discontinued 04/13/23 1513 (Patient Preference)          Med Note [facility], TONIA S   Sat Apr 04, 2023 11:07 PM)    QUEtiapine (SEROQUEL) 25 MG tablet [id number] Yes Take 1 tablet (25 mg total) by mouth at bedtime. Cathren Harsh, MD Taking Active Self, Pharmacy Records  spironolactone (ALDACTONE) 50 MG tablet [id number] Yes Take 1 tablet (50 mg total) by mouth in the morning. Cathren Harsh, MD Taking Active Self, Pharmacy Records            Home Care and Equipment/Supplies: Were Home Health Services Ordered?: NA Any new equipment or medical supplies ordered?: NA  Functional Questionnaire: Do you need assistance with bathing/showering or dressing?: No Do you need assistance with meal preparation?: No Do you need assistance with eating?: No Do you have difficulty maintaining continence: No Do you need assistance with getting out of bed/getting out of a chair/moving?: No Do you have difficulty managing or taking your medications?: No  Follow up appointments reviewed: PCP Follow-up appointment confirmed?: No (pt wants to be worked in before first available appt 04/23/22) MD Provider Line Number:[phone] Given: Yes Specialist Hospital Follow-up appointment confirmed?: NA Do you need transportation to your follow-up appointment?: No Do you understand care options if your condition(s) worsen?: Yes-patient verbalized understanding    SIGNATURE Agnes Lawrence, CMA (AAMA)  [facility] [phone]

## 2023-04-13 NOTE — Progress Notes (Signed)
Follow-up Visit   Date: 04/13/2023    Nathaniel Warren MRN: [id number] DOB: [birth year]    Nathaniel Warren is a 57 y.o. right-handed male with alcoholic cirrhosis and depression returning to the clinic for follow-up of right foot drop.  The patient was accompanied to the clinic by self.  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  He may also have overlapping L5-S1 radiculopathy on the right, but I suspect this is contributing to a lesser degree. MRI lumbar spine shows right S1 nerve compression and foraminal stenosis which could affect L5 nerve also.  He also has stenosis affecting the L3-4 levels which can cause proximal leg weakness.  He was evaluated by Dr. Lovell Sheehan and deemed not a surgical candidate.  He was initially seen in January and tested was ordered but he had several hospitalizations causing him not to schedule testing.   - NCS/EMG of the right > left leg  - Start PT for right foot drop  - Referral for right AFO  Return to clinic in 4 months  --------------------------------------------- History of present illness: In September 2023, he had a fall and landed inside the bathtub on the right side of the leg and hip.  Since this time, he has numbness over the lateral side of the lower leg and foot.  He also has foot drop and unable to move the toes.  He denies numbness/tingling or weakness in the left foot.  He was using crutches to walk initially and now a rigid walker.  He does not have a brace for the foot and tends to drag the foot    He lives at home with cousin and roommate in a one-level home.  He quit drinking alcohol in October of 2023 when he was diagnosed with cirrhosis.  He has history of alcohol abuse consume up to 1.5 pint - fifth of alcohol every 2 days x 20 years and reduced to a several drinks nightly for the past year.   UPDATE 04/13/2023:  He is here for follow-up visit. At his last visit, NCS/EMG was ordered  however he did not have it performed because he has had several hospitalizations over the past few months related to cirrhosis.  He continues to have right foot drop and feels that he has mild return of motion in the ankle. He did not have insurance so did not complete PT or get AFO.    He walks with a rollator and able to walker better when wearing shoes on the right foot. He has noticed that his foot tends to want to roll inwards.   Medications:  Current Outpatient Medications on File Prior to Visit  Medication Sig Dispense Refill   cyclobenzaprine (FLEXERIL) 5 MG tablet Take 1 tablet (5 mg total) by mouth 3 (three) times daily as needed for muscle spasms. 15 tablet 0   furosemide (LASIX) 40 MG tablet Take 1 tablet (40 mg total) by mouth daily. 30 tablet 3   midodrine (PROAMATINE) 5 MG tablet Take 1 tablet (5 mg total) by mouth 3 (three) times daily with meals. 90 tablet 0   ondansetron (ZOFRAN) 4 MG tablet Take 1 tablet (4 mg total) by mouth every 8 (eight) hours as needed for nausea or vomiting. 12 tablet 0   PARoxetine (PAXIL) 20 MG tablet Take 1 tablet (20 mg total) by mouth daily. 30 tablet 1   QUEtiapine (SEROQUEL) 25 MG tablet Take 1 tablet (25 mg total) by  mouth at bedtime. 30 tablet 1   spironolactone (ALDACTONE) 50 MG tablet Take 1 tablet (50 mg total) by mouth in the morning. 30 tablet 3   No current facility-administered medications on file prior to visit.    Allergies: No Known Allergies  Vital Signs:  BP 108/61   Pulse 70   Ht 5\' 9"  (1.753 m)   Wt 182 lb (82.6 kg)   SpO2 97%   BMI 26.88 kg/m   Neurological Exam: MENTAL STATUS including orientation to time, place, person, recent and remote memory, attention span and concentration, language, and fund of knowledge is normal.  Speech is not dysarthric.  CRANIAL NERVES:  No visual field defects.  Pupils equal round and reactive to light.  Normal conjugate, extra-ocular eye movements in all directions of gaze.  No ptosis.   Face is symmetric. Palate elevates symmetrically.  Tongue is midline.  MOTOR: Mild right TA atrophy. No pronator drift.  Tone is normal.    Upper Extremity:  Right  Left  Deltoid  5/5   5/5   Biceps  5/5   5/5   Triceps  5/5   5/5   Wrist extensors  5/5   5/5   Wrist flexors  5/5   5/5   Finger extensors  5/5   5/5   Finger flexors  5/5   5/5   Dorsal interossei  5/5   5/5   Tone (Ashworth scale)  0  0   Lower Extremity:  Right  Left  Hip flexors  5/5   5/5   Knee flexors  5/5   5/5   Knee extensors  5/5   5/5   Dorsiflexors  2/5   5/5   Plantarflexors  3/5   5/5   Toe extensors  0/5   5/5   Toe flexors  0/5   5/5   Tone (Ashworth scale)  0  0   MSRs:                                           Right        Left brachioradialis 2+  2+  biceps 2+  2+  triceps 2+  2+  patellar 3+  3+  ankle jerk 0  2+  Hoffman no  no  plantar response down  down    SENSORY:  Vibration absent at the right ankle and great toe.  Pin prick and temperature reduced over the right lateral leg and foot.  Sensation intact above the right knee and at the left foot.    COORDINATION/GAIT: Normal finger-to- nose-finger.  Finger tapping intact.  Gait testing shows dragging of the right foot, mild steppage, assisted with walker   Data: MRI lumbar spine wo contrast 12/02/2022: 1. L5-S1: Right-sided predominant disc herniation with caudal down turning. Stenosis of the subarticular lateral recess on the right with apparent compression/displacement of the right S1 nerve. Mild bilateral foraminal narrowing that could possibly affect either L5 nerve. 2. L4-5: Bilateral lateral recess stenosis that could affect either L5 nerve. Left foraminal encroachment by osteophyte and disc material could affect the left L4 nerve. 3. L2-3: Bilateral lateral recess stenosis that could affect either L3 nerve. 4. L3-4: Mild bilateral lateral recess stenosis but without likely neural compression.    Thank you for allowing me  to participate in patient's care.  If I can answer any additional  questions, I would be pleased to do so.    Sincerely,    Retina Bernardy K. Posey Pronto, DO

## 2023-04-14 ENCOUNTER — Other Ambulatory Visit (HOSPITAL_COMMUNITY): Payer: Self-pay

## 2023-04-14 LAB — ACID FAST CULTURE WITH REFLEXED SENSITIVITIES (MYCOBACTERIA)
Acid Fast Culture: NEGATIVE
Acid Fast Culture: NEGATIVE

## 2023-04-16 NOTE — Telephone Encounter (Signed)
Pt was scheduled for follow up. Kh

## 2023-04-17 ENCOUNTER — Other Ambulatory Visit (HOSPITAL_COMMUNITY): Payer: Self-pay

## 2023-04-23 ENCOUNTER — Ambulatory Visit: Payer: Medicaid Other | Attending: Neurology | Admitting: Physical Therapy

## 2023-04-23 ENCOUNTER — Ambulatory Visit: Payer: Medicaid Other | Admitting: Nurse Practitioner

## 2023-04-24 ENCOUNTER — Encounter: Payer: Self-pay | Admitting: Neurology

## 2023-04-24 ENCOUNTER — Encounter: Payer: Medicaid Other | Admitting: Neurology

## 2023-05-14 ENCOUNTER — Encounter: Payer: Medicaid Other | Admitting: Neurology

## 2023-05-20 ENCOUNTER — Ambulatory Visit: Payer: Self-pay | Admitting: Nurse Practitioner

## 2023-05-27 ENCOUNTER — Other Ambulatory Visit: Payer: Self-pay | Admitting: *Deleted

## 2023-05-27 ENCOUNTER — Encounter: Payer: Self-pay | Admitting: Gastroenterology

## 2023-05-27 ENCOUNTER — Other Ambulatory Visit (HOSPITAL_COMMUNITY): Payer: Self-pay

## 2023-05-27 ENCOUNTER — Other Ambulatory Visit (INDEPENDENT_AMBULATORY_CARE_PROVIDER_SITE_OTHER): Payer: Medicaid Other

## 2023-05-27 ENCOUNTER — Ambulatory Visit (INDEPENDENT_AMBULATORY_CARE_PROVIDER_SITE_OTHER): Payer: Medicaid Other | Admitting: Gastroenterology

## 2023-05-27 VITALS — BP 110/64 | HR 72 | Ht 69.0 in | Wt 186.0 lb

## 2023-05-27 DIAGNOSIS — K766 Portal hypertension: Secondary | ICD-10-CM | POA: Diagnosis not present

## 2023-05-27 DIAGNOSIS — D649 Anemia, unspecified: Secondary | ICD-10-CM | POA: Diagnosis not present

## 2023-05-27 DIAGNOSIS — R188 Other ascites: Secondary | ICD-10-CM | POA: Diagnosis not present

## 2023-05-27 DIAGNOSIS — K7030 Alcoholic cirrhosis of liver without ascites: Secondary | ICD-10-CM | POA: Diagnosis not present

## 2023-05-27 DIAGNOSIS — F4310 Post-traumatic stress disorder, unspecified: Secondary | ICD-10-CM

## 2023-05-27 DIAGNOSIS — K7460 Unspecified cirrhosis of liver: Secondary | ICD-10-CM

## 2023-05-27 DIAGNOSIS — F332 Major depressive disorder, recurrent severe without psychotic features: Secondary | ICD-10-CM

## 2023-05-27 DIAGNOSIS — K7290 Hepatic failure, unspecified without coma: Secondary | ICD-10-CM

## 2023-05-27 DIAGNOSIS — Z1211 Encounter for screening for malignant neoplasm of colon: Secondary | ICD-10-CM

## 2023-05-27 DIAGNOSIS — K7031 Alcoholic cirrhosis of liver with ascites: Secondary | ICD-10-CM | POA: Diagnosis not present

## 2023-05-27 DIAGNOSIS — Z23 Encounter for immunization: Secondary | ICD-10-CM

## 2023-05-27 DIAGNOSIS — F411 Generalized anxiety disorder: Secondary | ICD-10-CM

## 2023-05-27 LAB — COMPREHENSIVE METABOLIC PANEL
ALT: 23 U/L (ref 0–53)
AST: 65 U/L — ABNORMAL HIGH (ref 0–37)
Albumin: 2.8 g/dL — ABNORMAL LOW (ref 3.5–5.2)
Alkaline Phosphatase: 315 U/L — ABNORMAL HIGH (ref 39–117)
BUN: 11 mg/dL (ref 6–23)
CO2: 28 mEq/L (ref 19–32)
Calcium: 8.5 mg/dL (ref 8.4–10.5)
Chloride: 100 mEq/L (ref 96–112)
Creatinine, Ser: 0.97 mg/dL (ref 0.40–1.50)
GFR: 88.01 mL/min (ref >60.00)
Glucose, Bld: 101 mg/dL — ABNORMAL HIGH (ref 70–99)
Potassium: 3.7 mEq/L (ref 3.5–5.1)
Sodium: 134 mEq/L — ABNORMAL LOW (ref 135–145)
Total Bilirubin: 6.9 mg/dL — ABNORMAL HIGH (ref 0.2–1.2)
Total Protein: 7.5 g/dL (ref 6.0–8.3)

## 2023-05-27 LAB — CBC
HCT: 35.4 % — ABNORMAL LOW (ref 39.0–52.0)
Hemoglobin: 12.2 g/dL — ABNORMAL LOW (ref 13.0–17.0)
MCHC: 34.4 g/dL (ref 30.0–36.0)
MCV: 107.7 fl — ABNORMAL HIGH (ref 78.0–100.0)
Platelets: 110 10*3/uL — ABNORMAL LOW (ref 150.0–400.0)
RBC: 3.28 Mil/uL — ABNORMAL LOW (ref 4.22–5.81)
RDW: 15.1 % (ref 11.5–15.5)
WBC: 9 10*3/uL (ref 4.0–10.5)

## 2023-05-27 LAB — PROTIME-INR
INR: 1.8 ratio — ABNORMAL HIGH (ref 0.8–1.0)
Prothrombin Time: 18.2 s — ABNORMAL HIGH (ref 9.6–13.1)

## 2023-05-27 MED ORDER — NA SULFATE-K SULFATE-MG SULF 17.5-3.13-1.6 GM/177ML PO SOLN
ORAL | 0 refills | Status: DC
  Filled 2023-05-27: qty 354, 2d supply, fill #0

## 2023-05-27 MED ORDER — SPIRONOLACTONE 50 MG PO TABS
50.0000 mg | ORAL_TABLET | Freq: Every morning | ORAL | 11 refills | Status: DC
  Filled 2023-05-27: qty 30, 30d supply, fill #0

## 2023-05-27 MED ORDER — PAROXETINE HCL 20 MG PO TABS
20.0000 mg | ORAL_TABLET | Freq: Every day | ORAL | 0 refills | Status: DC
Start: 2023-05-27 — End: 2024-03-24
  Filled 2023-05-27 – 2023-10-19 (×2): qty 30, 30d supply, fill #0

## 2023-05-27 MED ORDER — QUETIAPINE FUMARATE 25 MG PO TABS
25.0000 mg | ORAL_TABLET | Freq: Every day | ORAL | 0 refills | Status: DC
  Filled 2023-05-27: qty 30, 30d supply, fill #0

## 2023-05-27 MED ORDER — ONDANSETRON HCL 4 MG PO TABS
4.0000 mg | ORAL_TABLET | Freq: Three times a day (TID) | ORAL | 11 refills | Status: DC | PRN
  Filled 2023-05-27 – 2023-10-14 (×2): qty 12, 4d supply, fill #0

## 2023-05-27 MED ORDER — FUROSEMIDE 40 MG PO TABS
40.0000 mg | ORAL_TABLET | Freq: Every day | ORAL | 11 refills | Status: DC
  Filled 2023-05-27 – 2023-10-19 (×2): qty 30, 30d supply, fill #0
  Filled 2023-11-20: qty 30, 30d supply, fill #1

## 2023-05-27 NOTE — Progress Notes (Signed)
GASTROENTEROLOGY OUTPATIENT CLINIC VISIT   Primary Care Provider Ivonne Andrew, NP [address] [place] [address] [phone]   Patient Profile: Nathaniel Warren is a 57 y.o. male with a pmh significant for alcoholic cirrhosis (complicated by portal hypertension manifested as ascites), alcohol use disorder (in remission), MDD, bipolar, prior seizures, prior CVA.  The patient presents to the [facility] for an evaluation and management of problem(s) noted below:  Problem List 1. Alcoholic cirrhosis of liver with ascites (HCC)   2. Decompensated hepatic cirrhosis (HCC)   3. Portal hypertension (HCC)   4. Anemia, unspecified type   5. Colon cancer screening   6. Posttraumatic stress disorder   7. GAD (generalized anxiety disorder)   8. MDD (major depressive disorder), recurrent severe, without psychosis (HCC)      History of Present Illness Please see prior GI notes for full details of HPI.  Interval History The patient returns for follow-up.  The patient was recently admitted to the hospital in March for progressive jaundice and hepatic decompensation.  He was discharged and then ended up returning to the hospital in early May with heme positive anemia.  He was quickly discharged with plan for follow-up.  He has not noted any more blood in the stools.  Patient states that he is feeling well.  He is not needing any other paracenteses but does feel some fluid in his abdomen.  He is not having any abdominal pain.  His bowel movements are regular without any blood in the stools.  He feels that his mental state is doing well.  He continues to be fully abstain from any alcohol for now a year.  He is ready to move forward with procedures if they are felt to be necessary.  Today is his last day of all of his medications and he is hopeful that we can give some refills.  His urine still remains dark but there are days where we will be a little bit more clear.   The patient denies any issues with generalized pruritus, clay-colored stools, LE edema, hematemesis, coffee-ground emesis, confusion.   GI Review of Systems Positive as above Negative for pyrosis, dysphagia, nausea, vomiting, pain, change in bowel habits, melena, hematochezia  Review of Systems General: Denies fevers/chills/weight loss or gain unintentionally Cardiovascular: Denies chest pain Pulmonary: Denies shortness of breath Gastroenterological: See HPI Genitourinary: As per HPI Hematological: Denies easy bruising/bleeding Dermatological: He has noted that he has remained jaundice Psychological: Mood is stable   Medications Current Outpatient Medications  Medication Sig Dispense Refill   cyclobenzaprine (FLEXERIL) 5 MG tablet Take 1 tablet (5 mg total) by mouth 3 (three) times daily as needed for muscle spasms. 15 tablet 0   Na Sulfate-K Sulfate-Mg Sulf 17.5-3.13-1.6 GM/177ML SOLN Use as directed 354 mL 0   furosemide (LASIX) 40 MG tablet Take 1 tablet (40 mg total) by mouth daily. 30 tablet 11   ondansetron (ZOFRAN) 4 MG tablet Take 1 tablet (4 mg total) by mouth every 8 (eight) hours as needed for nausea or vomiting. 12 tablet 11   PARoxetine (PAXIL) 20 MG tablet Take 1 tablet (20 mg total) by mouth daily. FURTHER REFILLS TO COME FROM PCP 30 tablet 0   QUEtiapine (SEROQUEL) 25 MG tablet Take 1 tablet (25 mg total) by mouth at bedtime. FURTHER REFILLS TO COME FROM PCP 30 tablet 0   spironolactone (ALDACTONE) 50 MG tablet Take 1 tablet (50 mg total) by mouth in the morning.  30 tablet 11   No current facility-administered medications for this visit.    Allergies No Known Allergies   Histories Past Medical History:  Diagnosis Date   Cirrhosis (HCC)    Depression    Drug overdose 10/20/2017   Drug overdose, intentional self-harm, initial encounter (HCC) 10/20/2017   Drug overdose, intentional, initial encounter (HCC) 10/20/2017   Hernia, abdominal    Liver failure (HCC)     Seizures (HCC)    Stroke (HCC)    Tobacco abuse    Past Surgical History:  Procedure Laterality Date   ANKLE SURGERY     ANKLE SURGERY Right    HERNIA REPAIR     INCISION AND DRAINAGE OF WOUND Left 02/03/2023   Procedure: IRRIGATION AND DEBRIDEMENT WOUND LEFT RING FINGER POSSIBLE AMPUTATION;  Surgeon: Gomez Cleverly, MD;  Location: [facility];  Service: Orthopedics;  Laterality: Left;   SPLENECTOMY     Social History   Socioeconomic History   Marital status: Single    Spouse name: Not on file   Number of children: 2   Years of education: 14   Highest education level: Not on file  Occupational History   Occupation: Corporate investment banker  Tobacco Use   Smoking status: Every Day    Packs/day: 1.00    Years: 30.00    Additional pack years: 0.00    Total pack years: 30.00    Types: Cigarettes   Smokeless tobacco: Former  Building services engineer Use: Never used  Substance and Sexual Activity   Alcohol use: Not Currently   Drug use: Yes    Types: Marijuana   Sexual activity: Yes    Comment:  wife had hysterectomy  Other Topics Concern   Not on file  Social History Narrative   ** Merged History Encounter **    Left Handed. Can use right as well.    Unable to work   Lives with roommate and cousin   Drinks caffeine   One floor house   Social Determinants of Health   Financial Resource Strain: High Risk (07/15/2022)   Overall Financial Resource Strain (CARDIA)    Difficulty of Paying Living Expenses: Very hard  Food Insecurity: No Food Insecurity (03/04/2023)   Hunger Vital Sign    Worried About Running Out of Food in the Last Year: Never true    Ran Out of Food in the Last Year: Never true  Recent Concern: Food Insecurity - Food Insecurity Present (03/02/2023)   Hunger Vital Sign    Worried About Running Out of Food in the Last Year: Sometimes true    Ran Out of Food in the Last Year: Sometimes true  Transportation Needs: No Transportation Needs (03/04/2023)   PRAPARE -  Administrator, Civil Service (Medical): No    Lack of Transportation (Non-Medical): No  Recent Concern: Transportation Needs - Unmet Transportation Needs (01/23/2023)   PRAPARE - Transportation    Lack of Transportation (Medical): Yes    Lack of Transportation (Non-Medical): Yes  Physical Activity: Inactive (07/15/2022)   Exercise Vital Sign    Days of Exercise per Week: 0 days    Minutes of Exercise per Session: 0 min  Stress: Stress Concern Present (07/15/2022)   [employer] of Occupational Health - Occupational Stress Questionnaire    Feeling of Stress : Very much  Social Connections: Socially Isolated (07/15/2022)   Social Connection and Isolation Panel [NHANES]    Frequency of Communication with Friends and Family: More than three  times a week    Frequency of Social Gatherings with Friends and Family: Once a week    Attends Religious Services: Never    Database administrator or Organizations: No    Attends Banker Meetings: Never    Marital Status: Divorced  Catering manager Violence: Not At Risk (03/04/2023)   Humiliation, Afraid, Rape, and Kick questionnaire    Fear of Current or Ex-Partner: No    Emotionally Abused: No    Physically Abused: No    Sexually Abused: No   Family History  Problem Relation Age of Onset   COPD Mother    Diabetes Father    Stroke Maternal Grandmother    Alzheimer's disease Maternal Grandfather    Cancer Paternal Grandmother    Stomach cancer Paternal Grandfather    Hypertension Other    Colon cancer Neg Hx    Esophageal cancer Neg Hx    Colon polyps Neg Hx    Inflammatory bowel disease Neg Hx    Liver disease Neg Hx    Pancreatic cancer Neg Hx    Rectal cancer Neg Hx    I have reviewed his medical, social, and family history in detail and updated the electronic medical record as necessary.    PHYSICAL EXAMINATION  BP 110/64   Pulse 72   Ht 5\' 9"  (1.753 m)   Wt 186 lb (84.4 kg)   BMI 27.47 kg/m  Wt  Readings from Last 3 Encounters:  05/27/23 186 lb (84.4 kg)  04/13/23 182 lb (82.6 kg)  04/12/23 175 lb 14.8 oz (79.8 kg)  GEN: NAD, appears stated age, doesn't appear chronically ill PSYCH: Cooperative, without pressured speech EYE: Icteric sclerae ENT: MMM CV: Nontachycardic RESP: No audible wheezing GI: NABS, soft, protuberant abdomen, rounded, shifting dullness is present, NT, without rebound MSK/EXT: Trace bilateral pedal edema SKIN: Visibly jaundiced, spider angiomata on upper thorax noted NEURO:  Alert & Oriented x 3, no focal deficits, no evidence of asterixis   REVIEW OF DATA  I reviewed the following data at the time of this encounter:  GI Procedures and Studies  No new studies to review  Laboratory Studies  Reviewed those in epic from recent admission  Imaging Studies  4/24 MRI/MRCP IMPRESSION: 1. Markedly motion degraded study. Small or subtle lesions in the solid abdominal viscera could be obscured. 2. Distended gallbladder. Calcified gallstone(s) seen on CT earlier today are not discernible by MRI, likely obscured by motion artifact. No gallbladder wall thickening or mural edema evident by MRI. 3. Nodular hepatic contour suggests cirrhosis. 4. Mild dilatation of the common bile duct without definite choledocholithiasis. Common bile duct tapers gradually into the ampulla. 5. Moderate volume ascites with diffuse mesenteric and body wall edema. Tiny bilateral pleural effusions. 6. Supraumbilical ventral hernia contains fat and fluid.  4/24 CTAP IMPRESSION: 1. No evidence pulmonary embolism. 2. Trace bilateral pleural effusions with atelectasis. 3. Thickening of the walls of the gastric antrum. Endoscopy is suggested for further evaluation on follow-up. 4. Morphologic changes of cirrhosis and portal hypertension. 5. Moderate ascites. 6. Cholelithiasis. 7. Aortic atherosclerosis and coronary artery calcifications.  4/24 Abdominal US IMPRESSION: 1.  Distended gallbladder with sludge, suggesting hydrops. No evidence of acute cholecystitis. 2. Distended common bile duct measuring 13 mm. MRCP is suggested for further evaluation. 3. Morphologic changes of cirrhosis. 4. Moderate ascites.   ASSESSMENT  Nathaniel Warren is a 57 y.o. male with a pmh significant for alcoholic cirrhosis (complicated by portal hypertension manifested as ascites), alcohol  use disorder (not in remission), MDD, bipolar, prior seizures, prior CVA.  The patient is seen today for evaluation and management of:  1. Alcoholic cirrhosis of liver with ascites (HCC)   2. Decompensated hepatic cirrhosis (HCC)   3. Portal hypertension (HCC)   4. Anemia, unspecified type   5. Colon cancer screening   6. Posttraumatic stress disorder   7. GAD (generalized anxiety disorder)   8. MDD (major depressive disorder), recurrent severe, without psychosis (HCC)    The patient is hemodynamically stable at this time.  Clinically he is decompensated cirrhosis is stable also.  His MELD score in the hospital was 25.  Based on where things stand, he still has evidence of jaundice and I expect that his liver biochemical testing will not be significantly improved but we will see what his labs look like.  He is in need of upper and lower endoscopy for evaluation of his anemia as well as his colon cancer screening.  From a portal hypertension standpoint ascites still looks to be present on physical exam but he has not shown evidence of need for significant paracenteses in the last few months.  We will continue his current diuretic regimen.  We discussed the goal of a low-sodium diet to monitor his weight closely and to see if anything else changes or develops.  He has now been off all alcohol for nearly a year.  This is been a good change for him and I congratulated him.  Due to his elevated MELD score, I do think it is reasonable, now that he has been abstinent for a year, him to meet one of the transplant  centers to evaluate him unless his MELD score has fallen dramatically.  Will see what his labs show.  He will be due for [facility] screening in November.  The risks and benefits of endoscopic evaluation were discussed with the patient; these include but are not limited to the risk of perforation, infection, bleeding, missed lesions, lack of diagnosis, severe illness requiring hospitalization, as well as anesthesia and sedation related illnesses.  The patient and/or family is agreeable to proceed.  All patient questions were answered to the best of my ability, and the patient agrees to the aforementioned plan of action with follow-up as indicated.   PLAN  #ESLD Management Volume -Continue Lasix 40 mg daily -Continue spironolactone 50 mg daily -Obtain standing weight and monitor at least 3 times a week -1500-2000 mg Na diet Infection -No evidence of SBP on prior and does not have renal insufficiency so no need for prophylaxis Bleeding -Needs EGD at some point for screening Encephalopathy -No treatment currently Screening -Up-to-date and will need updated HCC screening in November 2024 Transplant -If MELD greater than 20 will plan to proceed with referral if patient agrees Vaccination -HAV immune -Needs HPV vaccine if he agrees -Recommend yearly Influenza vaccine -Recommend Pneumococcal vaccine Other -MELD labs to be obtained today -Follow-up further labs to be obtained as outlined below -Promote intake of 1.5 g/kg/day of Protein (Ensures/Boosts supplementation when able) was rediscussed -Will give patient 1 year worth of GI medications -Will give patient 1 month of his other medications until he can establish back with his PCP for further refills -EGD/colonoscopy to be scheduled   Orders Placed This Encounter  Procedures   Heplisav-B (HepB-CPG) Vaccine   CBC   Comp Met (CMET)   B12 and Folate Panel   INR/PT   AFP tumor marker   Ambulatory referral to Gastroenterology    New  Prescriptions   NA SULFATE-K SULFATE-MG SULF 17.5-3.13-1.6 GM/177ML SOLN    Use as directed   Modified Medications   Modified Medication Previous Medication   FUROSEMIDE (LASIX) 40 MG TABLET furosemide (LASIX) 40 MG tablet      Take 1 tablet (40 mg total) by mouth daily.    Take 1 tablet (40 mg total) by mouth daily.   ONDANSETRON (ZOFRAN) 4 MG TABLET ondansetron (ZOFRAN) 4 MG tablet      Take 1 tablet (4 mg total) by mouth every 8 (eight) hours as needed for nausea or vomiting.    Take 1 tablet (4 mg total) by mouth every 8 (eight) hours as needed for nausea or vomiting.   PAROXETINE (PAXIL) 20 MG TABLET PARoxetine (PAXIL) 20 MG tablet      Take 1 tablet (20 mg total) by mouth daily. FURTHER REFILLS TO COME FROM PCP    Take 1 tablet (20 mg total) by mouth daily.   QUETIAPINE (SEROQUEL) 25 MG TABLET QUEtiapine (SEROQUEL) 25 MG tablet      Take 1 tablet (25 mg total) by mouth at bedtime. FURTHER REFILLS TO COME FROM PCP    Take 1 tablet (25 mg total) by mouth at bedtime.   SPIRONOLACTONE (ALDACTONE) 50 MG TABLET spironolactone (ALDACTONE) 50 MG tablet      Take 1 tablet (50 mg total) by mouth in the morning.    Take 1 tablet (50 mg total) by mouth in the morning.    Planned Follow Up No follow-ups on file.   Total Time in Face-to-Face and in Coordination of Care for patient including independent/personal interpretation/review of prior testing, medical history, examination, medication adjustment, communicating results with the patient directly, and documentation within the EHR is 25 minutes.Corliss Parish, MD  Gastroenterology Advanced Endoscopy Office # [phone]

## 2023-05-27 NOTE — Patient Instructions (Addendum)
You have been given your first hepatitis B injection out of 2. You are scheduled for your next injection on 05/29/23 at 9:00 am _________________________________________  DISCONTINUE the midodrine. _________________________________________  We have sent the following medications to your pharmacy for you to pick up at your convenience: Furosemide Spironolactone _________________________________________  We are sending a 1 month prescription for both paroxetine and seroquel. Further refills of this will need to be provided by your primary care physician.  ________________________________________  Please limit salt intake to 2000 mg or less per day. _________________________________________  Bonita Quin have been scheduled for an endoscopy and colonoscopy. Please follow the written instructions given to you at your visit today. Please pick up your prep supplies at the pharmacy within the next 1-3 days. If you use inhalers (even only as needed), please bring them with you on the day of your procedure.  _________________________________________  Bonita Quin will be due for [facility] screening in November 2024. We will contact you with an appointment when it gets closer to that time. __________________________________________  Your provider has requested that you go to the basement level for lab work before leaving today. Press "B" on the elevator. The lab is located at the first door on the left as you exit the elevator.  _______________________________________________________  If your MELD score remaines higher than 20, we may need to refer you to a transplant center.  ___________________________________________________  If your blood pressure at your visit was 140/90 or greater, please contact your primary care physician to follow up on this.  _______________________________________________________  If you are age 55 or older, your body mass index should be between 23-30. Your Body mass index is 27.47  kg/m. If this is out of the aforementioned range listed, please consider follow up with your Primary Care Provider.  If you are age 55 or younger, your body mass index should be between 19-25. Your Body mass index is 27.47 kg/m. If this is out of the aformentioned range listed, please consider follow up with your Primary Care Provider.   ________________________________________________________  The [employer] GI providers would like to encourage you to use [facility] to communicate with providers for non-urgent requests or questions.  Due to long hold times on the telephone, sending your provider a message by [facility] may be a faster and more efficient way to get a response.  Please allow 48 business hours for a response.  Please remember that this is for non-urgent requests.  _______________________________________________________  Due to recent changes in healthcare laws, you may see the results of your imaging and laboratory studies on MyChart before your provider has had a chance to review them.  We understand that in some cases there may be results that are confusing or concerning to you. Not all laboratory results come back in the same time frame and the provider may be waiting for multiple results in order to interpret others.  Please give [place] 48 hours in order for your provider to thoroughly review all the results before contacting the office for clarification of your results.

## 2023-05-28 ENCOUNTER — Encounter: Payer: Self-pay | Admitting: Gastroenterology

## 2023-05-28 DIAGNOSIS — K7460 Unspecified cirrhosis of liver: Secondary | ICD-10-CM | POA: Insufficient documentation

## 2023-05-28 DIAGNOSIS — D649 Anemia, unspecified: Secondary | ICD-10-CM | POA: Insufficient documentation

## 2023-05-28 LAB — AFP TUMOR MARKER: AFP-Tumor Marker: 3.8 ng/mL (ref <6.1)

## 2023-05-29 ENCOUNTER — Ambulatory Visit: Payer: Medicaid Other

## 2023-06-03 ENCOUNTER — Other Ambulatory Visit (HOSPITAL_COMMUNITY): Payer: Self-pay

## 2023-06-04 ENCOUNTER — Telehealth: Payer: Self-pay | Admitting: Gastroenterology

## 2023-06-04 NOTE — Telephone Encounter (Signed)
188 lbs up from 184 lbs. Over the past month.  Feels pressure in the abdomen.  He does not think he needs the paracentesis this week but soon.  I did tell him that Dr Meridee Score is off this week but I will get back with him on Monday.  He is aware that if he gets increased pressure over the weekend and does not think he can wait until Monday he will go to the ED.  He does tell me that he is not uncomfortable at this time but was asked at his recent office visit if he thought he needed and told Dr Meridee Score no but has since thought about it and changed his mind.

## 2023-06-04 NOTE — Telephone Encounter (Signed)
Inbound call from patient stating is in need of a paracentesis procedure. Requesting a call back to discuss steps he needs to take in order to have this done. Please advise, thank you.

## 2023-06-05 NOTE — Telephone Encounter (Signed)
I spoke with the pt and he is going to monitor his weights and call us back next week with an update.  We will make a determination on paracentesis at that time.

## 2023-06-08 ENCOUNTER — Other Ambulatory Visit: Payer: Self-pay

## 2023-06-08 ENCOUNTER — Emergency Department (HOSPITAL_COMMUNITY): Payer: Medicaid Other

## 2023-06-08 ENCOUNTER — Encounter (HOSPITAL_COMMUNITY): Payer: Self-pay | Admitting: Emergency Medicine

## 2023-06-08 ENCOUNTER — Emergency Department (HOSPITAL_COMMUNITY)
Admission: EM | Admit: 2023-06-08 | Discharge: 2023-06-08 | Disposition: A | Discharge status: Home / Self Care | Payer: Medicaid Other | Attending: Emergency Medicine | Admitting: Emergency Medicine

## 2023-06-08 DIAGNOSIS — K7031 Alcoholic cirrhosis of liver with ascites: Secondary | ICD-10-CM | POA: Diagnosis not present

## 2023-06-08 DIAGNOSIS — H1589 Other disorders of sclera: Secondary | ICD-10-CM | POA: Insufficient documentation

## 2023-06-08 DIAGNOSIS — Z1152 Encounter for screening for COVID-19: Secondary | ICD-10-CM | POA: Diagnosis not present

## 2023-06-08 DIAGNOSIS — X509XXA Other and unspecified overexertion or strenuous movements or postures, initial encounter: Secondary | ICD-10-CM | POA: Diagnosis not present

## 2023-06-08 DIAGNOSIS — S9031XA Contusion of right foot, initial encounter: Secondary | ICD-10-CM | POA: Diagnosis not present

## 2023-06-08 DIAGNOSIS — S99921A Unspecified injury of right foot, initial encounter: Secondary | ICD-10-CM

## 2023-06-08 LAB — BODY FLUID CELL COUNT WITH DIFFERENTIAL
Eos, Fluid: 0 %
Lymphs, Fluid: 32 %
Monocyte-Macrophage-Serous Fluid: 56 % (ref 50–90)
Neutrophil Count, Fluid: 1 % (ref 0–25)
Other Cells, Fluid: 11 %
Total Nucleated Cell Count, Fluid: 94 cu mm (ref 0–1000)

## 2023-06-08 LAB — COMPREHENSIVE METABOLIC PANEL
ALT: 27 U/L (ref 0–44)
AST: 76 U/L — ABNORMAL HIGH (ref 15–41)
Albumin: 2.7 g/dL — ABNORMAL LOW (ref 3.5–5.0)
Alkaline Phosphatase: 276 U/L — ABNORMAL HIGH (ref 38–126)
Anion gap: 7 (ref 5–15)
BUN: 13 mg/dL (ref 6–20)
CO2: 25 mmol/L (ref 22–32)
Calcium: 9.1 mg/dL (ref 8.9–10.3)
Chloride: 103 mmol/L (ref 98–111)
Creatinine, Ser: 1.06 mg/dL (ref 0.61–1.24)
GFR, Estimated: >60 mL/min (ref >60)
Glucose, Bld: 106 mg/dL — ABNORMAL HIGH (ref 70–99)
Potassium: 3.9 mmol/L (ref 3.5–5.1)
Sodium: 135 mmol/L (ref 135–145)
Total Bilirubin: 8.9 mg/dL — ABNORMAL HIGH (ref 0.3–1.2)
Total Protein: 7.8 g/dL (ref 6.5–8.1)

## 2023-06-08 LAB — URINALYSIS, W/ REFLEX TO CULTURE (INFECTION SUSPECTED)
Bilirubin Urine: NEGATIVE
Glucose, UA: NEGATIVE mg/dL
Ketones, ur: NEGATIVE mg/dL
Leukocytes,Ua: NEGATIVE
Nitrite: NEGATIVE
Protein, ur: NEGATIVE mg/dL
Specific Gravity, Urine: 1.005 (ref 1.005–1.030)
pH: 5 (ref 5.0–8.0)

## 2023-06-08 LAB — CBC WITH DIFFERENTIAL/PLATELET
Abs Immature Granulocytes: 0.09 10*3/uL — ABNORMAL HIGH (ref 0.00–0.07)
Basophils Absolute: 0.1 10*3/uL (ref 0.0–0.1)
Basophils Relative: 1 %
Eosinophils Absolute: 0.5 10*3/uL (ref 0.0–0.5)
Eosinophils Relative: 4 %
HCT: 34.5 % — ABNORMAL LOW (ref 39.0–52.0)
Hemoglobin: 12.1 g/dL — ABNORMAL LOW (ref 13.0–17.0)
Immature Granulocytes: 1 %
Lymphocytes Relative: 21 %
Lymphs Abs: 2.4 10*3/uL (ref 0.7–4.0)
MCH: 36.3 pg — ABNORMAL HIGH (ref 26.0–34.0)
MCHC: 35.1 g/dL (ref 30.0–36.0)
MCV: 103.6 fL — ABNORMAL HIGH (ref 80.0–100.0)
Monocytes Absolute: 1.8 10*3/uL — ABNORMAL HIGH (ref 0.1–1.0)
Monocytes Relative: 16 %
Neutro Abs: 6.9 10*3/uL (ref 1.7–7.7)
Neutrophils Relative %: 57 %
Platelets: 88 10*3/uL — ABNORMAL LOW (ref 150–400)
RBC: 3.33 MIL/uL — ABNORMAL LOW (ref 4.22–5.81)
RDW: 16.4 % — ABNORMAL HIGH (ref 11.5–15.5)
WBC: 11.8 10*3/uL — ABNORMAL HIGH (ref 4.0–10.5)
nRBC: 0 % (ref 0.0–0.2)

## 2023-06-08 LAB — GRAM STAIN

## 2023-06-08 LAB — GLUCOSE, PLEURAL OR PERITONEAL FLUID: Glucose, Fluid: 116 mg/dL

## 2023-06-08 LAB — SARS CORONAVIRUS 2 BY RT PCR: SARS Coronavirus 2 by RT PCR: NEGATIVE

## 2023-06-08 LAB — ALBUMIN, PLEURAL OR PERITONEAL FLUID: Albumin, Fluid: <1.5 g/dL

## 2023-06-08 LAB — PROTIME-INR
INR: 1.8 — ABNORMAL HIGH (ref 0.8–1.2)
Prothrombin Time: 20.6 seconds — ABNORMAL HIGH (ref 11.4–15.2)

## 2023-06-08 LAB — LACTATE DEHYDROGENASE, PLEURAL OR PERITONEAL FLUID: LD, Fluid: 34 U/L — ABNORMAL HIGH (ref 3–23)

## 2023-06-08 LAB — PROTEIN, PLEURAL OR PERITONEAL FLUID: Total protein, fluid: <3 g/dL

## 2023-06-08 MED ORDER — LIDOCAINE HCL 1 % IJ SOLN
INTRAMUSCULAR | Status: AC
  Filled 2023-06-08: qty 20

## 2023-06-08 MED ORDER — LIDOCAINE HCL (PF) 1 % IJ SOLN
5.0000 mL | Freq: Once | INTRAMUSCULAR | Status: AC
  Administered 2023-06-08: 5 mL via INTRADERMAL
  Filled 2023-06-08: qty 30

## 2023-06-08 MED ORDER — OXYCODONE HCL 5 MG PO TABS: 5.0000 mg | ORAL_TABLET | ORAL | 0 refills | Status: DC | PRN

## 2023-06-08 MED ORDER — MORPHINE SULFATE (PF) 4 MG/ML IV SOLN
4.0000 mg | Freq: Once | INTRAVENOUS | Status: AC
  Administered 2023-06-08: 4 mg via INTRAVENOUS
  Filled 2023-06-08: qty 1

## 2023-06-08 NOTE — ED Provider Notes (Signed)
[place] [facility]ERGENCY DEPARTMENT AT [facility] Provider Note   CSN: [id number] Arrival date & time: 06/08/23  1130     History  Chief Complaint  Patient presents with   Flank Pain   Cirrhosis    Nathaniel Warren is a 57 y.o. male.  Patient is a 57 year old male with a past medical history of alcohol cirrhosis presenting to the emergency department with flank pain and abdominal distention.  He states over the last few days he has had bilateral flank pain.  He states that his abdomen has felt more swollen since Thursday.  He denies any fevers or chills, nausea or vomiting.  He states that he did notice some swelling to the left side of his neck and does report a mild cough.  He states that he has had some left-sided ear pain but denies any sore throat.  He states that he has had frequent loose stools but denies any black or bloody stools.  He he denies any dysuria or hematuria but does report urinary frequency.  Of note, the patient states that over the weekend he also twisted his foot and has noticed bruising to his foot.  He states that he has chronic numbness in this foot so has not noticed significant pain.  The history is provided by the patient.  Flank Pain       Home Medications Prior to Admission medications   Medication Sig Start Date End Date Taking? Authorizing Provider  cyclobenzaprine (FLEXERIL) 5 MG tablet Take 1 tablet (5 mg total) by mouth 3 (three) times daily as needed for muscle spasms. 04/12/23   Marinda Elk, MD  furosemide (LASIX) 40 MG tablet Take 1 tablet (40 mg total) by mouth daily. 05/27/23   Mansouraty, Netty Starring., MD  Na Sulfate-K Sulfate-Mg Sulf 17.5-3.13-1.6 GM/177ML SOLN Use as directed 05/27/23   Mansouraty, Netty Starring., MD  ondansetron (ZOFRAN) 4 MG tablet Take 1 tablet (4 mg total) by mouth every 8 (eight) hours as needed for nausea or vomiting. 05/27/23   Mansouraty, Netty Starring., MD  PARoxetine (PAXIL) 20 MG tablet Take 1 tablet (20 mg  total) by mouth daily. FURTHER REFILLS TO COME FROM PCP 05/27/23 07/26/23  Mansouraty, Netty Starring., MD  QUEtiapine (SEROQUEL) 25 MG tablet Take 1 tablet (25 mg total) by mouth at bedtime. FURTHER REFILLS TO COME FROM PCP 05/27/23   Mansouraty, Netty Starring., MD  spironolactone (ALDACTONE) 50 MG tablet Take 1 tablet (50 mg total) by mouth in the morning. 05/27/23   Mansouraty, Netty Starring., MD      Allergies    Patient has no known allergies.    Review of Systems   Review of Systems  Genitourinary:  Positive for flank pain.    Physical Exam Updated Vital Signs BP 112/62   Pulse 79   Temp 98.1 F (36.7 C) (Oral)   Resp (!) 8   Ht 5\' 9"  (1.753 m)   Wt 84 kg   SpO2 99%   BMI 27.35 kg/m  Physical Exam Vitals and nursing note reviewed.  Constitutional:      General: He is not in acute distress.    Appearance: Normal appearance.  HENT:     Head: Normocephalic and atraumatic.     Nose: Nose normal.     Mouth/Throat:     Mouth: Mucous membranes are moist.     Pharynx: Oropharynx is clear.  Eyes:     General: Scleral icterus present.     Extraocular Movements: Extraocular  movements intact.     Conjunctiva/sclera: Conjunctivae normal.  Cardiovascular:     Rate and Rhythm: Normal rate and regular rhythm.     Heart sounds: Normal heart sounds.  Pulmonary:     Effort: Pulmonary effort is normal.     Breath sounds: Normal breath sounds.  Abdominal:     General: Abdomen is flat. There is distension (palpable fluid wave).     Palpations: Abdomen is soft.     Tenderness: There is right CVA tenderness (Mild) and left CVA tenderness (Mild). There is no guarding or rebound.  Musculoskeletal:        General: Tenderness (R ankle, no tenderness to R knee) present. Normal range of motion.     Cervical back: Normal range of motion and neck supple.  Skin:    General: Skin is warm and dry.     Coloration: Skin is jaundiced.     Findings: Bruising (Base of R 2nd-5th toes) present.  Neurological:      General: No focal deficit present.     Mental Status: He is alert and oriented to person, place, and time.  Psychiatric:        Mood and Affect: Mood normal.        Behavior: Behavior normal.     ED Results / Procedures / Treatments   Labs (all labs ordered are listed, but only abnormal results are displayed) Labs Reviewed  COMPREHENSIVE METABOLIC PANEL - Abnormal; Notable for the following components:      Result Value   Glucose, Bld 106 (*)    Albumin 2.7 (*)    AST 76 (*)    Alkaline Phosphatase 276 (*)    Total Bilirubin 8.9 (*)    All other components within normal limits  CBC WITH DIFFERENTIAL/PLATELET - Abnormal; Notable for the following components:   WBC 11.8 (*)    RBC 3.33 (*)    Hemoglobin 12.1 (*)    HCT 34.5 (*)    MCV 103.6 (*)    MCH 36.3 (*)    RDW 16.4 (*)    Platelets 88 (*)    Monocytes Absolute 1.8 (*)    Abs Immature Granulocytes 0.09 (*)    All other components within normal limits  PROTIME-INR - Abnormal; Notable for the following components:   Prothrombin Time 20.6 (*)    INR 1.8 (*)    All other components within normal limits  URINALYSIS, W/ REFLEX TO CULTURE (INFECTION SUSPECTED) - Abnormal; Notable for the following components:   Hgb urine dipstick SMALL (*)    Bacteria, UA RARE (*)    All other components within normal limits  SARS CORONAVIRUS 2 BY RT PCR  BODY FLUID CULTURE W GRAM STAIN  LACTATE DEHYDROGENASE, PLEURAL OR PERITONEAL FLUID  GLUCOSE, PLEURAL OR PERITONEAL FLUID  PROTEIN, PLEURAL OR PERITONEAL FLUID  ALBUMIN, PLEURAL OR PERITONEAL FLUID   BODY FLUID CELL COUNT WITH DIFFERENTIAL    EKG None  Radiology CT Renal Stone Study  Result Date: 06/08/2023 CLINICAL DATA:  Abdominal/flank pain, stone suspected EXAM: CT ABDOMEN AND PELVIS WITHOUT CONTRAST TECHNIQUE: Multidetector CT imaging of the abdomen and pelvis was performed following the standard protocol without IV contrast. RADIATION DOSE REDUCTION: This exam was  performed according to the departmental dose-optimization program which includes automated exposure control, adjustment of the mA and/or kV according to patient size and/or use of iterative reconstruction technique. COMPARISON:  CT examination dated April 05, 2023 FINDINGS: Lower chest: No acute abnormality. Hepatobiliary: Nodular contour of the  liver with hypertrophic left hepatic lobe consistent with cirrhosis. Gallbladder is markedly distended. Small gallstone in the dependent gallbladder without gallbladder wall thickening, or biliary dilatation. Pancreas: Unremarkable. No pancreatic ductal dilatation or surrounding inflammatory changes. Spleen: Normal in size without focal abnormality. Adrenals/Urinary Tract: Adrenal glands are unremarkable. Kidneys are normal, without renal calculi, focal lesion, or hydronephrosis. Bladder is unremarkable. Stomach/Bowel: Stomach is within normal limits. Appendix appears normal. No evidence of bowel wall thickening, distention, or inflammatory changes. Vascular/Lymphatic: Aortic atherosclerosis. No enlarged abdominal or pelvic lymph nodes. Reproductive: Prostate is unremarkable. Other: Moderate ascites. Small left inguinal hernia containing fluid. Small supraumbilical fat containing ventral hernia. Mild generalized anasarca with skin thickening about the umbilicus. Musculoskeletal: Multilevel degenerate disc disease of the lumbar spine. No acute osseous abnormality IMPRESSION: 1. Cirrhotic morphology of the liver with moderate ascites. 2. Cholelithiasis without evidence of acute cholecystitis. 3. No evidence of nephrolithiasis or hydronephrosis. 4. Normal appendix. No evidence of colitis or diverticulitis. 5. Small left inguinal hernia containing fluid. 6. Mild generalized anasarca with skin thickening about the umbilicus. 7. Multilevel degenerate disc disease of the lumbar spine. 8. Aortic atherosclerosis. Aortic Atherosclerosis (ICD10-I70.0). Electronically Signed   By: Larose Hires D.O.   On: 06/08/2023 13:41   DG Chest 2 View  Result Date: 06/08/2023 CLINICAL DATA:  Cough EXAM: CHEST - 2 VIEW COMPARISON:  X-ray 04/10/2023 FINDINGS: Hyperinflation. No consolidation, pneumothorax or effusion. No edema. Prominent central vasculature. Normal cardiopericardial silhouette. Overlapping cardiac leads. IMPRESSION: Hyperinflation. Borderline size heart with some central vascular congestion Electronically Signed   By: Karen Kays M.D.   On: 06/08/2023 12:54   DG Foot Complete Right  Result Date: 06/08/2023 CLINICAL DATA:  Pain after injury EXAM: RIGHT FOOT COMPLETE - 3 VIEW; RIGHT ANKLE - COMPLETE 3 VIEW COMPARISON:  Ankle x-ray 12/02/2022 FINDINGS: Severe progressive osteopenia identified. Chronic changes about the ankle with lateral fixation plate and screws along the distal fibula as well as oblique screws along the lateral and both medial malleoli. Of the ankle there is no acute fracture or dislocation. Preserved joint spaces. On the foot there is deformity of the neck of the fifth metatarsal. This could be chronic based on appearance. Degenerative changes of the first metatarsophalangeal joint. No separate acute fracture or dislocation. IMPRESSION: Progressive severe osteopenia with ORIF of the distal tibia and fibula. Stable hardware. Traumatic deformity of the neck of fifth metatarsal. Based on appearance favor this being more chronic. Please correlate with any prior to confirm etiology and age. Degenerative changes of the first metatarsophalangeal joint. Electronically Signed   By: Karen Kays M.D.   On: 06/08/2023 12:53   DG Ankle Complete Right  Result Date: 06/08/2023 CLINICAL DATA:  Pain after injury EXAM: RIGHT FOOT COMPLETE - 3 VIEW; RIGHT ANKLE - COMPLETE 3 VIEW COMPARISON:  Ankle x-ray 12/02/2022 FINDINGS: Severe progressive osteopenia identified. Chronic changes about the ankle with lateral fixation plate and screws along the distal fibula as well as oblique screws  along the lateral and both medial malleoli. Of the ankle there is no acute fracture or dislocation. Preserved joint spaces. On the foot there is deformity of the neck of the fifth metatarsal. This could be chronic based on appearance. Degenerative changes of the first metatarsophalangeal joint. No separate acute fracture or dislocation. IMPRESSION: Progressive severe osteopenia with ORIF of the distal tibia and fibula. Stable hardware. Traumatic deformity of the neck of fifth metatarsal. Based on appearance favor this being more chronic. Please correlate with any prior to confirm  etiology and age. Degenerative changes of the first metatarsophalangeal joint. Electronically Signed   By: Karen Kays M.D.   On: 06/08/2023 12:53    Procedures .Paracentesis  Date/Time: 06/08/2023 2:47 PM  Performed by: Rexford Maus, DO Authorized by: Rexford Maus, DO   Consent:    Consent obtained:  Verbal   Consent given by:  Patient   Risks, benefits, and alternatives were discussed: yes     Risks discussed:  Bleeding, bowel perforation, infection and pain   Alternatives discussed:  No treatment and delayed treatment Universal protocol:    Procedure explained and questions answered to patient or proxy's satisfaction: yes     Patient identity confirmed:  Verbally with patient Pre-procedure details:    Procedure purpose:  Diagnostic Anesthesia:    Anesthesia method:  Local infiltration   Local anesthetic:  Lidocaine 1% w/o epi Procedure details:    Needle gauge:  18   Ultrasound guidance: yes     Puncture site:  R lower quadrant   Fluid removed amount:  60 cc   Fluid appearance:  Yellow and clear   Dressing:  Adhesive bandage Post-procedure details:    Procedure completion:  Tolerated well, no immediate complications Ultrasound ED Abd  Date/Time: 06/08/2023 3:02 PM  Performed by: Rexford Maus, DO Authorized by: Rexford Maus, DO   Procedure details:    Indications:  abdominal pain     Indications comment:  Abd distension, history of cirrhosis   Assessment for:  Intra-abdominal fluid    Images: archived    Comments:     Evaluated RLQ, LLQ for ascites, Positive for large fluid pocket     Medications Ordered in ED Medications  morphine (PF) 4 MG/ML injection 4 mg (4 mg Intravenous Given 06/08/23 1252)  lidocaine (PF) (XYLOCAINE) 1 % injection 5 mL (5 mLs Intradermal Given by Other 06/08/23 1353)    ED Course/ Medical Decision Making/ A&P Clinical Course as of 06/08/23 1545  Mon Jun 08, 2023  1309 Deformity of distal 5th metatarsal, appears chronic but with acute injury and pain will be given hard sole shoe. [VK]  1358 Ascites, otherwise no significant abnormality on CTAP. [VK]  1544 IR will evaluate the patient to see if they are available for therapeutic tap today. Patient signed out to Dr. Fredderick Phenix pending fluid analysis. [VK]    Clinical Course User Index [VK] Rexford Maus, DO                             Medical Decision Making This patient presents to the ED with chief complaint(s) of flank pain, R foot pain, abd distention with pertinent past medical history of cirrhosis which further complicates the presenting complaint. The complaint involves an extensive differential diagnosis and also carries with it a high risk of complications and morbidity.    The differential diagnosis includes pyelonephritis, nephrolithiasis, SBP, ascites, electrolyte abnormality, foot fracture, ankle fracture, foot or ankle sprain, pneumonia, pleural effusion, pulmonary edema, viral syndrome  Additional history obtained: Additional history obtained from N/A Records reviewed outpatient GI records  ED Course and Reassessment: On patient's arrival to the emergency department he is hemodynamically stable in no acute distress.  He does have bruising to the right foot and will have an x-ray of his foot and ankle performed.  The patient will have labs including LFTs  and PT/INR and urine as well as CT stone search and chest x-ray to evaluate  for cause of his flank pain.  Patient will have bedside ultrasound to evaluate for ascites and likely need paracentesis to evaluate for SBP.  Independent labs interpretation:  The following labs were independently interpreted: mild leukocytosis, bilirubin and INR about at patient's baseline  Independent visualization of imaging: - I independently visualized the following imaging with scope of interpretation limited to determining acute life threatening conditions related to emergency care: CTAP, which revealed ascites, otherwise no acute abnormalities  Consultation: - Consulted or discussed management/test interpretation w/ external professional: IR      Amount and/or Complexity of Data Reviewed Labs: ordered. Radiology: ordered.  Risk Prescription drug management.          Final Clinical Impression(s) / ED Diagnoses Final diagnoses:  Ascites due to alcoholic cirrhosis (HCC)  Injury of right foot, initial encounter    Rx / DC Orders ED Discharge Orders     None         Rexford Maus, DO 06/08/23 1545

## 2023-06-08 NOTE — Discharge Instructions (Addendum)
Make an appointment to follow-up with your primary care doctor.  Also make an appointment to follow-up with your orthopedist regarding your foot fracture.  Return to the emergency room if you have any worsening symptoms.

## 2023-06-08 NOTE — Progress Notes (Signed)
Orthopedic Tech Progress Note Patient Details:  BLEU REICHEL 07/13/1966 [id number]  Applied postop shoe/boot Ortho Devices Type of Ortho Device: Postop shoe/boot Ortho Device/Splint Location: RLE Ortho Device/Splint Interventions: Ordered, Application, Adjustment   Post Interventions Patient Tolerated: Well Instructions Provided: Care of device, Adjustment of device  Sherilyn Banker 06/08/2023, 1:50 PM

## 2023-06-08 NOTE — ED Provider Notes (Signed)
Care was taken over from Dr. Theresia Lo awaiting results from his ascites fluid aspiration.  There is no suggestions of infection on the fluid.  He requested a therapeutic paracentesis.  We were able to arrange this through IR.  He was discharged home in good condition.  He has a cam walker on for his metatarsal fracture.  He says he has an orthopedist that he will follow-up with.  He was discharged home in good condition.  Return precautions were given.   Rolan Bucco, MD 06/08/23 1806

## 2023-06-08 NOTE — ED Triage Notes (Signed)
Pt reports flare up of cirrhosis. Endorses 3 days of bilateral flank pain. Hx of kidney stones but feels different. Also reports left sided neck swelling and pain starting today. Denies recent fall but right leg  and foot pain after twisting weird.

## 2023-06-08 NOTE — Procedures (Addendum)
PROCEDURE SUMMARY:  Successful ultrasound guided therapeutic paracentesis from the right mid to lower quadrant.  Yielded 1.3 liters of golden yellow fluid.  No immediate complications.  The patient tolerated the procedure well.   Specimen was not sent for labs.  Diagnostic paracentesis performed earlier today by emergency room MD  EBL none  If the patient eventually requires >/=2 paracenteses in a 30 day period, screening evaluation by the [facility] will be assessed.   Artemio Aly

## 2023-06-09 ENCOUNTER — Telehealth: Payer: Self-pay

## 2023-06-09 NOTE — Transitions of Care (Post Inpatient/ED Visit) (Cosign Needed)
   06/09/2023  Name: Nathaniel Warren MRN: [id number] DOB: Nov 12, 1966  Today's TOC FU Call Status: Today's TOC FU Call Status:: Successful TOC FU Call Competed TOC FU Call Complete Date: 06/09/23  Transition Care Management Follow-up Telephone Call Date of Discharge: 06/08/23 Discharge Facility: [facility]) Type of Discharge: Emergency Department Reason for ED Visit: Orthopedic Conditions Orthopedic/Injury Diagnosis: Sprain or Strain How have you been since you were released from the hospital?: Better Any questions or concerns?: No  Items Reviewed: Did you receive and understand the discharge instructions provided?: Yes Medications obtained,verified, and reconciled?: Yes (Medications Reviewed) Any new allergies since your discharge?: No Dietary orders reviewed?: NA Do you have support at home?: Yes People in Home: friend(s)  Medications Reviewed Today: Medications Reviewed Today     Reviewed by Renelda Loma, RMA (Registered Medical Assistant) on 06/09/23 at 1423  Med List Status: <None>   Medication Order Taking? Sig Documenting Provider Last Dose Status Informant  cyclobenzaprine (FLEXERIL) 5 MG tablet [id number] No Take 1 tablet (5 mg total) by mouth 3 (three) times daily as needed for muscle spasms.  Patient not taking: Reported on 06/09/2023   Marinda Elk, MD Not Taking Active   furosemide (LASIX) 40 MG tablet [id number] Yes Take 1 tablet (40 mg total) by mouth daily. Mansouraty, Netty Starring., MD Taking Active   Na Sulfate-K Sulfate-Mg Sulf 17.5-3.13-1.6 GM/177ML SOLN [id number] No Use as directed  Patient not taking: Reported on 06/09/2023   Mansouraty, Netty Starring., MD Not Taking Active   ondansetron [facility]) 4 MG tablet [id number] Yes Take 1 tablet (4 mg total) by mouth every 8 (eight) hours as needed for nausea or vomiting. Mansouraty, Netty Starring., MD Taking Active   oxyCODONE (ROXICODONE) 5 MG immediate release tablet [id number] Yes Take 1 tablet (5 mg total) by mouth  every 4 (four) hours as needed for severe pain. Rolan Bucco, MD Taking Active   PARoxetine (PAXIL) 20 MG tablet [id number] Yes Take 1 tablet (20 mg total) by mouth daily. FURTHER REFILLS TO COME FROM PCP Mansouraty, Netty Starring., MD Taking Active   QUEtiapine (SEROQUEL) 25 MG tablet [id number] Yes Take 1 tablet (25 mg total) by mouth at bedtime. FURTHER REFILLS TO COME FROM PCP Mansouraty, Netty Starring., MD Taking Active   spironolactone (ALDACTONE) 50 MG tablet [id number] Yes Take 1 tablet (50 mg total) by mouth in the morning. Mansouraty, Netty Starring., MD Taking Active             Home Care and Equipment/Supplies: Any new equipment or medical supplies ordered?: Yes Name of Medical supply agency?: [employer] Were you able to get the equipment/medical supplies?: Yes  Functional Questionnaire: Do you need assistance with bathing/showering or dressing?: No Do you need assistance with meal preparation?: No Do you need assistance with eating?: No Do you have difficulty maintaining continence: No Do you need assistance with getting out of bed/getting out of a chair/moving?: No Do you have difficulty managing or taking your medications?: No  Follow up appointments reviewed: PCP Follow-up appointment confirmed?: Yes Date of PCP follow-up appointment?: 06/26/23 Follow-up Provider: [facility] Follow-up appointment confirmed?: Yes Date of Specialist follow-up appointment?: 06/10/23 Do you need transportation to your follow-up appointment?: No    SIGNATURE. Renelda Loma RMA

## 2023-06-10 ENCOUNTER — Ambulatory Visit: Payer: Medicaid Other | Attending: Neurology | Admitting: Physical Therapy

## 2023-06-10 LAB — PATHOLOGIST SMEAR REVIEW

## 2023-06-13 LAB — CULTURE, BODY FLUID W GRAM STAIN -BOTTLE: Culture: NO GROWTH

## 2023-06-26 ENCOUNTER — Ambulatory Visit (INDEPENDENT_AMBULATORY_CARE_PROVIDER_SITE_OTHER): Payer: Medicaid Other | Admitting: Nurse Practitioner

## 2023-06-26 ENCOUNTER — Encounter: Payer: Self-pay | Admitting: Nurse Practitioner

## 2023-06-26 VITALS — BP 104/57 | HR 83 | Ht 69.0 in | Wt 188.0 lb

## 2023-06-26 DIAGNOSIS — L03012 Cellulitis of left finger: Secondary | ICD-10-CM | POA: Diagnosis not present

## 2023-06-26 DIAGNOSIS — N61 Mastitis without abscess: Secondary | ICD-10-CM | POA: Diagnosis not present

## 2023-06-26 DIAGNOSIS — L819 Disorder of pigmentation, unspecified: Secondary | ICD-10-CM | POA: Diagnosis not present

## 2023-06-26 DIAGNOSIS — R8589 Other abnormal findings in specimens from digestive organs and abdominal cavity: Secondary | ICD-10-CM

## 2023-06-26 MED ORDER — OXYCODONE HCL 5 MG PO TABS: 5.0000 mg | ORAL_TABLET | ORAL | 0 refills | Status: DC | PRN

## 2023-06-26 MED ORDER — CEPHALEXIN 500 MG PO CAPS
500.0000 mg | ORAL_CAPSULE | Freq: Two times a day (BID) | ORAL | 0 refills | Status: AC
Start: 2023-06-26 — End: 2023-07-06

## 2023-06-26 MED ORDER — CYCLOBENZAPRINE HCL 5 MG PO TABS: 5.0000 mg | ORAL_TABLET | Freq: Three times a day (TID) | ORAL | 0 refills | Status: DC | PRN

## 2023-06-26 MED ORDER — QUETIAPINE FUMARATE 25 MG PO TABS: 25.0000 mg | ORAL_TABLET | Freq: Every day | ORAL | 0 refills | Status: DC

## 2023-06-26 NOTE — Patient Instructions (Addendum)
1. Infection of nail bed of finger of left hand  - Ambulatory referral to Orthopedic Surgery    2. Mastitis in male  - cephALEXin (KEFLEX) 500 MG capsule; Take 1 capsule (500 mg total) by mouth 2 (two) times daily for 10 days.  Dispense: 20 capsule; Refill: 0    3. Discoloration of skin  - US Venous Img Upper Uni Right(DVT)     Follow up:  Follow up in 3 months

## 2023-06-26 NOTE — Progress Notes (Signed)
@Patient  ID: Nathaniel Warren, male    DOB: [birth year], 57 y.o.   MRN: [id number]  Chief Complaint  Patient presents with   Follow-up    Over all health    Referring provider: Ivonne Andrew, NP   HPI  Patient presents today for a follow-up visit.  He does have multiple issues today.  He states that he does have an infection in his nailbed and finger to left hand.  Patient has had this in the past and had to see hand specialist.  We will refer him back to orthopedic surgery/hand specialty today.  Patient also complains of right chest/breast swelling and pain.  We will order antibiotic for this.  Patient also complains of discoloration of skin to right upper extremity.  We will order ultrasound of extremity.     No Known Allergies  Immunization History  Administered Date(s) Administered   Hepb-cpg 05/27/2023   Influenza Split 01/10/2013   Influenza,inj,Quad PF,6+ Mos 03/20/2016, 10/25/2017   Pneumococcal Polysaccharide-23 10/25/2017    Past Medical History:  Diagnosis Date   Cirrhosis (HCC)    Depression    Drug overdose 10/20/2017   Drug overdose, intentional self-harm, initial encounter (HCC) 10/20/2017   Drug overdose, intentional, initial encounter (HCC) 10/20/2017   Hernia, abdominal    Liver failure (HCC)    Seizures (HCC)    Stroke (HCC)    Tobacco abuse     Tobacco History: Social History   Tobacco Use  Smoking Status Every Day   Current packs/day: 1.00   Average packs/day: 1 pack/day for 30.0 years (30.0 ttl pk-yrs)   Types: Cigarettes  Smokeless Tobacco Former   Ready to quit: Not Answered Counseling given: Not Answered   Outpatient Encounter Medications as of 06/26/2023  Medication Sig   cephALEXin (KEFLEX) 500 MG capsule Take 1 capsule (500 mg total) by mouth 2 (two) times daily for 10 days.   furosemide (LASIX) 40 MG tablet Take 1 tablet (40 mg total) by mouth daily.   ondansetron (ZOFRAN) 4 MG tablet Take 1 tablet (4 mg total) by mouth every  8 (eight) hours as needed for nausea or vomiting.   PARoxetine (PAXIL) 20 MG tablet Take 1 tablet (20 mg total) by mouth daily. FURTHER REFILLS TO COME FROM PCP   spironolactone (ALDACTONE) 50 MG tablet Take 1 tablet (50 mg total) by mouth in the morning.   [DISCONTINUED] oxyCODONE (ROXICODONE) 5 MG immediate release tablet Take 1 tablet (5 mg total) by mouth every 4 (four) hours as needed for severe pain.   [DISCONTINUED] QUEtiapine (SEROQUEL) 25 MG tablet Take 1 tablet (25 mg total) by mouth at bedtime. FURTHER REFILLS TO COME FROM PCP   cyclobenzaprine (FLEXERIL) 5 MG tablet Take 1 tablet (5 mg total) by mouth 3 (three) times daily as needed for muscle spasms.   Na Sulfate-K Sulfate-Mg Sulf 17.5-3.13-1.6 GM/177ML SOLN Use as directed (Patient not taking: Reported on 06/09/2023)   oxyCODONE (ROXICODONE) 5 MG immediate release tablet Take 1 tablet (5 mg total) by mouth every 4 (four) hours as needed for severe pain.   QUEtiapine (SEROQUEL) 25 MG tablet Take 1 tablet (25 mg total) by mouth at bedtime. FURTHER REFILLS TO COME FROM PCP   [DISCONTINUED] cyclobenzaprine (FLEXERIL) 5 MG tablet Take 1 tablet (5 mg total) by mouth 3 (three) times daily as needed for muscle spasms. (Patient not taking: Reported on 06/09/2023)   No facility-administered encounter medications on file as of 06/26/2023.     Review of Systems  Review of Systems  Constitutional: Negative.   HENT: Negative.    Cardiovascular: Negative.   Gastrointestinal: Negative.   Skin:  Positive for color change (right arn).       Swelling and tenderness to right breast  Allergic/Immunologic: Negative.   Neurological: Negative.   Psychiatric/Behavioral: Negative.         Physical Exam  BP (!) 104/57   Pulse 83   Ht 5\' 9"  (1.753 m)   Wt 188 lb (85.3 kg)   SpO2 99%   BMI 27.76 kg/m   Wt Readings from Last 5 Encounters:  06/26/23 188 lb (85.3 kg)  06/08/23 185 lb 3 oz (84 kg)  05/27/23 186 lb (84.4 kg)  04/13/23 182 lb  (82.6 kg)  04/12/23 175 lb 14.8 oz (79.8 kg)     Physical Exam Vitals and nursing note reviewed.  Constitutional:      General: He is not in acute distress.    Appearance: He is well-developed.  Cardiovascular:     Rate and Rhythm: Normal rate and regular rhythm.  Pulmonary:     Effort: Pulmonary effort is normal.     Breath sounds: Normal breath sounds.  Chest:     Chest wall: Tenderness present.  Breasts:    Right: Swelling and tenderness present.    Skin:    General: Skin is warm and dry.  Neurological:     Mental Status: He is alert and oriented to person, place, and time.      Lab Results:  CBC    Component Value Date/Time   WBC 11.8 (H) 06/08/2023 1253   RBC 3.33 (L) 06/08/2023 1253   HGB 12.1 (L) 06/08/2023 1253   HGB 12.0 (L) 12/18/2022 1222   HCT 34.5 (L) 06/08/2023 1253   HCT 31.9 (L) 12/18/2022 1222   PLT 88 (L) 06/08/2023 1253   PLT 115 (L) 12/18/2022 1222   MCV 103.6 (H) 06/08/2023 1253   MCV 96 12/18/2022 1222   MCH 36.3 (H) 06/08/2023 1253   MCHC 35.1 06/08/2023 1253   RDW 16.4 (H) 06/08/2023 1253   RDW 21.7 (H) 12/18/2022 1222   LYMPHSABS 2.4 06/08/2023 1253   MONOABS 1.8 (H) 06/08/2023 1253   EOSABS 0.5 06/08/2023 1253   BASOSABS 0.1 06/08/2023 1253    BMET    Component Value Date/Time   NA 135 06/08/2023 1253   NA 135 12/18/2022 1222   K 3.9 06/08/2023 1253   CL 103 06/08/2023 1253   CO2 25 06/08/2023 1253   GLUCOSE 106 (H) 06/08/2023 1253   BUN 13 06/08/2023 1253   BUN 9 12/18/2022 1222   CREATININE 1.06 06/08/2023 1253   CALCIUM 9.1 06/08/2023 1253   GFRNONAA >60 06/08/2023 1253   GFRAA >60 01/26/2018 0617    BNP    Component Value Date/Time   BNP 161.2 (H) 01/22/2023 1928    ProBNP No results found for: "PROBNP"  Imaging: US Paracentesis  Result Date: 06/08/2023 INDICATION: Patient with history of alcoholic cirrhosis, recurrent ascites. Request received for therapeutic paracentesis; diagnostic paracentesis  performed earlier today by emergency room MD. EXAM: ULTRASOUND GUIDED THERAPEUTIC PARACENTESIS MEDICATIONS: 8 mL 1% lidocaine COMPLICATIONS: None immediate. PROCEDURE: Informed written consent was obtained from the patient after a discussion of the risks, benefits and alternatives to treatment. A timeout was performed prior to the initiation of the procedure. Initial ultrasound scanning demonstrates a small amount of ascites within the right mid to lower abdominal quadrant. The right mid to lower abdomen was prepped and  draped in the usual sterile fashion. 1% lidocaine was used for local anesthesia. Following this, a 19 gauge, 7-cm, Yueh catheter was introduced. An ultrasound image was saved for documentation purposes. The paracentesis was performed. The catheter was removed and a dressing was applied. The patient tolerated the procedure well without immediate post procedural complication. FINDINGS: A total of approximately 1.3 liters of golden yellow fluid was removed. IMPRESSION: Successful ultrasound-guided therapeutic paracentesis yielding 1.3 liters of peritoneal fluid. PLAN: If the patient eventually requires >/=2 paracenteses in a 30 day period, candidacy for formal evaluation by the [facility] will be assessed. Performed by: Artemio Aly Electronically Signed   By: Malachy Moan M.D.   On: 06/08/2023 17:18   CT Renal Stone Study  Result Date: 06/08/2023 CLINICAL DATA:  Abdominal/flank pain, stone suspected EXAM: CT ABDOMEN AND PELVIS WITHOUT CONTRAST TECHNIQUE: Multidetector CT imaging of the abdomen and pelvis was performed following the standard protocol without IV contrast. RADIATION DOSE REDUCTION: This exam was performed according to the departmental dose-optimization program which includes automated exposure control, adjustment of the mA and/or kV according to patient size and/or use of iterative reconstruction technique. COMPARISON:  CT  examination dated April 05, 2023 FINDINGS: Lower chest: No acute abnormality. Hepatobiliary: Nodular contour of the liver with hypertrophic left hepatic lobe consistent with cirrhosis. Gallbladder is markedly distended. Small gallstone in the dependent gallbladder without gallbladder wall thickening, or biliary dilatation. Pancreas: Unremarkable. No pancreatic ductal dilatation or surrounding inflammatory changes. Spleen: Normal in size without focal abnormality. Adrenals/Urinary Tract: Adrenal glands are unremarkable. Kidneys are normal, without renal calculi, focal lesion, or hydronephrosis. Bladder is unremarkable. Stomach/Bowel: Stomach is within normal limits. Appendix appears normal. No evidence of bowel wall thickening, distention, or inflammatory changes. Vascular/Lymphatic: Aortic atherosclerosis. No enlarged abdominal or pelvic lymph nodes. Reproductive: Prostate is unremarkable. Other: Moderate ascites. Small left inguinal hernia containing fluid. Small supraumbilical fat containing ventral hernia. Mild generalized anasarca with skin thickening about the umbilicus. Musculoskeletal: Multilevel degenerate disc disease of the lumbar spine. No acute osseous abnormality IMPRESSION: 1. Cirrhotic morphology of the liver with moderate ascites. 2. Cholelithiasis without evidence of acute cholecystitis. 3. No evidence of nephrolithiasis or hydronephrosis. 4. Normal appendix. No evidence of colitis or diverticulitis. 5. Small left inguinal hernia containing fluid. 6. Mild generalized anasarca with skin thickening about the umbilicus. 7. Multilevel degenerate disc disease of the lumbar spine. 8. Aortic atherosclerosis. Aortic Atherosclerosis (ICD10-I70.0). Electronically Signed   By: Larose Hires D.O.   On: 06/08/2023 13:41   DG Chest 2 View  Result Date: 06/08/2023 CLINICAL DATA:  Cough EXAM: CHEST - 2 VIEW COMPARISON:  X-ray 04/10/2023 FINDINGS: Hyperinflation. No consolidation, pneumothorax or effusion. No  edema. Prominent central vasculature. Normal cardiopericardial silhouette. Overlapping cardiac leads. IMPRESSION: Hyperinflation. Borderline size heart with some central vascular congestion Electronically Signed   By: Karen Kays M.D.   On: 06/08/2023 12:54   DG Foot Complete Right  Result Date: 06/08/2023 CLINICAL DATA:  Pain after injury EXAM: RIGHT FOOT COMPLETE - 3 VIEW; RIGHT ANKLE - COMPLETE 3 VIEW COMPARISON:  Ankle x-ray 12/02/2022 FINDINGS: Severe progressive osteopenia identified. Chronic changes about the ankle with lateral fixation plate and screws along the distal fibula as well as oblique screws along the lateral and both medial malleoli. Of the ankle there is no acute fracture or dislocation. Preserved joint spaces. On the foot there is deformity of the neck of the fifth metatarsal. This could be chronic based on appearance. Degenerative changes of  the first metatarsophalangeal joint. No separate acute fracture or dislocation. IMPRESSION: Progressive severe osteopenia with ORIF of the distal tibia and fibula. Stable hardware. Traumatic deformity of the neck of fifth metatarsal. Based on appearance favor this being more chronic. Please correlate with any prior to confirm etiology and age. Degenerative changes of the first metatarsophalangeal joint. Electronically Signed   By: Karen Kays M.D.   On: 06/08/2023 12:53   DG Ankle Complete Right  Result Date: 06/08/2023 CLINICAL DATA:  Pain after injury EXAM: RIGHT FOOT COMPLETE - 3 VIEW; RIGHT ANKLE - COMPLETE 3 VIEW COMPARISON:  Ankle x-ray 12/02/2022 FINDINGS: Severe progressive osteopenia identified. Chronic changes about the ankle with lateral fixation plate and screws along the distal fibula as well as oblique screws along the lateral and both medial malleoli. Of the ankle there is no acute fracture or dislocation. Preserved joint spaces. On the foot there is deformity of the neck of the fifth metatarsal. This could be chronic based on  appearance. Degenerative changes of the first metatarsophalangeal joint. No separate acute fracture or dislocation. IMPRESSION: Progressive severe osteopenia with ORIF of the distal tibia and fibula. Stable hardware. Traumatic deformity of the neck of fifth metatarsal. Based on appearance favor this being more chronic. Please correlate with any prior to confirm etiology and age. Degenerative changes of the first metatarsophalangeal joint. Electronically Signed   By: Karen Kays M.D.   On: 06/08/2023 12:53     Assessment & Plan:   Infection of nail bed of finger of left hand - Ambulatory referral to Orthopedic Surgery    2. Mastitis in male  - cephALEXin (KEFLEX) 500 MG capsule; Take 1 capsule (500 mg total) by mouth 2 (two) times daily for 10 days.  Dispense: 20 capsule; Refill: 0    3. Discoloration of skin  - US Venous Img Upper Uni Right(DVT)     Follow up:  Follow up in 3 months     Ivonne Andrew, NP 07/06/2023

## 2023-07-06 DIAGNOSIS — L03012 Cellulitis of left finger: Secondary | ICD-10-CM | POA: Insufficient documentation

## 2023-07-06 NOTE — Assessment & Plan Note (Signed)
-   Ambulatory referral to Orthopedic Surgery    2. Mastitis in male  - cephALEXin (KEFLEX) 500 MG capsule; Take 1 capsule (500 mg total) by mouth 2 (two) times daily for 10 days.  Dispense: 20 capsule; Refill: 0    3. Discoloration of skin  - US Venous Img Upper Uni Right(DVT)     Follow up:  Follow up in 3 months

## 2023-07-08 ENCOUNTER — Telehealth: Payer: Self-pay | Admitting: Gastroenterology

## 2023-07-08 NOTE — Telephone Encounter (Signed)
Patient called is requesting a call back from a nurse to go over prep instructions, he said he is confused and needs to prep properly for a double procedure tomorrow.

## 2023-07-08 NOTE — Telephone Encounter (Signed)
Pt has Plenvu at home, not Suprep.  Reviewed instructions with him, step by step.  All questions answered.

## 2023-07-10 ENCOUNTER — Ambulatory Visit (AMBULATORY_SURGERY_CENTER): Payer: Medicaid Other | Admitting: Gastroenterology

## 2023-07-10 ENCOUNTER — Encounter: Payer: Self-pay | Admitting: Gastroenterology

## 2023-07-10 ENCOUNTER — Other Ambulatory Visit (HOSPITAL_COMMUNITY): Payer: Self-pay

## 2023-07-10 VITALS — BP 98/49 | HR 83 | Temp 99.6°F | Resp 19 | Ht 69.0 in | Wt 186.0 lb

## 2023-07-10 DIAGNOSIS — K635 Polyp of colon: Secondary | ICD-10-CM

## 2023-07-10 DIAGNOSIS — D125 Benign neoplasm of sigmoid colon: Secondary | ICD-10-CM

## 2023-07-10 DIAGNOSIS — I8510 Secondary esophageal varices without bleeding: Secondary | ICD-10-CM

## 2023-07-10 DIAGNOSIS — K766 Portal hypertension: Secondary | ICD-10-CM | POA: Diagnosis not present

## 2023-07-10 DIAGNOSIS — D123 Benign neoplasm of transverse colon: Secondary | ICD-10-CM

## 2023-07-10 DIAGNOSIS — D120 Benign neoplasm of cecum: Secondary | ICD-10-CM

## 2023-07-10 DIAGNOSIS — D649 Anemia, unspecified: Secondary | ICD-10-CM

## 2023-07-10 DIAGNOSIS — K222 Esophageal obstruction: Secondary | ICD-10-CM | POA: Diagnosis not present

## 2023-07-10 DIAGNOSIS — D122 Benign neoplasm of ascending colon: Secondary | ICD-10-CM | POA: Diagnosis not present

## 2023-07-10 DIAGNOSIS — K3189 Other diseases of stomach and duodenum: Secondary | ICD-10-CM

## 2023-07-10 DIAGNOSIS — Z1211 Encounter for screening for malignant neoplasm of colon: Secondary | ICD-10-CM

## 2023-07-10 DIAGNOSIS — K7031 Alcoholic cirrhosis of liver with ascites: Secondary | ICD-10-CM

## 2023-07-10 MED ORDER — SODIUM CHLORIDE 0.9 % IV SOLN: 500.0000 mL | Freq: Once | INTRAVENOUS | Status: DC

## 2023-07-10 MED ORDER — NADOLOL 20 MG PO TABS
10.0000 mg | ORAL_TABLET | Freq: Every day | ORAL | 0 refills | Status: DC
Start: 2023-07-10 — End: 2024-03-24
  Filled 2023-07-10: qty 15, 30d supply, fill #0
  Filled 2023-10-23: qty 17, 34d supply, fill #0
  Filled 2024-03-02: qty 17, 34d supply, fill #1

## 2023-07-10 NOTE — Op Note (Signed)
[facility] Patient Name: Nathaniel Warren Procedure Date: 07/10/2023 2:01 PM MRN: [id number] Endoscopist: Corliss Parish , MD, [id number] Age: 57 Referring MD:  Date of Birth: 08/04/1966 Gender: Male Account #: [id number] Procedure:                Upper GI endoscopy Indications:              Cirrhosis rule out esophageal varices Medicines:                Monitored Anesthesia Care Procedure:                Pre-Anesthesia Assessment:                           - Prior to the procedure, a History and Physical                            was performed, and patient medications and                            allergies were reviewed. The patient's tolerance of                            previous anesthesia was also reviewed. The risks                            and benefits of the procedure and the sedation                            options and risks were discussed with the patient.                            All questions were answered, and informed consent                            was obtained. Prior Anticoagulants: The patient has                            taken no anticoagulant or antiplatelet agents. ASA                            Grade Assessment: III - A patient with severe                            systemic disease. After reviewing the risks and                            benefits, the patient was deemed in satisfactory                            condition to undergo the procedure.                           After obtaining informed consent, the endoscope was  passed under direct vision. Throughout the                            procedure, the patient's blood pressure, pulse, and                            oxygen saturations were monitored continuously. The                            Olympus scope [id number] was introduced through the                            mouth, and advanced to the second part of duodenum.                            The upper  GI endoscopy was accomplished without                            difficulty. The patient tolerated the procedure. Scope In: Scope Out: Findings:                 No gross lesions were noted in the proximal                            esophagus and in the mid esophagus.                           Grade I, grade II varices were found in the distal                            esophagus.                           A non-obstructing Schatzki ring was found at the                            gastroesophageal junction.                           A 1 cm hiatal hernia was present.                           Type 1 isolated gastric varices (IGV1, varices                            located in the fundus) were found in the gastric                            fundus. They were small in largest diameter.                           Moderate portal hypertensive gastropathy was found                            in the entire examined stomach.  Multiple dispersed small erosions with no bleeding                            and no stigmata of recent bleeding were found in                            the entire examined stomach. Biopsies were taken                            with a cold forceps for histology and Helicobacter                            pylori testing.                           No gross lesions were noted in the duodenal bulb,                            in the first portion of the duodenum and in the                            second portion of the duodenum. Complications:            No immediate complications. Estimated Blood Loss:     Estimated blood loss was minimal. Impression:               - No gross lesions in the proximal esophagus and in                            the mid esophagus.                           - Grade I and grade II esophageal varices noted                            distally.                           - Non-obstructing Schatzki ring.                            - 1 cm hiatal hernia.                           - Type 1 isolated gastric varices (IGV1, varices                            located in the fundus).                           - Portal hypertensive gastropathy.                           - Erosive gastropathy with no bleeding and no  stigmata of recent bleeding. Biopsied.                           - No gross lesions in the duodenal bulb, in the                            first portion of the duodenum and in the second                            portion of the duodenum. Recommendation:           - Proceed to scheduled colonoscopy.                           - Will start low-dose Nadolol 10 mg daily and                            titrate slowly to decrease risk of bleeding from                            varices in future. If does not tolerate titration                            then consider banding protocol.                           - Await pathology results.                           - Repeat EGD in 3-years if tolerate BB. Otherwise                            consider 1-year followup or sooner if we end up                            doing banding.                           - Referral to transplant center for further                            evaluation.                           - The findings and recommendations were discussed                            with the patient.                           - The findings and recommendations were discussed                            with the patient's family. Corliss Parish, MD 07/10/2023 3:06:11 PM

## 2023-07-10 NOTE — Progress Notes (Signed)
Called to room to assist during endoscopic procedure.  Patient ID and intended procedure confirmed with present staff. Received instructions for my participation in the procedure from the performing physician.  

## 2023-07-10 NOTE — Op Note (Signed)
[facility] Patient Name: Nathaniel Warren Procedure Date: 07/10/2023 2:01 PM MRN: [id number] Endoscopist: Corliss Parish , MD, [id number] Age: 57 Referring MD:  Date of Birth: July 03, 1966 Gender: Male Account #: [id number] Procedure:                Colonoscopy Indications:              Screening for colorectal malignant neoplasm Medicines:                Monitored Anesthesia Care Procedure:                Pre-Anesthesia Assessment:                           - Prior to the procedure, a History and Physical                            was performed, and patient medications and                            allergies were reviewed. The patient's tolerance of                            previous anesthesia was also reviewed. The risks                            and benefits of the procedure and the sedation                            options and risks were discussed with the patient.                            All questions were answered, and informed consent                            was obtained. Prior Anticoagulants: The patient has                            taken no anticoagulant or antiplatelet agents. ASA                            Grade Assessment: III - A patient with severe                            systemic disease. After reviewing the risks and                            benefits, the patient was deemed in satisfactory                            condition to undergo the procedure.                           After obtaining informed consent, the colonoscope  was passed under direct vision. Throughout the                            procedure, the patient's blood pressure, pulse, and                            oxygen saturations were monitored continuously. The                            CF [id number] #[id number] was introduced through the anus                            and advanced to the the cecum, identified by                            appendiceal  orifice and ileocecal valve. The                            colonoscopy was performed without difficulty. The                            patient tolerated the procedure. The quality of the                            bowel preparation was adequate. The ileocecal                            valve, appendiceal orifice, and rectum were                            photographed. Scope In: 2:29:53 PM Scope Out: 2:57:04 PM Scope Withdrawal Time: 0 hours 24 minutes 23 seconds  Total Procedure Duration: 0 hours 27 minutes 11 seconds  Findings:                 The digital rectal exam findings include                            hemorrhoids. Pertinent negatives include no                            palpable rectal lesions.                           The colon (entire examined portion) revealed                            moderately excessive looping.                           Seven sessile polyps were found in the sigmoid                            colon, splenic flexure, hepatic flexure, ascending  colon and cecum. The polyps were 2 to 10 mm in                            size. These polyps were removed with a cold snare.                            Resection and retrieval were complete.                           A 25 mm polyp was found in the sigmoid colon. The                            polyp was semi-sessile. Preparations were made for                            mucosal resection. Demarcation of the lesion was                            performed with high-definition white light and                            narrow band imaging to clearly identify the                            boundaries of the lesion. Saline was injected to                            raise the lesion. Snare mucosal resection was                            performed. Resection and retrieval were complete.                            Resected tissue margins were examined and clear of                             polyp tissue. To prevent bleeding after mucosal                            resection, two hemostatic clips were successfully                            placed (MR conditional). There was no bleeding                            during, or at the end, of the procedure. Area was                            tattooed with an injection of Spot (carbon black).                           Multiple small-mouthed diverticula were found in  the recto-sigmoid colon, sigmoid colon, descending                            colon and ascending colon.                           Normal mucosa was found in the entire colon.                           Non-bleeding non-thrombosed external and internal                            hemorrhoids were found during retroflexion, during                            perianal exam and during digital exam. The                            hemorrhoids were Grade III (internal hemorrhoids                            that prolapse but require manual reduction). Complications:            No immediate complications. Estimated Blood Loss:     Estimated blood loss was minimal. Impression:               - Hemorrhoids found on digital rectal exam.                           - There was significant looping of the colon.                           - Seven 2 to 10 mm polyps in the sigmoid colon, at                            the splenic flexure, at the hepatic flexure, in the                            ascending colon and in the cecum, removed with a                            cold snare. Resected and retrieved.                           - One 25 mm polyp in the sigmoid colon, removed                            with mucosal resection. Resected and retrieved.                            Clips (MR conditional) were placed. Tattooed.                           - Diverticulosis in the recto-sigmoid colon, in the  sigmoid colon, in the descending colon  and in the                            ascending colon.                           - Normal mucosa in the entire examined colon.                           - Non-bleeding non-thrombosed external and internal                            hemorrhoids. Recommendation:           - The patient will be observed post-procedure,                            until all discharge criteria are met.                           - Discharge patient to home.                           - Patient has a contact number available for                            emergencies. The signs and symptoms of potential                            delayed complications were discussed with the                            patient. Return to normal activities tomorrow.                            Written discharge instructions were provided to the                            patient.                           - High fiber diet.                           - Use FiberCon 1-2 tablets PO daily.                           - Continue present medications.                           - Await pathology results.                           - Repeat colonoscopy in 3 years for surveillance.                           - The findings and recommendations were  discussed                            with the patient.                           - The findings and recommendations were discussed                            with the patient's family. Corliss Parish, MD 07/10/2023 3:11:25 PM

## 2023-07-10 NOTE — Patient Instructions (Signed)
Start Nadalol 10 mg High fiber diet Use FiberCon1-2 tab PO daily YOU HAD AN ENDOSCOPIC PROCEDURE TODAY: Refer to the procedure report and other information in the discharge instructions given to you for any specific questions about what was found during the examination. If this information does not answer your questions, please call [place] office at [phone] to clarify.   YOU SHOULD EXPECT: Some feelings of bloating in the abdomen. Passage of more gas than usual. Walking can help get rid of the air that was put into your GI tract during the procedure and reduce the bloating. If you had a lower endoscopy (such as a colonoscopy or flexible sigmoidoscopy) you may notice spotting of blood in your stool or on the toilet paper. Some abdominal soreness may be present for a day or two, also.  DIET: Your first meal following the procedure should be a light meal and then it is ok to progress to your normal diet. A half-sandwich or bowl of soup is an example of a good first meal. Heavy or fried foods are harder to digest and may make you feel nauseous or bloated. Drink plenty of fluids but you should avoid alcoholic beverages for 24 hours. If you had a esophageal dilation, please see attached instructions for diet.    ACTIVITY: Your care partner should take you home directly after the procedure. You should plan to take it easy, moving slowly for the rest of the day. You can resume normal activity the day after the procedure however YOU SHOULD NOT DRIVE, use power tools, machinery or perform tasks that involve climbing or major physical exertion for 24 hours (because of the sedation medicines used during the test).   SYMPTOMS TO REPORT IMMEDIATELY: A gastroenterologist can be reached at any hour. Please call [phone]  for any of the following symptoms:  Following lower endoscopy (colonoscopy, flexible sigmoidoscopy) Excessive amounts of blood in the stool  Significant tenderness, worsening of abdominal  pains  Swelling of the abdomen that is new, acute  Fever of 100 or higher  Following upper endoscopy (EGD, EUS, ERCP, esophageal dilation) Vomiting of blood or coffee ground material  New, significant abdominal pain  New, significant chest pain or pain under the shoulder blades  Painful or persistently difficult swallowing  New shortness of breath  Black, tarry-looking or red, bloody stools  FOLLOW UP:  If any biopsies were taken you will be contacted by phone or by letter within the next 1-3 weeks. Call [phone]  if you have not heard about the biopsies in 3 weeks.  Please also call with any specific questions about appointments or follow up tests.

## 2023-07-10 NOTE — Progress Notes (Signed)
GASTROENTEROLOGY PROCEDURE H&P NOTE   Primary Care Physician: Ivonne Andrew, NP  HPI: Nathaniel Warren is a 57 y.o. male who presents for EGD/Colonoscopy for variceal screening and for colon cancer screening.  Past Medical History:  Diagnosis Date   Cirrhosis (HCC)    Depression    Drug overdose 10/20/2017   Drug overdose, intentional self-harm, initial encounter (HCC) 10/20/2017   Drug overdose, intentional, initial encounter (HCC) 10/20/2017   Hernia, abdominal    Liver failure (HCC)    Seizures (HCC)    Stroke (HCC)    Tobacco abuse    Past Surgical History:  Procedure Laterality Date   ANKLE SURGERY     ANKLE SURGERY Right    HERNIA REPAIR     INCISION AND DRAINAGE OF WOUND Left 02/03/2023   Procedure: IRRIGATION AND DEBRIDEMENT WOUND LEFT RING FINGER POSSIBLE AMPUTATION;  Surgeon: Gomez Cleverly, MD;  Location: [facility];  Service: Orthopedics;  Laterality: Left;   SPLENECTOMY     Current Outpatient Medications  Medication Sig Dispense Refill   cyclobenzaprine (FLEXERIL) 5 MG tablet Take 1 tablet (5 mg total) by mouth 3 (three) times daily as needed for muscle spasms. 15 tablet 0   furosemide (LASIX) 40 MG tablet Take 1 tablet (40 mg total) by mouth daily. 30 tablet 11   Na Sulfate-K Sulfate-Mg Sulf 17.5-3.13-1.6 GM/177ML SOLN Use as directed (Patient not taking: Reported on 06/09/2023) 354 mL 0   ondansetron (ZOFRAN) 4 MG tablet Take 1 tablet (4 mg total) by mouth every 8 (eight) hours as needed for nausea or vomiting. 12 tablet 11   oxyCODONE (ROXICODONE) 5 MG immediate release tablet Take 1 tablet (5 mg total) by mouth every 4 (four) hours as needed for severe pain. 10 tablet 0   PARoxetine (PAXIL) 20 MG tablet Take 1 tablet (20 mg total) by mouth daily. FURTHER REFILLS TO COME FROM PCP 30 tablet 0   QUEtiapine (SEROQUEL) 25 MG tablet Take 1 tablet (25 mg total) by mouth at bedtime. FURTHER REFILLS TO COME FROM PCP 30 tablet 0   spironolactone (ALDACTONE) 50 MG tablet  Take 1 tablet (50 mg total) by mouth in the morning. 30 tablet 11   No current facility-administered medications for this visit.    Current Outpatient Medications:    cyclobenzaprine (FLEXERIL) 5 MG tablet, Take 1 tablet (5 mg total) by mouth 3 (three) times daily as needed for muscle spasms., Disp: 15 tablet, Rfl: 0   furosemide (LASIX) 40 MG tablet, Take 1 tablet (40 mg total) by mouth daily., Disp: 30 tablet, Rfl: 11   Na Sulfate-K Sulfate-Mg Sulf 17.5-3.13-1.6 GM/177ML SOLN, Use as directed (Patient not taking: Reported on 06/09/2023), Disp: 354 mL, Rfl: 0   ondansetron (ZOFRAN) 4 MG tablet, Take 1 tablet (4 mg total) by mouth every 8 (eight) hours as needed for nausea or vomiting., Disp: 12 tablet, Rfl: 11   oxyCODONE (ROXICODONE) 5 MG immediate release tablet, Take 1 tablet (5 mg total) by mouth every 4 (four) hours as needed for severe pain., Disp: 10 tablet, Rfl: 0   PARoxetine (PAXIL) 20 MG tablet, Take 1 tablet (20 mg total) by mouth daily. FURTHER REFILLS TO COME FROM PCP, Disp: 30 tablet, Rfl: 0   QUEtiapine (SEROQUEL) 25 MG tablet, Take 1 tablet (25 mg total) by mouth at bedtime. FURTHER REFILLS TO COME FROM PCP, Disp: 30 tablet, Rfl: 0   spironolactone (ALDACTONE) 50 MG tablet, Take 1 tablet (50 mg total) by mouth in the morning.,  Disp: 30 tablet, Rfl: 11 No Known Allergies Family History  Problem Relation Age of Onset   COPD Mother    Diabetes Father    Stroke Maternal Grandmother    Alzheimer's disease Maternal Grandfather    Cancer Paternal Grandmother    Stomach cancer Paternal Grandfather    Hypertension Other    Colon cancer Neg Hx    Esophageal cancer Neg Hx    Colon polyps Neg Hx    Inflammatory bowel disease Neg Hx    Liver disease Neg Hx    Pancreatic cancer Neg Hx    Rectal cancer Neg Hx    Social History   Socioeconomic History   Marital status: Single    Spouse name: Not on file   Number of children: 2   Years of education: 14   Highest education level:  Not on file  Occupational History   Occupation: Corporate investment banker  Tobacco Use   Smoking status: Every Day    Current packs/day: 1.00    Average packs/day: 1 pack/day for 30.0 years (30.0 ttl pk-yrs)    Types: Cigarettes   Smokeless tobacco: Former  Building services engineer status: Never Used  Substance and Sexual Activity   Alcohol use: Not Currently   Drug use: Yes    Types: Marijuana   Sexual activity: Yes    Comment:  wife had hysterectomy  Other Topics Concern   Not on file  Social History Narrative   ** Merged History Encounter **    Left Handed. Can use right as well.    Unable to work   Lives with roommate and cousin   Drinks caffeine   One floor house   Social Determinants of Health   Financial Resource Strain: High Risk (07/15/2022)   Overall Financial Resource Strain (CARDIA)    Difficulty of Paying Living Expenses: Very hard  Food Insecurity: No Food Insecurity (03/04/2023)   Hunger Vital Sign    Worried About Running Out of Food in the Last Year: Never true    Ran Out of Food in the Last Year: Never true  Recent Concern: Food Insecurity - Food Insecurity Present (03/02/2023)   Hunger Vital Sign    Worried About Running Out of Food in the Last Year: Sometimes true    Ran Out of Food in the Last Year: Sometimes true  Transportation Needs: No Transportation Needs (03/04/2023)   PRAPARE - Administrator, Civil Service (Medical): No    Lack of Transportation (Non-Medical): No  Recent Concern: Transportation Needs - Unmet Transportation Needs (01/23/2023)   PRAPARE - Transportation    Lack of Transportation (Medical): Yes    Lack of Transportation (Non-Medical): Yes  Physical Activity: Inactive (07/15/2022)   Exercise Vital Sign    Days of Exercise per Week: 0 days    Minutes of Exercise per Session: 0 min  Stress: Stress Concern Present (07/15/2022)   [employer] of Occupational Health - Occupational Stress Questionnaire    Feeling of Stress : Very  much  Social Connections: Socially Isolated (07/15/2022)   Social Connection and Isolation Panel [NHANES]    Frequency of Communication with Friends and Family: More than three times a week    Frequency of Social Gatherings with Friends and Family: Once a week    Attends Religious Services: Never    Database administrator or Organizations: No    Attends Banker Meetings: Never    Marital Status: Divorced  Catering manager Violence:  Not At Risk (03/04/2023)   Humiliation, Afraid, Rape, and Kick questionnaire    Fear of Current or Ex-Partner: No    Emotionally Abused: No    Physically Abused: No    Sexually Abused: No    Physical Exam: Today's Vitals   07/10/23 1354  BP: 124/72  Pulse: 93  Temp: 99.6 F (37.6 C)  TempSrc: Other (Comment)  SpO2: 96%  Weight: 186 lb (84.4 kg)  Height: 5\' 9"  (1.753 m)   Body mass index is 27.47 kg/m. GEN: NAD EYE: Sclerae anicteric ENT: MMM CV: Non-tachycardic GI: Soft, NT/ND NEURO:  Alert & Oriented x 3  Lab Results: No results for input(s): "WBC", "HGB", "HCT", "PLT" in the last 72 hours. BMET No results for input(s): "NA", "K", "CL", "CO2", "GLUCOSE", "BUN", "CREATININE", "CALCIUM" in the last 72 hours. LFT No results for input(s): "PROT", "ALBUMIN", "AST", "ALT", "ALKPHOS", "BILITOT", "BILIDIR", "IBILI" in the last 72 hours. PT/INR No results for input(s): "LABPROT", "INR" in the last 72 hours.   Impression / Plan: This is a 57 y.o.male who presents for EGD/Colonoscopy for variceal screening and for colon cancer screening.  The risks and benefits of endoscopic evaluation/treatment were discussed with the patient and/or family; these include but are not limited to the risk of perforation, infection, bleeding, missed lesions, lack of diagnosis, severe illness requiring hospitalization, as well as anesthesia and sedation related illnesses.  The patient's history has been reviewed, patient examined, no change in status, and  deemed stable for procedure.  The patient and/or family is agreeable to proceed.    Corliss Parish, MD [facility] Office # [phone]

## 2023-07-10 NOTE — Progress Notes (Signed)
Sedate, gd SR, tolerated procedure well, VSS, report to RN 

## 2023-07-13 ENCOUNTER — Telehealth: Payer: Self-pay

## 2023-07-13 DIAGNOSIS — K7031 Alcoholic cirrhosis of liver with ascites: Secondary | ICD-10-CM

## 2023-07-13 NOTE — Telephone Encounter (Signed)
Referral made to [facility] order has been entered   Appt made to see GM on 8/21 at 1130 am   Left message on machine to call back

## 2023-07-13 NOTE — Telephone Encounter (Signed)
  Follow up Call-     07/10/2023    1:54 PM  Call back number  Post procedure Call Back phone  # [phone]  Permission to leave phone message Yes     Patient questions:  Do you have a fever, pain , or abdominal swelling? No. Pain Score  0 *  Have you tolerated food without any problems? Yes.    Have you been able to return to your normal activities? Yes.    Do you have any questions about your discharge instructions: Diet   No. Medications  No. Follow up visit  No.  Do you have questions or concerns about your Care? No.  Actions: * If pain score is 4 or above: No action needed, pain <4.

## 2023-07-13 NOTE — Telephone Encounter (Signed)
-----   Message from [facility] sent at 07/10/2023  5:18 PM EDT ----- Regarding: Transplant Evaluation , This patient needs to be seen in follow-up with one of the APP's or myself in 4 weeks. Okay to use a held slot or overbook slot with me if needed. Patient also needs a referral to [facility] and the [facility]ology team to be evaluated for consideration of liver transplantation in the setting of acute on chronic liver disease with rising MELD. In the next 2 weeks having come in for the following labs (CBC/CMP/INR/phosphatidylethanol level - PETH). Thanks. GM

## 2023-07-14 NOTE — Telephone Encounter (Signed)
The pt has been advised of the referral to [facility], the need for labs within the next 2 weeks- order has been entered.  He agrees to the appt on 8/21 with GM  No further questions at this time.    He will call back with any further concerns.

## 2023-07-15 ENCOUNTER — Encounter: Payer: Self-pay | Admitting: Gastroenterology

## 2023-07-17 ENCOUNTER — Other Ambulatory Visit: Payer: Self-pay | Admitting: Nurse Practitioner

## 2023-07-18 ENCOUNTER — Other Ambulatory Visit (HOSPITAL_COMMUNITY): Payer: Self-pay

## 2023-07-23 ENCOUNTER — Telehealth: Payer: Self-pay | Admitting: Gastroenterology

## 2023-07-23 NOTE — Telephone Encounter (Signed)
Patient missed hepatology clinic visit earlier in the week. Spoke with hepatology from [facility] who had updated me. Patient and I connected this afternoon there were transportation issues that led him to not come into the clinic visit. He is willing and will not miss another appointment. I have updated the transplant hepatology team. Patient will be coming in for labs next week as already future pended. I appreciate [facility] for being willing to see this patient, I will remain his gastroenterologist even with this referral.   Corliss Parish, MD [facility] Office # [phone]

## 2023-07-24 ENCOUNTER — Other Ambulatory Visit (INDEPENDENT_AMBULATORY_CARE_PROVIDER_SITE_OTHER): Payer: Medicaid Other

## 2023-07-24 DIAGNOSIS — K7031 Alcoholic cirrhosis of liver with ascites: Secondary | ICD-10-CM | POA: Diagnosis not present

## 2023-07-24 LAB — CBC WITH DIFFERENTIAL/PLATELET
Basophils Absolute: 0.1 10*3/uL (ref 0.0–0.1)
Basophils Relative: 1.3 % (ref 0.0–3.0)
Eosinophils Absolute: 0.5 10*3/uL (ref 0.0–0.7)
Eosinophils Relative: 4.4 % (ref 0.0–5.0)
HCT: 31.4 % — ABNORMAL LOW (ref 39.0–52.0)
Hemoglobin: 10.7 g/dL — ABNORMAL LOW (ref 13.0–17.0)
Lymphocytes Relative: 24.8 % (ref 12.0–46.0)
Lymphs Abs: 2.7 10*3/uL (ref 0.7–4.0)
MCHC: 34 g/dL (ref 30.0–36.0)
MCV: 108.4 fl — ABNORMAL HIGH (ref 78.0–100.0)
Monocytes Absolute: 1.4 10*3/uL — ABNORMAL HIGH (ref 0.1–1.0)
Monocytes Relative: 12.4 % — ABNORMAL HIGH (ref 3.0–12.0)
Neutro Abs: 6.3 10*3/uL (ref 1.4–7.7)
Neutrophils Relative %: 57.1 % (ref 43.0–77.0)
Platelets: 102 10*3/uL — ABNORMAL LOW (ref 150.0–400.0)
RBC: 2.9 Mil/uL — ABNORMAL LOW (ref 4.22–5.81)
RDW: 17.4 % — ABNORMAL HIGH (ref 11.5–15.5)
WBC: 11 10*3/uL — ABNORMAL HIGH (ref 4.0–10.5)

## 2023-07-24 LAB — COMPREHENSIVE METABOLIC PANEL
ALT: 21 U/L (ref 0–53)
AST: 54 U/L — ABNORMAL HIGH (ref 0–37)
Albumin: 2.5 g/dL — ABNORMAL LOW (ref 3.5–5.2)
Alkaline Phosphatase: 419 U/L — ABNORMAL HIGH (ref 39–117)
BUN: 10 mg/dL (ref 6–23)
CO2: 26 mEq/L (ref 19–32)
Calcium: 8.2 mg/dL — ABNORMAL LOW (ref 8.4–10.5)
Chloride: 99 mEq/L (ref 96–112)
Creatinine, Ser: 1.11 mg/dL (ref 0.40–1.50)
GFR: 74.78 mL/min (ref >60.00)
Glucose, Bld: 99 mg/dL (ref 70–99)
Potassium: 3.4 mEq/L — ABNORMAL LOW (ref 3.5–5.1)
Sodium: 130 mEq/L — ABNORMAL LOW (ref 135–145)
Total Bilirubin: 6.6 mg/dL — ABNORMAL HIGH (ref 0.2–1.2)
Total Protein: 6.6 g/dL (ref 6.0–8.3)

## 2023-07-24 LAB — PROTIME-INR
INR: 1.9 ratio — ABNORMAL HIGH (ref 0.8–1.0)
Prothrombin Time: 19.4 s — ABNORMAL HIGH (ref 9.6–13.1)

## 2023-07-27 ENCOUNTER — Other Ambulatory Visit: Payer: Self-pay

## 2023-07-27 DIAGNOSIS — R7989 Other specified abnormal findings of blood chemistry: Secondary | ICD-10-CM

## 2023-07-27 DIAGNOSIS — K7031 Alcoholic cirrhosis of liver with ascites: Secondary | ICD-10-CM

## 2023-07-29 ENCOUNTER — Ambulatory Visit: Payer: Medicaid Other | Admitting: Gastroenterology

## 2023-07-31 LAB — PHOSPHATIDYLETHANOL (PETH)
Phosphatidylethanol (PEth): NEGATIVE ng/mL
Phosphatidylethanol: NEGATIVE

## 2023-08-17 ENCOUNTER — Encounter: Payer: Self-pay | Admitting: Neurology

## 2023-08-17 ENCOUNTER — Ambulatory Visit: Payer: Medicaid Other | Admitting: Neurology

## 2023-08-28 ENCOUNTER — Telehealth: Payer: Self-pay

## 2023-08-28 ENCOUNTER — Other Ambulatory Visit: Payer: Self-pay

## 2023-08-28 NOTE — Telephone Encounter (Signed)
Pharmacy Patient Advocate Encounter  Received notification from [facility] that Prior Authorization for QUETIAPINE has been APPROVED from 08/27/2023 to 08/26/2024   PA #/Case ID/Reference #: [id number]

## 2023-09-28 ENCOUNTER — Ambulatory Visit: Payer: Self-pay | Admitting: Nurse Practitioner

## 2023-10-06 ENCOUNTER — Telehealth: Payer: Self-pay | Admitting: *Deleted

## 2023-10-06 NOTE — Telephone Encounter (Signed)
Left message for patient to call back  

## 2023-10-06 NOTE — Telephone Encounter (Signed)
-----   Message from Nurse Deno Etienne sent at 09/21/2023  9:57 AM EDT -----  ----- Message ----- From: Richardson Chiquito, RN Sent: 09/21/2023  12:00 AM EDT To: Richardson Chiquito, CMA  Pt needs u/s [facility] screen for alcoholic cirrhosis November 2024. See office note 05/27/23. Mansouraty patient.  Also needs labs (CBC/CMP/INR/AFP) drawn prior to this. Orders already placed in epic for around 10-09-23. Just need to let pt know he should have these done per dr Meridee Score lab note 05/27/23

## 2023-10-07 NOTE — Telephone Encounter (Signed)
Patient advised that Dr Meridee Score agrees, ER evaluation would be best. Patient states he will go to ER first thing tomorrow morning.

## 2023-10-07 NOTE — Telephone Encounter (Signed)
Agree with ED evaluation based on him feeling worse, rule out upper respiratory issues, may need repeat ascites tap and rule out SBP. GM

## 2023-10-07 NOTE — Telephone Encounter (Signed)
I contacted patient to advised that he is due for his routine HCC screen u/s and labs; needed to find some dates that would work for him to get these tests done.   Patient tells me that he was actually planning to go to the emergency room or contact our office because of increasing symptomatology since Friday. Patient tells me that his abdomen has become very distended/tight; no SOB; he has had cough as well as intermittent short lived left sided abdominal pain, "like a knife." Patient denies any vomiitng; does have some nausea;  +lower extremity edema despite diuretic therapy. When asked about jaundice, patient states "I'm yellow as a highlighter, my ex wife told me I looked horrible." +very dark urine. Feels he is becoming more forgetful.  Patient also expresses that he had "surgery on my finger" earlier this year (per hospital notes, I&D of left ring finger); says that infection is back and worse than before.  Several no shows/cancellations for GI/hepatology follow up over the last several months.  I advised patient that with constellation of symptoms, it would be best for him to be evaluated in the emergency room more expediently. Patient states that he cannot go today due to lack of transportation but will go tomorrow if Dr Meridee Score agrees.

## 2023-10-08 ENCOUNTER — Emergency Department (HOSPITAL_COMMUNITY): Payer: Medicaid Other

## 2023-10-08 ENCOUNTER — Encounter (HOSPITAL_COMMUNITY): Payer: Self-pay

## 2023-10-08 ENCOUNTER — Other Ambulatory Visit: Payer: Self-pay

## 2023-10-08 ENCOUNTER — Emergency Department (HOSPITAL_COMMUNITY)
Admission: EM | Admit: 2023-10-08 | Discharge: 2023-10-08 | Disposition: A | Discharge status: Home / Self Care | Payer: Medicaid Other | Attending: Emergency Medicine | Admitting: Emergency Medicine

## 2023-10-08 DIAGNOSIS — K7290 Hepatic failure, unspecified without coma: Secondary | ICD-10-CM | POA: Diagnosis not present

## 2023-10-08 DIAGNOSIS — K7460 Unspecified cirrhosis of liver: Secondary | ICD-10-CM | POA: Diagnosis not present

## 2023-10-08 DIAGNOSIS — K7031 Alcoholic cirrhosis of liver with ascites: Principal | ICD-10-CM | POA: Diagnosis present

## 2023-10-08 DIAGNOSIS — R0602 Shortness of breath: Secondary | ICD-10-CM | POA: Diagnosis present

## 2023-10-08 DIAGNOSIS — Z720 Tobacco use: Secondary | ICD-10-CM | POA: Diagnosis present

## 2023-10-08 DIAGNOSIS — D696 Thrombocytopenia, unspecified: Secondary | ICD-10-CM | POA: Diagnosis present

## 2023-10-08 LAB — CBC
HCT: 31.2 % — ABNORMAL LOW (ref 39.0–52.0)
Hemoglobin: 11.4 g/dL — ABNORMAL LOW (ref 13.0–17.0)
MCH: 36.5 pg — ABNORMAL HIGH (ref 26.0–34.0)
MCHC: 36.5 g/dL — ABNORMAL HIGH (ref 30.0–36.0)
MCV: 100 fL (ref 80.0–100.0)
Platelets: 86 10*3/uL — ABNORMAL LOW (ref 150–400)
RBC: 3.12 MIL/uL — ABNORMAL LOW (ref 4.22–5.81)
RDW: 18.3 % — ABNORMAL HIGH (ref 11.5–15.5)
WBC: 10 10*3/uL (ref 4.0–10.5)
nRBC: 0 % (ref 0.0–0.2)

## 2023-10-08 LAB — COMPREHENSIVE METABOLIC PANEL
ALT: 28 U/L (ref 0–44)
AST: 69 U/L — ABNORMAL HIGH (ref 15–41)
Albumin: 2.4 g/dL — ABNORMAL LOW (ref 3.5–5.0)
Alkaline Phosphatase: 261 U/L — ABNORMAL HIGH (ref 38–126)
Anion gap: 10 (ref 5–15)
BUN: 15 mg/dL (ref 6–20)
CO2: 22 mmol/L (ref 22–32)
Calcium: 9.1 mg/dL (ref 8.9–10.3)
Chloride: 100 mmol/L (ref 98–111)
Creatinine, Ser: 1.03 mg/dL (ref 0.61–1.24)
GFR, Estimated: >60 mL/min (ref >60)
Glucose, Bld: 108 mg/dL — ABNORMAL HIGH (ref 70–99)
Potassium: 3.5 mmol/L (ref 3.5–5.1)
Sodium: 132 mmol/L — ABNORMAL LOW (ref 135–145)
Total Bilirubin: 9.4 mg/dL — ABNORMAL HIGH (ref 0.3–1.2)
Total Protein: 7.5 g/dL (ref 6.5–8.1)

## 2023-10-08 LAB — URINALYSIS, ROUTINE W REFLEX MICROSCOPIC
Bacteria, UA: NONE SEEN
Bilirubin Urine: NEGATIVE
Glucose, UA: NEGATIVE mg/dL
Ketones, ur: NEGATIVE mg/dL
Leukocytes,Ua: NEGATIVE
Nitrite: NEGATIVE
Protein, ur: NEGATIVE mg/dL
Specific Gravity, Urine: 1.009 (ref 1.005–1.030)
pH: 5 (ref 5.0–8.0)

## 2023-10-08 LAB — BRAIN NATRIURETIC PEPTIDE: B Natriuretic Peptide: 92.2 pg/mL (ref 0.0–100.0)

## 2023-10-08 LAB — GLUCOSE, PLEURAL OR PERITONEAL FLUID: Glucose, Fluid: 108 mg/dL

## 2023-10-08 LAB — BODY FLUID CELL COUNT WITH DIFFERENTIAL
Lymphs, Fluid: 26 %
Monocyte-Macrophage-Serous Fluid: 67 % (ref 50–90)
Neutrophil Count, Fluid: 7 % (ref 0–25)
Total Nucleated Cell Count, Fluid: 62 uL (ref 0–1000)

## 2023-10-08 LAB — TROPONIN I (HIGH SENSITIVITY)
Troponin I (High Sensitivity): 8 ng/L (ref <18)
Troponin I (High Sensitivity): 11 ng/L (ref <18)

## 2023-10-08 LAB — PROTIME-INR
INR: 1.8 — ABNORMAL HIGH (ref 0.8–1.2)
Prothrombin Time: 21.2 s — ABNORMAL HIGH (ref 11.4–15.2)

## 2023-10-08 LAB — LIPASE, BLOOD: Lipase: 74 U/L — ABNORMAL HIGH (ref 11–51)

## 2023-10-08 LAB — GRAM STAIN

## 2023-10-08 LAB — PROTEIN, PLEURAL OR PERITONEAL FLUID: Total protein, fluid: <3 g/dL

## 2023-10-08 LAB — LACTATE DEHYDROGENASE, PLEURAL OR PERITONEAL FLUID: LD, Fluid: 28 U/L — ABNORMAL HIGH (ref 3–23)

## 2023-10-08 LAB — PHOSPHORUS: Phosphorus: 3.3 mg/dL (ref 2.5–4.6)

## 2023-10-08 LAB — MAGNESIUM: Magnesium: 1.8 mg/dL (ref 1.7–2.4)

## 2023-10-08 MED ORDER — NICOTINE 21 MG/24HR TD PT24: 21.0000 mg | MEDICATED_PATCH | TRANSDERMAL | Status: DC

## 2023-10-08 MED ORDER — LIDOCAINE HCL 1 % IJ SOLN
INTRAMUSCULAR | Status: AC
  Filled 2023-10-08: qty 20

## 2023-10-08 MED ORDER — POTASSIUM CHLORIDE CRYS ER 20 MEQ PO TBCR
20.0000 meq | EXTENDED_RELEASE_TABLET | Freq: Once | ORAL | Status: AC
  Administered 2023-10-08: 20 meq via ORAL
  Filled 2023-10-08: qty 1

## 2023-10-08 MED ORDER — NICOTINE 21 MG/24HR TD PT24: 21.0000 mg | MEDICATED_PATCH | Freq: Every day | TRANSDERMAL | Status: DC | PRN

## 2023-10-08 NOTE — Procedures (Signed)
PROCEDURE SUMMARY:  Successful ultrasound guided paracentesis from the right lower quadrant.  Yielded 2 L of clear yellow fluid.  No immediate complications.  The patient tolerated the procedure well.   Specimen sent for labs.  EBL < 2 mL  If the patient eventually requires >/=2 paracenteses in a 30 day period, screening evaluation by the [facility] will be assessed.  Alwyn Ren, [place] [phone] 10/08/2023, 2:23 PM

## 2023-10-08 NOTE — ED Triage Notes (Addendum)
Patient stated he was sent from his GI doctor. He said his abdomen has been swollen and he has liver cirrhosis. He said he usually gets his abdomen drained of fluid. Feels like his skin is more yellow than normal. Short of breath at rest.

## 2023-10-08 NOTE — Discharge Instructions (Signed)
1.  Call Dr. Elesa Hacker office tomorrow to get your follow-up scheduled to soon as possible. 2.  Return to the emergency department if you develop a fever, vomiting, suddenly worsening symptoms or other concerning changes.

## 2023-10-08 NOTE — ED Provider Triage Note (Signed)
Emergency Medicine Provider Triage Evaluation Note  Nathaniel Warren , a 57 y.o. male  was evaluated in triage.  Pt complains of worsening jaundice and increasing abdominal distention as well as shortness of breath.  Patient with underlying history of alcoholic cirrhosis, over the past 3 weeks has had increasing fluid accumulation on his abdomen.  He denies abdominal pain but reports it has been feeling increasingly tight and he is now feeling short of breath and like it is difficult to take a deep breath.  He had to sit down in the shower today because he kept getting out of breath.  Denies chest pain.  Worsening jaundice, called GI doctor yesterday and was encouraged to come into the ED.  Review of Systems  Positive: Jaundice, Abdominal distention, shortness of breath Negative: Vomiting, hematemesis, blood in stool, chest pain  Physical Exam  BP 93/65   Pulse 84   Temp 98.3 F (36.8 C) (Oral)   Resp 16   Ht 5\' 9"  (1.753 m)   Wt 80.3 kg   SpO2 96%   BMI 26.14 kg/m  Gen:   Awake, chronically ill-appearing, scleral icterus and diffuse jaundice noted Resp:  Normal effort, diminished breath sounds in lower lung fields MSK:   Moves extremities without difficulty  Other:  Nontender distended abdomen  Medical Decision Making  Medically screening exam initiated at 9:29 AM.  Appropriate orders placed.  Nathaniel Warren was informed that the remainder of the evaluation will be completed by another provider, this initial triage assessment does not replace that evaluation, and the importance of remaining in the ED until their evaluation is complete.  Labs and evaluation initiated, concerning for worsening sore cyst and ascites.   Nathaniel Warren, [place] 10/08/23 [phone]

## 2023-10-08 NOTE — Consult Note (Signed)
Initial Consultation Note   Patient: Nathaniel Warren LKG:[id number] DOB: March 19, 1966 PCP: Ivonne Andrew, NP DOA: 10/08/2023 DOS: the patient was seen and examined on 10/08/2023 Primary service: Linwood Dibbles, MD  Referring physician: Iantha Fallen, MD. Reason for consult: Decompensated cirrhosis.  Assessment/Plan: Principal Problem:   Decompensation of cirrhosis of liver (HCC)   Ascites due to alcoholic cirrhosis (HCC) Feeling better after paracentesis. May go home if PMN are less than 250 according to GI.  Active Problems:   Hyperbilirubinemia Secondary to liver cirrhosis.    Thrombocytopenia (HCC) Secondary to cirrhosis.   Monitor platelet count.    Tobacco abuse Nicotine replacement therapy ordered. Tobacco cessation advised.   TRH will sign off at present, please call us again when needed.  HPI: Nathaniel Warren is a 57 y.o. male with past medical history of tobacco abuse, depression, anxiety, history of DVT, history of prior alcohol abuse, alcoholic liver cirrhosis who was referred to the emergency department by his gastroenterologist due to decompensated liver cirrhosis.  He has been taking his diuretics like usual.  He has not missed any doses.  Denied any dietary indiscretions with sodium more fluid intake.  He has been feeling more dyspneic and stated he thinks he is more jaundiced than his baseline. He denied fever, chills, rhinorrhea, sore throat, wheezing or hemoptysis.  No chest pain, palpitations, diaphoresis, PND, orthopnea or pitting edema of the lower extremities.  No abdominal pain, nausea, emesis, diarrhea, constipation, melena or hematochezia.  No flank pain, dysuria, frequency or hematuria.  No polyuria, polydipsia, polyphagia or blurred vision.   Lab work: His urinalysis was amber with small hemoglobin, but otherwise negative.  CBC showed white count 10.0, hemoglobin 11.4 g deciliter platelets 86.  PT 21.2, INR 1.8.  Lipase was 74 units/L.  Troponin x 2 and BNP were  normal.  CMP showed a sodium 132 mmol/L with the rest of the electrolytes and renal function within normal limits.  Glucose 108 and total bilirubin 9.4 mg deciliter.  Total protein 7.5 and albumin 2.4 g/dL.  AST 69, ALT 28 alkaline phosphatase 261 units/L.  Imaging: 2 view chest radiograph showing small left pleural effusion but otherwise no acute abnormality.  IR US guided paracentesis was successful yielding about 2 L of peritoneal fluid.  ED course: Initial vital signs were temperature 98.3 F, pulse 84 g ration 16, BP 93/65 mmHg O2 sat 96% on room air.  The patient received 20 mEq of KCl while in ED.  Review of Systems: As mentioned in the history of present illness. All other systems reviewed and are negative. Past Medical History:  Diagnosis Date   Cirrhosis (HCC)    Depression    Drug overdose 10/20/2017   Drug overdose, intentional self-harm, initial encounter (HCC) 10/20/2017   Drug overdose, intentional, initial encounter (HCC) 10/20/2017   Hernia, abdominal    Liver failure (HCC)    Seizures (HCC)    Stroke (HCC)    Tobacco abuse    Past Surgical History:  Procedure Laterality Date   ANKLE SURGERY     ANKLE SURGERY Right    HERNIA REPAIR     INCISION AND DRAINAGE OF WOUND Left 02/03/2023   Procedure: IRRIGATION AND DEBRIDEMENT WOUND LEFT RING FINGER POSSIBLE AMPUTATION;  Surgeon: Gomez Cleverly, MD;  Location: [facility];  Service: Orthopedics;  Laterality: Left;   SPLENECTOMY     Social History:  reports that he has been smoking cigarettes. He has a 30 pack-year smoking history. He has quit using  smokeless tobacco. He reports that he does not currently use alcohol. He reports current drug use. Drug: Marijuana.  No Known Allergies  Family History  Problem Relation Age of Onset   COPD Mother    Diabetes Father    Stroke Maternal Grandmother    Alzheimer's disease Maternal Grandfather    Cancer Paternal Grandmother    Stomach cancer Paternal Grandfather    Hypertension  Other    Colon cancer Neg Hx    Esophageal cancer Neg Hx    Colon polyps Neg Hx    Inflammatory bowel disease Neg Hx    Liver disease Neg Hx    Pancreatic cancer Neg Hx    Rectal cancer Neg Hx     Prior to Admission medications   Medication Sig Start Date End Date Taking? Authorizing Provider  cyclobenzaprine (FLEXERIL) 5 MG tablet Take 1 tablet (5 mg total) by mouth 3 (three) times daily as needed for muscle spasms. 06/26/23   Ivonne Andrew, NP  furosemide (LASIX) 40 MG tablet Take 1 tablet (40 mg total) by mouth daily. 05/27/23   Mansouraty, Netty Starring., MD  Na Sulfate-K Sulfate-Mg Sulf 17.5-3.13-1.6 GM/177ML SOLN Use as directed 05/27/23   Mansouraty, Netty Starring., MD  nadolol (CORGARD) 20 MG tablet Take 0.5 tablets (10 mg total) by mouth daily. 07/10/23   Mansouraty, Netty Starring., MD  ondansetron (ZOFRAN) 4 MG tablet Take 1 tablet (4 mg total) by mouth every 8 (eight) hours as needed for nausea or vomiting. 05/27/23   Mansouraty, Netty Starring., MD  oxyCODONE (ROXICODONE) 5 MG immediate release tablet Take 1 tablet (5 mg total) by mouth every 4 (four) hours as needed for severe pain. 06/26/23   Ivonne Andrew, NP  PARoxetine (PAXIL) 20 MG tablet Take 1 tablet (20 mg total) by mouth daily. FURTHER REFILLS TO COME FROM PCP 05/27/23 10/08/23  Mansouraty, Netty Starring., MD  QUEtiapine (SEROQUEL) 25 MG tablet TAKE 1 TABLET BY MOUTH AT BEDTIME NEED  OFFICE  VISIT 07/20/23   Ivonne Andrew, NP  spironolactone (ALDACTONE) 50 MG tablet Take 1 tablet (50 mg total) by mouth in the morning. 05/27/23   Mansouraty, Netty Starring., MD    Physical Exam: Vitals:   10/08/23 1328 10/08/23 1329 10/08/23 1335 10/08/23 1442  BP: 107/60 (!) 104/57 (!) 103/58 (!) 105/49  Pulse:    67  Resp:    16  Temp:    97.9 F (36.6 C)  TempSrc:    Oral  SpO2:    100%  Weight:      Height:       Physical Exam Vitals and nursing note reviewed.  Constitutional:      General: He is awake. He is not in acute distress.     Appearance: He is ill-appearing.  HENT:     Head: Normocephalic.     Nose: No rhinorrhea.     Mouth/Throat:     Mouth: Mucous membranes are moist.  Eyes:     General: No scleral icterus.    Pupils: Pupils are equal, round, and reactive to light.  Neck:     Vascular: No JVD.  Cardiovascular:     Rate and Rhythm: Normal rate and regular rhythm.     Heart sounds: S1 normal and S2 normal.  Pulmonary:     Effort: Pulmonary effort is normal.     Breath sounds: Normal breath sounds. No wheezing, rhonchi or rales.  Abdominal:     General: Bowel sounds are normal. There is  no distension.     Palpations: Abdomen is soft.     Tenderness: There is abdominal tenderness in the right upper quadrant. There is left CVA tenderness. There is no right CVA tenderness, guarding or rebound.  Musculoskeletal:     Cervical back: Neck supple.     Right lower leg: 1+ Pitting Edema present.     Left lower leg: 1+ Pitting Edema present.  Skin:    General: Skin is warm and dry.     Coloration: Skin is jaundiced.  Neurological:     Mental Status: He is alert and oriented to person, place, and time.  Psychiatric:        Mood and Affect: Mood normal.        Behavior: Behavior is cooperative.     Data Reviewed:   There are no new results to review at this time.   Family Communication:  Primary team communication:  Thank you very much for involving [place] in the care of your patient.  Author: Bobette Mo, MD 10/08/2023 4:48 PM  For on call review [email or web address].   This document was prepared using Dragon voice recognition software and may contain some unintended transcription errors.

## 2023-10-08 NOTE — ED Provider Notes (Signed)
Paracentesis done, f/u results, if not SBP, may be candidate for D/C home. Physical Exam  BP (!) 105/49   Pulse 67   Temp 97.9 F (36.6 C) (Oral)   Resp 16   Ht 5\' 9"  (1.753 m)   Wt 80.3 kg   SpO2 100%   BMI 26.14 kg/m   Physical Exam  Procedures  Procedures  ED Course / MDM   Clinical Course as of 10/08/23 1718  Thu Oct 08, 2023  1157 Chest x-ray shows small left pleural effusion otherwise no acute abnormality [JK]  1209 Labs notable for elevation of bilirubin.  Up to 9.4.  CBC shows stable hemoglobin.  No leukocytosis.  Platelets decreased [JK]  1209 Thrombocytopenia noted on previous labs.  Lipase increased at 74, similar to previous values [JK]  1509 Case discussed with [facility], Nat Math.  Recommend admission to hospital.  Admit considering increasing bili and concerns for SBP [JK]    Clinical Course User Index [JK] Linwood Dibbles, MD   Medical Decision Making Amount and/or Complexity of Data Reviewed Labs: ordered. Radiology: ordered.  Risk Decision regarding hospitalization.   Peritoneal fluid 62 WBCs 7% neutrophils  Patient is alert.  Mental status is clear.  At this time peritoneal fluid specimen does not suggest SBP.  Patient is established with the GI practice with Dr. Meridee Score.  Patient is to call their office tomorrow to get follow-up established as soon as possible.       Arby Barrette, MD 10/08/23 [phone]

## 2023-10-08 NOTE — ED Provider Notes (Addendum)
[facility] AT [facility] Provider Note   CSN: [id number] Arrival date & time: 10/08/23  6578     History  Chief Complaint  Patient presents with   Abdominal Swelling    Nathaniel Warren is a 57 y.o. male.  HPI   Patient presents to the ED for evaluation of shortness of breath abdominal swelling.  Patient has a history of cirrhosis.  Patient has noticed that he is getting more short of breath.  It gets worse when he is lying flat.  He also feels like his abdomen is more distended.  He is more jaundiced than usual.  He denies any fevers or chills.  No vomiting or diarrhea.  Patient called his GI doctor instructed to come to the ED  Home Medications Prior to Admission medications   Medication Sig Start Date End Date Taking? Authorizing Provider  cyclobenzaprine (FLEXERIL) 5 MG tablet Take 1 tablet (5 mg total) by mouth 3 (three) times daily as needed for muscle spasms. 06/26/23   Ivonne Andrew, NP  furosemide (LASIX) 40 MG tablet Take 1 tablet (40 mg total) by mouth daily. 05/27/23   Mansouraty, Netty Starring., MD  Na Sulfate-K Sulfate-Mg Sulf 17.5-3.13-1.6 GM/177ML SOLN Use as directed 05/27/23   Mansouraty, Netty Starring., MD  nadolol (CORGARD) 20 MG tablet Take 0.5 tablets (10 mg total) by mouth daily. 07/10/23   Mansouraty, Netty Starring., MD  ondansetron (ZOFRAN) 4 MG tablet Take 1 tablet (4 mg total) by mouth every 8 (eight) hours as needed for nausea or vomiting. 05/27/23   Mansouraty, Netty Starring., MD  oxyCODONE (ROXICODONE) 5 MG immediate release tablet Take 1 tablet (5 mg total) by mouth every 4 (four) hours as needed for severe pain. 06/26/23   Ivonne Andrew, NP  PARoxetine (PAXIL) 20 MG tablet Take 1 tablet (20 mg total) by mouth daily. FURTHER REFILLS TO COME FROM PCP 05/27/23 10/08/23  Mansouraty, Netty Starring., MD  QUEtiapine (SEROQUEL) 25 MG tablet TAKE 1 TABLET BY MOUTH AT BEDTIME NEED  OFFICE  VISIT 07/20/23   Ivonne Andrew, NP  spironolactone  (ALDACTONE) 50 MG tablet Take 1 tablet (50 mg total) by mouth in the morning. 05/27/23   Mansouraty, Netty Starring., MD      Allergies    Patient has no known allergies.    Review of Systems   Review of Systems  Musculoskeletal:        Fingernail abnormality left ring finger (hypertrophic).  Patient states it has been there since his prior surgery, chronic ankle pain right leg since an old injury, no swelling, no fever    Physical Exam Updated Vital Signs BP (!) 105/49   Pulse 67   Temp 97.9 F (36.6 C) (Oral)   Resp 16   Ht 1.753 m (5\' 9" )   Wt 80.3 kg   SpO2 100%   BMI 26.14 kg/m  Physical Exam Vitals and nursing note reviewed.  Constitutional:      Appearance: He is well-developed. He is ill-appearing.  HENT:     Head: Normocephalic and atraumatic.     Right Ear: External ear normal.     Left Ear: External ear normal.  Eyes:     General: No scleral icterus.       Right eye: No discharge.        Left eye: No discharge.     Conjunctiva/sclera: Conjunctivae normal.  Neck:     Trachea: No tracheal deviation.  Cardiovascular:  Rate and Rhythm: Normal rate and regular rhythm.  Pulmonary:     Effort: Pulmonary effort is normal. No respiratory distress.     Breath sounds: Normal breath sounds. No stridor. No wheezing or rales.  Abdominal:     General: Bowel sounds are normal. There is distension.     Palpations: Abdomen is soft.     Tenderness: There is no abdominal tenderness. There is no guarding or rebound.     Comments: Abdomen is soft  Musculoskeletal:        General: No tenderness or deformity.     Cervical back: Neck supple.     Right lower leg: No edema.     Left lower leg: No edema.  Skin:    General: Skin is warm and dry.     Coloration: Skin is jaundiced.     Findings: No rash.  Neurological:     General: No focal deficit present.     Mental Status: He is alert.     Cranial Nerves: No cranial nerve deficit, dysarthria or facial asymmetry.      Sensory: No sensory deficit.     Motor: No abnormal muscle tone or seizure activity.     Coordination: Coordination normal.  Psychiatric:        Mood and Affect: Mood normal.     ED Results / Procedures / Treatments   Labs (all labs ordered are listed, but only abnormal results are displayed) Labs Reviewed  LIPASE, BLOOD - Abnormal; Notable for the following components:      Result Value   Lipase 74 (*)    All other components within normal limits  COMPREHENSIVE METABOLIC PANEL - Abnormal; Notable for the following components:   Sodium 132 (*)    Glucose, Bld 108 (*)    Albumin 2.4 (*)    AST 69 (*)    Alkaline Phosphatase 261 (*)    Total Bilirubin 9.4 (*)    All other components within normal limits  CBC - Abnormal; Notable for the following components:   RBC 3.12 (*)    Hemoglobin 11.4 (*)    HCT 31.2 (*)    MCH 36.5 (*)    MCHC 36.5 (*)    RDW 18.3 (*)    Platelets 86 (*)    All other components within normal limits  URINALYSIS, ROUTINE W REFLEX MICROSCOPIC - Abnormal; Notable for the following components:   Color, Urine AMBER (*)    Hgb urine dipstick SMALL (*)    All other components within normal limits  PROTIME-INR - Abnormal; Notable for the following components:   Prothrombin Time 21.2 (*)    INR 1.8 (*)    All other components within normal limits  GRAM STAIN  AEROBIC/ANAEROBIC CULTURE W GRAM STAIN (SURGICAL/DEEP WOUND)  AEROBIC CULTURE W GRAM STAIN (SUPERFICIAL SPECIMEN)  BODY FLUID CULTURE W GRAM STAIN  BRAIN NATRIURETIC PEPTIDE  LACTATE DEHYDROGENASE, PLEURAL OR PERITONEAL FLUID  BODY FLUID CELL COUNT WITH DIFFERENTIAL  PROTEIN, PLEURAL OR PERITONEAL FLUID  GLUCOSE, PLEURAL OR PERITONEAL FLUID  MAGNESIUM  PHOSPHORUS  TROPONIN I (HIGH SENSITIVITY)  TROPONIN I (HIGH SENSITIVITY)    EKG EKG Interpretation Date/Time:  Thursday October 08 2023 08:49:12 EDT Ventricular Rate:  85 PR Interval:  145 QRS Duration:  99 QT Interval:  407 QTC  Calculation: 484 R Axis:   -54  Text Interpretation: Sinus rhythm Left anterior fascicular block Abnormal R-wave progression, early transition Abnormal T, consider ischemia, lateral leads No significant change since last tracing  Confirmed by Gwyneth Sprout ([id number]) on 10/08/2023 11:38:09 AM  Radiology US Paracentesis  Result Date: 10/08/2023 INDICATION: Patient with a history of alcoholic cirrhosis presents today with ascites. Interventional radiology asked to perform a diagnostic and therapeutic paracentesis. EXAM: ULTRASOUND GUIDED PARACENTESIS MEDICATIONS: 1% lidocaine 10 mL COMPLICATIONS: None immediate. PROCEDURE: Informed written consent was obtained from the patient after a discussion of the risks, benefits and alternatives to treatment. A timeout was performed prior to the initiation of the procedure. Initial ultrasound scanning demonstrates a large amount of ascites within the right lower abdominal quadrant. The right lower abdomen was prepped and draped in the usual sterile fashion. 1% lidocaine was used for local anesthesia. Following this, a 19 gauge, 7-cm, Yueh catheter was introduced. An ultrasound image was saved for documentation purposes. The paracentesis was performed. The catheter was removed and a dressing was applied. The patient tolerated the procedure well without immediate post procedural complication. Patient received post-procedure intravenous albumin; see nursing notes for details. FINDINGS: A total of approximately 2 L of clear yellow fluid was removed. Samples were sent to the laboratory as requested by the clinical team. IMPRESSION: Successful ultrasound-guided paracentesis yielding 2 liters of peritoneal fluid. Procedure performed by Alwyn Ren NP PLAN: If the patient eventually requires >/=2 paracenteses in a 30 day period, candidacy for formal evaluation by the [facility] Interventional Radiology [facility] will be assessed. Electronically Signed   By:  Simonne Come M.D.   On: 10/08/2023 14:52   DG Chest 2 View  Result Date: 10/08/2023 CLINICAL DATA:  Chest pain. EXAM: CHEST - 2 VIEW COMPARISON:  06/08/2023. FINDINGS: Bilateral lung fields are clear. Right costophrenic angle is clear. There is blunting of left posterior costophrenic angle, suggesting small left pleural effusion. Normal cardio-mediastinal silhouette. No acute osseous abnormalities. The soft tissues are within normal limits. IMPRESSION: *Small left pleural effusion. Otherwise no acute cardiopulmonary abnormality. Electronically Signed   By: Jules Schick M.D.   On: 10/08/2023 10:21    Procedures Procedures    Medications Ordered in ED Medications  potassium chloride SA (KLOR-CON M) CR tablet 20 mEq (has no administration in time range)    ED Course/ Medical Decision Making/ A&P Clinical Course as of 10/08/23 1628  Thu Oct 08, 2023  1157 Chest x-ray shows small left pleural effusion otherwise no acute abnormality [JK]  1209 Labs notable for elevation of bilirubin.  Up to 9.4.  CBC shows stable hemoglobin.  No leukocytosis.  Platelets decreased [JK]  1209 Thrombocytopenia noted on previous labs.  Lipase increased at 74, similar to previous values [JK]  1509 Case discussed with [facility], Nat Math.  Recommend admission to hospital.  Admit considering increasing bili and concerns for SBP [JK]    Clinical Course User Index [JK] Linwood Dibbles, MD                                 Medical Decision Making Amount and/or Complexity of Data Reviewed Labs: ordered. Radiology: ordered.  Risk Decision regarding hospitalization.   Patient presented to the ED with complaints of abdominal swelling increasing jaundice.  Patient has a known history of cirrhosis.  Patient denies any recurrent alcohol use.  Patient's laboratory tests were notable for no leukocytosis.  Significantly low bilirubin at 9.4.  Patient underwent ultrasound-guided paracentesis by interventional  radiology.  Patient is feeling better.  Will wait on laboratory tests to assess for possible SBP.  Case discussed  with GI and the hospitalist service.  If no findings of SBP patient likely should be stable for outpatient management and follow-up with GI.  Will monitor in the ED and follow-up on paracentesis results.  Case turned over to Dr Broadus John at shift change   Final Clinical Impression(s) / ED Diagnoses Final diagnoses:  Alcoholic cirrhosis of liver with ascites [facility])    Rx / DC Orders ED Discharge Orders     None         Linwood Dibbles, MD 10/08/23 Eldridge Dace    Linwood Dibbles, MD 10/08/23 [phone]

## 2023-10-12 ENCOUNTER — Telehealth: Payer: Self-pay

## 2023-10-12 NOTE — Telephone Encounter (Signed)
Appt made to see Nathaniel Warren on 10/19/23 at 1030 am.   Left message on machine to call back

## 2023-10-12 NOTE — Telephone Encounter (Signed)
-----   Message from [facility] sent at 10/09/2023  4:59 PM EDT ----- Amado Nash, Thanks for letting me know. It is unfortunate that he missed the Hepatology clinic referral. He is in a difficult situation and his liver is not getting better. Followup in clinic with me or APP as available Alacia Rehmann (can use a 7-day hold in the next couple of weeks for APP if needed). Make sure he is taking Lasix 40 mg and Spironolactone 50 mg.  If he is not taking these then he should restart.  If he is taking these, then we will need to consider uptitration. When you speak with him Alexia Freestone, make sure he tries to reschedule with Annamarie Major for consultation. Thanks. GM ----- Message ----- From: Arnaldo Natal, NP Sent: 10/09/2023   8:22 AM EDT To: Lemar Lofty., MD  Dr. Meridee Score, your patient was seen in the ED 10/31, had a paracentesis and was sent home. Was not seen by our GI inpatient service. Dr. Leone Payor spoke with Dr. Lynelle Doctor. Not sure if Dr. Leone Payor sent you a message, but wanted you to know so you can determine what follow up you want patient to have in our office as he no showed at [facility]rium.  [employer]

## 2023-10-13 LAB — CULTURE, BODY FLUID W GRAM STAIN -BOTTLE: Culture: NO GROWTH

## 2023-10-13 NOTE — Telephone Encounter (Signed)
The pt has been advised of the information and states he will call and set up appt with [facility].  He will keep appt with Jill Side on 11/11  He confirms that he is taking lasix 40 mg and spironolactone 50 mg as directed.

## 2023-10-14 ENCOUNTER — Other Ambulatory Visit (HOSPITAL_COMMUNITY): Payer: Self-pay | Admitting: Physician Assistant

## 2023-10-14 ENCOUNTER — Other Ambulatory Visit (HOSPITAL_COMMUNITY): Payer: Self-pay

## 2023-10-14 DIAGNOSIS — F4310 Post-traumatic stress disorder, unspecified: Secondary | ICD-10-CM

## 2023-10-14 DIAGNOSIS — F332 Major depressive disorder, recurrent severe without psychotic features: Secondary | ICD-10-CM

## 2023-10-14 DIAGNOSIS — F411 Generalized anxiety disorder: Secondary | ICD-10-CM

## 2023-10-19 ENCOUNTER — Encounter: Payer: Self-pay | Admitting: Nurse Practitioner

## 2023-10-19 ENCOUNTER — Ambulatory Visit (INDEPENDENT_AMBULATORY_CARE_PROVIDER_SITE_OTHER): Payer: Medicaid Other | Admitting: Nurse Practitioner

## 2023-10-19 ENCOUNTER — Other Ambulatory Visit (INDEPENDENT_AMBULATORY_CARE_PROVIDER_SITE_OTHER): Payer: Medicaid Other

## 2023-10-19 ENCOUNTER — Other Ambulatory Visit (HOSPITAL_COMMUNITY): Payer: Self-pay

## 2023-10-19 VITALS — BP 110/58 | HR 86 | Ht 69.0 in | Wt 207.0 lb

## 2023-10-19 DIAGNOSIS — I8510 Secondary esophageal varices without bleeding: Secondary | ICD-10-CM

## 2023-10-19 DIAGNOSIS — K7030 Alcoholic cirrhosis of liver without ascites: Secondary | ICD-10-CM | POA: Diagnosis not present

## 2023-10-19 DIAGNOSIS — I8500 Esophageal varices without bleeding: Secondary | ICD-10-CM | POA: Diagnosis not present

## 2023-10-19 LAB — BASIC METABOLIC PANEL
BUN: 13 mg/dL (ref 6–23)
CO2: 23 meq/L (ref 19–32)
Calcium: 8.9 mg/dL (ref 8.4–10.5)
Chloride: 99 meq/L (ref 96–112)
Creatinine, Ser: 1.19 mg/dL (ref 0.40–1.50)
GFR: 68.68 mL/min (ref >60.00)
Glucose, Bld: 96 mg/dL (ref 70–99)
Potassium: 4.6 meq/L (ref 3.5–5.1)
Sodium: 132 meq/L — ABNORMAL LOW (ref 135–145)

## 2023-10-19 LAB — CBC WITH DIFFERENTIAL/PLATELET
Basophils Absolute: 0.1 10*3/uL (ref 0.0–0.1)
Basophils Relative: 0.9 % (ref 0.0–3.0)
Eosinophils Absolute: 0.3 10*3/uL (ref 0.0–0.7)
Eosinophils Relative: 2.6 % (ref 0.0–5.0)
HCT: 34.8 % — ABNORMAL LOW (ref 39.0–52.0)
Hemoglobin: 12.2 g/dL — ABNORMAL LOW (ref 13.0–17.0)
Lymphocytes Relative: 48.6 % — ABNORMAL HIGH (ref 12.0–46.0)
Lymphs Abs: 5.3 10*3/uL — ABNORMAL HIGH (ref 0.7–4.0)
MCHC: 35.2 g/dL (ref 30.0–36.0)
MCV: 106.8 fL — ABNORMAL HIGH (ref 78.0–100.0)
Monocytes Absolute: 0.8 10*3/uL (ref 0.1–1.0)
Monocytes Relative: 7.7 % (ref 3.0–12.0)
Neutro Abs: 4.4 10*3/uL (ref 1.4–7.7)
Neutrophils Relative %: 40.2 % — ABNORMAL LOW (ref 43.0–77.0)
Platelets: 106 10*3/uL — ABNORMAL LOW (ref 150.0–400.0)
RBC: 3.26 Mil/uL — ABNORMAL LOW (ref 4.22–5.81)
RDW: 17.3 % — ABNORMAL HIGH (ref 11.5–15.5)
WBC: 10.9 10*3/uL — ABNORMAL HIGH (ref 4.0–10.5)

## 2023-10-19 LAB — PROTIME-INR
INR: 1.7 {ratio} — ABNORMAL HIGH (ref 0.8–1.0)
Prothrombin Time: 17.8 s — ABNORMAL HIGH (ref 9.6–13.1)

## 2023-10-19 LAB — HEPATIC FUNCTION PANEL
ALT: 29 U/L (ref 0–53)
AST: 81 U/L — ABNORMAL HIGH (ref 0–37)
Albumin: 2.7 g/dL — ABNORMAL LOW (ref 3.5–5.2)
Alkaline Phosphatase: 418 U/L — ABNORMAL HIGH (ref 39–117)
Bilirubin, Direct: 3.3 mg/dL — ABNORMAL HIGH (ref 0.0–0.3)
Total Bilirubin: 7.5 mg/dL — ABNORMAL HIGH (ref 0.2–1.2)
Total Protein: 7.3 g/dL (ref 6.0–8.3)

## 2023-10-19 NOTE — Patient Instructions (Addendum)
Low sodium diet- do not add any salt to your meals  No processed foods.  Increase protein in your diet: lean meats.  Your provider has requested that you go to the basement level for lab work before leaving today. Press "B" on the elevator. The lab is located at the first door on the left as you exit the elevator.  Further instructions will be provided once lab results reviewed.  Go to the ER if you have any chest pain or shortness of breath.  Due to recent changes in healthcare laws, you may see the results of your imaging and laboratory studies on MyChart before your provider has had a chance to review them.  We understand that in some cases there may be results that are confusing or concerning to you. Not all laboratory results come back in the same time frame and the provider may be waiting for multiple results in order to interpret others.  Please give [place] 48 hours in order for your provider to thoroughly review all the results before contacting the office for clarification of your results.   Thank you for trusting me with your gastrointestinal care!   Alcide Evener, CRNP

## 2023-10-19 NOTE — Progress Notes (Signed)
10/19/2023 Nathaniel Warren [id number] 10-18-1966   Chief Complaint: Abdominal and leg swelling, ED follow up  History of Present Illness: Nathaniel Warren is a 57 year old male with a past medical history of anxiety, bipolar disorder, prior seizures, CVA and alcohol associated decompensated cirrhosis with ascites, grade I/II esophageal varices, type I isolated gastric varices and portal hypertensive gastropathy. MELD 3.0: 25.  He is known by Dr. Meridee Score. Previously referred to Annamarie Major NP at [facility], patient no showed for appointment 07/2023 and initial consult was recently rescheduled 11/26/2023.  He presented to the ED 10/08/2023 with SOB and increased abdominal and swelling. Labs in the ED showed a WBC count of 10.0.  Hemoglobin 11.4.  Hematocrit 31.2.  Platelet 86.  Sodium 132.  Potassium 3.5.  BUN 15.  Creatinine 1.03.  Total bili 9.4.  Alk phos 261.  AST 69.  ALT 28.  Albumin 2.4.  Lipase 74.  BNP 92.2.  INR 1.8. S/P paracentesis, 2L peritoneal fluid removed without evidence of SBP.  Peritoneal fluid cultures were negative.  Chest x-ray showed small left pleural effusion otherwise no acute abnormality.  No hepatic encephalopathy. His clinical status was stable and he was discharged home with instructions to follow-up in our office as scheduled today.  He presents to our office today accompanied by his daughter. He has occasional nausea for which he takes Ondansetron with symptom relief approximately 3 to 4 days weekly. No vomiting. No abdominal pain. Abdominal swelling is mild, not as bad as when he went to the ED as noted above. He continues to have lower extremity edema which he is concerned about.  He is adherent with taking Furosemide 40 mg daily and Spironolactone 50 mg daily. He is not adding any salt to his diet. He eats a lot of processed/frozen foods. He denies ever starting Nadolol as prescribed post EGD 07/2023 which showed grade I/II EV, IGV1 and portal hypertensive  gastropathy. Colonoscopy 07/2023 showed 7 hyperplastic/serrated polyps removed from the colon, one 25 mm TA removed by mucosal resection and diverticulosis. Repeat EGD and colonoscopy recommended in 3 years. He noted having gynecomastia which started a few months ago. He had chest pain 3 nights ago which lasted for 30 minutes then abated without recurrence. He sometimes feels foggy headed without overt confusion, this is also confirmed by his daughter. He remains abstinent from alcohol since 12/2021.  He continues to smoke cigarettes 1 pack/day x 40 years.  He smokes marijuana 3 times weekly.  He stated he is always a little short of breath. He is using a walker due to having a right leg/knee/ankle injury. He last took Oxycodone for right leg/foot pain 3 weeks ago.       Latest Ref Rng & Units 10/08/2023    8:49 AM 07/24/2023   12:11 PM 06/08/2023   12:53 PM  CBC  WBC 4.0 - 10.5 K/uL 10.0  11.0  11.8   Hemoglobin 13.0 - 17.0 g/dL 56.2  13.0  86.5   Hematocrit 39.0 - 52.0 % 31.2  31.4  34.5   Platelets 150 - 400 K/uL 86  102.0  88        Latest Ref Rng & Units 10/08/2023    8:49 AM 07/24/2023   12:11 PM 06/08/2023   12:53 PM  CMP  Glucose 70 - 99 mg/dL 784  99  696   BUN 6 - 20 mg/dL 15  10  13    Creatinine 0.61 - 1.24  mg/dL 4.40  1.02  7.25   Sodium 135 - 145 mmol/L 132  130  135   Potassium 3.5 - 5.1 mmol/L 3.5  3.4  3.9   Chloride 98 - 111 mmol/L 100  99  103   CO2 22 - 32 mmol/L 22  26  25    Calcium 8.9 - 10.3 mg/dL 9.1  8.2  9.1   Total Protein 6.5 - 8.1 g/dL 7.5  6.6  7.8   Total Bilirubin 0.3 - 1.2 mg/dL 9.4  6.6  8.9   Alkaline Phos 38 - 126 U/L 261  419  276   AST 15 - 41 U/L 69  54  76   ALT 0 - 44 U/L 28  21  27      AFP 3.8 on 05/27/2023  MELD 3.0: 25 at 10/08/2023  9:08 AM MELD-Na: 24 at 10/08/2023  9:08 AM Calculated from: Serum Creatinine: 1.03 mg/dL at 36/64/4034  7:42 AM Serum Sodium: 132 mmol/L at 10/08/2023  8:49 AM Total Bilirubin: 9.4 mg/dL at 59/56/3875  6:43  AM Serum Albumin: 2.4 g/dL at 32/95/1884  1:66 AM INR(ratio): 1.8 at 10/08/2023  9:08 AM Age at listing (hypothetical): 57 years Sex: Male at 10/08/2023  9:08 AM  Labs 08/05/2022: Hep B surface antibody nonreactive. Hep B core total antibody nonreactive. Hep B surface antigen nonreactive. Hep A total antibody reactive Labs 02/21/2022: Hep C antibody nonreactive  IMAGE STUDIES:  PARACENTESIS 10/08/2023: A total of approximately 2 L of clear yellow fluid was removed.  PARACENTESIS 06/08/2023: Successful ultrasound-guided therapeutic paracentesis yielding 1.3 liters of peritoneal fluid.  RENAL PROTOCOL CT WITHOUT CONTRAST 06/08/2023:  Lower chest: No acute abnormality.   Hepatobiliary: Nodular contour of the liver with hypertrophic left hepatic lobe consistent with cirrhosis. Gallbladder is markedly distended. Small gallstone in the dependent gallbladder without gallbladder wall thickening, or biliary dilatation.   Pancreas: Unremarkable. No pancreatic ductal dilatation or surrounding inflammatory changes.   Spleen: Normal in size without focal abnormality.   Adrenals/Urinary Tract: Adrenal glands are unremarkable. Kidneys are normal, without renal calculi, focal lesion, or hydronephrosis. Bladder is unremarkable.   Stomach/Bowel: Stomach is within normal limits. Appendix appears normal. No evidence of bowel wall thickening, distention, or inflammatory changes.   Vascular/Lymphatic: Aortic atherosclerosis. No enlarged abdominal or pelvic lymph nodes.   Reproductive: Prostate is unremarkable.   Other: Moderate ascites. Small left inguinal hernia containing fluid. Small supraumbilical fat containing ventral hernia. Mild generalized anasarca with skin thickening about the umbilicus.   Musculoskeletal: Multilevel degenerate disc disease of the lumbar spine. No acute osseous abnormality   IMPRESSION: 1. Cirrhotic morphology of the liver with moderate ascites. 2. Cholelithiasis  without evidence of acute cholecystitis. 3. No evidence of nephrolithiasis or hydronephrosis. 4. Normal appendix. No evidence of colitis or diverticulitis. 5. Small left inguinal hernia containing fluid. 6. Mild generalized anasarca with skin thickening about the umbilicus. 7. Multilevel degenerate disc disease of the lumbar spine. 8. Aortic atherosclerosis.   ABDOMINAL MRI/MRCP 04/05/2023: FINDINGS: Markedly motion degraded study.   Lower chest: Tiny bilateral effusions.   Hepatobiliary: Evaluation of the liver is markedly motion degraded. Within this limitation, contour appears nodular. No focal or discrete restricted diffusion within the liver parenchyma. Postcontrast imaging shows no focal arterial phase hyperenhancement. No suspicious focal abnormality within the liver. Gallbladder is markedly distended. No gallbladder wall thickening or mural edema evident by MRI. No discernible gallstones. Evaluation of bile ducts is markedly motion degraded. Common duct in the porta hepatis measures on the order  of 10 mm diameter. There is gradual tapering of the common bile duct measuring 6-7 mm diameter just proximal to the ampulla. No definite choledocholithiasis.   Pancreas: Motion degraded. No gross pancreatic lesion evident. No main duct dilatation.   Spleen:  No focal mass lesion evident.   Adrenals/Urinary Tract: No adrenal mass evident. No hydronephrosis. Postcontrast imaging of the kidneys is motion degraded but no appreciable enhancing mass lesion evident.   Stomach/Bowel: Stomach is nondistended. Wall thickening noted in the gastric antrum on CT earlier today is less prominent by MRI. Duodenum is normally positioned as is the ligament of Treitz. No small bowel or colonic dilatation in the abdomen.   Vascular/Lymphatic: No abdominal aortic aneurysm. Portal vein, superior mesenteric vein, and splenic vein appear patent but assessment is markedly degraded by motion artifact.  No abdominal lymphadenopathy   Other: Moderate volume ascites with diffuse mesenteric and body wall edema. Supraumbilical ventral hernia contains fat and fluid.   Musculoskeletal: No focal suspicious marrow enhancement within the visualized bony anatomy.   IMPRESSION: 1. Markedly motion degraded study. Small or subtle lesions in the solid abdominal viscera could be obscured. 2. Distended gallbladder. Calcified gallstone(s) seen on CT earlier today are not discernible by MRI, likely obscured by motion artifact. No gallbladder wall thickening or mural edema evident by MRI. 3. Nodular hepatic contour suggests cirrhosis. 4. Mild dilatation of the common bile duct without definite choledocholithiasis. Common bile duct tapers gradually into the ampulla. 5. Moderate volume ascites with diffuse mesenteric and body wall edema. Tiny bilateral pleural effusions. 6. Supraumbilical ventral hernia contains fat and fluid.   ECHO 02/22/2022:  IMPRESSIONS Left ventricular ejection fraction, by estimation, is 65 to 70%. The left ventricle has hyperdynamic function. The left ventricle has no regional wall motion abnormalities. Left ventricular diastolic parameters were normal. 1. Right ventricular systolic function is hyperdynamic. The right ventricular size is normal. Tricuspid regurgitation signal is inadequate for assessing PA pressure. 2. 3. Left atrial size was mildly dilated. 4. The mitral valve is normal in structure. No evidence of mitral valve regurgitation. The aortic valve is tricuspid. Aortic valve regurgitation is not visualized. No aortic stenosis is present.   PAST GI PROCEDURES:  EGD 07/10/2023: - No gross lesions in the proximal esophagus and in the mid esophagus.  - Grade I and grade II esophageal varices noted distally.  - Non-obstructing Schatzki ring.  - 1 cm hiatal hernia.  - Type 1 isolated gastric varices (IGV1, varices located in the fundus).  - Portal hypertensive gastropathy.   - Erosive gastropathy with no bleeding and no stigmata of recent bleeding. Biopsied. - No gross lesions in the duodenal bulb, in the first portion of the duodenum and in the second portion of the duodenum. -Recall EGD 3 years  -Prescribed Nadolol 10mg  po every day, never started it   Colonoscopy 07/10/2023: - Hemorrhoids found on digital rectal exam. - There was significant looping of the colon. - Seven 2 to 10 mm polyps in the sigmoid colon, at the splenic flexure, at the hepatic flexure, in the ascending colon and in the cecum, removed with a cold snare. Resected and retrieved. - One 25 mm polyp in the sigmoid colon, removed with mucosal resection. Resected and retrieved. Clips (MR conditional) were placed. Tattooed. - Diverticulosis in the recto-sigmoid colon, in the sigmoid colon, in the descending colon and in the ascending colon. - Normal mucosa in the entire examined colon. - Non-bleeding non-thrombosed external and internal hemorrhoids. - Recall colonoscopy 3 years  1. Surgical [P], gastric bx's, random - ANTRAL AND OXYNTIC MUCOSA WITH CHEMICAL/REACTIVE CHANGE, SUPERFICIAL LAMINA PROPRIA EDEMA WITH MILD FIBROSIS AND INCREASED ECTATIC CAPILLARIES/LYMPHATICS. - NO HELICOBACTER PYLORI ORGANISMS IDENTIFIED ON H&E STAINED SLIDE. NOTE: IT IS NOTED THAT THE PATIENT HAS CIRRHOSIS OF THE LIVER WITH ASCITES. THE ABOVE FINDINGS ARE SUGGESTIVE OF PORTAL HYPERTENSIVE GASTROPATHY; HOWEVER, THE PATHOGNOMONIC FEATURES (DILATED AND TORTUOUS VEINS) ARE ONLY SEEN IN THE SUBMUCOSA TO A VARIABLE DEGREE, AND THE FINDINGS IN THE SUPERFICIAL MUCOSA ARE SUGGESTIVE OF BUT NOT PATHOGNOMONIC FOR PORTAL HYPERTENSIVE GASTROPATHY. 2. Surgical [P], duodenal bx's - DUODENAL MUCOSA WITH NO SIGNIFICANT PATHOLOGY. 3. Surgical [P], colon, cecal polyp x 1; ascending polyp x 1; hepatic flexure polyp x 2, splenic flexure polyp x 1, sigmoid polyp x 2, polyp (5) - NUMEROUS FRAGMENTS OF HYPERPLASTIC/SERRATED POLYPS WITH  FEATURES RANGING FROM DEFINITIVE SESSILE SERRATED LESIONS WITHOUT DYSPLASIA TO THOSE MORE LIKELY REPRESENTING HYPERPLASTIC POLYPS. 4. Surgical [P], colon, sigmoid polyp EMR x 1, polyp (1) - TUBULAR ADENOMA, MARGINS NEGATIVE.  Current Outpatient Medications on File Prior to Visit  Medication Sig Dispense Refill   cyclobenzaprine (FLEXERIL) 5 MG tablet Take 1 tablet (5 mg total) by mouth 3 (three) times daily as needed for muscle spasms. 15 tablet 0   furosemide (LASIX) 40 MG tablet Take 1 tablet (40 mg total) by mouth daily. 30 tablet 11   nadolol (CORGARD) 20 MG tablet Take 0.5 tablets (10 mg total) by mouth daily. 90 tablet 0   ondansetron (ZOFRAN) 4 MG tablet Take 1 tablet (4 mg total) by mouth every 8 (eight) hours as needed for nausea or vomiting. 12 tablet 11   oxyCODONE (ROXICODONE) 5 MG immediate release tablet Take 1 tablet (5 mg total) by mouth every 4 (four) hours as needed for severe pain. 10 tablet 0   QUEtiapine (SEROQUEL) 25 MG tablet TAKE 1 TABLET BY MOUTH AT BEDTIME NEED  OFFICE  VISIT (Patient taking differently: Take 25 mg by mouth at bedtime.) 30 tablet 0   spironolactone (ALDACTONE) 50 MG tablet Take 1 tablet (50 mg total) by mouth in the morning. 30 tablet 11   PARoxetine (PAXIL) 20 MG tablet Take 1 tablet (20 mg total) by mouth daily. FURTHER REFILLS TO COME FROM PCP (Patient taking differently: Take 20 mg by mouth daily.) 30 tablet 0   No current facility-administered medications on file prior to visit.   Allergies  Allergen Reactions   Nsaids Other (See Comments)    Cannot take due to liver condition   Current Medications, Allergies, Past Medical History, Past Surgical History, Family History and Social History were reviewed in [employer] record.  Review of Systems:   Constitutional: Negative for fever, sweats, chills or unintentional weight loss.  Respiratory: + Shortness of breath.   Cardiovascular: See HPI. No current chest pain.   Gastrointestinal: See HPI.  Musculoskeletal: + Right leg pain. Neurological: Negative for dizziness, headaches or paresthesias.   Physical Exam: There were no vitals taken for this visit. BP (!) 110/58 (BP Location: Left Arm, Patient Position: Sitting, Cuff Size: Normal)   Pulse 86   Ht 5\' 9"  (1.753 m)   Wt 207 lb (93.9 kg)   SpO2 95%   BMI 30.57 kg/m   General: 57 year old male chronically ill-appearing in no acute distress, altered gait secondary to right leg injury, using a walker.  Head: Normocephalic and atraumatic. Eyes: Mild scleral icterus. Conjunctiva pink . Ears: Normal auditory acuity. Mouth: Poor dentition.  No ulcers or lesions.  Lungs:  Clear throughout to auscultation. Chest: Bilateral gynecomastia. Heart: Regular rate and rhythm, no murmur. Abdomen: Soft, nontender.  Small amount of ascites present.  Abdomen is not tense.  No masses or hepatomegaly. Normal bowel sounds x 4 quadrants.  Rectal: Deferred. Musculoskeletal: Symmetrical with no gross deformities. Extremities: Bilateral lower extremities with 1+ edema. Skin: + Jaundice.  Neurological: Alert oriented x 4.  Beach is clear.  Moves all extremities.  Hands are mildly tremulous without asterixis. Psychological: Alert and cooperative. Normal mood and affect  Assessment and Recommendations:  57 year old male with alcohol associated decompensated cirrhosis with grade I/II esophageal varices, type I isolated gastric varices and portal hypertensive gastropathy. MELD 3.0: 25. T. Bili 9.4. Alk phos 261. AST 69. ALT 28. Recurrent ascites with LE edema despite taking Furosemide and Spironolactone as prescribed. S/P paracentesis in the ED 10/31, 2L peritoneal fluid removed. No SBP. Recent onset of bilateral gynecomastia secondary to Spironolactone. EGD 07/2023 which showed grade I/II EV, IGV1 and portal hypertensive gastropathy. Never started Nadolol. Occasionally foggy headed, without overt confusion. Abstinent from alcohol  since 12/2021.  -CBC, BMP, hepatic panel, PT/INR -Therapeutic paracentesis as needed, patient does not need a paracentesis at this time -Strict low sodium diet, patient instructed to avoid processed foods -Continue Furosemide 40mg  po every day, to increase dose after today's BMP results received  -Stop Spironolactone secondary to gynecomastia, will increase dose of furosemide and likely start Eplerenone, await the above lab results. To discuss diuretic regimen further with Dr. Meridee Score. -Surveillance sonogram due 12/2023 (CTAP without contrast 06/2023 and MRI 03/2023 without hepatoma) -AFP due mid 11/2023 -Dr. Meridee Score to verify surveillance EGD recall date as patient did not start Nadolol post EGD 07/2023. Resend RX for [employer] 10mg  every day?  -Consider repeating ECHO  -Hep B vaccination, next office visit  -Patient to proceed with appointment with Annamarie Major NP at [facility] 11/26/2023 as scheduled   Gynecomastia secondary to Spironolactone  -See plan above   Macrocytic anemia secondary to cirrhosis. Hg 11.4.   Thrombocytopenia secondary to cirrhosis. PLT 86.   Coagulopathy secondary to cirrhosis. INR 1.8.   History of colon polyps. Colonoscopy 07/2023 showed 7 hyperplastic/serrated polyps removed from the colon, one 25 mm TA removed by mucosal resection and diverticulosis. -Colon polyp surveillance due 07/2026  Chest pain. Patient endorsed having chest pain 3 nights ago lasted 30 minutes without recurrence. -Patient instructed to go to the ED if CP recurs

## 2023-10-20 NOTE — Progress Notes (Signed)
I called patient at this time. He is aware to stop Spironolactone (as discussed during his appointment yesterday secondary to gynecomastia) and to continue Furosemide 40mg  every day. I discussed adding a new diuretic (Eplerenone to replace Spironolactone which wont' cause gynecomastia). Will need to see if [facility] pharmacy has Eplerenone available.   Nursing staff covering for Kaira/Steven,   Pls send in RX to [facility] pharmacy per patient's request for Eplerenone 25mg  one tab po Q morning # 30, no refills.  Send patient back to our lab in 1 week after starting Eplerenone and schedule a nurse appointment for BP and weight in 2 weeks on day I am in clinic.   Pls also send in RX for Ondansetron ODT 4 mg tab one tab dissolve on tongue Q 6 hrs PRN nausea # 30, no refills.   Patient endorsed having dry heaves earlier today, vomited up his breakfast, no coffee ground, black or bloody emesis. Patient was instructed to go to the ED if he vomits up coffee ground/black or bloody emesis.   Patient will call me if pharmacy cannot get Eplerenone.

## 2023-10-20 NOTE — Progress Notes (Signed)
Attending Physician's Attestation   I have reviewed the chart.   I agree with the Advanced Practitioner's note, impression, and recommendations with any updates as below. If we are able to get eplerenone then would start with 25 mg daily and monitor blood pressure and electrolytes. If we are not able to get eplerenone, then would initiate amiloride 10 mg daily with plan to go to 10 mg twice daily within a couple of weeks. Can hold on nadolol until we see what goes on in regards to his blood pressure and vital signs with initiation of the diuretic therapy. If we can set up a blood pressure check in a couple of weeks after initiation of medications that may make the most sense. And agree has to go to [facility] for previously missed liver clinic evaluation.   Corliss Parish, MD [facility] Office # [phone]

## 2023-10-21 ENCOUNTER — Other Ambulatory Visit (HOSPITAL_COMMUNITY): Payer: Self-pay

## 2023-10-21 LAB — PATHOLOGIST SMEAR REVIEW

## 2023-10-22 ENCOUNTER — Other Ambulatory Visit (HOSPITAL_COMMUNITY): Payer: Self-pay

## 2023-10-23 ENCOUNTER — Telehealth: Payer: Self-pay | Admitting: Nurse Practitioner

## 2023-10-23 ENCOUNTER — Other Ambulatory Visit (HOSPITAL_COMMUNITY): Payer: Self-pay

## 2023-10-23 NOTE — Telephone Encounter (Signed)
Inbound call from [facility]. States a prior Berkley Harvey is needed for Nadolol medication. Requested for prior auth to be dated for today 11/15. Please advise, thank you.

## 2023-10-24 ENCOUNTER — Other Ambulatory Visit (HOSPITAL_COMMUNITY): Payer: Self-pay

## 2023-10-26 ENCOUNTER — Other Ambulatory Visit (HOSPITAL_COMMUNITY): Payer: Self-pay

## 2023-10-26 ENCOUNTER — Other Ambulatory Visit: Payer: Self-pay

## 2023-10-26 ENCOUNTER — Telehealth: Payer: Self-pay

## 2023-10-26 DIAGNOSIS — K7031 Alcoholic cirrhosis of liver with ascites: Secondary | ICD-10-CM

## 2023-10-26 DIAGNOSIS — R110 Nausea: Secondary | ICD-10-CM

## 2023-10-26 MED ORDER — ONDANSETRON 4 MG PO TBDP
4.0000 mg | ORAL_TABLET | Freq: Four times a day (QID) | ORAL | 0 refills | Status: DC | PRN
  Filled 2023-10-26: qty 30, 8d supply, fill #0

## 2023-10-26 MED ORDER — EPLERENONE 25 MG PO TABS
25.0000 mg | ORAL_TABLET | Freq: Every day | ORAL | 0 refills | Status: DC
  Filled 2023-10-26: qty 30, 30d supply, fill #0

## 2023-10-26 NOTE — Telephone Encounter (Signed)
Prescription sent to pharmacy for Eplerenone 25mg  one tab po Q morning # 30, no refills and RX for Ondansetron ODT 4 mg tab one tab dissolve on tongue Q 6 hrs PRN nausea # 30, no refills.  Order for lab placed in Epic.  Left message for pt to call back

## 2023-10-26 NOTE — Telephone Encounter (Signed)
Message Received: 6 days ago Arnaldo Natal, NP  Lucky Cowboy, RN; Emeline Darling, RN      Previous Messages  Routed Note  Author: Arnaldo Natal, NP Service: Gastroenterology Author Type: Nurse Practitioner  Filed: 10/20/2023  1:38 PM Encounter Date: 10/19/2023 Status: Signed  Editor: Arnaldo Natal, NP (Nurse Practitioner)  I called patient at this time. He is aware to stop Spironolactone (as discussed during his appointment yesterday secondary to gynecomastia) and to continue Furosemide 40mg  every day. I discussed adding a new diuretic (Eplerenone to replace Spironolactone which wont' cause gynecomastia). Will need to see if [facility] pharmacy has Eplerenone available.    Nursing staff covering for Kaira/Sanchez Hemmer,    Pls send in RX to [facility] pharmacy per patient's request for Eplerenone 25mg  one tab po Q morning # 30, no refills.  Send patient back to our lab in 1 week after starting Eplerenone and schedule a nurse appointment for BP and weight in 2 weeks on day I am in clinic.    Pls also send in RX for Ondansetron ODT 4 mg tab one tab dissolve on tongue Q 6 hrs PRN nausea # 30, no refills.    Patient endorsed having dry heaves earlier today, vomited up his breakfast, no coffee ground, black or bloody emesis. Patient was instructed to go to the ED if he vomits up coffee ground/black or bloody emesis.    Patient will call me if pharmacy cannot get Eplerenone.      Office Visit for Cirrhosis; Bloated 10/19/2023 Arnaldo Natal, NP - [facility] Diagnoses  Esophageal Varices Without Bleeding, Unspecified Esophageal Varices Type (hcc) (Primary) Alcoholic cirrhosis, unspecified whether ascites present (HCC) Orders Signed This Visit  (4) INR/PT Hepatic function panel Basic Metabolic Panel (BMET) CBC w/Diff Orders Pended This Visit  None Progress Notes  Arnaldo Natal, NP at 10/19/2023  10:30 AM  Status: Signed  I called patient at this time. He is aware to stop Spironolactone (as discussed during his appointment yesterday secondary to gynecomastia) and to continue Furosemide 40mg  every day. I discussed adding a new diuretic (Eplerenone to replace Spironolactone which wont' cause gynecomastia). Will need to see if [facility] pharmacy has Eplerenone available.    Nursing staff covering for Kaira/Yocheved Depner,    Pls send in RX to [facility] pharmacy per patient's request for Eplerenone 25mg  one tab po Q morning # 30, no refills.  Send patient back to our lab in 1 week after starting Eplerenone and schedule a nurse appointment for BP and weight in 2 weeks on day I am in clinic.    Pls also send in RX for Ondansetron ODT 4 mg tab one tab dissolve on tongue Q 6 hrs PRN nausea # 30, no refills.    Patient endorsed having dry heaves earlier today, vomited up his breakfast, no coffee ground, black or bloody emesis. Patient was instructed to go to the ED if he vomits up coffee ground/black or bloody emesis.    Patient will call me if pharmacy cannot get Eplerenone.     Mansouraty, Netty Starring., MD at 10/19/2023 10:30 AM  Status: Signed   Attending Physician's Attestation    I have reviewed the chart.    I agree with the Advanced Practitioner's note, impression, and recommendations with any updates as below. If we are able to get eplerenone then would start with 25 mg daily and monitor blood pressure and electrolytes. If we are not  able to get eplerenone, then would initiate amiloride 10 mg daily with plan to go to 10 mg twice daily within a couple of weeks. Can hold on nadolol until we see what goes on in regards to his blood pressure and vital signs with initiation of the diuretic therapy. If we can set up a blood pressure check in a couple of weeks after initiation of medications that may make the most sense. And agree has to go to [facility] for previously missed liver clinic  evaluation.     Corliss Parish, MD [facility] Office # [phone]

## 2023-10-26 NOTE — Telephone Encounter (Signed)
Pharmacy Patient Advocate Encounter   Received notification from RX Request Messages that prior authorization for Nadolol 20 mg tablets is required/requested.   Insurance verification completed.   The patient is insured through [facility] .   Per test claim: PA required; PA started via CoverMyMeds. KEY [id number] . Waiting for clinical questions to populate.

## 2023-10-26 NOTE — Telephone Encounter (Signed)
PA request has been Submitted. New Encounter created for follow up. For additional info see Pharmacy Prior Auth telephone encounter from 10/26/2023.

## 2023-10-27 ENCOUNTER — Other Ambulatory Visit (HOSPITAL_COMMUNITY): Payer: Self-pay

## 2023-10-27 NOTE — Telephone Encounter (Signed)
Left message for pt to call back  °

## 2023-10-27 NOTE — Telephone Encounter (Signed)
Pt was made aware of Dr. Meridee Score and Alcide Evener NP recommendations in detail. Pt was notified that the prescriptions have been sent to [facility]. Phone number provided to pt. Pt was notified to come by our lab in 1 week after starting the medication. Order for lab has been placed in Epic. Pt aware. Pt notified to come for an BP and wight check on 12/3./2024 at 10:00 PM. Pt made aware Pt notified to hold on nadolol until we see what goes on in regards to his blood pressure and vital signs with initiation of the diuretic therapy.  Pt verbalized understanding with all questions answered.

## 2023-10-28 ENCOUNTER — Other Ambulatory Visit (HOSPITAL_COMMUNITY): Payer: Self-pay

## 2023-11-04 ENCOUNTER — Other Ambulatory Visit (INDEPENDENT_AMBULATORY_CARE_PROVIDER_SITE_OTHER): Payer: Medicaid Other

## 2023-11-04 DIAGNOSIS — K7031 Alcoholic cirrhosis of liver with ascites: Secondary | ICD-10-CM

## 2023-11-04 LAB — COMPREHENSIVE METABOLIC PANEL
ALT: 22 U/L (ref 0–53)
AST: 55 U/L — ABNORMAL HIGH (ref 0–37)
Albumin: 2.5 g/dL — ABNORMAL LOW (ref 3.5–5.2)
Alkaline Phosphatase: 319 U/L — ABNORMAL HIGH (ref 39–117)
BUN: 11 mg/dL (ref 6–23)
CO2: 29 meq/L (ref 19–32)
Calcium: 8.4 mg/dL (ref 8.4–10.5)
Chloride: 98 meq/L (ref 96–112)
Creatinine, Ser: 1.31 mg/dL (ref 0.40–1.50)
GFR: 61.18 mL/min (ref >60.00)
Glucose, Bld: 107 mg/dL — ABNORMAL HIGH (ref 70–99)
Potassium: 3.8 meq/L (ref 3.5–5.1)
Sodium: 134 meq/L — ABNORMAL LOW (ref 135–145)
Total Bilirubin: 8.2 mg/dL — ABNORMAL HIGH (ref 0.2–1.2)
Total Protein: 6.4 g/dL (ref 6.0–8.3)

## 2023-11-10 ENCOUNTER — Ambulatory Visit (INDEPENDENT_AMBULATORY_CARE_PROVIDER_SITE_OTHER): Payer: Medicaid Other | Admitting: Nurse Practitioner

## 2023-11-10 VITALS — BP 134/66 | Ht 69.0 in | Wt 210.8 lb

## 2023-11-10 DIAGNOSIS — K7469 Other cirrhosis of liver: Secondary | ICD-10-CM

## 2023-11-10 NOTE — Progress Notes (Signed)
Increase eplerenone to 50 mg daily. Repeat BMP in 2 weeks.  We may run out of room of diuretic therapy depending on the hyponatremia. Suspect he will need to go ahead and get a paracentesis and would move forward with scheduling that. Thanks. GM

## 2023-11-10 NOTE — Progress Notes (Signed)
Nathaniel Warren, pls contact patient Wed 11/11/2023 in am. Let him know Dr. Meridee Score recommended for him to increase Eplerenone to 25 mg to two tabs (50mg ) once daily (ok to send new RX for Eplerenone 25mg  tab take two tabs (50mg ) po once daily # 60, no refills. Patient to continue Furosemide 40mg  once daily as previously prescribed. Please send patient to lab in 2 weeks for a BMP, hepatic panel, CBC and PT/INR.   Pls schedule patient for a paracentesis at [facility], max peritoneal fluid to be removed 5L. Pls enter order for Albumin 50gm IV to be administered at time of paracentesis. Peritoneal fluid labs as follows: cell count with diff, gram stain, and aerobic/anaerobic cultures. THX.

## 2023-11-10 NOTE — Progress Notes (Signed)
Patient presented for RN visit for BP, HR and weight check.  Wt Readings from Last 3 Encounters:  11/10/23 210 lb 12.8 oz (95.6 kg)  10/19/23 207 lb (93.9 kg)  10/08/23 177 lb (80.3 kg)    Refer to office visit 10/19/2023, Spironolactone was discontinued secondary to gynecomastia. Patient was instructed to continue Furosemide 40mg  every day and was subsequently started on Eplerenone 25mg  po every day, first dose 10/26/2023.  Labs 11/04/2023 showed Na+ 124. Cr 1.31 9 Cr 1.19 on 11/11). BUN 11.   Dr. Meridee Score, called Nathaniel Warren and he feels the Eplerenone has done nothing.  He feels more swelling to his abdomen, arms and legs.  Please let me know if you want to increase his furosemide and Eplerenone doses.  He will likely need another paracentesis this week.  Thank you.

## 2023-11-11 ENCOUNTER — Other Ambulatory Visit (HOSPITAL_COMMUNITY): Payer: Self-pay

## 2023-11-11 ENCOUNTER — Other Ambulatory Visit: Payer: Self-pay

## 2023-11-11 ENCOUNTER — Telehealth: Payer: Self-pay

## 2023-11-11 DIAGNOSIS — K7031 Alcoholic cirrhosis of liver with ascites: Secondary | ICD-10-CM

## 2023-11-11 MED ORDER — EPLERENONE 25 MG PO TABS
50.0000 mg | ORAL_TABLET | Freq: Every day | ORAL | 0 refills | Status: DC
  Filled 2023-11-11 – 2023-11-20 (×2): qty 60, 30d supply, fill #0

## 2023-11-11 NOTE — Telephone Encounter (Signed)
Message Received: Percell Locus, Malachi Carl, NP  Emeline Darling, RN      Previous Messages  Routed Note  Author: Arnaldo Natal, NP Service: Gastroenterology Author Type: Nurse Practitioner  Filed: 11/10/2023  6:55 PM Encounter Date: 11/10/2023 Status: Signed  Editor: Arnaldo Natal, NP (Nurse Practitioner)  Viviann Spare, pls contact patient Wed 11/11/2023 in am. Let him know Dr. Meridee Score recommended for him to increase Eplerenone to 25 mg to two tabs (50mg ) once daily (ok to send new RX for Eplerenone 25mg  tab take two tabs (50mg ) po once daily # 60, no refills. Patient to continue Furosemide 40mg  once daily as previously prescribed. Please send patient to lab in 2 weeks for a BMP, hepatic panel, CBC and PT/INR.    Pls schedule patient for a paracentesis at [facility], max peritoneal fluid to be removed 5L. Pls enter order for Albumin 50gm IV to be administered at time of paracentesis. Peritoneal fluid labs as follows: cell count with diff, gram stain, and aerobic/anaerobic cultures. THX.        Clinical Support for Blood Pressure Check; Weight Check 11/10/2023 Arnaldo Natal, NP - [facility] Gastroenterology Diagnosis  Other Cirrhosis Of Liver (hcc) (Primary) Orders Signed This Visit  None Orders Pended This Visit  None Progress Notes  Arnaldo Natal, NP at 11/10/2023 10:00 AM  Status: Signed  Viviann Spare, pls contact patient Wed 11/11/2023 in am. Let him know Dr. Meridee Score recommended for him to increase Eplerenone to 25 mg to two tabs (50mg ) once daily (ok to send new RX for Eplerenone 25mg  tab take two tabs (50mg ) po once daily # 60, no refills. Patient to continue Furosemide 40mg  once daily as previously prescribed. Please send patient to lab in 2 weeks for a BMP, hepatic panel, CBC and PT/INR.    Pls schedule patient for a paracentesis at [facility], max peritoneal fluid to be removed 5L. Pls enter order for Albumin 50gm IV to be administered  at time of paracentesis. Peritoneal fluid labs as follows: cell count with diff, gram stain, and aerobic/anaerobic cultures. THX.       Mansouraty, Netty Starring., MD at 11/10/2023 10:00 AM  Status: Signed  Increase eplerenone to 50 mg daily. Repeat BMP in 2 weeks.  We may run out of room of diuretic therapy depending on the hyponatremia. Suspect he will need to go ahead and get a paracentesis and would move forward with scheduling that. Thanks. GM     Arnaldo Natal, NP at 11/10/2023 10:00 AM  Status: Signed  Patient presented for RN visit for BP, HR and weight check.     Wt Readings from Last 3 Encounters:  11/10/23 210 lb 12.8 oz (95.6 kg)  10/19/23 207 lb (93.9 kg)  10/08/23 177 lb (80.3 kg)    Refer to office visit 10/19/2023, Spironolactone was discontinued secondary to gynecomastia. Patient was instructed to continue Furosemide 40mg  every day and was subsequently started on Eplerenone 25mg  po every day, first dose 10/26/2023.   Labs 11/04/2023 showed Na+ 124. Cr 1.31 9 Cr 1.19 on 11/11). BUN 11.    Dr. Meridee Score, called Mr. Awtrey and he feels the Eplerenone has done nothing.  He feels more swelling to his abdomen, arms and legs.  Please let me know if you want to increase his furosemide and Eplerenone doses.  He will likely need another paracentesis this week.  Thank you.

## 2023-11-11 NOTE — Telephone Encounter (Signed)
Pt was made aware of Dr. Meridee Score and Alcide Evener NP recommendations: Prescription was sent to pharmacy. Pt made aware. Orders for labs placed in Epic to be done in 2 weeks. Pt made aware. Location to lab provided.  Pt was scheduled for the paracentesis at [facility] on 11/12/2023 at 1:00 PM. Pt to arrive at 12:30 Pt made aware.  Pt verbalized understanding with all questions answered.

## 2023-11-11 NOTE — Telephone Encounter (Deleted)
-----   Message from Arnaldo Natal sent at 11/10/2023  6:50 PM EST -----    ----- Message ----- From: Lemar Lofty., MD Sent: 11/10/2023   5:15 PM EST To: Arnaldo Natal, NP     ----- Message ----- From: Arnaldo Natal, NP Sent: 11/10/2023   4:45 PM EST To: Lemar Lofty., MD  Dr. Meridee Score, pls see updated note.  Let me know if you want to increase his furosemide and Eplerenone doses.  Thank you.

## 2023-11-12 ENCOUNTER — Other Ambulatory Visit (HOSPITAL_COMMUNITY): Payer: Self-pay

## 2023-11-12 ENCOUNTER — Ambulatory Visit (HOSPITAL_COMMUNITY)
Admission: RE | Admit: 2023-11-12 | Discharge: 2023-11-12 | Disposition: A | Discharge status: Home / Self Care | Payer: Medicaid Other | Source: Ambulatory Visit | Attending: Nurse Practitioner | Admitting: Nurse Practitioner

## 2023-11-12 DIAGNOSIS — K7031 Alcoholic cirrhosis of liver with ascites: Secondary | ICD-10-CM | POA: Diagnosis present

## 2023-11-12 HISTORY — PX: IR PARACENTESIS: IMG2679

## 2023-11-12 LAB — BODY FLUID CELL COUNT WITH DIFFERENTIAL
Eos, Fluid: 0 %
Lymphs, Fluid: 33 %
Monocyte-Macrophage-Serous Fluid: 60 % (ref 50–90)
Neutrophil Count, Fluid: 7 % (ref 0–25)
Total Nucleated Cell Count, Fluid: 44 uL (ref 0–1000)

## 2023-11-12 LAB — GRAM STAIN

## 2023-11-12 MED ORDER — ALBUMIN HUMAN 25 % IV SOLN
INTRAVENOUS | Status: AC
Start: 2023-11-12 — End: ?
  Filled 2023-11-12: qty 200

## 2023-11-12 MED ORDER — LIDOCAINE HCL 1 % IJ SOLN
20.0000 mL | Freq: Once | INTRAMUSCULAR | Status: AC
  Administered 2023-11-12: 10 mL via INTRADERMAL

## 2023-11-12 MED ORDER — ALBUMIN HUMAN 25 % IV SOLN
50.0000 g | Freq: Once | INTRAVENOUS | Status: AC
Start: 2023-11-12 — End: 2023-11-12
  Administered 2023-11-12: 50 g via INTRAVENOUS

## 2023-11-12 MED ORDER — LIDOCAINE HCL 1 % IJ SOLN
INTRAMUSCULAR | Status: AC
  Filled 2023-11-12: qty 20

## 2023-11-12 NOTE — Procedures (Signed)
PROCEDURE SUMMARY:  Successful ultrasound guided paracentesis from the right lower quadrant.  Yielded 3.6 L of clear yellow fluid.  No immediate complications.  The patient tolerated the procedure well.   Specimen sent for labs.  EBL < 2 mL  If the patient eventually requires >/=2 paracenteses in a 30 day period, screening evaluation by the [facility] will be assessed.  Alwyn Ren, [place] [phone] 11/12/2023, 3:21 PM

## 2023-11-13 LAB — PATHOLOGIST SMEAR REVIEW

## 2023-11-17 LAB — AEROBIC/ANAEROBIC CULTURE W GRAM STAIN (SURGICAL/DEEP WOUND): Culture: NO GROWTH

## 2023-11-17 LAB — CULTURE, BODY FLUID W GRAM STAIN -BOTTLE: Culture: NO GROWTH

## 2023-11-20 ENCOUNTER — Other Ambulatory Visit: Payer: Self-pay | Admitting: Gastroenterology

## 2023-11-20 ENCOUNTER — Other Ambulatory Visit: Payer: Self-pay | Admitting: Nurse Practitioner

## 2023-11-20 ENCOUNTER — Other Ambulatory Visit (HOSPITAL_COMMUNITY): Payer: Self-pay

## 2023-11-20 DIAGNOSIS — F4310 Post-traumatic stress disorder, unspecified: Secondary | ICD-10-CM

## 2023-11-20 DIAGNOSIS — F411 Generalized anxiety disorder: Secondary | ICD-10-CM

## 2023-11-20 DIAGNOSIS — F332 Major depressive disorder, recurrent severe without psychotic features: Secondary | ICD-10-CM

## 2023-11-20 MED ORDER — QUETIAPINE FUMARATE 25 MG PO TABS
25.0000 mg | ORAL_TABLET | Freq: Every day | ORAL | 1 refills | Status: DC
  Filled 2023-11-20: qty 30, 30d supply, fill #0

## 2023-11-20 NOTE — Telephone Encounter (Signed)
Please advise Kh

## 2023-11-21 ENCOUNTER — Other Ambulatory Visit (HOSPITAL_COMMUNITY): Payer: Self-pay

## 2023-11-25 ENCOUNTER — Other Ambulatory Visit (INDEPENDENT_AMBULATORY_CARE_PROVIDER_SITE_OTHER): Payer: Medicaid Other

## 2023-11-25 ENCOUNTER — Other Ambulatory Visit: Payer: Self-pay

## 2023-11-25 DIAGNOSIS — K7031 Alcoholic cirrhosis of liver with ascites: Secondary | ICD-10-CM

## 2023-11-25 LAB — PROTIME-INR
INR: 2.2 {ratio} — ABNORMAL HIGH (ref 0.8–1.0)
Prothrombin Time: 22.9 s — ABNORMAL HIGH (ref 9.6–13.1)

## 2023-11-25 LAB — CBC WITH DIFFERENTIAL/PLATELET
Basophils Absolute: 0.1 10*3/uL (ref 0.0–0.1)
Basophils Relative: 1 % (ref 0.0–3.0)
Eosinophils Absolute: 0.7 10*3/uL (ref 0.0–0.7)
Eosinophils Relative: 9.7 % — ABNORMAL HIGH (ref 0.0–5.0)
HCT: 32.8 % — ABNORMAL LOW (ref 39.0–52.0)
Hemoglobin: 11.5 g/dL — ABNORMAL LOW (ref 13.0–17.0)
Lymphocytes Relative: 26.4 % (ref 12.0–46.0)
Lymphs Abs: 2 10*3/uL (ref 0.7–4.0)
MCHC: 35.1 g/dL (ref 30.0–36.0)
MCV: 108.7 fL — ABNORMAL HIGH (ref 78.0–100.0)
Monocytes Absolute: 1.1 10*3/uL — ABNORMAL HIGH (ref 0.1–1.0)
Monocytes Relative: 14.6 % — ABNORMAL HIGH (ref 3.0–12.0)
Neutro Abs: 3.7 10*3/uL (ref 1.4–7.7)
Neutrophils Relative %: 48.3 % (ref 43.0–77.0)
Platelets: 83 10*3/uL — ABNORMAL LOW (ref 150.0–400.0)
RBC: 3.02 Mil/uL — ABNORMAL LOW (ref 4.22–5.81)
RDW: 16.5 % — ABNORMAL HIGH (ref 11.5–15.5)
WBC: 7.7 10*3/uL (ref 4.0–10.5)

## 2023-11-25 LAB — BASIC METABOLIC PANEL
BUN: 7 mg/dL (ref 6–23)
CO2: 27 meq/L (ref 19–32)
Calcium: 8.2 mg/dL — ABNORMAL LOW (ref 8.4–10.5)
Chloride: 100 meq/L (ref 96–112)
Creatinine, Ser: 1.15 mg/dL (ref 0.40–1.50)
GFR: 71.5 mL/min (ref >60.00)
Glucose, Bld: 126 mg/dL — ABNORMAL HIGH (ref 70–99)
Potassium: 3.3 meq/L — ABNORMAL LOW (ref 3.5–5.1)
Sodium: 134 meq/L — ABNORMAL LOW (ref 135–145)

## 2023-11-25 LAB — HEPATIC FUNCTION PANEL
ALT: 18 U/L (ref 0–53)
AST: 55 U/L — ABNORMAL HIGH (ref 0–37)
Albumin: 2.7 g/dL — ABNORMAL LOW (ref 3.5–5.2)
Alkaline Phosphatase: 280 U/L — ABNORMAL HIGH (ref 39–117)
Bilirubin, Direct: 4.3 mg/dL — ABNORMAL HIGH (ref 0.0–0.3)
Total Bilirubin: 10.4 mg/dL — ABNORMAL HIGH (ref 0.2–1.2)
Total Protein: 6.6 g/dL (ref 6.0–8.3)

## 2023-11-25 MED ORDER — PHYTONADIONE 5 MG PO TABS: 10.0000 mg | ORAL_TABLET | Freq: Every day | ORAL | 0 refills | Status: DC

## 2023-11-25 MED ORDER — POTASSIUM CHLORIDE CRYS ER 20 MEQ PO TBCR: 20.0000 meq | EXTENDED_RELEASE_TABLET | Freq: Every day | ORAL | 0 refills | Status: DC

## 2023-11-26 ENCOUNTER — Other Ambulatory Visit (HOSPITAL_COMMUNITY): Payer: Self-pay | Admitting: Nurse Practitioner

## 2023-11-26 ENCOUNTER — Other Ambulatory Visit (HOSPITAL_COMMUNITY): Payer: Self-pay

## 2023-11-26 ENCOUNTER — Other Ambulatory Visit: Payer: Self-pay | Admitting: Nurse Practitioner

## 2023-11-26 ENCOUNTER — Other Ambulatory Visit: Payer: Self-pay

## 2023-11-26 DIAGNOSIS — K7031 Alcoholic cirrhosis of liver with ascites: Secondary | ICD-10-CM

## 2023-11-26 DIAGNOSIS — R188 Other ascites: Secondary | ICD-10-CM

## 2023-11-26 MED ORDER — POTASSIUM CHLORIDE CRYS ER 20 MEQ PO TBCR
20.0000 meq | EXTENDED_RELEASE_TABLET | Freq: Every day | ORAL | 0 refills | Status: DC
  Filled 2023-11-26 – 2023-11-30 (×2): qty 10, 10d supply, fill #0

## 2023-11-26 MED ORDER — PHYTONADIONE 5 MG PO TABS
10.0000 mg | ORAL_TABLET | Freq: Every day | ORAL | 0 refills | Status: AC
  Filled 2023-11-26: qty 20, 10d supply, fill #0

## 2023-11-30 ENCOUNTER — Other Ambulatory Visit (HOSPITAL_COMMUNITY): Payer: Self-pay

## 2023-12-01 ENCOUNTER — Ambulatory Visit (HOSPITAL_COMMUNITY)
Admission: RE | Admit: 2023-12-01 | Discharge: 2023-12-01 | Disposition: A | Discharge status: Home / Self Care | Payer: Medicaid Other | Source: Ambulatory Visit | Attending: Nurse Practitioner | Admitting: Nurse Practitioner

## 2023-12-01 DIAGNOSIS — R188 Other ascites: Secondary | ICD-10-CM

## 2023-12-01 DIAGNOSIS — K7031 Alcoholic cirrhosis of liver with ascites: Secondary | ICD-10-CM | POA: Insufficient documentation

## 2023-12-01 HISTORY — PX: IR PARACENTESIS: IMG2679

## 2023-12-01 MED ORDER — LIDOCAINE HCL 1 % IJ SOLN
INTRAMUSCULAR | Status: AC
  Filled 2023-12-01: qty 20

## 2023-12-01 MED ORDER — ALBUMIN HUMAN 25 % IV SOLN
25.0000 g | Freq: Once | INTRAVENOUS | Status: AC
  Administered 2023-12-01: 25 g via INTRAVENOUS

## 2023-12-01 MED ORDER — ALBUMIN HUMAN 25 % IV SOLN
INTRAVENOUS | Status: AC
  Filled 2023-12-01: qty 100

## 2023-12-01 MED ORDER — LIDOCAINE HCL 1 % IJ SOLN
20.0000 mL | Freq: Once | INTRAMUSCULAR | Status: AC
  Administered 2023-12-01: 19 mL via INTRADERMAL

## 2023-12-01 NOTE — Procedures (Signed)
PROCEDURE SUMMARY:  Successful US guided paracentesis from right lateral abdomen.  Yielded 3.8 liters of clear yellow fluid.  No immediate complications.  Patient tolerated well.  EBL = trace  Tylor Courtwright S Jarah Pember PA-C 12/01/2023 4:22 PM

## 2023-12-03 ENCOUNTER — Other Ambulatory Visit: Payer: Medicaid Other

## 2023-12-03 DIAGNOSIS — K7031 Alcoholic cirrhosis of liver with ascites: Secondary | ICD-10-CM

## 2023-12-03 LAB — COMPREHENSIVE METABOLIC PANEL
ALT: 19 U/L (ref 0–53)
AST: 58 U/L — ABNORMAL HIGH (ref 0–37)
Albumin: 2.6 g/dL — ABNORMAL LOW (ref 3.5–5.2)
Alkaline Phosphatase: 285 U/L — ABNORMAL HIGH (ref 39–117)
BUN: 7 mg/dL (ref 6–23)
CO2: 27 meq/L (ref 19–32)
Calcium: 8.2 mg/dL — ABNORMAL LOW (ref 8.4–10.5)
Chloride: 101 meq/L (ref 96–112)
Creatinine, Ser: 1.06 mg/dL (ref 0.40–1.50)
GFR: 78.84 mL/min (ref >60.00)
Glucose, Bld: 122 mg/dL — ABNORMAL HIGH (ref 70–99)
Potassium: 3.3 meq/L — ABNORMAL LOW (ref 3.5–5.1)
Sodium: 135 meq/L (ref 135–145)
Total Bilirubin: 9.1 mg/dL — ABNORMAL HIGH (ref 0.2–1.2)
Total Protein: 6.4 g/dL (ref 6.0–8.3)

## 2023-12-03 LAB — PROTIME-INR
INR: 2.2 {ratio} — ABNORMAL HIGH (ref 0.8–1.0)
Prothrombin Time: 22.8 s — ABNORMAL HIGH (ref 9.6–13.1)

## 2023-12-04 ENCOUNTER — Other Ambulatory Visit (HOSPITAL_COMMUNITY): Payer: Self-pay

## 2023-12-04 ENCOUNTER — Other Ambulatory Visit: Payer: Self-pay

## 2023-12-04 DIAGNOSIS — K7031 Alcoholic cirrhosis of liver with ascites: Secondary | ICD-10-CM

## 2023-12-04 DIAGNOSIS — K7030 Alcoholic cirrhosis of liver without ascites: Secondary | ICD-10-CM

## 2023-12-04 DIAGNOSIS — E876 Hypokalemia: Secondary | ICD-10-CM

## 2023-12-04 MED ORDER — POTASSIUM CHLORIDE CRYS ER 20 MEQ PO TBCR
20.0000 meq | EXTENDED_RELEASE_TABLET | Freq: Every day | ORAL | 0 refills | Status: DC
  Filled 2023-12-04: qty 30, 30d supply, fill #0

## 2023-12-04 MED ORDER — POTASSIUM CHLORIDE CRYS ER 20 MEQ PO TBCR
20.0000 meq | EXTENDED_RELEASE_TABLET | Freq: Once | ORAL | 0 refills | Status: DC
  Filled 2023-12-04: qty 30, 30d supply, fill #0

## 2023-12-04 NOTE — Addendum Note (Signed)
Addended by: Sharyon Medicus H on: 12/04/2023 12:13 PM   Modules accepted: Orders

## 2023-12-17 ENCOUNTER — Other Ambulatory Visit (INDEPENDENT_AMBULATORY_CARE_PROVIDER_SITE_OTHER): Payer: Medicaid Other

## 2023-12-17 ENCOUNTER — Other Ambulatory Visit (HOSPITAL_COMMUNITY): Payer: Self-pay

## 2023-12-17 DIAGNOSIS — E876 Hypokalemia: Secondary | ICD-10-CM

## 2023-12-17 DIAGNOSIS — K7030 Alcoholic cirrhosis of liver without ascites: Secondary | ICD-10-CM | POA: Diagnosis not present

## 2023-12-17 LAB — COMPREHENSIVE METABOLIC PANEL WITH GFR
ALT: 20 U/L (ref 0–53)
AST: 59 U/L — ABNORMAL HIGH (ref 0–37)
Albumin: 2.7 g/dL — ABNORMAL LOW (ref 3.5–5.2)
Alkaline Phosphatase: 289 U/L — ABNORMAL HIGH (ref 39–117)
BUN: 7 mg/dL (ref 6–23)
CO2: 28 meq/L (ref 19–32)
Calcium: 8.3 mg/dL — ABNORMAL LOW (ref 8.4–10.5)
Chloride: 95 meq/L — ABNORMAL LOW (ref 96–112)
Creatinine, Ser: 1.01 mg/dL (ref 0.40–1.50)
GFR: 83.52 mL/min
Glucose, Bld: 123 mg/dL — ABNORMAL HIGH (ref 70–99)
Potassium: 3.1 meq/L — ABNORMAL LOW (ref 3.5–5.1)
Sodium: 133 meq/L — ABNORMAL LOW (ref 135–145)
Total Bilirubin: 9.6 mg/dL — ABNORMAL HIGH (ref 0.2–1.2)
Total Protein: 6.9 g/dL (ref 6.0–8.3)

## 2023-12-17 LAB — PROTIME-INR
INR: 2.3 {ratio} — ABNORMAL HIGH (ref 0.8–1.0)
Prothrombin Time: 23.2 s — ABNORMAL HIGH (ref 9.6–13.1)

## 2023-12-29 ENCOUNTER — Other Ambulatory Visit: Payer: Self-pay

## 2023-12-29 ENCOUNTER — Other Ambulatory Visit (HOSPITAL_COMMUNITY): Payer: Self-pay

## 2023-12-29 DIAGNOSIS — K7030 Alcoholic cirrhosis of liver without ascites: Secondary | ICD-10-CM

## 2023-12-29 DIAGNOSIS — K7031 Alcoholic cirrhosis of liver with ascites: Secondary | ICD-10-CM

## 2023-12-29 DIAGNOSIS — E876 Hypokalemia: Secondary | ICD-10-CM

## 2023-12-29 MED ORDER — FUROSEMIDE 40 MG PO TABS
40.0000 mg | ORAL_TABLET | Freq: Two times a day (BID) | ORAL | 1 refills | Status: DC
  Filled 2023-12-29: qty 60, 30d supply, fill #0

## 2023-12-29 MED ORDER — POTASSIUM CHLORIDE CRYS ER 20 MEQ PO TBCR
20.0000 meq | EXTENDED_RELEASE_TABLET | Freq: Two times a day (BID) | ORAL | 1 refills | Status: DC
  Filled 2023-12-29: qty 60, 30d supply, fill #0

## 2023-12-29 MED ORDER — EPLERENONE 25 MG PO TABS
50.0000 mg | ORAL_TABLET | Freq: Every day | ORAL | 1 refills | Status: DC
  Filled 2023-12-29: qty 60, 30d supply, fill #0

## 2023-12-29 MED ORDER — POTASSIUM CHLORIDE CRYS ER 20 MEQ PO TBCR
40.0000 meq | EXTENDED_RELEASE_TABLET | Freq: Every day | ORAL | 1 refills | Status: DC
  Filled 2023-12-29: qty 60, 30d supply, fill #0

## 2023-12-30 ENCOUNTER — Inpatient Hospital Stay: Admission: RE | Admit: 2023-12-30 | Payer: Medicaid Other | Source: Ambulatory Visit

## 2024-01-05 ENCOUNTER — Other Ambulatory Visit (INDEPENDENT_AMBULATORY_CARE_PROVIDER_SITE_OTHER): Payer: Medicaid Other

## 2024-01-05 DIAGNOSIS — K7030 Alcoholic cirrhosis of liver without ascites: Secondary | ICD-10-CM

## 2024-01-05 DIAGNOSIS — E876 Hypokalemia: Secondary | ICD-10-CM | POA: Diagnosis not present

## 2024-01-05 DIAGNOSIS — K7031 Alcoholic cirrhosis of liver with ascites: Secondary | ICD-10-CM | POA: Diagnosis not present

## 2024-01-05 LAB — BASIC METABOLIC PANEL
BUN: 8 mg/dL (ref 6–23)
CO2: 26 meq/L (ref 19–32)
Calcium: 8.7 mg/dL (ref 8.4–10.5)
Chloride: 99 meq/L (ref 96–112)
Creatinine, Ser: 1.19 mg/dL (ref 0.40–1.50)
GFR: 68.57 mL/min (ref >60.00)
Glucose, Bld: 114 mg/dL — ABNORMAL HIGH (ref 70–99)
Potassium: 3.7 meq/L (ref 3.5–5.1)
Sodium: 134 meq/L — ABNORMAL LOW (ref 135–145)

## 2024-01-11 ENCOUNTER — Ambulatory Visit
Admission: RE | Admit: 2024-01-11 | Discharge: 2024-01-11 | Disposition: A | Discharge status: Home / Self Care | Payer: Medicaid Other | Source: Ambulatory Visit | Attending: Nurse Practitioner

## 2024-01-11 DIAGNOSIS — R188 Other ascites: Secondary | ICD-10-CM

## 2024-01-11 DIAGNOSIS — K7031 Alcoholic cirrhosis of liver with ascites: Secondary | ICD-10-CM

## 2024-01-19 ENCOUNTER — Emergency Department (HOSPITAL_COMMUNITY)
Admission: EM | Admit: 2024-01-19 | Discharge: 2024-01-20 | Disposition: A | Discharge status: Other Acute Inpatient Hospital | Payer: Medicaid Other | Attending: Emergency Medicine | Admitting: Emergency Medicine

## 2024-01-19 ENCOUNTER — Other Ambulatory Visit: Payer: Self-pay

## 2024-01-19 ENCOUNTER — Encounter (HOSPITAL_COMMUNITY): Payer: Self-pay | Admitting: Emergency Medicine

## 2024-01-19 DIAGNOSIS — R112 Nausea with vomiting, unspecified: Secondary | ICD-10-CM | POA: Diagnosis present

## 2024-01-19 DIAGNOSIS — K7200 Acute and subacute hepatic failure without coma: Secondary | ICD-10-CM | POA: Diagnosis not present

## 2024-01-19 LAB — CBC WITH DIFFERENTIAL/PLATELET
Abs Immature Granulocytes: 0.03 10*3/uL (ref 0.00–0.07)
Basophils Absolute: 0.1 10*3/uL (ref 0.0–0.1)
Basophils Relative: 1 %
Eosinophils Absolute: 0.1 10*3/uL (ref 0.0–0.5)
Eosinophils Relative: 1 %
HCT: 31.3 % — ABNORMAL LOW (ref 39.0–52.0)
Hemoglobin: 11.1 g/dL — ABNORMAL LOW (ref 13.0–17.0)
Immature Granulocytes: 0 %
Lymphocytes Relative: 24 %
Lymphs Abs: 1.8 10*3/uL (ref 0.7–4.0)
MCH: 37.1 pg — ABNORMAL HIGH (ref 26.0–34.0)
MCHC: 35.5 g/dL (ref 30.0–36.0)
MCV: 104.7 fL — ABNORMAL HIGH (ref 80.0–100.0)
Monocytes Absolute: 1.1 10*3/uL — ABNORMAL HIGH (ref 0.1–1.0)
Monocytes Relative: 15 %
Neutro Abs: 4.3 10*3/uL (ref 1.7–7.7)
Neutrophils Relative %: 59 %
Platelets: 67 10*3/uL — ABNORMAL LOW (ref 150–400)
RBC: 2.99 MIL/uL — ABNORMAL LOW (ref 4.22–5.81)
RDW: 18.1 % — ABNORMAL HIGH (ref 11.5–15.5)
WBC: 7.3 10*3/uL (ref 4.0–10.5)
nRBC: 0 % (ref 0.0–0.2)

## 2024-01-19 LAB — COMPREHENSIVE METABOLIC PANEL
ALT: 25 U/L (ref 0–44)
AST: 63 U/L — ABNORMAL HIGH (ref 15–41)
Albumin: 2.2 g/dL — ABNORMAL LOW (ref 3.5–5.0)
Alkaline Phosphatase: 227 U/L — ABNORMAL HIGH (ref 38–126)
Anion gap: 16 — ABNORMAL HIGH (ref 5–15)
BUN: 9 mg/dL (ref 6–20)
CO2: 21 mmol/L — ABNORMAL LOW (ref 22–32)
Calcium: 9.2 mg/dL (ref 8.9–10.3)
Chloride: 97 mmol/L — ABNORMAL LOW (ref 98–111)
Creatinine, Ser: 1.4 mg/dL — ABNORMAL HIGH (ref 0.61–1.24)
GFR, Estimated: 59 mL/min — ABNORMAL LOW (ref >60)
Glucose, Bld: 103 mg/dL — ABNORMAL HIGH (ref 70–99)
Potassium: 4.1 mmol/L (ref 3.5–5.1)
Sodium: 134 mmol/L — ABNORMAL LOW (ref 135–145)
Total Bilirubin: 9.9 mg/dL — ABNORMAL HIGH (ref 0.0–1.2)
Total Protein: 7.2 g/dL (ref 6.5–8.1)

## 2024-01-19 LAB — PROTIME-INR
INR: 2.4 — ABNORMAL HIGH (ref 0.8–1.2)
Prothrombin Time: 26.3 s — ABNORMAL HIGH (ref 11.4–15.2)

## 2024-01-19 LAB — AMMONIA: Ammonia: 44 umol/L — ABNORMAL HIGH (ref 9–35)

## 2024-01-19 LAB — LIPASE, BLOOD: Lipase: 48 U/L (ref 11–51)

## 2024-01-19 MED ORDER — SODIUM CHLORIDE 0.9 % IV BOLUS
1000.0000 mL | Freq: Once | INTRAVENOUS | Status: AC
Start: 2024-01-19 — End: 2024-01-19
  Administered 2024-01-19: 1000 mL via INTRAVENOUS

## 2024-01-19 MED ORDER — LORAZEPAM 1 MG PO TABS
1.0000 mg | ORAL_TABLET | Freq: Once | ORAL | Status: AC
  Administered 2024-01-19: 1 mg via ORAL
  Filled 2024-01-19: qty 1

## 2024-01-19 MED ORDER — SODIUM CHLORIDE 0.9 % IV BOLUS
500.0000 mL | Freq: Once | INTRAVENOUS | Status: AC
  Administered 2024-01-19: 500 mL via INTRAVENOUS

## 2024-01-19 MED ORDER — ONDANSETRON HCL 4 MG/2ML IJ SOLN
4.0000 mg | Freq: Once | INTRAMUSCULAR | Status: AC
  Administered 2024-01-19: 4 mg via INTRAVENOUS
  Filled 2024-01-19: qty 2

## 2024-01-19 NOTE — ED Triage Notes (Signed)
Pt BIB EMS from home for nausea, vomiting, diarrhea since 0730 this morning. Unable to keep anything down today. Cirrhosis, jaundice in color, abd swelling noted with generalized abd pain.  EMS VS: CBG 108 HR 62 98% RA

## 2024-01-19 NOTE — ED Provider Notes (Signed)
 [place] EMERGENCY DEPARTMENT AT [facility] Provider Note   CSN: [id number] Arrival date & time: 01/19/24  1832     History {Add pertinent medical, surgical, social history, OB history to HPI:1} Chief Complaint  Patient presents with   Nausea    Nathaniel Warren is a 58 y.o. male.  HPI Patient has a history of anxiety stroke alcohol use disorder cirrhosis depression  Patient has a history of alcoholic cirrhosis.  He has abstained from alcohol now for 2 years.  Patient is followed by the transplant team at [facility] in [place].  Patient states today he started developing vomiting and diarrhea.  Patient states he has had some mild abdominal swelling but no tenderness.  Has had multiple episode of vomiting and diarrhea as well as some intermittent abdominal cramping but no blood in his emesis or his stool.  Patient has felt somewhat lightheaded.  No fevers.  Patient called his doctors and was directed to be seen but he needed to be transferred to [facility].  Pt states he is anxious about being at the hospital.  He take po ativan prn.  Requests a  dose here  Home Medications Prior to Admission medications   Medication Sig Start Date End Date Taking? Authorizing Provider  cyclobenzaprine (FLEXERIL) 5 MG tablet Take 1 tablet (5 mg total) by mouth 3 (three) times daily as needed for muscle spasms. 06/26/23   Ivonne Andrew, NP  eplerenone (INSPRA) 25 MG tablet Take 2 tablets (50 mg total) by mouth daily. 12/29/23   Arnaldo Natal, NP  furosemide (LASIX) 40 MG tablet Take 1 tablet (40 mg total) by mouth daily. 05/27/23   Mansouraty, Netty Starring., MD  furosemide (LASIX) 40 MG tablet Take 1 tablet (40 mg total) by mouth 2 (two) times daily. 12/29/23   Arnaldo Natal, NP  nadolol (CORGARD) 20 MG tablet Take 0.5 tablets (10 mg total) by mouth daily. 07/10/23   Mansouraty, Netty Starring., MD  ondansetron (ZOFRAN) 4 MG tablet Take 1 tablet (4 mg total) by  mouth every 8 (eight) hours as needed for nausea or vomiting. 05/27/23   Mansouraty, Netty Starring., MD  ondansetron (ZOFRAN-ODT) 4 MG disintegrating tablet Take 1 tablet (4 mg total) by mouth every 6 (six) hours as needed for vomiting. 10/26/23   Arnaldo Natal, NP  oxyCODONE (ROXICODONE) 5 MG immediate release tablet Take 1 tablet (5 mg total) by mouth every 4 (four) hours as needed for severe pain. 06/26/23   Ivonne Andrew, NP  PARoxetine (PAXIL) 20 MG tablet Take 1 tablet (20 mg total) by mouth daily. FURTHER REFILLS TO COME FROM PCP Patient taking differently: Take 20 mg by mouth daily. 05/27/23 11/18/23  Mansouraty, Netty Starring., MD  potassium chloride SA (KLOR-CON M) 20 MEQ tablet Take 2 tablets (40 mEq total) by mouth daily. 12/29/23   Arnaldo Natal, NP  QUEtiapine (SEROQUEL) 25 MG tablet TAKE 1 TABLET BY MOUTH AT BEDTIME NEED  OFFICE  VISIT Patient taking differently: Take 25 mg by mouth at bedtime. 07/20/23   Ivonne Andrew, NP  QUEtiapine (SEROQUEL) 25 MG tablet Take 1 tablet (25 mg total) by mouth at bedtime. 11/20/23   Ivonne Andrew, NP  spironolactone (ALDACTONE) 50 MG tablet Take 1 tablet (50 mg total) by mouth in the morning. 05/27/23   Mansouraty, Netty Starring., MD      Allergies    Nsaids    Review of Systems   Review of Systems  Physical Exam Updated Vital Signs BP 97/68   Pulse 82   Temp 98.3 F (36.8 C) (Oral)   Resp 20   Ht 1.753 m (5\' 9" )   Wt 88.5 kg   SpO2 94%   BMI 28.80 kg/m  Physical Exam Vitals and nursing note reviewed.  Constitutional:      Appearance: He is well-developed. He is ill-appearing.  HENT:     Head: Normocephalic and atraumatic.     Right Ear: External ear normal.     Left Ear: External ear normal.  Eyes:     General: Scleral icterus present.        Right eye: No discharge.        Left eye: No discharge.     Conjunctiva/sclera: Conjunctivae normal.  Neck:     Trachea: No tracheal deviation.  Cardiovascular:      Rate and Rhythm: Normal rate and regular rhythm.  Pulmonary:     Effort: Pulmonary effort is normal. No respiratory distress.     Breath sounds: Normal breath sounds. No stridor. No wheezing or rales.  Abdominal:     General: Bowel sounds are normal.     Palpations: Abdomen is soft.     Tenderness: There is no abdominal tenderness. There is no guarding or rebound.  Musculoskeletal:        General: No tenderness or deformity.     Cervical back: Neck supple.  Skin:    General: Skin is warm and dry.     Coloration: Skin is jaundiced.     Findings: No rash.  Neurological:     General: No focal deficit present.     Mental Status: He is alert.     Cranial Nerves: No cranial nerve deficit, dysarthria or facial asymmetry.     Sensory: No sensory deficit.     Motor: No abnormal muscle tone or seizure activity.     Coordination: Coordination normal.  Psychiatric:        Mood and Affect: Mood normal.     ED Results / Procedures / Treatments   Labs (all labs ordered are listed, but only abnormal results are displayed) Labs Reviewed  COMPREHENSIVE METABOLIC PANEL - Abnormal; Notable for the following components:      Result Value   Sodium 134 (*)    Chloride 97 (*)    CO2 21 (*)    Glucose, Bld 103 (*)    Creatinine, Ser 1.40 (*)    Albumin 2.2 (*)    AST 63 (*)    Alkaline Phosphatase 227 (*)    Total Bilirubin 9.9 (*)    GFR, Estimated 59 (*)    Anion gap 16 (*)    All other components within normal limits  CBC WITH DIFFERENTIAL/PLATELET - Abnormal; Notable for the following components:   RBC 2.99 (*)    Hemoglobin 11.1 (*)    HCT 31.3 (*)    MCV 104.7 (*)    MCH 37.1 (*)    RDW 18.1 (*)    Platelets 67 (*)    Monocytes Absolute 1.1 (*)    All other components within normal limits  PROTIME-INR - Abnormal; Notable for the following components:   Prothrombin Time 26.3 (*)    INR 2.4 (*)    All other components within normal limits  AMMONIA - Abnormal; Notable for the  following components:   Ammonia 44 (*)    All other components within normal limits  LIPASE, BLOOD  URINALYSIS, ROUTINE W REFLEX MICROSCOPIC  EKG None  Radiology No results found.  Procedures Procedures  {Document cardiac monitor, telemetry assessment procedure when appropriate:1}  Medications Ordered in ED Medications  sodium chloride 0.9 % bolus 1,000 mL (0 mLs Intravenous Stopped 01/19/24 2046)  ondansetron (ZOFRAN) injection 4 mg (4 mg Intravenous Given 01/19/24 1916)  LORazepam (ATIVAN) tablet 1 mg (1 mg Oral Given 01/19/24 1917)  sodium chloride 0.9 % bolus 500 mL (0 mLs Intravenous Stopped 01/19/24 2301)    ED Course/ Medical Decision Making/ A&P Clinical Course as of 01/19/24 2345  Tue Jan 19, 2024  2102 Ammonia(!) Ammonia levels increased at 44.  Patient's white count is not elevated.  Hemoglobin stable at 11. [JK]  2102 Metabolic panel shows elevated bilirubin at 9.9.  Creatinine increased at 1.40 [JK]  2115 Patient states he has had a couple episodes of vomiting and diarrhea since has been here but is feeling better [JK]  2116 Blood pressure still remains soft.  Will give an additional fluid bolus [JK]  2253 [facility] to [facility]   Dr Edward Qualia [JK]  2335 Case discussed with Dr Joycelyn Das.  Accepts pt for transfer  [JK]    Clinical Course User Index [JK] Linwood Dibbles, MD   {   Click here for ABCD2, HEART and other calculatorsREFRESH Note before signing :1}                              Medical Decision Making Amount and/or Complexity of Data Reviewed Labs: ordered. Decision-making details documented in ED Course.  Risk Prescription drug management.   ***  {Document critical care time when appropriate:1} {Document review of labs and clinical decision tools ie heart score, Chads2Vasc2 etc:1}  {Document your independent review of radiology images, and any outside records:1} {Document your discussion with family members, caretakers, and with consultants:1} {Document  social determinants of health affecting pt's care:1} {Document your decision making why or why not admission, treatments were needed:1} Final Clinical Impression(s) / ED Diagnoses Final diagnoses:  Acute liver failure without hepatic coma    Rx / DC Orders ED Discharge Orders     None

## 2024-01-20 NOTE — ED Notes (Signed)
[facility] contacted Pt to be transported via [facility], Truck is en route.  ED to be called for report. [phone]

## 2024-01-20 NOTE — ED Notes (Signed)
Report called to [facility]

## 2024-01-20 NOTE — ED Notes (Signed)
Transport for patient called stating they will arrive in 40 mins

## 2024-01-20 NOTE — ED Provider Notes (Signed)
I assumed care of the patient at 0 700.  Briefly the patient has been boarding in the waiting transfer to [facility] in [place].  Patient is status post liver transplant has had nausea and vomiting and has had worsening of his liver function test and INR.  He has already been accepted but unfortunately due to boarding has not yet received a bed.  We had called earlier and there was no update on when the patient would get a bed.  I called the emergency department at [facility] and they to accept the patient in ED to ED transfer.  Accepting Gerome Sam MD.    Melene Plan, DO 01/20/24 1407

## 2024-01-22 ENCOUNTER — Other Ambulatory Visit (HOSPITAL_COMMUNITY): Payer: Self-pay

## 2024-01-22 MED ORDER — CARVEDILOL 3.125 MG PO TABS
ORAL_TABLET | ORAL | 0 refills | Status: DC
  Filled 2024-01-22: qty 90, 90d supply, fill #0

## 2024-01-22 MED ORDER — PAROXETINE HCL 10 MG PO TABS
10.0000 mg | ORAL_TABLET | Freq: Every day | ORAL | 0 refills | Status: DC
  Filled 2024-01-22: qty 90, 90d supply, fill #0

## 2024-01-22 MED ORDER — NICOTINE 14 MG/24HR TD PT24
MEDICATED_PATCH | TRANSDERMAL | 0 refills | Status: DC
  Filled 2024-01-22: qty 14, 14d supply, fill #0

## 2024-01-22 MED ORDER — QUETIAPINE FUMARATE 25 MG PO TABS
ORAL_TABLET | ORAL | 0 refills | Status: DC
Start: 2024-01-22 — End: 2024-03-24
  Filled 2024-01-22: qty 90, 90d supply, fill #0

## 2024-01-29 ENCOUNTER — Other Ambulatory Visit (HOSPITAL_COMMUNITY): Payer: Self-pay

## 2024-02-02 ENCOUNTER — Other Ambulatory Visit: Payer: Self-pay | Admitting: Nurse Practitioner

## 2024-02-02 ENCOUNTER — Other Ambulatory Visit (HOSPITAL_COMMUNITY): Payer: Self-pay

## 2024-02-02 DIAGNOSIS — R2232 Localized swelling, mass and lump, left upper limb: Secondary | ICD-10-CM

## 2024-02-02 MED ORDER — FUROSEMIDE 40 MG PO TABS
80.0000 mg | ORAL_TABLET | Freq: Every day | ORAL | 3 refills | Status: DC
Start: 2024-02-02 — End: 2024-03-24
  Filled 2024-02-02: qty 180, 90d supply, fill #0

## 2024-02-02 MED ORDER — EPLERENONE 25 MG PO TABS
50.0000 mg | ORAL_TABLET | Freq: Every day | ORAL | 3 refills | Status: DC
  Filled 2024-02-02: qty 180, 90d supply, fill #0

## 2024-02-03 ENCOUNTER — Other Ambulatory Visit: Payer: Self-pay

## 2024-02-04 ENCOUNTER — Emergency Department (HOSPITAL_COMMUNITY)
Admission: EM | Admit: 2024-02-04 | Discharge: 2024-02-05 | Disposition: A | Discharge status: Home / Self Care | Payer: Medicaid Other | Attending: Emergency Medicine | Admitting: Emergency Medicine

## 2024-02-04 ENCOUNTER — Emergency Department (HOSPITAL_COMMUNITY): Payer: Medicaid Other

## 2024-02-04 DIAGNOSIS — R58 Hemorrhage, not elsewhere classified: Secondary | ICD-10-CM

## 2024-02-04 DIAGNOSIS — M7989 Other specified soft tissue disorders: Secondary | ICD-10-CM

## 2024-02-04 DIAGNOSIS — R0602 Shortness of breath: Secondary | ICD-10-CM | POA: Insufficient documentation

## 2024-02-04 DIAGNOSIS — S40022A Contusion of left upper arm, initial encounter: Secondary | ICD-10-CM | POA: Diagnosis not present

## 2024-02-04 DIAGNOSIS — X58XXXA Exposure to other specified factors, initial encounter: Secondary | ICD-10-CM | POA: Diagnosis not present

## 2024-02-04 DIAGNOSIS — Z720 Tobacco use: Secondary | ICD-10-CM | POA: Diagnosis not present

## 2024-02-04 DIAGNOSIS — Z8673 Personal history of transient ischemic attack (TIA), and cerebral infarction without residual deficits: Secondary | ICD-10-CM | POA: Insufficient documentation

## 2024-02-04 LAB — COMPREHENSIVE METABOLIC PANEL
ALT: 22 U/L (ref 0–44)
AST: 61 U/L — ABNORMAL HIGH (ref 15–41)
Albumin: 2.5 g/dL — ABNORMAL LOW (ref 3.5–5.0)
Alkaline Phosphatase: 255 U/L — ABNORMAL HIGH (ref 38–126)
Anion gap: 11 (ref 5–15)
BUN: 9 mg/dL (ref 6–20)
CO2: 23 mmol/L (ref 22–32)
Calcium: 8.4 mg/dL — ABNORMAL LOW (ref 8.9–10.3)
Chloride: 100 mmol/L (ref 98–111)
Creatinine, Ser: 1.08 mg/dL (ref 0.61–1.24)
GFR, Estimated: >60 mL/min (ref >60)
Glucose, Bld: 97 mg/dL (ref 70–99)
Potassium: 3.4 mmol/L — ABNORMAL LOW (ref 3.5–5.1)
Sodium: 134 mmol/L — ABNORMAL LOW (ref 135–145)
Total Bilirubin: 9.9 mg/dL — ABNORMAL HIGH (ref 0.0–1.2)
Total Protein: 7 g/dL (ref 6.5–8.1)

## 2024-02-04 LAB — CBC
HCT: 29.3 % — ABNORMAL LOW (ref 39.0–52.0)
Hemoglobin: 10.4 g/dL — ABNORMAL LOW (ref 13.0–17.0)
MCH: 38.1 pg — ABNORMAL HIGH (ref 26.0–34.0)
MCHC: 35.5 g/dL (ref 30.0–36.0)
MCV: 107.3 fL — ABNORMAL HIGH (ref 80.0–100.0)
Platelets: 83 10*3/uL — ABNORMAL LOW (ref 150–400)
RBC: 2.73 MIL/uL — ABNORMAL LOW (ref 4.22–5.81)
RDW: 18.1 % — ABNORMAL HIGH (ref 11.5–15.5)
WBC: 8.3 10*3/uL (ref 4.0–10.5)
nRBC: 0 % (ref 0.0–0.2)

## 2024-02-04 LAB — BRAIN NATRIURETIC PEPTIDE: B Natriuretic Peptide: 237.4 pg/mL — ABNORMAL HIGH (ref 0.0–100.0)

## 2024-02-04 LAB — TROPONIN I (HIGH SENSITIVITY): Troponin I (High Sensitivity): 6 ng/L (ref <18)

## 2024-02-04 LAB — PROTIME-INR
INR: 2.4 — ABNORMAL HIGH (ref 0.8–1.2)
Prothrombin Time: 26.4 s — ABNORMAL HIGH (ref 11.4–15.2)

## 2024-02-04 NOTE — ED Triage Notes (Signed)
 Left arm swelling, pain-  missed appt 2 days ago for ultrasound. PCP called and told pt to come to ER  to r/o DVT. Pt has hx of same in left arm. Pt also c/o SHOB. Hx of cirrhosis.

## 2024-02-04 NOTE — ED Provider Notes (Signed)
 [place] EMERGENCY DEPARTMENT AT [facility] Provider Note   CSN: [id number] Arrival date & time: 02/04/24  1850     History {Add pertinent medical, surgical, social history, OB history to HPI:1} Chief Complaint  Patient presents with   Left arm swelling    Nathaniel Warren is a 58 y.o. male.  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  He was found to have a portal vein thrombosis and they had discussed possible anticoagulation but felt that he was too high risk in the setting of known gastric varices and was not started on anything at that time.  He was started on carvedilol  Saw liver Dr. Tuesday, arm swelling started 5 days ago, slowly getting worse.  Dr. Javier Docker to do DVT US but slept through appointment  Shortness of breath, since last night, not sure if worse laying down No chest pain Nerve damage and leg pain with that, no new pain or swelling  No fever or cough No n/v/black or bloody stools   Past Medical History:  Diagnosis Date   Cirrhosis (HCC)    Depression    Drug overdose 10/20/2017   Drug overdose, intentional self-harm, initial encounter (HCC) 10/20/2017   Drug overdose, intentional, initial encounter (HCC) 10/20/2017   Hernia, abdominal    Liver failure (HCC)    Seizures (HCC)    Stroke (HCC)    Tobacco abuse      Home Medications Prior to Admission medications   Medication Sig Start Date End Date Taking? Authorizing Provider  carvedilol (COREG) 3.125 MG tablet Take 1 tablet (3.125 mg total) by mouth nightly. 01/22/24     cyclobenzaprine (FLEXERIL) 5 MG tablet Take 1 tablet (5 mg total) by mouth 3 (three) times daily as needed for muscle spasms. 06/26/23   Ivonne Andrew, NP   eplerenone (INSPRA) 25 MG tablet Take 2 tablets (50 mg total) by mouth daily. 12/29/23   Arnaldo Natal, NP  eplerenone (INSPRA) 25 MG tablet Take 2 tablets (50 mg total) by mouth daily. 02/02/24     furosemide (LASIX) 40 MG tablet Take 1 tablet (40 mg total) by mouth daily. 05/27/23   Mansouraty, Netty Starring., MD  furosemide (LASIX) 40 MG tablet Take 1 tablet (40 mg total) by mouth 2 (two) times daily. 12/29/23   Arnaldo Natal, NP  furosemide (LASIX) 40 MG tablet Take 2 tablets (80 mg total) by mouth daily. 02/02/24     nadolol (CORGARD) 20 MG tablet Take 0.5 tablets (10 mg total) by mouth daily. 07/10/23   Mansouraty, Netty Starring., MD  nicotine (NICOTINE STEP 2) 14 mg/24hr patch Place 1 patch on the skin daily as needed (for nicotine craving if patient desires). 01/22/24     ondansetron (ZOFRAN) 4 MG tablet Take 1 tablet (4 mg total) by mouth every 8 (eight) hours as needed for nausea or vomiting. 05/27/23   Mansouraty, Netty Starring., MD  ondansetron (ZOFRAN-ODT) 4 MG disintegrating tablet Take 1 tablet (4 mg total) by mouth every 6 (six) hours as needed for vomiting. 10/26/23   Arnaldo Natal, NP  oxyCODONE (ROXICODONE) 5 MG immediate release tablet Take 1 tablet (5 mg total) by mouth every 4 (four) hours as needed for severe pain. 06/26/23   Ivonne Andrew,  NP  PARoxetine (PAXIL) 10 MG tablet Take 1 tablet (10 mg total) by mouth daily. 01/22/24     PARoxetine (PAXIL) 20 MG tablet Take 1 tablet (20 mg total) by mouth daily. FURTHER REFILLS TO COME FROM PCP Patient taking differently: Take 20 mg by mouth daily. 05/27/23 11/18/23  Mansouraty, Netty Starring., MD  potassium chloride SA (KLOR-CON M) 20 MEQ tablet Take 2 tablets (40 mEq total) by mouth daily. 12/29/23   Arnaldo Natal, NP  QUEtiapine (SEROQUEL) 25 MG tablet TAKE 1 TABLET BY MOUTH AT BEDTIME NEED  OFFICE  VISIT Patient taking differently: Take 25 mg by mouth at bedtime. 07/20/23   Ivonne Andrew, NP  QUEtiapine  (SEROQUEL) 25 MG tablet Take 1 tablet (25 mg total) by mouth at bedtime. 11/20/23   Ivonne Andrew, NP  QUEtiapine (SEROQUEL) 25 MG tablet Take 1 tablet (25 mg total) by mouth nightly as needed (For insomnia, anxiety). 01/22/24     spironolactone (ALDACTONE) 50 MG tablet Take 1 tablet (50 mg total) by mouth in the morning. 05/27/23   Mansouraty, Netty Starring., MD      Allergies    Nsaids    Review of Systems   Review of Systems  Physical Exam Updated Vital Signs BP 109/62 (BP Location: Right Arm)   Pulse (!) 56   Temp 98.5 F (36.9 C) (Oral)   Resp 16   SpO2 99%  Physical Exam  ED Results / Procedures / Treatments   Labs (all labs ordered are listed, but only abnormal results are displayed) Labs Reviewed  CBC  COMPREHENSIVE METABOLIC PANEL  BRAIN NATRIURETIC PEPTIDE  TROPONIN I (HIGH SENSITIVITY)    EKG None  Radiology No results found.  Procedures Procedures  {Document cardiac monitor, telemetry assessment procedure when appropriate:1}  Medications Ordered in ED Medications - No data to display  ED Course/ Medical Decision Making/ A&P   {   Click here for ABCD2, HEART and other calculatorsREFRESH Note before signing :1}                              Medical Decision Making Amount and/or Complexity of Data Reviewed Labs: ordered.   ***  {Document critical care time when appropriate:1} {Document review of labs and clinical decision tools ie heart score, Chads2Vasc2 etc:1}  {Document your independent review of radiology images, and any outside records:1} {Document your discussion with family members, caretakers, and with consultants:1} {Document social determinants of health affecting pt's care:1} {Document your decision making why or why not admission, treatments were needed:1} Final Clinical Impression(s) / ED Diagnoses Final diagnoses:  None    Rx / DC Orders ED Discharge Orders     None

## 2024-02-05 ENCOUNTER — Emergency Department (HOSPITAL_COMMUNITY): Payer: Medicaid Other

## 2024-02-05 ENCOUNTER — Other Ambulatory Visit (HOSPITAL_COMMUNITY): Payer: Self-pay

## 2024-02-05 DIAGNOSIS — M7989 Other specified soft tissue disorders: Secondary | ICD-10-CM | POA: Diagnosis not present

## 2024-02-05 LAB — TROPONIN I (HIGH SENSITIVITY): Troponin I (High Sensitivity): 6 ng/L (ref <18)

## 2024-02-05 MED ORDER — IOHEXOL 350 MG/ML SOLN
100.0000 mL | Freq: Once | INTRAVENOUS | Status: AC | PRN
  Administered 2024-02-05: 100 mL via INTRAVENOUS

## 2024-02-05 MED ORDER — OXYCODONE HCL 5 MG PO TABS
5.0000 mg | ORAL_TABLET | Freq: Once | ORAL | Status: AC
  Administered 2024-02-05: 5 mg via ORAL
  Filled 2024-02-05: qty 1

## 2024-02-05 MED ORDER — OXYCODONE HCL 5 MG PO TABS
5.0000 mg | ORAL_TABLET | Freq: Three times a day (TID) | ORAL | 0 refills | Status: DC | PRN
  Filled 2024-02-05: qty 12, 4d supply, fill #0

## 2024-02-05 NOTE — ED Notes (Signed)
 Ultrasound at bedside

## 2024-02-05 NOTE — ED Provider Notes (Signed)
 58 yo male here with chronic liver disease, esophageal varices Pending DVT ultrasound of the arm due to swelling  CT PE study with small Left pleural effusion, known hx of abdominal ascites Labs near baseline including elevated Tbili level  Physical Exam  BP 101/73 (BP Location: Left Arm)   Pulse 69   Temp 98.5 F (36.9 C) (Oral)   Resp 18   SpO2 93%   Physical Exam  Procedures  Procedures  ED Course / MDM    Medical Decision Making Amount and/or Complexity of Data Reviewed Labs: ordered. Radiology: ordered.  Risk Prescription drug management.   DVT ultrasound is negative for acute thrombosis.  I reevaluated the patient and he has ecchymosis and swelling of his left arm, likely related to his liver disease and elevated INR, thin blood, may have been caused by a small tear bleed in the arm.  His compartment is soft.  He does not have neurological deficits to suggest acute compartment syndrome, or evidence of infection.  I recommended elevation of his arm at home, ice on and off, light compression bandage, and I will provide a short course of oxycodone pain medicine for breakthrough pain.  He is otherwise stable for discharge home at this time.  Patient is comfortable with this plan       Terald Sleeper, MD 02/05/24 [phone]

## 2024-02-05 NOTE — Discharge Instructions (Addendum)
 Your workup did not show signs of blood clot on your arms or blood clot on your lungs.  You are likely having pain and swelling due to a small bleed inside your left arm, which can cause swelling of the arm and discoloration.  Try to keep your arm elevated to shoulder level is much as possible when you are sitting down, by propping several pillows underneath.  This will help gravity drain some of the blood from your arm.  This process can take 4-6 weeks to completely resolve.

## 2024-02-05 NOTE — ED Notes (Signed)
 Patient's left arm was wrapped in kerlix and coband

## 2024-02-08 ENCOUNTER — Telehealth: Payer: Self-pay

## 2024-02-08 NOTE — Transitions of Care (Post Inpatient/ED Visit) (Unsigned)
 02/08/2024  Name: Nathaniel Warren MRN: [id number] DOB: Jul 03, 1966  Today's TOC FU Call Status:   Patient's Name and Date of Birth confirmed.  Transition Care Management Follow-up Telephone Call Date of Discharge: 02/05/24 Discharge Facility: [facility]) Type of Discharge: Emergency Department Reason for ED Visit: Orthopedic Conditions Orthopedic/Injury Diagnosis: Injury with Sutures/Staples How have you been since you were released from the hospital?: Better Any questions or concerns?: No  Items Reviewed: Did you receive and understand the discharge instructions provided?: Yes Medications obtained,verified, and reconciled?: Yes (Medications Reviewed) Any new allergies since your discharge?: No Dietary orders reviewed?: No Do you have support at home?: Yes People in Home: friend(s) Name of Support/Comfort Primary Source: Greig Castilla  Medications Reviewed Today: Medications Reviewed Today     Reviewed by Veneta Penton, CMA (Certified Medical Assistant) on 02/08/24 at [phone]  Med List Status: <None>   Medication Order Taking? Sig Documenting Provider Last Dose Status Informant  carvedilol (COREG) 3.125 MG tablet [id number] Yes Take 1 tablet (3.125 mg total) by mouth nightly.  Taking Active Self  cyclobenzaprine (FLEXERIL) 5 MG tablet [id number] No Take 1 tablet (5 mg total) by mouth 3 (three) times daily as needed for muscle spasms.  Patient not taking: Reported on 02/04/2024   Ivonne Andrew, NP Not Taking Active Self           Med Note Cyndie Chime, [facility] I   Thu Feb 04, 2024 10:11 PM)    eplerenone (INSPRA) 25 MG tablet [id number] Yes Take 2 tablets (50 mg total) by mouth daily. Arnaldo Natal, NP Taking Active Self  eplerenone (INSPRA) 25 MG tablet [id number]  Take 2 tablets (50 mg total) by mouth daily.   Active Self           Med Note Cyndie Chime, [facility] I   Thu Feb 04, 2024 10:07 PM) Duplicate  furosemide (LASIX) 40 MG tablet [id number] No Take 1 tablet (40 mg  total) by mouth daily.  Patient not taking: Reported on 02/04/2024   Mansouraty, Netty Starring., MD Not Taking Active Self  furosemide (LASIX) 40 MG tablet [id number] No Take 1 tablet (40 mg total) by mouth 2 (two) times daily.  Patient not taking: Reported on 02/04/2024   Arnaldo Natal, NP Not Taking Active Self  furosemide (LASIX) 40 MG tablet [id number] Yes Take 2 tablets (80 mg total) by mouth daily.  Taking Active Self  nadolol (CORGARD) 20 MG tablet [id number] No Take 0.5 tablets (10 mg total) by mouth daily.  Patient not taking: Reported on 02/04/2024   Mansouraty, Netty Starring., MD Not Taking Active Self           Med Note Ventura Bruns Oct 08, 2023  5:15 PM) PROVIDER: The patient said this Rx NEVER reached his pharmacy, so he has no access to it.  nicotine (NICOTINE STEP 2) 14 mg/24hr patch [id number] Yes Place 1 patch on the skin daily as needed (for nicotine craving if patient desires).  Taking Active Self  ondansetron (ZOFRAN) 4 MG tablet [id number] No Take 1 tablet (4 mg total) by mouth every 8 (eight) hours as needed for nausea or vomiting.  Patient not taking: Reported on 02/08/2024   Mansouraty, Netty Starring., MD Not Taking Active Self           Med Note Cyndie Chime, [facility] I   Thu Feb 04, 2024 10:09 PM) On Hold.  ondansetron (ZOFRAN-ODT) 4 MG disintegrating tablet [id number] No Take 1 tablet (4 mg  total) by mouth every 6 (six) hours as needed for vomiting.  Patient not taking: Reported on 02/04/2024   Arnaldo Natal, NP Not Taking Active Self  oxyCODONE (ROXICODONE) 5 MG immediate release tablet [id number] No Take 1 tablet (5 mg total) by mouth every 4 (four) hours as needed for severe pain.  Patient not taking: Reported on 02/04/2024   Ivonne Andrew, NP Not Taking Active Self  oxyCODONE (ROXICODONE) 5 MG immediate release tablet [id number] Yes Take 1 tablet (5 mg total) by mouth every 8 (eight) hours as needed for up to 12 doses for severe pain (pain score 7-10).  Terald Sleeper, MD Taking Active   PARoxetine (PAXIL) 10 MG tablet [id number] Yes Take 1 tablet (10 mg total) by mouth daily.  Taking Active Self  PARoxetine (PAXIL) 20 MG tablet [id number]  Take 1 tablet (20 mg total) by mouth daily. FURTHER REFILLS TO COME FROM PCP  Patient taking differently: Take 20 mg by mouth daily.   Mansouraty, Netty Starring., MD  Expired 11/18/23 2359 Self           Med Note Ventura Bruns Oct 08, 2023  5:07 PM) PROVIDER: The patient requests a NEW Rx, please  potassium chloride SA (KLOR-CON M) 20 MEQ tablet [id number] Yes Take 2 tablets (40 mEq total) by mouth daily.  Patient taking differently: Take 20 mEq by mouth daily.   Arnaldo Natal, NP Taking Active Self  QUEtiapine (SEROQUEL) 25 MG tablet [id number] No TAKE 1 TABLET BY MOUTH AT BEDTIME NEED  OFFICE  VISIT  Patient not taking: No sig reported   Ivonne Andrew, NP Not Taking Active Self           Med Note Antony Madura, Harlin Rain Oct 08, 2023  5:10 PM) PROVIDER: The patient requests a NEW Rx, please  QUEtiapine (SEROQUEL) 25 MG tablet [id number] No Take 1 tablet (25 mg total) by mouth at bedtime.  Patient not taking: Reported on 02/04/2024   Ivonne Andrew, NP Not Taking Active Self  QUEtiapine (SEROQUEL) 25 MG tablet [id number] Yes Take 1 tablet (25 mg total) by mouth nightly as needed (For insomnia, anxiety).  Taking Active Self  spironolactone (ALDACTONE) 50 MG tablet [id number] No Take 1 tablet (50 mg total) by mouth in the morning.  Patient not taking: Reported on 02/04/2024   Mansouraty, Netty Starring., MD Not Taking Active Self  traMADol (ULTRAM) 50 MG tablet [id number] Yes Take 50 mg by mouth every 6 (six) hours as needed for moderate pain (pain score 4-6). [provider] Taking Active Self            Home Care and Equipment/Supplies: Were Home Health Services Ordered?: No Any new equipment or medical supplies ordered?: No  Functional Questionnaire: Do you need assistance with  bathing/showering or dressing?: No Do you need assistance with meal preparation?: No Do you need assistance with eating?: No Do you have difficulty maintaining continence: No Do you need assistance with getting out of bed/getting out of a chair/moving?: No Do you have difficulty managing or taking your medications?: No  Follow up appointments reviewed: PCP Follow-up appointment confirmed?: Yes Date of PCP follow-up appointment?: 02/15/24 Specialist Hospital Follow-up appointment confirmed?: No Do you need transportation to your follow-up appointment?: Yes Do you understand care options if your condition(s) worsen?: Yes-patient verbalized understanding    SIGNATURE Salem Lembke, CMA

## 2024-02-15 ENCOUNTER — Ambulatory Visit: Payer: Self-pay | Admitting: Nurse Practitioner

## 2024-02-15 ENCOUNTER — Encounter: Payer: Self-pay | Admitting: Nurse Practitioner

## 2024-02-15 VITALS — BP 99/55 | HR 64 | Temp 98.2°F | Wt 199.6 lb

## 2024-02-15 DIAGNOSIS — K7290 Hepatic failure, unspecified without coma: Secondary | ICD-10-CM | POA: Diagnosis not present

## 2024-02-15 DIAGNOSIS — Z23 Encounter for immunization: Secondary | ICD-10-CM | POA: Diagnosis not present

## 2024-02-15 NOTE — Patient Instructions (Signed)
 1. Need for shingles vaccine  - Zoster, Recombinant (Shingrix)  2. Immunization due (Primary)  - Flu vaccine trivalent PF, 6mos and older(Flulaval,Afluria,Fluarix,Fluzone)

## 2024-02-15 NOTE — Progress Notes (Signed)
 Subjective   Patient ID: Nathaniel Warren, male    DOB: [birth year], 58 y.o.   MRN: [id number]  Chief Complaint  Patient presents with   Hospitalization Follow-up    Patient stated that his liver Dr. Catalina Pizza him to come get his Vaccines     Referring provider: Ivonne Andrew, NP  Nathaniel Warren is a 58 y.o. male with Past Medical History: No date: Cirrhosis (HCC) No date: Depression 10/20/2017: Drug overdose 10/20/2017: Drug overdose, intentional self-harm, initial encounter  (HCC) 10/20/2017: Drug overdose, intentional, initial encounter (HCC) No date: Hernia, abdominal No date: Liver failure (HCC) No date: Seizures (HCC) No date: Stroke [facility]) No date: Tobacco abuse  HPI  Patient presents today for a follow-up visit. He has not been here since last July. He has been following with [facility] liver specialist.  He is on the transplant list.  The liver specialist had advised him to come to her office to get caught up on vaccines.  Patient was seen in the ED on 02/04/24. States that he is much improved. He thinks he had a stomach virus. He had brusing on his arm and was negative for DVT.   Removed bandage from forearm today. Still has significant bruising noted to left forearm, but swelling is much improved.        Allergies  Allergen Reactions   Nsaids Other (See Comments)    Cannot take due to liver condition    Immunization History  Administered Date(s) Administered   Hepb-cpg 05/27/2023   Influenza Split 01/10/2013   Influenza, Seasonal, Injecte, Preservative Fre 02/15/2024   Influenza,inj,Quad PF,6+ Mos 03/20/2016, 10/25/2017   Pneumococcal Polysaccharide-23 10/25/2017   Zoster Recombinant(Shingrix) 02/15/2024    Tobacco History: Social History   Tobacco Use  Smoking Status Every Day   Current packs/day: 1.00   Average packs/day: 1 pack/day for 30.0 years (30.0 ttl pk-yrs)   Types: Cigarettes  Smokeless Tobacco Former   Ready to quit: Yes Counseling  given: Yes   Outpatient Encounter Medications as of 02/15/2024  Medication Sig   carvedilol (COREG) 3.125 MG tablet Take 1 tablet (3.125 mg total) by mouth nightly.   eplerenone (INSPRA) 25 MG tablet Take 2 tablets (50 mg total) by mouth daily.   furosemide (LASIX) 40 MG tablet Take 1 tablet (40 mg total) by mouth daily.   nicotine (NICOTINE STEP 2) 14 mg/24hr patch Place 1 patch on the skin daily as needed (for nicotine craving if patient desires).   PARoxetine (PAXIL) 10 MG tablet Take 1 tablet (10 mg total) by mouth daily.   QUEtiapine (SEROQUEL) 25 MG tablet TAKE 1 TABLET BY MOUTH AT BEDTIME NEED  OFFICE  VISIT   cyclobenzaprine (FLEXERIL) 5 MG tablet Take 1 tablet (5 mg total) by mouth 3 (three) times daily as needed for muscle spasms. (Patient not taking: Reported on 02/04/2024)   eplerenone (INSPRA) 25 MG tablet Take 2 tablets (50 mg total) by mouth daily.   furosemide (LASIX) 40 MG tablet Take 1 tablet (40 mg total) by mouth 2 (two) times daily. (Patient not taking: Reported on 02/04/2024)   furosemide (LASIX) 40 MG tablet Take 2 tablets (80 mg total) by mouth daily.   nadolol (CORGARD) 20 MG tablet Take 0.5 tablets (10 mg total) by mouth daily. (Patient not taking: Reported on 02/04/2024)   ondansetron (ZOFRAN) 4 MG tablet Take 1 tablet (4 mg total) by mouth every 8 (eight) hours as needed for nausea or vomiting. (Patient not taking: Reported on 02/15/2024)  ondansetron (ZOFRAN-ODT) 4 MG disintegrating tablet Take 1 tablet (4 mg total) by mouth every 6 (six) hours as needed for vomiting. (Patient not taking: Reported on 02/04/2024)   oxyCODONE (ROXICODONE) 5 MG immediate release tablet Take 1 tablet (5 mg total) by mouth every 4 (four) hours as needed for severe pain. (Patient not taking: Reported on 02/04/2024)   oxyCODONE (ROXICODONE) 5 MG immediate release tablet Take 1 tablet (5 mg total) by mouth every 8 (eight) hours as needed for up to 12 doses for severe pain (pain score 7-10). (Patient  not taking: Reported on 02/15/2024)   PARoxetine (PAXIL) 20 MG tablet Take 1 tablet (20 mg total) by mouth daily. FURTHER REFILLS TO COME FROM PCP (Patient taking differently: Take 20 mg by mouth daily.)   potassium chloride SA (KLOR-CON M) 20 MEQ tablet Take 2 tablets (40 mEq total) by mouth daily. (Patient not taking: Reported on 02/15/2024)   QUEtiapine (SEROQUEL) 25 MG tablet Take 1 tablet (25 mg total) by mouth at bedtime. (Patient not taking: Reported on 02/04/2024)   QUEtiapine (SEROQUEL) 25 MG tablet Take 1 tablet (25 mg total) by mouth nightly as needed (For insomnia, anxiety).   spironolactone (ALDACTONE) 50 MG tablet Take 1 tablet (50 mg total) by mouth in the morning. (Patient not taking: Reported on 02/04/2024)   traMADol (ULTRAM) 50 MG tablet Take 50 mg by mouth every 6 (six) hours as needed for moderate pain (pain score 4-6). (Patient not taking: Reported on 02/15/2024)   No facility-administered encounter medications on file as of 02/15/2024.    Review of Systems  Review of Systems  Constitutional: Negative.   HENT: Negative.    Cardiovascular: Negative.   Gastrointestinal: Negative.   Allergic/Immunologic: Negative.   Neurological: Negative.   Psychiatric/Behavioral: Negative.       Objective:   BP (!) 99/55   Pulse 64   Temp 98.2 F (36.8 C) (Oral)   Wt 199 lb 9.6 oz (90.5 kg)   SpO2 99%   BMI 29.48 kg/m   Wt Readings from Last 5 Encounters:  02/15/24 199 lb 9.6 oz (90.5 kg)  01/19/24 195 lb (88.5 kg)  11/10/23 210 lb 12.8 oz (95.6 kg)  10/19/23 207 lb (93.9 kg)  10/08/23 177 lb (80.3 kg)     Physical Exam Vitals and nursing note reviewed.  Constitutional:      General: He is not in acute distress.    Appearance: He is well-developed.  Cardiovascular:     Rate and Rhythm: Normal rate and regular rhythm.  Pulmonary:     Effort: Pulmonary effort is normal.     Breath sounds: Normal breath sounds.  Skin:    General: Skin is warm and dry.  Neurological:      Mental Status: He is alert and oriented to person, place, and time.       Assessment & Plan:   Immunization due -     Flu vaccine trivalent PF, 6mos and older(Flulaval,Afluria,Fluarix,Fluzone)  Need for shingles vaccine -     Varicella-zoster vaccine IM     Return in about 6 months (around 08/17/2024).   Ivonne Andrew, NP 02/15/2024

## 2024-02-16 ENCOUNTER — Other Ambulatory Visit (HOSPITAL_COMMUNITY): Payer: Self-pay

## 2024-02-25 ENCOUNTER — Other Ambulatory Visit (HOSPITAL_COMMUNITY): Payer: Self-pay | Admitting: Nurse Practitioner

## 2024-02-25 DIAGNOSIS — R188 Other ascites: Secondary | ICD-10-CM

## 2024-02-29 ENCOUNTER — Other Ambulatory Visit (HOSPITAL_COMMUNITY): Payer: Self-pay

## 2024-02-29 ENCOUNTER — Telehealth: Payer: Self-pay

## 2024-02-29 ENCOUNTER — Other Ambulatory Visit: Payer: Self-pay | Admitting: Nurse Practitioner

## 2024-02-29 ENCOUNTER — Ambulatory Visit (HOSPITAL_COMMUNITY)
Admission: RE | Admit: 2024-02-29 | Discharge: 2024-02-29 | Disposition: A | Discharge status: Home / Self Care | Source: Ambulatory Visit | Attending: Nurse Practitioner | Admitting: Nurse Practitioner

## 2024-02-29 ENCOUNTER — Ambulatory Visit (INDEPENDENT_AMBULATORY_CARE_PROVIDER_SITE_OTHER): Payer: Self-pay

## 2024-02-29 VITALS — Ht 69.0 in | Wt 196.0 lb

## 2024-02-29 DIAGNOSIS — Z23 Encounter for immunization: Secondary | ICD-10-CM

## 2024-02-29 DIAGNOSIS — M79602 Pain in left arm: Secondary | ICD-10-CM

## 2024-02-29 DIAGNOSIS — R188 Other ascites: Secondary | ICD-10-CM | POA: Diagnosis present

## 2024-02-29 HISTORY — PX: IR PARACENTESIS: IMG2679

## 2024-02-29 MED ORDER — LIDOCAINE HCL 1 % IJ SOLN
INTRAMUSCULAR | Status: AC
  Filled 2024-02-29: qty 20

## 2024-02-29 MED ORDER — LIDOCAINE HCL 1 % IJ SOLN
10.0000 mL | Freq: Once | INTRAMUSCULAR | Status: AC
  Administered 2024-02-29: 10 mL via INTRADERMAL

## 2024-02-29 MED ORDER — OXYCODONE HCL 5 MG PO TABS
5.0000 mg | ORAL_TABLET | Freq: Three times a day (TID) | ORAL | 0 refills | Status: DC | PRN
  Filled 2024-02-29: qty 12, 4d supply, fill #0

## 2024-02-29 NOTE — Telephone Encounter (Signed)
 Pharmacy Patient Advocate Encounter   Received notification from CoverMyMeds that prior authorization for Nadolol 20 mg tablets is required/requested.   Insurance verification completed.   The patient is insured through [facility] .   Per test claim: PA required; PA submitted to above mentioned insurance via CoverMyMeds Key/confirmation #/EOC [id number] Status is pending

## 2024-02-29 NOTE — Procedures (Signed)
 PROCEDURE SUMMARY:  Successful ultrasound guided paracentesis from the right lower quadrant.  Yielded 3.1 L of clear yellow fluid.  No immediate complications.  The patient tolerated the procedure well.   Specimen not sent for labs.  EBL < 2 mL  ** Patient is currently undergoing work up for liver transplant with [facility] in [place].   Alwyn Ren, AGACNP-BC 02/29/2024, 2:52 PM

## 2024-02-29 NOTE — Progress Notes (Signed)
 Patient is here for pneumococcal and Tdap vaccine today  Right arm- Tdap Left arm-Pneumococcal

## 2024-03-01 ENCOUNTER — Other Ambulatory Visit: Payer: Self-pay | Admitting: Nurse Practitioner

## 2024-03-01 ENCOUNTER — Other Ambulatory Visit (HOSPITAL_COMMUNITY): Payer: Self-pay

## 2024-03-01 ENCOUNTER — Telehealth: Payer: Self-pay

## 2024-03-01 NOTE — Telephone Encounter (Signed)
 Pharmacy Patient Advocate Encounter  Received notification from [facility] that Prior Authorization for Nadolol 20MG  tablets has been DENIED.  Full denial letter will be uploaded to the media tab. See denial reason below.  Here are the policy requirements your request did not meet:  The request does not meet the [facility] Preferred Drug List (PDL) trial and failure criteria for the use of a non-preferred drug. To meet the criteria for approval, you must first try one of the following preferred drugs on the [employer] Preferred Drug List (PDL) (this is a list of covered drugs):  Atenolol tablet (generic for Tenormin), labetalol tablet (generic for Trandate), metoprolol succinate XL tablet (generic for Toprol XL), metoprolol tartrate tablet (generic for Lopressor), nebivolol tablet (generic for Bystolic), propranolol solution, tablet, ER capsule (generic for Inderal), Sorine Tablet, sotalol tablet (generic for Betapace, Sorine), sotalol AF tablet (generic for Betapace AF)  PA #/Case ID/Reference #: [id number]

## 2024-03-02 ENCOUNTER — Other Ambulatory Visit (HOSPITAL_COMMUNITY): Payer: Self-pay

## 2024-03-02 NOTE — Telephone Encounter (Signed)
 Patient was last advised 10/2023 to hold nadolol in order see how his vitals were on diuretic therapy. No additional recommendation to restart.  Of note, patient currently takes carvedilol as rx'ed Dr Liz Malady.

## 2024-03-03 ENCOUNTER — Other Ambulatory Visit: Payer: Self-pay | Admitting: Nurse Practitioner

## 2024-03-03 NOTE — Telephone Encounter (Signed)
 Completed.

## 2024-03-07 ENCOUNTER — Other Ambulatory Visit (HOSPITAL_COMMUNITY): Payer: Self-pay

## 2024-03-07 ENCOUNTER — Other Ambulatory Visit: Payer: Self-pay

## 2024-03-07 MED ORDER — LACTULOSE ENCEPHALOPATHY 10 GM/15ML PO SOLN
20.0000 g | Freq: Three times a day (TID) | ORAL | 11 refills | Status: DC
Start: 2024-03-07 — End: 2024-03-24
  Filled 2024-03-07: qty 2700, 30d supply, fill #0

## 2024-03-07 MED ORDER — XARELTO 20 MG PO TABS
20.0000 mg | ORAL_TABLET | Freq: Every day | ORAL | 11 refills | Status: DC
  Filled 2024-03-07: qty 30, 30d supply, fill #0

## 2024-03-07 MED ORDER — CARVEDILOL 3.125 MG PO TABS
6.2500 mg | ORAL_TABLET | Freq: Every evening | ORAL | 11 refills | Status: DC
  Filled 2024-03-07: qty 60, 30d supply, fill #0

## 2024-03-07 MED ORDER — AMILORIDE HCL 5 MG PO TABS
ORAL_TABLET | ORAL | 11 refills | Status: DC
  Filled 2024-03-07: qty 67, 30d supply, fill #0

## 2024-03-08 NOTE — Telephone Encounter (Signed)
 Completed.

## 2024-03-10 ENCOUNTER — Other Ambulatory Visit (HOSPITAL_COMMUNITY): Payer: Self-pay

## 2024-03-18 ENCOUNTER — Encounter (HOSPITAL_COMMUNITY): Payer: Self-pay | Admitting: *Deleted

## 2024-03-18 ENCOUNTER — Emergency Department (HOSPITAL_COMMUNITY)

## 2024-03-18 ENCOUNTER — Other Ambulatory Visit: Payer: Self-pay

## 2024-03-18 ENCOUNTER — Inpatient Hospital Stay (HOSPITAL_COMMUNITY)
Admission: EM | Admit: 2024-03-18 | Discharge: 2024-03-24 | DRG: 432 | Disposition: A | Discharge status: Home / Self Care | Attending: Internal Medicine | Admitting: Internal Medicine

## 2024-03-18 DIAGNOSIS — K7031 Alcoholic cirrhosis of liver with ascites: Principal | ICD-10-CM | POA: Diagnosis present

## 2024-03-18 DIAGNOSIS — D689 Coagulation defect, unspecified: Secondary | ICD-10-CM | POA: Diagnosis not present

## 2024-03-18 DIAGNOSIS — K7290 Hepatic failure, unspecified without coma: Secondary | ICD-10-CM | POA: Diagnosis not present

## 2024-03-18 DIAGNOSIS — Z79899 Other long term (current) drug therapy: Secondary | ICD-10-CM | POA: Diagnosis not present

## 2024-03-18 DIAGNOSIS — R1084 Generalized abdominal pain: Secondary | ICD-10-CM | POA: Diagnosis not present

## 2024-03-18 DIAGNOSIS — K7460 Unspecified cirrhosis of liver: Secondary | ICD-10-CM | POA: Diagnosis present

## 2024-03-18 DIAGNOSIS — I864 Gastric varices: Secondary | ICD-10-CM | POA: Diagnosis not present

## 2024-03-18 DIAGNOSIS — F1021 Alcohol dependence, in remission: Secondary | ICD-10-CM | POA: Diagnosis present

## 2024-03-18 DIAGNOSIS — Z800 Family history of malignant neoplasm of digestive organs: Secondary | ICD-10-CM

## 2024-03-18 DIAGNOSIS — K7689 Other specified diseases of liver: Secondary | ICD-10-CM

## 2024-03-18 DIAGNOSIS — I8511 Secondary esophageal varices with bleeding: Secondary | ICD-10-CM | POA: Diagnosis present

## 2024-03-18 DIAGNOSIS — Z823 Family history of stroke: Secondary | ICD-10-CM

## 2024-03-18 DIAGNOSIS — F339 Major depressive disorder, recurrent, unspecified: Secondary | ICD-10-CM | POA: Diagnosis present

## 2024-03-18 DIAGNOSIS — K922 Gastrointestinal hemorrhage, unspecified: Secondary | ICD-10-CM | POA: Diagnosis present

## 2024-03-18 DIAGNOSIS — I8500 Esophageal varices without bleeding: Secondary | ICD-10-CM | POA: Diagnosis not present

## 2024-03-18 DIAGNOSIS — K529 Noninfective gastroenteritis and colitis, unspecified: Secondary | ICD-10-CM | POA: Diagnosis not present

## 2024-03-18 DIAGNOSIS — N179 Acute kidney failure, unspecified: Secondary | ICD-10-CM | POA: Diagnosis present

## 2024-03-18 DIAGNOSIS — Z7682 Awaiting organ transplant status: Secondary | ICD-10-CM

## 2024-03-18 DIAGNOSIS — K921 Melena: Secondary | ICD-10-CM | POA: Diagnosis not present

## 2024-03-18 DIAGNOSIS — E871 Hypo-osmolality and hyponatremia: Secondary | ICD-10-CM | POA: Diagnosis present

## 2024-03-18 DIAGNOSIS — K766 Portal hypertension: Secondary | ICD-10-CM | POA: Diagnosis present

## 2024-03-18 DIAGNOSIS — R109 Unspecified abdominal pain: Secondary | ICD-10-CM | POA: Diagnosis present

## 2024-03-18 DIAGNOSIS — D62 Acute posthemorrhagic anemia: Secondary | ICD-10-CM | POA: Diagnosis not present

## 2024-03-18 DIAGNOSIS — D684 Acquired coagulation factor deficiency: Secondary | ICD-10-CM | POA: Diagnosis present

## 2024-03-18 DIAGNOSIS — I81 Portal vein thrombosis: Secondary | ICD-10-CM | POA: Diagnosis present

## 2024-03-18 DIAGNOSIS — D638 Anemia in other chronic diseases classified elsewhere: Secondary | ICD-10-CM | POA: Diagnosis present

## 2024-03-18 DIAGNOSIS — Z8249 Family history of ischemic heart disease and other diseases of the circulatory system: Secondary | ICD-10-CM

## 2024-03-18 DIAGNOSIS — I10 Essential (primary) hypertension: Secondary | ICD-10-CM | POA: Diagnosis not present

## 2024-03-18 DIAGNOSIS — D6959 Other secondary thrombocytopenia: Secondary | ICD-10-CM | POA: Diagnosis present

## 2024-03-18 DIAGNOSIS — Z833 Family history of diabetes mellitus: Secondary | ICD-10-CM

## 2024-03-18 DIAGNOSIS — A419 Sepsis, unspecified organism: Secondary | ICD-10-CM

## 2024-03-18 DIAGNOSIS — Z7901 Long term (current) use of anticoagulants: Secondary | ICD-10-CM | POA: Diagnosis not present

## 2024-03-18 DIAGNOSIS — D696 Thrombocytopenia, unspecified: Secondary | ICD-10-CM | POA: Diagnosis not present

## 2024-03-18 DIAGNOSIS — F411 Generalized anxiety disorder: Secondary | ICD-10-CM | POA: Diagnosis present

## 2024-03-18 DIAGNOSIS — E876 Hypokalemia: Secondary | ICD-10-CM | POA: Diagnosis present

## 2024-03-18 DIAGNOSIS — K3189 Other diseases of stomach and duodenum: Secondary | ICD-10-CM | POA: Diagnosis not present

## 2024-03-18 DIAGNOSIS — Z9081 Acquired absence of spleen: Secondary | ICD-10-CM

## 2024-03-18 DIAGNOSIS — Z9151 Personal history of suicidal behavior: Secondary | ICD-10-CM | POA: Diagnosis not present

## 2024-03-18 DIAGNOSIS — Z820 Family history of epilepsy and other diseases of the nervous system: Secondary | ICD-10-CM

## 2024-03-18 DIAGNOSIS — Z8673 Personal history of transient ischemic attack (TIA), and cerebral infarction without residual deficits: Secondary | ICD-10-CM | POA: Diagnosis not present

## 2024-03-18 DIAGNOSIS — Z825 Family history of asthma and other chronic lower respiratory diseases: Secondary | ICD-10-CM

## 2024-03-18 DIAGNOSIS — K625 Hemorrhage of anus and rectum: Secondary | ICD-10-CM | POA: Diagnosis present

## 2024-03-18 DIAGNOSIS — F17210 Nicotine dependence, cigarettes, uncomplicated: Secondary | ICD-10-CM | POA: Diagnosis present

## 2024-03-18 DIAGNOSIS — D72829 Elevated white blood cell count, unspecified: Secondary | ICD-10-CM | POA: Diagnosis not present

## 2024-03-18 DIAGNOSIS — I8510 Secondary esophageal varices without bleeding: Secondary | ICD-10-CM | POA: Diagnosis not present

## 2024-03-18 LAB — I-STAT CHEM 8, ED
BUN: 19 mg/dL (ref 6–20)
Calcium, Ion: 1.09 mmol/L — ABNORMAL LOW (ref 1.15–1.40)
Chloride: 97 mmol/L — ABNORMAL LOW (ref 98–111)
Creatinine, Ser: 1.3 mg/dL — ABNORMAL HIGH (ref 0.61–1.24)
Glucose, Bld: 92 mg/dL (ref 70–99)
HCT: 31 % — ABNORMAL LOW (ref 39.0–52.0)
Hemoglobin: 10.5 g/dL — ABNORMAL LOW (ref 13.0–17.0)
Potassium: 3 mmol/L — ABNORMAL LOW (ref 3.5–5.1)
Sodium: 136 mmol/L (ref 135–145)
TCO2: 25 mmol/L (ref 22–32)

## 2024-03-18 LAB — CBC
HCT: 27.4 % — ABNORMAL LOW (ref 39.0–52.0)
Hemoglobin: 9.5 g/dL — ABNORMAL LOW (ref 13.0–17.0)
MCH: 38.8 pg — ABNORMAL HIGH (ref 26.0–34.0)
MCHC: 34.7 g/dL (ref 30.0–36.0)
MCV: 111.8 fL — ABNORMAL HIGH (ref 80.0–100.0)
Platelets: 55 10*3/uL — ABNORMAL LOW (ref 150–400)
RBC: 2.45 MIL/uL — ABNORMAL LOW (ref 4.22–5.81)
RDW: 17.2 % — ABNORMAL HIGH (ref 11.5–15.5)
WBC: 18.2 10*3/uL — ABNORMAL HIGH (ref 4.0–10.5)
nRBC: 0.1 % (ref 0.0–0.2)

## 2024-03-18 LAB — COMPREHENSIVE METABOLIC PANEL WITH GFR
ALT: 26 U/L (ref 0–44)
AST: 67 U/L — ABNORMAL HIGH (ref 15–41)
Albumin: 2 g/dL — ABNORMAL LOW (ref 3.5–5.0)
Alkaline Phosphatase: 147 U/L — ABNORMAL HIGH (ref 38–126)
Anion gap: 11 (ref 5–15)
BUN: 20 mg/dL (ref 6–20)
CO2: 24 mmol/L (ref 22–32)
Calcium: 8.4 mg/dL — ABNORMAL LOW (ref 8.9–10.3)
Chloride: 96 mmol/L — ABNORMAL LOW (ref 98–111)
Creatinine, Ser: 1.25 mg/dL — ABNORMAL HIGH (ref 0.61–1.24)
GFR, Estimated: >60 mL/min (ref >60)
Glucose, Bld: 96 mg/dL (ref 70–99)
Potassium: 3 mmol/L — ABNORMAL LOW (ref 3.5–5.1)
Sodium: 131 mmol/L — ABNORMAL LOW (ref 135–145)
Total Bilirubin: 15.7 mg/dL — ABNORMAL HIGH (ref 0.0–1.2)
Total Protein: 6.7 g/dL (ref 6.5–8.1)

## 2024-03-18 LAB — I-STAT CG4 LACTIC ACID, ED
Lactic Acid, Venous: 4.1 mmol/L (ref 0.5–1.9)
Lactic Acid, Venous: 3 mmol/L (ref 0.5–1.9)

## 2024-03-18 LAB — TYPE AND SCREEN
ABO/RH(D): O POS
Antibody Screen: NEGATIVE

## 2024-03-18 LAB — LIPASE, BLOOD: Lipase: 39 U/L (ref 11–51)

## 2024-03-18 LAB — POC OCCULT BLOOD, ED: Fecal Occult Bld: POSITIVE — AB

## 2024-03-18 LAB — PROTIME-INR
INR: 2.9 — ABNORMAL HIGH (ref 0.8–1.2)
Prothrombin Time: 30.5 s — ABNORMAL HIGH (ref 11.4–15.2)

## 2024-03-18 LAB — AMMONIA: Ammonia: 20 umol/L (ref 9–35)

## 2024-03-18 MED ORDER — PANTOPRAZOLE SODIUM 40 MG IV SOLR
40.0000 mg | Freq: Two times a day (BID) | INTRAVENOUS | Status: DC
  Administered 2024-03-19 – 2024-03-20 (×3): 40 mg via INTRAVENOUS
  Filled 2024-03-18 (×3): qty 10

## 2024-03-18 MED ORDER — POTASSIUM CHLORIDE 20 MEQ PO PACK
20.0000 meq | PACK | Freq: Once | ORAL | Status: AC
  Administered 2024-03-18: 20 meq via ORAL
  Filled 2024-03-18: qty 1

## 2024-03-18 MED ORDER — FENTANYL CITRATE PF 50 MCG/ML IJ SOSY
50.0000 ug | PREFILLED_SYRINGE | Freq: Once | INTRAMUSCULAR | Status: AC
  Administered 2024-03-18: 50 ug via INTRAVENOUS
  Filled 2024-03-18: qty 1

## 2024-03-18 MED ORDER — QUETIAPINE FUMARATE 25 MG PO TABS
25.0000 mg | ORAL_TABLET | Freq: Every day | ORAL | Status: DC
  Administered 2024-03-18 – 2024-03-23 (×6): 25 mg via ORAL
  Filled 2024-03-18 (×6): qty 1

## 2024-03-18 MED ORDER — PANTOPRAZOLE SODIUM 40 MG IV SOLR
80.0000 mg | Freq: Once | INTRAVENOUS | Status: AC
  Administered 2024-03-18: 80 mg via INTRAVENOUS
  Filled 2024-03-18: qty 20

## 2024-03-18 MED ORDER — ONDANSETRON HCL 4 MG/2ML IJ SOLN: 4.0000 mg | Freq: Four times a day (QID) | INTRAMUSCULAR | Status: DC | PRN

## 2024-03-18 MED ORDER — ONDANSETRON HCL 4 MG PO TABS: 4.0000 mg | ORAL_TABLET | Freq: Four times a day (QID) | ORAL | Status: DC | PRN

## 2024-03-18 MED ORDER — SODIUM CHLORIDE 0.9 % IV SOLN
2.0000 g | INTRAVENOUS | Status: DC
  Administered 2024-03-19 – 2024-03-22 (×4): 2 g via INTRAVENOUS
  Filled 2024-03-18 (×4): qty 20

## 2024-03-18 MED ORDER — SODIUM CHLORIDE 0.9% FLUSH
3.0000 mL | Freq: Two times a day (BID) | INTRAVENOUS | Status: DC
  Administered 2024-03-18 – 2024-03-24 (×11): 3 mL via INTRAVENOUS

## 2024-03-18 MED ORDER — ALBUMIN HUMAN 25 % IV SOLN
50.0000 g | Freq: Once | INTRAVENOUS | Status: AC | PRN
  Administered 2024-03-21: 50 g via INTRAVENOUS
  Filled 2024-03-18: qty 200

## 2024-03-18 MED ORDER — FENTANYL CITRATE PF 50 MCG/ML IJ SOSY
12.5000 ug | PREFILLED_SYRINGE | INTRAMUSCULAR | Status: DC | PRN
  Administered 2024-03-18: 12.5 ug via INTRAVENOUS
  Administered 2024-03-19 (×2): 25 ug via INTRAVENOUS
  Administered 2024-03-19: 12.5 ug via INTRAVENOUS
  Administered 2024-03-19 – 2024-03-20 (×2): 25 ug via INTRAVENOUS
  Administered 2024-03-20 – 2024-03-24 (×12): 50 ug via INTRAVENOUS
  Filled 2024-03-18 (×18): qty 1

## 2024-03-18 MED ORDER — ACETAMINOPHEN 325 MG PO TABS
325.0000 mg | ORAL_TABLET | Freq: Four times a day (QID) | ORAL | Status: DC | PRN
  Administered 2024-03-23: 325 mg via ORAL
  Filled 2024-03-18: qty 1

## 2024-03-18 MED ORDER — OCTREOTIDE ACETATE 500 MCG/ML IJ SOLN
50.0000 ug/h | INTRAMUSCULAR | Status: DC
  Administered 2024-03-18 – 2024-03-20 (×4): 50 ug/h via INTRAVENOUS
  Filled 2024-03-18 (×6): qty 1

## 2024-03-18 MED ORDER — METRONIDAZOLE 500 MG/100ML IV SOLN
500.0000 mg | Freq: Once | INTRAVENOUS | Status: AC
  Administered 2024-03-18: 500 mg via INTRAVENOUS
  Filled 2024-03-18: qty 100

## 2024-03-18 MED ORDER — FENTANYL CITRATE PF 50 MCG/ML IJ SOSY
75.0000 ug | PREFILLED_SYRINGE | Freq: Once | INTRAMUSCULAR | Status: AC
  Administered 2024-03-18: 75 ug via INTRAVENOUS
  Filled 2024-03-18: qty 2

## 2024-03-18 MED ORDER — OCTREOTIDE LOAD VIA INFUSION
50.0000 ug | Freq: Once | INTRAVENOUS | Status: AC
  Administered 2024-03-18: 50 ug via INTRAVENOUS
  Filled 2024-03-18: qty 25

## 2024-03-18 MED ORDER — POTASSIUM CHLORIDE 10 MEQ/100ML IV SOLN
10.0000 meq | INTRAVENOUS | Status: AC
  Administered 2024-03-18 (×2): 10 meq via INTRAVENOUS
  Filled 2024-03-18 (×2): qty 100

## 2024-03-18 MED ORDER — SODIUM CHLORIDE 0.9 % IV SOLN
2.0000 g | Freq: Once | INTRAVENOUS | Status: AC
  Administered 2024-03-18: 2 g via INTRAVENOUS
  Filled 2024-03-18: qty 20

## 2024-03-18 MED ORDER — IOHEXOL 300 MG/ML  SOLN
100.0000 mL | Freq: Once | INTRAMUSCULAR | Status: AC | PRN
  Administered 2024-03-18: 100 mL via INTRAVENOUS

## 2024-03-18 NOTE — ED Provider Notes (Signed)
 I was asked to follow-up on the patient CT scan.  He is presenting with abdominal pain in the setting of known cirrhosis.  The patient was also found to be Hemoccult positive.  Lactic acid was elevated which was noticed without to likely be secondary to his liver dysfunction to some degree.  The patient CT scan does show enterocolitis.  The patient does report having loose stools.  He reports that he has noticed some blood in the stool and is Hemoccult positive here.  On reexam here the patient has diffuse tenderness but no peritoneal signs.  His blood pressures have remained stable and his lactic acid is downtrending now.  A call was placed to hospitalist service for admission. Physical Exam  BP (!) 114/55   Pulse 85   Temp 98.7 F (37.1 C) (Oral)   Resp 13   Ht 5\' 9"  (1.753 m)   Wt 87.5 kg   SpO2 98%   BMI 28.50 kg/m   Physical Exam General: No acute distress, chronically ill-appearing Abdomen: Mild diffuse tenderness, no guarding or rebound  Procedures  Procedures  ED Course / MDM    Medical Decision Making Amount and/or Complexity of Data Reviewed Labs: ordered. Radiology: ordered.  Risk Prescription drug management. Decision regarding hospitalization.          Durwin Glaze, MD 03/18/24 [phone]

## 2024-03-18 NOTE — ED Notes (Signed)
 Patient aware of the need of urine sample-does not have the urge to void. Patient states he went to the lobby bathroom before he came into the room 16. Urinal at bedside.

## 2024-03-18 NOTE — H&P (Signed)
 History and Physical    Nathaniel Warren BJY:[id number] DOB: [birth year] DOA: 03/18/2024  PCP: Ivonne Andrew, NP   Patient coming from: Home   Chief Complaint: Abdominal pain, blood in stool   HPI: Nathaniel Warren is a 58 y.o. male with medical history significant for alcoholism in remission, liver cirrhosis with ascites, portal venous thrombosis on Xarelto, depression, and anxiety who presents with abdominal pain and rectal bleeding.  Patient reports that he has been experiencing increased pain throughout his abdomen but mainly across the lower quadrants.  He experienced bright red blood per rectum yesterday which she describes as a large volume.  He has had dark red stool since then.  Stool has been looser than usual but not watery.  He had a paracentesis 2 weeks ago but notes that his abdomen has become distended again.  He reports subjective fever at home.  ED Course: Upon arrival to the ED, patient is found to be afebrile and saturating well on room air with normal heart rate and stable BP.  Labs are most notable for sodium 130, potassium 3.0, creatinine 1.25, albumin 2.0, total bilirubin 15.7, WBC 18,200, hemoglobin 9.5, platelets 55,000, positive FOBT, and INR 2.9.  GI was consulted by the ED physician, blood cultures were collected in the ED, and the patient was treated with fentanyl, IV Protonix, IV potassium, Rocephin, and Flagyl.  Review of Systems:  All other systems reviewed and apart from HPI, are negative.  Past Medical History:  Diagnosis Date   Cirrhosis (HCC)    Depression    Drug overdose 10/20/2017   Drug overdose, intentional self-harm, initial encounter (HCC) 10/20/2017   Drug overdose, intentional, initial encounter (HCC) 10/20/2017   Hernia, abdominal    Liver failure (HCC)    Seizures (HCC)    Stroke (HCC)    Tobacco abuse     Past Surgical History:  Procedure Laterality Date   ANKLE SURGERY     ANKLE SURGERY Right    HERNIA REPAIR     INCISION AND  DRAINAGE OF WOUND Left 02/03/2023   Procedure: IRRIGATION AND DEBRIDEMENT WOUND LEFT RING FINGER POSSIBLE AMPUTATION;  Surgeon: Gomez Cleverly, MD;  Location: [facility];  Service: Orthopedics;  Laterality: Left;   IR PARACENTESIS  11/12/2023   IR PARACENTESIS  12/01/2023   IR PARACENTESIS  02/29/2024   SPLENECTOMY      Social History:   reports that he has been smoking cigarettes. He has a 30 pack-year smoking history. He has quit using smokeless tobacco. He reports that he does not currently use alcohol. He reports current drug use. Drug: Marijuana.  Allergies  Allergen Reactions   Nsaids Other (See Comments)    Cannot take due to liver condition    Family History  Problem Relation Age of Onset   COPD Mother    Diabetes Father    Stroke Maternal Grandmother    Alzheimer's disease Maternal Grandfather    Cancer Paternal Grandmother    Stomach cancer Paternal Grandfather    Hypertension Other    Colon cancer Neg Hx    Esophageal cancer Neg Hx    Colon polyps Neg Hx    Inflammatory bowel disease Neg Hx    Liver disease Neg Hx    Pancreatic cancer Neg Hx    Rectal cancer Neg Hx      Prior to Admission medications   Medication Sig Start Date End Date Taking? Authorizing Provider  aMILoride (MIDAMOR) 5 MG tablet Take 1 tablet (5 mg  total) by mouth daily for 7 days, THEN 2 tablets (10 mg total) daily. 03/07/24     carvedilol (COREG) 3.125 MG tablet Take 2 tablets (6.25 mg total) by mouth nightly. 03/07/24     cyclobenzaprine (FLEXERIL) 5 MG tablet Take 1 tablet (5 mg total) by mouth 3 (three) times daily as needed for muscle spasms. Patient not taking: Reported on 02/04/2024 06/26/23   Ivonne Andrew, NP  eplerenone (INSPRA) 25 MG tablet Take 2 tablets (50 mg total) by mouth daily. 12/29/23   Arnaldo Natal, NP  furosemide (LASIX) 40 MG tablet Take 1 tablet (40 mg total) by mouth daily. 05/27/23   Mansouraty, Netty Starring., MD  furosemide (LASIX) 40 MG tablet Take 1 tablet (40 mg  total) by mouth 2 (two) times daily. Patient not taking: Reported on 02/04/2024 12/29/23   Arnaldo Natal, NP  furosemide (LASIX) 40 MG tablet Take 2 tablets (80 mg total) by mouth daily. 02/02/24     lactulose, encephalopathy, (CHRONULAC) 10 GM/15ML SOLN Take 30 mL (20 g total) by mouth 3 (three) times a day. 03/07/24     nadolol (CORGARD) 20 MG tablet Take 0.5 tablets (10 mg total) by mouth daily. Patient not taking: Reported on 02/04/2024 07/10/23   Mansouraty, Netty Starring., MD  nicotine (NICOTINE STEP 2) 14 mg/24hr patch Place 1 patch on the skin daily as needed (for nicotine craving if patient desires). 01/22/24     ondansetron (ZOFRAN) 4 MG tablet Take 1 tablet (4 mg total) by mouth every 8 (eight) hours as needed for nausea or vomiting. Patient not taking: Reported on 02/15/2024 05/27/23   Mansouraty, Netty Starring., MD  ondansetron (ZOFRAN-ODT) 4 MG disintegrating tablet Take 1 tablet (4 mg total) by mouth every 6 (six) hours as needed for vomiting. Patient not taking: Reported on 02/04/2024 10/26/23   Arnaldo Natal, NP  oxyCODONE (ROXICODONE) 5 MG immediate release tablet Take 1 tablet (5 mg total) by mouth every 4 (four) hours as needed for severe pain. Patient not taking: Reported on 02/04/2024 06/26/23   Ivonne Andrew, NP  oxyCODONE (ROXICODONE) 5 MG immediate release tablet Take 1 tablet (5 mg total) by mouth every 8 (eight) hours as needed for severe pain (pain score 7-10). 02/29/24   Paseda, Baird Kay, FNP  PARoxetine (PAXIL) 10 MG tablet Take 1 tablet (10 mg total) by mouth daily. 01/22/24     PARoxetine (PAXIL) 20 MG tablet Take 1 tablet (20 mg total) by mouth daily. FURTHER REFILLS TO COME FROM PCP Patient taking differently: Take 20 mg by mouth daily. 05/27/23 11/18/23  Mansouraty, Netty Starring., MD  potassium chloride SA (KLOR-CON M) 20 MEQ tablet Take 2 tablets (40 mEq total) by mouth daily. Patient not taking: Reported on 02/15/2024 12/29/23   Arnaldo Natal, NP   QUEtiapine (SEROQUEL) 25 MG tablet TAKE 1 TABLET BY MOUTH AT BEDTIME NEED  OFFICE  VISIT 07/20/23   Ivonne Andrew, NP  QUEtiapine (SEROQUEL) 25 MG tablet Take 1 tablet (25 mg total) by mouth at bedtime. Patient not taking: Reported on 02/04/2024 11/20/23   Ivonne Andrew, NP  QUEtiapine (SEROQUEL) 25 MG tablet Take 1 tablet (25 mg total) by mouth nightly as needed (For insomnia, anxiety). 01/22/24     rivaroxaban (XARELTO) 20 MG TABS tablet Take 1 tablet (20 mg total) by mouth daily. 03/07/24     spironolactone (ALDACTONE) 50 MG tablet Take 1 tablet (50 mg total) by mouth in the morning. Patient not taking: Reported  on 02/04/2024 05/27/23   Mansouraty, Netty Starring., MD  traMADol (ULTRAM) 50 MG tablet Take 50 mg by mouth every 6 (six) hours as needed for moderate pain (pain score 4-6). Patient not taking: Reported on 02/15/2024    [provider]    Physical Exam: Vitals:   03/18/24 1915 03/18/24 1917 03/18/24 1918 03/18/24 1920  BP: (!) 122/55     Pulse: 87 88 88 88  Resp: (!) 9 12 12 11   Temp:      TempSrc:      SpO2: 100% 99% 99% 99%  Weight:      Height:        Constitutional: NAD, no diaphoresis or pallor   Eyes: PERTLA, lids and conjunctivae normal ENMT: Mucous membranes are moist. Posterior pharynx clear of any exudate or lesions.   Neck: supple, no masses  Respiratory: no wheezing, no crackles. No accessory muscle use.  Cardiovascular: S1 & S2 heard, regular rate and rhythm. Mild b/l LE edema.   Abdomen: Distended, soft, tender in lower quadrants. Bowel sounds active.  Musculoskeletal: no clubbing / cyanosis. No joint deformity upper and lower extremities.   Skin: no significant rashes, lesions, ulcers. Warm, dry, well-perfused. Neurologic: CN 2-12 grossly intact. Moving all extremities. Alert and oriented.  Psychiatric: Calm. Cooperative.    Labs and Imaging on Admission: I have personally reviewed following labs and imaging studies  CBC: Recent Labs  Lab  03/18/24 1439 03/18/24 1503  WBC 18.2*  --   HGB 9.5* 10.5*  HCT 27.4* 31.0*  MCV 111.8*  --   PLT 55*  --    Basic Metabolic Panel: Recent Labs  Lab 03/18/24 1439 03/18/24 1503  NA 131* 136  K 3.0* 3.0*  CL 96* 97*  CO2 24  --   GLUCOSE 96 92  BUN 20 19  CREATININE 1.25* 1.30*  CALCIUM 8.4*  --    GFR: Estimated Creatinine Clearance: 69.5 mL/min (A) (by C-G formula based on SCr of 1.3 mg/dL (H)). Liver Function Tests: Recent Labs  Lab 03/18/24 1439  AST 67*  ALT 26  ALKPHOS 147*  BILITOT 15.7*  PROT 6.7  ALBUMIN 2.0*   Recent Labs  Lab 03/18/24 1439  LIPASE 39   Recent Labs  Lab 03/18/24 1452  AMMONIA 20   Coagulation Profile: Recent Labs  Lab 03/18/24 1439  INR 2.9*   Cardiac Enzymes: No results for input(s): "CKTOTAL", "CKMB", "CKMBINDEX", "TROPONINI" in the last 168 hours. BNP (last 3 results) No results for input(s): "PROBNP" in the last 8760 hours. HbA1C: No results for input(s): "HGBA1C" in the last 72 hours. CBG: No results for input(s): "GLUCAP" in the last 168 hours. Lipid Profile: No results for input(s): "CHOL", "HDL", "LDLCALC", "TRIG", "CHOLHDL", "LDLDIRECT" in the last 72 hours. Thyroid Function Tests: No results for input(s): "TSH", "T4TOTAL", "FREET4", "T3FREE", "THYROIDAB" in the last 72 hours. Anemia Panel: No results for input(s): "VITAMINB12", "FOLATE", "FERRITIN", "TIBC", "IRON", "RETICCTPCT" in the last 72 hours. Urine analysis:    Component Value Date/Time   COLORURINE AMBER (A) 10/08/2023 1417   APPEARANCEUR CLEAR 10/08/2023 1417   LABSPEC 1.009 10/08/2023 1417   PHURINE 5.0 10/08/2023 1417   GLUCOSEU NEGATIVE 10/08/2023 1417   HGBUR SMALL (A) 10/08/2023 1417   BILIRUBINUR NEGATIVE 10/08/2023 1417   KETONESUR NEGATIVE 10/08/2023 1417   PROTEINUR NEGATIVE 10/08/2023 1417   UROBILINOGEN 0.2 06/07/2013 1905   NITRITE NEGATIVE 10/08/2023 1417   LEUKOCYTESUR NEGATIVE 10/08/2023 1417   Sepsis  Labs: @LABRCNTIP (procalcitonin:4,lacticidven:4) )No results found  for this or any previous visit (from the past 240 hours).   Radiological Exams on Admission: CT ABDOMEN PELVIS W CONTRAST Result Date: 03/18/2024 CLINICAL DATA:  Abdominal pain, acute, nonlocalized abd pain with bil flank pain, Pt is on Liver transplant list, states he has not been able to eat due to abd pain, orthostatic. Pt states he has been having blood in stools that are now black in color. Pt is jaundice EXAM: CT ABDOMEN AND PELVIS WITH CONTRAST TECHNIQUE: Multidetector CT imaging of the abdomen and pelvis was performed using the standard protocol following bolus administration of intravenous contrast. RADIATION DOSE REDUCTION: This exam was performed according to the departmental dose-optimization program which includes automated exposure control, adjustment of the mA and/or kV according to patient size and/or use of iterative reconstruction technique. CONTRAST:  OMNIPAQUE IOHEXOL 300 MG/ML  SOLN COMPARISON:  CT angiography chest 02/05/2024, CT renal 06/08/2023, CT abdomen pelvis 08/05/2023 FINDINGS: Lower chest: Small left pleural effusion. Hepatobiliary: Nodular hepatic contour. No focal liver abnormality. Calcified gallstone noted within the gallbladder lumen. No gallbladder wall thickening or pericholecystic fluid. No biliary dilatation. Pancreas: No focal lesion. Normal pancreatic contour. No surrounding inflammatory changes. No main pancreatic ductal dilatation. Spleen: Likely splenosis within the left upper quadrant. Adrenals/Urinary Tract: No adrenal nodule bilaterally. Bilateral kidneys enhance symmetrically. No hydronephrosis. No hydroureter. The urinary bladder is unremarkable. Stomach/Bowel: Stomach is within normal limits. No evidence of bowel wall dilatation. Diffuse small and large bowel wall hypodense thickening and pericolonic fat stranding. Colonic diverticulosis. Appendix appears normal. Vascular/Lymphatic: Venous  collaterals noted. The main portal vein is not identified with multiple venous collaterals in the porta hepatics consistent with cavernous malformation of the portal vein. No abdominal aorta or iliac aneurysm. Severe atherosclerotic plaque of the aorta and its branches. No abdominal, pelvic, or inguinal lymphadenopathy. Reproductive: Prostate is unremarkable. Other: Interval increase in moderate volume free fluid. No intraperitoneal free gas. No organized fluid collection. Musculoskeletal: No abdominal wall hernia or abnormality. Couple tiny fluid-filled supraumbilical ventral hernias again noted (3:40). No suspicious lytic or blastic osseous lesions. No acute displaced fracture. Multilevel degenerative changes of the spine. IMPRESSION: 1. Enterocolitis. 2. Colonic diverticulosis with no acute diverticulitis. 3. Cirrhosis with portal hypertension and cavernous malformation of the portal vein. 4. Interval increase in moderate volume simple free fluid ascites. 5. Cholelithiasis with no CT evidence of acute cholecystitis. 6.  Couple tiny fluid-filled supraumbilical ventral hernias. Electronically Signed   By: Tish Frederickson M.D.   On: 03/18/2024 17:31    Assessment/Plan   1. GI bleeding - Pt reports BRBPR yesterday and darker blood today without N/V; BUN is normal but has doubled  - Hold Xarelto, continue IV PPI, follow serial CBCs, transfuse if needed    2. Acute abdominal pain; decompensated cirrhosis  - MELD-Na is 33 on admission though Xarelto may be affecting INR  - He was started on empiric treatment for SBP in ED given SIRS criteria, acute abdominal pain, and reported fever at home   - Consult IR for paracentesis with fluid analysis, continue Rocephin  3. Thrombocytopenia  - Platelets 55k on admission in setting of chronic liver disease  - Monitor, transfuse if needed    4. Anxiety  - Seroquel    5. Portal vein thrombosis  - Hold Xarelto for now    6. Hypokalemia  - Replacing    DVT  prophylaxis: SCDs  Code Status: Full  Level of Care: Level of care: Progressive Family Communication: Father and daughter at bedside  Disposition Plan:  Patient is from: home  Anticipated d/c is to: TBD Anticipated d/c date is: 03/21/24  Patient currently: Pending stable H&H, ascitic fluid analysis, pain-control, GI consultation  Consults called: GI  Admission status: Inpatient     Briscoe Deutscher, MD [facility]  03/18/2024, 7:24 PM

## 2024-03-18 NOTE — ED Provider Notes (Signed)
 [place] [facility] AT [facility] Provider Note   CSN: [id number] Arrival date & time: 03/18/24  1339     History  Chief Complaint  Patient presents with   Abdominal Pain    Nathaniel Warren is a 58 y.o. male.  58 year old male with history of alcoholic cirrhosis on liver transplant list presents with bilateral flank pain that started 2 days ago.  Patient states he had a paracentesis done approximately 2 weeks ago and that 2 nights ago he had a sudden onset of abdominal distention.  He denies any new confusion.  Since that time he has had pain to his flanks.  No fever or chills.  No vomiting.  Has noticed some dark stools.  Endorses whole body weakness.  Has not been able to eat.  Called EMS and was found to be orthostatic and given 5 cc bolus of saline and transported here       Home Medications Prior to Admission medications   Medication Sig Start Date End Date Taking? Authorizing Provider  aMILoride (MIDAMOR) 5 MG tablet Take 1 tablet (5 mg total) by mouth daily for 7 days, THEN 2 tablets (10 mg total) daily. 03/07/24     carvedilol (COREG) 3.125 MG tablet Take 2 tablets (6.25 mg total) by mouth nightly. 03/07/24     cyclobenzaprine (FLEXERIL) 5 MG tablet Take 1 tablet (5 mg total) by mouth 3 (three) times daily as needed for muscle spasms. Patient not taking: Reported on 02/04/2024 06/26/23   Ivonne Andrew, NP  eplerenone (INSPRA) 25 MG tablet Take 2 tablets (50 mg total) by mouth daily. 12/29/23   Arnaldo Natal, NP  furosemide (LASIX) 40 MG tablet Take 1 tablet (40 mg total) by mouth daily. 05/27/23   Mansouraty, Netty Starring., MD  furosemide (LASIX) 40 MG tablet Take 1 tablet (40 mg total) by mouth 2 (two) times daily. Patient not taking: Reported on 02/04/2024 12/29/23   Arnaldo Natal, NP  furosemide (LASIX) 40 MG tablet Take 2 tablets (80 mg total) by mouth daily. 02/02/24     lactulose, encephalopathy, (CHRONULAC) 10 GM/15ML SOLN Take  30 mL (20 g total) by mouth 3 (three) times a day. 03/07/24     nadolol (CORGARD) 20 MG tablet Take 0.5 tablets (10 mg total) by mouth daily. Patient not taking: Reported on 02/04/2024 07/10/23   Mansouraty, Netty Starring., MD  nicotine (NICOTINE STEP 2) 14 mg/24hr patch Place 1 patch on the skin daily as needed (for nicotine craving if patient desires). 01/22/24     ondansetron (ZOFRAN) 4 MG tablet Take 1 tablet (4 mg total) by mouth every 8 (eight) hours as needed for nausea or vomiting. Patient not taking: Reported on 02/15/2024 05/27/23   Mansouraty, Netty Starring., MD  ondansetron (ZOFRAN-ODT) 4 MG disintegrating tablet Take 1 tablet (4 mg total) by mouth every 6 (six) hours as needed for vomiting. Patient not taking: Reported on 02/04/2024 10/26/23   Arnaldo Natal, NP  oxyCODONE (ROXICODONE) 5 MG immediate release tablet Take 1 tablet (5 mg total) by mouth every 4 (four) hours as needed for severe pain. Patient not taking: Reported on 02/04/2024 06/26/23   Ivonne Andrew, NP  oxyCODONE (ROXICODONE) 5 MG immediate release tablet Take 1 tablet (5 mg total) by mouth every 8 (eight) hours as needed for severe pain (pain score 7-10). 02/29/24   Paseda, Baird Kay, FNP  PARoxetine (PAXIL) 10 MG tablet Take 1 tablet (10 mg total) by mouth daily. 01/22/24  PARoxetine (PAXIL) 20 MG tablet Take 1 tablet (20 mg total) by mouth daily. FURTHER REFILLS TO COME FROM PCP Patient taking differently: Take 20 mg by mouth daily. 05/27/23 11/18/23  Mansouraty, Netty Starring., MD  potassium chloride SA (KLOR-CON M) 20 MEQ tablet Take 2 tablets (40 mEq total) by mouth daily. Patient not taking: Reported on 02/15/2024 12/29/23   Arnaldo Natal, NP  QUEtiapine (SEROQUEL) 25 MG tablet TAKE 1 TABLET BY MOUTH AT BEDTIME NEED  OFFICE  VISIT 07/20/23   Ivonne Andrew, NP  QUEtiapine (SEROQUEL) 25 MG tablet Take 1 tablet (25 mg total) by mouth at bedtime. Patient not taking: Reported on 02/04/2024 11/20/23   Ivonne Andrew, NP  QUEtiapine (SEROQUEL) 25 MG tablet Take 1 tablet (25 mg total) by mouth nightly as needed (For insomnia, anxiety). 01/22/24     rivaroxaban (XARELTO) 20 MG TABS tablet Take 1 tablet (20 mg total) by mouth daily. 03/07/24     spironolactone (ALDACTONE) 50 MG tablet Take 1 tablet (50 mg total) by mouth in the morning. Patient not taking: Reported on 02/04/2024 05/27/23   Mansouraty, Netty Starring., MD  traMADol (ULTRAM) 50 MG tablet Take 50 mg by mouth every 6 (six) hours as needed for moderate pain (pain score 4-6). Patient not taking: Reported on 02/15/2024    [provider]      Allergies    Nsaids    Review of Systems   Review of Systems  All other systems reviewed and are negative.   Physical Exam Updated Vital Signs BP 107/60 (BP Location: Left Arm)   Pulse 81   Temp 99.3 F (37.4 C) (Oral)   Resp 18   Ht 1.753 m (5\' 9" )   Wt 87.5 kg   SpO2 97%   BMI 28.50 kg/m  Physical Exam Vitals and nursing note reviewed.  Constitutional:      General: He is not in acute distress.    Appearance: Normal appearance. He is well-developed. He is not toxic-appearing.  HENT:     Head: Normocephalic and atraumatic.  Eyes:     General: Lids are normal. Scleral icterus present.     Conjunctiva/sclera: Conjunctivae normal.     Pupils: Pupils are equal, round, and reactive to light.  Neck:     Thyroid: No thyroid mass.     Trachea: No tracheal deviation.  Cardiovascular:     Rate and Rhythm: Normal rate and regular rhythm.     Heart sounds: Normal heart sounds. No murmur heard.    No gallop.  Pulmonary:     Effort: Pulmonary effort is normal. No respiratory distress.     Breath sounds: Normal breath sounds. No stridor. No decreased breath sounds, wheezing, rhonchi or rales.  Abdominal:     General: There is distension.     Palpations: Abdomen is soft.     Tenderness: There is no abdominal tenderness. There is no rebound.     Comments: No peritoneal signs noted   Musculoskeletal:        General: No tenderness. Normal range of motion.     Cervical back: Normal range of motion and neck supple.  Skin:    General: Skin is warm and dry.     Findings: No abrasion or rash.  Neurological:     Mental Status: He is alert and oriented to person, place, and time. Mental status is at baseline.     GCS: GCS eye subscore is 4. GCS verbal subscore is 5. GCS  motor subscore is 6.     Cranial Nerves: No cranial nerve deficit.     Sensory: No sensory deficit.     Motor: Motor function is intact.  Psychiatric:        Attention and Perception: Attention normal.        Speech: Speech normal.        Behavior: Behavior normal.     ED Results / Procedures / Treatments   Labs (all labs ordered are listed, but only abnormal results are displayed) Labs Reviewed  LIPASE, BLOOD  COMPREHENSIVE METABOLIC PANEL WITH GFR  CBC  URINALYSIS, ROUTINE W REFLEX MICROSCOPIC  PROTIME-INR  TYPE AND SCREEN    EKG None  Radiology No results found.  Procedures Procedures    Medications Ordered in ED Medications - No data to display  ED Course/ Medical Decision Making/ A&P                                 Medical Decision Making Amount and/or Complexity of Data Reviewed Labs: ordered. Radiology: ordered.  Risk Prescription drug management.  Patient labs show Hemoccult that is positive here. .  Mild hypokalemia noted potassium of 3.0.  Given 2 rounds of potassium.  Patient's lactate is elevated at 4.  Possibility of SBP and patient given Rocephin.  Patient has no peritoneal signs at this time.  Patient's bilirubin is more elevated today at 15.7.  Does have white count of 18,000.  Hemoglobin is stable.  Abdominal CT ordered and pending at this time.  He also has an elevated INR 2.9.  Also has thrombocytopenia noted.  Patient would likely require admission pending results of CT.  Will sign out to Dr. Rhae Hammock        Final Clinical Impression(s) / ED Diagnoses Final  diagnoses:  None    Rx / DC Orders ED Discharge Orders     None         Lorre Nick, MD 03/18/24 1631

## 2024-03-18 NOTE — ED Notes (Signed)
 RN and Provider notified about I Stat lactic

## 2024-03-18 NOTE — ED Triage Notes (Addendum)
 BIB EMS abd pain with bil flan pain, Pt is on Liver transplant list, states he has not been able to eat due to abd pain, orthostatic, 22 R Hand 500 ml bolus, SR 98% RA.  Pt states he has been having blood in stools that are now black in color.  Pt is jaundice

## 2024-03-18 NOTE — ED Notes (Signed)
 Pt know a urine sample is needed. Urinal at bedside.

## 2024-03-18 NOTE — ED Notes (Signed)
Dr Allen at bedside  

## 2024-03-18 NOTE — ED Notes (Signed)
 Provider at bedside

## 2024-03-19 DIAGNOSIS — K921 Melena: Secondary | ICD-10-CM

## 2024-03-19 DIAGNOSIS — D72829 Elevated white blood cell count, unspecified: Secondary | ICD-10-CM

## 2024-03-19 DIAGNOSIS — K7031 Alcoholic cirrhosis of liver with ascites: Secondary | ICD-10-CM

## 2024-03-19 DIAGNOSIS — I8500 Esophageal varices without bleeding: Secondary | ICD-10-CM

## 2024-03-19 DIAGNOSIS — I864 Gastric varices: Secondary | ICD-10-CM | POA: Diagnosis not present

## 2024-03-19 DIAGNOSIS — N179 Acute kidney failure, unspecified: Secondary | ICD-10-CM

## 2024-03-19 DIAGNOSIS — K7290 Hepatic failure, unspecified without coma: Secondary | ICD-10-CM | POA: Diagnosis not present

## 2024-03-19 DIAGNOSIS — K529 Noninfective gastroenteritis and colitis, unspecified: Principal | ICD-10-CM

## 2024-03-19 DIAGNOSIS — R1084 Generalized abdominal pain: Secondary | ICD-10-CM

## 2024-03-19 DIAGNOSIS — K7460 Unspecified cirrhosis of liver: Secondary | ICD-10-CM | POA: Diagnosis not present

## 2024-03-19 DIAGNOSIS — I81 Portal vein thrombosis: Secondary | ICD-10-CM | POA: Diagnosis not present

## 2024-03-19 LAB — PROTIME-INR
INR: 3.1 — ABNORMAL HIGH (ref 0.8–1.2)
Prothrombin Time: 32.4 s — ABNORMAL HIGH (ref 11.4–15.2)

## 2024-03-19 LAB — CBC
HCT: 24.6 % — ABNORMAL LOW (ref 39.0–52.0)
HCT: 25.1 % — ABNORMAL LOW (ref 39.0–52.0)
HCT: 27.3 % — ABNORMAL LOW (ref 39.0–52.0)
Hemoglobin: 8.5 g/dL — ABNORMAL LOW (ref 13.0–17.0)
Hemoglobin: 8.5 g/dL — ABNORMAL LOW (ref 13.0–17.0)
Hemoglobin: 8.8 g/dL — ABNORMAL LOW (ref 13.0–17.0)
MCH: 37.9 pg — ABNORMAL HIGH (ref 26.0–34.0)
MCH: 38.3 pg — ABNORMAL HIGH (ref 26.0–34.0)
MCH: 39 pg — ABNORMAL HIGH (ref 26.0–34.0)
MCHC: 32.2 g/dL (ref 30.0–36.0)
MCHC: 33.9 g/dL (ref 30.0–36.0)
MCHC: 34.6 g/dL (ref 30.0–36.0)
MCV: 112.1 fL — ABNORMAL HIGH (ref 80.0–100.0)
MCV: 112.8 fL — ABNORMAL HIGH (ref 80.0–100.0)
MCV: 118.7 fL — ABNORMAL HIGH (ref 80.0–100.0)
Platelets: 45 10*3/uL — ABNORMAL LOW (ref 150–400)
Platelets: 46 10*3/uL — ABNORMAL LOW (ref 150–400)
Platelets: 54 10*3/uL — ABNORMAL LOW (ref 150–400)
RBC: 2.18 MIL/uL — ABNORMAL LOW (ref 4.22–5.81)
RBC: 2.24 MIL/uL — ABNORMAL LOW (ref 4.22–5.81)
RBC: 2.3 MIL/uL — ABNORMAL LOW (ref 4.22–5.81)
RDW: 17.1 % — ABNORMAL HIGH (ref 11.5–15.5)
RDW: 17.3 % — ABNORMAL HIGH (ref 11.5–15.5)
RDW: 17.3 % — ABNORMAL HIGH (ref 11.5–15.5)
WBC: 10.9 10*3/uL — ABNORMAL HIGH (ref 4.0–10.5)
WBC: 12.6 10*3/uL — ABNORMAL HIGH (ref 4.0–10.5)
WBC: 14.8 10*3/uL — ABNORMAL HIGH (ref 4.0–10.5)
nRBC: 0.2 % (ref 0.0–0.2)
nRBC: 0.2 % (ref 0.0–0.2)
nRBC: 0.3 % — ABNORMAL HIGH (ref 0.0–0.2)

## 2024-03-19 LAB — HIV ANTIBODY (ROUTINE TESTING W REFLEX): HIV Screen 4th Generation wRfx: NONREACTIVE

## 2024-03-19 LAB — COMPREHENSIVE METABOLIC PANEL WITH GFR
ALT: 26 U/L (ref 0–44)
AST: 57 U/L — ABNORMAL HIGH (ref 15–41)
Albumin: 1.6 g/dL — ABNORMAL LOW (ref 3.5–5.0)
Alkaline Phosphatase: 119 U/L (ref 38–126)
Anion gap: 7 (ref 5–15)
BUN: 22 mg/dL — ABNORMAL HIGH (ref 6–20)
CO2: 27 mmol/L (ref 22–32)
Calcium: 8.1 mg/dL — ABNORMAL LOW (ref 8.9–10.3)
Chloride: 98 mmol/L (ref 98–111)
Creatinine, Ser: 1.23 mg/dL (ref 0.61–1.24)
GFR, Estimated: >60 mL/min (ref >60)
Glucose, Bld: 111 mg/dL — ABNORMAL HIGH (ref 70–99)
Potassium: 3.3 mmol/L — ABNORMAL LOW (ref 3.5–5.1)
Sodium: 132 mmol/L — ABNORMAL LOW (ref 135–145)
Total Bilirubin: 13.1 mg/dL — ABNORMAL HIGH (ref 0.0–1.2)
Total Protein: 5.6 g/dL — ABNORMAL LOW (ref 6.5–8.1)

## 2024-03-19 LAB — LACTATE DEHYDROGENASE: LDH: 144 U/L (ref 98–192)

## 2024-03-19 LAB — MAGNESIUM: Magnesium: 1.7 mg/dL (ref 1.7–2.4)

## 2024-03-19 LAB — GLUCOSE, CAPILLARY: Glucose-Capillary: 98 mg/dL (ref 70–99)

## 2024-03-19 NOTE — Consult Note (Signed)
 Consultation  Referring Provider:     Jacklynn Mask, MD Primary Care Physician:  Jerrlyn Morel, NP Primary Gastroenterologist: [facility] primarily, and also follows with Dr. Brice Campi        Reason for Consultation:     Abdominal pain, BRBPR         HPI:   Nathaniel Warren is a 58 y.o. male with a history of EtOH cirrhosis c/b ascites (quit EtOH 12/2021), esophageal varices, gastric varices, along with portal vein thrombosis (on Xarelto), depression/anxiety, bipolar disorder, tobacco use disorder, THC use, history of seizures, CVA, PTSD, presenting with abdominal pain and BRBPR.  He was last seen by Nathaniel Warren in the [facility] on 03/07/2024.  Outpatient MELD 25, CPT C.  Currently prescribed carvedilol 6.25 mg daily, Lasix 80 mg/day, amiloride 10 mg/day (did not tolerate spironolactone or eplerenone due to gynecomastia with both), lactulose, along with Xarelto to prevent further progression of PVT.  Not a candidate for TIPS.  Has been undergoing serial large-volume paracenteses with last paracentesis on 02/29/2024 with 3.1L removed.  Last EGD 07/2023 notable for grade 1-2 esophageal varices, type I gastric varices, portal hypertensive gastropathy.  Last colonoscopy was 07/2023 and notable for a 25 mm sigmoid adenoma, and 7 polyps measuring 2-10 mm (path: SSP's and HP's), along with pandiverticulosis and internal/external hemorrhoids.  He presented to the ER yesterday with c/o acute onset abdominal pain which started on 4/9 and progressively worsening at home.  Pain worse in the lower abdomen, but also a band of pain across higher abdomen and flanks.  Then developed BRBPR and some dark stool as well.  Stools are loose, but not watery with 3 BM/day (baseline 2-3/day).  Has not taken lactulose in many weeks.  Reports having fever of 101 at home.  Has had some nausea, but no emesis.  As above, he was recently started on Xarelto.  Admission evaluation notable for the  following: - WBC 18.2  --> 14.8 --> 12.6 - H/H 9.5/27.4 --> 8.5/24.6 (baseline Hgb ~10-11) - PLT 54 - NA 132, K3.3, p.o./creatinine 22/1.23 - Albumin 1.6, AST/ALT 57/26, ALP 119 - T. bili 13.1 (baseline ~9.9) - PT/INR 32.4/3.1 - Lactate 4.1--> 3.0 - FOBT positive - Normal ammonia, lipase - Blood cultures drawn and pending - CT A/P: Cirrhotic appearing liver without duct dilation.  Portal hypertensive changes with cavernous malformation of portal vein.  Cholelithiasis without cholecystitis.  Normal  pancreas.  Diffuse small and large bowel wall hypodense thickening and pericolonic fat stranding consistent with enterocolitis.  Interval increase in moderate volume ascites.  Was started on Protonix, Octreotide, Rocephin, Flagyl on admission.  Xarelto held.   Endoscopic History: EGD 07/10/2023 - No gross lesions in the proximal esophagus and in the mid esophagus. - Grade I and grade II esophageal varices noted distally. - Non-obstructing Schatzki ring. - 1 cm hiatal hernia. - Type 1 isolated gastric varices (IGV1, varices located in the fundus). - Portal hypertensive gastropathy. - Erosive gastropathy with no bleeding and no stigmata of recent bleeding. Biopsied. - No gross lesions in the duodenal bulb, in the first portion of the duodenum and in the second portion of the duodenum.  Colonoscopy 07/10/2023 - Hemorrhoids found on digital rectal exam. - There was significant looping of the colon. - Seven 2 to 10 mm polyps in the sigmoid colon, at the splenic flexure, at the hepatic flexure, in the ascending colon and in the cecum, removed with a cold snare. Resected and retrieved. - One  25 mm polyp in the sigmoid colon, removed with mucosal resection. Resected and retrieved. Clips (MR conditional) were placed. Tattooed. - Diverticulosis in the recto-sigmoid colon, in the sigmoid colon, in the descending colon and in the ascending colon. - Normal mucosa in the entire examined colon -  Non-bleeding non-thrombosed external and internal hemorrhoids   Past Medical History:  Diagnosis Date   Cirrhosis (HCC)    Depression    Drug overdose 10/20/2017   Drug overdose, intentional self-harm, initial encounter (HCC) 10/20/2017   Drug overdose, intentional, initial encounter (HCC) 10/20/2017   Hernia, abdominal    Liver failure (HCC)    Seizures (HCC)    Stroke (HCC)    Tobacco abuse     Past Surgical History:  Procedure Laterality Date   ANKLE SURGERY     ANKLE SURGERY Right    HERNIA REPAIR     INCISION AND DRAINAGE OF WOUND Left 02/03/2023   Procedure: IRRIGATION AND DEBRIDEMENT WOUND LEFT RING FINGER POSSIBLE AMPUTATION;  Surgeon: Ltanya Rummer, MD;  Location: [facility];  Service: Orthopedics;  Laterality: Left;   IR PARACENTESIS  11/12/2023   IR PARACENTESIS  12/01/2023   IR PARACENTESIS  02/29/2024   SPLENECTOMY      Family History  Problem Relation Age of Onset   COPD Mother    Diabetes Father    Stroke Maternal Grandmother    Alzheimer's disease Maternal Grandfather    Cancer Paternal Grandmother    Stomach cancer Paternal Grandfather    Hypertension Other    Colon cancer Neg Hx    Esophageal cancer Neg Hx    Colon polyps Neg Hx    Inflammatory bowel disease Neg Hx    Liver disease Neg Hx    Pancreatic cancer Neg Hx    Rectal cancer Neg Hx      Social History   Tobacco Use   Smoking status: Every Day    Current packs/day: 1.00    Average packs/day: 1 pack/day for 30.0 years (30.0 ttl pk-yrs)    Types: Cigarettes   Smokeless tobacco: Former  Building services engineer status: Never Used  Substance Use Topics   Alcohol use: Not Currently   Drug use: Yes    Types: Marijuana    Comment: last used 07/08/2023 - 1 bowl    Prior to Admission medications   Medication Sig Start Date End Date Taking? Authorizing Provider  aMILoride (MIDAMOR) 5 MG tablet Take 1 tablet (5 mg total) by mouth daily for 7 days, THEN 2 tablets (10 mg total) daily. 03/07/24      carvedilol (COREG) 3.125 MG tablet Take 2 tablets (6.25 mg total) by mouth nightly. 03/07/24     cyclobenzaprine (FLEXERIL) 5 MG tablet Take 1 tablet (5 mg total) by mouth 3 (three) times daily as needed for muscle spasms. Patient not taking: Reported on 02/04/2024 06/26/23   Nichols, Tonya S, NP  eplerenone (INSPRA) 25 MG tablet Take 2 tablets (50 mg total) by mouth daily. 12/29/23   Kennedy-Smith, Colleen M, NP  furosemide (LASIX) 40 MG tablet Take 1 tablet (40 mg total) by mouth daily. 05/27/23   Mansouraty, Gabriel Jr., MD  furosemide (LASIX) 40 MG tablet Take 1 tablet (40 mg total) by mouth 2 (two) times daily. Patient not taking: Reported on 02/04/2024 12/29/23   Kennedy-Smith, Colleen M, NP  furosemide (LASIX) 40 MG tablet Take 2 tablets (80 mg total) by mouth daily. 02/02/24     lactulose, encephalopathy, (CHRONULAC) 10 GM/15ML SOLN Take  30 mL (20 g total) by mouth 3 (three) times a day. 03/07/24     nadolol (CORGARD) 20 MG tablet Take 0.5 tablets (10 mg total) by mouth daily. Patient not taking: Reported on 02/04/2024 07/10/23   Mansouraty, Gabriel Jr., MD  nicotine (NICOTINE STEP 2) 14 mg/24hr patch Place 1 patch on the skin daily as needed (for nicotine craving if patient desires). 01/22/24     ondansetron (ZOFRAN) 4 MG tablet Take 1 tablet (4 mg total) by mouth every 8 (eight) hours as needed for nausea or vomiting. Patient not taking: Reported on 02/15/2024 05/27/23   Mansouraty, Gabriel Jr., MD  ondansetron (ZOFRAN-ODT) 4 MG disintegrating tablet Take 1 tablet (4 mg total) by mouth every 6 (six) hours as needed for vomiting. Patient not taking: Reported on 02/04/2024 10/26/23   Kennedy-Smith, Colleen M, NP  oxyCODONE (ROXICODONE) 5 MG immediate release tablet Take 1 tablet (5 mg total) by mouth every 4 (four) hours as needed for severe pain. Patient not taking: Reported on 02/04/2024 06/26/23   Jerrlyn Morel, NP  oxyCODONE (ROXICODONE) 5 MG immediate release tablet Take 1 tablet (5 mg total) by  mouth every 8 (eight) hours as needed for severe pain (pain score 7-10). 02/29/24   Paseda, Folashade R, FNP  PARoxetine (PAXIL) 10 MG tablet Take 1 tablet (10 mg total) by mouth daily. 01/22/24     PARoxetine (PAXIL) 20 MG tablet Take 1 tablet (20 mg total) by mouth daily. FURTHER REFILLS TO COME FROM PCP Patient taking differently: Take 20 mg by mouth daily. 05/27/23 11/18/23  Mansouraty, Albino Alu., MD  potassium chloride SA (KLOR-CON M) 20 MEQ tablet Take 2 tablets (40 mEq total) by mouth daily. Patient not taking: Reported on 02/15/2024 12/29/23   Kennedy-Smith, Colleen M, NP  QUEtiapine (SEROQUEL) 25 MG tablet TAKE 1 TABLET BY MOUTH AT BEDTIME NEED  OFFICE  VISIT 07/20/23   Nichols, Tonya S, NP  QUEtiapine (SEROQUEL) 25 MG tablet Take 1 tablet (25 mg total) by mouth at bedtime. Patient not taking: Reported on 02/04/2024 11/20/23   Nichols, Tonya S, NP  QUEtiapine (SEROQUEL) 25 MG tablet Take 1 tablet (25 mg total) by mouth nightly as needed (For insomnia, anxiety). 01/22/24     rivaroxaban (XARELTO) 20 MG TABS tablet Take 1 tablet (20 mg total) by mouth daily. 03/07/24     spironolactone (ALDACTONE) 50 MG tablet Take 1 tablet (50 mg total) by mouth in the morning. Patient not taking: Reported on 02/04/2024 05/27/23   Mansouraty, Gabriel Jr., MD  traMADol (ULTRAM) 50 MG tablet Take 50 mg by mouth every 6 (six) hours as needed for moderate pain (pain score 4-6). Patient not taking: Reported on 02/15/2024    [provider]    Current Facility-Administered Medications  Medication Dose Route Frequency Provider Last Rate Last Admin   acetaminophen (TYLENOL) tablet 325 mg  325 mg Oral Q6H PRN Opyd, Timothy S, MD       albumin human 25 % solution 50 g  50 g Intravenous Once PRN Opyd, Timothy S, MD       cefTRIAXone (ROCEPHIN) 2 g in sodium chloride 0.9 % 100 mL IVPB  2 g Intravenous Q24H Opyd, Timothy S, MD       fentaNYL (SUBLIMAZE) injection 12.5-50 mcg  12.5-50 mcg Intravenous Q2H PRN Opyd,  Timothy S, MD   50 mcg at 03/19/24 0523   octreotide (SANDOSTATIN) 500 mcg in sodium chloride 0.9 % 250 mL (2 mcg/mL) infusion  50 mcg/hr Intravenous  Continuous Carin Charleston, MD 25 mL/hr at 03/19/24 0730 50 mcg/hr at 03/19/24 0730   ondansetron (ZOFRAN) tablet 4 mg  4 mg Oral Q6H PRN Opyd, Santana Cue, MD       Or   ondansetron (ZOFRAN) injection 4 mg  4 mg Intravenous Q6H PRN Opyd, Santana Cue, MD       pantoprazole (PROTONIX) injection 40 mg  40 mg Intravenous Q12H Opyd, Santana Cue, MD   40 mg at 03/19/24 4098   QUEtiapine (SEROQUEL) tablet 25 mg  25 mg Oral QHS Opyd, Santana Cue, MD   25 mg at 03/18/24 2212   sodium chloride flush (NS) 0.9 % injection 3 mL  3 mL Intravenous Q12H Opyd, Santana Cue, MD   3 mL at 03/18/24 2130    Allergies as of 03/18/2024 - Review Complete 03/18/2024  Allergen Reaction Noted   Nsaids Other (See Comments) 10/08/2023     Review of Systems:    As per HPI, otherwise negative    Physical Exam:  Vital signs in last 24 hours: Temp:  [98.2 F (36.8 C)-99.3 F (37.4 C)] 98.6 F (37 C) (04/12 0521) Pulse Rate:  [81-92] 85 (04/12 0521) Resp:  [9-20] 19 (04/12 0521) BP: (94-122)/(41-77) 112/61 (04/12 0521) SpO2:  [92 %-100 %] 92 % (04/12 0521) Weight:  [86.1 kg] 86.1 kg (04/11 2031) Last BM Date : 03/18/24 General:   Pleasant male in NAD.  Sleepy but arousable and conversive Lungs:  Respirations even and unlabored. Lungs clear to auscultation bilaterally.   No wheezes, crackles, or rhonchi.  Heart:  Regular rate and rhythm; no MRG Abdomen: Distended but soft.  TTP in lower abdomen without peritoneal signs.   Normal bowel sounds. Neurologic:  Alert and  oriented x4 Skin:  Intact without significant lesions or rashes. Psych:  Alert and cooperative. Normal affect.  LAB RESULTS: Recent Labs    03/18/24 1439 03/18/24 1503 03/18/24 2349 03/19/24 0534  WBC 18.2*  --  14.8* 12.6*  HGB 9.5* 10.5* 8.5* 8.5*  HCT 27.4* 31.0* 25.1* 24.6*  PLT 55*  --  45* 54*    BMET Recent Labs    03/18/24 1439 03/18/24 1503 03/19/24 0534  NA 131* 136 132*  K 3.0* 3.0* 3.3*  CL 96* 97* 98  CO2 24  --  27  GLUCOSE 96 92 111*  BUN 20 19 22*  CREATININE 1.25* 1.30* 1.23  CALCIUM 8.4*  --  8.1*   LFT Recent Labs    03/19/24 0534  PROT 5.6*  ALBUMIN 1.6*  AST 57*  ALT 26  ALKPHOS 119  BILITOT 13.1*   PT/INR Recent Labs    03/18/24 1439 03/19/24 0534  LABPROT 30.5* 32.4*  INR 2.9* 3.1*    STUDIES: CT ABDOMEN PELVIS W CONTRAST Result Date: 03/18/2024 CLINICAL DATA:  Abdominal pain, acute, nonlocalized abd pain with bil flank pain, Pt is on Liver transplant list, states he has not been able to eat due to abd pain, orthostatic. Pt states he has been having blood in stools that are now black in color. Pt is jaundice EXAM: CT ABDOMEN AND PELVIS WITH CONTRAST TECHNIQUE: Multidetector CT imaging of the abdomen and pelvis was performed using the standard protocol following bolus administration of intravenous contrast. RADIATION DOSE REDUCTION: This exam was performed according to the departmental dose-optimization program which includes automated exposure control, adjustment of the mA and/or kV according to patient size and/or use of iterative reconstruction technique. CONTRAST:  100mL OMNIPAQUE IOHEXOL 300 MG/ML  SOLN  COMPARISON:  CT angiography chest 02/05/2024, CT renal 06/08/2023, CT abdomen pelvis 08/05/2023 FINDINGS: Lower chest: Small left pleural effusion. Hepatobiliary: Nodular hepatic contour. No focal liver abnormality. Calcified gallstone noted within the gallbladder lumen. No gallbladder wall thickening or pericholecystic fluid. No biliary dilatation. Pancreas: No focal lesion. Normal pancreatic contour. No surrounding inflammatory changes. No main pancreatic ductal dilatation. Spleen: Likely splenosis within the left upper quadrant. Adrenals/Urinary Tract: No adrenal nodule bilaterally. Bilateral kidneys enhance symmetrically. No hydronephrosis. No  hydroureter. The urinary bladder is unremarkable. Stomach/Bowel: Stomach is within normal limits. No evidence of bowel wall dilatation. Diffuse small and large bowel wall hypodense thickening and pericolonic fat stranding. Colonic diverticulosis. Appendix appears normal. Vascular/Lymphatic: Venous collaterals noted. The main portal vein is not identified with multiple venous collaterals in the porta hepatics consistent with cavernous malformation of the portal vein. No abdominal aorta or iliac aneurysm. Severe atherosclerotic plaque of the aorta and its branches. No abdominal, pelvic, or inguinal lymphadenopathy. Reproductive: Prostate is unremarkable. Other: Interval increase in moderate volume free fluid. No intraperitoneal free gas. No organized fluid collection. Musculoskeletal: No abdominal wall hernia or abnormality. Couple tiny fluid-filled supraumbilical ventral hernias again noted (3:40). No suspicious lytic or blastic osseous lesions. No acute displaced fracture. Multilevel degenerative changes of the spine. IMPRESSION: 1. Enterocolitis. 2. Colonic diverticulosis with no acute diverticulitis. 3. Cirrhosis with portal hypertension and cavernous malformation of the portal vein. 4. Interval increase in moderate volume simple free fluid ascites. 5. Cholelithiasis with no CT evidence of acute cholecystitis. 6.  Couple tiny fluid-filled supraumbilical ventral hernias. Electronically Signed   By: Morgane  Naveau M.D.   On: 03/18/2024 17:31       Impression / Plan:   1) EtOH cirrhosis 2) Ascites 3) Esophageal and gastric varices 58 year old male with history of decompensated EtOH cirrhosis.  Current MELD 34, although INR might be slightly higher than expected due to recent initiation of anticoagulation.  - IR consult for paracentesis.  Please send fluid for cell count, Gram stain, culture, albumin, protein.  Limit to 3 L removed - Continue Rocephin - IV albumin with paracentesis - Prescribed Lasix  and amiloride as outpatient (developed gynecomastia with both spironolactone and eplerenone) - Continue octreotide - Continue PPI  4) Portal vein thrombosis with cavernous transformation - Holding Xarelto  5) Hematochezia 6) Abdominal pain 7) Leukocytosis-improving No nausea/vomiting/hematemesis, but does report both BRBPR and dark stool. BUN above baseline, but difficult to interpret in setting of concomitant mild AKI.  H/H about 1 gm below baseline on arrival, with 1 gm decline overnight, but that could be dilutional. Possibly infectious etiology.  CT on admission also notable for features of enterocolitis.  - Agree with starting antibiotics on admission - Follow-up pending cultures - Trend serial CBCs - May need to consider EGD.  Will make n.p.o. at midnight for tentative EGD tomorrow - Check stool culture - IR consult for paracentesis as above  8) Thrombocytopenia 2/2 decompensated cirrhosis, with possibly some decline below baseline due to infectious etiology.  Continue monitoring  9) AKI 10) Hypokalemia - Renal function stable/slightly improved this morning - Continue monitoring closely with electrolyte repletion as needed per protocol - IV albumin with paracentesis today - Holding diuretics for the time being   MELD 3.0: 34 at 03/19/2024  5:34 AM MELD-Na: 32 at 03/19/2024  5:34 AM Calculated from: Serum Creatinine: 1.23 mg/dL at 9/60/4540  9:81 AM Serum Sodium: 132 mmol/L at 03/19/2024  5:34 AM Total Bilirubin: 13.1 mg/dL at 1/91/4782  9:56 AM  Serum Albumin: 1.6 g/dL at 08/21/7828  5:62 AM INR(ratio): 3.1 at 03/19/2024  5:34 AM Age at listing (hypothetical): 58 years Sex: Male at 03/19/2024  5:34 AM    [name], DO, [facility]    LOS: 1 day   Annis Kinder  03/19/2024, 8:03 AM

## 2024-03-19 NOTE — Progress Notes (Signed)
  Progress Note   Patient: Nathaniel Warren ZOX:[id number] DOB: Jan 11, 1966 DOA: 03/18/2024     1 DOS: the patient was seen and examined on 03/19/2024   Brief hospital course: No notes on file  Assessment and Plan: No notes have been filed under this hospital service. Service: Hospitalist     {Tip this will not be part of the note when signed Body mass index is 28.03 kg/m. , ,  Active Pressure Injury/Wound(s)     Pressure Ulcer  Duration          Pressure Injury 02/09/23 Buttocks Left Stage 2 -  Partial thickness loss of dermis presenting as a shallow open injury with a red, pink wound bed without slough. 403 days           (Optional):26781}  Subjective: ***  Physical Exam: Vitals:   03/19/24 0052 03/19/24 0521 03/19/24 0931 03/19/24 1341  BP: (!) 97/50 112/61 (!) 95/55 (!) 92/56  Pulse: 87 85 77 75  Resp: 18 19 18 16   Temp: 98.2 F (36.8 C) 98.6 F (37 C) 98.5 F (36.9 C) 98.2 F (36.8 C)  TempSrc: Oral Oral Oral Oral  SpO2: 92% 92% 94% 99%  Weight:      Height:       *** Data Reviewed: {Tip this will not be part of the note when signed- Document your independent interpretation of telemetry tracing, EKG, lab, Radiology test or any other diagnostic tests. Add any new diagnostic test ordered today. (Optional):26781} {Results:26384}  Family Communication: ***  Disposition: Status is: Inpatient {Inpatient:23812}  Planned Discharge Destination: {DISCHARGE DESTINATION_TRH:27031} {Tip this will not be part of the note when signed  DVT Prophylaxis  ., Scds  (Optional):26781}   Time spent: *** minutes  Author: Othell Jaime, DO 03/19/2024 4:45 PM  For on call review [email or web address].

## 2024-03-19 NOTE — H&P (View-Only) (Signed)
 Consultation  Referring Provider:     Jacklynn Mask, MD Primary Care Physician:  Jerrlyn Morel, NP Primary Gastroenterologist: [facility] primarily, and also follows with Dr. Brice Campi        Reason for Consultation:     Abdominal pain, BRBPR         HPI:   Nathaniel Warren is a 58 y.o. male with a history of EtOH cirrhosis c/b ascites (quit EtOH 12/2021), esophageal varices, gastric varices, along with portal vein thrombosis (on Xarelto), depression/anxiety, bipolar disorder, tobacco use disorder, THC use, history of seizures, CVA, PTSD, presenting with abdominal pain and BRBPR.  He was last seen by Duey Ghent in the [facility] on 03/07/2024.  Outpatient MELD 25, CPT C.  Currently prescribed carvedilol 6.25 mg daily, Lasix 80 mg/day, amiloride 10 mg/day (did not tolerate spironolactone or eplerenone due to gynecomastia with both), lactulose, along with Xarelto to prevent further progression of PVT.  Not a candidate for TIPS.  Has been undergoing serial large-volume paracenteses with last paracentesis on 02/29/2024 with 3.1L removed.  Last EGD 07/2023 notable for grade 1-2 esophageal varices, type I gastric varices, portal hypertensive gastropathy.  Last colonoscopy was 07/2023 and notable for a 25 mm sigmoid adenoma, and 7 polyps measuring 2-10 mm (path: SSP's and HP's), along with pandiverticulosis and internal/external hemorrhoids.  He presented to the ER yesterday with c/o acute onset abdominal pain which started on 4/9 and progressively worsening at home.  Pain worse in the lower abdomen, but also a band of pain across higher abdomen and flanks.  Then developed BRBPR and some dark stool as well.  Stools are loose, but not watery with 3 BM/day (baseline 2-3/day).  Has not taken lactulose in many weeks.  Reports having fever of 101 at home.  Has had some nausea, but no emesis.  As above, he was recently started on Xarelto.  Admission evaluation notable for the  following: - WBC 18.2  --> 14.8 --> 12.6 - H/H 9.5/27.4 --> 8.5/24.6 (baseline Hgb ~10-11) - PLT 54 - NA 132, K3.3, p.o./creatinine 22/1.23 - Albumin 1.6, AST/ALT 57/26, ALP 119 - T. bili 13.1 (baseline ~9.9) - PT/INR 32.4/3.1 - Lactate 4.1--> 3.0 - FOBT positive - Normal ammonia, lipase - Blood cultures drawn and pending - CT A/P: Cirrhotic appearing liver without duct dilation.  Portal hypertensive changes with cavernous malformation of portal vein.  Cholelithiasis without cholecystitis.  Normal  pancreas.  Diffuse small and large bowel wall hypodense thickening and pericolonic fat stranding consistent with enterocolitis.  Interval increase in moderate volume ascites.  Was started on Protonix, Octreotide, Rocephin, Flagyl on admission.  Xarelto held.   Endoscopic History: EGD 07/10/2023 - No gross lesions in the proximal esophagus and in the mid esophagus. - Grade I and grade II esophageal varices noted distally. - Non-obstructing Schatzki ring. - 1 cm hiatal hernia. - Type 1 isolated gastric varices (IGV1, varices located in the fundus). - Portal hypertensive gastropathy. - Erosive gastropathy with no bleeding and no stigmata of recent bleeding. Biopsied. - No gross lesions in the duodenal bulb, in the first portion of the duodenum and in the second portion of the duodenum.  Colonoscopy 07/10/2023 - Hemorrhoids found on digital rectal exam. - There was significant looping of the colon. - Seven 2 to 10 mm polyps in the sigmoid colon, at the splenic flexure, at the hepatic flexure, in the ascending colon and in the cecum, removed with a cold snare. Resected and retrieved. - One  25 mm polyp in the sigmoid colon, removed with mucosal resection. Resected and retrieved. Clips (MR conditional) were placed. Tattooed. - Diverticulosis in the recto-sigmoid colon, in the sigmoid colon, in the descending colon and in the ascending colon. - Normal mucosa in the entire examined colon -  Non-bleeding non-thrombosed external and internal hemorrhoids   Past Medical History:  Diagnosis Date   Cirrhosis (HCC)    Depression    Drug overdose 10/20/2017   Drug overdose, intentional self-harm, initial encounter (HCC) 10/20/2017   Drug overdose, intentional, initial encounter (HCC) 10/20/2017   Hernia, abdominal    Liver failure (HCC)    Seizures (HCC)    Stroke (HCC)    Tobacco abuse     Past Surgical History:  Procedure Laterality Date   ANKLE SURGERY     ANKLE SURGERY Right    HERNIA REPAIR     INCISION AND DRAINAGE OF WOUND Left 02/03/2023   Procedure: IRRIGATION AND DEBRIDEMENT WOUND LEFT RING FINGER POSSIBLE AMPUTATION;  Surgeon: Ltanya Rummer, MD;  Location: [facility];  Service: Orthopedics;  Laterality: Left;   IR PARACENTESIS  11/12/2023   IR PARACENTESIS  12/01/2023   IR PARACENTESIS  02/29/2024   SPLENECTOMY      Family History  Problem Relation Age of Onset   COPD Mother    Diabetes Father    Stroke Maternal Grandmother    Alzheimer's disease Maternal Grandfather    Cancer Paternal Grandmother    Stomach cancer Paternal Grandfather    Hypertension Other    Colon cancer Neg Hx    Esophageal cancer Neg Hx    Colon polyps Neg Hx    Inflammatory bowel disease Neg Hx    Liver disease Neg Hx    Pancreatic cancer Neg Hx    Rectal cancer Neg Hx      Social History   Tobacco Use   Smoking status: Every Day    Current packs/day: 1.00    Average packs/day: 1 pack/day for 30.0 years (30.0 ttl pk-yrs)    Types: Cigarettes   Smokeless tobacco: Former  Building services engineer status: Never Used  Substance Use Topics   Alcohol use: Not Currently   Drug use: Yes    Types: Marijuana    Comment: last used 07/08/2023 - 1 bowl    Prior to Admission medications   Medication Sig Start Date End Date Taking? Authorizing Provider  aMILoride (MIDAMOR) 5 MG tablet Take 1 tablet (5 mg total) by mouth daily for 7 days, THEN 2 tablets (10 mg total) daily. 03/07/24      carvedilol (COREG) 3.125 MG tablet Take 2 tablets (6.25 mg total) by mouth nightly. 03/07/24     cyclobenzaprine (FLEXERIL) 5 MG tablet Take 1 tablet (5 mg total) by mouth 3 (three) times daily as needed for muscle spasms. Patient not taking: Reported on 02/04/2024 06/26/23   Nichols, Tonya S, NP  eplerenone (INSPRA) 25 MG tablet Take 2 tablets (50 mg total) by mouth daily. 12/29/23   Kennedy-Smith, Colleen M, NP  furosemide (LASIX) 40 MG tablet Take 1 tablet (40 mg total) by mouth daily. 05/27/23   Mansouraty, Gabriel Jr., MD  furosemide (LASIX) 40 MG tablet Take 1 tablet (40 mg total) by mouth 2 (two) times daily. Patient not taking: Reported on 02/04/2024 12/29/23   Kennedy-Smith, Colleen M, NP  furosemide (LASIX) 40 MG tablet Take 2 tablets (80 mg total) by mouth daily. 02/02/24     lactulose, encephalopathy, (CHRONULAC) 10 GM/15ML SOLN Take  30 mL (20 g total) by mouth 3 (three) times a day. 03/07/24     nadolol (CORGARD) 20 MG tablet Take 0.5 tablets (10 mg total) by mouth daily. Patient not taking: Reported on 02/04/2024 07/10/23   Mansouraty, Gabriel Jr., MD  nicotine (NICOTINE STEP 2) 14 mg/24hr patch Place 1 patch on the skin daily as needed (for nicotine craving if patient desires). 01/22/24     ondansetron (ZOFRAN) 4 MG tablet Take 1 tablet (4 mg total) by mouth every 8 (eight) hours as needed for nausea or vomiting. Patient not taking: Reported on 02/15/2024 05/27/23   Mansouraty, Gabriel Jr., MD  ondansetron (ZOFRAN-ODT) 4 MG disintegrating tablet Take 1 tablet (4 mg total) by mouth every 6 (six) hours as needed for vomiting. Patient not taking: Reported on 02/04/2024 10/26/23   Kennedy-Smith, Colleen M, NP  oxyCODONE (ROXICODONE) 5 MG immediate release tablet Take 1 tablet (5 mg total) by mouth every 4 (four) hours as needed for severe pain. Patient not taking: Reported on 02/04/2024 06/26/23   Jerrlyn Morel, NP  oxyCODONE (ROXICODONE) 5 MG immediate release tablet Take 1 tablet (5 mg total) by  mouth every 8 (eight) hours as needed for severe pain (pain score 7-10). 02/29/24   Paseda, Folashade R, FNP  PARoxetine (PAXIL) 10 MG tablet Take 1 tablet (10 mg total) by mouth daily. 01/22/24     PARoxetine (PAXIL) 20 MG tablet Take 1 tablet (20 mg total) by mouth daily. FURTHER REFILLS TO COME FROM PCP Patient taking differently: Take 20 mg by mouth daily. 05/27/23 11/18/23  Mansouraty, Albino Alu., MD  potassium chloride SA (KLOR-CON M) 20 MEQ tablet Take 2 tablets (40 mEq total) by mouth daily. Patient not taking: Reported on 02/15/2024 12/29/23   Kennedy-Smith, Colleen M, NP  QUEtiapine (SEROQUEL) 25 MG tablet TAKE 1 TABLET BY MOUTH AT BEDTIME NEED  OFFICE  VISIT 07/20/23   Nichols, Tonya S, NP  QUEtiapine (SEROQUEL) 25 MG tablet Take 1 tablet (25 mg total) by mouth at bedtime. Patient not taking: Reported on 02/04/2024 11/20/23   Nichols, Tonya S, NP  QUEtiapine (SEROQUEL) 25 MG tablet Take 1 tablet (25 mg total) by mouth nightly as needed (For insomnia, anxiety). 01/22/24     rivaroxaban (XARELTO) 20 MG TABS tablet Take 1 tablet (20 mg total) by mouth daily. 03/07/24     spironolactone (ALDACTONE) 50 MG tablet Take 1 tablet (50 mg total) by mouth in the morning. Patient not taking: Reported on 02/04/2024 05/27/23   Mansouraty, Gabriel Jr., MD  traMADol (ULTRAM) 50 MG tablet Take 50 mg by mouth every 6 (six) hours as needed for moderate pain (pain score 4-6). Patient not taking: Reported on 02/15/2024    [provider]    Current Facility-Administered Medications  Medication Dose Route Frequency Provider Last Rate Last Admin   acetaminophen (TYLENOL) tablet 325 mg  325 mg Oral Q6H PRN Opyd, Timothy S, MD       albumin human 25 % solution 50 g  50 g Intravenous Once PRN Opyd, Timothy S, MD       cefTRIAXone (ROCEPHIN) 2 g in sodium chloride 0.9 % 100 mL IVPB  2 g Intravenous Q24H Opyd, Timothy S, MD       fentaNYL (SUBLIMAZE) injection 12.5-50 mcg  12.5-50 mcg Intravenous Q2H PRN Opyd,  Timothy S, MD   50 mcg at 03/19/24 0523   octreotide (SANDOSTATIN) 500 mcg in sodium chloride 0.9 % 250 mL (2 mcg/mL) infusion  50 mcg/hr Intravenous  Continuous Carin Charleston, MD 25 mL/hr at 03/19/24 0730 50 mcg/hr at 03/19/24 0730   ondansetron (ZOFRAN) tablet 4 mg  4 mg Oral Q6H PRN Opyd, Santana Cue, MD       Or   ondansetron (ZOFRAN) injection 4 mg  4 mg Intravenous Q6H PRN Opyd, Santana Cue, MD       pantoprazole (PROTONIX) injection 40 mg  40 mg Intravenous Q12H Opyd, Santana Cue, MD   40 mg at 03/19/24 4098   QUEtiapine (SEROQUEL) tablet 25 mg  25 mg Oral QHS Opyd, Santana Cue, MD   25 mg at 03/18/24 2212   sodium chloride flush (NS) 0.9 % injection 3 mL  3 mL Intravenous Q12H Opyd, Santana Cue, MD   3 mL at 03/18/24 2130    Allergies as of 03/18/2024 - Review Complete 03/18/2024  Allergen Reaction Noted   Nsaids Other (See Comments) 10/08/2023     Review of Systems:    As per HPI, otherwise negative    Physical Exam:  Vital signs in last 24 hours: Temp:  [98.2 F (36.8 C)-99.3 F (37.4 C)] 98.6 F (37 C) (04/12 0521) Pulse Rate:  [81-92] 85 (04/12 0521) Resp:  [9-20] 19 (04/12 0521) BP: (94-122)/(41-77) 112/61 (04/12 0521) SpO2:  [92 %-100 %] 92 % (04/12 0521) Weight:  [86.1 kg] 86.1 kg (04/11 2031) Last BM Date : 03/18/24 General:   Pleasant male in NAD.  Sleepy but arousable and conversive Lungs:  Respirations even and unlabored. Lungs clear to auscultation bilaterally.   No wheezes, crackles, or rhonchi.  Heart:  Regular rate and rhythm; no MRG Abdomen: Distended but soft.  TTP in lower abdomen without peritoneal signs.   Normal bowel sounds. Neurologic:  Alert and  oriented x4 Skin:  Intact without significant lesions or rashes. Psych:  Alert and cooperative. Normal affect.  LAB RESULTS: Recent Labs    03/18/24 1439 03/18/24 1503 03/18/24 2349 03/19/24 0534  WBC 18.2*  --  14.8* 12.6*  HGB 9.5* 10.5* 8.5* 8.5*  HCT 27.4* 31.0* 25.1* 24.6*  PLT 55*  --  45* 54*    BMET Recent Labs    03/18/24 1439 03/18/24 1503 03/19/24 0534  NA 131* 136 132*  K 3.0* 3.0* 3.3*  CL 96* 97* 98  CO2 24  --  27  GLUCOSE 96 92 111*  BUN 20 19 22*  CREATININE 1.25* 1.30* 1.23  CALCIUM 8.4*  --  8.1*   LFT Recent Labs    03/19/24 0534  PROT 5.6*  ALBUMIN 1.6*  AST 57*  ALT 26  ALKPHOS 119  BILITOT 13.1*   PT/INR Recent Labs    03/18/24 1439 03/19/24 0534  LABPROT 30.5* 32.4*  INR 2.9* 3.1*    STUDIES: CT ABDOMEN PELVIS W CONTRAST Result Date: 03/18/2024 CLINICAL DATA:  Abdominal pain, acute, nonlocalized abd pain with bil flank pain, Pt is on Liver transplant list, states he has not been able to eat due to abd pain, orthostatic. Pt states he has been having blood in stools that are now black in color. Pt is jaundice EXAM: CT ABDOMEN AND PELVIS WITH CONTRAST TECHNIQUE: Multidetector CT imaging of the abdomen and pelvis was performed using the standard protocol following bolus administration of intravenous contrast. RADIATION DOSE REDUCTION: This exam was performed according to the departmental dose-optimization program which includes automated exposure control, adjustment of the mA and/or kV according to patient size and/or use of iterative reconstruction technique. CONTRAST:  100mL OMNIPAQUE IOHEXOL 300 MG/ML  SOLN  COMPARISON:  CT angiography chest 02/05/2024, CT renal 06/08/2023, CT abdomen pelvis 08/05/2023 FINDINGS: Lower chest: Small left pleural effusion. Hepatobiliary: Nodular hepatic contour. No focal liver abnormality. Calcified gallstone noted within the gallbladder lumen. No gallbladder wall thickening or pericholecystic fluid. No biliary dilatation. Pancreas: No focal lesion. Normal pancreatic contour. No surrounding inflammatory changes. No main pancreatic ductal dilatation. Spleen: Likely splenosis within the left upper quadrant. Adrenals/Urinary Tract: No adrenal nodule bilaterally. Bilateral kidneys enhance symmetrically. No hydronephrosis. No  hydroureter. The urinary bladder is unremarkable. Stomach/Bowel: Stomach is within normal limits. No evidence of bowel wall dilatation. Diffuse small and large bowel wall hypodense thickening and pericolonic fat stranding. Colonic diverticulosis. Appendix appears normal. Vascular/Lymphatic: Venous collaterals noted. The main portal vein is not identified with multiple venous collaterals in the porta hepatics consistent with cavernous malformation of the portal vein. No abdominal aorta or iliac aneurysm. Severe atherosclerotic plaque of the aorta and its branches. No abdominal, pelvic, or inguinal lymphadenopathy. Reproductive: Prostate is unremarkable. Other: Interval increase in moderate volume free fluid. No intraperitoneal free gas. No organized fluid collection. Musculoskeletal: No abdominal wall hernia or abnormality. Couple tiny fluid-filled supraumbilical ventral hernias again noted (3:40). No suspicious lytic or blastic osseous lesions. No acute displaced fracture. Multilevel degenerative changes of the spine. IMPRESSION: 1. Enterocolitis. 2. Colonic diverticulosis with no acute diverticulitis. 3. Cirrhosis with portal hypertension and cavernous malformation of the portal vein. 4. Interval increase in moderate volume simple free fluid ascites. 5. Cholelithiasis with no CT evidence of acute cholecystitis. 6.  Couple tiny fluid-filled supraumbilical ventral hernias. Electronically Signed   By: Morgane  Naveau M.D.   On: 03/18/2024 17:31       Impression / Plan:   1) EtOH cirrhosis 2) Ascites 3) Esophageal and gastric varices 58 year old male with history of decompensated EtOH cirrhosis.  Current MELD 34, although INR might be slightly higher than expected due to recent initiation of anticoagulation.  - IR consult for paracentesis.  Please send fluid for cell count, Gram stain, culture, albumin, protein.  Limit to 3 L removed - Continue Rocephin - IV albumin with paracentesis - Prescribed Lasix  and amiloride as outpatient (developed gynecomastia with both spironolactone and eplerenone) - Continue octreotide - Continue PPI  4) Portal vein thrombosis with cavernous transformation - Holding Xarelto  5) Hematochezia 6) Abdominal pain 7) Leukocytosis-improving No nausea/vomiting/hematemesis, but does report both BRBPR and dark stool. BUN above baseline, but difficult to interpret in setting of concomitant mild AKI.  H/H about 1 gm below baseline on arrival, with 1 gm decline overnight, but that could be dilutional. Possibly infectious etiology.  CT on admission also notable for features of enterocolitis.  - Agree with starting antibiotics on admission - Follow-up pending cultures - Trend serial CBCs - May need to consider EGD.  Will make n.p.o. at midnight for tentative EGD tomorrow - Check stool culture - IR consult for paracentesis as above  8) Thrombocytopenia 2/2 decompensated cirrhosis, with possibly some decline below baseline due to infectious etiology.  Continue monitoring  9) AKI 10) Hypokalemia - Renal function stable/slightly improved this morning - Continue monitoring closely with electrolyte repletion as needed per protocol - IV albumin with paracentesis today - Holding diuretics for the time being   MELD 3.0: 34 at 03/19/2024  5:34 AM MELD-Na: 32 at 03/19/2024  5:34 AM Calculated from: Serum Creatinine: 1.23 mg/dL at 9/60/4540  9:81 AM Serum Sodium: 132 mmol/L at 03/19/2024  5:34 AM Total Bilirubin: 13.1 mg/dL at 1/91/4782  9:56 AM  Serum Albumin: 1.6 g/dL at 08/21/7828  5:62 AM INR(ratio): 3.1 at 03/19/2024  5:34 AM Age at listing (hypothetical): 58 years Sex: Male at 03/19/2024  5:34 AM    [name], DO, [facility]    LOS: 1 day   Annis Kinder  03/19/2024, 8:03 AM

## 2024-03-20 ENCOUNTER — Encounter (HOSPITAL_COMMUNITY)
Admission: EM | Disposition: A | Discharge status: Home / Self Care | Payer: Self-pay | Source: Home / Self Care | Attending: Internal Medicine

## 2024-03-20 ENCOUNTER — Inpatient Hospital Stay (HOSPITAL_COMMUNITY): Admitting: Anesthesiology

## 2024-03-20 ENCOUNTER — Encounter (HOSPITAL_COMMUNITY): Payer: Self-pay | Admitting: Family Medicine

## 2024-03-20 DIAGNOSIS — K766 Portal hypertension: Secondary | ICD-10-CM

## 2024-03-20 DIAGNOSIS — I10 Essential (primary) hypertension: Secondary | ICD-10-CM | POA: Diagnosis not present

## 2024-03-20 DIAGNOSIS — F17210 Nicotine dependence, cigarettes, uncomplicated: Secondary | ICD-10-CM

## 2024-03-20 DIAGNOSIS — I8510 Secondary esophageal varices without bleeding: Secondary | ICD-10-CM

## 2024-03-20 DIAGNOSIS — K7290 Hepatic failure, unspecified without coma: Secondary | ICD-10-CM | POA: Diagnosis not present

## 2024-03-20 DIAGNOSIS — K7460 Unspecified cirrhosis of liver: Secondary | ICD-10-CM

## 2024-03-20 DIAGNOSIS — I8500 Esophageal varices without bleeding: Secondary | ICD-10-CM | POA: Diagnosis not present

## 2024-03-20 DIAGNOSIS — K921 Melena: Secondary | ICD-10-CM | POA: Diagnosis not present

## 2024-03-20 DIAGNOSIS — K3189 Other diseases of stomach and duodenum: Secondary | ICD-10-CM

## 2024-03-20 HISTORY — PX: ESOPHAGOGASTRODUODENOSCOPY: SHX5428

## 2024-03-20 LAB — COMPREHENSIVE METABOLIC PANEL WITH GFR
ALT: 22 U/L (ref 0–44)
AST: 60 U/L — ABNORMAL HIGH (ref 15–41)
Albumin: 1.5 g/dL — ABNORMAL LOW (ref 3.5–5.0)
Alkaline Phosphatase: 121 U/L (ref 38–126)
Anion gap: 5 (ref 5–15)
BUN: 20 mg/dL (ref 6–20)
CO2: 27 mmol/L (ref 22–32)
Calcium: 7.7 mg/dL — ABNORMAL LOW (ref 8.9–10.3)
Chloride: 101 mmol/L (ref 98–111)
Creatinine, Ser: 1.13 mg/dL (ref 0.61–1.24)
GFR, Estimated: >60 mL/min (ref >60)
Glucose, Bld: 115 mg/dL — ABNORMAL HIGH (ref 70–99)
Potassium: 3.3 mmol/L — ABNORMAL LOW (ref 3.5–5.1)
Sodium: 133 mmol/L — ABNORMAL LOW (ref 135–145)
Total Bilirubin: 11.9 mg/dL — ABNORMAL HIGH (ref 0.0–1.2)
Total Protein: 5.5 g/dL — ABNORMAL LOW (ref 6.5–8.1)

## 2024-03-20 LAB — CBC
HCT: 24.1 % — ABNORMAL LOW (ref 39.0–52.0)
HCT: 25 % — ABNORMAL LOW (ref 39.0–52.0)
Hemoglobin: 8.1 g/dL — ABNORMAL LOW (ref 13.0–17.0)
Hemoglobin: 8.3 g/dL — ABNORMAL LOW (ref 13.0–17.0)
MCH: 38.6 pg — ABNORMAL HIGH (ref 26.0–34.0)
MCH: 38.6 pg — ABNORMAL HIGH (ref 26.0–34.0)
MCHC: 33.2 g/dL (ref 30.0–36.0)
MCHC: 33.6 g/dL (ref 30.0–36.0)
MCV: 114.8 fL — ABNORMAL HIGH (ref 80.0–100.0)
MCV: 116.3 fL — ABNORMAL HIGH (ref 80.0–100.0)
Platelets: 49 10*3/uL — ABNORMAL LOW (ref 150–400)
Platelets: 53 10*3/uL — ABNORMAL LOW (ref 150–400)
RBC: 2.1 MIL/uL — ABNORMAL LOW (ref 4.22–5.81)
RBC: 2.15 MIL/uL — ABNORMAL LOW (ref 4.22–5.81)
RDW: 17 % — ABNORMAL HIGH (ref 11.5–15.5)
RDW: 17.1 % — ABNORMAL HIGH (ref 11.5–15.5)
WBC: 11.2 10*3/uL — ABNORMAL HIGH (ref 4.0–10.5)
WBC: 9.6 10*3/uL (ref 4.0–10.5)
nRBC: 0.3 % — ABNORMAL HIGH (ref 0.0–0.2)
nRBC: 0.4 % — ABNORMAL HIGH (ref 0.0–0.2)

## 2024-03-20 LAB — PROTIME-INR
INR: 2.8 — ABNORMAL HIGH (ref 0.8–1.2)
Prothrombin Time: 29.9 s — ABNORMAL HIGH (ref 11.4–15.2)

## 2024-03-20 SURGERY — EGD (ESOPHAGOGASTRODUODENOSCOPY)
Anesthesia: Monitor Anesthesia Care | Laterality: Left

## 2024-03-20 MED ORDER — PANTOPRAZOLE SODIUM 40 MG PO TBEC
40.0000 mg | DELAYED_RELEASE_TABLET | Freq: Every day | ORAL | Status: DC
  Administered 2024-03-21 – 2024-03-24 (×4): 40 mg via ORAL
  Filled 2024-03-20 (×4): qty 1

## 2024-03-20 MED ORDER — PROPOFOL 500 MG/50ML IV EMUL
INTRAVENOUS | Status: DC | PRN
  Administered 2024-03-20: 150 ug/kg/min via INTRAVENOUS

## 2024-03-20 MED ORDER — EPHEDRINE SULFATE-NACL 50-0.9 MG/10ML-% IV SOSY
PREFILLED_SYRINGE | INTRAVENOUS | Status: DC | PRN
  Administered 2024-03-20 (×2): 5 mg via INTRAVENOUS

## 2024-03-20 MED ORDER — POTASSIUM CHLORIDE 10 MEQ/100ML IV SOLN
10.0000 meq | INTRAVENOUS | Status: AC
  Administered 2024-03-20 (×4): 10 meq via INTRAVENOUS
  Filled 2024-03-20 (×4): qty 100

## 2024-03-20 MED ORDER — PROPOFOL 10 MG/ML IV BOLUS
INTRAVENOUS | Status: DC | PRN
  Administered 2024-03-20: 30 mg via INTRAVENOUS

## 2024-03-20 MED ORDER — ALBUMIN HUMAN 5 % IV SOLN
INTRAVENOUS | Status: AC
  Filled 2024-03-20: qty 250

## 2024-03-20 MED ORDER — PHYTONADIONE 5 MG PO TABS: 2.5000 mg | ORAL_TABLET | Freq: Once | ORAL | Status: DC

## 2024-03-20 MED ORDER — LACTATED RINGERS IV SOLN: INTRAVENOUS | Status: DC | PRN

## 2024-03-20 MED ORDER — ALBUMIN HUMAN 25 % IV SOLN
25.0000 g | Freq: Once | INTRAVENOUS | Status: DC
  Filled 2024-03-20: qty 100

## 2024-03-20 MED ORDER — VITAMIN K1 10 MG/ML IJ SOLN
2.5000 mg | Freq: Once | INTRAVENOUS | Status: AC
  Administered 2024-03-20: 2.5 mg via INTRAVENOUS
  Filled 2024-03-20: qty 0.25

## 2024-03-20 MED ORDER — ALBUMIN HUMAN 5 % IV SOLN: INTRAVENOUS | Status: DC | PRN

## 2024-03-20 MED ORDER — PHENYLEPHRINE HCL-NACL 20-0.9 MG/250ML-% IV SOLN
INTRAVENOUS | Status: DC | PRN
  Administered 2024-03-20: 30 ug/min via INTRAVENOUS

## 2024-03-20 MED ORDER — PHENYLEPHRINE HCL (PRESSORS) 10 MG/ML IV SOLN
INTRAVENOUS | Status: DC | PRN
  Administered 2024-03-20: 240 ug via INTRAVENOUS
  Administered 2024-03-20: 160 ug via INTRAVENOUS

## 2024-03-20 MED ORDER — EPINEPHRINE 1 MG/10ML IJ SOSY
PREFILLED_SYRINGE | INTRAMUSCULAR | Status: AC
  Filled 2024-03-20: qty 10

## 2024-03-20 NOTE — Op Note (Signed)
 [facility] Patient Name: Nathaniel Warren Procedure Date: 03/20/2024 MRN: [id number] Attending MD: [name] , MD, [id number] Date of Birth: 01-16-1966 CSN: [id number] Age: 58 Admit Type: Inpatient Procedure:                Upper GI endoscopy Indications:              Generalized abdominal pain, Heme positive stool,                            Cirrhosis rule out esophageal varices Providers:                [name], MD, Perla Bradford, RN, Judith Novak,                            Technician Referring MD:              Medicines:                Monitored Anesthesia Care Complications:            No immediate complications. Estimated Blood Loss:     Estimated blood loss: none. Procedure:                Pre-Anesthesia Assessment:                           - Prior to the procedure, a History and Physical                            was performed, and patient medications and                            allergies were reviewed. The patient's tolerance of                            previous anesthesia was also reviewed. The risks                            and benefits of the procedure and the sedation                            options and risks were discussed with the patient.                            All questions were answered, and informed consent                            was obtained. Prior Anticoagulants: The patient has                            taken Xarelto (rivaroxaban), last dose was 2 days                            prior to procedure. ASA Grade Assessment: IV - A  patient with severe systemic disease that is a                            constant threat to life. After reviewing the risks                            and benefits, the patient was deemed in                            satisfactory condition to undergo the procedure.                           After obtaining informed consent, the endoscope was                             passed under direct vision. Throughout the                            procedure, the patient's blood pressure, pulse, and                            oxygen saturations were monitored continuously. The                            [id number]-[id number] ([id number]) Olympus endoscope was introduced                            through the mouth, and advanced to the second part                            of duodenum. The upper GI endoscopy was                            accomplished without difficulty. The patient                            tolerated the procedure well. Scope In: Scope Out: Findings:      Grade I varices were found in the lower third of the esophagus. They       were 4 mm in largest diameter.      The Z-line was regular and was found 44 cm from the incisors.      Mild portal hypertensive gastropathy was found in the entire examined       stomach.      The examined duodenum was normal. Impression:               - Grade I esophageal varices.                           - Z-line regular, 44 cm from the incisors.                           - Portal hypertensive gastropathy.                           -  Normal examined duodenum.                           - No blood noted in the visualized upper GI lumen.                           - No specimens collected. Moderate Sedation:      Not Applicable - Patient had care per Anesthesia. Recommendation:           - Return patient to hospital ward for ongoing care.                           - Advance diet as tolerated.                           - Continue present medications.                           - Continue trending serial CBCs                           - IR guided paracentesis                           - Vitamin K today to allow for paracentesis                           - Resume carvedilol when BP allows                           - Resume Lasix and amiloride when BP and renal                            function allow Procedure Code(s):        ---  Professional ---                           [phone], Esophagogastroduodenoscopy, flexible,                            transoral; diagnostic, including collection of                            specimen(s) by brushing or washing, when performed                            (separate procedure) Diagnosis Code(s):        --- Professional ---                           K74.60, Unspecified cirrhosis of liver                           I85.10, Secondary esophageal varices without                            bleeding  K76.6, Portal hypertension                           K31.89, Other diseases of stomach and duodenum                           R10.84, Generalized abdominal pain                           R19.5, Other fecal abnormalities CPT copyright 2022 [employer]. All rights reserved. The codes documented in this report are preliminary and upon coder review may  be revised to meet current compliance requirements. [name], MD 03/20/2024 11:23:44 AM Number of Addenda: 0

## 2024-03-20 NOTE — Transfer of Care (Signed)
 Immediate Anesthesia Transfer of Care Note  Patient: Nathaniel Warren  Procedure(s) Performed: EGD (ESOPHAGOGASTRODUODENOSCOPY) (Left)  Patient Location: PACU  Anesthesia Type:MAC  Level of Consciousness: drowsy and patient cooperative  Airway & Oxygen Therapy: Patient Spontanous Breathing and Patient connected to nasal cannula oxygen  Post-op Assessment: Report given to RN and Post -op Vital signs reviewed and stable  Post vital signs: Reviewed and stable  Last Vitals:  Vitals Value Taken Time  BP 95/52 03/20/24 1133  Temp 36.8 C 03/20/24 1133  Pulse 74 03/20/24 1137  Resp 12 03/20/24 1137  SpO2 96 % 03/20/24 1137  Vitals shown include unfiled device data.  Last Pain:  Vitals:   03/20/24 1133  TempSrc: Axillary  PainSc: Asleep      Patients Stated Pain Goal: 3 (03/20/24 0946)  Complications: No notable events documented.

## 2024-03-20 NOTE — Anesthesia Preprocedure Evaluation (Addendum)
 Anesthesia Evaluation  Patient identified by MRN, date of birth, ID band Patient awake    Reviewed: Allergy & Precautions, H&P , NPO status , Patient's Chart, lab work & pertinent test results  Airway Mallampati: II  TM Distance: >3 FB Neck ROM: Full    Dental  (+) Poor Dentition, Chipped, Dental Advisory Given   Pulmonary Current Smoker and Patient abstained from smoking.   Pulmonary exam normal breath sounds clear to auscultation       Cardiovascular hypertension, Pt. on home beta blockers  Rhythm:Regular Rate:Normal  Echo 02/2022 1. Left ventricular ejection fraction, by estimation, is 65 to 70%. The left ventricle has hyperdynamic function. The left ventricle has no regional wall motion abnormalities. Left ventricular diastolic parameters were normal.   2. Right ventricular systolic function is hyperdynamic. The right ventricular size is normal. Tricuspid regurgitation signal is inadequate for assessing PA pressure.   3. Left atrial size was mildly dilated.   4. The mitral valve is normal in structure. No evidence of mitral valve regurgitation.   5. The aortic valve is tricuspid. Aortic valve regurgitation is not visualized. No aortic stenosis is present.   Comparison(s): Findings suggest high cardiac output (consider anemia, thyrotoxicosis, sepsis, cirrhosis, etc).     Neuro/Psych Seizures -,  PSYCHIATRIC DISORDERS Anxiety Depression    CVA    GI/Hepatic negative GI ROS,,,(+) Cirrhosis   ascites  substance abuse  alcohol useEnd stage liver dz Patient coagulopathic due to liver failure   Endo/Other  negative endocrine ROS    Renal/GU negative Renal ROS  negative genitourinary   Musculoskeletal negative musculoskeletal ROS (+)    Abdominal   Peds negative pediatric ROS (+)  Hematology  (+) Blood dyscrasia, anemia thrombocytopenia   Anesthesia Other Findings   Reproductive/Obstetrics negative OB ROS                              Anesthesia Physical Anesthesia Plan  ASA: 4  Anesthesia Plan: MAC   Post-op Pain Management: Minimal or no pain anticipated   Induction: Intravenous  PONV Risk Score and Plan: 0 and Treatment may vary due to age or medical condition, Propofol infusion and TIVA  Airway Management Planned: Natural Airway  Additional Equipment:   Intra-op Plan:   Post-operative Plan:   Informed Consent: I have reviewed the patients History and Physical, chart, labs and discussed the procedure including the risks, benefits and alternatives for the proposed anesthesia with the patient or authorized representative who has indicated his/her understanding and acceptance.     Dental advisory given  Plan Discussed with: CRNA  Anesthesia Plan Comments:        Anesthesia Quick Evaluation

## 2024-03-20 NOTE — Progress Notes (Signed)
   03/20/24 1550  TOC Brief Assessment  Insurance and Status Reviewed  Patient has primary care physician Yes  Home environment has been reviewed home w/ friends  Prior level of function: independent  Prior/Current Home Services No current home services  Social Drivers of Health Review SDOH reviewed no interventions necessary  Readmission risk has been reviewed Yes  Transition of care needs no transition of care needs at this time

## 2024-03-20 NOTE — Progress Notes (Signed)
 Progress Note   Patient: Nathaniel Warren UJW:[id number] DOB: October 13, 1966 DOA: 03/18/2024     2 DOS: the patient was seen and examined on 03/20/2024   Brief hospital course:  OSSIE BELTRAN is a 58 y.o. male with medical history significant for alcoholism in remission, liver cirrhosis with ascites, portal venous thrombosis on Xarelto, depression, and anxiety who presents with abdominal pain and rectal bleeding.   Patient reports that he has been experiencing increased pain throughout his abdomen but mainly across the lower quadrants.  He experienced bright red blood per rectum yesterday which she describes as a large volume.  He has had dark red stool since then.  Stool has been looser than usual but not watery.  He had a paracentesis 2 weeks ago but notes that his abdomen has become distended again.  He reports subjective fever at home.  Pt was recently started on xarelto for portal vein thrombosis to prevent propagation of the thrombosis.This has been held.   ED Course: Upon arrival to the ED, patient is found to be afebrile and saturating well on room air with normal heart rate and stable BP.  Labs are most notable for sodium 130, potassium 3.0, creatinine 1.25, albumin 2.0, total bilirubin 15.7, WBC 18,200, hemoglobin 9.5, platelets 55,000, positive FOBT, and INR 2.9.   GI was consulted by the ED physician, blood cultures were collected in the ED, and the patient was treated with fentanyl, IV Protonix, IV potassium, Rocephin, and Flagyl.  The patient has been evaluated by Dr. Karene Oto for GI. He is found to have a MELD score of 34. IR was consulted for paracentesis, although this was put on hold due to the patient's INR of 3.1 on 03/19/2024. The recommendation is to limit the amount taken to 3L. The patient is to receive IV albumin with the procedure.  Possible EGD for 03/20/2024. NPO after midnight.   Assessment and Plan:  1. GI bleeding - Pt reports BRBPR on 03/17/2024 and darker blood on  03/18/2024. He continued to have black sofgter stools on 03/19/2024 without N/V; BUN is normal but has doubled  - Hold Xarelto, continue IV PPI, follow serial CBCs, transfuse if needed   -Possible EGD on [id number].   2. Acute abdominal pain; decompensated cirrhosis  - MELD-Na is 33 on admission though Xarelto may be affecting INR  - He was started on empiric treatment for SBP in ED given SIRS criteria, acute abdominal pain, and reported fever at home   - Consult IR for paracentesis with fluid analysis, continue Rocephin. INR 3.1 on 03/19/2024. Procedure will need to beheld until INR less than 1/4. No more than 3L to be removed. The patient will receive albumin with the procedure.   3. Thrombocytopenia  - Platelets 55k on admission in setting of chronic liver disease  - Monitor, transfuse if needed     4. Anxiety  - Seroquel     5. Portal vein thrombosis  - Hold Xarelto for now     6. Hypokalemia  - Supplement and monitor.     DVT prophylaxis: SCDs  Code Status: Full  Level of Care: Level of care: Progressive Family Communication: Father and daughter at bedside   Disposition Plan:  Patient is from: home  Anticipated d/c is to: TBD Anticipated d/c date is: 03/21/24  Patient currently: Pending stable H&H, ascitic fluid analysis, pain-control, GI consultation  Consults called: GI  Admission status: Inpatient      {Tip this will not be part of the  note when signed Body mass index is 28.03 kg/m. , ,  Active Pressure Injury/Wound(s)     Pressure Ulcer  Duration          Pressure Injury 02/09/23 Buttocks Left Stage 2 -  Partial thickness loss of dermis presenting as a shallow open injury with a red, pink wound bed without slough. 405 days           (Optional):26781}  Subjective: The patient appears jaundiced and acutely ill. He is complaining of abdominal and back pain.  Physical Exam: Vitals:   03/20/24 1200 03/20/24 1209 03/20/24 1211 03/20/24 1224  BP: (!) 93/56 (!)  97/53 (!) 92/58 (!) 96/53  Pulse: 79 80 75 74  Resp: 18 (!) 22 16 16   Temp:    98 F (36.7 C)  TempSrc:    Oral  SpO2: 92% 92% 92% 95%  Weight:      Height:       Exam:  Constitutional:  The patient is awake, alert, and oriented x 3. No acute distress. Eyes:  pupils and irises appear normal Sclera are icteric ERespiratory:  No increased work of breathing. No wheezes, rales, or rhonchi No tactile fremitus Cardiovascular:  Regular rate and rhythm No murmurs, ectopy, or gallups. No lateral PMI. No thrills. Abdomen:  Abdomen is distended and tender in the right upper quadrant and epigastrum No hernias, masses, or organomegaly Normoactive bowel sounds.  Musculoskeletal:  No cyanosis, clubbing, or edema Skin:  No rashes, lesions, ulcers palpation of skin: no induration or nodules Neurologic:  CN 2-12 intact Sensation all 4 extremities intact Psychiatric:  Mental status Mood, affect appropriate Orientation to person, place, time  judgment and insight appear intact  Data Reviewed:  CBC, CMP, INR  Family Communication: None available  Disposition: Status is: Inpatient Remains inpatient appropriate because: Need for further investigation into melena, paracentesis, volume management.  Planned Discharge Destination:  tbd {Tip this will not be part of the note when signed  DVT Prophylaxis  ., Scds  (Optional):26781}   Time spent: 34 minutes  Author: Junita Oliva, DO 03/20/2024 6:44 PM  For on call review [email or web address].

## 2024-03-20 NOTE — Interval H&P Note (Signed)
 History and Physical Interval Note:  03/20/2024 10:51 AM  Nathaniel Warren  has presented today for surgery, with the diagnosis of Hematochezia, melena, esophageal varices.  The various methods of treatment have been discussed with the patient and family. After consideration of risks, benefits and other options for treatment, the patient has consented to  Procedure(s): EGD (ESOPHAGOGASTRODUODENOSCOPY) (Left) as a surgical intervention.  The patient's history has been reviewed, patient examined, no change in status, stable for surgery.  I have reviewed the patient's chart and labs.  Questions were answered to the patient's satisfaction.     Laquetta Plank Miaya Lafontant

## 2024-03-20 NOTE — Anesthesia Postprocedure Evaluation (Signed)
 Anesthesia Post Note  Patient: Nathaniel Warren  Procedure(s) Performed: EGD (ESOPHAGOGASTRODUODENOSCOPY) (Left)     Patient location during evaluation: PACU Anesthesia Type: MAC Level of consciousness: awake and alert Pain management: pain level controlled Vital Signs Assessment: post-procedure vital signs reviewed and stable Respiratory status: spontaneous breathing Cardiovascular status: stable Anesthetic complications: no   No notable events documented.  Last Vitals:  Vitals:   03/20/24 1211 03/20/24 1224  BP: (!) 92/58 (!) 96/53  Pulse: 75 74  Resp: 16 16  Temp:  36.7 C  SpO2: 92% 95%    Last Pain:  Vitals:   03/20/24 1434  TempSrc:   PainSc: Asleep                 Gorman Laughter

## 2024-03-21 ENCOUNTER — Inpatient Hospital Stay (HOSPITAL_COMMUNITY)

## 2024-03-21 DIAGNOSIS — K7031 Alcoholic cirrhosis of liver with ascites: Secondary | ICD-10-CM | POA: Diagnosis not present

## 2024-03-21 DIAGNOSIS — K7460 Unspecified cirrhosis of liver: Secondary | ICD-10-CM | POA: Diagnosis not present

## 2024-03-21 DIAGNOSIS — Z7901 Long term (current) use of anticoagulants: Secondary | ICD-10-CM | POA: Diagnosis not present

## 2024-03-21 DIAGNOSIS — D62 Acute posthemorrhagic anemia: Secondary | ICD-10-CM

## 2024-03-21 DIAGNOSIS — K529 Noninfective gastroenteritis and colitis, unspecified: Secondary | ICD-10-CM

## 2024-03-21 DIAGNOSIS — K7290 Hepatic failure, unspecified without coma: Secondary | ICD-10-CM | POA: Diagnosis not present

## 2024-03-21 DIAGNOSIS — D689 Coagulation defect, unspecified: Secondary | ICD-10-CM

## 2024-03-21 DIAGNOSIS — I8500 Esophageal varices without bleeding: Secondary | ICD-10-CM | POA: Diagnosis not present

## 2024-03-21 DIAGNOSIS — I864 Gastric varices: Secondary | ICD-10-CM | POA: Diagnosis not present

## 2024-03-21 LAB — PROTEIN, PLEURAL OR PERITONEAL FLUID: Total protein, fluid: <3 g/dL

## 2024-03-21 LAB — COMPREHENSIVE METABOLIC PANEL WITH GFR
ALT: 23 U/L (ref 0–44)
AST: 60 U/L — ABNORMAL HIGH (ref 15–41)
Albumin: 2 g/dL — ABNORMAL LOW (ref 3.5–5.0)
Alkaline Phosphatase: 120 U/L (ref 38–126)
Anion gap: 6 (ref 5–15)
BUN: 17 mg/dL (ref 6–20)
CO2: 25 mmol/L (ref 22–32)
Calcium: 7.8 mg/dL — ABNORMAL LOW (ref 8.9–10.3)
Chloride: 103 mmol/L (ref 98–111)
Creatinine, Ser: 1.05 mg/dL (ref 0.61–1.24)
GFR, Estimated: >60 mL/min (ref >60)
Glucose, Bld: 112 mg/dL — ABNORMAL HIGH (ref 70–99)
Potassium: 3.7 mmol/L (ref 3.5–5.1)
Sodium: 134 mmol/L — ABNORMAL LOW (ref 135–145)
Total Bilirubin: 12.9 mg/dL — ABNORMAL HIGH (ref 0.0–1.2)
Total Protein: 5.8 g/dL — ABNORMAL LOW (ref 6.5–8.1)

## 2024-03-21 LAB — BODY FLUID CELL COUNT WITH DIFFERENTIAL
Lymphs, Fluid: 15 %
Monocyte-Macrophage-Serous Fluid: 60 % (ref 50–90)
Neutrophil Count, Fluid: 25 % (ref 0–25)
Total Nucleated Cell Count, Fluid: 551 uL (ref 0–1000)

## 2024-03-21 LAB — CBC WITH DIFFERENTIAL/PLATELET
Abs Immature Granulocytes: 0.21 10*3/uL — ABNORMAL HIGH (ref 0.00–0.07)
Basophils Absolute: 0.1 10*3/uL (ref 0.0–0.1)
Basophils Relative: 1 %
Eosinophils Absolute: 0.6 10*3/uL — ABNORMAL HIGH (ref 0.0–0.5)
Eosinophils Relative: 6 %
HCT: 24.4 % — ABNORMAL LOW (ref 39.0–52.0)
Hemoglobin: 8.1 g/dL — ABNORMAL LOW (ref 13.0–17.0)
Immature Granulocytes: 2 %
Lymphocytes Relative: 17 %
Lymphs Abs: 1.6 10*3/uL (ref 0.7–4.0)
MCH: 39.5 pg — ABNORMAL HIGH (ref 26.0–34.0)
MCHC: 33.2 g/dL (ref 30.0–36.0)
MCV: 119 fL — ABNORMAL HIGH (ref 80.0–100.0)
Monocytes Absolute: 1.7 10*3/uL — ABNORMAL HIGH (ref 0.1–1.0)
Monocytes Relative: 18 %
Neutro Abs: 5.3 10*3/uL (ref 1.7–7.7)
Neutrophils Relative %: 56 %
Platelets: 39 10*3/uL — ABNORMAL LOW (ref 150–400)
RBC: 2.05 MIL/uL — ABNORMAL LOW (ref 4.22–5.81)
RDW: 17.1 % — ABNORMAL HIGH (ref 11.5–15.5)
WBC: 9.5 10*3/uL (ref 4.0–10.5)
nRBC: 0.4 % — ABNORMAL HIGH (ref 0.0–0.2)

## 2024-03-21 LAB — PROTIME-INR
INR: 2.8 — ABNORMAL HIGH (ref 0.8–1.2)
INR: 2.7 — ABNORMAL HIGH (ref 0.8–1.2)
Prothrombin Time: 29.6 s — ABNORMAL HIGH (ref 11.4–15.2)
Prothrombin Time: 28.7 s — ABNORMAL HIGH (ref 11.4–15.2)

## 2024-03-21 LAB — LACTATE DEHYDROGENASE, PLEURAL OR PERITONEAL FLUID: LD, Fluid: 49 U/L — ABNORMAL HIGH (ref 3–23)

## 2024-03-21 LAB — GLUCOSE, PLEURAL OR PERITONEAL FLUID: Glucose, Fluid: 123 mg/dL

## 2024-03-21 LAB — ALBUMIN, PLEURAL OR PERITONEAL FLUID: Albumin, Fluid: <1.5 g/dL

## 2024-03-21 MED ORDER — LIDOCAINE HCL 1 % IJ SOLN
INTRAMUSCULAR | Status: AC
  Filled 2024-03-21: qty 20

## 2024-03-21 MED ORDER — VITAMIN K1 10 MG/ML IJ SOLN
5.0000 mg | Freq: Once | INTRAVENOUS | Status: AC
  Administered 2024-03-21: 5 mg via INTRAVENOUS
  Filled 2024-03-21: qty 0.5

## 2024-03-21 MED ORDER — ORAL CARE MOUTH RINSE: 15.0000 mL | OROMUCOSAL | Status: DC | PRN

## 2024-03-21 MED ORDER — ALBUMIN HUMAN 25 % IV SOLN
25.0000 g | Freq: Once | INTRAVENOUS | Status: AC | PRN
  Administered 2024-03-21: 25 g via INTRAVENOUS
  Filled 2024-03-21 (×2): qty 100

## 2024-03-21 NOTE — Procedures (Signed)
 PROCEDURE SUMMARY:  Successful image-guided paracentesis from the right abdomen.  Yielded 5.0 liters of clear, yellow peritoneal fluid.  No immediate complications.  EBL: zero Patient tolerated well.   Specimen was sent for labs.  Please see imaging section of Epic for full dictation.  Gordy Lauber Tanny Harnack PA-C 03/21/2024 10:27 AM

## 2024-03-21 NOTE — Progress Notes (Signed)
 [facility] Progress Note  CC:   Abdominal pain, BRBPR   Subjective: He denies having any nausea or vomiting.  He has less abdominal distention since undergoing a paracentesis earlier this morning.  He stated passing a small black stool yesterday without bright red blood.  No BM, melena or hematochezia today.  He wishes to have his outpatient labs done in our office which is more cost effective than having labs done at [facility] when ordered by by Annamarie Major NP with [facility].    Objective:   EGD 03/20/2024: - Grade I esophageal varices.  - Z-line regular, 44 cm from the incisors.  - Portal hypertensive gastropathy.  - Normal examined duodenum.  - No blood noted in the visualized upper GI lumen.  - No specimens collected.  Vital signs in last 24 hours: Temp:  [97.7 F (36.5 C)-98.6 F (37 C)] 97.7 F (36.5 C) (04/14 1103) Pulse Rate:  [60-70] 60 (04/14 1210) Resp:  [20-21] 20 (04/14 1210) BP: (90-98)/(53-62) 92/59 (04/14 1210) SpO2:  [96 %-98 %] 97 % (04/14 1103) Last BM Date : 03/19/24 General: 58 year old male chronically ill-appearing in no acute distress. Eyes: Scleral icterus present. Heart: Regular rate and rhythm, no murmurs. Pulm: Breath sounds clear throughout. Abdomen: Soft, nondistended. Residual ascites status post paracentesis present, abdomen is not tense.  Mild tenderness to the epigastric, RUQ and RLQ without rebound or guarding.  Supraumbilical hernia.  No palpable mass.  No bruit. Extremities: No lower extremity edema. Neurologic:  Alert and  oriented x 4.  Speech is clear.  Moves all extremities equally. Psych:  Alert and cooperative. Normal mood and affect.  Intake/Output from previous day: 04/13 0701 - 04/14 0700 In: 2063.3 [P.O.:148; I.V.:1008.7; IV Piggyback:906.6] Out: 475 [Urine:475] Intake/Output this shift: Total I/O In: 120 [P.O.:120] Out: 150 [Urine:150]  Lab Results: Recent Labs    03/19/24 2331 03/20/24 1408  03/21/24 0504  WBC 11.2* 9.6 9.5  HGB 8.1* 8.3* 8.1*  HCT 24.1* 25.0* 24.4*  PLT 53* 49* 39*   BMET Recent Labs    03/19/24 0534 03/20/24 0542 03/21/24 0504  NA 132* 133* 134*  K 3.3* 3.3* 3.7  CL 98 101 103  CO2 27 27 25   GLUCOSE 111* 115* 112*  BUN 22* 20 17  CREATININE 1.23 1.13 1.05  CALCIUM 8.1* 7.7* 7.8*   LFT Recent Labs    03/21/24 0504  PROT 5.8*  ALBUMIN 2.0*  AST 60*  ALT 23  ALKPHOS 120  BILITOT 12.9*   PT/INR Recent Labs    03/21/24 0504 03/21/24 1111  LABPROT 29.6* 28.7*  INR 2.8* 2.7*   Hepatitis Panel No results for input(s): "HEPBSAG", "HCVAB", "HEPAIGM", "HEPBIGM" in the last 72 hours.  No results found.  Patient Profile: Nathaniel Warren is a 58 y.o. male with a history of EtOH cirrhosis c/b ascites (quit EtOH 12/2021), esophageal varices, gastric varices, along with portal vein thrombosis (on Xarelto), depression/anxiety, bipolar disorder, tobacco use disorder, THC use, history of seizures, CVA and PTSD. Admitted with abdominal pain and BRBPR.   Assessment / Plan:  58 year old male with decompensated alcohol associated cirrhosis with ascites, esophageal and gastric varices. Current MELD 34, although INR might be slightly higher than expected due to recent initiation of anticoagulation.  CTAP showed cirrhosis with portal hypertension, Nurse malformation of the portal vein and moderate ascites. S/P paracentesis today, no SBP. Received albumin 20 mg IV x 1.  No clinical evidence of hepatic encephalopathy.  T. Bili 11.9 -> 12.9. Alk phos 120. AST 60. ALT 23. Hemodynamically stable. -If renal status stable in am 4/15, may consider restarting Lasix and Amiloride  -2 g low-sodium diet -Continue Pantoprazole 40 mg p.o. daily -CBC, BMP, hepatic panel including direct bili and INR in a.m.  Abdominal pain, acute onset abdominal pain which started on 4/9 and progressively worsening at home. Pain worse in the lower abdomen, but also a band of pain across  higher abdomen and flanks.  CTAP 03/18/2024 showed enterocolitis, query hepatic colopathy. -Pain management per the hospitalist -Consider repeat CTAP if abdominal pains worsen     Hematochezia +/- melena. Octreotide discontinued. EGD 03/21/2023 showed grade I esophageal varices, portal hypertensive gastropathy. Last past a small black stool on 4/13.  - ? Continue Rocephin for infection prophylaxis in setting of GI bleed in cirrhotic patienr  Acute on chronic anemia secondary to cirrhosis and GI bleed with hematochezia +/- melena. - CBC in a.m. - Transfuse for hemoglobin level less than 7  Portal vein thrombosis with cavernous transformation.  Xarelto on hold. -Defer Xarelto restart recommendations to Dr. Brice Campi  Coagulopathy secondary to cirrhosis.  INR 2.9 -> 3.1 -> 2.8 -> 2.7. Received vitamin K 5mg   IV x 1 dose today prior to paracentesis. -Daily PT/INR   Thrombocytopenia secondary to cirrhosis -CBC in am   AKI -BMP in am  Hyponatremia secondary to cirrhosis.  Na+ 134. Diuretics on hold since admission.      Principal Problem:   Decompensated cirrhosis (HCC) Active Problems:   GAD (generalized anxiety disorder)   Hypokalemia   Thrombocytopenia (HCC)   Major depressive disorder, recurrent (HCC)   Portal vein thrombosis   GI bleeding   Intractable abdominal pain   Enterocolitis   Generalized abdominal pain   Secondary esophageal varices without bleeding (HCC)   Portal hypertensive gastropathy (HCC)     LOS: 3 days   Tory Freiberg  03/21/2024, 4:25 PM

## 2024-03-21 NOTE — Progress Notes (Signed)
 Progress Note   Patient: Nathaniel Warren:[id number] DOB: 11-11-1966 DOA: 03/18/2024     3 DOS: the patient was seen and examined on 03/21/2024   Brief hospital course:  Nathaniel Warren is a 58 y.o. male with medical history significant for alcoholism in remission, liver cirrhosis with ascites, portal venous thrombosis on Xarelto, depression, and anxiety who presents with abdominal pain and rectal bleeding.   Patient reports that he has been experiencing increased pain throughout his abdomen but mainly across the lower quadrants.  He experienced bright red blood per rectum yesterday which she describes as a large volume.  He has had dark red stool since then.  Stool has been looser than usual but not watery.  He had a paracentesis 2 weeks ago but notes that his abdomen has become distended again.  He reports subjective fever at home.  Pt was recently started on xarelto for portal vein thrombosis to prevent propagation of the thrombosis.This has been held.   ED Course: Upon arrival to the ED, patient is found to be afebrile and saturating well on room air with normal heart rate and stable BP.  Labs are most notable for sodium 130, potassium 3.0, creatinine 1.25, albumin 2.0, total bilirubin 15.7, WBC 18,200, hemoglobin 9.5, platelets 55,000, positive FOBT, and INR 2.9.   GI was consulted by the ED physician, blood cultures were collected in the ED, and the patient was treated with fentanyl, IV Protonix, IV potassium, Rocephin, and Flagyl.  The patient has been evaluated by Dr. Karene Oto for GI. He is found to have a MELD score of 34. IR was consulted for paracentesis, although this was put on hold due to the patient's INR of 3.1 on 03/19/2024 and 2.8 on 03/20/2024. The recommendation is to limit the amount taken to 3L. The patient is to receive IV albumin with the procedure.  The patient underwent EGD on 03/20/2024 that demonstrated Grade 1 esophageal varices, gastropathy, portal hypertension.  Recommendation is for IV vitamin K to reduce INR so that paracentesis may be performed. Advance diet as tolerated, continue present medications, continue to trend serial CBC's, Resume carvedilol when BP allows. Resume lasix and amiloride when BP and renal function allows.  The patient underwent paracentesis of 5L on 03/21/2024. He was given albumin after the procedure. He stated that he continued to have discomfort in his abdomen following the procedure.  Results of the paracentesis are pending.   Assessment and Plan:  1. GI bleeding - Pt reports BRBPR on 03/17/2024 and darker blood on 03/18/2024. He continued to have black sofgter stools on 03/19/2024 without N/V; BUN is normal but has doubled  - Hold Xarelto, continue IV PPI, follow serial CBCs, transfuse if needed   -The patient underwent EGD on 03/20/2024 that demonstrated Grade 1 esophageal varices, gastropathy, portal hypertension. Recommendation is for IV vitamin K to reduce INR so that paracentesis may be performed. Advance diet as tolerated, continue present medications, continue to trend serial CBC's, Resume carvedilol when BP allows. Resume lasix and amiloride when BP and renal function allows. - Hemoglobin has remained stable at 8.3.   2. Acute abdominal pain; decompensated cirrhosis  - MELD-Na is 33 on admission though Xarelto may be affecting INR  - He was started on empiric treatment for SBP in ED given SIRS criteria, acute abdominal pain, and reported fever at home   - Consult IR for paracentesis with fluid analysis, continue Rocephin. INR 2.8 on 03/20/2024. The patient has been given IV Vitamin K to  reduce INR more quickly.  The patient underwent paracentesis of 5L on 03/21/2024. He was given albumin after the procedure. He stated that he continued to have discomfort in his abdomen following the procedure.  Results of the paracentesis are pending.   3. Thrombocytopenia  - Platelets 55k on admission and 49 on 03/20/2024 in setting of  chronic liver disease  - Monitor, transfuse if needed     4. Anxiety  - Seroquel     5. Portal vein thrombosis  - Hold Xarelto for now     6. Hypokalemia  - Supplement and monitor. -3.7 on 03/21/2024  I have seen and examined the patient myself. I have spent 34 minutes in his evaluation and care.     DVT prophylaxis: SCDs  Code Status: Full  Level of Care: Level of care: Progressive Family Communication: Father and daughter at bedside   Disposition Plan:  Patient is from: home  Anticipated d/c is to: TBD Anticipated d/c date is: 03/22/2024 Patient currently: Pending stable H&H, ascitic fluid analysis, pain-control, GI consultation  Consults called: GI  Admission status: Inpatient   Procedure: EGD 03/20/2024 Paracentesis with removal of 5L fluid on 03/21/2024.      Subjective: The patient appears jaundiced and acutely ill. He is complaining of abdominal and back pain.  Physical Exam: Vitals:   03/21/24 1049 03/21/24 1103 03/21/24 1210 03/21/24 1949  BP: (!) 93/54 (!) 91/53 (!) 92/59 (!) 84/54  Pulse:  60 60 (!) 58  Resp:  (!) 21 20 18   Temp:  97.7 F (36.5 C)  98.1 F (36.7 C)  TempSrc:  Oral  Oral  SpO2:  97%  98%  Weight:      Height:       Exam:  Constitutional:  The patient is awake, alert, and oriented x 3. No acute distress. Eyes:  pupils and irises appear normal Sclera are icteric ERespiratory:  No increased work of breathing. No wheezes, rales, or rhonchi No tactile fremitus Cardiovascular:  Regular rate and rhythm No murmurs, ectopy, or gallups. No lateral PMI. No thrills. Abdomen:  Abdomen is distended and tender in the right upper quadrant and epigastrum No hernias, masses, or organomegaly Normoactive bowel sounds.  Musculoskeletal:  No cyanosis, clubbing, or edema Skin:  No rashes, lesions, ulcers palpation of skin: no induration or nodules Neurologic:  CN 2-12 intact Sensation all 4 extremities intact Psychiatric:  Mental  status Mood, affect appropriate Orientation to person, place, time  judgment and insight appear intact  Data Reviewed:  CBC, CMP, INR EGD report  Family Communication: None available  Disposition: Status is: Inpatient Remains inpatient appropriate because: Need for further investigation into melena, paracentesis, volume management.  Planned Discharge Destination:  tbd    Time spent: 32 minutes  Author: Kennett Symes, DO 03/21/2024 8:16 PM  For on call review [email or web address].

## 2024-03-22 ENCOUNTER — Encounter (HOSPITAL_COMMUNITY): Payer: Self-pay | Admitting: Gastroenterology

## 2024-03-22 DIAGNOSIS — K7290 Hepatic failure, unspecified without coma: Secondary | ICD-10-CM | POA: Diagnosis not present

## 2024-03-22 DIAGNOSIS — I864 Gastric varices: Secondary | ICD-10-CM | POA: Diagnosis not present

## 2024-03-22 DIAGNOSIS — K7031 Alcoholic cirrhosis of liver with ascites: Secondary | ICD-10-CM | POA: Diagnosis not present

## 2024-03-22 DIAGNOSIS — K7460 Unspecified cirrhosis of liver: Secondary | ICD-10-CM | POA: Diagnosis not present

## 2024-03-22 DIAGNOSIS — Z7901 Long term (current) use of anticoagulants: Secondary | ICD-10-CM | POA: Diagnosis not present

## 2024-03-22 DIAGNOSIS — I8500 Esophageal varices without bleeding: Secondary | ICD-10-CM | POA: Diagnosis not present

## 2024-03-22 LAB — PATHOLOGIST SMEAR REVIEW: Path Review: REACTIVE

## 2024-03-22 LAB — PROTIME-INR
INR: 3.2 — ABNORMAL HIGH (ref 0.8–1.2)
INR: 3.3 — ABNORMAL HIGH (ref 0.8–1.2)
Prothrombin Time: 32.9 s — ABNORMAL HIGH (ref 11.4–15.2)
Prothrombin Time: 33.5 s — ABNORMAL HIGH (ref 11.4–15.2)

## 2024-03-22 LAB — COMPREHENSIVE METABOLIC PANEL WITH GFR
ALT: 19 U/L (ref 0–44)
AST: 51 U/L — ABNORMAL HIGH (ref 15–41)
Albumin: 2.4 g/dL — ABNORMAL LOW (ref 3.5–5.0)
Alkaline Phosphatase: 126 U/L (ref 38–126)
Anion gap: 8 (ref 5–15)
BUN: 15 mg/dL (ref 6–20)
CO2: 23 mmol/L (ref 22–32)
Calcium: 7.9 mg/dL — ABNORMAL LOW (ref 8.9–10.3)
Chloride: 103 mmol/L (ref 98–111)
Creatinine, Ser: 0.93 mg/dL (ref 0.61–1.24)
GFR, Estimated: >60 mL/min (ref >60)
Glucose, Bld: 90 mg/dL (ref 70–99)
Potassium: 3.6 mmol/L (ref 3.5–5.1)
Sodium: 134 mmol/L — ABNORMAL LOW (ref 135–145)
Total Bilirubin: 10.8 mg/dL — ABNORMAL HIGH (ref 0.0–1.2)
Total Protein: 5.6 g/dL — ABNORMAL LOW (ref 6.5–8.1)

## 2024-03-22 MED ORDER — ALBUMIN HUMAN 25 % IV SOLN
50.0000 g | Freq: Once | INTRAVENOUS | Status: AC
  Administered 2024-03-22: 50 g via INTRAVENOUS
  Filled 2024-03-22: qty 200

## 2024-03-22 MED ORDER — FUROSEMIDE 40 MG PO TABS
80.0000 mg | ORAL_TABLET | Freq: Every day | ORAL | Status: DC
Start: 2024-03-22 — End: 2024-03-24
  Administered 2024-03-22 – 2024-03-24 (×3): 80 mg via ORAL
  Filled 2024-03-22 (×3): qty 2

## 2024-03-22 MED ORDER — LACTULOSE 10 GM/15ML PO SOLN
20.0000 g | Freq: Two times a day (BID) | ORAL | Status: DC
  Administered 2024-03-22 – 2024-03-24 (×5): 20 g via ORAL
  Filled 2024-03-22 (×5): qty 30

## 2024-03-22 MED ORDER — AMILORIDE HCL 5 MG PO TABS
5.0000 mg | ORAL_TABLET | Freq: Every day | ORAL | Status: DC
  Administered 2024-03-22 – 2024-03-23 (×2): 5 mg via ORAL
  Filled 2024-03-22 (×2): qty 1

## 2024-03-22 NOTE — Progress Notes (Signed)
 [facility] Progress Note  CC:  Abdominal pain, BRBPR   Subjective: Denies having any nausea or vomiting.  He has epigastric pain which radiates to the lower xiphoid area.  No upper chest pain.  No shortness of breath.  No bloody or black stools.  His abdomen is more swollen today.  Objective:   EGD 03/20/2024: - Grade I esophageal varices.  - Z-line regular, 44 cm from the incisors.  - Portal hypertensive gastropathy.  - Normal examined duodenum.  - No blood noted in the visualized upper GI lumen.  - No specimens collected.  Vital signs in last 24 hours: Temp:  [97.7 F (36.5 C)-98.1 F (36.7 C)] 98.1 F (36.7 C) (04/15 1214) Pulse Rate:  [57-76] 58 (04/15 1214) Resp:  [18-20] 20 (04/15 1214) BP: (84-117)/(53-64) 92/64 (04/15 1214) SpO2:  [96 %-100 %] 96 % (04/15 1214) Last BM Date : 03/22/24 General: 58 year old male fatigued.  In no acute distress. Eyes: Moderate scleral icterus. Heart: Regular rate and rhythm, systolic murmur. Pulm: Breath sounds clear, decreased in the bases. Abdomen: Abdomen is more distended with ascites but is not tense.  Epigastric tenderness which radiates to the xiphoid process.  No rebound or guarding.  Positive bowel sounds all 4 quadrants. Extremities:  No edema. Neurologic:  Alert and  oriented x 4.  Speech is clear.  Response to questions is slightly delayed.  Moves all extremities equally.  Hands are mildly tremulous without asterixis. Psych:  Alert and cooperative. Normal mood and affect.  Intake/Output from previous day: 04/14 0701 - 04/15 0700 In: 761 [P.O.:496; IV Piggyback:265] Out: 600 [Urine:600] Intake/Output this shift: Total I/O In: 240 [P.O.:240] Out: 150 [Urine:150]  Lab Results: Recent Labs    03/19/24 2331 03/20/24 1408 03/21/24 0504  WBC 11.2* 9.6 9.5  HGB 8.1* 8.3* 8.1*  HCT 24.1* 25.0* 24.4*  PLT 53* 49* 39*   BMET Recent Labs    03/20/24 0542 03/21/24 0504 03/22/24 0455  NA 133* 134*  134*  K 3.3* 3.7 3.6  CL 101 103 103  CO2 27 25 23   GLUCOSE 115* 112* 90  BUN 20 17 15   CREATININE 1.13 1.05 0.93  CALCIUM 7.7* 7.8* 7.9*   LFT Recent Labs    03/22/24 0455  PROT 5.6*  ALBUMIN 2.4*  AST 51*  ALT 19  ALKPHOS 126  BILITOT 10.8*   PT/INR Recent Labs    03/21/24 1111 03/22/24 0455  LABPROT 28.7* 32.9*  INR 2.7* 3.2*    MELD 3.0: 30 at 03/22/2024  4:55 AM MELD-Na: 29 at 03/22/2024  4:55 AM Calculated from: Serum Creatinine: 0.93 mg/dL (Using min of 1 mg/dL) at 1/61/0960  4:54 AM Serum Sodium: 134 mmol/L at 03/22/2024  4:55 AM Total Bilirubin: 10.8 mg/dL at 0/98/1191  4:78 AM Serum Albumin: 2.4 g/dL at 2/95/6213  0:86 AM INR(ratio): 3.2 at 03/22/2024  4:55 AM Age at listing (hypothetical): 58 years Sex: Male at 03/22/2024  4:55 AM   Hepatitis Panel No results for input(s): "HEPBSAG", "HCVAB", "HEPAIGM", "HEPBIGM" in the last 72 hours.  US Paracentesis Result Date: 03/21/2024 INDICATION: 58 year old male with cirrhosis, recurrent ascites. IR was requested for diagnostic and therapeutic paracentesis. EXAM: ULTRASOUND GUIDED DIAGNOSTIC AND THERAPEUTIC PARACENTESIS MEDICATIONS: 8 cc of 1% lidocaine COMPLICATIONS: None immediate. PROCEDURE: Informed written consent was obtained from the patient after a discussion of the risks, benefits and alternatives to treatment. A timeout was performed prior to the initiation of the procedure. Initial ultrasound scanning demonstrates a large amount  of ascites within the right lower abdominal quadrant. The right lower abdomen was prepped and draped in the usual sterile fashion. 1% lidocaine was used for local anesthesia. Following this, a 19 gauge, 7-cm, Yueh catheter was introduced. An ultrasound image was saved for documentation purposes. The paracentesis was performed. The catheter was removed and a dressing was applied. The patient tolerated the procedure well without immediate post procedural complication. Patient received  post-procedure intravenous albumin; see nursing notes for details. FINDINGS: A total of approximately 5.0 of clear, yellow peritoneal fluid was removed. Samples were sent to the laboratory as requested by the clinical team. IMPRESSION: Successful ultrasound-guided paracentesis yielding 5.0 liters of peritoneal fluid. Procedure performed by Buzzy Han, PA-C Electronically Signed   By: Richarda Overlie M.D.   On: 03/21/2024 16:54   Patient Profile: Nathaniel Warren is a 58 y.o. male with a history of EtOH cirrhosis c/b ascites (quit EtOH 12/2021), esophageal varices, gastric varices, along with portal vein thrombosis (on Xarelto), depression/anxiety, bipolar disorder, tobacco use disorder, THC use, history of seizures, CVA and PTSD. Admitted with abdominal pain and BRBPR.   Assessment / Plan:  58 year old male with decompensated alcohol associated cirrhosis with ascites, esophageal and gastric varices. Current MELD 34 - > 30, although INR might be slightly higher than expected due to recent initiation of anticoagulation.  CTAP showed cirrhosis with portal hypertension, Nurse malformation of the portal vein and moderate ascites. S/P paracentesis today, no SBP. Received albumin 20 mg IV x 1.  No clinical evidence of hepatic encephalopathy.  T. Bili 11.9 -> 12.9. Alk phos 120. AST 60. ALT 23. Hemodynamically stable. -Restart Lasix 80mg  every day and Amiloride 5mg  every day. -2 g low-sodium diet -Continue Pantoprazole 40 mg p.o. daily -CBC, BMP, hepatic panel including direct bili and INR in a.m. -He will likely require another therapeutic paracentesis within the next 1 to 2 days   Abdominal pain, acute onset abdominal pain which started on 4/9 and progressively worsening at home. Pain worse in the lower abdomen, but also a band of pain across higher abdomen and flanks.  CTAP 03/18/2024 showed enterocolitis, query hepatic colopathy. -Pain management per the hospitalist -Consider repeat CTAP if abdominal pains  worsen     Hematochezia +/- melena. Octreotide discontinued. EGD 03/21/2023 showed grade I esophageal varices, portal hypertensive gastropathy. Last past a small black stool on 4/13.  - Last dose of Rocephin today   Acute on chronic anemia secondary to cirrhosis and GI bleed with hematochezia +/- melena. - CBC in a.m. - Transfuse for hemoglobin level less than 7   Portal vein thrombosis with cavernous transformation.  Xarelto on hold. -Defer Xarelto restart recommendations to Dr. Meridee Score   Coagulopathy secondary to cirrhosis.  INR 2.9 -> 3.1 -> 2.8 -> 2.7. Received vitamin K 5mg   IV x 1 dose today prior to paracentesis. -Daily PT/INR   Thrombocytopenia secondary to cirrhosis -CBC in am   AKI -BMP in am   Hyponatremia secondary to cirrhosis.   -BMP in am      Principal Problem:   Decompensated cirrhosis (HCC) Active Problems:   GAD (generalized anxiety disorder)   Hypokalemia   Thrombocytopenia (HCC)   Major depressive disorder, recurrent (HCC)   Portal vein thrombosis   GI bleeding   Intractable abdominal pain   Enterocolitis   Generalized abdominal pain   Secondary esophageal varices without bleeding (HCC)   Portal hypertensive gastropathy (HCC)     LOS: 4 days   Jakobie Henslee M Kennedy-Smith  03/22/2024, 2:43 PM

## 2024-03-22 NOTE — Progress Notes (Signed)
 Progress Note   Patient: Nathaniel Warren QMV:[id number] DOB: [birth year] DOA: 03/18/2024     4 DOS: the patient was seen and examined on 03/22/2024   Brief hospital course:  TARRON KROLAK is a 58 y.o. male with medical history significant for alcoholism in remission, liver cirrhosis with ascites, portal venous thrombosis on Xarelto, depression, and anxiety who presents with abdominal pain and rectal bleeding.   Patient reports that he has been experiencing increased pain throughout his abdomen but mainly across the lower quadrants.  He experienced bright red blood per rectum yesterday which she describes as a large volume.  He has had dark red stool since then.  Stool has been looser than usual but not watery.  He had a paracentesis 2 weeks ago but notes that his abdomen has become distended again.  He reports subjective fever at home.  Pt was recently started on xarelto for portal vein thrombosis to prevent propagation of the thrombosis.This has been held.   ED Course: Upon arrival to the ED, patient is found to be afebrile and saturating well on room air with normal heart rate and stable BP.  Labs are most notable for sodium 130, potassium 3.0, creatinine 1.25, albumin 2.0, total bilirubin 15.7, WBC 18,200, hemoglobin 9.5, platelets 55,000, positive FOBT, and INR 2.9.   GI was consulted by the ED physician, blood cultures were collected in the ED, and the patient was treated with fentanyl, IV Protonix, IV potassium, Rocephin, and Flagyl.  The patient has been evaluated by Dr. Barron Alvine for GI. He is found to have a MELD score of 34. IR was consulted for paracentesis, although this was put on hold due to the patient's INR of 3.1 on 03/19/2024 and 2.8 on 03/20/2024. The recommendation is to limit the amount taken to 3L. The patient is to receive IV albumin with the procedure.  The patient underwent EGD on 03/20/2024 that demonstrated Grade 1 esophageal varices, gastropathy, portal hypertension.  Recommendation is for IV vitamin K to reduce INR so that paracentesis may be performed. Advance diet as tolerated, continue present medications, continue to trend serial CBC's, Resume carvedilol when BP allows. Resume lasix and amiloride when BP and renal function allows.  The patient underwent paracentesis of 5L on 03/21/2024. He was given albumin after the procedure. He stated that he continued to have discomfort in his abdomen following the procedure.  Results of the paracentesis demonstrate a transudative source for the fluid and provides no evidence for infection. Cultures are pending. The patient has been restarted on his diuretics.   Assessment and Plan:  1. GI bleeding - Pt reports BRBPR on 03/17/2024 and darker blood on 03/18/2024. He continued to have black sofgter stools on 03/19/2024 without N/V; BUN is normal but has doubled  - Hold Xarelto, continue IV PPI, follow serial CBCs, transfuse if needed   -The patient underwent EGD on 03/20/2024 that demonstrated Grade 1 esophageal varices, gastropathy, portal hypertension. Recommendation is for IV vitamin K to reduce INR so that paracentesis may be performed. Advance diet as tolerated, continue present medications, continue to trend serial CBC's, Resume carvedilol when BP allows. Resume lasix and amiloride when BP and renal function allows. - Hemoglobin has remained stable at 8.1.   2. Acute abdominal pain; decompensated cirrhosis  - MELD-Na is 33 on admission though Xarelto may be affecting INR. New calculation reveals MELD of 30.  - He was started on empiric treatment for SBP in ED given SIRS criteria, acute abdominal pain, and reported  fever at home   - Consult IR for paracentesis with fluid analysis, continue Rocephin. INR 2.8 on 03/20/2024. The patient has been given IV Vitamin K to reduce INR more quickly was ineffective. INR on the morning of 03/22/2024 was 3/2.   The patient underwent paracentesis of 5L on 03/21/2024. He was given albumin  after the procedure. He stated that he continued to have discomfort in his abdomen following the procedure.  Results of the paracentesis demonstrate a transudative source for the fluid and provides no evidence for infection. Cultures are pending. The patient has been restarted on his diuretics.  Will give another 50 gm of 25 % albumin.   3. Thrombocytopenia  - Platelets 55k on admission and 39 on 03/22/2024 in setting of chronic liver disease  - Monitor, transfuse if needed     4. Anxiety  - Seroquel     5. Portal vein thrombosis  - Xarelto has been restarted    6. Hypokalemia  - Supplement and monitor. -3.7 on 03/21/2024  I have seen and examined the patient myself. I have spent 37 minutes in his evaluation and care.     DVT prophylaxis: SCDs  Code Status: Full  Level of Care: Level of care: Progressive Family Communication: Father and daughter at bedside   Disposition Plan:  Patient is from: home  Anticipated d/c is to: TBD Anticipated d/c date is: 03/25/2024 Patient currently: Pending stable H&H, ascitic fluid analysis, pain-control, GI consultation  Consults called: GI  Admission status: Inpatient   Procedure: EGD 03/20/2024 Paracentesis with removal of 5L fluid on 03/21/2024.      Subjective: The patient appears jaundiced and acutely ill. He states that he feels a little better today.  Physical Exam: Vitals:   03/21/24 2323 03/22/24 0638 03/22/24 1029 03/22/24 1214  BP: (!) 93/56 (!) 91/53 117/63 92/64  Pulse: 61 (!) 57 76 (!) 58  Resp:  20  20  Temp:  98 F (36.7 C) 97.7 F (36.5 C) 98.1 F (36.7 C)  TempSrc:  Oral Oral Oral  SpO2:  98% 100% 96%  Weight:      Height:       Exam:  Constitutional:  The patient is awake, alert, and oriented x 3. No acute distress. Eyes:  pupils and irises appear normal Sclera are icteric ERespiratory:  No increased work of breathing. No wheezes, rales, or rhonchi No tactile fremitus Cardiovascular:  Regular rate and  rhythm No murmurs, ectopy, or gallups. No lateral PMI. No thrills. Abdomen:  Abdomen is distended and tender in the right upper quadrant and epigastrum No hernias, masses, or organomegaly Normoactive bowel sounds.  Musculoskeletal:  No cyanosis, clubbing, or edema Skin:  No rashes, lesions, ulcers palpation of skin: no induration or nodules Neurologic:  CN 2-12 intact Sensation all 4 extremities intact Psychiatric:  Mental status Mood, affect appropriate Orientation to person, place, time  judgment and insight appear intact  Data Reviewed:  CBC, CMP, INR EGD report  Family Communication: None available  Disposition: Status is: Inpatient Remains inpatient appropriate because: Need for further investigation into melena, paracentesis, volume management.  Planned Discharge Destination:  tbd    Time spent: 30 minutes  Author: Kaito Schulenburg, DO 03/22/2024 5:26 PM  For on call review [email or web address].

## 2024-03-23 DIAGNOSIS — Z7901 Long term (current) use of anticoagulants: Secondary | ICD-10-CM | POA: Diagnosis not present

## 2024-03-23 DIAGNOSIS — K7290 Hepatic failure, unspecified without coma: Secondary | ICD-10-CM | POA: Diagnosis not present

## 2024-03-23 DIAGNOSIS — K7031 Alcoholic cirrhosis of liver with ascites: Secondary | ICD-10-CM | POA: Diagnosis not present

## 2024-03-23 DIAGNOSIS — I8500 Esophageal varices without bleeding: Secondary | ICD-10-CM | POA: Diagnosis not present

## 2024-03-23 DIAGNOSIS — I864 Gastric varices: Secondary | ICD-10-CM | POA: Diagnosis not present

## 2024-03-23 DIAGNOSIS — K7460 Unspecified cirrhosis of liver: Secondary | ICD-10-CM | POA: Diagnosis not present

## 2024-03-23 LAB — CBC WITH DIFFERENTIAL/PLATELET
Abs Immature Granulocytes: 0.11 10*3/uL — ABNORMAL HIGH (ref 0.00–0.07)
Basophils Absolute: 0.1 10*3/uL (ref 0.0–0.1)
Basophils Relative: 1 %
Eosinophils Absolute: 0.6 10*3/uL — ABNORMAL HIGH (ref 0.0–0.5)
Eosinophils Relative: 7 %
HCT: 25.4 % — ABNORMAL LOW (ref 39.0–52.0)
Hemoglobin: 8.7 g/dL — ABNORMAL LOW (ref 13.0–17.0)
Immature Granulocytes: 1 %
Lymphocytes Relative: 22 %
Lymphs Abs: 2 10*3/uL (ref 0.7–4.0)
MCH: 40.3 pg — ABNORMAL HIGH (ref 26.0–34.0)
MCHC: 34.3 g/dL (ref 30.0–36.0)
MCV: 117.6 fL — ABNORMAL HIGH (ref 80.0–100.0)
Monocytes Absolute: 1.3 10*3/uL — ABNORMAL HIGH (ref 0.1–1.0)
Monocytes Relative: 14 %
Neutro Abs: 5 10*3/uL (ref 1.7–7.7)
Neutrophils Relative %: 55 %
Platelets: 63 10*3/uL — ABNORMAL LOW (ref 150–400)
RBC: 2.16 MIL/uL — ABNORMAL LOW (ref 4.22–5.81)
RDW: 18.5 % — ABNORMAL HIGH (ref 11.5–15.5)
Smear Review: NORMAL
WBC: 9.1 10*3/uL (ref 4.0–10.5)
nRBC: 0 % (ref 0.0–0.2)

## 2024-03-23 LAB — COMPREHENSIVE METABOLIC PANEL WITH GFR
ALT: 17 U/L (ref 0–44)
AST: 49 U/L — ABNORMAL HIGH (ref 15–41)
Albumin: 2.8 g/dL — ABNORMAL LOW (ref 3.5–5.0)
Alkaline Phosphatase: 132 U/L — ABNORMAL HIGH (ref 38–126)
Anion gap: 9 (ref 5–15)
BUN: 15 mg/dL (ref 6–20)
CO2: 22 mmol/L (ref 22–32)
Calcium: 8.1 mg/dL — ABNORMAL LOW (ref 8.9–10.3)
Chloride: 102 mmol/L (ref 98–111)
Creatinine, Ser: 1.1 mg/dL (ref 0.61–1.24)
GFR, Estimated: >60 mL/min (ref >60)
Glucose, Bld: 126 mg/dL — ABNORMAL HIGH (ref 70–99)
Potassium: 3.2 mmol/L — ABNORMAL LOW (ref 3.5–5.1)
Sodium: 133 mmol/L — ABNORMAL LOW (ref 135–145)
Total Bilirubin: 9.6 mg/dL — ABNORMAL HIGH (ref 0.0–1.2)
Total Protein: 5.9 g/dL — ABNORMAL LOW (ref 6.5–8.1)

## 2024-03-23 LAB — CULTURE, BLOOD (ROUTINE X 2)
Culture: NO GROWTH
Culture: NO GROWTH
Special Requests: ADEQUATE
Special Requests: ADEQUATE

## 2024-03-23 LAB — MAGNESIUM: Magnesium: 2 mg/dL (ref 1.7–2.4)

## 2024-03-23 MED ORDER — POTASSIUM CHLORIDE CRYS ER 20 MEQ PO TBCR
40.0000 meq | EXTENDED_RELEASE_TABLET | ORAL | Status: AC
  Administered 2024-03-23 (×2): 40 meq via ORAL
  Filled 2024-03-23 (×2): qty 2

## 2024-03-23 MED ORDER — AMILORIDE HCL 5 MG PO TABS
10.0000 mg | ORAL_TABLET | Freq: Every day | ORAL | Status: DC
  Administered 2024-03-24: 10 mg via ORAL
  Filled 2024-03-23: qty 2

## 2024-03-23 MED ORDER — AMILORIDE HCL 5 MG PO TABS
5.0000 mg | ORAL_TABLET | Freq: Once | ORAL | Status: AC
  Administered 2024-03-23: 5 mg via ORAL
  Filled 2024-03-23: qty 1

## 2024-03-23 NOTE — Progress Notes (Signed)
 [facility] Progress Note  CC:  Abdominal pain, BRBPR   Subjective: He is more awake today.  No nausea or vomiting.  He continues to have epigastric pain and RUQ pain which is not severe.  He has intermittent episodes of mid chest pressure/pain which last for 30 seconds and comes and goes since he was admitted.  No chest pain at this time.  No shortness of breath.  He passed a darker brown blood like stool earlier this morning.  No black stool or bright red blood per the rectum.   Objective:   EGD 03/20/2024: - Grade I esophageal varices.  - Z-line regular, 44 cm from the incisors.  - Portal hypertensive gastropathy.  - Normal examined duodenum.  - No blood noted in the visualized upper GI lumen.  - No specimens collected.  Vital signs in last 24 hours: Temp:  [97.7 F (36.5 C)-98.7 F (37.1 C)] 98.6 F (37 C) (04/16 0423) Pulse Rate:  [58-76] 59 (04/16 0423) Resp:  [18-20] 20 (04/16 0423) BP: (92-142)/(50-81) 94/50 (04/16 0423) SpO2:  [96 %-100 %] 100 % (04/16 0423) Weight:  [84.3 kg] 84.3 kg (04/16 0428) Last BM Date : 03/22/24 General: 58 year old male fatigued in no acute distress. Eyes: Moderate scleral icterus present. Heart: Regular rate and rhythm, systolic murmur. Pulm: Breath sounds clear, decreased in the bases. Abdomen: Abdomen is mildly distended with ascites but is not tense.  Epigastric and RUQ tenderness without rebound or guarding.  Positive bowel sounds to all 4 quadrants.  No palpable mass.  No bruit. Extremities: No edema. Neurologic:  Alert and  oriented x 4.  Speech is clear.  Moves all extremities equally.  No asterixis. Psych:  Alert and cooperative. Normal mood and affect.  Intake/Output from previous day: 04/15 0701 - 04/16 0700 In: 761.9 [P.O.:500; IV Piggyback:261.9] Out: 430 [Urine:430] Intake/Output this shift: No intake/output data recorded.  Lab Results: Recent Labs    03/20/24 1408 03/21/24 0504 03/23/24 0454  WBC  9.6 9.5 9.1  HGB 8.3* 8.1* 8.7*  HCT 25.0* 24.4* 25.4*  PLT 49* 39* 63*   BMET Recent Labs    03/21/24 0504 03/22/24 0455 03/23/24 0454  NA 134* 134* 133*  K 3.7 3.6 3.2*  CL 103 103 102  CO2 25 23 22   GLUCOSE 112* 90 126*  BUN 17 15 15   CREATININE 1.05 0.93 1.10  CALCIUM 7.8* 7.9* 8.1*   LFT Recent Labs    03/23/24 0454  PROT 5.9*  ALBUMIN 2.8*  AST 49*  ALT 17  ALKPHOS 132*  BILITOT 9.6*   PT/INR Recent Labs    03/22/24 0455 03/22/24 2039  LABPROT 32.9* 33.5*  INR 3.2* 3.3*   Hepatitis Panel No results for input(s): "HEPBSAG", "HCVAB", "HEPAIGM", "HEPBIGM" in the last 72 hours.  US Paracentesis Result Date: 03/21/2024 INDICATION: 58 year old male with cirrhosis, recurrent ascites. IR was requested for diagnostic and therapeutic paracentesis. EXAM: ULTRASOUND GUIDED DIAGNOSTIC AND THERAPEUTIC PARACENTESIS MEDICATIONS: 8 cc of 1% lidocaine COMPLICATIONS: None immediate. PROCEDURE: Informed written consent was obtained from the patient after a discussion of the risks, benefits and alternatives to treatment. A timeout was performed prior to the initiation of the procedure. Initial ultrasound scanning demonstrates a large amount of ascites within the right lower abdominal quadrant. The right lower abdomen was prepped and draped in the usual sterile fashion. 1% lidocaine was used for local anesthesia. Following this, a 19 gauge, 7-cm, Yueh catheter was introduced. An ultrasound image was saved for documentation  purposes. The paracentesis was performed. The catheter was removed and a dressing was applied. The patient tolerated the procedure well without immediate post procedural complication. Patient received post-procedure intravenous albumin; see nursing notes for details. FINDINGS: A total of approximately 5.0 of clear, yellow peritoneal fluid was removed. Samples were sent to the laboratory as requested by the clinical team. IMPRESSION: Successful ultrasound-guided  paracentesis yielding 5.0 liters of peritoneal fluid. Procedure performed by Buzzy Han, PA-C Electronically Signed   By: Richarda Overlie M.D.   On: 03/21/2024 16:54    Assessment / Plan:  58 year old male with decompensated alcohol associated cirrhosis with ascites, esophageal and gastric varices. Current MELD 34 - > 30. CTAP showed cirrhosis with portal hypertension, cavernous malformation of the portal vein and moderate ascites. S/P paracentesis 4/14, 5L peritoneal fluid removed, no SBP. Received albumin 20 mg IV x 1. T. Bili 10.8 -> 9.6. Alk phos 132. AST 49. ALT 17. BUN 15. Cr 0.93 -> 1.10.  No signs of hepatic encephalopathy.  Hemodynamically stable. -Continue Lasix 80mg  every day and Amiloride 5mg  every day.  Defer further diuretic recommendations to Dr. Meridee Score. -2 g low-sodium diet -Continue Pantoprazole 40 mg p.o. daily -Continue Lactulose 20 g p.o. twice daily, titrate to no more than 3-4 loose bowel movements daily -Start Xifaxan 500 mg p.o. twice daily which the patient stated was recently started at home as prescribed per [facility] hepatology NP -CBC, BMP, hepatic panel including direct bili and INR in a.m.   Abdominal pain, acute onset abdominal pain which started on 4/9 and progressively worsening at home. Pain worse in the lower abdomen, but also a band of pain across higher abdomen and flanks.  CTAP 03/18/2024 showed enterocolitis, query hepatic colopathy. -Pain management per the hospitalist -Consider repeat CTAP if abdominal pains worsen     Hematochezia +/- melena. Octreotide discontinued. EGD 03/21/2023 showed grade I esophageal varices, portal hypertensive gastropathy. Last past a small black stool on 4/13. Completed 5 days of Rocephin IV. - Continue to monitor the patient for GI bleeding   Acute on chronic anemia secondary to cirrhosis and GI bleed with hematochezia +/- melena. Hg 8.1 -> 8.7. - CBC in a.m. - Transfuse for hemoglobin level less than 7   Portal vein  thrombosis with cavernous transformation.  Xarelto on hold. -Defer Xarelto restart recommendations to Dr. Meridee Score   Coagulopathy secondary to cirrhosis.  INR 2.9 -> 3.1 -> 2.8 -> 2.7. Received vitamin K 5mg   IV x 1 dose prior to paracentesis. INR 3.3 yesterday evening.  -Daily PT/INR   Thrombocytopenia secondary to cirrhosis. PLT 39 -> 63. -CBC in am   AKI -BMP in am   Hyponatremia secondary to cirrhosis.   -BMP in am  Hypokalemia. K+ 3.2.  - KCl replacement per the hospitalist      Principal Problem:   Decompensated cirrhosis (HCC) Active Problems:   GAD (generalized anxiety disorder)   Hypokalemia   Thrombocytopenia (HCC)   Major depressive disorder, recurrent (HCC)   Portal vein thrombosis   GI bleeding   Intractable abdominal pain   Enterocolitis   Generalized abdominal pain   Secondary esophageal varices without bleeding (HCC)   Portal hypertensive gastropathy (HCC)     LOS: 5 days   Nathaniel Warren  03/23/2024, 11:07 AM

## 2024-03-23 NOTE — Plan of Care (Signed)

## 2024-03-23 NOTE — Progress Notes (Signed)
 Progress Note   Patient: Nathaniel Warren:[id number] DOB: 06/27/1966 DOA: 03/18/2024     5 DOS: the patient was seen and examined on 03/23/2024   Brief hospital course:  Nathaniel Warren is a 58 y.o. male with medical history significant for alcoholism in remission, liver cirrhosis with ascites, portal venous thrombosis on Xarelto, depression, and anxiety who presents with abdominal pain and rectal bleeding.   Patient reports that he has been experiencing increased pain throughout his abdomen but mainly across the lower quadrants.  He experienced bright red blood per rectum yesterday which she describes as a large volume.  He has had dark red stool since then.  Stool has been looser than usual but not watery.  He had a paracentesis 2 weeks ago but notes that his abdomen has become distended again.  He reports subjective fever at home.  Pt was recently started on xarelto for portal vein thrombosis to prevent propagation of the thrombosis.This has been held.   ED Course: Upon arrival to the ED, patient is found to be afebrile and saturating well on room air with normal heart rate and stable BP.  Labs are most notable for sodium 130, potassium 3.0, creatinine 1.25, albumin 2.0, total bilirubin 15.7, WBC 18,200, hemoglobin 9.5, platelets 55,000, positive FOBT, and INR 2.9.   GI was consulted by the ED physician, blood cultures were collected in the ED, and the patient was treated with fentanyl, IV Protonix, IV potassium, Rocephin, and Flagyl.  The patient has been evaluated by Dr. Karene Oto for GI. He is found to have a MELD score of 34. IR was consulted for paracentesis, although this was put on hold due to the patient's INR of 3.1 on 03/19/2024 and 2.8 on 03/20/2024. The recommendation is to limit the amount taken to 3L. The patient is to receive IV albumin with the procedure.  The patient underwent EGD on 03/20/2024 that demonstrated Grade 1 esophageal varices, gastropathy, portal hypertension.  Recommendation is for IV vitamin K to reduce INR so that paracentesis may be performed. Advance diet as tolerated, continue present medications, continue to trend serial CBC's, Resume carvedilol when BP allows. Resume lasix and amiloride when BP and renal function allows.  The patient underwent paracentesis of 5L on 03/21/2024. He was given albumin after the procedure. He stated that he continued to have discomfort in his abdomen following the procedure.  Results of the paracentesis demonstrate a transudative source for the fluid and provides no evidence for infection. Cultures are pending. The patient has been restarted on his diuretics.  Gastroenterology is starting the patient on amiloride. We will continue to monitor the patient overnight and plan for discharge to home.  Assessment and Plan:  1. GI bleeding - Pt reports BRBPR on 03/17/2024 and darker blood on 03/18/2024. He continued to have black sofgter stools on 03/19/2024 without N/V; BUN is normal but has doubled  - Hold Xarelto, continue IV PPI, follow serial CBCs, transfuse if needed   -The patient underwent EGD on 03/20/2024 that demonstrated Grade 1 esophageal varices, gastropathy, portal hypertension. Recommendation is for IV vitamin K to reduce INR so that paracentesis may be performed. Advance diet as tolerated, continue present medications, continue to trend serial CBC's, Resume carvedilol when BP allows. Resume lasix and amiloride when BP and renal function allows. - Hemoglobin has remained stable at 8.1.   2. Acute abdominal pain; decompensated cirrhosis  - MELD-Na is 33 on admission though Xarelto may be affecting INR. New calculation reveals MELD of  30.  - He was started on empiric treatment for SBP in ED given SIRS criteria, acute abdominal pain, and reported fever at home   - Consult IR for paracentesis with fluid analysis, continue Rocephin. INR 2.8 on 03/20/2024. The patient has been given IV Vitamin K to reduce INR more quickly  was ineffective. INR on the morning of 03/22/2024 was 3/2.   The patient underwent paracentesis of 5L on 03/21/2024. He was given albumin after the procedure. He stated that he continued to have discomfort in his abdomen following the procedure.  Results of the paracentesis demonstrate a transudative source for the fluid and provides no evidence for infection. Cultures are pending. The patient has been restarted on his diuretics.  The patient receive 50 gm of albumin on 03/22/2024.  The patient has been started on amiloride. We will monitor him overnight. Anticipate discharge to home tomorrow.   3. Thrombocytopenia  - Platelets 55k on admission and 39 on 03/22/2024 in setting of chronic liver disease  - Monitor, transfuse if needed     4. Anxiety  - Seroquel     5. Portal vein thrombosis  - Xarelto has been restarted    6. Hypokalemia  - Supplement and monitor. -3.7 on 03/21/2024  I have seen and examined the patient myself. I have spent 34 minutes in his evaluation and care.     DVT prophylaxis: SCDs  Code Status: Full  Level of Care: Level of care: Progressive Family Communication: Father and daughter at bedside   Disposition Plan:  Patient is from: home  Anticipated d/c is to: TBD Anticipated d/c date is: 03/25/2024 Patient currently: Pending stable H&H, ascitic fluid analysis, pain-control, GI consultation  Consults called: GI  Admission status: Inpatient   Procedure: EGD 03/20/2024 Paracentesis with removal of 5L fluid on 03/21/2024.      Subjective: The patient appears jaundiced and acutely ill. He states that he feels a little better today.  Physical Exam: Vitals:   03/22/24 1943 03/23/24 0423 03/23/24 0428 03/23/24 1238  BP: (!) 96/53 (!) 94/50  (!) 91/57  Pulse: 61 (!) 59  63  Resp: 18 20  20   Temp: 98.2 F (36.8 C) 98.6 F (37 C)  98.5 F (36.9 C)  TempSrc: Oral   Oral  SpO2: 99% 100%  97%  Weight:   84.3 kg   Height:       Exam:  Constitutional:  The  patient is awake, alert, and oriented x 3. No acute distress. Eyes:  pupils and irises appear normal Sclera are icteric ERespiratory:  No increased work of breathing. No wheezes, rales, or rhonchi No tactile fremitus Cardiovascular:  Regular rate and rhythm No murmurs, ectopy, or gallups. No lateral PMI. No thrills. Abdomen:  Abdomen is distended and tender in the right upper quadrant and epigastrum No hernias, masses, or organomegaly Normoactive bowel sounds.  Musculoskeletal:  No cyanosis, clubbing, or edema Skin:  No rashes, lesions, ulcers palpation of skin: no induration or nodules Neurologic:  CN 2-12 intact Sensation all 4 extremities intact Psychiatric:  Mental status Mood, affect appropriate Orientation to person, place, time  judgment and insight appear intact  Data Reviewed:  CBC, CMP, INR EGD report  Family Communication: None available  Disposition: Status is: Inpatient Remains inpatient appropriate because: Need for further investigation into melena, paracentesis, volume management.  Planned Discharge Destination:  tbd    Time spent: 30 minutes  Author: Cornisha Zetino, DO 03/23/2024 5:36 PM  For on call review [email or web address].

## 2024-03-24 ENCOUNTER — Telehealth: Payer: Self-pay | Admitting: Nurse Practitioner

## 2024-03-24 ENCOUNTER — Other Ambulatory Visit: Payer: Self-pay

## 2024-03-24 ENCOUNTER — Other Ambulatory Visit (HOSPITAL_COMMUNITY): Payer: Self-pay

## 2024-03-24 DIAGNOSIS — K7290 Hepatic failure, unspecified without coma: Secondary | ICD-10-CM | POA: Diagnosis not present

## 2024-03-24 DIAGNOSIS — I864 Gastric varices: Secondary | ICD-10-CM | POA: Diagnosis not present

## 2024-03-24 DIAGNOSIS — K7031 Alcoholic cirrhosis of liver with ascites: Secondary | ICD-10-CM | POA: Diagnosis not present

## 2024-03-24 DIAGNOSIS — Z7901 Long term (current) use of anticoagulants: Secondary | ICD-10-CM | POA: Diagnosis not present

## 2024-03-24 DIAGNOSIS — K7030 Alcoholic cirrhosis of liver without ascites: Secondary | ICD-10-CM

## 2024-03-24 DIAGNOSIS — K7460 Unspecified cirrhosis of liver: Secondary | ICD-10-CM | POA: Diagnosis not present

## 2024-03-24 DIAGNOSIS — I8500 Esophageal varices without bleeding: Secondary | ICD-10-CM | POA: Diagnosis not present

## 2024-03-24 DIAGNOSIS — K7689 Other specified diseases of liver: Secondary | ICD-10-CM

## 2024-03-24 LAB — AMMONIA: Ammonia: 34 umol/L (ref 9–35)

## 2024-03-24 LAB — COMPREHENSIVE METABOLIC PANEL WITH GFR
ALT: 18 U/L (ref 0–44)
AST: 51 U/L — ABNORMAL HIGH (ref 15–41)
Albumin: 2.7 g/dL — ABNORMAL LOW (ref 3.5–5.0)
Alkaline Phosphatase: 143 U/L — ABNORMAL HIGH (ref 38–126)
Anion gap: 7 (ref 5–15)
BUN: 15 mg/dL (ref 6–20)
CO2: 23 mmol/L (ref 22–32)
Calcium: 8.6 mg/dL — ABNORMAL LOW (ref 8.9–10.3)
Chloride: 107 mmol/L (ref 98–111)
Creatinine, Ser: 1.09 mg/dL (ref 0.61–1.24)
GFR, Estimated: >60 mL/min (ref >60)
Glucose, Bld: 143 mg/dL — ABNORMAL HIGH (ref 70–99)
Potassium: 3.6 mmol/L (ref 3.5–5.1)
Sodium: 137 mmol/L (ref 135–145)
Total Bilirubin: 10.1 mg/dL — ABNORMAL HIGH (ref 0.0–1.2)
Total Protein: 6.2 g/dL — ABNORMAL LOW (ref 6.5–8.1)

## 2024-03-24 LAB — PROTIME-INR
INR: 3 — ABNORMAL HIGH (ref 0.8–1.2)
Prothrombin Time: 31 s — ABNORMAL HIGH (ref 11.4–15.2)

## 2024-03-24 LAB — BODY FLUID CULTURE W GRAM STAIN: Gram Stain: NONE SEEN

## 2024-03-24 LAB — CBC
HCT: 25.9 % — ABNORMAL LOW (ref 39.0–52.0)
Hemoglobin: 8.8 g/dL — ABNORMAL LOW (ref 13.0–17.0)
MCH: 40.2 pg — ABNORMAL HIGH (ref 26.0–34.0)
MCHC: 34 g/dL (ref 30.0–36.0)
MCV: 118.3 fL — ABNORMAL HIGH (ref 80.0–100.0)
Platelets: 66 10*3/uL — ABNORMAL LOW (ref 150–400)
RBC: 2.19 MIL/uL — ABNORMAL LOW (ref 4.22–5.81)
RDW: 18.6 % — ABNORMAL HIGH (ref 11.5–15.5)
WBC: 7.4 10*3/uL (ref 4.0–10.5)
nRBC: 0.3 % — ABNORMAL HIGH (ref 0.0–0.2)

## 2024-03-24 MED ORDER — LACTULOSE 10 GM/15ML PO SOLN
20.0000 g | Freq: Two times a day (BID) | ORAL | 0 refills | Status: DC
  Filled 2024-03-24: qty 236, 4d supply, fill #0

## 2024-03-24 MED ORDER — PANTOPRAZOLE SODIUM 40 MG PO TBEC
40.0000 mg | DELAYED_RELEASE_TABLET | Freq: Every day | ORAL | 0 refills | Status: DC
  Filled 2024-03-24: qty 30, 30d supply, fill #0

## 2024-03-24 MED ORDER — FUROSEMIDE 40 MG PO TABS
80.0000 mg | ORAL_TABLET | Freq: Every day | ORAL | 0 refills | Status: DC
  Filled 2024-03-24: qty 30, 30d supply, fill #0
  Filled 2024-03-24: qty 60, 30d supply, fill #0

## 2024-03-24 MED ORDER — AMILORIDE HCL 5 MG PO TABS
10.0000 mg | ORAL_TABLET | Freq: Every day | ORAL | 0 refills | Status: DC
  Filled 2024-03-24 – 2024-05-19 (×3): qty 30, 15d supply, fill #0

## 2024-03-24 NOTE — Progress Notes (Signed)
 [facility] Progress Note  CC:  Abdominal pain, BRBPR    Subjective: No nausea or vomiting.  He continues to have epigastric and RUQ pain which is about the same as it was yesterday, not any worse.  He feels his abdominal distention is about the same and not any worse as well.  No chest pain today.  No shortness of breath.  He feels his volume of urine output has decreased today.  Objective:   EGD 03/20/2024: - Grade I esophageal varices.  - Z-line regular, 44 cm from the incisors.  - Portal hypertensive gastropathy.  - Normal examined duodenum.  - No blood noted in the visualized upper GI lumen.  - No specimens collected.  Vital signs in last 24 hours: Temp:  [98.5 F (36.9 C)-98.6 F (37 C)] 98.5 F (36.9 C) (04/17 0435) Pulse Rate:  [60-64] 60 (04/17 0435) Resp:  [16-20] 16 (04/17 0435) BP: (91-98)/(51-57) 91/51 (04/17 0435) SpO2:  [97 %-98 %] 98 % (04/17 0435) Weight:  [86.8 kg] 86.8 kg (04/17 0500) Last BM Date : 03/23/24 Weight 03/23/2024: 84.3 kg.  General: 58 year old male in no acute distress. Eyes: Scleral icterus present. Heart: Regular rate and rhythm, systolic murmur. Pulm: Sounds clear throughout. Abdomen:  Abdomen is mildly distended with ascites but is not tense.  Epigastric and RUQ tenderness without rebound or guarding.  Positive bowel sounds to all 4 quadrants.  No palpable mass.  No bruit.  Extremities:  No edema. Neurologic:  Alert and  oriented x 4. Grossly normal neurologically. Psych:  Alert and cooperative. Normal mood and affect.  Intake/Output from previous day: 04/16 0701 - 04/17 0700 In: -  Out: 575 [Urine:575] Intake/Output this shift: Total I/O In: 240 [P.O.:240] Out: 225 [Urine:225]  Lab Results: Recent Labs    03/23/24 0454 03/24/24 0821  WBC 9.1 7.4  HGB 8.7* 8.8*  HCT 25.4* 25.9*  PLT 63* 66*   BMET Recent Labs    03/22/24 0455 03/23/24 0454 03/24/24 0821  NA 134* 133* 137  K 3.6 3.2* 3.6  CL 103  102 107  CO2 23 22 23   GLUCOSE 90 126* 143*  BUN 15 15 15   CREATININE 0.93 1.10 1.09  CALCIUM 7.9* 8.1* 8.6*   LFT Recent Labs    03/24/24 0821  PROT 6.2*  ALBUMIN 2.7*  AST 51*  ALT 18  ALKPHOS 143*  BILITOT 10.1*   PT/INR Recent Labs    03/22/24 2039 03/24/24 0821  LABPROT 33.5* 31.0*  INR 3.3* 3.0*   Hepatitis Panel No results for input(s): "HEPBSAG", "HCVAB", "HEPAIGM", "HEPBIGM" in the last 72 hours.  No results found.  Patient Profile: Nathaniel Warren is a 58 y.o. male with a history of EtOH cirrhosis c/b ascites (quit EtOH 12/2021), esophageal varices, gastric varices, along with portal vein thrombosis (on Xarelto), depression/anxiety, bipolar disorder, tobacco use disorder, THC use, history of seizures, CVA and PTSD. Admitted with abdominal pain and BRBPR.   Assessment / Plan:   58 year old male with decompensated alcohol associated cirrhosis with ascites, esophageal and gastric varices. Initial MELD 34 - > 30 (calculated off Xarelto). CTAP showed cirrhosis with portal hypertension, cavernous malformation of the portal vein and moderate ascites. S/P paracentesis 4/14, 5L peritoneal fluid removed, no SBP. Received albumin 20 mg IV x 1. T. Bili 10.8 -> 9.6 -> 10.1. Alk phos 132 -> 143. AST 49 -> 51. ALT 17 -> 18. Cr 0.93 -> 1.10 -> 1.09.  Standing weight 84.3 kg  yesterday, up to 86.8 kg today. No signs of hepatic encephalopathy.  Ammonia level 34. Hemodynamically stable. -Continue Lasix 80mg  every day and Amiloride 10mg  every day, for diuretic adjustment recommendations to Dr. Brice Campi -2 g low-sodium diet -Continue Pantoprazole 40 mg p.o. daily -Continue Lactulose 20 g p.o. twice daily, titrate to no more than 3-4 loose bowel movements daily -Continue Xifaxan 500 mg p.o. twice daily which the patient stated was recently started at home as prescribed per [facility] hepatology NP   Abdominal pain, acute onset abdominal pain which started on 4/9 and progressively  worsening at home. Pain worse in the lower abdomen, but also a band of pain across higher abdomen and flanks.  CTAP 03/18/2024 showed enterocolitis, query hepatic colopathy. -Pain management per the hospitalist -Consider repeat CTAP if abdominal pains worsen     Hematochezia +/- melena. Octreotide discontinued. EGD 03/21/2023 showed grade I esophageal varices, portal hypertensive gastropathy. Last past a small black stool on 4/13. Completed 5 days of Rocephin IV. - Continue to monitor the patient for GI bleeding   Acute on chronic anemia secondary to cirrhosis and GI bleed with hematochezia +/- melena. Hg 8.1 -> 8.7 -> 8.8. - CBC in a.m. - Transfuse for hemoglobin level less than 7   Portal vein thrombosis with cavernous transformation.  Xarelto on hold. -Defer Xarelto restart recommendations to Dr. Brice Campi   Coagulopathy secondary to cirrhosis.  INR 2.9 -> 3.1 -> 2.8 -> 2.7. Received vitamin K 5mg   IV x 1 dose prior to paracentesis. Today INR 3.0.  -Daily PT/INR   Thrombocytopenia secondary to cirrhosis. PLT 39 -> 63 -> 66. -CBC in am   Hypokalemia. K+ 3.2.  - KCl replacement per the hospitalist    Principal Problem:   Decompensated cirrhosis (HCC) Active Problems:   GAD (generalized anxiety disorder)   Hypokalemia   Thrombocytopenia (HCC)   Major depressive disorder, recurrent (HCC)   Portal vein thrombosis   GI bleeding   Intractable abdominal pain   Enterocolitis   Generalized abdominal pain   Secondary esophageal varices without bleeding (HCC)   Portal hypertensive gastropathy (HCC)     LOS: 6 days   Tory Freiberg  03/24/2024, 11:25 AM

## 2024-03-24 NOTE — Telephone Encounter (Signed)
Order and reminder in epic.

## 2024-03-24 NOTE — Discharge Summary (Signed)
 Physician Discharge Summary   Patient: Nathaniel Warren MRN: [id number] DOB: 12/01/1966  Admit date:     03/18/2024  Discharge date: 03/24/24  Discharge Physician: Junita Oliva   PCP: Jerrlyn Morel, NP   Recommendations at discharge:    Discharge to home Follow up with PA Drezik in one week. Have CMP drawn on visit with PA Drezik to be reported to Dr. Rexie Catena and PA Drezik. Follow up with Dr. Rexie Catena in one week.  Weigh self daily and report weights to PA Drezik and Dr Rexie Catena next week.  Discharge Diagnoses: Principal Problem:   Decompensated cirrhosis (HCC) Active Problems:   Thrombocytopenia (HCC)   GAD (generalized anxiety disorder)   Hypokalemia   Major depressive disorder, recurrent (HCC)   Portal vein thrombosis   GI bleeding   Intractable abdominal pain   Enterocolitis   Generalized abdominal pain   Secondary esophageal varices without bleeding (HCC)   Portal hypertensive gastropathy (HCC)  Resolved Problems:   * No resolved hospital problems. *  Hospital Course: Nathaniel Warren is a 58 y.o. male with medical history significant for alcoholism in remission, liver cirrhosis with ascites, portal venous thrombosis on Xarelto, depression, and anxiety who presents with abdominal pain and rectal bleeding.   Patient reports that he has been experiencing increased pain throughout his abdomen but mainly across the lower quadrants.  He experienced bright red blood per rectum yesterday which she describes as a large volume.  He has had dark red stool since then.  Stool has been looser than usual but not watery.  He had a paracentesis 2 weeks ago but notes that his abdomen has become distended again.  He reports subjective fever at home.   Pt was recently started on xarelto for portal vein thrombosis to prevent propagation of the thrombosis.This has been held.   ED Course: Upon arrival to the ED, patient is found to be afebrile and saturating well on room air with normal  heart rate and stable BP.  Labs are most notable for sodium 130, potassium 3.0, creatinine 1.25, albumin 2.0, total bilirubin 15.7, WBC 18,200, hemoglobin 9.5, platelets 55,000, positive FOBT, and INR 2.9.   GI was consulted by the ED physician, blood cultures were collected in the ED, and the patient was treated with fentanyl, IV Protonix, IV potassium, Rocephin, and Flagyl.   The patient has been evaluated by Dr. Karene Oto for GI. He is found to have a MELD score of 34. IR was consulted for paracentesis, although this was put on hold due to the patient's INR of 3.1 on 03/19/2024 and 2.8 on 03/20/2024. The recommendation is to limit the amount taken to 3L. The patient is to receive IV albumin with the procedure.   The patient underwent EGD on 03/20/2024 that demonstrated Grade 1 esophageal varices, gastropathy, portal hypertension. Recommendation is for IV vitamin K to reduce INR so that paracentesis may be performed. Advance diet as tolerated, continue present medications, continue to trend serial CBC's, Resume carvedilol when BP allows. Resume lasix and amiloride when BP and renal function allows.   The patient underwent paracentesis of 5L on 03/21/2024. He was given albumin after the procedure. He stated that he continued to have discomfort in his abdomen following the procedure.   Results of the paracentesis demonstrate a transudative source for the fluid and provides no evidence for infection. Cultures are pending. The patient has been restarted on his diuretics.   Gastroenterology is starting the patient on amiloride. We will continue to  monitor the patient overnight and plan for discharge to home.  The patient is cleared for discharge to home. He will be discharged on amiloride 10 mg daily and lasix 80 mg daily, as well as lactulose 20 mg bid. His carvedilol, nadolol, spironolactone, and eplerenone have been stopped due to the patient's low/normal blood pressures. He will report his weights to Dr.  Griselda Miner office and to PA Drezik next week. He will also follow up with PA Drezik and have CMP drawn that will be reported to both PA Drezik and Mansourati's office as well.   Assessment and Plan: 1. GI bleeding - Pt reports BRBPR on 03/17/2024 and darker blood on 03/18/2024. He continued to have black sofgter stools on 03/19/2024 without N/V; BUN is normal but has doubled  - Hold Xarelto, continue IV PPI, follow serial CBCs, transfuse if needed   -The patient underwent EGD on 03/20/2024 that demonstrated Grade 1 esophageal varices, gastropathy, portal hypertension. Recommendation is for IV vitamin K to reduce INR so that paracentesis may be performed. Advance diet as tolerated, continue present medications, continue to trend serial CBC's, Resume carvedilol when BP allows. Resume lasix and amiloride when BP and renal function allows. - Hemoglobin has remained stable at 8.1.   2. Acute abdominal pain; decompensated cirrhosis  - MELD-Na is 33 on admission though Xarelto may be affecting INR. New calculation reveals MELD of 30.  - He was started on empiric treatment for SBP in ED given SIRS criteria, acute abdominal pain, and reported fever at home   - Consult IR for paracentesis with fluid analysis, continue Rocephin. INR 2.8 on 03/20/2024. The patient has been given IV Vitamin K to reduce INR more quickly was ineffective. INR on the morning of 03/22/2024 was 3/2.    The patient underwent paracentesis of 5L on 03/21/2024. He was given albumin after the procedure. He stated that he continued to have discomfort in his abdomen following the procedure.   Results of the paracentesis demonstrate a transudative source for the fluid and provides no evidence for infection. Cultures are pending. The patient has been restarted on his diuretics.   The patient receive 50 gm of albumin on 03/22/2024.   The patient has been started on amiloride 10mg . The patient has tolerated the medication well.  He has been  cleared for discharge to home by GI.   3. Thrombocytopenia  - Platelets 55k on admission and 39 on 03/22/2024 in setting of chronic liver disease  - Monitor, transfuse if needed     4. Anxiety  - Seroquel     5. Portal vein thrombosis  - Xarelto has been restarted    6. Hypokalemia  - Supplement and monitor. -3.7 on 03/21/2024     DVT prophylaxis: SCDs  Code Status: Full  Level of Care: Level of care: Progressive Family Communication: Father and daughter at bedside   Disposition Plan:  Patient is from: home  Anticipated d/c is to: TBD Anticipated d/c date is: 03/25/2024 Patient currently: Pending stable H&H, ascitic fluid analysis, pain-control, GI consultation  Consults called: GI  Admission status: Inpatient   Procedure: EGD 03/20/2024 Paracentesis with removal of 5L fluid on 03/21/2024.        Consultants: GI Procedures performed: Paracentesis  Disposition: Home Diet recommendation:  Discharge Diet Orders (From admission, onward)     Start     Ordered   03/24/24 0000  Diet - low sodium heart healthy        03/24/24 1422  Cardiac diet DISCHARGE MEDICATION: Allergies as of 03/24/2024       Reactions   Nsaids Other (See Comments)   Cannot take due to liver condition        Medication List     STOP taking these medications    carvedilol 3.125 MG tablet Commonly known as: COREG   cyclobenzaprine 5 MG tablet Commonly known as: FLEXERIL   eplerenone 25 MG tablet Commonly known as: INSPRA   nadolol 20 MG tablet Commonly known as: CORGARD   ondansetron 4 MG tablet Commonly known as: ZOFRAN   oxyCODONE 5 MG immediate release tablet Commonly known as: Roxicodone   PARoxetine 10 MG tablet Commonly known as: PAXIL   PARoxetine 20 MG tablet Commonly known as: PAXIL   potassium chloride SA 20 MEQ tablet Commonly known as: KLOR-CON M   spironolactone 50 MG tablet Commonly known as: Aldactone   traMADol 50 MG tablet Commonly known  as: ULTRAM       TAKE these medications    aMILoride 5 MG tablet Commonly known as: MIDAMOR Take 2 tablets (10 mg total) by mouth daily. Start taking on: March 25, 2024 What changed:  how much to take how to take this when to take this   furosemide 80 MG tablet Commonly known as: LASIX Take 1 tablet (80 mg total) by mouth daily. Start taking on: March 25, 2024 What changed:  medication strength how much to take Another medication with the same name was removed. Continue taking this medication, and follow the directions you see here.   lactulose 10 GM/15ML solution Commonly known as: CHRONULAC Take 30 mLs (20 g total) by mouth 2 (two) times daily. What changed: when to take this   nicotine 14 mg/24hr patch Commonly known as: Nicotine Step 2 Place 1 patch on the skin daily as needed (for nicotine craving if patient desires).   ondansetron 4 MG disintegrating tablet Commonly known as: ZOFRAN-ODT Take 1 tablet (4 mg total) by mouth every 6 (six) hours as needed for vomiting.   pantoprazole 40 MG tablet Commonly known as: PROTONIX Take 1 tablet (40 mg total) by mouth daily. Start taking on: March 25, 2024   QUEtiapine 25 MG tablet Commonly known as: SEROQUEL TAKE 1 TABLET BY MOUTH AT BEDTIME NEED  OFFICE  VISIT What changed: Another medication with the same name was removed. Continue taking this medication, and follow the directions you see here.   Xarelto 20 MG Tabs tablet Generic drug: rivaroxaban Take 1 tablet (20 mg total) by mouth daily.        Discharge Exam: Filed Weights   03/18/24 2031 03/23/24 0428 03/24/24 0500  Weight: 86.1 kg 84.3 kg 86.8 kg   Exam:  Constitutional:  The patient is awake, alert, and oriented x 3. No acute distress. Eyes:  Scleral icterus Respiratory:  No increased work of breathing. No wheezes, rales, or rhonchi No tactile fremitus Cardiovascular:  Regular rate and rhythm No murmurs, ectopy, or gallups. No lateral PMI.  No thrills. Abdomen:  Abdomen is soft, non-tender, non-distended No hernias, masses, or organomegaly Normoactive bowel sounds.  Musculoskeletal:  No cyanosis, clubbing, or edema Skin:  No rashes, lesions, ulcers palpation of skin: no induration or nodules Neurologic:  CN 2-12 intact Sensation all 4 extremities intact Psychiatric:  Mental status Mood, affect appropriate Orientation to person, place, time  judgment and insight appear intact   Condition at discharge: fair  The results of significant diagnostics from this hospitalization (including imaging, microbiology, ancillary and  laboratory) are listed below for reference.   Imaging Studies: US Paracentesis Result Date: 03/21/2024 INDICATION: 58 year old male with cirrhosis, recurrent ascites. IR was requested for diagnostic and therapeutic paracentesis. EXAM: ULTRASOUND GUIDED DIAGNOSTIC AND THERAPEUTIC PARACENTESIS MEDICATIONS: 8 cc of 1% lidocaine COMPLICATIONS: None immediate. PROCEDURE: Informed written consent was obtained from the patient after a discussion of the risks, benefits and alternatives to treatment. A timeout was performed prior to the initiation of the procedure. Initial ultrasound scanning demonstrates a large amount of ascites within the right lower abdominal quadrant. The right lower abdomen was prepped and draped in the usual sterile fashion. 1% lidocaine was used for local anesthesia. Following this, a 19 gauge, 7-cm, Yueh catheter was introduced. An ultrasound image was saved for documentation purposes. The paracentesis was performed. The catheter was removed and a dressing was applied. The patient tolerated the procedure well without immediate post procedural complication. Patient received post-procedure intravenous albumin; see nursing notes for details. FINDINGS: A total of approximately 5.0 of clear, yellow peritoneal fluid was removed. Samples were sent to the laboratory as requested by the clinical team.  IMPRESSION: Successful ultrasound-guided paracentesis yielding 5.0 liters of peritoneal fluid. Procedure performed by Buzzy Han, PA-C Electronically Signed   By: Richarda Overlie M.D.   On: 03/21/2024 16:54   CT ABDOMEN PELVIS W CONTRAST Result Date: 03/18/2024 CLINICAL DATA:  Abdominal pain, acute, nonlocalized abd pain with bil flank pain, Pt is on Liver transplant list, states he has not been able to eat due to abd pain, orthostatic. Pt states he has been having blood in stools that are now black in color. Pt is jaundice EXAM: CT ABDOMEN AND PELVIS WITH CONTRAST TECHNIQUE: Multidetector CT imaging of the abdomen and pelvis was performed using the standard protocol following bolus administration of intravenous contrast. RADIATION DOSE REDUCTION: This exam was performed according to the departmental dose-optimization program which includes automated exposure control, adjustment of the mA and/or kV according to patient size and/or use of iterative reconstruction technique. CONTRAST:  OMNIPAQUE IOHEXOL 300 MG/ML  SOLN COMPARISON:  CT angiography chest 02/05/2024, CT renal 06/08/2023, CT abdomen pelvis 08/05/2023 FINDINGS: Lower chest: Small left pleural effusion. Hepatobiliary: Nodular hepatic contour. No focal liver abnormality. Calcified gallstone noted within the gallbladder lumen. No gallbladder wall thickening or pericholecystic fluid. No biliary dilatation. Pancreas: No focal lesion. Normal pancreatic contour. No surrounding inflammatory changes. No main pancreatic ductal dilatation. Spleen: Likely splenosis within the left upper quadrant. Adrenals/Urinary Tract: No adrenal nodule bilaterally. Bilateral kidneys enhance symmetrically. No hydronephrosis. No hydroureter. The urinary bladder is unremarkable. Stomach/Bowel: Stomach is within normal limits. No evidence of bowel wall dilatation. Diffuse small and large bowel wall hypodense thickening and pericolonic fat stranding. Colonic diverticulosis.  Appendix appears normal. Vascular/Lymphatic: Venous collaterals noted. The main portal vein is not identified with multiple venous collaterals in the porta hepatics consistent with cavernous malformation of the portal vein. No abdominal aorta or iliac aneurysm. Severe atherosclerotic plaque of the aorta and its branches. No abdominal, pelvic, or inguinal lymphadenopathy. Reproductive: Prostate is unremarkable. Other: Interval increase in moderate volume free fluid. No intraperitoneal free gas. No organized fluid collection. Musculoskeletal: No abdominal wall hernia or abnormality. Couple tiny fluid-filled supraumbilical ventral hernias again noted (3:40). No suspicious lytic or blastic osseous lesions. No acute displaced fracture. Multilevel degenerative changes of the spine. IMPRESSION: 1. Enterocolitis. 2. Colonic diverticulosis with no acute diverticulitis. 3. Cirrhosis with portal hypertension and cavernous malformation of the portal vein. 4. Interval increase in moderate volume simple free  fluid ascites. 5. Cholelithiasis with no CT evidence of acute cholecystitis. 6.  Couple tiny fluid-filled supraumbilical ventral hernias. Electronically Signed   By: Morgane  Naveau M.D.   On: 03/18/2024 17:31   IR Paracentesis Result Date: 03/01/2024 INDICATION: Patient with a history of cirrhosis with recurrent ascites. Interventional radiology asked to perform a therapeutic paracentesis. EXAM: ULTRASOUND GUIDED PARACENTESIS MEDICATIONS: 1% lidocaine 15 mL COMPLICATIONS: None immediate. PROCEDURE: Informed written consent was obtained from the patient after a discussion of the risks, benefits and alternatives to treatment. A timeout was performed prior to the initiation of the procedure. Initial ultrasound scanning demonstrates a large amount of ascites within the right lower abdominal quadrant. The right lower abdomen was prepped and draped in the usual sterile fashion. 1% lidocaine was used for local anesthesia.  Following this, a 19 gauge, 7-cm, Yueh catheter was introduced. An ultrasound image was saved for documentation purposes. The paracentesis was performed. The catheter was removed and a dressing was applied. The patient tolerated the procedure well without immediate post procedural complication. FINDINGS: A total of approximately 3.1 L of clear yellow fluid was removed. IMPRESSION: Successful ultrasound-guided paracentesis yielding 3.1 liters of peritoneal fluid. Procedure performed by Jetta Morrow NP PLAN: Patient is currently undergoing workup for liver transplant with [facility] in [place]. Electronically Signed   By: Erica Hau M.D.   On: 03/01/2024 08:07    Microbiology: Results for orders placed or performed during the hospital encounter of 03/18/24  Culture, blood (Routine X 2) w Reflex to ID Panel     Status: None   Collection Time: 03/18/24  4:00 PM   Specimen: BLOOD  Result Value Ref Range Status   Specimen Description   Final    BLOOD LEFT ANTECUBITAL Performed at [facility], [address] [place], [place] [address]    Special Requests   Final    BOTTLES DRAWN AEROBIC AND ANAEROBIC Blood Culture adequate volume Performed at [facility], [address], [place], [place] [address]    Culture   Final    NO GROWTH 5 DAYS Performed at [facility], [address], [place], [place] [address]    Report Status 03/23/2024 FINAL  Final  Culture, blood (Routine X 2) w Reflex to ID Panel     Status: None   Collection Time: 03/18/24  4:02 PM   Specimen: BLOOD  Result Value Ref Range Status   Specimen Description   Final    BLOOD RIGHT ANTECUBITAL Performed at [facility], [address], [place], [place] [address]    Special Requests   Final    BOTTLES DRAWN AEROBIC AND ANAEROBIC Blood Culture adequate volume Performed at [facility], [address] [place], [place] [address]    Culture    Final    NO GROWTH 5 DAYS Performed at [facility], [address], [place], [place] [address]    Report Status 03/23/2024 FINAL  Final  Body fluid culture w Gram Stain     Status: None   Collection Time: 03/21/24 10:16 AM   Specimen: PATH Cytology Peritoneal fluid  Result Value Ref Range Status   Specimen Description   Final    PERITONEAL Performed at [facility], [address] [place], [place] [address]    Special Requests   Final    NONE Performed at [facility], [address], [place], [place] [address]    Gram Stain NO  WBC SEEN NO ORGANISMS SEEN   Final   Culture   Final    NO GROWTH 3 DAYS Performed at [facility], [address] [place], [place] [address]    Report Status 03/24/2024 FINAL  Final    Labs: CBC: Recent Labs  Lab 03/19/24 2331 03/20/24 1408 03/21/24 0504 03/23/24 0454 03/24/24 0821  WBC 11.2* 9.6 9.5 9.1 7.4  NEUTROABS  --   --  5.3 5.0  --   HGB 8.1* 8.3* 8.1* 8.7* 8.8*  HCT 24.1* 25.0* 24.4* 25.4* 25.9*  MCV 114.8* 116.3* 119.0* 117.6* 118.3*  PLT 53* 49* 39* 63* 66*   Basic Metabolic Panel: Recent Labs  Lab 03/19/24 0534 03/20/24 0542 03/21/24 0504 03/22/24 0455 03/23/24 0454 03/24/24 0821  NA 132* 133* 134* 134* 133* 137  K 3.3* 3.3* 3.7 3.6 3.2* 3.6  CL 98 101 103 103 102 107  CO2 27 27 25 23 22 23   GLUCOSE 111* 115* 112* 90 126* 143*  BUN 22* 20 17 15 15 15   CREATININE 1.23 1.13 1.05 0.93 1.10 1.09  CALCIUM 8.1* 7.7* 7.8* 7.9* 8.1* 8.6*  MG 1.7  --   --   --  2.0  --    Liver Function Tests: Recent Labs  Lab 03/20/24 0542 03/21/24 0504 03/22/24 0455 03/23/24 0454 03/24/24 0821  AST 60* 60* 51* 49* 51*  ALT 22 23 19 17 18   ALKPHOS 121 120 126 132* 143*  BILITOT 11.9* 12.9* 10.8* 9.6* 10.1*  PROT 5.5* 5.8* 5.6* 5.9* 6.2*  ALBUMIN 1.5* 2.0* 2.4* 2.8* 2.7*   CBG: Recent Labs  Lab 03/19/24 1124  GLUCAP 98    Discharge time spent: greater than 30  minutes.  Signed: Sutton Plake, DO [facility] 03/24/2024

## 2024-03-24 NOTE — Telephone Encounter (Signed)
 Nathaniel Warren/Nathaniel Warren, patient with cirrhosis will be discharged from [facility] to home today.  Please contact the patient and provide him with a lab order for a CMP to be done in one week in our lab. THX.

## 2024-03-24 NOTE — Telephone Encounter (Signed)
 Nathaniel Warren, patient with cirrhosis to be discharged from [facility] today.  Please contact the patient and schedule him for a follow-up appoint with Dr. Brice Campi or POD C APP in 3 to 4 weeks.  Thank you

## 2024-03-25 ENCOUNTER — Telehealth: Payer: Self-pay

## 2024-03-25 NOTE — Transitions of Care (Post Inpatient/ED Visit) (Signed)
 03/25/2024  Name: Nathaniel Warren MRN: [id number] DOB: 06-23-1966  Today's TOC FU Call Status: Today's TOC FU Call Status:: Successful TOC FU Call Completed TOC FU Call Complete Date: 03/25/24 Patient's Name and Date of Birth confirmed.  Transition Care Management Follow-up Telephone Call Date of Discharge: 03/24/24 Discharge Facility: [facility]) Type of Discharge: Inpatient Admission Primary Inpatient Discharge Diagnosis:: Decompensation cirrhosis; Enterocolitis Any questions or concerns?: Yes Patient Questions/Concerns:: About taking Potassium, not on discharge Patient Questions/Concerns Addressed:  (Reviewed and patient to follow up with Drazek)  Items Reviewed: Did you receive and understand the discharge instructions provided?: Yes Medications obtained,verified, and reconciled?: Yes (Medications Reviewed) Any new allergies since your discharge?: No Dietary orders reviewed?: Yes Type of Diet Ordered:: Heart Healthy low sodium Do you have support at home?: Yes People in Home [RPT]: alone, child(ren), adult Name of Support/Comfort Primary Source: Crystal daughter, Debria Fang best friend roommate  Medications Reviewed Today: Medications Reviewed Today     Reviewed by Jamie Mccoy, RN (Registered Nurse) on 03/25/24 at [phone]  Med List Status: <None>   Medication Order Taking? Sig Documenting Provider Last Dose Status Informant  aMILoride  (MIDAMOR ) 5 MG tablet [id number] Yes Take 2 tablets (10 mg total) by mouth daily. Swayze, Ava, DO Taking Active   furosemide  (LASIX ) 40 MG tablet [id number] Yes Take 2 tablets (80 mg total) by mouth daily. Swayze, Ava, DO Taking Active   lactulose  (CHRONULAC ) 10 GM/15ML solution [id number] Yes Take 30 mLs (20 g total) by mouth 2 (two) times daily. Swayze, Ava, DO Taking Active   nicotine  (NICOTINE  STEP 2) 14 mg/24hr patch [id number] Yes Place 1 patch on the skin daily as needed (for nicotine  craving if patient desires).  Taking Active Self            Med Note Guido Leeks, KATHY N   Sat Mar 19, 2024  5:42 PM) No patch are on at this time- per the patient  ondansetron  (ZOFRAN -ODT) 4 MG disintegrating tablet [id number]  Take 1 tablet (4 mg total) by mouth every 6 (six) hours as needed for vomiting.  Patient not taking: Reported on 03/19/2024   Kennedy-Smith, Colleen M, NP  Active Self  pantoprazole  (PROTONIX ) 40 MG tablet [id number] Yes Take 1 tablet (40 mg total) by mouth daily. Swayze, Ava, DO Taking Active   QUEtiapine  (SEROQUEL ) 25 MG tablet [id number] Yes TAKE 1 TABLET BY MOUTH AT BEDTIME NEED  OFFICE  VISIT Jerrlyn Morel, NP Taking Active Self           Med Note Guido Leeks, Lavonia Powers   Sat Mar 19, 2024  5:19 PM)    rivaroxaban  (XARELTO ) 20 MG TABS tablet [id number] Yes Take 1 tablet (20 mg total) by mouth daily.  Taking Active Self            Home Care and Equipment/Supplies: Were Home Health Services Ordered?: No Any new equipment or medical supplies ordered?: No  Functional Questionnaire: Do you need assistance with meal preparation?: No Do you need assistance with eating?: No Do you have difficulty maintaining continence: Yes (due to Lactulose ) Do you need assistance with getting out of bed/getting out of a chair/moving?: No Do you have difficulty managing or taking your medications?: No  Follow up appointments reviewed: PCP Follow-up appointment confirmed?:  (Patient states he had a voice mail message from [facility] that he will call back for, he iwll call Abbey Hobby) MD Provider Line Number:[phone] Given: No Do you need transportation to your follow-up appointment?: No Do  you understand care options if your condition(s) worsen?: Yes-patient verbalized understanding  SDOH Interventions Today    Flowsheet Row Most Recent Value  SDOH Interventions   Food Insecurity Interventions Intervention Not Indicated  Housing Interventions Intervention Not Indicated  Transportation Interventions Intervention Not Indicated  Utilities  Interventions Intervention Not Indicated       Goals Addressed             This Visit's Progress    COMPLETED: VBCI Transitions of Care (TOC) Care Plan       Problems:  Recent Hospitalization for treatment of  Decompensated Cirrhosis Diet/Nutrition/Food Resources Hearth Healthy; low sodium  Goal:  Over the next 30 days, the patient will not experience hospital readmission Continue to watch sodium on labels of foods, weigh daily and record for upcoming appointment Pain management for abdominal pain and ankle and foot pain  Interventions:  Transitions of Care: Doctor Visits  - discussed the importance of doctor visits Communication with Duey Ghent PA and Abbey Hobby, NP re: pain management medications  Patient Self Care Activities:  Attend all scheduled provider appointments Call provider office for new concerns or questions  Participate in Transition of Care Program/Attend [facility] scheduled calls  Plan:  Telephone follow up appointment with care management team member scheduled for:  April 01, 2024 at 1:00pm  [facility] week2        [place], RN, BSN, CCM [place]  [facility], [facility] RN Care Manager Direct Dial: [phone]

## 2024-03-29 ENCOUNTER — Other Ambulatory Visit (HOSPITAL_COMMUNITY): Payer: Self-pay

## 2024-03-31 ENCOUNTER — Other Ambulatory Visit (INDEPENDENT_AMBULATORY_CARE_PROVIDER_SITE_OTHER)

## 2024-03-31 DIAGNOSIS — K7030 Alcoholic cirrhosis of liver without ascites: Secondary | ICD-10-CM

## 2024-03-31 LAB — COMPREHENSIVE METABOLIC PANEL WITH GFR
ALT: 19 U/L (ref 0–53)
AST: 58 U/L — ABNORMAL HIGH (ref 0–37)
Albumin: 2.8 g/dL — ABNORMAL LOW (ref 3.5–5.2)
Alkaline Phosphatase: 234 U/L — ABNORMAL HIGH (ref 39–117)
BUN: 11 mg/dL (ref 6–23)
CO2: 28 meq/L (ref 19–32)
Calcium: 8.4 mg/dL (ref 8.4–10.5)
Chloride: 100 meq/L (ref 96–112)
Creatinine, Ser: 1.17 mg/dL (ref 0.40–1.50)
GFR: 69.87 mL/min (ref >60.00)
Glucose, Bld: 94 mg/dL (ref 70–99)
Potassium: 3.2 meq/L — ABNORMAL LOW (ref 3.5–5.1)
Sodium: 136 meq/L (ref 135–145)
Total Bilirubin: 9.1 mg/dL — ABNORMAL HIGH (ref 0.2–1.2)
Total Protein: 6.7 g/dL (ref 6.0–8.3)

## 2024-04-01 ENCOUNTER — Other Ambulatory Visit: Payer: Self-pay | Admitting: *Deleted

## 2024-04-01 NOTE — Transitions of Care (Post Inpatient/ED Visit) (Signed)
 Transition of Care week 2  Visit Note  04/01/2024  Name: Nathaniel Warren MRN: [id number]          DOB: October 02, 1966  Situation: Patient enrolled in [facility] 30-day program. Visit completed with patient by telephone.   Background: Patient reports feels "okay" today, has all medications and taking as prescribed, states had bright red bleeding at hs for the last two nights, no bleeding during the day or when standing or sitting, Dr. Chauncey Cora ([facility]) notified by RN Care Manager, spoke with nurse Alston Jerry. RN Care Manager instructed pt to call 911, go to ED as needed.  Past Medical History:  Diagnosis Date   Cirrhosis (HCC)    Depression    Drug overdose 10/20/2017   Drug overdose, intentional self-harm, initial encounter (HCC) 10/20/2017   Drug overdose, intentional, initial encounter (HCC) 10/20/2017   Hernia, abdominal    Liver failure (HCC)    Seizures (HCC)    Stroke (HCC)    Tobacco abuse     Assessment: Patient Reported Symptoms: Cognitive Cognitive Status: Able to follow simple commands, Alert and oriented to person, place, and time      Neurological Neurological Review of Symptoms: No symptoms reported    HEENT HEENT Symptoms Reported: No symptoms reported      Cardiovascular Cardiovascular Symptoms Reported: No symptoms reported Does patient have uncontrolled Hypertension?: No    Respiratory Respiratory Symptoms Reported: No symptoms reported    Endocrine Patient reports the following symptoms related to hypoglycemia or hyperglycemia : No symptoms reported Is patient diabetic?: No    Gastrointestinal Gastrointestinal Symptoms Reported:  (diarrhea is resolved) Additional Gastrointestinal Details: Pt states for the last 2 nights he has had bright red bleeding while he sleeps, large enough amount to have to change sheets, pt states NO bleeding during the day while sitting or standing, denies any bleeding now, states he told "someone at Dr. Monroe Antigua office but no one has called  back" Gastrointestinal Conditions: Other Other Gastrointestinal Conditions: ascites-  paracentesis q 3 months and prn Gastrointestinal Self-Management Outcome: 3 (uncertain) Gastrointestinal Comment: RN Care Manager spoke with Katie at DR. Drazek's office, reported pt has bright red bleeding only at night x 2, denies any bleeding now or during the day while sitting and standing, per Alston Jerry she will give Dr. Chauncey Cora the message and call pt back today for further instruction, pt was not instructed to go to ED, pt verbalizes understanding to call 911, go to ED if neded. 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,  uses cane Musculoskeletal Conditions:  (nerve damage) Musculoskeletal Management Strategies: Routine screening Musculoskeletal Self-Management Outcome: 3 (uncertain) Falls in the past year?: Yes Number of falls in past year: 1 or less Was there an injury with Fall?: [name] Risk Category Calculator: 1 Patient Fall Risk Level: Low Fall Risk Patient at Risk for Falls Due to: History of fall(s) Fall risk Follow up: Falls evaluation completed, Falls prevention discussed  Psychosocial Psychosocial Symptoms Reported: Other (pt states when he is at  home or in a small group he has no anxiety,   denies anxiety today) Behavioral Management Strategies: Medication therapy, Adequate rest, Counseling (pt states he just started going back to his therapist) Behavioral Health Self-Management Outcome: 4 (good) Major Change/Loss/Stressor/Fears (CP): Death of  a loved one (son passed away 1 1/2 years ago) Techniques to [employer] with Loss/Stress/Change:  Counseling Quality of Family Relationships: involved Do you feel physically threatened by others?: No   There were no vitals filed for this visit.  Medications Reviewed Today   Medications were not reviewed in this encounter     Recommendation:   PCP Follow-up Specialty provider follow-up Dr. Chauncey Cora  Follow Up Plan:   Telephone follow-up 04/07/24 @ 115 pm  Cecilie Coffee [facility], BSN RN Care Manager/ Transition of Care [place]/ [facility] [phone]

## 2024-04-01 NOTE — Patient Instructions (Signed)
 Visit Information  Thank you for taking time to visit with me today. Please don't hesitate to contact me if I can be of assistance to you before our next scheduled telephone appointment.  Our next appointment is by telephone on 04/07/24 at 115 pm  Following is a copy of your care plan:   Goals Addressed             This Visit's Progress    VBCI Transitions of Care (TOC) Care Plan       Problems:  Recent Hospitalization for treatment of  Decompensated Cirrhosis Diet/Nutrition/Food Resources Hearth Healthy; low sodium 04/01/24- spoke with pt who reports he is taking lactulose  and having several bowel movements per day, diarrhea has resolved, pt states he goes in for paracentesis q 3 months and prn, pt states he is taking medications as prescribed, pt reports for the past 2 nights he has had bright red bleeding while sleeping, NO bleeding at present and never during the day while sitting or standing, pt states he spoke with someone from Dr. Monroe Antigua office ([facility] liver specialist per pt) and reported and awaiting call back.  Goal:  Over the next 30 days, the patient will not experience hospital readmission Continue to watch sodium on labels of foods, weigh daily and record for upcoming appointment Pain management for abdominal pain and ankle and foot pain  Interventions:  Transitions of Care: Doctor Visits  - discussed the importance of doctor visits Communication with Duey Ghent PA and Abbey Hobby, NP re: pain management medications Telephone call to Dr. Monroe Antigua office, spoke with Alston Jerry RN and reported patient's episodes of bleeding at hs x 2, no bleeding at present or during the day, Katie RN will let Dr. Chauncey Cora know and they will call pt back today,  pt was not instructed to go to ED, pt verbalizes understanding to call 911, go to ED if needed  Patient Self Care Activities:  Attend all scheduled provider appointments Call provider office for new concerns or questions  Participate  in Transition of Care Program/Attend [facility] scheduled calls Please call your doctor for any change in health status/ symptoms Call 911, go to ED if needed for bleeding, chest pain, shortness of breath, fainting  Plan:  Telephone follow up appointment with care management team member scheduled for:  04/07/24 @ 115 pm TOC week 3        Patient verbalizes understanding of instructions and care plan provided today and agrees to view in MyChart. Active MyChart status and patient understanding of how to access instructions and care plan via MyChart confirmed with patient.     Telephone follow up appointment with care management team member scheduled for:  Please call the care guide team at [phone] if you need to cancel or reschedule your appointment.   Please call the Suicide and Crisis Lifeline: 988 call the [place]  National Suicide Prevention Lifeline: 786-465-6330 or TTY: 901-863-6245 TTY 701-552-1829) to talk to a trained counselor call 1-800-273-TALK (toll free, 24 hour hotline) go to [facility] [address] [place] [phone]) call 911 if you are experiencing a Mental Health or Behavioral Health Crisis or need someone to talk to.  Cecilie Coffee [facility], BSN RN Care Manager/ Transition of Care [place]/ [facility] [phone]

## 2024-04-02 ENCOUNTER — Other Ambulatory Visit: Payer: Self-pay | Admitting: Nurse Practitioner

## 2024-04-02 ENCOUNTER — Other Ambulatory Visit (HOSPITAL_COMMUNITY): Payer: Self-pay

## 2024-04-02 MED ORDER — POTASSIUM CHLORIDE CRYS ER 20 MEQ PO TBCR
EXTENDED_RELEASE_TABLET | ORAL | 0 refills | Status: DC
  Filled 2024-04-02: qty 30, 29d supply, fill #0

## 2024-04-05 ENCOUNTER — Other Ambulatory Visit (HOSPITAL_COMMUNITY): Payer: Self-pay

## 2024-04-07 ENCOUNTER — Telehealth: Payer: Self-pay | Admitting: *Deleted

## 2024-04-11 ENCOUNTER — Other Ambulatory Visit: Payer: Self-pay

## 2024-04-11 ENCOUNTER — Other Ambulatory Visit (HOSPITAL_COMMUNITY): Payer: Self-pay

## 2024-04-11 MED ORDER — THIAMINE HCL 100 MG PO TABS
100.0000 mg | ORAL_TABLET | Freq: Every day | ORAL | 0 refills | Status: DC
  Filled 2024-04-11 (×2): qty 90, 90d supply, fill #0

## 2024-04-11 MED ORDER — FOLIC ACID 1 MG PO TABS
1.0000 mg | ORAL_TABLET | Freq: Every day | ORAL | 0 refills | Status: DC
  Filled 2024-04-11: qty 90, 90d supply, fill #0

## 2024-04-11 MED ORDER — SENNOSIDES 8.6 MG PO TABS
2.0000 | ORAL_TABLET | Freq: Every evening | ORAL | 0 refills | Status: DC
  Filled 2024-04-11: qty 180, 90d supply, fill #0

## 2024-04-11 MED ORDER — POLYETHYLENE GLYCOL 3350 17 G PO PACK
17.0000 g | PACK | Freq: Two times a day (BID) | ORAL | 0 refills | Status: DC
  Filled 2024-04-11: qty 28, 14d supply, fill #0

## 2024-04-11 MED ORDER — XIFAXAN 550 MG PO TABS
550.0000 mg | ORAL_TABLET | Freq: Two times a day (BID) | ORAL | 0 refills | Status: DC
  Filled 2024-04-11 – 2024-04-12 (×2): qty 180, 90d supply, fill #0

## 2024-04-12 ENCOUNTER — Other Ambulatory Visit (HOSPITAL_COMMUNITY): Payer: Self-pay

## 2024-04-12 ENCOUNTER — Telehealth: Payer: Self-pay | Admitting: *Deleted

## 2024-04-12 NOTE — Transitions of Care (Post Inpatient/ED Visit) (Signed)
   04/12/2024  Name: MAHDY LONSDALE MRN: [id number] DOB: 12-Oct-1966  Today's TOC FU Call Status: Today's TOC FU Call Status:: Unsuccessful Call (1st Attempt) Unsuccessful Call (1st Attempt) Date: 04/12/24  Attempted to reach the patient regarding the most recent Inpatient/ED visit.  Follow Up Plan: Additional outreach attempts will be made to reach the patient to complete the Transitions of Care (Post Inpatient/ED visit) call.   Cecilie Coffee [facility], BSN RN Care Manager/ Transition of Care [place]/ [facility] [phone]

## 2024-04-13 ENCOUNTER — Telehealth: Payer: Self-pay | Admitting: *Deleted

## 2024-04-13 NOTE — Transitions of Care (Post Inpatient/ED Visit) (Signed)
   04/13/2024  Name: Nathaniel Warren MRN: [id number] DOB: September 13, 1966  Today's TOC FU Call Status: Today's TOC FU Call Status:: Unsuccessful Call (2nd Attempt) Unsuccessful Call (2nd Attempt) Date: 04/13/24  Attempted to reach the patient regarding the most recent Inpatient/ED visit.  Follow Up Plan: Additional outreach attempts will be made to reach the patient to complete the Transitions of Care (Post Inpatient/ED visit) call.   Cecilie Coffee [facility], BSN RN Care Manager/ Transition of Care [place]/ [facility] [phone]

## 2024-04-14 ENCOUNTER — Telehealth: Payer: Self-pay | Admitting: *Deleted

## 2024-04-14 ENCOUNTER — Other Ambulatory Visit (HOSPITAL_COMMUNITY): Payer: Self-pay

## 2024-04-14 NOTE — Transitions of Care (Post Inpatient/ED Visit) (Signed)
   04/14/2024  Name: MARTYN BLANDIN MRN: [id number] DOB: 1966-02-17  Today's TOC FU Call Status: Today's TOC FU Call Status:: Unsuccessful Call (3rd Attempt) Unsuccessful Call (3rd Attempt) Date: 04/14/24  Attempted to reach the patient regarding the most recent Inpatient/ED visit.  Follow Up Plan: No further outreach attempts will be made at this time. We have been unable to contact the patient.  Cecilie Coffee [facility], BSN RN Care Manager/ Transition of Care [place]/ [facility] [phone]

## 2024-04-19 ENCOUNTER — Other Ambulatory Visit (HOSPITAL_COMMUNITY): Payer: Self-pay | Admitting: Nurse Practitioner

## 2024-04-19 DIAGNOSIS — R188 Other ascites: Secondary | ICD-10-CM

## 2024-04-29 ENCOUNTER — Encounter (HOSPITAL_COMMUNITY): Payer: Self-pay

## 2024-04-29 ENCOUNTER — Inpatient Hospital Stay (HOSPITAL_COMMUNITY)
Admission: RE | Admit: 2024-04-29 | Discharge: 2024-04-29 | Disposition: A | Discharge status: Home / Self Care | Source: Ambulatory Visit | Attending: Nurse Practitioner | Admitting: Nurse Practitioner

## 2024-05-17 ENCOUNTER — Other Ambulatory Visit: Payer: Self-pay

## 2024-05-18 ENCOUNTER — Ambulatory Visit (INDEPENDENT_AMBULATORY_CARE_PROVIDER_SITE_OTHER): Payer: Self-pay | Admitting: Mental Health

## 2024-05-18 DIAGNOSIS — F332 Major depressive disorder, recurrent severe without psychotic features: Secondary | ICD-10-CM

## 2024-05-18 DIAGNOSIS — F411 Generalized anxiety disorder: Secondary | ICD-10-CM

## 2024-05-18 DIAGNOSIS — F4310 Post-traumatic stress disorder, unspecified: Secondary | ICD-10-CM

## 2024-05-18 DIAGNOSIS — Z720 Tobacco use: Secondary | ICD-10-CM
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
## 2024-05-19 ENCOUNTER — Other Ambulatory Visit (HOSPITAL_COMMUNITY): Payer: Self-pay

## 2024-05-19 ENCOUNTER — Other Ambulatory Visit: Payer: Self-pay

## 2024-05-20 ENCOUNTER — Ambulatory Visit: Admitting: Gastroenterology

## 2024-05-22 ENCOUNTER — Other Ambulatory Visit: Payer: Self-pay

## 2024-05-22 ENCOUNTER — Emergency Department (HOSPITAL_COMMUNITY)

## 2024-05-22 ENCOUNTER — Observation Stay (HOSPITAL_COMMUNITY)
Admission: EM | Admit: 2024-05-22 | Discharge: 2024-05-25 | Disposition: A | Discharge status: Home / Self Care | Attending: Emergency Medicine | Admitting: Emergency Medicine

## 2024-05-22 ENCOUNTER — Encounter (HOSPITAL_COMMUNITY): Payer: Self-pay | Admitting: *Deleted

## 2024-05-22 DIAGNOSIS — F4310 Post-traumatic stress disorder, unspecified: Secondary | ICD-10-CM | POA: Diagnosis not present

## 2024-05-22 DIAGNOSIS — F1290 Cannabis use, unspecified, uncomplicated: Secondary | ICD-10-CM | POA: Diagnosis not present

## 2024-05-22 DIAGNOSIS — N179 Acute kidney failure, unspecified: Secondary | ICD-10-CM | POA: Diagnosis not present

## 2024-05-22 DIAGNOSIS — K625 Hemorrhage of anus and rectum: Principal | ICD-10-CM | POA: Insufficient documentation

## 2024-05-22 DIAGNOSIS — F411 Generalized anxiety disorder: Secondary | ICD-10-CM | POA: Insufficient documentation

## 2024-05-22 DIAGNOSIS — R569 Unspecified convulsions: Secondary | ICD-10-CM

## 2024-05-22 DIAGNOSIS — F332 Major depressive disorder, recurrent severe without psychotic features: Secondary | ICD-10-CM | POA: Insufficient documentation

## 2024-05-22 DIAGNOSIS — K4090 Unilateral inguinal hernia, without obstruction or gangrene, not specified as recurrent: Secondary | ICD-10-CM | POA: Diagnosis not present

## 2024-05-22 DIAGNOSIS — Z79899 Other long term (current) drug therapy: Secondary | ICD-10-CM | POA: Insufficient documentation

## 2024-05-22 DIAGNOSIS — R188 Other ascites: Secondary | ICD-10-CM

## 2024-05-22 DIAGNOSIS — R601 Generalized edema: Secondary | ICD-10-CM | POA: Diagnosis not present

## 2024-05-22 DIAGNOSIS — K7460 Unspecified cirrhosis of liver: Secondary | ICD-10-CM | POA: Diagnosis not present

## 2024-05-22 DIAGNOSIS — I959 Hypotension, unspecified: Secondary | ICD-10-CM

## 2024-05-22 DIAGNOSIS — R109 Unspecified abdominal pain: Secondary | ICD-10-CM | POA: Diagnosis present

## 2024-05-22 DIAGNOSIS — F419 Anxiety disorder, unspecified: Secondary | ICD-10-CM | POA: Diagnosis present

## 2024-05-22 DIAGNOSIS — K3189 Other diseases of stomach and duodenum: Secondary | ICD-10-CM | POA: Insufficient documentation

## 2024-05-22 DIAGNOSIS — R197 Diarrhea, unspecified: Secondary | ICD-10-CM

## 2024-05-22 LAB — CBC
HCT: 28.4 % — ABNORMAL LOW (ref 39.0–52.0)
Hemoglobin: 9.9 g/dL — ABNORMAL LOW (ref 13.0–17.0)
MCH: 39 pg — ABNORMAL HIGH (ref 26.0–34.0)
MCHC: 34.9 g/dL (ref 30.0–36.0)
MCV: 111.8 fL — ABNORMAL HIGH (ref 80.0–100.0)
Platelets: 53 10*3/uL — ABNORMAL LOW (ref 150–400)
RBC: 2.54 MIL/uL — ABNORMAL LOW (ref 4.22–5.81)
RDW: 17.1 % — ABNORMAL HIGH (ref 11.5–15.5)
WBC: 7.3 10*3/uL (ref 4.0–10.5)
nRBC: 0 % (ref 0.0–0.2)

## 2024-05-22 LAB — CBC WITH DIFFERENTIAL/PLATELET
Abs Immature Granulocytes: 0.04 10*3/uL (ref 0.00–0.07)
Basophils Absolute: 0.1 10*3/uL (ref 0.0–0.1)
Basophils Relative: 1 %
Eosinophils Absolute: 0.4 10*3/uL (ref 0.0–0.5)
Eosinophils Relative: 5 %
HCT: 28.1 % — ABNORMAL LOW (ref 39.0–52.0)
Hemoglobin: 9.8 g/dL — ABNORMAL LOW (ref 13.0–17.0)
Immature Granulocytes: 1 %
Lymphocytes Relative: 27 %
Lymphs Abs: 2.1 10*3/uL (ref 0.7–4.0)
MCH: 38.4 pg — ABNORMAL HIGH (ref 26.0–34.0)
MCHC: 34.9 g/dL (ref 30.0–36.0)
MCV: 110.2 fL — ABNORMAL HIGH (ref 80.0–100.0)
Monocytes Absolute: 1.4 10*3/uL — ABNORMAL HIGH (ref 0.1–1.0)
Monocytes Relative: 18 %
Neutro Abs: 3.8 10*3/uL (ref 1.7–7.7)
Neutrophils Relative %: 48 %
Platelets: 55 10*3/uL — ABNORMAL LOW (ref 150–400)
RBC: 2.55 MIL/uL — ABNORMAL LOW (ref 4.22–5.81)
RDW: 17 % — ABNORMAL HIGH (ref 11.5–15.5)
Smear Review: NORMAL
WBC: 7.8 10*3/uL (ref 4.0–10.5)
nRBC: 0 % (ref 0.0–0.2)

## 2024-05-22 LAB — COMPREHENSIVE METABOLIC PANEL WITH GFR
ALT: 30 U/L (ref 0–44)
AST: 80 U/L — ABNORMAL HIGH (ref 15–41)
Albumin: 2.1 g/dL — ABNORMAL LOW (ref 3.5–5.0)
Alkaline Phosphatase: 250 U/L — ABNORMAL HIGH (ref 38–126)
Anion gap: 8 (ref 5–15)
BUN: 13 mg/dL (ref 6–20)
CO2: 25 mmol/L (ref 22–32)
Calcium: 8.6 mg/dL — ABNORMAL LOW (ref 8.9–10.3)
Chloride: 102 mmol/L (ref 98–111)
Creatinine, Ser: 1.18 mg/dL (ref 0.61–1.24)
GFR, Estimated: >60 mL/min (ref >60)
Glucose, Bld: 93 mg/dL (ref 70–99)
Potassium: 3.6 mmol/L (ref 3.5–5.1)
Sodium: 135 mmol/L (ref 135–145)
Total Bilirubin: 10.2 mg/dL — ABNORMAL HIGH (ref 0.0–1.2)
Total Protein: 7.4 g/dL (ref 6.5–8.1)

## 2024-05-22 LAB — URINALYSIS, ROUTINE W REFLEX MICROSCOPIC
Bilirubin Urine: NEGATIVE
Glucose, UA: NEGATIVE mg/dL
Hgb urine dipstick: NEGATIVE
Ketones, ur: NEGATIVE mg/dL
Leukocytes,Ua: NEGATIVE
Nitrite: NEGATIVE
Protein, ur: NEGATIVE mg/dL
Specific Gravity, Urine: 1.044 — ABNORMAL HIGH (ref 1.005–1.030)
pH: 5 (ref 5.0–8.0)

## 2024-05-22 LAB — PROTIME-INR
INR: 2.7 — ABNORMAL HIGH (ref 0.8–1.2)
Prothrombin Time: 28.8 s — ABNORMAL HIGH (ref 11.4–15.2)

## 2024-05-22 LAB — LIPASE, BLOOD: Lipase: 67 U/L — ABNORMAL HIGH (ref 11–51)

## 2024-05-22 MED ORDER — MORPHINE SULFATE (PF) 2 MG/ML IV SOLN
2.0000 mg | INTRAVENOUS | Status: DC | PRN
  Administered 2024-05-23 – 2024-05-25 (×4): 2 mg via INTRAVENOUS
  Filled 2024-05-22 (×5): qty 1

## 2024-05-22 MED ORDER — PAROXETINE HCL 10 MG PO TABS
10.0000 mg | ORAL_TABLET | Freq: Every day | ORAL | Status: DC
  Administered 2024-05-23 – 2024-05-25 (×3): 10 mg via ORAL
  Filled 2024-05-22 (×4): qty 1

## 2024-05-22 MED ORDER — PANTOPRAZOLE SODIUM 40 MG IV SOLR
40.0000 mg | Freq: Two times a day (BID) | INTRAVENOUS | Status: DC
  Administered 2024-05-22 – 2024-05-23 (×2): 40 mg via INTRAVENOUS
  Filled 2024-05-22 (×2): qty 10

## 2024-05-22 MED ORDER — IOHEXOL 350 MG/ML SOLN
75.0000 mL | Freq: Once | INTRAVENOUS | Status: AC | PRN
  Administered 2024-05-22: 75 mL via INTRAVENOUS

## 2024-05-22 MED ORDER — FOLIC ACID 1 MG PO TABS
1.0000 mg | ORAL_TABLET | Freq: Every day | ORAL | Status: DC
  Administered 2024-05-23 – 2024-05-25 (×3): 1 mg via ORAL
  Filled 2024-05-22 (×4): qty 1

## 2024-05-22 MED ORDER — ONDANSETRON HCL 4 MG/2ML IJ SOLN
4.0000 mg | Freq: Four times a day (QID) | INTRAMUSCULAR | Status: AC | PRN
Start: 2024-05-22 — End: ?

## 2024-05-22 MED ORDER — THIAMINE MONONITRATE 100 MG PO TABS
100.0000 mg | ORAL_TABLET | Freq: Every day | ORAL | Status: DC
  Administered 2024-05-23 – 2024-05-25 (×3): 100 mg via ORAL
  Filled 2024-05-22 (×6): qty 1

## 2024-05-22 MED ORDER — FENTANYL CITRATE PF 50 MCG/ML IJ SOSY
50.0000 ug | PREFILLED_SYRINGE | Freq: Once | INTRAMUSCULAR | Status: AC
  Administered 2024-05-22: 50 ug via INTRAVENOUS
  Filled 2024-05-22: qty 1

## 2024-05-22 MED ORDER — FUROSEMIDE 40 MG PO TABS
80.0000 mg | ORAL_TABLET | Freq: Every day | ORAL | Status: DC
  Administered 2024-05-23: 80 mg via ORAL
  Filled 2024-05-22 (×2): qty 2

## 2024-05-22 MED ORDER — ONDANSETRON HCL 4 MG PO TABS: 4.0000 mg | ORAL_TABLET | Freq: Four times a day (QID) | ORAL | Status: DC | PRN

## 2024-05-22 MED ORDER — QUETIAPINE FUMARATE 25 MG PO TABS: 25.0000 mg | ORAL_TABLET | Freq: Every evening | ORAL | Status: DC | PRN

## 2024-05-22 NOTE — ED Notes (Signed)
 EDP at [facility]

## 2024-05-22 NOTE — ED Notes (Signed)
 2 Chad notified that patient is being transported to unit by EMT .

## 2024-05-22 NOTE — ED Notes (Signed)
 Patient still unable to supply urine sample at this time

## 2024-05-22 NOTE — H&P (Signed)
 History and Physical    Patient: Nathaniel Warren ZOX:[id number] DOB: Dec 28, 1965 DOA: 05/22/2024 DOS: the patient was seen and examined on 05/22/2024 PCP: Jerrlyn Morel, NP  Patient coming from: {Point_of_Origin:[address]}  Chief Complaint:  Chief Complaint  Patient presents with   Abdominal Pain   HPI: Nathaniel Warren is a 58 y.o. male with medical history significant of ***  Review of Systems: {ROS_Text:26778} Past Medical History:  Diagnosis Date   Cirrhosis (HCC)    Depression    Drug overdose 10/20/2017   Drug overdose, intentional self-harm, initial encounter (HCC) 10/20/2017   Drug overdose, intentional, initial encounter (HCC) 10/20/2017   Hernia, abdominal    Liver failure (HCC)    Seizures (HCC)    Stroke (HCC)    Tobacco abuse    Past Surgical History:  Procedure Laterality Date   ANKLE SURGERY     ANKLE SURGERY Right    ESOPHAGOGASTRODUODENOSCOPY Left 03/20/2024   Procedure: EGD (ESOPHAGOGASTRODUODENOSCOPY);  Surgeon: Annis Kinder, DO;  Location: [facility];  Service: Gastroenterology;  Laterality: Left;   HERNIA REPAIR     INCISION AND DRAINAGE OF WOUND Left 02/03/2023   Procedure: IRRIGATION AND DEBRIDEMENT WOUND LEFT RING FINGER POSSIBLE AMPUTATION;  Surgeon: Ltanya Rummer, MD;  Location: [facility];  Service: Orthopedics;  Laterality: Left;   IR PARACENTESIS  11/12/2023   IR PARACENTESIS  12/01/2023   IR PARACENTESIS  02/29/2024   SPLENECTOMY     Social History:  reports that he has been smoking cigarettes. He has a 30 pack-year smoking history. He has quit using smokeless tobacco. He reports that he does not currently use alcohol . He reports current drug use. Drug: Marijuana.  Allergies  Allergen Reactions   Nsaids Other (See Comments)    Cannot take due to liver condition    Family History  Problem Relation Age of Onset   COPD Mother    Diabetes Father    Stroke Maternal Grandmother    Alzheimer's disease Maternal Grandfather    Cancer Paternal  Grandmother    Stomach cancer Paternal Grandfather    Hypertension Other    Colon cancer Neg Hx    Esophageal cancer Neg Hx    Colon polyps Neg Hx    Inflammatory bowel disease Neg Hx    Liver disease Neg Hx    Pancreatic cancer Neg Hx    Rectal cancer Neg Hx     Prior to Admission medications   Medication Sig Start Date End Date Taking? Authorizing Provider  folic acid  (FOLVITE ) 1 MG tablet Take 1 tablet (1 mg total) by mouth daily. 04/11/24  Yes   furosemide  (LASIX ) 40 MG tablet Take 2 tablets (80 mg total) by mouth daily. 03/25/24  Yes Swayze, Ava, DO  nicotine  (NICOTINE  STEP 2) 14 mg/24hr patch Place 1 patch on the skin daily as needed (for nicotine  craving if patient desires). 01/22/24  Yes   PARoxetine  (PAXIL ) 10 MG tablet Take 10 mg by mouth daily.   Yes [provider]  QUEtiapine  (SEROQUEL ) 25 MG tablet TAKE 1 TABLET BY MOUTH AT BEDTIME NEED  OFFICE  VISIT Patient taking differently: Take 25 mg by mouth at bedtime as needed (racing thoughts). 07/20/23  Yes Jerrlyn Morel, NP  thiamine  (VITAMIN B1) 100 MG tablet Take 1 tablet (100 mg total) by mouth daily. 04/11/24  Yes   aMILoride  (MIDAMOR ) 5 MG tablet Take 2 tablets (10 mg total) by mouth daily. Patient not taking: Reported on 05/22/2024 03/25/24   Swayze, Ava,  DO  pantoprazole  (PROTONIX ) 40 MG tablet Take 1 tablet (40 mg total) by mouth daily. Patient not taking: Reported on 05/22/2024 03/25/24   Swayze, Ava, DO  potassium chloride  SA (KLOR-CON  M) 20 MEQ tablet Take 2 tablets (40 mEq total) by mouth daily for 1 day, THEN 1 tablet (20 mEq total) daily. Patient not taking: Reported on 05/22/2024 04/02/24 05/04/24  Kennedy-Smith, Colleen M, NP  rifaximin  (XIFAXAN ) 550 MG TABS tablet Take 1 tablet (550 mg total) by mouth 2 (two) times daily. Patient not taking: Reported on 05/22/2024 04/11/24     senna (SENOKOT) 8.6 MG tablet Take 2 tablets (17.2 mg total) by mouth every evening. Patient not taking: Reported on 05/22/2024 04/11/24      rivaroxaban  (XARELTO ) 20 MG TABS tablet Take 1 tablet (20 mg total) by mouth daily. 03/07/24 04/11/24      Physical Exam: Vitals:   05/22/24 1755 05/22/24 1805 05/22/24 1900 05/22/24 1907  BP:  (!) 110/59 109/64   Pulse: 65 61 61   Resp: 19 12    Temp:  98 F (36.7 C)  97.8 F (36.6 C)  TempSrc:  Oral  Oral  SpO2: 100% 100% 100%   Weight:      Height:       *** Data Reviewed: {Tip this will not be part of the note when signed- Document your independent interpretation of telemetry tracing, EKG, lab, Radiology test or any other diagnostic tests. Add any new diagnostic test ordered today. (Optional):26781} {Results:26384}  Assessment and Plan: No notes have been filed under this hospital service. Service: Hospitalist     Advance Care Planning:   Code Status: Prior ***  Consults: ***  Family Communication: ***  Severity of Illness: {Observation/Inpatient:21159}  AuthorCarolin Chyle, MD 05/22/2024 7:51 PM  For on call review [email or web address].

## 2024-05-22 NOTE — ED Provider Notes (Addendum)
 [place] EMERGENCY DEPARTMENT AT [facility] Provider Note   CSN: [id number] Arrival date & time: 05/22/24  1415     Patient presents with: Abdominal Pain   Nathaniel Warren is a 58 y.o. male.   Patient here with abdominal pain, mostly pain at his left inguinal hernia site.  He has been having new onset of diarrhea again the last couple days.  He had some blood in his stool today.  He has not vomited.  He has not thrown up any blood.  He has history of cirrhosis he follows with transplant team.  He is been sober for couple months.  He denies any chest pain fever chills.  He feels some distention in his belly states he is likely due for paracentesis soon but he is mostly having pain in his hernia site in his left groin.  He had similar diarrhea and bloody stool a few months ago that he was admitted for.  History of seizures history of depression.  The history is provided by the patient.       Prior to Admission medications   Medication Sig Start Date End Date Taking? Authorizing Provider  aMILoride  (MIDAMOR ) 5 MG tablet Take 2 tablets (10 mg total) by mouth daily. 03/25/24  Yes Swayze, Ava, DO  folic acid  (FOLVITE ) 1 MG tablet Take 1 tablet (1 mg total) by mouth daily. 04/11/24  Yes   furosemide  (LASIX ) 40 MG tablet Take 2 tablets (80 mg total) by mouth daily. 03/25/24  Yes Swayze, Ava, DO  polyethylene glycol (MIRALAX  / GLYCOLAX ) 17 g packet Take 17 g by mouth 2 (two) times daily for 14 days 04/11/24  Yes   thiamine  (VITAMIN B1) 100 MG tablet Take 1 tablet (100 mg total) by mouth daily. 04/11/24  Yes   nicotine  (NICOTINE  STEP 2) 14 mg/24hr patch Place 1 patch on the skin daily as needed (for nicotine  craving if patient desires). 01/22/24     pantoprazole  (PROTONIX ) 40 MG tablet Take 1 tablet (40 mg total) by mouth daily. 03/25/24   Swayze, Ava, DO  potassium chloride  SA (KLOR-CON  M) 20 MEQ tablet Take 2 tablets (40 mEq total) by mouth daily for 1 day, THEN 1 tablet (20 mEq total)  daily. 04/02/24 05/04/24  Kennedy-Smith, Colleen M, NP  QUEtiapine  (SEROQUEL ) 25 MG tablet TAKE 1 TABLET BY MOUTH AT BEDTIME NEED  OFFICE  VISIT 07/20/23   Jerrlyn Morel, NP  rifaximin  (XIFAXAN ) 550 MG TABS tablet Take 1 tablet (550 mg total) by mouth 2 (two) times daily. 04/11/24     senna (SENOKOT) 8.6 MG tablet Take 2 tablets (17.2 mg total) by mouth every evening. 04/11/24     rivaroxaban  (XARELTO ) 20 MG TABS tablet Take 1 tablet (20 mg total) by mouth daily. 03/07/24 04/11/24      Allergies: Nsaids    Review of Systems  Updated Vital Signs BP (!) 110/59 (BP Location: Right Arm)   Pulse 61   Temp 98 F (36.7 C) (Oral)   Resp 12   Ht 5' 9 (1.753 m)   Wt 93 kg   SpO2 100%   BMI 30.27 kg/m   Physical Exam Vitals and nursing note reviewed.  Constitutional:      General: He is not in acute distress.    Appearance: He is well-developed. He is not ill-appearing.  HENT:     Head: Normocephalic and atraumatic.     Mouth/Throat:     Mouth: Mucous membranes are moist.   Eyes:  Extraocular Movements: Extraocular movements intact.     Conjunctiva/sclera: Conjunctivae normal.     Pupils: Pupils are equal, round, and reactive to light.    Cardiovascular:     Rate and Rhythm: Normal rate and regular rhythm.     Heart sounds: Normal heart sounds. No murmur heard. Pulmonary:     Effort: Pulmonary effort is normal. No respiratory distress.     Breath sounds: Normal breath sounds.  Abdominal:     General: There is distension.     Palpations: Abdomen is soft.     Tenderness: There is no abdominal tenderness.     Hernia: A hernia is present.     Comments: Patient had easily reducible left inguinal hernia, he was tender in this area but hernia was easily reduced   Musculoskeletal:        General: No swelling.     Cervical back: Neck supple.   Skin:    General: Skin is warm and dry.     Capillary Refill: Capillary refill takes less than 2 seconds.   Neurological:     Mental  Status: He is alert.   Psychiatric:        Mood and Affect: Mood normal.     (all labs ordered are listed, but only abnormal results are displayed) Labs Reviewed  CBC WITH DIFFERENTIAL/PLATELET - Abnormal; Notable for the following components:      Result Value   RBC 2.55 (*)    Hemoglobin 9.8 (*)    HCT 28.1 (*)    MCV 110.2 (*)    MCH 38.4 (*)    RDW 17.0 (*)    Platelets 55 (*)    Monocytes Absolute 1.4 (*)    All other components within normal limits  COMPREHENSIVE METABOLIC PANEL WITH GFR - Abnormal; Notable for the following components:   Calcium 8.6 (*)    Albumin  2.1 (*)    AST 80 (*)    Alkaline Phosphatase 250 (*)    Total Bilirubin 10.2 (*)    All other components within normal limits  LIPASE, BLOOD - Abnormal; Notable for the following components:   Lipase 67 (*)    All other components within normal limits  PROTIME-INR - Abnormal; Notable for the following components:   Prothrombin Time 28.8 (*)    INR 2.7 (*)    All other components within normal limits  URINALYSIS, ROUTINE W REFLEX MICROSCOPIC    EKG: None  Radiology: CT ABDOMEN PELVIS W CONTRAST Result Date: 05/22/2024 CLINICAL DATA:  Abdominal pain, acute, nonlocalized. EXAM: CT ABDOMEN AND PELVIS WITH CONTRAST TECHNIQUE: Multidetector CT imaging of the abdomen and pelvis was performed using the standard protocol following bolus administration of intravenous contrast. RADIATION DOSE REDUCTION: This exam was performed according to the departmental dose-optimization program which includes automated exposure control, adjustment of the mA and/or kV according to patient size and/or use of iterative reconstruction technique. CONTRAST:  75mL OMNIPAQUE  IOHEXOL  350 MG/ML SOLN COMPARISON:  03/18/2024. FINDINGS: Lower chest: The heart is enlarged. There is a small pleural effusion on the right and a trace pleural effusion on the left with associated atelectasis at the lung bases. Hepatobiliary: Liver is small with a  nodular contour compatible with underlying cirrhosis. No focal abnormality is seen. Stones are present within the gallbladder. No biliary ductal dilatation is seen. Pancreas: Unremarkable. No pancreatic ductal dilatation or surrounding inflammatory changes. Spleen: Left upper quadrant splenosis is unchanged. Adrenals/Urinary Tract: The adrenal glands are within normal limits. The kidneys enhance symmetrically.  No renal calculus or hydronephrosis is seen. The bladder is unremarkable. Stomach/Bowel: Stomach is within normal limits. Appendix appears normal. No evidence of bowel wall thickening, distention, or inflammatory changes. No free air or pneumatosis. Scattered diverticular present along the colon without evidence of diverticulitis. Vascular/Lymphatic: Aortic atherosclerosis. 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 No abdominal or pelvic lymphadenopathy. Reproductive: Prostate is unremarkable. Other: Large ascites with extension of fluid into anterior abdominal wall hernias and left inguinal hernia. Anasarca is noted. Musculoskeletal: Degenerative changes are present in the thoracolumbar spine. No acute osseous abnormality is seen. IMPRESSION: 1. Large ascites and anasarca. 2. 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 3. Cholelithiasis. 4. Small left pleural effusion and trace right pleural effusion with associated atelectasis. 5. Diverticulosis without diverticulitis. 6. Aortic atherosclerosis. Electronically Signed   By: Wyvonnia Heimlich M.D.   On: 05/22/2024 17:25     Procedures   Medications Ordered in the ED  fentaNYL  (SUBLIMAZE ) injection 50 mcg (50 mcg Intravenous Given 05/22/24 1616)  iohexol  (OMNIPAQUE ) 350 MG/ML injection 75 mL (75 mLs Intravenous Contrast Given 05/22/24 1646)                                     Medical Decision Making Amount and/or Complexity of Data Reviewed Labs: ordered. Radiology: ordered.  Risk Prescription drug management. Decision regarding hospitalization.   Nathaniel Warren is here with pain in his left groin, abdominal distention, diarrhea and blood in his stool.  History of cirrhosis liver failure on transplant list.  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  Overall he has a left inguinal hernia that is tender on exam but this was easily reducible.  He endorses bright red bloody stool today.  He is had diarrhea the last couple days but this was the first stool that was bloody.  He has not had any vomiting.  He has not thrown up any blood.  He has similar episode a few months ago.  He states he is likely due for paracentesis soon.  He is having some distention.  Pain somewhat better after hernia has been reduced.  Having diarrhea several times a day here recently.  Per chart review looks like he had enterocolitis recently and was admitted for that.  He has no fever.  He is well-appearing otherwise.  He is jaundiced.  Follows with transplant team.  Overall we will check labs INR CT scan abdomen pelvis.  Too late in the day to pursue paracentesis but he looks comfortable from that standpoint, he does not need an emergent paracentesis at this time.  Ultimately we will check his blood count and see if there is another colitis.  Hemoglobin is 9.8.  He has not had any other bloody bowel movements here.  White count 7.8.  INR is 2.7.  Bilirubin is 10.  Alk phos 250.  CT scan shows ascites but does not show any bowel obstruction or colitis or diverticulitis.  Bowel overall looks healthy.  He has not had any other bloody bowel movements here.  He is hemodynamically stable.  Will talk with gastroenterology if they want to keep him here overnight to trend his hemoglobin trend to see if he has any other bloody bowel movements.  He has  not had any emesis.  I talked  with Dr. Nickey Barn with gastroenterology.  He does recommend admission for observation overnight to see if he has any further bleeding to trend his hemoglobin.  Can likely get paracentesis in the a.m. as well.  At this time he is hemodynamically stable.  I do not have any concern for major infectious process.  GI to evaluate in the morning.  Will admit to medicine.  Talked with Dr. Marne Sings with IR will do paracentesis tomorrow.  Did like INR to be as close to 2 as possible.  This chart was dictated using voice recognition software.  Despite best efforts to proofread,  errors can occur which can change the documentation meaning.      Final diagnoses:  Rectal bleeding  Diarrhea, unspecified type  Other ascites    ED Discharge Orders     None          Lowery Rue, DO 05/22/24 1755    Lowery Rue, DO 05/22/24 1814

## 2024-05-22 NOTE — ED Triage Notes (Signed)
 BIB [facility] from home for recurrent abd pain and diarrhea. Diarrhea onset Thursday. Abd pain onset Friday. Receives frequent IR paracentesis, last was ~ 2 months ago. Jaundice. Abd swollen/ distended, and TTP. Describes as felt tearing sensation on the LLQ to L groin. Endorses pain, swelling and sob. Alert, NAD, calm, interactive. H/o LLQ/ inguinal hernia. States, have been trying to get on liver transplant list. LS CTA. Mentions possible bloody stool.

## 2024-05-22 NOTE — ED Notes (Signed)
 Med rec tech at [facility]. Pt alert, NAD, calm, interactive, resps e/u, speaking clearly.

## 2024-05-22 NOTE — Consult Note (Signed)
 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  He reports that he had some abdominal pain on Friday.  There was a tearing sensation that moved from the left lower midline to his left inguinal region.  The pain was rather severe, but he managed with his symptoms.  On Saturday he reports that his symptoms worsened and he had several diarrheal fecal accidents.  Today, he had an episode of hematochezia, and this prompted him to present to the ER.  In the ER he was confirmed to have a reducible left inguinal hernia.  His blood work was overall stable and there was no significant drop in his HGB with the isolated hematochezia.  The colonoscopy in 07/2023 was negative for rectal varices.  Currently the patient is being evaluated for hepatic transplantation.  Past Medical History:  Diagnosis Date   Cirrhosis (HCC)    Depression    Drug overdose 10/20/2017   Drug overdose, intentional self-harm, initial encounter (HCC) 10/20/2017   Drug overdose, intentional, initial encounter (HCC) 10/20/2017   Hernia, abdominal    Liver failure (HCC)    Seizures (HCC)    Stroke (HCC)    Tobacco abuse     Past Surgical History:  Procedure Laterality Date   ANKLE SURGERY     ANKLE SURGERY Right    ESOPHAGOGASTRODUODENOSCOPY Left 03/20/2024   Procedure: EGD (ESOPHAGOGASTRODUODENOSCOPY);  Surgeon: Annis Kinder, DO;  Location: [facility];  Service: Gastroenterology;  Laterality: Left;   HERNIA REPAIR     INCISION AND DRAINAGE OF WOUND Left 02/03/2023   Procedure: IRRIGATION AND DEBRIDEMENT WOUND LEFT RING FINGER POSSIBLE AMPUTATION;  Surgeon: Ltanya Rummer, MD;  Location: [facility];  Service:  Orthopedics;  Laterality: Left;   IR PARACENTESIS  11/12/2023   IR PARACENTESIS  12/01/2023   IR PARACENTESIS  02/29/2024   SPLENECTOMY      Family History  Problem Relation Age of Onset   COPD Mother    Diabetes Father    Stroke Maternal Grandmother    Alzheimer's disease Maternal Grandfather    Cancer Paternal Grandmother    Stomach cancer Paternal Grandfather    Hypertension Other    Colon cancer Neg Hx    Esophageal cancer Neg Hx    Colon polyps Neg Hx    Inflammatory bowel disease Neg Hx    Liver disease Neg Hx    Pancreatic cancer Neg Hx    Rectal cancer Neg Hx     Social History:  reports that he has been smoking cigarettes. He has a 30 pack-year smoking history. He has quit using smokeless tobacco. He reports that he does not currently use alcohol . He reports current drug use. Drug: Marijuana.  Allergies:  Allergies  Allergen Reactions   Nsaids Other (See Comments)    Cannot take due to liver condition    Medications: Scheduled: Continuous:  Results for orders placed or performed during the hospital encounter of 05/22/24 (from the past 24 hours)  CBC with Differential     Status: Abnormal   Collection Time: 05/22/24  3:12 PM  Result Value Ref Range   WBC 7.8 4.0 - 10.5 K/uL   RBC 2.55 (L) 4.22 - 5.81 MIL/uL  Hemoglobin 9.8 (L) 13.0 - 17.0 g/dL   HCT 16.1 (L) 09.6 - 04.5 %   MCV 110.2 (H) 80.0 - 100.0 fL   MCH 38.4 (H) 26.0 - 34.0 pg   MCHC 34.9 30.0 - 36.0 g/dL   RDW 40.9 (H) 81.1 - 91.4 %   Platelets 55 (L) 150 - 400 K/uL   nRBC 0.0 0.0 - 0.2 %   Neutrophils Relative % 48 %   Neutro Abs 3.8 1.7 - 7.7 K/uL   Lymphocytes Relative 27 %   Lymphs Abs 2.1 0.7 - 4.0 K/uL   Monocytes Relative 18 %   Monocytes Absolute 1.4 (H) 0.1 - 1.0 K/uL   Eosinophils Relative 5 %   Eosinophils Absolute 0.4 0.0 - 0.5 K/uL   Basophils Relative 1 %   Basophils Absolute 0.1 0.0 - 0.1 K/uL   WBC Morphology MORPHOLOGY UNREMARKABLE    RBC Morphology MORPHOLOGY UNREMARKABLE     Smear Review Normal platelet morphology    Immature Granulocytes 1 %   Abs Immature Granulocytes 0.04 0.00 - 0.07 K/uL  Comprehensive metabolic panel     Status: Abnormal   Collection Time: 05/22/24  3:12 PM  Result Value Ref Range   Sodium 135 135 - 145 mmol/L   Potassium 3.6 3.5 - 5.1 mmol/L   Chloride 102 98 - 111 mmol/L   CO2 25 22 - 32 mmol/L   Glucose, Bld 93 70 - 99 mg/dL   BUN 13 6 - 20 mg/dL   Creatinine, Ser 7.82 0.61 - 1.24 mg/dL   Calcium 8.6 (L) 8.9 - 10.3 mg/dL   Total Protein 7.4 6.5 - 8.1 g/dL   Albumin  2.1 (L) 3.5 - 5.0 g/dL   AST 80 (H) 15 - 41 U/L   ALT 30 0 - 44 U/L   Alkaline Phosphatase 250 (H) 38 - 126 U/L   Total Bilirubin 10.2 (H) 0.0 - 1.2 mg/dL   GFR, Estimated >95 >62 mL/min   Anion gap 8 5 - 15  Lipase, blood     Status: Abnormal   Collection Time: 05/22/24  3:12 PM  Result Value Ref Range   Lipase 67 (H) 11 - 51 U/L  Protime-INR     Status: Abnormal   Collection Time: 05/22/24  4:16 PM  Result Value Ref Range   Prothrombin Time 28.8 (H) 11.4 - 15.2 seconds   INR 2.7 (H) 0.8 - 1.2     CT ABDOMEN PELVIS W CONTRAST Result Date: 05/22/2024 CLINICAL DATA:  Abdominal pain, acute, nonlocalized. EXAM: CT ABDOMEN AND PELVIS WITH CONTRAST TECHNIQUE: Multidetector CT imaging of the abdomen and pelvis was performed using the standard protocol following bolus administration of intravenous contrast. RADIATION DOSE REDUCTION: This exam was performed according to the departmental dose-optimization program which includes automated exposure control, adjustment of the mA and/or kV according to patient size and/or use of iterative reconstruction technique. CONTRAST:  75mL OMNIPAQUE  IOHEXOL  350 MG/ML SOLN COMPARISON:  03/18/2024. FINDINGS: Lower chest: The heart is enlarged. There is a small pleural effusion on the right and a trace pleural effusion on the left with associated atelectasis at the lung bases. Hepatobiliary: Liver is small with a nodular contour compatible  with underlying cirrhosis. No focal abnormality is seen. Stones are present within the gallbladder. No biliary ductal dilatation is seen. Pancreas: Unremarkable. No pancreatic ductal dilatation or surrounding inflammatory changes. Spleen: Left upper quadrant splenosis is unchanged. Adrenals/Urinary Tract: The adrenal glands are within normal limits. The kidneys enhance symmetrically. No renal calculus  or hydronephrosis is seen. The bladder is unremarkable. Stomach/Bowel: Stomach is within normal limits. Appendix appears normal. No evidence of bowel wall thickening, distention, or inflammatory changes. No free air or pneumatosis. Scattered diverticular present along the colon without evidence of diverticulitis. Vascular/Lymphatic: Aortic atherosclerosis. 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 No abdominal or pelvic lymphadenopathy. Reproductive: Prostate is unremarkable. Other: Large ascites with extension of fluid into anterior abdominal wall hernias and left inguinal hernia. Anasarca is noted. Musculoskeletal: Degenerative changes are present in the thoracolumbar spine. No acute osseous abnormality is seen. IMPRESSION: 1. Large ascites and anasarca. 2. 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 3. Cholelithiasis. 4. Small left pleural effusion and trace right pleural effusion with associated atelectasis. 5. Diverticulosis without diverticulitis. 6. Aortic atherosclerosis. Electronically Signed   By: Wyvonnia Heimlich M.D.   On: 05/22/2024 17:25    ROS:  As stated above in the HPI otherwise negative.  Blood pressure (!) 110/59, pulse 61, temperature 98 F (36.7 C), temperature source Oral, resp. rate 12, height 5' 9 (1.753 m), weight 93 kg, SpO2 100%.    PE: Gen: NAD, Alert and Oriented, jaundiced HEENT:   /AT, EOMI Neck: Supple, no LAD Lungs: CTA Bilaterally CV: RRR without M/G/R ABD: Soft, NT, distended, reducible left inguinal hernia, +BS, midline incision scar Ext: No C/C/E  Assessment/Plan: 1) Decompensated cirrhosis. 2) Ascites requiring semi-frequent paracentesis. 3) New left inguinal hernia. 4) Hematochezia.   The patient is stable, but it appears that he needs to have routine paracenteses.  The bleeding was an isolated event and he is hemodynamically stable.    Plan: 1) Agree with paracentesis tomorrow. 2) Monitor HGB. 3) 2 gram sodium diet. 4) [facility] will assume care in the AM.  Makinze Jani D 05/22/2024, 6:51 PM

## 2024-05-23 ENCOUNTER — Observation Stay (HOSPITAL_COMMUNITY)

## 2024-05-23 DIAGNOSIS — I8500 Esophageal varices without bleeding: Secondary | ICD-10-CM

## 2024-05-23 DIAGNOSIS — F1090 Alcohol use, unspecified, uncomplicated: Secondary | ICD-10-CM

## 2024-05-23 DIAGNOSIS — K7031 Alcoholic cirrhosis of liver with ascites: Secondary | ICD-10-CM

## 2024-05-23 DIAGNOSIS — R109 Unspecified abdominal pain: Secondary | ICD-10-CM | POA: Diagnosis not present

## 2024-05-23 DIAGNOSIS — I864 Gastric varices: Secondary | ICD-10-CM

## 2024-05-23 DIAGNOSIS — I81 Portal vein thrombosis: Secondary | ICD-10-CM

## 2024-05-23 DIAGNOSIS — K625 Hemorrhage of anus and rectum: Secondary | ICD-10-CM | POA: Diagnosis not present

## 2024-05-23 HISTORY — PX: IR PARACENTESIS: IMG2679

## 2024-05-23 LAB — CBC
HCT: 24.9 % — ABNORMAL LOW (ref 39.0–52.0)
HCT: 25.3 % — ABNORMAL LOW (ref 39.0–52.0)
HCT: 24.3 % — ABNORMAL LOW (ref 39.0–52.0)
Hemoglobin: 8.5 g/dL — ABNORMAL LOW (ref 13.0–17.0)
Hemoglobin: 8.9 g/dL — ABNORMAL LOW (ref 13.0–17.0)
Hemoglobin: 8.4 g/dL — ABNORMAL LOW (ref 13.0–17.0)
MCH: 37.4 pg — ABNORMAL HIGH (ref 26.0–34.0)
MCH: 38.7 pg — ABNORMAL HIGH (ref 26.0–34.0)
MCH: 38.2 pg — ABNORMAL HIGH (ref 26.0–34.0)
MCHC: 34.1 g/dL (ref 30.0–36.0)
MCHC: 35.2 g/dL (ref 30.0–36.0)
MCHC: 34.6 g/dL (ref 30.0–36.0)
MCV: 109.7 fL — ABNORMAL HIGH (ref 80.0–100.0)
MCV: 110 fL — ABNORMAL HIGH (ref 80.0–100.0)
MCV: 110.5 fL — ABNORMAL HIGH (ref 80.0–100.0)
Platelets: 50 10*3/uL — ABNORMAL LOW (ref 150–400)
Platelets: 52 10*3/uL — ABNORMAL LOW (ref 150–400)
Platelets: 51 10*3/uL — ABNORMAL LOW (ref 150–400)
RBC: 2.27 MIL/uL — ABNORMAL LOW (ref 4.22–5.81)
RBC: 2.3 MIL/uL — ABNORMAL LOW (ref 4.22–5.81)
RBC: 2.2 MIL/uL — ABNORMAL LOW (ref 4.22–5.81)
RDW: 16.9 % — ABNORMAL HIGH (ref 11.5–15.5)
RDW: 17 % — ABNORMAL HIGH (ref 11.5–15.5)
RDW: 17.2 % — ABNORMAL HIGH (ref 11.5–15.5)
WBC: 6.5 10*3/uL (ref 4.0–10.5)
WBC: 6.2 10*3/uL (ref 4.0–10.5)
WBC: 5.8 10*3/uL (ref 4.0–10.5)
nRBC: 0 % (ref 0.0–0.2)
nRBC: 0 % (ref 0.0–0.2)
nRBC: 0 % (ref 0.0–0.2)

## 2024-05-23 LAB — COMPREHENSIVE METABOLIC PANEL WITH GFR
ALT: 25 U/L (ref 0–44)
AST: 65 U/L — ABNORMAL HIGH (ref 15–41)
Albumin: 1.7 g/dL — ABNORMAL LOW (ref 3.5–5.0)
Alkaline Phosphatase: 246 U/L — ABNORMAL HIGH (ref 38–126)
Anion gap: 3 — ABNORMAL LOW (ref 5–15)
BUN: 11 mg/dL (ref 6–20)
CO2: 26 mmol/L (ref 22–32)
Calcium: 8.3 mg/dL — ABNORMAL LOW (ref 8.9–10.3)
Chloride: 106 mmol/L (ref 98–111)
Creatinine, Ser: 1.12 mg/dL (ref 0.61–1.24)
GFR, Estimated: >60 mL/min (ref >60)
Glucose, Bld: 86 mg/dL (ref 70–99)
Potassium: 4.3 mmol/L (ref 3.5–5.1)
Sodium: 135 mmol/L (ref 135–145)
Total Bilirubin: 9.6 mg/dL — ABNORMAL HIGH (ref 0.0–1.2)
Total Protein: 6.4 g/dL — ABNORMAL LOW (ref 6.5–8.1)

## 2024-05-23 MED ORDER — FUROSEMIDE 40 MG PO TABS
40.0000 mg | ORAL_TABLET | Freq: Every day | ORAL | Status: DC
  Administered 2024-05-24 – 2024-05-25 (×2): 40 mg via ORAL
  Filled 2024-05-23 (×2): qty 1

## 2024-05-23 MED ORDER — POTASSIUM CHLORIDE CRYS ER 20 MEQ PO TBCR
20.0000 meq | EXTENDED_RELEASE_TABLET | Freq: Every day | ORAL | Status: DC
  Administered 2024-05-23 – 2024-05-25 (×3): 20 meq via ORAL
  Filled 2024-05-23 (×3): qty 1

## 2024-05-23 MED ORDER — LIDOCAINE HCL 1 % IJ SOLN
INTRAMUSCULAR | Status: AC
  Filled 2024-05-23: qty 20

## 2024-05-23 MED ORDER — OXYCODONE HCL 5 MG PO TABS
5.0000 mg | ORAL_TABLET | Freq: Four times a day (QID) | ORAL | Status: DC | PRN
  Administered 2024-05-23: 10 mg via ORAL
  Filled 2024-05-23: qty 1
  Filled 2024-05-23: qty 2
  Filled 2024-05-23: qty 1

## 2024-05-23 MED ORDER — LIDOCAINE HCL 1 % IJ SOLN
10.0000 mL | Freq: Once | INTRAMUSCULAR | Status: AC
  Administered 2024-05-23: 10 mL

## 2024-05-23 NOTE — Plan of Care (Signed)
  Problem: Education: Goal: Knowledge of General Education information will improve Description: Including pain rating scale, medication(s)/side effects and non-pharmacologic comfort measures 05/23/2024 0513 by Kristy Phenes, RN Outcome: Progressing 05/23/2024 0420 by Kristy Phenes, RN Outcome: Progressing   Problem: Health Behavior/Discharge Planning: Goal: Ability to manage health-related needs will improve 05/23/2024 0513 by Kristy Phenes, RN Outcome: Progressing 05/23/2024 0420 by Kristy Phenes, RN Outcome: Progressing   Problem: Clinical Measurements: Goal: Ability to maintain clinical measurements within normal limits will improve 05/23/2024 0513 by Kristy Phenes, RN Outcome: Progressing 05/23/2024 0420 by Kristy Phenes, RN Outcome: Progressing Goal: Will remain free from infection 05/23/2024 0513 by Kristy Phenes, RN Outcome: Progressing 05/23/2024 0420 by Kristy Phenes, RN Outcome: Progressing Goal: Diagnostic test results will improve 05/23/2024 0513 by Kristy Phenes, RN Outcome: Progressing 05/23/2024 0420 by Kristy Phenes, RN Outcome: Progressing Goal: Respiratory complications will improve 05/23/2024 0513 by Kristy Phenes, RN Outcome: Progressing 05/23/2024 0420 by Kristy Phenes, RN Outcome: Progressing Goal: Cardiovascular complication will be avoided 05/23/2024 0513 by Kristy Phenes, RN Outcome: Progressing 05/23/2024 0420 by Kristy Phenes, RN Outcome: Progressing   Problem: Activity: Goal: Risk for activity intolerance will decrease 05/23/2024 0513 by Kristy Phenes, RN Outcome: Progressing 05/23/2024 0420 by Kristy Phenes, RN Outcome: Progressing   Problem: Nutrition: Goal: Adequate nutrition will be maintained 05/23/2024 0513 by Kristy Phenes, RN Outcome: Progressing 05/23/2024 0420 by Kristy Phenes, RN Outcome: Progressing   Problem:  Coping: Goal: Level of anxiety will decrease 05/23/2024 0513 by Kristy Phenes, RN Outcome: Progressing 05/23/2024 0420 by Kristy Phenes, RN Outcome: Progressing   Problem: Elimination: Goal: Will not experience complications related to bowel motility 05/23/2024 0513 by Kristy Phenes, RN Outcome: Progressing 05/23/2024 0420 by Kristy Phenes, RN Outcome: Progressing Goal: Will not experience complications related to urinary retention 05/23/2024 0513 by Kristy Phenes, RN Outcome: Progressing 05/23/2024 0420 by Kristy Phenes, RN Outcome: Progressing   Problem: Pain Managment: Goal: General experience of comfort will improve and/or be controlled 05/23/2024 0513 by Kristy Phenes, RN Outcome: Progressing 05/23/2024 0420 by Kristy Phenes, RN Outcome: Progressing   Problem: Safety: Goal: Ability to remain free from injury will improve 05/23/2024 0513 by Kristy Phenes, RN Outcome: Progressing 05/23/2024 0420 by Kristy Phenes, RN Outcome: Progressing   Problem: Skin Integrity: Goal: Risk for impaired skin integrity will decrease 05/23/2024 0513 by Kristy Phenes, RN Outcome: Progressing 05/23/2024 0420 by Kristy Phenes, RN Outcome: Progressing

## 2024-05-23 NOTE — Plan of Care (Signed)

## 2024-05-23 NOTE — Procedures (Signed)
 PROCEDURE SUMMARY:  Successful US  guided paracentesis from right lateral abdomen.  Yielded 4.7 liters of clear yellow fluid.  No immediate complications.  Patient tolerated well.  EBL = trace  Specimen not sent for labs.  Kallen Delatorre M Bertie Simien PA-C 05/23/2024 9:00 AM

## 2024-05-23 NOTE — Progress Notes (Signed)
 PROGRESS NOTE  Nathaniel Warren  ZOX:[id number] DOB: Nov 14, 1966 DOA: 05/22/2024 PCP: Jerrlyn Morel, NP  Consultants  Brief Narrative: 58 y.o. male with a PMH significant for liver cirrhosis, history of drug abuse, seizure disorder, tobacco abuse, prior seizures, history of stroke, depression with anxiety who presents to the ER with complaint of rectal bleed.  Patient has known alcoholic cirrhosis with MELD score of 30 with known history also of small esophageal varices from previous EGD about 2 months ago.  He complained of abdominal pain also and distention.  Patient denies active drinking now.  He is hoping to get on a transplant list.  He is so rectal bleed today and got worried so he came to the ER.  This has since resolved.  H&H also is stable.  Due to patient's known history GI consulted.  Recommendation is to admit to medicine for observation and following his H&H.  Patient being admitted to the medical service for that purpose.  Denied any fever or chills denied any nausea vomiting or diarrhea.    Assessment & Plan: #1 Rectal bleeding  - Suspected due to varices.   - No further bleeding since admission. - Hemoglobin has been stable. - Greatly appreciate GI input. - Continue Protonix  and trend hemoglobin  #2 ascites with anasarca:  - Status post therapeutic paracentesis today 6/16. - Patient better afterwards. - Appreciate IR    #3 decompensated alcoholic cirrhosis:  - Elevated MELD score.  Hyperbilirubinemia noted and has chronic. - No hepatic encephalopathy.  GI following.   #4 generalized anxiety disorder:  - Continue home regimen.  Controlled   #5 seizure disorder:  - Will resume home regimen        Pressure Injury 02/09/23 Buttocks Left Stage 2 -  Partial thickness loss of dermis presenting as a shallow open injury with a red, pink wound bed without slough. (Active)  02/09/23 1710  Location: Buttocks  Location Orientation: Left  Staging: Stage 2 -  Partial thickness  loss of dermis presenting as a shallow open injury with a red, pink wound bed without slough.  Wound Description (Comments):   DO NOT USE:  Present on Admission: No   DVT prophylaxis:  SCDs Start: 05/22/24 2024  Code Status:   Code Status: Full Code Level of care: Med-Surg Status is: Observation Dispo: Awaiting results of paracentesis and likely able to discharge home 6/17   Consults called: Gastroenterology  Subjective: Patient without complaints.  Seen status post paracentesis.  Noted improvement in abdominal distention and pain.  Hungry  Objective: Vitals:   05/22/24 2155 05/22/24 2326 05/23/24 0512 05/23/24 0751  BP:  104/60 (!) 98/53 (!) 101/54  Pulse: 62 68 71 69  Resp:  18 18 18   Temp:  98.1 F (36.7 C) 98.2 F (36.8 C) 98.5 F (36.9 C)  TempSrc:    Oral  SpO2:  97% 97% 94%  Weight:      Height:        Intake/Output Summary (Last 24 hours) at 05/23/2024 1453 Last data filed at 05/23/2024 0947 Gross per 24 hour  Intake --  Output 500 ml  Net -500 ml   Filed Weights   05/22/24 1428  Weight: 93 kg   Body mass index is 30.27 kg/m.  Gen: 58 y.o. male in no apparent distress.  Nontoxic.  Diffuse jaundice noticed.  Also scleral icterus Pulm: Non-labored breathing.  Clear to auscultation bilaterally.  CV: Regular rate and rhythm. No murmur, rub, or gallop. No JVD GI:  Abdomen soft and only minimally distended.  Nontender to palpation except for deep palpation right upper quadrant Ext: Warm, no deformities, +2 bilateral pedal edema Skin: No rashes, lesions no ulcers Neuro: Alert and oriented. No focal neurological deficits. Psych: Calm  Judgement and insight appear normal. Mood & affect appropriate.     I have personally reviewed the following labs and images: CBC: Recent Labs  Lab 05/22/24 1512 05/22/24 2140 05/23/24 0340 05/23/24 1022  WBC 7.8 7.3 6.5 6.2  NEUTROABS 3.8  --   --   --   HGB 9.8* 9.9* 8.5* 8.9*  HCT 28.1* 28.4* 24.9* 25.3*  MCV 110.2*  111.8* 109.7* 110.0*  PLT 55* 53* 50* 52*   BMP &GFR Recent Labs  Lab 05/22/24 1512 05/23/24 1022  NA 135 135  K 3.6 4.3  CL 102 106  CO2 25 26  GLUCOSE 93 86  BUN 13 11  CREATININE 1.18 1.12  CALCIUM 8.6* 8.3*   Estimated Creatinine Clearance: 82.9 mL/min (by C-G formula based on SCr of 1.12 mg/dL). Liver & Pancreas: Recent Labs  Lab 05/22/24 1512 05/23/24 1022  AST 80* 65*  ALT 30 25  ALKPHOS 250* 246*  BILITOT 10.2* 9.6*  PROT 7.4 6.4*  ALBUMIN  2.1* 1.7*   Recent Labs  Lab 05/22/24 1512  LIPASE 67*   No results for input(s): AMMONIA in the last 168 hours. Diabetic: No results for input(s): HGBA1C in the last 72 hours. No results for input(s): GLUCAP in the last 168 hours. Cardiac Enzymes: No results for input(s): CKTOTAL, CKMB, CKMBINDEX, TROPONINI in the last 168 hours. No results for input(s): PROBNP in the last 8760 hours. Coagulation Profile: Recent Labs  Lab 05/22/24 1616  INR 2.7*   Thyroid Function Tests: No results for input(s): TSH, T4TOTAL, FREET4, T3FREE, THYROIDAB in the last 72 hours. Lipid Profile: No results for input(s): CHOL, HDL, LDLCALC, TRIG, CHOLHDL, LDLDIRECT in the last 72 hours. Anemia Panel: No results for input(s): VITAMINB12, FOLATE, FERRITIN, TIBC, IRON, RETICCTPCT in the last 72 hours. Urine analysis:    Component Value Date/Time   COLORURINE AMBER (A) 05/22/2024 1851   APPEARANCEUR CLEAR 05/22/2024 1851   LABSPEC 1.044 (H) 05/22/2024 1851   PHURINE 5.0 05/22/2024 1851   GLUCOSEU NEGATIVE 05/22/2024 1851   HGBUR NEGATIVE 05/22/2024 1851   BILIRUBINUR NEGATIVE 05/22/2024 1851   KETONESUR NEGATIVE 05/22/2024 1851   PROTEINUR NEGATIVE 05/22/2024 1851   UROBILINOGEN 0.2 06/07/2013 1905   NITRITE NEGATIVE 05/22/2024 1851   LEUKOCYTESUR NEGATIVE 05/22/2024 1851   Sepsis Labs: Invalid input(s): PROCALCITONIN, LACTICIDVEN  Microbiology: No results found for this or  any previous visit (from the past 240 hours).  Radiology Studies: IR Paracentesis Result Date: 05/23/2024 INDICATION: 58 year old male with a history of cirrhosis with recurrent ascites. Request for therapeutic paracentesis. EXAM: ULTRASOUND GUIDED right PARACENTESIS MEDICATIONS: 1% lidocaine , 10 mL. COMPLICATIONS: None immediate. PROCEDURE: Informed written consent was obtained from the patient after a discussion of the risks, benefits and alternatives to treatment. A timeout was performed prior to the initiation of the procedure. Initial ultrasound scanning demonstrates a large amount of ascites within the right lower abdominal quadrant. The right lower abdomen was prepped and draped in the usual sterile fashion. 1% lidocaine  was used for local anesthesia. Following this, a 19 gauge, 7-cm, Yueh catheter was introduced. An ultrasound image was saved for documentation purposes. The paracentesis was performed. The catheter was removed and a dressing was applied. The patient tolerated the procedure well without immediate post procedural complication. FINDINGS:  A total of approximately 4.7 L of clear yellow fluid was removed. IMPRESSION: Successful ultrasound-guided paracentesis yielding 4.7 liters of peritoneal fluid. Procedure performed by: Estella Helling, PA-C under the supervision of Dr. Terrence Ferron Electronically Signed   By: Erica Hau M.D.   On: 05/23/2024 12:02   CT ABDOMEN PELVIS W CONTRAST Result Date: 05/22/2024 CLINICAL DATA:  Abdominal pain, acute, nonlocalized. EXAM: CT ABDOMEN AND PELVIS WITH CONTRAST TECHNIQUE: Multidetector CT imaging of the abdomen and pelvis was performed using the standard protocol following bolus administration of intravenous contrast. RADIATION DOSE REDUCTION: This exam was performed according to the departmental dose-optimization program which includes automated exposure control, adjustment of the mA and/or kV according to patient size and/or use of iterative  reconstruction technique. CONTRAST:  75mL OMNIPAQUE  IOHEXOL  350 MG/ML SOLN COMPARISON:  03/18/2024. FINDINGS: Lower chest: The heart is enlarged. There is a small pleural effusion on the right and a trace pleural effusion on the left with associated atelectasis at the lung bases. Hepatobiliary: Liver is small with a nodular contour compatible with underlying cirrhosis. No focal abnormality is seen. Stones are present within the gallbladder. No biliary ductal dilatation is seen. Pancreas: Unremarkable. No pancreatic ductal dilatation or surrounding inflammatory changes. Spleen: Left upper quadrant splenosis is unchanged. Adrenals/Urinary Tract: The adrenal glands are within normal limits. The kidneys enhance symmetrically. No renal calculus or hydronephrosis is seen. The bladder is unremarkable. Stomach/Bowel: Stomach is within normal limits. Appendix appears normal. No evidence of bowel wall thickening, distention, or inflammatory changes. No free air or pneumatosis. Scattered diverticular present along the colon without evidence of diverticulitis. Vascular/Lymphatic: Aortic atherosclerosis. 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 No abdominal or pelvic lymphadenopathy. Reproductive: Prostate is unremarkable. Other: Large ascites with extension of fluid into anterior abdominal wall hernias and left inguinal hernia. Anasarca is noted. Musculoskeletal: Degenerative changes are present in the thoracolumbar spine. No acute osseous abnormality is seen. IMPRESSION: 1. Large ascites and anasarca. 2. 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 3. Cholelithiasis. 4. Small left pleural effusion and trace right pleural effusion with associated atelectasis. 5. Diverticulosis without diverticulitis. 6. Aortic atherosclerosis.  Electronically Signed   By: Wyvonnia Heimlich M.D.   On: 05/22/2024 17:25    Scheduled Meds:  folic acid   1 mg Oral Daily   furosemide   80 mg Oral Daily   pantoprazole  (PROTONIX ) IV  40 mg Intravenous Q12H   PARoxetine   10 mg Oral Daily   thiamine   100 mg Oral Daily   Continuous Infusions:   LOS: 0 days   35 minutes with more than 50% spent in reviewing records, counseling patient/family and coordinating care.  Trenton Frock, MD [facility] [email or web address] 05/23/2024, 2:53 PM

## 2024-05-23 NOTE — Progress Notes (Addendum)
 Patient ID: Nathaniel Warren, male   DOB: 1966/01/27, 58 y.o.   MRN: [id number]     Attending physician's note   I have taken a history, reviewed the chart, and examined the patient. I performed a substantive portion of this encounter, including complete performance of at least one of the key components, in conjunction with the APP. I agree with the APP's note, impression, and recommendations with my edits.   58 year old male with history of EtOH cirrhosis c/b esophageal varices, hepatic encephalopathy, ascites, known from prior admission, presents with acute onset pain in his LLQ radiating down into his left inguinal area.  Pain started abruptly after trying to move quickly to the bathroom and felt a tearing, ripping sensation.  He reports a known left inguinal hernia and has been previously evaluated by a surgeon and told that he is not an operative candidate given his decompensated cirrhosis and actively undergoing transplant workup.  Recently admitted to [facility] 4/30 - 5/5 with elevated INR (9) and hypotension.  Held his carvedilol , Lasix , Aldactone .  Was started on rifaximin  for new hepatic encephalopathy.  Also diagnosed with subarachnoid hemorrhage and Xarelto  was permanently discontinued.  Admission evaluation notable for the following: - CT A/P: Large ascites and anasarca, cirrhotic appearing liver with portal hypertensive changes, small left pleural effusion with trace right pleural effusion, diverticulosis, multiple varices in RUQ and porta pedis. - Paracentesis with 4.7 L removed today by IR - CBC largely at baseline with H/H 8.9/25, MCV 110, PLT 52, WBC 6.2 - Albumin  1.7, and remainder of CMP at baseline with AST/ALT 65/25, ALP 246, T. 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  Not currently being  listed for transplant as he is 10 weeks post tobacco cessation and per patient needs 10 more weeks, along with clearance from his [facility] - Will plan to restart amiloride  and Lasix  when BP allows - We will plan to restart low-dose carvedilol  when BP allows - Depending on BP, may need to consider midodrine  - Continue rifaximin  - Can add lactulose  - IV albumin  after paracentesis today - Permanently discontinued Xarelto  - Follow-up with [facility].  Currently scheduled for 07/11/2024   5) Left inguinal hernia Reducible left inguinal hernia on exam.  Elevated risks for surgery given decompensated cirrhosis with elevated MELD. - Per [facility] cirrhosis surgical risk score, this would carry a 8.9% 30-day mortality, 10.5% 90-day mortality, 13.2% 180-day mortality, and 90-day decompensation of 26.6%.  - Child C cirrhosis classically carries a 50% mortality with elective intra-abdominal surgery   Nathaniel Lindau, DO, FACG [phone] office          Progress Note   Subjective   Day # 2 CC; abdominal pain-in setting of known decompensated EtOH related cirrhosis, rectal bleeding  CT abdomen and pelvis-enlarged heart, small pleural effusion on the right trace effusion on the left cirrhotic appearing liver gallstones present, no ductal dilation, unremarkable pancreas, enlarged spleen, multiple varices identified in the right upper quadrant and porta pedis, large amount of ascites/anasarca  Labs today-WBC 6.5/hemoglobin 8.5/hematocrit 24.9/MCV 109/platelets 50 Pro time 28.8/INR 2.7 MELD 3.0: 30 at 05/22/2024  4:16 PM MELD-Na: 29 at 05/22/2024  4:16 PM Calculated from: Serum Creatinine: 1.18 mg/dL at 1/47/8295  6:21 PM Serum Sodium: 135 mmol/L at 05/22/2024  3:12 PM Total Bilirubin: 10.2  mg/dL at 8/65/7846  9:62 PM Serum Albumin : 2.1 g/dL at 9/52/8413  2:44 PM INR(ratio): 2.7 at 05/22/2024  4:16 PM Age at listing (hypothetical): 58 years Sex: Male at 05/22/2024  4:16  PM    Just returned from paracentesis-4.7 L removed, no fluid studies has been ordered.  Patient says he does feel better post paracentesis and some of the pressure on his back has resolved, he still complaining of some pain more in the left side of his abdomen down into his groin though admits that that has improved post paracentesis as well.  He states the only reason he came to the hospital was because of this sharp pain that he experienced acutely while trying to get out of bed on Friday. Patient relates very recent admission at [facility], seems a bit confused about what medications he was supposed to take after that admission, he has not had follow-up yet with [facility] here. He had been taking his diuretics/Lasix . He has not had any rectal bleeding since coming to the hospital. Most recent admission here mid April 2025 for similar complaints of abdominal pain and bright red blood per rectum.   Objective   Vital signs in last 24 hours: Temp:  [97.6 F (36.4 C)-98.5 F (36.9 C)] 98.5 F (36.9 C) (06/16 0751) Pulse Rate:  [54-71] 69 (06/16 0751) Resp:  [6-19] 18 (06/16 0751) BP: (98-120)/(53-67) 101/54 (06/16 0751) SpO2:  [94 %-100 %] 94 % (06/16 0751) Weight:  [93 kg] 93 kg (06/15 1428)   General:    Older white male in NAD, jaundiced, talkative Heart:  Regular rate and rhythm; no murmurs Lungs: Respirations even and unlabored, lungs CTA bilaterally Abdomen: Nontense ascites, no focal tenderness, he does have some tenderness mildly in the left lower quadrant and into the left inguinal hernia ,, large midline ventral hernia, bowel sounds present Extremities:  Without edema. Neurologic:  Alert and oriented,  grossly normal neurologically.  No asterixis Psych:  Cooperative. Normal mood and affect.  Intake/Output from previous day: 06/15 0701 - 06/16 0700 In: -  Out: 300 [Urine:300] Intake/Output this shift: Total I/O In: -  Out: 200 [Urine:200]  Lab  Results: Recent Labs    05/22/24 1512 05/22/24 2140 05/23/24 0340  WBC 7.8 7.3 6.5  HGB 9.8* 9.9* 8.5*  HCT 28.1* 28.4* 24.9*  PLT 55* 53* 50*   BMET Recent Labs    05/22/24 1512  NA 135  K 3.6  CL 102  CO2 25  GLUCOSE 93  BUN 13  CREATININE 1.18  CALCIUM 8.6*   LFT Recent Labs    05/22/24 1512  PROT 7.4  ALBUMIN  2.1*  AST 80*  ALT 30  ALKPHOS 250*  BILITOT 10.2*   PT/INR Recent Labs    05/22/24 1616  LABPROT 28.8*  INR 2.7*    Studies/Results: CT ABDOMEN PELVIS W CONTRAST Result Date: 05/22/2024 CLINICAL DATA:  Abdominal pain, acute, nonlocalized. EXAM: CT ABDOMEN AND PELVIS WITH CONTRAST TECHNIQUE: Multidetector CT imaging of the abdomen and pelvis was performed using the standard protocol following bolus administration of intravenous contrast. RADIATION DOSE REDUCTION: This exam was performed according to the departmental dose-optimization program which includes automated exposure control, adjustment of the mA and/or kV according to patient size and/or use of iterative reconstruction technique. CONTRAST:  75mL OMNIPAQUE  IOHEXOL  350 MG/ML SOLN COMPARISON:  03/18/2024. FINDINGS: Lower chest: The heart is enlarged. There is a small pleural effusion on the right and a trace pleural effusion on the left with associated atelectasis  at the lung bases. Hepatobiliary: Liver is small with a nodular contour compatible with underlying cirrhosis. No focal abnormality is seen. Stones are present within the gallbladder. No biliary ductal dilatation is seen. Pancreas: Unremarkable. No pancreatic ductal dilatation or surrounding inflammatory changes. Spleen: Left upper quadrant splenosis is unchanged. Adrenals/Urinary Tract: The adrenal glands are within normal limits. The kidneys enhance symmetrically. No renal calculus or hydronephrosis is seen. The bladder is unremarkable. Stomach/Bowel: Stomach is within normal limits. Appendix appears normal. No evidence of bowel wall thickening,  distention, or inflammatory changes. No free air or pneumatosis. Scattered diverticular present along the colon without evidence of diverticulitis. Vascular/Lymphatic: Aortic atherosclerosis. 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 No abdominal or pelvic lymphadenopathy. Reproductive: Prostate is unremarkable. Other: Large ascites with extension of fluid into anterior abdominal wall hernias and left inguinal hernia. Anasarca is noted. Musculoskeletal: Degenerative changes are present in the thoracolumbar spine. No acute osseous abnormality is seen. IMPRESSION: 1. Large ascites and anasarca. 2. 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 3. Cholelithiasis. 4. Small left pleural effusion and trace right pleural effusion with associated atelectasis. 5. Diverticulosis without diverticulitis. 6. Aortic atherosclerosis. Electronically Signed   By: Wyvonnia Heimlich M.D.   On: 05/22/2024 17:25       Assessment / Plan:    #29 58 year old white male with decompensated EtOH related cirrhosis, complicated by somewhat refractory ascites, esophageal and gastric varices, and portal vein thrombosis Current MEL D.0 = 30 Admitted with complaints of abdominal pain after a 3 to 4-day diarrheal illness CT does not show any concerning findings to explain the abdominal pain, he does have a large ventral hernia and a left inguinal hernia, may have had increased abdominal pain due to increase in ascites and fluid tracking into the left inguinal hernia.  Stable status post large-volume paracentesis today, no fluid studies were ordered Give IV albumin   #2 history of portal vein thrombosis-patient had new diagnosis of small subarachnoid hemorrhage during recent admission at [facility] May 2025 Xarelto  discontinued  #3 rectal  bleeding-no active bleeding since admission, hemoglobin stable Colonoscopy August 2024 with planned external hemorrhoids noted, multiple polyps removed, diverticulosis noted-no rectal varices  #4 esophageal and gastric varices--continue carvedilol   Most recent notes from [facility] indicate that the patient is not currently being evaluated for liver transplant and not felt to be a transplant candidate as had not been able to document psychiatric follow-up and requires longer period of time tobacco free before reconsideration. Review of discharge summary from 04/06/2024/[facility] Lasix  and Coreg  had been held due to low blood pressure was to consider restarting as an outpatient Has not tolerated Aldactone  in the past/gynecomastia Started on Xifaxan  And as above Xarelto  was discontinued Patient does have a standing order for large-volume paracentesis every 2 weeks  Plan; continue 2 g sodium diet We will give IV albumin  today Continue Xifaxan  550 twice daily Restart Lasix  40 mg p.o. daily-blood pressure currently on the soft side 98/53 Restart K. Dur 20 mill equivalents daily Hold carvedilol  Stay off Xarelto  Patient may be stable for discharge tomorrow-will need follow-up with [facility]-has appointment scheduled for August, will try to get him a sooner appointment     Principal Problem:   Rectal bleed Active Problems:   Anxiety   MDD (major depressive disorder), recurrent severe, without psychosis (HCC)   GAD (  generalized anxiety disorder)   Seizures (HCC)   Posttraumatic stress disorder   Decompensated hepatic cirrhosis (HCC)   Portal hypertensive gastropathy (HCC)     LOS: 0 days   Amy Esterwood PA-C 05/23/2024, 10:44 AM

## 2024-05-24 DIAGNOSIS — K625 Hemorrhage of anus and rectum: Secondary | ICD-10-CM | POA: Diagnosis not present

## 2024-05-24 DIAGNOSIS — R109 Unspecified abdominal pain: Secondary | ICD-10-CM | POA: Diagnosis not present

## 2024-05-24 DIAGNOSIS — I8500 Esophageal varices without bleeding: Secondary | ICD-10-CM | POA: Diagnosis not present

## 2024-05-24 DIAGNOSIS — F1090 Alcohol use, unspecified, uncomplicated: Secondary | ICD-10-CM | POA: Diagnosis not present

## 2024-05-24 DIAGNOSIS — I959 Hypotension, unspecified: Secondary | ICD-10-CM

## 2024-05-24 DIAGNOSIS — K7031 Alcoholic cirrhosis of liver with ascites: Secondary | ICD-10-CM | POA: Diagnosis not present

## 2024-05-24 LAB — COMPREHENSIVE METABOLIC PANEL WITH GFR
ALT: 24 U/L (ref 0–44)
AST: 62 U/L — ABNORMAL HIGH (ref 15–41)
Albumin: 1.6 g/dL — ABNORMAL LOW (ref 3.5–5.0)
Alkaline Phosphatase: 230 U/L — ABNORMAL HIGH (ref 38–126)
Anion gap: 7 (ref 5–15)
BUN: 13 mg/dL (ref 6–20)
CO2: 24 mmol/L (ref 22–32)
Calcium: 8.1 mg/dL — ABNORMAL LOW (ref 8.9–10.3)
Chloride: 104 mmol/L (ref 98–111)
Creatinine, Ser: 1.42 mg/dL — ABNORMAL HIGH (ref 0.61–1.24)
GFR, Estimated: 58 mL/min — ABNORMAL LOW (ref >60)
Glucose, Bld: 112 mg/dL — ABNORMAL HIGH (ref 70–99)
Potassium: 3.9 mmol/L (ref 3.5–5.1)
Sodium: 135 mmol/L (ref 135–145)
Total Bilirubin: 8.4 mg/dL — ABNORMAL HIGH (ref 0.0–1.2)
Total Protein: 5.9 g/dL — ABNORMAL LOW (ref 6.5–8.1)

## 2024-05-24 LAB — CBC
HCT: 24.7 % — ABNORMAL LOW (ref 39.0–52.0)
Hemoglobin: 8.2 g/dL — ABNORMAL LOW (ref 13.0–17.0)
MCH: 37.3 pg — ABNORMAL HIGH (ref 26.0–34.0)
MCHC: 33.2 g/dL (ref 30.0–36.0)
MCV: 112.3 fL — ABNORMAL HIGH (ref 80.0–100.0)
Platelets: 48 10*3/uL — ABNORMAL LOW (ref 150–400)
RBC: 2.2 MIL/uL — ABNORMAL LOW (ref 4.22–5.81)
RDW: 17.2 % — ABNORMAL HIGH (ref 11.5–15.5)
WBC: 6 10*3/uL (ref 4.0–10.5)
nRBC: 0.3 % — ABNORMAL HIGH (ref 0.0–0.2)

## 2024-05-24 LAB — BASIC METABOLIC PANEL WITH GFR
Anion gap: 7 (ref 5–15)
BUN: 11 mg/dL (ref 6–20)
CO2: 23 mmol/L (ref 22–32)
Calcium: 8.2 mg/dL — ABNORMAL LOW (ref 8.9–10.3)
Chloride: 105 mmol/L (ref 98–111)
Creatinine, Ser: 1.41 mg/dL — ABNORMAL HIGH (ref 0.61–1.24)
GFR, Estimated: 58 mL/min — ABNORMAL LOW (ref >60)
Glucose, Bld: 128 mg/dL — ABNORMAL HIGH (ref 70–99)
Potassium: 3.8 mmol/L (ref 3.5–5.1)
Sodium: 135 mmol/L (ref 135–145)

## 2024-05-24 MED ORDER — AMILORIDE HCL 5 MG PO TABS
10.0000 mg | ORAL_TABLET | Freq: Every day | ORAL | Status: DC
  Administered 2024-05-24 – 2024-05-25 (×2): 10 mg via ORAL
  Filled 2024-05-24 (×3): qty 2

## 2024-05-24 MED ORDER — MIDODRINE HCL 5 MG PO TABS
5.0000 mg | ORAL_TABLET | Freq: Three times a day (TID) | ORAL | Status: DC
  Administered 2024-05-24 – 2024-05-25 (×3): 5 mg via ORAL
  Filled 2024-05-24 (×3): qty 1

## 2024-05-24 MED ORDER — LACTULOSE 10 GM/15ML PO SOLN: 10.0000 g | Freq: Two times a day (BID) | ORAL | Status: DC | PRN

## 2024-05-24 NOTE — Progress Notes (Addendum)
 Patient ID: Nathaniel Warren, male   DOB: 09/15/1966, 58 y.o.   MRN: [id number]     Attending physician's note   I have taken a history, reviewed the chart, and examined the patient. I performed a substantive portion of this encounter, including complete performance of at least one of the key components, in conjunction with the APP. I agree with the APP's note, impression, and recommendations with my edits.   No acute vents overnight.  States he is feeling much better today and hopeful for discharge home soon.  Creatinine bumped a bit today at 1.4.  Suspect this is multifactorial with the Lasix  80 mg that was given along with his large-volume paracentesis with 4.7 L removed creating a good degree of volume shifting and prerenal azotemia.  Discussed outpatient medication regimen which has been impacted by his hypotension and had been unable to take his diuretics and carvedilol  as prescribed.  Unfortunately, TIPS not a great option given his elevated MELD of 30.  - Start midodrine  - Will continue Lasix  40 mg daily - Starting amiloride  - Close BP monitoring with medication changes.  The hope is that we can find a stable regimen prior to discharge that he can continue taking as outpatient - Can hopefully get his follow-up with [facility] a bit sooner - Continue complete cessation of all tobacco - Continue rifaximin  - Low-sodium diet, limited to 2 g/day - Repeat BMP check in the morning - Limit paracentesis to 4 L  [name], DO, FACG [phone] office          Progress Note   Subjective   Day # 2 CC; decompensated cirrhosis, acute abdominal pain  Labs today-WBC 6.0/hemoglobin 8.2/hematocrit 24.7/MCV 112/platelets 48 Potassium 3.9/BUN 13/creatinine 1.42 T. bili 8.4/alk phos 230/AST 62/ALT 24  Patient says he is feeling better today was able to eat a large breakfast diarrhea has resolved had formed stool brown today has not had any evidence of rectal bleeding since  admission.  He admits that his abdominal pain has improved though he still is complaining of discomfort from the left inguinal hernia and continues to relate the sensation of a tearing type of feeling in his lower abdomen/left inguinal area that occurred 4 to 5 days ago. Asking about his blood pressure.   Objective   Vital signs in last 24 hours: Temp:  [98 F (36.7 C)-98.3 F (36.8 C)] 98.3 F (36.8 C) (06/17 0748) Pulse Rate:  [62-66] 62 (06/17 0748) Resp:  [17-18] 17 (06/17 0748) BP: (91-101)/(45-60) 100/60 (06/17 0748) SpO2:  [93 %-98 %] 98 % (06/17 0748) Last BM Date : 05/23/24 General:    Well-developed older white male in NAD, in good spirits, talkative Heart:  Regular rate and rhythm; no murmurs Lungs: Respirations even and unlabored, lungs CTA bilaterally Abdomen:  Soft, nontense ascites, normal bowel sounds.  Midline ventral hernia, nontender Extremities:  Without edema. Neurologic:  Alert and oriented,  grossly normal neurologically. Psych:  Cooperative. Normal mood and affect.  Intake/Output from previous day: 06/16 0701 - 06/17 0700 In: 90 [P.O.:90] Out: 300 [Urine:300] Intake/Output this shift: Total I/O In: 240 [P.O.:240] Out: -   Lab Results: Recent Labs    05/23/24 1022 05/23/24 1429 05/24/24 0447  WBC 6.2 5.8 6.0  HGB 8.9* 8.4* 8.2*  HCT 25.3* 24.3* 24.7*  PLT 52* 51* 48*   BMET Recent Labs    05/22/24 1512 05/23/24 1022 05/24/24 0447  NA 135 135 135  K 3.6 4.3 3.9  CL 102  106 104  CO2 25 26 24   GLUCOSE 93 86 112*  BUN 13 11 13   CREATININE 1.18 1.12 1.42*  CALCIUM 8.6* 8.3* 8.1*   LFT Recent Labs    05/24/24 0447  PROT 5.9*  ALBUMIN  1.6*  AST 62*  ALT 24  ALKPHOS 230*  BILITOT 8.4*   PT/INR Recent Labs    05/22/24 1616  LABPROT 28.8*  INR 2.7*    Studies/Results: IR Paracentesis Result Date: 05/23/2024 INDICATION: 58 year old male with a history of cirrhosis with recurrent ascites. Request for therapeutic paracentesis.  EXAM: ULTRASOUND GUIDED right PARACENTESIS MEDICATIONS: 1% lidocaine , 10 mL. COMPLICATIONS: None immediate. PROCEDURE: Informed written consent was obtained from the patient after a discussion of the risks, benefits and alternatives to treatment. A timeout was performed prior to the initiation of the procedure. Initial ultrasound scanning demonstrates a large amount of ascites within the right lower abdominal quadrant. The right lower abdomen was prepped and draped in the usual sterile fashion. 1% lidocaine  was used for local anesthesia. Following this, a 19 gauge, 7-cm, Yueh catheter was introduced. An ultrasound image was saved for documentation purposes. The paracentesis was performed. The catheter was removed and a dressing was applied. The patient tolerated the procedure well without immediate post procedural complication. FINDINGS: A total of approximately 4.7 L of clear yellow fluid was removed. IMPRESSION: Successful ultrasound-guided paracentesis yielding 4.7 liters of peritoneal fluid. Procedure performed by: Estella Helling, PA-C under the supervision of Dr. Terrence Ferron Electronically Signed   By: Erica Hau M.D.   On: 05/23/2024 12:02   CT ABDOMEN PELVIS W CONTRAST Result Date: 05/22/2024 CLINICAL DATA:  Abdominal pain, acute, nonlocalized. EXAM: CT ABDOMEN AND PELVIS WITH CONTRAST TECHNIQUE: Multidetector CT imaging of the abdomen and pelvis was performed using the standard protocol following bolus administration of intravenous contrast. RADIATION DOSE REDUCTION: This exam was performed according to the departmental dose-optimization program which includes automated exposure control, adjustment of the mA and/or kV according to patient size and/or use of iterative reconstruction technique. CONTRAST:  75mL OMNIPAQUE  IOHEXOL  350 MG/ML SOLN COMPARISON:  03/18/2024. FINDINGS: Lower chest: The heart is enlarged. There is a small pleural effusion on the right and a trace pleural effusion on the left  with associated atelectasis at the lung bases. Hepatobiliary: Liver is small with a nodular contour compatible with underlying cirrhosis. No focal abnormality is seen. Stones are present within the gallbladder. No biliary ductal dilatation is seen. Pancreas: Unremarkable. No pancreatic ductal dilatation or surrounding inflammatory changes. Spleen: Left upper quadrant splenosis is unchanged. Adrenals/Urinary Tract: The adrenal glands are within normal limits. The kidneys enhance symmetrically. No renal calculus or hydronephrosis is seen. The bladder is unremarkable. Stomach/Bowel: Stomach is within normal limits. Appendix appears normal. No evidence of bowel wall thickening, distention, or inflammatory changes. No free air or pneumatosis. Scattered diverticular present along the colon without evidence of diverticulitis. Vascular/Lymphatic: Aortic atherosclerosis. 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 No abdominal or pelvic lymphadenopathy. Reproductive: Prostate is unremarkable. Other: Large ascites with extension of fluid into anterior abdominal wall hernias and left inguinal hernia. Anasarca is noted. Musculoskeletal: Degenerative changes are present in the thoracolumbar spine. No acute osseous abnormality is seen. IMPRESSION: 1. Large ascites and anasarca. 2. 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 3. Cholelithiasis. 4. Small left pleural effusion and trace right pleural effusion  with associated atelectasis. 5. Diverticulosis without diverticulitis. 6. Aortic atherosclerosis. Electronically Signed   By: Wyvonnia Heimlich M.D.   On: 05/22/2024 17:25       Assessment / Plan:    #60 58 year old white male with decompensated EtOH related cirrhosis complicated by esophageal varices, recent diagnosis of  hepatic encephalopathy, ascites, who presented to the emergency room 2 days ago with complaint of rectal bleeding in the setting of acute diarrheal illness which had been present for 2 to 3 days, and also complaining of acute abdominal pain which occurred abruptly after he twisted acutely in bed trying to get out of bed.  After that he felt pain into the left abdomen and down into the left inguinal area.  CT showed extensive ascites, ventral hernias and a left inguinal hernia  Patient had large-volume paracentesis yesterday with removal of 4.7 L with improvement in his abdominal symptoms and improvement in the left inguinal hernia symptoms.  Pain has not completely resolved but has improved and he seems most focused today on the discomfort from the left inguinal hernia.  Current MEL D3.0=30 Patient is known to [facility] and has initiated transplant evaluation, not currently listed as has not completed psychiatric evaluation and cessation of tobacco completely  No fluid studies sent  #2 hypotension-blood pressure last p.m. 91/45-this was after large-volume paracentesis and higher doses of Lasix . Pressure still soft this a.m. 100/60  #3 acute kidney injury-bump in creatinine past 24 hours again in setting of large-volume paracentesis and higher dose diuretic  Patient would be a good candidate for TIPS for management of his ascites and is unable to push doses of diuretics.  He is also currently unable to tolerate carvedilol  for hypertension.  However his MELD is currently too high to be considered for TIPS. This may be an option in the future, but MELD will need to be less than 20  #4 hepatic encephalopathy-mild, currently only on Xifaxan  #5 ventral hernias and symptomatic left inguinal hernia-is high risk surgical candidate.  Would not offer surgical repair of the left inguinal hernia.  Advised he get a jockstrap for support for home use  Plan; will start midodrine  5 mg p.o. 3 times daily  for hypotension Continue Lasix  at 40 mg p.o. every morning Amiloride  10 mg p.o. daily Repeat BMET in a.m. If blood pressure stable with above changes and improvement in creatinine he may be able to be discharged home tomorrow, will need sooner appointment with [facility], and careful monitoring of labs as an outpatient. Continue 2 g sodium diet        Principal Problem:   Rectal bleed Active Problems:   Anxiety   MDD (major depressive disorder), recurrent severe, without psychosis (HCC)   GAD (generalized anxiety disorder)   Seizures (HCC)   Posttraumatic stress disorder   Decompensated hepatic cirrhosis (HCC)   Portal hypertensive gastropathy (HCC)     LOS: 0 days   Amy EsterwoodPA-C  05/24/2024, 11:39 AM

## 2024-05-24 NOTE — Progress Notes (Signed)
 PROGRESS NOTE  Nathaniel Warren  ZOX:[id number] DOB: 1966/11/11 DOA: 05/22/2024 PCP: Jerrlyn Morel, NP  Consultants  Brief Narrative: 58 y.o. male with a PMH significant for liver cirrhosis, history of drug abuse, seizure disorder, tobacco abuse, prior seizures, history of stroke, depression with anxiety who presents to the ER with complaint of rectal bleed.  Patient has known alcoholic cirrhosis with MELD score of 30 with known history also of small esophageal varices from previous EGD about 2 months ago. No further rectal bleeding since admit, now here mostly for management of cirrhosis.    Assessment & Plan: #1 Rectal bleeding  - Suspected due to varices.   - No further bleeding since admission. - Hemoglobin has been stable. - Continue Protonix  and trend hemoglobin  #2 ascites with anasarca:  - Status post therapeutic paracentesis with removal of 4.7 L 6/16. - Patient better afterwards. Remains better today as well.   - Appreciate IR    #3 Decompensated alcoholic cirrhosis:  - Elevated MELD score.  Hyperbilirubinemia noted and has been chronic. - No hepatic encephalopathy.   - hypotension noted.  Midodrine  being started per GI, agree with plan.  - Starting lactulose  today  AKI:   - elevation of creatinine today.   - s/p high volume paracentesis.   - midodrine  started as above, will trend.    Lower inguinal hernia: - described tearing pain that initiated. - CT reveals the same, as does physical exam - not incarcerated or strangulated - agree with extra support -- high risk from surgical standpoint   Generalized anxiety disorder:  - Continue home regimen.  Controlled   Seizure disorder:  - Will resume home regimen, no issues this admit       Pressure Injury 02/09/23 Buttocks Left Stage 2 -  Partial thickness loss of dermis presenting as a shallow open injury with a red, pink wound bed without slough. (Active)  02/09/23 1710  Location: Buttocks  Location Orientation:  Left  Staging: Stage 2 -  Partial thickness loss of dermis presenting as a shallow open injury with a red, pink wound bed without slough.  Wound Description (Comments):   DO NOT USE:  Present on Admission: No   DVT prophylaxis:  SCDs Start: 05/22/24 2024  Code Status:   Code Status: Full Code Level of care: Med-Surg Status is: Observation Dispo: Awaiting results of paracentesis and likely able to discharge home 6/17   Consults called: Gastroenterology  Subjective: Patient without complaints today except inguinal hernia pain.    Objective: Vitals:   05/23/24 1646 05/23/24 1948 05/24/24 0515 05/24/24 0748  BP: (!) 91/45 (!) 100/53 (!) 101/55 100/60  Pulse: 66 63 62 62  Resp: 18 18 18 17   Temp:  98 F (36.7 C) 98.3 F (36.8 C) 98.3 F (36.8 C)  TempSrc:  Oral Oral Oral  SpO2: 98% 97% 93% 98%  Weight:      Height:        Intake/Output Summary (Last 24 hours) at 05/24/2024 1437 Last data filed at 05/24/2024 0930 Gross per 24 hour  Intake 330 ml  Output 100 ml  Net 230 ml   Filed Weights   05/22/24 1428  Weight: 93 kg   Body mass index is 30.27 kg/m.  Gen: 58 y.o. male in no apparent distress.  Nontoxic.  Diffuse jaundice noticed.  Also scleral icterus Pulm: Non-labored breathing.  Clear to auscultation bilaterally.  CV: Regular rate and rhythm. No murmur, rub, or gallop. No JVD GI: Abdomen soft  and only minimally distended.  Nontender to palpation except for deep palpation right upper quadrant Ext: Warm, no deformities, +2 bilateral pedal edema Skin: No rashes, lesions no ulcers Neuro: Alert and oriented. No focal neurological deficits. Psych: Calm  Judgement and insight appear normal. Mood & affect appropriate.     I have personally reviewed the following labs and images: CBC: Recent Labs  Lab 05/22/24 1512 05/22/24 2140 05/23/24 0340 05/23/24 1022 05/23/24 1429 05/24/24 0447  WBC 7.8 7.3 6.5 6.2 5.8 6.0  NEUTROABS 3.8  --   --   --   --   --   HGB  9.8* 9.9* 8.5* 8.9* 8.4* 8.2*  HCT 28.1* 28.4* 24.9* 25.3* 24.3* 24.7*  MCV 110.2* 111.8* 109.7* 110.0* 110.5* 112.3*  PLT 55* 53* 50* 52* 51* 48*   BMP &GFR Recent Labs  Lab 05/22/24 1512 05/23/24 1022 05/24/24 0447 05/24/24 1242  NA 135 135 135 135  K 3.6 4.3 3.9 3.8  CL 102 106 104 105  CO2 25 26 24 23   GLUCOSE 93 86 112* 128*  BUN 13 11 13 11   CREATININE 1.18 1.12 1.42* 1.41*  CALCIUM 8.6* 8.3* 8.1* 8.2*   Estimated Creatinine Clearance: 65.9 mL/min (A) (by C-G formula based on SCr of 1.41 mg/dL (H)). Liver & Pancreas: Recent Labs  Lab 05/22/24 1512 05/23/24 1022 05/24/24 0447  AST 80* 65* 62*  ALT 30 25 24   ALKPHOS 250* 246* 230*  BILITOT 10.2* 9.6* 8.4*  PROT 7.4 6.4* 5.9*  ALBUMIN  2.1* 1.7* 1.6*   Recent Labs  Lab 05/22/24 1512  LIPASE 67*   No results for input(s): AMMONIA in the last 168 hours. Diabetic: No results for input(s): HGBA1C in the last 72 hours. No results for input(s): GLUCAP in the last 168 hours. Cardiac Enzymes: No results for input(s): CKTOTAL, CKMB, CKMBINDEX, TROPONINI in the last 168 hours. No results for input(s): PROBNP in the last 8760 hours. Coagulation Profile: Recent Labs  Lab 05/22/24 1616  INR 2.7*   Thyroid Function Tests: No results for input(s): TSH, T4TOTAL, FREET4, T3FREE, THYROIDAB in the last 72 hours. Lipid Profile: No results for input(s): CHOL, HDL, LDLCALC, TRIG, CHOLHDL, LDLDIRECT in the last 72 hours. Anemia Panel: No results for input(s): VITAMINB12, FOLATE, FERRITIN, TIBC, IRON, RETICCTPCT in the last 72 hours. Urine analysis:    Component Value Date/Time   COLORURINE AMBER (A) 05/22/2024 1851   APPEARANCEUR CLEAR 05/22/2024 1851   LABSPEC 1.044 (H) 05/22/2024 1851   PHURINE 5.0 05/22/2024 1851   GLUCOSEU NEGATIVE 05/22/2024 1851   HGBUR NEGATIVE 05/22/2024 1851   BILIRUBINUR NEGATIVE 05/22/2024 1851   KETONESUR NEGATIVE 05/22/2024 1851    PROTEINUR NEGATIVE 05/22/2024 1851   UROBILINOGEN 0.2 06/07/2013 1905   NITRITE NEGATIVE 05/22/2024 1851   LEUKOCYTESUR NEGATIVE 05/22/2024 1851   Sepsis Labs: Invalid input(s): PROCALCITONIN, LACTICIDVEN  Microbiology: No results found for this or any previous visit (from the past 240 hours).  Radiology Studies: No results found.   Scheduled Meds:  aMILoride   10 mg Oral Daily   folic acid   1 mg Oral Daily   furosemide   40 mg Oral Daily   midodrine   5 mg Oral TID WC   PARoxetine   10 mg Oral Daily   potassium chloride   20 mEq Oral Daily   thiamine   100 mg Oral Daily   Continuous Infusions:   LOS: 0 days   35 minutes with more than 50% spent in reviewing records, counseling patient/family and coordinating care.  Derald Flattery  Betsey Brow, MD [facility] [email or web address] 05/24/2024, 2:37 PM

## 2024-05-24 NOTE — Plan of Care (Signed)

## 2024-05-24 NOTE — Plan of Care (Signed)

## 2024-05-25 ENCOUNTER — Telehealth: Payer: Self-pay

## 2024-05-25 ENCOUNTER — Other Ambulatory Visit (HOSPITAL_COMMUNITY): Payer: Self-pay

## 2024-05-25 ENCOUNTER — Other Ambulatory Visit (HOSPITAL_BASED_OUTPATIENT_CLINIC_OR_DEPARTMENT_OTHER): Payer: Self-pay

## 2024-05-25 DIAGNOSIS — R109 Unspecified abdominal pain: Secondary | ICD-10-CM | POA: Diagnosis not present

## 2024-05-25 DIAGNOSIS — K3189 Other diseases of stomach and duodenum: Secondary | ICD-10-CM

## 2024-05-25 DIAGNOSIS — K7460 Unspecified cirrhosis of liver: Secondary | ICD-10-CM

## 2024-05-25 DIAGNOSIS — K7030 Alcoholic cirrhosis of liver without ascites: Secondary | ICD-10-CM

## 2024-05-25 DIAGNOSIS — I8500 Esophageal varices without bleeding: Secondary | ICD-10-CM | POA: Diagnosis not present

## 2024-05-25 DIAGNOSIS — K7290 Hepatic failure, unspecified without coma: Secondary | ICD-10-CM

## 2024-05-25 DIAGNOSIS — K625 Hemorrhage of anus and rectum: Secondary | ICD-10-CM | POA: Diagnosis not present

## 2024-05-25 DIAGNOSIS — F1090 Alcohol use, unspecified, uncomplicated: Secondary | ICD-10-CM | POA: Diagnosis not present

## 2024-05-25 DIAGNOSIS — K766 Portal hypertension: Secondary | ICD-10-CM | POA: Diagnosis not present

## 2024-05-25 DIAGNOSIS — R188 Other ascites: Secondary | ICD-10-CM

## 2024-05-25 DIAGNOSIS — K7682 Hepatic encephalopathy: Secondary | ICD-10-CM

## 2024-05-25 DIAGNOSIS — I959 Hypotension, unspecified: Secondary | ICD-10-CM

## 2024-05-25 DIAGNOSIS — N179 Acute kidney failure, unspecified: Secondary | ICD-10-CM

## 2024-05-25 DIAGNOSIS — K7031 Alcoholic cirrhosis of liver with ascites: Secondary | ICD-10-CM | POA: Diagnosis not present

## 2024-05-25 LAB — COMPREHENSIVE METABOLIC PANEL WITH GFR
ALT: 26 U/L (ref 0–44)
AST: 70 U/L — ABNORMAL HIGH (ref 15–41)
Albumin: 1.7 g/dL — ABNORMAL LOW (ref 3.5–5.0)
Alkaline Phosphatase: 240 U/L — ABNORMAL HIGH (ref 38–126)
Anion gap: 9 (ref 5–15)
BUN: 10 mg/dL (ref 6–20)
CO2: 21 mmol/L — ABNORMAL LOW (ref 22–32)
Calcium: 8.1 mg/dL — ABNORMAL LOW (ref 8.9–10.3)
Chloride: 103 mmol/L (ref 98–111)
Creatinine, Ser: 1.3 mg/dL — ABNORMAL HIGH (ref 0.61–1.24)
GFR, Estimated: >60 mL/min (ref >60)
Glucose, Bld: 79 mg/dL (ref 70–99)
Potassium: 4.1 mmol/L (ref 3.5–5.1)
Sodium: 133 mmol/L — ABNORMAL LOW (ref 135–145)
Total Bilirubin: 8.7 mg/dL — ABNORMAL HIGH (ref 0.0–1.2)
Total Protein: 6.1 g/dL — ABNORMAL LOW (ref 6.5–8.1)

## 2024-05-25 LAB — CBC
HCT: 24.6 % — ABNORMAL LOW (ref 39.0–52.0)
Hemoglobin: 8.6 g/dL — ABNORMAL LOW (ref 13.0–17.0)
MCH: 38.2 pg — ABNORMAL HIGH (ref 26.0–34.0)
MCHC: 35 g/dL (ref 30.0–36.0)
MCV: 109.3 fL — ABNORMAL HIGH (ref 80.0–100.0)
Platelets: 57 10*3/uL — ABNORMAL LOW (ref 150–400)
RBC: 2.25 MIL/uL — ABNORMAL LOW (ref 4.22–5.81)
RDW: 16.5 % — ABNORMAL HIGH (ref 11.5–15.5)
WBC: 5.8 10*3/uL (ref 4.0–10.5)
nRBC: 0.3 % — ABNORMAL HIGH (ref 0.0–0.2)

## 2024-05-25 MED ORDER — OXYCODONE HCL 5 MG PO TABS
5.0000 mg | ORAL_TABLET | Freq: Two times a day (BID) | ORAL | 0 refills | Status: AC | PRN
  Filled 2024-05-25 (×2): qty 12, 3d supply, fill #0

## 2024-05-25 MED ORDER — LACTULOSE 10 GM/15ML PO SOLN
10.0000 g | Freq: Two times a day (BID) | ORAL | 0 refills | Status: DC | PRN
  Filled 2024-05-25 (×2): qty 236, 8d supply, fill #0

## 2024-05-25 MED ORDER — MIDODRINE HCL 5 MG PO TABS
5.0000 mg | ORAL_TABLET | Freq: Three times a day (TID) | ORAL | 0 refills | Status: DC
  Filled 2024-05-25 (×2): qty 90, 30d supply, fill #0

## 2024-05-25 NOTE — Progress Notes (Signed)
 Transition of Care [facility]) - Inpatient Brief Assessment   Patient Details  Name: Nathaniel Warren MRN: [id number] Date of Birth: November 09, 1966  Transition of Care [facility]) CM/SW Contact:    Dane Dung, RN Phone Number: 05/25/2024, 11:20 AM   Clinical Narrative: Patient admitted for rectal bleeding - history of smoking and substance abuse history.  Resources placed in the AVS for substance abuse and smoking cessation.  Patient has listed PCP.  No TOC needs at this time.   Transition of Care Asessment: Insurance and Status: (P) Insurance coverage has been reviewed Patient has primary care physician: (P) Yes Home environment has been reviewed: (P) from home   Prior/Current Home Services: (P) No current home services Social Drivers of Health Review: (P) SDOH reviewed interventions complete Readmission risk has been reviewed: (P) Yes Transition of care needs: (P) no transition of care needs at this time

## 2024-05-25 NOTE — Discharge Summary (Signed)
 Physician Discharge Summary   Patient: Nathaniel Warren MRN: [id number] DOB: 1965-12-11  Admit date:     05/22/2024  Discharge date: 05/25/24  Discharge Physician: Trenton Frock   PCP: Jerrlyn Morel, NP   Recommendations at discharge:   Patient started in house this admission on midodrine  5 mg p.o. 3 times daily.  Also started on Lasix  p.o. every morning and amiloride  10 mg p.o. every morning.  Discharged home on the same.  Per gastroenterology, patient started on rifaximin  550 twice daily and lactulose  30 mg twice daily.  Goal is 2-3 bowel movements a day.  Discharged home on the same today.  Ensure follow-up with PCP and [facility] outpatient in the next 2 weeks with PCP in 3 to 4 weeks with [facility]ology.  Patient also discharged with oxycodone  every 6 hours for severe pain.  Prescription provided.  Discharge Diagnoses: Principal Problem:   Rectal bleed Active Problems:   Anxiety   MDD (major depressive disorder), recurrent severe, without psychosis (HCC)   GAD (generalized anxiety disorder)   Seizures (HCC)   Posttraumatic stress disorder   Decompensated hepatic cirrhosis (HCC)   Portal hypertensive gastropathy (HCC)   Arterial hypotension  Resolved Problems:   * No resolved hospital problems. *  Hospital Course: 58 y.o. male with a PMH significant for liver cirrhosis, history of drug abuse, seizure disorder, tobacco abuse, prior seizures, history of stroke, depression with anxiety who presents to the ER with complaint of rectal bleed.  Patient has known alcoholic cirrhosis with MELD score of 30 with known history also of small esophageal varices from previous EGD about 2 months ago. No further rectal bleeding since admit, now here mostly for management of cirrhosis.  No further bleeding entire admission.  He was status post large-volume paracentesis with removal of 4.7 L.  Vast improvement in his symptoms.  Did also have complaints of left inguinal hernia which was easily  reducible.  No evidence of strangulation or incarceration.  He had a slight bump in his creatinine but this trended down to 1.3 on day of discharge.  GI consulted and followed while in house.  Started on amiloride  10 mg daily with continue Lasix  40 mg daily.  Also started on midodrine  this hospitalization due to hypotension limiting diuretics in house.  Blood pressure remained above 100 systolic after starting midodrine .  Discharged home on the same.  Patient was discouraged with his nursing care while in house although appreciative of care from his physicians.  Due to his degree of improvement he was discharged home on 05/25/2024.   Assessment & Plan: #1 Rectal bleeding  - Suspected due to varices.   - No further bleeding since admission.  And hemoglobin stable.   #2 ascites with anasarca:  - Status post therapeutic paracentesis with removal of 4.7 L 6/16.   #3 Decompensated alcoholic cirrhosis:  - Elevated MELD score.  Hyperbilirubinemia noted and has been chronic. -Discharged home on Lasix , amiloride , midodrine  5 mg p.o. 3 times daily. -Blood pressure improved after starting midodrine . -Lactulose  also started in house and rifaximin  increased to twice daily dosing to prevent hepatic encephalopathy.  No confusion while in house. -Goal is 2-3 bowel movements daily.  Instructed on the same.   AKI:   - Trending down towards normal on discharge. - Thought to be secondary to high volume paracentesis. - Recommendation is 4 L or less future paracenteses to prevent recurrence   Lower inguinal hernia: - described tearing pain that initiated when moving  around the bed. - CT reveals the same, as does physical exam - not incarcerated or strangulated - agree with extra support, recommended in house to wear a jockstrap or some other support-- high risk from surgical standpoint   Generalized anxiety disorder:  - Continue home regimen.  Controlled   Seizure disorder:  - Will resume home regimen, no  issues this admit       Pain control - [place]  Controlled Substance Reporting System database was reviewed. and patient was instructed, not to drive, operate heavy machinery, perform activities at heights, swimming or participation in water activities or provide baby-sitting services while on Pain, Sleep and Anxiety Medications; until their outpatient Physician has advised to do so again. Also recommended to not to take more than prescribed Pain, Sleep and Anxiety Medications.  Consultants: Gastroenterology Procedures performed: Paracentesis Disposition: Home Diet recommendation:  Discharge Diet Orders (From admission, onward)     Start     Ordered   05/25/24 0000  Diet - low sodium heart healthy        05/25/24 1253           Diet: 2 g sodium diet DISCHARGE MEDICATION: Allergies as of 05/25/2024       Reactions   Nsaids Other (See Comments)   Cannot take due to liver condition        Medication List     TAKE these medications    aMILoride  5 MG tablet Commonly known as: MIDAMOR  Take 2 tablets (10 mg total) by mouth daily.   folic acid  1 MG tablet Commonly known as: FOLVITE  Take 1 tablet (1 mg total) by mouth daily.   furosemide  40 MG tablet Commonly known as: LASIX  Take 2 tablets (80 mg total) by mouth daily.   lactulose  10 GM/15ML solution Commonly known as: CHRONULAC  Take 15 mLs (10 g total) by mouth 2 (two) times daily as needed for mild constipation.   midodrine  5 MG tablet Commonly known as: PROAMATINE  Take 1 tablet (5 mg total) by mouth 3 (three) times daily with meals.   nicotine  14 mg/24hr patch Commonly known as: Nicotine  Step 2 Place 1 patch on the skin daily as needed (for nicotine  craving if patient desires).   oxyCODONE  5 MG immediate release tablet Commonly known as: Oxy IR/ROXICODONE  Take 1-2 tablets (5-10 mg total) by mouth 2 (two) times daily as needed for up to 3 days for moderate pain (pain score 4-6).   pantoprazole  40 MG  tablet Commonly known as: PROTONIX  Take 1 tablet (40 mg total) by mouth daily.   PARoxetine  10 MG tablet Commonly known as: PAXIL  Take 10 mg by mouth daily.   potassium chloride  SA 20 MEQ tablet Commonly known as: KLOR-CON  M Take 2 tablets (40 mEq total) by mouth daily for 1 day, THEN 1 tablet (20 mEq total) daily. Start taking on: April 02, 2024   QUEtiapine  25 MG tablet Commonly known as: SEROQUEL  TAKE 1 TABLET BY MOUTH AT BEDTIME NEED  OFFICE  VISIT What changed: See the new instructions.   senna 8.6 MG Tabs tablet Commonly known as: SENOKOT Take 2 tablets (17.2 mg total) by mouth every evening.   thiamine  100 MG tablet Commonly known as: VITAMIN B1 Take 1 tablet (100 mg total) by mouth daily.   Xifaxan  550 MG Tabs tablet Generic drug: rifaximin  Take 1 tablet (550 mg total) by mouth 2 (two) times daily.        Discharge Exam: Filed Weights   05/22/24 1428  Weight:  93 kg   Gen: 58 y.o. male in no apparent distress.  Nontoxic.  Diffuse jaundice noticed.  Also scleral icterus Pulm: Non-labored breathing.  Clear to auscultation bilaterally.  CV: Regular rate and rhythm. No murmur, rub, or gallop. No JVD GI: Abdomen soft and only minimally distended.  Nontender to palpation except for deep palpation right upper quadrant.  Inguinal hernia reducible and nonpainful with direct palpation Ext: Warm, no deformities, +2 bilateral pedal edema Skin: No rashes, lesions no ulcers Neuro: Alert and oriented. No focal neurological deficits. Psych: Calm  Judgement and insight appear normal. Mood & affect appropriate.   Condition at discharge: Good  The results of significant diagnostics from this hospitalization (including imaging, microbiology, ancillary and laboratory) are listed below for reference.   Imaging Studies: IR Paracentesis Result Date: 05/23/2024 INDICATION: 58 year old male with a history of cirrhosis with recurrent ascites. Request for therapeutic paracentesis.  EXAM: ULTRASOUND GUIDED right PARACENTESIS MEDICATIONS: 1% lidocaine , 10 mL. COMPLICATIONS: None immediate. PROCEDURE: Informed written consent was obtained from the patient after a discussion of the risks, benefits and alternatives to treatment. A timeout was performed prior to the initiation of the procedure. Initial ultrasound scanning demonstrates a large amount of ascites within the right lower abdominal quadrant. The right lower abdomen was prepped and draped in the usual sterile fashion. 1% lidocaine  was used for local anesthesia. Following this, a 19 gauge, 7-cm, Yueh catheter was introduced. An ultrasound image was saved for documentation purposes. The paracentesis was performed. The catheter was removed and a dressing was applied. The patient tolerated the procedure well without immediate post procedural complication. FINDINGS: A total of approximately 4.7 L of clear yellow fluid was removed. IMPRESSION: Successful ultrasound-guided paracentesis yielding 4.7 liters of peritoneal fluid. Procedure performed by: Estella Helling, PA-C under the supervision of Dr. Terrence Ferron Electronically Signed   By: Erica Hau M.D.   On: 05/23/2024 12:02   CT ABDOMEN PELVIS W CONTRAST Result Date: 05/22/2024 CLINICAL DATA:  Abdominal pain, acute, nonlocalized. EXAM: CT ABDOMEN AND PELVIS WITH CONTRAST TECHNIQUE: Multidetector CT imaging of the abdomen and pelvis was performed using the standard protocol following bolus administration of intravenous contrast. RADIATION DOSE REDUCTION: This exam was performed according to the departmental dose-optimization program which includes automated exposure control, adjustment of the mA and/or kV according to patient size and/or use of iterative reconstruction technique. CONTRAST:  75mL OMNIPAQUE  IOHEXOL  350 MG/ML SOLN COMPARISON:  03/18/2024. FINDINGS: Lower chest: The heart is enlarged. There is a small pleural effusion on the right and a trace pleural effusion on the left  with associated atelectasis at the lung bases. Hepatobiliary: Liver is small with a nodular contour compatible with underlying cirrhosis. No focal abnormality is seen. Stones are present within the gallbladder. No biliary ductal dilatation is seen. Pancreas: Unremarkable. No pancreatic ductal dilatation or surrounding inflammatory changes. Spleen: Left upper quadrant splenosis is unchanged. Adrenals/Urinary Tract: The adrenal glands are within normal limits. The kidneys enhance symmetrically. No renal calculus or hydronephrosis is seen. The bladder is unremarkable. Stomach/Bowel: Stomach is within normal limits. Appendix appears normal. No evidence of bowel wall thickening, distention, or inflammatory changes. No free air or pneumatosis. Scattered diverticular present along the colon without evidence of diverticulitis. Vascular/Lymphatic: Aortic atherosclerosis. 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 No abdominal or pelvic lymphadenopathy. Reproductive: Prostate is unremarkable. Other: Large ascites with extension of fluid into anterior  abdominal wall hernias and left inguinal hernia. Anasarca is noted. Musculoskeletal: Degenerative changes are present in the thoracolumbar spine. No acute osseous abnormality is seen. IMPRESSION: 1. Large ascites and anasarca. 2. 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 3. Cholelithiasis. 4. Small left pleural effusion and trace right pleural effusion with associated atelectasis. 5. Diverticulosis without diverticulitis. 6. Aortic atherosclerosis. Electronically Signed   By: Wyvonnia Heimlich M.D.   On: 05/22/2024 17:25    Microbiology: Results for orders placed or performed during the hospital encounter of 03/18/24  Culture, blood (Routine X 2) w Reflex to ID Panel      Status: None   Collection Time: 03/18/24  4:00 PM   Specimen: BLOOD  Result Value Ref Range Status   Specimen Description   Final    BLOOD LEFT ANTECUBITAL Performed at [facility], [address] [place], [place] [address]    Special Requests   Final    BOTTLES DRAWN AEROBIC AND ANAEROBIC Blood Culture adequate volume Performed at [facility], [address] [place], [place] [address]    Culture   Final    NO GROWTH 5 DAYS Performed at [facility], [address] [place], [place] [address]    Report Status 03/23/2024 FINAL  Final  Culture, blood (Routine X 2) w Reflex to ID Panel     Status: None   Collection Time: 03/18/24  4:02 PM   Specimen: BLOOD  Result Value Ref Range Status   Specimen Description   Final    BLOOD RIGHT ANTECUBITAL Performed at [facility], [address], [place], [place] [address]    Special Requests   Final    BOTTLES DRAWN AEROBIC AND ANAEROBIC Blood Culture adequate volume Performed at [facility], [address], [place], [place] [address]    Culture   Final    NO GROWTH 5 DAYS Performed at [facility], [address] [place], [place] [address]    Report Status 03/23/2024 FINAL  Final  Body fluid culture w Gram Stain     Status: None   Collection Time: 03/21/24 10:16 AM   Specimen: PATH Cytology Peritoneal fluid  Result Value Ref Range Status   Specimen Description   Final    PERITONEAL Performed at [facility], [address], [place], [place] [address]    Special Requests   Final    NONE Performed at [facility], [address], [place], [place] [address]    Gram Stain NO WBC SEEN NO ORGANISMS SEEN   Final   Culture   Final    NO GROWTH 3 DAYS Performed at [facility], [address], [place], [place] [address]    Report Status 03/24/2024 FINAL  Final    Labs: CBC: Recent Labs  Lab  05/22/24 1512 05/22/24 2140 05/23/24 0340 05/23/24 1022 05/23/24 1429 05/24/24 0447 05/25/24 0324  WBC 7.8   < > 6.5 6.2 5.8 6.0 5.8  NEUTROABS 3.8  --   --   --   --   --   --   HGB 9.8*   < > 8.5* 8.9* 8.4* 8.2* 8.6*  HCT 28.1*   < > 24.9* 25.3* 24.3* 24.7* 24.6*  MCV 110.2*   < > 109.7* 110.0* 110.5* 112.3* 109.3*  PLT 55*   < > 50* 52* 51* 48* 57*   < > =  values in this interval not displayed.   Basic Metabolic Panel: Recent Labs  Lab 05/22/24 1512 05/23/24 1022 05/24/24 0447 05/24/24 1242 05/25/24 0324  NA 135 135 135 135 133*  K 3.6 4.3 3.9 3.8 4.1  CL 102 106 104 105 103  CO2 25 26 24 23  21*  GLUCOSE 93 86 112* 128* 79  BUN 13 11 13 11 10   CREATININE 1.18 1.12 1.42* 1.41* 1.30*  CALCIUM 8.6* 8.3* 8.1* 8.2* 8.1*   Liver Function Tests: Recent Labs  Lab 05/22/24 1512 05/23/24 1022 05/24/24 0447 05/25/24 0324  AST 80* 65* 62* 70*  ALT 30 25 24 26   ALKPHOS 250* 246* 230* 240*  BILITOT 10.2* 9.6* 8.4* 8.7*  PROT 7.4 6.4* 5.9* 6.1*  ALBUMIN  2.1* 1.7* 1.6* 1.7*   CBG: No results for input(s): GLUCAP in the last 168 hours.  Discharge time spent: Less than 30 minutes.  Signed: Trenton Frock, MD [facility] 05/25/2024

## 2024-05-25 NOTE — Progress Notes (Addendum)
 Patient ID: Nathaniel Warren, male   DOB: 06/05/1966, 58 y.o.   MRN: [id number]     Attending physician's note   I have taken a history, reviewed the chart, and examined the patient. I performed a substantive portion of this encounter, including complete performance of at least one of the key components, in conjunction with the APP. I agree with the APP's note, impression, and recommendations with my edits.  Patient eager for discharge home today.  Renal function improving with creatinine 1.3 today.  Otherwise stable liver enzymes and CBC.  BP improved after starting midodrine .  Plan for discharge home later today with medication adjustments as outlined below.  Please ensure these medication changes are clearly outlined on his discharge instructions with discharge medications sent into the pharmacy.  Nathaniel Gosse, DO, FACG [phone] office          Progress Note   Subjective   Day # 3 CC; decompensated cirrhosis, acute abdominal pain  Today-WBC 5.8/hemoglobin 8.6/hematocrit 24.6/platelets 57 Sodium 133/potassium 4.1/BUN 10/creatinine 1.3 improved  Midodrine  added yesterday, blood pressures improved to 100/54 this a.m.  Patient unhappy about his care both yesterday and today and overnight.  Says he had been left for many hours without seeing a nurse during all of these shifts Overall feeling okay still having some pain from the inguinal hernia especially with coughing etc. He does feel well enough to go home   Objective   Vital signs in last 24 hours: Temp:  [97.7 F (36.5 C)-98.2 F (36.8 C)] 98.2 F (36.8 C) (06/18 0723) Pulse Rate:  [52-61] 52 (06/18 0723) Resp:  [17-18] 18 (06/18 0723) BP: (98-105)/(52-57) 100/54 (06/18 0723) SpO2:  [95 %-99 %] 95 % (06/18 0723) Last BM Date : 05/24/24 General:    white male  in NAD Heart:  Regular rate and rhythm; no murmurs Lungs: Respirations even and unlabored, lungs CTA bilaterally Abdomen:  Soft, nontense ascites normal  bowel sounds. Extremities:  Without edema. Neurologic:  Alert and oriented,  grossly normal neurologically.  Mentating well, no asterixis Psych:  Cooperative. Normal mood and affect.  Intake/Output from previous day: 06/17 0701 - 06/18 0700 In: 240 [P.O.:240] Out: -  Intake/Output this shift: No intake/output data recorded.  Lab Results: Recent Labs    05/23/24 1429 05/24/24 0447 05/25/24 0324  WBC 5.8 6.0 5.8  HGB 8.4* 8.2* 8.6*  HCT 24.3* 24.7* 24.6*  PLT 51* 48* 57*   BMET Recent Labs    05/24/24 0447 05/24/24 1242 05/25/24 0324  NA 135 135 133*  K 3.9 3.8 4.1  CL 104 105 103  CO2 24 23 21*  GLUCOSE 112* 128* 79  BUN 13 11 10   CREATININE 1.42* 1.41* 1.30*  CALCIUM 8.1* 8.2* 8.1*   LFT Recent Labs    05/25/24 0324  PROT 6.1*  ALBUMIN  1.7*  AST 70*  ALT 26  ALKPHOS 240*  BILITOT 8.7*   PT/INR Recent Labs    05/22/24 1616  LABPROT 28.8*  INR 2.7*        Assessment / Plan:   #51 58 year old white male with decompensated EtOH induced cirrhosis with history of esophageal varices, recent diagnosis of hepatic encephalopathy with admission at [facility], and ascites who presented to the emergency room 3 days ago with complaints of rectal bleeding with small amounts of bright red blood this was in the setting of a diarrheal illness at home which she had had for 2 to 3 days, then began having acute abdominal pain occurring  after abrupt twist trying to get out of bed and had a tearing sensation down into his left inguinal area.  CT with finding of extensive ascites, ventral hernias and a left inguinal hernia.  He had large-volume paracentesis with removal of 4.7 L with improvement in symptoms, still complaining of some pain from the left inguinal hernia  M ELD 3.0=30 on admission  #2 hypotension-limiting ability to do paracenteses and push diuretics Started midodrine  yesterday 5 mg p.o. 3 times daily Blood pressures improved  #3 ascites-currently on Lasix   40 mg p.o. every morning and amiloride  10 mg p.o. every morning  #4 hepatic encephalopathy-needs to be on Xifaxan  550 twice daily and lactulose  30 g twice daily-goal for 3 bowel movements daily  #5 acute kidney injury felt related to large-volume paracentesis and diuretics-improved today with above adjustments  #6 symptomatic left inguinal hernia-high risk for decompensation with surgery would continue to manage conservatively, patient to get athletic support/jockstrap to use on discharge   Plan;   okay for discharge to home today We will arrange for  BMET through our office next week Continue 2 g sodium diet We are arranging for sooner appointment with [facility] hopefully within the next 3 to 4 weeks if and he is already established there Continue abstinence from tobacco and alcohol  Discharge with prescription for Lasix  40 mg p.o. every morning Amiloride  10 mg p.o. every morning Midodrine  5 mg p.o. 3 times daily Xifaxan  550 twice daily needs prescription Lactulose  30 cc twice daily needs prescription Oxycodone  5 mg every 6 hours as needed severe pain-needs prescription.    Principal Problem:   Rectal bleed Active Problems:   Anxiety   MDD (major depressive disorder), recurrent severe, without psychosis (HCC)   GAD (generalized anxiety disorder)   Seizures (HCC)   Posttraumatic stress disorder   Decompensated hepatic cirrhosis (HCC)   Portal hypertensive gastropathy (HCC)   Arterial hypotension     LOS: 0 days   Nathaniel EsterwoodPA-C  05/25/2024, 1:05 PM

## 2024-05-25 NOTE — Plan of Care (Signed)

## 2024-05-25 NOTE — Telephone Encounter (Signed)
 Spoke with [name] at [facility] and no sooner appts available.  Nathaniel Warren will send to triage nurse and see if there is anything they can do to get the pt in sooner and call the pt with that appt.   BMET has been entered and pt advised.

## 2024-05-25 NOTE — Telephone Encounter (Signed)
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 every morning Amiloride  10 mg p.o. every morning and midodrine  5 mg p.o. 3 times daily Xifaxan  550 twice daily Lactulose  30 g twice daily Thank you

## 2024-05-26 NOTE — Telephone Encounter (Signed)
 Appt made to see Nathaniel Warren on 7/25 at 1020 am Pt advised

## 2024-05-26 NOTE — Telephone Encounter (Signed)
 Thank you for working on this [name]. He needs to see one of our APP's or myself in the next 2 to 4 weeks as well as with [facility]. Thanks. GM

## 2024-05-27 ENCOUNTER — Other Ambulatory Visit (HOSPITAL_COMMUNITY): Payer: Self-pay

## 2024-05-27 ENCOUNTER — Telehealth: Payer: Self-pay

## 2024-05-27 NOTE — Transitions of Care (Post Inpatient/ED Visit) (Signed)
 05/27/2024  Name: Nathaniel Warren MRN: [id number] DOB: 30-Apr-1966  Today's TOC FU Call Status: Today's TOC FU Call Status:: Successful TOC FU Call Completed TOC FU Call Complete Date: 05/27/24 Patient's Name and Date of Birth confirmed.  Transition Care Management Follow-up Telephone Call Date of Discharge: 05/25/24 Discharge Facility: [facility]) Type of Discharge: Inpatient Admission Primary Inpatient Discharge Diagnosis:: GI bleed How have you been since you were released from the hospital?: Better Any questions or concerns?: No  Items Reviewed: Did you receive and understand the discharge instructions provided?: Yes Medications obtained,verified, and reconciled?: Yes (Medications Reviewed) Any new allergies since your discharge?: No Dietary orders reviewed?: Yes Do you have support at home?: Yes People in Home [RPT]: friend(s)  Medications Reviewed Today: Medications Reviewed Today     Reviewed by Darrall Ellison, LPN (Licensed Practical Nurse) on 05/27/24 at 1149  Med List Status: <None>   Medication Order Taking? Sig Documenting Provider Last Dose Status Informant  aMILoride  (MIDAMOR ) 5 MG tablet [id number]  Take 2 tablets (10 mg total) by mouth daily.  Patient not taking: Reported on 05/27/2024   Swayze, Ava, DO  Active Self, Pharmacy Records  folic acid  (FOLVITE ) 1 MG tablet [id number] Yes Take 1 tablet (1 mg total) by mouth daily.   Active Self, Pharmacy Records  furosemide  (LASIX ) 40 MG tablet [id number] Yes Take 2 tablets (80 mg total) by mouth daily. Swayze, Ava, DO  Active Self, Pharmacy Records  lactulose  (CHRONULAC ) 10 GM/15ML solution [id number] Yes Take 15 mLs (10 g total) by mouth 2 (two) times daily as needed for mild constipation. Trenton Frock, MD  Active   midodrine  (PROAMATINE ) 5 MG tablet [id number] Yes Take 1 tablet (5 mg total) by mouth 3 (three) times daily with meals. Trenton Frock, MD  Active   nicotine  (NICOTINE  STEP 2) 14 mg/24hr patch  [id number]  Place 1 patch on the skin daily as needed (for nicotine  craving if patient desires).  Patient not taking: Reported on 05/27/2024     Active Self, Pharmacy Records           Med Note Evern Hocking May 22, 2024  6:30 PM) No patch on at this time.  oxyCODONE  (OXY IR/ROXICODONE ) 5 MG immediate release tablet [id number] Yes Take 1-2 tablets (5-10 mg total) by mouth 2 (two) times daily as needed for up to 3 days for moderate pain (pain score 4-6). Trenton Frock, MD  Active   pantoprazole  (PROTONIX ) 40 MG tablet [id number] Yes Take 1 tablet (40 mg total) by mouth daily. Swayze, Ava, DO  Active Self, Pharmacy Records  PARoxetine  (PAXIL ) 10 MG tablet [id number] Yes Take 10 mg by mouth daily. [provider]  Active Self, Pharmacy Records  potassium chloride  SA (KLOR-CON  M) 20 MEQ tablet [id number] Yes Take 2 tablets (40 mEq total) by mouth daily for 1 day, THEN 1 tablet (20 mEq total) daily. Kennedy-Smith, Colleen M, NP  Active   QUEtiapine  (SEROQUEL ) 25 MG tablet [id number] Yes TAKE 1 TABLET BY MOUTH AT BEDTIME NEED  OFFICE  VISIT  Patient taking differently: Take 25 mg by mouth at bedtime as needed (racing thoughts).   Jerrlyn Morel, NP  Active Self, Pharmacy Records           Med Note Guido Leeks, [facility]   Sat Mar 19, 2024  5:19 PM)    rifaximin  (XIFAXAN ) 550 MG TABS tablet [id number] Yes Take 1 tablet (550 mg total) by mouth 2 (two) times  daily.   Active Self, Pharmacy Records     Discontinued 04/11/24 1552 (External Source Cancellation) senna (SENOKOT) 8.6 MG tablet [id number] Yes Take 2 tablets (17.2 mg total) by mouth every evening.   Active Self, Pharmacy Records  thiamine  (VITAMIN B1) 100 MG tablet [id number] Yes Take 1 tablet (100 mg total) by mouth daily.   Active Self, Pharmacy Records            Home Care and Equipment/Supplies: Were Home Health Services Ordered?: NA Any new equipment or medical supplies ordered?: NA  Functional Questionnaire: Do you need  assistance with bathing/showering or dressing?: No Do you need assistance with meal preparation?: No Do you need assistance with eating?: No Do you have difficulty maintaining continence: No Do you need assistance with getting out of bed/getting out of a chair/moving?: No Do you have difficulty managing or taking your medications?: No  Follow up appointments reviewed: PCP Follow-up appointment confirmed?: Yes Date of PCP follow-up appointment?: 06/01/24 Follow-up Provider: [facility] Follow-up appointment confirmed?: Yes Date of Specialist follow-up appointment?: 07/01/24 Follow-Up Specialty Provider:: GI Do you need transportation to your follow-up appointment?: No Do you understand care options if your condition(s) worsen?: Yes-patient verbalized understanding    SIGNATURE Darrall Ellison, LPN [facility] Nurse Health Advisor Direct Dial [phone]

## 2024-06-01 ENCOUNTER — Inpatient Hospital Stay: Payer: Self-pay | Admitting: Nurse Practitioner

## 2024-06-11 ENCOUNTER — Other Ambulatory Visit (HOSPITAL_COMMUNITY): Payer: Self-pay

## 2024-06-13 ENCOUNTER — Telehealth: Payer: Self-pay | Admitting: Nurse Practitioner

## 2024-06-13 ENCOUNTER — Other Ambulatory Visit (INDEPENDENT_AMBULATORY_CARE_PROVIDER_SITE_OTHER)

## 2024-06-13 DIAGNOSIS — K7030 Alcoholic cirrhosis of liver without ascites: Secondary | ICD-10-CM | POA: Diagnosis not present

## 2024-06-13 LAB — BASIC METABOLIC PANEL WITH GFR
BUN: 11 mg/dL (ref 6–23)
CO2: 24 meq/L (ref 19–32)
Calcium: 8.4 mg/dL (ref 8.4–10.5)
Chloride: 101 meq/L (ref 96–112)
Creatinine, Ser: 1.28 mg/dL (ref 0.40–1.50)
GFR: 62.64 mL/min (ref >60.00)
Glucose, Bld: 126 mg/dL — ABNORMAL HIGH (ref 70–99)
Potassium: 3.6 meq/L (ref 3.5–5.1)
Sodium: 132 meq/L — ABNORMAL LOW (ref 135–145)

## 2024-06-13 NOTE — Telephone Encounter (Signed)
 Left message on machine to call back

## 2024-06-13 NOTE — Telephone Encounter (Signed)
 Patient is requesting refills of amiloride , pantoprazole , and xifixan to be sent to [facility] pharmacy.   Patient states that he also is needing another paracentesis.

## 2024-06-14 NOTE — Telephone Encounter (Signed)
 Dr Wilhelmenia do you want to refill the medications the pt is requesting? Also, should we order a paracentesis?  He has an appt with Harlene on 7/25 for follow up.

## 2024-06-16 ENCOUNTER — Other Ambulatory Visit (HOSPITAL_COMMUNITY): Payer: Self-pay

## 2024-06-16 ENCOUNTER — Telehealth (HOSPITAL_COMMUNITY): Payer: Self-pay | Admitting: Pharmacy Technician

## 2024-06-16 ENCOUNTER — Other Ambulatory Visit: Payer: Self-pay

## 2024-06-16 ENCOUNTER — Telehealth (HOSPITAL_COMMUNITY): Payer: Self-pay

## 2024-06-16 ENCOUNTER — Ambulatory Visit: Payer: Self-pay | Admitting: Gastroenterology

## 2024-06-16 DIAGNOSIS — K7030 Alcoholic cirrhosis of liver without ascites: Secondary | ICD-10-CM

## 2024-06-16 MED ORDER — PANTOPRAZOLE SODIUM 40 MG PO TBEC
40.0000 mg | DELAYED_RELEASE_TABLET | Freq: Every day | ORAL | 6 refills | Status: DC
  Filled 2024-06-16: qty 30, 30d supply, fill #0

## 2024-06-16 MED ORDER — RIFAXIMIN 550 MG PO TABS
550.0000 mg | ORAL_TABLET | Freq: Two times a day (BID) | ORAL | 6 refills | Status: DC
  Filled 2024-06-16: qty 60, 30d supply, fill #0

## 2024-06-16 MED ORDER — AMILORIDE HCL 5 MG PO TABS
10.0000 mg | ORAL_TABLET | Freq: Every day | ORAL | 6 refills | Status: DC
  Filled 2024-06-16: qty 30, 15d supply, fill #0

## 2024-06-16 NOTE — Telephone Encounter (Signed)
 The pt has been advised of the prescription refills and order for paracentesis have been ordered and sent to the schedulers.

## 2024-06-16 NOTE — Telephone Encounter (Signed)
 Pharmacy Patient Advocate Encounter   Received notification from Pt Calls Messages that prior authorization for Xifaxan  550MG  tablets  is required/requested.   Insurance verification completed.   The patient is insured through [employer] .   Per test claim: PA required; PA submitted to above mentioned insurance via CoverMyMeds Key/confirmation #/EOC [id number] Status is pending

## 2024-06-16 NOTE — Telephone Encounter (Signed)
 Nathaniel Warren, I reviewed his BMP and it is stable. His amiloride  can be refilled for 6 months. His pantoprazole  can be refilled for 6 months. His Xifaxan  can be refilled for 6 months. If he feels his abdomen has tensed or is more significant he can be scheduled for LVP no more than 5 L removed with 25 mg of albumin  to be administered.  Fluid studies to include cell count/culture/albumin /total protein. Thanks. GM

## 2024-06-16 NOTE — Telephone Encounter (Signed)
 PA request has been Received. New Encounter has been or will be created for follow up. For additional info see Pharmacy Prior Auth telephone encounter from 06/16/24.

## 2024-06-17 NOTE — Telephone Encounter (Signed)
 Pharmacy Patient Advocate Encounter  Received notification from [facility] that Prior Authorization for  Xifaxan  550MG  tablets  has been DENIED.  Full denial letter will be uploaded to the media tab. See denial reason below.   PA #/Case ID/Reference #: [id number]

## 2024-06-17 NOTE — Telephone Encounter (Signed)
 Dr Wilhelmenia please advise next steps

## 2024-06-18 NOTE — Telephone Encounter (Signed)
 However this prior authorization went in it seems like they think that we are treating C. 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 I would make sure that the gnosis codes are correct in regards to the reason we are asking for Xifaxan . If it gets denied again, then we can go through prior authorization approval. Thanks. GM

## 2024-06-20 ENCOUNTER — Other Ambulatory Visit (HOSPITAL_COMMUNITY): Payer: Self-pay

## 2024-06-20 NOTE — Telephone Encounter (Signed)
 Noted the pt has been advised

## 2024-06-20 NOTE — Telephone Encounter (Signed)
 Pharmacy Patient Advocate Encounter  Received notification from [facility] that Prior Authorization for Xifaxan  550MG  tablets has been APPROVED from 06/20/24 to 06/20/25. Ran test claim, Copay is $4. This test claim was processed through [facility]- copay amounts may vary at other pharmacies due to pharmacy/plan contracts, or as the patient moves through the different stages of their insurance plan.   PA #/Case ID/Reference #: [id number]

## 2024-06-20 NOTE — Telephone Encounter (Signed)
 PA resubmitted via CMM. New key: [id number]

## 2024-06-23 ENCOUNTER — Other Ambulatory Visit (HOSPITAL_COMMUNITY): Payer: Self-pay

## 2024-06-24 ENCOUNTER — Ambulatory Visit (HOSPITAL_COMMUNITY)
Admission: RE | Admit: 2024-06-24 | Discharge: 2024-06-24 | Disposition: A | Discharge status: Home / Self Care | Source: Ambulatory Visit | Attending: Gastroenterology | Admitting: Gastroenterology

## 2024-06-24 DIAGNOSIS — K7030 Alcoholic cirrhosis of liver without ascites: Secondary | ICD-10-CM | POA: Insufficient documentation

## 2024-06-24 HISTORY — PX: IR PARACENTESIS: IMG2679

## 2024-06-24 LAB — BODY FLUID CELL COUNT WITH DIFFERENTIAL
Eos, Fluid: 1 %
Lymphs, Fluid: 50 %
Monocyte-Macrophage-Serous Fluid: 47 % — ABNORMAL LOW (ref 50–90)
Neutrophil Count, Fluid: 2 % (ref 0–25)
Total Nucleated Cell Count, Fluid: 15 uL (ref 0–1000)

## 2024-06-24 LAB — PROTEIN, PLEURAL OR PERITONEAL FLUID: Total protein, fluid: <3 g/dL

## 2024-06-24 LAB — GRAM STAIN

## 2024-06-24 LAB — ALBUMIN, PLEURAL OR PERITONEAL FLUID: Albumin, Fluid: <1.5 g/dL

## 2024-06-24 MED ORDER — LIDOCAINE-EPINEPHRINE 1 %-1:100000 IJ SOLN
INTRAMUSCULAR | Status: AC
  Filled 2024-06-24: qty 1

## 2024-06-24 NOTE — Procedures (Signed)
 PROCEDURE SUMMARY:  Successful ultrasound guided paracentesis from the right lower quadrant.  Yielded 5 L of clear yellow fluid.  No immediate complications.  The patient tolerated the procedure well.   Specimen sent for labs.  EBL < 2 mL  If the patient eventually requires >/=2 paracenteses in a 30 day period, screening evaluation by the [facility] will be assessed.  ** Patient was undergoing work up for liver transplant but is not a candidate due to continued tobacco use. Patient's last paracentesis was a little over one month ago.    Jaysun Wessels, AGACNP-BC 06/24/2024, 2:44 PM

## 2024-06-26 ENCOUNTER — Ambulatory Visit: Payer: Self-pay | Admitting: Gastroenterology

## 2024-06-27 LAB — PATHOLOGIST SMEAR REVIEW

## 2024-06-29 LAB — CULTURE, BODY FLUID W GRAM STAIN -BOTTLE: Culture: NO GROWTH

## 2024-06-30 ENCOUNTER — Ambulatory Visit (HOSPITAL_COMMUNITY): Admitting: Mental Health

## 2024-06-30 ENCOUNTER — Inpatient Hospital Stay: Payer: Self-pay | Admitting: Nurse Practitioner

## 2024-06-30 ENCOUNTER — Encounter (HOSPITAL_COMMUNITY): Payer: Self-pay

## 2024-06-30 NOTE — Progress Notes (Signed)
 Erroneous encounter

## 2024-07-01 ENCOUNTER — Ambulatory Visit: Admitting: Gastroenterology

## 2024-07-01 ENCOUNTER — Other Ambulatory Visit: Payer: Self-pay

## 2024-07-01 ENCOUNTER — Encounter: Payer: Self-pay | Admitting: Gastroenterology

## 2024-07-01 ENCOUNTER — Other Ambulatory Visit

## 2024-07-01 ENCOUNTER — Ambulatory Visit: Payer: Self-pay | Admitting: Gastroenterology

## 2024-07-01 ENCOUNTER — Other Ambulatory Visit (HOSPITAL_COMMUNITY): Payer: Self-pay

## 2024-07-01 ENCOUNTER — Other Ambulatory Visit: Payer: Self-pay | Admitting: *Deleted

## 2024-07-01 VITALS — BP 118/62 | HR 71 | Ht 69.0 in | Wt 200.0 lb

## 2024-07-01 DIAGNOSIS — R17 Unspecified jaundice: Secondary | ICD-10-CM

## 2024-07-01 DIAGNOSIS — K7031 Alcoholic cirrhosis of liver with ascites: Secondary | ICD-10-CM

## 2024-07-01 LAB — CBC WITH DIFFERENTIAL/PLATELET
Basophils Absolute: 0 K/uL (ref 0.0–0.1)
Basophils Relative: 0.7 % (ref 0.0–3.0)
Eosinophils Absolute: 0.3 K/uL (ref 0.0–0.7)
Eosinophils Relative: 4.5 % (ref 0.0–5.0)
HCT: 28 % — ABNORMAL LOW (ref 39.0–52.0)
Hemoglobin: 9.9 g/dL — ABNORMAL LOW (ref 13.0–17.0)
Lymphocytes Relative: 32.5 % (ref 12.0–46.0)
Lymphs Abs: 2 K/uL (ref 0.7–4.0)
MCHC: 35.3 g/dL (ref 30.0–36.0)
MCV: 115.7 fl — ABNORMAL HIGH (ref 78.0–100.0)
Monocytes Absolute: 0.9 K/uL (ref 0.1–1.0)
Monocytes Relative: 14.8 % — ABNORMAL HIGH (ref 3.0–12.0)
Neutro Abs: 3 K/uL (ref 1.4–7.7)
Neutrophils Relative %: 47.5 % (ref 43.0–77.0)
Platelets: 71 K/uL — ABNORMAL LOW (ref 150.0–400.0)
RBC: 2.42 Mil/uL — ABNORMAL LOW (ref 4.22–5.81)
RDW: 17.8 % — ABNORMAL HIGH (ref 11.5–15.5)
WBC: 6.2 K/uL (ref 4.0–10.5)

## 2024-07-01 LAB — COMPREHENSIVE METABOLIC PANEL WITH GFR
ALT: 24 U/L (ref 0–53)
AST: 67 U/L — ABNORMAL HIGH (ref 0–37)
Albumin: 2.5 g/dL — ABNORMAL LOW (ref 3.5–5.2)
Alkaline Phosphatase: 302 U/L — ABNORMAL HIGH (ref 39–117)
BUN: 11 mg/dL (ref 6–23)
CO2: 28 meq/L (ref 19–32)
Calcium: 8.7 mg/dL (ref 8.4–10.5)
Chloride: 98 meq/L (ref 96–112)
Creatinine, Ser: 1.42 mg/dL (ref 0.40–1.50)
GFR: 55.28 mL/min — ABNORMAL LOW (ref >60.00)
Glucose, Bld: 106 mg/dL — ABNORMAL HIGH (ref 70–99)
Potassium: 3.6 meq/L (ref 3.5–5.1)
Sodium: 134 meq/L — ABNORMAL LOW (ref 135–145)
Total Bilirubin: 15.3 mg/dL — ABNORMAL HIGH (ref 0.2–1.2)
Total Protein: 7.2 g/dL (ref 6.0–8.3)

## 2024-07-01 LAB — PROTIME-INR
INR: 2.5 ratio — ABNORMAL HIGH (ref 0.8–1.0)
Prothrombin Time: 25 s — ABNORMAL HIGH (ref 9.6–13.1)

## 2024-07-01 MED ORDER — AMILORIDE HCL 5 MG PO TABS
10.0000 mg | ORAL_TABLET | Freq: Every day | ORAL | 1 refills | Status: DC
  Filled 2024-07-01: qty 60, 30d supply, fill #0

## 2024-07-01 MED ORDER — PANTOPRAZOLE SODIUM 40 MG PO TBEC
40.0000 mg | DELAYED_RELEASE_TABLET | Freq: Every day | ORAL | 6 refills | Status: DC
  Filled 2024-07-01: qty 30, 30d supply, fill #0

## 2024-07-01 NOTE — Patient Instructions (Addendum)
 We have sent the following medications to your pharmacy for you to pick up at your convenience: Amilaride and Pantoprazole    Your provider has requested that you go to the basement level for lab work before leaving today. Press B on the elevator. The lab is located at the first door on the left as you exit the elevator.  _______________________________________________________  If your blood pressure at your visit was 140/90 or greater, please contact your primary care physician to follow up on this.  _______________________________________________________  If you are age 56 or older, your body mass index should be between 23-30. Your Body mass index is 29.53 kg/m. If this is out of the aforementioned range listed, please consider follow up with your Primary Care Provider.  If you are age 15 or younger, your body mass index should be between 19-25. Your Body mass index is 29.53 kg/m. If this is out of the aformentioned range listed, please consider follow up with your Primary Care Provider.   ________________________________________________________  The [facility] providers would like to encourage you to use MYCHART to communicate with providers for non-urgent requests or questions.  Due to long hold times on the telephone, sending your provider a message by [facility] may be a faster and more efficient way to get a response.  Please allow 48 business hours for a response.  Please remember that this is for non-urgent requests.  _______________________________________________________  [facility] is using a team-based approach to care.  Your team is made up of your doctor and two to three APPS. Our APPS (Nurse Practitioners and Physician Assistants) work with your physician to ensure care continuity for you. They are fully qualified to address your health concerns and develop a treatment plan. They communicate directly with your gastroenterologist to care for you. Seeing the Advanced  Practice Practitioners on your physician's team can help you by facilitating care more promptly, often allowing for earlier appointments, access to diagnostic testing, procedures, and other specialty referrals.

## 2024-07-01 NOTE — Progress Notes (Signed)
 07/01/2024 Nathaniel Warren [id number] 11-25-1966   HISTORY OF PRESENT ILLNESS: This is a 58 year old male with past medical history of alcohol  related cirrhosis with ascites, esophageal varices, encephalopathy.  He already follows with [facility].  Has an appointment with them on 8/4.  Hospitalized in June for abdominal pain and was seen by our team.  CT scan abdomen pelvis with contrast on 05/22/2024:  IMPRESSION: 1. Large ascites and anasarca. 2. 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 3. Cholelithiasis. 4. Small left pleural effusion and trace right pleural effusion with associated atelectasis. 5. Diverticulosis without diverticulitis. 6. Aortic atherosclerosis.  Blood pressures were running low, had some increase in his creatinine.  Was started on midodrine  5 mg p.o. 3 times daily.  Also continue on Lasix  40 mg daily and was prescribed amiloride  10 mg daily.  Recently had a hospitalization at [facility] for hepatic encephalopathy.  Is on Xifaxan  550 mg twice daily and lactulose .  Feels okay currently.  Abdomen has a lot of fluid again, but does not feel need for paracentesis to be scheduled at this moment.  Is here today with his daughter and 2 granddaughters for follow-up.  Past Medical History:  Diagnosis Date   Brain bleed (HCC)    Cirrhosis (HCC)    Depression    Drug overdose 10/20/2017   Drug overdose, intentional self-harm, initial encounter (HCC) 10/20/2017   Drug overdose, intentional, initial encounter (HCC) 10/20/2017   Hernia, abdominal    Liver failure (HCC)    Seizures (HCC)    Stroke (HCC)    Tobacco abuse    Past Surgical History:  Procedure Laterality Date   ANKLE SURGERY     ANKLE SURGERY Right    ESOPHAGOGASTRODUODENOSCOPY Left 03/20/2024   Procedure: EGD (ESOPHAGOGASTRODUODENOSCOPY);  Surgeon: San Sandor GAILS, DO;  Location: [facility];  Service: Gastroenterology;   Laterality: Left;   HERNIA REPAIR     INCISION AND DRAINAGE OF WOUND Left 02/03/2023   Procedure: IRRIGATION AND DEBRIDEMENT WOUND LEFT RING FINGER POSSIBLE AMPUTATION;  Surgeon: Alyse Lynwood, MD;  Location: [facility];  Service: Orthopedics;  Laterality: Left;   IR PARACENTESIS  11/12/2023   IR PARACENTESIS  12/01/2023   IR PARACENTESIS  02/29/2024   IR PARACENTESIS  05/23/2024   IR PARACENTESIS  06/24/2024   SPLENECTOMY      reports that he has been smoking cigarettes. He has a 30 pack-year smoking history. He has quit using smokeless tobacco. He reports that he does not currently use alcohol . He reports current drug use. Drug: Marijuana. family history includes Alzheimer's disease in his maternal grandfather; COPD in his mother; Cancer in his paternal grandmother; Diabetes in his father; Hypertension in an other family member; Stomach cancer in his paternal grandfather; Stroke in his maternal grandmother. Allergies  Allergen Reactions   Nsaids Other (See Comments)    Cannot take due to liver condition      Outpatient Encounter Medications as of 07/01/2024  Medication Sig   folic acid  (FOLVITE ) 1 MG tablet Take 1 tablet (1 mg total) by mouth daily.   furosemide  (LASIX ) 40 MG tablet Take 2 tablets (80 mg total) by mouth daily.   lactulose  (CHRONULAC ) 10 GM/15ML solution Take 15 mLs (10 g total) by mouth 2 (two) times daily as needed for mild constipation.   midodrine  (PROAMATINE ) 5 MG tablet Take 1 tablet (5 mg total) by mouth 3 (three) times daily with meals.   nicotine  (  NICOTINE  STEP 2) 14 mg/24hr patch Place 1 patch on the skin daily as needed (for nicotine  craving if patient desires).   PARoxetine  (PAXIL ) 10 MG tablet Take 10 mg by mouth daily.   QUEtiapine  (SEROQUEL ) 25 MG tablet TAKE 1 TABLET BY MOUTH AT BEDTIME NEED  OFFICE  VISIT (Patient taking differently: Take 25 mg by mouth at bedtime as needed (racing thoughts).)   rifaximin  (XIFAXAN ) 550 MG TABS tablet Take 1 tablet (550 mg total)  by mouth 2 (two) times daily.   senna (SENOKOT) 8.6 MG tablet Take 2 tablets (17.2 mg total) by mouth every evening.   thiamine  (VITAMIN B1) 100 MG tablet Take 1 tablet (100 mg total) by mouth daily.   [DISCONTINUED] aMILoride  (MIDAMOR ) 5 MG tablet Take 2 tablets (10 mg total) by mouth daily.   [DISCONTINUED] pantoprazole  (PROTONIX ) 40 MG tablet Take 1 tablet (40 mg total) by mouth daily.   aMILoride  (MIDAMOR ) 5 MG tablet Take 2 tablets (10 mg total) by mouth daily.   pantoprazole  (PROTONIX ) 40 MG tablet Take 1 tablet (40 mg total) by mouth daily.   potassium chloride  SA (KLOR-CON  M) 20 MEQ tablet Take 2 tablets (40 mEq total) by mouth daily for 1 day, THEN 1 tablet (20 mEq total) daily. (Patient not taking: No sig reported)   [DISCONTINUED] rivaroxaban  (XARELTO ) 20 MG TABS tablet Take 1 tablet (20 mg total) by mouth daily.   No facility-administered encounter medications on file as of 07/01/2024.    REVIEW OF SYSTEMS  : All other systems reviewed and negative except where noted in the History of Present Illness.   PHYSICAL EXAM: BP 118/62   Pulse 71   Ht 5' 9 (1.753 m)   Wt 200 lb (90.7 kg)   BMI 29.53 kg/m  General: Chronically ill-appearing in no acute distress, appears older than stated age, deeply jaundiced. Head: Normocephalic and atraumatic Eyes:  Scleral icterus noted. Ears: Normal auditory acuity Lungs: Clear throughout to auscultation; no W/R/R. Heart: Regular rate and rhythm; no M/R/G. Abdomen: Softly distended with ascites fluid.  BS present.  Non-tender. Musculoskeletal: Symmetrical with no gross deformities  Skin: No lesions on visible extremities Extremities:  At least 2+ pitting edema noted in B/L LEs R>L Neurological: Alert oriented x 4, grossly non-focal Psychological:  Alert and cooperative. Normal mood and affect  ASSESSMENT AND PLAN: #29 58 year old white male with decompensated EtOH induced cirrhosis with history of esophageal varices, recent diagnosis of  hepatic encephalopathy with admission at [facility], and ascites   CT with finding of extensive ascites, ventral hernias and a left inguinal hernia.   Had a 5 L paracentesis performed last week.   MELD 3.0=30 on admission   #2 hypotension-limiting ability to do paracenteses and push diuretics On midodrine  5 mg p.o. 3 times daily.  BP is good today.   #3 ascites-currently on Lasix  40 mg p.o. every morning and amiloride  10 mg p.o. every morning   #4 hepatic encephalopathy-needs to be on Xifaxan  550 twice daily and lactulose  30 g twice daily-goal for 3 bowel movements daily   #5 acute kidney injury felt related to large-volume paracentesis and diuretics-improved with adjustments   #6 symptomatic left inguinal hernia-high risk for decompensation with surgery would continue to manage conservatively, patient to get athletic support/jockstrap to use on discharge   - Continue Lasix  40 mg daily and amiloride  5 mg daily.  Prescription from the amiloride  was sent to his pharmacy. - Continue pantoprazole  40 mg daily.  Prescription sent to pharmacy. -  Continue Xifaxan  550 mg twice daily and lactulose  twice daily to be titrated to about 3 bowel movements daily. - Will check labs again today including CBC, CMP, PT/INR to update MELD score. - Has an appointment with [facility] on 8/4. - 2 g sodium diet. - Continue midodrine  5 mg p.o. 3 times daily. - Will place an order for paracentesis with max of 5 L removed for him to receive albumin  and check fluid for cell count and culture.  He said he will call and schedule this if needed next week before he is seen at the liver clinic.   CC:  Oley Bascom RAMAN, NP

## 2024-07-04 ENCOUNTER — Telehealth: Payer: Self-pay

## 2024-07-04 ENCOUNTER — Other Ambulatory Visit (HOSPITAL_COMMUNITY): Payer: Self-pay

## 2024-07-04 NOTE — Telephone Encounter (Signed)
-----   Message from Trenton D. Zehr sent at 07/01/2024  5:16 PM EDT ----- I saw this patient on Friday.  Heather reached out to him Friday afternoon to confirm the dose of Lasix  that he was taking, but he was not sure.  He should only be taking Lasix  40 mg daily and she advised him to only be taking 40 mg daily, but his paperwork said to take 2 daily so want to be sure that he is not taking a total of 80 mg daily.  I want to confirm what he was actually taking last week when I saw him.  Can we please see if he can verify this for somebody?  Thank you,  Jess

## 2024-07-04 NOTE — Telephone Encounter (Signed)
 I have left Nathaniel Warren a detailed message to call us  back so we can confirm his lasix  dosage.

## 2024-07-04 NOTE — Progress Notes (Signed)
 Attending Physician's Attestation   I have reviewed the chart.   I agree with the Advanced Practitioner's note, impression, and recommendations with any updates as below. Should laboratories look appropriate, then we can consider potential up titration of diuretics.  Can allow liver team with upcoming clinic appointment, to make that adjustment if necessary. High risk for decompensation in setting of potential TIPS with his MELD, so would not proceed with that at this time.   Aloha Finner, MD [facility] Office # [phone]

## 2024-07-06 ENCOUNTER — Other Ambulatory Visit: Payer: Self-pay

## 2024-07-06 ENCOUNTER — Inpatient Hospital Stay (HOSPITAL_COMMUNITY)
Admission: EM | Admit: 2024-07-06 | Discharge: 2024-08-08 | DRG: 432 | Disposition: E | Attending: Internal Medicine | Admitting: Internal Medicine

## 2024-07-06 ENCOUNTER — Inpatient Hospital Stay (HOSPITAL_COMMUNITY)

## 2024-07-06 ENCOUNTER — Ambulatory Visit (HOSPITAL_COMMUNITY): Admitting: Mental Health

## 2024-07-06 ENCOUNTER — Emergency Department (HOSPITAL_COMMUNITY)

## 2024-07-06 ENCOUNTER — Encounter (HOSPITAL_COMMUNITY): Payer: Self-pay

## 2024-07-06 ENCOUNTER — Other Ambulatory Visit (HOSPITAL_BASED_OUTPATIENT_CLINIC_OR_DEPARTMENT_OTHER)

## 2024-07-06 ENCOUNTER — Ambulatory Visit (HOSPITAL_BASED_OUTPATIENT_CLINIC_OR_DEPARTMENT_OTHER): Admission: RE | Admit: 2024-07-06 | Source: Ambulatory Visit

## 2024-07-06 DIAGNOSIS — Z8249 Family history of ischemic heart disease and other diseases of the circulatory system: Secondary | ICD-10-CM | POA: Diagnosis not present

## 2024-07-06 DIAGNOSIS — R197 Diarrhea, unspecified: Secondary | ICD-10-CM | POA: Diagnosis present

## 2024-07-06 DIAGNOSIS — W19XXXA Unspecified fall, initial encounter: Secondary | ICD-10-CM | POA: Diagnosis present

## 2024-07-06 DIAGNOSIS — K7210 Chronic hepatic failure without coma: Secondary | ICD-10-CM | POA: Diagnosis not present

## 2024-07-06 DIAGNOSIS — D62 Acute posthemorrhagic anemia: Secondary | ICD-10-CM | POA: Diagnosis present

## 2024-07-06 DIAGNOSIS — D696 Thrombocytopenia, unspecified: Secondary | ICD-10-CM | POA: Diagnosis not present

## 2024-07-06 DIAGNOSIS — G9341 Metabolic encephalopathy: Secondary | ICD-10-CM | POA: Diagnosis present

## 2024-07-06 DIAGNOSIS — K921 Melena: Secondary | ICD-10-CM | POA: Diagnosis not present

## 2024-07-06 DIAGNOSIS — Z823 Family history of stroke: Secondary | ICD-10-CM

## 2024-07-06 DIAGNOSIS — F17210 Nicotine dependence, cigarettes, uncomplicated: Secondary | ICD-10-CM | POA: Diagnosis present

## 2024-07-06 DIAGNOSIS — K7031 Alcoholic cirrhosis of liver with ascites: Secondary | ICD-10-CM | POA: Diagnosis not present

## 2024-07-06 DIAGNOSIS — K766 Portal hypertension: Secondary | ICD-10-CM | POA: Diagnosis present

## 2024-07-06 DIAGNOSIS — Z86718 Personal history of other venous thrombosis and embolism: Secondary | ICD-10-CM

## 2024-07-06 DIAGNOSIS — S9002XA Contusion of left ankle, initial encounter: Secondary | ICD-10-CM | POA: Diagnosis present

## 2024-07-06 DIAGNOSIS — Z9081 Acquired absence of spleen: Secondary | ICD-10-CM

## 2024-07-06 DIAGNOSIS — Z820 Family history of epilepsy and other diseases of the nervous system: Secondary | ICD-10-CM

## 2024-07-06 DIAGNOSIS — Z7901 Long term (current) use of anticoagulants: Secondary | ICD-10-CM | POA: Diagnosis not present

## 2024-07-06 DIAGNOSIS — N186 End stage renal disease: Secondary | ICD-10-CM | POA: Diagnosis not present

## 2024-07-06 DIAGNOSIS — K922 Gastrointestinal hemorrhage, unspecified: Secondary | ICD-10-CM | POA: Diagnosis present

## 2024-07-06 DIAGNOSIS — R569 Unspecified convulsions: Secondary | ICD-10-CM

## 2024-07-06 DIAGNOSIS — K3189 Other diseases of stomach and duodenum: Secondary | ICD-10-CM | POA: Diagnosis present

## 2024-07-06 DIAGNOSIS — K7040 Alcoholic hepatic failure without coma: Principal | ICD-10-CM | POA: Diagnosis present

## 2024-07-06 DIAGNOSIS — D684 Acquired coagulation factor deficiency: Secondary | ICD-10-CM | POA: Diagnosis present

## 2024-07-06 DIAGNOSIS — Z8673 Personal history of transient ischemic attack (TIA), and cerebral infarction without residual deficits: Secondary | ICD-10-CM

## 2024-07-06 DIAGNOSIS — K7460 Unspecified cirrhosis of liver: Secondary | ICD-10-CM | POA: Diagnosis not present

## 2024-07-06 DIAGNOSIS — I679 Cerebrovascular disease, unspecified: Secondary | ICD-10-CM | POA: Diagnosis not present

## 2024-07-06 DIAGNOSIS — Z800 Family history of malignant neoplasm of digestive organs: Secondary | ICD-10-CM

## 2024-07-06 DIAGNOSIS — Z888 Allergy status to other drugs, medicaments and biological substances status: Secondary | ICD-10-CM

## 2024-07-06 DIAGNOSIS — K4090 Unilateral inguinal hernia, without obstruction or gangrene, not specified as recurrent: Secondary | ICD-10-CM | POA: Diagnosis present

## 2024-07-06 DIAGNOSIS — Z515 Encounter for palliative care: Secondary | ICD-10-CM

## 2024-07-06 DIAGNOSIS — I8500 Esophageal varices without bleeding: Secondary | ICD-10-CM | POA: Diagnosis not present

## 2024-07-06 DIAGNOSIS — R32 Unspecified urinary incontinence: Secondary | ICD-10-CM | POA: Diagnosis present

## 2024-07-06 DIAGNOSIS — K7030 Alcoholic cirrhosis of liver without ascites: Secondary | ICD-10-CM | POA: Diagnosis present

## 2024-07-06 DIAGNOSIS — G40909 Epilepsy, unspecified, not intractable, without status epilepticus: Secondary | ICD-10-CM | POA: Diagnosis present

## 2024-07-06 DIAGNOSIS — Z66 Do not resuscitate: Secondary | ICD-10-CM | POA: Diagnosis not present

## 2024-07-06 DIAGNOSIS — R601 Generalized edema: Secondary | ICD-10-CM | POA: Diagnosis present

## 2024-07-06 DIAGNOSIS — N179 Acute kidney failure, unspecified: Secondary | ICD-10-CM | POA: Diagnosis present

## 2024-07-06 DIAGNOSIS — F319 Bipolar disorder, unspecified: Secondary | ICD-10-CM | POA: Diagnosis present

## 2024-07-06 DIAGNOSIS — R159 Full incontinence of feces: Secondary | ICD-10-CM | POA: Diagnosis present

## 2024-07-06 DIAGNOSIS — Z634 Disappearance and death of family member: Secondary | ICD-10-CM

## 2024-07-06 DIAGNOSIS — D6959 Other secondary thrombocytopenia: Secondary | ICD-10-CM | POA: Diagnosis present

## 2024-07-06 DIAGNOSIS — F1090 Alcohol use, unspecified, uncomplicated: Secondary | ICD-10-CM | POA: Diagnosis present

## 2024-07-06 DIAGNOSIS — K767 Hepatorenal syndrome: Secondary | ICD-10-CM | POA: Diagnosis present

## 2024-07-06 DIAGNOSIS — I8510 Secondary esophageal varices without bleeding: Secondary | ICD-10-CM | POA: Diagnosis present

## 2024-07-06 DIAGNOSIS — F1091 Alcohol use, unspecified, in remission: Secondary | ICD-10-CM | POA: Diagnosis present

## 2024-07-06 DIAGNOSIS — R011 Cardiac murmur, unspecified: Secondary | ICD-10-CM | POA: Diagnosis present

## 2024-07-06 DIAGNOSIS — Z825 Family history of asthma and other chronic lower respiratory diseases: Secondary | ICD-10-CM | POA: Diagnosis not present

## 2024-07-06 DIAGNOSIS — F419 Anxiety disorder, unspecified: Secondary | ICD-10-CM | POA: Diagnosis present

## 2024-07-06 DIAGNOSIS — R5381 Other malaise: Secondary | ICD-10-CM | POA: Diagnosis present

## 2024-07-06 DIAGNOSIS — Z79899 Other long term (current) drug therapy: Secondary | ICD-10-CM

## 2024-07-06 DIAGNOSIS — K7682 Hepatic encephalopathy: Secondary | ICD-10-CM | POA: Diagnosis present

## 2024-07-06 DIAGNOSIS — R1030 Lower abdominal pain, unspecified: Secondary | ICD-10-CM | POA: Diagnosis present

## 2024-07-06 DIAGNOSIS — F332 Major depressive disorder, recurrent severe without psychotic features: Secondary | ICD-10-CM | POA: Diagnosis present

## 2024-07-06 DIAGNOSIS — Z9151 Personal history of suicidal behavior: Secondary | ICD-10-CM

## 2024-07-06 DIAGNOSIS — Z604 Social exclusion and rejection: Secondary | ICD-10-CM | POA: Diagnosis present

## 2024-07-06 DIAGNOSIS — R531 Weakness: Secondary | ICD-10-CM | POA: Diagnosis present

## 2024-07-06 DIAGNOSIS — R5383 Other fatigue: Secondary | ICD-10-CM | POA: Diagnosis present

## 2024-07-06 DIAGNOSIS — Z5986 Financial insecurity: Secondary | ICD-10-CM

## 2024-07-06 DIAGNOSIS — Z833 Family history of diabetes mellitus: Secondary | ICD-10-CM

## 2024-07-06 LAB — I-STAT CHEM 8, ED
BUN: 39 mg/dL — ABNORMAL HIGH (ref 6–20)
Calcium, Ion: 0.96 mmol/L — ABNORMAL LOW (ref 1.15–1.40)
Chloride: 98 mmol/L (ref 98–111)
Creatinine, Ser: 2.4 mg/dL — ABNORMAL HIGH (ref 0.61–1.24)
Glucose, Bld: 78 mg/dL (ref 70–99)
HCT: 24 % — ABNORMAL LOW (ref 39.0–52.0)
Hemoglobin: 8.2 g/dL — ABNORMAL LOW (ref 13.0–17.0)
Potassium: 4 mmol/L (ref 3.5–5.1)
Sodium: 134 mmol/L — ABNORMAL LOW (ref 135–145)
TCO2: 23 mmol/L (ref 22–32)

## 2024-07-06 LAB — COMPREHENSIVE METABOLIC PANEL WITH GFR
ALT: 31 U/L (ref 0–44)
AST: 94 U/L — ABNORMAL HIGH (ref 15–41)
Albumin: 1.6 g/dL — ABNORMAL LOW (ref 3.5–5.0)
Alkaline Phosphatase: 163 U/L — ABNORMAL HIGH (ref 38–126)
Anion gap: 14 (ref 5–15)
BUN: 41 mg/dL — ABNORMAL HIGH (ref 6–20)
CO2: 21 mmol/L — ABNORMAL LOW (ref 22–32)
Calcium: 8.3 mg/dL — ABNORMAL LOW (ref 8.9–10.3)
Chloride: 98 mmol/L (ref 98–111)
Creatinine, Ser: 2.35 mg/dL — ABNORMAL HIGH (ref 0.61–1.24)
GFR, Estimated: 32 mL/min — ABNORMAL LOW (ref >60)
Glucose, Bld: 86 mg/dL (ref 70–99)
Potassium: 3.7 mmol/L (ref 3.5–5.1)
Sodium: 133 mmol/L — ABNORMAL LOW (ref 135–145)
Total Bilirubin: 19.8 mg/dL (ref 0.0–1.2)
Total Protein: 6.7 g/dL (ref 6.5–8.1)

## 2024-07-06 LAB — CBC
HCT: 22.8 % — ABNORMAL LOW (ref 39.0–52.0)
Hemoglobin: 7.6 g/dL — ABNORMAL LOW (ref 13.0–17.0)
MCH: 39.6 pg — ABNORMAL HIGH (ref 26.0–34.0)
MCHC: 33.3 g/dL (ref 30.0–36.0)
MCV: 118.8 fL — ABNORMAL HIGH (ref 80.0–100.0)
Platelets: 55 K/uL — ABNORMAL LOW (ref 150–400)
RBC: 1.92 MIL/uL — ABNORMAL LOW (ref 4.22–5.81)
RDW: 19.9 % — ABNORMAL HIGH (ref 11.5–15.5)
WBC: 13 K/uL — ABNORMAL HIGH (ref 4.0–10.5)
nRBC: 0.4 % — ABNORMAL HIGH (ref 0.0–0.2)

## 2024-07-06 LAB — PROTEIN, PLEURAL OR PERITONEAL FLUID: Total protein, fluid: <3 g/dL

## 2024-07-06 LAB — BODY FLUID CELL COUNT WITH DIFFERENTIAL
Eos, Fluid: 0 %
Lymphs, Fluid: 12 %
Monocyte-Macrophage-Serous Fluid: 60 % (ref 50–90)
Neutrophil Count, Fluid: 28 % — ABNORMAL HIGH (ref 0–25)
Total Nucleated Cell Count, Fluid: 70 uL (ref 0–1000)

## 2024-07-06 LAB — ALBUMIN, PLEURAL OR PERITONEAL FLUID: Albumin, Fluid: <1.5 g/dL

## 2024-07-06 LAB — AMMONIA: Ammonia: 27 umol/L (ref 9–35)

## 2024-07-06 LAB — I-STAT CG4 LACTIC ACID, ED: Lactic Acid, Venous: 3.5 mmol/L (ref 0.5–1.9)

## 2024-07-06 LAB — PREPARE RBC (CROSSMATCH)

## 2024-07-06 LAB — CBG MONITORING, ED: Glucose-Capillary: 87 mg/dL (ref 70–99)

## 2024-07-06 LAB — LIPASE, BLOOD: Lipase: 34 U/L (ref 11–51)

## 2024-07-06 LAB — POC OCCULT BLOOD, ED: Fecal Occult Bld: POSITIVE — AB

## 2024-07-06 MED ORDER — LACTULOSE 10 GM/15ML PO SOLN: 10.0000 g | Freq: Two times a day (BID) | ORAL | Status: DC | PRN

## 2024-07-06 MED ORDER — MIDODRINE HCL 5 MG PO TABS
10.0000 mg | ORAL_TABLET | Freq: Three times a day (TID) | ORAL | Status: DC
  Administered 2024-07-06 – 2024-07-07 (×3): 10 mg via ORAL
  Filled 2024-07-06 (×3): qty 2

## 2024-07-06 MED ORDER — OCTREOTIDE LOAD VIA INFUSION
25.0000 ug | Freq: Once | INTRAVENOUS | Status: AC
  Administered 2024-07-06: 25 ug via INTRAVENOUS
  Filled 2024-07-06: qty 13

## 2024-07-06 MED ORDER — ALBUMIN HUMAN 25 % IV SOLN
50.0000 g | Freq: Three times a day (TID) | INTRAVENOUS | Status: DC
  Administered 2024-07-06 – 2024-07-07 (×3): 50 g via INTRAVENOUS
  Administered 2024-07-07: 25 g via INTRAVENOUS
  Filled 2024-07-06 (×4): qty 200

## 2024-07-06 MED ORDER — RIFAXIMIN 550 MG PO TABS
550.0000 mg | ORAL_TABLET | Freq: Two times a day (BID) | ORAL | Status: DC
  Administered 2024-07-06 – 2024-07-07 (×3): 550 mg via ORAL
  Filled 2024-07-06 (×3): qty 1

## 2024-07-06 MED ORDER — SODIUM CHLORIDE 0.9 % IV SOLN
250.0000 mL | INTRAVENOUS | Status: AC | PRN
  Administered 2024-07-07: 250 mL via INTRAVENOUS

## 2024-07-06 MED ORDER — SODIUM CHLORIDE 0.9 % IV SOLN
2.0000 g | INTRAVENOUS | Status: DC
  Administered 2024-07-07: 2 g via INTRAVENOUS
  Filled 2024-07-06: qty 20

## 2024-07-06 MED ORDER — SODIUM CHLORIDE 0.9 % IV SOLN: 1.0000 g | INTRAVENOUS | Status: DC

## 2024-07-06 MED ORDER — SODIUM CHLORIDE 0.9% FLUSH
3.0000 mL | Freq: Two times a day (BID) | INTRAVENOUS | Status: DC
  Administered 2024-07-07 (×2): 3 mL via INTRAVENOUS

## 2024-07-06 MED ORDER — SODIUM CHLORIDE 0.9% FLUSH: 3.0000 mL | INTRAVENOUS | Status: DC | PRN

## 2024-07-06 MED ORDER — VITAMIN K1 10 MG/ML IJ SOLN
5.0000 mg | Freq: Every day | INTRAVENOUS | Status: DC
  Administered 2024-07-06 – 2024-07-07 (×2): 5 mg via INTRAVENOUS
  Filled 2024-07-06 (×3): qty 0.5

## 2024-07-06 MED ORDER — SODIUM CHLORIDE 0.9 % IV SOLN: Freq: Once | INTRAVENOUS | Status: AC

## 2024-07-06 MED ORDER — ALBUMIN HUMAN 25 % IV SOLN
12.5000 g | Freq: Once | INTRAVENOUS | Status: DC
Start: 2024-07-06 — End: 2024-07-06

## 2024-07-06 MED ORDER — SODIUM CHLORIDE 0.9 % IV SOLN
1.0000 g | Freq: Once | INTRAVENOUS | Status: AC
  Administered 2024-07-06: 1 g via INTRAVENOUS
  Filled 2024-07-06: qty 10

## 2024-07-06 MED ORDER — ACETAMINOPHEN 325 MG PO TABS
650.0000 mg | ORAL_TABLET | Freq: Four times a day (QID) | ORAL | Status: DC | PRN
  Administered 2024-07-07: 650 mg via ORAL
  Filled 2024-07-06: qty 2

## 2024-07-06 MED ORDER — ACETAMINOPHEN 650 MG RE SUPP: 650.0000 mg | Freq: Four times a day (QID) | RECTAL | Status: DC | PRN

## 2024-07-06 MED ORDER — ADULT MULTIVITAMIN W/MINERALS CH
1.0000 | ORAL_TABLET | Freq: Every day | ORAL | Status: DC
  Administered 2024-07-06 – 2024-07-07 (×2): 1 via ORAL
  Filled 2024-07-06 (×3): qty 1

## 2024-07-06 MED ORDER — SODIUM CHLORIDE 0.9 % IV SOLN
50.0000 ug/h | INTRAVENOUS | Status: DC
  Administered 2024-07-06 – 2024-07-08 (×4): 50 ug/h via INTRAVENOUS
  Filled 2024-07-06 (×5): qty 1

## 2024-07-06 MED ORDER — THIAMINE MONONITRATE 100 MG PO TABS
100.0000 mg | ORAL_TABLET | Freq: Every day | ORAL | Status: DC
  Administered 2024-07-06 – 2024-07-07 (×2): 100 mg via ORAL
  Filled 2024-07-06 (×2): qty 1

## 2024-07-06 MED ORDER — FOLIC ACID 1 MG PO TABS
1.0000 mg | ORAL_TABLET | Freq: Every day | ORAL | Status: DC
  Administered 2024-07-06 – 2024-07-07 (×2): 1 mg via ORAL
  Filled 2024-07-06 (×2): qty 1

## 2024-07-06 MED ORDER — LACTULOSE 10 GM/15ML PO SOLN
10.0000 g | Freq: Two times a day (BID) | ORAL | Status: DC
  Administered 2024-07-06: 10 g via ORAL
  Filled 2024-07-06: qty 15

## 2024-07-06 MED ORDER — ALBUTEROL SULFATE (2.5 MG/3ML) 0.083% IN NEBU: 2.5000 mg | INHALATION_SOLUTION | RESPIRATORY_TRACT | Status: DC | PRN

## 2024-07-06 MED ORDER — SODIUM CHLORIDE 0.9% IV SOLUTION: Freq: Once | INTRAVENOUS | Status: DC

## 2024-07-06 MED ORDER — ALBUMIN HUMAN 25 % IV SOLN: 50.0000 g | Freq: Three times a day (TID) | INTRAVENOUS | Status: DC

## 2024-07-06 MED ORDER — PANTOPRAZOLE SODIUM 40 MG IV SOLR
40.0000 mg | Freq: Two times a day (BID) | INTRAVENOUS | Status: DC
  Administered 2024-07-06 – 2024-07-07 (×3): 40 mg via INTRAVENOUS
  Filled 2024-07-06 (×3): qty 10

## 2024-07-06 MED ORDER — PANTOPRAZOLE SODIUM 40 MG IV SOLR
40.0000 mg | Freq: Once | INTRAVENOUS | Status: AC
  Administered 2024-07-06: 40 mg via INTRAVENOUS
  Filled 2024-07-06: qty 10

## 2024-07-06 NOTE — ED Provider Notes (Signed)
 [place] [facility] Provider Note   CSN: [id number] Arrival date & time: 07/06/24  1547     Patient presents with: Dizziness, Jaundice, Abdominal Pain, and Weakness  HPI Nathaniel Warren is a 58 y.o. male with h/o alcoholic cirrhosis, stroke, seizures presenting for generalized weakness.  He reports incontinence of both bowel and bladder for the last couple of days.  States he has been extremely fatigued. Denies chest pain and shortness of breath. Does report generalized abdominal pain.  States he missed his paracentesis appointment today because of his symptoms.  Denies fever.  He notes that his stool has been black in color for the past week.    Dizziness Associated symptoms: weakness   Abdominal Pain Weakness Associated symptoms: abdominal pain and dizziness        Prior to Admission medications   Medication Sig Start Date End Date Taking? Authorizing Provider  aMILoride  (MIDAMOR ) 5 MG tablet Take 2 tablets (10 mg total) by mouth daily. 07/01/24   Zehr, Jessica D, PA-C  folic acid  (FOLVITE ) 1 MG tablet Take 1 tablet (1 mg total) by mouth daily. 04/11/24     furosemide  (LASIX ) 40 MG tablet Take 2 tablets (80 mg total) by mouth daily. 03/25/24   Swayze, Ava, DO  lactulose  (CHRONULAC ) 10 GM/15ML solution Take 15 mLs (10 g total) by mouth 2 (two) times daily as needed for mild constipation. 05/25/24   Elpidio Reyes DEL, MD  midodrine  (PROAMATINE ) 5 MG tablet Take 1 tablet (5 mg total) by mouth 3 (three) times daily with meals. 05/25/24   Elpidio Reyes DEL, MD  nicotine  (NICOTINE  STEP 2) 14 mg/24hr patch Place 1 patch on the skin daily as needed (for nicotine  craving if patient desires). 01/22/24     pantoprazole  (PROTONIX ) 40 MG tablet Take 1 tablet (40 mg total) by mouth daily. 07/01/24   Zehr, Jessica D, PA-C  PARoxetine  (PAXIL ) 10 MG tablet Take 10 mg by mouth daily.    [provider]  potassium chloride  SA (KLOR-CON  M) 20 MEQ tablet Take 2  tablets (40 mEq total) by mouth daily for 1 day, THEN 1 tablet (20 mEq total) daily. Patient not taking: No sig reported 04/02/24 05/27/24  Cara Elida HERO, NP  QUEtiapine  (SEROQUEL ) 25 MG tablet TAKE 1 TABLET BY MOUTH AT BEDTIME NEED  OFFICE  VISIT Patient taking differently: Take 25 mg by mouth at bedtime as needed (racing thoughts). 07/20/23   Oley Bascom RAMAN, NP  rifaximin  (XIFAXAN ) 550 MG TABS tablet Take 1 tablet (550 mg total) by mouth 2 (two) times daily. 06/16/24 01/12/25  Mansouraty, Gabriel Jr., MD  senna (SENOKOT) 8.6 MG tablet Take 2 tablets (17.2 mg total) by mouth every evening. 04/11/24     thiamine  (VITAMIN B1) 100 MG tablet Take 1 tablet (100 mg total) by mouth daily. 04/11/24     rivaroxaban  (XARELTO ) 20 MG TABS tablet Take 1 tablet (20 mg total) by mouth daily. 03/07/24 04/11/24      Allergies: Nsaids    Review of Systems  Gastrointestinal:  Positive for abdominal pain.  Neurological:  Positive for dizziness and weakness.    Physical Exam   Vitals:   07/06/24 1815 07/06/24 1830  BP: (!) 104/58 (!) 98/53  Pulse: 78 73  Resp: 10 12  Temp:    SpO2: 100% 100%    CONSTITUTIONAL:  chronically unwell appearing, NAD NEURO:  Alert and oriented x 3, CN 3-12 grossly intact EYES:  eyes equal and reactive,  scleral icterus ENT/NECK:  Supple, no stridor  CARDIO: regular rate and rhythm, appears well-perfused  PULM:  No respiratory distress, CTAB, no wheezing GI/GU: Distended, soft, nontender, rectal: Copious melanotic stool.  Chaperone present MSK/SPINE:  No gross deformities, no edema, moves all extremities  SKIN: Jaundice, no rash, atraumatic  *Additional and/or pertinent findings included in MDM below   (all labs ordered are listed, but only abnormal results are displayed) Labs Reviewed  COMPREHENSIVE METABOLIC PANEL WITH GFR - Abnormal; Notable for the following components:      Result Value   Sodium 133 (*)    CO2 21 (*)    BUN 41 (*)    Creatinine, Ser 2.35 (*)     Calcium 8.3 (*)    Albumin  1.6 (*)    AST 94 (*)    Alkaline Phosphatase 163 (*)    Total Bilirubin 19.8 (*)    GFR, Estimated 32 (*)    All other components within normal limits  CBC - Abnormal; Notable for the following components:   WBC 13.0 (*)    RBC 1.92 (*)    Hemoglobin 7.6 (*)    HCT 22.8 (*)    MCV 118.8 (*)    MCH 39.6 (*)    RDW 19.9 (*)    Platelets 55 (*)    nRBC 0.4 (*)    All other components within normal limits  I-STAT CG4 LACTIC ACID, ED - Abnormal; Notable for the following components:   Lactic Acid, Venous 3.5 (*)    All other components within normal limits  POC OCCULT BLOOD, ED - Abnormal; Notable for the following components:   Fecal Occult Bld POSITIVE (*)    All other components within normal limits  I-STAT CHEM 8, ED - Abnormal; Notable for the following components:   Sodium 134 (*)    BUN 39 (*)    Creatinine, Ser 2.40 (*)    Calcium, Ion 0.96 (*)    Hemoglobin 8.2 (*)    HCT 24.0 (*)    All other components within normal limits  CULTURE, BLOOD (ROUTINE X 2)  CULTURE, BLOOD (ROUTINE X 2)  BODY FLUID CULTURE W GRAM STAIN  LIPASE, BLOOD  AMMONIA  URINALYSIS, ROUTINE W REFLEX MICROSCOPIC  PROTIME-INR  CBC  BASIC METABOLIC PANEL WITH GFR  PROTIME-INR  HEPATIC FUNCTION PANEL  CBC  BODY FLUID CELL COUNT WITH DIFFERENTIAL  PROTEIN, PLEURAL OR PERITONEAL FLUID  ALBUMIN , PLEURAL OR PERITONEAL FLUID   TRIGLYCERIDES, BODY FLUIDS  TOTAL BILIRUBIN, BODY FLUID  CBG MONITORING, ED  TYPE AND SCREEN  PREPARE RBC (CROSSMATCH)  CYTOLOGY - NON PAP    EKG: None  Radiology: DG Chest Port 1 View Result Date: 07/06/2024 EXAM: 1 VIEW XRAY OF THE CHEST 07/06/2024 05:34:00 PM COMPARISON: 10/08/2023 CLINICAL HISTORY: Sepsis r/o. Reason for exam: R/O sepsis; Per triage: Pt arrives via EMS from home. Pt reports being incontinent of both bowel and bladder for the past couple of days, increased weakness, decreased appetite, dizziness, abdominal pain, and a  recent fall. AxOx4. Pt is very jaundiced. FINDINGS: LUNGS AND PLEURA: Elevated right hemidiaphragm with right basilar atelectasis or infiltrates. Left lung is clear. No pleural effusion or pneumothorax. HEART AND MEDIASTINUM: Stable cardiomediastinal silhouette. BONES AND SOFT TISSUES: No acute osseous abnormality. IMPRESSION: 1. Right basilar atelectasis or infiltrates. Electronically signed by: Norman Gatlin MD 07/06/2024 05:39 PM EDT RP Workstation: [id number]   DG Ankle Complete Left Result Date: 07/06/2024 CLINICAL DATA:  Left ankle pain. EXAM: LEFT ANKLE COMPLETE -  3+ VIEW COMPARISON:  None Available. FINDINGS: No acute fracture or dislocation. The bones are osteopenic. The ankle mortise is intact. The soft tissues unremarkable. IMPRESSION: 1. No acute fracture or dislocation. 2. Osteopenia. Electronically Signed   By: Vanetta Chou M.D.   On: 07/06/2024 16:43     .Critical Care  Performed by: Lang Norleen POUR, PA-C Authorized by: Lang Norleen POUR, PA-C   Critical care provider statement:    Critical care time (minutes):  30   Critical care was necessary to treat or prevent imminent or life-threatening deterioration of the following conditions:  Renal failure (Symptomatic anemia secondary to GI bleed.  Given blood transfusion here.)   Critical care was time spent personally by me on the following activities:  Development of treatment plan with patient or surrogate, discussions with consultants, evaluation of patient's response to treatment, examination of patient, ordering and review of laboratory studies, ordering and review of radiographic studies, ordering and performing treatments and interventions, pulse oximetry, re-evaluation of patient's condition and review of old charts Paracentesis  Date/Time: 07/06/2024 6:39 PM  Performed by: Lang Norleen POUR, PA-C Authorized by: Lang Norleen POUR, PA-C   Consent:    Consent obtained:  Verbal   Consent given by:  Patient   Risks, benefits,  and alternatives were discussed: yes     Risks discussed:  Bleeding and bowel perforation   Alternatives discussed:  No treatment Universal protocol:    Procedure explained and questions answered to patient or proxy's satisfaction: yes     Relevant documents present and verified: yes     Test results available: yes     Patient identity confirmed:  Verbally with patient and arm band Pre-procedure details:    Procedure purpose:  Diagnostic   Preparation: Patient was prepped and draped in usual sterile fashion   Anesthesia:    Anesthesia method:  Local infiltration   Local anesthetic:  Lidocaine  1% w/o epi Procedure details:    Needle gauge:  18   Ultrasound guidance: yes     Puncture site:  R lower quadrant   Fluid appearance:  Amber   Dressing:  Adhesive bandage Post-procedure details:    Procedure completion:  Tolerated well, no immediate complications    Medications Ordered in the ED  octreotide  (SANDOSTATIN ) 2 mcg/mL load via infusion 25 mcg (25 mcg Intravenous Bolus from Bag 07/06/24 1703)    And  octreotide  (SANDOSTATIN ) 500 mcg in sodium chloride  0.9 % 250 mL (2 mcg/mL) infusion (50 mcg/hr Intravenous New Bag/Given 07/06/24 1702)  pantoprazole  (PROTONIX ) injection 40 mg (has no administration in time range)  midodrine  (PROAMATINE ) tablet 10 mg (has no administration in time range)  rifaximin  (XIFAXAN ) tablet 550 mg (has no administration in time range)  cefTRIAXone  (ROCEPHIN ) 2 g in sodium chloride  0.9 % 100 mL IVPB (has no administration in time range)  thiamine  (VITAMIN B1) tablet 100 mg (has no administration in time range)  phytonadione  (VITAMIN K ) 5 mg in dextrose  5 % 50 mL IVPB (has no administration in time range)  acetaminophen  (TYLENOL ) tablet 650 mg (has no administration in time range)    Or  acetaminophen  (TYLENOL ) suppository 650 mg (has no administration in time range)  sodium chloride  flush (NS) 0.9 % injection 3 mL (has no administration in time range)  sodium  chloride flush (NS) 0.9 % injection 3 mL (has no administration in time range)  0.9 %  sodium chloride  infusion (has no administration in time range)  multivitamin with minerals tablet 1 tablet (  has no administration in time range)  albuterol  (PROVENTIL ) (2.5 MG/3ML) 0.083% nebulizer solution 2.5 mg (has no administration in time range)  folic acid  (FOLVITE ) tablet 1 mg (has no administration in time range)  albumin  human 25 % solution 50 g (has no administration in time range)  lactulose  (CHRONULAC ) 10 GM/15ML solution 10 g (has no administration in time range)  cefTRIAXone  (ROCEPHIN ) 1 g in sodium chloride  0.9 % 100 mL IVPB (0 g Intravenous Stopped 07/06/24 1743)  pantoprazole  (PROTONIX ) injection 40 mg (40 mg Intravenous Given 07/06/24 1649)  0.9 %  sodium chloride  infusion ( Intravenous New Bag/Given 07/06/24 1803)                                    Medical Decision Making Amount and/or Complexity of Data Reviewed Labs: ordered. Radiology: ordered.  Risk Prescription drug management. Decision regarding hospitalization.   Initial Impression and Ddx 58 year old chronically unwell appearing male presenting for generalized weakness.  Exam notable for copious melanotic stool and jaundiced appearing and distended abdomen.  DDx includes SBP, GI bleed, esophageal varices, intra-abdominal infection, other. Patient PMH that increases complexity of ED encounter:  h/o alcoholic cirrhosis, stroke, seizures  Interpretation of Diagnostics - I independent reviewed and interpreted the labs as followed: Lactic acidosis, hemoglobin 7.6 (last check 9.9, five days ago), leukocytosis, total bili is 19.8  - I independently visualized the following imaging with scope of interpretation limited to determining acute life threatening conditions related to emergency care: Chest x-ray, which revealed right basilar atelectasis versus infiltrates, x-ray of the left ankle with no acute findings  -I personally  reviewed interpret EKG which revealed sinus rhythm  Patient Reassessment and Ultimate Disposition/Management On reassessment, blood pressure still soft but MAPs above 60.  Ordered 2 units of PRBCs.  Gave 1 dose of ceftriaxone .  Suspect primary problem is GI bleed but will also surveilled for sepsis given the leukocytosis and soft blood pressure.  Admitted to hospital service with Dr. Raenelle.  I performed a diagnostic paracentesis at bedside.   Also discussed with Amy Esterwood of [facility], GI is planning to see him at bedside.  Patient management required discussion with the following services or consulting groups:  Hospitalist Service  Complexity of Problems Addressed Acute complicated illness or Injury  Additional Data Reviewed and Analyzed Further history obtained from: Past medical history and medications listed in the EMR and Prior ED visit notes  Patient Encounter Risk Assessment Consideration of hospitalization      Final diagnoses:  Gastrointestinal hemorrhage, unspecified gastrointestinal hemorrhage type    ED Discharge Orders     None          Lang Norleen POUR, PA-C 07/06/24 1846    Yolande Lamar BROCKS, MD 07/06/24 [phone]

## 2024-07-06 NOTE — Telephone Encounter (Signed)
 Left him another detailed message in regards to his lasix  dosage and what he should be taking.

## 2024-07-06 NOTE — H&P (Addendum)
 History and Physical    Patient: Nathaniel Warren FMW:[id number] DOB: 1966-03-12 DOA: 07/06/2024 DOS: the patient was seen and examined on 07/06/2024 PCP: Oley Bascom RAMAN, NP  Patient coming from: Home  Chief Complaint:  Chief Complaint  Patient presents with   Dizziness   Jaundice   Abdominal Pain   Weakness   HPI: Nathaniel Warren is a 58 y.o. male with medical history significant of alcoholic liver cirrhosis who presented to the hospital with lower abdominal pain, multiple loose stools-black in color-ongoing for at least 2-3 days.  He is not-patient is a poor historian-however-for the past 3-4 days-he has just not been feeling well.  Complains of vague complaints of dizziness/lightheadedness.  He also acknowledges having loose frequent stools that he describes as diarrhea-approximately 3-5 times daily-but for the past 2-3 days they have been bad and black in color.  He apparently sustained a mechanical fall 2-3 days ago and has some bruising over his left ankle, asked if he lost consciousness-he denies-said he just lost his balance as he was dizzy.  He also acknowledges ongoing lower abdominal pain for the past several days.  Denies any fever.  Denies any vomiting or hematemesis.  Denies hematochezia.  Denies any cough.  He subsequently presented to the ED today where he was found to have worsening anemia, AKI-hospitalist service was asked to admit this patient for further evaluation and treatment.  No fever No headache + Dizziness/lightheadedness No chest pain No shortness of breath + Lower abdominal pain No vomiting + Diarrhea-black stools No hematuria   Review of Systems: As mentioned in the history of present illness. All other systems reviewed and are negative. Past Medical History:  Diagnosis Date   Brain bleed (HCC)    Cirrhosis (HCC)    Depression    Drug overdose 10/20/2017   Drug overdose, intentional self-harm, initial encounter (HCC) 10/20/2017   Drug overdose,  intentional, initial encounter (HCC) 10/20/2017   Hernia, abdominal    Liver failure (HCC)    Seizures (HCC)    Stroke (HCC)    Tobacco abuse    Past Surgical History:  Procedure Laterality Date   ANKLE SURGERY     ANKLE SURGERY Right    ESOPHAGOGASTRODUODENOSCOPY Left 03/20/2024   Procedure: EGD (ESOPHAGOGASTRODUODENOSCOPY);  Surgeon: [place] Sandor GAILS, DO;  Location: [facility];  Service: Gastroenterology;  Laterality: Left;   HERNIA REPAIR     INCISION AND DRAINAGE OF WOUND Left 02/03/2023   Procedure: IRRIGATION AND DEBRIDEMENT WOUND LEFT RING FINGER POSSIBLE AMPUTATION;  Surgeon: Alyse Lynwood, MD;  Location: [facility];  Service: Orthopedics;  Laterality: Left;   IR PARACENTESIS  11/12/2023   IR PARACENTESIS  12/01/2023   IR PARACENTESIS  02/29/2024   IR PARACENTESIS  05/23/2024   IR PARACENTESIS  06/24/2024   SPLENECTOMY     Social History:  reports that he has been smoking cigarettes. He has a 30 pack-year smoking history. He has quit using smokeless tobacco. He reports that he does not currently use alcohol . He reports current drug use. Drug: Marijuana.  Allergies  Allergen Reactions   Nsaids Other (See Comments)    Cannot take due to liver condition    Family History  Problem Relation Age of Onset   COPD Mother    Diabetes Father    Stroke Maternal Grandmother    Alzheimer's disease Maternal Grandfather    Cancer Paternal Grandmother    Stomach cancer Paternal Grandfather    Hypertension Other    Colon cancer Neg  Hx    Esophageal cancer Neg Hx    Colon polyps Neg Hx    Inflammatory bowel disease Neg Hx    Liver disease Neg Hx    Pancreatic cancer Neg Hx    Rectal cancer Neg Hx     Prior to Admission medications   Medication Sig Start Date End Date Taking? Authorizing Provider  aMILoride  (MIDAMOR ) 5 MG tablet Take 2 tablets (10 mg total) by mouth daily. 07/01/24   Zehr, Jessica D, PA-C  folic acid  (FOLVITE ) 1 MG tablet Take 1 tablet (1 mg total) by mouth daily.  04/11/24     furosemide  (LASIX ) 40 MG tablet Take 2 tablets (80 mg total) by mouth daily. 03/25/24   Swayze, Ava, DO  lactulose  (CHRONULAC ) 10 GM/15ML solution Take 15 mLs (10 g total) by mouth 2 (two) times daily as needed for mild constipation. 05/25/24   Elpidio Reyes DEL, MD  midodrine  (PROAMATINE ) 5 MG tablet Take 1 tablet (5 mg total) by mouth 3 (three) times daily with meals. 05/25/24   Elpidio Reyes DEL, MD  nicotine  (NICOTINE  STEP 2) 14 mg/24hr patch Place 1 patch on the skin daily as needed (for nicotine  craving if patient desires). 01/22/24     pantoprazole  (PROTONIX ) 40 MG tablet Take 1 tablet (40 mg total) by mouth daily. 07/01/24   Zehr, Jessica D, PA-C  PARoxetine  (PAXIL ) 10 MG tablet Take 10 mg by mouth daily.    [provider]  potassium chloride  SA (KLOR-CON  M) 20 MEQ tablet Take 2 tablets (40 mEq total) by mouth daily for 1 day, THEN 1 tablet (20 mEq total) daily. Patient not taking: No sig reported 04/02/24 05/27/24  Kennedy-Smith, Colleen M, NP  QUEtiapine  (SEROQUEL ) 25 MG tablet TAKE 1 TABLET BY MOUTH AT BEDTIME NEED  OFFICE  VISIT Patient taking differently: Take 25 mg by mouth at bedtime as needed (racing thoughts). 07/20/23   Oley Bascom RAMAN, NP  rifaximin  (XIFAXAN ) 550 MG TABS tablet Take 1 tablet (550 mg total) by mouth 2 (two) times daily. 06/16/24 01/12/25  Mansouraty, Gabriel Jr., MD  senna (SENOKOT) 8.6 MG tablet Take 2 tablets (17.2 mg total) by mouth every evening. 04/11/24     thiamine  (VITAMIN B1) 100 MG tablet Take 1 tablet (100 mg total) by mouth daily. 04/11/24     rivaroxaban  (XARELTO ) 20 MG TABS tablet Take 1 tablet (20 mg total) by mouth daily. 03/07/24 04/11/24      Physical Exam: Vitals:   07/06/24 1715 07/06/24 1730 07/06/24 1745 07/06/24 1757  BP: (!) 91/38 (!) 92/54 (!) 95/43 103/63  Pulse: 71 83 73 87  Resp: 20 14 19 20   Temp:      TempSrc:      SpO2: 100% 100% 100% 100%   Gen Exam: Slight lethargic-grossly icteric.  But not in any  distress. HEENT:atraumatic, normocephalic Chest: B/L clear to auscultation anteriorly CVS:S1S2 regular Abdomen: Soft-tender in the lower abdomen-distended but not tense. Extremities:+++ edema.  Bruising/ecchymosis around the left ankle. Neurology: Non focal Skin: no rash  Data Reviewed:     Latest Ref Rng & Units 07/06/2024    4:38 PM 07/06/2024    4:02 PM 07/01/2024   11:24 AM  CBC  WBC 4.0 - 10.5 K/uL  13.0  6.2   Hemoglobin 13.0 - 17.0 g/dL 8.2  7.6  9.9   Hematocrit 39.0 - 52.0 % 24.0  22.8  28.0   Platelets 150 - 400 K/uL  55  71.0  C  C Corrected result        Latest Ref Rng & Units 07/06/2024    4:38 PM 07/06/2024    4:02 PM 07/01/2024   11:24 AM  CMP  Glucose 70 - 99 mg/dL 78  86  893   BUN 6 - 20 mg/dL 39  41  11   Creatinine 0.61 - 1.24 mg/dL 7.59  7.64  8.57   Sodium 135 - 145 mmol/L 134  133  134   Potassium 3.5 - 5.1 mmol/L 4.0  3.7  3.6   Chloride 98 - 111 mmol/L 98  98  98   CO2 22 - 32 mmol/L  21  28   Calcium 8.9 - 10.3 mg/dL  8.3  8.7   Total Protein 6.5 - 8.1 g/dL  6.7  7.2   Total Bilirubin 0.0 - 1.2 mg/dL  80.1  84.6   Alkaline Phos 38 - 126 U/L  163  302   AST 15 - 41 U/L  94  67   ALT 0 - 44 U/L  31  24      CXR: Right basilar atelectasis/infiltrate X-ray left ankle: No fracture.  Assessment and Plan: Upper GI bleed with acute blood loss anemia Suspicion for variceal bleeding-has known grade 1 varices on recent EGD Will place on octreotide  infusion PPI Empiric Rocephin  ED provider has already ordered 2 units of PRBC Check INR-but recent INR around 2.5-will start vitamin K  x 3 days. Frequent CBC monitoring Keep n.p.o. [facility] notified-will see in a.m. unless develops hemodynamic instability.  AKI Suspicion for hemodynamically mediated kidney disease in the setting of upper GI bleed.  However-patient at risk for hepatorenal syndrome as well Although volume overloaded-BP soft-hence will hold all diuretics Starting scheduled  midodrine /albumin  Renal ultrasound/UA ordered-currently pending If renal function worsens-will need nephrology evaluation.  Lower abdominal pain Soft but mildly tender lower abdomen without any peritoneal signs Given presentation with AKI/GI bleed-will need to rule out SBP Discussed with ED MD-ED MD to perform diagnostic paracentesis-will avoid large therapeutic tap given AKI. Empiric Rocephin  in the meantime.  Acute metabolic encephalopathy Sleepy/slightly lethargic but ammonia levels are within normal limits Suspect this could be from other metabolic causes like AKI/possible SBP/GI bleed Will treat possible underlying causes-see above Lactulose /rifaximin  will be continued.  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   Reviewed [facility] transplant clinic note on 7/30-not a transplant candidate-due to ongoing nicotine  use-inability to establish with mental health provider.  History of seizure disorder Poor historian-does not appear to be on any antiepileptics Monitor for now.  Mood disorder Holding all anxiety/psych meds for now.  History of tobacco use   Advance Care Planning:   Code Status: Full Code   Consults: GI  Family Communication: None at bedside  Severity of Illness: The appropriate patient status for this patient is INPATIENT. Inpatient status is judged to be reasonable and necessary in order to provide the required intensity of service to ensure the patient's safety. The patient's presenting symptoms, physical exam findings, and initial radiographic and laboratory data in the context of their  chronic comorbidities is felt to place them at high risk for further clinical deterioration. Furthermore, it is not anticipated that the patient will be medically stable for  discharge from the hospital within 2 midnights of admission.   * I certify that at the point of admission it is my clinical judgment that the patient will require inpatient hospital care spanning beyond 2 midnights from the point of admission due to high intensity of service, high risk for further deterioration and high frequency of surveillance required.*  Author: Donalda Applebaum, MD 07/06/2024 6:00 PM  For on call review [email or web address].

## 2024-07-06 NOTE — ED Notes (Addendum)
 Error in charting.

## 2024-07-06 NOTE — ED Triage Notes (Addendum)
 Pt arrives via EMS from home. Pt reports being incontinent of both bowel and bladder for the past couple of days, increased weakness, decreased appetite, dizziness, abdominal pain, and a recent fall. AxOx4. Pt is very jaundice.

## 2024-07-07 ENCOUNTER — Inpatient Hospital Stay (HOSPITAL_COMMUNITY): Admitting: Anesthesiology

## 2024-07-07 ENCOUNTER — Other Ambulatory Visit: Payer: Self-pay

## 2024-07-07 ENCOUNTER — Encounter (HOSPITAL_COMMUNITY): Payer: Self-pay | Admitting: Internal Medicine

## 2024-07-07 ENCOUNTER — Encounter (HOSPITAL_COMMUNITY): Payer: Self-pay

## 2024-07-07 ENCOUNTER — Encounter (HOSPITAL_COMMUNITY): Admission: EM | Disposition: E | Payer: Self-pay | Source: Home / Self Care | Attending: Internal Medicine

## 2024-07-07 ENCOUNTER — Inpatient Hospital Stay: Payer: Self-pay | Admitting: Nurse Practitioner

## 2024-07-07 ENCOUNTER — Ambulatory Visit (HOSPITAL_COMMUNITY): Admitting: Physician Assistant

## 2024-07-07 DIAGNOSIS — Z515 Encounter for palliative care: Secondary | ICD-10-CM | POA: Diagnosis not present

## 2024-07-07 DIAGNOSIS — I8500 Esophageal varices without bleeding: Secondary | ICD-10-CM

## 2024-07-07 DIAGNOSIS — K7031 Alcoholic cirrhosis of liver with ascites: Secondary | ICD-10-CM

## 2024-07-07 DIAGNOSIS — K7210 Chronic hepatic failure without coma: Secondary | ICD-10-CM

## 2024-07-07 DIAGNOSIS — K7030 Alcoholic cirrhosis of liver without ascites: Secondary | ICD-10-CM | POA: Diagnosis not present

## 2024-07-07 DIAGNOSIS — K7460 Unspecified cirrhosis of liver: Secondary | ICD-10-CM | POA: Diagnosis not present

## 2024-07-07 DIAGNOSIS — I8510 Secondary esophageal varices without bleeding: Secondary | ICD-10-CM | POA: Diagnosis not present

## 2024-07-07 DIAGNOSIS — K7682 Hepatic encephalopathy: Secondary | ICD-10-CM

## 2024-07-07 DIAGNOSIS — D62 Acute posthemorrhagic anemia: Secondary | ICD-10-CM

## 2024-07-07 DIAGNOSIS — K922 Gastrointestinal hemorrhage, unspecified: Secondary | ICD-10-CM | POA: Diagnosis not present

## 2024-07-07 DIAGNOSIS — K3189 Other diseases of stomach and duodenum: Secondary | ICD-10-CM | POA: Diagnosis not present

## 2024-07-07 DIAGNOSIS — K766 Portal hypertension: Secondary | ICD-10-CM | POA: Diagnosis not present

## 2024-07-07 DIAGNOSIS — I679 Cerebrovascular disease, unspecified: Secondary | ICD-10-CM

## 2024-07-07 DIAGNOSIS — N186 End stage renal disease: Secondary | ICD-10-CM

## 2024-07-07 DIAGNOSIS — K921 Melena: Secondary | ICD-10-CM

## 2024-07-07 HISTORY — PX: ESOPHAGOGASTRODUODENOSCOPY: SHX5428

## 2024-07-07 LAB — HEPATIC FUNCTION PANEL
ALT: 26 U/L (ref 0–44)
AST: 68 U/L — ABNORMAL HIGH (ref 15–41)
Albumin: 5.2 g/dL — ABNORMAL HIGH (ref 3.5–5.0)
Alkaline Phosphatase: 112 U/L (ref 38–126)
Bilirubin, Direct: 7.7 mg/dL — ABNORMAL HIGH (ref 0.0–0.2)
Indirect Bilirubin: 10 mg/dL — ABNORMAL HIGH (ref 0.3–0.9)
Total Bilirubin: 17.7 mg/dL — ABNORMAL HIGH (ref 0.0–1.2)
Total Protein: 8 g/dL (ref 6.5–8.1)

## 2024-07-07 LAB — URINALYSIS, ROUTINE W REFLEX MICROSCOPIC
Glucose, UA: 50 mg/dL — AB
Ketones, ur: NEGATIVE mg/dL
Leukocytes,Ua: NEGATIVE
Nitrite: NEGATIVE
Protein, ur: 100 mg/dL — AB
RBC / HPF: >50 RBC/hpf (ref 0–5)
Specific Gravity, Urine: 1.02 (ref 1.005–1.030)
pH: 5 (ref 5.0–8.0)

## 2024-07-07 LAB — CBC
HCT: 19.7 % — ABNORMAL LOW (ref 39.0–52.0)
Hemoglobin: 6.8 g/dL — CL (ref 13.0–17.0)
MCH: 37.8 pg — ABNORMAL HIGH (ref 26.0–34.0)
MCHC: 34.5 g/dL (ref 30.0–36.0)
MCV: 109.4 fL — ABNORMAL HIGH (ref 80.0–100.0)
Platelets: 34 K/uL — ABNORMAL LOW (ref 150–400)
RBC: 1.8 MIL/uL — ABNORMAL LOW (ref 4.22–5.81)
RDW: 25.1 % — ABNORMAL HIGH (ref 11.5–15.5)
WBC: 9.5 K/uL (ref 4.0–10.5)
nRBC: 0.6 % — ABNORMAL HIGH (ref 0.0–0.2)

## 2024-07-07 LAB — TRIGLYCERIDES, BODY FLUIDS: Triglycerides, Fluid: 17 mg/dL

## 2024-07-07 LAB — BASIC METABOLIC PANEL WITH GFR
Anion gap: 21 — ABNORMAL HIGH (ref 5–15)
BUN: 41 mg/dL — ABNORMAL HIGH (ref 6–20)
CO2: 16 mmol/L — ABNORMAL LOW (ref 22–32)
Calcium: 7.4 mg/dL — ABNORMAL LOW (ref 8.9–10.3)
Chloride: 97 mmol/L — ABNORMAL LOW (ref 98–111)
Creatinine, Ser: 2.58 mg/dL — ABNORMAL HIGH (ref 0.61–1.24)
GFR, Estimated: 28 mL/min — ABNORMAL LOW (ref >60)
Glucose, Bld: 66 mg/dL — ABNORMAL LOW (ref 70–99)
Potassium: 3.3 mmol/L — ABNORMAL LOW (ref 3.5–5.1)
Sodium: 134 mmol/L — ABNORMAL LOW (ref 135–145)

## 2024-07-07 LAB — GLUCOSE, CAPILLARY: Glucose-Capillary: 82 mg/dL (ref 70–99)

## 2024-07-07 LAB — PREPARE RBC (CROSSMATCH)

## 2024-07-07 LAB — HEMOGLOBIN AND HEMATOCRIT, BLOOD
HCT: 25.5 % — ABNORMAL LOW (ref 39.0–52.0)
HCT: 25.4 % — ABNORMAL LOW (ref 39.0–52.0)
Hemoglobin: 8.8 g/dL — ABNORMAL LOW (ref 13.0–17.0)
Hemoglobin: 8.6 g/dL — ABNORMAL LOW (ref 13.0–17.0)

## 2024-07-07 LAB — SODIUM, URINE, RANDOM: Sodium, Ur: 25 mmol/L

## 2024-07-07 LAB — MAGNESIUM: Magnesium: 1.8 mg/dL (ref 1.7–2.4)

## 2024-07-07 SURGERY — EGD (ESOPHAGOGASTRODUODENOSCOPY)
Anesthesia: Monitor Anesthesia Care

## 2024-07-07 MED ORDER — NA SULFATE-K SULFATE-MG SULF 17.5-3.13-1.6 GM/177ML PO SOLN
0.5000 | Freq: Once | ORAL | Status: DC
  Administered 2024-07-07: 177 mL via ORAL

## 2024-07-07 MED ORDER — LIDOCAINE 2% (20 MG/ML) 5 ML SYRINGE
INTRAMUSCULAR | Status: DC | PRN
  Administered 2024-07-07: 100 mg via INTRAVENOUS

## 2024-07-07 MED ORDER — DEXAMETHASONE SODIUM PHOSPHATE 10 MG/ML IJ SOLN
INTRAMUSCULAR | Status: DC | PRN
  Administered 2024-07-07: 5 mg via INTRAVENOUS

## 2024-07-07 MED ORDER — IPRATROPIUM-ALBUTEROL 0.5-2.5 (3) MG/3ML IN SOLN
RESPIRATORY_TRACT | Status: AC
  Filled 2024-07-07: qty 3

## 2024-07-07 MED ORDER — PROPOFOL 10 MG/ML IV BOLUS
INTRAVENOUS | Status: DC | PRN
Start: 2024-07-07 — End: 2024-07-07
  Administered 2024-07-07: 130 mg via INTRAVENOUS

## 2024-07-07 MED ORDER — SIMETHICONE 80 MG PO CHEW
240.0000 mg | CHEWABLE_TABLET | Freq: Once | ORAL | Status: AC
  Administered 2024-07-07: 240 mg via ORAL

## 2024-07-07 MED ORDER — SODIUM CHLORIDE 0.9 % IV SOLN: INTRAVENOUS | Status: DC

## 2024-07-07 MED ORDER — SIMETHICONE 80 MG PO CHEW
240.0000 mg | CHEWABLE_TABLET | Freq: Once | ORAL | Status: AC
  Filled 2024-07-07: qty 3

## 2024-07-07 MED ORDER — LACTULOSE 10 GM/15ML PO SOLN
20.0000 g | Freq: Three times a day (TID) | ORAL | Status: DC
  Administered 2024-07-07 (×3): 20 g via ORAL
  Filled 2024-07-07 (×3): qty 30

## 2024-07-07 MED ORDER — SUCCINYLCHOLINE CHLORIDE 200 MG/10ML IV SOSY
PREFILLED_SYRINGE | INTRAVENOUS | Status: DC | PRN
  Administered 2024-07-07: 120 mg via INTRAVENOUS

## 2024-07-07 MED ORDER — NA SULFATE-K SULFATE-MG SULF 17.5-3.13-1.6 GM/177ML PO SOLN
0.5000 | Freq: Once | ORAL | Status: AC
  Administered 2024-07-07: 177 mL via ORAL
  Filled 2024-07-07: qty 1

## 2024-07-07 MED ORDER — ONDANSETRON HCL 4 MG/2ML IJ SOLN
INTRAMUSCULAR | Status: DC | PRN
  Administered 2024-07-07: 4 mg via INTRAVENOUS

## 2024-07-07 MED ORDER — IPRATROPIUM-ALBUTEROL 0.5-2.5 (3) MG/3ML IN SOLN
3.0000 mL | Freq: Once | RESPIRATORY_TRACT | Status: AC
  Administered 2024-07-07: 3 mL via RESPIRATORY_TRACT

## 2024-07-07 MED ORDER — SODIUM CHLORIDE 0.9% IV SOLUTION: Freq: Once | INTRAVENOUS | Status: AC

## 2024-07-07 NOTE — Consult Note (Signed)
 Consultation  Referring Provider: TRH/ Ghimire Primary Care Physician:  Oley Bascom RAMAN, NP Primary Gastroenterologist:  Dr.Mansouraty  Reason for Consultation: Severely decompensated cirrhosis, weakness, fatigue, melena, acute kidney injury  HPI: Nathaniel Warren is a 58 y.o. male, with history of EtOH induced cirrhosis, decompensated with history of grade 1 esophageal varices on most recent EGD, isolated gastric varix noted on EGD 2024, history of portal vein thrombosis, anxiety, depression, bipolar disorder, seizure disorder, prior history of CVA, PTSD. Patient is being followed by [facility], last seen there 03/07/2024 and at that time [facility] D of 25.  He had been maintained on carvedilol , amiloride , and Lasix  and has been on Xifaxan  and lactulose  at home.  Had been prophylactically on Xarelto  given portal vein thrombosis-stopped during that admission..  Not felt to be a TIPS candidate.  He has been requiring frequent paracenteses When hospitalized in April 2025 did undergo EGD due to complaints of generalized abdominal pain.  He was found to have grade 1 esophageal varices in the lower third of the esophagus 4 mm in largest diameter, mild portal hypertensive gastropathy in the entire stomach, no gastric varices noted at that time. Readmitted mid June 2025 with complaints of rectal bleeding.  He did have large-volume paracentesis at that time, no evidence of SBP, he also has a large left inguinal hernia which has been causing discomfort.  He did not manifest any evidence of active GI bleeding during that admission and therefore did not undergo any procedures and did not require transfusions. At that time on amiloride  10 mg and Lasix  40 daily.  Midodrine  was started at that time due to persistent hypotension. He was seen in our office for follow-up.  He had undergone a large-volume paracentesis the week previous with 5 L removed.  No changes to his meds at that time. Yesterday presented to  the emergency room with generalized plaints of weakness, and feeling bad all over..  Noted to have gross melena per ER MD. he does admit to falling few days prior to admission, ankle films negative for fracture.  At that time he was complaining of lower abdominal pain no nausea or vomiting, no fever or chills had noted that his stools have been black over the past couple of days and that he was going more frequently than usual. Chest x-ray shows atelectasis versus right basilar infiltrates Renal ultrasound no hydronephrosis moderate amount of ascites.  Labs on admit WBC 13.0/hemoglobin 7.6/hematocrit 22.8 down from hemoglobin of 9.9   5 days previous, platelets 55 Lipase 30 Ammonia 27 Lactate 3.5 Blood cultures pending Sodium 133/potassium 3.7/BUN 41/creatinine 2.35 Albumin  1.6 T. bili 19.8/AST 94/ALT 31/alk phos 163 INR ordered but not done last INR on 07/01/2024 was 2.5  He did undergo paracentesis yesterday-cell counts not consistent with SBP, cytologies and cultures pending  No abdominal imaging this admission.  Patient admits to feeling sleepy and out of it the past couple of days, unable to be specific and having difficulty with conversation, says he has been taking his meds as instructed, no EtOH   Past Medical History:  Diagnosis Date   Brain bleed (HCC)    Cirrhosis (HCC)    Depression    Drug overdose 10/20/2017   Drug overdose, intentional self-harm, initial encounter (HCC) 10/20/2017   Drug overdose, intentional, initial encounter (HCC) 10/20/2017   Hernia, abdominal    Liver failure (HCC)    Seizures (HCC)    Stroke (HCC)    Tobacco abuse  Past Surgical History:  Procedure Laterality Date   ANKLE SURGERY     ANKLE SURGERY Right    ESOPHAGOGASTRODUODENOSCOPY Left 03/20/2024   Procedure: EGD (ESOPHAGOGASTRODUODENOSCOPY);  Surgeon: San Sandor GAILS, DO;  Location: [facility];  Service: Gastroenterology;  Laterality: Left;   HERNIA REPAIR     INCISION AND  DRAINAGE OF WOUND Left 02/03/2023   Procedure: IRRIGATION AND DEBRIDEMENT WOUND LEFT RING FINGER POSSIBLE AMPUTATION;  Surgeon: Alyse Agent, MD;  Location: [facility];  Service: Orthopedics;  Laterality: Left;   IR PARACENTESIS  11/12/2023   IR PARACENTESIS  12/01/2023   IR PARACENTESIS  02/29/2024   IR PARACENTESIS  05/23/2024   IR PARACENTESIS  06/24/2024   SPLENECTOMY      Prior to Admission medications   Medication Sig Start Date End Date Taking? Authorizing Provider  aMILoride  (MIDAMOR ) 5 MG tablet Take 2 tablets (10 mg total) by mouth daily. 07/01/24  Yes Zehr, Jessica D, PA-C  folic acid  (FOLVITE ) 1 MG tablet Take 1 tablet (1 mg total) by mouth daily. 04/11/24  Yes   furosemide  (LASIX ) 40 MG tablet Take 2 tablets (80 mg total) by mouth daily. 03/25/24  Yes Swayze, Ava, DO  lactulose  (CHRONULAC ) 10 GM/15ML solution Take 15 mLs (10 g total) by mouth 2 (two) times daily as needed for mild constipation. 05/25/24  Yes Elpidio Reyes DEL, MD  midodrine  (PROAMATINE ) 5 MG tablet Take 1 tablet (5 mg total) by mouth 3 (three) times daily with meals. 05/25/24  Yes Elpidio Reyes DEL, MD  nicotine  (NICOTINE  STEP 2) 14 mg/24hr patch Place 1 patch on the skin daily as needed (for nicotine  craving if patient desires). 01/22/24  Yes   pantoprazole  (PROTONIX ) 40 MG tablet Take 1 tablet (40 mg total) by mouth daily. 07/01/24  Yes Zehr, Jessica D, PA-C  PARoxetine  (PAXIL ) 10 MG tablet Take 10 mg by mouth daily.   Yes [provider]  QUEtiapine  (SEROQUEL ) 25 MG tablet TAKE 1 TABLET BY MOUTH AT BEDTIME NEED  OFFICE  VISIT Patient taking differently: Take 25 mg by mouth at bedtime as needed (racing thoughts). 07/20/23  Yes Oley Bascom RAMAN, NP  rifaximin  (XIFAXAN ) 550 MG TABS tablet Take 1 tablet (550 mg total) by mouth 2 (two) times daily. 06/16/24 01/12/25 Yes Mansouraty, Aloha Raddle., MD  senna (SENOKOT) 8.6 MG tablet Take 2 tablets (17.2 mg total) by mouth every evening. 04/11/24  Yes   thiamine  (VITAMIN B1) 100 MG  tablet Take 1 tablet (100 mg total) by mouth daily. 04/11/24  Yes   rivaroxaban  (XARELTO ) 20 MG TABS tablet Take 1 tablet (20 mg total) by mouth daily. 03/07/24 04/11/24      Current Facility-Administered Medications  Medication Dose Route Frequency Provider Last Rate Last Admin   0.9 %  sodium chloride  infusion  250 mL Intravenous PRN Raenelle Donalda HERO, MD       acetaminophen  (TYLENOL ) tablet 650 mg  650 mg Oral Q6H PRN Ghimire, Donalda HERO, MD       Or   acetaminophen  (TYLENOL ) suppository 650 mg  650 mg Rectal Q6H PRN Ghimire, Donalda HERO, MD       albumin  human 25 % solution 50 g  50 g Intravenous Q8H Ghimire, Shanker M, MD 60 mL/hr at 07/07/24 0700 Infusion Verify at 07/07/24 0700   albuterol  (PROVENTIL ) (2.5 MG/3ML) 0.083% nebulizer solution 2.5 mg  2.5 mg Nebulization Q2H PRN Ghimire, Donalda HERO, MD       cefTRIAXone  (ROCEPHIN ) 2 g in sodium chloride  0.9 %  100 mL IVPB  2 g Intravenous Q24H Ghimire, Donalda HERO, MD       folic acid  (FOLVITE ) tablet 1 mg  1 mg Oral Daily Ghimire, Donalda HERO, MD   1 mg at 07/06/24 8151   lactulose  (CHRONULAC ) 10 GM/15ML solution 20 g  20 g Oral TID Malyk Girouard S, PA-C       midodrine  (PROAMATINE ) tablet 10 mg  10 mg Oral TID WC Ghimire, Donalda HERO, MD   10 mg at 07/06/24 1847   multivitamin with minerals tablet 1 tablet  1 tablet Oral Daily Raenelle Donalda HERO, MD   1 tablet at 07/06/24 1949   octreotide  (SANDOSTATIN ) 500 mcg in sodium chloride  0.9 % 250 mL (2 mcg/mL) infusion  50 mcg/hr Intravenous Continuous Raenelle Donalda HERO, MD 25 mL/hr at 07/07/24 0700 50 mcg/hr at 07/07/24 0700   pantoprazole  (PROTONIX ) injection 40 mg  40 mg Intravenous Q12H Raenelle Donalda HERO, MD   40 mg at 07/06/24 2219   phytonadione  (VITAMIN K ) 5 mg in dextrose  5 % 50 mL IVPB  5 mg Intravenous Daily Raenelle Donalda HERO, MD   Stopped at 07/06/24 2055   rifaximin  (XIFAXAN ) tablet 550 mg  550 mg Oral BID Raenelle Donalda HERO, MD   550 mg at 07/06/24 2216   sodium chloride  flush (NS) 0.9 %  injection 3 mL  3 mL Intravenous Q12H Ghimire, Donalda HERO, MD       sodium chloride  flush (NS) 0.9 % injection 3 mL  3 mL Intravenous PRN Raenelle Donalda HERO, MD       thiamine  (VITAMIN B1) tablet 100 mg  100 mg Oral Daily Raenelle Donalda HERO, MD   100 mg at 07/06/24 1848    Allergies as of 07/06/2024 - Review Complete 07/06/2024  Allergen Reaction Noted   Nsaids Other (See Comments) 10/08/2023    Family History  Problem Relation Age of Onset   COPD Mother    Diabetes Father    Stroke Maternal Grandmother    Alzheimer's disease Maternal Grandfather    Cancer Paternal Grandmother    Stomach cancer Paternal Grandfather    Hypertension Other    Colon cancer Neg Hx    Esophageal cancer Neg Hx    Colon polyps Neg Hx    Inflammatory bowel disease Neg Hx    Liver disease Neg Hx    Pancreatic cancer Neg Hx    Rectal cancer Neg Hx     Social History   Socioeconomic History   Marital status: Single    Spouse name: Not on file   Number of children: 2   Years of education: 14   Highest education level: Not on file  Occupational History   Occupation: Corporate investment banker  Tobacco Use   Smoking status: Every Day    Current packs/day: 1.00    Average packs/day: 1 pack/day for 30.0 years (30.0 ttl pk-yrs)    Types: Cigarettes   Smokeless tobacco: Former  Building services engineer status: Never Used  Substance and Sexual Activity   Alcohol  use: Not Currently   Drug use: Yes    Types: Marijuana    Comment: last used 07/08/2023 - 1 bowl   Sexual activity: Yes    Comment:  wife had hysterectomy  Other Topics Concern   Not on file  Social History Narrative   ** Merged History Encounter **    Left Handed. Can use right as well.    Unable to work   Lives with roommate and  cousin   Drinks caffeine   One floor house   Social Drivers of Health   Financial Resource Strain: High Risk (07/15/2022)   Overall Financial Resource Strain (CARDIA)    Difficulty of Paying Living Expenses: Very hard   Food Insecurity: Patient Unable To Answer (07/07/2024)   Hunger Vital Sign    Worried About Running Out of Food in the Last Year: Patient unable to answer    Ran Out of Food in the Last Year: Patient unable to answer  Transportation Needs: Patient Declined (05/23/2024)   PRAPARE - Administrator, Civil Service (Medical): Patient declined    Lack of Transportation (Non-Medical): Patient declined  Physical Activity: Inactive (07/15/2022)   Exercise Vital Sign    Days of Exercise per Week: 0 days    Minutes of Exercise per Session: 0 min  Stress: Stress Concern Present (07/15/2022)   [employer] of Occupational Health - Occupational Stress Questionnaire    Feeling of Stress : Very much  Social Connections: Socially Isolated (07/15/2022)   Social Connection and Isolation Panel    Frequency of Communication with Friends and Family: More than three times a week    Frequency of Social Gatherings with Friends and Family: Once a week    Attends Religious Services: Never    Database administrator or Organizations: No    Attends Banker Meetings: Never    Marital Status: Divorced  Catering manager Violence: Patient Declined (05/23/2024)   Humiliation, Afraid, Rape, and Kick questionnaire    Fear of Current or Ex-Partner: Patient declined    Emotionally Abused: Patient declined    Physically Abused: Patient declined    Sexually Abused: Patient declined    Review of Systems: Pertinent positive and negative review of systems were noted in the above HPI section.  All other review of systems was otherwise negative.  Physical Exam: Vital signs in last 24 hours: Temp:  [97.5 F (36.4 C)-98.4 F (36.9 C)] 98.4 F (36.9 C) (07/31 0738) Pulse Rate:  [71-92] 90 (07/31 0738) Resp:  [9-20] 20 (07/31 0738) BP: (87-112)/(38-67) 94/47 (07/31 0738) SpO2:  [95 %-100 %] 99 % (07/31 0738) Last BM Date : 07/07/24 General:   Alert,  Well-developed, chronically and acutely ill  appearing older white male  cooperative in NAD-somnolent, trying to answer questions Head:  Normocephalic and atraumatic. Eyes:  Sclera icteric   conjunctiva pink. Ears:  Normal auditory acuity. Nose:  No deformity, discharge,  or lesions. Mouth:  No deformity or lesions.   Neck:  Supple; no masses or thyromegaly. Lungs:  Clear throughout to auscultation.   No wheezes, crackles, or rhonchi. Heart:  Regular rate and rhythm; loud systolic murmur  Abdomen:  Soft, obese, nontense, full, ascites, no focal tenderness BS active,nonpalp mass or hsm.   Rectal: Not done, gross melena documented per ER MD Msk:  Symmetrical without gross deformities. . Pulses:  Normal pulses noted. Extremities:  Without clubbing or edema. Neurologic: Somnolent, positive asterixis, awakens to voice and tries to answer questions then falls back asleep oriented to hospital, stated the month was 25 Skin:  Intact without significant lesions or rashes.. Psych:  cooperative.  Encephalopathic  Intake/Output from previous day: 07/30 0701 - 07/31 0700 In: 1863.9 [I.V.:328.3; Blood:1260; IV Piggyback:275.6] Out: -  Intake/Output this shift: No intake/output data recorded.  Lab Results: Recent Labs    07/06/24 1602 07/06/24 1638  WBC 13.0*  --   HGB 7.6* 8.2*  HCT 22.8* 24.0*  PLT  55*  --    BMET Recent Labs    07/06/24 1602 07/06/24 1638  NA 133* 134*  K 3.7 4.0  CL 98 98  CO2 21*  --   GLUCOSE 86 78  BUN 41* 39*  CREATININE 2.35* 2.40*  CALCIUM 8.3*  --    LFT Recent Labs    07/06/24 1602  PROT 6.7  ALBUMIN  1.6*  AST 94*  ALT 31  ALKPHOS 163*  BILITOT 19.8*   PT/INR No results for input(s): LABPROT, INR in the last 72 hours. Hepatitis Panel No results for input(s): HEPBSAG, HCVAB, HEPAIGM, HEPBIGM in the last 72 hours.   IMPRESSION:  #79 58 year old white male with severely decompensated EtOH induced cirrhosis, most recent MEL D=30 MELD pending currently as INR  pending. Presented to the emergency room yesterday with complaints of generalized weakness and feeling bad all over. Noted to have gross melena on rectal exam.  Also with evidence of acute kidney injury-in setting of diuretics, rule out HRS  #2 acute hepatic encephalopathy on chronic HD-been on Xifaxan  and lactulose  at home Somnolent, oriented x 2 today  #3 acute/subacute GI bleeding-patient has been hemodynamically stable overnight Last EGD a few months ago with grade 1 esophageal varices and portal gastropathy Hopefully has not developed large varices, but possible, rule out variceal bleed, versus bleed secondary to portal gastropathy versus other.  #4 anemia acute on chronic secondary to GI blood loss #5 severe hyperbilirubinemia-secondary to end-stage liver disease #6 somewhat refractory ascites-has been requiring frequent paracentesis, not felt to be a TIPS candidate Had paracentesis yesterday, no evidence of SBP  #7 abdominal pain-he has been complaining of lower abdominal pain over the past couple of months, does have a fairly large left inguinal hernia which is also been symptomatic-need to repeat abdominal imaging  #8 history of seizure disorder #9 history of portal vein thrombosis-Xarelto  stopped spring 2025 #10 possible right lower lobe pneumonia   PLAN: Keep n.p.o. this morning Continue octreotide , Rocephin , twice daily IV PPI Transfuse 2 units of packed RBCs last p.m., a.m. labs pending ordered hemoglobin stat and INR stat  Increase lactulose  to 20 g 3 times daily Continue Xifaxan  550 twice daily Hold diuretics IV albumin  every 6 hours has been started  Will proceed with EGD today, with Dr. Stacia.  Patient not able to consent for himself currently, will discuss with his daughter by phone.  Patient has had progressive decline over the past few months, prognosis quite guarded. He is established with [facility] but has not yet been listed for transplant as  had not completed mental health evaluation etc   Jay Kempe PA-C 07/07/2024, 8:44 AM

## 2024-07-07 NOTE — Anesthesia Postprocedure Evaluation (Signed)
 Anesthesia Post Note  Patient: Nathaniel Warren  Procedure(s) Performed: EGD (ESOPHAGOGASTRODUODENOSCOPY)     Patient location during evaluation: PACU Anesthesia Type: MAC Level of consciousness: awake and alert Pain management: pain level controlled Vital Signs Assessment: post-procedure vital signs reviewed and stable Respiratory status: spontaneous breathing, nonlabored ventilation, respiratory function stable and patient connected to nasal cannula oxygen Cardiovascular status: stable and blood pressure returned to baseline Postop Assessment: no apparent nausea or vomiting Anesthetic complications: no   No notable events documented.  Last Vitals:  Vitals:   07/07/24 1233 07/07/24 1259  BP:  (!) 99/46  Pulse: 93 86  Resp: 15 16  Temp:  36.9 C  SpO2: 93%     Last Pain:  Vitals:   07/07/24 1259  TempSrc: Oral  PainSc:                  Rome Ade

## 2024-07-07 NOTE — Anesthesia Procedure Notes (Addendum)
 Procedure Name: Intubation Date/Time: 07/07/2024 11:19 AM  Performed by: Dianne Burnard RAMAN, CRNAPre-anesthesia Checklist: Patient identified, Emergency Drugs available, Suction available and Patient being monitored Patient Re-evaluated:Patient Re-evaluated prior to induction Oxygen Delivery Method: Circle system utilized Preoxygenation: Pre-oxygenation with 100% oxygen Induction Type: IV induction, Cricoid Pressure applied and Rapid sequence Laryngoscope Size: McGrath and 4 Grade View: Grade I Tube type: Oral Tube size: 7.0 mm Number of attempts: 1 Airway Equipment and Method: Stylet and Oral airway Placement Confirmation: ETT inserted through vocal cords under direct vision, positive ETCO2 and breath sounds checked- equal and bilateral Secured at: 22 cm Tube secured with: Tape Dental Injury: Teeth and Oropharynx as per pre-operative assessment  Comments: RSI

## 2024-07-07 NOTE — Op Note (Signed)
 [facility] Patient Name: Nathaniel Warren Procedure Date : 07/07/2024 MRN: [id number] Attending MD: Glendia BRAVO. Stacia , MD, [id number] Date of Birth: Jan 15, 1966 CSN: [id number] Age: 58 Admit Type: Inpatient Procedure:                Upper GI endoscopy Indications:              Acute post hemorrhagic anemia, Melena Providers:                Glendia E. Stacia, MD, Particia Fischer, RN, Lorrayne Kitty, Technician Referring MD:              Medicines:                Monitored Anesthesia Care Complications:            No immediate complications. Estimated Blood Loss:     Estimated blood loss: none. Procedure:                Pre-Anesthesia Assessment:                           - Prior to the procedure, a History and Physical                            was performed, and patient medications and                            allergies were reviewed. The patient's tolerance of                            previous anesthesia was also reviewed. The risks                            and benefits of the procedure and the sedation                            options and risks were discussed with the patient.                            All questions were answered, and informed consent                            was obtained. Prior Anticoagulants: The patient has                            taken no anticoagulant or antiplatelet agents. ASA                            Grade Assessment: IV - A patient with severe                            systemic disease that is a constant threat to life.  After reviewing the risks and benefits, the patient                            was deemed in satisfactory condition to undergo the                            procedure.                           After obtaining informed consent, the endoscope was                            passed under direct vision. Throughout the                            procedure, the  patient's blood pressure, pulse, and                            oxygen saturations were monitored continuously. The                            [id number]-H190 ([id number] Olympus endoscope was introduced                            through the mouth, and advanced to the second part                            of duodenum. The upper GI endoscopy was                            accomplished without difficulty. The patient                            tolerated the procedure well. Scope In: Scope Out: Findings:      Grade II varices were found in the lower third of the esophagus. They       were medium in size. There were no bleeding stigmata. Two bands were       successfully placed with complete eradication, resulting in deflation of       varices. There was no bleeding during the procedure.      The exam of the esophagus was otherwise normal.      Moderate portal hypertensive gastropathy was found in the entire       examined stomach.      The exam of the stomach was otherwise normal.      The examined duodenum was normal. Impression:               - Grade II esophageal varices. Completely                            eradicated. Banded.                           - Portal hypertensive gastropathy.                           -  Normal examined duodenum.                           - No specimens collected.                           - Low suspicion for esophageal varices as bleeding                            source given the size and appearance. No blood at                            all in upper GI tract. Suspect bleeding must be                            lower GI in origin. Moderate Sedation:      N/A Recommendation:           - Return patient to hospital ward for ongoing care.                           - Clear liquid diet today.                           - Continue present medications.                           - Tentatively plan for colonoscopy tomorrow if                            patient  continues to experience GI bleeding. If                            bleeding stops, will avoid invasive procedure. CT-A                            relative contraindicated given AKI.                           - Plan for bowel prep this evening.                           - Continue current medical management of GI                            bleeding and acute on chronic liver failure,                            (ceftriaxone , octreotide , PPI, albumin ,                            lactulose /rifaximin , midodrine ) Procedure Code(s):        --- Professional ---                           [phone], Esophagogastroduodenoscopy, flexible,  transoral; with band ligation of esophageal/gastric                            varices Diagnosis Code(s):        --- Professional ---                           I85.00, Esophageal varices without bleeding                           K76.6, Portal hypertension                           K31.89, Other diseases of stomach and duodenum                           D62, Acute posthemorrhagic anemia                           K92.1, Melena (includes Hematochezia) CPT copyright 2022 American Medical Association. All rights reserved. The codes documented in this report are preliminary and upon coder review may  be revised to meet current compliance requirements. Brendan Gruwell E. Stacia, MD 07/07/2024 11:56:45 AM This report has been signed electronically. Number of Addenda: 0

## 2024-07-07 NOTE — Anesthesia Preprocedure Evaluation (Addendum)
 Anesthesia Evaluation  Patient identified by MRN, date of birth, ID band Patient confused    Reviewed: Allergy & Precautions, H&P , NPO status , Patient's Chart, lab work & pertinent test results, Unable to perform ROS - Chart review only  History of Anesthesia Complications Negative for: history of anesthetic complications  Airway Mallampati: IV  TM Distance: >3 FB Neck ROM: Full    Dental  (+) Poor Dentition, Chipped, Dental Advisory Given   Pulmonary neg sleep apnea, neg COPD, Current Smoker and Patient abstained from smoking.   Pulmonary exam normal breath sounds clear to auscultation       Cardiovascular Exercise Tolerance: Good METShypertension, Pt. on home beta blockers (-) CAD and (-) Past MI (-) dysrhythmias  Rhythm:Regular Rate:Normal - Systolic murmurs Echo 02/2022 1. Left ventricular ejection fraction, by estimation, is 65 to 70%. The left ventricle has hyperdynamic function. The left ventricle has no regional wall motion abnormalities. Left ventricular diastolic parameters were normal.   2. Right ventricular systolic function is hyperdynamic. The right ventricular size is normal. Tricuspid regurgitation signal is inadequate for assessing PA pressure.   3. Left atrial size was mildly dilated.   4. The mitral valve is normal in structure. No evidence of mitral valve regurgitation.   5. The aortic valve is tricuspid. Aortic valve regurgitation is not visualized. No aortic stenosis is present.   Comparison(s): Findings suggest high cardiac output (consider anemia, thyrotoxicosis, sepsis, cirrhosis, etc).     Neuro/Psych Seizures -,  PSYCHIATRIC DISORDERS Anxiety Depression    CVA    GI/Hepatic negative GI ROS,neg GERD  ,,(+) Cirrhosis   ascites  substance abuse  alcohol  useEnd stage liver dz, MELD score of 30 Patient coagulopathic due to liver failure S/p paracentesis   Endo/Other  negative endocrine ROSneg diabetes     Renal/GU negative Renal ROS  negative genitourinary   Musculoskeletal negative musculoskeletal ROS (+)    Abdominal  (+)  Abdomen: soft. Distended abdomen   Peds negative pediatric ROS (+)  Hematology  (+) Blood dyscrasia, anemia thrombocytopenia   Anesthesia Other Findings Past Medical History: No date: Brain bleed (HCC) No date: Cirrhosis (HCC) No date: Depression 10/20/2017: Drug overdose 10/20/2017: Drug overdose, intentional self-harm, initial encounter  (HCC) 10/20/2017: Drug overdose, intentional, initial encounter (HCC) No date: Hernia, abdominal No date: Liver failure (HCC) No date: Seizures (HCC) No date: Stroke (HCC) No date: Tobacco abuse  Reproductive/Obstetrics negative OB ROS                              Anesthesia Physical Anesthesia Plan  ASA: 4  Anesthesia Plan: MAC   Post-op Pain Management: Minimal or no pain anticipated   Induction: Intravenous and Rapid sequence  PONV Risk Score and Plan: 0 and Treatment may vary due to age or medical condition, Ondansetron  and Dexamethasone   Airway Management Planned: Natural Airway  Additional Equipment: None  Intra-op Plan:   Post-operative Plan: Extubation in OR  Informed Consent: I have reviewed the patients History and Physical, chart, labs and discussed the procedure including the risks, benefits and alternatives for the proposed anesthesia with the patient or authorized representative who has indicated his/her understanding and acceptance.     Dental advisory given  Plan Discussed with: CRNA  Anesthesia Plan Comments: (Discussed risks of anesthesia with patient's daughter Crystal by phone due to patient's confusion, including PONV, sore throat, lip/dental/eye damage. Rare risks discussed as well, such as cardiorespiratory and neurological sequelae,  and allergic reactions. counseled on being higher risk for anesthesia due to comorbidities: end stage cirrhosis,  severe anemia. She was told about increased risk of cardiac and respiratory events, including death. Discussed the role of CRNA in patient's perioperative care. She understands. Discussed end of life goals. Daughter believes father would want resuscitation in the event of perioperative cardiac arrest, but that he has made it known prior he would not want to be a vegetable on life support.)        Anesthesia Quick Evaluation

## 2024-07-07 NOTE — Plan of Care (Signed)
  Problem: Safety: Goal: Ability to remain free from injury will improve 07/07/2024 0740 by Perlie Lauraine BROCKS, RN Outcome: Progressing 07/07/2024 0701 by Perlie Lauraine BROCKS, RN Outcome: Progressing

## 2024-07-07 NOTE — Consult Note (Signed)
 Consultation Note Date: 07/07/2024   Patient Name: Nathaniel Warren  DOB: 1966-01-31  MRN: [id number]  Age / Sex: 58 y.o., male  PCP: Oley Bascom RAMAN, NP Referring Physician: Raenelle Coria, MD  Reason for Consultation: Establishing goals of care  HPI/Patient Profile: 58 y.o. male  with past medical history of alcoholic cirrhosis, esophageal varices, liver failure, depression, anxiety, PTSD, history of suicide attempt, and history of brain bleed admitted on 07/06/2024 with abdominal pain, multiple loose tarry stools, vague complaints of dizziness and weakness.  Admitted for GI bleed and decompensated alcoholic cirrhosis.    Has had recurrent hospitalizations in the past 6 months for progressive decompensated liver failure.  Was evaluated by transplant team in [place] but deemed not a candidate.  Most recent admission was 05/2024 for GI bleed related to liver cirrhosis.  Workup completed during this admission revealed progressive anemia with hemoglobin of 7.6 g/dL on admission.  Received blood products (2 units of packed red blood cells) hemoglobin improved to 8.2 posttransfusion however, on recheck 07/07/2024 hemoglobin had further dropped to 6.8 g/dL.  2 additional units of packed red blood cells ordered.  Also noted to have progressively increasing renal function (hepatorenal syndrome versus AKI) and with grossly abnormal transaminase and bilirubin levels.  He is also encephalopathic.  Status post EGD with some varices present 2 bands were placed and no further bleeding during procedure was noted.  Tentative plan is to proceed with colonoscopy tomorrow to determine source of bleeding however, concerned about his ability to tolerate prep and the procedure.  SBP workup reassuring but remains on Rocephin  for prophylaxis.  Patient is critically ill and with significant chance for further decompensation.  Today, patient seen at bedside and is grossly encephalopathic.  He does  not respond to questioning.  History obtained from patient's daughter Engineer, building services) who was at bedside.  See below for more details.  PMT has been consulted to assist with goals of care conversation.  Clinical Assessment and Goals of Care:  I have reviewed medical records including EPIC notes, labs (independently reviewed) and imaging, outside records including those from [facility] regarding his liver transplant and hospitalizations with [facility], and outpatient epic notes.  Also collaborated with admitting team, TOC, and bedside nurse.  Assessed the patient and then met with patient and daughter to discuss diagnosis prognosis, GOC, EOL wishes, disposition and options.  I introduced Palliative Medicine as specialized medical care for people living with serious illness. It focuses on providing relief from the symptoms and stress of a serious illness. The goal is to improve quality of life for both the patient and the family.  We discussed a brief life review of the patient and then focused on their current illness.   I attempted to elicit values and goals of care important to the patient.    Medical History Review and Family/Patient Understanding:   Patient's daughter has a good understanding of her father's condition at this time.  She is aware that his condition is terminal and that he is at high risk for rapidly decompensating.  She is aware that even if he improves somewhat during this hospitalization the likelihood of him being able to go home independently is extremely unlikely and that even if he does improve a little bit during this hospitalization he will continue to decline and likely decline rapidly related to his liver disease.    Social History:  Patient had a son and a daughter.  His son passed away.  His daughter remains  very supportive in his who I collaborated with today.  She lives nearby but has 3 children of her own home.  She helps him as much as she can but cannot  provide 24/7 care.  Patient lives with a roommate.  His roommate has health issues and also works so he is not able to provide 24/7 care.  His daughter shares that he was a proud/independent man and always loved fishing.  She did share about his struggle with alcohol  but states that he was able to give up drinking.  Functional and Nutritional State:  Living independently with a roommate prior to admission.  However, patient's daughter reports that he has been having more trouble getting around over the past few months and started having use a cane.  Then over the past week, he became very weak and has been unable to get out of bed.  She also notes he had become incontinent of both bowel and bladder.  P.o. intake is poor.  Palliative Symptoms:  Pain  Advance Directives:  The daughter consented to a voluntary Advance Care Planning Conversation in person.  A detailed discussion regarding advanced directives was had.  We spoke at length about patient's acute on chronic hepatic liver failure.  Discussed the disease trajectory of hepatic failure.  We talked about their prior discussion regarding his desires if his health were to worsen.  His daughter states that he is always said that if he could not think for himself to let him go and that he would not want to be kept alive on life support.  He always valued his independence.  She shares that when he came into the hospital this time he had a period of lucidity where he said he was not sure he would make it out of the hospital at this time.  She shares that it is hard seeing him like this as he is constantly moaning and asking for help and she wants him to be comfortable and have good quality of life if in fact he is at end-of-life.  We discussed that given where he is with his rapidly progressive and declining hepatic function with high risk for decompensation as well as his previously expressed goals of care to maintain independence and her desire for him  to be comfortable at the end of life, would recommend DNR/DNI.  She is agreeable to this.  She would consider reversing DNR/DNI if needed for procedures but otherwise if he passes away would want him to pass comfortably and with dignity.  She would be okay escalating care to ICU level in order to allow her to get here to say goodbye but would not want him to be intubated or have chest compressions.  We then discussed anticipatory care planning.  She is aware that his condition is terminal and will continue to deteriorate.  I shared that he would be appropriate for hospice.  We had a lengthy discussion about hospice and what that care looks like as well as the care locations.  She is very concerned as he is not safe to return home as he will need 24-hour care.  We discussed the various locations where care can be provided either in the SNF versus inpatient hospice.  We discussed the criteria required for inpatient hospice.  We discussed next steps in his care.  Ultimately, felt best to give time for outcomes.  If he does not improve in the next 24 to 48 hours and if he is not able to  proceed with colonoscopy and will only be supported with IV products and remains encephalopathic then plan will be to transition to comfort care.  If transition to full comfort care and remains in current condition I feel he would be appropriate for inpatient hospice.  If his hepatic encephalopathy improves and he improves to a point where he would be able to undergo colonoscopy and therapy discussed that he may be appropriate for SNF discharge with outpatient palliative follow-up.  Will plan to follow-up with daughter tomorrow to reassess and determine next steps.  For the time she has elected for DNR/DNI but okay to escalate care to ICU level in order to allow her to get to the bedside to say goodbye and then at that point would transition to comfort care.  Time for outcomes.   I spent 30 minutes providing separately identifiable  ACP services with the patient and/or surrogate decision maker in a voluntary, in-person conversation discussing the patient's wishes and goals as detailed in the above note.    The difference between aggressive medical intervention and comfort care was considered in light of the patient's goals of care. Hospice and Palliative Care services outpatient were explained and offered.   Discussed the importance of continued conversation with family and the medical providers regarding overall plan of care and treatment options, ensuring decisions are within the context of the patient's values and GOCs.   Questions and concerns were addressed.  Will plan to meet again with daughter tomorrow 2024-07-28 to further discuss.  PMT will continue to support holistically.   NEXT OF KIN (Crystal Paschal)  *Patient's daughter states she is his only living child.  The patient is not married.  She states that they were working on establishing healthcare power of attorney and had initiated the paperwork and discussions regarding advance care planning however, before they could complete the paperwork he became sick.  The patient's father is still alive but he does not live locally and is unable to contribute to goals of care conversations and decision making due to own his health conditions.    SUMMARY OF RECOMMENDATIONS    Transition from full code to DNR/DNI, Golden rod form completed Okay to escalate care for procedures or if he declines to allow daughter to get to bedside to say goodbye but do not code/intubate Time for outcomes (24-48 hrs) If no to minimal improvement in encephalopathy and clinical status within 24 to 48 hours plan is to transition to comfort care  MOST to be completed pending time for outcomes PMT meeting with daughter planned for 2024/07/28  Code Status/Advance Care Planning: DNR   Symptom Management:  Lactulose /rifaximin  for encephalopathy Tylenol  as needed  Prognosis:   Poor/guarded  Discharge Planning: To Be Determined      Primary Diagnoses: Present on Admission:  UGI bleed  Alcohol  use disorder  MDD (major depressive disorder), recurrent severe, without psychosis (HCC)  Anasarca  Hyperbilirubinemia  Thrombocytopenia (HCC)  Alcoholic cirrhosis (HCC)    Physical Exam Constitutional:      Appearance: He is ill-appearing.  Pulmonary:     Effort: Pulmonary effort is normal. No respiratory distress.  Abdominal:     Comments: ascites  Skin:    Coloration: Skin is jaundiced.     Findings: Bruising present.  Neurological:     Mental Status: He is lethargic and disoriented.     Comments: Frequently moans and says help me but when asked how we can help is unable to respond.     Vital  Signs: BP (!) 88/46   Pulse 90   Temp 98.6 F (37 C) (Axillary)   Resp 16   SpO2 93%  Pain Scale: Faces   Pain Score: 7    SpO2: SpO2: 93 % O2 Device:SpO2: 93 % O2 Flow Rate: .O2 Flow Rate (L/min): 2 L/min   Palliative Assessment/Data: 20%    Billing based on MDM: high  Problems Addressed: One acute or chronic illness or injury that poses a threat to life or bodily function  Amount and/or Complexity of Data: Category 2:Independent interpretation of a test performed by another physician/other qualified health care professional (not separately reported), Obtained independent history   Risks: Decision not to resuscitate or to de-escalate care because of poor prognosis   Laymon CHRISTELLA Pinal, NP  Palliative Medicine Team Team phone # [phone]  Thank you for allowing the Palliative Medicine Team to assist in the care of this patient. Please utilize secure chat with additional questions, if there is no response within 30 minutes please call the above phone number.  Palliative Medicine Team providers are available by phone from 7am to 7pm daily and can be reached through the team cell phone.  Should this patient require assistance outside of  these hours, please call the patient's attending physician.

## 2024-07-07 NOTE — Transfer of Care (Signed)
 Immediate Anesthesia Transfer of Care Note  Patient: Nathaniel Warren  Procedure(s) Performed: EGD (ESOPHAGOGASTRODUODENOSCOPY)  Patient Location: PACU  Anesthesia Type:General  Level of Consciousness: awake and drowsy  Airway & Oxygen Therapy: Patient Spontanous Breathing and Patient connected to face mask oxygen  Post-op Assessment: Report given to RN and Post -op Vital signs reviewed and stable  Post vital signs: Reviewed and stable  Last Vitals:  Vitals Value Taken Time  BP 131/58 07/07/24 12:02  Temp 36.9 C 07/07/24 12:02  Pulse 109 07/07/24 12:05  Resp 22 07/07/24 12:05  SpO2 91 % 07/07/24 12:05  Vitals shown include unfiled device data.  Last Pain:  Vitals:   07/07/24 1202  TempSrc: Temporal  PainSc:          Complications: No notable events documented.

## 2024-07-07 NOTE — Progress Notes (Signed)
 PROGRESS NOTE    Nathaniel Warren  FMW:[id number] DOB: 03-12-1966 DOA: 07/06/2024 PCP: Oley Bascom RAMAN, NP    Brief Narrative:  58 year old with history of alcoholic liver cirrhosis, abstinent for last 3 years presented with about 3 days of lower abdominal pain, multiple loose tarry stool.  Not feeling well, vague complaints of dizziness and lightheadedness.  In the emergency room, hemoglobin 7.6.  Also found to have acute kidney injury.  Admitted with GI consultation and blood transfusions.  Subjective: I examined patient in the morning rounds before going for procedure.  Wrote for 2 more units of blood transfusion with hemoglobin 6.8 after already receiving 2 units.  Patient is poor historian.  He was just tired.  No family at the bedside.   Assessment & Plan:   Acute GI bleeding, coagulopathy due to liver failure.  Suspect upper GI bleeding. Continue supportive measures. Hemoglobin 7.6-2 units of PRBC-6.8-2 units of additional blood transfusion ordered now.  Continue to monitor every 8 hours and transfuse for less than 8. Remains on Protonix  infusion, octreotide .  Poor prognosis.  Decompensated alcoholic liver cirrhosis with gross jaundice Colopathy and thrombocytopenia  Hepatic encephalopathy  Sober for 3 years.  Seen by transplant services, not a transplant candidate. Paracentesis, neutrophil count is 28.  Not likely SBP. On Rocephin  for prophylaxis. Continue lactulose  and rifaximin .  Acute kidney injury, likely hepatorenal syndrome: Lactulose  and rifaximin  to continue. Giving albumin . Close monitoring.  Goal of care: Remains very sick with irreversible illnesses.  Chances of further decompensation. Discussed and updated patient's daughter on the phone. She is going to visit him at the bedside, will meet to discuss further goals of care.   DVT prophylaxis: SCDs Start: 07/06/24 1757   Code Status: Full code, palliative care consulted. Family Communication: Daughter on  the phone. Disposition Plan: Status is: Inpatient Remains inpatient appropriate because: Inpatient procedures, blood transfusions     Consultants:  Gastroenterology Palliative care  Procedures:  Upper GI endoscopy  Antimicrobials:  Rocephin  7/30---     Objective: Vitals:   07/07/24 1220 07/07/24 1230 07/07/24 1233 07/07/24 1259  BP: (!) 95/49 (!) 97/54  (!) 99/46  Pulse: 95 98 93 86  Resp: 15 16 15 16   Temp:    98.4 F (36.9 C)  TempSrc:    Oral  SpO2: 94% 90% 93%     Intake/Output Summary (Last 24 hours) at 07/07/2024 1311 Last data filed at 07/07/2024 1138 Gross per 24 hour  Intake 2265.87 ml  Output 0 ml  Net 2265.87 ml   There were no vitals filed for this visit.  Examination:  General: Chronically sick looking, lethargic.  Icteric. Cardiovascular: S1-S2 normal.  Flow murmur present. Respiratory: Bilateral clear.  Poor inspiratory effort. Gastrointestinal: Soft.  Distended.  Mild tenderness all over.  Bowel sound present. Ext: No swelling or edema. Neuro: Alert on stimulation.  Oriented to himself.  Slow to respond.  Mostly somnolent and sleepy.    Data Reviewed: I have personally reviewed following labs and imaging studies  CBC: Recent Labs  Lab 07/01/24 1124 07/06/24 1602 07/06/24 1638 07/07/24 0805  WBC 6.2 13.0*  --  9.5  NEUTROABS 3.0  --   --   --   HGB 9.9* 7.6* 8.2* 6.8*  HCT 28.0* 22.8* 24.0* 19.7*  MCV 115.7* 118.8*  --  109.4*  PLT 71.0* 55*  --  34*   Basic Metabolic Panel: Recent Labs  Lab 07/01/24 1124 07/06/24 1602 07/06/24 1638 07/07/24 0805  NA 134* 133*  134* 134*  K 3.6 3.7 4.0 3.3*  CL 98 98 98 97*  CO2 28 21*  --  16*  GLUCOSE 106* 86 78 66*  BUN 11 41* 39* 41*  CREATININE 1.42 2.35* 2.40* 2.58*  CALCIUM 8.7 8.3*  --  7.4*  MG  --   --   --  1.8   GFR: Estimated Creatinine Clearance: 35.6 mL/min (A) (by C-G formula based on SCr of 2.58 mg/dL (H)). Liver Function Tests: Recent Labs  Lab 07/01/24 1124  07/06/24 1602 07/07/24 0805  AST 67* 94* 68*  ALT 24 31 26   ALKPHOS 302* 163* 112  BILITOT 15.3* 19.8* 17.7*  PROT 7.2 6.7 8.0  ALBUMIN  2.5* 1.6* 5.2*   Recent Labs  Lab 07/06/24 1602  LIPASE 34   Recent Labs  Lab 07/06/24 1602  AMMONIA 27   Coagulation Profile: Recent Labs  Lab 07/01/24 1124  INR 2.5*   Cardiac Enzymes: No results for input(s): CKTOTAL, CKMB, CKMBINDEX, TROPONINI in the last 168 hours. BNP (last 3 results) No results for input(s): PROBNP in the last 8760 hours. HbA1C: No results for input(s): HGBA1C in the last 72 hours. CBG: Recent Labs  Lab 07/06/24 1556 07/07/24 1101  GLUCAP 87 82   Lipid Profile: No results for input(s): CHOL, HDL, LDLCALC, TRIG, CHOLHDL, LDLDIRECT in the last 72 hours. Thyroid Function Tests: No results for input(s): TSH, T4TOTAL, FREET4, T3FREE, THYROIDAB in the last 72 hours. Anemia Panel: No results for input(s): VITAMINB12, FOLATE, FERRITIN, TIBC, IRON, RETICCTPCT in the last 72 hours. Sepsis Labs: Recent Labs  Lab 07/06/24 1610  LATICACIDVEN 3.5*    Recent Results (from the past 240 hours)  Culture, blood (routine x 2)     Status: None (Preliminary result)   Collection Time: 07/06/24  4:00 PM   Specimen: BLOOD LEFT ARM  Result Value Ref Range Status   Specimen Description BLOOD LEFT ARM  Final   Special Requests   Final    BOTTLES DRAWN AEROBIC AND ANAEROBIC Blood Culture results may not be optimal due to an inadequate volume of blood received in culture bottles   Culture   Final    NO GROWTH < 24 HOURS Performed at [facility], [address], [place], [place] [address]    Report Status PENDING  Incomplete  Culture, blood (routine x 2)     Status: None (Preliminary result)   Collection Time: 07/06/24  4:29 PM   Specimen: BLOOD RIGHT HAND  Result Value Ref Range Status   Specimen Description BLOOD RIGHT HAND  Final   Special Requests   Final     BOTTLES DRAWN AEROBIC AND ANAEROBIC Blood Culture adequate volume   Culture   Final    NO GROWTH < 24 HOURS Performed at [facility], [address] [place], [place] [address]    Report Status PENDING  Incomplete  Peritoneal fluid culture w Gram Stain     Status: None (Preliminary result)   Collection Time: 07/06/24  6:44 PM   Specimen: Peritoneal Fluid  Result Value Ref Range Status   Specimen Description FLUID PERITONEAL  Final   Special Requests NONE  Final   Gram Stain NO WBC SEEN NO ORGANISMS SEEN   Final   Culture   Final    NO GROWTH < 24 HOURS Performed at [facility], [address] [place], [place] [address]    Report Status PENDING  Incomplete  Radiology Studies: US  RENAL Result Date: 07/06/2024 CLINICAL DATA:  Acute kidney injury EXAM: RENAL / URINARY TRACT ULTRASOUND COMPLETE COMPARISON:  CT abdomen and pelvis 05/23/2024 FINDINGS: Right Kidney: Renal measurements: 9.9 x 4.5 x 4.5 cm = volume: 103 mL. Echogenicity within normal limits. No mass or hydronephrosis visualized. Left Kidney: Renal measurements: 9.9 x 5.1 x 4.2 cm = volume: 110 mL. Echogenicity within normal limits. No mass or hydronephrosis visualized. Bladder: Appears normal for degree of bladder distention. Other: Moderate amount of ascites present. IMPRESSION: 1. No hydronephrosis. 2. Moderate amount of ascites. Electronically Signed   By: Greig Pique M.D.   On: 07/06/2024 19:51   DG Chest Port 1 View Result Date: 07/06/2024 EXAM: 1 VIEW XRAY OF THE CHEST 07/06/2024 05:34:00 PM COMPARISON: 10/08/2023 CLINICAL HISTORY: Sepsis r/o. Reason for exam: R/O sepsis; Per triage: Pt arrives via EMS from home. Pt reports being incontinent of both bowel and bladder for the past couple of days, increased weakness, decreased appetite, dizziness, abdominal pain, and a recent fall. AxOx4. Pt is very jaundiced. FINDINGS: LUNGS AND PLEURA: Elevated right hemidiaphragm with right basilar atelectasis or  infiltrates. Left lung is clear. No pleural effusion or pneumothorax. HEART AND MEDIASTINUM: Stable cardiomediastinal silhouette. BONES AND SOFT TISSUES: No acute osseous abnormality. IMPRESSION: 1. Right basilar atelectasis or infiltrates. Electronically signed by: Norman Gatlin MD 07/06/2024 05:39 PM EDT RP Workstation: [id number]   DG Ankle Complete Left Result Date: 07/06/2024 CLINICAL DATA:  Left ankle pain. EXAM: LEFT ANKLE COMPLETE - 3+ VIEW COMPARISON:  None Available. FINDINGS: No acute fracture or dislocation. The bones are osteopenic. The ankle mortise is intact. The soft tissues unremarkable. IMPRESSION: 1. No acute fracture or dislocation. 2. Osteopenia. Electronically Signed   By: Vanetta Chou M.D.   On: 07/06/2024 16:43        Scheduled Meds:  folic acid   1 mg Oral Daily   lactulose   20 g Oral TID   midodrine   10 mg Oral TID WC   multivitamin with minerals  1 tablet Oral Daily   Na Sulfate-K Sulfate-Mg Sulfate concentrate  0.5 kit Oral Once   Followed by   Na Sulfate-K Sulfate-Mg Sulfate concentrate  0.5 kit Oral Once   pantoprazole  (PROTONIX ) IV  40 mg Intravenous Q12H   rifaximin   550 mg Oral BID   simethicone   240 mg Oral Once   sodium chloride  flush  3 mL Intravenous Q12H   thiamine   100 mg Oral Daily   Continuous Infusions:  sodium chloride  250 mL (07/07/24 1010)   sodium chloride      albumin  human 50 g (07/07/24 1303)   cefTRIAXone  (ROCEPHIN )  IV     octreotide  (SANDOSTATIN ) 500 mcg in sodium chloride  0.9 % 250 mL (2 mcg/mL) infusion 50 mcg/hr (07/07/24 1111)   phytonadione  (VITAMIN K ) 5 mg in dextrose  5 % 50 mL IVPB 5 mg (07/07/24 1035)     LOS: 1 day    Time spent: 55 minutes    Renato Applebaum, MD [facility]

## 2024-07-07 NOTE — Plan of Care (Signed)
   Problem: Safety: Goal: Ability to remain free from injury will improve Outcome: Progressing

## 2024-07-08 LAB — TYPE AND SCREEN
ABO/RH(D): O POS
Antibody Screen: NEGATIVE
Unit division: 0
Unit division: 0
Unit division: 0
Unit division: 0

## 2024-07-08 LAB — BPAM RBC
Blood Product Expiration Date: 202508262359
Blood Product Expiration Date: 202508262359
Blood Product Expiration Date: 202508262359
ISSUE DATE / TIME: 202507311006
ISSUE DATE / TIME: 202507311256
ISSUE DATE / TIME: 202507302115
ISSUE DATE / TIME: 202507310158
ISSUE DATE / TIME: 202508262359
Unit Type and Rh: 5100
Unit Type and Rh: 5100
Unit Type and Rh: 202508262359
Unit Type and Rh: 202508262359
Unit Type and Rh: 5100
Unit Type and Rh: 5100

## 2024-07-08 SURGERY — COLONOSCOPY
Anesthesia: Monitor Anesthesia Care

## 2024-07-08 DEATH — deceased

## 2024-07-09 ENCOUNTER — Other Ambulatory Visit (HOSPITAL_COMMUNITY): Payer: Self-pay

## 2024-07-09 LAB — TOTAL BILIRUBIN, BODY FLUID: Total bilirubin, fluid: 1.2 mg/dL

## 2024-07-10 ENCOUNTER — Encounter (HOSPITAL_COMMUNITY): Payer: Self-pay | Admitting: Gastroenterology

## 2024-07-10 LAB — BODY FLUID CULTURE W GRAM STAIN
Culture: NO GROWTH
Gram Stain: NONE SEEN

## 2024-07-11 LAB — CULTURE, BLOOD (ROUTINE X 2)
Culture: NO GROWTH
Special Requests: ADEQUATE

## 2024-07-12 LAB — CYTOLOGY - NON PAP

## 2024-07-13 LAB — CULTURE, BLOOD (ROUTINE X 2): Culture  Setup Time: NO GROWTH

## 2024-07-21 ENCOUNTER — Other Ambulatory Visit (HOSPITAL_COMMUNITY): Payer: Self-pay

## 2024-08-08 NOTE — Progress Notes (Signed)
 Telemetry called to inform RN that patient's heart rate had dropped in the 40's. RN responded to assess patient. RN found patient to be agonal breathing with no palpable carotid pulse. Bag valve mask placed on patient to assist with respiratory efforts. Dr. Franky paged by RN and informed of patient condition. MD responded to bedside to pronounce time of death at 92.

## 2024-08-08 NOTE — Progress Notes (Signed)
   2024-07-26 0400  Spiritual Encounters  Type of Visit Initial;Attempt (pt unavailable)  Conversation partners present during encounter Nurse  OnCall Visit Yes   Chaplain was paged to Rapid Response. Chaplain was present, but patient was unavailable. RN stated that no need chaplain's presence at the time. Chaplain will be available if needed.   M.Kubra Susanna Kerry Resident [phone]

## 2024-08-08 NOTE — Telephone Encounter (Signed)
Patient passed away this AM.

## 2024-08-08 NOTE — Plan of Care (Signed)

## 2024-08-08 NOTE — Death Summary Note (Signed)
 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 Not feeling well, vague complaints of dizziness and lightheadedness. In the emergency room, hemoglobin 7.6. Also found to have acute kidney injury. Admitted with GI consultation and blood transfusions.   Acute GI bleeding secondary to coagulopathy from liver failure: Patient was admitted to the hospital.  Given 4 units of PRBC.  Vitamin K .  Upper GI endoscopy without any active bleeding.  Decompensated alcoholic liver cirrhosis with gross jaundice Coagulopathy and thrombocytopenia Hepatic encephalopathy Hepatorenal syndrome  Patient was treated symptomatically.  He had very poor prognosis and was in the hospital for symptomatic management.  While under active treatment, patient died peacefully.       Procedures: Endoscopy  Consultations: Gastroenterology, palliative care  The results of significant diagnostics from this hospitalization (including imaging, microbiology, ancillary and laboratory) are listed below for reference.   Significant Diagnostic Studies: US  RENAL Result Date: 09-Jul-2024 CLINICAL DATA:  Acute kidney injury EXAM: RENAL / URINARY TRACT ULTRASOUND  COMPLETE COMPARISON:  CT abdomen and pelvis 05/23/2024 FINDINGS: Right Kidney: Renal measurements: 9.9 x 4.5 x 4.5 cm = volume: 103 mL. Echogenicity within normal limits. No mass or hydronephrosis visualized. Left Kidney: Renal measurements: 9.9 x 5.1 x 4.2 cm = volume: 110 mL. Echogenicity within normal limits. No mass or hydronephrosis visualized. Bladder: Appears normal for degree of bladder distention. Other: Moderate amount of ascites present. IMPRESSION: 1. No hydronephrosis. 2. Moderate amount of ascites. Electronically Signed   By: Greig Pique M.D.   On: Jul 09, 2024 19:51   DG Chest Port 1 View Result Date: Jul 09, 2024 EXAM: 1 VIEW XRAY OF THE CHEST July 09, 2024 05:34:00 PM COMPARISON: 10/08/2023 CLINICAL HISTORY: Sepsis r/o. Reason for exam: R/O sepsis; Per triage: Pt arrives via EMS from home. Pt reports being incontinent of both bowel and bladder for the past couple of days, increased weakness, decreased appetite, dizziness, abdominal pain, and a recent fall. AxOx4. Pt is very jaundiced. FINDINGS: LUNGS AND PLEURA: Elevated right hemidiaphragm with right basilar atelectasis or infiltrates. Left lung is clear. No pleural effusion or pneumothorax. HEART AND MEDIASTINUM: Stable cardiomediastinal silhouette. BONES AND SOFT TISSUES: No acute osseous abnormality. IMPRESSION: 1. Right basilar atelectasis or infiltrates. Electronically signed by: Norman Gatlin MD 07-09-24 05:39 PM EDT RP Workstation: [id number]   DG Ankle Complete Left Result Date: 07-09-24 CLINICAL DATA:  Left ankle pain. EXAM: LEFT ANKLE COMPLETE - 3+ VIEW COMPARISON:  None Available. FINDINGS: No acute fracture or dislocation. The bones are osteopenic. The ankle mortise is intact. The soft tissues unremarkable. IMPRESSION: 1. No acute fracture or dislocation. 2. Osteopenia. Electronically Signed   By: Vanetta Chou M.D.   On: 2024-07-09 16:43   IR Paracentesis Result Date: 06/24/2024 INDICATION: Patient with a history of  cirrhosis  with recurrent ascites. Interventional radiology asked to perform a diagnostic and therapeutic paracentesis with a 5 L max. EXAM: ULTRASOUND GUIDED PARACENTESIS MEDICATIONS: 1% lidocaine  with epinephrine , 15 mL COMPLICATIONS: None immediate. PROCEDURE: Informed written consent was obtained from the patient after a discussion of the risks, benefits and alternatives to treatment. A timeout was performed prior to the initiation of the procedure. Initial ultrasound scanning demonstrates a large amount of ascites within the right lower abdominal quadrant. The right lower abdomen was prepped and draped in the usual sterile fashion. 1% lidocaine  was used for local anesthesia. Following this, a 19 gauge, 7-cm, Yueh catheter was introduced. An ultrasound image was saved for documentation purposes. The paracentesis was performed. The catheter was removed and a dressing was applied. The patient tolerated the procedure well without immediate post procedural complication. FINDINGS: A total of approximately 5 L of clear yellow fluid was removed. Samples were sent to the laboratory as requested by the clinical team. IMPRESSION: Successful ultrasound-guided paracentesis yielding 5 liters of peritoneal fluid. Procedure performed by Warren Dais, NP PLAN: If the patient eventually requires >/=2 paracenteses in a 30 day period, candidacy for formal evaluation by the [facility] will be assessed. Patient was undergoing workup for liver transplant but is currently not a candidate due to continued tobacco use. Patient's last paracentesis was little over a month ago. Electronically Signed   By: JONETTA Faes M.D.   On: 06/24/2024 16:59    Microbiology: Recent Results (from the past 240 hours)  Culture, blood (routine x 2)     Status: None (Preliminary result)   Collection Time: 07/06/24  4:00 PM   Specimen: BLOOD LEFT ARM  Result Value Ref Range Status   Specimen  Description BLOOD LEFT ARM  Final   Special Requests   Final    BOTTLES DRAWN AEROBIC AND ANAEROBIC Blood Culture results may not be optimal due to an inadequate volume of blood received in culture bottles   Culture   Final    NO GROWTH 2 DAYS Performed at [facility], [address] [place], [place] [address]    Report Status PENDING  Incomplete  Culture, blood (routine x 2)     Status: None (Preliminary result)   Collection Time: 07/06/24  4:29 PM   Specimen: BLOOD RIGHT HAND  Result Value Ref Range Status   Specimen Description BLOOD RIGHT HAND  Final   Special Requests   Final    BOTTLES DRAWN AEROBIC AND ANAEROBIC Blood Culture adequate volume   Culture   Final    NO GROWTH 2 DAYS Performed at [facility], [address] [place], [place] [address]    Report Status PENDING  Incomplete  Peritoneal fluid culture w Gram Stain     Status: None (Preliminary result)   Collection Time: 07/06/24  6:44 PM   Specimen: Peritoneal Fluid  Result Value Ref Range Status   Specimen Description FLUID PERITONEAL  Final   Special Requests NONE  Final   Gram Stain NO WBC SEEN NO ORGANISMS SEEN   Final   Culture   Final    NO GROWTH 2 DAYS Performed at [facility], [address], [place], [place] [address]    Report Status PENDING  Incomplete    Time spent: 0 minutes  Signed: Renato Applebaum, MD 07-31-24

## 2024-08-08 NOTE — Progress Notes (Signed)
 Patient is refusing to drink the remainder of the bowel prep despite reeducation and frequent reminders from RN. RN explained to patient risks associated with refusing bowel prep. Patient continues to refuse. RN will notify endoscopy department of patient's refusal. MAR administration updated.

## 2024-08-08 DEATH — deceased

## 2024-08-17 ENCOUNTER — Ambulatory Visit: Payer: Self-pay | Admitting: Nurse Practitioner
